# Patient Record
Sex: Female | Born: 1960
Health system: Southern US, Community
[De-identification: ages and names within clinical notes are randomized; demographics above are authoritative.]

## PROBLEM LIST (undated history)

## (undated) DIAGNOSIS — C329 Malignant neoplasm of larynx, unspecified: Secondary | ICD-10-CM

## (undated) DIAGNOSIS — C3431 Malignant neoplasm of lower lobe, right bronchus or lung: Secondary | ICD-10-CM

## (undated) DIAGNOSIS — F32A Depression, unspecified: Secondary | ICD-10-CM

## (undated) DIAGNOSIS — G43909 Migraine, unspecified, not intractable, without status migrainosus: Secondary | ICD-10-CM

## (undated) DIAGNOSIS — G8929 Other chronic pain: Secondary | ICD-10-CM

## (undated) DIAGNOSIS — R32 Unspecified urinary incontinence: Secondary | ICD-10-CM

## (undated) DIAGNOSIS — I10 Essential (primary) hypertension: Secondary | ICD-10-CM

## (undated) DIAGNOSIS — R238 Other skin changes: Secondary | ICD-10-CM

## (undated) DIAGNOSIS — M1990 Unspecified osteoarthritis, unspecified site: Secondary | ICD-10-CM

## (undated) DIAGNOSIS — T148XXA Other injury of unspecified body region, initial encounter: Secondary | ICD-10-CM

## (undated) DIAGNOSIS — K649 Unspecified hemorrhoids: Secondary | ICD-10-CM

## (undated) DIAGNOSIS — D649 Anemia, unspecified: Secondary | ICD-10-CM

## (undated) DIAGNOSIS — R0600 Dyspnea, unspecified: Secondary | ICD-10-CM

## (undated) DIAGNOSIS — G3184 Mild cognitive impairment, so stated: Secondary | ICD-10-CM

## (undated) DIAGNOSIS — C801 Malignant (primary) neoplasm, unspecified: Secondary | ICD-10-CM

## (undated) DIAGNOSIS — G479 Sleep disorder, unspecified: Secondary | ICD-10-CM

## (undated) DIAGNOSIS — J45909 Unspecified asthma, uncomplicated: Secondary | ICD-10-CM

## (undated) DIAGNOSIS — G43009 Migraine without aura, not intractable, without status migrainosus: Secondary | ICD-10-CM

## (undated) DIAGNOSIS — M79606 Pain in leg, unspecified: Secondary | ICD-10-CM

## (undated) DIAGNOSIS — K5792 Diverticulitis of intestine, part unspecified, without perforation or abscess without bleeding: Secondary | ICD-10-CM

## (undated) DIAGNOSIS — Z972 Presence of dental prosthetic device (complete) (partial): Secondary | ICD-10-CM

## (undated) DIAGNOSIS — F419 Anxiety disorder, unspecified: Secondary | ICD-10-CM

## (undated) DIAGNOSIS — E785 Hyperlipidemia, unspecified: Secondary | ICD-10-CM

## (undated) DIAGNOSIS — C3490 Malignant neoplasm of unspecified part of unspecified bronchus or lung: Secondary | ICD-10-CM

## (undated) DIAGNOSIS — J449 Chronic obstructive pulmonary disease, unspecified: Secondary | ICD-10-CM

## (undated) DIAGNOSIS — R233 Spontaneous ecchymoses: Secondary | ICD-10-CM

## (undated) DIAGNOSIS — F329 Major depressive disorder, single episode, unspecified: Secondary | ICD-10-CM

## (undated) DIAGNOSIS — Z87442 Personal history of urinary calculi: Secondary | ICD-10-CM

## (undated) DIAGNOSIS — R911 Solitary pulmonary nodule: Secondary | ICD-10-CM

## (undated) DIAGNOSIS — Z8614 Personal history of Methicillin resistant Staphylococcus aureus infection: Secondary | ICD-10-CM

## (undated) DIAGNOSIS — M47816 Spondylosis without myelopathy or radiculopathy, lumbar region: Secondary | ICD-10-CM

## (undated) DIAGNOSIS — I499 Cardiac arrhythmia, unspecified: Secondary | ICD-10-CM

## (undated) DIAGNOSIS — K219 Gastro-esophageal reflux disease without esophagitis: Secondary | ICD-10-CM

## (undated) DIAGNOSIS — R0902 Hypoxemia: Secondary | ICD-10-CM

## (undated) DIAGNOSIS — M549 Dorsalgia, unspecified: Secondary | ICD-10-CM

## (undated) HISTORY — DX: Other injury of unspecified body region, initial encounter: T14.8XXA

## (undated) HISTORY — DX: Unspecified hemorrhoids: K64.9

## (undated) HISTORY — PX: TONSILLECTOMY: SUR1361

## (undated) HISTORY — DX: Spondylosis without myelopathy or radiculopathy, lumbar region: M47.816

## (undated) HISTORY — DX: Migraine without aura, not intractable, without status migrainosus: G43.009

## (undated) HISTORY — PX: JOINT REPLACEMENT: SHX530

## (undated) HISTORY — DX: Unspecified asthma, uncomplicated: J45.909

## (undated) HISTORY — DX: Hyperlipidemia, unspecified: E78.5

## (undated) HISTORY — DX: Migraine, unspecified, not intractable, without status migrainosus: G43.909

## (undated) HISTORY — DX: Diverticulitis of intestine, part unspecified, without perforation or abscess without bleeding: K57.92

## (undated) HISTORY — DX: Solitary pulmonary nodule: R91.1

## (undated) HISTORY — PX: COLONOSCOPY: SHX5424

## (undated) HISTORY — DX: Essential (primary) hypertension: I10

## (undated) HISTORY — DX: Personal history of Methicillin resistant Staphylococcus aureus infection: Z86.14

## (undated) HISTORY — DX: Malignant neoplasm of larynx, unspecified: C32.9

## (undated) HISTORY — DX: Mild cognitive impairment, so stated: G31.84

---

## 1981-10-19 HISTORY — PX: ABDOMINAL HYSTERECTOMY: SHX81

## 1996-10-19 HISTORY — PX: CHOLECYSTECTOMY: SHX55

## 2003-09-28 ENCOUNTER — Other Ambulatory Visit: Payer: Self-pay

## 2004-08-04 ENCOUNTER — Ambulatory Visit: Payer: Self-pay | Admitting: Pain Medicine

## 2004-09-02 ENCOUNTER — Ambulatory Visit: Payer: Self-pay | Admitting: Pain Medicine

## 2004-09-24 ENCOUNTER — Ambulatory Visit: Payer: Self-pay | Admitting: Pain Medicine

## 2004-10-30 ENCOUNTER — Ambulatory Visit: Payer: Self-pay | Admitting: Pain Medicine

## 2004-11-12 ENCOUNTER — Ambulatory Visit: Payer: Self-pay | Admitting: Pain Medicine

## 2004-11-27 ENCOUNTER — Ambulatory Visit: Payer: Self-pay | Admitting: Pain Medicine

## 2004-12-01 ENCOUNTER — Ambulatory Visit: Payer: Self-pay | Admitting: Pain Medicine

## 2004-12-24 ENCOUNTER — Ambulatory Visit: Payer: Self-pay | Admitting: Pain Medicine

## 2005-01-21 ENCOUNTER — Ambulatory Visit: Payer: Self-pay | Admitting: Pain Medicine

## 2005-02-03 ENCOUNTER — Ambulatory Visit: Payer: Self-pay | Admitting: Pain Medicine

## 2005-02-17 ENCOUNTER — Ambulatory Visit: Payer: Self-pay | Admitting: Pain Medicine

## 2005-02-25 ENCOUNTER — Ambulatory Visit: Payer: Self-pay | Admitting: Pain Medicine

## 2005-03-31 ENCOUNTER — Ambulatory Visit: Payer: Self-pay | Admitting: Pain Medicine

## 2005-04-06 ENCOUNTER — Ambulatory Visit: Payer: Self-pay | Admitting: Pain Medicine

## 2005-04-09 ENCOUNTER — Emergency Department: Payer: Self-pay | Admitting: General Practice

## 2005-04-10 ENCOUNTER — Other Ambulatory Visit: Payer: Self-pay

## 2005-04-28 ENCOUNTER — Ambulatory Visit: Payer: Self-pay | Admitting: Pain Medicine

## 2005-05-06 ENCOUNTER — Ambulatory Visit: Payer: Self-pay | Admitting: Pain Medicine

## 2005-05-28 ENCOUNTER — Ambulatory Visit: Payer: Self-pay | Admitting: Pain Medicine

## 2005-06-08 ENCOUNTER — Ambulatory Visit: Payer: Self-pay | Admitting: Pain Medicine

## 2005-07-21 ENCOUNTER — Ambulatory Visit: Payer: Self-pay | Admitting: Pain Medicine

## 2005-07-27 ENCOUNTER — Ambulatory Visit: Payer: Self-pay | Admitting: Pain Medicine

## 2005-08-20 ENCOUNTER — Ambulatory Visit: Payer: Self-pay | Admitting: Pain Medicine

## 2005-08-26 ENCOUNTER — Ambulatory Visit: Payer: Self-pay | Admitting: Pain Medicine

## 2005-09-03 ENCOUNTER — Ambulatory Visit: Payer: Self-pay | Admitting: Pain Medicine

## 2005-09-28 ENCOUNTER — Ambulatory Visit: Payer: Self-pay | Admitting: Pain Medicine

## 2005-10-29 ENCOUNTER — Ambulatory Visit: Payer: Self-pay | Admitting: Pain Medicine

## 2005-11-04 ENCOUNTER — Emergency Department: Payer: Self-pay | Admitting: Unknown Physician Specialty

## 2005-11-04 ENCOUNTER — Ambulatory Visit: Payer: Self-pay | Admitting: Pain Medicine

## 2005-11-04 ENCOUNTER — Other Ambulatory Visit: Payer: Self-pay

## 2005-11-14 ENCOUNTER — Emergency Department: Payer: Self-pay | Admitting: Emergency Medicine

## 2005-11-15 ENCOUNTER — Other Ambulatory Visit: Payer: Self-pay

## 2005-11-24 ENCOUNTER — Ambulatory Visit: Payer: Self-pay | Admitting: Pain Medicine

## 2005-11-30 ENCOUNTER — Ambulatory Visit: Payer: Self-pay | Admitting: Pain Medicine

## 2006-01-13 ENCOUNTER — Inpatient Hospital Stay: Payer: Self-pay | Admitting: Psychiatry

## 2006-01-29 ENCOUNTER — Encounter: Payer: Self-pay | Admitting: Psychiatry

## 2006-02-04 ENCOUNTER — Ambulatory Visit: Payer: Self-pay | Admitting: Pain Medicine

## 2006-02-15 ENCOUNTER — Ambulatory Visit: Payer: Self-pay | Admitting: Pain Medicine

## 2006-03-10 ENCOUNTER — Ambulatory Visit: Payer: Self-pay | Admitting: Pain Medicine

## 2006-03-24 ENCOUNTER — Ambulatory Visit: Payer: Self-pay | Admitting: Pain Medicine

## 2006-04-14 ENCOUNTER — Emergency Department: Payer: Self-pay | Admitting: Emergency Medicine

## 2006-04-17 ENCOUNTER — Observation Stay: Payer: Self-pay | Admitting: Internal Medicine

## 2006-06-07 ENCOUNTER — Ambulatory Visit: Payer: Self-pay | Admitting: Pain Medicine

## 2006-06-28 ENCOUNTER — Emergency Department: Payer: Self-pay | Admitting: Emergency Medicine

## 2006-07-13 ENCOUNTER — Ambulatory Visit: Payer: Self-pay | Admitting: Pain Medicine

## 2006-07-28 ENCOUNTER — Ambulatory Visit: Payer: Self-pay | Admitting: Pain Medicine

## 2006-08-24 ENCOUNTER — Ambulatory Visit: Payer: Self-pay | Admitting: Pain Medicine

## 2006-08-30 ENCOUNTER — Ambulatory Visit: Payer: Self-pay | Admitting: Pain Medicine

## 2006-09-21 ENCOUNTER — Ambulatory Visit: Payer: Self-pay | Admitting: Pain Medicine

## 2006-09-27 ENCOUNTER — Ambulatory Visit: Payer: Self-pay | Admitting: Pain Medicine

## 2006-10-14 ENCOUNTER — Emergency Department: Payer: Self-pay | Admitting: Emergency Medicine

## 2006-10-21 ENCOUNTER — Ambulatory Visit: Payer: Self-pay | Admitting: Pain Medicine

## 2006-10-23 ENCOUNTER — Emergency Department: Payer: Self-pay | Admitting: General Practice

## 2006-10-27 ENCOUNTER — Ambulatory Visit: Payer: Self-pay | Admitting: Pain Medicine

## 2006-11-18 ENCOUNTER — Ambulatory Visit: Payer: Self-pay | Admitting: Pain Medicine

## 2006-11-29 ENCOUNTER — Ambulatory Visit: Payer: Self-pay | Admitting: Pain Medicine

## 2006-12-10 ENCOUNTER — Emergency Department: Payer: Self-pay | Admitting: General Practice

## 2006-12-16 ENCOUNTER — Emergency Department: Payer: Self-pay | Admitting: Emergency Medicine

## 2006-12-16 ENCOUNTER — Ambulatory Visit: Payer: Self-pay | Admitting: Pain Medicine

## 2006-12-29 ENCOUNTER — Ambulatory Visit: Payer: Self-pay | Admitting: Pain Medicine

## 2007-01-13 ENCOUNTER — Ambulatory Visit: Payer: Self-pay | Admitting: Pain Medicine

## 2007-01-19 ENCOUNTER — Ambulatory Visit: Payer: Self-pay | Admitting: Pain Medicine

## 2007-02-15 ENCOUNTER — Ambulatory Visit: Payer: Self-pay | Admitting: Pain Medicine

## 2007-02-21 ENCOUNTER — Ambulatory Visit: Payer: Self-pay | Admitting: Pain Medicine

## 2007-03-17 ENCOUNTER — Ambulatory Visit: Payer: Self-pay | Admitting: Pain Medicine

## 2007-03-23 ENCOUNTER — Ambulatory Visit: Payer: Self-pay | Admitting: Pain Medicine

## 2007-04-14 ENCOUNTER — Ambulatory Visit: Payer: Self-pay | Admitting: Pain Medicine

## 2007-04-18 ENCOUNTER — Ambulatory Visit: Payer: Self-pay | Admitting: Pain Medicine

## 2007-05-03 ENCOUNTER — Emergency Department: Payer: Self-pay | Admitting: Emergency Medicine

## 2007-05-09 ENCOUNTER — Emergency Department: Payer: Self-pay | Admitting: Emergency Medicine

## 2007-05-17 ENCOUNTER — Ambulatory Visit: Payer: Self-pay | Admitting: Pain Medicine

## 2007-05-18 ENCOUNTER — Ambulatory Visit: Payer: Self-pay | Admitting: Pain Medicine

## 2007-05-21 ENCOUNTER — Emergency Department: Payer: Self-pay | Admitting: Emergency Medicine

## 2007-05-26 ENCOUNTER — Emergency Department: Payer: Self-pay | Admitting: Internal Medicine

## 2007-06-16 ENCOUNTER — Ambulatory Visit: Payer: Self-pay | Admitting: Pain Medicine

## 2007-06-22 ENCOUNTER — Emergency Department: Payer: Self-pay | Admitting: Emergency Medicine

## 2007-07-13 ENCOUNTER — Ambulatory Visit: Payer: Self-pay | Admitting: Pain Medicine

## 2007-07-25 ENCOUNTER — Ambulatory Visit: Payer: Self-pay | Admitting: Pain Medicine

## 2007-08-11 ENCOUNTER — Ambulatory Visit: Payer: Self-pay | Admitting: Pain Medicine

## 2007-08-22 ENCOUNTER — Ambulatory Visit: Payer: Self-pay | Admitting: Pain Medicine

## 2007-09-13 ENCOUNTER — Ambulatory Visit: Payer: Self-pay | Admitting: Pain Medicine

## 2007-09-21 ENCOUNTER — Ambulatory Visit: Payer: Self-pay | Admitting: Pain Medicine

## 2007-10-20 DIAGNOSIS — C329 Malignant neoplasm of larynx, unspecified: Secondary | ICD-10-CM

## 2007-10-20 HISTORY — DX: Malignant neoplasm of larynx, unspecified: C32.9

## 2007-10-26 ENCOUNTER — Ambulatory Visit: Payer: Self-pay | Admitting: Pain Medicine

## 2007-11-24 ENCOUNTER — Ambulatory Visit: Payer: Self-pay | Admitting: Pain Medicine

## 2007-11-28 ENCOUNTER — Ambulatory Visit: Payer: Self-pay | Admitting: Pain Medicine

## 2007-12-22 ENCOUNTER — Ambulatory Visit: Payer: Self-pay | Admitting: Pain Medicine

## 2007-12-28 ENCOUNTER — Ambulatory Visit: Payer: Self-pay | Admitting: Pain Medicine

## 2008-01-15 ENCOUNTER — Other Ambulatory Visit: Payer: Self-pay

## 2008-01-15 ENCOUNTER — Emergency Department: Payer: Self-pay | Admitting: Emergency Medicine

## 2008-01-23 ENCOUNTER — Ambulatory Visit: Payer: Self-pay | Admitting: Pain Medicine

## 2008-02-12 ENCOUNTER — Ambulatory Visit: Payer: Self-pay | Admitting: Internal Medicine

## 2008-02-15 ENCOUNTER — Ambulatory Visit: Payer: Self-pay | Admitting: Gastroenterology

## 2008-02-23 ENCOUNTER — Ambulatory Visit: Payer: Self-pay | Admitting: Pain Medicine

## 2008-02-29 ENCOUNTER — Ambulatory Visit: Payer: Self-pay | Admitting: Pain Medicine

## 2008-03-15 ENCOUNTER — Ambulatory Visit: Payer: Self-pay | Admitting: Pain Medicine

## 2008-03-21 ENCOUNTER — Ambulatory Visit: Payer: Self-pay | Admitting: Pain Medicine

## 2008-04-01 ENCOUNTER — Other Ambulatory Visit: Payer: Self-pay

## 2008-04-01 ENCOUNTER — Emergency Department: Payer: Self-pay | Admitting: Emergency Medicine

## 2008-04-02 ENCOUNTER — Other Ambulatory Visit: Payer: Self-pay

## 2008-04-02 ENCOUNTER — Emergency Department: Payer: Self-pay | Admitting: Emergency Medicine

## 2008-04-15 ENCOUNTER — Emergency Department: Payer: Self-pay | Admitting: Emergency Medicine

## 2008-04-17 ENCOUNTER — Ambulatory Visit: Payer: Self-pay | Admitting: Pain Medicine

## 2008-04-17 ENCOUNTER — Ambulatory Visit: Payer: Self-pay | Admitting: Internal Medicine

## 2008-04-19 ENCOUNTER — Ambulatory Visit: Payer: Self-pay | Admitting: Internal Medicine

## 2008-04-25 ENCOUNTER — Ambulatory Visit: Payer: Self-pay | Admitting: Pain Medicine

## 2008-05-15 ENCOUNTER — Ambulatory Visit: Payer: Self-pay | Admitting: Pain Medicine

## 2008-05-16 ENCOUNTER — Ambulatory Visit: Payer: Self-pay | Admitting: Pain Medicine

## 2008-05-24 ENCOUNTER — Other Ambulatory Visit: Payer: Self-pay

## 2008-05-24 ENCOUNTER — Emergency Department: Payer: Self-pay | Admitting: Emergency Medicine

## 2008-06-14 ENCOUNTER — Ambulatory Visit: Payer: Self-pay | Admitting: Pain Medicine

## 2008-06-18 ENCOUNTER — Ambulatory Visit: Payer: Self-pay | Admitting: Pain Medicine

## 2008-07-17 ENCOUNTER — Ambulatory Visit: Payer: Self-pay | Admitting: Pain Medicine

## 2008-07-18 ENCOUNTER — Ambulatory Visit: Payer: Self-pay | Admitting: Pain Medicine

## 2008-08-16 ENCOUNTER — Ambulatory Visit: Payer: Self-pay | Admitting: Pain Medicine

## 2008-08-20 ENCOUNTER — Ambulatory Visit: Payer: Self-pay | Admitting: Pain Medicine

## 2008-09-10 ENCOUNTER — Ambulatory Visit: Payer: Self-pay | Admitting: Pain Medicine

## 2008-10-17 ENCOUNTER — Ambulatory Visit: Payer: Self-pay | Admitting: Pain Medicine

## 2008-11-14 ENCOUNTER — Ambulatory Visit: Payer: Self-pay | Admitting: Pain Medicine

## 2008-11-27 ENCOUNTER — Emergency Department: Payer: Self-pay | Admitting: Internal Medicine

## 2008-12-03 ENCOUNTER — Ambulatory Visit: Payer: Self-pay | Admitting: Pain Medicine

## 2008-12-12 ENCOUNTER — Ambulatory Visit: Payer: Self-pay | Admitting: Pain Medicine

## 2008-12-24 ENCOUNTER — Ambulatory Visit: Payer: Self-pay | Admitting: Pain Medicine

## 2009-01-27 ENCOUNTER — Inpatient Hospital Stay: Payer: Self-pay | Admitting: Internal Medicine

## 2009-01-29 ENCOUNTER — Inpatient Hospital Stay: Payer: Self-pay | Admitting: Unknown Physician Specialty

## 2009-02-15 ENCOUNTER — Inpatient Hospital Stay: Payer: Self-pay | Admitting: Psychiatry

## 2009-02-26 ENCOUNTER — Ambulatory Visit: Payer: Self-pay | Admitting: Unknown Physician Specialty

## 2009-03-07 ENCOUNTER — Ambulatory Visit: Payer: Self-pay | Admitting: Unknown Physician Specialty

## 2009-03-10 ENCOUNTER — Observation Stay: Payer: Self-pay | Admitting: Psychiatry

## 2009-04-29 ENCOUNTER — Inpatient Hospital Stay: Payer: Self-pay | Admitting: Internal Medicine

## 2009-04-30 ENCOUNTER — Inpatient Hospital Stay: Payer: Self-pay | Admitting: Unknown Physician Specialty

## 2009-05-12 ENCOUNTER — Emergency Department: Payer: Self-pay | Admitting: Emergency Medicine

## 2009-05-23 ENCOUNTER — Emergency Department: Payer: Self-pay | Admitting: Emergency Medicine

## 2009-05-29 ENCOUNTER — Inpatient Hospital Stay: Payer: Self-pay | Admitting: Internal Medicine

## 2009-06-03 ENCOUNTER — Emergency Department: Payer: Self-pay | Admitting: Emergency Medicine

## 2009-07-23 ENCOUNTER — Ambulatory Visit: Payer: Self-pay | Admitting: Orthopedic Surgery

## 2009-08-18 ENCOUNTER — Emergency Department: Payer: Self-pay | Admitting: Emergency Medicine

## 2009-09-16 ENCOUNTER — Ambulatory Visit: Payer: Self-pay | Admitting: Orthopedic Surgery

## 2009-09-26 ENCOUNTER — Inpatient Hospital Stay: Payer: Self-pay | Admitting: Orthopedic Surgery

## 2009-12-17 ENCOUNTER — Ambulatory Visit: Payer: Self-pay | Admitting: Internal Medicine

## 2009-12-26 ENCOUNTER — Ambulatory Visit: Payer: Self-pay | Admitting: Otolaryngology

## 2010-01-01 ENCOUNTER — Ambulatory Visit: Payer: Self-pay | Admitting: Otolaryngology

## 2010-01-10 ENCOUNTER — Ambulatory Visit: Payer: Self-pay | Admitting: Internal Medicine

## 2010-01-13 ENCOUNTER — Ambulatory Visit: Payer: Self-pay | Admitting: Vascular Surgery

## 2010-01-15 ENCOUNTER — Ambulatory Visit: Payer: Self-pay | Admitting: Internal Medicine

## 2010-01-17 ENCOUNTER — Ambulatory Visit: Payer: Self-pay | Admitting: Internal Medicine

## 2010-02-16 ENCOUNTER — Ambulatory Visit: Payer: Self-pay | Admitting: Internal Medicine

## 2010-03-16 ENCOUNTER — Emergency Department: Payer: Self-pay | Admitting: Emergency Medicine

## 2010-03-19 ENCOUNTER — Ambulatory Visit: Payer: Self-pay | Admitting: Internal Medicine

## 2010-03-29 ENCOUNTER — Emergency Department: Payer: Self-pay | Admitting: Unknown Physician Specialty

## 2010-04-18 ENCOUNTER — Ambulatory Visit: Payer: Self-pay | Admitting: Internal Medicine

## 2010-05-19 ENCOUNTER — Ambulatory Visit: Payer: Self-pay | Admitting: Oncology

## 2010-05-20 ENCOUNTER — Ambulatory Visit: Payer: Self-pay | Admitting: Internal Medicine

## 2010-05-27 ENCOUNTER — Ambulatory Visit: Payer: Self-pay | Admitting: Internal Medicine

## 2010-05-30 ENCOUNTER — Ambulatory Visit: Payer: Self-pay | Admitting: Vascular Surgery

## 2010-06-10 ENCOUNTER — Ambulatory Visit: Payer: Self-pay | Admitting: Nephrology

## 2010-06-19 ENCOUNTER — Ambulatory Visit: Payer: Self-pay | Admitting: Oncology

## 2010-06-19 ENCOUNTER — Ambulatory Visit: Payer: Self-pay | Admitting: Internal Medicine

## 2010-06-20 ENCOUNTER — Ambulatory Visit: Payer: Self-pay | Admitting: Orthopedic Surgery

## 2010-06-26 ENCOUNTER — Inpatient Hospital Stay: Payer: Self-pay | Admitting: Orthopedic Surgery

## 2010-07-28 ENCOUNTER — Ambulatory Visit: Payer: Self-pay | Admitting: Family Medicine

## 2010-09-03 ENCOUNTER — Ambulatory Visit: Payer: Self-pay | Admitting: Internal Medicine

## 2010-09-18 ENCOUNTER — Ambulatory Visit: Payer: Self-pay | Admitting: Internal Medicine

## 2010-10-16 ENCOUNTER — Inpatient Hospital Stay: Payer: Self-pay | Admitting: Internal Medicine

## 2010-10-19 ENCOUNTER — Ambulatory Visit: Payer: Self-pay | Admitting: Internal Medicine

## 2010-11-11 ENCOUNTER — Ambulatory Visit: Payer: Self-pay | Admitting: Gastroenterology

## 2010-11-11 LAB — HM COLONOSCOPY

## 2010-11-12 ENCOUNTER — Ambulatory Visit: Payer: Self-pay | Admitting: Gastroenterology

## 2010-11-19 ENCOUNTER — Ambulatory Visit: Payer: Self-pay | Admitting: Internal Medicine

## 2010-12-18 ENCOUNTER — Ambulatory Visit: Payer: Self-pay | Admitting: Internal Medicine

## 2011-01-22 ENCOUNTER — Ambulatory Visit: Payer: Self-pay | Admitting: Nephrology

## 2011-02-02 ENCOUNTER — Ambulatory Visit: Payer: Self-pay | Admitting: Internal Medicine

## 2011-02-17 ENCOUNTER — Ambulatory Visit: Payer: Self-pay | Admitting: Internal Medicine

## 2011-03-04 ENCOUNTER — Ambulatory Visit: Payer: Self-pay | Admitting: Nephrology

## 2011-03-20 ENCOUNTER — Ambulatory Visit: Payer: Self-pay | Admitting: Internal Medicine

## 2011-05-26 ENCOUNTER — Ambulatory Visit: Payer: Self-pay | Admitting: Orthopedic Surgery

## 2011-05-29 ENCOUNTER — Ambulatory Visit: Payer: Self-pay | Admitting: Orthopedic Surgery

## 2011-07-01 ENCOUNTER — Ambulatory Visit: Payer: Self-pay | Admitting: Internal Medicine

## 2011-07-20 ENCOUNTER — Ambulatory Visit: Payer: Self-pay | Admitting: Internal Medicine

## 2011-07-22 ENCOUNTER — Ambulatory Visit: Payer: Self-pay | Admitting: Gastroenterology

## 2011-07-23 ENCOUNTER — Ambulatory Visit: Payer: Self-pay | Admitting: Gastroenterology

## 2011-07-24 LAB — PATHOLOGY REPORT

## 2011-07-29 ENCOUNTER — Ambulatory Visit: Payer: Self-pay | Admitting: Otolaryngology

## 2011-08-25 ENCOUNTER — Ambulatory Visit: Payer: Self-pay | Admitting: Gastroenterology

## 2011-09-09 ENCOUNTER — Ambulatory Visit: Payer: Self-pay | Admitting: Cardiology

## 2011-11-19 ENCOUNTER — Ambulatory Visit: Payer: Self-pay | Admitting: Orthopedic Surgery

## 2011-11-19 LAB — BASIC METABOLIC PANEL
Calcium, Total: 8.3 mg/dL — ABNORMAL LOW (ref 8.5–10.1)
Chloride: 105 mmol/L (ref 98–107)
Co2: 28 mmol/L (ref 21–32)
Creatinine: 0.87 mg/dL (ref 0.60–1.30)
EGFR (African American): >60
Glucose: 101 mg/dL — ABNORMAL HIGH (ref 65–99)
Osmolality: 287 (ref 275–301)
Potassium: 3.6 mmol/L (ref 3.5–5.1)

## 2011-11-19 LAB — SEDIMENTATION RATE: Erythrocyte Sed Rate: 16 mm/hr (ref 0–30)

## 2011-11-19 LAB — APTT: Activated PTT: 29.1 secs (ref 23.6–35.9)

## 2011-11-19 LAB — PROTIME-INR
INR: 1
Prothrombin Time: 13.1 secs (ref 11.5–14.7)

## 2011-11-19 LAB — CBC
HCT: 35 % (ref 35.0–47.0)
MCH: 30.2 pg (ref 26.0–34.0)
MCHC: 33.6 g/dL (ref 32.0–36.0)
MCV: 90 fL (ref 80–100)
RDW: 13.5 % (ref 11.5–14.5)

## 2011-11-19 LAB — MRSA PCR SCREENING

## 2011-11-26 ENCOUNTER — Inpatient Hospital Stay: Payer: Self-pay | Admitting: Orthopedic Surgery

## 2011-11-27 LAB — BASIC METABOLIC PANEL
Anion Gap: 9 (ref 7–16)
BUN: 11 mg/dL (ref 7–18)
Chloride: 105 mmol/L (ref 98–107)
Co2: 27 mmol/L (ref 21–32)
Osmolality: 280 (ref 275–301)
Potassium: 4.7 mmol/L (ref 3.5–5.1)
Sodium: 141 mmol/L (ref 136–145)

## 2011-11-27 LAB — PLATELET COUNT: Platelet: 175 10*3/uL (ref 150–440)

## 2011-11-27 LAB — HEMOGLOBIN: HGB: 11.7 g/dL — ABNORMAL LOW (ref 12.0–16.0)

## 2011-11-30 LAB — WOUND CULTURE

## 2012-01-05 ENCOUNTER — Ambulatory Visit: Payer: Self-pay | Admitting: Oncology

## 2012-01-05 LAB — COMPREHENSIVE METABOLIC PANEL
Albumin: 3.4 g/dL (ref 3.4–5.0)
BUN: 11 mg/dL (ref 7–18)
Bilirubin,Total: 0.2 mg/dL (ref 0.2–1.0)
Chloride: 103 mmol/L (ref 98–107)
Creatinine: 0.86 mg/dL (ref 0.60–1.30)
EGFR (African American): >60
Glucose: 134 mg/dL — ABNORMAL HIGH (ref 65–99)
Osmolality: 281 (ref 275–301)
SGOT(AST): 26 U/L (ref 15–37)
SGPT (ALT): 43 U/L
Sodium: 140 mmol/L (ref 136–145)
Total Protein: 7.3 g/dL (ref 6.4–8.2)

## 2012-01-05 LAB — CBC CANCER CENTER
Basophil #: 0 x10 3/mm (ref 0.0–0.1)
Eosinophil %: 1.2 %
HGB: 11.9 g/dL — ABNORMAL LOW (ref 12.0–16.0)
Lymphocyte #: 1.6 x10 3/mm (ref 1.0–3.6)
MCV: 90 fL (ref 80–100)
Monocyte %: 6.7 %
Neutrophil %: 65.8 %
Platelet: 234 x10 3/mm (ref 150–440)
RBC: 3.89 10*6/uL (ref 3.80–5.20)
RDW: 14.6 % — ABNORMAL HIGH (ref 11.5–14.5)
WBC: 6.1 x10 3/mm (ref 3.6–11.0)

## 2012-01-18 ENCOUNTER — Ambulatory Visit: Payer: Self-pay | Admitting: Oncology

## 2012-03-04 ENCOUNTER — Ambulatory Visit: Payer: Self-pay | Admitting: Oncology

## 2012-04-25 ENCOUNTER — Ambulatory Visit: Payer: Self-pay | Admitting: Oncology

## 2012-05-09 ENCOUNTER — Ambulatory Visit: Payer: Self-pay | Admitting: Oncology

## 2012-05-19 ENCOUNTER — Ambulatory Visit: Payer: Self-pay | Admitting: Oncology

## 2012-07-05 ENCOUNTER — Ambulatory Visit: Payer: Self-pay | Admitting: Pain Medicine

## 2012-07-13 ENCOUNTER — Ambulatory Visit: Payer: Self-pay | Admitting: Pain Medicine

## 2012-07-20 ENCOUNTER — Ambulatory Visit: Payer: Self-pay | Admitting: Pain Medicine

## 2012-08-16 ENCOUNTER — Ambulatory Visit: Payer: Self-pay | Admitting: Pain Medicine

## 2012-08-19 ENCOUNTER — Emergency Department: Payer: Self-pay | Admitting: Emergency Medicine

## 2012-08-19 LAB — COMPREHENSIVE METABOLIC PANEL
Albumin: 3.4 g/dL (ref 3.4–5.0)
Anion Gap: 9 (ref 7–16)
BUN: 13 mg/dL (ref 7–18)
Bilirubin,Total: 0.2 mg/dL (ref 0.2–1.0)
Chloride: 109 mmol/L — ABNORMAL HIGH (ref 98–107)
Co2: 24 mmol/L (ref 21–32)
Creatinine: 1.01 mg/dL (ref 0.60–1.30)
EGFR (African American): >60
EGFR (Non-African Amer.): >60
Glucose: 99 mg/dL (ref 65–99)
Osmolality: 283 (ref 275–301)
SGPT (ALT): 43 U/L (ref 12–78)

## 2012-08-19 LAB — CBC
MCH: 29.9 pg (ref 26.0–34.0)
MCHC: 34 g/dL (ref 32.0–36.0)
MCV: 88 fL (ref 80–100)
Platelet: 181 10*3/uL (ref 150–440)
RBC: 4.05 10*6/uL (ref 3.80–5.20)
RDW: 14.2 % (ref 11.5–14.5)
WBC: 6.4 10*3/uL (ref 3.6–11.0)

## 2012-08-24 ENCOUNTER — Ambulatory Visit: Payer: Self-pay | Admitting: Pain Medicine

## 2012-09-22 ENCOUNTER — Ambulatory Visit: Payer: Self-pay | Admitting: Pain Medicine

## 2012-10-03 ENCOUNTER — Ambulatory Visit: Payer: Self-pay | Admitting: Pain Medicine

## 2012-10-19 HISTORY — PX: BLADDER SUSPENSION: SHX72

## 2012-11-01 ENCOUNTER — Ambulatory Visit: Payer: Self-pay | Admitting: Pain Medicine

## 2012-11-03 ENCOUNTER — Emergency Department: Payer: Self-pay | Admitting: Internal Medicine

## 2012-11-03 LAB — COMPREHENSIVE METABOLIC PANEL
Albumin: 3.5 g/dL (ref 3.4–5.0)
BUN: 13 mg/dL (ref 7–18)
Bilirubin,Total: 0.4 mg/dL (ref 0.2–1.0)
Chloride: 108 mmol/L — ABNORMAL HIGH (ref 98–107)
Creatinine: 0.82 mg/dL (ref 0.60–1.30)
EGFR (Non-African Amer.): >60
Glucose: 88 mg/dL (ref 65–99)
Potassium: 3.5 mmol/L (ref 3.5–5.1)
SGOT(AST): 33 U/L (ref 15–37)
Sodium: 140 mmol/L (ref 136–145)
Total Protein: 7.2 g/dL (ref 6.4–8.2)

## 2012-11-03 LAB — URINALYSIS, COMPLETE
Bacteria: NONE SEEN
Glucose,UR: NEGATIVE mg/dL (ref 0–75)
Ketone: NEGATIVE
Ph: 5 (ref 4.5–8.0)
RBC,UR: 1 /HPF (ref 0–5)
WBC UR: 1 /HPF (ref 0–5)

## 2012-11-03 LAB — CBC
MCV: 89 fL (ref 80–100)
RBC: 4.24 10*6/uL (ref 3.80–5.20)

## 2012-11-04 ENCOUNTER — Emergency Department: Payer: Self-pay | Admitting: Emergency Medicine

## 2012-11-04 LAB — URINALYSIS, COMPLETE
Bacteria: NONE SEEN
Bilirubin,UR: NEGATIVE
Glucose,UR: 50 mg/dL (ref 0–75)
Ketone: NEGATIVE
Leukocyte Esterase: NEGATIVE
Nitrite: POSITIVE
Ph: 5 (ref 4.5–8.0)
RBC,UR: 23081 /HPF (ref 0–5)
Specific Gravity: 1.028 (ref 1.003–1.030)
WBC UR: 89 /HPF (ref 0–5)

## 2012-11-05 LAB — URINE CULTURE

## 2012-11-06 ENCOUNTER — Emergency Department: Payer: Self-pay | Admitting: Emergency Medicine

## 2012-11-06 LAB — URINALYSIS, COMPLETE
Bilirubin,UR: NEGATIVE
Glucose,UR: 50 mg/dL (ref 0–75)
Leukocyte Esterase: NEGATIVE
Ph: 5 (ref 4.5–8.0)
RBC,UR: <1 /HPF (ref 0–5)
Specific Gravity: 1.026 (ref 1.003–1.030)
Squamous Epithelial: NONE SEEN
WBC UR: NONE SEEN /HPF (ref 0–5)

## 2012-11-09 ENCOUNTER — Ambulatory Visit: Payer: Self-pay | Admitting: Pain Medicine

## 2012-12-06 ENCOUNTER — Ambulatory Visit: Payer: Self-pay | Admitting: Pain Medicine

## 2012-12-12 ENCOUNTER — Ambulatory Visit: Payer: Self-pay | Admitting: Pain Medicine

## 2012-12-21 ENCOUNTER — Ambulatory Visit: Payer: Self-pay | Admitting: Pain Medicine

## 2012-12-28 ENCOUNTER — Ambulatory Visit: Payer: Self-pay | Admitting: Pain Medicine

## 2013-01-13 ENCOUNTER — Emergency Department: Payer: Self-pay | Admitting: Unknown Physician Specialty

## 2013-01-24 ENCOUNTER — Ambulatory Visit: Payer: Self-pay | Admitting: Pain Medicine

## 2013-02-01 ENCOUNTER — Ambulatory Visit: Payer: Self-pay | Admitting: Pain Medicine

## 2013-02-20 ENCOUNTER — Ambulatory Visit: Payer: Self-pay | Admitting: Pain Medicine

## 2013-02-22 ENCOUNTER — Ambulatory Visit: Payer: Self-pay | Admitting: Pain Medicine

## 2013-03-07 ENCOUNTER — Ambulatory Visit: Payer: Self-pay | Admitting: Pain Medicine

## 2013-03-20 ENCOUNTER — Ambulatory Visit: Payer: Self-pay | Admitting: Pain Medicine

## 2013-03-27 ENCOUNTER — Emergency Department: Payer: Self-pay | Admitting: Emergency Medicine

## 2013-04-11 ENCOUNTER — Ambulatory Visit: Payer: Self-pay | Admitting: Pain Medicine

## 2013-04-14 ENCOUNTER — Ambulatory Visit: Payer: Self-pay | Admitting: Pain Medicine

## 2013-04-24 ENCOUNTER — Ambulatory Visit: Payer: Self-pay | Admitting: Pain Medicine

## 2013-04-26 ENCOUNTER — Ambulatory Visit: Payer: Self-pay

## 2013-05-09 ENCOUNTER — Ambulatory Visit: Payer: Self-pay | Admitting: Oncology

## 2013-05-11 LAB — CBC CANCER CENTER
Basophil #: 0 x10 3/mm (ref 0.0–0.1)
Eosinophil %: 1.1 %
HCT: 34.4 % — ABNORMAL LOW (ref 35.0–47.0)
HGB: 11.9 g/dL — ABNORMAL LOW (ref 12.0–16.0)
Lymphocyte #: 1.4 x10 3/mm (ref 1.0–3.6)
Lymphocyte %: 18.6 %
MCHC: 34.6 g/dL (ref 32.0–36.0)
Monocyte %: 5.3 %

## 2013-05-11 LAB — COMPREHENSIVE METABOLIC PANEL
Albumin: 3.3 g/dL — ABNORMAL LOW (ref 3.4–5.0)
Alkaline Phosphatase: 101 U/L (ref 50–136)
Anion Gap: 10 (ref 7–16)
BUN: 8 mg/dL (ref 7–18)
Bilirubin,Total: 0.3 mg/dL (ref 0.2–1.0)
Calcium, Total: 8.7 mg/dL (ref 8.5–10.1)
Chloride: 105 mmol/L (ref 98–107)
Co2: 24 mmol/L (ref 21–32)
Creatinine: 0.89 mg/dL (ref 0.60–1.30)
Glucose: 95 mg/dL (ref 65–99)
Osmolality: 276 (ref 275–301)
SGOT(AST): 17 U/L (ref 15–37)
Sodium: 139 mmol/L (ref 136–145)
Total Protein: 6.8 g/dL (ref 6.4–8.2)

## 2013-05-16 ENCOUNTER — Ambulatory Visit: Payer: Self-pay | Admitting: Pain Medicine

## 2013-05-19 ENCOUNTER — Ambulatory Visit: Payer: Self-pay | Admitting: Oncology

## 2013-06-05 ENCOUNTER — Ambulatory Visit: Payer: Self-pay | Admitting: Pain Medicine

## 2013-06-13 ENCOUNTER — Ambulatory Visit: Payer: Self-pay | Admitting: Pain Medicine

## 2013-07-03 ENCOUNTER — Emergency Department: Payer: Self-pay | Admitting: Emergency Medicine

## 2013-07-03 LAB — URINALYSIS, COMPLETE
Bacteria: NONE SEEN
Bilirubin,UR: NEGATIVE
Glucose,UR: NEGATIVE mg/dL (ref 0–75)
Ketone: NEGATIVE
Nitrite: NEGATIVE
Protein: NEGATIVE
Squamous Epithelial: 1

## 2013-07-03 LAB — CBC
HCT: 40.8 % (ref 35.0–47.0)
HGB: 13.8 g/dL (ref 12.0–16.0)
MCHC: 33.8 g/dL (ref 32.0–36.0)
MCV: 88 fL (ref 80–100)
RBC: 4.62 10*6/uL (ref 3.80–5.20)
RDW: 14.1 % (ref 11.5–14.5)
WBC: 6.8 10*3/uL (ref 3.6–11.0)

## 2013-07-03 LAB — BASIC METABOLIC PANEL
BUN: 9 mg/dL (ref 7–18)
Chloride: 103 mmol/L (ref 98–107)
EGFR (African American): >60
EGFR (Non-African Amer.): >60
Glucose: 109 mg/dL — ABNORMAL HIGH (ref 65–99)
Osmolality: 268 (ref 275–301)
Potassium: 3.7 mmol/L (ref 3.5–5.1)
Sodium: 134 mmol/L — ABNORMAL LOW (ref 136–145)

## 2013-07-04 ENCOUNTER — Ambulatory Visit: Payer: Self-pay | Admitting: Pain Medicine

## 2013-07-12 ENCOUNTER — Ambulatory Visit: Payer: Self-pay | Admitting: Pain Medicine

## 2013-08-03 ENCOUNTER — Ambulatory Visit: Payer: Self-pay | Admitting: Pain Medicine

## 2013-08-14 ENCOUNTER — Ambulatory Visit: Payer: Self-pay | Admitting: Pain Medicine

## 2013-09-07 ENCOUNTER — Ambulatory Visit: Payer: Self-pay | Admitting: Pain Medicine

## 2013-09-21 ENCOUNTER — Ambulatory Visit: Payer: Self-pay | Admitting: Pain Medicine

## 2013-09-27 ENCOUNTER — Ambulatory Visit: Payer: Self-pay | Admitting: Obstetrics & Gynecology

## 2013-09-27 ENCOUNTER — Ambulatory Visit: Payer: Self-pay | Admitting: Pain Medicine

## 2013-09-27 LAB — CBC
HCT: 38.3 % (ref 35.0–47.0)
HGB: 13.1 g/dL (ref 12.0–16.0)
MCHC: 34.2 g/dL (ref 32.0–36.0)
MCV: 89 fL (ref 80–100)
Platelet: 185 10*3/uL (ref 150–440)
RBC: 4.3 10*6/uL (ref 3.80–5.20)
WBC: 7.7 10*3/uL (ref 3.6–11.0)

## 2013-09-27 LAB — APTT: Activated PTT: 34.3 secs (ref 23.6–35.9)

## 2013-09-27 LAB — PROTIME-INR
INR: 1.1
Prothrombin Time: 14.3 secs (ref 11.5–14.7)

## 2013-10-03 ENCOUNTER — Ambulatory Visit: Payer: Self-pay | Admitting: Obstetrics & Gynecology

## 2013-10-14 ENCOUNTER — Emergency Department: Payer: Self-pay | Admitting: Emergency Medicine

## 2013-10-14 LAB — URINALYSIS, COMPLETE
Blood: NEGATIVE
Protein: NEGATIVE
Specific Gravity: 1.024 (ref 1.003–1.030)
WBC UR: 10 /HPF (ref 0–5)

## 2013-10-19 ENCOUNTER — Emergency Department: Payer: Self-pay | Admitting: Emergency Medicine

## 2013-10-19 HISTORY — PX: ESOPHAGOGASTRODUODENOSCOPY: SHX1529

## 2013-10-19 LAB — URINALYSIS, COMPLETE
BACTERIA: NONE SEEN
Bilirubin,UR: NEGATIVE
GLUCOSE, UR: NEGATIVE mg/dL (ref 0–75)
Ketone: NEGATIVE
NITRITE: NEGATIVE
PROTEIN: NEGATIVE
Ph: 5 (ref 4.5–8.0)
Specific Gravity: 1.012 (ref 1.003–1.030)
Squamous Epithelial: 4
WBC UR: 8 /HPF (ref 0–5)

## 2013-11-02 ENCOUNTER — Ambulatory Visit: Payer: Self-pay | Admitting: Pain Medicine

## 2013-11-13 ENCOUNTER — Ambulatory Visit: Payer: Self-pay | Admitting: Pain Medicine

## 2013-11-20 ENCOUNTER — Ambulatory Visit: Payer: Self-pay | Admitting: Pain Medicine

## 2013-11-29 ENCOUNTER — Ambulatory Visit: Payer: Self-pay | Admitting: Pain Medicine

## 2013-12-13 ENCOUNTER — Ambulatory Visit: Payer: Self-pay | Admitting: Pain Medicine

## 2014-01-01 ENCOUNTER — Ambulatory Visit: Payer: Self-pay | Admitting: Pain Medicine

## 2014-01-08 ENCOUNTER — Ambulatory Visit: Payer: Self-pay | Admitting: Pain Medicine

## 2014-01-16 ENCOUNTER — Ambulatory Visit: Payer: Self-pay | Admitting: Pain Medicine

## 2014-01-19 ENCOUNTER — Emergency Department: Payer: Self-pay | Admitting: Emergency Medicine

## 2014-01-19 LAB — CBC
HCT: 40.2 % (ref 35.0–47.0)
HGB: 13.3 g/dL (ref 12.0–16.0)
MCH: 30.8 pg (ref 26.0–34.0)
MCHC: 33 g/dL (ref 32.0–36.0)
MCV: 94 fL (ref 80–100)
PLATELETS: 162 10*3/uL (ref 150–440)
RBC: 4.3 10*6/uL (ref 3.80–5.20)
RDW: 14.2 % (ref 11.5–14.5)
WBC: 6.2 10*3/uL (ref 3.6–11.0)

## 2014-01-19 LAB — COMPREHENSIVE METABOLIC PANEL
ALBUMIN: 3.3 g/dL — AB (ref 3.4–5.0)
ALT: 15 U/L (ref 12–78)
ANION GAP: 4 — AB (ref 7–16)
Alkaline Phosphatase: 88 U/L
BUN: 7 mg/dL (ref 7–18)
Bilirubin,Total: 0.3 mg/dL (ref 0.2–1.0)
CO2: 28 mmol/L (ref 21–32)
CREATININE: 0.84 mg/dL (ref 0.60–1.30)
Calcium, Total: 8.8 mg/dL (ref 8.5–10.1)
Chloride: 107 mmol/L (ref 98–107)
GLUCOSE: 89 mg/dL (ref 65–99)
OSMOLALITY: 275 (ref 275–301)
Potassium: 3.8 mmol/L (ref 3.5–5.1)
SGOT(AST): 16 U/L (ref 15–37)
Sodium: 139 mmol/L (ref 136–145)
Total Protein: 6.9 g/dL (ref 6.4–8.2)

## 2014-01-23 ENCOUNTER — Emergency Department: Payer: Self-pay | Admitting: Emergency Medicine

## 2014-01-23 LAB — CBC WITH DIFFERENTIAL/PLATELET
Basophil #: 0 10*3/uL (ref 0.0–0.1)
Basophil %: 0.9 %
EOS PCT: 1.6 %
Eosinophil #: 0.1 10*3/uL (ref 0.0–0.7)
HCT: 38.6 % (ref 35.0–47.0)
HGB: 12.8 g/dL (ref 12.0–16.0)
LYMPHS ABS: 1.3 10*3/uL (ref 1.0–3.6)
Lymphocyte %: 24.3 %
MCH: 30.8 pg (ref 26.0–34.0)
MCHC: 33.1 g/dL (ref 32.0–36.0)
MCV: 93 fL (ref 80–100)
Monocyte #: 0.3 x10 3/mm (ref 0.2–0.9)
Monocyte %: 6.6 %
NEUTROS ABS: 3.5 10*3/uL (ref 1.4–6.5)
Neutrophil %: 66.6 %
Platelet: 176 10*3/uL (ref 150–440)
RBC: 4.16 10*6/uL (ref 3.80–5.20)
RDW: 14.5 % (ref 11.5–14.5)
WBC: 5.3 10*3/uL (ref 3.6–11.0)

## 2014-01-23 LAB — BASIC METABOLIC PANEL
Anion Gap: 3 — ABNORMAL LOW (ref 7–16)
BUN: 6 mg/dL — ABNORMAL LOW (ref 7–18)
CO2: 26 mmol/L (ref 21–32)
Calcium, Total: 8.7 mg/dL (ref 8.5–10.1)
Chloride: 107 mmol/L (ref 98–107)
Creatinine: 0.81 mg/dL (ref 0.60–1.30)
EGFR (African American): >60
EGFR (Non-African Amer.): >60
Glucose: 90 mg/dL (ref 65–99)
OSMOLALITY: 269 (ref 275–301)
POTASSIUM: 3.9 mmol/L (ref 3.5–5.1)
Sodium: 136 mmol/L (ref 136–145)

## 2014-01-28 LAB — CULTURE, BLOOD (SINGLE)

## 2014-01-29 ENCOUNTER — Ambulatory Visit: Payer: Self-pay | Admitting: Pain Medicine

## 2014-02-05 ENCOUNTER — Ambulatory Visit: Payer: Self-pay | Admitting: Pain Medicine

## 2014-02-13 ENCOUNTER — Ambulatory Visit: Payer: Self-pay | Admitting: Pain Medicine

## 2014-02-21 ENCOUNTER — Ambulatory Visit: Payer: Self-pay | Admitting: Pain Medicine

## 2014-03-06 ENCOUNTER — Ambulatory Visit: Payer: Self-pay | Admitting: Pain Medicine

## 2014-03-21 ENCOUNTER — Ambulatory Visit: Payer: Self-pay | Admitting: Pain Medicine

## 2014-03-26 ENCOUNTER — Ambulatory Visit: Payer: Self-pay | Admitting: Pain Medicine

## 2014-04-17 ENCOUNTER — Ambulatory Visit: Payer: Self-pay | Admitting: Pain Medicine

## 2014-05-02 ENCOUNTER — Ambulatory Visit: Payer: Self-pay | Admitting: Pain Medicine

## 2014-05-03 ENCOUNTER — Ambulatory Visit: Payer: Self-pay | Admitting: Pain Medicine

## 2014-05-07 ENCOUNTER — Ambulatory Visit: Payer: Self-pay | Admitting: Pain Medicine

## 2014-05-11 ENCOUNTER — Ambulatory Visit: Payer: Self-pay | Admitting: Oncology

## 2014-05-14 ENCOUNTER — Ambulatory Visit: Payer: Self-pay | Admitting: Oncology

## 2014-05-14 LAB — COMPREHENSIVE METABOLIC PANEL
ALBUMIN: 3.3 g/dL — AB (ref 3.4–5.0)
ALK PHOS: 87 U/L
ALT: 15 U/L
Anion Gap: 6 — ABNORMAL LOW (ref 7–16)
BUN: 8 mg/dL (ref 7–18)
Bilirubin,Total: 0.2 mg/dL (ref 0.2–1.0)
CHLORIDE: 103 mmol/L (ref 98–107)
Calcium, Total: 8.9 mg/dL (ref 8.5–10.1)
Co2: 28 mmol/L (ref 21–32)
Creatinine: 0.79 mg/dL (ref 0.60–1.30)
EGFR (Non-African Amer.): >60
Glucose: 80 mg/dL (ref 65–99)
Osmolality: 271 (ref 275–301)
Potassium: 3.2 mmol/L — ABNORMAL LOW (ref 3.5–5.1)
SGOT(AST): 9 U/L — ABNORMAL LOW (ref 15–37)
SODIUM: 137 mmol/L (ref 136–145)
Total Protein: 7 g/dL (ref 6.4–8.2)

## 2014-05-14 LAB — TSH: THYROID STIMULATING HORM: 1.12 u[IU]/mL

## 2014-05-14 LAB — CBC CANCER CENTER
BASOS ABS: 0.1 x10 3/mm (ref 0.0–0.1)
BASOS PCT: 1 %
Eosinophil #: 0.1 x10 3/mm (ref 0.0–0.7)
Eosinophil %: 0.6 %
HCT: 41 % (ref 35.0–47.0)
HGB: 13.8 g/dL (ref 12.0–16.0)
Lymphocyte #: 1.8 x10 3/mm (ref 1.0–3.6)
Lymphocyte %: 19 %
MCH: 31.3 pg (ref 26.0–34.0)
MCHC: 33.5 g/dL (ref 32.0–36.0)
MCV: 93 fL (ref 80–100)
MONO ABS: 0.6 x10 3/mm (ref 0.2–0.9)
Monocyte %: 6.4 %
NEUTROS PCT: 73 %
Neutrophil #: 6.8 x10 3/mm — ABNORMAL HIGH (ref 1.4–6.5)
Platelet: 217 x10 3/mm (ref 150–440)
RBC: 4.4 10*6/uL (ref 3.80–5.20)
RDW: 13.3 % (ref 11.5–14.5)
WBC: 9.4 x10 3/mm (ref 3.6–11.0)

## 2014-05-14 LAB — T4, FREE: Free Thyroxine: 0.94 ng/dL (ref 0.76–1.46)

## 2014-05-19 ENCOUNTER — Ambulatory Visit: Payer: Self-pay | Admitting: Oncology

## 2014-05-21 ENCOUNTER — Ambulatory Visit: Payer: Self-pay | Admitting: Pain Medicine

## 2014-05-21 LAB — TSH: Thyroid Stimulating Horm: 3.16 u[IU]/mL

## 2014-05-28 ENCOUNTER — Ambulatory Visit: Payer: Self-pay | Admitting: Pain Medicine

## 2014-06-11 ENCOUNTER — Ambulatory Visit: Payer: Self-pay | Admitting: Pain Medicine

## 2014-06-19 ENCOUNTER — Ambulatory Visit: Payer: Self-pay | Admitting: Oncology

## 2014-06-23 ENCOUNTER — Emergency Department: Payer: Self-pay | Admitting: Emergency Medicine

## 2014-06-26 ENCOUNTER — Ambulatory Visit: Payer: Self-pay | Admitting: Pain Medicine

## 2014-07-04 ENCOUNTER — Ambulatory Visit: Payer: Self-pay | Admitting: Pain Medicine

## 2014-07-11 ENCOUNTER — Ambulatory Visit: Payer: Self-pay

## 2014-07-11 LAB — HM MAMMOGRAPHY

## 2014-07-19 ENCOUNTER — Ambulatory Visit: Payer: Self-pay | Admitting: Pain Medicine

## 2014-07-23 ENCOUNTER — Ambulatory Visit: Payer: Self-pay | Admitting: Pain Medicine

## 2014-07-31 ENCOUNTER — Ambulatory Visit: Payer: Self-pay | Admitting: Pain Medicine

## 2014-08-10 ENCOUNTER — Ambulatory Visit: Payer: Self-pay | Admitting: Orthopedic Surgery

## 2014-09-06 NOTE — Progress Notes (Signed)
Please put orders in Epic surgery 09-24-14 pre op 09-07-14 Thanks

## 2014-09-06 NOTE — Patient Instructions (Addendum)
Susan Houston  09/06/2014                           YOUR PROCEDURE IS SCHEDULED ON:  09/24/14                ENTER FROM [address] - GO TO PARKING DECK               LOOK FOR [employer]  / [employer]                              FOLLOW  SIGNS TO SHORT STAY CENTER                 ARRIVE AT SHORT STAY AT:  10:00 AM               CALL THIS NUMBER IF ANY PROBLEMS THE DAY OF SURGERY :               [phone]                                REMEMBER:   Do not eat food or drink liquids AFTER MIDNIGHT                  Take these medicines the morning of surgery with               A SIPS OF WATER :      WELLBUTRIN / BUSPAR / GABAPENTIN / TOPIRAMATE / MAY TAKE LORAZEPAM IF NEEDED / USE FLONASE NASAL SPRAY AND ADVAIR INHALER   Do not wear jewelry, make-up   Do not wear lotions, powders, or perfumes.   Do not shave legs or underarms 12 hrs. before surgery (men may shave face)  Do not bring valuables to the hospital.  Contacts, dentures or bridgework may not be worn into surgery.  Leave suitcase in the car. After surgery it may be brought to your room.  For patients admitted to the hospital more than one night, checkout time is            11:00 AM                                                       The day of discharge.   Patients discharged the day of surgery will not be allowed to drive home.            If going home same day of surgery, must have someone stay with you              FIRST 24 hrs at home and arrange for some one to drive you              home from hospital.   ________________________________________________________________________  [facility]  Before surgery, you can play an important role.  Because skin is not sterile, your skin needs to be as free of germs as possible.  You can reduce the number of germs on your skin by washing  with CHG (chlorahexidine gluconate) soap before surgery.  CHG is an antiseptic cleaner which kills germs and bonds with the skin to continue killing germs even after washing. Please DO NOT use if you have an allergy to CHG or antibacterial soaps.  If your skin becomes reddened/irritated stop using the CHG and inform your nurse when you arrive at Short Stay. Do not shave (including legs and underarms) for at least 48 hours prior to the first CHG shower.  You may shave your face. Please follow these instructions carefully:   1.  Shower with CHG Soap the night before surgery and the  morning of Surgery.   2.  If you choose to wash your hair, wash your hair first as usual with your  normal  Shampoo.   3.  After you shampoo, rinse your hair and body thoroughly to remove the  shampoo.                                         4.  Use CHG as you would any other liquid soap.  You can apply chg directly  to the skin and wash . Gently wash with scrungie or clean wascloth    5.  Apply the CHG Soap to your body ONLY FROM THE NECK DOWN.   Do not use on open                           Wound or open sores. Avoid contact with eyes, ears mouth and genitals (private parts).                        Genitals (private parts) with your normal soap.              6.  Wash thoroughly, paying special attention to the area where your surgery  will be performed.   7.  Thoroughly rinse your body with warm water from the neck down.   8.  DO NOT shower/wash with your normal soap after using and rinsing off  the CHG Soap .                9.  Pat yourself dry with a clean towel.             10.  Wear clean pajamas.             11.  Place clean sheets on your bed the night of your first shower and do not  sleep with pets.  Day of Surgery : Do not apply any lotions/deodorants the morning of surgery.  Please wear clean clothes to the hospital/surgery center.  FAILURE TO FOLLOW THESE INSTRUCTIONS MAY RESULT IN THE  CANCELLATION OF YOUR SURGERY    PATIENT SIGNATURE_________________________________  ______________________________________________________________________    Susan Houston  An incentive spirometer is a tool that can help keep your lungs clear and active. This tool measures how well you are filling your lungs with each breath. Taking long deep breaths may help reverse or decrease the chance of developing breathing (pulmonary) problems (especially infection) following:  A long period of time when you are unable to move or be active. BEFORE THE PROCEDURE   If the spirometer includes an indicator to show your best effort, your nurse or respiratory therapist will set it to a desired goal.  If possible, sit up straight or lean slightly forward. Try not to slouch.  Hold the incentive spirometer in an upright position. INSTRUCTIONS FOR USE   Sit on the edge of your bed if possible, or sit up as far as you can in bed or on a chair.  Hold the incentive spirometer in an upright position.  Breathe out normally.  Place the mouthpiece in your mouth and seal your lips tightly around it.  Breathe in slowly and as deeply as possible, raising the piston or the ball toward the top of the column.  Hold your breath for 3-5 seconds or for as long as possible. Allow the piston or ball to fall to the bottom of the column.  Remove the mouthpiece from your mouth and breathe out normally.  Rest for a few seconds and repeat Steps 1 through 7 at least 10 times every 1-2 hours when you are awake. Take your time and take a few normal breaths between deep breaths.  The spirometer may include an indicator to show your best effort. Use the indicator as a goal to work toward during each repetition.  After each set of 10 deep breaths, practice coughing to be sure your lungs are clear. If you have an incision (the cut made at the time of surgery), support your incision when coughing by placing a pillow  or rolled up towels firmly against it. Once you are able to get out of bed, walk around indoors and cough well. You may stop using the incentive spirometer when instructed by your caregiver.  RISKS AND COMPLICATIONS  Take your time so you do not get dizzy or light-headed.  If you are in pain, you may need to take or ask for pain medication before doing incentive spirometry. It is harder to take a deep breath if you are having pain. AFTER USE  Rest and breathe slowly and easily.  It can be helpful to keep track of a log of your progress. Your caregiver can provide you with a simple table to help with this. If you are using the spirometer at home, follow these instructions: [place] IF:   You are having difficultly using the spirometer.  You have trouble using the spirometer as often as instructed.  Your pain medication is not giving enough relief while using the spirometer.  You develop fever of 100.5 F (38.1 C) or higher. SEEK IMMEDIATE MEDICAL CARE IF:   You cough up bloody sputum that had not been present before.  You develop fever of 102 F (38.9 C) or greater.  You develop worsening pain at or near the incision site. MAKE SURE YOU:   Understand these instructions.  Will watch your condition.  Will get help right away if you are not doing well or get worse. Document Released: 02/15/2007 Document Revised: 12/28/2011 Document Reviewed: 04/18/2007 Exit[employer] Patient Information 2014 [employer], [place].   ________________________________________________________________________  WHAT IS A BLOOD TRANSFUSION? Blood Transfusion Information  A transfusion is the replacement of blood or some of its parts. Blood is made up of multiple cells which provide different functions.  Red blood cells carry oxygen and are used for blood loss replacement.  White blood cells fight against infection.  Platelets control bleeding.  Plasma  helps clot blood.  Other blood products  are available for specialized needs, such as hemophilia or other clotting disorders. BEFORE THE TRANSFUSION  Who gives blood for transfusions?   Healthy volunteers who are fully evaluated to make sure their blood is safe. This is blood bank blood. Transfusion therapy is the safest it has ever been in the practice of medicine. Before blood is taken from a donor, a complete history is taken to make sure that person has no history of diseases nor engages in risky social behavior (examples are intravenous drug use or sexual activity with multiple partners). The donor's travel history is screened to minimize risk of transmitting infections, such as malaria. The donated blood is tested for signs of infectious diseases, such as HIV and hepatitis. The blood is then tested to be sure it is compatible with you in order to minimize the chance of a transfusion reaction. If you or a relative donates blood, this is often done in anticipation of surgery and is not appropriate for emergency situations. It takes many days to process the donated blood. RISKS AND COMPLICATIONS Although transfusion therapy is very safe and saves many lives, the main dangers of transfusion include:   Getting an infectious disease.  Developing a transfusion reaction. This is an allergic reaction to something in the blood you were given. Every precaution is taken to prevent this. The decision to have a blood transfusion has been considered carefully by your caregiver before blood is given. Blood is not given unless the benefits outweigh the risks. AFTER THE TRANSFUSION  Right after receiving a blood transfusion, you will usually feel much better and more energetic. This is especially true if your red blood cells have gotten low (anemic). The transfusion raises the level of the red blood cells which carry oxygen, and this usually causes an energy increase.  The nurse administering the transfusion will monitor you carefully for  complications. HOME CARE INSTRUCTIONS  No special instructions are needed after a transfusion. You may find your energy is better. Speak with your caregiver about any limitations on activity for underlying diseases you may have. SEEK MEDICAL CARE IF:   Your condition is not improving after your transfusion.  You develop redness or irritation at the intravenous (IV) site. SEEK IMMEDIATE MEDICAL CARE IF:  Any of the following symptoms occur over the next 12 hours:  Shaking chills.  You have a temperature by mouth above 102 F (38.9 C), not controlled by medicine.  Chest, back, or muscle pain.  People around you feel you are not acting correctly or are confused.  Shortness of breath or difficulty breathing.  Dizziness and fainting.  You get a rash or develop hives.  You have a decrease in urine output.  Your urine turns a dark color or changes to pink, red, or brown. Any of the following symptoms occur over the next 10 days:  You have a temperature by mouth above 102 F (38.9 C), not controlled by medicine.  Shortness of breath.  Weakness after normal activity.  The white part of the eye turns yellow (jaundice).  You have a decrease in the amount of urine or are urinating less often.  Your urine turns a dark color or changes to pink, red, or brown. Document Released: 10/02/2000 Document Revised: 12/28/2011 Document Reviewed: 05/21/2008 [facility] Patient Information 2014 [place], [place].  _______________________________________________________________________

## 2014-09-07 ENCOUNTER — Encounter (HOSPITAL_COMMUNITY)
Admission: RE | Admit: 2014-09-07 | Discharge: 2014-09-07 | Disposition: A | Discharge status: Home / Self Care | Payer: Medicare Other | Source: Ambulatory Visit | Attending: Orthopedic Surgery | Admitting: Orthopedic Surgery

## 2014-09-07 ENCOUNTER — Encounter (HOSPITAL_COMMUNITY): Payer: Self-pay

## 2014-09-07 DIAGNOSIS — Z01812 Encounter for preprocedural laboratory examination: Secondary | ICD-10-CM | POA: Insufficient documentation

## 2014-09-07 HISTORY — DX: Other skin changes: R23.8

## 2014-09-07 HISTORY — DX: Unspecified urinary incontinence: R32

## 2014-09-07 HISTORY — DX: Dorsalgia, unspecified: M54.9

## 2014-09-07 HISTORY — DX: Anxiety disorder, unspecified: F41.9

## 2014-09-07 HISTORY — DX: Pain in leg, unspecified: M79.606

## 2014-09-07 HISTORY — DX: Other chronic pain: G89.29

## 2014-09-07 HISTORY — DX: Depression, unspecified: F32.A

## 2014-09-07 HISTORY — DX: Personal history of urinary calculi: Z87.442

## 2014-09-07 HISTORY — DX: Sleep disorder, unspecified: G47.9

## 2014-09-07 HISTORY — DX: Spontaneous ecchymoses: R23.3

## 2014-09-07 HISTORY — DX: Malignant (primary) neoplasm, unspecified: C80.1

## 2014-09-07 HISTORY — DX: Major depressive disorder, single episode, unspecified: F32.9

## 2014-09-07 HISTORY — DX: Chronic obstructive pulmonary disease, unspecified: J44.9

## 2014-09-07 HISTORY — DX: Unspecified osteoarthritis, unspecified site: M19.90

## 2014-09-07 LAB — CBC
HCT: 40.1 % (ref 36.0–46.0)
HEMOGLOBIN: 13 g/dL (ref 12.0–15.0)
MCH: 30.2 pg (ref 26.0–34.0)
MCHC: 32.4 g/dL (ref 30.0–36.0)
MCV: 93.3 fL (ref 78.0–100.0)
Platelets: 168 10*3/uL (ref 150–400)
RBC: 4.3 MIL/uL (ref 3.87–5.11)
RDW: 13.1 % (ref 11.5–15.5)
WBC: 7.1 10*3/uL (ref 4.0–10.5)

## 2014-09-07 LAB — BASIC METABOLIC PANEL
Anion gap: 11 (ref 5–15)
BUN: 7 mg/dL (ref 6–23)
CHLORIDE: 103 meq/L (ref 96–112)
CO2: 26 mEq/L (ref 19–32)
CREATININE: 0.69 mg/dL (ref 0.50–1.10)
Calcium: 9.2 mg/dL (ref 8.4–10.5)
GFR calc Af Amer: >90 mL/min (ref >90)
GFR calc non Af Amer: >90 mL/min (ref >90)
Glucose, Bld: 95 mg/dL (ref 70–99)
Potassium: 4.1 mEq/L (ref 3.7–5.3)
Sodium: 140 mEq/L (ref 137–147)

## 2014-09-07 LAB — URINALYSIS, ROUTINE W REFLEX MICROSCOPIC
Bilirubin Urine: NEGATIVE
Glucose, UA: NEGATIVE mg/dL
HGB URINE DIPSTICK: NEGATIVE
KETONES UR: NEGATIVE mg/dL
Leukocytes, UA: NEGATIVE
Nitrite: NEGATIVE
Protein, ur: NEGATIVE mg/dL
SPECIFIC GRAVITY, URINE: 1.01 (ref 1.005–1.030)
UROBILINOGEN UA: 1 mg/dL (ref 0.0–1.0)
pH: 6 (ref 5.0–8.0)

## 2014-09-07 LAB — PROTIME-INR
INR: 1.07 (ref 0.00–1.49)
PROTHROMBIN TIME: 14 s (ref 11.6–15.2)

## 2014-09-07 LAB — APTT: aPTT: 30 seconds (ref 24–37)

## 2014-09-12 NOTE — H&P (Signed)
Susan Houston is an 53 y.o. female.    Chief Complaint:    Bilateral knee pain S/P bilateral TKAs   Procedure: Bilateral saphenous neurectomies and open left peripatellar osteophytectomy  HPI: Pt is a 53 y.o. female complaining of bilateral knee pain for many years. She has had both of her knees replaced many years ago in [facility].  She has had pain since the surgeries that has gradually increased.   X-rays in the clinic show bilateral total knee arthroplasties and an anteromedial patellar osteophyte on the left.  Pt has tried various conservative treatments which have failed to alleviate their symptoms, including cortisone injections, assist devices and activity modification.   The predominant findings were right greater than left tenderness along the proximal medial tibia, for which diagnostic Marcaine injections were carried out. With the injections she noted significant improvement in her symptoms over the anterior aspect of her knee.   Various options are discussed with the patient. Risks, benefits and expectations were discussed with the patient.  Risks including but not limited to the risk of anesthesia, blood clots, nerve damage, blood vessel damage, failure of the prosthesis, infection and up to and including death.  Patient understand the risks, benefits and expectations and wishes to proceed with surgery.   PCP: Golden Pop, MD  D/C Plans:      Home with HHPT  Post-op Meds:       No Rx given  Tranexamic Acid:      To be given - IV   Decadron:      Is to be given  FYI:     Lovenox post-op (unable to tolerate ASA)  Norco post-op   PMH: Past Medical History  Diagnosis Date  . COPD (chronic obstructive pulmonary disease)   . Arthritis   . Bruises easily   . History of kidney stones   . Incontinence of urine   . Difficulty sleeping   . Depression   . Anxiety   . 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 Back pain   . Chronic leg  pain     BILATERAL    PSH: Past Surgical History  Procedure Laterality Date  . Joint replacement  2009/2010    BIL TOTAL KNEES  . Abdominal hysterectomy  1983  . Bladder suspension  2014  . Cholecystectomy    . Tonsillectomy      Social History:  reports that she has been smoking.  She does not have any smokeless tobacco history on file. She reports that she does not use illicit drugs. Her alcohol history is not on file.  Allergies:  Allergies  Allergen Reactions  . Lexapro [Escitalopram Oxalate] Other (See Comments)    Keeps awake for days.     Medications: No current facility-administered medications for this encounter.   Current Outpatient Prescriptions  Medication Sig Dispense Refill  . aspirin-acetaminophen-caffeine (EXCEDRIN MIGRAINE) 250-250-65 MG per tablet Take 1 tablet by mouth every 6 (six) hours as needed for headache.    . betamethasone dipropionate (DIPROLENE) 0.05 % cream Apply 1 application topically 2 (two) times daily.    Marland Kitchen buPROPion (WELLBUTRIN SR) 100 MG 12 hr tablet Take 100 mg by mouth every morning.    . busPIRone (BUSPAR) 10 MG tablet Take 10 mg by mouth 2 (two) times daily.    . clotrimazole-betamethasone (LOTRISONE) cream Apply 1 application topically 2 (two) times daily.    . fluticasone (FLONASE) 50 MCG/ACT  nasal spray Place 2 sprays into both nostrils every morning.    . Fluticasone-Salmeterol (ADVAIR) 500-50 MCG/DOSE AEPB Inhale 2 puffs into the lungs 2 (two) times daily.    Marland Kitchen gabapentin (NEURONTIN) 300 MG capsule Take 300 mg by mouth 3 (three) times daily.    Marland Kitchen loperamide (IMODIUM A-D) 2 MG tablet Take 2 mg by mouth 4 (four) times daily as needed for diarrhea or loose stools.    Marland Kitchen LORazepam (ATIVAN) 0.5 MG tablet Take 0.5 mg by mouth 2 (two) times daily.    . sertraline (ZOLOFT) 100 MG tablet Take 200 mg by mouth daily.    . Suvorexant (BELSOMRA) 10 MG TABS Take 1 tablet by mouth at bedtime as needed.    . topiramate (TOPAMAX) 50 MG tablet Take  50 mg by mouth 2 (two) times daily.    . traZODone (DESYREL) 100 MG tablet Take 200-300 mg by mouth at bedtime as needed for sleep.        Review of Systems  Constitutional: Negative.   HENT: Negative.   Eyes: Negative.   Respiratory: Negative.   Cardiovascular: Negative.   Gastrointestinal: Negative.   Genitourinary: Negative.   Musculoskeletal: Positive for back pain and joint pain.  Skin: Negative.   Neurological: Negative.   Endo/Heme/Allergies: Negative.   Psychiatric/Behavioral: Positive for depression. The patient is nervous/anxious and has insomnia.      Physical Exam  Constitutional: She is oriented to person, place, and time. She appears well-developed and well-nourished.  HENT:  Head: Normocephalic and atraumatic.  Eyes: Pupils are equal, round, and reactive to light.  Neck: Neck supple. No JVD present. No tracheal deviation present. No thyromegaly present.  Cardiovascular: Normal rate, regular rhythm, normal heart sounds and intact distal pulses.   Respiratory: Effort normal and breath sounds normal. No stridor. No respiratory distress. She has no wheezes.  GI: Soft. There is no tenderness. There is no guarding.  Musculoskeletal:       Right knee: She exhibits decreased range of motion, laceration (Healed previous incision) and bony tenderness. She exhibits no effusion, no ecchymosis and no deformity. Tenderness found.       Left knee: She exhibits decreased range of motion, laceration (healed previous incision) and bony tenderness. She exhibits no effusion, no ecchymosis and no deformity. Tenderness found.  Lymphadenopathy:    She has no cervical adenopathy.  Neurological: She is alert and oriented to person, place, and time.  Skin: Skin is warm and dry.  Psychiatric: She has a normal mood and affect.     Assessment/Plan Assessment: Bilateral knee pain S/P bilateral TKAs   Plan: Patient will undergo a bilateral saphenous neurectomies and open left  peripatellar osteophytectomy on 09/24/2014 per Dr. Alvan Dame at [facility]. Risks benefits and expectations were discussed with the patient. Patient understand risks, benefits and expectations and wishes to proceed.    West Pugh Sander Speckman   PA-C  09/12/2014, 3:28 PM

## 2014-09-22 NOTE — Anesthesia Preprocedure Evaluation (Addendum)
Anesthesia Evaluation  Patient identified by MRN, date of birth, ID band Patient awake    Reviewed: Allergy & Precautions, H&P , NPO status , Patient's Chart, lab work & pertinent test results  History of Anesthesia Complications Negative for: history of anesthetic complications  Airway Mallampati: II  TM Distance: >3 FB Neck ROM: Full    Dental no notable dental hx. (+) Dental Advisory Given, Edentulous Lower, Edentulous Upper   Pulmonary COPD COPD inhaler, Current Smoker,  breath sounds clear to auscultation  Pulmonary exam normal       Cardiovascular Exercise Tolerance: Good negative cardio ROS  Rhythm:Regular Rate:Normal     Neuro/Psych PSYCHIATRIC DISORDERS Anxiety Depression negative neurological ROS     GI/Hepatic negative GI ROS, Neg liver ROS,   Endo/Other  negative endocrine ROS  Renal/GU negative Renal ROS  negative genitourinary   Musculoskeletal  (+) Arthritis -, Osteoarthritis,    Abdominal   Peds negative pediatric ROS (+)  Hematology negative hematology ROS (+)   Anesthesia Other Findings   Reproductive/Obstetrics negative OB ROS                            Anesthesia Physical Anesthesia Plan  ASA: II  Anesthesia Plan: General   Post-op Pain Management:    Induction: Intravenous  Airway Management Planned: Oral ETT  Additional Equipment:   Intra-op Plan:   Post-operative Plan: Extubation in OR  Informed Consent: I have reviewed the patients History and Physical, chart, labs and discussed the procedure including the risks, benefits and alternatives for the proposed anesthesia with the patient or authorized representative who has indicated his/her understanding and acceptance.   Dental advisory given  Plan Discussed with: CRNA  Anesthesia Plan Comments:        Anesthesia Quick Evaluation

## 2014-09-24 ENCOUNTER — Ambulatory Visit (HOSPITAL_COMMUNITY): Payer: Medicare Other | Admitting: Anesthesiology

## 2014-09-24 ENCOUNTER — Observation Stay (HOSPITAL_COMMUNITY)
Admission: RE | Admit: 2014-09-24 | Discharge: 2014-09-25 | Disposition: A | Discharge status: Home / Self Care | Payer: Medicare Other | Source: Ambulatory Visit | Attending: Orthopedic Surgery | Admitting: Orthopedic Surgery

## 2014-09-24 ENCOUNTER — Encounter (HOSPITAL_COMMUNITY)
Admission: RE | Disposition: A | Discharge status: Home / Self Care | Payer: Self-pay | Source: Ambulatory Visit | Attending: Orthopedic Surgery

## 2014-09-24 ENCOUNTER — Encounter (HOSPITAL_COMMUNITY): Payer: Self-pay | Admitting: *Deleted

## 2014-09-24 DIAGNOSIS — J449 Chronic obstructive pulmonary disease, unspecified: Secondary | ICD-10-CM | POA: Insufficient documentation

## 2014-09-24 DIAGNOSIS — M25562 Pain in left knee: Secondary | ICD-10-CM | POA: Diagnosis present

## 2014-09-24 DIAGNOSIS — F419 Anxiety disorder, unspecified: Secondary | ICD-10-CM | POA: Insufficient documentation

## 2014-09-24 DIAGNOSIS — M25762 Osteophyte, left knee: Secondary | ICD-10-CM | POA: Insufficient documentation

## 2014-09-24 DIAGNOSIS — G588 Other specified mononeuropathies: Principal | ICD-10-CM | POA: Insufficient documentation

## 2014-09-24 DIAGNOSIS — Z87442 Personal history of urinary calculi: Secondary | ICD-10-CM | POA: Insufficient documentation

## 2014-09-24 DIAGNOSIS — F329 Major depressive disorder, single episode, unspecified: Secondary | ICD-10-CM | POA: Diagnosis not present

## 2014-09-24 DIAGNOSIS — M1990 Unspecified osteoarthritis, unspecified site: Secondary | ICD-10-CM | POA: Diagnosis not present

## 2014-09-24 DIAGNOSIS — G5780 Other specified mononeuropathies of unspecified lower limb: Secondary | ICD-10-CM | POA: Diagnosis present

## 2014-09-24 HISTORY — PX: EXCISION NEUROMA: SHX6350

## 2014-09-24 LAB — CBC
HCT: 40.4 % (ref 36.0–46.0)
HEMOGLOBIN: 13.1 g/dL (ref 12.0–15.0)
MCH: 29.8 pg (ref 26.0–34.0)
MCHC: 32.4 g/dL (ref 30.0–36.0)
MCV: 92 fL (ref 78.0–100.0)
Platelets: 173 10*3/uL (ref 150–400)
RBC: 4.39 MIL/uL (ref 3.87–5.11)
RDW: 12.5 % (ref 11.5–15.5)
WBC: 5.9 10*3/uL (ref 4.0–10.5)

## 2014-09-24 LAB — CREATININE, SERUM: Creatinine, Ser: 0.6 mg/dL (ref 0.50–1.10)

## 2014-09-24 LAB — TYPE AND SCREEN
ABO/RH(D): B NEG
ANTIBODY SCREEN: NEGATIVE

## 2014-09-24 LAB — ABO/RH: ABO/RH(D): B NEG

## 2014-09-24 SURGERY — EXCISION, NEUROMA
Anesthesia: General | Site: Knee | Laterality: Bilateral

## 2014-09-24 MED ORDER — HYDROMORPHONE HCL 1 MG/ML IJ SOLN
0.5000 mg | INTRAMUSCULAR | Status: DC | PRN
  Administered 2014-09-24: 0.5 mg via INTRAVENOUS
  Administered 2014-09-24 – 2014-09-25 (×5): 1 mg via INTRAVENOUS
  Filled 2014-09-24 (×6): qty 1

## 2014-09-24 MED ORDER — PROPOFOL 10 MG/ML IV BOLUS
INTRAVENOUS | Status: AC
  Filled 2014-09-24: qty 20

## 2014-09-24 MED ORDER — BUPROPION HCL ER (SR) 100 MG PO TB12
100.0000 mg | ORAL_TABLET | Freq: Every morning | ORAL | Status: DC
  Administered 2014-09-25: 100 mg via ORAL
  Filled 2014-09-24: qty 1

## 2014-09-24 MED ORDER — CEFAZOLIN SODIUM-DEXTROSE 2-3 GM-% IV SOLR
2.0000 g | INTRAVENOUS | Status: AC
  Administered 2014-09-24: 2 g via INTRAVENOUS

## 2014-09-24 MED ORDER — GABAPENTIN 300 MG PO CAPS
300.0000 mg | ORAL_CAPSULE | Freq: Three times a day (TID) | ORAL | Status: DC
  Administered 2014-09-24 – 2014-09-25 (×4): 300 mg via ORAL
  Filled 2014-09-24 (×5): qty 1

## 2014-09-24 MED ORDER — FENTANYL CITRATE 0.05 MG/ML IJ SOLN
INTRAMUSCULAR | Status: AC
  Filled 2014-09-24: qty 5

## 2014-09-24 MED ORDER — METHOCARBAMOL 500 MG PO TABS
500.0000 mg | ORAL_TABLET | Freq: Four times a day (QID) | ORAL | Status: DC | PRN
  Administered 2014-09-25 (×2): 500 mg via ORAL
  Filled 2014-09-24 (×2): qty 1

## 2014-09-24 MED ORDER — MENTHOL 3 MG MT LOZG: 1.0000 | LOZENGE | OROMUCOSAL | Status: DC | PRN

## 2014-09-24 MED ORDER — CEFAZOLIN SODIUM-DEXTROSE 2-3 GM-% IV SOLR
INTRAVENOUS | Status: AC
  Filled 2014-09-24: qty 50

## 2014-09-24 MED ORDER — BUSPIRONE HCL 10 MG PO TABS
10.0000 mg | ORAL_TABLET | Freq: Two times a day (BID) | ORAL | Status: DC
  Administered 2014-09-24 – 2014-09-25 (×2): 10 mg via ORAL
  Filled 2014-09-24 (×4): qty 1

## 2014-09-24 MED ORDER — PROPOFOL 10 MG/ML IV BOLUS
INTRAVENOUS | Status: DC | PRN
  Administered 2014-09-24: 80 mg via INTRAVENOUS

## 2014-09-24 MED ORDER — MIDAZOLAM HCL 5 MG/5ML IJ SOLN
INTRAMUSCULAR | Status: DC | PRN
  Administered 2014-09-24: 2 mg via INTRAVENOUS

## 2014-09-24 MED ORDER — LIDOCAINE HCL (CARDIAC) 20 MG/ML IV SOLN
INTRAVENOUS | Status: DC | PRN
  Administered 2014-09-24: 50 mg via INTRAVENOUS

## 2014-09-24 MED ORDER — ONDANSETRON HCL 4 MG/2ML IJ SOLN
INTRAMUSCULAR | Status: DC | PRN
  Administered 2014-09-24: 4 mg via INTRAVENOUS

## 2014-09-24 MED ORDER — FENTANYL CITRATE 0.05 MG/ML IJ SOLN
INTRAMUSCULAR | Status: AC
  Filled 2014-09-24: qty 2

## 2014-09-24 MED ORDER — KETOROLAC TROMETHAMINE 30 MG/ML IJ SOLN
INTRAMUSCULAR | Status: AC
  Filled 2014-09-24: qty 1

## 2014-09-24 MED ORDER — PHENOL 1.4 % MT LIQD: 1.0000 | OROMUCOSAL | Status: DC | PRN

## 2014-09-24 MED ORDER — LACTATED RINGERS IV SOLN
INTRAVENOUS | Status: DC
  Administered 2014-09-24: 1000 mL via INTRAVENOUS
  Administered 2014-09-24: 15:00:00 via INTRAVENOUS

## 2014-09-24 MED ORDER — ENOXAPARIN SODIUM 40 MG/0.4ML ~~LOC~~ SOLN
40.0000 mg | SUBCUTANEOUS | Status: DC
  Administered 2014-09-25: 40 mg via SUBCUTANEOUS
  Filled 2014-09-24 (×2): qty 0.4

## 2014-09-24 MED ORDER — SODIUM CHLORIDE 0.9 % IR SOLN
Status: DC | PRN
  Administered 2014-09-24: 1

## 2014-09-24 MED ORDER — METOCLOPRAMIDE HCL 10 MG PO TABS: 5.0000 mg | ORAL_TABLET | Freq: Three times a day (TID) | ORAL | Status: DC | PRN

## 2014-09-24 MED ORDER — BUPIVACAINE-EPINEPHRINE (PF) 0.25% -1:200000 IJ SOLN
INTRAMUSCULAR | Status: AC
  Filled 2014-09-24: qty 30

## 2014-09-24 MED ORDER — DEXAMETHASONE SODIUM PHOSPHATE 10 MG/ML IJ SOLN
10.0000 mg | Freq: Once | INTRAMUSCULAR | Status: AC
  Administered 2014-09-25: 10 mg via INTRAVENOUS
  Filled 2014-09-24: qty 1

## 2014-09-24 MED ORDER — POLYETHYLENE GLYCOL 3350 17 G PO PACK
17.0000 g | PACK | Freq: Two times a day (BID) | ORAL | Status: DC
  Administered 2014-09-25: 17 g via ORAL

## 2014-09-24 MED ORDER — ONDANSETRON HCL 4 MG/2ML IJ SOLN: 4.0000 mg | Freq: Four times a day (QID) | INTRAMUSCULAR | Status: DC | PRN

## 2014-09-24 MED ORDER — MOMETASONE FURO-FORMOTEROL FUM 200-5 MCG/ACT IN AERO
2.0000 | INHALATION_SPRAY | Freq: Two times a day (BID) | RESPIRATORY_TRACT | Status: DC
  Administered 2014-09-24 – 2014-09-25 (×2): 2 via RESPIRATORY_TRACT
  Filled 2014-09-24: qty 8.8

## 2014-09-24 MED ORDER — METOCLOPRAMIDE HCL 5 MG/ML IJ SOLN: 5.0000 mg | Freq: Three times a day (TID) | INTRAMUSCULAR | Status: DC | PRN

## 2014-09-24 MED ORDER — DIPHENHYDRAMINE HCL 25 MG PO CAPS: 25.0000 mg | ORAL_CAPSULE | Freq: Four times a day (QID) | ORAL | Status: DC | PRN

## 2014-09-24 MED ORDER — SERTRALINE HCL 100 MG PO TABS
200.0000 mg | ORAL_TABLET | Freq: Every day | ORAL | Status: DC
  Administered 2014-09-25: 200 mg via ORAL
  Filled 2014-09-24 (×2): qty 2

## 2014-09-24 MED ORDER — CEFAZOLIN SODIUM-DEXTROSE 2-3 GM-% IV SOLR
2.0000 g | Freq: Four times a day (QID) | INTRAVENOUS | Status: AC
  Administered 2014-09-25: 2 g via INTRAVENOUS
  Filled 2014-09-24 (×2): qty 50

## 2014-09-24 MED ORDER — DEXTROSE 5 % IV SOLN
500.0000 mg | Freq: Four times a day (QID) | INTRAVENOUS | Status: DC | PRN
  Administered 2014-09-24: 500 mg via INTRAVENOUS
  Filled 2014-09-24 (×2): qty 5

## 2014-09-24 MED ORDER — CHLORHEXIDINE GLUCONATE 4 % EX LIQD: 60.0000 mL | Freq: Once | CUTANEOUS | Status: DC

## 2014-09-24 MED ORDER — CELECOXIB 200 MG PO CAPS
200.0000 mg | ORAL_CAPSULE | Freq: Two times a day (BID) | ORAL | Status: DC
  Administered 2014-09-24 – 2014-09-25 (×2): 200 mg via ORAL
  Filled 2014-09-24 (×3): qty 1

## 2014-09-24 MED ORDER — TOPIRAMATE 25 MG PO TABS
50.0000 mg | ORAL_TABLET | Freq: Two times a day (BID) | ORAL | Status: DC
  Administered 2014-09-24 – 2014-09-25 (×2): 50 mg via ORAL
  Filled 2014-09-24 (×3): qty 2

## 2014-09-24 MED ORDER — TRAZODONE HCL 50 MG PO TABS: 200.0000 mg | ORAL_TABLET | Freq: Every evening | ORAL | Status: DC | PRN

## 2014-09-24 MED ORDER — BISACODYL 10 MG RE SUPP: 10.0000 mg | Freq: Every day | RECTAL | Status: DC | PRN

## 2014-09-24 MED ORDER — MAGNESIUM CITRATE PO SOLN: 1.0000 | Freq: Once | ORAL | Status: AC | PRN

## 2014-09-24 MED ORDER — HYDROCODONE-ACETAMINOPHEN 7.5-325 MG PO TABS
1.0000 | ORAL_TABLET | ORAL | Status: DC
  Administered 2014-09-24 (×2): 2 via ORAL
  Filled 2014-09-24 (×2): qty 2

## 2014-09-24 MED ORDER — FENTANYL CITRATE 0.05 MG/ML IJ SOLN
25.0000 ug | INTRAMUSCULAR | Status: DC | PRN
  Administered 2014-09-24: 50 ug via INTRAVENOUS

## 2014-09-24 MED ORDER — DEXAMETHASONE SODIUM PHOSPHATE 10 MG/ML IJ SOLN
INTRAMUSCULAR | Status: DC | PRN
  Administered 2014-09-24: 10 mg via INTRAVENOUS

## 2014-09-24 MED ORDER — ONDANSETRON HCL 4 MG/2ML IJ SOLN: 4.0000 mg | Freq: Once | INTRAMUSCULAR | Status: DC | PRN

## 2014-09-24 MED ORDER — CLOTRIMAZOLE 1 % EX CREA
TOPICAL_CREAM | Freq: Two times a day (BID) | CUTANEOUS | Status: DC
  Filled 2014-09-24: qty 15

## 2014-09-24 MED ORDER — SODIUM CHLORIDE 0.9 % IJ SOLN
INTRAMUSCULAR | Status: AC
  Filled 2014-09-24: qty 50

## 2014-09-24 MED ORDER — MIDAZOLAM HCL 2 MG/2ML IJ SOLN
INTRAMUSCULAR | Status: AC
  Filled 2014-09-24: qty 2

## 2014-09-24 MED ORDER — FLUTICASONE PROPIONATE 50 MCG/ACT NA SUSP
2.0000 | Freq: Every morning | NASAL | Status: DC
  Administered 2014-09-25: 2 via NASAL
  Filled 2014-09-24: qty 16

## 2014-09-24 MED ORDER — POTASSIUM CHLORIDE 2 MEQ/ML IV SOLN
INTRAVENOUS | Status: DC
  Administered 2014-09-24 – 2014-09-25 (×2): via INTRAVENOUS
  Filled 2014-09-24 (×4): qty 1000

## 2014-09-24 MED ORDER — DEXAMETHASONE SODIUM PHOSPHATE 10 MG/ML IJ SOLN: 10.0000 mg | Freq: Once | INTRAMUSCULAR | Status: DC

## 2014-09-24 MED ORDER — LORAZEPAM 0.5 MG PO TABS
0.5000 mg | ORAL_TABLET | Freq: Two times a day (BID) | ORAL | Status: DC
  Administered 2014-09-24 – 2014-09-25 (×2): 0.5 mg via ORAL
  Filled 2014-09-24 (×2): qty 1

## 2014-09-24 MED ORDER — TRANEXAMIC ACID 100 MG/ML IV SOLN
1000.0000 mg | Freq: Once | INTRAVENOUS | Status: AC
  Administered 2014-09-24: 1000 mg via INTRAVENOUS
  Filled 2014-09-24: qty 10

## 2014-09-24 MED ORDER — ALUM & MAG HYDROXIDE-SIMETH 200-200-20 MG/5ML PO SUSP: 30.0000 mL | ORAL | Status: DC | PRN

## 2014-09-24 MED ORDER — FERROUS SULFATE 325 (65 FE) MG PO TABS
325.0000 mg | ORAL_TABLET | Freq: Three times a day (TID) | ORAL | Status: DC
  Administered 2014-09-24 – 2014-09-25 (×3): 325 mg via ORAL
  Filled 2014-09-24 (×5): qty 1

## 2014-09-24 MED ORDER — ONDANSETRON HCL 4 MG PO TABS: 4.0000 mg | ORAL_TABLET | Freq: Four times a day (QID) | ORAL | Status: DC | PRN

## 2014-09-24 MED ORDER — DOCUSATE SODIUM 100 MG PO CAPS
100.0000 mg | ORAL_CAPSULE | Freq: Two times a day (BID) | ORAL | Status: DC
  Administered 2014-09-24 – 2014-09-25 (×2): 100 mg via ORAL

## 2014-09-24 MED ORDER — FENTANYL CITRATE 0.05 MG/ML IJ SOLN
INTRAMUSCULAR | Status: DC | PRN
  Administered 2014-09-24 (×2): 50 ug via INTRAVENOUS
  Administered 2014-09-24: 25 ug via INTRAVENOUS
  Administered 2014-09-24 (×2): 50 ug via INTRAVENOUS
  Administered 2014-09-24: 25 ug via INTRAVENOUS

## 2014-09-24 SURGICAL SUPPLY — 54 items
BAG ZIPLOCK 12X15 (MISCELLANEOUS) ×2 IMPLANT
BANDAGE ELASTIC 6 VELCRO ST LF (GAUZE/BANDAGES/DRESSINGS) ×4 IMPLANT
BANDAGE ESMARK 6X9 LF (GAUZE/BANDAGES/DRESSINGS) ×1 IMPLANT
BLADE SAW SGTL 13.0X1.19X90.0M (BLADE) ×4 IMPLANT
BLADE SURG SZ10 CARB STEEL (BLADE) ×2 IMPLANT
BNDG COHESIVE 4X5 TAN STRL (GAUZE/BANDAGES/DRESSINGS) ×2 IMPLANT
BNDG ESMARK 6X9 LF (GAUZE/BANDAGES/DRESSINGS) ×2
BOWL SMART MIX CTS (DISPOSABLE) ×4 IMPLANT
CUFF TOURN SGL QUICK 34 (TOURNIQUET CUFF) ×2
CUFF TRNQT CYL 34X4X40X1 (TOURNIQUET CUFF) ×2 IMPLANT
DRAPE EXTREMITY BILATERAL (DRAPE) ×2 IMPLANT
DRAPE POUCH INSTRU U-SHP 10X18 (DRAPES) ×2 IMPLANT
DRAPE U-SHAPE 47X51 STRL (DRAPES) ×6 IMPLANT
DRSG AQUACEL AG ADV 3.5X10 (GAUZE/BANDAGES/DRESSINGS) ×4 IMPLANT
DURAPREP 26ML APPLICATOR (WOUND CARE) ×4 IMPLANT
ELECT REM PT RETURN 9FT ADLT (ELECTROSURGICAL) ×2
ELECTRODE REM PT RTRN 9FT ADLT (ELECTROSURGICAL) ×1 IMPLANT
GAUZE SPONGE 2X2 8PLY STRL LF (GAUZE/BANDAGES/DRESSINGS) IMPLANT
GLOVE BIOGEL PI IND STRL 7.5 (GLOVE) ×1 IMPLANT
GLOVE BIOGEL PI IND STRL 8.5 (GLOVE) ×1 IMPLANT
GLOVE BIOGEL PI INDICATOR 7.5 (GLOVE) ×1
GLOVE BIOGEL PI INDICATOR 8.5 (GLOVE) ×1
GLOVE ECLIPSE 8.0 STRL XLNG CF (GLOVE) ×4 IMPLANT
GLOVE ORTHO TXT STRL SZ7.5 (GLOVE) ×4 IMPLANT
GOWN SPEC L3 XXLG W/TWL (GOWN DISPOSABLE) ×2 IMPLANT
GOWN STRL REUS W/TWL LRG LVL3 (GOWN DISPOSABLE) ×2 IMPLANT
HANDPIECE INTERPULSE COAX TIP (DISPOSABLE) ×1
IMMOBILIZER KNEE 20 (SOFTGOODS)
IMMOBILIZER KNEE 20 THIGH 36 (SOFTGOODS) IMPLANT
KIT BASIN OR (CUSTOM PROCEDURE TRAY) ×2 IMPLANT
LIQUID BAND (GAUZE/BANDAGES/DRESSINGS) ×4 IMPLANT
MANIFOLD NEPTUNE II (INSTRUMENTS) ×2 IMPLANT
NDL SAFETY ECLIPSE 18X1.5 (NEEDLE) ×1 IMPLANT
NEEDLE HYPO 18GX1.5 SHARP (NEEDLE) ×1
NS IRRIG 1000ML POUR BTL (IV SOLUTION) ×4 IMPLANT
PACK TOTAL JOINT (CUSTOM PROCEDURE TRAY) ×2 IMPLANT
POSITIONER SURGICAL ARM (MISCELLANEOUS) ×2 IMPLANT
SET HNDPC FAN SPRY TIP SCT (DISPOSABLE) ×1 IMPLANT
SET PAD KNEE POSITIONER (MISCELLANEOUS) ×4 IMPLANT
SPONGE GAUZE 2X2 STER 10/PKG (GAUZE/BANDAGES/DRESSINGS)
SPONGE LAP 18X18 X RAY DECT (DISPOSABLE) ×2 IMPLANT
STOCKINETTE 8 INCH (MISCELLANEOUS) ×2 IMPLANT
SUCTION FRAZIER 12FR DISP (SUCTIONS) ×2 IMPLANT
SUT MNCRL AB 4-0 PS2 18 (SUTURE) ×4 IMPLANT
SUT VIC AB 1 CT1 27 (SUTURE) ×2
SUT VIC AB 1 CT1 27XBRD ANTBC (SUTURE) ×2 IMPLANT
SUT VIC AB 2-0 CT1 27 (SUTURE) ×6
SUT VIC AB 2-0 CT1 TAPERPNT 27 (SUTURE) ×6 IMPLANT
SUT VLOC 180 0 24IN GS25 (SUTURE) ×4 IMPLANT
SYR 50ML LL SCALE MARK (SYRINGE) ×2 IMPLANT
TOWEL OR 17X26 10 PK STRL BLUE (TOWEL DISPOSABLE) ×2 IMPLANT
TRAY FOLEY CATH 14FRSI W/METER (CATHETERS) ×2 IMPLANT
WATER STERILE IRR 1500ML POUR (IV SOLUTION) ×2 IMPLANT
WRAP KNEE MAXI GEL POST OP (GAUZE/BANDAGES/DRESSINGS) ×4 IMPLANT

## 2014-09-24 NOTE — Plan of Care (Signed)
Problem: Phase I Progression Outcomes Goal: Pain controlled with appropriate interventions Outcome: Completed/Met Date Met:  09/24/14     

## 2014-09-24 NOTE — Plan of Care (Signed)
Problem: Phase I Progression Outcomes Goal: Vital signs/hemodynamically stable Outcome: Completed/Met Date Met:  09/24/14

## 2014-09-24 NOTE — Transfer of Care (Signed)
Immediate Anesthesia Transfer of Care Note  Patient: Susan Houston  Procedure(s) Performed: Procedure(s): BILATERAL OPEN SAPHENOUS NEURECTOMIES AND OPEN LEFT PERIPATELLA OSETOPHYTECTOMY (Bilateral)  Patient Location: PACU  Anesthesia Type:General  Level of Consciousness: awake, alert  and patient cooperative  Airway & Oxygen Therapy: Patient Spontanous Breathing and Patient connected to face mask oxygen  Post-op Assessment: Report given to PACU RN and Post -op Vital signs reviewed and stable  Post vital signs: Reviewed and stable  Complications: No apparent anesthesia complications

## 2014-09-24 NOTE — Plan of Care (Signed)
Problem: Phase I Progression Outcomes Goal: Incision/dressings dry and intact Outcome: Completed/Met Date Met:  09/24/14

## 2014-09-24 NOTE — Interval H&P Note (Signed)
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 After consideration of risks, benefits and other options for treatment, the patient has consented to  Procedure(s): BILATERAL OPEN SAPHENOUS NEURECTOMIES AND OPEN LEFT PERIPATELLA OSETOPHYTECTOMY (Bilateral) as a surgical intervention .  The patient's history has been reviewed, patient examined, no change in status, stable for surgery.  I have reviewed the patient's chart and labs.  Questions were answered to the patient's satisfaction.     Mauri Pole

## 2014-09-24 NOTE — Anesthesia Postprocedure Evaluation (Signed)
  Anesthesia Post-op Note  Patient: Susan Houston  Procedure(s) Performed: Procedure(s) (LRB): BILATERAL OPEN SAPHENOUS NEURECTOMIES AND OPEN LEFT PERIPATELLA OSETOPHYTECTOMY (Bilateral)  Patient Location: PACU  Anesthesia Type: General  Level of Consciousness: awake and alert   Airway and Oxygen Therapy: Patient Spontanous Breathing  Post-op Pain: mild  Post-op Assessment: Post-op Vital signs reviewed, Patient's Cardiovascular Status Stable, Respiratory Function Stable, Patent Airway and No signs of Nausea or vomiting  Last Vitals:  Filed Vitals:   09/24/14 1600  BP: 132/63  Pulse: 64  Temp: 36.6 C  Resp: 14    Post-op Vital Signs: stable   Complications: No apparent anesthesia complications

## 2014-09-24 NOTE — Plan of Care (Signed)
Problem: Phase I Progression Outcomes Goal: Tubes/drains patent Outcome: Completed/Met Date Met:  09/24/14

## 2014-09-24 NOTE — Plan of Care (Signed)
Problem: Consults Goal: General Surgical Patient Education (See Patient Education module for education specifics) Outcome: Completed/Met Date Met:  09/24/14     

## 2014-09-24 NOTE — Brief Op Note (Signed)
09/24/2014  3:05 PM  PATIENT:  Susan Houston  53 y.o. female  PRE-OPERATIVE DIAGNOSIS:  1.  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  painful left peripatella osteophyte  POST-OPERATIVE DIAGNOSIS:  1.  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  painful left peripatella osteophyte versus foreign body (retained non-absorbable suture)  PROCEDURE:  Procedure(s): BILATERAL OPEN SAPHENOUS NEURECTOMIES AND OPEN LEFT PERIPATELLA OSETOPHYTECTOMY (Bilateral)  SURGEON:  Surgeon(s) and Role:    * Mauri Pole, MD - Primary  PHYSICIAN ASSISTANT: Danae Orleans, PA-C  ANESTHESIA:   spinal  EBL:  Total I/O In: 1150 [I.V.:1150] Out: 600 [Urine:500; Blood:100]  BLOOD ADMINISTERED:none  DRAINS: none   LOCAL MEDICATIONS USED:  NONE  SPECIMEN:  No Specimen  DISPOSITION OF SPECIMEN:  N/A  COUNTS:  YES  TOURNIQUET:  * Missing tourniquet times found for documented tourniquets in log:  [id number] * Total Tourniquet Time Documented: Thigh (Left) - 17 minutes Total: Thigh (Left) - 17 minutes  Thigh (Right) - 21 minutes Total: Thigh (Right) - 21 minutes   DICTATION: .Other Dictation: Dictation Number [id number]  PLAN OF CARE: Admit to inpatient   PATIENT DISPOSITION:  PACU - hemodynamically stable.   Delay start of Pharmacological VTE agent (>24hrs) due to surgical blood loss or risk of bleeding: no

## 2014-09-25 ENCOUNTER — Encounter (HOSPITAL_COMMUNITY): Payer: Self-pay | Admitting: Orthopedic Surgery

## 2014-09-25 DIAGNOSIS — G588 Other specified mononeuropathies: Secondary | ICD-10-CM | POA: Diagnosis not present

## 2014-09-25 LAB — BASIC METABOLIC PANEL
Anion gap: 9 (ref 5–15)
BUN: 6 mg/dL (ref 6–23)
CHLORIDE: 104 meq/L (ref 96–112)
CO2: 26 mEq/L (ref 19–32)
Calcium: 9.2 mg/dL (ref 8.4–10.5)
Creatinine, Ser: 0.52 mg/dL (ref 0.50–1.10)
GFR calc Af Amer: >90 mL/min (ref >90)
GFR calc non Af Amer: >90 mL/min (ref >90)
Glucose, Bld: 122 mg/dL — ABNORMAL HIGH (ref 70–99)
Potassium: 5.1 mEq/L (ref 3.7–5.3)
SODIUM: 139 meq/L (ref 137–147)

## 2014-09-25 LAB — CBC
HEMATOCRIT: 38.3 % (ref 36.0–46.0)
HEMOGLOBIN: 12 g/dL (ref 12.0–15.0)
MCH: 29 pg (ref 26.0–34.0)
MCHC: 31.3 g/dL (ref 30.0–36.0)
MCV: 92.5 fL (ref 78.0–100.0)
Platelets: 167 10*3/uL (ref 150–400)
RBC: 4.14 MIL/uL (ref 3.87–5.11)
RDW: 12.4 % (ref 11.5–15.5)
WBC: 6.2 10*3/uL (ref 4.0–10.5)

## 2014-09-25 MED ORDER — ENOXAPARIN SODIUM 40 MG/0.4ML ~~LOC~~ SOLN: 40.0000 mg | SUBCUTANEOUS | Status: DC

## 2014-09-25 MED ORDER — OXYCODONE HCL 5 MG PO TABS
5.0000 mg | ORAL_TABLET | ORAL | Status: DC | PRN
Start: 2014-09-25 — End: 2014-09-25
  Administered 2014-09-25: 5 mg via ORAL
  Administered 2014-09-25 (×2): 15 mg via ORAL
  Administered 2014-09-25: 5 mg via ORAL
  Filled 2014-09-25 (×2): qty 1
  Filled 2014-09-25 (×2): qty 3

## 2014-09-25 MED ORDER — ALPRAZOLAM 0.5 MG PO TABS: 0.5000 mg | ORAL_TABLET | Freq: Three times a day (TID) | ORAL | Status: DC | PRN

## 2014-09-25 MED ORDER — POLYETHYLENE GLYCOL 3350 17 G PO PACK: 17.0000 g | PACK | Freq: Two times a day (BID) | ORAL | Status: DC

## 2014-09-25 MED ORDER — METHOCARBAMOL 500 MG PO TABS: 500.0000 mg | ORAL_TABLET | Freq: Four times a day (QID) | ORAL | Status: DC | PRN

## 2014-09-25 MED ORDER — FERROUS SULFATE 325 (65 FE) MG PO TABS: 325.0000 mg | ORAL_TABLET | Freq: Three times a day (TID) | ORAL | Status: DC

## 2014-09-25 MED ORDER — OXYCODONE HCL 5 MG PO TABS: 5.0000 mg | ORAL_TABLET | ORAL | Status: DC | PRN

## 2014-09-25 MED ORDER — DSS 100 MG PO CAPS: 100.0000 mg | ORAL_CAPSULE | Freq: Two times a day (BID) | ORAL | Status: DC

## 2014-09-25 NOTE — Progress Notes (Signed)
Patient ID: Susan Houston, female   DOB: 1961-06-16, 53 y.o.   MRN: [id number] Subjective: 1 Day Post-Op Procedure(s) (LRB): BILATERAL OPEN SAPHENOUS NEURECTOMIES AND OPEN LEFT PERIPATELLA OSETOPHYTECTOMY (Bilateral)    Patient reports pain as moderate.  Does not feel that the pain meds are helping with her pain  Objective:   VITALS:   Filed Vitals:   09/25/14 1000  BP: 130/66  Pulse: 61  Temp: 97.5 F (36.4 C)  Resp: 14    Neurovascular intact Incision: dressing C/D/I - bilaterally  LABS  Recent Labs  09/24/14 1745 09/25/14 0415  HGB 13.1 12.0  HCT 40.4 38.3  WBC 5.9 6.2  PLT 173 167     Recent Labs  09/24/14 1745 09/25/14 0415  NA  --  139  K  --  5.1  BUN  --  6  CREATININE 0.60 0.52  GLUCOSE  --  122*    No results for input(s): LABPT, INR in the last 72 hours.   Assessment/Plan: 1 Day Post-Op Procedure(s) (LRB): BILATERAL OPEN SAPHENOUS NEURECTOMIES AND OPEN LEFT PERIPATELLA OSETOPHYTECTOMY (Bilateral)   Advance diet Up with therapy Plan for discharge tomorrow   Will change pain meds to oxycodone to try and help with pain, will add anxiolytic medication On Trazadone at night for sleep  Will she how she progresses with activity with therapy to determine disposition

## 2014-09-25 NOTE — Evaluation (Addendum)
Occupational Therapy Evaluation Patient Details Name: AYODELE HARTSOCK MRN: [id number] DOB: [birth year] Today's Date: 09/25/2014    History of Present Illness s/p bil excision of saphenous nerve branches anteromedial aspect of knees, open excision L peripatellar osteophyte 09/24/14. Hx of R, L TKAs.    Clinical Impression   This 53 year old female was admitted for the above surgeries.  She will benefit from skilled OT to work on ambulating to bathroom and releasing a hand in standing for adls.  Pt will have 24/7.  She was limited by pain during OT evaluation.  Goals in acute are for min guard level. She was independent prior to admission    Follow Up Recommendations  Supervision/Assistance - 24 hour    Equipment Recommendations  None recommended by OT    Recommendations for Other Services       Precautions / Restrictions Precautions Precautions: Fall Restrictions RLE Weight Bearing: Weight bearing as tolerated LLE Weight Bearing: Weight bearing as tolerated      Mobility Bed Mobility   Bed Mobility: Supine to Sit     Supine to sit: Min assist;HOB elevated     General bed mobility comments: Increased time. Assist for L LE. Heavy reliance on bedrail.  Transfers   Equipment used: Conservation officer, nature (2 wheeled) Transfers: Sit to/from [employer] to Stand: Min assist Stand pivot transfers: Min assist       General transfer comment: assist to rise, steady, and control descent.  VCs for technique.  Pt shaky when standing    Balance                                            ADL Overall ADL's : Needs assistance/impaired     Grooming: Set up;Sitting   Upper Body Bathing: Set up;Sitting   Lower Body Bathing: Moderate assistance;Sit to/from stand   Upper Body Dressing : Minimal assistance;Sitting (iv)   Lower Body Dressing: Maximal assistance;Sit to/from stand   Toilet Transfer: Minimal assistance;Stand-pivot (to recliner)              General ADL Comments: Pt limited by pain.  She does not have AE at home, but she will have assistance for ADLs.  Talked about tub readiness:  pt is not ready to step over tub yet, and she is agreeable to sponge bathing.  Pt with bil UE weakness, L worse than R.  Provided yellow theraband for LUE and orange for RUE.  Written HEP left in room and reviewed with pt.     Vision                     Perception     Praxis      Pertinent Vitals/Pain Pain Score: 7  Pain Location: L knee Pain Intervention(s): Limited activity within patient's tolerance;Monitored during session;Repositioned;Premedicated before session (declined ice:  had recently used it)     Hand Dominance     Extremity/Trunk Assessment Upper Extremity Assessment Upper Extremity Assessment: Generalized weakness           Communication Communication Communication: No difficulties   Cognition Arousal/Alertness: Awake/alert Behavior During Therapy: WFL for tasks assessed/performed Overall Cognitive Status: Within Functional Limits for tasks assessed                     General Comments       Exercises  Other Exercises Other Exercises: theraband exercises for FF, shoulder extension, biceps and triceps.  Performed one set of 10 each.  Level 1 theraband for LUE and Level 2 for RUE   Shoulder Instructions      Home Living Family/patient expects to be discharged to:: Private residence Living Arrangements: Spouse/significant other Available Help at Discharge: Family Type of Home: House       Home Layout: One level     Bathroom Shower/Tub: Tub/shower unit Shower/tub characteristics: Architectural technologist: Standard     Home Equipment: Shower seat;Bedside commode          Prior Functioning/Environment Level of Independence: Independent             OT Diagnosis: Generalized weakness;Acute pain   OT Problem List: Decreased strength;Decreased activity tolerance;Decreased  safety awareness;Decreased knowledge of use of DME or AE;Pain   OT Treatment/Interventions: Self-care/ADL training;DME and/or AE instruction;Patient/family education    OT Goals(Current goals can be found in the care plan section) Acute Rehab OT Goals Patient Stated Goal: less pain OT Goal Formulation: With patient Time For Goal Achievement: 10/02/14 Potential to Achieve Goals: Good ADL Goals Pt Will Transfer to Toilet: with min guard assist;ambulating;bedside commode Pt Will Perform Toileting - Clothing Manipulation and hygiene: with min guard assist;sit to/from stand  OT Frequency: Min 2X/week   Barriers to D/C:            Co-evaluation              End of Session    Activity Tolerance: Patient limited by pain Patient left: in chair;with call bell/phone within reach   Time: 1327-1401 OT Time Calculation (min): 34 min Charges:  OT General Charges $OT Visit: 1 Procedure OT Evaluation $Initial OT Evaluation Tier I: 1 Procedure OT Treatments $Self Care/Home Management : 8-22 mins $Therapeutic Exercise: 8-22 mins G-Codes: OT G-codes **NOT FOR INPATIENT CLASS** Functional Assessment Tool Used: clinical judgment and observation Functional Limitation: Self care Self Care Current Status (H0623): At least 60 percent but less than 80 percent impaired, limited or restricted Self Care Goal Status (J6283): At least 20 percent but less than 40 percent impaired, limited or restricted  Gabi Mcfate 09/25/2014, 2:22 PM  Lesle Chris, OTR/L [phone] 09/25/2014

## 2014-09-25 NOTE — Care Management Note (Signed)
    Page 1 of 2   09/25/2014     2:04:32 PM CARE MANAGEMENT NOTE 09/25/2014  Patient:  MADISAN, BICE   Account Number:  [id number]  Date Initiated:  09/25/2014  Documentation initiated by:  [facility]  Subjective/Objective Assessment:   adm: BILATERAL OPEN SAPHENOUS NEURECTOMIES AND OPEN LEFT PERIPATELLA OSETOPHYTECTOMY (Bilateral)     Action/Plan:   discharge planning   Anticipated DC Date:  09/25/2014   Anticipated DC Plan:  [place]  CM consult      [facility] Choice  HOME HEALTH   Choice offered to / List presented to:  C-1 Patient   DME arranged  NA      DME agency  NA     [place] arranged  HH-2 PT      [place]   Status of service:  Completed, signed off Medicare Important Message given?   (If response is "NO", the following Medicare IM given date fields will be blank) Date Medicare IM given:   Medicare IM given by:   Date Additional Medicare IM given:   Additional Medicare IM given by:    Discharge Disposition:  [place]  Per UR Regulation:    If discussed at Long Length of Stay Meetings, dates discussed:    Comments:  09/25/14 14:00 CM met with pt in room to offer choice of home health agency.  Pt chooses [facility] to render HHPT.  NO DME is needed as pt has both rolling walker and 3n1. Address and contact information verified with pt.  Referral called to [facility].  No other CM needs were communicated.  Mariane Masters, BSN, [place].

## 2014-09-25 NOTE — Op Note (Signed)
Susan Houston, Susan Houston                   ACCOUNT NO.:  [id number]  MEDICAL RECORD NO.:  [id number]  LOCATION:  5361                         FACILITY:  [facility]  PHYSICIAN:  Pietro Cassis. Alvan Dame, M.D.  DATE OF BIRTH:  1961-06-06  DATE OF PROCEDURE:  09/24/2014 DATE OF DISCHARGE:                              OPERATIVE REPORT   PREOPERATIVE DIAGNOSIS: 1. Status post bilateral total knee arthroplasties with persistent     anterior medial base pain consistent with saphenous nerve branch     neuralgias. 2. Parapatellar osteophyte or bony complex along the medial border     that was pinned that remained persistently painful.  PROCEDURES: 1. Excision of painful saphenous nerve branches along the anterior,     medial, and proximal tibia in the medial aspect incisions. 2. Open excision of left parapatellar osteophyte versus excision of     old retained nonabsorbable sutures along the medial border of the     patella.  SURGEON:  Pietro Cassis. Alvan Dame, M.D.  ASSISTANT:  Danae Orleans, PA-C.  ANESTHESIA:  Spinal.  SPECIMENS:  None.  TOURNIQUET:  The right knee tourniquet was up for 21 minutes at 250 mmHg.  Left knee was up for 16 minutes at 250 mmHg.  DRAINS:  None.  COMPLICATION:  None.  BLOOD LOSS:  Minimal.  INDICATIONS FOR PROCEDURE:  Ms. Newmann is a 53 year old female who had presented to the office for second opinion evaluation of persistent knee pain.  Clinical examination revealed persistent tenderness along the anterior and medial aspect of the knee.  Workup was negative for infection including lab work as well as negative bone scan consistent with stable components.  She did on exam have this palpable medial border of her left patella, not the right.  Other exam findings were consistent with this tenderness over the proximal and medial tibia bilaterally, which was resolved using diagnostically performed 5 mL of Marcaine.  With this, we recognized enough resolution of her symptoms that it  insists that we pursue with surgery as the symptoms recurred following the wearing off of the medicine.  I reviewed with her the risks of persistent pain.  Though we have ruled out loosening or infectious process, she could have recurrence of any sensory branch pain along this area of the knee.  The risks of infection were discussed. The risks and benefits were reviewed of excising the medial border of the patella as a potential source of her pain as well.  Consent was obtained for benefit of pain relief in both knees.  PROCEDURE IN DETAIL:  The patient was brought to the operative theater. Once adequate anesthesia, preoperative antibiotics, Ancef administered, she was positioned supine.  Bilateral thigh tourniquets were placed. The left lower extremity was placed into the [facility] leg holder.  Time- out was performed identifying both extremities, both planned procedures in the patient.  Attention was first directed to the left knee.  The left lower extremity was exsanguinated.  Tourniquet elevated to 250 mmHg.  Her old incision was excised.  She was noted to have very thin skin.  Careful soft tissue dissection was carried out over the proximal and medial tibia and laterally  to expose the soft tissues.  Though she had a very little subcutaneous fat, I was able to carefully excise the nerve-containing adipose tissue in the proximal and medial tibia.  I had to clear out the area of hypersensitivity pre-identified in the holding area.  Following this on the left knee, I made a median arthrotomy.  Upon soft tissue dissection and exposure, we identified that she had 4 to 6 prominent nonabsorbable sutures along the medial border of the tibia. As I excised these, they became less prominent concerning of medial peripatellar osteophytes.  Nonetheless, I unroofed the medial border of the patella and used the rongeur to perform an exostectomy of the patella.  We removed all remaining  nonabsorbable suture pieces that are visible medially.  Following these procedures, I irrigated the knee with normal saline solution about 500 mL.  Then we reapproximated extensor mechanism using #1 Vicryl and then a running 0 V-Loc suture.  Following this, the remainder of the wound was closed with 2-0 Vicryl and running 4-0 Monocryl.  As this knee was being closed, I directed attention to the right knee. Second time-out was performed identifying the patient, planned procedure, extremity.  Leg was exsanguinated, tourniquet elevated to 250 mmHg.  The same incision type was carried out excising old incision, again noting very thin soft skin and subcutaneous tissue layer.  The subcutaneous fat containing the nerves of the saphenous nerve branch were identified and then the area pre-identified in the holding area lifted out down to the capsule layer.  On this right knee, I did identify a prominent piece of bone along the proximal tibia.  I opened this up within the capsule and excised this osteophyte on the anterior and medial tibia to prevent any potential further source of aggravation medially in this knee.  I irrigated this wound, then reapproximated those edges using #1 Vicryl.  The remainder of this right knee was closed with 2-0 Vicryl and running 4-0 Monocryl. Both knees were cleaned, dried, and dressed sterilely using surgical glue and then Aquacel dressing.  She was then brought into the recovery room in stable condition, tolerating the procedure well.  She will be seen and evaluated by Physical Therapy.  She will be weightbearing as tolerated.  Her only recovery will involve wound healing.     Pietro Cassis Alvan Dame, M.D.     MDO/MEDQ  D:  09/24/2014  T:  09/25/2014  Job:  [id number]

## 2014-09-25 NOTE — Plan of Care (Signed)
Problem: Phase I Progression Outcomes Goal: OOB as tolerated unless otherwise ordered Outcome: Completed/Met Date Met:  09/25/14 Goal: Sutures/staples intact Outcome: Not Applicable Date Met:  39/68/86 Goal: Initial discharge plan identified Outcome: Completed/Met Date Met:  09/25/14 Goal: Other Phase I Outcomes/Goals Outcome: Not Applicable Date Met:  48/47/20

## 2014-09-25 NOTE — Progress Notes (Signed)
RN reviewed discharge instructions with patient and family. All questions answered.   Paperwork and prescriptions given.   NT rolled patient down to family car.

## 2014-09-25 NOTE — Progress Notes (Addendum)
Physical Therapy Treatment Patient Details Name: Susan Houston MRN: [id number] DOB: May 02, 1961 Today's Date: 09/25/2014    History of Present Illness s/p bil excision of saphenous nerve branches anteromedial aspect of knees, open excision L peripatellar osteophyte 09/24/14. Hx of R, L TKAs.     PT Comments    Improved mobility and activity tolerance this session. Pt able to walk ~75 feet with RW and climb 2 steps with use of rail. Min assist overall needed. Feel pt is okay to d/c home with husband assisting if MD approves.  Follow Up Recommendations  Home health PT;Supervision/Assistance - 24 hour     Equipment Recommendations  None recommended by PT    Recommendations for Other Services       Precautions / Restrictions Precautions Precautions: Fall Restrictions Weight Bearing Restrictions: No RLE Weight Bearing: Weight bearing as tolerated LLE Weight Bearing: Weight bearing as tolerated    Mobility  Bed Mobility Overal bed mobility: Needs Assistance Bed Mobility: Sit to Supine     Supine to sit: Min assist;HOB elevated Sit to supine: Min assist   General bed mobility comments: Assist for L LE  Transfers Overall transfer level: Needs assistance Equipment used: Rolling walker (2 wheeled) Transfers: Sit to/from Stand Sit to Stand: Min assist Stand pivot transfers: Min guard       General transfer comment: assist to rise, steady.  VCs for technique.  Pt shaky when standing  Ambulation/Gait Ambulation/Gait assistance: Min guard Ambulation Distance (Feet): 75 Feet Assistive device: Rolling walker (2 wheeled) Gait Pattern/deviations: Step-to pattern;Step-through pattern;Antalgic     General Gait Details: close guard for safety. VCSs for safety, technique, sequence.    Stairs Stairs: Yes Stairs assistance: Min assist Stair Management: Two rails;Step to pattern;Forwards Number of Stairs: 2 General stair comments: VCS safety, technique, sequence. Assist to  stabilize. Encourged "up with good/less painful, down with bad/more painful"  Wheelchair Mobility    Modified Rankin (Stroke Patients Only)       Balance                                    Cognition Arousal/Alertness: Awake/alert Behavior During Therapy: WFL for tasks assessed/performed Overall Cognitive Status: Within Functional Limits for tasks assessed                      Exercises Other Exercises Other Exercises: theraband exercises for FF, shoulder extension, biceps and triceps.  Performed one set of 10 each.  Level 1 theraband for LUE and Level 2 for RUE    General Comments        Pertinent Vitals/Pain Pain Assessment: 0-10 Pain Score: 7  Pain Location: L knee Pain Descriptors / Indicators: Sore;Aching Pain Intervention(s): Monitored during session;Ice applied;Repositioned    Home Living Family/patient expects to be discharged to:: Private residence Living Arrangements: Spouse/significant other Available Help at Discharge: Family Type of Home: House     Home Layout: One level Home Equipment: Shower seat;Bedside commode      Prior Function Level of Independence: Independent          PT Goals (current goals can now be found in the care plan section) Acute Rehab PT Goals Patient Stated Goal: less pain Progress towards PT goals: Progressing toward goals    Frequency  Min 5X/week    PT Plan Current plan remains appropriate    Co-evaluation  End of Session Equipment Utilized During Treatment: Gait belt Activity Tolerance: Patient limited by pain Patient left: in bed;with call bell/phone within reach     Time: 1423-1440 PT Time Calculation (min) (ACUTE ONLY): 17 min  Charges:  $Gait Training: 8-22 mins                    G Codes:  Functional Assessment Tool Used: clinical judgement Functional Limitation: Mobility: Walking and moving around Mobility: Walking and Moving Around Current Status [phone]): At  least 1 percent but less than 20 percent impaired, limited or restricted Mobility: Walking and Moving Around Goal Status [phone]): At least 1 percent but less than 20 percent impaired, limited or restricted Mobility: Walking and Moving Around Discharge Status [phone]): At least 1 percent but less than 20 percent impaired, limited or restricted   Weston Anna, MPT Pager: [phone]

## 2014-09-25 NOTE — Evaluation (Signed)
Physical Therapy Evaluation Patient Details Name: Susan Houston MRN: [id number] DOB: 12/19/1960 Today's Date: 09/25/2014   History of Present Illness  /p bil excision of saphenous nerve branches anteromedial aspect of knees, open excision L peripatellar osteophyte 09/24/14. Hx of R, L TKAs.   Clinical Impression  Limited eval due to severe pain L LE. Pt able to pivot to recliner with significant difficulty. Will plan to see a 2nd time today to see if pain control is any better/if pt can progress activity. Unsure if pt will be able to safely d/c home on today.     Follow Up Recommendations Home health PT;Supervision/Assistance - 24 hour    Equipment Recommendations  None recommended by PT    Recommendations for Other Services       Precautions / Restrictions Precautions Precautions: Fall Restrictions Weight Bearing Restrictions: No RLE Weight Bearing: Weight bearing as tolerated LLE Weight Bearing: Weight bearing as tolerated      Mobility  Bed Mobility Overal bed mobility: Needs Assistance Bed Mobility: Supine to Sit     Supine to sit: Min assist;HOB elevated     General bed mobility comments: Increased time. Assist for L LE. Heavy reliance on bedrail.  Transfers Overall transfer level: Needs assistance Equipment used: Rolling walker (2 wheeled) Transfers: Sit to/from [employer] Sit to Stand: Min assist;From elevated surface Stand pivot transfers: Min assist       General transfer comment: assist to rise, stabilize, control descent. VCs safety, technqiue, hand placement. Pt had significant difficulty pivoting to recliner.   Ambulation/Gait                Stairs            Wheelchair Mobility    Modified Rankin (Stroke Patients Only)       Balance                                             Pertinent Vitals/Pain Pain Assessment: 0-10 Pain Score: 10-Worst pain ever Pain Location: L knee with activity Pain  Intervention(s): Repositioned;Monitored during session;Patient requesting pain meds-RN notified    Home Living Family/patient expects to be discharged to:: Private residence Living Arrangements: Spouse/significant other Available Help at Discharge: Family Type of Home: House Home Access: Stairs to enter Entrance Stairs-Rails: Right;Left;Can reach both Technical brewer of Steps: 3 Home Layout: One level Home Equipment: [place] - single point      Prior Function                 Hand Dominance        Extremity/Trunk Assessment   Upper Extremity Assessment: Overall WFL for tasks assessed           Lower Extremity Assessment: RLE deficits/detail;LLE deficits/detail RLE Deficits / Details: at least hip flex 3/5, hip abd/add 3/5, moves ankle well    Cervical / Trunk Assessment: Normal  Communication   Communication: No difficulties  Cognition Arousal/Alertness: Awake/alert Behavior During Therapy: WFL for tasks assessed/performed Overall Cognitive Status: Within Functional Limits for tasks assessed                      General Comments      Exercises        Assessment/Plan    PT Assessment Patient needs continued PT services  PT Diagnosis Difficulty walking;Abnormality of gait;Acute pain  PT Problem List Decreased strength;Decreased range of motion;Decreased activity tolerance;Decreased balance;Decreased mobility;Decreased knowledge of use of DME;Pain  PT Treatment Interventions DME instruction;Gait training;Functional mobility training;Therapeutic activities;Stair training;Patient/family education;Balance training;Therapeutic exercise   PT Goals (Current goals can be found in the Care Plan section) Acute Rehab PT Goals Patient Stated Goal: less pain PT Goal Formulation: With patient Time For Goal Achievement: 10/02/14 Potential to Achieve Goals: Good    Frequency Min 5X/week   Barriers to discharge        Co-evaluation                End of Session Equipment Utilized During Treatment: Gait belt Activity Tolerance: Patient limited by pain Patient left: in chair;with call bell/phone within reach      Functional Assessment Tool Used: clinical judgement Functional Limitation: Mobility: Walking and moving around Mobility: Walking and Moving Around Current Status (S9702): At least 20 percent but less than 40 percent impaired, limited or restricted Mobility: Walking and Moving Around Goal Status [phone]): At least 1 percent but less than 20 percent impaired, limited or restricted    Time: 0850-0909 PT Time Calculation (min) (ACUTE ONLY): 19 min   Charges:   PT Evaluation $Initial PT Evaluation Tier I: 1 Procedure PT Treatments $Therapeutic Activity: 8-22 mins   PT G Codes:   Functional Assessment Tool Used: clinical judgement Functional Limitation: Mobility: Walking and moving around    Weston Anna, MPT Pager: [phone]

## 2014-09-25 NOTE — Progress Notes (Signed)
UR completed 

## 2014-09-30 NOTE — Discharge Summary (Signed)
Physician Discharge Summary  Patient ID: Susan Houston MRN: [id number] DOB/AGE: 1961-02-05 53 y.o.  Admit date: 09/24/2014 Discharge date: 09/25/2014   Procedures:  Procedure(s) (LRB): BILATERAL OPEN SAPHENOUS NEURECTOMIES AND OPEN LEFT PERIPATELLA OSETOPHYTECTOMY (Bilateral)  Attending Physician:  Dr. Paralee Cancel   Admission Diagnoses:   Bilateral knee pain S/P bilateral TKAs   Discharge Diagnoses:  Principal Problem:   Saphenous neuralgia  Past Medical History  Diagnosis Date  . COPD (chronic obstructive pulmonary disease)   . Arthritis   . Bruises easily   . History of kidney stones   . Incontinence of urine   . Difficulty sleeping   . Depression   . Anxiety   . 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 Back pain   . Chronic leg pain     BILATERAL    HPI: Pt is a 53 y.o. female complaining of bilateral knee pain for many years. She has had both of her knees replaced many years ago in [facility]. She has had pain since the surgeries that has gradually increased. X-rays in the clinic show bilateral total knee arthroplasties and an anteromedial patellar osteophyte on the left. Pt has tried various conservative treatments which have failed to alleviate their symptoms, including cortisone injections, assist devices and activity modification. The predominant findings were right greater than left tenderness along the proximal medial tibia, for which diagnostic Marcaine injections were carried out. With the injections she noted significant improvement in her symptoms over the anterior aspect of her knee. Various options are discussed with the patient. Risks, benefits and expectations were discussed with the patient. Risks including but not limited to the risk of anesthesia, blood clots, nerve damage, blood vessel damage, failure of the prosthesis, infection and up to and including death. Patient understand the risks, benefits and  expectations and wishes to proceed with surgery.   PCP: Golden Pop, MD   Discharged Condition: good  Hospital Course:  Patient underwent the above stated procedure on 09/24/2014. Patient tolerated the procedure well and brought to the recovery room in good condition and subsequently to the floor.  POD #1 BP: 130/66 ; Pulse: 61 ; Temp: 97.5 F (36.4 C) ; Resp: 14 Patient reports pain as moderate. No events throughout the night. Ready to be discharged home. Dorsiflexion/plantar flexion intact, incision: dressing C/D/I, no cellulitis present and compartment soft.   LABS  Basename    HGB  12.0  HCT  38.3    Discharge Exam: General appearance: alert, cooperative and no distress Extremities: Homans sign is negative, no sign of DVT, no edema, redness or tenderness in the calves or thighs and no ulcers, gangrene or trophic changes  Disposition: Home with follow up in 2 weeks   Follow-up Information    Follow up with Mauri Pole, MD. Schedule an appointment as soon as possible for a visit in 2 weeks.   Specialty:  Orthopedic Surgery   Contact information:   [address] [place] [phone]       Follow up with [facility].   Why:  home health physical therapy   Contact information:   [address] [place] [address] [phone]       Discharge Instructions    Call MD / Call 911    Complete by:  As directed   If you experience chest pain or shortness of breath, CALL 911 and be transported to the  hospital emergency room.  If you develope a fever above 101 F, pus (white drainage) or increased drainage or redness at the wound, or calf pain, call your surgeon's office.     Change dressing    Complete by:  As directed   Maintain surgical dressing for 10-14 days, or until follow up in the clinic.     Constipation Prevention    Complete by:  As directed   Drink plenty of fluids.  Prune juice may be helpful.  You may  use a stool softener, such as Colace (over the counter) 100 mg twice a day.  Use MiraLax (over the counter) for constipation as needed.     Diet - low sodium heart healthy    Complete by:  As directed      Discharge instructions    Complete by:  As directed   Maintain surgical dressing for 10-14 days, or until follow up in the clinic. Follow up in 2 weeks at [facility]. Call with any questions or concerns.     Increase activity slowly as tolerated    Complete by:  As directed      TED hose    Complete by:  As directed   Use stockings (TED hose) for 2 weeks on both leg(s).  You may remove them at night for sleeping.     Weight bearing as tolerated    Complete by:  As directed   Laterality:  bilateral  Extremity:  Lower             Medication List    STOP taking these medications        aspirin-acetaminophen-caffeine 250-250-65 MG per tablet  Commonly known as:  EXCEDRIN MIGRAINE      TAKE these medications        BELSOMRA 10 MG Tabs  Generic drug:  Suvorexant  Take 1 tablet by mouth at bedtime as needed.     betamethasone dipropionate 0.05 % cream  Commonly known as:  DIPROLENE  Apply 1 application topically 2 (two) times daily.     busPIRone 10 MG tablet  Commonly known as:  BUSPAR  Take 10 mg by mouth 2 (two) times daily.     clotrimazole-betamethasone cream  Commonly known as:  LOTRISONE  Apply 1 application topically 2 (two) times daily.     DSS 100 MG Caps  Take 100 mg by mouth 2 (two) times daily.     enoxaparin 40 MG/0.4ML injection  Commonly known as:  LOVENOX  Inject 0.4 mLs (40 mg total) into the skin daily.     ferrous sulfate 325 (65 FE) MG tablet  Take 1 tablet (325 mg total) by mouth 3 (three) times daily after meals.     fluticasone 50 MCG/ACT nasal spray  Commonly known as:  FLONASE  Place 2 sprays into both nostrils every morning.     Fluticasone-Salmeterol 500-50 MCG/DOSE Aepb  Commonly known as:  ADVAIR  Inhale 2 puffs into  the lungs 2 (two) times daily.     gabapentin 300 MG capsule  Commonly known as:  NEURONTIN  Take 300 mg by mouth 3 (three) times daily.     loperamide 2 MG tablet  Commonly known as:  IMODIUM A-D  Take 2 mg by mouth 4 (four) times daily as needed for diarrhea or loose stools.     LORazepam 0.5 MG tablet  Commonly known as:  ATIVAN  Take 0.5 mg by mouth 2 (two) times daily.  methocarbamol 500 MG tablet  Commonly known as:  ROBAXIN  Take 1 tablet (500 mg total) by mouth every 6 (six) hours as needed for muscle spasms.     oxyCODONE 5 MG immediate release tablet  Commonly known as:  Oxy IR/ROXICODONE  Take 1-3 tablets (5-15 mg total) by mouth every 4 (four) hours as needed for severe pain.     polyethylene glycol packet  Commonly known as:  MIRALAX / GLYCOLAX  Take 17 g by mouth 2 (two) times daily.     sertraline 100 MG tablet  Commonly known as:  ZOLOFT  Take 200 mg by mouth daily.     topiramate 50 MG tablet  Commonly known as:  TOPAMAX  Take 50 mg by mouth 2 (two) times daily.     traZODone 100 MG tablet  Commonly known as:  DESYREL  Take 200-300 mg by mouth at bedtime as needed for sleep.     WELLBUTRIN SR 100 MG 12 hr tablet  Generic drug:  buPROPion  Take 100 mg by mouth every morning.         Signed: West Pugh. Keyondra Lagrand   PA-C  09/30/2014, 9:44 PM

## 2014-11-14 ENCOUNTER — Ambulatory Visit: Payer: Self-pay | Admitting: Pain Medicine

## 2014-11-14 DIAGNOSIS — Z79891 Long term (current) use of opiate analgesic: Secondary | ICD-10-CM | POA: Diagnosis not present

## 2014-11-14 DIAGNOSIS — M533 Sacrococcygeal disorders, not elsewhere classified: Secondary | ICD-10-CM | POA: Diagnosis not present

## 2014-11-14 DIAGNOSIS — J45909 Unspecified asthma, uncomplicated: Secondary | ICD-10-CM | POA: Diagnosis not present

## 2014-11-14 DIAGNOSIS — I10 Essential (primary) hypertension: Secondary | ICD-10-CM | POA: Diagnosis not present

## 2014-11-14 DIAGNOSIS — E785 Hyperlipidemia, unspecified: Secondary | ICD-10-CM | POA: Diagnosis not present

## 2014-11-14 DIAGNOSIS — Z8521 Personal history of malignant neoplasm of larynx: Secondary | ICD-10-CM | POA: Diagnosis not present

## 2014-11-14 DIAGNOSIS — F329 Major depressive disorder, single episode, unspecified: Secondary | ICD-10-CM | POA: Diagnosis not present

## 2014-11-14 DIAGNOSIS — F1120 Opioid dependence, uncomplicated: Secondary | ICD-10-CM | POA: Diagnosis not present

## 2014-11-14 DIAGNOSIS — Z79899 Other long term (current) drug therapy: Secondary | ICD-10-CM | POA: Diagnosis not present

## 2014-11-14 DIAGNOSIS — M47817 Spondylosis without myelopathy or radiculopathy, lumbosacral region: Secondary | ICD-10-CM | POA: Diagnosis not present

## 2014-11-14 DIAGNOSIS — G894 Chronic pain syndrome: Secondary | ICD-10-CM | POA: Diagnosis not present

## 2014-11-14 DIAGNOSIS — M5416 Radiculopathy, lumbar region: Secondary | ICD-10-CM | POA: Diagnosis not present

## 2014-11-14 DIAGNOSIS — J449 Chronic obstructive pulmonary disease, unspecified: Secondary | ICD-10-CM | POA: Diagnosis not present

## 2014-11-14 DIAGNOSIS — M545 Low back pain: Secondary | ICD-10-CM | POA: Diagnosis not present

## 2014-11-19 DIAGNOSIS — Z79899 Other long term (current) drug therapy: Secondary | ICD-10-CM | POA: Diagnosis not present

## 2014-11-21 ENCOUNTER — Ambulatory Visit: Payer: Self-pay | Admitting: Pain Medicine

## 2014-11-21 DIAGNOSIS — M47817 Spondylosis without myelopathy or radiculopathy, lumbosacral region: Secondary | ICD-10-CM | POA: Diagnosis not present

## 2014-11-21 DIAGNOSIS — M545 Low back pain: Secondary | ICD-10-CM | POA: Diagnosis not present

## 2014-11-21 DIAGNOSIS — Z8521 Personal history of malignant neoplasm of larynx: Secondary | ICD-10-CM | POA: Diagnosis not present

## 2014-11-21 DIAGNOSIS — E785 Hyperlipidemia, unspecified: Secondary | ICD-10-CM | POA: Diagnosis not present

## 2014-11-21 DIAGNOSIS — J449 Chronic obstructive pulmonary disease, unspecified: Secondary | ICD-10-CM | POA: Diagnosis not present

## 2014-11-21 DIAGNOSIS — F329 Major depressive disorder, single episode, unspecified: Secondary | ICD-10-CM | POA: Diagnosis not present

## 2014-11-21 DIAGNOSIS — I10 Essential (primary) hypertension: Secondary | ICD-10-CM | POA: Diagnosis not present

## 2014-11-21 DIAGNOSIS — J45909 Unspecified asthma, uncomplicated: Secondary | ICD-10-CM | POA: Diagnosis not present

## 2014-11-22 DIAGNOSIS — M4716 Other spondylosis with myelopathy, lumbar region: Secondary | ICD-10-CM | POA: Diagnosis not present

## 2014-11-22 DIAGNOSIS — M170 Bilateral primary osteoarthritis of knee: Secondary | ICD-10-CM | POA: Diagnosis not present

## 2014-12-05 ENCOUNTER — Ambulatory Visit: Payer: Self-pay | Admitting: Pain Medicine

## 2014-12-05 DIAGNOSIS — Z96653 Presence of artificial knee joint, bilateral: Secondary | ICD-10-CM | POA: Diagnosis not present

## 2014-12-05 DIAGNOSIS — F329 Major depressive disorder, single episode, unspecified: Secondary | ICD-10-CM | POA: Diagnosis not present

## 2014-12-05 DIAGNOSIS — M545 Low back pain: Secondary | ICD-10-CM | POA: Diagnosis not present

## 2014-12-05 DIAGNOSIS — Z8521 Personal history of malignant neoplasm of larynx: Secondary | ICD-10-CM | POA: Diagnosis not present

## 2014-12-05 DIAGNOSIS — F17200 Nicotine dependence, unspecified, uncomplicated: Secondary | ICD-10-CM | POA: Diagnosis not present

## 2014-12-05 DIAGNOSIS — I10 Essential (primary) hypertension: Secondary | ICD-10-CM | POA: Diagnosis not present

## 2014-12-05 DIAGNOSIS — M1990 Unspecified osteoarthritis, unspecified site: Secondary | ICD-10-CM | POA: Diagnosis not present

## 2014-12-05 DIAGNOSIS — M47817 Spondylosis without myelopathy or radiculopathy, lumbosacral region: Secondary | ICD-10-CM | POA: Diagnosis not present

## 2014-12-05 DIAGNOSIS — E785 Hyperlipidemia, unspecified: Secondary | ICD-10-CM | POA: Diagnosis not present

## 2014-12-20 ENCOUNTER — Ambulatory Visit: Payer: Self-pay | Admitting: Pain Medicine

## 2014-12-20 DIAGNOSIS — M5416 Radiculopathy, lumbar region: Secondary | ICD-10-CM | POA: Diagnosis not present

## 2014-12-20 DIAGNOSIS — M461 Sacroiliitis, not elsewhere classified: Secondary | ICD-10-CM | POA: Diagnosis not present

## 2014-12-20 DIAGNOSIS — M47817 Spondylosis without myelopathy or radiculopathy, lumbosacral region: Secondary | ICD-10-CM | POA: Diagnosis not present

## 2014-12-20 DIAGNOSIS — M5126 Other intervertebral disc displacement, lumbar region: Secondary | ICD-10-CM | POA: Diagnosis not present

## 2014-12-20 DIAGNOSIS — M791 Myalgia: Secondary | ICD-10-CM | POA: Diagnosis not present

## 2014-12-20 DIAGNOSIS — M47896 Other spondylosis, lumbar region: Secondary | ICD-10-CM | POA: Diagnosis not present

## 2015-01-09 ENCOUNTER — Ambulatory Visit: Payer: Self-pay | Admitting: Pain Medicine

## 2015-01-09 DIAGNOSIS — E785 Hyperlipidemia, unspecified: Secondary | ICD-10-CM | POA: Diagnosis not present

## 2015-01-09 DIAGNOSIS — F329 Major depressive disorder, single episode, unspecified: Secondary | ICD-10-CM | POA: Diagnosis not present

## 2015-01-09 DIAGNOSIS — J449 Chronic obstructive pulmonary disease, unspecified: Secondary | ICD-10-CM | POA: Diagnosis not present

## 2015-01-09 DIAGNOSIS — I10 Essential (primary) hypertension: Secondary | ICD-10-CM | POA: Diagnosis not present

## 2015-01-09 DIAGNOSIS — F17200 Nicotine dependence, unspecified, uncomplicated: Secondary | ICD-10-CM | POA: Diagnosis not present

## 2015-01-09 DIAGNOSIS — M47817 Spondylosis without myelopathy or radiculopathy, lumbosacral region: Secondary | ICD-10-CM | POA: Diagnosis not present

## 2015-01-09 DIAGNOSIS — M545 Low back pain: Secondary | ICD-10-CM | POA: Diagnosis not present

## 2015-01-09 DIAGNOSIS — Z8521 Personal history of malignant neoplasm of larynx: Secondary | ICD-10-CM | POA: Diagnosis not present

## 2015-01-18 DIAGNOSIS — M542 Cervicalgia: Secondary | ICD-10-CM | POA: Diagnosis not present

## 2015-01-18 DIAGNOSIS — M791 Myalgia: Secondary | ICD-10-CM | POA: Diagnosis not present

## 2015-02-08 NOTE — Op Note (Signed)
PATIENT NAME:  Susan Houston, Susan Houston MR#:  [id number] DATE OF BIRTH:  [birth year]  DATE OF PROCEDURE:  10/03/2013  PREOPERATIVE DIAGNOSIS: Mixed urinary incontinence.   POSTOPERATIVE DIAGNOSIS: Mixed urinary incontinence.   PROCEDURES:  Anterior colporrhaphy with pubovaginal sling using the TVT Exact device and cystoscopy.   SURGEON: Barnett Applebaum, M.D.   ANESTHESIA: General.   ESTIMATED BLOOD LOSS: 50 mL.   COMPLICATIONS: None.   FINDINGS: On cystoscopy there was noted a right penetration of this trocar on first pass and after second pass there was no penetration or laceration to the bladder and there was no bleeding or extension in this area from prior placement. Left trocar placement normal. Urine is clear without bleeding at the conclusion of the case.   DISPOSITION: To the recovery room in stable condition.   TECHNIQUE: The patient is prepped and draped in the usual sterile fashion after adequate anesthesia is obtained in the dorsal lithotomy position. Bladder is drained with a Robinson catheter. A speculum is placed and the anterior vaginal wall is grasped with Allis clamps where some cystocele is noted back to the vaginal apex. The patient has had prior hysterectomy. Midline is injected with 1% lidocaine with epinephrine and then a vertical incision is made with the scalpel and extended by Metzenbaum scissors with careful dissection of the endopelvic fascia away from the vaginal mucosa. The retropubic space is gently dissected in the direction that the trocars for the pubovaginal sling will be placed. Hemostasis is assured at this point in time.   The tension-free vaginal tape Exact device is utilized for this procedure. The materials are obtained and using a small trocar attached to the sling material i is placed through the left retropubic space and gently guided superiorly anteriorly around the inferior surface of the pubic ramus and then tensely pump the skin in the mons pubis upwards. A small  incision is made with the scalpel and the trocar is pulled partially through. The trocar is then placed on the right side of the sling and is placed through the right-sided retropubic space around the inferior surface of the pubic rami up to the anterior surface of the skin where an incision is made with removal partially of the trocar.   Cystoscopy is performed with saline distention of the bladder. Left side is normal, but on the right side there is an apparent perforation of the trocar through the bladder. The cystoscope is removed and fluid drained from the bladder completely. The trocar is removed and prepared to be replaced. A rigid catheter guide is placed into the catheter to deviate the bladder to the contralateral side. This was performed at the early part of the case as well as now. Repeat placement of the trocar is performed. Repeat cystoscopy reveals no perforation or penetration of the trocar through and through the bladder with appropriate placement revealed. There is no bleeding from the prior site and no extension of any lacerations or tears. Cystoscope removed with clear urine noted here and at the conclusion of the case. The cystoscope is removed and approximately 200 to 300 mL is left in the bladder at this time.   The tension-free vaginal tape is then pulled upwards until it is flushed with the anterior vaginal wall. There is an instrument placed between the tape and the anterior vaginal wall and the tape is tightened. Suprapubic pressure is performed until minimal to no leakage is noted on pressure. It is then gently loosened just a millimeter and then the  sleeves are removed from the tape itself and the tape is then left in place. It is cut off at the level of the skin. The skin is closed with Dermabond.   The imbrication sutures using 0 Vicryl pop-offs are then performed for anterior colporrhaphy purposes and to oversew the sling material. Excess vaginal mucosa is excised, and the  vaginal mucosa is closed with a 2-0 Vicryl suture in a running locking fashion. Vaginal cavity is irrigated with fluid. Foley catheter is inserted. A sponge with bacitracin is placed vaginally for overnight purposes. Urine is clear. The patient goes to the recovery room in stable condition. All sponge, instrument, and needle counts are correct.  ____________________________ R. Barnett Applebaum, MD rph:sb D: 10/03/2013 14:57:24 ET T: 10/03/2013 15:57:13 ET JOB#: [id number]  cc: Glean Salen, MD, <Dictator> Gae Dry MD ELECTRONICALLY SIGNED 10/03/2013 22:51

## 2015-02-10 NOTE — Discharge Summary (Signed)
PATIENT NAME:  Susan Houston, Susan Houston MR#:  [id number] DATE OF BIRTH:  09-07-1961  DATE OF ADMISSION:  11/26/2011 DATE OF DISCHARGE:  11/28/2011  ADMITTING DIAGNOSIS: Status post right knee polyethylene tibial component revision for unstable total knee.   DISCHARGE DIAGNOSIS: Status post right knee polyethylene tibial component revision for unstable total knee.   ATTENDING: Dr. Hessie Knows with [facility] orthopedics.   PROCEDURE: On 02/07 patient underwent right knee revision of polyethylene tibial component for instability by Dr. Rudene Christians, assistant Dorthula Matas, PA-C.   ANESTHESIA: Spinal.   ESTIMATED BLOOD LOSS: 100 mL.  SPECIMEN SENT: Fluid culture which was negative for any bacterial growth.   OPERATIVE FINDINGS: Laxity but otherwise benign appearance. Good stability was obtained after exchange to a thicker poly.   DRAINS: No drains were placed.   COMPLICATIONS: No complications occurred.   IMPLANTS: Stryker #3, 16 mm PS insert.   PATIENT HISTORY: Patient is a 54 year old who had a prior right total knee replacement. She had a synovectomy last year in August 2012 and did well for about four months. She started having increasing pain last month and went to urgent care. They did not obtain x-rays. She has been having increasing pain to the right knee and a sensation of the knee giving way.   ALLERGIES AND ADVERSE REACTIONS: Vicodin and Lexapro both cause insomnia.   PAST MEDICAL HISTORY:  1. Hypertension. 2. Asthma.  3. Chronic obstructive pulmonary disease. 4. Low back pain. 5. Arthritis. 6. Laryngeal cancer.   PHYSICAL EXAMINATION: HEART: Regular rate and rhythm. LUNGS: Occasional wheezing. RIGHT KNEE: She does have mid flexion instability and instability in flexion and extension to varus valgus stress. With 90 degrees flexion there is also some instability posterior drawer and Lachman.  LABORATORY, DIAGNOSTIC AND RADIOLOGICAL DATA: X-rays of the right knee were obtained  showing well aligned total knee with no evidence of loosening of the metallic implants.   HOSPITAL COURSE: The patient underwent aforementioned procedure by Dr. Rudene Christians on 02/58 without complication and was transferred to the postanesthesia care unit then the orthopedic floor in stable condition. The first morning when I saw her she was sleeping; she was resting comfortably. She was treated with aspirin and TED hose for deep vein thrombosis prophylaxis. She had tolerated her diet well while here. She underwent right knee dressing change on postoperative day two. There was no sign of infection about her wound site and her staples and incisions were intact. She was afebrile with stable vital signs. The patient was able to have a bowel movement before leaving. The patient worked with physical therapy several times while here and had been independent with transfers and ambulated 100 feet on the 9th.   CONDITION AT DISCHARGE: Stable.   DISPOSITION: Home with home health physical therapy.   DISCHARGE MEDICATIONS:  1. Oxycodone 5 to 10 mg every four hours as needed for pain. 2. Nucynta 75 mg 1 to 2 every six hours as needed for pain.   DISCHARGE INSTRUCTIONS AND FOLLOW UP:  1. Patient is weight-bearing as tolerated on her affected leg.  2. She will keep her knee dressing on and keep this area clean and dry.  3. She will call our office for any disturbing symptoms.  4. She will elevate her leg as much as possible.  5. Regular diet without any concentrated sweets or sugar.  6. She will take an aspirin 325 mg every day.  7. She will use Polar Care unit on her knee to decrease swelling  and pain.  8. She may increase her Celebrex 200 mg to twice a day if needed.  9. The patient is to call [facility] orthopedics for follow-up appointment.   ____________________________ Susan Houston, [place] jrp:cms D: 12/02/2011 17:23:34 ET T: 12/02/2011 17:38:58 ET JOB#: [id number]  cc: Susan Ivory. Charlett Nose, [place],  <Dictator>  [place] PA ELECTRONICALLY SIGNED 12/09/2011 8:04

## 2015-02-10 NOTE — Op Note (Signed)
PATIENT NAME:  Susan Houston, Susan Houston MR#:  [id number] DATE OF BIRTH:  1961-06-09  DATE OF PROCEDURE:  11/26/2011  PREOPERATIVE DIAGNOSIS: Unstable right total knee.   POSTOPERATIVE DIAGNOSIS:  Unstable right total knee.  PROCEDURE: Revision polyethylene component right total knee.   ANESTHESIA: Spinal.   SURGEON: Laurene Footman, MD  ASSISTANT: Dorthula Matas, PA-C   DESCRIPTION OF PROCEDURE: The patient was brought to the operating room and after adequate anesthesia was obtained, the right leg was prepped and draped in the usual sterile fashion with the Alvarado legholder being utilized. After patient identification and time-out procedures were completed, the prior skin incision was opened. No tourniquet ended up being needed. Following this, the old medial parapatellar arthrotomy was opened and the prior nonabsorbable sutures were removed. A deep culture was obtained. There did not appear to be any evidence of infection. The medial capsule was slightly elevated to get adequate exposure and some scar tissue anteriorly was excised to provide exposure of the proximal tibial component. The prior tibial insert was then removed and posterior scar tissue removed with use of a rongeur. After this had been completed, a 16-mm CR trial was placed and the knee was very stable without varus valgus instability. The final component was then opened and the 16-mm PS insert that was placed and snapped into position and extension. The knee was very stable through range of motion. The patella was checked and was also stable without any evidence of loosening. There was no periprosthetic problem along the cement/metal interface, no excessive synovitis. After thorough irrigation of the joint, the arthrotomy was repaired using a running quill suture, 2-0 quill subcutaneously, and staples. Xeroform, 4 x 4's, ABDs, Webril, and Ace wrap were applied. The patient was sent to recovery in stable condition.   ESTIMATED BLOOD LOSS:  100.   COMPLICATIONS: None.   SPECIMEN: Culture right knee.   ____________________________ Laurene Footman, MD mjm:bjt D: 11/26/2011 15:14:33 ET T: 11/26/2011 17:02:01 ET JOB#: [id number]  cc: Laurene Footman, MD, <Dictator> Laurene Footman MD ELECTRONICALLY SIGNED 11/27/2011 7:27

## 2015-02-12 ENCOUNTER — Ambulatory Visit
Admit: 2015-02-12 | Disposition: A | Discharge status: Home / Self Care | Payer: Self-pay | Attending: Pain Medicine | Admitting: Pain Medicine

## 2015-02-12 ENCOUNTER — Ambulatory Visit
Admit: 2015-02-12 | Disposition: A | Discharge status: Home / Self Care | Payer: Self-pay | Attending: Unknown Physician Specialty | Admitting: Unknown Physician Specialty

## 2015-02-12 DIAGNOSIS — M791 Myalgia: Secondary | ICD-10-CM | POA: Diagnosis not present

## 2015-02-12 DIAGNOSIS — Z5181 Encounter for therapeutic drug level monitoring: Secondary | ICD-10-CM | POA: Diagnosis not present

## 2015-02-12 DIAGNOSIS — M5416 Radiculopathy, lumbar region: Secondary | ICD-10-CM | POA: Diagnosis not present

## 2015-02-12 DIAGNOSIS — Z0389 Encounter for observation for other suspected diseases and conditions ruled out: Secondary | ICD-10-CM | POA: Diagnosis not present

## 2015-02-12 DIAGNOSIS — R05 Cough: Secondary | ICD-10-CM | POA: Diagnosis not present

## 2015-02-12 DIAGNOSIS — M47817 Spondylosis without myelopathy or radiculopathy, lumbosacral region: Secondary | ICD-10-CM | POA: Diagnosis not present

## 2015-02-12 DIAGNOSIS — F1120 Opioid dependence, uncomplicated: Secondary | ICD-10-CM | POA: Diagnosis not present

## 2015-02-12 DIAGNOSIS — Z79899 Other long term (current) drug therapy: Secondary | ICD-10-CM | POA: Diagnosis not present

## 2015-02-12 DIAGNOSIS — M542 Cervicalgia: Secondary | ICD-10-CM | POA: Diagnosis not present

## 2015-02-12 DIAGNOSIS — M461 Sacroiliitis, not elsewhere classified: Secondary | ICD-10-CM | POA: Diagnosis not present

## 2015-02-23 DIAGNOSIS — M533 Sacrococcygeal disorders, not elsewhere classified: Secondary | ICD-10-CM | POA: Insufficient documentation

## 2015-02-23 DIAGNOSIS — M51379 Other intervertebral disc degeneration, lumbosacral region without mention of lumbar back pain or lower extremity pain: Secondary | ICD-10-CM | POA: Insufficient documentation

## 2015-02-23 DIAGNOSIS — M5137 Other intervertebral disc degeneration, lumbosacral region: Secondary | ICD-10-CM | POA: Insufficient documentation

## 2015-02-23 NOTE — Patient Instructions (Addendum)
Continue present medications.  F/U Dr. Jeananne Rama / Julian Hy for eval BP and general condition.  F/U Dr Kasandra Knudsen for evaluation..  F/U surgical evaluation.  F/U neurological evaluation.  May consider radiofrequency rhizolysis, intraspinal implantation, and other procedures  Patient to call [facility] for any concerns prior to scheduled appointmentPain Management Discharge Instructions  General Discharge Instructions :  If you need to reach your doctor call: Monday-Friday 8:00 am - 4:00 pm at [phone] or toll free [phone].  After clinic hours [phone] to have operator reach doctor.  Bring all of your medication bottles to all your appointments in the pain clinic.  To cancel or reschedule your appointment with Pain Management please remember to call 24 hours in advance to avoid a fee.  Refer to the educational materials which you have been given on: General Risks, I had my Procedure. Discharge Instructions, Post Sedation.  Post Procedure Instructions:  The drugs you were given will stay in your system until tomorrow, so for the next 24 hours you should not drive, make any legal decisions or drink any alcoholic beverages.  You may eat anything you prefer, but it is better to start with liquids then soups and crackers, and gradually work up to solid foods.  Please notify your doctor immediately if you have any unusual bleeding, trouble breathing or pain that is not related to your normal pain.  Depending on the type of procedure that was done, some parts of your body may feel week and/or numb.  This usually clears up by tonight or the next day.  Walk with the use of an assistive device or accompanied by an adult for the 24 hours.  You may use ice on the affected area for the first 24 hours.  Put ice in a Ziploc bag and cover with a towel and place against area 15 minutes on 15 minutes off.  You may switch to heat after 24 hours.Pain Management Discharge  Instructions  General Discharge Instructions :  If you need to reach your doctor call: Monday-Friday 8:00 am - 4:00 pm at [phone] or toll free [phone].  After clinic hours [phone] to have operator reach doctor.  Bring all of your medication bottles to all your appointments in the pain clinic.  To cancel or reschedule your appointment with Pain Management please remember to call 24 hours in advance to avoid a fee.  Refer to the educational materials which you have been given on: General Risks, I had my Procedure. Discharge Instructions, Post Sedation.  Post Procedure Instructions:  The drugs you were given will stay in your system until tomorrow, so for the next 24 hours you should not drive, make any legal decisions or drink any alcoholic beverages.  You may eat anything you prefer, but it is better to start with liquids then soups and crackers, and gradually work up to solid foods.  Please notify your doctor immediately if you have any unusual bleeding, trouble breathing or pain that is not related to your normal pain.  Depending on the type of procedure that was done, some parts of your body may feel week and/or numb.  This usually clears up by tonight or the next day.  Walk with the use of an assistive device or accompanied by an adult for the 24 hours.  You may use ice on the affected area for the first 24 hours.  Put ice in a Ziploc bag and cover with a towel and place against area 15 minutes on 15 minutes  off.  You may switch to heat after 24 hours.

## 2015-02-25 ENCOUNTER — Encounter (INDEPENDENT_AMBULATORY_CARE_PROVIDER_SITE_OTHER): Payer: Self-pay

## 2015-02-25 ENCOUNTER — Ambulatory Visit: Payer: Medicare Other | Attending: Pain Medicine | Admitting: Pain Medicine

## 2015-02-25 ENCOUNTER — Encounter: Payer: Self-pay | Admitting: Pain Medicine

## 2015-02-25 VITALS — BP 110/56 | HR 56 | Temp 97.6°F | Resp 16 | Ht 62.0 in | Wt 150.0 lb

## 2015-02-25 DIAGNOSIS — M533 Sacrococcygeal disorders, not elsewhere classified: Secondary | ICD-10-CM | POA: Insufficient documentation

## 2015-02-25 DIAGNOSIS — M47816 Spondylosis without myelopathy or radiculopathy, lumbar region: Secondary | ICD-10-CM | POA: Insufficient documentation

## 2015-02-25 DIAGNOSIS — M5137 Other intervertebral disc degeneration, lumbosacral region: Secondary | ICD-10-CM | POA: Insufficient documentation

## 2015-02-25 DIAGNOSIS — M47817 Spondylosis without myelopathy or radiculopathy, lumbosacral region: Secondary | ICD-10-CM | POA: Diagnosis not present

## 2015-02-25 DIAGNOSIS — M545 Low back pain: Secondary | ICD-10-CM | POA: Diagnosis present

## 2015-02-25 MED ORDER — MIDAZOLAM HCL 5 MG/5ML IJ SOLN
INTRAMUSCULAR | Status: AC
  Administered 2015-02-25: 4 mg via INTRAVENOUS
  Filled 2015-02-25: qty 5

## 2015-02-25 MED ORDER — FENTANYL CITRATE (PF) 100 MCG/2ML IJ SOLN
INTRAMUSCULAR | Status: AC
  Administered 2015-02-25: 100 ug/mL via INTRAVENOUS
  Filled 2015-02-25: qty 2

## 2015-02-25 MED ORDER — ORPHENADRINE CITRATE 30 MG/ML IJ SOLN
INTRAMUSCULAR | Status: AC
  Administered 2015-02-25: 60 mg
  Filled 2015-02-25: qty 2

## 2015-02-25 MED ORDER — BUPIVACAINE HCL (PF) 0.25 % IJ SOLN
INTRAMUSCULAR | Status: AC
  Administered 2015-02-25: 11:00:00
  Filled 2015-02-25: qty 30

## 2015-02-25 MED ORDER — TRIAMCINOLONE ACETONIDE 40 MG/ML IJ SUSP
INTRAMUSCULAR | Status: AC
  Administered 2015-02-25: 10 mg
  Filled 2015-02-25: qty 1

## 2015-02-25 NOTE — Progress Notes (Signed)
LBP LE PAIN  PAIN ACROSS HIPS AND BUTTOCKS

## 2015-02-25 NOTE — Procedures (Signed)
PROCEDURE:  Block of nerves to the sacroiliac joint.   NOTE:  The patient is a 54 y.o. year-old female who returns to the [facility] for further evaluation and treatment of pain involving the lower back and lower extremity region with pain in the region of the buttocks as well. The patient is with prior studies revealing the patient to be with MRIs revealed patient to be with degenerative changes of the lumbar spine L4-5 disc desiccation, circumferential bulging and focal protrusion in the left neural foramen to extraforaminal region with facet and ligamentous hypertrophy, L5-S1 mild facet degeneration with no disc abnormalities there is concern regarding equal iliac joint dysfunction as well as intraspinal abnormalities contributing to patient's symptomatology we'll proceed with block of nerves to the sacroiliac joint thought to be medically necessary procedure in attempt to decrease severity of patient's symptoms and hopefully avoid progression of patient's condition and of her medication escalation .  The risks, benefits, expectations of the procedure have been discussed and explained to the patient who is understanding and willing to proceed with interventional treatment in attempt to decrease severity of patient's symptoms, minimize the risk of medication escalation and  hopefully retard the progression of the patient's symptoms. We will proceed with what is felt to be a medically necessary procedure, block of nerves to the sacroiliac joint.   DESCRIPTION OF PROCEDURE:  Block of nerves to the sacroiliac joint.   The patient was taken to the fluoroscopy suite. With the patient in the prone position with EKG, blood pressure, pulse and pulse oximetry monitoring, IV Versed, IV fentanyl conscious sedation, Betadine prep of proposed entry site was performed.   Block of nerves at the L5 vertebral body level.   With the patient in prone position, under fluoroscopic guidance, a 25-gauge needle was  inserted at the L5 vertebral body level on the left side. With 15 degrees oblique orientation a 25-gauge needle was inserted in the region known as Burton's eye or eye of the Scotty dog. Following documentation of needle placement in the area of Burton's eye or eye of the Scotty dog under fluoroscopic guidance, needle placement was then accomplished at the sacral ala level on the left side.   Needle placement at the sacral ala.   With the patient in prone position under fluoroscopic guidance with AP view of the lumbosacral spine, a 25-gauge needle was inserted in the region known as the sacral ala on the left side. Following documentation of needle placement on the left side under fluoroscopic guidance needle placement was then accomplished at the S1 foramen level.   Needle placement at the S1 foramen level.   With the patient in prone position under fluoroscopic guidance with AP view of the lumbosacral spine and cephalad orientation, a 25-gauge needle was inserted at the superior and lateral border of the S1 foramen on the left side. Following documentation of needle placement at the S1 foramen level on the left side, needle placement was then accomplished at the S2 foramen level on the left side.   Needle placement at the S2 foramen level.   With the patient in prone position with AP view of the lumbosacral spine with cephalad orientation, a 25-gauge needle was inserted at the superior and lateral border of the S2 foramen under fluoroscopic guidance on the left side. Following needle placement at the L5 vertebral body level, sacral ala, S1 foramen and S2 foramen on the left side, needle placement was verified on lateral view under fluoroscopic guidance.  Following needle placement documentation on lateral view, each needle was injected with 1 mL of 0.25% bupivacaine and Kenalog. A total of 10 mg of Kenalog was utilized for the procedure.    The procedure block of nerves to the sacroiliac joint was  performed on the right side as performed on the left side. Patient underwent block of nerves to the sacroiliac joint on the left as well as on the right at the L5 vertebral body level, sacral ala, S1 foramen, and the S2 foramen.  Now I would like to repeat this procedure on the right side, so you would then type this again with the right side being the side which you would transcribe. This procedure is usually done on the left side and the  right side. So, I would like for you to type the right side just as I dictated the left side.   PLAN:  1. Medications: The patient will continue presently prescribed medications.  2. The patient will be considered for modification of treatment regimen pending response to the procedure performed on today's visit.  3. The patient is to follow-up with primary care physician _Dr Crissman____ for evaluation of blood pressure and general medical condition following the procedure performed on today's visit.  4. Surgical evaluation as discussed.  5. Neurological evaluation as discussed.  6. The patient may be a candidate for radiofrequency procedures, implantation devices and other treatment pending response to treatment performed on today's visit and follow-up evaluation.  7. The patient has been advised to adhere to proper body mechanics and to avoid activities which may exacerbate the patient's symptoms.    [facility] appointment to [facility] as scheduled.

## 2015-02-25 NOTE — Progress Notes (Signed)
Discharge patient home via wheelchair at 1116hrs/ accompanied Cordella Starleen Blue. Teach back 3 done.

## 2015-02-26 ENCOUNTER — Telehealth: Payer: Self-pay | Admitting: *Deleted

## 2015-02-26 NOTE — Telephone Encounter (Signed)
Post procedure call

## 2015-03-05 ENCOUNTER — Encounter: Payer: Self-pay | Admitting: Pain Medicine

## 2015-03-05 DIAGNOSIS — M5136 Other intervertebral disc degeneration, lumbar region: Secondary | ICD-10-CM

## 2015-03-05 DIAGNOSIS — M51369 Other intervertebral disc degeneration, lumbar region without mention of lumbar back pain or lower extremity pain: Secondary | ICD-10-CM | POA: Insufficient documentation

## 2015-03-24 ENCOUNTER — Other Ambulatory Visit: Payer: Self-pay | Admitting: Pain Medicine

## 2015-03-24 DIAGNOSIS — M5136 Other intervertebral disc degeneration, lumbar region: Secondary | ICD-10-CM

## 2015-03-24 DIAGNOSIS — G5780 Other specified mononeuropathies of unspecified lower limb: Secondary | ICD-10-CM

## 2015-03-24 DIAGNOSIS — M533 Sacrococcygeal disorders, not elsewhere classified: Secondary | ICD-10-CM

## 2015-03-24 DIAGNOSIS — M5137 Other intervertebral disc degeneration, lumbosacral region: Secondary | ICD-10-CM

## 2015-03-28 ENCOUNTER — Encounter: Payer: Self-pay | Admitting: Pain Medicine

## 2015-03-28 ENCOUNTER — Encounter (INDEPENDENT_AMBULATORY_CARE_PROVIDER_SITE_OTHER): Payer: Self-pay

## 2015-03-28 ENCOUNTER — Ambulatory Visit: Payer: Medicare Other | Attending: Pain Medicine | Admitting: Pain Medicine

## 2015-03-28 VITALS — BP 104/57 | HR 59 | Temp 97.5°F | Resp 16 | Ht 62.0 in | Wt 152.0 lb

## 2015-03-28 DIAGNOSIS — M791 Myalgia: Secondary | ICD-10-CM | POA: Diagnosis not present

## 2015-03-28 DIAGNOSIS — M47816 Spondylosis without myelopathy or radiculopathy, lumbar region: Secondary | ICD-10-CM | POA: Diagnosis not present

## 2015-03-28 DIAGNOSIS — M5126 Other intervertebral disc displacement, lumbar region: Secondary | ICD-10-CM | POA: Insufficient documentation

## 2015-03-28 DIAGNOSIS — M5137 Other intervertebral disc degeneration, lumbosacral region: Secondary | ICD-10-CM

## 2015-03-28 DIAGNOSIS — M79604 Pain in right leg: Secondary | ICD-10-CM | POA: Diagnosis present

## 2015-03-28 DIAGNOSIS — M533 Sacrococcygeal disorders, not elsewhere classified: Secondary | ICD-10-CM | POA: Diagnosis not present

## 2015-03-28 DIAGNOSIS — M545 Low back pain: Secondary | ICD-10-CM | POA: Diagnosis present

## 2015-03-28 DIAGNOSIS — M79605 Pain in left leg: Secondary | ICD-10-CM | POA: Diagnosis present

## 2015-03-28 DIAGNOSIS — M47817 Spondylosis without myelopathy or radiculopathy, lumbosacral region: Secondary | ICD-10-CM | POA: Diagnosis not present

## 2015-03-28 DIAGNOSIS — M461 Sacroiliitis, not elsewhere classified: Secondary | ICD-10-CM | POA: Diagnosis not present

## 2015-03-28 DIAGNOSIS — M5136 Other intervertebral disc degeneration, lumbar region: Secondary | ICD-10-CM | POA: Insufficient documentation

## 2015-03-28 DIAGNOSIS — M5416 Radiculopathy, lumbar region: Secondary | ICD-10-CM | POA: Diagnosis not present

## 2015-03-28 HISTORY — DX: Spondylosis without myelopathy or radiculopathy, lumbar region: M47.816

## 2015-03-28 NOTE — Progress Notes (Signed)
   Subjective:    Patient ID: Susan Houston, female    DOB: [birth year], 54 y.o.   MRN: [id number]  HPI  Patient is 54 year old female returns to [place] for further evaluation and treatment of pain involving the lower back and lower extremity regions. Patient states that her pain is aggravated by standing walking twisting turning maneuvers and that she has difficulty climbing stairs. A recent trauma change in events of daily living to call significant change in symptomatology. Patient states that the pain is also aggravated by turning over in bed. We've discussed patient's condition and patient appears to be with significant component of pain due to sacroiliac joint dysfunction. We will proceed with block of nerves to the sacroiliac joint at time of return appointment in attempt to decrease severity of patient's symptoms minimize progression of patient's symptoms and hopefully avoid the need for more involved treatment. The patient was understanding and agreement with suggested treatment plan.    Review of Systems     Objective:   Physical Exam There was tenderness of the splenius capitis and occipitalis musculature regions of mild degree. Palpation of the acromioclavicular glenohumeral joint regions reproduced mild discomfort. There was unremarkable Spurling's maneuver and patient appeared to be with bilaterally equal grip strength with Tinel and Phalen's maneuver reproducing minimal discomfort. Patient over the cervical and thoracic paraspinal musculature region was a tends to palpation of moderate degree. Palpation over the lumbar paraspinal musculature region lumbar facet region associated with moderate moderately severe discomfort. Lateral bending and rotation and extension and palpation of the lumbar facets reproduce moderate to moderately severe discomfort. There was severe tenderness to palpation of the PSIS and PII S regions on the left as well as on the right. There was mild  tends to palpation of the greater trochanteric region and iliotibial band region. Straight leg raising was tolerates approximately 30 without an increase of pain with dorsiflexion noted. There was negative clonus negative Homans. Abdomen was not soft and no costovertebral angle tenderness was noted.       Assessment & Plan:   Degenerative disc disease of the lumbar spine Degenerative changes L4-L5 disc desiccation, circumferential bulging and focal protrusion at on the left in the region of the left neural foramen to extraforaminal region with facet and ligamentous hypertrophy, L5-S1 mild facet degeneration with no disc abnormalities  Sacroiliac joint dysfunction, sacroiliac joint disease  Lumbar facet syndrome    Plan  Continue present medications. We will need documentation from Dr. Kasandra Knudsen stating that you are an appropriate candidate to receive opioid medications prescribed at the pain management clinic  Block of nerves to the sacroiliac joint to be performed at time of return appointment  F/U PCP for evaliation of  BP and general medical  condition.  F/U Dr. Kasandra Knudsen as planned  F/U surgical evaluation.  F/U neurological evaluation.  May consider radiofrequency rhizolysis or intraspinal procedures pending response to present treatment and F/U evaluation.  Patient to call [facility] should patient have concerns prior to scheduled return appointment.

## 2015-03-28 NOTE — Progress Notes (Signed)
Safety precautions to be maintained throughout the outpatient stay will include: orient to surroundings, keep bed in low position, maintain call bell within reach at all times, provide assistance with transfer out of bed and ambulation.  

## 2015-03-28 NOTE — Patient Instructions (Addendum)
Smoking Cessation Quitting smoking is important to your health and has many advantages. However, it is not always easy to quit since nicotine is a very addictive drug. Oftentimes, people try 3 times or more before being able to quit. This document explains the best ways for you to prepare to quit smoking. Quitting takes hard work and a lot of effort, but you can do it. ADVANTAGES OF QUITTING SMOKING 1. You will live longer, feel better, and live better. 2. Your body will feel the impact of quitting smoking almost immediately. 1. Within 20 minutes, blood pressure decreases. Your pulse returns to its normal level. 2. After 8 hours, carbon monoxide levels in the blood return to normal. Your oxygen level increases. 3. After 24 hours, the chance of having a heart attack starts to decrease. Your breath, hair, and body stop smelling like smoke. 4. After 48 hours, damaged nerve endings begin to recover. Your sense of taste and smell improve. 5. After 72 hours, the body is virtually free of nicotine. Your bronchial tubes relax and breathing becomes easier. 6. After 2 to 12 weeks, lungs can hold more air. Exercise becomes easier and circulation improves. 3. The risk of having a heart attack, stroke, cancer, or lung disease is greatly reduced. 1. After 1 year, the risk of coronary heart disease is cut in half. 2. After 5 years, the risk of stroke falls to the same as a nonsmoker. 3. After 10 years, the risk of lung cancer is cut in half and the risk of other cancers decreases significantly. 4. After 15 years, the risk of coronary heart disease drops, usually to the level of a nonsmoker. 4. If you are pregnant, quitting smoking will improve your chances of having a healthy baby. 5. The people you live with, especially any children, will be healthier. 6. You will have extra money to spend on things other than cigarettes. QUESTIONS TO THINK ABOUT BEFORE ATTEMPTING TO QUIT You may want to talk about your  answers with your health care provider.  Why do you want to quit?  If you tried to quit in the past, what helped and what did not?  What will be the most difficult situations for you after you quit? How will you plan to handle them?  Who can help you through the tough times? Your family? Friends? A health care provider?  What pleasures do you get from smoking? What ways can you still get pleasure if you quit? Here are some questions to ask your health care provider: 1. How can you help me to be successful at quitting? 2. What medicine do you think would be best for me and how should I take it? 3. What should I do if I need more help? 4. What is smoking withdrawal like? How can I get information on withdrawal? GET READY  Set a quit date.  Change your environment by getting rid of all cigarettes, ashtrays, matches, and lighters in your home, car, or work. Do not let people smoke in your home.  Review your past attempts to quit. Think about what worked and what did not. GET SUPPORT AND ENCOURAGEMENT You have a better chance of being successful if you have help. You can get support in many ways. 1. Tell your family, friends, and coworkers that you are going to quit and need their support. Ask them not to smoke around you. 2. Get individual, group, or telephone counseling and support. Programs are available at [employer] and health centers. Call  your local health department for information about programs in your area. 3. Spiritual beliefs and practices may help some smokers quit. 4. Download a "quit meter" on your computer to keep track of quit statistics, such as how long you have gone without smoking, cigarettes not smoked, and money saved. 5. Get a self-help book about quitting smoking and staying off tobacco. [place] 1. Distract yourself from urges to smoke. Talk to someone, go for a walk, or occupy your time with a task. 2. Change your normal routine. Take  a different route to work. Drink tea instead of coffee. Eat breakfast in a different place. 3. Reduce your stress. Take a hot bath, exercise, or read a book. 4. Plan something enjoyable to do every day. Reward yourself for not smoking. 5. Explore interactive web-based programs that specialize in helping you quit. GET MEDICINE AND USE IT CORRECTLY Medicines can help you stop smoking and decrease the urge to smoke. Combining medicine with the above behavioral methods and support can greatly increase your chances of successfully quitting smoking.  Nicotine replacement therapy helps deliver nicotine to your body without the negative effects and risks of smoking. Nicotine replacement therapy includes nicotine gum, lozenges, inhalers, nasal sprays, and skin patches. Some may be available over-the-counter and others require a prescription.  Antidepressant medicine helps people abstain from smoking, but how this works is unknown. This medicine is available by prescription.  Nicotinic receptor partial agonist medicine simulates the effect of nicotine in your brain. This medicine is available by prescription. Ask your health care provider for advice about which medicines to use and how to use them based on your health history. Your health care provider will tell you what side effects to look out for if you choose to be on a medicine or therapy. Carefully read the information on the package. Do not use any other product containing nicotine while using a nicotine replacement product.  RELAPSE OR DIFFICULT SITUATIONS Most relapses occur within the first 3 months after quitting. Do not be discouraged if you start smoking again. Remember, most people try several times before finally quitting. You may have symptoms of withdrawal because your body is used to nicotine. You may crave cigarettes, be irritable, feel very hungry, cough often, get headaches, or have difficulty concentrating. The withdrawal symptoms are only  temporary. They are strongest when you first quit, but they will go away within 10-14 days. To reduce the chances of relapse, try to:  Avoid drinking alcohol. Drinking lowers your chances of successfully quitting.  Reduce the amount of caffeine you consume. Once you quit smoking, the amount of caffeine in your body increases and can give you symptoms, such as a rapid heartbeat, sweating, and anxiety.  Avoid smokers because they can make you want to smoke.  Do not let weight gain distract you. Many smokers will gain weight when they quit, usually less than 10 pounds. Eat a healthy diet and stay active. You can always lose the weight gained after you quit.  Find ways to improve your mood other than smoking. FOR MORE INFORMATION  www.smokefree.gov  Document Released: 09/29/2001 Document Revised: 02/19/2014 Document Reviewed: 01/14/2012 [facility] Patient Information 2015 [place], [place]. This information is not intended to replace advice given to you by your health care provider. Make sure you discuss any questions you have with your health care provider.   Continue present medications. As discussed we will need documentation from Dr. Kasandra Knudsen prior to prescribing medications   Block of  nerves to the sacroiliac joint 04/10/2015  F/U PCP for evaliation of  BP and general medical  condition.  F/U surgical evaluation.  F/U neurological evaluation.  May consider radiofrequency rhizolysis or intraspinal procedures pending response to present treatment and F/U evaluation.  Patient to call [facility] should patient have concerns prior to scheduled return appointment.   Facet Blocks Patient Information  Description: The facets are joints in the spine between the vertebrae.  Like any joints in the body, facets can become irritated and painful.  Arthritis can also effect the facets.  By injecting steroids and local anesthetic in and around these joints, we can temporarily block the nerve  supply to them.  Steroids act directly on irritated nerves and tissues to reduce selling and inflammation which often leads to decreased pain.  Facet blocks may be done anywhere along the spine from the neck to the low back depending upon the location of your pain.   After numbing the skin with local anesthetic (like Novocaine), a small needle is passed onto the facet joints under x-ray guidance.  You may experience a sensation of pressure while this is being done.  The entire block usually lasts about 15-25 minutes.   Conditions which may be treated by facet blocks:   Low back/buttock pain  Neck/shoulder pain  Certain types of headaches  Preparation for the injection:  5. Do not eat any solid food or dairy products within 6 hours of your appointment. 6. You may drink clear liquid up to 2 hours before appointment.  Clear liquids include water, black coffee, juice or soda.  No milk or cream please. 7. You may take your regular medication, including pain medications, with a sip of water before your appointment.  Diabetics should hold regular insulin (if taken separately) and take 1/2 normal NPH dose the morning of the procedure.  Carry some sugar containing items with you to your appointment. 8. A driver must accompany you and be prepared to drive you home after your procedure. 9. Bring all your current medications with you. 10. An IV may be inserted and sedation may be given at the discretion of the physician. 11. A blood pressure cuff, EKG and other monitors will often be applied during the procedure.  Some patients may need to have extra oxygen administered for a short period. 12. You will be asked to provide medical information, including your allergies and medications, prior to the procedure.  We must know immediately if you are taking blood thinners (like Coumadin/Warfarin) or if you are allergic to IV iodine contrast (dye).  We must know if you could possible be pregnant.  Possible  side-effects:   Bleeding from needle site  Infection (rare, may require surgery)  Nerve injury (rare)  Numbness & tingling (temporary)  Difficulty urinating (rare, temporary)  Spinal headache (a headache worse with upright posture)  Light-headedness (temporary)  Pain at injection site (serveral days)  Decreased blood pressure (rare, temporary)  Weakness in arm/leg (temporary)  Pressure sensation in back/neck (temporary)   Call if you experience:   Fever/chills associated with headache or increased back/neck pain  Headache worsened by an upright position  New onset, weakness or numbness of an extremity below the injection site  Hives or difficulty breathing (go to the emergency room)  Inflammation or drainage at the injection site(s)  Severe back/neck pain greater than usual  New symptoms which are concerning to you  Please note:  Although the local anesthetic injected can often make your  back or neck feel good for several hours after the injection, the pain will likely return. It takes 3-7 days for steroids to work.  You may not notice any pain relief for at least one week.  If effective, we will often do a series of 2-3 injections spaced 3-6 weeks apart to maximally decrease your pain.  After the initial series, you may be a candidate for a more permanent nerve block of the facets.  If you have any questions, please call #[phone]) [facility]

## 2015-04-10 ENCOUNTER — Ambulatory Visit: Payer: Medicare Other | Attending: Pain Medicine | Admitting: Pain Medicine

## 2015-04-10 VITALS — BP 100/47 | HR 52 | Temp 97.7°F | Resp 12 | Ht 62.0 in | Wt 157.0 lb

## 2015-04-10 DIAGNOSIS — M545 Low back pain: Secondary | ICD-10-CM | POA: Diagnosis present

## 2015-04-10 DIAGNOSIS — M79604 Pain in right leg: Secondary | ICD-10-CM | POA: Diagnosis present

## 2015-04-10 DIAGNOSIS — M5136 Other intervertebral disc degeneration, lumbar region: Secondary | ICD-10-CM | POA: Diagnosis not present

## 2015-04-10 DIAGNOSIS — M5126 Other intervertebral disc displacement, lumbar region: Secondary | ICD-10-CM | POA: Diagnosis not present

## 2015-04-10 DIAGNOSIS — M5137 Other intervertebral disc degeneration, lumbosacral region: Secondary | ICD-10-CM

## 2015-04-10 DIAGNOSIS — M533 Sacrococcygeal disorders, not elsewhere classified: Secondary | ICD-10-CM

## 2015-04-10 DIAGNOSIS — M79605 Pain in left leg: Secondary | ICD-10-CM | POA: Diagnosis present

## 2015-04-10 DIAGNOSIS — M47816 Spondylosis without myelopathy or radiculopathy, lumbar region: Secondary | ICD-10-CM

## 2015-04-10 DIAGNOSIS — M47817 Spondylosis without myelopathy or radiculopathy, lumbosacral region: Secondary | ICD-10-CM | POA: Diagnosis not present

## 2015-04-10 MED ORDER — ORPHENADRINE CITRATE 30 MG/ML IJ SOLN
INTRAMUSCULAR | Status: AC
  Filled 2015-04-10: qty 2

## 2015-04-10 MED ORDER — BUPIVACAINE HCL (PF) 0.25 % IJ SOLN
INTRAMUSCULAR | Status: AC
  Administered 2015-04-10: 09:00:00
  Filled 2015-04-10: qty 30

## 2015-04-10 MED ORDER — MIDAZOLAM HCL 5 MG/5ML IJ SOLN
INTRAMUSCULAR | Status: AC
  Administered 2015-04-10: 5 mg via INTRAVENOUS
  Filled 2015-04-10: qty 5

## 2015-04-10 MED ORDER — TRIAMCINOLONE ACETONIDE 40 MG/ML IJ SUSP
INTRAMUSCULAR | Status: AC
  Administered 2015-04-10: 09:00:00
  Filled 2015-04-10: qty 1

## 2015-04-10 MED ORDER — FENTANYL CITRATE (PF) 100 MCG/2ML IJ SOLN
INTRAMUSCULAR | Status: AC
  Administered 2015-04-10: 100 ug via INTRAVENOUS
  Filled 2015-04-10: qty 2

## 2015-04-10 NOTE — Progress Notes (Signed)
Safety precautions to be maintained throughout the outpatient stay will include: orient to surroundings, keep bed in low position, maintain call bell within reach at all times, provide assistance with transfer out of bed and ambulation.  Tolerating po fluids well. No c/o nausea.

## 2015-04-10 NOTE — Patient Instructions (Addendum)
Continue present medications.  F/U PCP for evaliation of  BP and general medical  condition.  F/U surgical evaluation.  F/U neurological evaluation.  F/U Dr Kasandra Knudsen as scheduled  May consider radiofrequency rhizolysis or intraspinal procedures pending response to present treatment and F/U evaluation.  Patient to call [facility] Management Center should patient have concerns prior to scheduled return appointment.   Pain Management Discharge Instructions  General Discharge Instructions :  If you need to reach your doctor call: Monday-Friday 8:00 am - 4:00 pm at [phone] or toll free [phone].  After clinic hours [phone] to have operator reach doctor.  Bring all of your medication bottles to all your appointments in the pain clinic.  To cancel or reschedule your appointment with Pain Management please remember to call 24 hours in advance to avoid a fee.  Refer to the educational materials which you have been given on: General Risks, I had my Procedure. Discharge Instructions, Post Sedation.  Post Procedure Instructions:  The drugs you were given will stay in your system until tomorrow, so for the next 24 hours you should not drive, make any legal decisions or drink any alcoholic beverages.  You may eat anything you prefer, but it is better to start with liquids then soups and crackers, and gradually work up to solid foods.  Please notify your doctor immediately if you have any unusual bleeding, trouble breathing or pain that is not related to your normal pain.  Depending on the type of procedure that was done, some parts of your body may feel week and/or numb.  This usually clears up by tonight or the next day.  Walk with the use of an assistive device or accompanied by an adult for the 24 hours.  You may use ice on the affected area for the first 24 hours.  Put ice in a Ziploc bag and cover with a towel and place against area 15 minutes on 15 minutes off.  You may switch to  heat after 24 hours.

## 2015-04-10 NOTE — Progress Notes (Signed)
Subjective:    Patient ID: Susan Houston, female    DOB: 1960/10/30, 54 y.o.   MRN: [id number]  HPI  PROCEDURE:  Block of nerves to the sacroiliac joint.   NOTE:  The patient is a 54 y.o. female who returns to the [facility] for further evaluation and treatment of pain involving the lower back and lower extremity region with pain in the region of the buttocks as well. Prior MRI studies reveal degenerative changes lumbar spine with L4-L5 disc desiccation, circumferential bulging and focal protrusion in the left neural foramen to extraforaminal region with facet and ligamentous hypertrophy, L5-S1 mild facet degeneration with no disc abnormalities. Patient is with positive Patrick's maneuver and is with significant increase of pain with palpation over the PSIS and PII S regions. There is concern regarding significant component of patient's pain being due to sacroiliac joint dysfunction. .  The risks, benefits, expectations of the procedure have been discussed and explained to the patient who is understanding and willing to proceed with interventional treatment in attempt to decrease severity of patient's symptoms, minimize the risk of medication escalation and  hopefully retard the progression of the patient's symptoms. We will proceed with what is felt to be a medically necessary procedure, block of nerves to the sacroiliac joint.   DESCRIPTION OF PROCEDURE:  Block of nerves to the sacroiliac joint.   The patient was taken to the fluoroscopy suite. With the patient in the prone position with EKG, blood pressure, pulse and pulse oximetry monitoring, IV Versed, IV fentanyl conscious sedation, Betadine prep of proposed entry site was performed.   Block of nerves at the L5 vertebral body level.   With the patient in prone position, under fluoroscopic guidance, a 22 -gauge needle was inserted at the L5 vertebral body level on the left side. With 15 degrees oblique orientation a 22 -gauge needle  was inserted in the region known as Burton's eye or eye of the Scotty dog. Following documentation of needle placement in the area of Burton's eye or eye of the Scotty dog under fluoroscopic guidance, needle placement was then accomplished at the sacral ala level on the left left side.   Needle placement at the sacral ala.   With the patient in prone position under fluoroscopic guidance with AP view of the lumbosacral spine, a 22 -gauge needle was inserted in the region known as the sacral ala on the left side. Following documentation of needle placement on the left left side under fluoroscopic guidance needle placement was then accomplished at the S1 foramen level.   Needle placement at the S1 foramen level.   With the patient in prone position under fluoroscopic guidance with AP view of the lumbosacral spine and cephalad orientation, a 22 -gauge needle was inserted at the superior and lateral border of the S1 foramen on the left side. Following documentation of needle placement at the S1 foramen level on the left side, needle placement was then accomplished at the S2 foramen level on the left side.   Needle placement at the S2 foramen level.   With the patient in prone position with AP view of the lumbosacral spine with cephalad orientation, a 22 - gauge needle was inserted at the superior and lateral border of the S2 foramen under fluoroscopic guidance on the left side. Following needle placement at the L5 vertebral body level, sacral ala, S1 foramen and S2 foramen on the left side, needle placement was verified on lateral view under fluoroscopic guidance.  Following needle placement documentation on lateral view, each needle was injected with 1 mL of 0.25% bupivacaine and Kenalog.   BLOCK OF THE NERVES TO SACROILIAC JOINT ON THE RIGHT SIDE The procedure was performed on the right side at the same levels as was performed on the left side and utilizing the same technique as on the left side and was  performed under fluoroscopic guidance as on the left side   A total of '10mg'$  of Kenalog was utilized for the procedure.   PLAN:  1. Medications: The patient will continue presently prescribed medications.  2. The patient will be considered for modification of treatment regimen pending response to the procedure performed on today's visit.  3. The patient is to follow-up with primary care physician for evaluation of blood pressure and general medical condition following the procedure performed on today's visit.  4. Surgical evaluation as discussed.  5. Neurological evaluation as discussed.  6. The patient may be a candidate for radiofrequency procedures, implantation devices and other treatment pending response to treatment performed on today's visit and follow-up evaluation.  7. The patient has been advised to adhere to proper body mechanics and to avoid activities which may exacerbate the patient's symptoms.  8.    Follow-up Dr. Kasandra Knudsen as scheduled  Return appointment to [facility] as scheduled.    Review of Systems     Objective:   Physical Exam        Assessment & Plan:

## 2015-04-11 ENCOUNTER — Telehealth: Payer: Self-pay | Admitting: *Deleted

## 2015-04-11 NOTE — Telephone Encounter (Signed)
Message left

## 2015-05-09 ENCOUNTER — Encounter: Payer: Self-pay | Admitting: Pain Medicine

## 2015-05-09 ENCOUNTER — Ambulatory Visit: Payer: Medicare Other | Attending: Pain Medicine | Admitting: Pain Medicine

## 2015-05-09 VITALS — BP 105/68 | HR 66 | Temp 98.6°F | Resp 16 | Ht 62.0 in | Wt 178.0 lb

## 2015-05-09 DIAGNOSIS — M79605 Pain in left leg: Secondary | ICD-10-CM | POA: Diagnosis present

## 2015-05-09 DIAGNOSIS — M545 Low back pain: Secondary | ICD-10-CM | POA: Diagnosis present

## 2015-05-09 DIAGNOSIS — M47816 Spondylosis without myelopathy or radiculopathy, lumbar region: Secondary | ICD-10-CM | POA: Diagnosis not present

## 2015-05-09 DIAGNOSIS — Z5181 Encounter for therapeutic drug level monitoring: Secondary | ICD-10-CM | POA: Diagnosis not present

## 2015-05-09 DIAGNOSIS — Z0389 Encounter for observation for other suspected diseases and conditions ruled out: Secondary | ICD-10-CM | POA: Diagnosis not present

## 2015-05-09 DIAGNOSIS — M5126 Other intervertebral disc displacement, lumbar region: Secondary | ICD-10-CM | POA: Diagnosis not present

## 2015-05-09 DIAGNOSIS — M5136 Other intervertebral disc degeneration, lumbar region: Secondary | ICD-10-CM | POA: Diagnosis not present

## 2015-05-09 DIAGNOSIS — M79609 Pain in unspecified limb: Secondary | ICD-10-CM | POA: Diagnosis not present

## 2015-05-09 DIAGNOSIS — M533 Sacrococcygeal disorders, not elsewhere classified: Secondary | ICD-10-CM | POA: Diagnosis not present

## 2015-05-09 DIAGNOSIS — M47817 Spondylosis without myelopathy or radiculopathy, lumbosacral region: Secondary | ICD-10-CM | POA: Diagnosis not present

## 2015-05-09 DIAGNOSIS — F1120 Opioid dependence, uncomplicated: Secondary | ICD-10-CM | POA: Diagnosis not present

## 2015-05-09 DIAGNOSIS — M791 Myalgia: Secondary | ICD-10-CM | POA: Diagnosis not present

## 2015-05-09 DIAGNOSIS — Z79899 Other long term (current) drug therapy: Secondary | ICD-10-CM | POA: Diagnosis not present

## 2015-05-09 DIAGNOSIS — M5416 Radiculopathy, lumbar region: Secondary | ICD-10-CM | POA: Diagnosis not present

## 2015-05-09 DIAGNOSIS — M5137 Other intervertebral disc degeneration, lumbosacral region: Secondary | ICD-10-CM

## 2015-05-09 DIAGNOSIS — M79604 Pain in right leg: Secondary | ICD-10-CM | POA: Diagnosis present

## 2015-05-09 MED ORDER — TAPENTADOL HCL 50 MG PO TABS: ORAL_TABLET | ORAL | Status: DC

## 2015-05-09 NOTE — Progress Notes (Signed)
Safety precautions to be maintained throughout the outpatient stay will include: orient to surroundings, keep bed in low position, maintain call bell within reach at all times, provide assistance with transfer out of bed and ambulation.  

## 2015-05-09 NOTE — Patient Instructions (Addendum)
Continue present medications. Today we will begin Nucynta you are to avoid taking oxycodone, hydrocodone, or similar medications. Nucynta is a strong opioid medication which can cause drowsiness confusion and other undesirable side effects. Stay in the presence of an adult driver when beginning to take this medication and call 911 or go  to the emergency room should you have any undesirable side effects   Lumbar facet, medial branch nerve, blocks to be performed Monday, 05/20/2015  F/U PCP Dr. Jeananne Rama for evaliation of  BP and general medical  condition.  F/U surgical evaluation  F/U neurological evaluation  F/U psych evaluation with Dr.Su   May consider radiofrequency rhizolysis or intraspinal procedures pending response to present treatment and F/U evaluation.  Patient to call [facility] should patient have concerns prior to scheduled return appointment. GENERAL RISKS AND COMPLICATIONS  What are the risk, side effects and possible complications? Generally speaking, most procedures are safe.  However, with any procedure there are risks, side effects, and the possibility of complications.  The risks and complications are dependent upon the sites that are lesioned, or the type of nerve block to be performed.  The closer the procedure is to the spine, the more serious the risks are.  Great care is taken when placing the radio frequency needles, block needles or lesioning probes, but sometimes complications can occur. 1. Infection: Any time there is an injection through the skin, there is a risk of infection.  This is why sterile conditions are used for these blocks.  There are four possible types of infection. 1. Localized skin infection. 2. Central Nervous System Infection-This can be in the form of Meningitis, which can be deadly. 3. Epidural Infections-This can be in the form of an epidural abscess, which can cause pressure inside of the spine, causing compression of the spinal  cord with subsequent paralysis. This would require an emergency surgery to decompress, and there are no guarantees that the patient would recover from the paralysis. 4. Discitis-This is an infection of the intervertebral discs.  It occurs in about 1% of discography procedures.  It is difficult to treat and it may lead to surgery.        2. Pain: the needles have to go through skin and soft tissues, will cause soreness.       3. Damage to internal structures:  The nerves to be lesioned may be near blood vessels or    other nerves which can be potentially damaged.       4. Bleeding: Bleeding is more common if the patient is taking blood thinners such as  aspirin, Coumadin, Ticiid, Plavix, etc., or if he/she have some genetic predisposition  such as hemophilia. Bleeding into the spinal canal can cause compression of the spinal  cord with subsequent paralysis.  This would require an emergency surgery to  decompress and there are no guarantees that the patient would recover from the  paralysis.       5. Pneumothorax:  Puncturing of a lung is a possibility, every time a needle is introduced in  the area of the chest or upper back.  Pneumothorax refers to free air around the  collapsed lung(s), inside of the thoracic cavity (chest cavity).  Another two possible  complications related to a similar event would include: Hemothorax and Chylothorax.   These are variations of the Pneumothorax, where instead of air around the collapsed  lung(s), you may have blood or chyle, respectively.       6.  Spinal headaches: They may occur with any procedures in the area of the spine.       7. Persistent CSF (Cerebro-Spinal Fluid) leakage: This is a rare problem, but may occur  with prolonged intrathecal or epidural catheters either due to the formation of a fistulous  track or a dural tear.       8. Nerve damage: By working so close to the spinal cord, there is always a possibility of  nerve damage, which could be as serious as a  permanent spinal cord injury with  paralysis.       9. Death:  Although rare, severe deadly allergic reactions known as "Anaphylactic  reaction" can occur to any of the medications used.      10. Worsening of the symptoms:  We can always make thing worse.  What are the chances of something like this happening? Chances of any of this occuring are extremely low.  By statistics, you have more of a chance of getting killed in a motor vehicle accident: while driving to the hospital than any of the above occurring .  Nevertheless, you should be aware that they are possibilities.  In general, it is similar to taking a shower.  Everybody knows that you can slip, hit your head and get killed.  Does that mean that you should not shower again?  Nevertheless always keep in mind that statistics do not mean anything if you happen to be on the wrong side of them.  Even if a procedure has a 1 (one) in a 1,000,000 (million) chance of going wrong, it you happen to be that one..Also, keep in mind that by statistics, you have more of a chance of having something go wrong when taking medications.  Who should not have this procedure? If you are on a blood thinning medication (e.g. Coumadin, Plavix, see list of "Blood Thinners"), or if you have an active infection going on, you should not have the procedure.  If you are taking any blood thinners, please inform your physician.  How should I prepare for this procedure?  Do not eat or drink anything at least six hours prior to the procedure.  Bring a driver with you .  It cannot be a taxi.  Come accompanied by an adult that can drive you back, and that is strong enough to help you if your legs get weak or numb from the local anesthetic.  Take all of your medicines the morning of the procedure with just enough water to swallow them.  If you have diabetes, make sure that you are scheduled to have your procedure done first thing in the morning, whenever possible.  If you  have diabetes, take only half of your insulin dose and notify our nurse that you have done so as soon as you arrive at the clinic.  If you are diabetic, but only take blood sugar pills (oral hypoglycemic), then do not take them on the morning of your procedure.  You may take them after you have had the procedure.  Do not take aspirin or any aspirin-containing medications, at least eleven (11) days prior to the procedure.  They may prolong bleeding.  Wear loose fitting clothing that may be easy to take off and that you would not mind if it got stained with Betadine or blood.  Do not wear any jewelry or perfume  Remove any nail coloring.  It will interfere with some of our monitoring equipment.  NOTE: Remember that this is not  meant to be interpreted as a complete list of all possible complications.  Unforeseen problems may occur.  BLOOD THINNERS The following drugs contain aspirin or other products, which can cause increased bleeding during surgery and should not be taken for 2 weeks prior to and 1 week after surgery.  If you should need take something for relief of minor pain, you may take acetaminophen which is found in Tylenol,m Datril, Anacin-3 and Panadol. It is not blood thinner. The products listed below are.  Do not take any of the products listed below in addition to any listed on your instruction sheet.  A.P.C or A.P.C with Codeine Codeine Phosphate Capsules #3 Ibuprofen Ridaura  ABC compound Congesprin Imuran rimadil  Advil Cope Indocin Robaxisal  Alka-Seltzer Effervescent Pain Reliever and Antacid Coricidin or Coricidin-D  Indomethacin Rufen  Alka-Seltzer plus Cold Medicine Cosprin Ketoprofen S-A-C Tablets  Anacin Analgesic Tablets or Capsules Coumadin Korlgesic Salflex  Anacin Extra Strength Analgesic tablets or capsules CP-2 Tablets Lanoril Salicylate  Anaprox Cuprimine Capsules Levenox Salocol  Anexsia-D Dalteparin Magan Salsalate  Anodynos Darvon compound Magnesium  Salicylate Sine-off  Ansaid Dasin Capsules Magsal Sodium Salicylate  Anturane Depen Capsules Marnal Soma  APF Arthritis pain formula Dewitt's Pills Measurin Stanback  Argesic Dia-Gesic Meclofenamic Sulfinpyrazone  Arthritis Bayer Timed Release Aspirin Diclofenac Meclomen Sulindac  Arthritis pain formula Anacin Dicumarol Medipren Supac  Analgesic (Safety coated) Arthralgen Diffunasal Mefanamic Suprofen  Arthritis Strength Bufferin Dihydrocodeine Mepro Compound Suprol  Arthropan liquid Dopirydamole Methcarbomol with Aspirin Synalgos  ASA tablets/Enseals Disalcid Micrainin Tagament  Ascriptin Doan's Midol Talwin  Ascriptin A/D Dolene Mobidin Tanderil  Ascriptin Extra Strength Dolobid Moblgesic Ticlid  Ascriptin with Codeine Doloprin or Doloprin with Codeine Momentum Tolectin  Asperbuf Duoprin Mono-gesic Trendar  Aspergum Duradyne Motrin or Motrin IB Triminicin  Aspirin plain, buffered or enteric coated Durasal Myochrisine Trigesic  Aspirin Suppositories Easprin Nalfon Trillsate  Aspirin with Codeine Ecotrin Regular or Extra Strength Naprosyn Uracel  Atromid-S Efficin Naproxen Ursinus  Auranofin Capsules Elmiron Neocylate Vanquish  Axotal Emagrin Norgesic Verin  Azathioprine Empirin or Empirin with Codeine Normiflo Vitamin E  Azolid Emprazil Nuprin Voltaren  Bayer Aspirin plain, buffered or children's or timed BC Tablets or powders Encaprin Orgaran Warfarin Sodium  Buff-a-Comp Enoxaparin Orudis Zorpin  Buff-a-Comp with Codeine Equegesic Os-Cal-Gesic   Buffaprin Excedrin plain, buffered or Extra Strength Oxalid   Bufferin Arthritis Strength Feldene Oxphenbutazone   Bufferin plain or Extra Strength Feldene Capsules Oxycodone with Aspirin   Bufferin with Codeine Fenoprofen Fenoprofen Pabalate or Pabalate-SF   Buffets II Flogesic Panagesic   Buffinol plain or Extra Strength Florinal or Florinal with Codeine Panwarfarin   Buf-Tabs Flurbiprofen Penicillamine   Butalbital Compound Four-way  cold tablets Penicillin   Butazolidin Fragmin Pepto-Bismol   Carbenicillin Geminisyn Percodan   Carna Arthritis Reliever Geopen Persantine   Carprofen Gold's salt Persistin   Chloramphenicol Goody's Phenylbutazone   Chloromycetin Haltrain Piroxlcam   Clmetidine heparin Plaquenil   Cllnoril Hyco-pap Ponstel   Clofibrate Hydroxy chloroquine Propoxyphen         Before stopping any of these medications, be sure to consult the physician who ordered them.  Some, such as Coumadin (Warfarin) are ordered to prevent or treat serious conditions such as "deep thrombosis", "pumonary embolisms", and other heart problems.  The amount of time that you may need off of the medication may also vary with the medication and the reason for which you were taking it.  If you are taking any of these medications, please make sure you  notify your pain physician before you undergo any procedures.         Facet Joint Block The facet joints connect the bones of the spine (vertebrae). They make it possible for you to bend, twist, and make other movements with your spine. They also prevent you from overbending, overtwisting, and making other excessive movements.  A facet joint block is a procedure where a numbing medicine (anesthetic) is injected into a facet joint. Often, a type of anti-inflammatory medicine called a steroid is also injected. A facet joint block may be done for two reasons:  2. Diagnosis. A facet joint block may be done as a test to see whether neck or back pain is caused by a worn-down or infected facet joint. If the pain gets better after a facet joint block, it means the pain is probably coming from the facet joint. If the pain does not get better, it means the pain is probably not coming from the facet joint.  3. Therapy. A facet joint block may be done to relieve neck or back pain caused by a facet joint. A facet joint block is only done as a therapy if the pain does not improve with medicine,  exercise programs, physical therapy, and other forms of pain management. LET [facility] CARE PROVIDER KNOW ABOUT:   Any allergies you have.   All medicines you are taking, including vitamins, herbs, eyedrops, and over-the-counter medicines and creams.   Previous problems you or members of your family have had with the use of anesthetics.   Any blood disorders you have had.   Other health problems you have. RISKS AND COMPLICATIONS Generally, having a facet joint block is safe. However, as with any procedure, complications can occur. Possible complications associated with having a facet joint block include:   Bleeding.   Injury to a nerve near the injection site.   Pain at the injection site.   Weakness or numbness in areas controlled by nerves near the injection site.   Infection.   Temporary fluid retention.   Allergic reaction to anesthetics or medicines used during the procedure. BEFORE THE PROCEDURE   Follow your health care provider's instructions if you are taking dietary supplements or medicines. You may need to stop taking them or reduce your dosage.   Do not take any new dietary supplements or medicines without asking your health care provider first.   Follow your health care provider's instructions about eating and drinking before the procedure. You may need to stop eating and drinking several hours before the procedure.   Arrange to have an adult drive you home after the procedure. PROCEDURE 12. You may need to remove your clothing and dress in an open-back gown so that your health care provider can access your spine.  13. The procedure will be done while you are lying on an X-ray table. Most of the time you will be asked to lie on your stomach, but you may be asked to lie in a different position if an injection will be made in your neck.  14. Special machines will be used to monitor your oxygen levels, heart rate, and blood pressure.  15. If an  injection will be made in your neck, an intravenous (IV) tube will be inserted into one of your veins. Fluids and medicine will flow directly into your body through the IV tube.  16. The area over the facet joint where the injection will be made will be cleaned with an antiseptic soap. The  surrounding skin will be covered with sterile drapes.  17. An anesthetic will be applied to your skin to make the injection area numb. You may feel a temporary stinging or burning sensation.  18. A video X-ray machine will be used to locate the joint. A contrast dye may be injected into the facet joint area to help with locating the joint.  19. When the joint is located, an anesthetic medicine will be injected into the joint through the needle.  58. Your health care provider will ask you whether you feel pain relief. If you do feel relief, a steroid may be injected to provide pain relief for a longer period of time. If you do not feel relief or feel only partial relief, additional injections of an anesthetic may be made in other facet joints.  21. The needle will be removed, the skin will be cleansed, and bandages will be applied.  AFTER THE PROCEDURE   You will be observed for 15-30 minutes before being allowed to go home. Do not drive. Have an adult drive you or take a taxi or public transportation instead.   If you feel pain relief, the pain will return in several hours or days when the anesthetic wears off.   You may feel pain relief 2-14 days after the procedure. The amount of time this relief lasts varies from person to person.   It is normal to feel some tenderness over the injected area(s) for 2 days following the procedure.   If you have diabetes, you may have a temporary increase in blood sugar. Document Released: 02/24/2007 Document Revised: 02/19/2014 Document Reviewed: 07/25/2012 [facility] Patient Information 2015 [place], [place]. This information is not intended to replace advice given  to you by your health care provider. Make sure you discuss any questions you have with your health care provider.

## 2015-05-09 NOTE — Progress Notes (Signed)
Subjective:    Patient ID: Susan Houston, female    DOB: 09/04/1961, 54 y.o.   MRN: [id number]  HPI  Patient is 54 year old female returns to [facility] for further evaluation and treatment of pain involving the lower back and lower extremity regions dominantly. Patient denies any recent trauma change in events of daily living the call significant change in symptomatology. Patient states that her pain increases with twisting turning maneuvers. Patient states the pain becomes more intense as patient spends more time on the feet. We discussed patient's condition and will consider patient for interventional treatment at time return appointment to consist of lumbar facet, medial branch nerve, blocks patient was understanding and in agreement with suggested treatment plan. We'll also discussed. Patient's medications. We have obtained documentation from Dr. Kasandra Knudsen stating that patient is an appropriate and safe patient to receive opioid medications. We discussed patient's medications and will begin Nucynta today patient will avoid oxycodone, hydrocodone, a similar medications partake in the [place] . Explained to patient the respiratory depression confusion and other undesirable side effects which can occur with this medication and other medications. The patient expressed understanding and will exercise TO avoid undesirable side effects. The patient was understanding and in agreement with suggested treatment plan.    Review of Systems     Objective:   Physical Exam There was tends to palpation splenius capitis and occipitalis musculature region palpation which reproduced mild discomfort. There was mild tenderness of the cervical facet cervical paraspinal musculature region as well as mild tends to palpation of the thoracic facet thoracic paraspinal musculature region. There was mild tenderness of the acromial clavicular glenohumeral joint region. Patient appeared to be with bilaterally equal grip  strength. Tinel and Phalen's maneuver were without increase of pain significant degree. There was no crepitus of the thoracic region noted. Extension and palpation over the lumbar facets reproduce severe pain. Lateral bending and rotation reproduce severe pain there was tends to palpation of the PSIS PII S region as well as the gluteal and piriformis musculature regions. Straight leg raising was tolerates approximately 20 without a definite increase of pain with dorsiflexion noted. There was negative clonus negative Homans. DTRs difficult to elicit patient had difficulty relaxing. EHL strength appeared to be slightly decreased. No definite sensory deficit of dermatomal distribution was detected. There was tenderness of the PSIS and PII S region of moderate degree. Severe increase of pain with palpation over the lumbar facets lumbar paraspinal musculature region. There was negative clonus negative Homans. Abdomen was nontender and no costovertebral tenderness was noted.      Assessment & Plan:  Degenerative disc disease lumbar spine Degenerative changes L4-5 disc desiccation, circumferential bulging and focal protrusion in the left neural foramen to extraforaminal region with facet and ligamentous hypertrophy, L5-S1 facet degeneration with no disc abnormalities.  Lumbar facet syndrome  Sacroiliac joint dysfunction   Plan  Continue present medications and avoid taking oxycodone or hydrocodone or similar medications. Patient was given prescription for Nucynta and instructed regarding use of the medication as well as side effects including respiratory depression confusion and other undesirable side effects. Patient expressed understanding and willingness to comply in attempt to avoid undesirable side effects.  F/U PCP Dr. Jeananne Rama for evaliation of  BP and general medical  Condition.  F/U psych evaluation with Dr. Kasandra Knudsen as planned and as discussed  F/U surgical evaluation  F/U neurological  evaluation  May consider radiofrequency rhizolysis or intraspinal procedures pending response to present treatment and  F/U evaluation.  Patient to call [facility] Management Center should patient have concerns prior to scheduled return appointment.

## 2015-05-09 NOTE — Progress Notes (Signed)
Discharged to home, ambulatory with script for Nucynta.  Pre procedure instructions given with teach back 3 done. Approval give to prescribe opiods from Dr Kasandra Knudsen.  UDS done

## 2015-05-10 ENCOUNTER — Telehealth: Payer: Self-pay | Admitting: Pain Medicine

## 2015-05-10 NOTE — Telephone Encounter (Signed)
Script she was given yesterday req prior auth she wants to know what she needs to do so she can get her meds

## 2015-05-13 NOTE — Telephone Encounter (Signed)
Authorization in progress per K. Tice- started on 05-10-15

## 2015-05-20 ENCOUNTER — Ambulatory Visit: Payer: Medicare Other | Attending: Pain Medicine | Admitting: Pain Medicine

## 2015-05-20 VITALS — BP 98/47 | HR 56 | Temp 97.8°F | Resp 14 | Ht 62.0 in | Wt 182.0 lb

## 2015-05-20 DIAGNOSIS — M5126 Other intervertebral disc displacement, lumbar region: Secondary | ICD-10-CM | POA: Diagnosis not present

## 2015-05-20 DIAGNOSIS — M79605 Pain in left leg: Secondary | ICD-10-CM | POA: Diagnosis present

## 2015-05-20 DIAGNOSIS — M47816 Spondylosis without myelopathy or radiculopathy, lumbar region: Secondary | ICD-10-CM

## 2015-05-20 DIAGNOSIS — M533 Sacrococcygeal disorders, not elsewhere classified: Secondary | ICD-10-CM

## 2015-05-20 DIAGNOSIS — M79604 Pain in right leg: Secondary | ICD-10-CM | POA: Diagnosis present

## 2015-05-20 DIAGNOSIS — M47817 Spondylosis without myelopathy or radiculopathy, lumbosacral region: Secondary | ICD-10-CM | POA: Diagnosis not present

## 2015-05-20 DIAGNOSIS — M5137 Other intervertebral disc degeneration, lumbosacral region: Secondary | ICD-10-CM

## 2015-05-20 DIAGNOSIS — M5136 Other intervertebral disc degeneration, lumbar region: Secondary | ICD-10-CM

## 2015-05-20 DIAGNOSIS — M545 Low back pain: Secondary | ICD-10-CM | POA: Diagnosis present

## 2015-05-20 MED ORDER — CEFAZOLIN SODIUM 1 G IJ SOLR
INTRAMUSCULAR | Status: AC
  Administered 2015-05-20: 1 g via INTRAVENOUS
  Filled 2015-05-20: qty 10

## 2015-05-20 MED ORDER — MIDAZOLAM HCL 5 MG/5ML IJ SOLN
INTRAMUSCULAR | Status: AC
  Filled 2015-05-20: qty 5

## 2015-05-20 MED ORDER — TRIAMCINOLONE ACETONIDE 40 MG/ML IJ SUSP
INTRAMUSCULAR | Status: AC
  Administered 2015-05-20: 40 mg
  Filled 2015-05-20: qty 1

## 2015-05-20 MED ORDER — FENTANYL CITRATE (PF) 100 MCG/2ML IJ SOLN
INTRAMUSCULAR | Status: AC
  Administered 2015-05-20: 100 ug via INTRAVENOUS
  Filled 2015-05-20: qty 2

## 2015-05-20 MED ORDER — ORPHENADRINE CITRATE 30 MG/ML IJ SOLN
INTRAMUSCULAR | Status: AC
  Filled 2015-05-20: qty 2

## 2015-05-20 MED ORDER — BUPIVACAINE HCL (PF) 0.25 % IJ SOLN
INTRAMUSCULAR | Status: AC
  Administered 2015-05-20: 20 mL
  Filled 2015-05-20: qty 30

## 2015-05-20 NOTE — Progress Notes (Signed)
Safety precautions to be maintained throughout the outpatient stay will include: orient to surroundings, keep bed in low position, maintain call bell within reach at all times, provide assistance with transfer out of bed and ambulation.  

## 2015-05-20 NOTE — Progress Notes (Signed)
Subjective:    Patient ID: Susan Houston, female    DOB: 14-Feb-1961, 54 y.o.   MRN: [id number]  HPI  PROCEDURE PERFORMED: Lumbar facet (medial branch block)   NOTE: The patient is a 54 y.o. female who returns to [place] for further evaluation and treatment of pain involving the lumbar and lower extremity region. MRI  revealed the patient to be with evidence of degenerative changes lumbar spine with L4-L5 disc desiccation, circumferential bulging and focal protrusion on the left in the region of the left neural foramen to extraforaminal region with facet and ligamentous hypertrophy. L5-S1 facet degenerative changes with no disc abnormalities noted. The risks, benefits, and expectations of the procedure have been discussed and explained to the patient who was understanding and in agreement with suggested treatment plan. We will proceed with interventional treatment as discussed and as explained to the patient who was understanding and wished to proceed with procedure as planned.   DESCRIPTION OF PROCEDURE: Lumbar facet (medial branch block) with IV Versed, IV fentanyl conscious sedation, EKG, blood pressure, pulse, and pulse oximetry monitoring. The procedure was performed with the patient in the prone position. Betadine prep of proposed entry site performed.   NEEDLE PLACEMENT AT: Left L 3 lumbar facet (medial branch block). Under fluoroscopic guidance with oblique orientation of 15 degrees, a 22-gauge needle was inserted at the L 3 vertebral body level with needle placed at the targeted area of Burton's Eye or Eye of the Scotty Dog with documentation of needle placement in the superior and lateral border of targeted area of Burton's Eye or Eye of the Scotty Dog with oblique orientation of 15 degrees. Following documentation of needle placement at the L 3 vertebral body level, needle placement was then accomplished at the L 4 vertebral body level.   NEEDLE PLACEMENT AT L4 and L5 VERTEBRAL  BODY LEVELS ON THE LEFT SIDE The procedure was performed at the L4 and L5 vertebral body levels exactly as was performed at the L 3 vertebral body level utilizing the same technique and under fluoroscopic guidance.  NEEDLE PLACEMENT AT THE SACRAL ALA with AP view of the lumbosacral spine. With the patient in the prone position, Betadine prep of proposed entry site accomplished, a 22 gauge needle was inserted in the region of the sacral ala (groove formed by the superior articulating process of S1 and the sacral wing). Following documentation of needle placement at the sacral ala,  needle placement was then accomplished at the S1 foramen level.   NEEDLE PLACEMENT AT THE S1 FORAMEN LEVEL under fluoroscopic guidance with AP view of the lumbosacral spine and cephalad orientation of the fluoroscope, a 22-gauge needle was placed at the superior and lateral border of the S1 foramen under fluoroscopic guidance. Following documentation of needle placement at the S1 foramen.   Needle placement was then verified at all levels on lateral view. Following documentation of needle placement at all levels on lateral view and following negative aspiration for heme and CSF, each level was injected with 1 mL of 0.25% bupivacaine with Kenalog.     LUMBAR FACET, MEDIAL BRANCH NERVE, BLOCKS PERFORMED ON THE RIGHT SIDE   The procedure was performed on the right side exactly as was performed on the left side at the same levels and utilizing the same technique under fluoroscopic guidance.     The patient tolerated the procedure well. A total of 40 mg of Kenalog was utilized for the procedure.   PLAN:  1. Medications:  The patient will continue presently prescribed medications. We have prescribed Nucynta for the patient. 2. May consider modification of treatment regimen at time of return appointment pending response to treatment rendered on today's visit. 3. The patient is to follow-up with primary care physician Dr.  Jeananne Rama for further evaluation of blood pressure and general medical condition status post steroid injection performed on today's visit. 4. Surgical follow-up evaluation. 5. Neurological follow-up evaluation and Psych   follow-up evaluation with Dr.Su 6. The patient may be candidate for radiofrequency procedures, implantation type procedures, and other treatment pending response to treatment and follow-up evaluation. 7. The patient has been advised to call the Pain Management Center prior to scheduled return appointment should there be significant change in condition or should patient have other concerns regarding condition prior to scheduled return appointment.  The patient is understanding and in agreement with suggested treatment plan.     Review of Systems     Objective:   Physical Exam        Assessment & Plan:

## 2015-05-20 NOTE — Patient Instructions (Addendum)
Continue present medications  F/U PCP Dr. Jeananne Rama for evaliation of  BP and general medical  condition.. See Dr. Jeananne Rama this week for evaluation of blood pressure which was noted to be low on today's visit  F/U surgical evaluation  F/U neurological evaluation  F/U psych evaluation with Dr.Su as planned  May consider radiofrequency rhizolysis or intraspinal procedures pending response to present treatment and F/U evaluation.  Patient to call Pain Management Center should patient have concerns prior to scheduled return appointment. Pain Management Discharge Instructions  General Discharge Instructions :  If you need to reach your doctor call: Monday-Friday 8:00 am - 4:00 pm at [phone] or toll free [phone].  After clinic hours [phone] to have operator reach doctor.  Bring all of your medication bottles to all your appointments in the pain clinic.  To cancel or reschedule your appointment with Pain Management please remember to call 24 hours in advance to avoid a fee.  Refer to the educational materials which you have been given on: General Risks, I had my Procedure. Discharge Instructions, Post Sedation.  Post Procedure Instructions:  The drugs you were given will stay in your system until tomorrow, so for the next 24 hours you should not drive, make any legal decisions or drink any alcoholic beverages.  You may eat anything you prefer, but it is better to start with liquids then soups and crackers, and gradually work up to solid foods.  Please notify your doctor immediately if you have any unusual bleeding, trouble breathing or pain that is not related to your normal pain.  Depending on the type of procedure that was done, some parts of your body may feel week and/or numb.  This usually clears up by tonight or the next day.  Walk with the use of an assistive device or accompanied by an adult for the 24 hours.  You may use ice on the affected area for the first 24  hours.  Put ice in a Ziploc bag and cover with a towel and place against area 15 minutes on 15 minutes off.  You may switch to heat after 24 hours.Facet Joint Block The facet joints connect the bones of the spine (vertebrae). They make it possible for you to bend, twist, and make other movements with your spine. They also prevent you from overbending, overtwisting, and making other excessive movements.  A facet joint block is a procedure where a numbing medicine (anesthetic) is injected into a facet joint. Often, a type of anti-inflammatory medicine called a steroid is also injected. A facet joint block may be done for two reasons:   Diagnosis. A facet joint block may be done as a test to see whether neck or back pain is caused by a worn-down or infected facet joint. If the pain gets better after a facet joint block, it means the pain is probably coming from the facet joint. If the pain does not get better, it means the pain is probably not coming from the facet joint.   Therapy. A facet joint block may be done to relieve neck or back pain caused by a facet joint. A facet joint block is only done as a therapy if the pain does not improve with medicine, exercise programs, physical therapy, and other forms of pain management. LET [facility] CARE PROVIDER KNOW ABOUT:   Any allergies you have.   All medicines you are taking, including vitamins, herbs, eyedrops, and over-the-counter medicines and creams.   Previous problems you or members  of your family have had with the use of anesthetics.   Any blood disorders you have had.   Other health problems you have. RISKS AND COMPLICATIONS Generally, having a facet joint block is safe. However, as with any procedure, complications can occur. Possible complications associated with having a facet joint block include:   Bleeding.   Injury to a nerve near the injection site.   Pain at the injection site.   Weakness or numbness in areas controlled  by nerves near the injection site.   Infection.   Temporary fluid retention.   Allergic reaction to anesthetics or medicines used during the procedure. BEFORE THE PROCEDURE   Follow your health care provider's instructions if you are taking dietary supplements or medicines. You may need to stop taking them or reduce your dosage.   Do not take any new dietary supplements or medicines without asking your health care provider first.   Follow your health care provider's instructions about eating and drinking before the procedure. You may need to stop eating and drinking several hours before the procedure.   Arrange to have an adult drive you home after the procedure. PROCEDURE  You may need to remove your clothing and dress in an open-back gown so that your health care provider can access your spine.   The procedure will be done while you are lying on an X-ray table. Most of the time you will be asked to lie on your stomach, but you may be asked to lie in a different position if an injection will be made in your neck.   Special machines will be used to monitor your oxygen levels, heart rate, and blood pressure.   If an injection will be made in your neck, an intravenous (IV) tube will be inserted into one of your veins. Fluids and medicine will flow directly into your body through the IV tube.   The area over the facet joint where the injection will be made will be cleaned with an antiseptic soap. The surrounding skin will be covered with sterile drapes.   An anesthetic will be applied to your skin to make the injection area numb. You may feel a temporary stinging or burning sensation.   A video X-ray machine will be used to locate the joint. A contrast dye may be injected into the facet joint area to help with locating the joint.   When the joint is located, an anesthetic medicine will be injected into the joint through the needle.   Your health care provider will ask you  whether you feel pain relief. If you do feel relief, a steroid may be injected to provide pain relief for a longer period of time. If you do not feel relief or feel only partial relief, additional injections of an anesthetic may be made in other facet joints.   The needle will be removed, the skin will be cleansed, and bandages will be applied.  AFTER THE PROCEDURE   You will be observed for 15-30 minutes before being allowed to go home. Do not drive. Have an adult drive you or take a taxi or public transportation instead.   If you feel pain relief, the pain will return in several hours or days when the anesthetic wears off.   You may feel pain relief 2-14 days after the procedure. The amount of time this relief lasts varies from person to person.   It is normal to feel some tenderness over the injected area(s) for 2 days following  the procedure.   If you have diabetes, you may have a temporary increase in blood sugar. Document Released: 02/24/2007 Document Revised: 02/19/2014 Document Reviewed: 07/25/2012 [facility] Patient Information 2015 [place], [place]. This information is not intended to replace advice given to you by your health care provider. Make sure you discuss any questions you have with your health care provider.

## 2015-05-21 ENCOUNTER — Other Ambulatory Visit: Payer: Self-pay | Admitting: Pain Medicine

## 2015-05-21 ENCOUNTER — Telehealth: Payer: Self-pay | Admitting: *Deleted

## 2015-05-21 NOTE — Telephone Encounter (Signed)
Message left

## 2015-05-22 ENCOUNTER — Other Ambulatory Visit: Payer: Self-pay | Admitting: Pain Medicine

## 2015-05-22 DIAGNOSIS — M5137 Other intervertebral disc degeneration, lumbosacral region: Secondary | ICD-10-CM

## 2015-05-22 DIAGNOSIS — M5136 Other intervertebral disc degeneration, lumbar region: Secondary | ICD-10-CM

## 2015-05-22 DIAGNOSIS — M47816 Spondylosis without myelopathy or radiculopathy, lumbar region: Secondary | ICD-10-CM

## 2015-05-22 DIAGNOSIS — M533 Sacrococcygeal disorders, not elsewhere classified: Secondary | ICD-10-CM

## 2015-05-27 ENCOUNTER — Other Ambulatory Visit: Payer: Self-pay | Admitting: *Deleted

## 2015-05-27 ENCOUNTER — Inpatient Hospital Stay: Payer: Medicare Other | Admitting: Oncology

## 2015-05-27 ENCOUNTER — Inpatient Hospital Stay: Payer: Medicare Other | Attending: Oncology

## 2015-05-27 DIAGNOSIS — C329 Malignant neoplasm of larynx, unspecified: Secondary | ICD-10-CM

## 2015-05-30 ENCOUNTER — Telehealth: Payer: Self-pay

## 2015-05-30 DIAGNOSIS — K649 Unspecified hemorrhoids: Secondary | ICD-10-CM

## 2015-05-30 NOTE — Telephone Encounter (Signed)
Patient called stating she is having problems with hemorrhoids. She stated "I need them cut off". What can we do for her?

## 2015-05-30 NOTE — Telephone Encounter (Signed)
Refer to general surgery.  I will put an order in.

## 2015-05-31 NOTE — Telephone Encounter (Signed)
Called and let the patient know

## 2015-06-04 ENCOUNTER — Inpatient Hospital Stay: Payer: Medicare Other | Admitting: Oncology

## 2015-06-04 ENCOUNTER — Inpatient Hospital Stay: Payer: Medicare Other

## 2015-06-10 ENCOUNTER — Telehealth: Payer: Self-pay | Admitting: Pain Medicine

## 2015-06-10 NOTE — Telephone Encounter (Signed)
Per Dr. Primus Bravo patient may come for eval at 0700 tomorrow 06/11/15. Patient understands and confirms appointment.

## 2015-06-10 NOTE — Telephone Encounter (Signed)
Says she is out of meds and doesn't have appt until 06-18-15 what is she supposed to do ?

## 2015-06-11 ENCOUNTER — Encounter: Payer: Self-pay | Admitting: Pain Medicine

## 2015-06-11 ENCOUNTER — Ambulatory Visit: Payer: Medicare Other | Attending: Pain Medicine | Admitting: Pain Medicine

## 2015-06-11 VITALS — BP 113/58 | HR 57 | Temp 98.1°F | Resp 16 | Ht 62.0 in | Wt 176.0 lb

## 2015-06-11 DIAGNOSIS — M79605 Pain in left leg: Secondary | ICD-10-CM | POA: Diagnosis present

## 2015-06-11 DIAGNOSIS — M5126 Other intervertebral disc displacement, lumbar region: Secondary | ICD-10-CM | POA: Insufficient documentation

## 2015-06-11 DIAGNOSIS — M47816 Spondylosis without myelopathy or radiculopathy, lumbar region: Secondary | ICD-10-CM | POA: Insufficient documentation

## 2015-06-11 DIAGNOSIS — M461 Sacroiliitis, not elsewhere classified: Secondary | ICD-10-CM | POA: Diagnosis not present

## 2015-06-11 DIAGNOSIS — M791 Myalgia: Secondary | ICD-10-CM | POA: Diagnosis not present

## 2015-06-11 DIAGNOSIS — M533 Sacrococcygeal disorders, not elsewhere classified: Secondary | ICD-10-CM | POA: Diagnosis not present

## 2015-06-11 DIAGNOSIS — M5136 Other intervertebral disc degeneration, lumbar region: Secondary | ICD-10-CM | POA: Diagnosis not present

## 2015-06-11 DIAGNOSIS — M79604 Pain in right leg: Secondary | ICD-10-CM | POA: Diagnosis present

## 2015-06-11 DIAGNOSIS — M545 Low back pain: Secondary | ICD-10-CM | POA: Diagnosis present

## 2015-06-11 DIAGNOSIS — M5416 Radiculopathy, lumbar region: Secondary | ICD-10-CM | POA: Diagnosis not present

## 2015-06-11 DIAGNOSIS — M47817 Spondylosis without myelopathy or radiculopathy, lumbosacral region: Secondary | ICD-10-CM | POA: Diagnosis not present

## 2015-06-11 DIAGNOSIS — M5137 Other intervertebral disc degeneration, lumbosacral region: Secondary | ICD-10-CM

## 2015-06-11 MED ORDER — TAPENTADOL HCL 50 MG PO TABS: ORAL_TABLET | ORAL | Status: DC

## 2015-06-11 NOTE — Patient Instructions (Addendum)
Continue present medication Nucynta as discussed  Block of nerves to the sacroiliac joint to be performed at time of return appointment  F/U PCP Dr. Jeananne Rama for evaliation of  BP and general medical  condition  F/U surgical evaluation  F/U neurological evaluation  F/U psych evaluation with Dr. Kasandra Knudsen as planned and as discussed  May consider radiofrequency rhizolysis or intraspinal procedures pending response to present treatment and F/U evaluation   Patient to call Pain Management Center should patient have concerns prior to scheduled return appointmen.

## 2015-06-11 NOTE — Progress Notes (Signed)
Safety precautions to be maintained throughout the outpatient stay will include: orient to surroundings, keep bed in low position, maintain call bell within reach at all times, provide assistance with transfer out of bed and ambulation.  

## 2015-06-11 NOTE — Progress Notes (Signed)
   Subjective:    Patient ID: Susan Houston, female    DOB: 04-12-1961, 54 y.o.   MRN: [id number]  HPI  Patient is  Patient's 54-year-old female returns to [place] for further evaluation and treatment of pain involving the lower back and lower extremity regions. Patient states that her pain occurs in the lower back region radiates to the buttocks on the left as well as on the right. The pain is aggravated by standing walking twisting turning maneuvers. Patient has had difficulty climbing stairs as well. Patient denies any recent trauma change in events of daily living the call significant change in symptomatology. Patient is tolerating Nucynta well without any undesirable side effects. We will proceed interventional treatment at time return appointment consisting of block of nerves to the sacroiliac joint in attempt to decrease severity of symptoms, minimize progression of symptoms, and avoid the need for more involved treatment. The patient was with understanding and agreement with suggested treatment plan.     Review of Systems     Objective:   Physical Exam  There was mild tenderness of the splenius capitis and occipitalis musculature region. There was mild tinnitus of the cervical facet cervical paraspinal musculature region and mild tends to palpation over the thoracic facet thoracic paraspinal musculature region. Region of the acromioclavicular and glenohumeral joint regions reproduced minimal discomfort. There appeared to be in unremarkable Spurling's maneuver. Patient appeared to be with bilaterally equal grip strength. Tinel and Phalen's maneuver without increased pain of significant degree. Palpation over the thoracic paraspinal motion thoracic facet region was with mild tends to moderate tends to palpation the lower thoracic region with moderate muscle spasms. Palpation over the lumbar paraspinal muscles region lumbar facet region was with moderate muscle spasms and tends to  palpation. Dominant portion of patient's pain was reproduced with palpation over the PSIS and PII S regions on the left as well as on the right. There was also increased pain of mild degree of the greater trochanteric region. Straight leg raising tolerates approximately 20 without a definite increased pain with dorsiflexion notedand there was negative clonus negative Homans. Predominant portion of patient's pain was reproduced with palpation over the PSIS and PII S region and the lower lumbar facet regions. Abdomen was nontender and no costovertebral angle tenderness was noted.     Assessment & Plan:    Degenerative disc disease lumbar spine Degenerative changes L4-5 disc desiccation, circumferential bulging and focal protrusion in the left neural foramen to extraforaminal region with facet and ligamentous hypertrophy, L5-S1 facet degeneration with no disc abnormalities.  Lumbar facet syndrome  Sacroiliac joint dysfunction   Plan  Continue present medication Nucynta  Block of nerves to the sacroiliac joint to be performed at time return appointment  F/U PCP Dr. Jeananne Rama for evaliation of  BP and general medical  condition  F/U surgical evaluation  F/U neurological evaluation  F/U evaluation with Dr. Kasandra Knudsen for psych evaluation as discussed  May consider radiofrequency rhizolysis or intraspinal procedures pending response to present treatment and F/U evaluation   Patient to call Pain Management Center should patient have concerns prior to scheduled return appointmen.

## 2015-06-12 ENCOUNTER — Ambulatory Visit: Payer: Self-pay | Admitting: General Surgery

## 2015-06-17 ENCOUNTER — Encounter: Payer: Self-pay | Admitting: Pain Medicine

## 2015-06-17 ENCOUNTER — Ambulatory Visit: Payer: Medicare Other | Attending: Pain Medicine | Admitting: Pain Medicine

## 2015-06-17 VITALS — BP 107/60 | HR 50 | Temp 97.7°F | Resp 17 | Ht 62.0 in | Wt 188.0 lb

## 2015-06-17 DIAGNOSIS — M5137 Other intervertebral disc degeneration, lumbosacral region: Secondary | ICD-10-CM

## 2015-06-17 DIAGNOSIS — M47816 Spondylosis without myelopathy or radiculopathy, lumbar region: Secondary | ICD-10-CM

## 2015-06-17 DIAGNOSIS — M5136 Other intervertebral disc degeneration, lumbar region: Secondary | ICD-10-CM | POA: Diagnosis not present

## 2015-06-17 DIAGNOSIS — M47817 Spondylosis without myelopathy or radiculopathy, lumbosacral region: Secondary | ICD-10-CM | POA: Diagnosis not present

## 2015-06-17 DIAGNOSIS — M533 Sacrococcygeal disorders, not elsewhere classified: Secondary | ICD-10-CM

## 2015-06-17 DIAGNOSIS — M5126 Other intervertebral disc displacement, lumbar region: Secondary | ICD-10-CM | POA: Diagnosis not present

## 2015-06-17 DIAGNOSIS — M545 Low back pain: Secondary | ICD-10-CM | POA: Diagnosis present

## 2015-06-17 MED ORDER — CEFUROXIME AXETIL 250 MG PO TABS: 250.0000 mg | ORAL_TABLET | Freq: Two times a day (BID) | ORAL | Status: DC

## 2015-06-17 MED ORDER — CEFAZOLIN SODIUM 1 G IJ SOLR
INTRAMUSCULAR | Status: AC
  Administered 2015-06-17: 1 g via INTRAVENOUS
  Filled 2015-06-17: qty 10

## 2015-06-17 MED ORDER — TRIAMCINOLONE ACETONIDE 40 MG/ML IJ SUSP
INTRAMUSCULAR | Status: AC
  Administered 2015-06-17: 10 mg
  Filled 2015-06-17: qty 1

## 2015-06-17 MED ORDER — ORPHENADRINE CITRATE 30 MG/ML IJ SOLN
INTRAMUSCULAR | Status: AC
  Filled 2015-06-17: qty 2

## 2015-06-17 MED ORDER — FENTANYL CITRATE (PF) 100 MCG/2ML IJ SOLN
INTRAMUSCULAR | Status: AC
  Administered 2015-06-17: 100 ug via INTRAVENOUS
  Filled 2015-06-17: qty 2

## 2015-06-17 MED ORDER — BUPIVACAINE HCL (PF) 0.25 % IJ SOLN
INTRAMUSCULAR | Status: AC
  Administered 2015-06-17: 10 mL
  Filled 2015-06-17: qty 30

## 2015-06-17 MED ORDER — MIDAZOLAM HCL 5 MG/5ML IJ SOLN
INTRAMUSCULAR | Status: AC
  Administered 2015-06-17: 2 mg via INTRAVENOUS
  Filled 2015-06-17: qty 5

## 2015-06-17 NOTE — Patient Instructions (Addendum)
Continue present medication Nucynta and begin taking Ceftin antibiotic today. Please obtain new antibiotic Ceftin today  F/U PCP Dr. Jeananne Rama for evaliation of  BP and general medical  condition  F/U surgical evaluation  F/U neurological evaluation  F/U psych eval Dr. Kasandra Knudsen as discussed and as planned  May consider radiofrequency rhizolysis or intraspinal procedures pending response to present treatment and F/U evaluation   Patient to call [facility] should patient have concerns prior to scheduled return appointment. Pain Management Discharge Instructions  General Discharge Instructions :  If you need to reach your doctor call: Monday-Friday 8:00 am - 4:00 pm at [phone] or toll free [phone].  After clinic hours [phone] to have operator reach doctor.  Bring all of your medication bottles to all your appointments in the pain clinic.  To cancel or reschedule your appointment with Pain Management please remember to call 24 hours in advance to avoid a fee.  Refer to the educational materials which you have been given on: General Risks, I had my Procedure. Discharge Instructions, Post Sedation.  Post Procedure Instructions:  The drugs you were given will stay in your system until tomorrow, so for the next 24 hours you should not drive, make any legal decisions or drink any alcoholic beverages.  You may eat anything you prefer, but it is better to start with liquids then soups and crackers, and gradually work up to solid foods.  Please notify your doctor immediately if you have any unusual bleeding, trouble breathing or pain that is not related to your normal pain.  Depending on the type of procedure that was done, some parts of your body may feel week and/or numb.  This usually clears up by tonight or the next day.  Walk with the use of an assistive device or accompanied by an adult for the 24 hours.  You may use ice on the affected area for the first 24 hours.   Put ice in a Ziploc bag and cover with a towel and place against area 15 minutes on 15 minutes off.  You may switch to heat after 24 hours.GENERAL RISKS AND COMPLICATIONS  What are the risk, side effects and possible complications? Generally speaking, most procedures are safe.  However, with any procedure there are risks, side effects, and the possibility of complications.  The risks and complications are dependent upon the sites that are lesioned, or the type of nerve block to be performed.  The closer the procedure is to the spine, the more serious the risks are.  Great care is taken when placing the radio frequency needles, block needles or lesioning probes, but sometimes complications can occur. 1. Infection: Any time there is an injection through the skin, there is a risk of infection.  This is why sterile conditions are used for these blocks.  There are four possible types of infection. 1. Localized skin infection. 2. Central Nervous System Infection-This can be in the form of Meningitis, which can be deadly. 3. Epidural Infections-This can be in the form of an epidural abscess, which can cause pressure inside of the spine, causing compression of the spinal cord with subsequent paralysis. This would require an emergency surgery to decompress, and there are no guarantees that the patient would recover from the paralysis. 4. Discitis-This is an infection of the intervertebral discs.  It occurs in about 1% of discography procedures.  It is difficult to treat and it may lead to surgery.        2. Pain:  the needles have to go through skin and soft tissues, will cause soreness.       3. Damage to internal structures:  The nerves to be lesioned may be near blood vessels or    other nerves which can be potentially damaged.       4. Bleeding: Bleeding is more common if the patient is taking blood thinners such as  aspirin, Coumadin, Ticiid, Plavix, etc., or if he/she have some genetic predisposition  such  as hemophilia. Bleeding into the spinal canal can cause compression of the spinal  cord with subsequent paralysis.  This would require an emergency surgery to  decompress and there are no guarantees that the patient would recover from the  paralysis.       5. Pneumothorax:  Puncturing of a lung is a possibility, every time a needle is introduced in  the area of the chest or upper back.  Pneumothorax refers to free air around the  collapsed lung(s), inside of the thoracic cavity (chest cavity).  Another two possible  complications related to a similar event would include: Hemothorax and Chylothorax.   These are variations of the Pneumothorax, where instead of air around the collapsed  lung(s), you may have blood or chyle, respectively.       6. Spinal headaches: They may occur with any procedures in the area of the spine.       7. Persistent CSF (Cerebro-Spinal Fluid) leakage: This is a rare problem, but may occur  with prolonged intrathecal or epidural catheters either due to the formation of a fistulous  track or a dural tear.       8. Nerve damage: By working so close to the spinal cord, there is always a possibility of  nerve damage, which could be as serious as a permanent spinal cord injury with  paralysis.       9. Death:  Although rare, severe deadly allergic reactions known as "Anaphylactic  reaction" can occur to any of the medications used.      10. Worsening of the symptoms:  We can always make thing worse.  What are the chances of something like this happening? Chances of any of this occuring are extremely low.  By statistics, you have more of a chance of getting killed in a motor vehicle accident: while driving to the hospital than any of the above occurring .  Nevertheless, you should be aware that they are possibilities.  In general, it is similar to taking a shower.  Everybody knows that you can slip, hit your head and get killed.  Does that mean that you should not shower again?   Nevertheless always keep in mind that statistics do not mean anything if you happen to be on the wrong side of them.  Even if a procedure has a 1 (one) in a 1,000,000 (million) chance of going wrong, it you happen to be that one..Also, keep in mind that by statistics, you have more of a chance of having something go wrong when taking medications.  Who should not have this procedure? If you are on a blood thinning medication (e.g. Coumadin, Plavix, see list of "Blood Thinners"), or if you have an active infection going on, you should not have the procedure.  If you are taking any blood thinners, please inform your physician.  How should I prepare for this procedure?  Do not eat or drink anything at least six hours prior to the procedure.  Bring a driver with you .  It cannot be a taxi.  Come accompanied by an adult that can drive you back, and that is strong enough to help you if your legs get weak or numb from the local anesthetic.  Take all of your medicines the morning of the procedure with just enough water to swallow them.  If you have diabetes, make sure that you are scheduled to have your procedure done first thing in the morning, whenever possible.  If you have diabetes, take only half of your insulin dose and notify our nurse that you have done so as soon as you arrive at the clinic.  If you are diabetic, but only take blood sugar pills (oral hypoglycemic), then do not take them on the morning of your procedure.  You may take them after you have had the procedure.  Do not take aspirin or any aspirin-containing medications, at least eleven (11) days prior to the procedure.  They may prolong bleeding.  Wear loose fitting clothing that may be easy to take off and that you would not mind if it got stained with Betadine or blood.  Do not wear any jewelry or perfume  Remove any nail coloring.  It will interfere with some of our monitoring equipment.  NOTE: Remember that this is not  meant to be interpreted as a complete list of all possible complications.  Unforeseen problems may occur.  BLOOD THINNERS The following drugs contain aspirin or other products, which can cause increased bleeding during surgery and should not be taken for 2 weeks prior to and 1 week after surgery.  If you should need take something for relief of minor pain, you may take acetaminophen which is found in Tylenol,m Datril, Anacin-3 and Panadol. It is not blood thinner. The products listed below are.  Do not take any of the products listed below in addition to any listed on your instruction sheet.  A.P.C or A.P.C with Codeine Codeine Phosphate Capsules #3 Ibuprofen Ridaura  ABC compound Congesprin Imuran rimadil  Advil Cope Indocin Robaxisal  Alka-Seltzer Effervescent Pain Reliever and Antacid Coricidin or Coricidin-D  Indomethacin Rufen  Alka-Seltzer plus Cold Medicine Cosprin Ketoprofen S-A-C Tablets  Anacin Analgesic Tablets or Capsules Coumadin Korlgesic Salflex  Anacin Extra Strength Analgesic tablets or capsules CP-2 Tablets Lanoril Salicylate  Anaprox Cuprimine Capsules Levenox Salocol  Anexsia-D Dalteparin Magan Salsalate  Anodynos Darvon compound Magnesium Salicylate Sine-off  Ansaid Dasin Capsules Magsal Sodium Salicylate  Anturane Depen Capsules Marnal Soma  APF Arthritis pain formula Dewitt's Pills Measurin Stanback  Argesic Dia-Gesic Meclofenamic Sulfinpyrazone  Arthritis Bayer Timed Release Aspirin Diclofenac Meclomen Sulindac  Arthritis pain formula Anacin Dicumarol Medipren Supac  Analgesic (Safety coated) Arthralgen Diffunasal Mefanamic Suprofen  Arthritis Strength Bufferin Dihydrocodeine Mepro Compound Suprol  Arthropan liquid Dopirydamole Methcarbomol with Aspirin Synalgos  ASA tablets/Enseals Disalcid Micrainin Tagament  Ascriptin Doan's Midol Talwin  Ascriptin A/D Dolene Mobidin Tanderil  Ascriptin Extra Strength Dolobid Moblgesic Ticlid  Ascriptin with Codeine Doloprin or  Doloprin with Codeine Momentum Tolectin  Asperbuf Duoprin Mono-gesic Trendar  Aspergum Duradyne Motrin or Motrin IB Triminicin  Aspirin plain, buffered or enteric coated Durasal Myochrisine Trigesic  Aspirin Suppositories Easprin Nalfon Trillsate  Aspirin with Codeine Ecotrin Regular or Extra Strength Naprosyn Uracel  Atromid-S Efficin Naproxen Ursinus  Auranofin Capsules Elmiron Neocylate Vanquish  Axotal Emagrin Norgesic Verin  Azathioprine Empirin or Empirin with Codeine Normiflo Vitamin E  Azolid Emprazil Nuprin Voltaren  Bayer Aspirin plain, buffered or children's or timed BC Tablets or powders Encaprin Orgaran Warfarin Sodium  Buff-a-Comp Enoxaparin Orudis Zorpin  Buff-a-Comp with Codeine Equegesic Os-Cal-Gesic   Buffaprin Excedrin plain, buffered or Extra Strength Oxalid   Bufferin Arthritis Strength Feldene Oxphenbutazone   Bufferin plain or Extra Strength Feldene Capsules Oxycodone with Aspirin   Bufferin with Codeine Fenoprofen Fenoprofen Pabalate or Pabalate-SF   Buffets II Flogesic Panagesic   Buffinol plain or Extra Strength Florinal or Florinal with Codeine Panwarfarin   Buf-Tabs Flurbiprofen Penicillamine   Butalbital Compound Four-way cold tablets Penicillin   Butazolidin Fragmin Pepto-Bismol   Carbenicillin Geminisyn Percodan   Carna Arthritis Reliever Geopen Persantine   Carprofen Gold's salt Persistin   Chloramphenicol Goody's Phenylbutazone   Chloromycetin Haltrain Piroxlcam   Clmetidine heparin Plaquenil   Cllnoril Hyco-pap Ponstel   Clofibrate Hydroxy chloroquine Propoxyphen         Before stopping any of these medications, be sure to consult the physician who ordered them.  Some, such as Coumadin (Warfarin) are ordered to prevent or treat serious conditions such as "deep thrombosis", "pumonary embolisms", and other heart problems.  The amount of time that you may need off of the medication may also vary with the medication and the reason for which you were  taking it.  If you are taking any of these medications, please make sure you notify your pain physician before you undergo any procedures.

## 2015-06-17 NOTE — Progress Notes (Signed)
Safety precautions to be maintained throughout the outpatient stay will include: orient to surroundings, keep bed in low position, maintain call bell within reach at all times, provide assistance with transfer out of bed and ambulation.  

## 2015-06-17 NOTE — Progress Notes (Signed)
Subjective:    Patient ID: Susan Houston, female    DOB: 1961/04/09, 54 y.o.   MRN: [id number]  HPI  PROCEDURE:  Block of nerves to the sacroiliac joint.   NOTE:  The patient is a 54 y.o. female who returns to the [facility] for further evaluation and treatment of pain involving the lower back and lower extremity region with pain in the region of the buttocks as well. Prior  MRI studies reveal degenerative disc disease lumbar spine Degenerative changes L4-5 disc desiccation, circumferential bulging and focal protrusion in the left neural foramen to extraforaminal region with facet and ligamentous hypertrophy, L5-S1 facet degeneration with no disc abnormalities.  The patient is with severe tenderness to palpation of the PSIS and PII S region and is with positive Patrick's maneuver  There is concern regarding a significant component of the patient's pain being due to sacroiliac joint dysfunction The risks, benefits, expectations of the procedure have been discussed and explained to the patient who is understanding and willing to proceed with interventional treatment in attempt to decrease severity of patient's symptoms, minimize the risk of medication escalation and  hopefully retard the progression of the patient's symptoms. We will proceed with what is felt to be a medically necessary procedure, block of nerves to the sacroiliac joint.   DESCRIPTION OF PROCEDURE:  Block of nerves to the sacroiliac joint.   The patient was taken to the fluoroscopy suite. With the patient in the prone position with EKG, blood pressure, pulse and pulse oximetry monitoring, IV Versed, IV fentanyl conscious sedation, Betadine prep of proposed entry site was performed.   Block of nerves at the L5 vertebral body level.   With the patient in prone position, under fluoroscopic guidance, a 22 -gauge needle was inserted at the L5 vertebral body level on the  left side. With 15 degrees oblique orientation a 22  -gauge needle was inserted in the region known as Burton's eye or eye of the Scotty dog. Following documentation of needle placement in the area of Burton's eye or eye of the Scotty dog under fluoroscopic guidance, needle placement was then accomplished at the sacral ala level on the  left side.   Needle placement at the sacral ala.   With the patient in prone position under fluoroscopic guidance with AP view of the lumbosacral spine, a 22 -gauge needle was inserted in the region known as the sacral ala on the  left side. Following documentation of needle placement on the  left side under fluoroscopic guidance needle placement was then accomplished at the S1 foramen level.   Needle placement at the S1 foramen level.   With the patient in prone position under fluoroscopic guidance with AP view of the lumbosacral spine and cephalad orientation, a 22 -gauge needle was inserted at the superior and lateral border of the S1 foramen on the  left side. Following documentation of needle placement at the S1 foramen level on the  left side, needle placement was then accomplished at the S2 foramen level on the  left side.   Needle placement at the S2 foramen level.   With the patient in prone position with AP view of the lumbosacral spine with cephalad orientation, a 22 - gauge needle was inserted at the superior and lateral border of the S2 foramen under fluoroscopic guidance on the  left side. Following needle placement at the L5 vertebral body level, sacral ala, S1 foramen and S2 foramen on the  left side,  needle placement was verified on lateral view under fluoroscopic guidance.  Following needle placement documentation on lateral view, each needle was injected with 1 mL of 0.25% bupivacaine and Kenalog.   BLOCK OF THE NERVES TO SACROILIAC JOINT ON THE RIGHT SIDE The procedure was performed on the right side at the same levels as was performed on the left side and utilizing the same technique as on the left  side and was performed under fluoroscopic guidance as on the left side   A total of '10mg'$  of Kenalog was utilized for the procedure.   PLAN:  1. Medications: The patient will continue presently prescribed medication  Nucynta.  2. The patient will be considered for modification of treatment regimen pending response to the procedure performed on today's visit.  3. The patient is to follow-up with primary care physician  Dr. Jeananne Rama for evaluation of blood pressure and general medical condition following the procedure performed on today's visit.  4. Surgical evaluation as discussed.  5. Neurological evaluation  and Psych follow-up evaluation with Dr Kasandra Knudsen as discussed.  6. The patient may be a candidate for radiofrequency procedures, implantation devices and other treatment pending response to treatment performed on today's visit and follow-up evaluation.  7. The patient has been advised to adhere to proper body mechanics and to avoid activities which may exacerbate the patient's symptoms.   Return appointment to [facility] as scheduled.      Review of Systems     Objective:   Physical Exam        Assessment & Plan:

## 2015-06-18 ENCOUNTER — Telehealth: Payer: Self-pay

## 2015-06-18 ENCOUNTER — Ambulatory Visit: Payer: Medicare Other | Admitting: Pain Medicine

## 2015-06-18 NOTE — Telephone Encounter (Signed)
Pt states she is ok

## 2015-06-19 ENCOUNTER — Inpatient Hospital Stay: Payer: Medicare Other | Admitting: Oncology

## 2015-06-19 ENCOUNTER — Inpatient Hospital Stay: Payer: Medicare Other

## 2015-07-11 ENCOUNTER — Ambulatory Visit: Payer: Medicare Other | Attending: Pain Medicine | Admitting: Pain Medicine

## 2015-07-11 ENCOUNTER — Encounter: Payer: Self-pay | Admitting: Pain Medicine

## 2015-07-11 VITALS — BP 114/59 | HR 64 | Temp 97.8°F | Resp 16 | Ht 62.0 in | Wt 188.0 lb

## 2015-07-11 DIAGNOSIS — M79605 Pain in left leg: Secondary | ICD-10-CM | POA: Diagnosis present

## 2015-07-11 DIAGNOSIS — M79604 Pain in right leg: Secondary | ICD-10-CM | POA: Diagnosis present

## 2015-07-11 DIAGNOSIS — M5136 Other intervertebral disc degeneration, lumbar region: Secondary | ICD-10-CM | POA: Insufficient documentation

## 2015-07-11 DIAGNOSIS — M47816 Spondylosis without myelopathy or radiculopathy, lumbar region: Secondary | ICD-10-CM | POA: Insufficient documentation

## 2015-07-11 DIAGNOSIS — M533 Sacrococcygeal disorders, not elsewhere classified: Secondary | ICD-10-CM | POA: Insufficient documentation

## 2015-07-11 DIAGNOSIS — M5137 Other intervertebral disc degeneration, lumbosacral region: Secondary | ICD-10-CM

## 2015-07-11 DIAGNOSIS — M461 Sacroiliitis, not elsewhere classified: Secondary | ICD-10-CM | POA: Diagnosis not present

## 2015-07-11 DIAGNOSIS — M545 Low back pain: Secondary | ICD-10-CM | POA: Diagnosis present

## 2015-07-11 DIAGNOSIS — M5126 Other intervertebral disc displacement, lumbar region: Secondary | ICD-10-CM | POA: Insufficient documentation

## 2015-07-11 DIAGNOSIS — M5416 Radiculopathy, lumbar region: Secondary | ICD-10-CM | POA: Diagnosis not present

## 2015-07-11 DIAGNOSIS — M47817 Spondylosis without myelopathy or radiculopathy, lumbosacral region: Secondary | ICD-10-CM | POA: Diagnosis not present

## 2015-07-11 MED ORDER — TAPENTADOL HCL 50 MG PO TABS: ORAL_TABLET | ORAL | Status: DC

## 2015-07-11 NOTE — Patient Instructions (Addendum)
PLAN   Continue present medication Nucynta  Block of nerves to the sacroiliac joint to be performed at time return appointment  F/U PCP Dr. Jeananne Rama for evaliation of  BP and general medical  condition  F/U surgical evaluation. May consider pending follow-up evaluations  F/U neurological evaluation. May consider pending follow-up evaluations  F/U  Dr Kasandra Knudsen as planned to discuss your medication Nucynta and the dose of Nucynta then follow-up with me as we discussed . Have Dr.Su prepare a note from me regarding your medication Nucynta   May consider radiofrequency rhizolysis or intraspinal procedures pending response to present treatment and F/U evaluation   [facility] to call [facility] should patient have concerns prior to scheduled return appointment. GENERAL RISKS AND COMPLICATIONS  What are the risk, side effects and possible complications? Generally speaking, most procedures are safe.  However, with any procedure there are risks, side effects, and the possibility of complications.  The risks and complications are dependent upon the sites that are lesioned, or the type of nerve block to be performed.  The closer the procedure is to the spine, the more serious the risks are.  Great care is taken when placing the radio frequency needles, block needles or lesioning probes, but sometimes complications can occur. 1. Infection: Any time there is an injection through the skin, there is a risk of infection.  This is why sterile conditions are used for these blocks.  There are four possible types of infection. 1. Localized skin infection. 2. Central Nervous System Infection-This can be in the form of Meningitis, which can be deadly. 3. Epidural Infections-This can be in the form of an epidural abscess, which can cause pressure inside of the spine, causing compression of the spinal cord with subsequent paralysis. This would require an emergency surgery to decompress, and there are no  guarantees that the patient would recover from the paralysis. 4. Discitis-This is an infection of the intervertebral discs.  It occurs in about 1% of discography procedures.  It is difficult to treat and it may lead to surgery.        2. Pain: the needles have to go through skin and soft tissues, will cause soreness.       3. Damage to internal structures:  The nerves to be lesioned may be near blood vessels or    other nerves which can be potentially damaged.       4. Bleeding: Bleeding is more common if the patient is taking blood thinners such as  aspirin, Coumadin, Ticiid, Plavix, etc., or if he/she have some genetic predisposition  such as hemophilia. Bleeding into the spinal canal can cause compression of the spinal  cord with subsequent paralysis.  This would require an emergency surgery to  decompress and there are no guarantees that the patient would recover from the  paralysis.       5. Pneumothorax:  Puncturing of a lung is a possibility, every time a needle is introduced in  the area of the chest or upper back.  Pneumothorax refers to free air around the  collapsed lung(s), inside of the thoracic cavity (chest cavity).  Another two possible  complications related to a similar event would include: Hemothorax and Chylothorax.   These are variations of the Pneumothorax, where instead of air around the collapsed  lung(s), you may have blood or chyle, respectively.       6. Spinal headaches: They may occur with any procedures in the area of the spine.  7. Persistent CSF (Cerebro-Spinal Fluid) leakage: This is a rare problem, but may occur  with prolonged intrathecal or epidural catheters either due to the formation of a fistulous  track or a dural tear.       8. Nerve damage: By working so close to the spinal cord, there is always a possibility of  nerve damage, which could be as serious as a permanent spinal cord injury with  paralysis.       9. Death:  Although rare, severe deadly allergic  reactions known as "Anaphylactic  reaction" can occur to any of the medications used.      10. Worsening of the symptoms:  We can always make thing worse.  What are the chances of something like this happening? Chances of any of this occuring are extremely low.  By statistics, you have more of a chance of getting killed in a motor vehicle accident: while driving to the hospital than any of the above occurring .  Nevertheless, you should be aware that they are possibilities.  In general, it is similar to taking a shower.  Everybody knows that you can slip, hit your head and get killed.  Does that mean that you should not shower again?  Nevertheless always keep in mind that statistics do not mean anything if you happen to be on the wrong side of them.  Even if a procedure has a 1 (one) in a 1,000,000 (million) chance of going wrong, it you happen to be that one..Also, keep in mind that by statistics, you have more of a chance of having something go wrong when taking medications.  Who should not have this procedure? If you are on a blood thinning medication (e.g. Coumadin, Plavix, see list of "Blood Thinners"), or if you have an active infection going on, you should not have the procedure.  If you are taking any blood thinners, please inform your physician.  How should I prepare for this procedure?  Do not eat or drink anything at least six hours prior to the procedure.  Bring a driver with you .  It cannot be a taxi.  Come accompanied by an adult that can drive you back, and that is strong enough to help you if your legs get weak or numb from the local anesthetic.  Take all of your medicines the morning of the procedure with just enough water to swallow them.  If you have diabetes, make sure that you are scheduled to have your procedure done first thing in the morning, whenever possible.  If you have diabetes, take only half of your insulin dose and notify our nurse that you have done so as soon  as you arrive at the clinic.  If you are diabetic, but only take blood sugar pills (oral hypoglycemic), then do not take them on the morning of your procedure.  You may take them after you have had the procedure.  Do not take aspirin or any aspirin-containing medications, at least eleven (11) days prior to the procedure.  They may prolong bleeding.  Wear loose fitting clothing that may be easy to take off and that you would not mind if it got stained with Betadine or blood.  Do not wear any jewelry or perfume  Remove any nail coloring.  It will interfere with some of our monitoring equipment.  NOTE: Remember that this is not meant to be interpreted as a complete list of all possible complications.  Unforeseen problems may occur.  BLOOD THINNERS  The following drugs contain aspirin or other products, which can cause increased bleeding during surgery and should not be taken for 2 weeks prior to and 1 week after surgery.  If you should need take something for relief of minor pain, you may take acetaminophen which is found in Tylenol,m Datril, Anacin-3 and Panadol. It is not blood thinner. The products listed below are.  Do not take any of the products listed below in addition to any listed on your instruction sheet.  A.P.C or A.P.C with Codeine Codeine Phosphate Capsules #3 Ibuprofen Ridaura  ABC compound Congesprin Imuran rimadil  Advil Cope Indocin Robaxisal  Alka-Seltzer Effervescent Pain Reliever and Antacid Coricidin or Coricidin-D  Indomethacin Rufen  Alka-Seltzer plus Cold Medicine Cosprin Ketoprofen S-A-C Tablets  Anacin Analgesic Tablets or Capsules Coumadin Korlgesic Salflex  Anacin Extra Strength Analgesic tablets or capsules CP-2 Tablets Lanoril Salicylate  Anaprox Cuprimine Capsules Levenox Salocol  Anexsia-D Dalteparin Magan Salsalate  Anodynos Darvon compound Magnesium Salicylate Sine-off  Ansaid Dasin Capsules Magsal Sodium Salicylate  Anturane Depen Capsules Marnal Soma   APF Arthritis pain formula Dewitt's Pills Measurin Stanback  Argesic Dia-Gesic Meclofenamic Sulfinpyrazone  Arthritis Bayer Timed Release Aspirin Diclofenac Meclomen Sulindac  Arthritis pain formula Anacin Dicumarol Medipren Supac  Analgesic (Safety coated) Arthralgen Diffunasal Mefanamic Suprofen  Arthritis Strength Bufferin Dihydrocodeine Mepro Compound Suprol  Arthropan liquid Dopirydamole Methcarbomol with Aspirin Synalgos  ASA tablets/Enseals Disalcid Micrainin Tagament  Ascriptin Doan's Midol Talwin  Ascriptin A/D Dolene Mobidin Tanderil  Ascriptin Extra Strength Dolobid Moblgesic Ticlid  Ascriptin with Codeine Doloprin or Doloprin with Codeine Momentum Tolectin  Asperbuf Duoprin Mono-gesic Trendar  Aspergum Duradyne Motrin or Motrin IB Triminicin  Aspirin plain, buffered or enteric coated Durasal Myochrisine Trigesic  Aspirin Suppositories Easprin Nalfon Trillsate  Aspirin with Codeine Ecotrin Regular or Extra Strength Naprosyn Uracel  Atromid-S Efficin Naproxen Ursinus  Auranofin Capsules Elmiron Neocylate Vanquish  Axotal Emagrin Norgesic Verin  Azathioprine Empirin or Empirin with Codeine Normiflo Vitamin E  Azolid Emprazil Nuprin Voltaren  Bayer Aspirin plain, buffered or children's or timed BC Tablets or powders Encaprin Orgaran Warfarin Sodium  Buff-a-Comp Enoxaparin Orudis Zorpin  Buff-a-Comp with Codeine Equegesic Os-Cal-Gesic   Buffaprin Excedrin plain, buffered or Extra Strength Oxalid   Bufferin Arthritis Strength Feldene Oxphenbutazone   Bufferin plain or Extra Strength Feldene Capsules Oxycodone with Aspirin   Bufferin with Codeine Fenoprofen Fenoprofen Pabalate or Pabalate-SF   Buffets II Flogesic Panagesic   Buffinol plain or Extra Strength Florinal or Florinal with Codeine Panwarfarin   Buf-Tabs Flurbiprofen Penicillamine   Butalbital Compound Four-way cold tablets Penicillin   Butazolidin Fragmin Pepto-Bismol   Carbenicillin Geminisyn Percodan   Carna  Arthritis Reliever Geopen Persantine   Carprofen Gold's salt Persistin   Chloramphenicol Goody's Phenylbutazone   Chloromycetin Haltrain Piroxlcam   Clmetidine heparin Plaquenil   Cllnoril Hyco-pap Ponstel   Clofibrate Hydroxy chloroquine Propoxyphen         Before stopping any of these medications, be sure to consult the physician who ordered them.  Some, such as Coumadin (Warfarin) are ordered to prevent or treat serious conditions such as "deep thrombosis", "pumonary embolisms", and other heart problems.  The amount of time that you may need off of the medication may also vary with the medication and the reason for which you were taking it.  If you are taking any of these medications, please make sure you notify your pain physician before you undergo any procedures.         Sacroiliac (SI) Joint  Injection Patient Information  Description: The sacroiliac joint connects the scrum (very low back and tailbone) to the ilium (a pelvic bone which also forms half of the hip joint).  Normally this joint experiences very little motion.  When this joint becomes inflamed or unstable low back and or hip and pelvis pain may result.  Injection of this joint with local anesthetics (numbing medicines) and steroids can provide diagnostic information and reduce pain.  This injection is performed with the aid of x-ray guidance into the tailbone area while you are lying on your stomach.   You may experience an electrical sensation down the leg while this is being done.  You may also experience numbness.  We also may ask if we are reproducing your normal pain during the injection.  Conditions which may be treated SI injection:   Low back, buttock, hip or leg pain  Preparation for the Injection:  1. Do not eat any solid food or dairy products within 6 hours of your appointment.  2. You may drink clear liquids up to 2 hours before appointment.  Clear liquids include water, black coffee, juice or soda.  No  milk or cream please. 3. You may take your regular medications, including pain medications with a sip of water before your appointment.  Diabetics should hold regular insulin (if take separately) and take 1/2 normal NPH dose the morning of the procedure.  Carry some sugar containing items with you to your appointment. 4. A driver must accompany you and be prepared to drive you home after your procedure. 5. Bring all of your current medications with you. 6. An IV may be inserted and sedation may be given at the discretion of the physician. 7. A blood pressure cuff, EKG and other monitors will often be applied during the procedure.  Some patients may need to have extra oxygen administered for a short period.  8. You will be asked to provide medical information, including your allergies, prior to the procedure.  We must know immediately if you are taking blood thinners (like Coumadin/Warfarin) or if you are allergic to IV iodine contrast (dye).  We must know if you could possible be pregnant.  Possible side effects:   Bleeding from needle site  Infection (rare, may require surgery)  Nerve injury (rare)  Numbness & tingling (temporary)  A brief convulsion or seizure  Light-headedness (temporary)  Pain at injection site (several days)  Decreased blood pressure (temporary)  Weakness in the leg (temporary)   Call if you experience:   New onset weakness or numbness of an extremity below the injection site that last more than 8 hours.  Hives or difficulty breathing ( go to the emergency room)  Inflammation or drainage at the injection site  Any new symptoms which are concerning to you  Please note:  Although the local anesthetic injected can often make your back/ hip/ buttock/ leg feel good for several hours after the injections, the pain will likely return.  It takes 3-7 days for steroids to work in the sacroiliac area.  You may not notice any pain relief for at least that one  week.  If effective, we will often do a series of three injections spaced 3-6 weeks apart to maximally decrease your pain.  After the initial series, we generally will wait some months before a repeat injection of the same type.  If you have any questions, please call [phone] [facility]

## 2015-07-11 NOTE — Progress Notes (Signed)
   Subjective:    Patient ID: Susan Houston, female    DOB: 1961-02-11, 54 y.o.   MRN: [id number]  HPI  Patient 54 year old female returns to [facility] for further evaluation and treatment of pain involving the lower back and lower extremity region. Patient states that she has had improvement with prior interventional treatment [facility]. Patient also states that she is tolerating the [place] fairly well. We discussed patient's condition and will be requested Dr. Kasandra Knudsen provide [place] with documentation stating that patient appears to be appropriate candidate for continuing Nucynta and that patient may be considered for an increase of the Nucynta. We'll also proceed with block of nerves to the sacroiliac joint at time return appointment since patient had greater than 50% relief of pain following the previous procedure. We remain available to consider additional modification of treatment as discussed. The patient was in agreement with suggested treatment plan.     Review of Systems     Objective:   Physical Exam  There was mild tenderness of the splenius capitis and occipitalis musculature region. There was minimal tenderness of the acromioclavicular and glenohumeral joint regions. Palpation of the region of the cervical facet cervical paraspinal musculature region reproduced mild discomfort. There was mild tends to palpation over the thoracic facet thoracic paraspinal musculature region as well. Tinel and Phalen's maneuver were without increase of pain of significant degree. Patient appeared to be with bilaterally equal grip strength. Palpation over the thoracic facet thoracic paraspinal musculature region reproduced pain of moderate degree in the lower thoracic region with no crepitus of the thoracic region noted. Palpation over the lumbar paraspinal musculature region lumbar facet region associated with moderately severe discomfort. Lateral bending rotation and  extension and palpation of the lumbar facets reproduce moderately severe discomfort. Straight leg raising was tolerates approximately 20 without increased pain with dorsiflexion noted. There appeared to be negative clonus negative Homans. DTRs were difficult to elicit patient had difficulty relaxing. There was moderately severe tenderness to palpation of the PSIS and PII S regions as well as the gluteal and piriformis musculature regions. There was mild tenderness of the greater trochanteric region and iliotibial band region. EHL strength appeared to be decreased. No definite sensory deficit of dermatomal despite detected. There was negative clonus negative Homans. Abdomen was nontender and no costovertebral angle tenderness was noted.      Assessment & Plan:    Degenerative disc disease lumbar spine Degenerative changes L4-5 disc desiccation, circumferential bulging and focal protrusion in the left neural foramen to extraforaminal region with facet and ligamentous hypertrophy, L5-S1 facet degeneration with no disc abnormalities.  Lumbar facet syndrome  Sacroiliac joint dysfunction    PLAN   Continue present medication Nucynta  Block of nerves to the sacroiliac joint to be performed at time of return appointment  F/U PCP Dr. Jeananne Rama for evaliation of  BP and general medical  condition  F/U surgical evaluation. May consider pending follow-up evaluations  F/U neurological evaluation. May consider pending follow-up evaluations  F/U evaluation with Dr.H Su As planned. We will also request of Dr. Kasandra Knudsen provide [place] with documentation stating that patient appears to be appropriate candidate to continue to receive Nucynta and may be considered for an increase of Nucynta.   May consider radiofrequency rhizolysis or intraspinal procedures pending response to present treatment and F/U evaluation   Patient to call [facility] should patient have concerns prior to scheduled return  appointment.

## 2015-07-16 ENCOUNTER — Inpatient Hospital Stay: Payer: Medicare Other | Admitting: Oncology

## 2015-07-16 ENCOUNTER — Inpatient Hospital Stay: Payer: Medicare Other | Attending: Oncology

## 2015-07-22 ENCOUNTER — Encounter: Payer: Self-pay | Admitting: Pain Medicine

## 2015-07-22 ENCOUNTER — Ambulatory Visit: Payer: Medicare Other | Attending: Pain Medicine | Admitting: Pain Medicine

## 2015-07-22 VITALS — BP 125/60 | HR 58 | Temp 97.9°F | Resp 17 | Ht 62.0 in | Wt 188.0 lb

## 2015-07-22 DIAGNOSIS — M533 Sacrococcygeal disorders, not elsewhere classified: Secondary | ICD-10-CM

## 2015-07-22 DIAGNOSIS — M47817 Spondylosis without myelopathy or radiculopathy, lumbosacral region: Secondary | ICD-10-CM | POA: Diagnosis not present

## 2015-07-22 DIAGNOSIS — M5126 Other intervertebral disc displacement, lumbar region: Secondary | ICD-10-CM | POA: Diagnosis not present

## 2015-07-22 DIAGNOSIS — M79604 Pain in right leg: Secondary | ICD-10-CM | POA: Diagnosis not present

## 2015-07-22 DIAGNOSIS — M79605 Pain in left leg: Secondary | ICD-10-CM | POA: Diagnosis not present

## 2015-07-22 DIAGNOSIS — M5137 Other intervertebral disc degeneration, lumbosacral region: Secondary | ICD-10-CM

## 2015-07-22 DIAGNOSIS — M545 Low back pain: Secondary | ICD-10-CM | POA: Diagnosis not present

## 2015-07-22 DIAGNOSIS — M5136 Other intervertebral disc degeneration, lumbar region: Secondary | ICD-10-CM | POA: Insufficient documentation

## 2015-07-22 DIAGNOSIS — M47816 Spondylosis without myelopathy or radiculopathy, lumbar region: Secondary | ICD-10-CM | POA: Insufficient documentation

## 2015-07-22 MED ORDER — CEFAZOLIN SODIUM 1 G IJ SOLR
INTRAMUSCULAR | Status: AC
  Administered 2015-07-22: 1 g via INTRAVENOUS
  Filled 2015-07-22: qty 10

## 2015-07-22 MED ORDER — ORPHENADRINE CITRATE 30 MG/ML IJ SOLN
INTRAMUSCULAR | Status: AC
  Filled 2015-07-22: qty 2

## 2015-07-22 MED ORDER — BUPIVACAINE HCL (PF) 0.25 % IJ SOLN
INTRAMUSCULAR | Status: AC
  Administered 2015-07-22: 30 mL
  Filled 2015-07-22: qty 30

## 2015-07-22 MED ORDER — TRIAMCINOLONE ACETONIDE 40 MG/ML IJ SUSP
INTRAMUSCULAR | Status: AC
  Administered 2015-07-22: 10 mg
  Filled 2015-07-22: qty 1

## 2015-07-22 MED ORDER — CEFUROXIME AXETIL 250 MG PO TABS: 250.0000 mg | ORAL_TABLET | Freq: Two times a day (BID) | ORAL | Status: DC

## 2015-07-22 MED ORDER — FENTANYL CITRATE (PF) 100 MCG/2ML IJ SOLN
INTRAMUSCULAR | Status: AC
  Administered 2015-07-22: 100 ug via INTRAVENOUS
  Filled 2015-07-22: qty 2

## 2015-07-22 MED ORDER — MIDAZOLAM HCL 5 MG/5ML IJ SOLN
INTRAMUSCULAR | Status: AC
  Filled 2015-07-22: qty 5

## 2015-07-22 NOTE — Progress Notes (Signed)
Subjective:    Patient ID: Susan Houston, female    DOB: Jul 09, 1961, 54 y.o.   MRN: [id number]  HPI  PROCEDURE:  Block of nerves to the sacroiliac joint.   NOTE:  The patient is a 54 y.o. female who returns to the [facility] for further evaluation and treatment of pain involving the lower back and lower extremity region with pain in the region of the buttocks as well. Prior MRI studies reveal degenerative disc disease lumbar spine Degenerative changes L4-5 disc desiccation, circumferential bulging and focal protrusion in the left neural foramen to extraforaminal region with facet and ligamentous hypertrophy, L5-S1 facet degeneration with no disc abnormalities..  The patient is with severe pain with palpation over the PSIS and PII S regions and has positive Patrick's maneuvers.  There is concern regarding a significant component of the patient's pain being due to sacroiliac joint dysfunction The risks, benefits, expectations of the procedure have been discussed and explained to the patient who is understanding and willing to proceed with interventional treatment in attempt to decrease severity of patient's symptoms, minimize the risk of medication escalation and  hopefully retard the progression of the patient's symptoms. We will proceed with what is felt to be a medically necessary procedure, block of nerves to the sacroiliac joint.   DESCRIPTION OF PROCEDURE:  Block of nerves to the sacroiliac joint.   The patient was taken to the fluoroscopy suite. With the patient in the prone position with EKG, blood pressure, pulse and pulse oximetry monitoring, IV Versed, IV fentanyl conscious sedation, Betadine prep of proposed entry site was performed.   Block of nerves at the L5 vertebral body level.   With the patient in prone position, under fluoroscopic guidance, a 22 -gauge needle was inserted at the L5 vertebral body level on the left side. With 15 degrees oblique orientation a 22 -gauge  needle was inserted in the region known as Burton's eye or eye of the Scotty dog. Following documentation of needle placement in the area of Burton's eye or eye of the Scotty dog under fluoroscopic guidance, needle placement was then accomplished at the sacral ala level on the left side.   Needle placement at the sacral ala.   With the patient in prone position under fluoroscopic guidance with AP view of the lumbosacral spine, a 22 -gauge needle was inserted in the region known as the sacral ala on the left side. Following documentation of needle placement on the left side under fluoroscopic guidance needle placement was then accomplished at the S1 foramen level.   Needle placement at the S1 foramen level.   With the patient in prone position under fluoroscopic guidance with AP view of the lumbosacral spine and cephalad orientation, a 22 -gauge needle was inserted at the superior and lateral border of the S1 foramen on the left side. Following documentation of needle placement at the S1 foramen level on the left side, needle placement was then accomplished at the S2 foramen level on the left side.   Needle placement at the S2 foramen level.   With the patient in prone position with AP view of the lumbosacral spine with cephalad orientation, a 22 - gauge needle was inserted at the superior and lateral border of the S2 foramen under fluoroscopic guidance on the left side. Following needle placement at the L5 vertebral body level, sacral ala, S1 foramen and S2 foramen on the left side, needle placement was verified on lateral view under fluoroscopic guidance.  Following needle placement documentation on lateral view, each needle was injected with 1 mL of 0.25% bupivacaine and Kenalog.   BLOCK OF THE NERVES TO SACROILIAC JOINT ON THE RIGHT SIDE The procedure was performed on the right side at the same levels as was performed on the left side and utilizing the same technique as on the left side and was  performed under fluoroscopic guidance as on the left side   A total of '10mg'$  of Kenalog was utilized for the procedure.   PLAN:  1. Medications: The patient will continue presently prescribed medication Nucynta.  2. The patient will be considered for modification of treatment regimen pending response to the procedure performed on today's visit.  3. The patient is to follow-up with primary care physician Dr. Jeananne Rama for evaluation of blood pressure and general medical condition following the procedure performed on today's visit.  4. Surgical evaluation as discussed.  5. Neurological evaluation as discussed. Follow-up evaluation with Dr.Su as discussed and as planned  6. The patient may be a candidate for radiofrequency procedures, implantation devices and other treatment pending response to treatment performed on today's visit and follow-up evaluation.  7. The patient has been advised to adhere to proper body mechanics and to avoid activities which may exacerbate the patient's symptoms.   Return appointment to [facility] as scheduled.    Review of Systems     Objective:   Physical Exam        Assessment & Plan:

## 2015-07-22 NOTE — Patient Instructions (Addendum)
PLAN   Continue present medication Nucynta and begin antibiotic Ceftin as prescribed. Please give you Ceftin antibiotic today   F/U PCP Dr. Jeananne Rama for evaliation of  BP and general medical  condition  F/U surgical evaluation. May consider pending follow-up evaluations  F/U Dr.Su as planned   F/U neurological evaluation. May consider pending follow-up evaluations  May consider radiofrequency rhizolysis or intraspinal procedures pending response to present treatment and F/U evaluation   Patient to call Pain Management Center should patient have concerns prior to scheduled return appointment. Pain Management Discharge Instructions  General Discharge Instructions :  If you need to reach your doctor call: Monday-Friday 8:00 am - 4:00 pm at [phone] or toll free [phone].  After clinic hours [phone] to have operator reach doctor.  Bring all of your medication bottles to all your appointments in the pain clinic.  To cancel or reschedule your appointment with Pain Management please remember to call 24 hours in advance to avoid a fee.  Refer to the educational materials which you have been given on: General Risks, I had my Procedure. Discharge Instructions, Post Sedation.  Post Procedure Instructions:  The drugs you were given will stay in your system until tomorrow, so for the next 24 hours you should not drive, make any legal decisions or drink any alcoholic beverages.  You may eat anything you prefer, but it is better to start with liquids then soups and crackers, and gradually work up to solid foods.  Please notify your doctor immediately if you have any unusual bleeding, trouble breathing or pain that is not related to your normal pain.  Depending on the type of procedure that was done, some parts of your body may feel week and/or numb.  This usually clears up by tonight or the next day.  Walk with the use of an assistive device or accompanied by an adult for the 24  hours.  You may use ice on the affected area for the first 24 hours.  Put ice in a Ziploc bag and cover with a towel and place against area 15 minutes on 15 minutes off.  You may switch to heat after 24 hours.GENERAL RISKS AND COMPLICATIONS  What are the risk, side effects and possible complications? Generally speaking, most procedures are safe.  However, with any procedure there are risks, side effects, and the possibility of complications.  The risks and complications are dependent upon the sites that are lesioned, or the type of nerve block to be performed.  The closer the procedure is to the spine, the more serious the risks are.  Great care is taken when placing the radio frequency needles, block needles or lesioning probes, but sometimes complications can occur. 1. Infection: Any time there is an injection through the skin, there is a risk of infection.  This is why sterile conditions are used for these blocks.  There are four possible types of infection. 1. Localized skin infection. 2. Central Nervous System Infection-This can be in the form of Meningitis, which can be deadly. 3. Epidural Infections-This can be in the form of an epidural abscess, which can cause pressure inside of the spine, causing compression of the spinal cord with subsequent paralysis. This would require an emergency surgery to decompress, and there are no guarantees that the patient would recover from the paralysis. 4. Discitis-This is an infection of the intervertebral discs.  It occurs in about 1% of discography procedures.  It is difficult to treat and it may lead to surgery.  2. Pain: the needles have to go through skin and soft tissues, will cause soreness.       3. Damage to internal structures:  The nerves to be lesioned may be near blood vessels or    other nerves which can be potentially damaged.       4. Bleeding: Bleeding is more common if the patient is taking blood thinners such as  aspirin, Coumadin,  Ticiid, Plavix, etc., or if he/she have some genetic predisposition  such as hemophilia. Bleeding into the spinal canal can cause compression of the spinal  cord with subsequent paralysis.  This would require an emergency surgery to  decompress and there are no guarantees that the patient would recover from the  paralysis.       5. Pneumothorax:  Puncturing of a lung is a possibility, every time a needle is introduced in  the area of the chest or upper back.  Pneumothorax refers to free air around the  collapsed lung(s), inside of the thoracic cavity (chest cavity).  Another two possible  complications related to a similar event would include: Hemothorax and Chylothorax.   These are variations of the Pneumothorax, where instead of air around the collapsed  lung(s), you may have blood or chyle, respectively.       6. Spinal headaches: They may occur with any procedures in the area of the spine.       7. Persistent CSF (Cerebro-Spinal Fluid) leakage: This is a rare problem, but may occur  with prolonged intrathecal or epidural catheters either due to the formation of a fistulous  track or a dural tear.       8. Nerve damage: By working so close to the spinal cord, there is always a possibility of  nerve damage, which could be as serious as a permanent spinal cord injury with  paralysis.       9. Death:  Although rare, severe deadly allergic reactions known as "Anaphylactic  reaction" can occur to any of the medications used.      10. Worsening of the symptoms:  We can always make thing worse.  What are the chances of something like this happening? Chances of any of this occuring are extremely low.  By statistics, you have more of a chance of getting killed in a motor vehicle accident: while driving to the hospital than any of the above occurring .  Nevertheless, you should be aware that they are possibilities.  In general, it is similar to taking a shower.  Everybody knows that you can slip, hit your head and  get killed.  Does that mean that you should not shower again?  Nevertheless always keep in mind that statistics do not mean anything if you happen to be on the wrong side of them.  Even if a procedure has a 1 (one) in a 1,000,000 (million) chance of going wrong, it you happen to be that one..Also, keep in mind that by statistics, you have more of a chance of having something go wrong when taking medications.  Who should not have this procedure? If you are on a blood thinning medication (e.g. Coumadin, Plavix, see list of "Blood Thinners"), or if you have an active infection going on, you should not have the procedure.  If you are taking any blood thinners, please inform your physician.  How should I prepare for this procedure?  Do not eat or drink anything at least six hours prior to the procedure.  Bring a driver  with you .  It cannot be a taxi.  Come accompanied by an adult that can drive you back, and that is strong enough to help you if your legs get weak or numb from the local anesthetic.  Take all of your medicines the morning of the procedure with just enough water to swallow them.  If you have diabetes, make sure that you are scheduled to have your procedure done first thing in the morning, whenever possible.  If you have diabetes, take only half of your insulin dose and notify our nurse that you have done so as soon as you arrive at the clinic.  If you are diabetic, but only take blood sugar pills (oral hypoglycemic), then do not take them on the morning of your procedure.  You may take them after you have had the procedure.  Do not take aspirin or any aspirin-containing medications, at least eleven (11) days prior to the procedure.  They may prolong bleeding.  Wear loose fitting clothing that may be easy to take off and that you would not mind if it got stained with Betadine or blood.  Do not wear any jewelry or perfume  Remove any nail coloring.  It will interfere with some of  our monitoring equipment.  NOTE: Remember that this is not meant to be interpreted as a complete list of all possible complications.  Unforeseen problems may occur.  BLOOD THINNERS The following drugs contain aspirin or other products, which can cause increased bleeding during surgery and should not be taken for 2 weeks prior to and 1 week after surgery.  If you should need take something for relief of minor pain, you may take acetaminophen which is found in Tylenol,m Datril, Anacin-3 and Panadol. It is not blood thinner. The products listed below are.  Do not take any of the products listed below in addition to any listed on your instruction sheet.  A.P.C or A.P.C with Codeine Codeine Phosphate Capsules #3 Ibuprofen Ridaura  ABC compound Congesprin Imuran rimadil  Advil Cope Indocin Robaxisal  Alka-Seltzer Effervescent Pain Reliever and Antacid Coricidin or Coricidin-D  Indomethacin Rufen  Alka-Seltzer plus Cold Medicine Cosprin Ketoprofen S-A-C Tablets  Anacin Analgesic Tablets or Capsules Coumadin Korlgesic Salflex  Anacin Extra Strength Analgesic tablets or capsules CP-2 Tablets Lanoril Salicylate  Anaprox Cuprimine Capsules Levenox Salocol  Anexsia-D Dalteparin Magan Salsalate  Anodynos Darvon compound Magnesium Salicylate Sine-off  Ansaid Dasin Capsules Magsal Sodium Salicylate  Anturane Depen Capsules Marnal Soma  APF Arthritis pain formula Dewitt's Pills Measurin Stanback  Argesic Dia-Gesic Meclofenamic Sulfinpyrazone  Arthritis Bayer Timed Release Aspirin Diclofenac Meclomen Sulindac  Arthritis pain formula Anacin Dicumarol Medipren Supac  Analgesic (Safety coated) Arthralgen Diffunasal Mefanamic Suprofen  Arthritis Strength Bufferin Dihydrocodeine Mepro Compound Suprol  Arthropan liquid Dopirydamole Methcarbomol with Aspirin Synalgos  ASA tablets/Enseals Disalcid Micrainin Tagament  Ascriptin Doan's Midol Talwin  Ascriptin A/D Dolene Mobidin Tanderil  Ascriptin Extra Strength  Dolobid Moblgesic Ticlid  Ascriptin with Codeine Doloprin or Doloprin with Codeine Momentum Tolectin  Asperbuf Duoprin Mono-gesic Trendar  Aspergum Duradyne Motrin or Motrin IB Triminicin  Aspirin plain, buffered or enteric coated Durasal Myochrisine Trigesic  Aspirin Suppositories Easprin Nalfon Trillsate  Aspirin with Codeine Ecotrin Regular or Extra Strength Naprosyn Uracel  Atromid-S Efficin Naproxen Ursinus  Auranofin Capsules Elmiron Neocylate Vanquish  Axotal Emagrin Norgesic Verin  Azathioprine Empirin or Empirin with Codeine Normiflo Vitamin E  Azolid Emprazil Nuprin Voltaren  Bayer Aspirin plain, buffered or children's or timed BC Tablets or powders  Encaprin Orgaran Warfarin Sodium  Buff-a-Comp Enoxaparin Orudis Zorpin  Buff-a-Comp with Codeine Equegesic Os-Cal-Gesic   Buffaprin Excedrin plain, buffered or Extra Strength Oxalid   Bufferin Arthritis Strength Feldene Oxphenbutazone   Bufferin plain or Extra Strength Feldene Capsules Oxycodone with Aspirin   Bufferin with Codeine Fenoprofen Fenoprofen Pabalate or Pabalate-SF   Buffets II Flogesic Panagesic   Buffinol plain or Extra Strength Florinal or Florinal with Codeine Panwarfarin   Buf-Tabs Flurbiprofen Penicillamine   Butalbital Compound Four-way cold tablets Penicillin   Butazolidin Fragmin Pepto-Bismol   Carbenicillin Geminisyn Percodan   Carna Arthritis Reliever Geopen Persantine   Carprofen Gold's salt Persistin   Chloramphenicol Goody's Phenylbutazone   Chloromycetin Haltrain Piroxlcam   Clmetidine heparin Plaquenil   Cllnoril Hyco-pap Ponstel   Clofibrate Hydroxy chloroquine Propoxyphen         Before stopping any of these medications, be sure to consult the physician who ordered them.  Some, such as Coumadin (Warfarin) are ordered to prevent or treat serious conditions such as "deep thrombosis", "pumonary embolisms", and other heart problems.  The amount of time that you may need off of the medication may also  vary with the medication and the reason for which you were taking it.  If you are taking any of these medications, please make sure you notify your pain physician before you undergo any procedures.  Ceftin to be picked up at pharmacy

## 2015-07-22 NOTE — Progress Notes (Signed)
Safety precautions to be maintained throughout the outpatient stay will include: orient to surroundings, keep bed in low position, maintain call bell within reach at all times, provide assistance with transfer out of bed and ambulation.  

## 2015-07-23 ENCOUNTER — Telehealth: Payer: Self-pay | Admitting: *Deleted

## 2015-07-23 NOTE — Telephone Encounter (Signed)
Left voice mail

## 2015-07-24 ENCOUNTER — Inpatient Hospital Stay: Payer: Medicare Other | Admitting: Oncology

## 2015-07-24 ENCOUNTER — Inpatient Hospital Stay: Payer: Medicare Other | Attending: Oncology

## 2015-07-26 ENCOUNTER — Ambulatory Visit: Payer: Self-pay | Admitting: Family Medicine

## 2015-08-07 ENCOUNTER — Ambulatory Visit: Payer: Medicare Other | Attending: Pain Medicine | Admitting: Pain Medicine

## 2015-08-07 ENCOUNTER — Encounter: Payer: Self-pay | Admitting: Pain Medicine

## 2015-08-07 VITALS — BP 121/58 | HR 63 | Temp 97.7°F | Resp 20 | Ht 62.0 in | Wt 180.0 lb

## 2015-08-07 DIAGNOSIS — M79604 Pain in right leg: Secondary | ICD-10-CM | POA: Diagnosis present

## 2015-08-07 DIAGNOSIS — M47817 Spondylosis without myelopathy or radiculopathy, lumbosacral region: Secondary | ICD-10-CM | POA: Diagnosis not present

## 2015-08-07 DIAGNOSIS — M533 Sacrococcygeal disorders, not elsewhere classified: Secondary | ICD-10-CM

## 2015-08-07 DIAGNOSIS — M79605 Pain in left leg: Secondary | ICD-10-CM | POA: Diagnosis present

## 2015-08-07 DIAGNOSIS — M545 Low back pain: Secondary | ICD-10-CM | POA: Diagnosis present

## 2015-08-07 DIAGNOSIS — M5136 Other intervertebral disc degeneration, lumbar region: Secondary | ICD-10-CM | POA: Diagnosis not present

## 2015-08-07 DIAGNOSIS — M47816 Spondylosis without myelopathy or radiculopathy, lumbar region: Secondary | ICD-10-CM | POA: Diagnosis not present

## 2015-08-07 DIAGNOSIS — M5137 Other intervertebral disc degeneration, lumbosacral region: Secondary | ICD-10-CM

## 2015-08-07 MED ORDER — CEFAZOLIN SODIUM 1-5 GM-% IV SOLN: 1.0000 g | Freq: Once | INTRAVENOUS | Status: DC

## 2015-08-07 MED ORDER — BUPIVACAINE HCL (PF) 0.25 % IJ SOLN: 30.0000 mL | Freq: Once | INTRAMUSCULAR | Status: DC

## 2015-08-07 MED ORDER — MIDAZOLAM HCL 5 MG/5ML IJ SOLN
INTRAMUSCULAR | Status: AC
  Administered 2015-08-07: 3 mg via INTRAVENOUS
  Filled 2015-08-07: qty 5

## 2015-08-07 MED ORDER — MIDAZOLAM HCL 5 MG/5ML IJ SOLN
5.0000 mg | Freq: Once | INTRAMUSCULAR | Status: AC
  Administered 2015-08-07: 3 mg via INTRAVENOUS

## 2015-08-07 MED ORDER — CEFUROXIME AXETIL 250 MG PO TABS: 250.0000 mg | ORAL_TABLET | Freq: Two times a day (BID) | ORAL | Status: DC

## 2015-08-07 MED ORDER — ORPHENADRINE CITRATE 30 MG/ML IJ SOLN: 60.0000 mg | Freq: Once | INTRAMUSCULAR | Status: DC

## 2015-08-07 MED ORDER — FENTANYL CITRATE (PF) 100 MCG/2ML IJ SOLN
INTRAMUSCULAR | Status: AC
  Administered 2015-08-07: 100 ug via INTRAVENOUS
  Filled 2015-08-07: qty 2

## 2015-08-07 MED ORDER — LIDOCAINE HCL (PF) 1 % IJ SOLN
INTRAMUSCULAR | Status: AC
  Filled 2015-08-07: qty 10

## 2015-08-07 MED ORDER — LIDOCAINE HCL (PF) 1 % IJ SOLN
10.0000 mL | Freq: Once | INTRAMUSCULAR | Status: AC
  Administered 2015-08-07: 09:00:00 via SUBCUTANEOUS

## 2015-08-07 MED ORDER — LACTATED RINGERS IV SOLN: 1000.0000 mL | INTRAVENOUS | Status: DC

## 2015-08-07 MED ORDER — TAPENTADOL HCL 50 MG PO TABS: ORAL_TABLET | ORAL | Status: DC

## 2015-08-07 MED ORDER — FENTANYL CITRATE (PF) 100 MCG/2ML IJ SOLN
100.0000 ug | Freq: Once | INTRAMUSCULAR | Status: AC
  Administered 2015-08-07: 100 ug via INTRAVENOUS

## 2015-08-07 MED ORDER — SODIUM CHLORIDE 0.9 % IJ SOLN: 20.0000 mL | Freq: Once | INTRAMUSCULAR | Status: DC

## 2015-08-07 NOTE — Patient Instructions (Addendum)
PLAN  Continue present medication Nucynta and begin taking antibiotic Ceftin as prescribed. Please obtain your antibiotic today and begin taking antibiotic today  F/U PCP Dr. Jeananne Rama for evaliation of  BP and general medical  condition. Please see Dr. Jeananne Rama this week as we discussed for evaluation of blood pressure which is low  F/U surgical evaluation. May consider pending follow-up evaluation  F/U Dr.Su as planned   F/U neurological evaluation. May consider pending follow-up evaluations  May consider radiofrequency rhizolysis or intraspinal procedures pending response to present treatment and F/U evaluation.  Patient to call Pain Management Center should patient have concerns prior to scheduled return appointment.   Pain Management Discharge Instructions  General Discharge Instructions :  If you need to reach your doctor call: Monday-Friday 8:00 am - 4:00 pm at [phone] or toll free [phone].  After clinic hours [phone] to have operator reach doctor.  Bring all of your medication bottles to all your appointments in the pain clinic.  To cancel or reschedule your appointment with Pain Management please remember to call 24 hours in advance to avoid a fee.  Refer to the educational materials which you have been given on: General Risks, I had my Procedure. Discharge Instructions, Post Sedation.  Post Procedure Instructions:  The drugs you were given will stay in your system until tomorrow, so for the next 24 hours you should not drive, make any legal decisions or drink any alcoholic beverages.  You may eat anything you prefer, but it is better to start with liquids then soups and crackers, and gradually work up to solid foods.  Please notify your doctor immediately if you have any unusual bleeding, trouble breathing or pain that is not related to your normal pain.  Depending on the type of procedure that was done, some parts of your body may feel week and/or numb.   This usually clears up by tonight or the next day.  Walk with the use of an assistive device or accompanied by an adult for the 24 hours.  You may use ice on the affected area for the first 24 hours.  Put ice in a Ziploc bag and cover with a towel and place against area 15 minutes on 15 minutes off.  You may switch to heat after 24 hours.  A prescription for CEFTIN was sent to your pharmacy and should be available for pickup today. A prescription for NUCYNTA was given to you today.

## 2015-08-07 NOTE — Progress Notes (Signed)
Subjective:    Patient ID: Susan Houston, female    DOB: 15-Mar-1961, 54 y.o.   MRN: [id number]  HPI  PROCEDURE PERFORMED: Radiofrequency rhizolysis (lunbar facet medial branch nerve radiofrequency rhizolysis).  The procedure was performed with the COOLIEF technique.  COOLIEF technique was performed using the Synergy cooled radiofrequency probe [id number], the synergy cooled radiofrequency introducer [id number], and the synergy cooled sterile tube kit [id number].   HISTORY: The patient is a 54 y.o. female who returns to the [facility] for further evaluation of severely disabling pain involving the lumbar and lower extremity region.  MRI revealed degenerative disc disease lumbar spine with degenerative changes L4-5 disc desiccation, circumferential bulging and focal protrusion in the left neural foramen to extraforaminal region with facet and ligamentous hypertrophy, L5-S1 facet degeneration with no disc abnormalities.   There is concern regarding significant component of patient's pain being due to facet arthropathy with facet syndrome. The patient has had significant relief of pain following previous lumbar facet, medial branch nerve, blocks. Radiofrequency rhizolysis of the lumbar facets, medial branch nerves, will be performed at this time an attempt to provide longer lasting relief of the patient's pain, minimize progression of the patient's symptoms, and avoid the need for more involved treatment. The risks, benefits and expectations of the procedure have been discussed and explained to the patient who was with understanding and wished to proceed with interventional treatment in an attempt to decrease severely  disabling pain of the lumbar and lower extremity region.  We will proceed with what is felt to be a medically necessary procedure, radiofrequency rhizolysis (lumbar facet medial branch nerve radiofrequency rhizolysis) using the COOLIEF technique.  DESCRIPTION OF PROCEDURE: COOLIEF  Technique Radiofrequency Rhizolysis of Lumbar Facets (Medial Branch Nerves Radiofrequency Rhizolysis) with IV Versed, IV Fentanyl conscious sedation, EKG, blood pressure, pulse, and pulse oximetry monitoring.  The procedure was performed with the patient in the prone position under fluoroscopic guidance.  Following Betadine prep of the proposed entry site, a 1.5% lidocaine plain skin wheal of the proposed needle entry site was  prepared in oblique orientation.   Right, L3 right Radiofrequency Rhizolysis right Facet  (Medial Branch Nerve): Under fluoroscopic guidance, the radiofrequency needle was inserted at the L3 vertebral body level after a local anesthetic skin wheal of 1.5% lidocaine plain and Betadine prep of the proposed needle entry site was performed.  The needle was inserted under fluoroscopic guidance in the area known as Burton's eye or eye of the Scotty dog with needle placed at the superior and lateral border of the targeted area with oblique orientation utilizing the tunnel (gun  barrel approach) technique to the targeted area.  Right Radiofrequency Rhizolysis at right L4 and right L5  Lumbar Facets (Medial Branch Nerves) The procedure was performed at L4 and L5 exactly as was performed at the L3 and under fluoroscopic guidance utilizing the identical technique as utilized at the L3 vertebral body level.   Needle placement was then verified on lateral view and following needle placement verification on lateral view, radiofrequency testing was then carried out with motor testing at 2 Hz stimulation without evidence of stimulation of the lower extremity. Following radiofrequency testing for motor testing, each radiofrequency probe was then prepared with 2 mL of preservative-free lidocaine and following 1 minute of anesthetizing each proposed radiofrequency lesioning  Level, the radiofrequency probe was then inserted in the right, L5 vertebral body level needle with radiofrequency lesioning  carried out for 2-1/2 minutes with temperature of the  tissue being maintained at 80 degrees centigrade for 2-1/2 minutes.  Radiofrequency probe was then inserted in the right, L4 vertebral body level needle and radiofrequency lesioning was performed at 80 degrees centigrade tissue temperature for 2-1/2 minutes.  Radiofrequency probe was then inserted in the right L3 vertebral body level needle and radiofrequency lesioning was performed at 80 degrees centigrade tissue temperature for 2-1/2 minutes  The patient tolerated the procedure well.   PLAN: 1. Medications: The patient will continue the present medication Nucynta. 2. The patient will follow up with Dr. Jeananne Rama regarding blood pressure and general medical condition as discussed with the patient.  3. Surgical evaluation as discussed. 4. Neurological evaluation and follow-up evaluation with Dr. Kasandra Knudsen as discussed.  5. The patient has been advised to call the Pain Management Center prior to scheduled return appointment should there be significant change in the patient's condition or should the patient have other concerns regarding condition prior to scheduled return appointment.  The patient is with understanding and is in agreement with suggested treatment plan.    Review of Systems     Objective:   Physical Exam        Assessment & Plan:

## 2015-08-07 NOTE — Progress Notes (Signed)
Safety precautions to be maintained throughout the outpatient stay will include: orient to surroundings, keep bed in low position, maintain call bell within reach at all times, provide assistance with transfer out of bed and ambulation.  

## 2015-08-19 ENCOUNTER — Inpatient Hospital Stay: Payer: Medicare Other

## 2015-08-19 ENCOUNTER — Inpatient Hospital Stay: Payer: Medicare Other | Admitting: Oncology

## 2015-08-19 ENCOUNTER — Encounter: Payer: Self-pay | Admitting: *Deleted

## 2015-08-21 DIAGNOSIS — F331 Major depressive disorder, recurrent, moderate: Secondary | ICD-10-CM | POA: Diagnosis not present

## 2015-08-21 DIAGNOSIS — Z79899 Other long term (current) drug therapy: Secondary | ICD-10-CM | POA: Diagnosis not present

## 2015-08-21 DIAGNOSIS — F419 Anxiety disorder, unspecified: Secondary | ICD-10-CM | POA: Diagnosis not present

## 2015-09-05 ENCOUNTER — Ambulatory Visit: Payer: Medicare Other | Attending: Pain Medicine | Admitting: Pain Medicine

## 2015-09-05 ENCOUNTER — Encounter: Payer: Self-pay | Admitting: Pain Medicine

## 2015-09-05 VITALS — BP 113/53 | HR 63 | Temp 97.5°F | Resp 18 | Ht 62.0 in | Wt 180.0 lb

## 2015-09-05 DIAGNOSIS — M79605 Pain in left leg: Secondary | ICD-10-CM | POA: Diagnosis present

## 2015-09-05 DIAGNOSIS — M5126 Other intervertebral disc displacement, lumbar region: Secondary | ICD-10-CM | POA: Diagnosis not present

## 2015-09-05 DIAGNOSIS — M533 Sacrococcygeal disorders, not elsewhere classified: Secondary | ICD-10-CM | POA: Diagnosis not present

## 2015-09-05 DIAGNOSIS — Z5181 Encounter for therapeutic drug level monitoring: Secondary | ICD-10-CM | POA: Diagnosis not present

## 2015-09-05 DIAGNOSIS — M545 Low back pain: Secondary | ICD-10-CM | POA: Diagnosis present

## 2015-09-05 DIAGNOSIS — Z79899 Other long term (current) drug therapy: Secondary | ICD-10-CM | POA: Diagnosis not present

## 2015-09-05 DIAGNOSIS — F1120 Opioid dependence, uncomplicated: Secondary | ICD-10-CM | POA: Diagnosis not present

## 2015-09-05 DIAGNOSIS — M5136 Other intervertebral disc degeneration, lumbar region: Secondary | ICD-10-CM | POA: Diagnosis not present

## 2015-09-05 DIAGNOSIS — M47816 Spondylosis without myelopathy or radiculopathy, lumbar region: Secondary | ICD-10-CM | POA: Diagnosis not present

## 2015-09-05 DIAGNOSIS — M5137 Other intervertebral disc degeneration, lumbosacral region: Secondary | ICD-10-CM

## 2015-09-05 DIAGNOSIS — M791 Myalgia: Secondary | ICD-10-CM | POA: Diagnosis not present

## 2015-09-05 DIAGNOSIS — M79604 Pain in right leg: Secondary | ICD-10-CM | POA: Diagnosis present

## 2015-09-05 DIAGNOSIS — M47817 Spondylosis without myelopathy or radiculopathy, lumbosacral region: Secondary | ICD-10-CM | POA: Diagnosis not present

## 2015-09-05 DIAGNOSIS — Z0389 Encounter for observation for other suspected diseases and conditions ruled out: Secondary | ICD-10-CM | POA: Diagnosis not present

## 2015-09-05 DIAGNOSIS — M5416 Radiculopathy, lumbar region: Secondary | ICD-10-CM | POA: Diagnosis not present

## 2015-09-05 MED ORDER — TAPENTADOL HCL 50 MG PO TABS: ORAL_TABLET | ORAL | Status: DC

## 2015-09-05 NOTE — Patient Instructions (Addendum)
PLAN  Continue present medication Nucynta   Block of the nerves to the sacroiliac joint to be performed at time of return appointment  F/U PCP Dr. Jeananne Rama for evaliation of  BP and general medical  condition.   F/U surgical evaluation. May consider pending follow-up evaluation  F/U Dr.Su as planned   F/U neurological evaluation. May consider pending follow-up evaluations  Radiofrequency rhizolysis lumbar facet medial branch nerves to be performed pending insurance approval  Patient to call [facility] should patient have concerns prior to scheduled return appointment. Radiofrequency Lesioning Radiofrequency lesioning is a procedure that is performed to relieve pain. The procedure is often used for back, neck, or arm pain. Radiofrequency lesioning involves the use of a machine that creates radio waves to make heat. During the procedure, the heat is applied to the nerve that carries the pain signal. The heat damages the nerve and interferes with the pain signal. Pain relief usually lasts for 6 months to 1 year. LET [facility] CARE PROVIDER KNOW ABOUT:  Any allergies you have.  All medicines you are taking, including vitamins, herbs, eye drops, creams, and over-the-counter medicines.  Previous problems you or members of your family have had with the use of anesthetics.  Any blood disorders you have.  Previous surgeries you have had.  Any medical conditions you have.  Whether you are pregnant or may be pregnant. RISKS AND COMPLICATIONS Generally, this is a safe procedure. However, problems may occur, including:  Pain or soreness at the injection site.  Infection at the injection site.  Damage to nerves or blood vessels. BEFORE THE PROCEDURE  Ask your health care provider about:  Changing or stopping your regular medicines. This is especially important if you are taking diabetes medicines or blood thinners.  Taking medicines such as aspirin and ibuprofen. These  medicines can thin your blood. Do not take these medicines before your procedure if your health care provider instructs you not to.  Follow instructions from your health care provider about eating or drinking restrictions.  Plan to have someone take you home after the procedure.  If you go home right after the procedure, plan to have someone with you for 24 hours. PROCEDURE  You will be given one or more of the following:  A medicine to help you relax (sedative).  A medicine to numb the area (local anesthetic).  You will be awake during the procedure. You will need to be able to talk with the health care provider during the procedure.  With the help of a type of X-ray (fluoroscopy), the health care provider will insert a radiofrequency needle into the area to be treated.  Next, a wire that carries the radio waves (electrode) will be put through the radiofrequency needle. An electrical pulse will be sent through the electrode to verify the correct nerve. You will feel a tingling sensation, and you may have muscle twitching.  Then, the tissue that is around the needle tip will be heated by an electric current that is passed using the radiofrequency machine. This will numb the nerves.  A bandage (dressing) will be put on the insertion area after the procedure is done. The procedure may vary among health care providers and hospitals. AFTER THE PROCEDURE  Your blood pressure, heart rate, breathing rate, and blood oxygen level will be monitored often until the medicines you were given have worn off.  Return to your normal activities as directed by your health care provider.   This information is  not intended to replace advice given to you by your health care provider. Make sure you discuss any questions you have with your health care provider.   Document Released: 06/03/2011 Document Revised: 06/26/2015 Document Reviewed: 11/12/2014 Elsevier Interactive Patient Education 2016 [employer] Sacroiliac (SI) Joint Injection Patient Information  Description: The sacroiliac joint connects the scrum (very low back and tailbone) to the ilium (a pelvic bone which also forms half of the hip joint).  Normally this joint experiences very little motion.  When this joint becomes inflamed or unstable low back and or hip and pelvis pain may result.  Injection of this joint with local anesthetics (numbing medicines) and steroids can provide diagnostic information and reduce pain.  This injection is performed with the aid of x-ray guidance into the tailbone area while you are lying on your stomach.   You may experience an electrical sensation down the leg while this is being done.  You may also experience numbness.  We also may ask if we are reproducing your normal pain during the injection.  Conditions which may be treated SI injection:   Low back, buttock, hip or leg pain  Preparation for the Injection:   Do not eat any solid food or dairy products within 6 hours of your appointment.   You may drink clear liquids up to 2 hours before appointment.  Clear liquids include water, black coffee, juice or soda.  No milk or cream please.  You may take your regular medications, including pain medications with a sip of water before your appointment.  Diabetics should hold regular insulin (if take separately) and take 1/2 normal NPH dose the morning of the procedure.  Carry some sugar containing items with you to your appointment.  A driver must accompany you and be prepared to drive you home after your procedure.  Bring all of your current medications with you.  An IV may be inserted and sedation may be given at the discretion of the physician.  A blood pressure cuff, EKG and other monitors will often be applied during the procedure.  Some patients may need to have extra oxygen administered for a short period.   You will be asked to provide medical information, including your allergies, prior  to the procedure.  We must know immediately if you are taking blood thinners (like Coumadin/Warfarin) or if you are allergic to IV iodine contrast (dye).  We must know if you could possible be pregnant.  Possible side effects:   Bleeding from needle site  Infection (rare, may require surgery)  Nerve injury (rare)  Numbness & tingling (temporary)  A brief convulsion or seizure  Light-headedness (temporary)  Pain at injection site (several days)  Decreased blood pressure (temporary)  Weakness in the leg (temporary)   Call if you experience:   New onset weakness or numbness of an extremity below the injection site that last more than 8 hours.  Hives or difficulty breathing ( go to the emergency room)  Inflammation or drainage at the injection site  Any new symptoms which are concerning to you  Please note:  Although the local anesthetic injected can often make your back/ hip/ buttock/ leg feel good for several hours after the injections, the pain will likely return.  It takes 3-7 days for steroids to work in the sacroiliac area.  You may not notice any pain relief for at least that one week.  If effective, we will often do a series of three injections spaced 3-6 weeks  apart to maximally decrease your pain.  After the initial series, we generally will wait some months before a repeat injection of the same type.  If you have any questions, please call [phone] [facility]

## 2015-09-05 NOTE — Progress Notes (Signed)
   Subjective:    Patient ID: Susan Houston, female    DOB: 1961-06-20, 54 y.o.   MRN: [id number]  HPI  The patient is a 54 year old female who returns to pain management for further evaluation and treatment of pain involving the lower back and lower extremity region. The patient denies any recent trauma change in events of daily living to cause significant change in symptomatology. Patient states that she had improvement of her pain with previous radial freaks rhizolysis lumbar facet, medial branch nerve root of region rhizolysis. At the present time patient admits to some pain of the lower back region radiating to the buttocks on the left as well as on the right aggravated by standing walking and climbing stairs. There is concern regarding component of pain due to sacroiliac joint dysfunction. We will proceed with block of the nerves to the sacroiliac joint and will request insurance approval for radial freaks rhizolysis lumbar facets to be performed at additional levels as discussed and explained to patient on today's visit who was with understanding and agreed to suggested treatment plan. Patient will continue Nucynta ER as prescribed. Patient tolerating medication well without side effects and continues to follow-up with Dr. Kasandra Knudsen follow-up evaluations and assessment of patient's condition.      Review of Systems     Objective:   Physical Exam  There was tenderness to palpation of paraspinal muscular region cervical region cervical facet region. Palpation over the thoracic facet thoracic paraspinal muscles region was with mild to moderate discomfort. Patient appeared to be unremarkable Spurling's maneuver and Tinel and Phalen's maneuver were without increased pain. Palpation over the thoracic region was without crepitus of the thoracic region. Palpation over the lumbar paraspinal musculature and lumbar facet region was attends to palpation with increased pain with lateral bending and rotation  extension and palpation of the lumbar facets. There was moderate to moderately severe tenderness to palpation of the PSIS and P IIS region as well as the gluteal and piriformis musculature regions. Patient appeared to be with positive Patrick's maneuver. Patient greater trochanteric region and iliotibial band region was with minimal discomfort. Straight leg raise was tolerates approximately 30 without increase of pain with dorsiflexion noted. DTRs appeared to be trace at the knees and there was negative clonus negative Homans. Abdomen nontender with no costovertebral tenderness noted.    Assessment & Plan:  Degenerative disc disease lumbar spine Degenerative changes L4-5 disc desiccation, circumferential bulging and focal protrusion in the left neural foramen to extraforaminal region with facet and ligamentous hypertrophy, L5-S1 facet degeneration with no disc abnormalities.  Lumbar facet syndrome  Sacroiliac joint dysfunction    PLAN  Continue present medication Nucynta   Block of the nerves to the sacroiliac joint to be performed at time of return appointment  F/U PCP Dr. Jeananne Rama for evaliation of  BP and general medical  condition.   F/U surgical evaluation. May consider pending follow-up evaluation  F/U Dr.Su as planned   F/U neurological evaluation. May consider pending follow-up evaluations  Radiofrequency rhizolysis lumbar facet medial branch nerves to be performed pending insurance approval  Patient to call [facility] should patient have concerns prior to scheduled return appointment.

## 2015-09-05 NOTE — Progress Notes (Signed)
Safety precautions to be maintained throughout the outpatient stay will include: orient to surroundings, keep bed in low position, maintain call bell within reach at all times, provide assistance with transfer out of bed and ambulation.  

## 2015-09-18 ENCOUNTER — Encounter: Payer: Self-pay | Admitting: Pain Medicine

## 2015-09-18 ENCOUNTER — Ambulatory Visit: Payer: Medicare Other | Attending: Pain Medicine | Admitting: Pain Medicine

## 2015-09-18 VITALS — BP 141/60 | HR 68 | Temp 97.7°F | Resp 18 | Ht 62.0 in | Wt 187.0 lb

## 2015-09-18 DIAGNOSIS — M5126 Other intervertebral disc displacement, lumbar region: Secondary | ICD-10-CM | POA: Diagnosis not present

## 2015-09-18 DIAGNOSIS — M79604 Pain in right leg: Secondary | ICD-10-CM | POA: Diagnosis present

## 2015-09-18 DIAGNOSIS — M47817 Spondylosis without myelopathy or radiculopathy, lumbosacral region: Secondary | ICD-10-CM | POA: Diagnosis not present

## 2015-09-18 DIAGNOSIS — M5136 Other intervertebral disc degeneration, lumbar region: Secondary | ICD-10-CM | POA: Diagnosis not present

## 2015-09-18 DIAGNOSIS — M47816 Spondylosis without myelopathy or radiculopathy, lumbar region: Secondary | ICD-10-CM

## 2015-09-18 DIAGNOSIS — M5137 Other intervertebral disc degeneration, lumbosacral region: Secondary | ICD-10-CM

## 2015-09-18 DIAGNOSIS — M533 Sacrococcygeal disorders, not elsewhere classified: Secondary | ICD-10-CM

## 2015-09-18 DIAGNOSIS — M545 Low back pain: Secondary | ICD-10-CM | POA: Insufficient documentation

## 2015-09-18 DIAGNOSIS — M79605 Pain in left leg: Secondary | ICD-10-CM | POA: Diagnosis present

## 2015-09-18 MED ORDER — MIDAZOLAM HCL 5 MG/5ML IJ SOLN
INTRAMUSCULAR | Status: AC
  Administered 2015-09-18: 09:00:00
  Filled 2015-09-18: qty 5

## 2015-09-18 MED ORDER — LACTATED RINGERS IV SOLN: 1000.0000 mL | INTRAVENOUS | Status: DC

## 2015-09-18 MED ORDER — ORPHENADRINE CITRATE 30 MG/ML IJ SOLN: 60.0000 mg | Freq: Once | INTRAMUSCULAR | Status: DC

## 2015-09-18 MED ORDER — ORPHENADRINE CITRATE 30 MG/ML IJ SOLN
INTRAMUSCULAR | Status: AC
  Administered 2015-09-18: 08:00:00
  Filled 2015-09-18: qty 2

## 2015-09-18 MED ORDER — CEFUROXIME AXETIL 250 MG PO TABS: 250.0000 mg | ORAL_TABLET | Freq: Two times a day (BID) | ORAL | Status: DC

## 2015-09-18 MED ORDER — CEFAZOLIN SODIUM 1-5 GM-% IV SOLN: 1.0000 g | Freq: Once | INTRAVENOUS | Status: DC

## 2015-09-18 MED ORDER — FENTANYL CITRATE (PF) 100 MCG/2ML IJ SOLN: 100.0000 ug | Freq: Once | INTRAMUSCULAR | Status: DC

## 2015-09-18 MED ORDER — MIDAZOLAM HCL 5 MG/5ML IJ SOLN: 5.0000 mg | Freq: Once | INTRAMUSCULAR | Status: DC

## 2015-09-18 MED ORDER — BUPIVACAINE HCL (PF) 0.25 % IJ SOLN
INTRAMUSCULAR | Status: AC
  Administered 2015-09-18: 08:00:00
  Filled 2015-09-18: qty 30

## 2015-09-18 MED ORDER — TRIAMCINOLONE ACETONIDE 40 MG/ML IJ SUSP: 40.0000 mg | Freq: Once | INTRAMUSCULAR | Status: DC

## 2015-09-18 MED ORDER — BUPIVACAINE HCL (PF) 0.25 % IJ SOLN: 30.0000 mL | Freq: Once | INTRAMUSCULAR | Status: DC

## 2015-09-18 MED ORDER — FENTANYL CITRATE (PF) 100 MCG/2ML IJ SOLN
INTRAMUSCULAR | Status: AC
  Administered 2015-09-18: 100 ug
  Filled 2015-09-18: qty 2

## 2015-09-18 MED ORDER — TRIAMCINOLONE ACETONIDE 40 MG/ML IJ SUSP
INTRAMUSCULAR | Status: AC
  Administered 2015-09-18: 08:00:00
  Filled 2015-09-18: qty 1

## 2015-09-18 MED ORDER — CEFAZOLIN SODIUM 1 G IJ SOLR
INTRAMUSCULAR | Status: AC
  Administered 2015-09-18: 08:00:00 via INTRAVENOUS
  Filled 2015-09-18: qty 10

## 2015-09-18 NOTE — Progress Notes (Signed)
Safety precautions to be maintained throughout the outpatient stay will include: orient to surroundings, keep bed in low position, maintain call bell within reach at all times, provide assistance with transfer out of bed and ambulation.  

## 2015-09-18 NOTE — Progress Notes (Signed)
Subjective:    Patient ID: Susan Houston, female    DOB: [birth year], 54 y.o.   MRN: [id number]  HPI  PROCEDURE:  Block of nerves to the sacroiliac joint.   NOTE:  The patient is a 54 y.o. female who returns to the [facility] for further evaluation and treatment of pain involving the lower back and lower extremity region with pain in the region of the buttocks as well. Prior MRI studies reveal Degenerative disc disease lumbar spine Degenerative changes L4-5 disc desiccation, circumferential bulging and focal protrusion in the left neural foramen to extraforaminal region with facet and ligamentous hypertrophy, L5-S1 facet degeneration with no disc abnormalities the patient is with reproduction of severe pain with palpation of the PSIS and PII S regions. The patient is with positive Patrick's maneuver as well..   There is concern regarding a significant component of the patient's pain being due to sacroiliac joint dysfunction The risks, benefits, expectations of the procedure have been discussed and explained to the patient who is understanding and willing to proceed with interventional treatment in attempt to decrease severity of patient's symptoms, minimize the risk of medication escalation and  hopefully retard the progression of the patient's symptoms. We will proceed with what is felt to be a medically necessary procedure, block of nerves to the sacroiliac joint.   DESCRIPTION OF PROCEDURE:  Block of nerves to the sacroiliac joint.   The patient was taken to the fluoroscopy suite. With the patient in the prone position with EKG, blood pressure, pulse and pulse oximetry monitoring, IV Versed, IV fentanyl conscious sedation, Betadine prep of proposed entry site was performed.   Block of nerves at the L5 vertebral body level.   With the patient in prone position, under fluoroscopic guidance, a 22 -gauge needle was inserted at the L5 vertebral body level on the left side. With 15 degrees  oblique orientation a 22 -gauge needle was inserted in the region known as Burton's eye or eye of the Scotty dog. Following documentation of needle placement in the area of Burton's eye or eye of the Scotty dog under fluoroscopic guidance, needle placement was then accomplished at the sacral ala level on the left side.   Needle placement at the sacral ala.   With the patient in prone position under fluoroscopic guidance with AP view of the lumbosacral spine, a 22 -gauge needle was inserted in the region known as the sacral ala on the left side. Following documentation of needle placement on the left side under fluoroscopic guidance needle placement was then accomplished at the S1 foramen level.   Needle placement at the S1 foramen level.   With the patient in prone position under fluoroscopic guidance with AP view of the lumbosacral spine and cephalad orientation, a 22 -gauge needle was inserted at the superior and lateral border of the S1 foramen on the left side. Following documentation of needle placement at the S1 foramen level on the left side, needle placement was then accomplished at the S2 foramen level on the left side.   Needle placement at the S2 foramen level.   With the patient in prone position with AP view of the lumbosacral spine with cephalad orientation, a 22 - gauge needle was inserted at the superior and lateral border of the S2 foramen under fluoroscopic guidance on the left side. Following needle placement at the L5 vertebral body level, sacral ala, S1 foramen and S2 foramen on the left left side, needle placement was verified  on lateral view under fluoroscopic guidance.  Following needle placement documentation on lateral view, each needle was injected with 1 mL of 0.25% bupivacaine and Kenalog.   BLOCK OF THE NERVES TO SACROILIAC JOINT ON THE RIGHT SIDE The procedure was performed on the right side at the same levels as was performed on the left side and utilizing the same  technique as on the left side and was performed under fluoroscopic guidance as on the left side   A total of '10mg'$  of Kenalog was utilized for the procedure.   PLAN:  1. Medications: The patient will continue presently prescribed medications Nucynta 2. The patient will be considered for modification of treatment regimen pending response to the procedure performed on today's visit.  3. The patient is to follow-up with primary care physician Dr. Jeananne Rama evaluation of blood pressure and general medical condition following the procedure performed on today's visit.  4. Surgical evaluation as discussed.  5. Neurological evaluation as discussed. F/U Dr. Kasandra Knudsen as planned 6. The patient may be a candidate for radiofrequency procedures, implantation devices and other treatment pending response to treatment performed on today's visit and follow-up evaluation.  7. The patient has been advised to adhere to proper body mechanics and to avoid activities which may exacerbate the patient's symptoms.   Return appointment to [facility] as scheduled.    Review of Systems     Objective:   Physical Exam        Assessment & Plan:

## 2015-09-18 NOTE — Patient Instructions (Addendum)
PLAN  Continue present medication Nucynta and begin taking antibiotic Ceftin as prescribed. Please obtain your antibiotic today and begin taking antibiotic today  F/U PCP Dr. Jeananne Rama for evaliation of  BP and general medical  condition.   F/U surgical evaluation. May consider pending follow-up evaluation  F/U Dr.Su as planned   F/U neurological evaluation. May consider pending follow-up evaluations  May consider radiofrequency rhizolysis or intraspinal procedures pending response to present treatment and F/U evaluation.  Patient to call [facility] Management Center should patient have concerns prior to scheduled return appointment. Pain Management Discharge Instructions  General Discharge Instructions :  If you need to reach your doctor call: Monday-Friday 8:00 am - 4:00 pm at [phone] or toll free [phone].  After clinic hours [phone] to have operator reach doctor.  Bring all of your medication bottles to all your appointments in the pain clinic.  To cancel or reschedule your appointment with Pain Management please remember to call 24 hours in advance to avoid a fee.  Refer to the educational materials which you have been given on: General Risks, I had my Procedure. Discharge Instructions, Post Sedation.  Post Procedure Instructions:  The drugs you were given will stay in your system until tomorrow, so for the next 24 hours you should not drive, make any legal decisions or drink any alcoholic beverages.  You may eat anything you prefer, but it is better to start with liquids then soups and crackers, and gradually work up to solid foods.    Please notify your doctor immediately if you have any unusual bleeding, trouble breathing or pain that is not related to your normal pain.  Depending on the type of procedure that was done, some parts of your body may feel week and/or numb.  This usually clears up by tonight or the next day.  Walk with the use of an assistive device  or accompanied by an adult for the 24 hours.  You may use ice on the affected area for the first 24 hours.  Put ice in a Ziploc bag and cover with a towel and place against area 15 minutes on 15 minutes off.  You may switch to heat after 24 hours.GENERAL RISKS AND COMPLICATIONS  What are the risk, side effects and possible complications? Generally speaking, most procedures are safe.  However, with any procedure there are risks, side effects, and the possibility of complications.  The risks and complications are dependent upon the sites that are lesioned, or the type of nerve block to be performed.  The closer the procedure is to the spine, the more serious the risks are.  Great care is taken when placing the radio frequency needles, block needles or lesioning probes, but sometimes complications can occur. 1. Infection: Any time there is an injection through the skin, there is a risk of infection.  This is why sterile conditions are used for these blocks.  There are four possible types of infection. 1. Localized skin infection. 2. Central Nervous System Infection-This can be in the form of Meningitis, which can be deadly. 3. Epidural Infections-This can be in the form of an epidural abscess, which can cause pressure inside of the spine, causing compression of the spinal cord with subsequent paralysis. This would require an emergency surgery to decompress, and there are no guarantees that the patient would recover from the paralysis. 4. Discitis-This is an infection of the intervertebral discs.  It occurs in about 1% of discography procedures.  It is difficult to treat and  it may lead to surgery.        2. Pain: the needles have to go through skin and soft tissues, will cause soreness.       3. Damage to internal structures:  The nerves to be lesioned may be near blood vessels or    other nerves which can be potentially damaged.       4. Bleeding: Bleeding is more common if the patient is taking blood  thinners such as  aspirin, Coumadin, Ticiid, Plavix, etc., or if he/she have some genetic predisposition  such as hemophilia. Bleeding into the spinal canal can cause compression of the spinal  cord with subsequent paralysis.  This would require an emergency surgery to  decompress and there are no guarantees that the patient would recover from the  paralysis.       5. Pneumothorax:  Puncturing of a lung is a possibility, every time a needle is introduced in  the area of the chest or upper back.  Pneumothorax refers to free air around the  collapsed lung(s), inside of the thoracic cavity (chest cavity).  Another two possible  complications related to a similar event would include: Hemothorax and Chylothorax.   These are variations of the Pneumothorax, where instead of air around the collapsed  lung(s), you may have blood or chyle, respectively.       6. Spinal headaches: They may occur with any procedures in the area of the spine.       7. Persistent CSF (Cerebro-Spinal Fluid) leakage: This is a rare problem, but may occur  with prolonged intrathecal or epidural catheters either due to the formation of a fistulous  track or a dural tear.       8. Nerve damage: By working so close to the spinal cord, there is always a possibility of  nerve damage, which could be as serious as a permanent spinal cord injury with  paralysis.       9. Death:  Although rare, severe deadly allergic reactions known as "Anaphylactic  reaction" can occur to any of the medications used.      10. Worsening of the symptoms:  We can always make thing worse.  What are the chances of something like this happening? Chances of any of this occuring are extremely low.  By statistics, you have more of a chance of getting killed in a motor vehicle accident: while driving to the hospital than any of the above occurring .  Nevertheless, you should be aware that they are possibilities.  In general, it is similar to taking a shower.  Everybody knows  that you can slip, hit your head and get killed.  Does that mean that you should not shower again?  Nevertheless always keep in mind that statistics do not mean anything if you happen to be on the wrong side of them.  Even if a procedure has a 1 (one) in a 1,000,000 (million) chance of going wrong, it you happen to be that one..Also, keep in mind that by statistics, you have more of a chance of having something go wrong when taking medications.  Who should not have this procedure? If you are on a blood thinning medication (e.g. Coumadin, Plavix, see list of "Blood Thinners"), or if you have an active infection going on, you should not have the procedure.  If you are taking any blood thinners, please inform your physician.  How should I prepare for this procedure?  Do not eat or drink anything  at least six hours prior to the procedure.  Bring a driver with you .  It cannot be a taxi.  Come accompanied by an adult that can drive you back, and that is strong enough to help you if your legs get weak or numb from the local anesthetic.  Take all of your medicines the morning of the procedure with just enough water to swallow them.  If you have diabetes, make sure that you are scheduled to have your procedure done first thing in the morning, whenever possible.  If you have diabetes, take only half of your insulin dose and notify our nurse that you have done so as soon as you arrive at the clinic.  If you are diabetic, but only take blood sugar pills (oral hypoglycemic), then do not take them on the morning of your procedure.  You may take them after you have had the procedure.  Do not take aspirin or any aspirin-containing medications, at least eleven (11) days prior to the procedure.  They may prolong bleeding.  Wear loose fitting clothing that may be easy to take off and that you would not mind if it got stained with Betadine or blood.  Do not wear any jewelry or perfume  Remove any nail  coloring.  It will interfere with some of our monitoring equipment.  NOTE: Remember that this is not meant to be interpreted as a complete list of all possible complications.  Unforeseen problems may occur.  BLOOD THINNERS The following drugs contain aspirin or other products, which can cause increased bleeding during surgery and should not be taken for 2 weeks prior to and 1 week after surgery.  If you should need take something for relief of minor pain, you may take acetaminophen which is found in Tylenol,m Datril, Anacin-3 and Panadol. It is not blood thinner. The products listed below are.  Do not take any of the products listed below in addition to any listed on your instruction sheet.  A.P.C or A.P.C with Codeine Codeine Phosphate Capsules #3 Ibuprofen Ridaura  ABC compound Congesprin Imuran rimadil  Advil Cope Indocin Robaxisal  Alka-Seltzer Effervescent Pain Reliever and Antacid Coricidin or Coricidin-D  Indomethacin Rufen  Alka-Seltzer plus Cold Medicine Cosprin Ketoprofen S-A-C Tablets  Anacin Analgesic Tablets or Capsules Coumadin Korlgesic Salflex  Anacin Extra Strength Analgesic tablets or capsules CP-2 Tablets Lanoril Salicylate  Anaprox Cuprimine Capsules Levenox Salocol  Anexsia-D Dalteparin Magan Salsalate  Anodynos Darvon compound Magnesium Salicylate Sine-off  Ansaid Dasin Capsules Magsal Sodium Salicylate  Anturane Depen Capsules Marnal Soma  APF Arthritis pain formula Dewitt's Pills Measurin Stanback  Argesic Dia-Gesic Meclofenamic Sulfinpyrazone  Arthritis Bayer Timed Release Aspirin Diclofenac Meclomen Sulindac  Arthritis pain formula Anacin Dicumarol Medipren Supac  Analgesic (Safety coated) Arthralgen Diffunasal Mefanamic Suprofen  Arthritis Strength Bufferin Dihydrocodeine Mepro Compound Suprol  Arthropan liquid Dopirydamole Methcarbomol with Aspirin Synalgos  ASA tablets/Enseals Disalcid Micrainin Tagament  Ascriptin Doan's Midol Talwin  Ascriptin A/D Dolene  Mobidin Tanderil  Ascriptin Extra Strength Dolobid Moblgesic Ticlid  Ascriptin with Codeine Doloprin or Doloprin with Codeine Momentum Tolectin  Asperbuf Duoprin Mono-gesic Trendar  Aspergum Duradyne Motrin or Motrin IB Triminicin  Aspirin plain, buffered or enteric coated Durasal Myochrisine Trigesic  Aspirin Suppositories Easprin Nalfon Trillsate  Aspirin with Codeine Ecotrin Regular or Extra Strength Naprosyn Uracel  Atromid-S Efficin Naproxen Ursinus  Auranofin Capsules Elmiron Neocylate Vanquish  Axotal Emagrin Norgesic Verin  Azathioprine Empirin or Empirin with Codeine Normiflo Vitamin E  Azolid Emprazil Nuprin Voltaren  Bayer Aspirin plain, buffered or children's or timed BC Tablets or powders Encaprin Orgaran Warfarin Sodium  Buff-a-Comp Enoxaparin Orudis Zorpin  Buff-a-Comp with Codeine Equegesic Os-Cal-Gesic   Buffaprin Excedrin plain, buffered or Extra Strength Oxalid   Bufferin Arthritis Strength Feldene Oxphenbutazone   Bufferin plain or Extra Strength Feldene Capsules Oxycodone with Aspirin   Bufferin with Codeine Fenoprofen Fenoprofen Pabalate or Pabalate-SF   Buffets II Flogesic Panagesic   Buffinol plain or Extra Strength Florinal or Florinal with Codeine Panwarfarin   Buf-Tabs Flurbiprofen Penicillamine   Butalbital Compound Four-way cold tablets Penicillin   Butazolidin Fragmin Pepto-Bismol   Carbenicillin Geminisyn Percodan   Carna Arthritis Reliever Geopen Persantine   Carprofen Gold's salt Persistin   Chloramphenicol Goody's Phenylbutazone   Chloromycetin Haltrain Piroxlcam   Clmetidine heparin Plaquenil   Cllnoril Hyco-pap Ponstel   Clofibrate Hydroxy chloroquine Propoxyphen         Before stopping any of these medications, be sure to consult the physician who ordered them.  Some, such as Coumadin (Warfarin) are ordered to prevent or treat serious conditions such as "deep thrombosis", "pumonary embolisms", and other heart problems.  The amount of time that  you may need off of the medication may also vary with the medication and the reason for which you were taking it.  If you are taking any of these medications, please make sure you notify your pain physician before you undergo any procedures.   Prescription to pick up at pharmacy

## 2015-09-19 ENCOUNTER — Telehealth: Payer: Self-pay | Admitting: *Deleted

## 2015-09-19 NOTE — Telephone Encounter (Signed)
Message left

## 2015-10-01 ENCOUNTER — Ambulatory Visit: Payer: Medicare Other | Attending: Pain Medicine | Admitting: Pain Medicine

## 2015-10-01 ENCOUNTER — Encounter: Payer: Self-pay | Admitting: Pain Medicine

## 2015-10-01 VITALS — BP 130/49 | HR 59 | Temp 98.0°F | Resp 16 | Ht 62.0 in | Wt 173.0 lb

## 2015-10-01 DIAGNOSIS — M5126 Other intervertebral disc displacement, lumbar region: Secondary | ICD-10-CM | POA: Insufficient documentation

## 2015-10-01 DIAGNOSIS — M533 Sacrococcygeal disorders, not elsewhere classified: Secondary | ICD-10-CM

## 2015-10-01 DIAGNOSIS — M5137 Other intervertebral disc degeneration, lumbosacral region: Secondary | ICD-10-CM

## 2015-10-01 DIAGNOSIS — M545 Low back pain: Secondary | ICD-10-CM | POA: Diagnosis present

## 2015-10-01 DIAGNOSIS — M5136 Other intervertebral disc degeneration, lumbar region: Secondary | ICD-10-CM | POA: Insufficient documentation

## 2015-10-01 DIAGNOSIS — M791 Myalgia: Secondary | ICD-10-CM | POA: Diagnosis not present

## 2015-10-01 DIAGNOSIS — M5416 Radiculopathy, lumbar region: Secondary | ICD-10-CM | POA: Diagnosis not present

## 2015-10-01 DIAGNOSIS — M47816 Spondylosis without myelopathy or radiculopathy, lumbar region: Secondary | ICD-10-CM | POA: Insufficient documentation

## 2015-10-01 DIAGNOSIS — M79604 Pain in right leg: Secondary | ICD-10-CM | POA: Diagnosis present

## 2015-10-01 DIAGNOSIS — M79605 Pain in left leg: Secondary | ICD-10-CM | POA: Diagnosis present

## 2015-10-01 DIAGNOSIS — M47817 Spondylosis without myelopathy or radiculopathy, lumbosacral region: Secondary | ICD-10-CM | POA: Diagnosis not present

## 2015-10-01 MED ORDER — TAPENTADOL HCL 50 MG PO TABS: ORAL_TABLET | ORAL | Status: DC

## 2015-10-01 NOTE — Patient Instructions (Signed)
PLAN  Continue present medication Nucynta   F/U PCP Dr. Jeananne Rama for evaliation of  BP and general medical  condition.   F/U surgical evaluation. May consider pending follow-up evaluation  F/U Dr.Su as planned   F/U neurological evaluation. May consider pending follow-up evaluations  Radiofrequency rhizolysis lumbar facet medial branch nerves to be performed pending insurance approval  Patient to call Pain Management Center should patient have concerns prior to scheduled return appointment.

## 2015-10-01 NOTE — Progress Notes (Signed)
   Subjective:    Patient ID: Susan Houston, female    DOB: [birth year], 54 y.o.   MRN: [id number]  HPI   The patient is a 54 year old female who returns to pain management for further evaluation and treatment of pain involving the lumbar and lower extremity region predominantly the patient states that she has had significant improvement of her pain with previous medial freaks rhizolysis lumbar facet, medial branch nerve block, lock of nerves to the sacroiliac joint, and lumbar facet, medial branch nerve blocks. At the present time patient states the pain is fairly well controlled. We will continue Nucynta as prescribed at this time and will consider modification of treatment regimen pending follow-up evaluation. Patient denies any trauma change in events of daily living the call significant change in symptoms pathology. The patient is quite pleased with the amount of pain relief that she has obtained at this time. We will continue present medications and consider modification of treatment regimen should there be significant change of patient's condition. The patient is understanding and agreed to suggested treatment plan. Patient will follow-up with Dr. Kasandra Knudsen as planned as well. The patient agreed to suggested treatment plan      Review of Systems     Objective:   Physical Exam  There was mild tenderness to palpation of the splenius capitis and occipitalis musculature region. There was mild just palpation of the cervical facet cervical paraspinal musculature region. There appeared to be unremarkable Spurling's maneuver. There was mild tenderness of the thoracic facet thoracic paraspinal musculature region with no crepitus of the thoracic region noted. Palpation over the lumbar paraspinal musculature region lumbar facet region was with moderate discomfort. Lateral bending rotation extension and palpation of the lumbar facets reproduce moderate discomfort. There was tends to palpation over the PSIS and PII  S region a moderate degree. There was moderate tenderness to palpation of the greater trochanteric region and iliotibial band region. Straight leg raise was tolerates approximately 30 without increased pain with dorsiflexion noted. EHL strength appeared to be decreased and no definite sensory deficit or dermatomal dystrophy detected. There was negative clonus negative Homans. DTRs were difficult to this patient had difficulty relaxing. Abdomen was nontender with no costovertebral tenderness noted.      Assessment & Plan:      Degenerative disc disease lumbar spine Degenerative changes L4-5 disc desiccation, circumferential bulging and focal protrusion in the left neural foramen to extraforaminal region with facet and ligamentous hypertrophy, L5-S1 facet degeneration with no disc abnormalities.  Lumbar facet syndrome  Sacroiliac joint dysfunction    PLAN  Continue present medication Nucynta   F/U PCP Dr. Jeananne Rama for evaliation of  BP and general medical  condition.   F/U surgical evaluation. May consider pending follow-up evaluation  F/U Dr.Su as planned   F/U neurological evaluation. May consider pending follow-up evaluations  Radiofrequency rhizolysis lumbar facet medial branch nerves to be performed pending insurance approval  Patient to call Pain Management Center should patient have concerns prior to scheduled return appointment.

## 2015-10-01 NOTE — Progress Notes (Signed)
Safety precautions to be maintained throughout the outpatient stay will include: orient to surroundings, keep bed in low position, maintain call bell within reach at all times, provide assistance with transfer out of bed and ambulation.  

## 2015-10-18 ENCOUNTER — Emergency Department: Payer: Medicare Other

## 2015-10-18 ENCOUNTER — Emergency Department
Admission: EM | Admit: 2015-10-18 | Discharge: 2015-10-18 | Disposition: A | Discharge status: Home / Self Care | Payer: Medicare Other | Attending: Student | Admitting: Student

## 2015-10-18 ENCOUNTER — Encounter: Payer: Self-pay | Admitting: Emergency Medicine

## 2015-10-18 DIAGNOSIS — I10 Essential (primary) hypertension: Secondary | ICD-10-CM | POA: Diagnosis not present

## 2015-10-18 DIAGNOSIS — R079 Chest pain, unspecified: Secondary | ICD-10-CM

## 2015-10-18 DIAGNOSIS — J441 Chronic obstructive pulmonary disease with (acute) exacerbation: Secondary | ICD-10-CM | POA: Insufficient documentation

## 2015-10-18 DIAGNOSIS — J449 Chronic obstructive pulmonary disease, unspecified: Secondary | ICD-10-CM | POA: Diagnosis not present

## 2015-10-18 DIAGNOSIS — Z79899 Other long term (current) drug therapy: Secondary | ICD-10-CM | POA: Diagnosis not present

## 2015-10-18 DIAGNOSIS — F17210 Nicotine dependence, cigarettes, uncomplicated: Secondary | ICD-10-CM | POA: Insufficient documentation

## 2015-10-18 DIAGNOSIS — Z7951 Long term (current) use of inhaled steroids: Secondary | ICD-10-CM | POA: Diagnosis not present

## 2015-10-18 DIAGNOSIS — R0602 Shortness of breath: Secondary | ICD-10-CM | POA: Diagnosis not present

## 2015-10-18 DIAGNOSIS — Z7982 Long term (current) use of aspirin: Secondary | ICD-10-CM | POA: Diagnosis not present

## 2015-10-18 LAB — CBC
HEMATOCRIT: 37.3 % (ref 35.0–47.0)
HEMOGLOBIN: 12.4 g/dL (ref 12.0–16.0)
MCH: 29.5 pg (ref 26.0–34.0)
MCHC: 33.2 g/dL (ref 32.0–36.0)
MCV: 88.8 fL (ref 80.0–100.0)
Platelets: 221 10*3/uL (ref 150–440)
RBC: 4.21 MIL/uL (ref 3.80–5.20)
RDW: 15 % — ABNORMAL HIGH (ref 11.5–14.5)
WBC: 6.6 10*3/uL (ref 3.6–11.0)

## 2015-10-18 LAB — BASIC METABOLIC PANEL
ANION GAP: 6 (ref 5–15)
BUN: 8 mg/dL (ref 6–20)
CALCIUM: 9.1 mg/dL (ref 8.9–10.3)
CO2: 25 mmol/L (ref 22–32)
Chloride: 106 mmol/L (ref 101–111)
Creatinine, Ser: 0.7 mg/dL (ref 0.44–1.00)
GLUCOSE: 92 mg/dL (ref 65–99)
POTASSIUM: 3.9 mmol/L (ref 3.5–5.1)
Sodium: 137 mmol/L (ref 135–145)

## 2015-10-18 LAB — TROPONIN I

## 2015-10-18 LAB — FIBRIN DERIVATIVES D-DIMER (ARMC ONLY): FIBRIN DERIVATIVES D-DIMER (ARMC): 553 — AB (ref 0–499)

## 2015-10-18 MED ORDER — PREDNISONE 20 MG PO TABS: 60.0000 mg | ORAL_TABLET | Freq: Every day | ORAL | Status: DC

## 2015-10-18 MED ORDER — IOHEXOL 350 MG/ML SOLN
75.0000 mL | Freq: Once | INTRAVENOUS | Status: AC | PRN
  Administered 2015-10-18: 75 mL via INTRAVENOUS

## 2015-10-18 MED ORDER — SODIUM CHLORIDE 0.9 % IV BOLUS (SEPSIS)
1000.0000 mL | Freq: Once | INTRAVENOUS | Status: AC
  Administered 2015-10-18: 1000 mL via INTRAVENOUS

## 2015-10-18 MED ORDER — ALBUTEROL SULFATE HFA 108 (90 BASE) MCG/ACT IN AERS: 2.0000 | INHALATION_SPRAY | Freq: Four times a day (QID) | RESPIRATORY_TRACT | Status: DC | PRN

## 2015-10-18 MED ORDER — ASPIRIN 81 MG PO CHEW
CHEWABLE_TABLET | ORAL | Status: AC
  Administered 2015-10-18: 324 mg via ORAL
  Filled 2015-10-18: qty 4

## 2015-10-18 MED ORDER — OXYCODONE HCL 5 MG PO TABS: 5.0000 mg | ORAL_TABLET | Freq: Four times a day (QID) | ORAL | Status: DC | PRN

## 2015-10-18 MED ORDER — OXYCODONE HCL 5 MG PO TABS
10.0000 mg | ORAL_TABLET | Freq: Once | ORAL | Status: AC
  Administered 2015-10-18: 10 mg via ORAL
  Filled 2015-10-18: qty 2

## 2015-10-18 MED ORDER — PREDNISONE 20 MG PO TABS
60.0000 mg | ORAL_TABLET | Freq: Once | ORAL | Status: AC
  Administered 2015-10-18: 60 mg via ORAL
  Filled 2015-10-18: qty 3

## 2015-10-18 MED ORDER — IPRATROPIUM-ALBUTEROL 0.5-2.5 (3) MG/3ML IN SOLN
3.0000 mL | Freq: Once | RESPIRATORY_TRACT | Status: AC
  Administered 2015-10-18: 3 mL via RESPIRATORY_TRACT
  Filled 2015-10-18: qty 3

## 2015-10-18 MED ORDER — ASPIRIN 81 MG PO CHEW
324.0000 mg | CHEWABLE_TABLET | Freq: Once | ORAL | Status: AC
  Administered 2015-10-18: 324 mg via ORAL

## 2015-10-18 NOTE — ED Notes (Signed)
Patient transported to CT 

## 2015-10-18 NOTE — ED Notes (Signed)
Lab notified to add D-dimer

## 2015-10-18 NOTE — ED Notes (Signed)
Pt reports she has been coughing for a couple of weeks. On Wednesday she started having constant pain on left side behind her breast. Pain is constant and does not change with movement or on inspiration.  She also states she is short of breath at times and states "it hurts to breathe." Pt sitting in wheelchair in NAD.

## 2015-10-18 NOTE — ED Provider Notes (Signed)
[facility] Emergency Department Provider Note  ____________________________________________  Time seen: Approximately 12:09 PM  I have reviewed the triage vital signs and the nursing notes.   HISTORY  Chief Complaint Shortness of Breath and Chest Pain    HPI NANCYJO GIVHAN is a 54 y.o. female with history of COPD and hyponatremia, chronic pain who presents for evaluation of 2 days constant left chest pain worse with movement, cough, deep inspiration, gradual onset, constant since onset, currently moderate. Patient reports that she has been coughing and wheezing for several weeks. She is also having shortness of breath. Her symptoms are not worse with exertion. She has had no fevers but she has not been using her inhalers. No vomiting, diarrhea, fevers or chills. No history of coronary artery disease, no history of PE or DVT.   Past Medical History  Diagnosis Date  . COPD (chronic obstructive pulmonary disease) ([place])   . Arthritis   . Bruises easily   . History of kidney stones   . Incontinence of urine   . Difficulty sleeping   . Depression   . Anxiety   . Back pain   . Chronic leg pain     BILATERAL  . Hyperlipidemia   . Diverticulitis   . Cancer ([place]) 2009    LARYNX CANCER - CHEMO / RADIATION (NO SURGERY)   . MRSA infection greater than 3 months ago     2008 right side of face  . Hypertension   . Nerve damage     right leg  . Asthma   . Hemorrhoids     Patient Active Problem List   Diagnosis Date Noted  . Facet syndrome, lumbar 03/28/2015  . DDD (degenerative disc disease), lumbar 03/05/2015  . DDD (degenerative disc disease), lumbosacral 02/23/2015  . Sacroiliac joint dysfunction 02/23/2015  . Saphenous neuralgia 09/24/2014    Past Surgical History  Procedure Laterality Date  . Joint replacement  2009/2010    BIL TOTAL KNEES  . Abdominal hysterectomy  1983  . Bladder suspension  2014  . Cholecystectomy    . Tonsillectomy    .  Excision neuroma Bilateral 09/24/2014    Procedure: BILATERAL OPEN SAPHENOUS NEURECTOMIES AND OPEN LEFT PERIPATELLA OSETOPHYTECTOMY;  Surgeon: Mauri Pole, MD;  Location: [facility];  Service: Orthopedics;  Laterality: Bilateral;    Current Outpatient Rx  Name  Route  Sig  Dispense  Refill  . Aspirin-Acetaminophen-Caffeine (EXCEDRIN PO)   Oral   Take by mouth. Pt not sure of dosage uses prn         . busPIRone (BUSPAR) 10 MG tablet   Oral   Take 10 mg by mouth 2 (two) times daily.         . cefUROXime (CEFTIN) 250 MG tablet   Oral   Take 1 tablet (250 mg total) by mouth 2 (two) times daily with a meal. Patient not taking: Reported on 07/11/2015   14 tablet   0   . cefUROXime (CEFTIN) 250 MG tablet   Oral   Take 1 tablet (250 mg total) by mouth 2 (two) times daily with a meal. Patient not taking: Reported on 09/05/2015   14 tablet   0   . cefUROXime (CEFTIN) 250 MG tablet   Oral   Take 1 tablet (250 mg total) by mouth 2 (two) times daily with a meal. Patient not taking: Reported on 09/05/2015   14 tablet   0   . cefUROXime (CEFTIN) 250 MG tablet  Oral   Take 1 tablet (250 mg total) by mouth 2 (two) times daily with a meal. Patient not taking: Reported on 10/01/2015   14 tablet   0   . ferrous sulfate 325 (65 FE) MG tablet   Oral   Take 1 tablet (325 mg total) by mouth 3 (three) times daily after meals.      3   . fluticasone (FLONASE) 50 MCG/ACT nasal spray   Each Nare   Place 2 sprays into both nostrils every morning.         . Fluticasone-Salmeterol (ADVAIR) 500-50 MCG/DOSE AEPB   Inhalation   Inhale 2 puffs into the lungs 2 (two) times daily.         Marland Kitchen gabapentin (NEURONTIN) 300 MG capsule   Oral   Take 300 mg by mouth 3 (three) times daily.         Marland Kitchen ibuprofen (ADVIL,MOTRIN) 600 MG tablet   Oral   Take 600 mg by mouth every 6 (six) hours as needed.         . loperamide (IMODIUM A-D) 2 MG tablet   Oral   Take 2 mg by mouth 4 (four) times  daily as needed for diarrhea or loose stools.         Marland Kitchen LORazepam (ATIVAN) 0.5 MG tablet   Oral   Take 0.5 mg by mouth 2 (two) times daily.         Marland Kitchen LORazepam (ATIVAN) 2 MG tablet   Oral   Take 2 mg by mouth 2 (two) times daily.         . risperiDONE (RISPERDAL) 1 MG tablet   Oral   Take 1 mg by mouth at bedtime.         . sertraline (ZOLOFT) 100 MG tablet   Oral   Take 200 mg by mouth daily.         . Suvorexant (BELSOMRA) 10 MG TABS   Oral   Take 1 tablet by mouth at bedtime as needed.         . tapentadol (NUCYNTA) 50 MG TABS tablet      Limit one tab by mouth per day or 2-3 times per day if tolerated .  (No oxycodone, no hydrocodone)   80 tablet   0   . temazepam (RESTORIL) 15 MG capsule   Oral   Take 15 mg by mouth at bedtime as needed for sleep.         Marland Kitchen topiramate (TOPAMAX) 50 MG tablet   Oral   Take 50 mg by mouth 2 (two) times daily.         . trazodone (DESYREL) 300 MG tablet   Oral   Take 300 mg by mouth 3 x daily with food.           Allergies Escitalopram and Lexapro  Family History  Problem Relation Age of Onset  . Asthma Mother   . Diabetes Mother   . Hyperlipidemia Mother   . Hypertension Mother   . Arthritis Father   . Cancer Father   . Hyperlipidemia Father   . Hypertension Father     Social History Social History  Substance Use Topics  . Smoking status: Current Every Day Smoker -- 0.50 packs/day    Types: Cigarettes  . Smokeless tobacco: None     Comment: uses e cigarette   . Alcohol Use: No    Review of Systems Constitutional: No fever/chills Eyes: No visual changes. ENT: No  sore throat. Cardiovascular: + chest pain. Respiratory: + shortness of breath. Gastrointestinal: No abdominal pain.  No nausea, no vomiting.  No diarrhea.  No constipation. Genitourinary: Negative for dysuria. Musculoskeletal: Negative for back pain. Skin: Negative for rash. Neurological: Negative for headaches, focal weakness or  numbness.  10-point ROS otherwise negative.  ____________________________________________   PHYSICAL EXAM:  VITAL SIGNS: ED Triage Vitals  Enc Vitals Group     BP 10/18/15 1047 125/56 mmHg     Pulse Rate 10/18/15 1047 76     Resp 10/18/15 1047 18     Temp 10/18/15 1047 98 F (36.7 C)     Temp Source 10/18/15 1047 Oral     SpO2 10/18/15 1047 95 %     Weight 10/18/15 1047 280 lb (127.007 kg)     Height 10/18/15 1047 '5\' 2"'$  (1.575 m)     Head Cir --      Peak Flow --      Pain Score 10/18/15 1052 9     Pain Loc --      Pain Edu? --      Excl. in [place]? --     Constitutional: Alert and oriented. Nontoxic appearing and in no acute distress but has frequent dry cough. Eyes: Conjunctivae are normal. PERRL. EOMI. Head: Atraumatic. Nose: No congestion/rhinnorhea. Mouth/Throat: Mucous membranes are moist.  Oropharynx non-erythematous. Neck: No stridor.  Cardiovascular: Normal rate, regular rhythm. Grossly normal heart sounds.  Good peripheral circulation. Respiratory: Normal respiratory effort.  No retractions. Diffuse respiratory wheezes with moderate air movement. Gastrointestinal: Soft and nontender. No distention. No CVA tenderness. Genitourinary: deferred Musculoskeletal: No lower extremity tenderness nor edema.  No joint effusions. Point tenderness throughout the left anterior chest wall as well as just under the breast, palpation seems to reproduce her pain. Neurologic:  Normal speech and language. No gross focal neurologic deficits are appreciated. No gait instability. Skin:  Skin is warm, dry and intact. No rash noted. Psychiatric: Mood and affect are normal. Speech and behavior are normal.  ____________________________________________   LABS (all labs ordered are listed, but only abnormal results are displayed)  Labs Reviewed  CBC - Abnormal; Notable for the following:    RDW 15.0 (*)    All other components within normal limits  FIBRIN DERIVATIVES D-DIMER ([facility] ONLY)  - Abnormal; Notable for the following:    Fibrin derivatives D-dimer ([facility]) 553 (*)    All other components within normal limits  BASIC METABOLIC PANEL  TROPONIN I   ____________________________________________  EKG  ED ECG REPORT I, Joanne Gavel, the attending physician, personally viewed and interpreted this ECG.   Date: 10/18/2015  EKG Time: 11:06  Rate: 78  Rhythm: normal EKG, normal sinus rhythm  Axis: normal  Intervals:none  ST&T Change: No acute ST elevation  ____________________________________________  RADIOLOGY  CXR IMPRESSION: No active cardiopulmonary disease.  CTA chest IMPRESSION: 1. No pulmonary emboli. 2. Marked progression of interstitial and obstructive lung disease since the prior CT of 05/18/2014. 3. Progressive bronchitic changes particularly in the lingula were there is slight linear atelectasis anterior laterally. ____________________________________________   PROCEDURES  Procedure(s) performed: None  Critical Care performed: No  ____________________________________________   INITIAL IMPRESSION / ASSESSMENT AND PLAN / ED COURSE  Pertinent labs & imaging results that were available during my care of the patient were reviewed by me and considered in my medical decision making (see chart for details).  PRISCELLA DONNA is a 54 y.o. female with history of COPD and hyponatremia, chronic  pain who presents for evaluation of 2 days constant left chest pain as well as worsening shortness of breath with wheeze. On exam, she is nontoxic appearing and in no acute distress though she does have frequent dry cough, no increased work of breathing but she does have eye surgery wheeze. She has point tenderness throughout the left chest wall. EKG is reassuring, troponin is negative and given 2 days of constant chest pain, ACS is unlikely. D-dimer was elevated at 553, awaiting CTA chest to rule out PE. We'll give DuoNeb treatment as well as steroids and Roxicodone  for pain which I suspect is musculoskeletal in nature.  ----------------------------------------- 2:33 PM on 10/18/2015 -----------------------------------------  Wheezing and pain improved. CTA chest is negative for any pulmonary emboli. There is progression of interstitial and obstructive lung disease. We'll DC with prednisone and albuterol inhalers for COPD exacerbation and Roxicodone for pain and cough. Discussed return precautions, new for close PCP follow-up and she is comfortable with the discharge plan. DC home. ____________________________________________   FINAL CLINICAL IMPRESSION(S) / ED DIAGNOSES  Final diagnoses:  SOB (shortness of breath)  Chest pain, unspecified chest pain type      Joanne Gavel, MD 10/18/15 1434

## 2015-10-29 ENCOUNTER — Encounter: Payer: Self-pay | Admitting: Pain Medicine

## 2015-10-30 ENCOUNTER — Encounter: Payer: Self-pay | Admitting: Unknown Physician Specialty

## 2015-10-30 ENCOUNTER — Ambulatory Visit (INDEPENDENT_AMBULATORY_CARE_PROVIDER_SITE_OTHER): Payer: Medicare Other | Admitting: Unknown Physician Specialty

## 2015-10-30 VITALS — BP 117/81 | HR 73 | Temp 98.1°F | Ht 63.6 in | Wt 195.6 lb

## 2015-10-30 DIAGNOSIS — R0789 Other chest pain: Secondary | ICD-10-CM

## 2015-10-30 NOTE — Progress Notes (Signed)
+------------------------------------------------+-+                                  +-+-+-+  BP 117/81 mmHg  Pulse 73  Temp(Src) 98.1 F (36.7 C)  Ht 5' 3.6" (1.615 m)  Wt 195 lb 9.6 oz (88.724 kg)  BMI 34.02 kg/m2  SpO2 91%   Subjective:    Patient ID: Susan Houston, female    DOB: October 24, 1960, 55 y.o.   MRN: [id number]  HPI: Susan Houston is a 55 y.o. female  Chief Complaint  Patient presents with  . Pain    pt states she has been having pain in left rib area, right up under her breast, for about 2 weeks now   Pt states she went to the ER last week for left sided chest wall pain.   She was diagnosed with a "pulled muscle" but pt does not think this is correct.  She is complaining of pain left side underneath her left breast.  Gets worse with any activity.  She currently sees Dr. Josefa Half for back pain who provides pain medication.    Review of the ER notes show that an EKG was normal and a chest x-ray showed a "worsening of her interstitial disease."  She was sent home on inhalers and Prednisone. CTA was negative.    Relevant past medical, surgical, family and social history reviewed and updated as indicated. Interim medical history since our last visit reviewed. Allergies and medications reviewed and updated.  Review of Systems  Per HPI unless specifically indicated above     Objective:    BP 117/81 mmHg  Pulse 73  Temp(Src) 98.1 F (36.7 C)  Ht 5' 3.6" (1.615 m)  Wt 195 lb 9.6 oz (88.724 kg)  BMI 34.02 kg/m2  SpO2 91%  Wt Readings from Last 3 Encounters:  10/30/15 195 lb 9.6 oz (88.724 kg)  10/18/15 280 lb (127.007 kg)  10/01/15 173 lb (78.472 kg)    Physical Exam  Constitutional: She is oriented to person, place, and time. She appears well-developed and well-nourished. No distress.  HENT:  Head: Normocephalic and atraumatic.  Eyes: Conjunctivae and lids are normal. Right eye exhibits no discharge. Left eye exhibits no discharge. No scleral icterus.  Neck: Normal  range of motion. Neck supple. No JVD present. Carotid bruit is not present.  Cardiovascular: Normal rate, regular rhythm and normal heart sounds.   Pulmonary/Chest: Effort normal and breath sounds normal.  Very tender to palpation left anterior chest wall just beneath her breasts  Abdominal: Normal appearance. There is no splenomegaly or hepatomegaly.  Musculoskeletal: Normal range of motion.  Neurological: She is alert and oriented to person, place, and time.  Skin: Skin is warm, dry and intact. No rash noted. No pallor.  Psychiatric: She has a normal mood and affect. Her behavior is normal. Judgment and thought content normal.    Results for orders placed or performed during the hospital encounter of 93/71/69  Basic metabolic panel  Result Value Ref Range   Sodium 137 135 - 145 mmol/L   Potassium 3.9 3.5 - 5.1 mmol/L   Chloride 106 101 - 111 mmol/L   CO2 25 22 - 32 mmol/L   Glucose, Bld 92 65 - 99 mg/dL   BUN 8 6 - 20 mg/dL   Creatinine, Ser 0.70 0.44 - 1.00 mg/dL   Calcium 9.1 8.9 - 10.3 mg/dL   GFR calc non Af  Amer >60 >60 mL/min   GFR calc Af Amer >60 >60 mL/min   Anion gap 6 5 - 15  CBC  Result Value Ref Range   WBC 6.6 3.6 - 11.0 K/uL   RBC 4.21 3.80 - 5.20 MIL/uL   Hemoglobin 12.4 12.0 - 16.0 g/dL   HCT 37.3 35.0 - 47.0 %   MCV 88.8 80.0 - 100.0 fL   MCH 29.5 26.0 - 34.0 pg   MCHC 33.2 32.0 - 36.0 g/dL   RDW 15.0 (H) 11.5 - 14.5 %   Platelets 221 150 - 440 K/uL  Troponin I  Result Value Ref Range   Troponin I <0.03 <0.031 ng/mL  Fibrin derivatives D-Dimer ([facility] only)  Result Value Ref Range   Fibrin derivatives D-dimer ([facility]) 553 (H) 0 - 499      Assessment & Plan:   Problem List Items Addressed This Visit    None    Visit Diagnoses    Chest wall pain    -  Primary    EKG, Chest CT and Chest x-ray negative at the ER. Pt reports no help with Prednisone.  Use Lidoderm patches over area.  See Dr. Josefa Half for pain management       Discussed pt with Dr. Sanda Klein    Follow up plan: Return if symptoms worsen or fail to improve, for and with Dr. Josefa Half on Monday.

## 2015-10-30 NOTE — Patient Instructions (Signed)
Use OTC Lidoderm patches.

## 2015-11-04 ENCOUNTER — Ambulatory Visit: Payer: Medicare Other | Attending: Pain Medicine | Admitting: Pain Medicine

## 2015-11-04 ENCOUNTER — Encounter: Payer: Self-pay | Admitting: Pain Medicine

## 2015-11-04 VITALS — BP 120/75 | HR 66 | Temp 98.3°F | Resp 16 | Ht 63.0 in | Wt 175.0 lb

## 2015-11-04 DIAGNOSIS — M533 Sacrococcygeal disorders, not elsewhere classified: Secondary | ICD-10-CM | POA: Diagnosis not present

## 2015-11-04 DIAGNOSIS — Z5181 Encounter for therapeutic drug level monitoring: Secondary | ICD-10-CM | POA: Diagnosis not present

## 2015-11-04 DIAGNOSIS — M5136 Other intervertebral disc degeneration, lumbar region: Secondary | ICD-10-CM

## 2015-11-04 DIAGNOSIS — M545 Low back pain: Secondary | ICD-10-CM | POA: Diagnosis present

## 2015-11-04 DIAGNOSIS — M791 Myalgia: Secondary | ICD-10-CM | POA: Diagnosis not present

## 2015-11-04 DIAGNOSIS — Z79891 Long term (current) use of opiate analgesic: Secondary | ICD-10-CM | POA: Diagnosis not present

## 2015-11-04 DIAGNOSIS — M5137 Other intervertebral disc degeneration, lumbosacral region: Secondary | ICD-10-CM

## 2015-11-04 DIAGNOSIS — M47816 Spondylosis without myelopathy or radiculopathy, lumbar region: Secondary | ICD-10-CM | POA: Diagnosis not present

## 2015-11-04 DIAGNOSIS — M5416 Radiculopathy, lumbar region: Secondary | ICD-10-CM | POA: Diagnosis not present

## 2015-11-04 DIAGNOSIS — M79606 Pain in leg, unspecified: Secondary | ICD-10-CM | POA: Diagnosis present

## 2015-11-04 DIAGNOSIS — M5126 Other intervertebral disc displacement, lumbar region: Secondary | ICD-10-CM | POA: Diagnosis not present

## 2015-11-04 DIAGNOSIS — M47817 Spondylosis without myelopathy or radiculopathy, lumbosacral region: Secondary | ICD-10-CM | POA: Diagnosis not present

## 2015-11-04 DIAGNOSIS — G8929 Other chronic pain: Secondary | ICD-10-CM | POA: Diagnosis not present

## 2015-11-04 DIAGNOSIS — Z79899 Other long term (current) drug therapy: Secondary | ICD-10-CM | POA: Diagnosis not present

## 2015-11-04 MED ORDER — TAPENTADOL HCL 50 MG PO TABS: ORAL_TABLET | ORAL | Status: DC

## 2015-11-04 NOTE — Progress Notes (Signed)
Safety precautions to be maintained throughout the outpatient stay will include: orient to surroundings, keep bed in low position, maintain call bell within reach at all times, provide assistance with transfer out of bed and ambulation.  

## 2015-11-04 NOTE — Patient Instructions (Addendum)
PLAN  Continue present medication Nucynta   Block of nerves to the sacroiliac joint to be performed at time of return appointment  F/U PCP Dr. Jeananne Rama for evaliation of  BP and general medical  condition.   F/U surgical evaluation. May consider pending follow-up evaluation  F/U Dr.Su as planned   F/U neurological evaluation. May consider pending follow-up evaluations  Radiofrequency rhizolysis lumbar facet medial branch nerves to be performed pending insurance approval  Patient to call [facility] Management Center should patient have concerns prior to scheduled return appointment.Pain Management Discharge Instructions  General Discharge Instructions :  If you need to reach your doctor call: Monday-Friday 8:00 am - 4:00 pm at [phone] or toll free [phone].  After clinic hours [phone] to have operator reach doctor.  Bring all of your medication bottles to all your appointments in the pain clinic.  To cancel or reschedule your appointment with Pain Management please remember to call 24 hours in advance to avoid a fee.  Refer to the educational materials which you have been given on: General Risks, I had my Procedure. Discharge Instructions, Post Sedation.  Post Procedure Instructions:  The drugs you were given will stay in your system until tomorrow, so for the next 24 hours you should not drive, make any legal decisions or drink any alcoholic beverages.  You may eat anything you prefer, but it is better to start with liquids then soups and crackers, and gradually work up to solid foods.  Please notify your doctor immediately if you have any unusual bleeding, trouble breathing or pain that is not related to your normal pain.  Depending on the type of procedure that was done, some parts of your body may feel week and/or numb.  This usually clears up by tonight or the next day.  Walk with the use of an assistive device or accompanied by an adult for the 24 hours.  You  may use ice on the affected area for the first 24 hours.  Put ice in a Ziploc bag and cover with a towel and place against area 15 minutes on 15 minutes off.  You may switch to heat after 24 hours.Sacroiliac (SI) Joint Injection Patient Information  Description: The sacroiliac joint connects the scrum (very low back and tailbone) to the ilium (a pelvic bone which also forms half of the hip joint).  Normally this joint experiences very little motion.  When this joint becomes inflamed or unstable low back and or hip and pelvis pain may result.  Injection of this joint with local anesthetics (numbing medicines) and steroids can provide diagnostic information and reduce pain.  This injection is performed with the aid of x-ray guidance into the tailbone area while you are lying on your stomach.   You may experience an electrical sensation down the leg while this is being done.  You may also experience numbness.  We also may ask if we are reproducing your normal pain during the injection.  Conditions which may be treated SI injection:   Low back, buttock, hip or leg pain  Preparation for the Injection:  1. Do not eat any solid food or dairy products within 6 hours of your appointment.  2. You may drink clear liquids up to 2 hours before appointment.  Clear liquids include water, black coffee, juice or soda.  No milk or cream please. 3. You may take your regular medications, including pain medications with a sip of water before your appointment.  Diabetics should hold regular insulin (  if take separately) and take 1/2 normal NPH dose the morning of the procedure.  Carry some sugar containing items with you to your appointment. 4. A driver must accompany you and be prepared to drive you home after your procedure. 5. Bring all of your current medications with you. 6. An IV may be inserted and sedation may be given at the discretion of the physician. 7. A blood pressure cuff, EKG and other monitors will  often be applied during the procedure.  Some patients may need to have extra oxygen administered for a short period.  8. You will be asked to provide medical information, including your allergies, prior to the procedure.  We must know immediately if you are taking blood thinners (like Coumadin/Warfarin) or if you are allergic to IV iodine contrast (dye).  We must know if you could possible be pregnant.  Possible side effects:   Bleeding from needle site  Infection (rare, may require surgery)  Nerve injury (rare)  Numbness & tingling (temporary)  A brief convulsion or seizure  Light-headedness (temporary)  Pain at injection site (several days)  Decreased blood pressure (temporary)  Weakness in the leg (temporary)   Call if you experience:   New onset weakness or numbness of an extremity below the injection site that last more than 8 hours.  Hives or difficulty breathing ( go to the emergency room)  Inflammation or drainage at the injection site  Any new symptoms which are concerning to you  Please note:  Although the local anesthetic injected can often make your back/ hip/ buttock/ leg feel good for several hours after the injections, the pain will likely return.  It takes 3-7 days for steroids to work in the sacroiliac area.  You may not notice any pain relief for at least that one week.  If effective, we will often do a series of three injections spaced 3-6 weeks apart to maximally decrease your pain.  After the initial series, we generally will wait some months before a repeat injection of the same type.  If you have any questions, please call [phone] [place]  What are the risk, side effects and possible complications? Generally speaking, most procedures are safe.  However, with any procedure there are risks, side effects, and the possibility of complications.  The risks and complications  are dependent upon the sites that are lesioned, or the type of nerve block to be performed.  The closer the procedure is to the spine, the more serious the risks are.  Great care is taken when placing the radio frequency needles, block needles or lesioning probes, but sometimes complications can occur. 1. Infection: Any time there is an injection through the skin, there is a risk of infection.  This is why sterile conditions are used for these blocks.  There are four possible types of infection. 1. Localized skin infection. 2. Central Nervous System Infection-This can be in the form of Meningitis, which can be deadly. 3. Epidural Infections-This can be in the form of an epidural abscess, which can cause pressure inside of the spine, causing compression of the spinal cord with subsequent paralysis. This would require an emergency surgery to decompress, and there are no guarantees that the patient would recover from the paralysis. 4. Discitis-This is an infection of the intervertebral discs.  It occurs in about 1% of discography procedures.  It is difficult to treat and it may lead to surgery.  2. Pain: the needles have to go through skin and soft tissues, will cause soreness.       3. Damage to internal structures:  The nerves to be lesioned may be near blood vessels or    other nerves which can be potentially damaged.       4. Bleeding: Bleeding is more common if the patient is taking blood thinners such as  aspirin, Coumadin, Ticiid, Plavix, etc., or if he/she have some genetic predisposition  such as hemophilia. Bleeding into the spinal canal can cause compression of the spinal  cord with subsequent paralysis.  This would require an emergency surgery to  decompress and there are no guarantees that the patient would recover from the  paralysis.       5. Pneumothorax:  Puncturing of a lung is a possibility, every time a needle is introduced in  the area of the chest or upper back.  Pneumothorax  refers to free air around the  collapsed lung(s), inside of the thoracic cavity (chest cavity).  Another two possible  complications related to a similar event would include: Hemothorax and Chylothorax.   These are variations of the Pneumothorax, where instead of air around the collapsed  lung(s), you may have blood or chyle, respectively.       6. Spinal headaches: They may occur with any procedures in the area of the spine.       7. Persistent CSF (Cerebro-Spinal Fluid) leakage: This is a rare problem, but may occur  with prolonged intrathecal or epidural catheters either due to the formation of a fistulous  track or a dural tear.       8. Nerve damage: By working so close to the spinal cord, there is always a possibility of  nerve damage, which could be as serious as a permanent spinal cord injury with  paralysis.       9. Death:  Although rare, severe deadly allergic reactions known as "Anaphylactic  reaction" can occur to any of the medications used.      10. Worsening of the symptoms:  We can always make thing worse.  What are the chances of something like this happening? Chances of any of this occuring are extremely low.  By statistics, you have more of a chance of getting killed in a motor vehicle accident: while driving to the hospital than any of the above occurring .  Nevertheless, you should be aware that they are possibilities.  In general, it is similar to taking a shower.  Everybody knows that you can slip, hit your head and get killed.  Does that mean that you should not shower again?  Nevertheless always keep in mind that statistics do not mean anything if you happen to be on the wrong side of them.  Even if a procedure has a 1 (one) in a 1,000,000 (million) chance of going wrong, it you happen to be that one..Also, keep in mind that by statistics, you have more of a chance of having something go wrong when taking medications.  Who should not have this procedure? If you are on a blood  thinning medication (e.g. Coumadin, Plavix, see list of "Blood Thinners"), or if you have an active infection going on, you should not have the procedure.  If you are taking any blood thinners, please inform your physician.  How should I prepare for this procedure?  Do not eat or drink anything at least six hours prior to the procedure.  Bring a driver  with you .  It cannot be a taxi.  Come accompanied by an adult that can drive you back, and that is strong enough to help you if your legs get weak or numb from the local anesthetic.  Take all of your medicines the morning of the procedure with just enough water to swallow them.  If you have diabetes, make sure that you are scheduled to have your procedure done first thing in the morning, whenever possible.  If you have diabetes, take only half of your insulin dose and notify our nurse that you have done so as soon as you arrive at the clinic.  If you are diabetic, but only take blood sugar pills (oral hypoglycemic), then do not take them on the morning of your procedure.  You may take them after you have had the procedure.  Do not take aspirin or any aspirin-containing medications, at least eleven (11) days prior to the procedure.  They may prolong bleeding.  Wear loose fitting clothing that may be easy to take off and that you would not mind if it got stained with Betadine or blood.  Do not wear any jewelry or perfume  Remove any nail coloring.  It will interfere with some of our monitoring equipment.  NOTE: Remember that this is not meant to be interpreted as a complete list of all possible complications.  Unforeseen problems may occur.  BLOOD THINNERS The following drugs contain aspirin or other products, which can cause increased bleeding during surgery and should not be taken for 2 weeks prior to and 1 week after surgery.  If you should need take something for relief of minor pain, you may take acetaminophen which is found in  Tylenol,m Datril, Anacin-3 and Panadol. It is not blood thinner. The products listed below are.  Do not take any of the products listed below in addition to any listed on your instruction sheet.  A.P.C or A.P.C with Codeine Codeine Phosphate Capsules #3 Ibuprofen Ridaura  ABC compound Congesprin Imuran rimadil  Advil Cope Indocin Robaxisal  Alka-Seltzer Effervescent Pain Reliever and Antacid Coricidin or Coricidin-D  Indomethacin Rufen  Alka-Seltzer plus Cold Medicine Cosprin Ketoprofen S-A-C Tablets  Anacin Analgesic Tablets or Capsules Coumadin Korlgesic Salflex  Anacin Extra Strength Analgesic tablets or capsules CP-2 Tablets Lanoril Salicylate  Anaprox Cuprimine Capsules Levenox Salocol  Anexsia-D Dalteparin Magan Salsalate  Anodynos Darvon compound Magnesium Salicylate Sine-off  Ansaid Dasin Capsules Magsal Sodium Salicylate  Anturane Depen Capsules Marnal Soma  APF Arthritis pain formula Dewitt's Pills Measurin Stanback  Argesic Dia-Gesic Meclofenamic Sulfinpyrazone  Arthritis Bayer Timed Release Aspirin Diclofenac Meclomen Sulindac  Arthritis pain formula Anacin Dicumarol Medipren Supac  Analgesic (Safety coated) Arthralgen Diffunasal Mefanamic Suprofen  Arthritis Strength Bufferin Dihydrocodeine Mepro Compound Suprol  Arthropan liquid Dopirydamole Methcarbomol with Aspirin Synalgos  ASA tablets/Enseals Disalcid Micrainin Tagament  Ascriptin Doan's Midol Talwin  Ascriptin A/D Dolene Mobidin Tanderil  Ascriptin Extra Strength Dolobid Moblgesic Ticlid  Ascriptin with Codeine Doloprin or Doloprin with Codeine Momentum Tolectin  Asperbuf Duoprin Mono-gesic Trendar  Aspergum Duradyne Motrin or Motrin IB Triminicin  Aspirin plain, buffered or enteric coated Durasal Myochrisine Trigesic  Aspirin Suppositories Easprin Nalfon Trillsate  Aspirin with Codeine Ecotrin Regular or Extra Strength Naprosyn Uracel  Atromid-S Efficin Naproxen Ursinus  Auranofin Capsules Elmiron Neocylate  Vanquish  Axotal Emagrin Norgesic Verin  Azathioprine Empirin or Empirin with Codeine Normiflo Vitamin E  Azolid Emprazil Nuprin Voltaren  Bayer Aspirin plain, buffered or children's or timed BC Tablets or powders  Encaprin Orgaran Warfarin Sodium  Buff-a-Comp Enoxaparin Orudis Zorpin  Buff-a-Comp with Codeine Equegesic Os-Cal-Gesic   Buffaprin Excedrin plain, buffered or Extra Strength Oxalid   Bufferin Arthritis Strength Feldene Oxphenbutazone   Bufferin plain or Extra Strength Feldene Capsules Oxycodone with Aspirin   Bufferin with Codeine Fenoprofen Fenoprofen Pabalate or Pabalate-SF   Buffets II Flogesic Panagesic   Buffinol plain or Extra Strength Florinal or Florinal with Codeine Panwarfarin   Buf-Tabs Flurbiprofen Penicillamine   Butalbital Compound Four-way cold tablets Penicillin   Butazolidin Fragmin Pepto-Bismol   Carbenicillin Geminisyn Percodan   Carna Arthritis Reliever Geopen Persantine   Carprofen Gold's salt Persistin   Chloramphenicol Goody's Phenylbutazone   Chloromycetin Haltrain Piroxlcam   Clmetidine heparin Plaquenil   Cllnoril Hyco-pap Ponstel   Clofibrate Hydroxy chloroquine Propoxyphen         Before stopping any of these medications, be sure to consult the physician who ordered them.  Some, such as Coumadin (Warfarin) are ordered to prevent or treat serious conditions such as "deep thrombosis", "pumonary embolisms", and other heart problems.  The amount of time that you may need off of the medication may also vary with the medication and the reason for which you were taking it.  If you are taking any of these medications, please make sure you notify your pain physician before you undergo any procedures.

## 2015-11-04 NOTE — Progress Notes (Signed)
   Subjective:    Patient ID: MARJEAN IMPERATO, female    DOB: 23-Sep-1961, 55 y.o.   MRN: [id number]  HPI The patient is a 55 year old female who returns to pain management for further evaluation and treatment of pain involving the lower back and lower extremity regions predominantly. The patient's pain is aggravated by standing walking twisting turning maneuvers with pain occurring across the lower back region radiating to the buttocks on the left as well as on the right.The patient is without recent trauma. The patient has had significant improvement of pain with prior interventional treatment performed in pain management Center. The patient's pain is aggravated by standing walking and climbing and becomes more intense as patient spends more time on the feet. We will proceed with block of nerves to the sacroiliac joint at time return appointment in attempt to decrease severity of symptoms, minimize progression of symptoms, and avoid the need for more involved treatment. The patient was with understanding and agreed to suggested treatment plan   Review of Systems     Objective:   Physical Exam  There was mild tenderness of the splenius capitis and occipitalis musculature regions. There was mild tenderness of the cervical facet cervical paraspinal musculature region. Palpation over the region of the acromial clavicular and glenohumeral joint regions reproduces minimal discomfort . Tinel and Phalen's maneuver were without increased pain of significant degree. There was unremarkable Spurling's maneuver.. Palpation over the region of the thoracic facet thoracic paraspinal musculature region was attends to palpation of mild degree. There was no crepitus of the thoracic region noted. Palpation over the lumbar paraspinal must reason lumbar facet region was with moderate tends to palpation with lateral bending rotation extension and palpation of the lumbar facets reproducing moderate to moderately severe  discomfort. There was severe tenderness to palpation over the region of the PSIS and PII S regions.. The patient appeared to be with positive Patrick's maneuver. EHL strength appeared to be decreased. There was negative clonus negative Homans. No sensory deficit or dermatomal distribution was detected.. Straight leg raising was tolerates approximately 20 without increased pain with dorsiflexion noted. There was negative clonus negative Homans. Abdomen was nontender with no costovertebral angle tenderness noted.      Assessment & Plan:    Degenerative disc disease lumbar spine Degenerative changes L4-5 disc desiccation, circumferential bulging and focal protrusion in the left neural foramen to extraforaminal region with facet and ligamentous hypertrophy, L5-S1 facet degeneration with no disc abnormalities.  Lumbar facet syndrome  Sacroiliac joint dysfunction      PLAN  Continue present medication Nucynta   Block of nerves to the sacroiliac joint to be performed at time of return appointment  F/U PCP Dr. Jeananne Rama for evaliation of  BP and general medical  condition.   F/U surgical evaluation. May consider pending follow-up evaluation  F/U Dr.Su as planned   F/U neurological evaluation. May consider pending follow-up evaluations  Radiofrequency rhizolysis lumbar facet medial branch nerves to be performed pending insurance approval  Patient to call Pain Management Center should patient have concerns prior to scheduled return appointment

## 2015-11-11 ENCOUNTER — Encounter: Payer: Self-pay | Admitting: Pain Medicine

## 2015-11-11 ENCOUNTER — Ambulatory Visit: Payer: Medicare Other | Attending: Pain Medicine | Admitting: Pain Medicine

## 2015-11-11 VITALS — BP 143/71 | HR 74 | Temp 98.1°F | Resp 16 | Ht 63.0 in | Wt 175.0 lb

## 2015-11-11 DIAGNOSIS — M5137 Other intervertebral disc degeneration, lumbosacral region: Secondary | ICD-10-CM

## 2015-11-11 DIAGNOSIS — M533 Sacrococcygeal disorders, not elsewhere classified: Secondary | ICD-10-CM

## 2015-11-11 DIAGNOSIS — M51379 Other intervertebral disc degeneration, lumbosacral region without mention of lumbar back pain or lower extremity pain: Secondary | ICD-10-CM

## 2015-11-11 DIAGNOSIS — M47817 Spondylosis without myelopathy or radiculopathy, lumbosacral region: Secondary | ICD-10-CM | POA: Diagnosis not present

## 2015-11-11 DIAGNOSIS — M51369 Other intervertebral disc degeneration, lumbar region without mention of lumbar back pain or lower extremity pain: Secondary | ICD-10-CM

## 2015-11-11 DIAGNOSIS — M5136 Other intervertebral disc degeneration, lumbar region: Secondary | ICD-10-CM | POA: Diagnosis not present

## 2015-11-11 DIAGNOSIS — M545 Low back pain: Secondary | ICD-10-CM | POA: Diagnosis not present

## 2015-11-11 DIAGNOSIS — M47816 Spondylosis without myelopathy or radiculopathy, lumbar region: Secondary | ICD-10-CM | POA: Diagnosis not present

## 2015-11-11 DIAGNOSIS — M79606 Pain in leg, unspecified: Secondary | ICD-10-CM | POA: Diagnosis present

## 2015-11-11 MED ORDER — ORPHENADRINE CITRATE 30 MG/ML IJ SOLN: 60.0000 mg | Freq: Once | INTRAMUSCULAR | Status: DC

## 2015-11-11 MED ORDER — MIDAZOLAM HCL 5 MG/5ML IJ SOLN
INTRAMUSCULAR | Status: AC
Start: 2015-11-11 — End: 2015-11-11
  Administered 2015-11-11: 5 mg via INTRAVENOUS
  Filled 2015-11-11: qty 5

## 2015-11-11 MED ORDER — BUPIVACAINE HCL (PF) 0.25 % IJ SOLN: 30.0000 mL | Freq: Once | INTRAMUSCULAR | Status: DC

## 2015-11-11 MED ORDER — FENTANYL CITRATE (PF) 100 MCG/2ML IJ SOLN
INTRAMUSCULAR | Status: AC
  Administered 2015-11-11: 100 ug via INTRAVENOUS
  Filled 2015-11-11: qty 2

## 2015-11-11 MED ORDER — TRIAMCINOLONE ACETONIDE 40 MG/ML IJ SUSP: 40.0000 mg | Freq: Once | INTRAMUSCULAR | Status: DC

## 2015-11-11 MED ORDER — MIDAZOLAM HCL 5 MG/5ML IJ SOLN: 5.0000 mg | Freq: Once | INTRAMUSCULAR | Status: DC

## 2015-11-11 MED ORDER — FENTANYL CITRATE (PF) 100 MCG/2ML IJ SOLN: 100.0000 ug | Freq: Once | INTRAMUSCULAR | Status: DC

## 2015-11-11 MED ORDER — ORPHENADRINE CITRATE 30 MG/ML IJ SOLN
INTRAMUSCULAR | Status: AC
  Administered 2015-11-11: 09:00:00
  Filled 2015-11-11: qty 2

## 2015-11-11 MED ORDER — CEFAZOLIN SODIUM 1 G IJ SOLR
INTRAMUSCULAR | Status: AC
  Administered 2015-11-11: 1 g via INTRAVENOUS
  Filled 2015-11-11: qty 10

## 2015-11-11 MED ORDER — CEFAZOLIN SODIUM 1-5 GM-% IV SOLN: 1.0000 g | Freq: Once | INTRAVENOUS | Status: DC

## 2015-11-11 MED ORDER — BUPIVACAINE HCL (PF) 0.25 % IJ SOLN
INTRAMUSCULAR | Status: AC
  Administered 2015-11-11: 09:00:00
  Filled 2015-11-11: qty 30

## 2015-11-11 MED ORDER — TRIAMCINOLONE ACETONIDE 40 MG/ML IJ SUSP
INTRAMUSCULAR | Status: AC
  Administered 2015-11-11: 09:00:00
  Filled 2015-11-11: qty 1

## 2015-11-11 MED ORDER — CEFUROXIME AXETIL 250 MG PO TABS: 250.0000 mg | ORAL_TABLET | Freq: Two times a day (BID) | ORAL | Status: DC

## 2015-11-11 NOTE — Progress Notes (Signed)
Safety precautions to be maintained throughout the outpatient stay will include: orient to surroundings, keep bed in low position, maintain call bell within reach at all times, provide assistance with transfer out of bed and ambulation.  

## 2015-11-11 NOTE — Progress Notes (Signed)
Subjective:    Patient ID: Susan Houston, female    DOB: October 09, 1961, 55 y.o.   MRN: [id number]  HPI PROCEDURE:  Block of nerves to the sacroiliac joint.   NOTE:  The patient is a 55 y.o. female who returns to the [facility] for further evaluation and treatment of pain involving the lower back and lower extremity region with pain in the region of the buttocks as well. Prior MRI studies reveal Degenerative disc disease lumbar spine Degenerative changes L4-5 disc desiccation, circumferential bulging and focal protrusion in the left neural foramen to extraforaminal region with facet and ligamentous hypertrophy, L5-S1 facet degeneration with no disc abnormalities. The patient is with reproduction of severe pain with palpation over the PSIS and PII S regions.   There is concern regarding a significant component of the patient's pain being due to sacroiliac joint dysfunction The risks, benefits, expectations of the procedure have been discussed and explained to the patient who is understanding and willing to proceed with interventional treatment in attempt to decrease severity of patient's symptoms, minimize the risk of medication escalation and  hopefully retard the progression of the patient's symptoms. We will proceed with what is felt to be a medically necessary procedure, block of nerves to the sacroiliac joint.   DESCRIPTION OF PROCEDURE:  Block of nerves to the sacroiliac joint.   The patient was taken to the fluoroscopy suite. With the patient in the prone position with EKG, blood pressure, pulse and pulse oximetry monitoring, IV Versed, IV fentanyl conscious sedation, Betadine prep of proposed entry site was performed.   Block of nerves at the L5 vertebral body level.   With the patient in prone position, under fluoroscopic guidance, a 22 -gauge needle was inserted at the L5 vertebral body level on the left side. With 15 degrees oblique orientation a 22 -gauge needle was inserted in the  region known as Burton's eye or eye of the Scotty dog. Following documentation of needle placement in the area of Burton's eye or eye of the Scotty dog under fluoroscopic guidance, needle placement was then accomplished at the sacral ala level on the left side.   Needle placement at the sacral ala.   With the patient in prone position under fluoroscopic guidance with AP view of the lumbosacral spine, a 22 -gauge needle was inserted in the region known as the sacral ala on the left side. Following documentation of needle placement on the left side under fluoroscopic guidance needle placement was then accomplished at the S1 foramen level.   Needle placement at the S1 foramen level.   With the patient in prone position under fluoroscopic guidance with AP view of the lumbosacral spine and cephalad orientation, a 22 -gauge needle was inserted at the superior and lateral border of the S1 foramen on the left side. Following documentation of needle placement at the S1 foramen level on the left side, needle placement was then accomplished at the S2 foramen level on the left side.   Needle placement at the S2 foramen level.   With the patient in prone position with AP view of the lumbosacral spine with cephalad orientation, a 22 - gauge needle was inserted at the superior and lateral border of the S2 foramen under fluoroscopic guidance on the left side. Following needle placement at the L5 vertebral body level, sacral ala, S1 foramen and S2 foramen on the left side, needle placement was verified on lateral view under fluoroscopic guidance.  Following needle placement documentation  on lateral view, each needle was injected with 1 mL of 0.25% bupivacaine and Kenalog.   BLOCK OF THE NERVES TO SACROILIAC JOINT ON THE RIGHT SIDE The procedure was performed on the right side at the same levels as was performed on the left side and utilizing the same technique as on the left side and was performed under fluoroscopic  guidance as on the left side   A total of '10mg'$  of Kenalog was utilized for the procedure.   PLAN:  1. Medications: The patient will continue presently prescribed medication Nucynta 2. The patient will be considered for modification of treatment regimen pending response to the procedure performed on today's visit.  3. The patient is to follow-up with primary care physician Dr. Jeananne Rama for evaluation of blood pressure and general medical condition following the procedure performed on today's visit.  4. Surgical evaluation as discussed. Has been addressed  5. Neurological evaluation as discussed. Has been addressed 6. F/U Dr. Kasandra Knudsen for psych evaluation as planned 7. The patient may be a candidate for radiofrequency procedures, implantation devices and other treatment pending response to treatment performed on today's visit and follow-up evaluation.  8. The patient has been advised to adhere to proper body mechanics and to avoid activities which may exacerbate the patient's symptoms.   Return appointment to Pain Management Center as scheduled.     Review of Systems     Objective:   Physical Exam        Assessment & Plan:

## 2015-11-11 NOTE — Patient Instructions (Addendum)
Continue present medication Nucynta and begin taking antibiotic Ceftin as prescribed. Please obtain your antibiotic today and begin taking antibiotic today  F/U PCP Dr. Jeananne Rama for evaliation of  BP and general medical  condition.   F/U surgical evaluation. May consider pending follow-up evaluation  F/U Dr.Su as planned   F/U neurological evaluation. May consider pending follow-up evaluations  May consider radiofrequency rhizolysis or intraspinal procedures pending response to present treatment and F/U evaluation.  Patient to call [facility] Center should patient have concerns prior to scheduled return appointment. Selective Nerve Root Block Patient Information  Description: Specific nerve roots exit the spinal canal and these nerves can be compressed and inflamed by a bulging disc and bone spurs.  By injecting steroids on the nerve root, we can potentially decrease the inflammation surrounding these nerves, which often leads to decreased pain.  Also, by injecting local anesthesia on the nerve root, this can provide [place] helpful information to give to your referring doctor if it decreases your pain.  Selective nerve root blocks can be done along the spine from the neck to the low back depending on the location of your pain.   After numbing the skin with local anesthesia, a small needle is passed to the nerve root and the position of the needle is verified using x-ray pictures.  After the needle is in correct position, we then deposit the medication.  You may experience a pressure sensation while this is being done.  The entire block usually lasts less than 15 minutes.  Conditions that may be treated with selective nerve root blocks:  Low back and leg pain  Spinal stenosis  Diagnostic block prior to potential surgery  Neck and arm pain  Post laminectomy syndrome  Preparation for the injection:  1. Do not eat any solid food or dairy products within 6 hours of your  appointment. 2. You may drink clear liquids up to 2 hours before an appointment.  Clear liquids include water, black coffee, juice or soda.  No milk or cream please. 3. You may take your regular medications, including pain medications, with a sip of water before your appointment.  Diabetics should hold regular insulin (if taken separately) and take 1/2 normal NPH dose the morning of the procedure.  Carry some sugar containing items with you to your appointment. 4. A driver must accompany you and be prepared to drive you home after your procedure. 5. Bring all your current medications with you. 6. An IV may be inserted and sedation may be given at the discretion of the physician. 7. A blood pressure cuff, EKG, and other monitors will often be applied during the procedure.  Some patients may need to have extra oxygen administered for a short period. 8. You will be asked to provide medical information, including allergies, prior to the procedure.  We must know immediately if you are taking blood  Thinners (like Coumadin) or if you are allergic to IV iodine contrast (dye).  Possible side-effects: All are usually temporary  Bleeding from needle site  Light headedness  Numbness and tingling  Decreased blood pressure  Weakness in arms/legs  Pressure sensation in back/neck  Pain at injection site (several days)  Possible complications: All are extremely rare  Infection  Nerve injury  Spinal headache (a headache wore with upright position)  Call if you experience:  Fever/chills associated with headache or increased back/neck pain  Headache worsened by an upright position  New onset weakness or numbness of an extremity below  the injection site  Hives or difficulty breathing (go to the emergency room)  Inflammation or drainage at the injection site(s)  Severe back/neck pain greater than usual  New symptoms which are concerning to you  Please note:  Although the local  anesthetic injected can often make your back or neck feel good for several hours after the injection the pain will likely return.  It takes 3-5 days for steroids to work on the nerve root. You may not notice any pain relief for at least one week.  If effective, we will often do a series of 3 injections spaced 3-6 weeks apart to maximally decrease your pain.    If you have any questions, please call [phone] [facility] Inst[facility]  General Discharge Instructions :  If you need to reach your doctor call: Monday-Friday 8:00 am - 4:00 pm at [phone] or toll free [phone].  After clinic hours [phone] to have operator reach doctor.  Bring all of your medication bottles to all your appointments in the pain clinic.  To cancel or reschedule your appointment with Pain Management please remember to call 24 hours in advance to avoid a fee.  Refer to the educational materials which you have been given on: General Risks, I had my Procedure. Discharge Instructions, Post Sedation.  Post Procedure Instructions:  The drugs you were given will stay in your system until tomorrow, so for the next 24 hours you should not drive, make any legal decisions or drink any alcoholic beverages.  You may eat anything you prefer, but it is better to start with liquids then soups and crackers, and gradually work up to solid foods.  Please notify your doctor immediately if you have any unusual bleeding, trouble breathing or pain that is not related to your normal pain.  Depending on the type of procedure that was done, some parts of your body may feel week and/or numb.  This usually clears up by tonight or the next day.  Walk with the use of an assistive device or accompanied by an adult for the 24 hours.  You may use ice on the affected area for the first 24 hours.  Put ice in a Ziploc bag and cover with a towel and place against area 15  minutes on 15 minutes off.  You may switch to heat after 24 hours.

## 2015-11-12 ENCOUNTER — Telehealth: Payer: Self-pay | Admitting: *Deleted

## 2015-11-12 NOTE — Telephone Encounter (Signed)
Message left

## 2015-11-15 ENCOUNTER — Other Ambulatory Visit: Payer: Self-pay | Admitting: Pain Medicine

## 2015-11-27 ENCOUNTER — Telehealth: Payer: Self-pay | Admitting: Pain Medicine

## 2015-11-27 NOTE — Telephone Encounter (Signed)
Patient called and states that her low back and legs are hurting. Wants a procedure. Denies any falls or trauma. No changes except increased pain. Dr. Primus Bravo please advise- Thank you                                                                                                                                                                                                                                                                                          Susan Houston

## 2015-11-27 NOTE — Telephone Encounter (Signed)
Having increased pain would like to come for procedure asap, can that be scheduled ?

## 2015-11-27 NOTE — Telephone Encounter (Signed)
Susan Houston and Nurses and Secretaries Please schedule patient for eval for Tuesday, February 14 at 7:30 AM I prefer to evaluate patient prior to deciding procedure

## 2015-11-28 NOTE — Telephone Encounter (Signed)
Per Dr. Primus Bravo, I scheduled Susan Houston for 12-03-15 @ 0730 for evaluation.

## 2015-11-28 NOTE — Telephone Encounter (Signed)
Blanch Media Please schedule patient for Tuesday, 12/03/2015

## 2015-11-28 NOTE — Telephone Encounter (Signed)
Patient already has appt. For 12-04-15. Is this ok to keep?

## 2015-12-03 ENCOUNTER — Ambulatory Visit: Payer: Medicare Other | Attending: Pain Medicine | Admitting: Pain Medicine

## 2015-12-03 ENCOUNTER — Encounter: Payer: Self-pay | Admitting: Pain Medicine

## 2015-12-03 VITALS — BP 107/68 | HR 57 | Temp 97.9°F | Resp 16 | Ht 63.0 in | Wt 186.0 lb

## 2015-12-03 DIAGNOSIS — M51379 Other intervertebral disc degeneration, lumbosacral region without mention of lumbar back pain or lower extremity pain: Secondary | ICD-10-CM

## 2015-12-03 DIAGNOSIS — M5126 Other intervertebral disc displacement, lumbar region: Secondary | ICD-10-CM | POA: Diagnosis not present

## 2015-12-03 DIAGNOSIS — M47817 Spondylosis without myelopathy or radiculopathy, lumbosacral region: Secondary | ICD-10-CM | POA: Diagnosis not present

## 2015-12-03 DIAGNOSIS — M533 Sacrococcygeal disorders, not elsewhere classified: Secondary | ICD-10-CM

## 2015-12-03 DIAGNOSIS — M51369 Other intervertebral disc degeneration, lumbar region without mention of lumbar back pain or lower extremity pain: Secondary | ICD-10-CM

## 2015-12-03 DIAGNOSIS — M5136 Other intervertebral disc degeneration, lumbar region: Secondary | ICD-10-CM | POA: Insufficient documentation

## 2015-12-03 DIAGNOSIS — M47816 Spondylosis without myelopathy or radiculopathy, lumbar region: Secondary | ICD-10-CM

## 2015-12-03 DIAGNOSIS — M545 Low back pain: Secondary | ICD-10-CM | POA: Diagnosis present

## 2015-12-03 DIAGNOSIS — M5137 Other intervertebral disc degeneration, lumbosacral region: Secondary | ICD-10-CM

## 2015-12-03 DIAGNOSIS — M791 Myalgia: Secondary | ICD-10-CM | POA: Diagnosis not present

## 2015-12-03 DIAGNOSIS — M79606 Pain in leg, unspecified: Secondary | ICD-10-CM | POA: Diagnosis present

## 2015-12-03 DIAGNOSIS — M5416 Radiculopathy, lumbar region: Secondary | ICD-10-CM | POA: Diagnosis not present

## 2015-12-03 MED ORDER — TAPENTADOL HCL 50 MG PO TABS: ORAL_TABLET | ORAL | Status: DC

## 2015-12-03 NOTE — Progress Notes (Signed)
Patient here for medication refill.  Patient states she has had one fall since she was here last.  Upon rising she became "wobbly" and fell in the living room. Denies injury, states that this has happened before, feeling lightheaded.  Safety precautions to be maintained throughout the outpatient stay will include: orient to surroundings, keep bed in low position, maintain call bell within reach at all times, provide assistance with transfer out of bed and ambulation.

## 2015-12-03 NOTE — Patient Instructions (Addendum)
PLAN  Continue present medication Nucynta   Lumbar facet, medial branch nerve, blocks to be performed at time of return appointment  F/U PCP Dr. Jeananne Rama for evaliation of  BP and general medical  condition.   F/U surgical evaluation. May consider pending follow-up evaluation  F/U Dr.Su as planned   F/U neurological evaluation. May consider PNCV/EMG studies and other studies pending follow-up evaluations  Radiofrequency rhizolysis lumbar facet medial branch nerves to be performed pending insurance approval  Patient to call Pain Management Center should patient have concerns prior to scheduled return appointment.Facet Blocks Patient Information  Description: The facets are joints in the spine between the vertebrae.  Like any joints in the body, facets can become irritated and painful.  Arthritis can also effect the facets.  By injecting steroids and local anesthetic in and around these joints, we can temporarily block the nerve supply to them.  Steroids act directly on irritated nerves and tissues to reduce selling and inflammation which often leads to decreased pain.  Facet blocks may be done anywhere along the spine from the neck to the low back depending upon the location of your pain.   After numbing the skin with local anesthetic (like Novocaine), a small needle is passed onto the facet joints under x-ray guidance.  You may experience a sensation of pressure while this is being done.  The entire block usually lasts about 15-25 minutes.   Conditions which may be treated by facet blocks:   Low back/buttock pain  Neck/shoulder pain  Certain types of headaches  Preparation for the injection:  1. Do not eat any solid food or dairy products within 6 hours of your appointment. 2. You may drink clear liquid up to 2 hours before appointment.  Clear liquids include water, black coffee, juice or soda.  No milk or cream please. 3. You may take your regular medication, including pain  medications, with a sip of water before your appointment.  Diabetics should hold regular insulin (if taken separately) and take 1/2 normal NPH dose the morning of the procedure.  Carry some sugar containing items with you to your appointment. 4. A driver must accompany you and be prepared to drive you home after your procedure. 5. Bring all your current medications with you. 6. An IV may be inserted and sedation may be given at the discretion of the physician. 7. A blood pressure cuff, EKG and other monitors will often be applied during the procedure.  Some patients may need to have extra oxygen administered for a short period. 8. You will be asked to provide medical information, including your allergies and medications, prior to the procedure.  We must know immediately if you are taking blood thinners (like Coumadin/Warfarin) or if you are allergic to IV iodine contrast (dye).  We must know if you could possible be pregnant.  Possible side-effects:   Bleeding from needle site  Infection (rare, may require surgery)  Nerve injury (rare)  Numbness & tingling (temporary)  Difficulty urinating (rare, temporary)  Spinal headache (a headache worse with upright posture)  Light-headedness (temporary)  Pain at injection site (serveral days)  Decreased blood pressure (rare, temporary)  Weakness in arm/leg (temporary)  Pressure sensation in back/neck (temporary)   Call if you experience:   Fever/chills associated with headache or increased back/neck pain  Headache worsened by an upright position  New onset, weakness or numbness of an extremity below the injection site  Hives or difficulty breathing (go to the emergency room)  Inflammation  or drainage at the injection site(s)  Severe back/neck pain greater than usual  New symptoms which are concerning to you  Please note:  Although the local anesthetic injected can often make your back or neck feel good for several hours after  the injection, the pain will likely return. It takes 3-7 days for steroids to work.  You may not notice any pain relief for at least one week.  If effective, we will often do a series of 2-3 injections spaced 3-6 weeks apart to maximally decrease your pain.  After the initial series, you may be a candidate for a more permanent nerve block of the facets.  If you have any questions, please call #[phone] [place]  What are the risk, side effects and possible complications? Generally speaking, most procedures are safe.  However, with any procedure there are risks, side effects, and the possibility of complications.  The risks and complications are dependent upon the sites that are lesioned, or the type of nerve block to be performed.  The closer the procedure is to the spine, the more serious the risks are.  Great care is taken when placing the radio frequency needles, block needles or lesioning probes, but sometimes complications can occur. 1. Infection: Any time there is an injection through the skin, there is a risk of infection.  This is why sterile conditions are used for these blocks.  There are four possible types of infection. 1. Localized skin infection. 2. Central Nervous System Infection-This can be in the form of Meningitis, which can be deadly. 3. Epidural Infections-This can be in the form of an epidural abscess, which can cause pressure inside of the spine, causing compression of the spinal cord with subsequent paralysis. This would require an emergency surgery to decompress, and there are no guarantees that the patient would recover from the paralysis. 4. Discitis-This is an infection of the intervertebral discs.  It occurs in about 1% of discography procedures.  It is difficult to treat and it may lead to surgery.        2. Pain: the needles have to go through skin and soft tissues, will cause soreness.        3. Damage to internal structures:  The nerves to be lesioned may be near blood vessels or    other nerves which can be potentially damaged.       4. Bleeding: Bleeding is more common if the patient is taking blood thinners such as  aspirin, Coumadin, Ticiid, Plavix, etc., or if he/she have some genetic predisposition  such as hemophilia. Bleeding into the spinal canal can cause compression of the spinal  cord with subsequent paralysis.  This would require an emergency surgery to  decompress and there are no guarantees that the patient would recover from the  paralysis.       5. Pneumothorax:  Puncturing of a lung is a possibility, every time a needle is introduced in  the area of the chest or upper back.  Pneumothorax refers to free air around the  collapsed lung(s), inside of the thoracic cavity (chest cavity).  Another two possible  complications related to a similar event would include: Hemothorax and Chylothorax.   These are variations of the Pneumothorax, where instead of air around the collapsed  lung(s), you may have blood or chyle, respectively.       6. Spinal headaches: They may occur with any procedures in the area of the  spine.       7. Persistent CSF (Cerebro-Spinal Fluid) leakage: This is a rare problem, but may occur  with prolonged intrathecal or epidural catheters either due to the formation of a fistulous  track or a dural tear.       8. Nerve damage: By working so close to the spinal cord, there is always a possibility of  nerve damage, which could be as serious as a permanent spinal cord injury with  paralysis.       9. Death:  Although rare, severe deadly allergic reactions known as "Anaphylactic  reaction" can occur to any of the medications used.      10. Worsening of the symptoms:  We can always make thing worse.  What are the chances of something like this happening? Chances of any of this occuring are extremely low.  By statistics, you have more of a chance of getting killed in a  motor vehicle accident: while driving to the hospital than any of the above occurring .  Nevertheless, you should be aware that they are possibilities.  In general, it is similar to taking a shower.  Everybody knows that you can slip, hit your head and get killed.  Does that mean that you should not shower again?  Nevertheless always keep in mind that statistics do not mean anything if you happen to be on the wrong side of them.  Even if a procedure has a 1 (one) in a 1,000,000 (million) chance of going wrong, it you happen to be that one..Also, keep in mind that by statistics, you have more of a chance of having something go wrong when taking medications.  Who should not have this procedure? If you are on a blood thinning medication (e.g. Coumadin, Plavix, see list of "Blood Thinners"), or if you have an active infection going on, you should not have the procedure.  If you are taking any blood thinners, please inform your physician.  How should I prepare for this procedure?  Do not eat or drink anything at least six hours prior to the procedure.  Bring a driver with you .  It cannot be a taxi.  Come accompanied by an adult that can drive you back, and that is strong enough to help you if your legs get weak or numb from the local anesthetic.  Take all of your medicines the morning of the procedure with just enough water to swallow them.  If you have diabetes, make sure that you are scheduled to have your procedure done first thing in the morning, whenever possible.  If you have diabetes, take only half of your insulin dose and notify our nurse that you have done so as soon as you arrive at the clinic.  If you are diabetic, but only take blood sugar pills (oral hypoglycemic), then do not take them on the morning of your procedure.  You may take them after you have had the procedure.  Do not take aspirin or any aspirin-containing medications, at least eleven (11) days prior to the procedure.  They  may prolong bleeding.  Wear loose fitting clothing that may be easy to take off and that you would not mind if it got stained with Betadine or blood.  Do not wear any jewelry or perfume  Remove any nail coloring.  It will interfere with some of our monitoring equipment.  NOTE: Remember that this is not meant to be interpreted as a complete list of all possible complications.  Unforeseen problems may occur.  BLOOD THINNERS The following drugs contain aspirin or other products, which can cause increased bleeding during surgery and should not be taken for 2 weeks prior to and 1 week after surgery.  If you should need take something for relief of minor pain, you may take acetaminophen which is found in Tylenol,m Datril, Anacin-3 and Panadol. It is not blood thinner. The products listed below are.  Do not take any of the products listed below in addition to any listed on your instruction sheet.  A.P.C or A.P.C with Codeine Codeine Phosphate Capsules #3 Ibuprofen Ridaura  ABC compound Congesprin Imuran rimadil  Advil Cope Indocin Robaxisal  Alka-Seltzer Effervescent Pain Reliever and Antacid Coricidin or Coricidin-D  Indomethacin Rufen  Alka-Seltzer plus Cold Medicine Cosprin Ketoprofen S-A-C Tablets  Anacin Analgesic Tablets or Capsules Coumadin Korlgesic Salflex  Anacin Extra Strength Analgesic tablets or capsules CP-2 Tablets Lanoril Salicylate  Anaprox Cuprimine Capsules Levenox Salocol  Anexsia-D Dalteparin Magan Salsalate  Anodynos Darvon compound Magnesium Salicylate Sine-off  Ansaid Dasin Capsules Magsal Sodium Salicylate  Anturane Depen Capsules Marnal Soma  APF Arthritis pain formula Dewitt's Pills Measurin Stanback  Argesic Dia-Gesic Meclofenamic Sulfinpyrazone  Arthritis Bayer Timed Release Aspirin Diclofenac Meclomen Sulindac  Arthritis pain formula Anacin Dicumarol Medipren Supac  Analgesic (Safety coated) Arthralgen Diffunasal Mefanamic Suprofen  Arthritis Strength Bufferin  Dihydrocodeine Mepro Compound Suprol  Arthropan liquid Dopirydamole Methcarbomol with Aspirin Synalgos  ASA tablets/Enseals Disalcid Micrainin Tagament  Ascriptin Doan's Midol Talwin  Ascriptin A/D Dolene Mobidin Tanderil  Ascriptin Extra Strength Dolobid Moblgesic Ticlid  Ascriptin with Codeine Doloprin or Doloprin with Codeine Momentum Tolectin  Asperbuf Duoprin Mono-gesic Trendar  Aspergum Duradyne Motrin or Motrin IB Triminicin  Aspirin plain, buffered or enteric coated Durasal Myochrisine Trigesic  Aspirin Suppositories Easprin Nalfon Trillsate  Aspirin with Codeine Ecotrin Regular or Extra Strength Naprosyn Uracel  Atromid-S Efficin Naproxen Ursinus  Auranofin Capsules Elmiron Neocylate Vanquish  Axotal Emagrin Norgesic Verin  Azathioprine Empirin or Empirin with Codeine Normiflo Vitamin E  Azolid Emprazil Nuprin Voltaren  Bayer Aspirin plain, buffered or children's or timed BC Tablets or powders Encaprin Orgaran Warfarin Sodium  Buff-a-Comp Enoxaparin Orudis Zorpin  Buff-a-Comp with Codeine Equegesic Os-Cal-Gesic   Buffaprin Excedrin plain, buffered or Extra Strength Oxalid   Bufferin Arthritis Strength Feldene Oxphenbutazone   Bufferin plain or Extra Strength Feldene Capsules Oxycodone with Aspirin   Bufferin with Codeine Fenoprofen Fenoprofen Pabalate or Pabalate-SF   Buffets II Flogesic Panagesic   Buffinol plain or Extra Strength Florinal or Florinal with Codeine Panwarfarin   Buf-Tabs Flurbiprofen Penicillamine   Butalbital Compound Four-way cold tablets Penicillin   Butazolidin Fragmin Pepto-Bismol   Carbenicillin Geminisyn Percodan   Carna Arthritis Reliever Geopen Persantine   Carprofen Gold's salt Persistin   Chloramphenicol Goody's Phenylbutazone   Chloromycetin Haltrain Piroxlcam   Clmetidine heparin Plaquenil   Cllnoril Hyco-pap Ponstel   Clofibrate Hydroxy chloroquine Propoxyphen         Before stopping any of these medications, be sure to consult the  physician who ordered them.  Some, such as Coumadin (Warfarin) are ordered to prevent or treat serious conditions such as "deep thrombosis", "pumonary embolisms", and other heart problems.  The amount of time that you may need off of the medication may also vary with the medication and the reason for which you were taking it.  If you are taking any of these medications, please make sure you notify your pain physician before you undergo any procedures.

## 2015-12-03 NOTE — Progress Notes (Signed)
   Subjective:    Patient ID: Susan Houston, female    DOB: [birth year], 55 y.o.   MRN: [id number]  HPI  The patient is a 55 year old female who returns to pain management for further evaluation treatment of pain involving the lower back and lower extremity regions predominantly. The patient states that the pain in the lower back region is aggravated by prolonged standing and walking. The patient is unable to stand erect due to the increase of pain when patient attempted to stand erect. On today's visit the patient was a severe increased pain with palpation over the lumbar facets especially with rotation and lateral bending and extension with palpation over the lumbar facets reproducing severe disabling pain. We'll proceed with lumbar facet, medial branch nerve, blocks at time return appointment in attempt to decrease severity of patient's symptoms and minimize progression of symptoms. The patient was in agreement with suggested treatment plan. The patient continues in the center without any undesirable side effects. We discuss surgical evaluation and present time patient prefers to avoid further surgical evaluation. We will proceed with lumbar facet, medial branch nerve, blocks at time return appointment as discussed and as explained to patient who was in agreement with suggested treatment plan. Patient will continue Nucynta as prescribed at this time               Review of Systems     Objective:   Physical Exam  There was tenderness to palpation of the paraspinal musculature in the cervical region cervical facet region a mild degree with mild tenderness of the splenius capitis and occipitalis musculature regions. Palpation of the acromioclavicular and glenohumeral joint regions reproduce mild discomfort and patient appeared to be with unremarkable Spurling's maneuver. Tinel and Phalen's maneuver were without increase of pain of significant degree. The patient appeared to be with bilaterally  equal grip strength. The patient stated forward flexion position. Extension lateral bending rotation and palpation of the lumbar facets reproduce severe disabling pain. There was moderate tenderness to palpation over the thoracic paraspinal misreading thoracic facet region with moderate muscle spasms noted especially in the lower thoracic paraspinal musculature region. No crepitus of the thoracic region noted. Palpation over the PSIS and PII S region reproduces moderate discomfort. There was mild tinnitus of the greater trochanteric region and iliotibial band region. Straight leg raise was tolerates approximately 20 without increased pain with dorsiflexion noted. DTRs were difficult to elicit patient had difficulty relaxing. No definite sensory deficit or dermatomal distribution detected. There was negative clonus negative Homans. Abdomen was nontender with no costovertebral tenderness noted      Assessment & Plan:     Degenerative disc disease lumbar spine Degenerative changes L4-5 disc desiccation, circumferential bulging and focal protrusion in the left neural foramen to extraforaminal region with facet and ligamentous hypertrophy, L5-S1 facet degeneration with no disc abnormalities.  Lumbar facet syndrome  Sacroiliac joint dysfunction    PLAN  Continue present medication Nucynta   Lumbar facet, medial branch nerve, blocks to be performed at time of return appointment  F/U PCP Dr. Jeananne Rama for evaliation of  BP and general medical  condition.   F/U surgical evaluation. May consider pending follow-up evaluation  F/U Dr.Su as planned   F/U neurological evaluation. May consider PNCV/EMG studies and other studies pending follow-up evaluations  Radiofrequency rhizolysis lumbar facet medial branch nerves to be performed pending insurance approval  Patient to call [facility] should patient have concerns prior to scheduled return appointment

## 2015-12-04 ENCOUNTER — Ambulatory Visit: Payer: Self-pay | Admitting: Pain Medicine

## 2015-12-11 ENCOUNTER — Encounter: Payer: Self-pay | Admitting: Pain Medicine

## 2015-12-11 ENCOUNTER — Ambulatory Visit: Payer: Medicare Other | Attending: Pain Medicine | Admitting: Pain Medicine

## 2015-12-11 VITALS — BP 113/51 | HR 85 | Temp 97.7°F | Resp 16 | Ht 63.0 in | Wt 185.0 lb

## 2015-12-11 DIAGNOSIS — M5136 Other intervertebral disc degeneration, lumbar region: Secondary | ICD-10-CM | POA: Insufficient documentation

## 2015-12-11 DIAGNOSIS — M51379 Other intervertebral disc degeneration, lumbosacral region without mention of lumbar back pain or lower extremity pain: Secondary | ICD-10-CM

## 2015-12-11 DIAGNOSIS — M545 Low back pain: Secondary | ICD-10-CM | POA: Insufficient documentation

## 2015-12-11 DIAGNOSIS — M47817 Spondylosis without myelopathy or radiculopathy, lumbosacral region: Secondary | ICD-10-CM | POA: Diagnosis not present

## 2015-12-11 DIAGNOSIS — M5137 Other intervertebral disc degeneration, lumbosacral region: Secondary | ICD-10-CM

## 2015-12-11 DIAGNOSIS — M533 Sacrococcygeal disorders, not elsewhere classified: Secondary | ICD-10-CM

## 2015-12-11 DIAGNOSIS — M79606 Pain in leg, unspecified: Secondary | ICD-10-CM | POA: Diagnosis present

## 2015-12-11 DIAGNOSIS — M47816 Spondylosis without myelopathy or radiculopathy, lumbar region: Secondary | ICD-10-CM

## 2015-12-11 DIAGNOSIS — M51369 Other intervertebral disc degeneration, lumbar region without mention of lumbar back pain or lower extremity pain: Secondary | ICD-10-CM

## 2015-12-11 MED ORDER — CEFAZOLIN SODIUM 1 G IJ SOLR
INTRAMUSCULAR | Status: AC
  Administered 2015-12-11: 1 g via INTRAVENOUS
  Filled 2015-12-11: qty 10

## 2015-12-11 MED ORDER — CEFUROXIME AXETIL 250 MG PO TABS: 250.0000 mg | ORAL_TABLET | Freq: Two times a day (BID) | ORAL | Status: DC

## 2015-12-11 MED ORDER — BUPIVACAINE HCL (PF) 0.25 % IJ SOLN
INTRAMUSCULAR | Status: AC
  Administered 2015-12-11: 09:00:00
  Filled 2015-12-11: qty 30

## 2015-12-11 MED ORDER — ORPHENADRINE CITRATE 30 MG/ML IJ SOLN: 60.0000 mg | Freq: Once | INTRAMUSCULAR | Status: DC

## 2015-12-11 MED ORDER — ORPHENADRINE CITRATE 30 MG/ML IJ SOLN
INTRAMUSCULAR | Status: AC
  Filled 2015-12-11: qty 2

## 2015-12-11 MED ORDER — MIDAZOLAM HCL 5 MG/5ML IJ SOLN
5.0000 mg | Freq: Once | INTRAMUSCULAR | Status: DC
Start: 2015-12-11 — End: 2016-11-20

## 2015-12-11 MED ORDER — LACTATED RINGERS IV SOLN: 1000.0000 mL | INTRAVENOUS | Status: DC

## 2015-12-11 MED ORDER — TRIAMCINOLONE ACETONIDE 40 MG/ML IJ SUSP
INTRAMUSCULAR | Status: AC
  Administered 2015-12-11: 09:00:00
  Filled 2015-12-11: qty 1

## 2015-12-11 MED ORDER — CEFAZOLIN SODIUM 1-5 GM-% IV SOLN: 1.0000 g | Freq: Once | INTRAVENOUS | Status: DC

## 2015-12-11 MED ORDER — FENTANYL CITRATE (PF) 100 MCG/2ML IJ SOLN
INTRAMUSCULAR | Status: AC
  Administered 2015-12-11: 100 ug via INTRAVENOUS
  Filled 2015-12-11: qty 2

## 2015-12-11 MED ORDER — MIDAZOLAM HCL 5 MG/5ML IJ SOLN
INTRAMUSCULAR | Status: AC
  Administered 2015-12-11: 3 mg via INTRAVENOUS
  Filled 2015-12-11: qty 5

## 2015-12-11 MED ORDER — BUPIVACAINE HCL (PF) 0.25 % IJ SOLN: 30.0000 mL | Freq: Once | INTRAMUSCULAR | Status: DC

## 2015-12-11 MED ORDER — FENTANYL CITRATE (PF) 100 MCG/2ML IJ SOLN: 100.0000 ug | Freq: Once | INTRAMUSCULAR | Status: DC

## 2015-12-11 MED ORDER — TRIAMCINOLONE ACETONIDE 40 MG/ML IJ SUSP: 40.0000 mg | Freq: Once | INTRAMUSCULAR | Status: DC

## 2015-12-11 NOTE — Progress Notes (Signed)
Subjective:    Patient ID: Susan Houston, female    DOB: July 27, 1961, 55 y.o.   MRN: [id number]  HPI PROCEDURE PERFORMED: Lumbar facet (medial branch block)   NOTE: The patient is a 55 y.o. female who returns to [facility] for further evaluation and treatment of pain involving the lumbar and lower extremity region. MRI  revealed the patient to be with evidence of degenerative disc disease lumbar spine Degenerative changes L4-5 disc desiccation, circumferential bulging and focal protrusion in the left neural foramen to extraforaminal region with facet and ligamentous hypertrophy, L5-S1 facet degeneration with no disc abnormalities.. There is concern regarding significant component of patient's pain being due to lumbar facet syndrome The risks, benefits, and expectations of the procedure have been discussed and explained to the patient who was understanding and in agreement with suggested treatment plan. We will proceed with interventional treatment as discussed and as explained to the patient who was understanding and wished to proceed with procedure as planned.   DESCRIPTION OF PROCEDURE: Lumbar facet (medial branch block) with IV Versed, IV fentanyl conscious sedation, EKG, blood pressure, pulse, and pulse oximetry monitoring. The procedure was performed with the patient in the prone position. Betadine prep of proposed entry site performed.   NEEDLE PLACEMENT AT: Left L 2 lumbar facet (medial branch block). Under fluoroscopic guidance with oblique orientation of 15 degrees, a 22-gauge needle was inserted at the L 2 vertebral body level with needle placed at the targeted area of Burton's Eye or Eye of the Scotty Dog with documentation of needle placement in the superior and lateral border of targeted area of Burton's Eye or Eye of the Scotty Dog with oblique orientation of 15 degrees. Following documentation of needle placement at the L 2 vertebral body level, needle placement was then  accomplished at the L 3 vertebral body level.   NEEDLE PLACEMENT AT L3, L4, and L5 VERTEBRAL BODY LEVELS ON THE LEFT SIDE The procedure was performed at the L3, L4, and L5 vertebral body levels exactly as was performed at the L 2 vertebral body level utilizing the same technique and under fluoroscopic guidance.  NEEDLE PLACEMENT AT THE SACRAL ALA with AP view of the lumbosacral spine. With the patient in the prone position, Betadine prep of proposed entry site accomplished, a 22 gauge needle was inserted in the region of the sacral ala (groove formed by the superior articulating process of S1 and the sacral wing). Following documentation of needle placement at the sacral ala,     Needle placement was then verified at all levels on lateral view. Following documentation of needle placement at all levels on lateral view and following negative aspiration for heme and CSF, each level was injected with 1 mL of 0.25% bupivacaine with Kenalog.     LUMBAR FACET, MEDIAL BRANCH NERVE, BLOCKS PERFORMED ON THE RIGHT SIDE   The procedure was performed on the right side exactly as was performed on the left side at the same levels and utilizing the same technique under fluoroscopic guidance.     The patient tolerated the procedure well. A total of 40 mg of Kenalog was utilized for the procedure.   PLAN:  1. Medications: The patient will continue presently prescribed medication Nucynta extended release 2. May consider modification of treatment regimen at time of return appointment pending response to treatment rendered on today's visit. 3. The patient is to follow-up with primary care physician Dr. Jeananne Rama for further evaluation of blood pressure and general medical  condition status post steroid injection performed on today's visit. 4. Surgical follow-up evaluation. Has been addressed 5. Neurological follow-up evaluation. Has been addressed 6. The patient may be candidate for radiofrequency procedures,  implantation type procedures, and other treatment pending response to treatment and follow-up evaluation. 7. The patient has been advised to call the Pain Management Center prior to scheduled return appointment should there be significant change in condition or should patient have other concerns regarding condition prior to scheduled return appointment.  The patient is understanding and in agreement with suggested treatment plan.    Review of Systems     Objective:   Physical Exam        Assessment & Plan:

## 2015-12-11 NOTE — Patient Instructions (Addendum)
PLAN  Continue present medication Nucynta and begin taking antibiotic Ceftin as prescribed. Please obtain your antibiotic today and begin taking antibiotic today  F/U PCP Dr. Jeananne Rama for evaliation of  BP and general medical  condition.   F/U surgical evaluation. May consider pending follow-up evaluation  F/U Dr.Su as planned   F/U neurological evaluation. May consider pending follow-up evaluations  May consider radiofrequency rhizolysis or intraspinal procedures pending response to present treatment and F/U evaluation.  Patient to call Pain Management Center should patient have concerns prior to scheduled return appointment. GENERAL RISKS AND COMPLICATIONS  What are the risk, side effects and possible complications? Generally speaking, most procedures are safe.  However, with any procedure there are risks, side effects, and the possibility of complications.  The risks and complications are dependent upon the sites that are lesioned, or the type of nerve block to be performed.  The closer the procedure is to the spine, the more serious the risks are.  Great care is taken when placing the radio frequency needles, block needles or lesioning probes, but sometimes complications can occur. 1. Infection: Any time there is an injection through the skin, there is a risk of infection.  This is why sterile conditions are used for these blocks.  There are four possible types of infection. 1. Localized skin infection. 2. Central Nervous System Infection-This can be in the form of Meningitis, which can be deadly. 3. Epidural Infections-This can be in the form of an epidural abscess, which can cause pressure inside of the spine, causing compression of the spinal cord with subsequent paralysis. This would require an emergency surgery to decompress, and there are no guarantees that the patient would recover from the paralysis. 4. Discitis-This is an infection of the intervertebral discs.  It occurs in about  1% of discography procedures.  It is difficult to treat and it may lead to surgery.        2. Pain: the needles have to go through skin and soft tissues, will cause soreness.       3. Damage to internal structures:  The nerves to be lesioned may be near blood vessels or    other nerves which can be potentially damaged.       4. Bleeding: Bleeding is more common if the patient is taking blood thinners such as  aspirin, Coumadin, Ticiid, Plavix, etc., or if he/she have some genetic predisposition  such as hemophilia. Bleeding into the spinal canal can cause compression of the spinal  cord with subsequent paralysis.  This would require an emergency surgery to  decompress and there are no guarantees that the patient would recover from the  paralysis.       5. Pneumothorax:  Puncturing of a lung is a possibility, every time a needle is introduced in  the area of the chest or upper back.  Pneumothorax refers to free air around the  collapsed lung(s), inside of the thoracic cavity (chest cavity).  Another two possible  complications related to a similar event would include: Hemothorax and Chylothorax.   These are variations of the Pneumothorax, where instead of air around the collapsed  lung(s), you may have blood or chyle, respectively.       6. Spinal headaches: They may occur with any procedures in the area of the spine.       7. Persistent CSF (Cerebro-Spinal Fluid) leakage: This is a rare problem, but may occur  with prolonged intrathecal or epidural catheters either due to the  formation of a fistulous  track or a dural tear.       8. Nerve damage: By working so close to the spinal cord, there is always a possibility of  nerve damage, which could be as serious as a permanent spinal cord injury with  paralysis.       9. Death:  Although rare, severe deadly allergic reactions known as "Anaphylactic  reaction" can occur to any of the medications used.      10. Worsening of the symptoms:  We can always make  thing worse.  What are the chances of something like this happening? Chances of any of this occuring are extremely low.  By statistics, you have more of a chance of getting killed in a motor vehicle accident: while driving to the hospital than any of the above occurring .  Nevertheless, you should be aware that they are possibilities.  In general, it is similar to taking a shower.  Everybody knows that you can slip, hit your head and get killed.  Does that mean that you should not shower again?  Nevertheless always keep in mind that statistics do not mean anything if you happen to be on the wrong side of them.  Even if a procedure has a 1 (one) in a 1,000,000 (million) chance of going wrong, it you happen to be that one..Also, keep in mind that by statistics, you have more of a chance of having something go wrong when taking medications.  Who should not have this procedure? If you are on a blood thinning medication (e.g. Coumadin, Plavix, see list of "Blood Thinners"), or if you have an active infection going on, you should not have the procedure.  If you are taking any blood thinners, please inform your physician.  How should I prepare for this procedure?  Do not eat or drink anything at least six hours prior to the procedure.  Bring a driver with you .  It cannot be a taxi.  Come accompanied by an adult that can drive you back, and that is strong enough to help you if your legs get weak or numb from the local anesthetic.  Take all of your medicines the morning of the procedure with just enough water to swallow them.  If you have diabetes, make sure that you are scheduled to have your procedure done first thing in the morning, whenever possible.  If you have diabetes, take only half of your insulin dose and notify our nurse that you have done so as soon as you arrive at the clinic.  If you are diabetic, but only take blood sugar pills (oral hypoglycemic), then do not take them on the morning  of your procedure.  You may take them after you have had the procedure.  Do not take aspirin or any aspirin-containing medications, at least eleven (11) days prior to the procedure.  They may prolong bleeding.  Wear loose fitting clothing that may be easy to take off and that you would not mind if it got stained with Betadine or blood.  Do not wear any jewelry or perfume  Remove any nail coloring.  It will interfere with some of our monitoring equipment.  NOTE: Remember that this is not meant to be interpreted as a complete list of all possible complications.  Unforeseen problems may occur.  BLOOD THINNERS The following drugs contain aspirin or other products, which can cause increased bleeding during surgery and should not be taken for 2 weeks prior to  and 1 week after surgery.  If you should need take something for relief of minor pain, you may take acetaminophen which is found in Tylenol,m Datril, Anacin-3 and Panadol. It is not blood thinner. The products listed below are.  Do not take any of the products listed below in addition to any listed on your instruction sheet.  A.P.C or A.P.C with Codeine Codeine Phosphate Capsules #3 Ibuprofen Ridaura  ABC compound Congesprin Imuran rimadil  Advil Cope Indocin Robaxisal  Alka-Seltzer Effervescent Pain Reliever and Antacid Coricidin or Coricidin-D  Indomethacin Rufen  Alka-Seltzer plus Cold Medicine Cosprin Ketoprofen S-A-C Tablets  Anacin Analgesic Tablets or Capsules Coumadin Korlgesic Salflex  Anacin Extra Strength Analgesic tablets or capsules CP-2 Tablets Lanoril Salicylate  Anaprox Cuprimine Capsules Levenox Salocol  Anexsia-D Dalteparin Magan Salsalate  Anodynos Darvon compound Magnesium Salicylate Sine-off  Ansaid Dasin Capsules Magsal Sodium Salicylate  Anturane Depen Capsules Marnal Soma  APF Arthritis pain formula Dewitt's Pills Measurin Stanback  Argesic Dia-Gesic Meclofenamic Sulfinpyrazone  Arthritis Bayer Timed Release  Aspirin Diclofenac Meclomen Sulindac  Arthritis pain formula Anacin Dicumarol Medipren Supac  Analgesic (Safety coated) Arthralgen Diffunasal Mefanamic Suprofen  Arthritis Strength Bufferin Dihydrocodeine Mepro Compound Suprol  Arthropan liquid Dopirydamole Methcarbomol with Aspirin Synalgos  ASA tablets/Enseals Disalcid Micrainin Tagament  Ascriptin Doan's Midol Talwin  Ascriptin A/D Dolene Mobidin Tanderil  Ascriptin Extra Strength Dolobid Moblgesic Ticlid  Ascriptin with Codeine Doloprin or Doloprin with Codeine Momentum Tolectin  Asperbuf Duoprin Mono-gesic Trendar  Aspergum Duradyne Motrin or Motrin IB Triminicin  Aspirin plain, buffered or enteric coated Durasal Myochrisine Trigesic  Aspirin Suppositories Easprin Nalfon Trillsate  Aspirin with Codeine Ecotrin Regular or Extra Strength Naprosyn Uracel  Atromid-S Efficin Naproxen Ursinus  Auranofin Capsules Elmiron Neocylate Vanquish  Axotal Emagrin Norgesic Verin  Azathioprine Empirin or Empirin with Codeine Normiflo Vitamin E  Azolid Emprazil Nuprin Voltaren  Bayer Aspirin plain, buffered or children's or timed BC Tablets or powders Encaprin Orgaran Warfarin Sodium  Buff-a-Comp Enoxaparin Orudis Zorpin  Buff-a-Comp with Codeine Equegesic Os-Cal-Gesic   Buffaprin Excedrin plain, buffered or Extra Strength Oxalid   Bufferin Arthritis Strength Feldene Oxphenbutazone   Bufferin plain or Extra Strength Feldene Capsules Oxycodone with Aspirin   Bufferin with Codeine Fenoprofen Fenoprofen Pabalate or Pabalate-SF   Buffets II Flogesic Panagesic   Buffinol plain or Extra Strength Florinal or Florinal with Codeine Panwarfarin   Buf-Tabs Flurbiprofen Penicillamine   Butalbital Compound Four-way cold tablets Penicillin   Butazolidin Fragmin Pepto-Bismol   Carbenicillin Geminisyn Percodan   Carna Arthritis Reliever Geopen Persantine   Carprofen Gold's salt Persistin   Chloramphenicol Goody's Phenylbutazone   Chloromycetin Haltrain  Piroxlcam   Clmetidine heparin Plaquenil   Cllnoril Hyco-pap Ponstel   Clofibrate Hydroxy chloroquine Propoxyphen         Before stopping any of these medications, be sure to consult the physician who ordered them.  Some, such as Coumadin (Warfarin) are ordered to prevent or treat serious conditions such as "deep thrombosis", "pumonary embolisms", and other heart problems.  The amount of time that you may need off of the medication may also vary with the medication and the reason for which you were taking it.  If you are taking any of these medications, please make sure you notify your pain physician before you undergo any procedures.

## 2015-12-12 ENCOUNTER — Telehealth: Payer: Self-pay | Admitting: *Deleted

## 2015-12-12 NOTE — Telephone Encounter (Signed)
Left voicemail for patient to call our office if she has any questions or concerns re;procedure on yesterday.

## 2015-12-24 ENCOUNTER — Ambulatory Visit (INDEPENDENT_AMBULATORY_CARE_PROVIDER_SITE_OTHER): Payer: Medicare Other | Admitting: Unknown Physician Specialty

## 2015-12-24 ENCOUNTER — Encounter: Payer: Self-pay | Admitting: Unknown Physician Specialty

## 2015-12-24 VITALS — BP 105/65 | HR 59 | Temp 98.1°F | Ht 62.4 in | Wt 198.0 lb

## 2015-12-24 DIAGNOSIS — F329 Major depressive disorder, single episode, unspecified: Secondary | ICD-10-CM | POA: Diagnosis not present

## 2015-12-24 DIAGNOSIS — G8929 Other chronic pain: Secondary | ICD-10-CM | POA: Diagnosis not present

## 2015-12-24 DIAGNOSIS — F419 Anxiety disorder, unspecified: Secondary | ICD-10-CM

## 2015-12-24 DIAGNOSIS — F322 Major depressive disorder, single episode, severe without psychotic features: Secondary | ICD-10-CM

## 2015-12-24 DIAGNOSIS — G252 Other specified forms of tremor: Secondary | ICD-10-CM

## 2015-12-24 NOTE — Progress Notes (Signed)
BP 105/65 mmHg  Pulse 59  Temp(Src) 98.1 F (36.7 C)  Ht 5' 2.4" (1.585 m)  Wt 198 lb (89.812 kg)  BMI 35.75 kg/m2  SpO2 98%   Subjective:    Patient ID: Susan Houston, female    DOB: [birth year], 55 y.o.   MRN: [id number]  HPI: Susan Houston is a 55 y.o. female  Chief Complaint  Patient presents with  . paperwork   Pt is here for a driving evaluation due to pain, psychiatric issues, and respiratory issues.  She has never been in an accident.  She does have some difficulty with questions about her health.  She seems to be well controlled from a psychiatric point of view but on a lot of medications.  Under the care of Dr. Kasandra Knudsen for psychiatry and Dr. Josefa Half for pain management.    Relevant past medical, surgical, family and social history reviewed and updated as indicated. Interim medical history since our last visit reviewed. Allergies and medications reviewed and updated.  Review of Systems  Per HPI unless specifically indicated above     Objective:    BP 105/65 mmHg  Pulse 59  Temp(Src) 98.1 F (36.7 C)  Ht 5' 2.4" (1.585 m)  Wt 198 lb (89.812 kg)  BMI 35.75 kg/m2  SpO2 98%  Wt Readings from Last 3 Encounters:  12/24/15 198 lb (89.812 kg)  12/11/15 185 lb (83.915 kg)  12/03/15 186 lb (84.369 kg)    Physical Exam  Constitutional: She is oriented to person, place, and time. She appears well-developed and well-nourished. No distress.  HENT:  Head: Normocephalic and atraumatic.  Eyes: Conjunctivae and lids are normal. Right eye exhibits no discharge. Left eye exhibits no discharge. No scleral icterus.  Cardiovascular: Normal rate and regular rhythm.   Pulmonary/Chest: Effort normal. No accessory muscle usage. No respiratory distress.  Abdominal: Normal appearance. There is no splenomegaly or hepatomegaly.  Musculoskeletal: Normal range of motion.  Neurological: She is alert and oriented to person, place, and time. She is not disoriented. She displays tremor. No cranial  nerve deficit or sensory deficit. Gait abnormal.  Walks with a cane  Skin: Skin is intact. No rash noted. No pallor.  Psychiatric: She has a normal mood and affect. Her behavior is normal. Judgment and thought content normal.    Results for orders placed or performed in visit on 12/23/15  HM MAMMOGRAPHY  Result Value Ref Range   HM Mammogram from [facility]   HM COLONOSCOPY  Result Value Ref Range   HM Colonoscopy from [facility]       Assessment & Plan:   Problem List Items Addressed This Visit      Unprioritized   Chronic pain - Primary   Relevant Medications   traZODone (DESYREL) 100 MG tablet   Other Relevant Orders   Ambulatory referral to Occupational Therapy   Severe depression   Relevant Medications   traZODone (DESYREL) 100 MG tablet   Chronic anxiety   Relevant Medications   traZODone (DESYREL) 100 MG tablet   Coarse tremors   Relevant Orders   Ambulatory referral to Occupational Therapy   Ambulatory referral to Neurology      Paperwork filled out to only drive in a 50 mile radius during daylight hours.  Evaluation to happen yearly.  Get occupational evaluation.  I am concerned with her current tremors for about 7 months.  Refer to neurology for further evaluations.  Reconsider driving privleges at a 6 onth follow up following evaluations  Follow up plan: Return in about 6 months (around 06/25/2016).

## 2015-12-26 DIAGNOSIS — Z79899 Other long term (current) drug therapy: Secondary | ICD-10-CM | POA: Diagnosis not present

## 2015-12-26 DIAGNOSIS — F17200 Nicotine dependence, unspecified, uncomplicated: Secondary | ICD-10-CM | POA: Diagnosis not present

## 2015-12-26 DIAGNOSIS — F419 Anxiety disorder, unspecified: Secondary | ICD-10-CM | POA: Diagnosis not present

## 2015-12-26 DIAGNOSIS — F331 Major depressive disorder, recurrent, moderate: Secondary | ICD-10-CM | POA: Diagnosis not present

## 2016-01-02 ENCOUNTER — Encounter: Payer: Self-pay | Admitting: Pain Medicine

## 2016-01-02 ENCOUNTER — Ambulatory Visit: Payer: Medicare Other | Attending: Pain Medicine | Admitting: Pain Medicine

## 2016-01-02 VITALS — BP 95/71 | HR 53 | Temp 97.8°F | Resp 16 | Ht 63.0 in | Wt 185.0 lb

## 2016-01-02 DIAGNOSIS — M791 Myalgia: Secondary | ICD-10-CM | POA: Diagnosis not present

## 2016-01-02 DIAGNOSIS — M533 Sacrococcygeal disorders, not elsewhere classified: Secondary | ICD-10-CM | POA: Insufficient documentation

## 2016-01-02 DIAGNOSIS — M5416 Radiculopathy, lumbar region: Secondary | ICD-10-CM | POA: Diagnosis not present

## 2016-01-02 DIAGNOSIS — M461 Sacroiliitis, not elsewhere classified: Secondary | ICD-10-CM | POA: Diagnosis not present

## 2016-01-02 DIAGNOSIS — M47816 Spondylosis without myelopathy or radiculopathy, lumbar region: Secondary | ICD-10-CM

## 2016-01-02 DIAGNOSIS — M47817 Spondylosis without myelopathy or radiculopathy, lumbosacral region: Secondary | ICD-10-CM | POA: Diagnosis not present

## 2016-01-02 DIAGNOSIS — M5137 Other intervertebral disc degeneration, lumbosacral region: Secondary | ICD-10-CM

## 2016-01-02 DIAGNOSIS — M5136 Other intervertebral disc degeneration, lumbar region: Secondary | ICD-10-CM

## 2016-01-02 DIAGNOSIS — M5126 Other intervertebral disc displacement, lumbar region: Secondary | ICD-10-CM | POA: Diagnosis not present

## 2016-01-02 DIAGNOSIS — M545 Low back pain: Secondary | ICD-10-CM | POA: Diagnosis present

## 2016-01-02 DIAGNOSIS — M5116 Intervertebral disc disorders with radiculopathy, lumbar region: Secondary | ICD-10-CM | POA: Diagnosis not present

## 2016-01-02 DIAGNOSIS — M47896 Other spondylosis, lumbar region: Secondary | ICD-10-CM | POA: Insufficient documentation

## 2016-01-02 DIAGNOSIS — M79606 Pain in leg, unspecified: Secondary | ICD-10-CM | POA: Diagnosis present

## 2016-01-02 DIAGNOSIS — Z96653 Presence of artificial knee joint, bilateral: Secondary | ICD-10-CM

## 2016-01-02 MED ORDER — TAPENTADOL HCL 50 MG PO TABS: ORAL_TABLET | ORAL | Status: DC

## 2016-01-02 NOTE — Progress Notes (Signed)
   Subjective:    Patient ID: Susan Houston, female    DOB: 1961/05/23, 55 y.o.   MRN: [id number]  HPI   The patient is a 55 year old female who returns to pain management for further evaluation and treatment of pain involving the lower back and lower extremity region. The patient states that she has had some improvement of her pain involving the lower back and lower extremity regions. At the present time patient continues Nucynta. The patient states the pain is aggravated by standing walking twisting turning maneuvers and that the pain becomes more intense as the day progresses. We discussed patient's condition including interventional treatment. The patient stated that the lumbosacral selective nerve root block provided her with most significant relief of pain. We will proceed with lumbosacral selective nerve root block at time of return appointment in attempt to decrease severity of patient's symptoms, minimize progression of patient's symptoms, and avoid the need for more involved treatment. We've also discussed surgical reevaluation and will consider such as well. The patient was with understanding and in agreement with suggested treatment plan.     Review of Systems     Objective:   Physical Exam  There was tenderness of the splenius capitis and occipitalis musculature region a mild degree with mild tenderness of the cervical facet cervical paraspinal musculature region. Palpation of the acromioclavicular and glenohumeral joint regions reproduces minimal discomfort. The patient appeared to be with bilaterally equal grip strength. Tinel and Phalen's maneuver were without increased pain of significant degree. There was tenderness of the thoracic facet thoracic paraspinal must reason involving the lower thoracic region with moderate muscle spasms noted in the lower thoracic paraspinal musculature region. There was no crepitus of the thoracic region noted. Palpation over the lumbar paraspinal  musculatures and lumbar facet region was attends to palpation of moderate to moderately severe degree with lateral bending rotation extension and palpation of the lumbar facets reproducing moderately severe discomfort. Palpation over the PSIS and PII S region reproduces moderate discomfort as well as mild tenderness of the greater trochanteric region and iliotibial band region. He was tolerated to possibly 20 without a definite increased pain with dorsiflexion noted. EHL strength was decreased. There was negative clonus negative Homans. There was increase of pain with pressure applied to the ileum with patient in lateral units position. Abdomen was nontender with no costovertebral tenderness noted.    Assessment & Plan:     Degenerative disc disease lumbar spine Degenerative changes L4-5 disc desiccation, circumferential bulging and focal protrusion in the left neural foramen to extraforaminal region with facet and ligamentous hypertrophy, L5-S1 facet degeneration with no disc abnormalities.  Lumbar radiculopathy  Lumbar facet syndrome  Sacroiliac joint dysfunction     PLAN  Continue present medication Nucynta  Lumbosacral selective nerve root block to be performed at time of return appointment  F/U PCP Dr. Jeananne Rama for evaliation of  BP and general medical  condition.   F/U surgical evaluation. May consider pending follow-up evaluation  F/U Dr.Su as planned   F/U neurological evaluation. May consider PNCV/EMG studies and other studies pending follow-up evaluations  Radiofrequency rhizolysis lumbar facet medial branch nerves to be performed pending insurance approval  Ask the nurses and secretaries if insurance has approved you for radiofrequency  of the lumbar facet, medial branch nerve block  Patient to call [facility] should patient have concerns prior to scheduled return appointment.

## 2016-01-02 NOTE — Patient Instructions (Addendum)
PLAN  Continue present medication Nucynta  Lumbosacral selective nerve root block to be performed at time of return appointment  F/U PCP Dr. Jeananne Rama for evaliation of  BP and general medical  condition.   F/U surgical evaluation. May consider pending follow-up evaluation  F/U Dr.Su as planned   F/U neurological evaluation. May consider PNCV/EMG studies and other studies pending follow-up evaluations  Radiofrequency rhizolysis lumbar facet medial branch nerves to be performed pending insurance approval  Ask the nurses and secretaries if insurance has approved you for radiofrequency  of the lumbar facet, medial branch nerve block  Patient to call [facility] Center should patient have concerns prior to scheduled return appointment.Selective Nerve Root Block Patient Information  Description: Specific nerve roots exit the spinal canal and these nerves can be compressed and inflamed by a bulging disc and bone spurs.  By injecting steroids on the nerve root, we can potentially decrease the inflammation surrounding these nerves, which often leads to decreased pain.  Also, by injecting local anesthesia on the nerve root, this can provide [place] helpful information to give to your referring doctor if it decreases your pain.  Selective nerve root blocks can be done along the spine from the neck to the low back depending on the location of your pain.   After numbing the skin with local anesthesia, a small needle is passed to the nerve root and the position of the needle is verified using x-ray pictures.  After the needle is in correct position, we then deposit the medication.  You may experience a pressure sensation while this is being done.  The entire block usually lasts less than 15 minutes.  Conditions that may be treated with selective nerve root blocks:  Low back and leg pain  Spinal stenosis  Diagnostic block prior to potential surgery  Neck and arm pain  Post laminectomy  syndrome  Preparation for the injection:  1. Do not eat any solid food or dairy products within 8 hours of your appointment. 2. You may drink clear liquids up to 3 hours before an appointment.  Clear liquids include water, black coffee, juice or soda.  No milk or cream please. 3. You may take your regular medications, including pain medications, with a sip of water before your appointment.  Diabetics should hold regular insulin (if taken separately) and take 1/2 normal NPH dose the morning of the procedure.  Carry some sugar containing items with you to your appointment. 4. A driver must accompany you and be prepared to drive you home after your procedure. 5. Bring all your current medications with you. 6. An IV may be inserted and sedation may be given at the discretion of the physician. 7. A blood pressure cuff, EKG, and other monitors will often be applied during the procedure.  Some patients may need to have extra oxygen administered for a short period. 8. You will be asked to provide medical information, including allergies, prior to the procedure.  We must know immediately if you are taking blood  Thinners (like Coumadin) or if you are allergic to IV iodine contrast (dye).  Possible side-effects: All are usually temporary  Bleeding from needle site  Light headedness  Numbness and tingling  Decreased blood pressure  Weakness in arms/legs  Pressure sensation in back/neck  Pain at injection site (several days)  Possible complications: All are extremely rare  Infection  Nerve injury  Spinal headache (a headache wore with upright position)  Call if you experience:  Fever/chills associated  with headache or increased back/neck pain  Headache worsened by an upright position  New onset weakness or numbness of an extremity below the injection site  Hives or difficulty breathing (go to the emergency room)  Inflammation or drainage at the injection site(s)  Severe  back/neck pain greater than usual  New symptoms which are concerning to you  Please note:  Although the local anesthetic injected can often make your back or neck feel good for several hours after the injection the pain will likely return.  It takes 3-5 days for steroids to work on the nerve root. You may not notice any pain relief for at least one week.  If effective, we will often do a series of 3 injections spaced 3-6 weeks apart to maximally decrease your pain.    If you have any questions, please call [phone] [facility] R[facility] AND COM[facility]S  What are the risk, side effects and possible complications? Generally speaking, most procedures are safe.  However, with any procedure there are risks, side effects, and the possibility of complications.  The risks and complications are dependent upon the sites that are lesioned, or the type of nerve block to be performed.  The closer the procedure is to the spine, the more serious the risks are.  Great care is taken when placing the radio frequency needles, block needles or lesioning probes, but sometimes complications can occur. 1. Infection: Any time there is an injection through the skin, there is a risk of infection.  This is why sterile conditions are used for these blocks.  There are four possible types of infection. 1. Localized skin infection. 2. Central Nervous System Infection-This can be in the form of Meningitis, which can be deadly. 3. Epidural Infections-This can be in the form of an epidural abscess, which can cause pressure inside of the spine, causing compression of the spinal cord with subsequent paralysis. This would require an emergency surgery to decompress, and there are no guarantees that the patient would recover from the paralysis. 4. Discitis-This is an infection of the intervertebral discs.  It occurs in about 1% of discography procedures.  It is difficult to treat and it may  lead to surgery.        2. Pain: the needles have to go through skin and soft tissues, will cause soreness.       3. Damage to internal structures:  The nerves to be lesioned may be near blood vessels or    other nerves which can be potentially damaged.       4. Bleeding: Bleeding is more common if the patient is taking blood thinners such as  aspirin, Coumadin, Ticiid, Plavix, etc., or if he/she have some genetic predisposition  such as hemophilia. Bleeding into the spinal canal can cause compression of the spinal  cord with subsequent paralysis.  This would require an emergency surgery to  decompress and there are no guarantees that the patient would recover from the  paralysis.       5. Pneumothorax:  Puncturing of a lung is a possibility, every time a needle is introduced in  the area of the chest or upper back.  Pneumothorax refers to free air around the  collapsed lung(s), inside of the thoracic cavity (chest cavity).  Another two possible  complications related to a similar event would include: Hemothorax and Chylothorax.   These are variations of the Pneumothorax, where instead of air around the collapsed  lung(s), you may have  blood or chyle, respectively.       6. Spinal headaches: They may occur with any procedures in the area of the spine.       7. Persistent CSF (Cerebro-Spinal Fluid) leakage: This is a rare problem, but may occur  with prolonged intrathecal or epidural catheters either due to the formation of a fistulous  track or a dural tear.       8. Nerve damage: By working so close to the spinal cord, there is always a possibility of  nerve damage, which could be as serious as a permanent spinal cord injury with  paralysis.       9. Death:  Although rare, severe deadly allergic reactions known as "Anaphylactic  reaction" can occur to any of the medications used.      10. Worsening of the symptoms:  We can always make thing worse.  What are the chances of something like this  happening? Chances of any of this occuring are extremely low.  By statistics, you have more of a chance of getting killed in a motor vehicle accident: while driving to the hospital than any of the above occurring .  Nevertheless, you should be aware that they are possibilities.  In general, it is similar to taking a shower.  Everybody knows that you can slip, hit your head and get killed.  Does that mean that you should not shower again?  Nevertheless always keep in mind that statistics do not mean anything if you happen to be on the wrong side of them.  Even if a procedure has a 1 (one) in a 1,000,000 (million) chance of going wrong, it you happen to be that one..Also, keep in mind that by statistics, you have more of a chance of having something go wrong when taking medications.  Who should not have this procedure? If you are on a blood thinning medication (e.g. Coumadin, Plavix, see list of "Blood Thinners"), or if you have an active infection going on, you should not have the procedure.  If you are taking any blood thinners, please inform your physician.  How should I prepare for this procedure?  Do not eat or drink anything at least six hours prior to the procedure.  Bring a driver with you .  It cannot be a taxi.  Come accompanied by an adult that can drive you back, and that is strong enough to help you if your legs get weak or numb from the local anesthetic.  Take all of your medicines the morning of the procedure with just enough water to swallow them.  If you have diabetes, make sure that you are scheduled to have your procedure done first thing in the morning, whenever possible.  If you have diabetes, take only half of your insulin dose and notify our nurse that you have done so as soon as you arrive at the clinic.  If you are diabetic, but only take blood sugar pills (oral hypoglycemic), then do not take them on the morning of your procedure.  You may take them after you have had the  procedure.  Do not take aspirin or any aspirin-containing medications, at least eleven (11) days prior to the procedure.  They may prolong bleeding.  Wear loose fitting clothing that may be easy to take off and that you would not mind if it got stained with Betadine or blood.  Do not wear any jewelry or perfume  Remove any nail coloring.  It will interfere with some  of our monitoring equipment.  NOTE: Remember that this is not meant to be interpreted as a complete list of all possible complications.  Unforeseen problems may occur.  BLOOD THINNERS The following drugs contain aspirin or other products, which can cause increased bleeding during surgery and should not be taken for 2 weeks prior to and 1 week after surgery.  If you should need take something for relief of minor pain, you may take acetaminophen which is found in Tylenol,m Datril, Anacin-3 and Panadol. It is not blood thinner. The products listed below are.  Do not take any of the products listed below in addition to any listed on your instruction sheet.  A.P.C or A.P.C with Codeine Codeine Phosphate Capsules #3 Ibuprofen Ridaura  ABC compound Congesprin Imuran rimadil  Advil Cope Indocin Robaxisal  Alka-Seltzer Effervescent Pain Reliever and Antacid Coricidin or Coricidin-D  Indomethacin Rufen  Alka-Seltzer plus Cold Medicine Cosprin Ketoprofen S-A-C Tablets  Anacin Analgesic Tablets or Capsules Coumadin Korlgesic Salflex  Anacin Extra Strength Analgesic tablets or capsules CP-2 Tablets Lanoril Salicylate  Anaprox Cuprimine Capsules Levenox Salocol  Anexsia-D Dalteparin Magan Salsalate  Anodynos Darvon compound Magnesium Salicylate Sine-off  Ansaid Dasin Capsules Magsal Sodium Salicylate  Anturane Depen Capsules Marnal Soma  APF Arthritis pain formula Dewitt's Pills Measurin Stanback  Argesic Dia-Gesic Meclofenamic Sulfinpyrazone  Arthritis Bayer Timed Release Aspirin Diclofenac Meclomen Sulindac  Arthritis pain formula  Anacin Dicumarol Medipren Supac  Analgesic (Safety coated) Arthralgen Diffunasal Mefanamic Suprofen  Arthritis Strength Bufferin Dihydrocodeine Mepro Compound Suprol  Arthropan liquid Dopirydamole Methcarbomol with Aspirin Synalgos  ASA tablets/Enseals Disalcid Micrainin Tagament  Ascriptin Doan's Midol Talwin  Ascriptin A/D Dolene Mobidin Tanderil  Ascriptin Extra Strength Dolobid Moblgesic Ticlid  Ascriptin with Codeine Doloprin or Doloprin with Codeine Momentum Tolectin  Asperbuf Duoprin Mono-gesic Trendar  Aspergum Duradyne Motrin or Motrin IB Triminicin  Aspirin plain, buffered or enteric coated Durasal Myochrisine Trigesic  Aspirin Suppositories Easprin Nalfon Trillsate  Aspirin with Codeine Ecotrin Regular or Extra Strength Naprosyn Uracel  Atromid-S Efficin Naproxen Ursinus  Auranofin Capsules Elmiron Neocylate Vanquish  Axotal Emagrin Norgesic Verin  Azathioprine Empirin or Empirin with Codeine Normiflo Vitamin E  Azolid Emprazil Nuprin Voltaren  Bayer Aspirin plain, buffered or children's or timed BC Tablets or powders Encaprin Orgaran Warfarin Sodium  Buff-a-Comp Enoxaparin Orudis Zorpin  Buff-a-Comp with Codeine Equegesic Os-Cal-Gesic   Buffaprin Excedrin plain, buffered or Extra Strength Oxalid   Bufferin Arthritis Strength Feldene Oxphenbutazone   Bufferin plain or Extra Strength Feldene Capsules Oxycodone with Aspirin   Bufferin with Codeine Fenoprofen Fenoprofen Pabalate or Pabalate-SF   Buffets II Flogesic Panagesic   Buffinol plain or Extra Strength Florinal or Florinal with Codeine Panwarfarin   Buf-Tabs Flurbiprofen Penicillamine   Butalbital Compound Four-way cold tablets Penicillin   Butazolidin Fragmin Pepto-Bismol   Carbenicillin Geminisyn Percodan   Carna Arthritis Reliever Geopen Persantine   Carprofen Gold's salt Persistin   Chloramphenicol Goody's Phenylbutazone   Chloromycetin Haltrain Piroxlcam   Clmetidine heparin Plaquenil   Cllnoril Hyco-pap  Ponstel   Clofibrate Hydroxy chloroquine Propoxyphen         Before stopping any of these medications, be sure to consult the physician who ordered them.  Some, such as Coumadin (Warfarin) are ordered to prevent or treat serious conditions such as "deep thrombosis", "pumonary embolisms", and other heart problems.  The amount of time that you may need off of the medication may also vary with the medication and the reason for which you were taking it.  If  you are taking any of these medications, please make sure you notify your pain physician before you undergo any procedures.

## 2016-01-13 ENCOUNTER — Ambulatory Visit: Payer: Medicare Other | Attending: Pain Medicine | Admitting: Pain Medicine

## 2016-01-13 ENCOUNTER — Encounter: Payer: Self-pay | Admitting: Pain Medicine

## 2016-01-13 VITALS — BP 111/68 | HR 63 | Temp 97.4°F | Resp 12 | Ht 63.0 in | Wt 185.0 lb

## 2016-01-13 DIAGNOSIS — M79606 Pain in leg, unspecified: Secondary | ICD-10-CM | POA: Diagnosis present

## 2016-01-13 DIAGNOSIS — Z96653 Presence of artificial knee joint, bilateral: Secondary | ICD-10-CM

## 2016-01-13 DIAGNOSIS — M533 Sacrococcygeal disorders, not elsewhere classified: Secondary | ICD-10-CM

## 2016-01-13 DIAGNOSIS — M47816 Spondylosis without myelopathy or radiculopathy, lumbar region: Secondary | ICD-10-CM | POA: Insufficient documentation

## 2016-01-13 DIAGNOSIS — M5416 Radiculopathy, lumbar region: Secondary | ICD-10-CM | POA: Diagnosis not present

## 2016-01-13 DIAGNOSIS — M545 Low back pain: Secondary | ICD-10-CM | POA: Diagnosis present

## 2016-01-13 DIAGNOSIS — M5137 Other intervertebral disc degeneration, lumbosacral region: Secondary | ICD-10-CM

## 2016-01-13 DIAGNOSIS — M47817 Spondylosis without myelopathy or radiculopathy, lumbosacral region: Secondary | ICD-10-CM | POA: Diagnosis not present

## 2016-01-13 DIAGNOSIS — M5126 Other intervertebral disc displacement, lumbar region: Secondary | ICD-10-CM | POA: Insufficient documentation

## 2016-01-13 DIAGNOSIS — M5136 Other intervertebral disc degeneration, lumbar region: Secondary | ICD-10-CM | POA: Diagnosis not present

## 2016-01-13 MED ORDER — ORPHENADRINE CITRATE 30 MG/ML IJ SOLN
INTRAMUSCULAR | Status: AC
  Administered 2016-01-13: 09:00:00
  Filled 2016-01-13: qty 2

## 2016-01-13 MED ORDER — BUPIVACAINE HCL (PF) 0.25 % IJ SOLN
INTRAMUSCULAR | Status: AC
  Administered 2016-01-13: 09:00:00
  Filled 2016-01-13: qty 30

## 2016-01-13 MED ORDER — MIDAZOLAM HCL 5 MG/5ML IJ SOLN
INTRAMUSCULAR | Status: AC
  Administered 2016-01-13: 5 mg via INTRAVENOUS
  Filled 2016-01-13: qty 5

## 2016-01-13 MED ORDER — TRIAMCINOLONE ACETONIDE 40 MG/ML IJ SUSP
INTRAMUSCULAR | Status: AC
  Administered 2016-01-13: 09:00:00
  Filled 2016-01-13: qty 1

## 2016-01-13 MED ORDER — FENTANYL CITRATE (PF) 100 MCG/2ML IJ SOLN
INTRAMUSCULAR | Status: AC
  Administered 2016-01-13: 100 ug via INTRAVENOUS
  Filled 2016-01-13: qty 2

## 2016-01-13 MED ORDER — CEFAZOLIN SODIUM 1 G IJ SOLR
INTRAMUSCULAR | Status: AC
Start: 2016-01-13 — End: 2016-01-13
  Administered 2016-01-13: 09:00:00
  Filled 2016-01-13: qty 10

## 2016-01-13 NOTE — Patient Instructions (Signed)

## 2016-01-13 NOTE — Progress Notes (Signed)
Subjective:    Patient ID: Susan Houston, female    DOB: 30-Sep-1961, 55 y.o.   MRN: [id number]  HPI  PROCEDURE PERFORMED: Lumbosacral selective nerve root block   NOTE: The patient is a 55 y.o. female who returns to [place] for further evaluation and treatment of pain involving the lumbar and lower extremity region. Studies consisting of MRI revealed patient to be with degenerative disc disease lumbar spine Degenerative changes L4-5 disc desiccation, circumferential bulging and focal protrusion in the left neural foramen to extraforaminal region with facet and ligamentous hypertrophy, L5-S1 facet degeneration with no disc abnormalities. There is concern regarding intraspinal abnormalities contributing to the patient's symptomatology with concern regarding a component of lumbar radiculopathy. The risks, benefits, and expectations of the procedure have been explained to the patient who was understanding and in agreement with suggested treatment plan. We will proceed with interventional treatment as discussed and as explained to the patient. The patient is understanding and in agreement with suggested treatment plan.   DESCRIPTION OF PROCEDURE: Lumbosacral selective nerve root block with IV Versed, IV fentanyl conscious sedation, EKG, blood pressure, pulse, and pulse oximetry monitoring. The procedure was performed with the patient in the prone position under fluoroscopic guidance. With the patient in the prone position, Betadine prep of proposed entry site was performed. Local anesthetic skin wheal of proposed needle entry site was prepared with 1.5% plain lidocaine with AP view of the lumbosacral spine.   PROCEDURE #1: Needle placement at the right L 2 vertebral body: A 22 -gauge needle was inserted at the inferior border of the transverse process of the vertebral body with needle placed medial to the midline of the transverse process on AP view of the lumbosacral spine.   NEEDLE  PLACEMENT AT  L3, L4, and L5  VERTEBRAL BODY LEVELS  Needle  placement was accomplished at L3, L4, and L5  vertebral body levels on the right side exactly as was accomplished at the L2  vertebral body level  and utilizing the same technique and under fluoroscopic guidance.  PROCEDURE #4: Needle placement at the S1 foramen. With the patient in the prone position with Betadine prep of proposed entry site accomplished, the S1 foramen was visualized under fluoroscopic guidance with AP view of the lumbosacral spine with cephalad orientation of the fluoroscope with local anesthetic skin wheal of 1.5% lidocaine of proposed needle entry site prepared. A 22-gauge needle was inserted S1 foramen under fluoroscopic guidance eliciting paresthesias radiating from the buttocks to the lower extremity after which needle was slightly withdrawn.   Needle placement was then verified on lateral view at all levels with needle tip documented to be in the posterior superior quadrant of the intervertebral foramen of  L 2, L3, L4, and L5. Following negative aspiration for heme and CSF at each level, each level was injected with 3 mL of 0.25% bupivacaine with Kenalog.  Myoneural block injections of the lumbar paraspinal musculature region Following Betadine prep of proposed entry site a 22-gauge needle was inserted in the lumbar paraspinal musculature region and following negative aspiration 2 cc of 0.25% bupivacaine with Norflex was injected for myoneural block injection of the lumbar region 4    The patient tolerated the procedure well.   A total of 10 mg of Kenalog was utilized for the procedure.   PLAN:  1. Medications: Will continue presently prescribed medication Nucynta 2. The patient is to undergo follow-up evaluation with PCP Dr. Jeananne Rama for evaluation of blood pressure and  general medical condition status post procedure performed on today's visit. 3. Surgical follow-up evaluation. Has been  addressed 4. Neurological evaluation. May consider PNCV EMG studies and other studies 5. F/U Dr. Kasandra Knudsen as planned 6. May consider radiofrequency procedures, implantation type procedures and other treatment pending response to treatment and follow-up evaluation. 7. The patient has been advise do adhere to proper body mechanics and avoid activities which may aggravate condition. 8. The patient has been advised to call the [facility] Management Center prior to scheduled return appointment should there be significant change in the patient's condition or should the patient have other concerns regarding condition prior to scheduled return appointment.   Review of Systems     Objective:   Physical Exam        Assessment & Plan:

## 2016-01-13 NOTE — Progress Notes (Signed)
Safety precautions to be maintained throughout the outpatient stay will include: orient to surroundings, keep bed in low position, maintain call bell within reach at all times, provide assistance with transfer out of bed and ambulation.  

## 2016-01-14 ENCOUNTER — Ambulatory Visit: Payer: Medicare Other | Admitting: Neurology

## 2016-01-14 ENCOUNTER — Telehealth: Payer: Self-pay | Admitting: *Deleted

## 2016-01-14 NOTE — Telephone Encounter (Signed)
Message left

## 2016-01-21 ENCOUNTER — Ambulatory Visit: Payer: Medicare Other | Admitting: Neurology

## 2016-01-21 ENCOUNTER — Telehealth: Payer: Self-pay

## 2016-01-21 NOTE — Telephone Encounter (Signed)
Patient did not show to new referral appt.

## 2016-01-24 ENCOUNTER — Encounter: Payer: Self-pay | Admitting: Neurology

## 2016-01-30 ENCOUNTER — Ambulatory Visit: Payer: Medicare Other | Attending: Pain Medicine | Admitting: Pain Medicine

## 2016-01-30 ENCOUNTER — Encounter: Payer: Self-pay | Admitting: Pain Medicine

## 2016-01-30 VITALS — BP 133/72 | HR 53 | Temp 97.5°F | Resp 18 | Ht 63.0 in | Wt 185.0 lb

## 2016-01-30 DIAGNOSIS — M5416 Radiculopathy, lumbar region: Secondary | ICD-10-CM | POA: Diagnosis not present

## 2016-01-30 DIAGNOSIS — M5116 Intervertebral disc disorders with radiculopathy, lumbar region: Secondary | ICD-10-CM | POA: Insufficient documentation

## 2016-01-30 DIAGNOSIS — M47816 Spondylosis without myelopathy or radiculopathy, lumbar region: Secondary | ICD-10-CM

## 2016-01-30 DIAGNOSIS — M5136 Other intervertebral disc degeneration, lumbar region: Secondary | ICD-10-CM

## 2016-01-30 DIAGNOSIS — M461 Sacroiliitis, not elsewhere classified: Secondary | ICD-10-CM | POA: Diagnosis not present

## 2016-01-30 DIAGNOSIS — M47896 Other spondylosis, lumbar region: Secondary | ICD-10-CM | POA: Diagnosis not present

## 2016-01-30 DIAGNOSIS — M51369 Other intervertebral disc degeneration, lumbar region without mention of lumbar back pain or lower extremity pain: Secondary | ICD-10-CM

## 2016-01-30 DIAGNOSIS — M533 Sacrococcygeal disorders, not elsewhere classified: Secondary | ICD-10-CM

## 2016-01-30 DIAGNOSIS — Z96653 Presence of artificial knee joint, bilateral: Secondary | ICD-10-CM

## 2016-01-30 DIAGNOSIS — M5137 Other intervertebral disc degeneration, lumbosacral region: Secondary | ICD-10-CM | POA: Diagnosis not present

## 2016-01-30 DIAGNOSIS — M79606 Pain in leg, unspecified: Secondary | ICD-10-CM | POA: Diagnosis present

## 2016-01-30 DIAGNOSIS — M5126 Other intervertebral disc displacement, lumbar region: Secondary | ICD-10-CM | POA: Diagnosis not present

## 2016-01-30 DIAGNOSIS — M791 Myalgia: Secondary | ICD-10-CM | POA: Diagnosis not present

## 2016-01-30 DIAGNOSIS — M51379 Other intervertebral disc degeneration, lumbosacral region without mention of lumbar back pain or lower extremity pain: Secondary | ICD-10-CM

## 2016-01-30 DIAGNOSIS — M47817 Spondylosis without myelopathy or radiculopathy, lumbosacral region: Secondary | ICD-10-CM | POA: Diagnosis not present

## 2016-01-30 DIAGNOSIS — M545 Low back pain: Secondary | ICD-10-CM | POA: Diagnosis not present

## 2016-01-30 MED ORDER — TAPENTADOL HCL 50 MG PO TABS: ORAL_TABLET | ORAL | Status: DC

## 2016-01-30 NOTE — Progress Notes (Signed)
   Subjective:    Patient ID: Susan Houston, female    DOB: 1961-01-14, 55 y.o.   MRN: [id number]  HPI  The patient is a 55 year old female who returns to pain management for further evaluation and treatment of pain involving the lower back and lower extremity regions predominantly the patient states that she has had improvement with prior treatment performed in pain management Center with some return of pain of significant degree at this time. The patient continues Nucynta and and the patient states that both interventional treatment and Nucynta have reduced pain significantly. We discussed patient's condition and will consider patient for interventional treatment at time return appointment consisting of block of nerves to the sacroiliac joint. The patient is with understanding and agreed with suggested treatment plan.     Review of Systems     Objective:   Physical Exam  There was tenderness of the splenius capitis and occipitalis musculature region of minimal degree with minimal tenderness of the cervical facet cervical paraspinal musculature region. Palpation of the acromioclavicular and glenohumeral joint regions were without increased pain of significant degree. The patient appeared to be with bilaterally equal grip strength and Tinel and Phalen's maneuver were without increased pain significant degree. Palpation over the thoracic facet thoracic paraspinal must reason was tenderness to palpation of moderate degree with no crepitus of the thoracic region noted. Palpation over the lumbar paraspinal must reason lumbar facet region was with moderate to moderately severe discomfort with palpation of the PSIS and PII S region reproducing moderately severe discomfort. There was mild tenderness to palpation of the greater trochanteric region and iliotibial band There was increased pain with pressure applied to the ileum with patient in lateral decubitus position. There was appeared to be positive  Patrick's maneuver. DTRs were difficult to elicit. Abdomen was without excessive tends to palpation and no costovertebral tenderness was noted. No definite sensory deficit or dermatomal distribution was detected.      Assessment & Plan:     Degenerative disc disease lumbar spine Degenerative changes L4-5 disc desiccation, circumferential bulging and focal protrusion in the left neural foramen to extraforaminal region with facet and ligamentous hypertrophy, L5-S1 facet degeneration with no disc abnormalities.  Lumbar radiculopathy  Lumbar facet syndrome  Sacroiliac joint dysfunction     PLAN  Continue present medication Nucynta  Block of nerves to the sacroiliac joint to be performed at time return appointment  F/U PCP Dr. Jeananne Rama for evaliation of  BP and general medical  condition.   F/U surgical evaluation. May consider pending follow-up evaluation  F/U Dr.Su as planned   F/U neurological evaluation. May consider PNCV/EMG studies and other studies pending follow-up evaluations  Radiofrequency rhizolysis lumbar facet medial branch nerves to be performed pending insurance approval  Ask the nurses and secretaries if insurance has approved you for radiofrequency  of the lumbar facet, medial branch nerve block  Patient to call Pain Management Center should patient have concerns prior to scheduled return appointment

## 2016-01-30 NOTE — Progress Notes (Signed)
Safety precautions to be maintained throughout the outpatient stay will include: orient to surroundings, keep bed in low position, maintain call bell within reach at all times, provide assistance with transfer out of bed and ambulation.  

## 2016-01-30 NOTE — Patient Instructions (Addendum)
PLAN  Continue present medication Nucynta  Block of nerves to the sacroiliac joint to be performed at time return appointment  F/U PCP Dr. Jeananne Rama for evaliation of  BP and general medical  condition.   F/U surgical evaluation. May consider pending follow-up evaluation  F/U Dr.Su as planned   F/U neurological evaluation. May consider PNCV/EMG studies and other studies pending follow-up evaluations  Radiofrequency rhizolysis lumbar facet medial branch nerves to be performed pending insurance approval  Ask the nurses and secretaries if insurance has approved you for radiofrequency  of the lumbar facet, medial branch nerve block  Patient to call Pain Management Center should patient have concerns prior to scheduled return appointmentSacroiliac (SI) Joint Injection Patient Information  Description: The sacroiliac joint connects the scrum (very low back and tailbone) to the ilium (a pelvic bone which also forms half of the hip joint).  Normally this joint experiences very little motion.  When this joint becomes inflamed or unstable low back and or hip and pelvis pain may result.  Injection of this joint with local anesthetics (numbing medicines) and steroids can provide diagnostic information and reduce pain.  This injection is performed with the aid of x-ray guidance into the tailbone area while you are lying on your stomach.   You may experience an electrical sensation down the leg while this is being done.  You may also experience numbness.  We also may ask if we are reproducing your normal pain during the injection.  Conditions which may be treated SI injection:   Low back, buttock, hip or leg pain  Preparation for the Injection:  1. Do not eat any solid food or dairy products within 8 hours of your appointment.  2. You may drink clear liquids up to 3 hours before appointment.  Clear liquids include water, black coffee, juice or soda.  No milk or cream please. 3. You may take your  regular medications, including pain medications with a sip of water before your appointment.  Diabetics should hold regular insulin (if take separately) and take 1/2 normal NPH dose the morning of the procedure.  Carry some sugar containing items with you to your appointment. 4. A driver must accompany you and be prepared to drive you home after your procedure. 5. Bring all of your current medications with you. 6. An IV may be inserted and sedation may be given at the discretion of the physician. 7. A blood pressure cuff, EKG and other monitors will often be applied during the procedure.  Some patients may need to have extra oxygen administered for a short period.  8. You will be asked to provide medical information, including your allergies, prior to the procedure.  We must know immediately if you are taking blood thinners (like Coumadin/Warfarin) or if you are allergic to IV iodine contrast (dye).  We must know if you could possible be pregnant.  Possible side effects:   Bleeding from needle site  Infection (rare, may require surgery)  Nerve injury (rare)  Numbness & tingling (temporary)  A brief convulsion or seizure  Light-headedness (temporary)  Pain at injection site (several days)  Decreased blood pressure (temporary)  Weakness in the leg (temporary)   Call if you experience:   New onset weakness or numbness of an extremity below the injection site that last more than 8 hours.  Hives or difficulty breathing ( go to the emergency room)  Inflammation or drainage at the injection site  Any new symptoms which are concerning to you  Please note:  Although the local anesthetic injected can often make your back/ hip/ buttock/ leg feel good for several hours after the injections, the pain will likely return.  It takes 3-7 days for steroids to work in the sacroiliac area.  You may not notice any pain relief for at least that one week.  If effective, we will often do a series  of three injections spaced 3-6 weeks apart to maximally decrease your pain.  After the initial series, we generally will wait some months before a repeat injection of the same type.  If you have any questions, please call [phone] [place]  What are the risk, side effects and possible complications? Generally speaking, most procedures are safe.  However, with any procedure there are risks, side effects, and the possibility of complications.  The risks and complications are dependent upon the sites that are lesioned, or the type of nerve block to be performed.  The closer the procedure is to the spine, the more serious the risks are.  Great care is taken when placing the radio frequency needles, block needles or lesioning probes, but sometimes complications can occur. 1. Infection: Any time there is an injection through the skin, there is a risk of infection.  This is why sterile conditions are used for these blocks.  There are four possible types of infection. 1. Localized skin infection. 2. Central Nervous System Infection-This can be in the form of Meningitis, which can be deadly. 3. Epidural Infections-This can be in the form of an epidural abscess, which can cause pressure inside of the spine, causing compression of the spinal cord with subsequent paralysis. This would require an emergency surgery to decompress, and there are no guarantees that the patient would recover from the paralysis. 4. Discitis-This is an infection of the intervertebral discs.  It occurs in about 1% of discography procedures.  It is difficult to treat and it may lead to surgery.        2. Pain: the needles have to go through skin and soft tissues, will cause soreness.       3. Damage to internal structures:  The nerves to be lesioned may be near blood vessels or    other nerves which can be potentially damaged.       4. Bleeding: Bleeding is more  common if the patient is taking blood thinners such as  aspirin, Coumadin, Ticiid, Plavix, etc., or if he/she have some genetic predisposition  such as hemophilia. Bleeding into the spinal canal can cause compression of the spinal  cord with subsequent paralysis.  This would require an emergency surgery to  decompress and there are no guarantees that the patient would recover from the  paralysis.       5. Pneumothorax:  Puncturing of a lung is a possibility, every time a needle is introduced in  the area of the chest or upper back.  Pneumothorax refers to free air around the  collapsed lung(s), inside of the thoracic cavity (chest cavity).  Another two possible  complications related to a similar event would include: Hemothorax and Chylothorax.   These are variations of the Pneumothorax, where instead of air around the collapsed  lung(s), you may have blood or chyle, respectively.       6. Spinal headaches: They may occur with any procedures in the area of the spine.       7. Persistent CSF (Cerebro-Spinal Fluid) leakage:  This is a rare problem, but may occur  with prolonged intrathecal or epidural catheters either due to the formation of a fistulous  track or a dural tear.       8. Nerve damage: By working so close to the spinal cord, there is always a possibility of  nerve damage, which could be as serious as a permanent spinal cord injury with  paralysis.       9. Death:  Although rare, severe deadly allergic reactions known as "Anaphylactic  reaction" can occur to any of the medications used.      10. Worsening of the symptoms:  We can always make thing worse.  What are the chances of something like this happening? Chances of any of this occuring are extremely low.  By statistics, you have more of a chance of getting killed in a motor vehicle accident: while driving to the hospital than any of the above occurring .  Nevertheless, you should be aware that they are possibilities.  In general, it is  similar to taking a shower.  Everybody knows that you can slip, hit your head and get killed.  Does that mean that you should not shower again?  Nevertheless always keep in mind that statistics do not mean anything if you happen to be on the wrong side of them.  Even if a procedure has a 1 (one) in a 1,000,000 (million) chance of going wrong, it you happen to be that one..Also, keep in mind that by statistics, you have more of a chance of having something go wrong when taking medications.  Who should not have this procedure? If you are on a blood thinning medication (e.g. Coumadin, Plavix, see list of "Blood Thinners"), or if you have an active infection going on, you should not have the procedure.  If you are taking any blood thinners, please inform your physician.  How should I prepare for this procedure?  Do not eat or drink anything at least six hours prior to the procedure.  Bring a driver with you .  It cannot be a taxi.  Come accompanied by an adult that can drive you back, and that is strong enough to help you if your legs get weak or numb from the local anesthetic.  Take all of your medicines the morning of the procedure with just enough water to swallow them.  If you have diabetes, make sure that you are scheduled to have your procedure done first thing in the morning, whenever possible.  If you have diabetes, take only half of your insulin dose and notify our nurse that you have done so as soon as you arrive at the clinic.  If you are diabetic, but only take blood sugar pills (oral hypoglycemic), then do not take them on the morning of your procedure.  You may take them after you have had the procedure.  Do not take aspirin or any aspirin-containing medications, at least eleven (11) days prior to the procedure.  They may prolong bleeding.  Wear loose fitting clothing that may be easy to take off and that you would not mind if it got stained with Betadine or blood.  Do not wear any  jewelry or perfume  Remove any nail coloring.  It will interfere with some of our monitoring equipment.  NOTE: Remember that this is not meant to be interpreted as a complete list of all possible complications.  Unforeseen problems may occur.  BLOOD THINNERS The following drugs contain aspirin or  other products, which can cause increased bleeding during surgery and should not be taken for 2 weeks prior to and 1 week after surgery.  If you should need take something for relief of minor pain, you may take acetaminophen which is found in Tylenol,m Datril, Anacin-3 and Panadol. It is not blood thinner. The products listed below are.  Do not take any of the products listed below in addition to any listed on your instruction sheet.  A.P.C or A.P.C with Codeine Codeine Phosphate Capsules #3 Ibuprofen Ridaura  ABC compound Congesprin Imuran rimadil  Advil Cope Indocin Robaxisal  Alka-Seltzer Effervescent Pain Reliever and Antacid Coricidin or Coricidin-D  Indomethacin Rufen  Alka-Seltzer plus Cold Medicine Cosprin Ketoprofen S-A-C Tablets  Anacin Analgesic Tablets or Capsules Coumadin Korlgesic Salflex  Anacin Extra Strength Analgesic tablets or capsules CP-2 Tablets Lanoril Salicylate  Anaprox Cuprimine Capsules Levenox Salocol  Anexsia-D Dalteparin Magan Salsalate  Anodynos Darvon compound Magnesium Salicylate Sine-off  Ansaid Dasin Capsules Magsal Sodium Salicylate  Anturane Depen Capsules Marnal Soma  APF Arthritis pain formula Dewitt's Pills Measurin Stanback  Argesic Dia-Gesic Meclofenamic Sulfinpyrazone  Arthritis Bayer Timed Release Aspirin Diclofenac Meclomen Sulindac  Arthritis pain formula Anacin Dicumarol Medipren Supac  Analgesic (Safety coated) Arthralgen Diffunasal Mefanamic Suprofen  Arthritis Strength Bufferin Dihydrocodeine Mepro Compound Suprol  Arthropan liquid Dopirydamole Methcarbomol with Aspirin Synalgos  ASA tablets/Enseals Disalcid Micrainin Tagament  Ascriptin Doan's  Midol Talwin  Ascriptin A/D Dolene Mobidin Tanderil  Ascriptin Extra Strength Dolobid Moblgesic Ticlid  Ascriptin with Codeine Doloprin or Doloprin with Codeine Momentum Tolectin  Asperbuf Duoprin Mono-gesic Trendar  Aspergum Duradyne Motrin or Motrin IB Triminicin  Aspirin plain, buffered or enteric coated Durasal Myochrisine Trigesic  Aspirin Suppositories Easprin Nalfon Trillsate  Aspirin with Codeine Ecotrin Regular or Extra Strength Naprosyn Uracel  Atromid-S Efficin Naproxen Ursinus  Auranofin Capsules Elmiron Neocylate Vanquish  Axotal Emagrin Norgesic Verin  Azathioprine Empirin or Empirin with Codeine Normiflo Vitamin E  Azolid Emprazil Nuprin Voltaren  Bayer Aspirin plain, buffered or children's or timed BC Tablets or powders Encaprin Orgaran Warfarin Sodium  Buff-a-Comp Enoxaparin Orudis Zorpin  Buff-a-Comp with Codeine Equegesic Os-Cal-Gesic   Buffaprin Excedrin plain, buffered or Extra Strength Oxalid   Bufferin Arthritis Strength Feldene Oxphenbutazone   Bufferin plain or Extra Strength Feldene Capsules Oxycodone with Aspirin   Bufferin with Codeine Fenoprofen Fenoprofen Pabalate or Pabalate-SF   Buffets II Flogesic Panagesic   Buffinol plain or Extra Strength Florinal or Florinal with Codeine Panwarfarin   Buf-Tabs Flurbiprofen Penicillamine   Butalbital Compound Four-way cold tablets Penicillin   Butazolidin Fragmin Pepto-Bismol   Carbenicillin Geminisyn Percodan   Carna Arthritis Reliever Geopen Persantine   Carprofen Gold's salt Persistin   Chloramphenicol Goody's Phenylbutazone   Chloromycetin Haltrain Piroxlcam   Clmetidine heparin Plaquenil   Cllnoril Hyco-pap Ponstel   Clofibrate Hydroxy chloroquine Propoxyphen         Before stopping any of these medications, be sure to consult the physician who ordered them.  Some, such as Coumadin (Warfarin) are ordered to prevent or treat serious conditions such as "deep thrombosis", "pumonary embolisms", and other heart  problems.  The amount of time that you may need off of the medication may also vary with the medication and the reason for which you were taking it.  If you are taking any of these medications, please make sure you notify your pain physician before you undergo any procedures.         tates thatboth medications and prior interventional treatment have been  significantlyIn terms of reducing patient's symptoms. We discussed patient's condition and will continue Nucynta at this time and will consider patient for block of nerves to the sacroiliac joint to be performed at time of return appointment. The patient is with understanding and agreement suggested treatment plan. The patient denies trauma change in events of daily living to cause change in symptomatology.

## 2016-02-05 LAB — PLEASE NOTE

## 2016-02-05 LAB — TOXASSURE SELECT 13 (MW), URINE: PDF: 0

## 2016-02-05 NOTE — Progress Notes (Signed)
Quick Note:  Reviewed. ______ 

## 2016-02-06 NOTE — Progress Notes (Signed)
Red ink noted under medications in meds and orders

## 2016-02-10 ENCOUNTER — Encounter: Payer: Self-pay | Admitting: Pain Medicine

## 2016-02-10 ENCOUNTER — Ambulatory Visit: Payer: Medicare Other | Attending: Pain Medicine | Admitting: Pain Medicine

## 2016-02-10 VITALS — BP 122/65 | HR 57 | Temp 97.1°F | Resp 24 | Ht 63.0 in | Wt 185.0 lb

## 2016-02-10 DIAGNOSIS — M791 Myalgia: Secondary | ICD-10-CM | POA: Diagnosis not present

## 2016-02-10 DIAGNOSIS — M545 Low back pain: Secondary | ICD-10-CM | POA: Diagnosis not present

## 2016-02-10 DIAGNOSIS — M47816 Spondylosis without myelopathy or radiculopathy, lumbar region: Secondary | ICD-10-CM | POA: Insufficient documentation

## 2016-02-10 DIAGNOSIS — M47817 Spondylosis without myelopathy or radiculopathy, lumbosacral region: Secondary | ICD-10-CM | POA: Diagnosis not present

## 2016-02-10 DIAGNOSIS — M5136 Other intervertebral disc degeneration, lumbar region: Secondary | ICD-10-CM | POA: Diagnosis not present

## 2016-02-10 DIAGNOSIS — M5126 Other intervertebral disc displacement, lumbar region: Secondary | ICD-10-CM | POA: Diagnosis not present

## 2016-02-10 DIAGNOSIS — M5137 Other intervertebral disc degeneration, lumbosacral region: Secondary | ICD-10-CM

## 2016-02-10 DIAGNOSIS — M533 Sacrococcygeal disorders, not elsewhere classified: Secondary | ICD-10-CM

## 2016-02-10 DIAGNOSIS — Z96653 Presence of artificial knee joint, bilateral: Secondary | ICD-10-CM

## 2016-02-10 MED ORDER — CEFAZOLIN SODIUM 1 G IJ SOLR
INTRAMUSCULAR | Status: AC
  Administered 2016-02-10: 09:00:00
  Filled 2016-02-10: qty 10

## 2016-02-10 MED ORDER — BUPIVACAINE HCL (PF) 0.25 % IJ SOLN
INTRAMUSCULAR | Status: AC
  Administered 2016-02-10: 09:00:00
  Filled 2016-02-10: qty 30

## 2016-02-10 MED ORDER — TRIAMCINOLONE ACETONIDE 40 MG/ML IJ SUSP: 40.0000 mg | Freq: Once | INTRAMUSCULAR | Status: DC

## 2016-02-10 MED ORDER — TRIAMCINOLONE ACETONIDE 40 MG/ML IJ SUSP
INTRAMUSCULAR | Status: AC
  Administered 2016-02-10: 09:00:00
  Filled 2016-02-10: qty 1

## 2016-02-10 MED ORDER — FENTANYL CITRATE (PF) 100 MCG/2ML IJ SOLN: 100.0000 ug | Freq: Once | INTRAMUSCULAR | Status: DC

## 2016-02-10 MED ORDER — MIDAZOLAM HCL 5 MG/5ML IJ SOLN
INTRAMUSCULAR | Status: AC
  Administered 2016-02-10: 5 mg via INTRAVENOUS
  Filled 2016-02-10: qty 5

## 2016-02-10 MED ORDER — LACTATED RINGERS IV SOLN: 1000.0000 mL | INTRAVENOUS | Status: DC

## 2016-02-10 MED ORDER — ORPHENADRINE CITRATE 30 MG/ML IJ SOLN: 60.0000 mg | Freq: Once | INTRAMUSCULAR | Status: DC

## 2016-02-10 MED ORDER — FENTANYL CITRATE (PF) 100 MCG/2ML IJ SOLN
INTRAMUSCULAR | Status: AC
  Administered 2016-02-10: 100 ug via INTRAVENOUS
  Filled 2016-02-10: qty 2

## 2016-02-10 MED ORDER — BUPIVACAINE HCL (PF) 0.25 % IJ SOLN: 30.0000 mL | Freq: Once | INTRAMUSCULAR | Status: DC

## 2016-02-10 MED ORDER — CEFAZOLIN SODIUM 1-5 GM-% IV SOLN: 1.0000 g | Freq: Once | INTRAVENOUS | Status: DC

## 2016-02-10 MED ORDER — CEFUROXIME AXETIL 250 MG PO TABS: 250.0000 mg | ORAL_TABLET | Freq: Two times a day (BID) | ORAL | Status: DC

## 2016-02-10 MED ORDER — MIDAZOLAM HCL 5 MG/5ML IJ SOLN: 5.0000 mg | Freq: Once | INTRAMUSCULAR | Status: DC

## 2016-02-10 MED ORDER — ORPHENADRINE CITRATE 30 MG/ML IJ SOLN
INTRAMUSCULAR | Status: AC
  Administered 2016-02-10: 09:00:00
  Filled 2016-02-10: qty 2

## 2016-02-10 NOTE — Patient Instructions (Signed)
PLAN  Continue present medication Nucynta and begin taking antibiotic Ceftin as prescribed. Please obtain your antibiotic today and begin taking antibiotic today  F/U PCP Dr. Jeananne Rama for evaliation of  BP and general medical  condition.   F/U surgical evaluation as discussed  F/U Dr.Su as planned   F/U neurological evaluation. May consider PNCV/EMG studies and other studies pending follow-up evaluations  May consider radiofrequency rhizolysis or intraspinal procedures pending response to present treatment and F/U evaluation.  Patient to call Pain Management Center should patient have concerns prior to scheduled return appointment.

## 2016-02-10 NOTE — Progress Notes (Signed)
Safety precautions to be maintained throughout the outpatient stay will include: orient to surroundings, keep bed in low position, maintain call bell within reach at all times, provide assistance with transfer out of bed and ambulation.  

## 2016-02-10 NOTE — Progress Notes (Signed)
Subjective:    Patient ID: Susan Houston, female    DOB: 1961/01/04, 55 y.o.   MRN: [id number]  HPI  PROCEDURE:  Block of nerves to the sacroiliac joint.   NOTE:  The patient is a 55 y.o. female who returns to the Pain Management Center for further evaluation and treatment of pain involving the lower back and lower extremity region with pain in the region of the buttocks as well. Prior MRI studies reveal degenerative disc disease lumbar spine Degenerative changes L4-5 disc desiccation, circumferential bulging and focal protrusion in the left neural foramen to extraforaminal region with facet and ligamentous hypertrophy, L5-S1 facet degeneration with no disc abnormalities..  the patient is with reproduction of severe pain with palpation of the PSIS and PII S region and has positive Patrick's maneuver  There is concern regarding a significant component of the patient's pain being due to sacroiliac joint dysfunction The risks, benefits, expectations of the procedure have been discussed and explained to the patient who is understanding and willing to proceed with interventional treatment in attempt to decrease severity of patient's symptoms, minimize the risk of medication escalation and  hopefully retard the progression of the patient's symptoms. We will proceed with what is felt to be a medically necessary procedure, block of nerves to the sacroiliac joint.   DESCRIPTION OF PROCEDURE:  Block of nerves to the sacroiliac joint.   The patient was taken to the fluoroscopy suite. With the patient in the prone position with EKG, blood pressure, pulse, capnography, and pulse oximetry monitoring, IV Versed, IV fentanyl conscious sedation, Betadine prep of proposed entry site was performed.   Block of nerves at the L5 vertebral body level.   With the patient in prone position, under fluoroscopic guidance, a 22 -gauge needle was inserted at the L5 vertebral body level on the left side. With 15 degrees oblique  orientation a 22 -gauge needle was inserted in the region known as Burton's eye or eye of the Scotty dog. Following documentation of needle placement in the area of Burton's eye or eye of the Scotty dog under fluoroscopic guidance, needle placement was then accomplished at the sacral ala level on the left left side.   Needle placement at the sacral ala.   With the patient in prone position under fluoroscopic guidance with AP view of the lumbosacral spine, a 22 -gauge needle was inserted in the region known as the sacral ala on the left side. Following documentation of needle placement on the left side under fluoroscopic guidance needle placement was then accomplished at the S1 foramen level.   Needle placement at the S1 foramen level.   With the patient in prone position under fluoroscopic guidance with AP view of the lumbosacral spine and cephalad orientation, a 22 -gauge needle was inserted at the superior and lateral border of the S1 foramen on the left side. Following documentation of needle placement at the S1 foramen level on the left side, needle placement was then accomplished at the S2 foramen level on the left side.   Needle placement at the S2 foramen level.   With the patient in prone position with AP view of the lumbosacral spine with cephalad orientation, a 22 - gauge needle was inserted at the superior and lateral border of the S2 foramen under fluoroscopic guidance on the left side. Following needle placement at the L5 vertebral body level, sacral ala, S1 foramen and S2 foramen on the left side, needle placement was verified on lateral view  under fluoroscopic guidance.  Following needle placement documentation on lateral view, each needle was injected with 1 mL of 0.25% bupivacaine and Kenalog.   BLOCK OF THE NERVES TO SACROILIAC JOINT ON THE RIGHT SIDE The procedure was performed on the right side at the same levels as was performed on the left side and utilizing the same technique  as on the left side and was performed under fluoroscopic guidance as on the left side   A total of '10mg'$  of Kenalog was utilized for the procedure.   PLAN:  1. Medications: The patient will continue presently prescribed medication Nucynta 2. The patient will be considered for modification of treatment regimen pending response to the procedure performed on today's visit.  3. The patient is to follow-up with primary care physician Dr. Jeananne Rama for evaluation of blood pressure and general medical condition following the procedure performed on today's visit.  4. Surgical evaluation as discussed.  5. Neurological evaluation as discussed.  6. F/U Dr Kasandra Knudsen as planned. 7. The patient may be a candidate for radiofrequency procedures, implantation devices and other treatment pending response to treatment performed on today's visit and follow-up evaluation.  8. The patient has been advised to adhere to proper body mechanics and to avoid activities which may exacerbate the patient's symptoms.   Return appointment to Pain Management Center as scheduled.    Review of Systems     Objective:   Physical Exam        Assessment & Plan:

## 2016-02-11 ENCOUNTER — Telehealth: Payer: Self-pay | Admitting: *Deleted

## 2016-02-11 NOTE — Telephone Encounter (Signed)
No answer with post procedure follow-up call.

## 2016-02-12 ENCOUNTER — Ambulatory Visit: Payer: Medicare Other | Admitting: Family Medicine

## 2016-02-17 ENCOUNTER — Ambulatory Visit: Payer: Medicare Other | Admitting: Unknown Physician Specialty

## 2016-02-18 ENCOUNTER — Ambulatory Visit (INDEPENDENT_AMBULATORY_CARE_PROVIDER_SITE_OTHER): Payer: Medicare Other | Admitting: Family Medicine

## 2016-02-18 ENCOUNTER — Encounter: Payer: Self-pay | Admitting: Family Medicine

## 2016-02-18 VITALS — BP 135/82 | HR 62 | Temp 98.0°F | Wt 198.0 lb

## 2016-02-18 DIAGNOSIS — J449 Chronic obstructive pulmonary disease, unspecified: Secondary | ICD-10-CM | POA: Insufficient documentation

## 2016-02-18 DIAGNOSIS — B3749 Other urogenital candidiasis: Secondary | ICD-10-CM

## 2016-02-18 DIAGNOSIS — Z113 Encounter for screening for infections with a predominantly sexual mode of transmission: Secondary | ICD-10-CM

## 2016-02-18 DIAGNOSIS — I10 Essential (primary) hypertension: Secondary | ICD-10-CM | POA: Insufficient documentation

## 2016-02-18 DIAGNOSIS — E785 Hyperlipidemia, unspecified: Secondary | ICD-10-CM | POA: Insufficient documentation

## 2016-02-18 DIAGNOSIS — J441 Chronic obstructive pulmonary disease with (acute) exacerbation: Secondary | ICD-10-CM | POA: Diagnosis not present

## 2016-02-18 MED ORDER — NYSTATIN 100000 UNIT/GM EX POWD: Freq: Four times a day (QID) | CUTANEOUS | Status: DC

## 2016-02-18 MED ORDER — PREDNISONE 10 MG PO TABS: ORAL_TABLET | ORAL | Status: DC

## 2016-02-18 MED ORDER — ALBUTEROL SULFATE HFA 108 (90 BASE) MCG/ACT IN AERS: 2.0000 | INHALATION_SPRAY | Freq: Four times a day (QID) | RESPIRATORY_TRACT | Status: DC | PRN

## 2016-02-18 MED ORDER — PREDNISONE 50 MG PO TABS: ORAL_TABLET | ORAL | Status: DC

## 2016-02-18 MED ORDER — ALBUTEROL SULFATE (2.5 MG/3ML) 0.083% IN NEBU: 2.5000 mg | INHALATION_SOLUTION | Freq: Once | RESPIRATORY_TRACT | Status: DC

## 2016-02-18 MED ORDER — FLUTICASONE-SALMETEROL 250-50 MCG/DOSE IN AEPB: 1.0000 | INHALATION_SPRAY | Freq: Two times a day (BID) | RESPIRATORY_TRACT | Status: DC

## 2016-02-18 MED ORDER — AZITHROMYCIN 250 MG PO TABS: ORAL_TABLET | ORAL | Status: DC

## 2016-02-18 NOTE — Progress Notes (Signed)
BP 135/82 mmHg  Pulse 62  Temp(Src) 98 F (36.7 C)  Wt 198 lb (89.812 kg)  SpO2 95%   Subjective:    Patient ID: [name], female    DOB: 1961-04-08, 55 y.o.   MRN: [id number]  HPI: ERI MCEVERS is a 55 y.o. female  Chief Complaint  Patient presents with  . Shortness of Breath  . Rash    Patient has a rash between her legs  . Other    Unable to reconcile patients mediactions   SHORTNESS OF BREATH- CT of chest done 10/17/16 showed interstitial and obstructive lung disease, with marked progression since 05/18/14 Duration: About a week ago, ran out of her inhalers about a week ago, was taking advair, albuterol Onset: sudden Description of breathing discomfort: gasping Severity: moderate Episode duration: 5 minutes Frequency: almost all the time Related to exertion: yes Cough: yes non-productive Chest tightness: yes Wheezing: yes Fevers: no Chest pain: no Palpitations: no  Nausea: no Diaphoresis: no Deconditioning: no Status: worse Aggravating factors: moving around Alleviating factors: nothing Treatments attempted: nothing  RASH Duration:  6-7 months  Location: under pannus and groin  Itching: no Burning: no Redness: yes Oozing: no Scaling: no Blisters: no Painful: no Fevers: no Change in detergents/soaps/personal care products: no Recent illness: no Recent travel:no History of same: yes Context: stable Alleviating factors: desitin  Treatments attempted:desitin Shortness of breath: yes  Throat/tongue swelling: no Myalgias/arthralgias: no  Relevant past medical, surgical, family and social history reviewed and updated as indicated. Interim medical history since our last visit reviewed. Allergies and medications reviewed and updated.  Review of Systems  Constitutional: Negative.   HENT: Negative.   Respiratory: Positive for cough, chest tightness, shortness of breath and wheezing. Negative for apnea, choking and stridor.   Cardiovascular: Negative.    Skin: Positive for rash. Negative for color change, pallor and wound.    Per HPI unless specifically indicated above     Objective:    BP 135/82 mmHg  Pulse 62  Temp(Src) 98 F (36.7 C)  Wt 198 lb (89.812 kg)  SpO2 95%  Wt Readings from Last 3 Encounters:  02/18/16 198 lb (89.812 kg)  02/10/16 185 lb (83.915 kg)  01/30/16 185 lb (83.915 kg)    Physical Exam  Constitutional: She is oriented to person, place, and time. She appears well-developed and well-nourished. No distress.  HENT:  Head: Normocephalic and atraumatic.  Right Ear: Hearing normal.  Left Ear: Hearing normal.  Nose: Nose normal.  Eyes: Conjunctivae and lids are normal. Right eye exhibits no discharge. Left eye exhibits no discharge. No scleral icterus.  Cardiovascular: Normal rate, regular rhythm, normal heart sounds and intact distal pulses.  Exam reveals no gallop and no friction rub.   No murmur heard. Pulmonary/Chest: Effort normal. No respiratory distress. She has wheezes in the right upper field, the right middle field, the right lower field, the left upper field, the left middle field and the left lower field. She has rhonchi in the right lower field and the left lower field. She has no rales. She exhibits no tenderness.  Musculoskeletal: Normal range of motion.  Neurological: She is alert and oriented to person, place, and time.  Skin: Skin is warm, dry and intact. Rash (erythematous shiny area in groin and under pannus) noted. No erythema. No pallor.  Psychiatric: She has a normal mood and affect. Her speech is normal and behavior is normal. Judgment and thought content normal. Cognition and memory are normal.  Nursing note and vitals reviewed.   Results for orders placed or performed in visit on 01/30/16  ToxASSURE Select 46 (MW), Urine  Result Value Ref Range   Report Summary FINAL    PDF .   Please Note  Result Value Ref Range   Please note Comment       Assessment & Plan:   Problem List  Items Addressed This Visit    None    Visit Diagnoses    COPD exacerbation ([place])    -  Primary    Better following neb. Will treat with prednisone taper, azithromycin. Will restart her advair and her albuterol. Return for follow up in 2 weeks.     Relevant Medications    albuterol (PROVENTIL) (2.5 MG/3ML) 0.083% nebulizer solution 2.5 mg    Fluticasone-Salmeterol (ADVAIR DISKUS) 250-50 MCG/DOSE AEPB    azithromycin (ZITHROMAX) 250 MG tablet    albuterol (PROVENTIL HFA;VENTOLIN HFA) 108 (90 Base) MCG/ACT inhaler    predniSONE (DELTASONE) 10 MG tablet    Candida infection of genital region        Will treat with nystatin powder. Call if not getting better or getting worse.     Relevant Medications    nystatin (NYSTATIN) powder    azithromycin (ZITHROMAX) 250 MG tablet    Routine screening for STI (sexually transmitted infection)        Will check next visit with other routine labs    Relevant Orders    Hepatitis C Antibody        Follow up plan: Return in about 2 weeks (around 03/03/2016) for Follow up with PCP for regular appointment.

## 2016-02-20 DIAGNOSIS — F331 Major depressive disorder, recurrent, moderate: Secondary | ICD-10-CM | POA: Diagnosis not present

## 2016-02-20 DIAGNOSIS — F419 Anxiety disorder, unspecified: Secondary | ICD-10-CM | POA: Diagnosis not present

## 2016-02-20 DIAGNOSIS — F17200 Nicotine dependence, unspecified, uncomplicated: Secondary | ICD-10-CM | POA: Diagnosis not present

## 2016-02-20 DIAGNOSIS — Z79899 Other long term (current) drug therapy: Secondary | ICD-10-CM | POA: Diagnosis not present

## 2016-02-21 ENCOUNTER — Other Ambulatory Visit: Payer: Self-pay | Admitting: Unknown Physician Specialty

## 2016-02-21 ENCOUNTER — Other Ambulatory Visit: Payer: Self-pay | Admitting: Pain Medicine

## 2016-02-26 ENCOUNTER — Encounter: Payer: Self-pay | Admitting: Pain Medicine

## 2016-02-26 ENCOUNTER — Ambulatory Visit: Payer: Medicare Other | Attending: Pain Medicine | Admitting: Pain Medicine

## 2016-02-26 VITALS — BP 109/63 | HR 58 | Temp 98.6°F | Resp 15 | Ht 63.0 in | Wt 179.0 lb

## 2016-02-26 DIAGNOSIS — M5126 Other intervertebral disc displacement, lumbar region: Secondary | ICD-10-CM | POA: Insufficient documentation

## 2016-02-26 DIAGNOSIS — M461 Sacroiliitis, not elsewhere classified: Secondary | ICD-10-CM | POA: Diagnosis not present

## 2016-02-26 DIAGNOSIS — M545 Low back pain: Secondary | ICD-10-CM | POA: Diagnosis present

## 2016-02-26 DIAGNOSIS — M4726 Other spondylosis with radiculopathy, lumbar region: Secondary | ICD-10-CM | POA: Insufficient documentation

## 2016-02-26 DIAGNOSIS — M5416 Radiculopathy, lumbar region: Secondary | ICD-10-CM

## 2016-02-26 DIAGNOSIS — M79605 Pain in left leg: Secondary | ICD-10-CM | POA: Diagnosis present

## 2016-02-26 DIAGNOSIS — M533 Sacrococcygeal disorders, not elsewhere classified: Secondary | ICD-10-CM | POA: Insufficient documentation

## 2016-02-26 DIAGNOSIS — M5116 Intervertebral disc disorders with radiculopathy, lumbar region: Secondary | ICD-10-CM | POA: Diagnosis not present

## 2016-02-26 DIAGNOSIS — Z96653 Presence of artificial knee joint, bilateral: Secondary | ICD-10-CM

## 2016-02-26 DIAGNOSIS — M5136 Other intervertebral disc degeneration, lumbar region: Secondary | ICD-10-CM

## 2016-02-26 DIAGNOSIS — M5137 Other intervertebral disc degeneration, lumbosacral region: Secondary | ICD-10-CM

## 2016-02-26 DIAGNOSIS — M79604 Pain in right leg: Secondary | ICD-10-CM | POA: Diagnosis present

## 2016-02-26 DIAGNOSIS — M47816 Spondylosis without myelopathy or radiculopathy, lumbar region: Secondary | ICD-10-CM

## 2016-02-26 DIAGNOSIS — M47817 Spondylosis without myelopathy or radiculopathy, lumbosacral region: Secondary | ICD-10-CM | POA: Diagnosis not present

## 2016-02-26 DIAGNOSIS — M791 Myalgia: Secondary | ICD-10-CM | POA: Diagnosis not present

## 2016-02-26 MED ORDER — TAPENTADOL HCL 50 MG PO TABS: ORAL_TABLET | ORAL | Status: DC

## 2016-02-26 NOTE — Patient Instructions (Addendum)
PLAN  Continue present medication Nucynta  Lumbosacral selective nerve root block to be performed at time of return appointment  F/U PCP C Wicker for evaluation of blood pressure and general medical condition  F/U surgical evaluation. May consider pending follow-up evaluation  F/U Dr.Su as planned   F/U neurological evaluation. May consider PNCV/EMG studies and other studies pending follow-up evaluations  Radiofrequency rhizolysis lumbar facet medial branch nerves to be performed pending insurance approval  Ask the nurses and secretaries if insurance has approved you for radiofrequency  of the lumbar facet, medial branch nerve block  Patient to call [facility] Center should patient have concerns prior to scheduled return appointmentGENERAL RISKS AND COMPLICATIONS  What are the risk, side effects and possible complications? Generally speaking, most procedures are safe.  However, with any procedure there are risks, side effects, and the possibility of complications.  The risks and complications are dependent upon the sites that are lesioned, or the type of nerve block to be performed.  The closer the procedure is to the spine, the more serious the risks are.  Great care is taken when placing the radio frequency needles, block needles or lesioning probes, but sometimes complications can occur. 1. Infection: Any time there is an injection through the skin, there is a risk of infection.  This is why sterile conditions are used for these blocks.  There are four possible types of infection. 1. Localized skin infection. 2. Central Nervous System Infection-This can be in the form of Meningitis, which can be deadly. 3. Epidural Infections-This can be in the form of an epidural abscess, which can cause pressure inside of the spine, causing compression of the spinal cord with subsequent paralysis. This would require an emergency surgery to decompress, and there are no guarantees that the patient  would recover from the paralysis. 4. Discitis-This is an infection of the intervertebral discs.  It occurs in about 1% of discography procedures.  It is difficult to treat and it may lead to surgery.        2. Pain: the needles have to go through skin and soft tissues, will cause soreness.       3. Damage to internal structures:  The nerves to be lesioned may be near blood vessels or    other nerves which can be potentially damaged.       4. Bleeding: Bleeding is more common if the patient is taking blood thinners such as  aspirin, Coumadin, Ticiid, Plavix, etc., or if he/she have some genetic predisposition  such as hemophilia. Bleeding into the spinal canal can cause compression of the spinal  cord with subsequent paralysis.  This would require an emergency surgery to  decompress and there are no guarantees that the patient would recover from the  paralysis.       5. Pneumothorax:  Puncturing of a lung is a possibility, every time a needle is introduced in  the area of the chest or upper back.  Pneumothorax refers to free air around the  collapsed lung(s), inside of the thoracic cavity (chest cavity).  Another two possible  complications related to a similar event would include: Hemothorax and Chylothorax.   These are variations of the Pneumothorax, where instead of air around the collapsed  lung(s), you may have blood or chyle, respectively.       6. Spinal headaches: They may occur with any procedures in the area of the spine.       7. Persistent CSF (Cerebro-Spinal Fluid) leakage:  This is a rare problem, but may occur  with prolonged intrathecal or epidural catheters either due to the formation of a fistulous  track or a dural tear.       8. Nerve damage: By working so close to the spinal cord, there is always a possibility of  nerve damage, which could be as serious as a permanent spinal cord injury with  paralysis.       9. Death:  Although rare, severe deadly allergic reactions known as  "Anaphylactic  reaction" can occur to any of the medications used.      10. Worsening of the symptoms:  We can always make thing worse.  What are the chances of something like this happening? Chances of any of this occuring are extremely low.  By statistics, you have more of a chance of getting killed in a motor vehicle accident: while driving to the hospital than any of the above occurring .  Nevertheless, you should be aware that they are possibilities.  In general, it is similar to taking a shower.  Everybody knows that you can slip, hit your head and get killed.  Does that mean that you should not shower again?  Nevertheless always keep in mind that statistics do not mean anything if you happen to be on the wrong side of them.  Even if a procedure has a 1 (one) in a 1,000,000 (million) chance of going wrong, it you happen to be that one..Also, keep in mind that by statistics, you have more of a chance of having something go wrong when taking medications.  Who should not have this procedure? If you are on a blood thinning medication (e.g. Coumadin, Plavix, see list of "Blood Thinners"), or if you have an active infection going on, you should not have the procedure.  If you are taking any blood thinners, please inform your physician.  How should I prepare for this procedure?  Do not eat or drink anything at least six hours prior to the procedure.  Bring a driver with you .  It cannot be a taxi.  Come accompanied by an adult that can drive you back, and that is strong enough to help you if your legs get weak or numb from the local anesthetic.  Take all of your medicines the morning of the procedure with just enough water to swallow them.  If you have diabetes, make sure that you are scheduled to have your procedure done first thing in the morning, whenever possible.  If you have diabetes, take only half of your insulin dose and notify our nurse that you have done so as soon as you arrive at the  clinic.  If you are diabetic, but only take blood sugar pills (oral hypoglycemic), then do not take them on the morning of your procedure.  You may take them after you have had the procedure.  Do not take aspirin or any aspirin-containing medications, at least eleven (11) days prior to the procedure.  They may prolong bleeding.  Wear loose fitting clothing that may be easy to take off and that you would not mind if it got stained with Betadine or blood.  Do not wear any jewelry or perfume  Remove any nail coloring.  It will interfere with some of our monitoring equipment.  NOTE: Remember that this is not meant to be interpreted as a complete list of all possible complications.  Unforeseen problems may occur.  BLOOD THINNERS The following drugs contain aspirin or  other products, which can cause increased bleeding during surgery and should not be taken for 2 weeks prior to and 1 week after surgery.  If you should need take something for relief of minor pain, you may take acetaminophen which is found in Tylenol,m Datril, Anacin-3 and Panadol. It is not blood thinner. The products listed below are.  Do not take any of the products listed below in addition to any listed on your instruction sheet.  A.P.C or A.P.C with Codeine Codeine Phosphate Capsules #3 Ibuprofen Ridaura  ABC compound Congesprin Imuran rimadil  Advil Cope Indocin Robaxisal  Alka-Seltzer Effervescent Pain Reliever and Antacid Coricidin or Coricidin-D  Indomethacin Rufen  Alka-Seltzer plus Cold Medicine Cosprin Ketoprofen S-A-C Tablets  Anacin Analgesic Tablets or Capsules Coumadin Korlgesic Salflex  Anacin Extra Strength Analgesic tablets or capsules CP-2 Tablets Lanoril Salicylate  Anaprox Cuprimine Capsules Levenox Salocol  Anexsia-D Dalteparin Magan Salsalate  Anodynos Darvon compound Magnesium Salicylate Sine-off  Ansaid Dasin Capsules Magsal Sodium Salicylate  Anturane Depen Capsules Marnal Soma  APF Arthritis pain  formula Dewitt's Pills Measurin Stanback  Argesic Dia-Gesic Meclofenamic Sulfinpyrazone  Arthritis Bayer Timed Release Aspirin Diclofenac Meclomen Sulindac  Arthritis pain formula Anacin Dicumarol Medipren Supac  Analgesic (Safety coated) Arthralgen Diffunasal Mefanamic Suprofen  Arthritis Strength Bufferin Dihydrocodeine Mepro Compound Suprol  Arthropan liquid Dopirydamole Methcarbomol with Aspirin Synalgos  ASA tablets/Enseals Disalcid Micrainin Tagament  Ascriptin Doan's Midol Talwin  Ascriptin A/D Dolene Mobidin Tanderil  Ascriptin Extra Strength Dolobid Moblgesic Ticlid  Ascriptin with Codeine Doloprin or Doloprin with Codeine Momentum Tolectin  Asperbuf Duoprin Mono-gesic Trendar  Aspergum Duradyne Motrin or Motrin IB Triminicin  Aspirin plain, buffered or enteric coated Durasal Myochrisine Trigesic  Aspirin Suppositories Easprin Nalfon Trillsate  Aspirin with Codeine Ecotrin Regular or Extra Strength Naprosyn Uracel  Atromid-S Efficin Naproxen Ursinus  Auranofin Capsules Elmiron Neocylate Vanquish  Axotal Emagrin Norgesic Verin  Azathioprine Empirin or Empirin with Codeine Normiflo Vitamin E  Azolid Emprazil Nuprin Voltaren  Bayer Aspirin plain, buffered or children's or timed BC Tablets or powders Encaprin Orgaran Warfarin Sodium  Buff-a-Comp Enoxaparin Orudis Zorpin  Buff-a-Comp with Codeine Equegesic Os-Cal-Gesic   Buffaprin Excedrin plain, buffered or Extra Strength Oxalid   Bufferin Arthritis Strength Feldene Oxphenbutazone   Bufferin plain or Extra Strength Feldene Capsules Oxycodone with Aspirin   Bufferin with Codeine Fenoprofen Fenoprofen Pabalate or Pabalate-SF   Buffets II Flogesic Panagesic   Buffinol plain or Extra Strength Florinal or Florinal with Codeine Panwarfarin   Buf-Tabs Flurbiprofen Penicillamine   Butalbital Compound Four-way cold tablets Penicillin   Butazolidin Fragmin Pepto-Bismol   Carbenicillin Geminisyn Percodan   Carna Arthritis Reliever Geopen  Persantine   Carprofen Gold's salt Persistin   Chloramphenicol Goody's Phenylbutazone   Chloromycetin Haltrain Piroxlcam   Clmetidine heparin Plaquenil   Cllnoril Hyco-pap Ponstel   Clofibrate Hydroxy chloroquine Propoxyphen         Before stopping any of these medications, be sure to consult the physician who ordered them.  Some, such as Coumadin (Warfarin) are ordered to prevent or treat serious conditions such as "deep thrombosis", "pumonary embolisms", and other heart problems.  The amount of time that you may need off of the medication may also vary with the medication and the reason for which you were taking it.  If you are taking any of these medications, please make sure you notify your pain physician before you undergo any procedures.         Selective Nerve Root Block Patient Information  Description: Specific  nerve roots exit the spinal canal and these nerves can be compressed and inflamed by a bulging disc and bone spurs.  By injecting steroids on the nerve root, we can potentially decrease the inflammation surrounding these nerves, which often leads to decreased pain.  Also, by injecting local anesthesia on the nerve root, this can provide [place] helpful information to give to your referring doctor if it decreases your pain.  Selective nerve root blocks can be done along the spine from the neck to the low back depending on the location of your pain.   After numbing the skin with local anesthesia, a small needle is passed to the nerve root and the position of the needle is verified using x-ray pictures.  After the needle is in correct position, we then deposit the medication.  You may experience a pressure sensation while this is being done.  The entire block usually lasts less than 15 minutes.  Conditions that may be treated with selective nerve root blocks:  Low back and leg pain  Spinal stenosis  Diagnostic block prior to potential surgery  Neck and arm pain  Post  laminectomy syndrome  Preparation for the injection:  1. Do not eat any solid food or dairy products within 8 hours of your appointment. 2. You may drink clear liquids up to 3 hours before an appointment.  Clear liquids include water, black coffee, juice or soda.  No milk or cream please. 3. You may take your regular medications, including pain medications, with a sip of water before your appointment.  Diabetics should hold regular insulin (if taken separately) and take 1/2 normal NPH dose the morning of the procedure.  Carry some sugar containing items with you to your appointment. 4. A driver must accompany you and be prepared to drive you home after your procedure. 5. Bring all your current medications with you. 6. An IV may be inserted and sedation may be given at the discretion of the physician. 7. A blood pressure cuff, EKG, and other monitors will often be applied during the procedure.  Some patients may need to have extra oxygen administered for a short period. 8. You will be asked to provide medical information, including allergies, prior to the procedure.  We must know immediately if you are taking blood  Thinners (like Coumadin) or if you are allergic to IV iodine contrast (dye).  Possible side-effects: All are usually temporary  Bleeding from needle site  Light headedness  Numbness and tingling  Decreased blood pressure  Weakness in arms/legs  Pressure sensation in back/neck  Pain at injection site (several days)  Possible complications: All are extremely rare  Infection  Nerve injury  Spinal headache (a headache wore with upright position)  Call if you experience:  Fever/chills associated with headache or increased back/neck pain  Headache worsened by an upright position  New onset weakness or numbness of an extremity below the injection site  Hives or difficulty breathing (go to the emergency room)  Inflammation or drainage at the injection  site(s)  Severe back/neck pain greater than usual  New symptoms which are concerning to you  Please note:  Although the local anesthetic injected can often make your back or neck feel good for several hours after the injection the pain will likely return.  It takes 3-5 days for steroids to work on the nerve root. You may not notice any pain relief for at least one week.  If effective, we will often do a series of 3  injections spaced 3-6 weeks apart to maximally decrease your pain.    If you have any questions, please call [phone] [facility]

## 2016-02-26 NOTE — Progress Notes (Signed)
Safety precautions to be maintained throughout the outpatient stay will include: orient to surroundings, keep bed in low position, maintain call bell within reach at all times, provide assistance with transfer out of bed and ambulation.  

## 2016-02-26 NOTE — Progress Notes (Signed)
   Subjective:    Patient ID: Susan Houston, female    DOB: [birth year], 55 y.o.   MRN: [id number]  HPI  The patient is a 55 year old female who returns to pain management for further evaluation and treatment of pain involving the lower back and lower extremity regions. The patient has had improvement of pain due to significant degree following previous treatment performed in pain management center. The patient denies any trauma change in events of daily living the call significant change in symptomatology. The patient is tolerating Nucynta without undesirable side effects. The patient states that her pain radiation the lumbar region and continues to the lower extremity. The patient states the pain becomes more intense near the end of the day patient has been more time on the feet the patient states that the pain is more intense wishes spent significant time on the feet. We discussed patient's condition and we will proceed with lumbosacral selective nerve root block at time return appointment in attempt to decrease severely disabling pain radiating to the lower extremity. The patient was with understanding and agreed with suggested treatment plan  Review of Systems     Objective:   Physical Exam  Palpation over the splenius capitis and occipitalis musculature region reproduced mild discomfort. There was mild tenderness of the cervical facet cervical paraspinal musculature region. Palpation of the acromioclavicular and glenohumeral joint regions reproduces mild discomfort. The patient appeared to be with unremarkable Spurling's maneuver. The patient was with bilaterally equal grip strength and Tinel and Phalen's maneuver reproducing minimal discomfort. Palpation of the thoracic region was with tenderness to palpation with no crepitus of the thoracic region noted. There was moderate muscle spasms noted in the lower thoracic paraspinal musculature region. Palpation over the lumbar paraspinal musculatures and  lumbar facet region reproduces severe pain with severe tenderness over the region of the gluteal and piriformis musculature regions as well. Straight leg raising was tolerates approximately 20 without a questionable increase of pain with dorsiflexion noted. EHL strength appeared to be decreased. No definite sensory deficit of dermatomal distribution detected. The patient had difficulty attending to stand on tiptoes and heels. Palpation of the PSIS and PII S region reproduced pain of moderate degree. There was negative clonus negative Homans. Abdomen nontender with no costovertebral tenderness noted      Assessment & Plan:      Degenerative disc disease lumbar spine Degenerative changes L4-5 disc desiccation, circumferential bulging and focal protrusion in the left neural foramen to extraforaminal region with facet and ligamentous hypertrophy, L5-S1 facet degeneration with no disc abnormalities.  Lumbar radiculopathy  Lumbar facet syndrome  Sacroiliac joint dysfunction    PLAN  Continue present medication Nucynta  Lumbosacral selective nerve root block to be performed at time of return appointment  F/U PCP C Wicker for evaluation of blood pressure and general medical condition  F/U surgical evaluation. May consider pending follow-up evaluation  F/U Dr.Su as planned   F/U neurological evaluation. May consider PNCV/EMG studies and other studies pending follow-up evaluations  Radiofrequency rhizolysis lumbar facet medial branch nerves to be performed pending insurance approval  Ask the nurses and secretaries if insurance has approved you for radiofrequency  of the lumbar facet, medial branch nerve block  Patient to call Pain Management Center should patient have concerns prior to scheduled return appointment

## 2016-03-03 ENCOUNTER — Ambulatory Visit (INDEPENDENT_AMBULATORY_CARE_PROVIDER_SITE_OTHER): Payer: Medicare Other | Admitting: Unknown Physician Specialty

## 2016-03-03 ENCOUNTER — Encounter: Payer: Self-pay | Admitting: Unknown Physician Specialty

## 2016-03-03 VITALS — BP 111/75 | HR 67 | Temp 98.2°F | Ht 62.5 in | Wt 200.0 lb

## 2016-03-03 DIAGNOSIS — Z113 Encounter for screening for infections with a predominantly sexual mode of transmission: Secondary | ICD-10-CM | POA: Diagnosis not present

## 2016-03-03 DIAGNOSIS — Z720 Tobacco use: Secondary | ICD-10-CM

## 2016-03-03 DIAGNOSIS — F17200 Nicotine dependence, unspecified, uncomplicated: Secondary | ICD-10-CM | POA: Insufficient documentation

## 2016-03-03 DIAGNOSIS — Z0000 Encounter for general adult medical examination without abnormal findings: Secondary | ICD-10-CM

## 2016-03-03 DIAGNOSIS — J449 Chronic obstructive pulmonary disease, unspecified: Secondary | ICD-10-CM | POA: Diagnosis not present

## 2016-03-03 MED ORDER — IPRATROPIUM-ALBUTEROL 0.5-2.5 (3) MG/3ML IN SOLN: 3.0000 mL | RESPIRATORY_TRACT | Status: DC | PRN

## 2016-03-03 NOTE — Assessment & Plan Note (Signed)
Watched inhaler technique and showing poor use and not really coachable due to breath effort.  Continue Advair and we will add duonebs up to 4 times/day

## 2016-03-03 NOTE — Assessment & Plan Note (Signed)
Trying to quit.  Meds through psychiatry

## 2016-03-03 NOTE — Progress Notes (Signed)
   BP 111/75 mmHg  Pulse 67  Temp(Src) 98.2 F (36.8 C)  Ht 5' 2.5" (1.588 m)  Wt 200 lb (90.719 kg)  BMI 35.97 kg/m2  SpO2 94%   Subjective:    Patient ID: Susan Houston, female    DOB: [birth year], 55 y.o.   MRN: [id number]  HPI: Susan Houston is a 55 y.o. female  Chief Complaint  Patient presents with  . Pain  . Follow-up    2 week f/u  . Labs Only    Hep C order entered   COPD Taking Advair BID.  Taking Albuterol when SOB and seems like she is taking it regularly Still feels SOB Night time symptoms: yes ER visits since last visit: no Missed work or school::disabled Increased cough: yes/non productive.   Using O2: no Smoking 1/2 ppd  Rash  Using a powder that "doesn't stay on."  States rash is not better.    Pain In lower back.  Seeing Dr. Josefa Half.    Relevant past medical, surgical, family and social history reviewed and updated as indicated. Interim medical history since our last visit reviewed. Allergies and medications reviewed and updated.  Review of Systems  Per HPI unless specifically indicated above     Objective:    BP 111/75 mmHg  Pulse 67  Temp(Src) 98.2 F (36.8 C)  Ht 5' 2.5" (1.588 m)  Wt 200 lb (90.719 kg)  BMI 35.97 kg/m2  SpO2 94%  Wt Readings from Last 3 Encounters:  03/03/16 200 lb (90.719 kg)  02/26/16 179 lb (81.194 kg)  02/18/16 198 lb (89.812 kg)    Physical Exam  Constitutional: She is oriented to person, place, and time. She appears well-developed and well-nourished. No distress.  HENT:  Head: Normocephalic and atraumatic.  Eyes: Conjunctivae and lids are normal. Right eye exhibits no discharge. Left eye exhibits no discharge. No scleral icterus.  Cardiovascular: Normal rate.   Pulmonary/Chest: Effort normal.  Abdominal: Normal appearance. There is no splenomegaly or hepatomegaly.  Musculoskeletal: Normal range of motion.  Neurological: She is alert and oriented to person, place, and time.  Skin: Skin is warm, dry and  intact. No pallor.  Mild erythema under pannus and groin areas  Psychiatric: She has a normal mood and affect. Her behavior is normal. Judgment and thought content normal.       Assessment & Plan:   Problem List Items Addressed This Visit      Unprioritized   COPD (chronic obstructive pulmonary disease) ([place])    Watched inhaler technique and showing poor use and not really coachable due to breath effort.  Continue Advair and we will add duonebs up to 4 times/day      Relevant Medications   ipratropium-albuterol (DUONEB) 0.5-2.5 (3) MG/3ML SOLN   Smoker    Trying to quit.  Meds through psychiatry       Other Visit Diagnoses    Health care maintenance    -  Primary    Routine screening for STI (sexually transmitted infection)        Will check next visit with other routine labs        Follow up plan: Return in about 4 weeks (around 03/31/2016).

## 2016-03-04 ENCOUNTER — Encounter: Payer: Self-pay | Admitting: Family Medicine

## 2016-03-04 ENCOUNTER — Other Ambulatory Visit: Payer: Self-pay | Admitting: Unknown Physician Specialty

## 2016-03-04 LAB — HEPATITIS C ANTIBODY: Hep C Virus Ab: 0.1 s/co ratio (ref 0.0–0.9)

## 2016-03-04 MED ORDER — NYSTATIN 100000 UNIT/GM EX OINT: 1.0000 "application " | TOPICAL_OINTMENT | Freq: Two times a day (BID) | CUTANEOUS | Status: DC

## 2016-03-09 ENCOUNTER — Ambulatory Visit: Payer: Medicare Other | Attending: Pain Medicine | Admitting: Pain Medicine

## 2016-03-09 ENCOUNTER — Encounter: Payer: Self-pay | Admitting: Pain Medicine

## 2016-03-09 VITALS — BP 97/59 | HR 63 | Temp 97.0°F | Resp 14 | Ht 63.0 in | Wt 175.0 lb

## 2016-03-09 DIAGNOSIS — M545 Low back pain: Secondary | ICD-10-CM | POA: Insufficient documentation

## 2016-03-09 DIAGNOSIS — M533 Sacrococcygeal disorders, not elsewhere classified: Secondary | ICD-10-CM

## 2016-03-09 DIAGNOSIS — M5416 Radiculopathy, lumbar region: Secondary | ICD-10-CM

## 2016-03-09 DIAGNOSIS — M5137 Other intervertebral disc degeneration, lumbosacral region: Secondary | ICD-10-CM

## 2016-03-09 DIAGNOSIS — M5126 Other intervertebral disc displacement, lumbar region: Secondary | ICD-10-CM | POA: Insufficient documentation

## 2016-03-09 DIAGNOSIS — M5136 Other intervertebral disc degeneration, lumbar region: Secondary | ICD-10-CM | POA: Insufficient documentation

## 2016-03-09 DIAGNOSIS — Z96653 Presence of artificial knee joint, bilateral: Secondary | ICD-10-CM

## 2016-03-09 DIAGNOSIS — M47816 Spondylosis without myelopathy or radiculopathy, lumbar region: Secondary | ICD-10-CM

## 2016-03-09 MED ORDER — LACTATED RINGERS IV SOLN: 1000.0000 mL | INTRAVENOUS | Status: DC

## 2016-03-09 MED ORDER — CEFUROXIME AXETIL 250 MG PO TABS: 250.0000 mg | ORAL_TABLET | Freq: Two times a day (BID) | ORAL | Status: DC

## 2016-03-09 MED ORDER — BUPIVACAINE HCL (PF) 0.25 % IJ SOLN
30.0000 mL | Freq: Once | INTRAMUSCULAR | Status: AC
  Administered 2016-03-09: 30 mL
  Filled 2016-03-09: qty 30

## 2016-03-09 MED ORDER — CEFAZOLIN SODIUM 1 G IJ SOLR
INTRAMUSCULAR | Status: AC
  Administered 2016-03-09: 08:00:00
  Filled 2016-03-09: qty 10

## 2016-03-09 MED ORDER — LIDOCAINE HCL (PF) 1 % IJ SOLN
10.0000 mL | Freq: Once | INTRAMUSCULAR | Status: AC
  Administered 2016-03-09: 10 mL via SUBCUTANEOUS
  Filled 2016-03-09: qty 10

## 2016-03-09 MED ORDER — ORPHENADRINE CITRATE 30 MG/ML IJ SOLN
60.0000 mg | Freq: Once | INTRAMUSCULAR | Status: AC
  Administered 2016-03-09: 60 mg via INTRAMUSCULAR
  Filled 2016-03-09: qty 2

## 2016-03-09 MED ORDER — FENTANYL CITRATE (PF) 100 MCG/2ML IJ SOLN
100.0000 ug | Freq: Once | INTRAMUSCULAR | Status: AC
  Administered 2016-03-09: 100 ug via INTRAVENOUS
  Filled 2016-03-09: qty 2

## 2016-03-09 MED ORDER — MIDAZOLAM HCL 5 MG/5ML IJ SOLN
5.0000 mg | Freq: Once | INTRAMUSCULAR | Status: AC
  Administered 2016-03-09: 2 mg via INTRAVENOUS
  Filled 2016-03-09: qty 5

## 2016-03-09 MED ORDER — TRIAMCINOLONE ACETONIDE 40 MG/ML IJ SUSP
40.0000 mg | Freq: Once | INTRAMUSCULAR | Status: AC
  Administered 2016-03-09: 40 mg
  Filled 2016-03-09: qty 1

## 2016-03-09 MED ORDER — CEFAZOLIN SODIUM 1-5 GM-% IV SOLN
1.0000 g | Freq: Once | INTRAVENOUS | Status: AC
  Administered 2016-03-09: 1 g via INTRAVENOUS

## 2016-03-09 NOTE — Patient Instructions (Addendum)
PLAN  Continue present medication Nucynta and begin taking antibiotic Ceftin as prescribed. Please obtain your Ceftin antibiotic today and begin taking antibiotic today  F/U PCP Dr. Jeananne Rama for evaliation of  BP and general medical  condition. . Please follow-up with Dr. Jeananne Rama and Katheren Puller regarding low blood pressure and general medical condition as discussed   F/U surgical evaluation as discussed  F/U Dr.Su as planned   F/U neurological evaluation. May consider PNCV/EMG studies and other studies pending follow-up evaluations  May consider radiofrequency rhizolysis or intraspinal procedures pending response to present treatment and F/U evaluation.  Patient to call Pain Management Center should patient have concerns prior to scheduled return appointment.  Pain Management Discharge Instructions  General Discharge Instructions :  If you need to reach your doctor call: Monday-Friday 8:00 am - 4:00 pm at [phone] or toll free [phone].  After clinic hours [phone] to have operator reach doctor.  Bring all of your medication bottles to all your appointments in the pain clinic.  To cancel or reschedule your appointment with Pain Management please remember to call 24 hours in advance to avoid a fee.  Refer to the educational materials which you have been given on: General Risks, I had my Procedure. Discharge Instructions, Post Sedation.  Post Procedure Instructions:  The drugs you were given will stay in your system until tomorrow, so for the next 24 hours you should not drive, make any legal decisions or drink any alcoholic beverages.  You may eat anything you prefer, but it is better to start with liquids then soups and crackers, and gradually work up to solid foods.  Please notify your doctor immediately if you have any unusual bleeding, trouble breathing or pain that is not related to your normal pain.  Depending on the type of procedure that was done, some parts of  your body may feel week and/or numb.  This usually clears up by tonight or the next day.  Walk with the use of an assistive device or accompanied by an adult for the 24 hours.  You may use ice on the affected area for the first 24 hours.  Put ice in a Ziploc bag and cover with a towel and place against area 15 minutes on 15 minutes off.  You may switch to heat after 24 hours.  A prescription for ceftin was sent to your pharmacy and should be available for pickup today.

## 2016-03-09 NOTE — Progress Notes (Signed)
Safety precautions to be maintained throughout the outpatient stay will include: orient to surroundings, keep bed in low position, maintain call bell within reach at all times, provide assistance with transfer out of bed and ambulation.  

## 2016-03-09 NOTE — Progress Notes (Signed)
Subjective:    Patient ID: Susan Houston, female    DOB: 07-24-1961, 55 y.o.   MRN: [id number]  HPI  PROCEDURE PERFORMED: Lumbosacral selective nerve root block   NOTE: The patient is a 55 y.o. female who returns to [facility] for further evaluation and treatment of pain involving the lumbar and lower extremity region. Studies consisting of MRI has revealed the patient to be with evidence of Degenerative disc disease lumbar spine Degenerative changes L4-5 disc desiccation, circumferential bulging and focal protrusion in the left neural foramen to extraforaminal region with facet and ligamentous hypertrophy, L5-S1 facet degeneration with no disc abnormalities... There is concern regarding intraspinal abnormalities contributing to the patient's symptomatology with concern regarding significant component of patient's pain being due to lumbar radiculopathy. The risks, benefits, and expectations of the procedure have been explained to the patient who was understanding and in agreement with suggested treatment plan. We will proceed with interventional treatment as discussed and as explained to the patient. The patient is understanding and in agreement with suggested treatment plan.   DESCRIPTION OF PROCEDURE: Lumbosacral selective nerve root block with IV Versed, IV fentanyl conscious sedation, EKG, blood pressure, pulse, capnography, and pulse oximetry monitoring. The procedure was performed with the patient in the prone position under fluoroscopic guidance. With the patient in the prone position, Betadine prep of proposed entry site was performed. Local anesthetic skin wheal of proposed needle entry site was prepared with 1.5% plain lidocaine with AP view of the lumbosacral spine.   PROCEDURE #1: Needle placement at the left L 2 vertebral body: A 22 -gauge needle was inserted at the inferior border of the transverse process of the vertebral body with needle placed medial to the midline of the  transverse process on AP view of the lumbosacral spine.   NEEDLE PLACEMENT AT  L3, L4, and L5  VERTEBRAL BODY LEVELS  Needle  placement was accomplished at L3, L4, and L5  vertebral body levels on the left side exactly as was accomplished at the L2  vertebral body level  and utilizing the same technique and under fluoroscopic guidance.  PROCEDURE #4: Needle placement at the S1 foramen. With the patient in the prone position with Betadine prep of proposed entry site accomplished, the S1 foramen was visualized under fluoroscopic guidance with AP view of the lumbosacral spine with cephalad orientation of the fluoroscope with local anesthetic skin wheal of 1.5% lidocaine of proposed needle entry site prepared. A 22-gauge needle was inserted S1 foramen under fluoroscopic guidance eliciting paresthesias radiating from the buttocks to the lower extremity after which needle was slightly withdrawn.   Needle placement was then verified on lateral view at all levels with needle tip documented to be in the posterior superior quadrant of the intervertebral foramen of  L 2, L3, L4, and L5. Following negative aspiration for heme and CSF at each level, each level was injected with 3 mL of 0.25% bupivacaine with Kenalog.  Myoneural block injections of the lumbar paraspinal musculature region Following Betadine prep of proposed entry site a 22-gauge needle was inserted into the lumbar paraspinal musculature region and following negative aspiration 2 cc of 0.25% bupivacaine with Norflex was injected for myoneural block injection of the lumbar paraspinal musculature region times two.   The patient tolerated the procedure well.   A total of 10 mg of Kenalog was utilized for the procedure.   PLAN:  1. Medications: The patient will continue presently prescribed medication Nucynta 2. The patient is  to undergo follow-up evaluation with PCP Dr. Jeananne Rama and  3. C Wicker for evaluation of blood pressure and general  medical condition status post procedure performed on today's visit. 4. Surgical follow-up evaluation. Has been addressed 5. Neurological evaluation. May consider PNCV EMG studies and other studies 6. F/U Dr.Su as planned  7. May consider radiofrequency procedures, implantation type procedures and other treatment pending response to treatment and follow-up evaluation. 8. The patient has been advise do adhere to proper body mechanics and avoid activities which may aggravate condition. 9. The patient has been advised to call the Pain Management Center prior to scheduled return appointment should there be significant change in the patient's condition or should the patient have other concerns regarding condition prior to scheduled return appointment.   Review of Systems     Objective:   Physical Exam        Assessment & Plan:

## 2016-03-10 ENCOUNTER — Telehealth: Payer: Self-pay | Admitting: *Deleted

## 2016-03-10 NOTE — Telephone Encounter (Signed)
Left message

## 2016-03-11 ENCOUNTER — Other Ambulatory Visit: Payer: Self-pay | Admitting: Family Medicine

## 2016-03-25 ENCOUNTER — Encounter: Payer: Self-pay | Admitting: Pain Medicine

## 2016-03-25 ENCOUNTER — Ambulatory Visit: Payer: Medicare Other | Attending: Pain Medicine | Admitting: Pain Medicine

## 2016-03-25 VITALS — BP 103/62 | HR 52 | Temp 98.6°F | Resp 16 | Ht 63.0 in | Wt 173.0 lb

## 2016-03-25 DIAGNOSIS — M79604 Pain in right leg: Secondary | ICD-10-CM | POA: Diagnosis present

## 2016-03-25 DIAGNOSIS — M5126 Other intervertebral disc displacement, lumbar region: Secondary | ICD-10-CM | POA: Diagnosis not present

## 2016-03-25 DIAGNOSIS — M5116 Intervertebral disc disorders with radiculopathy, lumbar region: Secondary | ICD-10-CM | POA: Insufficient documentation

## 2016-03-25 DIAGNOSIS — M533 Sacrococcygeal disorders, not elsewhere classified: Secondary | ICD-10-CM | POA: Diagnosis not present

## 2016-03-25 DIAGNOSIS — M47816 Spondylosis without myelopathy or radiculopathy, lumbar region: Secondary | ICD-10-CM

## 2016-03-25 DIAGNOSIS — M5416 Radiculopathy, lumbar region: Secondary | ICD-10-CM

## 2016-03-25 DIAGNOSIS — M5136 Other intervertebral disc degeneration, lumbar region: Secondary | ICD-10-CM

## 2016-03-25 DIAGNOSIS — M5137 Other intervertebral disc degeneration, lumbosacral region: Secondary | ICD-10-CM

## 2016-03-25 DIAGNOSIS — M47896 Other spondylosis, lumbar region: Secondary | ICD-10-CM | POA: Diagnosis not present

## 2016-03-25 DIAGNOSIS — Z96653 Presence of artificial knee joint, bilateral: Secondary | ICD-10-CM

## 2016-03-25 DIAGNOSIS — M791 Myalgia: Secondary | ICD-10-CM | POA: Diagnosis not present

## 2016-03-25 DIAGNOSIS — M47817 Spondylosis without myelopathy or radiculopathy, lumbosacral region: Secondary | ICD-10-CM | POA: Diagnosis not present

## 2016-03-25 DIAGNOSIS — M545 Low back pain: Secondary | ICD-10-CM | POA: Diagnosis not present

## 2016-03-25 DIAGNOSIS — M79605 Pain in left leg: Secondary | ICD-10-CM | POA: Diagnosis present

## 2016-03-25 DIAGNOSIS — M461 Sacroiliitis, not elsewhere classified: Secondary | ICD-10-CM | POA: Diagnosis not present

## 2016-03-25 MED ORDER — TAPENTADOL HCL 50 MG PO TABS: ORAL_TABLET | ORAL | Status: DC

## 2016-03-25 NOTE — Patient Instructions (Addendum)
PLAN  Continue present medication Nucynta  F/U PCP C Wicker for evaluation of blood pressure, lower extremity lesions as discussed and for general medical condition  F/U surgical evaluation. May consider pending follow-up evaluation  F/U Dr.Su as planned and as discussed  F/U neurological evaluation. May consider PNCV/EMG studies and other studies pending follow-up evaluations  Radiofrequency rhizolysis lumbar facet medial branch nerves to be performed pending insurance approval as previously discussed  Ask the nurses and secretaries if insurance has approved you for radiofrequency  of the lumbar facets, medial branch nerves  Patient to call Pain Management Center should patient have concerns prior to scheduled return appointment

## 2016-03-25 NOTE — Progress Notes (Signed)
Subjective:    Patient ID: Susan Houston, female    DOB: Aug 05, 1961, 55 y.o.   MRN: [id number]  HPI  The patient is a 55 year old female who returns to pain management for further evaluation and treatment of pain involving the lower back and lower extremity regions. The patient admits to improvement of pain involving the lower back and lower extremity region following lumbosacral selective nerve root block. The patient stated that the pain was of less severity when standing walking and that pain was without significant radiation to the lower extremity at this time. The patient continues Nucynta. We have discussed additional interventional treatment which we will avoid at this time. The patient will continue presently prescribed medications and will call pain management should they be significant change in condition prior to scheduled return appointment. The patient is without plans for surgical intervention. We have discussed radiofrequency procedures as well as implantation type procedures and May consider patient for additional radiofrequency procedures as discussed. We will continue Nucynta as prescribed at this time and we will remain available to consider additional modifications of treatment regimen pending response to treatment and follow-up evaluation. All agreed to suggested treatment plan    Review of Systems     Objective:   Physical Exam   There was tends to palpation of the splenius capitis and occipitalis region palpation which be produced mild discomfort with mild tenderness over the cervical facet cervical paraspinal musculature region. There was mild tenderness over the region of the thoracic facet thoracic paraspinal musculature region without crepitus of the thoracic region noted. Palpation over the acromioclavicular and glenohumeral joint regions reproduce mild discomfort and patient appeared to be with unremarkable Spurling's maneuver and was with slightly decreased grip  strength. Tinel and Phalen's maneuver were without increased pain of significant degree. Palpation over the region of the lumbar paraspinal musculatures and lumbar facet region was attends to palpation of moderate degree with lateral bending rotation extension and palpation over the lumbar facets reproducing moderate discomfort. There was moderate tenderness of the PSIS and PII S region as well as the gluteal and piriformis musculature region with mild tenderness on the greater trochanteric region iliotibial band region. There were healing scars of the lower extremities distal third of the left lower extremity and the right lower extremity. No increased warmth erythema of the lower extremities noted. EHL strength appeared to be decreased. No sensory deficit or dermatomal distribution detected. There was negative clonus negative Homans. With crepitus of the knees with mild tenderness and with negative anterior and posterior drawer signs without ballottement of the patella. Abdomen was nontender and no costovertebral tenderness noted.     Assessment & Plan:     Degenerative disc disease lumbar spine Degenerative changes L4-5 disc desiccation, circumferential bulging and focal protrusion in the left neural foramen to extraforaminal region with facet and ligamentous hypertrophy, L5-S1 facet degeneration with no disc abnormalities.  Lumbar radiculopathy  Lumbar facet syndrome  Sacroiliac joint dysfunction      PLAN  Continue present medication Nucynta  F/U PCP C Wicker for evaluation of blood pressure, lower extremity lesions as discussed and for general medical condition  F/U surgical evaluation. May consider pending follow-up evaluation  F/U Dr.Su as planned and as discussed  F/U neurological evaluation. May consider PNCV/EMG studies and other studies pending follow-up evaluations  Radiofrequency rhizolysis lumbar facet medial branch nerves to be performed pending insurance approval as  previously discussed  Ask the nurses and secretaries if insurance has approved you  for radiofrequency  of the lumbar facets, medial branch nerves  Patient to call Pain Management Center should patient have concerns prior to scheduled return appointment

## 2016-03-25 NOTE — Progress Notes (Signed)
Patient here for medication management Safety precautions to be maintained throughout the outpatient stay will include: orient to surroundings, keep bed in low position, maintain call bell within reach at all times, provide assistance with transfer out of bed and ambulation.

## 2016-03-30 ENCOUNTER — Ambulatory Visit: Payer: Self-pay | Admitting: Pain Medicine

## 2016-04-01 LAB — TOXASSURE SELECT 13 (MW), URINE: PDF: 0

## 2016-04-03 ENCOUNTER — Other Ambulatory Visit: Payer: Self-pay | Admitting: Family Medicine

## 2016-04-03 NOTE — Telephone Encounter (Signed)
Your patient 

## 2016-04-06 ENCOUNTER — Ambulatory Visit: Payer: Self-pay | Admitting: Pain Medicine

## 2016-04-06 ENCOUNTER — Telehealth: Payer: Self-pay | Admitting: Unknown Physician Specialty

## 2016-04-06 NOTE — Telephone Encounter (Signed)
Called and gave patient [name]'s instructions.

## 2016-04-06 NOTE — Telephone Encounter (Signed)
Called and spoke to patient. She stated that the last time she voided was about 3 am and would like to know what to do.

## 2016-04-06 NOTE — Telephone Encounter (Signed)
Pt called stated she needs to speak with [name]'s CMA ASAP. Pt has not been able to pee since last night and needs advice. Please call her ASAP. Thanks.

## 2016-04-06 NOTE — Telephone Encounter (Signed)
If she is retaining urine she will need to fo to the ER for a cath.

## 2016-04-22 ENCOUNTER — Telehealth: Payer: Self-pay | Admitting: *Deleted

## 2016-04-22 NOTE — Telephone Encounter (Signed)
Returned pt call gave an appt reminder per her request for 04/29/2016@ 7:30am...td

## 2016-04-29 ENCOUNTER — Ambulatory Visit: Payer: Medicare Other | Attending: Pain Medicine | Admitting: Pain Medicine

## 2016-04-29 ENCOUNTER — Encounter: Payer: Self-pay | Admitting: Pain Medicine

## 2016-04-29 VITALS — BP 124/70 | HR 58 | Temp 98.6°F | Resp 15 | Ht 62.0 in | Wt 175.0 lb

## 2016-04-29 DIAGNOSIS — M5126 Other intervertebral disc displacement, lumbar region: Secondary | ICD-10-CM | POA: Diagnosis not present

## 2016-04-29 DIAGNOSIS — M47896 Other spondylosis, lumbar region: Secondary | ICD-10-CM | POA: Diagnosis not present

## 2016-04-29 DIAGNOSIS — M51379 Other intervertebral disc degeneration, lumbosacral region without mention of lumbar back pain or lower extremity pain: Secondary | ICD-10-CM

## 2016-04-29 DIAGNOSIS — M5416 Radiculopathy, lumbar region: Secondary | ICD-10-CM | POA: Diagnosis not present

## 2016-04-29 DIAGNOSIS — M51369 Other intervertebral disc degeneration, lumbar region without mention of lumbar back pain or lower extremity pain: Secondary | ICD-10-CM

## 2016-04-29 DIAGNOSIS — M533 Sacrococcygeal disorders, not elsewhere classified: Secondary | ICD-10-CM | POA: Diagnosis not present

## 2016-04-29 DIAGNOSIS — M5137 Other intervertebral disc degeneration, lumbosacral region: Secondary | ICD-10-CM

## 2016-04-29 DIAGNOSIS — M461 Sacroiliitis, not elsewhere classified: Secondary | ICD-10-CM | POA: Diagnosis not present

## 2016-04-29 DIAGNOSIS — M5136 Other intervertebral disc degeneration, lumbar region: Secondary | ICD-10-CM

## 2016-04-29 DIAGNOSIS — Z96653 Presence of artificial knee joint, bilateral: Secondary | ICD-10-CM

## 2016-04-29 DIAGNOSIS — M791 Myalgia: Secondary | ICD-10-CM | POA: Diagnosis not present

## 2016-04-29 DIAGNOSIS — M47816 Spondylosis without myelopathy or radiculopathy, lumbar region: Secondary | ICD-10-CM

## 2016-04-29 DIAGNOSIS — M47817 Spondylosis without myelopathy or radiculopathy, lumbosacral region: Secondary | ICD-10-CM | POA: Diagnosis not present

## 2016-04-29 MED ORDER — TAPENTADOL HCL 50 MG PO TABS: ORAL_TABLET | ORAL | Status: DC

## 2016-04-29 NOTE — Progress Notes (Signed)
Safety precautions to be maintained throughout the outpatient stay will include: orient to surroundings, keep bed in low position, maintain call bell within reach at all times, provide assistance with transfer out of bed and ambulation.  

## 2016-04-29 NOTE — Progress Notes (Signed)
   Subjective:    Patient ID: Susan Houston, female    DOB: Sep 30, 1961, 55 y.o.   MRN: [id number]  HPI  The patient is a 55 year old female who returns to pain management for further evaluation and treatment of pain involving the lower back and lower extremity region with some return of pain of the lower back region a moderate degree. The patient denies trauma change in events of daily living to cause significant change in symptomatology. The patient continues Nucynta and is with moderate pain despite present medication regimen. We discussed patient's condition and will consider patient for interventional treatment at time of return appointment. The patient states that the procedure which has been provided the most relief of pain is a lumbosacral selective nerve root block. We will proceed with lumbosacral selective nerve root block at time return appointment and will consider additional modifications of treatment regimen pending follow-up evaluations. All agreed to suggested treatment plan  Review of Systems     Objective:   Physical Exam  Palpation of the splenius capitis and occipitalis regions reproduce mild to moderate discomfort with mild to moderate tenderness of the acromioclavicular and glenohumeral joint regions as well there was mild to moderate tenderness of the cervical and thoracic paraspinal musculature region with no crepitus of the thoracic region noted. The patient was with bilaterally equal grip strength and Tinel and Phalen's maneuver were without increase of pain of significant degree. Palpation over the lumbar region was with moderate tenderness to palpation with lateral bending and rotation reproducing moderate discomfort. There was moderate tenderness of the PSIS and PII S region with mild tenderness of the greater trochanteric region iliotibial band region. Leg raising was tolerates approximately 20 without increased pain with dorsiflexion noted. DTRs were difficult to elicit EHL  strength was decreased there was no definite sensory deficit of dermatomal distribution detected. There was negative clonus negative Homans. Abdomen was nontender with no costovertebral angle tenderness     Assessment & Plan:      Degenerative disc disease lumbar spine Degenerative changes L4-5 disc desiccation, circumferential bulging and focal protrusion in the left neural foramen to extraforaminal region with facet and ligamentous hypertrophy, L5-S1 facet degeneration with no disc abnormalities.  Lumbar radiculopathy  Lumbar facet syndrome  Sacroiliac joint dysfunction     PLAN  Continue present medication Nucynta  Lumbosacral selective nerve root block to be performed at time of return appointment  F/U PCP C Wicker for evaluation of blood pressure and general medical condition  F/U surgical evaluation. May consider pending follow-up evaluation  F/U Dr.Su as planned and as discussed  F/U neurological evaluation. May consider PNCV/EMG studies and other studies pending follow-up evaluations  Radiofrequency rhizolysis lumbar facet medial branch nerves to be performed pending insurance approval as previously discussed  Ask the nurses and secretaries if insurance has approved you for radiofrequency  of the lumbar facets, medial branch nerves  Patient to call Pain Management Center should patient have concerns prior to scheduled return appointment

## 2016-04-29 NOTE — Patient Instructions (Addendum)
PLAN  Continue present medication Nucynta  Lumbosacral selective nerve root block to be performed at time of return appointment  F/U PCP C Wicker for evaluation of blood pressure and general medical condition  F/U surgical evaluation. May consider pending follow-up evaluation  F/U Dr.Su as planned and as discussed  F/U neurological evaluation. May consider PNCV/EMG studies and other studies pending follow-up evaluations  Radiofrequency rhizolysis lumbar facet medial branch nerves to be performed pending insurance approval as previously discussed  Ask the nurses and secretaries if insurance has approved you for radiofrequency  of the lumbar facets, medial branch nerves  Patient to call [facility] should patient have concerns prior to scheduled return appointmentSelective Nerve Root Block Patient Information  Description: Specific nerve roots exit the spinal canal and these nerves can be compressed and inflamed by a bulging disc and bone spurs.  By injecting steroids on the nerve root, we can potentially decrease the inflammation surrounding these nerves, which often leads to decreased pain.  Also, by injecting local anesthesia on the nerve root, this can provide [place] helpful information to give to your referring doctor if it decreases your pain.  Selective nerve root blocks can be done along the spine from the neck to the low back depending on the location of your pain.   After numbing the skin with local anesthesia, a small needle is passed to the nerve root and the position of the needle is verified using x-ray pictures.  After the needle is in correct position, we then deposit the medication.  You may experience a pressure sensation while this is being done.  The entire block usually lasts less than 15 minutes.  Conditions that may be treated with selective nerve root blocks:  Low back and leg pain  Spinal stenosis  Diagnostic block prior to potential surgery  Neck and arm  pain  Post laminectomy syndrome  Preparation for the injection:  1. Do not eat any solid food or dairy products within 8 hours of your appointment. 2. You may drink clear liquids up to 3 hours before an appointment.  Clear liquids include water, black coffee, juice or soda.  No milk or cream please. 3. You may take your regular medications, including pain medications, with a sip of water before your appointment.  Diabetics should hold regular insulin (if taken separately) and take 1/2 normal NPH dose the morning of the procedure.  Carry some sugar containing items with you to your appointment. 4. A driver must accompany you and be prepared to drive you home after your procedure. 5. Bring all your current medications with you. 6. An IV may be inserted and sedation may be given at the discretion of the physician. 7. A blood pressure cuff, EKG, and other monitors will often be applied during the procedure.  Some patients may need to have extra oxygen administered for a short period. 8. You will be asked to provide medical information, including allergies, prior to the procedure.  We must know immediately if you are taking blood  Thinners (like Coumadin) or if you are allergic to IV iodine contrast (dye).  Possible side-effects: All are usually temporary  Bleeding from needle site  Light headedness  Numbness and tingling  Decreased blood pressure  Weakness in arms/legs  Pressure sensation in back/neck  Pain at injection site (several days)  Possible complications: All are extremely rare  Infection  Nerve injury  Spinal headache (a headache wore with upright position)  Call if you experience:  Fever/chills associated with headache or increased back/neck pain  Headache worsened by an upright position  New onset weakness or numbness of an extremity below the injection site  Hives or difficulty breathing (go to the emergency room)  Inflammation or drainage at the  injection site(s)  Severe back/neck pain greater than usual  New symptoms which are concerning to you  Please note:  Although the local anesthetic injected can often make your back or neck feel good for several hours after the injection the pain will likely return.  It takes 3-5 days for steroids to work on the nerve root. You may not notice any pain relief for at least one week.  If effective, we will often do a series of 3 injections spaced 3-6 weeks apart to maximally decrease your pain.    If you have any questions, please call [phone] [facility] Clinic[facility] RISKS AND COMPLICATIONS  What are the risk, side effects and possible complications? Generally speaking, most procedures are safe.  However, with any procedure there are risks, side effects, and the possibility of complications.  The risks and complications are dependent upon the sites that are lesioned, or the type of nerve block to be performed.  The closer the procedure is to the spine, the more serious the risks are.  Great care is taken when placing the radio frequency needles, block needles or lesioning probes, but sometimes complications can occur. 1. Infection: Any time there is an injection through the skin, there is a risk of infection.  This is why sterile conditions are used for these blocks.  There are four possible types of infection. 1. Localized skin infection. 2. Central Nervous System Infection-This can be in the form of Meningitis, which can be deadly. 3. Epidural Infections-This can be in the form of an epidural abscess, which can cause pressure inside of the spine, causing compression of the spinal cord with subsequent paralysis. This would require an emergency surgery to decompress, and there are no guarantees that the patient would recover from the paralysis. 4. Discitis-This is an infection of the intervertebral discs.  It occurs in about 1% of discography procedures.  It is  difficult to treat and it may lead to surgery.        2. Pain: the needles have to go through skin and soft tissues, will cause soreness.       3. Damage to internal structures:  The nerves to be lesioned may be near blood vessels or    other nerves which can be potentially damaged.       4. Bleeding: Bleeding is more common if the patient is taking blood thinners such as  aspirin, Coumadin, Ticiid, Plavix, etc., or if he/she have some genetic predisposition  such as hemophilia. Bleeding into the spinal canal can cause compression of the spinal  cord with subsequent paralysis.  This would require an emergency surgery to  decompress and there are no guarantees that the patient would recover from the  paralysis.       5. Pneumothorax:  Puncturing of a lung is a possibility, every time a needle is introduced in  the area of the chest or upper back.  Pneumothorax refers to free air around the  collapsed lung(s), inside of the thoracic cavity (chest cavity).  Another two possible  complications related to a similar event would include: Hemothorax and Chylothorax.   These are variations of the Pneumothorax, where instead of air around the collapsed  lung(s), you  may have blood or chyle, respectively.       6. Spinal headaches: They may occur with any procedures in the area of the spine.       7. Persistent CSF (Cerebro-Spinal Fluid) leakage: This is a rare problem, but may occur  with prolonged intrathecal or epidural catheters either due to the formation of a fistulous  track or a dural tear.       8. Nerve damage: By working so close to the spinal cord, there is always a possibility of  nerve damage, which could be as serious as a permanent spinal cord injury with  paralysis.       9. Death:  Although rare, severe deadly allergic reactions known as "Anaphylactic  reaction" can occur to any of the medications used.      10. Worsening of the symptoms:  We can always make thing worse.  What are the chances of  something like this happening? Chances of any of this occuring are extremely low.  By statistics, you have more of a chance of getting killed in a motor vehicle accident: while driving to the hospital than any of the above occurring .  Nevertheless, you should be aware that they are possibilities.  In general, it is similar to taking a shower.  Everybody knows that you can slip, hit your head and get killed.  Does that mean that you should not shower again?  Nevertheless always keep in mind that statistics do not mean anything if you happen to be on the wrong side of them.  Even if a procedure has a 1 (one) in a 1,000,000 (million) chance of going wrong, it you happen to be that one..Also, keep in mind that by statistics, you have more of a chance of having something go wrong when taking medications.  Who should not have this procedure? If you are on a blood thinning medication (e.g. Coumadin, Plavix, see list of "Blood Thinners"), or if you have an active infection going on, you should not have the procedure.  If you are taking any blood thinners, please inform your physician.  How should I prepare for this procedure?  Do not eat or drink anything at least six hours prior to the procedure.  Bring a driver with you .  It cannot be a taxi.  Come accompanied by an adult that can drive you back, and that is strong enough to help you if your legs get weak or numb from the local anesthetic.  Take all of your medicines the morning of the procedure with just enough water to swallow them.  If you have diabetes, make sure that you are scheduled to have your procedure done first thing in the morning, whenever possible.  If you have diabetes, take only half of your insulin dose and notify our nurse that you have done so as soon as you arrive at the clinic.  If you are diabetic, but only take blood sugar pills (oral hypoglycemic), then do not take them on the morning of your procedure.  You may take them  after you have had the procedure.  Do not take aspirin or any aspirin-containing medications, at least eleven (11) days prior to the procedure.  They may prolong bleeding.  Wear loose fitting clothing that may be easy to take off and that you would not mind if it got stained with Betadine or blood.  Do not wear any jewelry or perfume  Remove any nail coloring.  It will interfere  with some of our monitoring equipment.  NOTE: Remember that this is not meant to be interpreted as a complete list of all possible complications.  Unforeseen problems may occur.  BLOOD THINNERS The following drugs contain aspirin or other products, which can cause increased bleeding during surgery and should not be taken for 2 weeks prior to and 1 week after surgery.  If you should need take something for relief of minor pain, you may take acetaminophen which is found in Tylenol,m Datril, Anacin-3 and Panadol. It is not blood thinner. The products listed below are.  Do not take any of the products listed below in addition to any listed on your instruction sheet.  A.P.C or A.P.C with Codeine Codeine Phosphate Capsules #3 Ibuprofen Ridaura  ABC compound Congesprin Imuran rimadil  Advil Cope Indocin Robaxisal  Alka-Seltzer Effervescent Pain Reliever and Antacid Coricidin or Coricidin-D  Indomethacin Rufen  Alka-Seltzer plus Cold Medicine Cosprin Ketoprofen S-A-C Tablets  Anacin Analgesic Tablets or Capsules Coumadin Korlgesic Salflex  Anacin Extra Strength Analgesic tablets or capsules CP-2 Tablets Lanoril Salicylate  Anaprox Cuprimine Capsules Levenox Salocol  Anexsia-D Dalteparin Magan Salsalate  Anodynos Darvon compound Magnesium Salicylate Sine-off  Ansaid Dasin Capsules Magsal Sodium Salicylate  Anturane Depen Capsules Marnal Soma  APF Arthritis pain formula Dewitt's Pills Measurin Stanback  Argesic Dia-Gesic Meclofenamic Sulfinpyrazone  Arthritis Bayer Timed Release Aspirin Diclofenac Meclomen Sulindac   Arthritis pain formula Anacin Dicumarol Medipren Supac  Analgesic (Safety coated) Arthralgen Diffunasal Mefanamic Suprofen  Arthritis Strength Bufferin Dihydrocodeine Mepro Compound Suprol  Arthropan liquid Dopirydamole Methcarbomol with Aspirin Synalgos  ASA tablets/Enseals Disalcid Micrainin Tagament  Ascriptin Doan's Midol Talwin  Ascriptin A/D Dolene Mobidin Tanderil  Ascriptin Extra Strength Dolobid Moblgesic Ticlid  Ascriptin with Codeine Doloprin or Doloprin with Codeine Momentum Tolectin  Asperbuf Duoprin Mono-gesic Trendar  Aspergum Duradyne Motrin or Motrin IB Triminicin  Aspirin plain, buffered or enteric coated Durasal Myochrisine Trigesic  Aspirin Suppositories Easprin Nalfon Trillsate  Aspirin with Codeine Ecotrin Regular or Extra Strength Naprosyn Uracel  Atromid-S Efficin Naproxen Ursinus  Auranofin Capsules Elmiron Neocylate Vanquish  Axotal Emagrin Norgesic Verin  Azathioprine Empirin or Empirin with Codeine Normiflo Vitamin E  Azolid Emprazil Nuprin Voltaren  Bayer Aspirin plain, buffered or children's or timed BC Tablets or powders Encaprin Orgaran Warfarin Sodium  Buff-a-Comp Enoxaparin Orudis Zorpin  Buff-a-Comp with Codeine Equegesic Os-Cal-Gesic   Buffaprin Excedrin plain, buffered or Extra Strength Oxalid   Bufferin Arthritis Strength Feldene Oxphenbutazone   Bufferin plain or Extra Strength Feldene Capsules Oxycodone with Aspirin   Bufferin with Codeine Fenoprofen Fenoprofen Pabalate or Pabalate-SF   Buffets II Flogesic Panagesic   Buffinol plain or Extra Strength Florinal or Florinal with Codeine Panwarfarin   Buf-Tabs Flurbiprofen Penicillamine   Butalbital Compound Four-way cold tablets Penicillin   Butazolidin Fragmin Pepto-Bismol   Carbenicillin Geminisyn Percodan   Carna Arthritis Reliever Geopen Persantine   Carprofen Gold's salt Persistin   Chloramphenicol Goody's Phenylbutazone   Chloromycetin Haltrain Piroxlcam   Clmetidine heparin Plaquenil    Cllnoril Hyco-pap Ponstel   Clofibrate Hydroxy chloroquine Propoxyphen         Before stopping any of these medications, be sure to consult the physician who ordered them.  Some, such as Coumadin (Warfarin) are ordered to prevent or treat serious conditions such as "deep thrombosis", "pumonary embolisms", and other heart problems.  The amount of time that you may need off of the medication may also vary with the medication and the reason for which you were taking it.  If you are taking any of these medications, please make sure you notify your pain physician before you undergo any procedures.

## 2016-05-11 ENCOUNTER — Telehealth: Payer: Self-pay | Admitting: Unknown Physician Specialty

## 2016-05-11 MED ORDER — POLYMYXIN B-TRIMETHOPRIM 10000-0.1 UNIT/ML-% OP SOLN: 1.0000 [drp] | OPHTHALMIC | 0 refills | Status: DC

## 2016-05-11 NOTE — Telephone Encounter (Signed)
Heather called and would like to know if there is something that could be called in to [facility] for the pts eye. They are worried about her possibly having pink eye. She is scheduled first thing in the morning but would like something called in for discomfort in the mean time.

## 2016-05-11 NOTE — Telephone Encounter (Signed)
Routing to provider  

## 2016-05-11 NOTE — Telephone Encounter (Signed)
Done

## 2016-05-11 NOTE — Telephone Encounter (Signed)
Called and let patient know rx was sent to pharmacy.

## 2016-05-12 ENCOUNTER — Ambulatory Visit (INDEPENDENT_AMBULATORY_CARE_PROVIDER_SITE_OTHER): Payer: Medicare Other | Admitting: Family Medicine

## 2016-05-12 ENCOUNTER — Encounter: Payer: Self-pay | Admitting: Family Medicine

## 2016-05-12 VITALS — BP 118/77 | HR 63 | Temp 97.7°F | Wt 204.0 lb

## 2016-05-12 DIAGNOSIS — H04123 Dry eye syndrome of bilateral lacrimal glands: Secondary | ICD-10-CM | POA: Diagnosis not present

## 2016-05-12 DIAGNOSIS — H5789 Other specified disorders of eye and adnexa: Secondary | ICD-10-CM

## 2016-05-12 DIAGNOSIS — H578 Other specified disorders of eye and adnexa: Secondary | ICD-10-CM | POA: Diagnosis not present

## 2016-05-12 NOTE — Progress Notes (Signed)
   BP 118/77   Pulse 63   Temp 97.7 F (36.5 C)   Wt 204 lb (92.5 kg)   SpO2 96%   BMI 37.31 kg/m    Subjective:    Patient ID: Susan Houston, female    DOB: [birth year], 55 y.o.   MRN: [id number]  HPI: Susan Houston is a 55 y.o. female  Chief Complaint  Patient presents with  . Eye Pain    left eye, started Saturday. Painful. draining. Does not itch.   Patient presents with left eye pain, redness, and drainage x 3 days. Worsening since onset. States it gets matted shut in the night. Associated symptoms of photophobia, headache, nausea, blurred vision, and intermittently felt feverish. Can't wear her glasses right now as it makes pain worse. Never had anything like this happen. No sick contacts. Last visit with opthalmologist was 1 year ago. Taking excedrin migraine daily with good relief of the headache. Has also been using compresses with no relief. [name] called polytrim drops yesterday which she states may have helped some.   Relevant past medical, surgical, family and social history reviewed and updated as indicated. Interim medical history since our last visit reviewed. Allergies and medications reviewed and updated.  Review of Systems  Constitutional: Positive for fever.  Eyes: Positive for photophobia, pain, discharge, redness and visual disturbance. Negative for itching.  Respiratory: Negative.   Cardiovascular: Negative.   Neurological: Positive for headaches.  Psychiatric/Behavioral: Negative.     Per HPI unless specifically indicated above     Objective:    BP 118/77   Pulse 63   Temp 97.7 F (36.5 C)   Wt 204 lb (92.5 kg)   SpO2 96%   BMI 37.31 kg/m   Wt Readings from Last 3 Encounters:  05/12/16 204 lb (92.5 kg)  04/29/16 175 lb (79.4 kg)  03/25/16 173 lb (78.5 kg)  Visual acuity 20/200 in L eye   Physical Exam  Constitutional: She is oriented to person, place, and time. She appears well-developed and well-nourished. No distress.  HENT:  Head:  Atraumatic.  Eyes:  Left pupil with sluggish response to light TTP of left eye, feels firmer than right Left eye erythematous Right eye appears normal  Neck: Normal range of motion. Neck supple.  Cardiovascular: Normal rate.   Pulmonary/Chest: Effort normal.  Musculoskeletal: Normal range of motion.  Lymphadenopathy:    She has no cervical adenopathy.  Neurological: She is alert and oriented to person, place, and time.  Skin: Skin is warm and dry.  Psychiatric: She has a normal mood and affect. Her behavior is normal.  Nursing note and vitals reviewed.       Assessment & Plan:   Problem List Items Addressed This Visit    None    Visit Diagnoses    Red eye    -  Primary     Concern for acute angle closure glaucoma of left eye.   Discussed case with Dr. Wynetta Emery, who agrees that she should be seen by Opthalmology right away. [facility] was called and will see her as soon as she arrives. A family member will drive her right over there upon leaving the office.   Follow up plan: Return if symptoms worsen or fail to improve.

## 2016-05-12 NOTE — Patient Instructions (Signed)
Follow up right away with [facility].

## 2016-05-13 ENCOUNTER — Encounter: Payer: Self-pay | Admitting: Pain Medicine

## 2016-05-13 ENCOUNTER — Ambulatory Visit: Payer: Medicare Other | Attending: Pain Medicine | Admitting: Pain Medicine

## 2016-05-13 VITALS — BP 106/58 | HR 60 | Temp 97.3°F | Resp 16 | Ht 62.0 in | Wt 204.0 lb

## 2016-05-13 DIAGNOSIS — M5416 Radiculopathy, lumbar region: Secondary | ICD-10-CM

## 2016-05-13 DIAGNOSIS — M47816 Spondylosis without myelopathy or radiculopathy, lumbar region: Secondary | ICD-10-CM | POA: Insufficient documentation

## 2016-05-13 DIAGNOSIS — M5415 Radiculopathy, thoracolumbar region: Secondary | ICD-10-CM

## 2016-05-13 DIAGNOSIS — M5136 Other intervertebral disc degeneration, lumbar region: Secondary | ICD-10-CM

## 2016-05-13 DIAGNOSIS — Z96653 Presence of artificial knee joint, bilateral: Secondary | ICD-10-CM

## 2016-05-13 DIAGNOSIS — M545 Low back pain: Secondary | ICD-10-CM | POA: Diagnosis present

## 2016-05-13 DIAGNOSIS — M533 Sacrococcygeal disorders, not elsewhere classified: Secondary | ICD-10-CM

## 2016-05-13 DIAGNOSIS — M5137 Other intervertebral disc degeneration, lumbosacral region: Secondary | ICD-10-CM

## 2016-05-13 DIAGNOSIS — M5126 Other intervertebral disc displacement, lumbar region: Secondary | ICD-10-CM | POA: Insufficient documentation

## 2016-05-13 DIAGNOSIS — M79606 Pain in leg, unspecified: Secondary | ICD-10-CM | POA: Diagnosis present

## 2016-05-13 MED ORDER — BUPIVACAINE HCL (PF) 0.25 % IJ SOLN
30.0000 mL | Freq: Once | INTRAMUSCULAR | Status: AC
  Administered 2016-05-13: 30 mL
  Filled 2016-05-13: qty 30

## 2016-05-13 MED ORDER — CEFAZOLIN SODIUM 1 G IJ SOLR
INTRAMUSCULAR | Status: AC
  Administered 2016-05-13: 08:00:00
  Filled 2016-05-13: qty 10

## 2016-05-13 MED ORDER — TRIAMCINOLONE ACETONIDE 40 MG/ML IJ SUSP
40.0000 mg | Freq: Once | INTRAMUSCULAR | Status: AC
  Administered 2016-05-13: 40 mg
  Filled 2016-05-13: qty 1

## 2016-05-13 MED ORDER — CEFAZOLIN IN D5W 1 GM/50ML IV SOLN
1.0000 g | Freq: Once | INTRAVENOUS | Status: AC
  Administered 2016-05-13: 1 g via INTRAVENOUS

## 2016-05-13 MED ORDER — CEFUROXIME AXETIL 250 MG PO TABS: 250.0000 mg | ORAL_TABLET | Freq: Two times a day (BID) | ORAL | 0 refills | Status: DC

## 2016-05-13 MED ORDER — ORPHENADRINE CITRATE 30 MG/ML IJ SOLN
60.0000 mg | Freq: Once | INTRAMUSCULAR | Status: AC
  Administered 2016-05-13: 60 mg via INTRAMUSCULAR
  Filled 2016-05-13: qty 2

## 2016-05-13 MED ORDER — MIDAZOLAM HCL 5 MG/5ML IJ SOLN
5.0000 mg | Freq: Once | INTRAMUSCULAR | Status: AC
  Administered 2016-05-13: 3 mg via INTRAVENOUS
  Filled 2016-05-13: qty 5

## 2016-05-13 MED ORDER — LIDOCAINE HCL (PF) 1 % IJ SOLN
10.0000 mL | Freq: Once | INTRAMUSCULAR | Status: AC
  Administered 2016-05-13: 10 mL via SUBCUTANEOUS
  Filled 2016-05-13: qty 10

## 2016-05-13 MED ORDER — FENTANYL CITRATE (PF) 100 MCG/2ML IJ SOLN
100.0000 ug | Freq: Once | INTRAMUSCULAR | Status: AC
  Administered 2016-05-13: 100 ug via INTRAVENOUS
  Filled 2016-05-13: qty 2

## 2016-05-13 MED ORDER — LACTATED RINGERS IV SOLN
1000.0000 mL | INTRAVENOUS | Status: DC
  Administered 2016-05-13: 1000 mL via INTRAVENOUS

## 2016-05-13 NOTE — Patient Instructions (Addendum)
PLAN  Continue present medication Nucynta and begin taking antibiotic Ceftin as prescribed. Please obtain your Ceftin antibiotic today and begin taking antibiotic today  F/U PCP Dr. Jeananne Rama for evaliation of  BP and general medical  Condition as discussed  F/U surgical evaluation as discussed  F/U Dr.Su as planned   F/U neurological evaluation. May consider PNCV/EMG studies and other studies pending follow-up evaluations  May consider radiofrequency rhizolysis or intraspinal procedures pending response to present treatment and F/U evaluation.  Patient to call [facility] Management Center should patient have concerns prior to scheduled return appointment.Pain Management Discharge Instructions  General Discharge Instructions :  If you need to reach your doctor call: Monday-Friday 8:00 am - 4:00 pm at [phone] or toll free [phone].  After clinic hours [phone] to have operator reach doctor.  Bring all of your medication bottles to all your appointments in the pain clinic.  To cancel or reschedule your appointment with Pain Management please remember to call 24 hours in advance to avoid a fee.  Refer to the educational materials which you have been given on: General Risks, I had my Procedure. Discharge Instructions, Post Sedation.  Post Procedure Instructions:  The drugs you were given will stay in your system until tomorrow, so for the next 24 hours you should not drive, make any legal decisions or drink any alcoholic beverages.  You may eat anything you prefer, but it is better to start with liquids then soups and crackers, and gradually work up to solid foods.  Please notify your doctor immediately if you have any unusual bleeding, trouble breathing or pain that is not related to your normal pain.  Depending on the type of procedure that was done, some parts of your body may feel week and/or numb.  This usually clears up by tonight or the next day.  Walk with the use of  an assistive device or accompanied by an adult for the 24 hours.  You may use ice on the affected area for the first 24 hours.  Put ice in a Ziploc bag and cover with a towel and place against area 15 minutes on 15 minutes off.  You may switch to heat after 24 hours.GENERAL RISKS AND COMPLICATIONS  What are the risk, side effects and possible complications? Generally speaking, most procedures are safe.  However, with any procedure there are risks, side effects, and the possibility of complications.  The risks and complications are dependent upon the sites that are lesioned, or the type of nerve block to be performed.  The closer the procedure is to the spine, the more serious the risks are.  Great care is taken when placing the radio frequency needles, block needles or lesioning probes, but sometimes complications can occur. 1. Infection: Any time there is an injection through the skin, there is a risk of infection.  This is why sterile conditions are used for these blocks.  There are four possible types of infection. 1. Localized skin infection. 2. Central Nervous System Infection-This can be in the form of Meningitis, which can be deadly. 3. Epidural Infections-This can be in the form of an epidural abscess, which can cause pressure inside of the spine, causing compression of the spinal cord with subsequent paralysis. This would require an emergency surgery to decompress, and there are no guarantees that the patient would recover from the paralysis. 4. Discitis-This is an infection of the intervertebral discs.  It occurs in about 1% of discography procedures.  It is difficult to treat  and it may lead to surgery.        2. Pain: the needles have to go through skin and soft tissues, will cause soreness.       3. Damage to internal structures:  The nerves to be lesioned may be near blood vessels or    other nerves which can be potentially damaged.       4. Bleeding: Bleeding is more common if the  patient is taking blood thinners such as  aspirin, Coumadin, Ticiid, Plavix, etc., or if he/she have some genetic predisposition  such as hemophilia. Bleeding into the spinal canal can cause compression of the spinal  cord with subsequent paralysis.  This would require an emergency surgery to  decompress and there are no guarantees that the patient would recover from the  paralysis.       5. Pneumothorax:  Puncturing of a lung is a possibility, every time a needle is introduced in  the area of the chest or upper back.  Pneumothorax refers to free air around the  collapsed lung(s), inside of the thoracic cavity (chest cavity).  Another two possible  complications related to a similar event would include: Hemothorax and Chylothorax.   These are variations of the Pneumothorax, where instead of air around the collapsed  lung(s), you may have blood or chyle, respectively.       6. Spinal headaches: They may occur with any procedures in the area of the spine.       7. Persistent CSF (Cerebro-Spinal Fluid) leakage: This is a rare problem, but may occur  with prolonged intrathecal or epidural catheters either due to the formation of a fistulous  track or a dural tear.       8. Nerve damage: By working so close to the spinal cord, there is always a possibility of  nerve damage, which could be as serious as a permanent spinal cord injury with  paralysis.       9. Death:  Although rare, severe deadly allergic reactions known as "Anaphylactic  reaction" can occur to any of the medications used.      10. Worsening of the symptoms:  We can always make thing worse.  What are the chances of something like this happening? Chances of any of this occuring are extremely low.  By statistics, you have more of a chance of getting killed in a motor vehicle accident: while driving to the hospital than any of the above occurring .  Nevertheless, you should be aware that they are possibilities.  In general, it is similar to taking a  shower.  Everybody knows that you can slip, hit your head and get killed.  Does that mean that you should not shower again?  Nevertheless always keep in mind that statistics do not mean anything if you happen to be on the wrong side of them.  Even if a procedure has a 1 (one) in a 1,000,000 (million) chance of going wrong, it you happen to be that one..Also, keep in mind that by statistics, you have more of a chance of having something go wrong when taking medications.  Who should not have this procedure? If you are on a blood thinning medication (e.g. Coumadin, Plavix, see list of "Blood Thinners"), or if you have an active infection going on, you should not have the procedure.  If you are taking any blood thinners, please inform your physician.  How should I prepare for this procedure?  Do not eat or drink  anything at least six hours prior to the procedure.  Bring a driver with you .  It cannot be a taxi.  Come accompanied by an adult that can drive you back, and that is strong enough to help you if your legs get weak or numb from the local anesthetic.  Take all of your medicines the morning of the procedure with just enough water to swallow them.  If you have diabetes, make sure that you are scheduled to have your procedure done first thing in the morning, whenever possible.  If you have diabetes, take only half of your insulin dose and notify our nurse that you have done so as soon as you arrive at the clinic.  If you are diabetic, but only take blood sugar pills (oral hypoglycemic), then do not take them on the morning of your procedure.  You may take them after you have had the procedure.  Do not take aspirin or any aspirin-containing medications, at least eleven (11) days prior to the procedure.  They may prolong bleeding.  Wear loose fitting clothing that may be easy to take off and that you would not mind if it got stained with Betadine or blood.  Do not wear any jewelry or  perfume  Remove any nail coloring.  It will interfere with some of our monitoring equipment.  NOTE: Remember that this is not meant to be interpreted as a complete list of all possible complications.  Unforeseen problems may occur.  BLOOD THINNERS The following drugs contain aspirin or other products, which can cause increased bleeding during surgery and should not be taken for 2 weeks prior to and 1 week after surgery.  If you should need take something for relief of minor pain, you may take acetaminophen which is found in Tylenol,m Datril, Anacin-3 and Panadol. It is not blood thinner. The products listed below are.  Do not take any of the products listed below in addition to any listed on your instruction sheet.  A.P.C or A.P.C with Codeine Codeine Phosphate Capsules #3 Ibuprofen Ridaura  ABC compound Congesprin Imuran rimadil  Advil Cope Indocin Robaxisal  Alka-Seltzer Effervescent Pain Reliever and Antacid Coricidin or Coricidin-D  Indomethacin Rufen  Alka-Seltzer plus Cold Medicine Cosprin Ketoprofen S-A-C Tablets  Anacin Analgesic Tablets or Capsules Coumadin Korlgesic Salflex  Anacin Extra Strength Analgesic tablets or capsules CP-2 Tablets Lanoril Salicylate  Anaprox Cuprimine Capsules Levenox Salocol  Anexsia-D Dalteparin Magan Salsalate  Anodynos Darvon compound Magnesium Salicylate Sine-off  Ansaid Dasin Capsules Magsal Sodium Salicylate  Anturane Depen Capsules Marnal Soma  APF Arthritis pain formula Dewitt's Pills Measurin Stanback  Argesic Dia-Gesic Meclofenamic Sulfinpyrazone  Arthritis Bayer Timed Release Aspirin Diclofenac Meclomen Sulindac  Arthritis pain formula Anacin Dicumarol Medipren Supac  Analgesic (Safety coated) Arthralgen Diffunasal Mefanamic Suprofen  Arthritis Strength Bufferin Dihydrocodeine Mepro Compound Suprol  Arthropan liquid Dopirydamole Methcarbomol with Aspirin Synalgos  ASA tablets/Enseals Disalcid Micrainin Tagament  Ascriptin Doan's Midol  Talwin  Ascriptin A/D Dolene Mobidin Tanderil  Ascriptin Extra Strength Dolobid Moblgesic Ticlid  Ascriptin with Codeine Doloprin or Doloprin with Codeine Momentum Tolectin  Asperbuf Duoprin Mono-gesic Trendar  Aspergum Duradyne Motrin or Motrin IB Triminicin  Aspirin plain, buffered or enteric coated Durasal Myochrisine Trigesic  Aspirin Suppositories Easprin Nalfon Trillsate  Aspirin with Codeine Ecotrin Regular or Extra Strength Naprosyn Uracel  Atromid-S Efficin Naproxen Ursinus  Auranofin Capsules Elmiron Neocylate Vanquish  Axotal Emagrin Norgesic Verin  Azathioprine Empirin or Empirin with Codeine Normiflo Vitamin E  Azolid Emprazil Nuprin Voltaren  Bayer Aspirin plain, buffered or children's or timed BC Tablets or powders Encaprin Orgaran Warfarin Sodium  Buff-a-Comp Enoxaparin Orudis Zorpin  Buff-a-Comp with Codeine Equegesic Os-Cal-Gesic   Buffaprin Excedrin plain, buffered or Extra Strength Oxalid   Bufferin Arthritis Strength Feldene Oxphenbutazone   Bufferin plain or Extra Strength Feldene Capsules Oxycodone with Aspirin   Bufferin with Codeine Fenoprofen Fenoprofen Pabalate or Pabalate-SF   Buffets II Flogesic Panagesic   Buffinol plain or Extra Strength Florinal or Florinal with Codeine Panwarfarin   Buf-Tabs Flurbiprofen Penicillamine   Butalbital Compound Four-way cold tablets Penicillin   Butazolidin Fragmin Pepto-Bismol   Carbenicillin Geminisyn Percodan   Carna Arthritis Reliever Geopen Persantine   Carprofen Gold's salt Persistin   Chloramphenicol Goody's Phenylbutazone   Chloromycetin Haltrain Piroxlcam   Clmetidine heparin Plaquenil   Cllnoril Hyco-pap Ponstel   Clofibrate Hydroxy chloroquine Propoxyphen         Before stopping any of these medications, be sure to consult the physician who ordered them.  Some, such as Coumadin (Warfarin) are ordered to prevent or treat serious conditions such as "deep thrombosis", "pumonary embolisms", and other heart  problems.  The amount of time that you may need off of the medication may also vary with the medication and the reason for which you were taking it.  If you are taking any of these medications, please make sure you notify your pain physician before you undergo any procedures.

## 2016-05-13 NOTE — Progress Notes (Signed)
     PROCEDURE PERFORMED: Lumbosacral selective nerve root block   NOTE: The patient is a 55 y.o. female who returns to [facility] for further evaluation and treatment of pain involving the lumbar and lower extremity region. Studies consisting of MRI has revealed the patient to be with evidence of degenerative disc disease lumbar spine with degenerative changes L4-5 disc desiccation, circumferential bulging and focal protrusion in the left neural foramen to extraforaminal region with facet and ligamentous hypertrophy, L5-S1 facet degeneration with no disc abnormalities.. There is concern regarding intraspinal abnormalities contributing to the patient's symptomatology with concern regarding component of patient's pain began due to significant component of lumbar radiculopathy. The risks, benefits, and expectations of the procedure have been explained to the patient who was understanding and in agreement with suggested treatment plan. We will proceed with interventional treatment as discussed and as explained to the patient. The patient is understanding and in agreement with suggested treatment plan.   DESCRIPTION OF PROCEDURE: Lumbosacral selective nerve root block with IV Versed, IV fentanyl conscious sedation, EKG, blood pressure, pulse, capnography, and pulse oximetry monitoring. The procedure was performed with the patient in the prone position under fluoroscopic guidance. With the patient in the prone position, Betadine prep of proposed entry site was performed. Local anesthetic skin wheal of proposed needle entry site was prepared with 1.5% plain lidocaine with AP view of the lumbosacral spine.   PROCEDURE #1: Needle placement at the right L 2 vertebral body: A 22 -gauge needle was inserted at the inferior border of the transverse process of the vertebral body with needle placed medial to the midline of the transverse process on AP view of the lumbosacral spine.   NEEDLE PLACEMENT AT   L3, L4, AND L5  VERTEBRAL BODY LEVELS  Needle  placement was accomplished at L3, L4, and L5  vertebral body levels on the right side exactly as was accomplished at the L2  vertebral body level  and utilizing the same technique and under fluoroscopic guidance.   Needle placement was then verified on lateral view at all levels with needle tip documented to be in the posterior superior quadrant of the intervertebral foramen of  L 2, L3, L4, and L5. Following negative aspiration for heme and CSF at each level, each level was injected with 3 mL of 0.25% bupivacaine with Kenalog.   The patient tolerated the procedure well. A total of 10 mg of Kenalog was utilized for the procedure.   PLAN:  1. Medications: Will continue presently prescribed medication Nucynta 2. The patient is to undergo follow-up evaluation with PCP C Wicker for evaluation of blood pressure and general medical condition status post procedure performed on today's visit. 3. Surgical follow-up evaluation. Has been addressed 4. Neurological evaluation. PNCV EMG studies and other studies have been addressed 5. May consider radiofrequency procedures, implantation type procedures and other treatment pending response to treatment and follow-up evaluation. 6. F/U appointment with Dr. Kasandra Knudsen as planned 7. The patient has been advised do adhere to proper body mechanics and avoid activities which may aggravate condition. 8. The patient has been advised to call the Pain Management Center prior to scheduled return appointment should there be significant change in the patient's condition or should the patient have other concerns regarding condition prior to scheduled return appointment.

## 2016-05-14 ENCOUNTER — Telehealth: Payer: Self-pay | Admitting: *Deleted

## 2016-05-14 NOTE — Telephone Encounter (Signed)
Voicemail left for patient to call our office if there are questions or concerns re; procedure on yesterday.  

## 2016-05-19 ENCOUNTER — Other Ambulatory Visit: Payer: Self-pay | Admitting: Unknown Physician Specialty

## 2016-05-21 ENCOUNTER — Encounter: Payer: Self-pay | Admitting: Pain Medicine

## 2016-05-21 ENCOUNTER — Ambulatory Visit: Payer: Medicare Other | Attending: Pain Medicine | Admitting: Pain Medicine

## 2016-05-21 VITALS — BP 117/73 | HR 51 | Temp 97.8°F | Resp 16 | Ht 63.0 in | Wt 204.0 lb

## 2016-05-21 DIAGNOSIS — M5136 Other intervertebral disc degeneration, lumbar region: Secondary | ICD-10-CM

## 2016-05-21 DIAGNOSIS — M79606 Pain in leg, unspecified: Secondary | ICD-10-CM | POA: Diagnosis present

## 2016-05-21 DIAGNOSIS — M5126 Other intervertebral disc displacement, lumbar region: Secondary | ICD-10-CM | POA: Insufficient documentation

## 2016-05-21 DIAGNOSIS — M5137 Other intervertebral disc degeneration, lumbosacral region: Secondary | ICD-10-CM

## 2016-05-21 DIAGNOSIS — M545 Low back pain: Secondary | ICD-10-CM | POA: Diagnosis present

## 2016-05-21 DIAGNOSIS — M47896 Other spondylosis, lumbar region: Secondary | ICD-10-CM | POA: Diagnosis not present

## 2016-05-21 DIAGNOSIS — M791 Myalgia: Secondary | ICD-10-CM | POA: Diagnosis not present

## 2016-05-21 DIAGNOSIS — M5116 Intervertebral disc disorders with radiculopathy, lumbar region: Secondary | ICD-10-CM | POA: Diagnosis not present

## 2016-05-21 DIAGNOSIS — M533 Sacrococcygeal disorders, not elsewhere classified: Secondary | ICD-10-CM | POA: Diagnosis not present

## 2016-05-21 DIAGNOSIS — M47816 Spondylosis without myelopathy or radiculopathy, lumbar region: Secondary | ICD-10-CM

## 2016-05-21 DIAGNOSIS — M47817 Spondylosis without myelopathy or radiculopathy, lumbosacral region: Secondary | ICD-10-CM | POA: Diagnosis not present

## 2016-05-21 DIAGNOSIS — M5416 Radiculopathy, lumbar region: Secondary | ICD-10-CM | POA: Diagnosis not present

## 2016-05-21 MED ORDER — TAPENTADOL HCL 50 MG PO TABS: ORAL_TABLET | ORAL | 0 refills | Status: DC

## 2016-05-21 NOTE — Patient Instructions (Addendum)
PLAN  Continue present medication Nucynta  Lumbosacral selective nerve root block on the left side to be performed at time return appointment  F/U PCP C Wicker for evaluation of blood pressure and general medical condition  F/U surgical evaluation. May consider pending follow-up evaluation  F/U Dr.Su as planned and as discussed  F/U neurological evaluation. May consider PNCV/EMG studies and other studies pending follow-up evaluations  Radiofrequency rhizolysis lumbar facet medial branch nerves to be performed pending insurance approval as previously discussed  Ask the nurses and secretaries if insurance has approved you for radiofrequency  of the lumbar facets, medial branch nerves  Patient to call [facility] Management Center should patient have concerns prior to scheduled return appointmentSelective Nerve Root Block Patient Information  Description: Specific nerve roots exit the spinal canal and these nerves can be compressed and inflamed by a bulging disc and bone spurs.  By injecting steroids on the nerve root, we can potentially decrease the inflammation surrounding these nerves, which often leads to decreased pain.  Also, by injecting local anesthesia on the nerve root, this can provide [place] helpful information to give to your referring doctor if it decreases your pain.  Selective nerve root blocks can be done along the spine from the neck to the low back depending on the location of your pain.   After numbing the skin with local anesthesia, a small needle is passed to the nerve root and the position of the needle is verified using x-ray pictures.  After the needle is in correct position, we then deposit the medication.  You may experience a pressure sensation while this is being done.  The entire block usually lasts less than 15 minutes.  Conditions that may be treated with selective nerve root blocks:  Low back and leg pain  Spinal stenosis  Diagnostic block prior to potential  surgery  Neck and arm pain  Post laminectomy syndrome  Preparation for the injection:  1. Do not eat any solid food or dairy products within 8 hours of your appointment. 2. You may drink clear liquids up to 3 hours before an appointment.  Clear liquids include water, black coffee, juice or soda.  No milk or cream please. 3. You may take your regular medications, including pain medications, with a sip of water before your appointment.  Diabetics should hold regular insulin (if taken separately) and take 1/2 normal NPH dose the morning of the procedure.  Carry some sugar containing items with you to your appointment. 4. A driver must accompany you and be prepared to drive you home after your procedure. 5. Bring all your current medications with you. 6. An IV may be inserted and sedation may be given at the discretion of the physician. 7. A blood pressure cuff, EKG, and other monitors will often be applied during the procedure.  Some patients may need to have extra oxygen administered for a short period. 8. You will be asked to provide medical information, including allergies, prior to the procedure.  We must know immediately if you are taking blood  Thinners (like Coumadin) or if you are allergic to IV iodine contrast (dye).  Possible side-effects: All are usually temporary  Bleeding from needle site  Light headedness  Numbness and tingling  Decreased blood pressure  Weakness in arms/legs  Pressure sensation in back/neck  Pain at injection site (several days)  Possible complications: All are extremely rare  Infection  Nerve injury  Spinal headache (a headache wore with upright position)  Call if  you experience:  Fever/chills associated with headache or increased back/neck pain  Headache worsened by an upright position  New onset weakness or numbness of an extremity below the injection site  Hives or difficulty breathing (go to the emergency room)  Inflammation or  drainage at the injection site(s)  Severe back/neck pain greater than usual  New symptoms which are concerning to you  Please note:  Although the local anesthetic injected can often make your back or neck feel good for several hours after the injection the pain will likely return.  It takes 3-5 days for steroids to work on the nerve root. You may not notice any pain relief for at least one week.  If effective, we will often do a series of 3 injections spaced 3-6 weeks apart to maximally decrease your pain.    If you have any questions, please call [phone] [facility] Clinic[facility] R[facility] AND COMPLICATIONS  What are the risk, side effects and possible complications? Generally speaking, most procedures are safe.  However, with any procedure there are risks, side effects, and the possibility of complications.  The risks and complications are dependent upon the sites that are lesioned, or the type of nerve block to be performed.  The closer the procedure is to the spine, the more serious the risks are.  Great care is taken when placing the radio frequency needles, block needles or lesioning probes, but sometimes complications can occur. 1. Infection: Any time there is an injection through the skin, there is a risk of infection.  This is why sterile conditions are used for these blocks.  There are four possible types of infection. 1. Localized skin infection. 2. Central Nervous System Infection-This can be in the form of Meningitis, which can be deadly. 3. Epidural Infections-This can be in the form of an epidural abscess, which can cause pressure inside of the spine, causing compression of the spinal cord with subsequent paralysis. This would require an emergency surgery to decompress, and there are no guarantees that the patient would recover from the paralysis. 4. Discitis-This is an infection of the intervertebral discs.  It occurs in about 1% of discography  procedures.  It is difficult to treat and it may lead to surgery.        2. Pain: the needles have to go through skin and soft tissues, will cause soreness.       3. Damage to internal structures:  The nerves to be lesioned may be near blood vessels or    other nerves which can be potentially damaged.       4. Bleeding: Bleeding is more common if the patient is taking blood thinners such as  aspirin, Coumadin, Ticiid, Plavix, etc., or if he/she have some genetic predisposition  such as hemophilia. Bleeding into the spinal canal can cause compression of the spinal  cord with subsequent paralysis.  This would require an emergency surgery to  decompress and there are no guarantees that the patient would recover from the  paralysis.       5. Pneumothorax:  Puncturing of a lung is a possibility, every time a needle is introduced in  the area of the chest or upper back.  Pneumothorax refers to free air around the  collapsed lung(s), inside of the thoracic cavity (chest cavity).  Another two possible  complications related to a similar event would include: Hemothorax and Chylothorax.   These are variations of the Pneumothorax, where instead of air around the collapsed  lung(s), you may have blood or chyle, respectively.       6. Spinal headaches: They may occur with any procedures in the area of the spine.       7. Persistent CSF (Cerebro-Spinal Fluid) leakage: This is a rare problem, but may occur  with prolonged intrathecal or epidural catheters either due to the formation of a fistulous  track or a dural tear.       8. Nerve damage: By working so close to the spinal cord, there is always a possibility of  nerve damage, which could be as serious as a permanent spinal cord injury with  paralysis.       9. Death:  Although rare, severe deadly allergic reactions known as "Anaphylactic  reaction" can occur to any of the medications used.      10. Worsening of the symptoms:  We can always make thing worse.  What  are the chances of something like this happening? Chances of any of this occuring are extremely low.  By statistics, you have more of a chance of getting killed in a motor vehicle accident: while driving to the hospital than any of the above occurring .  Nevertheless, you should be aware that they are possibilities.  In general, it is similar to taking a shower.  Everybody knows that you can slip, hit your head and get killed.  Does that mean that you should not shower again?  Nevertheless always keep in mind that statistics do not mean anything if you happen to be on the wrong side of them.  Even if a procedure has a 1 (one) in a 1,000,000 (million) chance of going wrong, it you happen to be that one..Also, keep in mind that by statistics, you have more of a chance of having something go wrong when taking medications.  Who should not have this procedure? If you are on a blood thinning medication (e.g. Coumadin, Plavix, see list of "Blood Thinners"), or if you have an active infection going on, you should not have the procedure.  If you are taking any blood thinners, please inform your physician.  How should I prepare for this procedure?  Do not eat or drink anything at least six hours prior to the procedure.  Bring a driver with you .  It cannot be a taxi.  Come accompanied by an adult that can drive you back, and that is strong enough to help you if your legs get weak or numb from the local anesthetic.  Take all of your medicines the morning of the procedure with just enough water to swallow them.  If you have diabetes, make sure that you are scheduled to have your procedure done first thing in the morning, whenever possible.  If you have diabetes, take only half of your insulin dose and notify our nurse that you have done so as soon as you arrive at the clinic.  If you are diabetic, but only take blood sugar pills (oral hypoglycemic), then do not take them on the morning of your procedure.   You may take them after you have had the procedure.  Do not take aspirin or any aspirin-containing medications, at least eleven (11) days prior to the procedure.  They may prolong bleeding.  Wear loose fitting clothing that may be easy to take off and that you would not mind if it got stained with Betadine or blood.  Do not wear any jewelry or perfume  Remove any nail coloring.  It  will interfere with some of our monitoring equipment.  NOTE: Remember that this is not meant to be interpreted as a complete list of all possible complications.  Unforeseen problems may occur.  BLOOD THINNERS The following drugs contain aspirin or other products, which can cause increased bleeding during surgery and should not be taken for 2 weeks prior to and 1 week after surgery.  If you should need take something for relief of minor pain, you may take acetaminophen which is found in Tylenol,m Datril, Anacin-3 and Panadol. It is not blood thinner. The products listed below are.  Do not take any of the products listed below in addition to any listed on your instruction sheet.  A.P.C or A.P.C with Codeine Codeine Phosphate Capsules #3 Ibuprofen Ridaura  ABC compound Congesprin Imuran rimadil  Advil Cope Indocin Robaxisal  Alka-Seltzer Effervescent Pain Reliever and Antacid Coricidin or Coricidin-D  Indomethacin Rufen  Alka-Seltzer plus Cold Medicine Cosprin Ketoprofen S-A-C Tablets  Anacin Analgesic Tablets or Capsules Coumadin Korlgesic Salflex  Anacin Extra Strength Analgesic tablets or capsules CP-2 Tablets Lanoril Salicylate  Anaprox Cuprimine Capsules Levenox Salocol  Anexsia-D Dalteparin Magan Salsalate  Anodynos Darvon compound Magnesium Salicylate Sine-off  Ansaid Dasin Capsules Magsal Sodium Salicylate  Anturane Depen Capsules Marnal Soma  APF Arthritis pain formula Dewitt's Pills Measurin Stanback  Argesic Dia-Gesic Meclofenamic Sulfinpyrazone  Arthritis Bayer Timed Release Aspirin Diclofenac  Meclomen Sulindac  Arthritis pain formula Anacin Dicumarol Medipren Supac  Analgesic (Safety coated) Arthralgen Diffunasal Mefanamic Suprofen  Arthritis Strength Bufferin Dihydrocodeine Mepro Compound Suprol  Arthropan liquid Dopirydamole Methcarbomol with Aspirin Synalgos  ASA tablets/Enseals Disalcid Micrainin Tagament  Ascriptin Doan's Midol Talwin  Ascriptin A/D Dolene Mobidin Tanderil  Ascriptin Extra Strength Dolobid Moblgesic Ticlid  Ascriptin with Codeine Doloprin or Doloprin with Codeine Momentum Tolectin  Asperbuf Duoprin Mono-gesic Trendar  Aspergum Duradyne Motrin or Motrin IB Triminicin  Aspirin plain, buffered or enteric coated Durasal Myochrisine Trigesic  Aspirin Suppositories Easprin Nalfon Trillsate  Aspirin with Codeine Ecotrin Regular or Extra Strength Naprosyn Uracel  Atromid-S Efficin Naproxen Ursinus  Auranofin Capsules Elmiron Neocylate Vanquish  Axotal Emagrin Norgesic Verin  Azathioprine Empirin or Empirin with Codeine Normiflo Vitamin E  Azolid Emprazil Nuprin Voltaren  Bayer Aspirin plain, buffered or children's or timed BC Tablets or powders Encaprin Orgaran Warfarin Sodium  Buff-a-Comp Enoxaparin Orudis Zorpin  Buff-a-Comp with Codeine Equegesic Os-Cal-Gesic   Buffaprin Excedrin plain, buffered or Extra Strength Oxalid   Bufferin Arthritis Strength Feldene Oxphenbutazone   Bufferin plain or Extra Strength Feldene Capsules Oxycodone with Aspirin   Bufferin with Codeine Fenoprofen Fenoprofen Pabalate or Pabalate-SF   Buffets II Flogesic Panagesic   Buffinol plain or Extra Strength Florinal or Florinal with Codeine Panwarfarin   Buf-Tabs Flurbiprofen Penicillamine   Butalbital Compound Four-way cold tablets Penicillin   Butazolidin Fragmin Pepto-Bismol   Carbenicillin Geminisyn Percodan   Carna Arthritis Reliever Geopen Persantine   Carprofen Gold's salt Persistin   Chloramphenicol Goody's Phenylbutazone   Chloromycetin Haltrain Piroxlcam   Clmetidine  heparin Plaquenil   Cllnoril Hyco-pap Ponstel   Clofibrate Hydroxy chloroquine Propoxyphen         Before stopping any of these medications, be sure to consult the physician who ordered them.  Some, such as Coumadin (Warfarin) are ordered to prevent or treat serious conditions such as "deep thrombosis", "pumonary embolisms", and other heart problems.  The amount of time that you may need off of the medication may also vary with the medication and the reason for which you were  taking it.  If you are taking any of these medications, please make sure you notify your pain physician before you undergo any procedures.

## 2016-05-21 NOTE — Progress Notes (Signed)
     The patient is a 55 year old female who returns to pain management for further evaluation and treatment of pain involving the lumbar and lower extremity region. The patient stated that she had significant improvement of her pain following lumbosacral selective nerve root block performed on the right side and that she wishes to undergo lumbosacral selective nerve root block on the left side to decrease to severe pain of the left lower back and lower extremity region. The patient denies any trauma change in events of daily living the call significant change in symptomatology. The patient stated the pain is aggravated by standing walking and that pain becomes more intense as the day progresses. We will proceed with lumbosacral selective nerve root block on the left side at time of return appointment and we will continue present medications Nucynta as prescribed. All agreed to suggested treatment plan    Physical examination  There was tenderness of the splenius capitis and occipitalis musculature regions of mild to moderate degree. Palpation of the cervical facet cervical paraspinal musculature region and thoracic facet thoracic paraspinal musculature region reproduced moderate discomfort. Palpation over the cervical facet cervical paraspinal musculature region and thoracic facet thoracic paraspinal musculature region reproduced pain of mild-to-moderate degree. The patient appeared to be with slightly decreased grip strength without definite increase of pain with Tinel and Phalen's maneuver. There was tenderness over the paraspinal musculature region of the thoracic region with no crepitus of the thoracic region noted. Palpation over the lumbar paraspinal musculatures and lumbar facet region was with moderate discomfort left greater than right. Lateral bending rotation extension and palpation over the lumbar facets reproduce moderate discomfort. Straight leg raising was limited to approximately 20  without a definite increase of pain with dorsiflexion noted. EHL strength appeared to be decreased. No definite sensory deficit or dermatomal dystrophy detected. Palpation of the PSIS and PII S region reproduced moderate discomfort. There was mild to moderate tenderness of the greater trochanteric region and iliotibial band region. EHL strength appeared to be decreased. There was negative clonus negative Homans. Abdomen was nontender with no costovertebral tenderness noted     Assessment  Degenerative disc disease lumbar spine Degenerative changes L4-5 disc desiccation, circumferential bulging and focal protrusion in the left neural foramen to extraforaminal region with facet and ligamentous hypertrophy, L5-S1 facet degeneration with no disc abnormalities.  Lumbar radiculopathy  Lumbar facet syndrome  Sacroiliac joint dysfunction    PLAN  Continue present medication Nucynta  Lumbosacral selective nerve root block on the left side to be performed at time return appointment  F/U PCP C Wicker for evaluation of blood pressure and general medical condition  F/U surgical evaluation. May consider pending follow-up evaluation  F/U Dr.Su as planned and as discussed  F/U neurological evaluation. May consider PNCV/EMG studies and other studies pending follow-up evaluations  Radiofrequency rhizolysis lumbar facet medial branch nerves to be performed pending insurance approval as previously discussed  Ask the nurses and secretaries if insurance has approved you for radiofrequency  of the lumbar facets, medial branch nerves  Patient to call Pain Management Center should patient have concerns prior to scheduled return appointment

## 2016-05-27 ENCOUNTER — Ambulatory Visit: Payer: Self-pay | Admitting: Pain Medicine

## 2016-06-03 ENCOUNTER — Ambulatory Visit: Payer: Medicare Other | Attending: Pain Medicine | Admitting: Pain Medicine

## 2016-06-03 ENCOUNTER — Encounter: Payer: Self-pay | Admitting: Pain Medicine

## 2016-06-03 VITALS — BP 106/52 | HR 61 | Temp 97.5°F | Resp 14 | Ht 62.0 in | Wt 204.0 lb

## 2016-06-03 DIAGNOSIS — M5136 Other intervertebral disc degeneration, lumbar region: Secondary | ICD-10-CM | POA: Insufficient documentation

## 2016-06-03 DIAGNOSIS — M79606 Pain in leg, unspecified: Secondary | ICD-10-CM | POA: Diagnosis present

## 2016-06-03 DIAGNOSIS — F322 Major depressive disorder, single episode, severe without psychotic features: Secondary | ICD-10-CM

## 2016-06-03 DIAGNOSIS — M47816 Spondylosis without myelopathy or radiculopathy, lumbar region: Secondary | ICD-10-CM

## 2016-06-03 DIAGNOSIS — M5126 Other intervertebral disc displacement, lumbar region: Secondary | ICD-10-CM | POA: Diagnosis not present

## 2016-06-03 DIAGNOSIS — M533 Sacrococcygeal disorders, not elsewhere classified: Secondary | ICD-10-CM

## 2016-06-03 DIAGNOSIS — M5415 Radiculopathy, thoracolumbar region: Secondary | ICD-10-CM

## 2016-06-03 DIAGNOSIS — M5416 Radiculopathy, lumbar region: Secondary | ICD-10-CM | POA: Diagnosis not present

## 2016-06-03 DIAGNOSIS — M5137 Other intervertebral disc degeneration, lumbosacral region: Secondary | ICD-10-CM

## 2016-06-03 DIAGNOSIS — M545 Low back pain: Secondary | ICD-10-CM | POA: Diagnosis present

## 2016-06-03 MED ORDER — CEFAZOLIN IN D5W 1 GM/50ML IV SOLN: 1.0000 g | Freq: Once | INTRAVENOUS | Status: DC

## 2016-06-03 MED ORDER — FENTANYL CITRATE (PF) 100 MCG/2ML IJ SOLN
100.0000 ug | Freq: Once | INTRAMUSCULAR | Status: AC
  Administered 2016-06-03: 100 ug via INTRAVENOUS
  Filled 2016-06-03: qty 2

## 2016-06-03 MED ORDER — LACTATED RINGERS IV SOLN: 1000.0000 mL | INTRAVENOUS | Status: DC

## 2016-06-03 MED ORDER — MIDAZOLAM HCL 5 MG/5ML IJ SOLN
5.0000 mg | Freq: Once | INTRAMUSCULAR | Status: AC
  Administered 2016-06-03: 5 mg via INTRAVENOUS
  Filled 2016-06-03: qty 5

## 2016-06-03 MED ORDER — LIDOCAINE HCL (PF) 1 % IJ SOLN
10.0000 mL | Freq: Once | INTRAMUSCULAR | Status: AC
  Administered 2016-06-03: 10 mL via SUBCUTANEOUS

## 2016-06-03 MED ORDER — BUPIVACAINE HCL (PF) 0.25 % IJ SOLN
30.0000 mL | Freq: Once | INTRAMUSCULAR | Status: AC
  Administered 2016-06-03: 30 mL
  Filled 2016-06-03: qty 30

## 2016-06-03 MED ORDER — TRIAMCINOLONE ACETONIDE 40 MG/ML IJ SUSP
40.0000 mg | Freq: Once | INTRAMUSCULAR | Status: DC
  Filled 2016-06-03: qty 1

## 2016-06-03 MED ORDER — CEFUROXIME AXETIL 250 MG PO TABS: 250.0000 mg | ORAL_TABLET | Freq: Two times a day (BID) | ORAL | 0 refills | Status: DC

## 2016-06-03 MED ORDER — ORPHENADRINE CITRATE 30 MG/ML IJ SOLN
60.0000 mg | Freq: Once | INTRAMUSCULAR | Status: DC
Start: 2016-06-03 — End: 2016-11-20
  Filled 2016-06-03: qty 2

## 2016-06-03 MED ORDER — CEFAZOLIN SODIUM 1 G IJ SOLR
INTRAMUSCULAR | Status: AC
  Administered 2016-06-03: 1 g via INTRAVENOUS
  Filled 2016-06-03: qty 10

## 2016-06-03 NOTE — Patient Instructions (Addendum)
PLAN  Continue present medication  Nucynta and begin taking antibiotic Ceftin as prescribed. Please obtain your Ceftin antibiotic today and begin taking antibiotic today  F/U PCP Dr. Jeananne Rama for evaliation of  BP and general medical  condition. .  F/U surgical evaluation as discussed  F/U Dr.Su as planned   F/U neurological evaluation. May consider PNCV/EMG studies and other studies pending follow-up evaluations  May consider radiofrequency rhizolysis or intraspinal procedures pending response to present treatment and F/U evaluation.  Patient to call Pain Management Center should patient have concerns prior to scheduled return appointment.  Selective Nerve Root Block Patient Information  Description: Specific nerve roots exit the spinal canal and these nerves can be compressed and inflamed by a bulging disc and bone spurs.  By injecting steroids on the nerve root, we can potentially decrease the inflammation surrounding these nerves, which often leads to decreased pain.  Also, by injecting local anesthesia on the nerve root, this can provide [place] helpful information to give to your referring doctor if it decreases your pain.  Selective nerve root blocks can be done along the spine from the neck to the low back depending on the location of your pain.   After numbing the skin with local anesthesia, a small needle is passed to the nerve root and the position of the needle is verified using x-ray pictures.  After the needle is in correct position, we then deposit the medication.  You may experience a pressure sensation while this is being done.  The entire block usually lasts less than 15 minutes.  Conditions that may be treated with selective nerve root blocks:  Low back and leg pain  Spinal stenosis  Diagnostic block prior to potential surgery  Neck and arm pain  Post laminectomy syndrome  Preparation for the injection:  1. Do not eat any solid food or dairy products within 8 hours  of your appointment. 2. You may drink clear liquids up to 3 hours before an appointment.  Clear liquids include water, black coffee, juice or soda.  No milk or cream please. 3. You may take your regular medications, including pain medications, with a sip of water before your appointment.  Diabetics should hold regular insulin (if taken separately) and take 1/2 normal NPH dose the morning of the procedure.  Carry some sugar containing items with you to your appointment. 4. A driver must accompany you and be prepared to drive you home after your procedure. 5. Bring all your current medications with you. 6. An IV may be inserted and sedation may be given at the discretion of the physician. 7. A blood pressure cuff, EKG, and other monitors will often be applied during the procedure.  Some patients may need to have extra oxygen administered for a short period. 8. You will be asked to provide medical information, including allergies, prior to the procedure.  We must know immediately if you are taking blood  Thinners (like Coumadin) or if you are allergic to IV iodine contrast (dye).  Possible side-effects: All are usually temporary  Bleeding from needle site  Light headedness  Numbness and tingling  Decreased blood pressure  Weakness in arms/legs  Pressure sensation in back/neck  Pain at injection site (several days)  Possible complications: All are extremely rare  Infection  Nerve injury  Spinal headache (a headache wore with upright position)  Call if you experience:  Fever/chills associated with headache or increased back/neck pain  Headache worsened by an upright position  New onset  weakness or numbness of an extremity below the injection site  Hives or difficulty breathing (go to the emergency room)  Inflammation or drainage at the injection site(s)  Severe back/neck pain greater than usual  New symptoms which are concerning to you  Please note:  Although the  local anesthetic injected can often make your back or neck feel good for several hours after the injection the pain will likely return.  It takes 3-5 days for steroids to work on the nerve root. You may not notice any pain relief for at least one week.  If effective, we will often do a series of 3 injections spaced 3-6 weeks apart to maximally decrease your pain.    If you have any questions, please call [phone] [facility]  A prescription for CEFTIN was sent to your pharmacy and should be available for pickup today.  Pain Management Discharge Instructions  General Discharge Instructions :  If you need to reach your doctor call: Monday-Friday 8:00 am - 4:00 pm at [phone] or toll free [phone].  After clinic hours [phone] to have operator reach doctor.  Bring all of your medication bottles to all your appointments in the pain clinic.  To cancel or reschedule your appointment with Pain Management please remember to call 24 hours in advance to avoid a fee.  Refer to the educational materials which you have been given on: General Risks, I had my Procedure. Discharge Instructions, Post Sedation.  Post Procedure Instructions:  The drugs you were given will stay in your system until tomorrow, so for the next 24 hours you should not drive, make any legal decisions or drink any alcoholic beverages.  You may eat anything you prefer, but it is better to start with liquids then soups and crackers, and gradually work up to solid foods.  Please notify your doctor immediately if you have any unusual bleeding, trouble breathing or pain that is not related to your normal pain.  Depending on the type of procedure that was done, some parts of your body may feel week and/or numb.  This usually clears up by tonight or the next day.  Walk with the use of an assistive device or accompanied by an adult for the 24 hours.  You may use ice on the affected  area for the first 24 hours.  Put ice in a Ziploc bag and cover with a towel and place against area 15 minutes on 15 minutes off.  You may switch to heat after 24 hours.

## 2016-06-03 NOTE — Progress Notes (Signed)
     PROCEDURE PERFORMED: Lumbosacral selective nerve root block   NOTE: The patient is a 55 y.o. female who returns to [facility] for further evaluation and treatment of pain involving the lumbar and lower extremity region. Studies consisting of MRI has revealed the patient to be with evidence of Degenerative disc disease lumbar spine Degenerative changes L4-5 disc desiccation, circumferential bulging and focal protrusion in the left neural foramen to extraforaminal region with facet and ligamentous hypertrophy, L5-S1 facet degeneration with no disc abnormalities.. There is concern regarding intraspinal abnormalities contributing to the patient's symptomatology.. There is concern regarding component of patient's pain began due to lumbar radiculopathy. The risks, benefits, and expectations of the procedure have been explained to the patient who was understanding and in agreement with suggested treatment plan. We will proceed with interventional treatment as discussed and as explained to the patient. The patient is understanding and in agreement with suggested treatment plan.   DESCRIPTION OF PROCEDURE: Lumbosacral selective nerve root block with IV Versed, IV fentanyl conscious sedation, EKG, blood pressure, pulse, capnography, and pulse oximetry monitoring. The procedure was performed with the patient in the prone position under fluoroscopic guidance. With the patient in the prone position, Betadine prep of proposed entry site was performed. Local anesthetic skin wheal of proposed needle entry site was prepared with 1.5% plain lidocaine with AP view of the lumbosacral spine.   PROCEDURE #1: Needle placement at the left L 2 vertebral body: A 22 -gauge needle was inserted at the inferior border of the transverse process of the vertebral body with needle placed medial to the midline of the transverse process on AP view of the lumbosacral spine.   NEEDLE PLACEMENT AT  L3, L4, and L5  VERTEBRAL  BODY LEVELS  Needle  placement was accomplished at L3, L4, and L5  vertebral body levels on the left side exactly as was accomplished at the L2  vertebral body level  and utilizing the same technique and under fluoroscopic guidance.   Needle placement was then verified on lateral view at all levels with needle tip documented to be in the posterior superior quadrant of the intervertebral foramen of  L 2, L3, L4, and L5 Following negative aspiration for heme and CSF at each level, each level was injected with 3 mL of 0.25% bupivacaine with Kenalog.   The patient tolerated the procedure well. A total of 10 mg of Kenalog was utilized for the procedure.   PLAN:  1. Medications: Will continue presently prescribed medication Nucynta 2. The patient is to undergo follow-up evaluation with PCP C Wicker for evaluation of blood pressure and general medical condition status post procedure performed on today's visit. 3. Surgical follow-up evaluation as discussed 4. Neurological evaluation. May consider PNCV EMG studies and other studies 5. May consider radiofrequency procedures, implantation type procedures and other treatment pending response to treatment and follow-up evaluation. 6. The patient has been advised do adhere to proper body mechanics and avoid activities which may aggravate condition. 7. The patient has been advised to call the Pain Management Center prior to scheduled return appointment should there be significant change in the patient's condition or should the patient have other concerns regarding condition prior to scheduled return appointment.

## 2016-06-04 NOTE — Telephone Encounter (Signed)
Left message to call if needed for any problems or complications after procedure

## 2016-06-11 ENCOUNTER — Encounter (INDEPENDENT_AMBULATORY_CARE_PROVIDER_SITE_OTHER): Payer: Self-pay

## 2016-06-14 ENCOUNTER — Other Ambulatory Visit: Payer: Self-pay | Admitting: Family Medicine

## 2016-06-15 NOTE — Telephone Encounter (Signed)
Your patient 

## 2016-06-23 ENCOUNTER — Ambulatory Visit: Payer: Self-pay | Admitting: Pain Medicine

## 2016-06-24 ENCOUNTER — Encounter: Payer: Self-pay | Admitting: Pain Medicine

## 2016-06-24 ENCOUNTER — Other Ambulatory Visit: Payer: Self-pay | Admitting: *Deleted

## 2016-06-24 ENCOUNTER — Ambulatory Visit: Payer: Medicare Other | Attending: Pain Medicine | Admitting: Pain Medicine

## 2016-06-24 VITALS — BP 111/60 | HR 55 | Temp 97.8°F | Resp 15 | Ht 62.0 in | Wt 197.0 lb

## 2016-06-24 DIAGNOSIS — M542 Cervicalgia: Secondary | ICD-10-CM | POA: Diagnosis present

## 2016-06-24 DIAGNOSIS — M5137 Other intervertebral disc degeneration, lumbosacral region: Secondary | ICD-10-CM

## 2016-06-24 DIAGNOSIS — M47816 Spondylosis without myelopathy or radiculopathy, lumbar region: Secondary | ICD-10-CM

## 2016-06-24 DIAGNOSIS — M791 Myalgia: Secondary | ICD-10-CM | POA: Diagnosis not present

## 2016-06-24 DIAGNOSIS — M5136 Other intervertebral disc degeneration, lumbar region: Secondary | ICD-10-CM | POA: Diagnosis not present

## 2016-06-24 DIAGNOSIS — M533 Sacrococcygeal disorders, not elsewhere classified: Secondary | ICD-10-CM | POA: Insufficient documentation

## 2016-06-24 DIAGNOSIS — M5116 Intervertebral disc disorders with radiculopathy, lumbar region: Secondary | ICD-10-CM | POA: Insufficient documentation

## 2016-06-24 DIAGNOSIS — M47896 Other spondylosis, lumbar region: Secondary | ICD-10-CM | POA: Diagnosis not present

## 2016-06-24 DIAGNOSIS — M546 Pain in thoracic spine: Secondary | ICD-10-CM | POA: Diagnosis present

## 2016-06-24 DIAGNOSIS — M5126 Other intervertebral disc displacement, lumbar region: Secondary | ICD-10-CM | POA: Insufficient documentation

## 2016-06-24 DIAGNOSIS — M5416 Radiculopathy, lumbar region: Secondary | ICD-10-CM | POA: Diagnosis not present

## 2016-06-24 DIAGNOSIS — M47817 Spondylosis without myelopathy or radiculopathy, lumbosacral region: Secondary | ICD-10-CM | POA: Diagnosis not present

## 2016-06-24 DIAGNOSIS — M545 Low back pain: Secondary | ICD-10-CM | POA: Diagnosis not present

## 2016-06-24 DIAGNOSIS — G894 Chronic pain syndrome: Secondary | ICD-10-CM

## 2016-06-24 MED ORDER — TAPENTADOL HCL 50 MG PO TABS: ORAL_TABLET | ORAL | 0 refills | Status: DC

## 2016-06-24 NOTE — Progress Notes (Signed)
Safety precautions to be maintained throughout the outpatient stay will include: orient to surroundings, keep bed in low position, maintain call bell within reach at all times, provide assistance with transfer out of bed and ambulation.  

## 2016-06-24 NOTE — Progress Notes (Signed)
     The patient is a 55 year old female who returns to pain management for further evaluation and treatment of pain involving the neck entire back upper extremity region with predominant pain of the lower back lower extremity region. The patient has had significant improvement of lower back lower extremity pain and is with some return of pain of significant degree interfere with ability to perform activities of daily living as well as obtaining restful sleep. We will continue present medications as prescribed at this time and will patient will call pain management should she have severity disabling pain prior to scheduled return appointment. We will continue Nucynta at this time as prescribed and patient will undergo follow-up evaluation with Dr. Kasandra Knudsen and we will consider additional modifications of treatment regimen pending response to treatment and follow-up evaluation. All agreed to suggested treatment plan.      Physical examination   There was tends to palpation of cervical paraspinal muscular treat and cervical facet region a moderate degree there was moderate tenderness of the trapezius levator scapula rhomboid musculature region. There appeared to be unremarkable Spurling's maneuver. The patient was able to perform drop test without moderate difficulty. Tinel and Phalen's maneuver reproducing minimal discomfort and there was no grip strength discrepancy noted. Palpation over the lumbar region was of increased pain of moderate degree with lateral bending rotation extension and palpation of the lumbar facets reproducing moderate discomfort with mild to moderate tenderness of the greater trochanteric region iliotibial band region. There was tenderness over the PSIS and PII S region a moderate degree with no sensory deficit or dermatomal dystrophy detected. Straight leg raising was tolerated to 30 without increased pain with dorsiflexion noted. There was negative clonus negative Homans. Abdomen  nontender with no costovertebral tenderness noted.        Assessment      Degenerative disc disease lumbar spine Degenerative changes L4-5 disc desiccation, circumferential bulging and focal protrusion in the left neural foramen to extraforaminal region with facet and ligamentous hypertrophy, L5-S1 facet degeneration with no disc abnormalities.  Lumbar radiculopathy  Lumbar facet syndrome  Sacroiliac joint dysfunction     PLAN   Continue present medication Nucynta  F/U PCP C Wicker for evaluation of blood pressure and general medical condition  F/U surgical evaluation. May consider pending follow-up evaluation  F/U Dr.Su as planned and as discussed  F/U neurological evaluation. May consider PNCV/EMG studies and other studies pending follow-up evaluations  Radiofrequency rhizolysis lumbar facet medial branch nerves to be performed pending insurance approval as previously discussed  Ask the nurses and secretaries if insurance has approved you for radiofrequency  of the lumbar facets, medial branch nerves  Patient to call Pain Management Center should patient have concerns prior to scheduled return appointment

## 2016-06-24 NOTE — Patient Instructions (Signed)
PLAN  Continue present medication Nucynta  F/U PCP C Wicker for evaluation of blood pressure and general medical condition  F/U surgical evaluation. May consider pending follow-up evaluation  F/U Dr.Su as planned and as discussed  F/U neurological evaluation. May consider PNCV/EMG studies and other studies pending follow-up evaluations  Radiofrequency rhizolysis lumbar facet medial branch nerves to be performed pending insurance approval as previously discussed  Ask the nurses and secretaries if insurance has approved you for radiofrequency  of the lumbar facets, medial branch nerves  Patient to call Pain Management Center should patient have concerns prior to scheduled return appointment

## 2016-06-30 ENCOUNTER — Ambulatory Visit: Payer: Self-pay | Admitting: Family Medicine

## 2016-07-01 LAB — TOXASSURE SELECT 13 (MW), URINE

## 2016-07-01 NOTE — Progress Notes (Signed)
Reviewed

## 2016-07-03 ENCOUNTER — Ambulatory Visit: Payer: Self-pay | Admitting: Unknown Physician Specialty

## 2016-07-10 ENCOUNTER — Ambulatory Visit (INDEPENDENT_AMBULATORY_CARE_PROVIDER_SITE_OTHER): Payer: Medicare Other | Admitting: Unknown Physician Specialty

## 2016-07-10 ENCOUNTER — Encounter: Payer: Self-pay | Admitting: Unknown Physician Specialty

## 2016-07-10 VITALS — BP 124/79 | HR 79 | Temp 98.1°F | Ht 62.6 in | Wt 209.6 lb

## 2016-07-10 DIAGNOSIS — G252 Other specified forms of tremor: Secondary | ICD-10-CM

## 2016-07-10 DIAGNOSIS — F329 Major depressive disorder, single episode, unspecified: Secondary | ICD-10-CM | POA: Diagnosis not present

## 2016-07-10 DIAGNOSIS — Z0000 Encounter for general adult medical examination without abnormal findings: Secondary | ICD-10-CM

## 2016-07-10 DIAGNOSIS — G8929 Other chronic pain: Secondary | ICD-10-CM | POA: Diagnosis not present

## 2016-07-10 DIAGNOSIS — E785 Hyperlipidemia, unspecified: Secondary | ICD-10-CM

## 2016-07-10 DIAGNOSIS — F322 Major depressive disorder, single episode, severe without psychotic features: Secondary | ICD-10-CM

## 2016-07-10 DIAGNOSIS — J449 Chronic obstructive pulmonary disease, unspecified: Secondary | ICD-10-CM | POA: Diagnosis not present

## 2016-07-10 MED ORDER — BENZONATATE 100 MG PO CAPS: 100.0000 mg | ORAL_CAPSULE | Freq: Two times a day (BID) | ORAL | 2 refills | Status: DC | PRN

## 2016-07-10 NOTE — Progress Notes (Signed)
BP 124/79 (BP Location: Left Arm, Patient Position: Sitting, Cuff Size: Large)   Pulse 79   Temp 98.1 F (36.7 C)   Ht 5' 2.6" (1.59 m)   Wt 209 lb 9.6 oz (95.1 kg)   SpO2 95%   BMI 37.60 kg/m    Subjective:    Patient ID: Susan Houston, female    DOB: [birth year], 55 y.o.   MRN: [id number]  HPI: Susan Houston is a 55 y.o. female  Chief Complaint  Patient presents with  . Medicare Wellness   Functional Status Survey: Is the patient deaf or have difficulty hearing?: No Does the patient have difficulty seeing, even when wearing glasses/contacts?: Yes Does the patient have difficulty concentrating, remembering, or making decisions?: Yes Does the patient have difficulty walking or climbing stairs?: Yes Does the patient have difficulty dressing or bathing?: No Does the patient have difficulty doing errands alone such as visiting a doctor's office or shopping?: Yes (pt states she does not drive) Depression screen [facility] 2/9 07/10/2016 05/21/2016 03/09/2016 01/02/2016 12/11/2015  Decreased Interest 3 0 0 0 0  Down, Depressed, Hopeless 0 0 0 0 0  PHQ - 2 Score 3 0 0 0 0  Altered sleeping 1 - - - -  Tired, decreased energy 1 - - - -  Change in appetite 1 - - - -  Feeling bad or failure about yourself  0 - - - -  Trouble concentrating 0 - - - -  Moving slowly or fidgety/restless 1 - - - -  Suicidal thoughts 0 - - - -  PHQ-9 Score 7 - - - -  Difficult doing work/chores - - - - -   Fall Risk  07/10/2016 06/24/2016 06/03/2016 05/21/2016 05/13/2016  Falls in the past year? Yes No (No Data) Yes No  Number falls in past yr: 2 or more - - 1 -  Injury with Fall? No - - No -  Risk Factor Category  - - - - -  Risk for fall due to : - - - - -  Follow up - - - - -   COPD Take Advair, Rescue inhaler and nebulizer as needed Would like a cough medicine "the good kind"   Feels symptoms are well controlled:yes Using medications without problems:yes Night time symptoms: ER visits since last  visit:none Missed work or school::no Increased cough:no Increased SOB: Using O2:none  Pain management Seeing pain management but Dr. Josefa Half is moving to [facility]  Mini cog Able to pass with some effort  Relevant past medical, surgical, family and social history reviewed and updated as indicated. Interim medical history since our last visit reviewed. Allergies and medications reviewed and updated.  Review of Systems  Per HPI unless specifically indicated above     Objective:    BP 124/79 (BP Location: Left Arm, Patient Position: Sitting, Cuff Size: Large)   Pulse 79   Temp 98.1 F (36.7 C)   Ht 5' 2.6" (1.59 m)   Wt 209 lb 9.6 oz (95.1 kg)   SpO2 95%   BMI 37.60 kg/m   Wt Readings from Last 3 Encounters:  07/10/16 209 lb 9.6 oz (95.1 kg)  06/24/16 197 lb (89.4 kg)  06/03/16 204 lb (92.5 kg)    Physical Exam  Constitutional: She is oriented to person, place, and time. She appears well-developed and well-nourished. No distress.  HENT:  Head: Normocephalic and atraumatic.  Eyes: Conjunctivae and lids are normal. Right eye  exhibits no discharge. Left eye exhibits no discharge. No scleral icterus.  Neck: Normal range of motion. Neck supple. No JVD present. Carotid bruit is not present.  Cardiovascular: Normal rate, regular rhythm and normal heart sounds.   Pulmonary/Chest: Effort normal and breath sounds normal.  Abdominal: Normal appearance. There is no splenomegaly or hepatomegaly.  Musculoskeletal: Normal range of motion.  Neurological: She is alert and oriented to person, place, and time.  Skin: Skin is warm, dry and intact. No rash noted. No pallor.  Psychiatric: She has a normal mood and affect. Her behavior is normal. Judgment and thought content normal.    Results for orders placed or performed in visit on 06/24/16  ToxASSURE Select 13 (MW), Urine  Result Value Ref Range   ToxAssure Select 13 FINAL       Assessment & Plan:    Problem List Items Addressed This Visit      Unprioritized   Chronic pain - Primary   Relevant Orders   Ambulatory referral to Pain Clinic   Coarse tremors   COPD (chronic obstructive pulmonary disease) ([place])    Not smoking.  O2 was 95%.  Stable, continue present medications. I can't prescribe regular use of Tussionex and will prescribe Tessalon Perles and will refer to pulmonary       Relevant Medications   benzonatate (TESSALON) 100 MG capsule   Other Relevant Orders   Ambulatory referral to Pulmonology   Hyperlipidemia    Check lipid panel      Relevant Orders   Lipid Panel w/o Chol/HDL Ratio   Severe depression    With psychiatry      Relevant Orders   Comprehensive metabolic panel    Other Visit Diagnoses    Annual physical exam       Relevant Orders   MM DIGITAL SCREENING BILATERAL       Follow up plan: Return in about 6 months (around 01/07/2017).

## 2016-07-10 NOTE — Assessment & Plan Note (Addendum)
Not smoking.  O2 was 95%.  Stable, continue present medications. I can't prescribe regular use of Tussionex and will prescribe Tessalon Perles and will refer to pulmonary

## 2016-07-10 NOTE — Assessment & Plan Note (Signed)
With psychiatry

## 2016-07-10 NOTE — Assessment & Plan Note (Signed)
Check lipid panel  

## 2016-07-11 LAB — COMPREHENSIVE METABOLIC PANEL
A/G RATIO: 1.4 (ref 1.2–2.2)
ALK PHOS: 101 IU/L (ref 39–117)
ALT: 12 IU/L (ref 0–32)
AST: 20 IU/L (ref 0–40)
Albumin: 4.1 g/dL (ref 3.5–5.5)
BUN/Creatinine Ratio: 20 (ref 9–23)
BUN: 17 mg/dL (ref 6–24)
Bilirubin Total: <0.2 mg/dL (ref 0.0–1.2)
CO2: 22 mmol/L (ref 18–29)
Calcium: 9 mg/dL (ref 8.7–10.2)
Chloride: 102 mmol/L (ref 96–106)
Creatinine, Ser: 0.83 mg/dL (ref 0.57–1.00)
GFR calc Af Amer: 91 mL/min/{1.73_m2} (ref >59)
GFR calc non Af Amer: 79 mL/min/{1.73_m2} (ref >59)
GLOBULIN, TOTAL: 2.9 g/dL (ref 1.5–4.5)
Glucose: 124 mg/dL — ABNORMAL HIGH (ref 65–99)
POTASSIUM: 4.5 mmol/L (ref 3.5–5.2)
SODIUM: 141 mmol/L (ref 134–144)
Total Protein: 7 g/dL (ref 6.0–8.5)

## 2016-07-11 LAB — LIPID PANEL W/O CHOL/HDL RATIO
CHOLESTEROL TOTAL: 164 mg/dL (ref 100–199)
HDL: 45 mg/dL (ref >39)
LDL Calculated: 87 mg/dL (ref 0–99)
TRIGLYCERIDES: 160 mg/dL — AB (ref 0–149)
VLDL Cholesterol Cal: 32 mg/dL (ref 5–40)

## 2016-07-13 ENCOUNTER — Encounter: Payer: Self-pay | Admitting: Unknown Physician Specialty

## 2016-07-13 NOTE — Progress Notes (Signed)
   There were no vitals taken for this visit.   Subjective:    Patient ID: Susan Houston, female    DOB: 05-23-1961, 55 y.o.   MRN: [id number]  HPI: Susan Houston is a 55 y.o. female  No chief complaint on file.   Relevant past medical, surgical, family and social history reviewed and updated as indicated. Interim medical history since our last visit reviewed. Allergies and medications reviewed and updated.  Review of Systems  Per HPI unless specifically indicated above     Objective:    There were no vitals taken for this visit.  Wt Readings from Last 3 Encounters:  07/10/16 209 lb 9.6 oz (95.1 kg)  06/24/16 197 lb (89.4 kg)  06/03/16 204 lb (92.5 kg)    Physical Exam  Results for orders placed or performed in visit on 07/10/16  Comprehensive metabolic panel  Result Value Ref Range   Glucose 124 (H) 65 - 99 mg/dL   BUN 17 6 - 24 mg/dL   Creatinine, Ser 0.83 0.57 - 1.00 mg/dL   GFR calc non Af Amer 79 >59 mL/min/1.73   GFR calc Af Amer 91 >59 mL/min/1.73   BUN/Creatinine Ratio 20 9 - 23   Sodium 141 134 - 144 mmol/L   Potassium 4.5 3.5 - 5.2 mmol/L   Chloride 102 96 - 106 mmol/L   CO2 22 18 - 29 mmol/L   Calcium 9.0 8.7 - 10.2 mg/dL   Total Protein 7.0 6.0 - 8.5 g/dL   Albumin 4.1 3.5 - 5.5 g/dL   Globulin, Total 2.9 1.5 - 4.5 g/dL   Albumin/Globulin Ratio 1.4 1.2 - 2.2   Bilirubin Total <0.2 0.0 - 1.2 mg/dL   Alkaline Phosphatase 101 39 - 117 IU/L   AST 20 0 - 40 IU/L   ALT 12 0 - 32 IU/L  Lipid Panel w/o Chol/HDL Ratio  Result Value Ref Range   Cholesterol, Total 164 100 - 199 mg/dL   Triglycerides 160 (H) 0 - 149 mg/dL   HDL 45 >39 mg/dL   VLDL Cholesterol Cal 32 5 - 40 mg/dL   LDL Calculated 87 0 - 99 mg/dL      Assessment & Plan:   Problem List Items Addressed This Visit    None    Visit Diagnoses   None.      Follow up plan: No Follow-up on file.

## 2016-07-29 ENCOUNTER — Institutional Professional Consult (permissible substitution): Payer: Self-pay | Admitting: Internal Medicine

## 2016-08-11 ENCOUNTER — Other Ambulatory Visit: Payer: Self-pay | Admitting: Pain Medicine

## 2016-08-12 NOTE — Progress Notes (Deleted)
[facility] Consultation      Assessment and Plan:  COPD/emphysema.  Severe anxiety/depression.  Nicotine abuse.   Date: 08/12/2016  MRN# [id number] Susan Houston 15-Jan-1961  Referring Physician:   JAYME MEDNICK is a 55 y.o. old female seen in consultation for chief complaint of:   No chief complaint on file.   HPI:   The patient is a 55 yo female with a history of COPD and she has a history of severe depression. She had a COPD exacerbation in May 2016.   PMHX:   Past Medical History:  Diagnosis Date  . Anxiety   . Arthritis   . Asthma   . Back pain   . Bruises easily   . 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 Chronic leg pain    BILATERAL  . COPD (chronic obstructive pulmonary disease) ([place])   . Depression   . Difficulty sleeping   . Diverticulitis   . Hemorrhoids   . History of kidney stones   . Hyperlipidemia   . Hypertension   . Incontinence of urine   . MRSA infection greater than 3 months ago    2008 right side of face  . Nerve damage    right leg   Surgical Hx:  Past Surgical History:  Procedure Laterality Date  . ABDOMINAL HYSTERECTOMY  1983  . BLADDER SUSPENSION  2014  . CHOLECYSTECTOMY    . EXCISION NEUROMA Bilateral 09/24/2014   Procedure: BILATERAL OPEN SAPHENOUS NEURECTOMIES AND OPEN LEFT PERIPATELLA OSETOPHYTECTOMY;  Surgeon: Mauri Pole, MD;  Location: [facility];  Service: Orthopedics;  Laterality: Bilateral;  . JOINT REPLACEMENT  2009/2010   BIL TOTAL KNEES  . TONSILLECTOMY     Family Hx:  Family History  Problem Relation Age of Onset  . Asthma Mother   . Diabetes Mother   . Hyperlipidemia Mother   . Hypertension Mother   . Arthritis Father   . Cancer Father   . Hyperlipidemia Father   . Hypertension Father   . Kidney Stones Son   . Stroke Maternal Grandmother   . Cancer Maternal Grandfather     lung  . Pneumonia Paternal Grandmother   . Alzheimer's disease Paternal  Grandfather   . Diabetes Sister   . Gout Sister   . Hypertension Sister   . Alcohol abuse Brother   . Heart disease Brother     massive MI   Social Hx:   Social History  Substance Use Topics  . Smoking status: Former Smoker    Packs/day: 0.50    Types: Cigarettes    Quit date: 04/07/2016  . Smokeless tobacco: Never Used     Comment: patient has not smoked x 10 days.  . Alcohol use No   Medication:   Reviewed    Allergies:  Escitalopram and Lexapro [escitalopram oxalate]  Review of Systems: Gen:  Denies  fever, sweats, chills HEENT: Denies blurred vision, double vision. bleeds, sore throat Cvc:  No dizziness, chest pain. Resp:   Denies cough or sputum production, shortness of breath Gi: Denies swallowing difficulty, stomach pain. Gu:  Denies bladder incontinence, burning urine Ext:   No Joint pain, stiffness. Skin: No skin rash,  hives  Endoc:  No polyuria, polydipsia. Psych: No depression, insomnia. Other:  All other systems were reviewed with the patient and were negative other that what is mentioned in the HPI.   Physical Examination:   VS: There were  no vitals taken for this visit.  General Appearance: No distress  Neuro:without focal findings,  speech normal,  HEENT: PERRLA, EOM intact.   Pulmonary: normal breath sounds, No wheezing.  CardiovascularNormal S1,S2.  No m/r/g.   Abdomen: Benign, Soft, non-tender. Renal:  No costovertebral tenderness  GU:  No performed at this time. Endoc: No evident thyromegaly, no signs of acromegaly. Skin:   warm, no rashes, no ecchymosis  Extremities: normal, no cyanosis, clubbing.  Other findings:    LABORATORY PANEL:   CBC No results for input(s): WBC, HGB, HCT, PLT in the last 168 hours. ------------------------------------------------------------------------------------------------------------------  Chemistries  No results for input(s): NA, K, CL, CO2, GLUCOSE, BUN, CREATININE, CALCIUM, MG, AST, ALT, ALKPHOS,  BILITOT in the last 168 hours.  Invalid input(s): GFRCGP ------------------------------------------------------------------------------------------------------------------  Cardiac Enzymes No results for input(s): TROPONINI in the last 168 hours. ------------------------------------------------------------  RADIOLOGY:  No results found.     Thank  you for the consultation and for allowing [facility] to assist in the care of your patient. Our recommendations are noted above.  Please contact us if we can be of further service.   Marda Stalker, MD.  Board Certified in Internal Medicine, Pulmonary Medicine, [place], and Sleep Medicine.  [facility] Office Number: [phone]  Patricia Pesa, M.D.  Vilinda Boehringer, M.D.  Merton Border, M.D  08/12/2016

## 2016-08-14 ENCOUNTER — Other Ambulatory Visit: Payer: Self-pay | Admitting: Unknown Physician Specialty

## 2016-08-21 ENCOUNTER — Encounter: Payer: Self-pay | Admitting: *Deleted

## 2016-08-21 ENCOUNTER — Institutional Professional Consult (permissible substitution): Payer: Self-pay | Admitting: Internal Medicine

## 2016-08-31 ENCOUNTER — Ambulatory Visit (INDEPENDENT_AMBULATORY_CARE_PROVIDER_SITE_OTHER): Payer: Medicare Other | Admitting: Unknown Physician Specialty

## 2016-08-31 ENCOUNTER — Encounter: Payer: Self-pay | Admitting: Unknown Physician Specialty

## 2016-08-31 VITALS — BP 105/72 | HR 55 | Temp 97.8°F | Wt 210.8 lb

## 2016-08-31 DIAGNOSIS — L509 Urticaria, unspecified: Secondary | ICD-10-CM

## 2016-08-31 MED ORDER — BETAMETHASONE DIPROPIONATE AUG 0.05 % EX CREA: TOPICAL_CREAM | Freq: Two times a day (BID) | CUTANEOUS | 0 refills | Status: DC

## 2016-08-31 MED ORDER — HYDROXYZINE HCL 10 MG PO TABS: 10.0000 mg | ORAL_TABLET | Freq: Three times a day (TID) | ORAL | 0 refills | Status: DC | PRN

## 2016-08-31 MED ORDER — PREDNISONE 20 MG PO TABS: ORAL_TABLET | ORAL | 0 refills | Status: DC

## 2016-08-31 NOTE — Addendum Note (Signed)
Addended by: Kathrine Haddock on: 08/31/2016 04:27 PM   Modules accepted: Orders

## 2016-08-31 NOTE — Progress Notes (Signed)
BP 105/72 (BP Location: Left Arm, Patient Position: Sitting, Cuff Size: Large)   Pulse (!) 55   Temp 97.8 F (36.6 C)   Wt 210 lb 12.8 oz (95.6 kg)   SpO2 93%   BMI 37.82 kg/m    Subjective:    Patient ID: Susan Houston, female    DOB: 10-28-1960, 55 y.o.   MRN: [id number]  HPI: Susan Houston is a 55 y.o. female  Chief Complaint  Patient presents with  . Rash    pt states she has a rash on her arms and chest. States the rash appeared a few weeks ago   Pt states she started with a rash about 2 weeks ago on her right hand.  It has spread over her lower arms and neck.  No new plants, medications, soaps.   States it itches "all the time"    Relevant past medical, surgical, family and social history reviewed and updated as indicated. Interim medical history since our last visit reviewed. Allergies and medications reviewed and updated.  Review of Systems  Per HPI unless specifically indicated above     Objective:    BP 105/72 (BP Location: Left Arm, Patient Position: Sitting, Cuff Size: Large)   Pulse (!) 55   Temp 97.8 F (36.6 C)   Wt 210 lb 12.8 oz (95.6 kg)   SpO2 93%   BMI 37.82 kg/m   Wt Readings from Last 3 Encounters:  08/31/16 210 lb 12.8 oz (95.6 kg)  07/10/16 209 lb 9.6 oz (95.1 kg)  06/24/16 197 lb (89.4 kg)    Physical Exam  Constitutional: She is oriented to person, place, and time. She appears well-developed and well-nourished. No distress.  HENT:  Head: Normocephalic and atraumatic.  Eyes: Conjunctivae and lids are normal. Right eye exhibits no discharge. Left eye exhibits no discharge. No scleral icterus.  Cardiovascular: Normal rate.   Pulmonary/Chest: Effort normal.  Abdominal: Normal appearance. There is no splenomegaly or hepatomegaly.  Musculoskeletal: Normal range of motion.  Neurological: She is alert and oriented to person, place, and time.  Skin: Skin is intact. Rash noted. No pallor.  Pt with papular rash on wrists and neck.    Psychiatric:  She has a normal mood and affect. Her behavior is normal. Judgment and thought content normal.    Results for orders placed or performed in visit on 07/10/16  Comprehensive metabolic panel  Result Value Ref Range   Glucose 124 (H) 65 - 99 mg/dL   BUN 17 6 - 24 mg/dL   Creatinine, Ser 0.83 0.57 - 1.00 mg/dL   GFR calc non Af Amer 79 >59 mL/min/1.73   GFR calc Af Amer 91 >59 mL/min/1.73   BUN/Creatinine Ratio 20 9 - 23   Sodium 141 134 - 144 mmol/L   Potassium 4.5 3.5 - 5.2 mmol/L   Chloride 102 96 - 106 mmol/L   CO2 22 18 - 29 mmol/L   Calcium 9.0 8.7 - 10.2 mg/dL   Total Protein 7.0 6.0 - 8.5 g/dL   Albumin 4.1 3.5 - 5.5 g/dL   Globulin, Total 2.9 1.5 - 4.5 g/dL   Albumin/Globulin Ratio 1.4 1.2 - 2.2   Bilirubin Total <0.2 0.0 - 1.2 mg/dL   Alkaline Phosphatase 101 39 - 117 IU/L   AST 20 0 - 40 IU/L   ALT 12 0 - 32 IU/L  Lipid Panel w/o Chol/HDL Ratio  Result Value Ref Range   Cholesterol, Total 164 100 - 199 mg/dL  Triglycerides 160 (H) 0 - 149 mg/dL   HDL 45 >39 mg/dL   VLDL Cholesterol Cal 32 5 - 40 mg/dL   LDL Calculated 87 0 - 99 mg/dL      Assessment & Plan:   Problem List Items Addressed This Visit    None    Visit Diagnoses    Urticaria    -  Primary   Relevant Orders   Ambulatory referral to Dermatology      Refer to Dermatology as rash looks a bit like psorasis.   Follow up plan: No Follow-up on file.

## 2016-09-08 ENCOUNTER — Telehealth: Payer: Self-pay | Admitting: Family Medicine

## 2016-09-08 NOTE — Telephone Encounter (Signed)
Pt called would like to know if Dr. Wynetta Emery can call something in for her for migraines. Pt advised she would likely need an appt. Pharm is [facility] in [place]. Thanks.

## 2016-09-08 NOTE — Telephone Encounter (Signed)
Routing to provider  

## 2016-09-09 NOTE — Telephone Encounter (Signed)
Yes, she would need seen

## 2016-09-09 NOTE — Telephone Encounter (Signed)
Called and scheduled the patient an appointment with Dr. Wynetta Emery for Friday at 8:30.

## 2016-09-11 ENCOUNTER — Encounter: Payer: Self-pay | Admitting: Family Medicine

## 2016-09-11 ENCOUNTER — Ambulatory Visit (INDEPENDENT_AMBULATORY_CARE_PROVIDER_SITE_OTHER): Payer: Medicare Other | Admitting: Family Medicine

## 2016-09-11 VITALS — BP 104/64 | HR 54 | Temp 97.5°F | Wt 210.0 lb

## 2016-09-11 DIAGNOSIS — G43009 Migraine without aura, not intractable, without status migrainosus: Secondary | ICD-10-CM | POA: Insufficient documentation

## 2016-09-11 HISTORY — DX: Migraine without aura, not intractable, without status migrainosus: G43.009

## 2016-09-11 MED ORDER — SUMATRIPTAN SUCCINATE 100 MG PO TABS: ORAL_TABLET | ORAL | 1 refills | Status: DC

## 2016-09-11 MED ORDER — TOPIRAMATE 100 MG PO TABS: 100.0000 mg | ORAL_TABLET | Freq: Two times a day (BID) | ORAL | 3 refills | Status: DC

## 2016-09-11 MED ORDER — KETOROLAC TROMETHAMINE 60 MG/2ML IM SOLN
60.0000 mg | Freq: Once | INTRAMUSCULAR | Status: AC
  Administered 2016-09-11: 60 mg via INTRAMUSCULAR

## 2016-09-11 NOTE — Progress Notes (Signed)
BP 104/64 (BP Location: Left Arm, Patient Position: Sitting, Cuff Size: Normal)   Pulse (!) 54   Temp 97.5 F (36.4 C)   Wt 210 lb (95.3 kg)   SpO2 99%   BMI 37.68 kg/m    Subjective:    Patient ID: Susan Houston, female    DOB: [birth year], 55 y.o.   MRN: [id number]  HPI: Susan Houston is a 55 y.o. female  Chief Complaint  Patient presents with  . Migraine   MIGRAINES Duration: 2 weeks Onset: sudden Severity: severe Quality: feels like the top of her head is coming off Frequency: intermittent Location: whole head Headache duration: several hours Radiation: no Time of day headache occurs: at random Alleviating factors: Nothing Aggravating factors: light Headache status at time of visit: current headache Treatments attempted: Treatments attempted: rest, ice, heat, aleve", excedrine and topamax   Aura: no Nausea:  yes Vomiting: yes Photophobia:  yes Phonophobia:  yes Effect on social functioning:  yes Confusion:  yes Gait disturbance/ataxia:  yes Behavioral changes:  no Fevers:  no  Relevant past medical, surgical, family and social history reviewed and updated as indicated. Interim medical history since our last visit reviewed. Allergies and medications reviewed and updated.  Review of Systems  Constitutional: Negative.   Respiratory: Negative.   Cardiovascular: Negative.   Neurological: Negative.   Psychiatric/Behavioral: Negative.     Per HPI unless specifically indicated above     Objective:    BP 104/64 (BP Location: Left Arm, Patient Position: Sitting, Cuff Size: Normal)   Pulse (!) 54   Temp 97.5 F (36.4 C)   Wt 210 lb (95.3 kg)   SpO2 99%   BMI 37.68 kg/m   Wt Readings from Last 3 Encounters:  09/11/16 210 lb (95.3 kg)  08/31/16 210 lb 12.8 oz (95.6 kg)  07/10/16 209 lb 9.6 oz (95.1 kg)    Physical Exam  Constitutional: She is oriented to person, place, and time. She appears well-developed and well-nourished. No distress.  HENT:  Head:  Normocephalic and atraumatic.  Right Ear: Hearing normal.  Left Ear: Hearing normal.  Nose: Nose normal.  Eyes: Conjunctivae and lids are normal. Right eye exhibits no discharge. Left eye exhibits no discharge. No scleral icterus.  Cardiovascular: Normal rate, regular rhythm, normal heart sounds and intact distal pulses.  Exam reveals no gallop and no friction rub.   No murmur heard. Pulmonary/Chest: Effort normal and breath sounds normal. No respiratory distress. She has no wheezes. She has no rales. She exhibits no tenderness.  Musculoskeletal: Normal range of motion.  Neurological: She is alert and oriented to person, place, and time.  Skin: Skin is warm, dry and intact. No rash noted. No erythema. No pallor.  Psychiatric: She has a normal mood and affect. Her speech is normal and behavior is normal. Judgment and thought content normal. Cognition and memory are normal.  Nursing note and vitals reviewed.   Results for orders placed or performed in visit on 07/10/16  Comprehensive metabolic panel  Result Value Ref Range   Glucose 124 (H) 65 - 99 mg/dL   BUN 17 6 - 24 mg/dL   Creatinine, Ser 0.83 0.57 - 1.00 mg/dL   GFR calc non Af Amer 79 >59 mL/min/1.73   GFR calc Af Amer 91 >59 mL/min/1.73   BUN/Creatinine Ratio 20 9 - 23   Sodium 141 134 - 144 mmol/L   Potassium 4.5 3.5 - 5.2 mmol/L   Chloride 102 96 - 106 mmol/L  CO2 22 18 - 29 mmol/L   Calcium 9.0 8.7 - 10.2 mg/dL   Total Protein 7.0 6.0 - 8.5 g/dL   Albumin 4.1 3.5 - 5.5 g/dL   Globulin, Total 2.9 1.5 - 4.5 g/dL   Albumin/Globulin Ratio 1.4 1.2 - 2.2   Bilirubin Total <0.2 0.0 - 1.2 mg/dL   Alkaline Phosphatase 101 39 - 117 IU/L   AST 20 0 - 40 IU/L   ALT 12 0 - 32 IU/L  Lipid Panel w/o Chol/HDL Ratio  Result Value Ref Range   Cholesterol, Total 164 100 - 199 mg/dL   Triglycerides 160 (H) 0 - 149 mg/dL   HDL 45 >39 mg/dL   VLDL Cholesterol Cal 32 5 - 40 mg/dL   LDL Calculated 87 0 - 99 mg/dL      Assessment &  Plan:   Problem List Items Addressed This Visit      Cardiovascular and Mediastinum   Migraine without aura and without status migrainosus, not intractable - Primary    Will obtain MRI given changing migraine. Toradol shot given today. Will increase topamax to '100mg'$  BID. (Note sent to psychiatry about change). Rx for imitrex given. Follow up 2-3 weeks to see how migraines are doing.       Relevant Medications   ketorolac (TORADOL) injection 60 mg (Completed)   topiramate (TOPAMAX) 100 MG tablet   SUMAtriptan (IMITREX) 100 MG tablet   Other Relevant Orders   MR Brain Wo Contrast       Follow up plan: Return 2-3 weeks , for Follow up migraines.

## 2016-09-11 NOTE — Assessment & Plan Note (Signed)
Will obtain MRI given changing migraine. Toradol shot given today. Will increase topamax to '100mg'$  BID. (Note sent to psychiatry about change). Rx for imitrex given. Follow up 2-3 weeks to see how migraines are doing.

## 2016-09-17 ENCOUNTER — Telehealth: Payer: Self-pay | Admitting: Family Medicine

## 2016-09-17 DIAGNOSIS — G43009 Migraine without aura, not intractable, without status migrainosus: Secondary | ICD-10-CM

## 2016-09-17 NOTE — Telephone Encounter (Signed)
Pt called stated she is having terrible migraines stated that she does not want to take the medication Dr. Wynetta Emery prescribed because when she takes it she falls. Please call pt to follow up. Thanks.

## 2016-09-17 NOTE — Telephone Encounter (Signed)
Patient notified of [name]'s response.

## 2016-09-17 NOTE — Telephone Encounter (Signed)
Called and left a message for the patient to return my call.

## 2016-09-17 NOTE — Telephone Encounter (Signed)
If she is falling, go back to previous dose of topamax.  MRI is pending and I put in a neurology referral.

## 2016-09-17 NOTE — Telephone Encounter (Signed)
Working on patient's referral. Patient had a referral for [facility] in April. Patient no-showed.   Try [facility] again or send to [facility]?

## 2016-09-17 NOTE — Telephone Encounter (Signed)
[name]: Can you please call this patient, she was just seen by Dr.Johnson.

## 2016-09-25 ENCOUNTER — Encounter: Payer: Self-pay | Admitting: Family Medicine

## 2016-09-25 ENCOUNTER — Other Ambulatory Visit: Payer: Self-pay | Admitting: Family Medicine

## 2016-09-25 ENCOUNTER — Ambulatory Visit (INDEPENDENT_AMBULATORY_CARE_PROVIDER_SITE_OTHER): Payer: Medicare Other | Admitting: Family Medicine

## 2016-09-25 VITALS — BP 117/74 | HR 67 | Temp 98.1°F | Wt 212.8 lb

## 2016-09-25 DIAGNOSIS — G43009 Migraine without aura, not intractable, without status migrainosus: Secondary | ICD-10-CM

## 2016-09-25 MED ORDER — PROMETHAZINE HCL 25 MG/ML IJ SOLN
25.0000 mg | Freq: Once | INTRAMUSCULAR | Status: AC
  Administered 2016-09-25: 25 mg via INTRAMUSCULAR

## 2016-09-25 MED ORDER — KETOROLAC TROMETHAMINE 60 MG/2ML IM SOLN
60.0000 mg | Freq: Once | INTRAMUSCULAR | Status: AC
  Administered 2016-09-25: 60 mg via INTRAMUSCULAR

## 2016-09-25 MED ORDER — AMITRIPTYLINE HCL 10 MG PO TABS: 10.0000 mg | ORAL_TABLET | Freq: Every day | ORAL | 1 refills | Status: DC

## 2016-09-25 NOTE — Progress Notes (Signed)
BP 117/74 (BP Location: Right Arm, Patient Position: Sitting, Cuff Size: Large)   Pulse 67   Temp 98.1 F (36.7 C)   Wt 212 lb 12.8 oz (96.5 kg)   SpO2 96%   BMI 38.18 kg/m    Subjective:    Patient ID: Susan Houston, female    DOB: June 09, 1961, 55 y.o.   MRN: [id number]  HPI: Susan Houston is a 55 y.o. female  Chief Complaint  Patient presents with  . Migraine   MIGRAINES- did not do well on the imitrex, it made her fall. Doing well on the higher dose of the topamax. She has an appointment with Dr. Melrose Nakayama 11/03/16 and the MRI scheduled for 10/06/16 Duration: 4 weeks Onset: sudden Severity: severe Quality: Still feels like the top of her head is coming off Frequency: 3-4x a week Location: whole head Headache duration: several hours Radiation: no Time of day headache occurs: at random Alleviating factors: nothing Aggravating factors: light Headache status at time of visit: current headache Treatments attempted: Treatments attempted: rest, ice, heat, APAP, ibuprofen, aleve", excedrine, triptans and topamax   Aura: no Nausea:  yes Vomiting: yes Photophobia:  yes Phonophobia:  yes Effect on social functioning:  yes Confusion:  yes Gait disturbance/ataxia:  yes Behavioral changes:  no Fevers:  no  Relevant past medical, surgical, family and social history reviewed and updated as indicated. Interim medical history since our last visit reviewed. Allergies and medications reviewed and updated.  Review of Systems  Constitutional: Negative.   HENT: Negative.   Respiratory: Negative.   Cardiovascular: Negative.   Neurological: Positive for light-headedness and headaches. Negative for dizziness, tremors, seizures, syncope, facial asymmetry, speech difficulty, weakness and numbness.  Psychiatric/Behavioral: Negative.     Per HPI unless specifically indicated above     Objective:    BP 117/74 (BP Location: Right Arm, Patient Position: Sitting, Cuff Size: Large)   Pulse 67    Temp 98.1 F (36.7 C)   Wt 212 lb 12.8 oz (96.5 kg)   SpO2 96%   BMI 38.18 kg/m   Wt Readings from Last 3 Encounters:  09/25/16 212 lb 12.8 oz (96.5 kg)  09/11/16 210 lb (95.3 kg)  08/31/16 210 lb 12.8 oz (95.6 kg)    Physical Exam  Constitutional: She is oriented to person, place, and time. She appears well-developed and well-nourished. No distress.  HENT:  Head: Normocephalic and atraumatic.  Right Ear: Hearing normal.  Left Ear: Hearing normal.  Nose: Nose normal.  Eyes: Conjunctivae and lids are normal. Right eye exhibits no discharge. Left eye exhibits no discharge. No scleral icterus.  Cardiovascular: Normal rate, regular rhythm, normal heart sounds and intact distal pulses.  Exam reveals no gallop and no friction rub.   No murmur heard. Pulmonary/Chest: Breath sounds normal. No respiratory distress. She has no wheezes. She has no rales. She exhibits no tenderness.  Musculoskeletal: Normal range of motion.  Neurological: She is alert and oriented to person, place, and time.  Skin: Skin is warm, dry and intact. No rash noted. No erythema. No pallor.  Psychiatric: She has a normal mood and affect. Her speech is normal and behavior is normal. Judgment and thought content normal. Cognition and memory are normal.  Nursing note and vitals reviewed.   Results for orders placed or performed in visit on 07/10/16  Comprehensive metabolic panel  Result Value Ref Range   Glucose 124 (H) 65 - 99 mg/dL   BUN 17 6 - 24 mg/dL  Creatinine, Ser 0.83 0.57 - 1.00 mg/dL   GFR calc non Af Amer 79 >59 mL/min/1.73   GFR calc Af Amer 91 >59 mL/min/1.73   BUN/Creatinine Ratio 20 9 - 23   Sodium 141 134 - 144 mmol/L   Potassium 4.5 3.5 - 5.2 mmol/L   Chloride 102 96 - 106 mmol/L   CO2 22 18 - 29 mmol/L   Calcium 9.0 8.7 - 10.2 mg/dL   Total Protein 7.0 6.0 - 8.5 g/dL   Albumin 4.1 3.5 - 5.5 g/dL   Globulin, Total 2.9 1.5 - 4.5 g/dL   Albumin/Globulin Ratio 1.4 1.2 - 2.2   Bilirubin  Total <0.2 0.0 - 1.2 mg/dL   Alkaline Phosphatase 101 39 - 117 IU/L   AST 20 0 - 40 IU/L   ALT 12 0 - 32 IU/L  Lipid Panel w/o Chol/HDL Ratio  Result Value Ref Range   Cholesterol, Total 164 100 - 199 mg/dL   Triglycerides 160 (H) 0 - 149 mg/dL   HDL 45 >39 mg/dL   VLDL Cholesterol Cal 32 5 - 40 mg/dL   LDL Calculated 87 0 - 99 mg/dL      Assessment & Plan:   Problem List Items Addressed This Visit      Cardiovascular and Mediastinum   Migraine without aura and without status migrainosus, not intractable - Primary    Did fine on the higher dose of the topamax. Did not tolerate imitrex. Still with an 8/10 headache today. Toradol shot and phenergan given today. Will see neurology on 11/03/16. Will have MRI done 10/06/16. Will start her on '10mg'$  amitriptyline for migraine prophylaxis and recheck 3-4 weeks.       Relevant Medications   amitriptyline (ELAVIL) 10 MG tablet   ketorolac (TORADOL) injection 60 mg (Start on 09/25/2016 11:45 AM)   promethazine (PHENERGAN) injection 25 mg (Start on 09/25/2016 11:45 AM)       Follow up plan: Return 3-4 weeks, for follow up migraine.

## 2016-09-25 NOTE — Assessment & Plan Note (Signed)
Did fine on the higher dose of the topamax. Did not tolerate imitrex. Still with an 8/10 headache today. Toradol shot and phenergan given today. Will see neurology on 11/03/16. Will have MRI done 10/06/16. Will start her on '10mg'$  amitriptyline for migraine prophylaxis and recheck 3-4 weeks.

## 2016-09-28 ENCOUNTER — Ambulatory Visit
Admission: RE | Admit: 2016-09-28 | Discharge: 2016-09-28 | Disposition: A | Discharge status: Home / Self Care | Payer: Medicare Other | Source: Ambulatory Visit | Attending: Family Medicine | Admitting: Family Medicine

## 2016-09-28 ENCOUNTER — Telehealth: Payer: Self-pay | Admitting: Family Medicine

## 2016-09-28 DIAGNOSIS — G43709 Chronic migraine without aura, not intractable, without status migrainosus: Secondary | ICD-10-CM | POA: Diagnosis not present

## 2016-09-28 DIAGNOSIS — G43009 Migraine without aura, not intractable, without status migrainosus: Secondary | ICD-10-CM | POA: Insufficient documentation

## 2016-09-28 NOTE — Telephone Encounter (Signed)
I called to let Susan Houston know that her MRI came back normal. Will keep to current plan and have her see neurology.

## 2016-10-06 ENCOUNTER — Ambulatory Visit: Payer: Medicare Other

## 2016-10-21 DIAGNOSIS — Z79899 Other long term (current) drug therapy: Secondary | ICD-10-CM | POA: Diagnosis not present

## 2016-10-23 ENCOUNTER — Encounter: Payer: Medicare Other | Admitting: Unknown Physician Specialty

## 2016-11-06 ENCOUNTER — Ambulatory Visit (INDEPENDENT_AMBULATORY_CARE_PROVIDER_SITE_OTHER): Payer: Medicare Other | Admitting: Family Medicine

## 2016-11-06 ENCOUNTER — Encounter: Payer: Self-pay | Admitting: Family Medicine

## 2016-11-06 ENCOUNTER — Ambulatory Visit: Payer: Medicare Other

## 2016-11-06 VITALS — BP 140/82 | HR 57 | Wt 214.2 lb

## 2016-11-06 DIAGNOSIS — R1310 Dysphagia, unspecified: Secondary | ICD-10-CM | POA: Diagnosis not present

## 2016-11-06 DIAGNOSIS — G43009 Migraine without aura, not intractable, without status migrainosus: Secondary | ICD-10-CM

## 2016-11-06 NOTE — Patient Instructions (Addendum)
Dysphagia Swallowing problems (dysphagia) occur when solids and liquids seem to stick in your throat on the way down to your stomach, or the food takes longer to get to the stomach. Other symptoms include regurgitating food, noises coming from the throat, chest discomfort with swallowing, and a feeling of fullness or the feeling of something being stuck in your throat when swallowing. When blockage in your throat is complete, it may be associated with drooling. CAUSES  Problems with swallowing may occur because of problems with the muscles. The food cannot be propelled in the usual manner into your stomach. You may have ulcers, scar tissue, or inflammation in the tube down which food travels from your mouth to your stomach (esophagus), which blocks food from passing normally into the stomach. Causes of inflammation include:  Acid reflux from your stomach into your esophagus.  Infection.  Radiation treatment for cancer.  Medicines taken without enough fluids to wash them down into your stomach. You may have nerve problems that prevent signals from being sent to the muscles of your esophagus to contract and move your food down to your stomach. Globus pharyngeus is a relatively common problem in which there is a sense of an obstruction or difficulty in swallowing, without any physical abnormalities of the swallowing passages being found. This problem usually improves over time with reassurance and testing to rule out other causes. DIAGNOSIS Dysphagia can be diagnosed and its cause can be determined by tests in which you swallow a white substance that helps illuminate the inside of your throat (contrast medium) while X-rays are taken. Sometimes a flexible telescope that is inserted down your throat (endoscopy) to look at your esophagus and stomach is used. TREATMENT   If the dysphagia is caused by acid reflux or infection, medicines may be used.  If the dysphagia is caused by problems with your  swallowing muscles, swallowing therapy may be used to help you strengthen your swallowing muscles.  If the dysphagia is caused by a blockage or mass, procedures to remove the blockage may be done. HOME CARE INSTRUCTIONS  Try to eat soft food that is easier to swallow and check your weight on a daily basis to be sure that it is not decreasing.  Be sure to drink liquids when sitting upright (not lying down). SEEK MEDICAL CARE IF:  You are losing weight because you are unable to swallow.  You are coughing when you drink liquids (aspiration).  You are coughing up partially digested food. SEEK IMMEDIATE MEDICAL CARE IF:  You are unable to swallow your own saliva?Marland Kitchen  You are having shortness of breath or a fever, or both.?  You have a hoarse voice along with difficulty swallowing. MAKE SURE YOU:  Understand these instructions.  Will watch your condition.  Will get help right away if you are not doing well or get worse. This information is not intended to replace advice given to you by your health care provider. Make sure you discuss any questions you have with your health care provider. Document Released: 10/02/2000 Document Revised: 10/26/2014 Document Reviewed: 03/24/2013 Elsevier Interactive Patient Education  2017 [employer].

## 2016-11-06 NOTE — Assessment & Plan Note (Signed)
No better. Informed patient that there is not much else we can do for her migraines. Encouraged her to keep her appointment with neurology. We will call them on Monday to see if we can get her in ASAP.

## 2016-11-06 NOTE — Progress Notes (Signed)
BP 140/82 (BP Location: Left Arm, Patient Position: Sitting, Cuff Size: Large)   Pulse (!) 57   Wt 214 lb 3.2 oz (97.2 kg)   SpO2 97%   BMI 38.43 kg/m    Subjective:    Patient ID: Susan Houston, female    DOB: 1961/04/27, 56 y.o.   MRN: [id number]  HPI: Susan Houston is a 56 y.o. female  Chief Complaint  Patient presents with  . Migraine   MIGRAINES- doesn't feel any better. Didn't feel any difference with the amitriptyline. Didn't see Dr. Melrose Nakayama on 1/16 because she forgot. No better.  Duration: 8 weeks Onset: sudden Severity: severe Quality: Still feels like the top of her head is coming off Frequency: 3-4x a week Location: whole head Headache duration: several hours Radiation: no Time of day headache occurs: at random Alleviating factors: nothing Aggravating factors: light Headache status at time of visit: current headache Treatments attempted: Treatments attempted: rest, ice, heat, APAP, ibuprofen, aleve", excedrine, triptans and topamax   Aura: no Nausea:  yes Vomiting: yes Photophobia:  yes Phonophobia:  yes Effect on social functioning:  yes Confusion:  yes Gait disturbance/ataxia:  yes Behavioral changes:  no Fevers:  no  Dysphagia for several weeks- having trouble with liquids and solids. Has had a EGD in the past with dilation, last had it done 4-5 years ago.    Relevant past medical, surgical, family and social history reviewed and updated as indicated. Interim medical history since our last visit reviewed. Allergies and medications reviewed and updated.  Review of Systems  Constitutional: Negative.   Respiratory: Negative.   Cardiovascular: Negative.   Musculoskeletal: Negative.   Neurological: Positive for headaches. Negative for dizziness, tremors, seizures, syncope, facial asymmetry, speech difficulty, weakness, light-headedness and numbness.  Psychiatric/Behavioral: Negative.     Per HPI unless specifically indicated above     Objective:      BP 140/82 (BP Location: Left Arm, Patient Position: Sitting, Cuff Size: Large)   Pulse (!) 57   Wt 214 lb 3.2 oz (97.2 kg)   SpO2 97%   BMI 38.43 kg/m   Wt Readings from Last 3 Encounters:  11/06/16 214 lb 3.2 oz (97.2 kg)  09/25/16 212 lb 12.8 oz (96.5 kg)  09/11/16 210 lb (95.3 kg)    Physical Exam  Constitutional: She is oriented to person, place, and time. She appears well-developed and well-nourished. No distress.  HENT:  Head: Normocephalic and atraumatic.  Right Ear: Hearing normal.  Left Ear: Hearing normal.  Nose: Nose normal.  Eyes: Conjunctivae and lids are normal. Right eye exhibits no discharge. Left eye exhibits no discharge. No scleral icterus.  Cardiovascular: Normal rate, regular rhythm, normal heart sounds and intact distal pulses.  Exam reveals no gallop and no friction rub.   No murmur heard. Pulmonary/Chest: Effort normal and breath sounds normal. No respiratory distress. She has no wheezes. She has no rales. She exhibits no tenderness.  Musculoskeletal: Normal range of motion.  Neurological: She is alert and oriented to person, place, and time.  Skin: Skin is warm, dry and intact. No rash noted. No erythema. No pallor.  Psychiatric: She has a normal mood and affect. Her speech is normal and behavior is normal. Judgment and thought content normal. Cognition and memory are normal.  Nursing note and vitals reviewed.   Results for orders placed or performed in visit on 07/10/16  Comprehensive metabolic panel  Result Value Ref Range   Glucose 124 (H) 65 - 99 mg/dL  BUN 17 6 - 24 mg/dL   Creatinine, Ser 0.83 0.57 - 1.00 mg/dL   GFR calc non Af Amer 79 >59 mL/min/1.73   GFR calc Af Amer 91 >59 mL/min/1.73   BUN/Creatinine Ratio 20 9 - 23   Sodium 141 134 - 144 mmol/L   Potassium 4.5 3.5 - 5.2 mmol/L   Chloride 102 96 - 106 mmol/L   CO2 22 18 - 29 mmol/L   Calcium 9.0 8.7 - 10.2 mg/dL   Total Protein 7.0 6.0 - 8.5 g/dL   Albumin 4.1 3.5 - 5.5 g/dL    Globulin, Total 2.9 1.5 - 4.5 g/dL   Albumin/Globulin Ratio 1.4 1.2 - 2.2   Bilirubin Total <0.2 0.0 - 1.2 mg/dL   Alkaline Phosphatase 101 39 - 117 IU/L   AST 20 0 - 40 IU/L   ALT 12 0 - 32 IU/L  Lipid Panel w/o Chol/HDL Ratio  Result Value Ref Range   Cholesterol, Total 164 100 - 199 mg/dL   Triglycerides 160 (H) 0 - 149 mg/dL   HDL 45 >39 mg/dL   VLDL Cholesterol Cal 32 5 - 40 mg/dL   LDL Calculated 87 0 - 99 mg/dL      Assessment & Plan:   Problem List Items Addressed This Visit      Cardiovascular and Mediastinum   Migraine without aura and without status migrainosus, not intractable - Primary    No better. Informed patient that there is not much else we can do for her migraines. Encouraged her to keep her appointment with neurology. We will call them on Monday to see if we can get her in ASAP.        Other Visit Diagnoses    Dysphagia, unspecified type       Will get her into see Dr. Vicente Males at GI. Referral generated today and appointment scheduled for Wednesday at 2:15PM. Call with any concerns.    Relevant Orders   Ambulatory referral to General Surgery       Follow up plan: Return if symptoms worsen or fail to improve.

## 2016-11-09 ENCOUNTER — Other Ambulatory Visit: Payer: Self-pay | Admitting: *Deleted

## 2016-11-11 ENCOUNTER — Ambulatory Visit (INDEPENDENT_AMBULATORY_CARE_PROVIDER_SITE_OTHER): Payer: Medicare Other | Admitting: Gastroenterology

## 2016-11-11 ENCOUNTER — Encounter: Payer: Self-pay | Admitting: Gastroenterology

## 2016-11-11 ENCOUNTER — Other Ambulatory Visit: Payer: Self-pay

## 2016-11-11 VITALS — BP 134/84 | HR 67 | Ht 62.0 in | Wt 216.0 lb

## 2016-11-11 DIAGNOSIS — R1310 Dysphagia, unspecified: Secondary | ICD-10-CM

## 2016-11-11 MED ORDER — OMEPRAZOLE 40 MG PO CPDR: 40.0000 mg | DELAYED_RELEASE_CAPSULE | Freq: Every day | ORAL | 1 refills | Status: DC

## 2016-11-11 MED ORDER — OMEPRAZOLE 40 MG PO CPDR: 40.0000 mg | DELAYED_RELEASE_CAPSULE | Freq: Every day | ORAL | 0 refills | Status: DC

## 2016-11-11 NOTE — Patient Instructions (Addendum)
EGD  SCHEDULED February 2,2018  @ [place] ON December 23, 2016 @ 2:00 PM

## 2016-11-11 NOTE — Progress Notes (Signed)
Gastroenterology Consultation Primary Care Physician:  Park Liter, DO Primary Gastroenterologist:  Dr. Jonathon Bellows  Reason for Consultation:     Difficulty swallowing         HPI:   Susan Houston is a 56 y.o. y/o female here for evaluation for difficulty with swallowing.   Dysphagia: Onset and any progression: says began about a few weeks back , gradually got worse  Frequency: almost everytime she eats  Foods affected : solids and liquids Prior episodes of impaction: yes- not required a hospital visit for the same  History of asthma/allergy : only COPD History of heartburn/Reflux :no  Weight loss : no , gained weight recently 30 lbs over 6 months   Prior EGD: last EGD and dilation was 5 years bacl  PPI/H2 blocker use : TUMS- does not help.  Says she takes some exedrin when she has a migraine and over the last 3 months been more often.   Past Medical History:  Diagnosis Date  . Anxiety   . Arthritis   . Asthma   . Back pain   . Bruises easily   . Cancer ([place]) 2009   LARYNX CANCER - CHEMO / RADIATION (NO SURGERY)   . Chronic leg pain    BILATERAL  . COPD (chronic obstructive pulmonary disease) ([place])   . Depression   . Difficulty sleeping   . Diverticulitis   . Hemorrhoids   . History of kidney stones   . Hyperlipidemia   . Hypertension   . Incontinence of urine   . MRSA infection greater than 3 months ago    2008 right side of face  . Nerve damage    right leg    Past Surgical History:  Procedure Laterality Date  . ABDOMINAL HYSTERECTOMY  1983  . BLADDER SUSPENSION  2014  . CHOLECYSTECTOMY    . EXCISION NEUROMA Bilateral 09/24/2014   Procedure: BILATERAL OPEN SAPHENOUS NEURECTOMIES AND OPEN LEFT PERIPATELLA OSETOPHYTECTOMY;  Surgeon: Mauri Pole, MD;  Location: [facility];  Service: Orthopedics;  Laterality: Bilateral;  . JOINT REPLACEMENT  2009/2010   BIL TOTAL KNEES  . TONSILLECTOMY      Prior to Admission medications   Medication Sig Start Date End Date  Taking? Authorizing Provider  amitriptyline (ELAVIL) 10 MG tablet Take 1 tablet (10 mg total) by mouth at bedtime. 09/25/16  Yes Megan P Johnson, DO  aspirin 81 MG tablet Take 81 mg by mouth daily.   Yes Historical Provider, MD  Aspirin-Acetaminophen-Caffeine (EXCEDRIN PO) Take by mouth. Pt not sure of dosage uses prn   Yes Historical Provider, MD  augmented betamethasone dipropionate (DIPROLENE-AF) 0.05 % cream APP EXT AA BID 09/01/16  Yes Historical Provider, MD  busPIRone (BUSPAR) 15 MG tablet Take 15 mg by mouth daily. 07/23/16  Yes Historical Provider, MD  doxepin (SINEQUAN) 50 MG capsule TK 1 OR 2 CS PO QHS PRN 01/23/16  Yes Historical Provider, MD  Fluticasone-Salmeterol (ADVAIR DISKUS) 250-50 MCG/DOSE AEPB Inhale 1 puff into the lungs 2 (two) times daily. 06/15/16  Yes Kathrine Haddock, NP  gabapentin (NEURONTIN) 300 MG capsule Take 300 mg by mouth 3 (three) times daily.   Yes Historical Provider, MD  hydrOXYzine (ATARAX/VISTARIL) 10 MG tablet  08/31/16  Yes Historical Provider, MD  ipratropium-albuterol (DUONEB) 0.5-2.5 (3) MG/3ML SOLN Take 3 mLs by nebulization every 4 (four) hours as needed. 03/03/16  Yes Kathrine Haddock, NP  LORazepam (ATIVAN) 0.5 MG tablet Take by mouth. 2 tablets at bedtime Reported on 02/26/2016  01/28/16  Yes Historical Provider, MD  Polyethyl Glycol-Propyl Glycol (SYSTANE) 0.4-0.3 % SOLN Apply to eye.   Yes Historical Provider, MD  sertraline (ZOLOFT) 100 MG tablet TK 2 TS PO QD 08/14/16  Yes Historical Provider, MD  tapentadol (NUCYNTA) 50 MG tablet Limit 1 tab by mouth 1 - 3 times per day if tolerated 06/24/16  Yes Mohammed Kindle, MD  tiZANidine (ZANAFLEX) 4 MG tablet TAKE 1 TABLET BY MOUTH THREE TIMES DAILY AS NEEDED. 08/14/16  Yes Kathrine Haddock, NP  topiramate (TOPAMAX) 100 MG tablet Take 1 tablet (100 mg total) by mouth 2 (two) times daily. 09/11/16  Yes Megan P Johnson, DO  traZODone (DESYREL) 150 MG tablet Take 150 mg by mouth daily. 07/23/16  Yes Historical Provider, MD     Family History  Problem Relation Age of Onset  . Asthma Mother   . Diabetes Mother   . Hyperlipidemia Mother   . Hypertension Mother   . Arthritis Father   . Cancer Father   . Hyperlipidemia Father   . Hypertension Father   . Kidney Stones Son   . Stroke Maternal Grandmother   . Cancer Maternal Grandfather     lung  . Pneumonia Paternal Grandmother   . Alzheimer's disease Paternal Grandfather   . Diabetes Sister   . Gout Sister   . Hypertension Sister   . Alcohol abuse Brother   . Heart disease Brother     massive MI     Social History  Substance Use Topics  . Smoking status: Former Smoker    Packs/day: 0.50    Types: Cigarettes    Quit date: 04/07/2016  . Smokeless tobacco: Never Used     Comment: patient has not smoked x 10 days.  . Alcohol use No    Allergies as of 11/11/2016 - Review Complete 11/11/2016  Allergen Reaction Noted  . Escitalopram  01/23/2015  . Lexapro [escitalopram oxalate] Other (See Comments) 09/07/2014    Review of Systems:    All systems reviewed and negative except where noted in HPI.   Physical Exam:  Ht '5\' 2"'$  (1.575 m)   Wt 216 lb (98 kg)   BMI 39.51 kg/m  No LMP recorded. Patient has had a hysterectomy. Psych:  Alert and cooperative. Normal mood and affect.Walks with a stick General:   Alert,  Well-developed, well-nourished, pleasant and cooperative in NAD Head:  Normocephalic and atraumatic. Eyes:  Sclera clear, no icterus.   Conjunctiva pink. Ears:  Normal auditory acuity. Nose:  No deformity, discharge, or lesions. Mouth:  No deformity or lesions,oropharynx pink & moist. Neck:  Supple; no masses or thyromegaly. Lungs:  Respirations even and unlabored.  Clear throughout to auscultation.   No wheezes, crackles, or rhonchi. No acute distress. Heart:  Regular rate and rhythm; no murmurs, clicks, rubs, or gallops. Abdomen:  Normal bowel sounds.  No bruits.  Soft, non-tender and non-distended without masses, hepatosplenomegaly  or hernias noted.  No guarding or rebound tenderness.    Neurologic:  Alert and oriented x3;  grossly normal neurologically.hy. Psych:  Alert and cooperative. Normal mood and affect.  Imaging Studies: No results found.  Assessment and Plan:   Susan Houston is a 56 y.o. y/o female here for evaluation of dysphagia.   Plan : 1. EGD+/- dilation  2. Omeprazole daily .   I have discussed alternative options, risks & benefits,  which include, but are not limited to, bleeding, infection, perforation,respiratory complication & drug reaction.  The patient agrees with this  plan & written consent will be obtained.     Follow up in 6 weeks   Dr Jonathon Bellows MD

## 2016-11-17 ENCOUNTER — Other Ambulatory Visit: Payer: Self-pay

## 2016-11-18 ENCOUNTER — Other Ambulatory Visit: Payer: Self-pay

## 2016-11-20 ENCOUNTER — Encounter
Admission: RE | Disposition: A | Discharge status: Home / Self Care | Payer: Self-pay | Source: Ambulatory Visit | Attending: Gastroenterology

## 2016-11-20 ENCOUNTER — Ambulatory Visit
Admission: RE | Admit: 2016-11-20 | Discharge: 2016-11-20 | Disposition: A | Discharge status: Home / Self Care | Payer: Medicare Other | Source: Ambulatory Visit | Attending: Gastroenterology | Admitting: Gastroenterology

## 2016-11-20 ENCOUNTER — Ambulatory Visit: Payer: Medicare Other | Admitting: Anesthesiology

## 2016-11-20 DIAGNOSIS — Z811 Family history of alcohol abuse and dependence: Secondary | ICD-10-CM | POA: Insufficient documentation

## 2016-11-20 DIAGNOSIS — Z825 Family history of asthma and other chronic lower respiratory diseases: Secondary | ICD-10-CM | POA: Diagnosis not present

## 2016-11-20 DIAGNOSIS — F419 Anxiety disorder, unspecified: Secondary | ICD-10-CM | POA: Diagnosis not present

## 2016-11-20 DIAGNOSIS — X58XXXA Exposure to other specified factors, initial encounter: Secondary | ICD-10-CM | POA: Diagnosis not present

## 2016-11-20 DIAGNOSIS — Z7982 Long term (current) use of aspirin: Secondary | ICD-10-CM | POA: Insufficient documentation

## 2016-11-20 DIAGNOSIS — Z820 Family history of epilepsy and other diseases of the nervous system: Secondary | ICD-10-CM | POA: Insufficient documentation

## 2016-11-20 DIAGNOSIS — K2970 Gastritis, unspecified, without bleeding: Secondary | ICD-10-CM | POA: Diagnosis not present

## 2016-11-20 DIAGNOSIS — Z8249 Family history of ischemic heart disease and other diseases of the circulatory system: Secondary | ICD-10-CM | POA: Insufficient documentation

## 2016-11-20 DIAGNOSIS — M79604 Pain in right leg: Secondary | ICD-10-CM | POA: Diagnosis not present

## 2016-11-20 DIAGNOSIS — K2960 Other gastritis without bleeding: Secondary | ICD-10-CM | POA: Diagnosis not present

## 2016-11-20 DIAGNOSIS — J449 Chronic obstructive pulmonary disease, unspecified: Secondary | ICD-10-CM | POA: Insufficient documentation

## 2016-11-20 DIAGNOSIS — Z8614 Personal history of Methicillin resistant Staphylococcus aureus infection: Secondary | ICD-10-CM | POA: Insufficient documentation

## 2016-11-20 DIAGNOSIS — Z7951 Long term (current) use of inhaled steroids: Secondary | ICD-10-CM | POA: Insufficient documentation

## 2016-11-20 DIAGNOSIS — Z833 Family history of diabetes mellitus: Secondary | ICD-10-CM | POA: Diagnosis not present

## 2016-11-20 DIAGNOSIS — I10 Essential (primary) hypertension: Secondary | ICD-10-CM | POA: Diagnosis not present

## 2016-11-20 DIAGNOSIS — M79605 Pain in left leg: Secondary | ICD-10-CM | POA: Insufficient documentation

## 2016-11-20 DIAGNOSIS — Z888 Allergy status to other drugs, medicaments and biological substances status: Secondary | ICD-10-CM | POA: Insufficient documentation

## 2016-11-20 DIAGNOSIS — Z87442 Personal history of urinary calculi: Secondary | ICD-10-CM | POA: Diagnosis not present

## 2016-11-20 DIAGNOSIS — K259 Gastric ulcer, unspecified as acute or chronic, without hemorrhage or perforation: Secondary | ICD-10-CM

## 2016-11-20 DIAGNOSIS — R1310 Dysphagia, unspecified: Secondary | ICD-10-CM | POA: Diagnosis not present

## 2016-11-20 DIAGNOSIS — Z8261 Family history of arthritis: Secondary | ICD-10-CM | POA: Insufficient documentation

## 2016-11-20 DIAGNOSIS — G8929 Other chronic pain: Secondary | ICD-10-CM | POA: Insufficient documentation

## 2016-11-20 DIAGNOSIS — Z8521 Personal history of malignant neoplasm of larynx: Secondary | ICD-10-CM | POA: Diagnosis not present

## 2016-11-20 DIAGNOSIS — Z87891 Personal history of nicotine dependence: Secondary | ICD-10-CM | POA: Insufficient documentation

## 2016-11-20 DIAGNOSIS — E785 Hyperlipidemia, unspecified: Secondary | ICD-10-CM | POA: Insufficient documentation

## 2016-11-20 DIAGNOSIS — T18128A Food in esophagus causing other injury, initial encounter: Secondary | ICD-10-CM | POA: Insufficient documentation

## 2016-11-20 DIAGNOSIS — Z841 Family history of disorders of kidney and ureter: Secondary | ICD-10-CM | POA: Insufficient documentation

## 2016-11-20 DIAGNOSIS — Z9221 Personal history of antineoplastic chemotherapy: Secondary | ICD-10-CM | POA: Insufficient documentation

## 2016-11-20 DIAGNOSIS — Z8349 Family history of other endocrine, nutritional and metabolic diseases: Secondary | ICD-10-CM | POA: Insufficient documentation

## 2016-11-20 DIAGNOSIS — F329 Major depressive disorder, single episode, unspecified: Secondary | ICD-10-CM | POA: Diagnosis not present

## 2016-11-20 DIAGNOSIS — Z79899 Other long term (current) drug therapy: Secondary | ICD-10-CM | POA: Diagnosis not present

## 2016-11-20 DIAGNOSIS — K2900 Acute gastritis without bleeding: Secondary | ICD-10-CM | POA: Diagnosis not present

## 2016-11-20 HISTORY — PX: ESOPHAGOGASTRODUODENOSCOPY (EGD) WITH PROPOFOL: SHX5813

## 2016-11-20 SURGERY — ESOPHAGOGASTRODUODENOSCOPY (EGD) WITH PROPOFOL
Anesthesia: General

## 2016-11-20 MED ORDER — LIDOCAINE HCL (CARDIAC) 20 MG/ML IV SOLN
INTRAVENOUS | Status: DC | PRN
  Administered 2016-11-20: 30 mg via INTRAVENOUS

## 2016-11-20 MED ORDER — GLYCOPYRROLATE 0.2 MG/ML IJ SOLN
INTRAMUSCULAR | Status: AC
  Filled 2016-11-20: qty 1

## 2016-11-20 MED ORDER — FENTANYL CITRATE (PF) 100 MCG/2ML IJ SOLN
INTRAMUSCULAR | Status: DC | PRN
  Administered 2016-11-20: 50 ug via INTRAVENOUS

## 2016-11-20 MED ORDER — SODIUM CHLORIDE 0.9 % IV SOLN
INTRAVENOUS | Status: DC
  Administered 2016-11-20: 10:00:00 via INTRAVENOUS

## 2016-11-20 MED ORDER — FENTANYL CITRATE (PF) 100 MCG/2ML IJ SOLN
INTRAMUSCULAR | Status: AC
  Filled 2016-11-20: qty 2

## 2016-11-20 MED ORDER — LIDOCAINE HCL (PF) 2 % IJ SOLN
INTRAMUSCULAR | Status: AC
  Filled 2016-11-20: qty 2

## 2016-11-20 MED ORDER — PROPOFOL 500 MG/50ML IV EMUL
INTRAVENOUS | Status: DC | PRN
  Administered 2016-11-20: 120 ug/kg/min via INTRAVENOUS

## 2016-11-20 MED ORDER — MIDAZOLAM HCL 2 MG/2ML IJ SOLN
INTRAMUSCULAR | Status: AC
  Filled 2016-11-20: qty 2

## 2016-11-20 MED ORDER — PROPOFOL 500 MG/50ML IV EMUL
INTRAVENOUS | Status: AC
  Filled 2016-11-20: qty 50

## 2016-11-20 MED ORDER — GLYCOPYRROLATE 0.2 MG/ML IJ SOLN
INTRAMUSCULAR | Status: DC | PRN
  Administered 2016-11-20: 0.1 mg via INTRAVENOUS

## 2016-11-20 MED ORDER — MIDAZOLAM HCL 2 MG/2ML IJ SOLN
INTRAMUSCULAR | Status: DC | PRN
  Administered 2016-11-20: 2 mg via INTRAVENOUS

## 2016-11-20 NOTE — H&P (Signed)
Jonathon Bellows MD [address], [place], [place] [address] Phone: [phone] Fax : [phone]  Primary Care Physician:  Park Liter, DO Primary Gastroenterologist:  Dr. Jonathon Bellows   Pre-Procedure History & Physical: HPI:  Susan Houston is a 56 y.o. female is here for an endoscopy and dilation .   Past Medical History:  Diagnosis Date  . Anxiety   . Arthritis   . Asthma   . Back pain   . Bruises easily   . Cancer ([place]) 2009   LARYNX CANCER - CHEMO / RADIATION (NO SURGERY)   . Chronic leg pain    BILATERAL  . COPD (chronic obstructive pulmonary disease) ([place])   . Depression   . Difficulty sleeping   . Diverticulitis   . Hemorrhoids   . History of kidney stones   . Hyperlipidemia   . Hypertension   . Incontinence of urine   . MRSA infection greater than 3 months ago    2008 right side of face  . Nerve damage    right leg    Past Surgical History:  Procedure Laterality Date  . ABDOMINAL HYSTERECTOMY  1983  . BLADDER SUSPENSION  2014  . CHOLECYSTECTOMY    . COLONOSCOPY    . ESOPHAGOGASTRODUODENOSCOPY    . EXCISION NEUROMA Bilateral 09/24/2014   Procedure: BILATERAL OPEN SAPHENOUS NEURECTOMIES AND OPEN LEFT PERIPATELLA OSETOPHYTECTOMY;  Surgeon: Mauri Pole, MD;  Location: [facility];  Service: Orthopedics;  Laterality: Bilateral;  . JOINT REPLACEMENT  2009/2010   BIL TOTAL KNEES  . TONSILLECTOMY      Prior to Admission medications   Medication Sig Start Date End Date Taking? Authorizing Provider  amitriptyline (ELAVIL) 10 MG tablet Take 1 tablet (10 mg total) by mouth at bedtime. 09/25/16   Megan P Johnson, DO  aspirin 81 MG tablet Take 81 mg by mouth daily.    Historical Provider, MD  Aspirin-Acetaminophen-Caffeine (EXCEDRIN PO) Take by mouth. Pt not sure of dosage uses prn    Historical Provider, MD  augmented betamethasone dipropionate (DIPROLENE-AF) 0.05 % cream APP EXT AA BID 09/01/16   Historical Provider, MD  busPIRone (BUSPAR) 15 MG tablet Take 15  mg by mouth daily. 07/23/16   Historical Provider, MD  doxepin (SINEQUAN) 50 MG capsule TK 1 OR 2 CS PO QHS PRN 01/23/16   Historical Provider, MD  Fluticasone-Salmeterol (ADVAIR DISKUS) 250-50 MCG/DOSE AEPB Inhale 1 puff into the lungs 2 (two) times daily. 06/15/16   Kathrine Haddock, NP  gabapentin (NEURONTIN) 300 MG capsule Take 300 mg by mouth 3 (three) times daily.    Historical Provider, MD  hydrOXYzine (ATARAX/VISTARIL) 10 MG tablet  08/31/16   Historical Provider, MD  ipratropium-albuterol (DUONEB) 0.5-2.5 (3) MG/3ML SOLN Take 3 mLs by nebulization every 4 (four) hours as needed. 03/03/16   Kathrine Haddock, NP  LORazepam (ATIVAN) 0.5 MG tablet Take by mouth. 2 tablets at bedtime Reported on 02/26/2016 01/28/16   Historical Provider, MD  omeprazole (PRILOSEC) 40 MG capsule Take 1 capsule (40 mg total) by mouth daily. 11/11/16 02/09/17  Jonathon Bellows, MD  Polyethyl Glycol-Propyl Glycol (SYSTANE) 0.4-0.3 % SOLN Apply to eye.    Historical Provider, MD  sertraline (ZOLOFT) 100 MG tablet TK 2 TS PO QD 08/14/16   Historical Provider, MD  tapentadol (NUCYNTA) 50 MG tablet Limit 1 tab by mouth 1 - 3 times per day if tolerated 06/24/16   Mohammed Kindle, MD  tiZANidine (ZANAFLEX) 4 MG tablet TAKE 1 TABLET BY MOUTH THREE TIMES  DAILY AS NEEDED. 08/14/16   Kathrine Haddock, NP  topiramate (TOPAMAX) 100 MG tablet Take 1 tablet (100 mg total) by mouth 2 (two) times daily. 09/11/16   Megan P Johnson, DO  traZODone (DESYREL) 150 MG tablet Take 150 mg by mouth daily. 07/23/16   Historical Provider, MD    Allergies as of 11/17/2016 - Review Complete 11/11/2016  Allergen Reaction Noted  . Escitalopram  01/23/2015  . Lexapro [escitalopram oxalate] Other (See Comments) 09/07/2014    Family History  Problem Relation Age of Onset  . Asthma Mother   . Diabetes Mother   . Hyperlipidemia Mother   . Hypertension Mother   . Arthritis Father   . Cancer Father   . Hyperlipidemia Father   . Hypertension Father   . Kidney Stones  Son   . Stroke Maternal Grandmother   . Cancer Maternal Grandfather     lung  . Pneumonia Paternal Grandmother   . Alzheimer's disease Paternal Grandfather   . Diabetes Sister   . Gout Sister   . Hypertension Sister   . Alcohol abuse Brother   . Heart disease Brother     massive MI    Social History   Social History  . Marital status: Married    Spouse name: N/A  . Number of children: N/A  . Years of education: N/A   Occupational History  . Not on file.   Social History Main Topics  . Smoking status: Former Smoker    Packs/day: 0.50    Types: Cigarettes    Quit date: 04/07/2016  . Smokeless tobacco: Never Used     Comment: patient has not smoked x 10 days.  . Alcohol use No  . Drug use: No  . Sexual activity: No   Other Topics Concern  . Not on file   Social History Narrative  . No narrative on file    Review of Systems: See HPI, otherwise negative ROS  Physical Exam: BP 130/73   Pulse 68   Temp 97.5 F (36.4 C) (Tympanic)   Resp 18   Ht '5\' 2"'$  (1.575 m)   Wt 216 lb (98 kg)   SpO2 100%   BMI 39.51 kg/m  General:   Alert,  pleasant and cooperative in NAD Head:  Normocephalic and atraumatic. Neck:  Supple; no masses or thyromegaly. Lungs:  Clear throughout to auscultation.    Heart:  Regular rate and rhythm. Abdomen:  Soft, nontender and nondistended. Normal bowel sounds, without guarding, and without rebound.   Neurologic:  Alert and  oriented x4;  grossly normal neurologically.  Impression/Plan: Susan Houston is here for an endoscopy and dilation  to be performed for dysphagia  Risks, benefits, limitations, and alternatives regarding  endoscopy and dilation  have been reviewed with the patient.  Questions have been answered.  All parties agreeable.   Jonathon Bellows, MD  11/20/2016, 10:29 AM

## 2016-11-20 NOTE — Anesthesia Postprocedure Evaluation (Signed)
Anesthesia Post Note  Patient: Susan Houston  Procedure(s) Performed: Procedure(s) (LRB): ESOPHAGOGASTRODUODENOSCOPY (EGD) WITH PROPOFOL (N/A)  Patient location during evaluation: Endoscopy Anesthesia Type: General Level of consciousness: awake and alert and oriented Pain management: pain level controlled Vital Signs Assessment: post-procedure vital signs reviewed and stable Respiratory status: spontaneous breathing, nonlabored ventilation and respiratory function stable Cardiovascular status: blood pressure returned to baseline and stable Postop Assessment: no signs of nausea or vomiting Anesthetic complications: no     Last Vitals:  Vitals:   11/20/16 1124 11/20/16 1134  BP: 109/72 126/75  Pulse: 74 75  Resp: 17   Temp: 36.2 C     Last Pain:  Vitals:   11/20/16 1124  TempSrc: Tympanic                 Ediberto Sens

## 2016-11-20 NOTE — Anesthesia Procedure Notes (Signed)
Performed by: COOK-MARTIN, Luisalberto Beegle Pre-anesthesia Checklist: Patient identified, Emergency Drugs available, Suction available, Patient being monitored and Timeout performed Patient Re-evaluated:Patient Re-evaluated prior to inductionOxygen Delivery Method: Nasal cannula Preoxygenation: Pre-oxygenation with 100% oxygen Intubation Type: IV induction Airway Equipment and Method: Bite block Placement Confirmation: CO2 detector and positive ETCO2     

## 2016-11-20 NOTE — Transfer of Care (Signed)
Immediate Anesthesia Transfer of Care Note  Patient: Susan Houston  Procedure(s) Performed: Procedure(s): ESOPHAGOGASTRODUODENOSCOPY (EGD) WITH PROPOFOL (N/A)  Patient Location: PACU  Anesthesia Type:General  Level of Consciousness: awake, alert  and sedated  Airway & Oxygen Therapy: Patient Spontanous Breathing and Patient connected to nasal cannula oxygen  Post-op Assessment: Report given to RN and Post -op Vital signs reviewed and stable  Post vital signs: Reviewed and stable  Last Vitals:  Vitals:   11/20/16 1006  BP: 130/73  Pulse: 68  Resp: 18  Temp: 36.4 C    Last Pain:  Vitals:   11/20/16 1006  TempSrc: Tympanic         Complications: No apparent anesthesia complications

## 2016-11-20 NOTE — Anesthesia Post-op Follow-up Note (Cosign Needed)
Anesthesia QCDR form completed.        

## 2016-11-20 NOTE — Anesthesia Preprocedure Evaluation (Signed)
Anesthesia Evaluation  Patient identified by MRN, date of birth, ID band Patient awake    Reviewed: Allergy & Precautions, NPO status , Patient's Chart, lab work & pertinent test results  History of Anesthesia Complications Negative for: history of anesthetic complications  Airway Mallampati: II  TM Distance: >3 FB Neck ROM: Full    Dental  (+) Edentulous Upper, Edentulous Lower   Pulmonary neg sleep apnea, COPD,  COPD inhaler, former smoker,    breath sounds clear to auscultation- rhonchi (-) wheezing      Cardiovascular hypertension, Pt. on medications (-) CAD and (-) Past MI  Rhythm:Regular Rate:Normal - Systolic murmurs and - Diastolic murmurs    Neuro/Psych  Headaches, PSYCHIATRIC DISORDERS Anxiety Depression    GI/Hepatic negative GI ROS, Neg liver ROS,   Endo/Other  negative endocrine ROSneg diabetes  Renal/GU negative Renal ROS     Musculoskeletal  (+) Arthritis ,   Abdominal (+) + obese,   Peds  Hematology negative hematology ROS (+)   Anesthesia Other Findings Past Medical History: No date: Anxiety No date: Arthritis No date: Asthma No date: Back pain No date: Bruises easily 2009: Cancer ([place])     Comment: LARYNX CANCER - CHEMO / RADIATION (NO SURGERY) No date: Chronic leg pain     Comment: BILATERAL No date: COPD (chronic obstructive pulmonary disease) (* No date: Depression No date: Difficulty sleeping No date: Diverticulitis No date: Hemorrhoids No date: History of kidney stones No date: Hyperlipidemia No date: Hypertension No date: Incontinence of urine No date: MRSA infection greater than 3 months ago     Comment: 2008 right side of face No date: Nerve damage     Comment: right leg   Reproductive/Obstetrics                             Anesthesia Physical Anesthesia Plan  ASA: III  Anesthesia Plan: General   Post-op Pain Management:    Induction:  Intravenous  Airway Management Planned: Natural Airway  Additional Equipment:   Intra-op Plan:   Post-operative Plan:   Informed Consent: I have reviewed the patients History and Physical, chart, labs and discussed the procedure including the risks, benefits and alternatives for the proposed anesthesia with the patient or authorized representative who has indicated his/her understanding and acceptance.   Dental advisory given  Plan Discussed with: CRNA and Anesthesiologist  Anesthesia Plan Comments:         Anesthesia Quick Evaluation

## 2016-11-20 NOTE — Op Note (Signed)
[facility] Gastroenterology Patient Name: Susan Houston Procedure Date: 11/20/2016 10:55 AM MRN: [id number] Account #: [id number] Date of Birth: 1961-05-23 Admit Type: Outpatient Age: 56 Room: [facility] ENDO ROOM 4 Gender: Female Note Status: Finalized Procedure:            Upper GI endoscopy Indications:          Dysphagia Providers:            Jonathon Bellows MD, MD Referring MD:         Valerie Roys (Referring MD) Medicines:            Monitored Anesthesia Care Complications:        No immediate complications. Procedure:            Pre-Anesthesia Assessment:                       - Prior to the procedure, a History and Physical was                        performed, and patient medications, allergies and                        sensitivities were reviewed. The patient's tolerance of                        previous anesthesia was reviewed.                       - The risks and benefits of the procedure and the                        sedation options and risks were discussed with the                        patient. All questions were answered and informed                        consent was obtained.                       - The risks and benefits of the procedure and the                        sedation options and risks were discussed with the                        patient. All questions were answered and informed                        consent was obtained.                       - ASA Grade Assessment: III - A patient with severe                        systemic disease.                       After obtaining informed consent, the endoscope was  passed under direct vision. Throughout the procedure,                        the patient's blood pressure, pulse, and oxygen                        saturations were monitored continuously. The                        Colonoscope was introduced through the mouth, and                        advanced to the third  part of duodenum. The upper GI                        endoscopy was accomplished with ease. The patient                        tolerated the procedure well. Findings:      The examined duodenum was normal.      Diffuse moderate inflammation characterized by congestion (edema),       erosions and erythema was found in the gastric antrum. Biopsies were       taken with a cold forceps for histology.      One non-bleeding linear gastric ulcer with a clean ulcer base (Forrest       Class III) was found at the incisura. The lesion was 7 mm in largest       dimension. Biopsies were taken with a cold forceps for histology.      Two non-bleeding superficial gastric ulcers with no stigmata of bleeding       were found in the stomach. The largest lesion was 5 mm in largest       dimension. Biopsies were taken with a cold forceps for histology.      Food was found in the upper third of the esophagus. fragments seen      The examined esophagus was normal. Biopsies were taken with a cold       forceps for histology. Impression:           - Normal examined duodenum.                       - Gastritis. Biopsied.                       - Non-bleeding gastric ulcer with a clean ulcer base                        (Forrest Class III). Biopsied.                       - Non-bleeding gastric ulcers with no stigmata of                        bleeding. Biopsied.                       - Food in the upper third of the esophagus.                       - Normal esophagus. Biopsied. Recommendation:       -  Discharge patient to home (with escort).                       - Resume previous diet.                       - Stop any NSAIDs                       - Continue omerpazole                       - Return to my office in 3 weeks.                       - Since we found no strictures and she had fragments of                        food in the esophagus, she may have esophageal                        dysmotility and  would benefit from esophageal manometry                        if her symptoms persist Procedure Code(s):    --- Professional ---                       [phone], Esophagogastroduodenoscopy, flexible, transoral;                        with biopsy, single or multiple Diagnosis Code(s):    --- Professional ---                       K29.70, Gastritis, unspecified, without bleeding                       K25.9, Gastric ulcer, unspecified as acute or chronic,                        without hemorrhage or perforation                       E08.144Y, Food in esophagus causing other injury,                        initial encounter                       R13.10, Dysphagia, unspecified CPT copyright 2016 American Medical Association. All rights reserved. The codes documented in this report are preliminary and upon coder review may  be revised to meet current compliance requirements. Jonathon Bellows, MD Jonathon Bellows MD, MD 11/20/2016 11:22:10 AM This report has been signed electronically. Number of Addenda: 0 Note Initiated On: 11/20/2016 10:55 AM      [facility]

## 2016-11-23 ENCOUNTER — Encounter: Payer: Self-pay | Admitting: Gastroenterology

## 2016-11-23 LAB — SURGICAL PATHOLOGY

## 2016-11-24 ENCOUNTER — Other Ambulatory Visit: Payer: Self-pay | Admitting: Family Medicine

## 2016-11-24 ENCOUNTER — Other Ambulatory Visit: Payer: Self-pay | Admitting: Unknown Physician Specialty

## 2016-11-27 ENCOUNTER — Telehealth: Payer: Self-pay | Admitting: Family Medicine

## 2016-11-27 NOTE — Telephone Encounter (Signed)
Will need an appointment. Thanks!

## 2016-11-27 NOTE — Telephone Encounter (Signed)
Called patient.  Red rash. Itches. Not spreading. On stomach. Tiny bumps on it. Not splotchy, just one rash.   Patient had ring worm under her left breast a few days ago. Used husbands jock itch and it went away.

## 2016-11-30 ENCOUNTER — Ambulatory Visit (INDEPENDENT_AMBULATORY_CARE_PROVIDER_SITE_OTHER): Payer: Medicare Other | Admitting: Family Medicine

## 2016-11-30 ENCOUNTER — Encounter: Payer: Self-pay | Admitting: Family Medicine

## 2016-11-30 VITALS — BP 96/68 | HR 67 | Temp 98.1°F | Wt 222.0 lb

## 2016-11-30 DIAGNOSIS — L509 Urticaria, unspecified: Secondary | ICD-10-CM | POA: Diagnosis not present

## 2016-11-30 MED ORDER — HYDROXYZINE HCL 25 MG PO TABS: 25.0000 mg | ORAL_TABLET | Freq: Three times a day (TID) | ORAL | 0 refills | Status: DC | PRN

## 2016-11-30 MED ORDER — CLOBETASOL PROPIONATE 0.05 % EX CREA: 1.0000 "application " | TOPICAL_CREAM | Freq: Two times a day (BID) | CUTANEOUS | 0 refills | Status: DC

## 2016-11-30 MED ORDER — PREDNISONE 10 MG PO TABS: ORAL_TABLET | ORAL | 0 refills | Status: DC

## 2016-11-30 NOTE — Patient Instructions (Signed)
Follow up as scheduled.  

## 2016-11-30 NOTE — Progress Notes (Signed)
   BP 96/68   Pulse 67   Temp 98.1 F (36.7 C)   Wt 222 lb (100.7 kg)   SpO2 98%   BMI 40.60 kg/m    Subjective:    Patient ID: Susan Houston, female    DOB: October 25, 1960, 56 y.o.   MRN: [id number]  HPI: Susan Houston is a 56 y.o. female  Chief Complaint  Patient presents with  . Rash    x 1 week, on her abdomen, itches, red. Not spreading. No changes in detergants or soaps.   Patient presents with 1 week of diffuse intensely pruritic rash across abdomen. No stinging, burning, blistering. Denies new foods, medications, soaps/lotions. Putting cortisone cream on it faithfully with no relief.   Relevant past medical, surgical, family and social history reviewed and updated as indicated. Interim medical history since our last visit reviewed. Allergies and medications reviewed and updated.  Review of Systems  Constitutional: Negative.   HENT: Negative.   Respiratory: Negative.   Cardiovascular: Negative.   Gastrointestinal: Negative.   Genitourinary: Negative.   Musculoskeletal: Negative.   Skin: Positive for rash.  Neurological: Negative.   Psychiatric/Behavioral: Negative.     Per HPI unless specifically indicated above     Objective:    BP 96/68   Pulse 67   Temp 98.1 F (36.7 C)   Wt 222 lb (100.7 kg)   SpO2 98%   BMI 40.60 kg/m   Wt Readings from Last 3 Encounters:  11/30/16 222 lb (100.7 kg)  11/20/16 216 lb (98 kg)  11/11/16 216 lb (98 kg)    Physical Exam  Constitutional: She is oriented to person, place, and time. She appears well-developed and well-nourished. No distress.  HENT:  Head: Atraumatic.  Eyes: Conjunctivae are normal. Pupils are equal, round, and reactive to light.  Neck: Normal range of motion. Neck supple.  Pulmonary/Chest: Effort normal. No respiratory distress.  Musculoskeletal: Normal range of motion.  Neurological: She is alert and oriented to person, place, and time.  Skin: Skin is warm and dry. Rash (diffuse erythematous maculopapular  rash across entire abdomen) noted.  Psychiatric: She has a normal mood and affect. Her behavior is normal.  Nursing note and vitals reviewed.     Assessment & Plan:   Problem List Items Addressed This Visit    None    Visit Diagnoses    Urticaria    -  Primary   Will treat with prednisone, hydroxyzine, and clobetasol cream. Risks and benefits of medications reviewed. F/u if no improvement over next several days       Follow up plan: Return for as scheduled.

## 2016-12-03 ENCOUNTER — Telehealth: Payer: Self-pay

## 2016-12-03 NOTE — Telephone Encounter (Signed)
Pt notified of EGD results.  

## 2016-12-03 NOTE — Telephone Encounter (Signed)
-----   Message from Jonathon Bellows, MD sent at 12/02/2016 12:31 PM EST ----- Esophageal bx normal- suggest follow up as outpatient

## 2016-12-15 ENCOUNTER — Telehealth: Payer: Self-pay | Admitting: *Deleted

## 2016-12-15 ENCOUNTER — Telehealth: Payer: Self-pay | Admitting: Unknown Physician Specialty

## 2016-12-15 ENCOUNTER — Telehealth: Payer: Self-pay | Admitting: Family Medicine

## 2016-12-15 DIAGNOSIS — G252 Other specified forms of tremor: Secondary | ICD-10-CM

## 2016-12-15 DIAGNOSIS — G43009 Migraine without aura, not intractable, without status migrainosus: Secondary | ICD-10-CM

## 2016-12-15 NOTE — Telephone Encounter (Signed)
General Referral directed to [facility].  Patient notified that it was sent, they should be contacting her soon.

## 2016-12-15 NOTE — Telephone Encounter (Signed)
Left message on patients answering system that the referral has been put in and she should get a call in a few days to set up appointment.

## 2016-12-15 NOTE — Telephone Encounter (Signed)
Routing to provider  

## 2016-12-15 NOTE — Telephone Encounter (Signed)
Patient would like a referral to [facility].  Patient states that she has already contacted the office and simply needs a referral before they will schedule her an appointment.  Please call patient at [phone] with questions.

## 2016-12-15 NOTE — Telephone Encounter (Signed)
Patient called to check on the referral to [facility].   [phone]  Please advise  Thanks

## 2016-12-15 NOTE — Telephone Encounter (Signed)
All set!

## 2016-12-17 DIAGNOSIS — Z79899 Other long term (current) drug therapy: Secondary | ICD-10-CM | POA: Diagnosis not present

## 2016-12-21 NOTE — Progress Notes (Signed)
This encounter was created in error - please disregard.

## 2016-12-25 ENCOUNTER — Other Ambulatory Visit: Payer: Self-pay | Admitting: Family Medicine

## 2016-12-25 DIAGNOSIS — K219 Gastro-esophageal reflux disease without esophagitis: Secondary | ICD-10-CM | POA: Diagnosis not present

## 2016-12-25 DIAGNOSIS — R05 Cough: Secondary | ICD-10-CM | POA: Diagnosis not present

## 2016-12-25 DIAGNOSIS — J019 Acute sinusitis, unspecified: Secondary | ICD-10-CM | POA: Diagnosis not present

## 2016-12-25 DIAGNOSIS — R070 Pain in throat: Secondary | ICD-10-CM | POA: Diagnosis not present

## 2016-12-29 ENCOUNTER — Telehealth: Payer: Self-pay | Admitting: Family Medicine

## 2016-12-29 NOTE — Telephone Encounter (Signed)
Pt has received a [employer] but states that she is unable to serve due to health reasons.  Patient wants to know if Dr. Wynetta Emery will write a letter to excuse the patient from having to serve. Patient states that Dr. Primus Bravo used to write the excuse for her but she is no longer seeing him.  Please call patient to advise.

## 2016-12-29 NOTE — Telephone Encounter (Signed)
Patient dropped off form for Dr Wynetta Emery to fill out regarding her Susan Houston Duty notice.  She will pick up when completed.  Thanks

## 2016-12-29 NOTE — Telephone Encounter (Signed)
If she drops off the form I can fill it out.

## 2016-12-29 NOTE — Telephone Encounter (Signed)
Please advise 

## 2016-12-29 NOTE — Telephone Encounter (Signed)
Pt will drop off form.

## 2016-12-30 ENCOUNTER — Encounter: Payer: Self-pay | Admitting: Family Medicine

## 2016-12-30 NOTE — Telephone Encounter (Signed)
Form is ready for patient to pick up.  Called left message for patient to call back.

## 2016-12-30 NOTE — Telephone Encounter (Signed)
Form given to Dr. Wynetta Emery to fill out.

## 2016-12-31 NOTE — Progress Notes (Signed)
[facility]    Provider:  Dr Jaynee Eagles Referring Provider: Valerie Roys, DO Primary Care Physician:  Park Liter, DO  CC:  Chronic migraine  HPI:  Susan Houston is a 56 y.o. female here as a referral from Dr. Wynetta Emery for migaines. Migraines started in her teen years. She feels like the top of her head has pressure, like her head is being blown off, back of the head throbbing. She stopped taking excedrin. No medication overuse. She has been on medications for years for "mental conditions". She has headaches 2-3x a week. They usually last 8-9 hours. Up to 2-3 days. Out of 30 days she has 15-16 headache days a month and all are migraines for the last year. She has light and sound sensitivity, she has to go into the bedroom and close the blinds. Ongoing for years without change in quality. No medication overuse. Worsened over the last year in frequency. She does not get an aura. She can't stand sound, endorses nausea and vomiting. No other focal neurologic deficits, associated symptoms, inciting events or modifiable factors.  Medications tried: Amitriptyline '10mg'$ , gabapentin, sertraline, tizanidine, topamax, imitrex, prednisone, zofran  Reviewed notes, labs and imaging from outside physicians, which showed:  Personally reviewed images and agree with the following:  Brain: There is no evidence of acute infarct, intracranial hemorrhage, mass, midline shift, or extra-axial fluid collection. The ventricles and sulci are normal. Small foci of T2 hyperintensity in the deep cerebral white matter bilaterally are mild for age.  Vascular: Major intracranial vascular flow voids are preserved.  Skull and upper cervical spine: Unremarkable bone marrow signal.  Sinuses/Orbits: Unremarkable orbits. Paranasal sinuses and mastoid air cells are clear.  Other: None.  IMPRESSION: 1. No acute intracranial abnormality or mass. 2. Mild cerebral white matter T2 signal changes,  nonspecific but may reflect chronic small vessel ischemic disease or migraines.  Reviewed labs, CMP unremarkable with nml bun/cr (17/0.83) 06/2016 nml LFTs  Review of Systems: Patient complains of symptoms per HPI as well as the following symptoms: no CP, no SOB. Pertinent negatives per HPI. All others negative.   Social History   Social History  . Marital status: Married    Spouse name: Tharon Aquas  . Number of children: 2  . Years of education: 12   Occupational History  . Disabled    Social History Main Topics  . Smoking status: Former Smoker    Packs/day: 0.50    Types: Cigarettes    Quit date: 04/07/2016  . Smokeless tobacco: Never Used     Comment: patient has not smoked x 10 days.  . Alcohol use No  . Drug use: No  . Sexual activity: No   Other Topics Concern  . Not on file   Social History Narrative   Lives at home w/ her husband   Right-handed   Caffeine: occasionally    Family History  Problem Relation Age of Onset  . Asthma Mother   . Diabetes Mother   . Hyperlipidemia Mother   . Hypertension Mother   . Cirrhosis Mother     Non-alcoholic  . Arthritis Father   . Cancer Father     Bone  . Hyperlipidemia Father   . Hypertension Father   . Kidney Stones Son   . Stroke Maternal Grandmother   . Cancer Maternal Grandfather     lung  . Pneumonia Paternal Grandmother   . Alzheimer's disease Paternal Grandfather   . Diabetes Sister   . Gout Sister   .  Hypertension Sister   . Alcohol abuse Brother   . Heart disease Brother     massive MI    Past Medical History:  Diagnosis Date  . Anxiety   . Arthritis   . Asthma   . Back pain   . Bruises easily   . 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 Chronic leg pain    BILATERAL  . COPD (chronic obstructive pulmonary disease) ([place])   . Depression   . Difficulty sleeping   . Diverticulitis   . Hemorrhoids   . History of kidney stones   . Hyperlipidemia   . Hypertension    . Incontinence of urine   . Migraine   . MRSA infection greater than 3 months ago    2008 right side of face  . Nerve damage    right leg    Past Surgical History:  Procedure Laterality Date  . ABDOMINAL HYSTERECTOMY  1983  . BLADDER SUSPENSION  2014  . CHOLECYSTECTOMY    . COLONOSCOPY    . ESOPHAGOGASTRODUODENOSCOPY    . ESOPHAGOGASTRODUODENOSCOPY (EGD) WITH PROPOFOL N/A 11/20/2016   Procedure: ESOPHAGOGASTRODUODENOSCOPY (EGD) WITH PROPOFOL;  Surgeon: Jonathon Bellows, MD;  Location: [facility] END[facility]COPY;  Service: Endoscopy;  Laterality: N/A;  . EXCISION NEUROMA Bilateral 09/24/2014   Procedure: BILATERAL OPEN SAPHENOUS NEURECTOMIES AND OPEN LEFT PERIPATELLA OSETOPHYTECTOMY;  Surgeon: Mauri Pole, MD;  Location: [facility];  Service: Orthopedics;  Laterality: Bilateral;  . JOINT REPLACEMENT  2009/2010   BIL TOTAL KNEES  . TONSILLECTOMY      Current Outpatient Prescriptions  Medication Sig Dispense Refill  . aspirin 81 MG tablet Take 81 mg by mouth daily.    . busPIRone (BUSPAR) 15 MG tablet Take 15 mg by mouth daily.    Marland Kitchen doxepin (SINEQUAN) 50 MG capsule TK 1 OR 2 CS PO QHS PRN  4  . Fluticasone-Salmeterol (ADVAIR DISKUS) 250-50 MCG/DOSE AEPB Inhale 1 puff into the lungs 2 (two) times daily. 60 each 12  . gabapentin (NEURONTIN) 300 MG capsule Take 300 mg by mouth 3 (three) times daily.    Marland Kitchen ipratropium-albuterol (DUONEB) 0.5-2.5 (3) MG/3ML SOLN Take 3 mLs by nebulization every 4 (four) hours as needed. 780 mL 3  . LORazepam (ATIVAN) 0.5 MG tablet Take by mouth. 2 tablets at bedtime Reported on 02/26/2016    . omeprazole (PRILOSEC) 40 MG capsule Take 1 capsule (40 mg total) by mouth daily. 90 capsule 0  . sertraline (ZOLOFT) 100 MG tablet TK 2 TS PO QD  4  . tiZANidine (ZANAFLEX) 4 MG tablet TAKE 1 TABLET BY MOUTH THREE TIMES DAILY AS NEEDED. 270 tablet 0  . topiramate (TOPAMAX) 100 MG tablet Take 1 tablet (100 mg total) by mouth 2 (two) times daily. 60 tablet 3  . traZODone (DESYREL) 150 MG  tablet Take 150 mg by mouth daily.    Marland Kitchen zolpidem (AMBIEN) 10 MG tablet TK 1 T PO QHS PRN  4  . divalproex (DEPAKOTE ER) 250 MG 24 hr tablet Start with one pill at night. In 2 weeks can increase to 2 pills at night. 60 tablet 6  . fluticasone (FLONASE) 50 MCG/ACT nasal spray INSTILL 2 SPRAYS IEN QD  12   Current Facility-Administered Medications  Medication Dose Route Frequency Provider Last Rate Last Dose  . albuterol (PROVENTIL) (2.5 MG/3ML) 0.083% nebulizer solution 2.5 mg  2.5 mg Nebulization Once [employer], DO      . bupivacaine (  PF) (MARCAINE) 0.25 % injection 30 mL  30 mL Other Once Mohammed Kindle, MD        Allergies as of 01/01/2017 - Review Complete 01/01/2017  Allergen Reaction Noted  . Lexapro [escitalopram oxalate] Other (See Comments) 09/07/2014    Vitals: BP 108/72   Pulse 68   Ht '5\' 2"'$  (1.575 m)   Wt 229 lb 9.6 oz (104.1 kg)   BMI 41.99 kg/m  Last Weight:  Wt Readings from Last 1 Encounters:  01/01/17 229 lb 9.6 oz (104.1 kg)   Last Height:   Ht Readings from Last 1 Encounters:  01/01/17 '5\' 2"'$  (1.575 m)   Physical exam: Exam: Gen: NAD, conversant, well nourised, obese, well groomed                     CV: RRR, no MRG. No Carotid Bruits. No peripheral edema, warm, nontender Eyes: Conjunctivae clear without exudates or hemorrhage  Neuro: Detailed Neurologic Exam  Speech:    Speech is normal; fluent and spontaneous with normal comprehension.  Cognition:    The patient is oriented to person, place, and time;     recent and remote memory intact;     language fluent;     normal attention, concentration,     fund of knowledge Cranial Nerves:    The pupils are equal, round, and reactive to light. Attempted fundoscopic exam could not visualize due to small pupils. Visual fields are full to finger confrontation. Extraocular movements are intact. Trigeminal sensation is intact and the muscles of mastication are normal. The face is symmetric. The palate  elevates in the midline. Hearing intact. Voice is normal. Shoulder shrug is normal. The tongue has normal motion without fasciculations.   Coordination:    No dysmetria  Gait:    Antalgic with a cane  Motor Observation:    no involuntary movements noted. Tone:    Normal muscle tone.    Posture:    Posture is slightly stooped with a cane but normal in chair    Strength:   Giveway throughout due to pain but strength appears symmetric and intact.      Sensation: intact to LT     Reflex Exam:  DTR's:    Absent AJs. Declined Patellars due to pain. Otherwise deep tendon reflexes in the uppers are normal bilaterally.   Toes:    The toes are equivocal bilaterally.   Clonus:    Clonus is absent.       Assessment/Plan:   56 y.o. female here as a referral from Dr. Wynetta Emery for migaines. Migraines started in her teen years. She has 15-16 migraine days a month and has failed multiple medications. Will try Depakote. Declined Botox for migraines. Discussed side effects as per patient instructions. RTC 3 months.   Discussed: To prevent or relieve headaches, try the following: Cool Compress. Lie down and place a cool compress on your head.  Avoid headache triggers. If certain foods or odors seem to have triggered your migraines in the past, avoid them. A headache diary might help you identify triggers.  Include physical activity in your daily routine. Try a daily walk or other moderate aerobic exercise.  Manage stress. Find healthy ways to cope with the stressors, such as delegating tasks on your to-do list.  Practice relaxation techniques. Try deep breathing, yoga, massage and visualization.  Eat regularly. Eating regularly scheduled meals and maintaining a healthy diet might help prevent headaches. Also, drink plenty of fluids.  Follow  a regular sleep schedule. Sleep deprivation might contribute to headaches Consider biofeedback. With this mind-body technique, you learn to control certain  bodily functions - such as muscle tension, heart rate and blood pressure - to prevent headaches or reduce headache pain.    Proceed to emergency room if you experience new or worsening symptoms or symptoms do not resolve, if you have new neurologic symptoms or if headache is severe, or for any concerning symptom.    Sarina Ill, MD  [facility] [address] [place], [place] [address]  Phone [phone] Fax [phone]

## 2017-01-01 ENCOUNTER — Ambulatory Visit (INDEPENDENT_AMBULATORY_CARE_PROVIDER_SITE_OTHER): Payer: Medicare Other | Admitting: Neurology

## 2017-01-01 ENCOUNTER — Encounter: Payer: Self-pay | Admitting: Neurology

## 2017-01-01 ENCOUNTER — Encounter (INDEPENDENT_AMBULATORY_CARE_PROVIDER_SITE_OTHER): Payer: Self-pay

## 2017-01-01 VITALS — BP 108/72 | HR 68 | Ht 62.0 in | Wt 229.6 lb

## 2017-01-01 DIAGNOSIS — G43711 Chronic migraine without aura, intractable, with status migrainosus: Secondary | ICD-10-CM | POA: Diagnosis not present

## 2017-01-01 MED ORDER — DIVALPROEX SODIUM ER 250 MG PO TB24: ORAL_TABLET | ORAL | 6 refills | Status: DC

## 2017-01-01 NOTE — Patient Instructions (Signed)
Remember to drink plenty of fluid, eat healthy meals and do not skip any meals. Try to eat protein with a every meal and eat a healthy snack such as fruit or nuts in between meals. Try to keep a regular sleep-wake schedule and try to exercise daily, particularly in the form of walking, 20-30 minutes a day, if you can.   As far as your medications are concerned, I would like to suggest: Depakote (Valproic acid) start with one pill '250mg'$  then in 2 weeks if no side effects increase to 2 pills at bedtime ('500mg'$ )  I would like to see you back in 3 months, sooner if we need to. Please call us with any interim questions, concerns, problems, updates or refill requests.   Our phone number is [phone]. We also have an after hours call service for urgent matters and there is a physician on-call for urgent questions. For any emergencies you know to call 911 or go to the nearest emergency room  Valproic Acid, Divalproex Sodium delayed or extended-release tablets What is this medicine? DIVALPROEX SODIUM (dye VAL pro ex SO dee um) is used to prevent seizures caused by some forms of epilepsy. It is also used to treat bipolar mania and to prevent migraine headaches. This medicine may be used for other purposes; ask your health care provider or pharmacist if you have questions. COMMON BRAND NAME(S): Depakote, Depakote ER What should I tell my health care provider before I take this medicine? They need to know if you have any of these conditions: -if you often drink alcohol -kidney disease -liver disease -low platelet counts -mitochondrial disease -suicidal thoughts, plans, or attempt; a previous suicide attempt by you or a family member -urea cycle disorder (UCD) -an unusual or allergic reaction to divalproex sodium, sodium valproate, valproic acid, other medicines, foods, dyes, or preservatives -pregnant or trying to get pregnant -breast-feeding How should I use this medicine? Take this medicine by  mouth with a drink of water. Follow the directions on the prescription label. Do not cut, crush or chew this medicine. You can take it with or without food. If it upsets your stomach, take it with food. Take your medicine at regular intervals. Do not take it more often than directed. Do not stop taking except on your doctor's advice. A special MedGuide will be given to you by the pharmacist with each prescription and refill. Be sure to read this information carefully each time. Talk to your pediatrician regarding the use of this medicine in children. While this drug may be prescribed for children as young as 10 years for selected conditions, precautions do apply. Overdosage: If you think you have taken too much of this medicine contact a poison control center or emergency room at once. NOTE: This medicine is only for you. Do not share this medicine with others. What if I miss a dose? If you miss a dose, take it as soon as you can. If it is almost time for your next dose, take only that dose. Do not take double or extra doses. What may interact with this medicine? Do not take this medicine with any of the following medications: -sodium phenylbutyrate This medicine may also interact with the following medications: -aspirin -certain antibiotics like ertapenem, imipenem, meropenem -certain medicines for depression, anxiety, or psychotic disturbances -certain medicines for seizures like carbamazepine, clonazepam, diazepam, ethosuximide, felbamate, lamotrigine, phenobarbital, phenytoin, primidone, rufinamide, topiramate -certain medicines that treat or prevent blood clots like warfarin -cholestyramine -female hormones, like estrogens and birth  control pills, patches, or rings -propofol -rifampin -ritonavir -tolbutamide -zidovudine This list may not describe all possible interactions. Give your health care provider a list of all the medicines, herbs, non-prescription drugs, or dietary supplements you  use. Also tell them if you smoke, drink alcohol, or use illegal drugs. Some items may interact with your medicine. What should I watch for while using this medicine? Tell your doctor or healthcare professional if your symptoms do not get better or they start to get worse. Wear a medical ID bracelet or chain, and carry a card that describes your disease and details of your medicine and dosage times. You may get drowsy, dizzy, or have blurred vision. Do not drive, use machinery, or do anything that needs mental alertness until you know how this medicine affects you. To reduce dizzy or fainting spells, do not sit or stand up quickly, especially if you are an older patient. Alcohol can increase drowsiness and dizziness. Avoid alcoholic drinks. This medicine can make you more sensitive to the sun. Keep out of the sun. If you cannot avoid being in the sun, wear protective clothing and use sunscreen. Do not use sun lamps or tanning beds/booths. Patients and their families should watch out for new or worsening depression or thoughts of suicide. Also watch out for sudden changes in feelings such as feeling anxious, agitated, panicky, irritable, hostile, aggressive, impulsive, severely restless, overly excited and hyperactive, or not being able to sleep. If this happens, especially at the beginning of treatment or after a change in dose, call your health care professional. Women should inform their doctor if they wish to become pregnant or think they might be pregnant. There is a potential for serious side effects to an unborn child. Talk to your health care professional or pharmacist for more information. Women who become pregnant while using this medicine may enroll in the [place] Pregnancy Registry by calling [phone]. This registry collects information about the safety of antiepileptic drug use during pregnancy. What side effects may I notice from receiving this medicine? Side  effects that you should report to your doctor or health care professional as soon as possible: -allergic reactions like skin rash, itching or hives, swelling of the face, lips, or tongue -changes in vision -redness, blistering, peeling or loosening of the skin, including inside the mouth -signs and symptoms of liver injury like dark yellow or brown urine; general ill feeling or flu-like symptoms; light-colored stools; loss of appetite; nausea; right upper belly pain; unusually weak or tired; yellowing of the eyes or skin -suicidal thoughts or other mood changes -unusual bleeding or bruising Side effects that usually do not require medical attention (report to your doctor or health care professional if they continue or are bothersome): -constipation -diarrhea -dizziness -hair loss -headache -loss of appetite -weight gain This list may not describe all possible side effects. Call your doctor for medical advice about side effects. You may report side effects to FDA at 1-800-FDA-1088. Where should I keep my medicine? Keep out of reach of children. Store at room temperature between 15 and 30 degrees C (59 and 86 degrees F). Keep container tightly closed. Throw away any unused medicine after the expiration date. NOTE: This sheet is a summary. It may not cover all possible information. If you have questions about this medicine, talk to your doctor, pharmacist, or health care provider.  2018 Elsevier/Gold Standard (2016-01-09 07:11:40)

## 2017-01-05 ENCOUNTER — Other Ambulatory Visit: Payer: Self-pay | Admitting: Family Medicine

## 2017-01-08 ENCOUNTER — Ambulatory Visit: Payer: Self-pay | Admitting: Family Medicine

## 2017-01-20 DIAGNOSIS — L089 Local infection of the skin and subcutaneous tissue, unspecified: Secondary | ICD-10-CM | POA: Diagnosis not present

## 2017-01-22 DIAGNOSIS — L03031 Cellulitis of right toe: Secondary | ICD-10-CM | POA: Diagnosis not present

## 2017-01-22 DIAGNOSIS — M79674 Pain in right toe(s): Secondary | ICD-10-CM | POA: Diagnosis not present

## 2017-01-22 DIAGNOSIS — L600 Ingrowing nail: Secondary | ICD-10-CM | POA: Diagnosis not present

## 2017-02-08 ENCOUNTER — Ambulatory Visit: Payer: Medicare Other | Admitting: Family Medicine

## 2017-02-08 ENCOUNTER — Other Ambulatory Visit: Payer: Self-pay | Admitting: Family Medicine

## 2017-02-20 ENCOUNTER — Other Ambulatory Visit: Payer: Self-pay | Admitting: Unknown Physician Specialty

## 2017-03-22 ENCOUNTER — Emergency Department
Admission: EM | Admit: 2017-03-22 | Discharge: 2017-03-22 | Disposition: A | Discharge status: Home / Self Care | Payer: Medicare Other | Attending: Emergency Medicine | Admitting: Emergency Medicine

## 2017-03-22 ENCOUNTER — Emergency Department: Payer: Medicare Other

## 2017-03-22 ENCOUNTER — Telehealth: Payer: Self-pay | Admitting: Family Medicine

## 2017-03-22 ENCOUNTER — Encounter: Payer: Self-pay | Admitting: *Deleted

## 2017-03-22 DIAGNOSIS — R109 Unspecified abdominal pain: Secondary | ICD-10-CM | POA: Diagnosis not present

## 2017-03-22 DIAGNOSIS — Z87891 Personal history of nicotine dependence: Secondary | ICD-10-CM | POA: Diagnosis not present

## 2017-03-22 DIAGNOSIS — R1031 Right lower quadrant pain: Secondary | ICD-10-CM | POA: Diagnosis present

## 2017-03-22 DIAGNOSIS — Z79899 Other long term (current) drug therapy: Secondary | ICD-10-CM | POA: Insufficient documentation

## 2017-03-22 DIAGNOSIS — Z8521 Personal history of malignant neoplasm of larynx: Secondary | ICD-10-CM | POA: Insufficient documentation

## 2017-03-22 DIAGNOSIS — R110 Nausea: Secondary | ICD-10-CM | POA: Insufficient documentation

## 2017-03-22 DIAGNOSIS — I10 Essential (primary) hypertension: Secondary | ICD-10-CM | POA: Insufficient documentation

## 2017-03-22 DIAGNOSIS — Z96653 Presence of artificial knee joint, bilateral: Secondary | ICD-10-CM | POA: Diagnosis not present

## 2017-03-22 DIAGNOSIS — Z7982 Long term (current) use of aspirin: Secondary | ICD-10-CM | POA: Insufficient documentation

## 2017-03-22 DIAGNOSIS — K529 Noninfective gastroenteritis and colitis, unspecified: Secondary | ICD-10-CM | POA: Insufficient documentation

## 2017-03-22 DIAGNOSIS — R1030 Lower abdominal pain, unspecified: Secondary | ICD-10-CM | POA: Diagnosis not present

## 2017-03-22 DIAGNOSIS — K760 Fatty (change of) liver, not elsewhere classified: Secondary | ICD-10-CM | POA: Diagnosis not present

## 2017-03-22 DIAGNOSIS — J449 Chronic obstructive pulmonary disease, unspecified: Secondary | ICD-10-CM | POA: Diagnosis not present

## 2017-03-22 DIAGNOSIS — R197 Diarrhea, unspecified: Secondary | ICD-10-CM | POA: Diagnosis not present

## 2017-03-22 LAB — COMPREHENSIVE METABOLIC PANEL
ALK PHOS: 90 U/L (ref 38–126)
ALT: 40 U/L (ref 14–54)
ANION GAP: 8 (ref 5–15)
AST: 55 U/L — ABNORMAL HIGH (ref 15–41)
Albumin: 4.2 g/dL (ref 3.5–5.0)
BUN: 10 mg/dL (ref 6–20)
CALCIUM: 9.4 mg/dL (ref 8.9–10.3)
CHLORIDE: 105 mmol/L (ref 101–111)
CO2: 24 mmol/L (ref 22–32)
CREATININE: 0.94 mg/dL (ref 0.44–1.00)
GFR calc Af Amer: >60 mL/min (ref >60)
Glucose, Bld: 111 mg/dL — ABNORMAL HIGH (ref 65–99)
Potassium: 3.9 mmol/L (ref 3.5–5.1)
Sodium: 137 mmol/L (ref 135–145)
Total Bilirubin: 0.9 mg/dL (ref 0.3–1.2)
Total Protein: 8.3 g/dL — ABNORMAL HIGH (ref 6.5–8.1)

## 2017-03-22 LAB — CBC
HCT: 40 % (ref 35.0–47.0)
Hemoglobin: 13.5 g/dL (ref 12.0–16.0)
MCH: 28.6 pg (ref 26.0–34.0)
MCHC: 33.7 g/dL (ref 32.0–36.0)
MCV: 84.8 fL (ref 80.0–100.0)
PLATELETS: 239 10*3/uL (ref 150–440)
RBC: 4.72 MIL/uL (ref 3.80–5.20)
RDW: 14.9 % — ABNORMAL HIGH (ref 11.5–14.5)
WBC: 7.1 10*3/uL (ref 3.6–11.0)

## 2017-03-22 LAB — LIPASE, BLOOD: LIPASE: 24 U/L (ref 11–51)

## 2017-03-22 MED ORDER — OXYCODONE-ACETAMINOPHEN 5-325 MG PO TABS: 1.0000 | ORAL_TABLET | ORAL | 0 refills | Status: DC | PRN

## 2017-03-22 MED ORDER — MORPHINE SULFATE (PF) 4 MG/ML IV SOLN
4.0000 mg | Freq: Once | INTRAVENOUS | Status: AC
  Administered 2017-03-22: 4 mg via INTRAVENOUS

## 2017-03-22 MED ORDER — OXYCODONE-ACETAMINOPHEN 5-325 MG PO TABS
2.0000 | ORAL_TABLET | Freq: Once | ORAL | Status: AC
  Administered 2017-03-22: 2 via ORAL
  Filled 2017-03-22: qty 2

## 2017-03-22 MED ORDER — CIPROFLOXACIN HCL 500 MG PO TABS
500.0000 mg | ORAL_TABLET | ORAL | Status: AC
  Administered 2017-03-22: 500 mg via ORAL
  Filled 2017-03-22: qty 1

## 2017-03-22 MED ORDER — MORPHINE SULFATE (PF) 4 MG/ML IV SOLN
4.0000 mg | Freq: Once | INTRAVENOUS | Status: AC
  Administered 2017-03-22: 4 mg via INTRAVENOUS
  Filled 2017-03-22: qty 1

## 2017-03-22 MED ORDER — IOPAMIDOL (ISOVUE-300) INJECTION 61%
100.0000 mL | Freq: Once | INTRAVENOUS | Status: AC | PRN
  Administered 2017-03-22: 100 mL via INTRAVENOUS
  Filled 2017-03-22: qty 100

## 2017-03-22 MED ORDER — CIPROFLOXACIN HCL 500 MG PO TABS: 500.0000 mg | ORAL_TABLET | Freq: Two times a day (BID) | ORAL | 0 refills | Status: AC

## 2017-03-22 MED ORDER — METRONIDAZOLE 500 MG PO TABS: 500.0000 mg | ORAL_TABLET | Freq: Three times a day (TID) | ORAL | 0 refills | Status: AC

## 2017-03-22 MED ORDER — METRONIDAZOLE 500 MG PO TABS
500.0000 mg | ORAL_TABLET | Freq: Once | ORAL | Status: AC
  Administered 2017-03-22: 500 mg via ORAL
  Filled 2017-03-22: qty 1

## 2017-03-22 MED ORDER — ONDANSETRON HCL 4 MG/2ML IJ SOLN
4.0000 mg | INTRAMUSCULAR | Status: AC
  Administered 2017-03-22: 4 mg via INTRAVENOUS

## 2017-03-22 MED ORDER — ONDANSETRON 4 MG PO TBDP: ORAL_TABLET | ORAL | 0 refills | Status: DC

## 2017-03-22 MED ORDER — SODIUM CHLORIDE 0.9 % IV BOLUS (SEPSIS)
500.0000 mL | INTRAVENOUS | Status: AC
  Administered 2017-03-22: 500 mL via INTRAVENOUS

## 2017-03-22 MED ORDER — IOPAMIDOL (ISOVUE-300) INJECTION 61%
30.0000 mL | Freq: Once | INTRAVENOUS | Status: AC | PRN
  Administered 2017-03-22: 30 mL via ORAL
  Filled 2017-03-22: qty 30

## 2017-03-22 MED ORDER — MORPHINE SULFATE (PF) 4 MG/ML IV SOLN
INTRAVENOUS | Status: AC
  Filled 2017-03-22: qty 1

## 2017-03-22 MED ORDER — ONDANSETRON HCL 4 MG/2ML IJ SOLN
INTRAMUSCULAR | Status: AC
  Filled 2017-03-22: qty 2

## 2017-03-22 MED ORDER — DOCUSATE SODIUM 100 MG PO CAPS: ORAL_CAPSULE | ORAL | 0 refills | Status: DC

## 2017-03-22 NOTE — ED Notes (Signed)
Pt discharged to home.  Family member driving.  Discharge instructions reviewed.  Verbalized understanding.  No questions or concerns at this time.  Teach back verified.  Pt in NAD.  No items left in ED.   

## 2017-03-22 NOTE — Discharge Instructions (Signed)
As we discussed, your workup was generally reassuring.  Your CT scan shows that you have an area of infection or inflammation in your intestines called colitis.  We are treating with antibiotics and pain medication and encourage you to follow up with your primary care doctor within this week if possible and hopefully at least by next week.  **Please do not take your Zanaflex while you are on the antibiotics because you can have unintended medication side effects.**  Please use over-the-counter ibuprofen and Tylenol for pain according to the label instructions.  Take Percocet as prescribed for severe pain. Do not drink alcohol, drive or participate in any other potentially dangerous activities while taking this medication as it may make you sleepy. Do not take this medication with any other sedating medications, either prescription or over-the-counter. If you were prescribed Percocet or Vicodin, do not take these with acetaminophen (Tylenol) as it is already contained within these medications.   This medication is an opiate (or narcotic) pain medication and can be habit forming.  Use it as little as possible to achieve adequate pain control.  Do not use or use it with extreme caution if you have a history of opiate abuse or dependence.  If you are on a pain contract with your primary care doctor or a pain specialist, be sure to let them know you were prescribed this medication today from the [facility] Emergency Department.  This medication is intended for your use only - do not give any to anyone else and keep it in a secure place where nobody else, especially children, have access to it.  It will also cause or worsen constipation, so you may want to consider taking an over-the-counter stool softener while you are taking this medication.  Return to the ED if your abdominal pain worsens or fails to improve, you develop bloody vomiting, bloody diarrhea, you are unable to tolerate fluids due to vomiting,  fever greater than 101, or other symptoms that concern you.

## 2017-03-22 NOTE — ED Provider Notes (Signed)
[facility] Emergency Department Provider Note  ____________________________________________   First MD Initiated Contact with Patient 03/22/17 1857     (approximate)  I have reviewed the triage vital signs and the nursing notes.   HISTORY  Chief Complaint Abdominal Pain    HPI Susan Houston is a 56 y.o. female who presents for evaluation of gradually worsening pain in her lower abdomen on both sides over the last 5 days.  She reports a history of diverticulitis and states that this feels similar.  She has had nausea and multiple episodes of diarrhea but no vomiting.  She has not had much of an appetite for 5 days but she has continued to drink some fluids.  She has had less to drink today because of the pain in her abdomen that she describes as aching and cramping and occasionally sharp.  She states that she had some blood in her stool but that was at the beginning of the process and she has had no blood in her stool for several days.  She denies fever/chills, chest pain, shortness of breath.  Nothing in particular makes the patient's symptoms better nor worse.  The pain is currently severe.   Past Medical History:  Diagnosis Date  . Anxiety   . Arthritis   . Asthma   . Back pain   . Bruises easily   . 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 Chronic leg pain    BILATERAL  . COPD (chronic obstructive pulmonary disease) ([place])   . Depression   . Difficulty sleeping   . Diverticulitis   . Hemorrhoids   . History of kidney stones   . Hyperlipidemia   . Hypertension   . Incontinence of urine   . Migraine   . MRSA infection greater than 3 months ago    2008 right side of face  . Nerve damage    right leg    Patient Active Problem List   Diagnosis Date Noted  . Migraine without aura and without status migrainosus, not intractable 09/11/2016  . Hyperlipidemia   . COPD (chronic obstructive pulmonary disease) ([place])   .  Chronic pain 12/24/2015  . Severe depression ([place]) 12/24/2015  . Chronic anxiety 12/24/2015  . Coarse tremors 12/24/2015  . Facet syndrome, lumbar ([place]) 03/28/2015  . DDD (degenerative disc disease), lumbar 03/05/2015  . DDD (degenerative disc disease), lumbosacral 02/23/2015  . Sacroiliac joint dysfunction 02/23/2015  . Saphenous neuralgia 09/24/2014    Past Surgical History:  Procedure Laterality Date  . ABDOMINAL HYSTERECTOMY  1983  . BLADDER SUSPENSION  2014  . CHOLECYSTECTOMY    . COLONOSCOPY    . ESOPHAGOGASTRODUODENOSCOPY    . ESOPHAGOGASTRODUODENOSCOPY (EGD) WITH PROPOFOL N/A 11/20/2016   Procedure: ESOPHAGOGASTRODUODENOSCOPY (EGD) WITH PROPOFOL;  Surgeon: Jonathon Bellows, MD;  Location: [facility] ENDOSCOPY;  Service: Endoscopy;  Laterality: N/A;  . EXCISION NEUROMA Bilateral 09/24/2014   Procedure: BILATERAL OPEN SAPHENOUS NEURECTOMIES AND OPEN LEFT PERIPATELLA OSETOPHYTECTOMY;  Surgeon: Mauri Pole, MD;  Location: [facility] ORS;  Service: Orthopedics;  Laterality: Bilateral;  . JOINT REPLACEMENT  2009/2010   BIL TOTAL KNEES  . TONSILLECTOMY      Prior to Admission medications   Medication Sig Start Date End Date Taking? Authorizing Provider  aspirin 81 MG tablet Take 81 mg by mouth daily.    [provider]  busPIRone (BUSPAR) 15 MG tablet Take 15 mg by mouth daily. 07/23/16   [provider]  ciprofloxacin (CIPRO) 500 MG tablet Take 1 tablet (500 mg total) by mouth 2 (two) times daily. 03/22/17 03/29/17  Hinda Kehr, MD  divalproex (DEPAKOTE ER) 250 MG 24 hr tablet Start with one pill at night. In 2 weeks can increase to 2 pills at night. 01/01/17   Melvenia Beam, MD  docusate sodium (COLACE) 100 MG capsule Take 1 tablet once or twice daily as needed for constipation while taking narcotic pain medicine 03/22/17   Hinda Kehr, MD  doxepin (SINEQUAN) 50 MG capsule TK 1 OR 2 CS PO QHS PRN 01/23/16   [provider]  fluticasone (FLONASE) 50 MCG/ACT nasal spray  INSTILL 2 SPRAYS IEN QD 12/25/16   [provider]  Fluticasone-Salmeterol (ADVAIR DISKUS) 250-50 MCG/DOSE AEPB Inhale 1 puff into the lungs 2 (two) times daily. 06/15/16   Kathrine Haddock, NP  gabapentin (NEURONTIN) 300 MG capsule Take 300 mg by mouth 3 (three) times daily.    [provider]  ipratropium-albuterol (DUONEB) 0.5-2.5 (3) MG/3ML SOLN Take 3 mLs by nebulization every 4 (four) hours as needed. 03/03/16   Kathrine Haddock, NP  LORazepam (ATIVAN) 0.5 MG tablet Take by mouth. 2 tablets at bedtime Reported on 02/26/2016 01/28/16   [provider]  metroNIDAZOLE (FLAGYL) 500 MG tablet Take 1 tablet (500 mg total) by mouth 3 (three) times daily. 03/22/17 03/29/17  Hinda Kehr, MD  omeprazole (PRILOSEC) 40 MG capsule Take 1 capsule (40 mg total) by mouth daily. 11/11/16 02/09/17  Jonathon Bellows, MD  ondansetron (ZOFRAN ODT) 4 MG disintegrating tablet Allow 1-2 tablets to dissolve in your mouth every 8 hours as needed for nausea/vomiting 03/22/17   Hinda Kehr, MD  oxyCODONE-acetaminophen (ROXICET) 5-325 MG tablet Take 1-2 tablets by mouth every 4 (four) hours as needed for severe pain. 03/22/17   Hinda Kehr, MD  sertraline (ZOLOFT) 100 MG tablet TK 2 TS PO QD 08/14/16   [provider]  SUMAtriptan (IMITREX) 100 MG tablet TAKE 1 TABLET BY MOUTH 1 TIME AT START OF MIGRAINE. MAY REPEAT IN 2 HOURS IF HEADACHE PERSISTS OR RECURS.NO MORE THAN 2 IN 24 HOURS 02/09/17   Johnson, Megan P, DO  topiramate (TOPAMAX) 100 MG tablet TAKE 1 TABLET(100 MG) BY MOUTH TWICE DAILY 02/09/17   Johnson, Megan P, DO  traZODone (DESYREL) 150 MG tablet Take 150 mg by mouth daily. 07/23/16   [provider]  zolpidem (AMBIEN) 10 MG tablet TK 1 T PO QHS PRN 12/17/16   [provider]    Allergies Lexapro [escitalopram oxalate]  Family History  Problem Relation Age of Onset  . Asthma Mother   . Diabetes Mother   . Hyperlipidemia Mother   . Hypertension Mother   . Cirrhosis Mother         Non-alcoholic  . Arthritis Father   . Cancer Father        Bone  . Hyperlipidemia Father   . Hypertension Father   . Kidney Stones Son   . Stroke Maternal Grandmother   . Cancer Maternal Grandfather        lung  . Pneumonia Paternal Grandmother   . Alzheimer's disease Paternal Grandfather   . Diabetes Sister   . Gout Sister   . Hypertension Sister   . Alcohol abuse Brother   . Heart disease Brother        massive MI    Social History Social History  Substance Use Topics  . Smoking status: Former Smoker    Packs/day:  0.50    Types: Cigarettes    Quit date: 04/07/2016  . Smokeless tobacco: Never Used     Comment: patient has not smoked x 10 days.  . Alcohol use No    Review of Systems Constitutional: No fever/chills Cardiovascular: Denies chest pain. Respiratory: Denies shortness of breath. Gastrointestinal: Worsening lower abdominal pain x 5 days with nausea and diarrhea, no vomiting.  Lack of appetite. Genitourinary: Negative for dysuria. Musculoskeletal: Negative for neck pain.  Negative for back pain. Integumentary: Negative for rash. Neurological: Negative for headaches, focal weakness or numbness.   ____________________________________________   PHYSICAL EXAM:  VITAL SIGNS: ED Triage Vitals  Enc Vitals Group     BP 03/22/17 1447 (!) 144/92     Pulse Rate 03/22/17 1447 93     Resp 03/22/17 1447 16     Temp 03/22/17 1447 98.8 F (37.1 C)     Temp Source 03/22/17 1447 Oral     SpO2 03/22/17 1447 96 %     Weight 03/22/17 1445 103.9 kg (229 lb)     Height 03/22/17 1445 1.575 m (5\' 2" )     Head Circumference --      Peak Flow --      Pain Score 03/22/17 1445 8     Pain Loc --      Pain Edu? --      Excl. in [place]? --     Constitutional: Alert and oriented. Not in acute distress but does appear uncomfortable. Eyes: Conjunctivae are normal.  Head: Atraumatic. Nose: No congestion/rhinnorhea. Mouth/Throat: Mucous membranes are moist. Neck: No  stridor.  No meningeal signs.   Cardiovascular: Normal rate, regular rhythm. Good peripheral circulation. Grossly normal heart sounds. Respiratory: Normal respiratory effort.  No retractions. Lungs CTAB. Gastrointestinal: Soft with tenderness to palpation of the lower abdomen, more pronounced on the right than the left.  No rebound/guarding but tenderness is moderate to severe.  No upper abd tenderness. Musculoskeletal: No lower extremity tenderness nor edema. No gross deformities of extremities. Neurologic:  Normal speech and language. No gross focal neurologic deficits are appreciated.  Skin:  Skin is warm, dry and intact. No rash noted. Psychiatric: Mood and affect are normal. Speech and behavior are normal.  ____________________________________________   LABS (all labs ordered are listed, but only abnormal results are displayed)  Labs Reviewed  COMPREHENSIVE METABOLIC PANEL - Abnormal; Notable for the following:       Result Value   Glucose, Bld 111 (*)    Total Protein 8.3 (*)    AST 55 (*)    All other components within normal limits  CBC - Abnormal; Notable for the following:    RDW 14.9 (*)    All other components within normal limits  LIPASE, BLOOD  URINALYSIS, COMPLETE (UACMP) WITH MICROSCOPIC   ____________________________________________  EKG  None - EKG not ordered by ED physician ____________________________________________  RADIOLOGY   Ct Abdomen Pelvis W Contrast  Result Date: 03/22/2017 CLINICAL DATA:  Abdomen pain with nausea and diarrhea EXAM: CT ABDOMEN AND PELVIS WITH CONTRAST TECHNIQUE: Multidetector CT imaging of the abdomen and pelvis was performed using the standard protocol following bolus administration of intravenous contrast. CONTRAST:  154mL ISOVUE-300 IOPAMIDOL (ISOVUE-300) INJECTION 61% COMPARISON:  05/18/2014 FINDINGS: Lower chest: Lung bases demonstrate no acute consolidation or pleural effusion. Normal heart size. Hepatobiliary: Hepatic  steatosis. Surgical clips in the gallbladder fossa. No biliary dilatation Pancreas: Unremarkable. No pancreatic ductal dilatation or surrounding inflammatory changes. Spleen: Normal in size without focal abnormality.  Adrenals/Urinary Tract: Adrenal glands are unremarkable. Kidneys are normal, without renal calculi, focal lesion, or hydronephrosis. Bladder is unremarkable. Stomach/Bowel: Stomach is nonenlarged. No dilated small bowel. Questionable mild wall thickening of the hepatic and splenic flexures. Appendix not well identified, no right lower quadrant inflammation. Vascular/Lymphatic: Aortic atherosclerosis. No aneurysm is seen. No significantly enlarged lymph nodes. Reproductive: Status post hysterectomy. No adnexal masses. Other: No free air or free fluid. Fat containing periumbilical hernias. Musculoskeletal: No acute or significant osseous findings. IMPRESSION: 1. Questionable wall thickening/mild colitis versus under distension of the colon at the hepatic and splenic flexures. 2. Otherwise no acute intra-abdominal or pelvic pathology is seen 3. Hepatic steatosis 4. Fatty periumbilical hernias. Electronically Signed   By: Donavan Foil M.D.   On: 03/22/2017 20:59    ____________________________________________   PROCEDURES  Critical Care performed: No   Procedure(s) performed:   Procedures   ____________________________________________   INITIAL IMPRESSION / ASSESSMENT AND PLAN / ED COURSE  Pertinent labs & imaging results that were available during my care of the patient were reviewed by me and considered in my medical decision making (see chart for details).  The patient does appear to be in a fair amount of discomfort and certainly is tender to palpation.  Her lab work is generally reassuring and includes no leukocytosis or elevation of her lipase and normal electrolytes.  She did not provide a urine sample but is not having any dysuria.  I obtained a CT scan of her abdomen and  pelvis to rule out diverticulitis and appendicitis and it is notable for an area what appears to be colitis, uncertain whether infectious, but I will treat empirically given the duration of her symptoms and that she has had similar issues in the past with diverticulitis.  Though generally I try to avoid Cipro due to some of the manufacturer warnings, I feel that Cipro and Flagyl are the most appropriate medications for her.  I am also giving her pain medication and Zofran and encouraged close outpatient follow-up.  I gave my usual and customary return precautions.  Patient and her husband understand and agree with the plans.        Custom d/c instructions for patient:  As we discussed, your workup was generally reassuring.  Your CT scan shows that you have an area of infection or inflammation in your intestines called colitis.  We are treating with antibiotics and pain medication and encourage you to follow up with your primary care doctor within this week if possible and hopefully at least by next week.  **Please do not take your Zanaflex while you are on the antibiotics because you can have unintended medication side effects.**  Please use over-the-counter ibuprofen and Tylenol for pain according to the label instructions.  Take Percocet as prescribed for severe pain. Do not drink alcohol, drive or participate in any other potentially dangerous activities while taking this medication as it may make you sleepy. Do not take this medication with any other sedating medications, either prescription or over-the-counter. If you were prescribed Percocet or Vicodin, do not take these with acetaminophen (Tylenol) as it is already contained within these medications.   This medication is an opiate (or narcotic) pain medication and can be habit forming.  Use it as little as possible to achieve adequate pain control.  Do not use or use it with extreme caution if you have a history of opiate abuse or dependence.   If you are on a pain contract with your primary  care doctor or a pain specialist, be sure to let them know you were prescribed this medication today from the [facility] Emergency Department.  This medication is intended for your use only - do not give any to anyone else and keep it in a secure place where nobody else, especially children, have access to it.  It will also cause or worsen constipation, so you may want to consider taking an over-the-counter stool softener while you are taking this medication.  Return to the ED if your abdominal pain worsens or fails to improve, you develop bloody vomiting, bloody diarrhea, you are unable to tolerate fluids due to vomiting, fever greater than 101, or other symptoms that concern you.   ____________________________________________  FINAL CLINICAL IMPRESSION(S) / ED DIAGNOSES  Final diagnoses:  Colitis  Lower abdominal pain  Nausea     MEDICATIONS GIVEN DURING THIS VISIT:  Medications  morphine 4 MG/ML injection 4 mg (4 mg Intravenous Given 03/22/17 1910)  ondansetron (ZOFRAN) injection 4 mg (4 mg Intravenous Given 03/22/17 1910)  sodium chloride 0.9 % bolus 500 mL (0 mLs Intravenous Stopped 03/22/17 1946)  iopamidol (ISOVUE-300) 61 % injection 30 mL (30 mLs Oral Contrast Given 03/22/17 1913)  morphine 4 MG/ML injection 4 mg (4 mg Intravenous Given 03/22/17 2001)  iopamidol (ISOVUE-300) 61 % injection 100 mL (100 mLs Intravenous Contrast Given 03/22/17 2035)  oxyCODONE-acetaminophen (PERCOCET/ROXICET) 5-325 MG per tablet 2 tablet (2 tablets Oral Given 03/22/17 2210)  ciprofloxacin (CIPRO) tablet 500 mg (500 mg Oral Given 03/22/17 2210)  metroNIDAZOLE (FLAGYL) tablet 500 mg (500 mg Oral Given 03/22/17 2210)     NEW OUTPATIENT MEDICATIONS STARTED DURING THIS VISIT:  New Prescriptions   CIPROFLOXACIN (CIPRO) 500 MG TABLET    Take 1 tablet (500 mg total) by mouth 2 (two) times daily.   DOCUSATE SODIUM (COLACE) 100 MG CAPSULE    Take 1 tablet once or  twice daily as needed for constipation while taking narcotic pain medicine   METRONIDAZOLE (FLAGYL) 500 MG TABLET    Take 1 tablet (500 mg total) by mouth 3 (three) times daily.   ONDANSETRON (ZOFRAN ODT) 4 MG DISINTEGRATING TABLET    Allow 1-2 tablets to dissolve in your mouth every 8 hours as needed for nausea/vomiting   OXYCODONE-ACETAMINOPHEN (ROXICET) 5-325 MG TABLET    Take 1-2 tablets by mouth every 4 (four) hours as needed for severe pain.    Modified Medications   No medications on file    Discontinued Medications   TIZANIDINE (ZANAFLEX) 4 MG TABLET    TAKE 1 TABLET BY MOUTH THREE TIMES DAILY AS NEEDED.     Note:  This document was prepared using Dragon voice recognition software and may include unintentional dictation errors.    Hinda Kehr, MD 03/22/17 2228

## 2017-03-22 NOTE — Telephone Encounter (Signed)
Patient called very sick with possible diverticulitis and said she needed to be seen today. We did not have any openings and advised her to either go to the ER or to acute walk in.  She will either call for appointment tomorrow if she needs to be seen here.

## 2017-03-22 NOTE — ED Notes (Signed)
Pt states that she has had no pain relief with medication,  EDP notified and VO given for morphine 4mg  IV.

## 2017-03-22 NOTE — ED Triage Notes (Addendum)
States abd pain with nausea and diarrhea and lack of appetite since Thursday, states hx of diverticulitis, states 1 episode of bloody stool, awake and alert in no acute distress

## 2017-03-26 ENCOUNTER — Encounter: Payer: Self-pay | Admitting: Family Medicine

## 2017-03-26 ENCOUNTER — Ambulatory Visit (INDEPENDENT_AMBULATORY_CARE_PROVIDER_SITE_OTHER): Payer: Medicare Other | Admitting: Family Medicine

## 2017-03-26 VITALS — BP 143/85 | HR 91 | Temp 98.5°F | Wt 218.0 lb

## 2017-03-26 DIAGNOSIS — K529 Noninfective gastroenteritis and colitis, unspecified: Secondary | ICD-10-CM

## 2017-03-26 NOTE — Progress Notes (Signed)
BP (!) 143/85   Pulse 91   Temp 98.5 F (36.9 C)   Wt 218 lb (98.9 kg)   SpO2 95%   BMI 39.87 kg/m    Subjective:    Patient ID: Susan Houston, female    DOB: 05-13-1961, 56 y.o.   MRN: [id number]  HPI: Susan Houston is a 56 y.o. female  Chief Complaint  Patient presents with  . Hospitalization Follow-up    Was admitted for colitis. Still on antibiotics. Still not eating good and having diarrhea.    Patient presents for ER f/u for colitis/possible diverticulitis. Currently taking cipro and flagyl, as well as zofran prn. Feeling some better, tolerating PO intake but still having frequent diarrhea. Denies fever, chills, sweats. Hx of diverticulitis.   Relevant past medical, surgical, family and social history reviewed and updated as indicated. Interim medical history since our last visit reviewed. Allergies and medications reviewed and updated.  Review of Systems  Constitutional: Negative.   HENT: Negative.   Respiratory: Negative.   Cardiovascular: Negative.   Gastrointestinal: Positive for diarrhea.  Genitourinary: Negative.   Musculoskeletal: Negative.   Neurological: Negative.   Psychiatric/Behavioral: Negative.     Per HPI unless specifically indicated above     Objective:    BP (!) 143/85   Pulse 91   Temp 98.5 F (36.9 C)   Wt 218 lb (98.9 kg)   SpO2 95%   BMI 39.87 kg/m   Wt Readings from Last 3 Encounters:  03/26/17 218 lb (98.9 kg)  03/22/17 229 lb (103.9 kg)  01/01/17 229 lb 9.6 oz (104.1 kg)    Physical Exam  Constitutional: She is oriented to person, place, and time. She appears well-developed and well-nourished. No distress.  HENT:  Head: Atraumatic.  Eyes: Conjunctivae are normal. Pupils are equal, round, and reactive to light.  Neck: Normal range of motion. Neck supple.  Cardiovascular: Normal rate and normal heart sounds.   Pulmonary/Chest: Effort normal. No respiratory distress.  Abdominal: Soft. Bowel sounds are normal. She exhibits no  distension. There is no tenderness.  Musculoskeletal: Normal range of motion.  Neurological: She is alert and oriented to person, place, and time.  Skin: Skin is warm and dry.  Psychiatric: She has a normal mood and affect. Her behavior is normal.  Nursing note and vitals reviewed.   Results for orders placed or performed during the hospital encounter of 03/22/17  Lipase, blood  Result Value Ref Range   Lipase 24 11 - 51 U/L  Comprehensive metabolic panel  Result Value Ref Range   Sodium 137 135 - 145 mmol/L   Potassium 3.9 3.5 - 5.1 mmol/L   Chloride 105 101 - 111 mmol/L   CO2 24 22 - 32 mmol/L   Glucose, Bld 111 (H) 65 - 99 mg/dL   BUN 10 6 - 20 mg/dL   Creatinine, Ser 0.94 0.44 - 1.00 mg/dL   Calcium 9.4 8.9 - 10.3 mg/dL   Total Protein 8.3 (H) 6.5 - 8.1 g/dL   Albumin 4.2 3.5 - 5.0 g/dL   AST 55 (H) 15 - 41 U/L   ALT 40 14 - 54 U/L   Alkaline Phosphatase 90 38 - 126 U/L   Total Bilirubin 0.9 0.3 - 1.2 mg/dL   GFR calc non Af Amer >60 >60 mL/min   GFR calc Af Amer >60 >60 mL/min   Anion gap 8 5 - 15  CBC  Result Value Ref Range   WBC 7.1 3.6 -  11.0 K/uL   RBC 4.72 3.80 - 5.20 MIL/uL   Hemoglobin 13.5 12.0 - 16.0 g/dL   HCT 40.0 35.0 - 47.0 %   MCV 84.8 80.0 - 100.0 fL   MCH 28.6 26.0 - 34.0 pg   MCHC 33.7 32.0 - 36.0 g/dL   RDW 14.9 (H) 11.5 - 14.5 %   Platelets 239 150 - 440 K/uL      Assessment & Plan:   Problem List Items Addressed This Visit    None    Visit Diagnoses    Colitis    -  Primary   Complete abx, continue zofran prn. Add imodium to help slow down the diarrhea. Push fluids, BRAT diet. Strict return precautions reviewed, pt agreeable.        Follow up plan: Return if symptoms worsen or fail to improve.

## 2017-03-31 ENCOUNTER — Telehealth: Payer: Self-pay | Admitting: Family Medicine

## 2017-03-31 DIAGNOSIS — G894 Chronic pain syndrome: Secondary | ICD-10-CM

## 2017-03-31 DIAGNOSIS — M47816 Spondylosis without myelopathy or radiculopathy, lumbar region: Secondary | ICD-10-CM

## 2017-03-31 DIAGNOSIS — M5137 Other intervertebral disc degeneration, lumbosacral region: Secondary | ICD-10-CM

## 2017-03-31 NOTE — Telephone Encounter (Signed)
Ica called wanting Dr Wynetta Emery to put her in a referral to [facility], she thinks it is Dr Ledora Bottcher   This referral would be for her back.  Thank you  [place] [phone]

## 2017-04-01 NOTE — Telephone Encounter (Signed)
Sending to provider

## 2017-04-09 ENCOUNTER — Ambulatory Visit: Payer: Medicare Other | Admitting: Nurse Practitioner

## 2017-04-12 ENCOUNTER — Encounter: Payer: Self-pay | Admitting: Nurse Practitioner

## 2017-04-20 DIAGNOSIS — Z79899 Other long term (current) drug therapy: Secondary | ICD-10-CM | POA: Diagnosis not present

## 2017-04-27 DIAGNOSIS — M545 Low back pain: Secondary | ICD-10-CM | POA: Diagnosis not present

## 2017-04-27 DIAGNOSIS — G8929 Other chronic pain: Secondary | ICD-10-CM | POA: Diagnosis not present

## 2017-04-29 ENCOUNTER — Telehealth: Payer: Self-pay | Admitting: Family Medicine

## 2017-04-29 NOTE — Telephone Encounter (Signed)
She can definitely get it and I don't think it will hurt her. I doubt insurance will pay for it and I don't know where she can get it.

## 2017-04-29 NOTE — Telephone Encounter (Signed)
Called and left a message for patient letting her know Dr. Durenda Age response.

## 2017-04-29 NOTE — Telephone Encounter (Signed)
Routing to provider  

## 2017-04-29 NOTE — Telephone Encounter (Signed)
Patient called to see if she could get "earing" (needle0 pressure point in her ear for migraines. Patient wants to know if she can get it and where she can get it.  Please Advise.  Thank you

## 2017-04-30 NOTE — Telephone Encounter (Signed)
Pt.notified

## 2017-05-07 ENCOUNTER — Ambulatory Visit: Payer: Medicare Other

## 2017-05-21 ENCOUNTER — Ambulatory Visit: Payer: Medicare Other

## 2017-05-23 ENCOUNTER — Other Ambulatory Visit: Payer: Self-pay | Admitting: Unknown Physician Specialty

## 2017-05-26 ENCOUNTER — Ambulatory Visit (INDEPENDENT_AMBULATORY_CARE_PROVIDER_SITE_OTHER): Payer: Medicare Other | Admitting: Family Medicine

## 2017-05-26 ENCOUNTER — Encounter: Payer: Self-pay | Admitting: Family Medicine

## 2017-05-26 VITALS — BP 106/73 | HR 67 | Temp 97.7°F | Wt 221.4 lb

## 2017-05-26 DIAGNOSIS — G43009 Migraine without aura, not intractable, without status migrainosus: Secondary | ICD-10-CM

## 2017-05-26 DIAGNOSIS — F322 Major depressive disorder, single episode, severe without psychotic features: Secondary | ICD-10-CM | POA: Diagnosis not present

## 2017-05-26 DIAGNOSIS — J441 Chronic obstructive pulmonary disease with (acute) exacerbation: Secondary | ICD-10-CM | POA: Diagnosis not present

## 2017-05-26 MED ORDER — PREDNISONE 10 MG PO TABS: ORAL_TABLET | ORAL | 0 refills | Status: DC

## 2017-05-26 MED ORDER — AZITHROMYCIN 250 MG PO TABS: ORAL_TABLET | ORAL | 0 refills | Status: DC

## 2017-05-26 MED ORDER — BENZONATATE 200 MG PO CAPS: 200.0000 mg | ORAL_CAPSULE | Freq: Two times a day (BID) | ORAL | 0 refills | Status: DC | PRN

## 2017-05-26 NOTE — Progress Notes (Signed)
BP 106/73 (BP Location: Left Arm, Patient Position: Sitting, Cuff Size: Normal)   Pulse 67   Temp 97.7 F (36.5 C)   Wt 221 lb 6 oz (100.4 kg)   SpO2 95%   BMI 40.49 kg/m    Subjective:    Patient ID: ATAVIA POPPE, female    DOB: December 01, 1960, 56 y.o.   MRN: [id number]  HPI: ANAYSIA GERMER is a 56 y.o. female  Chief Complaint  Patient presents with  . Cough  . URI   UPPER RESPIRATORY TRACT INFECTION Duration: 2+ weeks Worst symptom: Hoarseness Fever: yes Cough: yes Shortness of breath: yes Wheezing: yes Chest pain: yes, with cough Chest tightness: yes Chest congestion: no Nasal congestion: yes Runny nose: yes Post nasal drip: yes Sneezing: no Sore throat: yes Swollen glands: no Sinus pressure: no Headache: yes Face pain: no Toothache: no Ear pain: yes- L  Ear pressure: no  Eyes red/itching:no Eye drainage/crusting: no  Vomiting: no Rash: no Fatigue: yes Sick contacts: no Strep contacts: no  Context: worse Recurrent sinusitis: no Relief with OTC cold/cough medications: no  Treatments attempted: cepachol, robutussin    Relevant past medical, surgical, family and social history reviewed and updated as indicated. Interim medical history since our last visit reviewed. Allergies and medications reviewed and updated.  Review of Systems  Constitutional: Negative.   Respiratory: Positive for cough, chest tightness, shortness of breath and wheezing. Negative for apnea, choking and stridor.   Cardiovascular: Negative.   Psychiatric/Behavioral: Negative.     Per HPI unless specifically indicated above     Objective:    BP 106/73 (BP Location: Left Arm, Patient Position: Sitting, Cuff Size: Normal)   Pulse 67   Temp 97.7 F (36.5 C)   Wt 221 lb 6 oz (100.4 kg)   SpO2 95%   BMI 40.49 kg/m   Wt Readings from Last 3 Encounters:  05/26/17 221 lb 6 oz (100.4 kg)  03/26/17 218 lb (98.9 kg)  03/22/17 229 lb (103.9 kg)    Physical Exam  Constitutional: She  is oriented to person, place, and time. She appears well-developed and well-nourished. No distress.  HENT:  Head: Normocephalic and atraumatic.  Right Ear: Hearing and external ear normal.  Left Ear: Hearing normal.  Nose: Nose normal.  Mouth/Throat: Oropharynx is clear and moist. No oropharyngeal exudate.  Eyes: Conjunctivae and lids are normal. Right eye exhibits no discharge. Left eye exhibits no discharge. No scleral icterus.  Neck: Normal range of motion. Neck supple. No JVD present. No tracheal deviation present. No thyromegaly present.  Cardiovascular: Normal rate, regular rhythm, normal heart sounds and intact distal pulses.  Exam reveals no gallop and no friction rub.   No murmur heard. Pulmonary/Chest: Effort normal. No stridor. No respiratory distress. She has wheezes. She has no rales. She exhibits no tenderness.  Musculoskeletal: Normal range of motion.  Lymphadenopathy:    She has no cervical adenopathy.  Neurological: She is alert and oriented to person, place, and time.  Skin: Skin is warm, dry and intact. No rash noted. She is not diaphoretic. No erythema. No pallor.  Psychiatric: She has a normal mood and affect. Her speech is normal and behavior is normal. Judgment and thought content normal. Cognition and memory are normal.  Nursing note and vitals reviewed.   Results for orders placed or performed during the hospital encounter of 03/22/17  Lipase, blood  Result Value Ref Range   Lipase 24 11 - 51 U/L  Comprehensive metabolic panel  Result Value Ref Range   Sodium 137 135 - 145 mmol/L   Potassium 3.9 3.5 - 5.1 mmol/L   Chloride 105 101 - 111 mmol/L   CO2 24 22 - 32 mmol/L   Glucose, Bld 111 (H) 65 - 99 mg/dL   BUN 10 6 - 20 mg/dL   Creatinine, Ser 0.94 0.44 - 1.00 mg/dL   Calcium 9.4 8.9 - 10.3 mg/dL   Total Protein 8.3 (H) 6.5 - 8.1 g/dL   Albumin 4.2 3.5 - 5.0 g/dL   AST 55 (H) 15 - 41 U/L   ALT 40 14 - 54 U/L   Alkaline Phosphatase 90 38 - 126 U/L    Total Bilirubin 0.9 0.3 - 1.2 mg/dL   GFR calc non Af Amer >60 >60 mL/min   GFR calc Af Amer >60 >60 mL/min   Anion gap 8 5 - 15  CBC  Result Value Ref Range   WBC 7.1 3.6 - 11.0 K/uL   RBC 4.72 3.80 - 5.20 MIL/uL   Hemoglobin 13.5 12.0 - 16.0 g/dL   HCT 40.0 35.0 - 47.0 %   MCV 84.8 80.0 - 100.0 fL   MCH 28.6 26.0 - 34.0 pg   MCHC 33.7 32.0 - 36.0 g/dL   RDW 14.9 (H) 11.5 - 14.5 %   Platelets 239 150 - 440 K/uL      Assessment & Plan:   Problem List Items Addressed This Visit      Cardiovascular and Mediastinum   Migraine without aura and without status migrainosus, not intractable    Missed her last appointment with neurology. Rescheduled today for 9/10 at 9:45 AM. Patient made aware.         Other   Severe depression ([place])    Not under good control. Advised her to follow up with her psychiatrist.        Other Visit Diagnoses    COPD with acute exacerbation ([place])    -  Primary   Will treat with 12 day course of prednisone and z-pack. Recheck 2 week. Call with any concerns.    Relevant Medications   predniSONE (DELTASONE) 10 MG tablet   azithromycin (ZITHROMAX) 250 MG tablet   benzonatate (TESSALON) 200 MG capsule       Follow up plan: Return in about 2 weeks (around 06/09/2017) for Lung recheck.

## 2017-05-26 NOTE — Assessment & Plan Note (Signed)
Missed her last appointment with neurology. Rescheduled today for 9/10 at 9:45 AM. Patient made aware.

## 2017-05-26 NOTE — Assessment & Plan Note (Signed)
Not under good control. Advised her to follow up with her psychiatrist.

## 2017-05-27 ENCOUNTER — Telehealth: Payer: Self-pay | Admitting: Neurology

## 2017-05-27 ENCOUNTER — Telehealth: Payer: Self-pay | Admitting: Family Medicine

## 2017-05-27 NOTE — Telephone Encounter (Signed)
Patient called in regards to her medication for Benzonatate. Patient cannot afford medication. Patient would like to get an affordable alternative for medication.  Please Advise.  Thank you

## 2017-05-27 NOTE — Telephone Encounter (Signed)
She can take OTC robutussin. There isn't anything else prescription similar. Thanks.

## 2017-05-27 NOTE — Telephone Encounter (Signed)
Patient notified

## 2017-05-27 NOTE — Telephone Encounter (Signed)
She needs to be seen in the office to discuss. We can offer her first available with megan since I am out of the office. If she has a severe headache she needs to go to the emergency room. thanks

## 2017-05-27 NOTE — Telephone Encounter (Signed)
Patient called office in reference to having 3-4 week migraines.  Patient has no more medication right now pain level being a 10.  Patient could barely speak.  Please call

## 2017-05-27 NOTE — Telephone Encounter (Signed)
Routing to provider  

## 2017-05-27 NOTE — Telephone Encounter (Signed)
Pt called back, said she is in severe pain and needs to speak with RN asap

## 2017-05-27 NOTE — Telephone Encounter (Signed)
Pt was seen once in March for migraines. She was started on Depakote. Has tried amitriptyline, gabapentin, sertraline, tizanidine, Topamax, Imitrex, prednisone and Zofran. She declined Botox and now-showed her 3 mo f/u appt in June. She reported at her PCP appt in June that she was not taking the Depakote.

## 2017-05-28 MED ORDER — SUMATRIPTAN SUCCINATE 100 MG PO TABS: ORAL_TABLET | ORAL | 0 refills | Status: DC

## 2017-05-28 NOTE — Telephone Encounter (Signed)
Called pt who reports ongoing migraine. Appt scheduled w/ Jinny Blossom NP for Mon. Pt requested medication to help get through the weekend. Encouraged her to continue daily topiramate. Refill of Imitrex e-scribed to her verified pharmacy.

## 2017-05-28 NOTE — Addendum Note (Signed)
Addended by: Monte Fantasia on: 05/28/2017 02:00 PM   Modules accepted: Orders

## 2017-05-31 ENCOUNTER — Ambulatory Visit: Payer: Self-pay | Admitting: Adult Health

## 2017-05-31 NOTE — Telephone Encounter (Signed)
Pt calling back stating that the medication that was called in is not what she thought would be called in for her.  She is asking for a call back

## 2017-06-01 ENCOUNTER — Encounter: Payer: Self-pay | Admitting: Adult Health

## 2017-06-01 ENCOUNTER — Other Ambulatory Visit: Payer: Self-pay | Admitting: Neurology

## 2017-06-01 MED ORDER — DICLOFENAC POTASSIUM 50 MG PO TABS: 50.0000 mg | ORAL_TABLET | Freq: Three times a day (TID) | ORAL | 3 refills | Status: DC | PRN

## 2017-06-01 NOTE — Telephone Encounter (Signed)
I called the patient, I left a message, I will call back later.

## 2017-06-01 NOTE — Telephone Encounter (Signed)
Returned pt TC. She says that she was not able to come in for yesterday's appt to discuss med management d/t severe migraines. States that she is not able to fill sumatriptan until 06/06/17 but continues Topamax as ordered. Offered to r/s her appt w/ NP on Thursday this week but she reports that she does not have transportation. Would like to keep her previously scheduled appt w/ Hoyle Sauer NP in Sept.

## 2017-06-01 NOTE — Addendum Note (Signed)
Addended by: Kathrynn Ducking on: 06/01/2017 06:09 PM   Modules accepted: Orders

## 2017-06-01 NOTE — Telephone Encounter (Signed)
I called patient. The patient is having 3 or 4 headaches a week, she is on Imitrex which helps but she only gets 9 tablets a month. She has had difficulty getting over to our office to make her appointments.  I will call in diclofenac potassium 50 mg to take if needed, recent blood work showed good renal function.

## 2017-06-04 ENCOUNTER — Telehealth: Payer: Self-pay

## 2017-06-04 ENCOUNTER — Telehealth: Payer: Self-pay | Admitting: Family Medicine

## 2017-06-04 NOTE — Telephone Encounter (Signed)
Called to reschedule missed AWV, left message for pt to call back.

## 2017-06-04 NOTE — Telephone Encounter (Signed)
[facility] would like a confirmation on if the fax  medication for lidocaine and calcipotriene was received.  For medication calcipotriene I attempted to figure out what Marinus Maw was saying but still could not.  Please Advise.  Thank you

## 2017-06-07 ENCOUNTER — Other Ambulatory Visit: Payer: Self-pay | Admitting: Unknown Physician Specialty

## 2017-06-07 NOTE — Telephone Encounter (Signed)
Spoke to pt to re-schedule AWV for 8/24

## 2017-06-07 NOTE — Telephone Encounter (Signed)
Your patient.  Thanks 

## 2017-06-07 NOTE — Telephone Encounter (Signed)
Called requesting a refill on the following med for her shaking  Tizanidine 4 mg  Susan Houston [phone]  Thank You

## 2017-06-07 NOTE — Telephone Encounter (Signed)
Spoke with patient, she states to not worry about the prescriptions.

## 2017-06-11 ENCOUNTER — Ambulatory Visit: Payer: Medicare Other

## 2017-06-11 ENCOUNTER — Ambulatory Visit: Payer: Medicare Other | Admitting: Family Medicine

## 2017-06-18 ENCOUNTER — Ambulatory Visit: Payer: Medicare Other

## 2017-06-25 ENCOUNTER — Ambulatory Visit: Payer: Self-pay

## 2017-06-25 ENCOUNTER — Telehealth: Payer: Self-pay | Admitting: Family Medicine

## 2017-06-25 NOTE — Telephone Encounter (Signed)
Sent text reminder to pt about AWV this afternoon.

## 2017-06-27 NOTE — Progress Notes (Deleted)
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 Wynetta Emery for migaines. Migraines started in her teen years. She feels like the top of her head has pressure, like her head is being blown off, back of the head throbbing. She stopped taking excedrin. No medication overuse. She has been on medications for years for "mental conditions". She has headaches 2-3x a week. They usually last 8-9 hours. Up to 2-3 days. Out of 30 days she has 15-16 headache days a month and all are migraines for the last year. She has light and sound sensitivity, she has to go into the bedroom and close the blinds. Ongoing for years without change in quality. No medication overuse. Worsened over the last year in frequency. She does not get an aura. She can't stand sound, endorses nausea and vomiting. No other focal neurologic deficits, associated symptoms, inciting events or modifiable factors.  Medications tried: Amitriptyline 10mg , gabapentin, sertraline, tizanidine, topamax, imitrex, prednisone, zofran ***  REVIEW OF SYSTEMS: Full 14 system review of systems performed and notable only for those listed, all others are neg:  Constitutional: neg  Cardiovascular: neg Ear/Nose/Throat: neg  Skin: neg Eyes: neg Respiratory: neg Gastroitestinal: neg  Hematology/Lymphatic: neg  Endocrine: neg Musculoskeletal:neg Allergy/Immunology: neg Neurological: neg Psychiatric: neg Sleep : neg   ALLERGIES: Allergies  Allergen Reactions  . Lexapro [Escitalopram Oxalate] Other (See Comments)    Keeps awake for days.   . Morphine And Related Itching    HOME MEDICATIONS: Outpatient Medications Prior to Visit  Medication Sig Dispense Refill  . aspirin 81 MG tablet Take 81 mg by mouth daily.    Marland Kitchen azithromycin (ZITHROMAX) 250 MG tablet 2 tabs today and 1  daily for 4 day 6 tablet 0  . benzonatate (TESSALON) 200 MG capsule Take 1 capsule (200 mg total) by mouth 2 (two) times daily as needed for cough. 20 capsule 0  . busPIRone (BUSPAR) 15 MG tablet Take 15 mg by mouth daily.    . diclofenac (CATAFLAM) 50 MG tablet TAKE 1 TABLET(50 MG) BY MOUTH THREE TIMES DAILY AS NEEDED 270 tablet 3  . docusate sodium (COLACE) 100 MG capsule Take 1 tablet once or twice daily as needed for constipation while taking narcotic pain medicine (Patient not taking: Reported on 03/26/2017) 30 capsule 0  . doxepin (SINEQUAN) 50 MG capsule TK 1 OR 2 CS PO QHS PRN  4  . fluticasone (FLONASE) 50 MCG/ACT nasal spray INSTILL 2 SPRAYS IEN QD  12  . Fluticasone-Salmeterol (ADVAIR DISKUS) 250-50 MCG/DOSE AEPB Inhale 1 puff into the lungs 2 (two) times daily. 60 each 12  . gabapentin (NEURONTIN) 300 MG capsule Take 300 mg by mouth 3 (three) times daily.    Marland Kitchen ipratropium-albuterol (DUONEB) 0.5-2.5 (3) MG/3ML SOLN Take 3 mLs by nebulization every 4 (four) hours as needed. 780 mL 3  . LORazepam (ATIVAN) 0.5 MG tablet Take by mouth. 2 tablets at bedtime Reported on 02/26/2016    . omeprazole (PRILOSEC) 40 MG capsule Take 1 capsule (40 mg total) by mouth daily. 90 capsule 0  . ondansetron (ZOFRAN ODT) 4 MG disintegrating tablet Allow 1-2 tablets to dissolve in your mouth every 8 hours as needed for nausea/vomiting 30 tablet 0  . oxyCODONE-acetaminophen (ROXICET) 5-325 MG tablet Take 1-2 tablets by mouth every 4 (four) hours  as needed for severe pain. (Patient not taking: Reported on 03/26/2017) 20 tablet 0  . predniSONE (DELTASONE) 10 MG tablet 6 tabs today and tomorrow, 5 tabs for 2 days, then 4, decrease by 1 every other day until gone 42 tablet 0  . sertraline (ZOLOFT) 50 MG tablet Take 50 mg by mouth daily.    . SUMAtriptan (IMITREX) 100 MG tablet TAKE 1 TAB BY MOUTH AT START OF MIGRAINE. MAY REPEAT IN 2 HOURS IF HEADACHE PERSISTS OR RECURS. MAX: 2 TABS IN 24 HRS. 10 tablet 0  . tiZANidine  (ZANAFLEX) 4 MG tablet TAKE 1 TABLET BY MOUTH THREE TIMES DAILY AS NEEDED. 270 tablet 0  . topiramate (TOPAMAX) 100 MG tablet TAKE 1 TABLET(100 MG) BY MOUTH TWICE DAILY 60 tablet 6  . traZODone (DESYREL) 150 MG tablet Take 150 mg by mouth daily.     Facility-Administered Medications Prior to Visit  Medication Dose Route Frequency Provider Last Rate Last Dose  . albuterol (PROVENTIL) (2.5 MG/3ML) 0.083% nebulizer solution 2.5 mg  2.5 mg Nebulization Once Johnson, Megan P, DO      . bupivacaine (PF) (MARCAINE) 0.25 % injection 30 mL  30 mL Other Once Mohammed Kindle, MD        PAST MEDICAL HISTORY: Past Medical History:  Diagnosis Date  . Anxiety   . Arthritis   . Asthma   . Back pain   . Bruises easily   . 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 Chronic leg pain    BILATERAL  . COPD (chronic obstructive pulmonary disease) ([place])   . Depression   . Difficulty sleeping   . Diverticulitis   . Hemorrhoids   . History of kidney stones   . Hyperlipidemia   . Hypertension   . Incontinence of urine   . Migraine   . MRSA infection greater than 3 months ago    2008 right side of face  . Nerve damage    right leg    PAST SURGICAL HISTORY: Past Surgical History:  Procedure Laterality Date  . ABDOMINAL HYSTERECTOMY  1983  . BLADDER SUSPENSION  2014  . CHOLECYSTECTOMY    . COLONOSCOPY    . ESOPHAGOGASTRODUODENOSCOPY    . ESOPHAGOGASTRODUODENOSCOPY (EGD) WITH PROPOFOL N/A 11/20/2016   Procedure: ESOPHAGOGASTRODUODENOSCOPY (EGD) WITH PROPOFOL;  Surgeon: Jonathon Bellows, MD;  Location: [facility] ENDOSCOPY;  Service: Endoscopy;  Laterality: N/A;  . EXCISION NEUROMA Bilateral 09/24/2014   Procedure: BILATERAL OPEN SAPHENOUS NEURECTOMIES AND OPEN LEFT PERIPATELLA OSETOPHYTECTOMY;  Surgeon: Mauri Pole, MD;  Location: [facility];  Service: Orthopedics;  Laterality: Bilateral;  . JOINT REPLACEMENT  2009/2010   BIL TOTAL KNEES  . TONSILLECTOMY      FAMILY  HISTORY: Family History  Problem Relation Age of Onset  . Asthma Mother   . Diabetes Mother   . Hyperlipidemia Mother   . Hypertension Mother   . Cirrhosis Mother        Non-alcoholic  . Arthritis Father   . Cancer Father        Bone  . Hyperlipidemia Father   . Hypertension Father   . Kidney Stones Son   . Stroke Maternal Grandmother   . Cancer Maternal Grandfather        lung  . Pneumonia Paternal Grandmother   . Alzheimer's disease Paternal Grandfather   . Diabetes Sister   . Gout Sister   . Hypertension Sister   . Alcohol abuse Brother   .  Heart disease Brother        massive MI    SOCIAL HISTORY: Social History   Social History  . Marital status: Married    Spouse name: Tharon Aquas  . Number of children: 2  . Years of education: 12   Occupational History  . Disabled    Social History Main Topics  . Smoking status: Former Smoker    Packs/day: 0.50    Types: Cigarettes    Quit date: 04/07/2016  . Smokeless tobacco: Never Used     Comment: patient has not smoked x 10 days.  . Alcohol use No  . Drug use: No  . Sexual activity: No   Other Topics Concern  . Not on file   Social History Narrative   Lives at home w/ her husband   Right-handed   Caffeine: occasionally     PHYSICAL EXAM  There were no vitals filed for this visit. There is no height or weight on file to calculate BMI.  Generalized: Well developed, in no acute distress  Head: normocephalic and atraumatic,. Oropharynx benign  Neck: Supple, no carotid bruits  Cardiac: Regular rate rhythm, no murmur  Musculoskeletal: No deformity   Neurological examination   Mentation: Alert oriented to time, place, history taking. Attention span and concentration appropriate. Recent and remote memory intact.  Follows all commands speech and language fluent.   Cranial nerve II-XII: Fundoscopic exam reveals sharp disc margins.Pupils were equal round reactive to light extraocular movements were full, visual  field were full on confrontational test. Facial sensation and strength were normal. hearing was intact to finger rubbing bilaterally. Uvula tongue midline. head turning and shoulder shrug were normal and symmetric.Tongue protrusion into cheek strength was normal. Motor: normal bulk and tone, full strength in the BUE, BLE, fine finger movements normal, no pronator drift. No focal weakness Sensory: normal and symmetric to light touch, pinprick, and  Vibration, proprioception  Coordination: finger-nose-finger, heel-to-shin bilaterally, no dysmetria Reflexes: Brachioradialis 2/2, biceps 2/2, triceps 2/2, patellar 2/2, Achilles 2/2, plantar responses were flexor bilaterally. Gait and Station: Rising up from seated position without assistance, normal stance,  moderate stride, good arm swing, smooth turning, able to perform tiptoe, and heel walking without difficulty. Tandem gait is steady  DIAGNOSTIC DATA (LABS, IMAGING, TESTING) - I reviewed patient records, labs, notes, testing and imaging myself where available.  Lab Results  Component Value Date   WBC 7.1 03/22/2017   HGB 13.5 03/22/2017   HCT 40.0 03/22/2017   MCV 84.8 03/22/2017   PLT 239 03/22/2017      Component Value Date/Time   NA 137 03/22/2017 1443   NA 141 07/10/2016 1521   NA 137 05/14/2014 1457   K 3.9 03/22/2017 1443   K 3.2 (L) 05/14/2014 1457   CL 105 03/22/2017 1443   CL 103 05/14/2014 1457   CO2 24 03/22/2017 1443   CO2 28 05/14/2014 1457   GLUCOSE 111 (H) 03/22/2017 1443   GLUCOSE 80 05/14/2014 1457   BUN 10 03/22/2017 1443   BUN 17 07/10/2016 1521   BUN 8 05/14/2014 1457   CREATININE 0.94 03/22/2017 1443   CREATININE 0.79 05/14/2014 1457   CALCIUM 9.4 03/22/2017 1443   CALCIUM 8.9 05/14/2014 1457   PROT 8.3 (H) 03/22/2017 1443   PROT 7.0 07/10/2016 1521   PROT 7.0 05/14/2014 1457   ALBUMIN 4.2 03/22/2017 1443   ALBUMIN 4.1 07/10/2016 1521   ALBUMIN 3.3 (L) 05/14/2014 1457   AST 55 (H) 03/22/2017 1443  AST  9 (L) 05/14/2014 1457   ALT 40 03/22/2017 1443   ALT 15 05/14/2014 1457   ALKPHOS 90 03/22/2017 1443   ALKPHOS 87 05/14/2014 1457   BILITOT 0.9 03/22/2017 1443   BILITOT <0.2 07/10/2016 1521   BILITOT 0.2 05/14/2014 1457   GFRNONAA >60 03/22/2017 1443   GFRNONAA >60 05/14/2014 1457   GFRAA >60 03/22/2017 1443   GFRAA >60 05/14/2014 1457   Lab Results  Component Value Date   CHOL 164 07/10/2016   HDL 45 07/10/2016   LDLCALC 87 07/10/2016   TRIG 160 (H) 07/10/2016   No results found for: HGBA1C No results found for: VITAMINB12 Lab Results  Component Value Date   TSH 3.16 05/21/2014    ***  ASSESSMENT AND PLAN  56 y.o. year old female  has a past medical history of Anxiety; Arthritis; Asthma; Back pain; Bruises easily; Cancer [facility]) (2009); Chronic leg pain; COPD (chronic obstructive pulmonary disease) ([place]); Depression; Difficulty sleeping; Diverticulitis; Hemorrhoids; History of kidney stones; Hyperlipidemia; Hypertension; Incontinence of urine; Migraine; MRSA infection greater than 3 months ago; and Nerve damage. here with ***  56 y.o. female here as a referral from Dr. Wynetta Emery for migaines. Migraines started in her teen years. She has 15-16 migraine days a month and has failed multiple medications. Will try Depakote. Declined Botox for migraines. Discussed side effects as per patient instructions. RTC 3 months.   Discussed: To prevent or relieve headaches, try the following:  Cool Compress. Lie down and place a cool compress on your head.   Avoid headache triggers. If certain foods or odors seem to have triggered your migraines in the past, avoid them. A headache diary might help you identify triggers.   Include physical activity in your daily routine. Try a daily walk or other moderate aerobic exercise.   Manage stress. Find healthy ways to cope with the stressors, such as delegating tasks on your to-do list.   Practice relaxation techniques. Try deep breathing, yoga,  massage and visualization.   Eat regularly. Eating regularly scheduled meals and maintaining a healthy diet might help prevent headaches. Also, drink plenty of fluids.   Follow a regular sleep schedule. Sleep deprivation might contribute to headaches  Consider biofeedback. With this mind-body technique, you learn to control certain bodily functions - such as muscle tension, heart rate and blood pressure - to prevent headaches or reduce headache pain.   Dennie Bible, [facility], [facility], APRN  [facility] [address] [place], [place] [address] [phone]

## 2017-06-28 ENCOUNTER — Ambulatory Visit: Payer: Medicare Other | Admitting: Nurse Practitioner

## 2017-06-28 ENCOUNTER — Encounter: Payer: Self-pay | Admitting: Nurse Practitioner

## 2017-06-28 ENCOUNTER — Ambulatory Visit (INDEPENDENT_AMBULATORY_CARE_PROVIDER_SITE_OTHER): Payer: Medicare Other | Admitting: Nurse Practitioner

## 2017-06-28 VITALS — BP 120/81 | HR 68 | Wt 224.0 lb

## 2017-06-28 DIAGNOSIS — G43009 Migraine without aura, not intractable, without status migrainosus: Secondary | ICD-10-CM | POA: Diagnosis not present

## 2017-06-28 MED ORDER — DIVALPROEX SODIUM ER 250 MG PO TB24: ORAL_TABLET | ORAL | 4 refills | Status: DC

## 2017-06-28 NOTE — Progress Notes (Addendum)
[facility]  PATIENT: Susan Houston DOB: [birth year]   REASON FOR VISIT: Follow-up for migraines HISTORY FROM: Patient    HISTORY OF PRESENT ILLNESS: Susan Houston is a 56 y.o. female here as a referral from Dr. Wynetta Emery for migaines. Migraines started in her teen years. She feels like the top of her head has pressure, like her head is being blown off, back of the head throbbing. She stopped taking excedrin. No medication overuse. She has been on medications for years for "mental conditions". She has headaches 2-3x a week. They usually last 8-9 hours. Up to 2-3 days. Out of 30 days she has 15-16 headache days a month and all are migraines for the last year. She has light and sound sensitivity, she has to go into the bedroom and close the blinds. Ongoing for years without change in quality. No medication overuse. Worsened over the last year in frequency. She does not get an aura. She can't stand sound, endorses nausea and vomiting. No other focal neurologic deficits, associated symptoms, inciting events or modifiable factors.  Medications tried: Amitriptyline 10mg , gabapentin, sertraline, tizanidine, topamax, imitrex, prednisone, zofran  UPDATE 09/10/2018CM Susan Houston, 56 year old female returns for follow-up with a history of migraines. When last seen by Dr. Jaynee Eagles in March she was placed on Depakote 250 mg to titrate however the patient had several no-show appointments and she ran out of the medication. She also has some diclofenac called in by Dr. Jannifer Franklin however she says it is not beneficial. She has Imitrex to take acutely. She is also on Topamax  Thru  psychiatry Dr. Halford Chessman . She is also on tizanidine and Neurontin. She claims she is now having 16-17 headaches per month. She does not drive and has trouble getting to her appointments. She returns for reevaluation    REVIEW OF SYSTEMS: Full 14 system review of systems performed and notable only for those listed, all others are neg:    Constitutional: Fatigue  Cardiovascular: neg Ear/Nose/Throat: neg  Skin: neg Eyes: neg Respiratory: neg Gastroitestinal: neg  Hematology/Lymphatic: Easy bruising Endocrine: Excessive thirst Musculoskeletal:neg Allergy/Immunology: neg Neurological: Headache Psychiatric: Depression seen by psychiatry Sleep : neg   ALLERGIES: Allergies  Allergen Reactions  . Lexapro [Escitalopram Oxalate] Other (See Comments)    Keeps awake for days.   . Morphine And Related Itching    HOME MEDICATIONS: Outpatient Medications Prior to Visit  Medication Sig Dispense Refill  . aspirin 81 MG tablet Take 81 mg by mouth daily.    . busPIRone (BUSPAR) 15 MG tablet Take 15 mg by mouth daily.    Marland Kitchen doxepin (SINEQUAN) 50 MG capsule TK 1 OR 2 CS PO QHS PRN  4  . fluticasone (FLONASE) 50 MCG/ACT nasal spray INSTILL 2 SPRAYS IEN QD  12  . Fluticasone-Salmeterol (ADVAIR DISKUS) 250-50 MCG/DOSE AEPB Inhale 1 puff into the lungs 2 (two) times daily. 60 each 12  . gabapentin (NEURONTIN) 300 MG capsule Take 300 mg by mouth 3 (three) times daily.    Marland Kitchen ipratropium-albuterol (DUONEB) 0.5-2.5 (3) MG/3ML SOLN Take 3 mLs by nebulization every 4 (four) hours as needed. 780 mL 3  . LORazepam (ATIVAN) 0.5 MG tablet Take by mouth. 2 tablets at bedtime Reported on 02/26/2016    . sertraline (ZOLOFT) 50 MG tablet Take 50 mg by mouth daily.    . SUMAtriptan (IMITREX) 100 MG tablet TAKE 1 TAB BY MOUTH AT START OF MIGRAINE. MAY REPEAT IN 2 HOURS IF HEADACHE PERSISTS OR RECURS. MAX: 2  TABS IN 24 HRS. 10 tablet 0  . tiZANidine (ZANAFLEX) 4 MG tablet TAKE 1 TABLET BY MOUTH THREE TIMES DAILY AS NEEDED. 270 tablet 0  . topiramate (TOPAMAX) 100 MG tablet TAKE 1 TABLET(100 MG) BY MOUTH TWICE DAILY 60 tablet 6  . traZODone (DESYREL) 150 MG tablet Take 150 mg by mouth daily.    . diclofenac (CATAFLAM) 50 MG tablet TAKE 1 TABLET(50 MG) BY MOUTH THREE TIMES DAILY AS NEEDED (Patient not taking: Reported on 06/28/2017) 270 tablet 3  .  docusate sodium (COLACE) 100 MG capsule Take 1 tablet once or twice daily as needed for constipation while taking narcotic pain medicine (Patient not taking: Reported on 03/26/2017) 30 capsule 0  . omeprazole (PRILOSEC) 40 MG capsule Take 1 capsule (40 mg total) by mouth daily. 90 capsule 0  . ondansetron (ZOFRAN ODT) 4 MG disintegrating tablet Allow 1-2 tablets to dissolve in your mouth every 8 hours as needed for nausea/vomiting (Patient not taking: Reported on 06/28/2017) 30 tablet 0  . oxyCODONE-acetaminophen (ROXICET) 5-325 MG tablet Take 1-2 tablets by mouth every 4 (four) hours as needed for severe pain. (Patient not taking: Reported on 03/26/2017) 20 tablet 0  . azithromycin (ZITHROMAX) 250 MG tablet 2 tabs today and 1 daily for 4 day 6 tablet 0  . benzonatate (TESSALON) 200 MG capsule Take 1 capsule (200 mg total) by mouth 2 (two) times daily as needed for cough. 20 capsule 0  . predniSONE (DELTASONE) 10 MG tablet 6 tabs today and tomorrow, 5 tabs for 2 days, then 4, decrease by 1 every other day until gone 42 tablet 0   Facility-Administered Medications Prior to Visit  Medication Dose Route Frequency Provider Last Rate Last Dose  . albuterol (PROVENTIL) (2.5 MG/3ML) 0.083% nebulizer solution 2.5 mg  2.5 mg Nebulization Once Johnson, Megan P, DO      . bupivacaine (PF) (MARCAINE) 0.25 % injection 30 mL  30 mL Other Once Mohammed Kindle, MD        PAST MEDICAL HISTORY: Past Medical History:  Diagnosis Date  . Anxiety   . Arthritis   . Asthma   . Back pain   . Bruises easily   . Cancer ([place]) 2009   LARYNX CANCER - CHEMO / RADIATION (NO SURGERY)   . Chronic leg pain    BILATERAL  . COPD (chronic obstructive pulmonary disease) ([place])   . Depression   . Difficulty sleeping   . Diverticulitis   . Hemorrhoids   . History of kidney stones   . Hyperlipidemia   . Hypertension   . Incontinence of urine   . Migraine   . MRSA infection greater than 3 months ago    2008 right side of face    . Nerve damage    right leg    PAST SURGICAL HISTORY: Past Surgical History:  Procedure Laterality Date  . ABDOMINAL HYSTERECTOMY  1983  . BLADDER SUSPENSION  2014  . CHOLECYSTECTOMY    . COLONOSCOPY    . ESOPHAGOGASTRODUODENOSCOPY    . ESOPHAGOGASTRODUODENOSCOPY (EGD) WITH PROPOFOL N/A 11/20/2016   Procedure: ESOPHAGOGASTRODUODENOSCOPY (EGD) WITH PROPOFOL;  Surgeon: Jonathon Bellows, MD;  Location: [facility] ENDOSCOPY;  Service: Endoscopy;  Laterality: N/A;  . EXCISION NEUROMA Bilateral 09/24/2014   Procedure: BILATERAL OPEN SAPHENOUS NEURECTOMIES AND OPEN LEFT PERIPATELLA OSETOPHYTECTOMY;  Surgeon: Mauri Pole, MD;  Location: [facility];  Service: Orthopedics;  Laterality: Bilateral;  . JOINT REPLACEMENT  2009/2010   BIL TOTAL KNEES  . TONSILLECTOMY  FAMILY HISTORY: Family History  Problem Relation Age of Onset  . Asthma Mother   . Diabetes Mother   . Hyperlipidemia Mother   . Hypertension Mother   . Cirrhosis Mother        Non-alcoholic  . Arthritis Father   . Cancer Father        Bone  . Hyperlipidemia Father   . Hypertension Father   . Kidney Stones Son   . Stroke Maternal Grandmother   . Cancer Maternal Grandfather        lung  . Pneumonia Paternal Grandmother   . Alzheimer's disease Paternal Grandfather   . Diabetes Sister   . Gout Sister   . Hypertension Sister   . Alcohol abuse Brother   . Heart disease Brother        massive MI    SOCIAL HISTORY: Social History   Social History  . Marital status: Married    Spouse name: Tharon Aquas  . Number of children: 2  . Years of education: 12   Occupational History  . Disabled    Social History Main Topics  . Smoking status: Former Smoker    Packs/day: 0.50    Types: Cigarettes    Quit date: 04/07/2016  . Smokeless tobacco: Never Used     Comment: patient has not smoked x 10 days.06/28/17 chantix  . Alcohol use No  . Drug use: No  . Sexual activity: No   Other Topics Concern  . Not on file   Social History  Narrative   Lives at home w/ her husband   Right-handed   Caffeine: occasionally     PHYSICAL EXAM  Vitals:   06/28/17 1407  BP: 120/81  Pulse: 68  Weight: 224 lb (101.6 kg)   Body mass index is 40.97 kg/m.  Generalized: Well developed, Obese female in no acute distress  Head: normocephalic and atraumatic,. Oropharynx benign  Neck: Supple,  Musculoskeletal: No deformity   Neurological examination   Mentation: Alert oriented to time, place, history taking. Attention span and concentration appropriate. Recent and remote memory intact.  Follows all commands speech and language fluent.   Cranial nerve II-XII: Pupils were equal round reactive to light extraocular movements were full, visual field were full on confrontational test. Facial sensation and strength were normal. hearing was intact to finger rubbing bilaterally. Uvula tongue midline. head turning and shoulder shrug were normal and symmetric.Tongue protrusion into cheek strength was normal. Motor: normal bulk and tone, full strength in the BUE, BLE, fine finger movements normal,  Sensory: normal and symmetric to light touch, in the upper and lower extremities  Coordination: finger-nose-finger, heel-to-shin bilaterally, no dysmetria Reflexes: Symmetric upper and lower, except absent ankle jerks plantar responses were flexor bilaterally. Gait and Station: Rising up from seated position without assistance,antalgic gait with single point cane  DIAGNOSTIC DATA (LABS, IMAGING, TESTING) - I reviewed patient records, labs, notes, testing and imaging myself where available.  Lab Results  Component Value Date   WBC 7.1 03/22/2017   HGB 13.5 03/22/2017   HCT 40.0 03/22/2017   MCV 84.8 03/22/2017   PLT 239 03/22/2017      Component Value Date/Time   NA 137 03/22/2017 1443   NA 141 07/10/2016 1521   NA 137 05/14/2014 1457   K 3.9 03/22/2017 1443   K 3.2 (L) 05/14/2014 1457   CL 105 03/22/2017 1443   CL 103 05/14/2014 1457     CO2 24 03/22/2017 1443   CO2 28 05/14/2014 1457  GLUCOSE 111 (H) 03/22/2017 1443   GLUCOSE 80 05/14/2014 1457   BUN 10 03/22/2017 1443   BUN 17 07/10/2016 1521   BUN 8 05/14/2014 1457   CREATININE 0.94 03/22/2017 1443   CREATININE 0.79 05/14/2014 1457   CALCIUM 9.4 03/22/2017 1443   CALCIUM 8.9 05/14/2014 1457   PROT 8.3 (H) 03/22/2017 1443   PROT 7.0 07/10/2016 1521   PROT 7.0 05/14/2014 1457   ALBUMIN 4.2 03/22/2017 1443   ALBUMIN 4.1 07/10/2016 1521   ALBUMIN 3.3 (L) 05/14/2014 1457   AST 55 (H) 03/22/2017 1443   AST 9 (L) 05/14/2014 1457   ALT 40 03/22/2017 1443   ALT 15 05/14/2014 1457   ALKPHOS 90 03/22/2017 1443   ALKPHOS 87 05/14/2014 1457   BILITOT 0.9 03/22/2017 1443   BILITOT <0.2 07/10/2016 1521   BILITOT 0.2 05/14/2014 1457   GFRNONAA >60 03/22/2017 1443   GFRNONAA >60 05/14/2014 1457   GFRAA >60 03/22/2017 1443   GFRAA >60 05/14/2014 1457   Lab Results  Component Value Date   CHOL 164 07/10/2016   HDL 45 07/10/2016   LDLCALC 87 07/10/2016   TRIG 160 (H) 07/10/2016    Lab Results  Component Value Date   TSH 3.16 05/21/2014      ASSESSMENT AND PLAN 56 y.o. female here to follow-up for  migaines. Migraines started in her teen years. She has 15-16 migraine days a month and has failed multiple medications. Will retry try Depakote.Patient ran out of RX due to NS appts.   Marland Kitchen    PLAN: Restart Depakote 250 mg for 2 weeks then increase to 500 mg Continue Topamax 100 mg twice daily Continue Imitrex acutely Continue to keep a record of your headaches and return to see Dr. Jaynee Eagles in 3 months to discuss [place], [facility], [facility], [facility] [address] [place], [place] [address] [phone]  Personally  participated in, made any corrections needed, and agree with history, physical, neuro exam,assessment and plan as stated above.     Sarina Ill, MD [facility]

## 2017-06-28 NOTE — Patient Instructions (Signed)
Restart Depakote 250 mg for 2 weeks then increase to 500 mg Continue Topamax 100 mg twice daily Continue Imitrex acutely Continue to keep a record of your headaches and return to see Dr. Jaynee Eagles in 3 months to discuss Botox

## 2017-07-05 ENCOUNTER — Other Ambulatory Visit: Payer: Self-pay | Admitting: Neurology

## 2017-07-19 DIAGNOSIS — M21541 Acquired clubfoot, right foot: Secondary | ICD-10-CM | POA: Diagnosis not present

## 2017-08-28 ENCOUNTER — Encounter: Payer: Self-pay | Admitting: Emergency Medicine

## 2017-08-28 ENCOUNTER — Other Ambulatory Visit: Payer: Self-pay

## 2017-08-28 ENCOUNTER — Emergency Department
Admission: EM | Admit: 2017-08-28 | Discharge: 2017-08-28 | Disposition: A | Discharge status: Home / Self Care | Payer: Medicare Other | Attending: Emergency Medicine | Admitting: Emergency Medicine

## 2017-08-28 ENCOUNTER — Emergency Department: Payer: Medicare Other

## 2017-08-28 DIAGNOSIS — I10 Essential (primary) hypertension: Secondary | ICD-10-CM | POA: Diagnosis not present

## 2017-08-28 DIAGNOSIS — J449 Chronic obstructive pulmonary disease, unspecified: Secondary | ICD-10-CM | POA: Diagnosis not present

## 2017-08-28 DIAGNOSIS — S51011A Laceration without foreign body of right elbow, initial encounter: Secondary | ICD-10-CM | POA: Insufficient documentation

## 2017-08-28 DIAGNOSIS — W010XXA Fall on same level from slipping, tripping and stumbling without subsequent striking against object, initial encounter: Secondary | ICD-10-CM | POA: Insufficient documentation

## 2017-08-28 DIAGNOSIS — Y92481 Parking lot as the place of occurrence of the external cause: Secondary | ICD-10-CM | POA: Insufficient documentation

## 2017-08-28 DIAGNOSIS — Z87891 Personal history of nicotine dependence: Secondary | ICD-10-CM | POA: Insufficient documentation

## 2017-08-28 DIAGNOSIS — S51811A Laceration without foreign body of right forearm, initial encounter: Secondary | ICD-10-CM | POA: Diagnosis not present

## 2017-08-28 DIAGNOSIS — Y999 Unspecified external cause status: Secondary | ICD-10-CM | POA: Diagnosis not present

## 2017-08-28 DIAGNOSIS — Y939 Activity, unspecified: Secondary | ICD-10-CM | POA: Insufficient documentation

## 2017-08-28 DIAGNOSIS — Z23 Encounter for immunization: Secondary | ICD-10-CM | POA: Diagnosis not present

## 2017-08-28 DIAGNOSIS — Z8521 Personal history of malignant neoplasm of larynx: Secondary | ICD-10-CM | POA: Insufficient documentation

## 2017-08-28 DIAGNOSIS — J45909 Unspecified asthma, uncomplicated: Secondary | ICD-10-CM | POA: Insufficient documentation

## 2017-08-28 DIAGNOSIS — Z79899 Other long term (current) drug therapy: Secondary | ICD-10-CM | POA: Diagnosis not present

## 2017-08-28 DIAGNOSIS — S59901A Unspecified injury of right elbow, initial encounter: Secondary | ICD-10-CM | POA: Diagnosis present

## 2017-08-28 MED ORDER — OXYCODONE HCL 5 MG PO TABS
5.0000 mg | ORAL_TABLET | Freq: Once | ORAL | Status: AC
  Administered 2017-08-28: 5 mg via ORAL
  Filled 2017-08-28: qty 1

## 2017-08-28 MED ORDER — BACITRACIN ZINC 500 UNIT/GM EX OINT
1.0000 "application " | TOPICAL_OINTMENT | Freq: Once | CUTANEOUS | Status: AC
  Administered 2017-08-28: 1 via TOPICAL
  Filled 2017-08-28: qty 0.9

## 2017-08-28 MED ORDER — LIDOCAINE HCL (PF) 1 % IJ SOLN
10.0000 mL | Freq: Once | INTRAMUSCULAR | Status: AC
  Administered 2017-08-28: 10 mL via INTRADERMAL
  Filled 2017-08-28: qty 10

## 2017-08-28 MED ORDER — LIDOCAINE HCL (PF) 1 % IJ SOLN
INTRAMUSCULAR | Status: AC
  Administered 2017-08-28: 10 mL
  Filled 2017-08-28: qty 5

## 2017-08-28 MED ORDER — TETANUS-DIPHTH-ACELL PERTUSSIS 5-2.5-18.5 LF-MCG/0.5 IM SUSP
0.5000 mL | Freq: Once | INTRAMUSCULAR | Status: AC
  Administered 2017-08-28: 0.5 mL via INTRAMUSCULAR
  Filled 2017-08-28: qty 0.5

## 2017-08-28 MED ORDER — LIDOCAINE HCL (PF) 1 % IJ SOLN
INTRAMUSCULAR | Status: AC
  Administered 2017-08-28: 5 mL
  Filled 2017-08-28: qty 5

## 2017-08-28 NOTE — ED Provider Notes (Signed)
[facility] Emergency Department Provider Note ____________________________________________  Time seen: Approximately 11:20 AM  I have reviewed the triage vital signs and the nursing notes.   HISTORY  Chief Complaint Laceration    HPI Susan Houston is a 56 y.o. female who presents to the emergency department after sustaining a mechanical, non-syncopal fall in the parking lot of a local restaurant. She fell onto the cement parking lot and hit her right elbow creating a large laceration. She is unsure if her tetanus shot is up-to-date.  Past Medical History:  Diagnosis Date  . Anxiety   . Arthritis   . Asthma   . Back pain   . Bruises easily   . Cancer ([place]) 2009   LARYNX CANCER - CHEMO / RADIATION (NO SURGERY)   . Chronic leg pain    BILATERAL  . COPD (chronic obstructive pulmonary disease) ([place])   . Depression   . Difficulty sleeping   . Diverticulitis   . Hemorrhoids   . History of kidney stones   . Hyperlipidemia   . Hypertension   . Incontinence of urine   . Migraine   . MRSA infection greater than 3 months ago    2008 right side of face  . Nerve damage    right leg    Patient Active Problem List   Diagnosis Date Noted  . Migraine without aura and without status migrainosus, not intractable 09/11/2016  . Hyperlipidemia   . COPD (chronic obstructive pulmonary disease) ([place])   . Chronic pain 12/24/2015  . Severe depression ([place]) 12/24/2015  . Chronic anxiety 12/24/2015  . Coarse tremors 12/24/2015  . Facet syndrome, lumbar 03/28/2015  . DDD (degenerative disc disease), lumbar 03/05/2015  . DDD (degenerative disc disease), lumbosacral 02/23/2015  . Sacroiliac joint dysfunction 02/23/2015  . Saphenous neuralgia 09/24/2014    Past Surgical History:  Procedure Laterality Date  . ABDOMINAL HYSTERECTOMY  1983  . BLADDER SUSPENSION  2014  . CHOLECYSTECTOMY    . COLONOSCOPY    . ESOPHAGOGASTRODUODENOSCOPY    . JOINT REPLACEMENT   2009/2010   BIL TOTAL KNEES  . TONSILLECTOMY      Prior to Admission medications   Medication Sig Start Date End Date Taking? Authorizing Provider  aspirin 81 MG tablet Take 81 mg by mouth daily.    [provider]  busPIRone (BUSPAR) 15 MG tablet Take 15 mg by mouth daily. 07/23/16   [provider]  CHANTIX 1 MG tablet  06/09/17   [provider]  diclofenac (CATAFLAM) 50 MG tablet TAKE 1 TABLET(50 MG) BY MOUTH THREE TIMES DAILY AS NEEDED Patient not taking: Reported on 06/28/2017 06/02/17   Kathrynn Ducking, MD  divalproex (DEPAKOTE ER) 250 MG 24 hr tablet 1 po hs for 2 weeks then increase to 2 tabs daily. Stay on medication until f/u appt 06/28/17   Dennie Bible, NP  docusate sodium (COLACE) 100 MG capsule Take 1 tablet once or twice daily as needed for constipation while taking narcotic pain medicine Patient not taking: Reported on 03/26/2017 03/22/17   Hinda Kehr, MD  doxepin (SINEQUAN) 50 MG capsule TK 1 OR 2 CS PO QHS PRN 01/23/16   [provider]  fluticasone (FLONASE) 50 MCG/ACT nasal spray INSTILL 2 SPRAYS IEN QD 12/25/16   [provider]  Fluticasone-Salmeterol (ADVAIR DISKUS) 250-50 MCG/DOSE AEPB Inhale 1 puff into the lungs 2 (two) times daily. 06/15/16   Kathrine Haddock, NP  gabapentin (NEURONTIN) 300 MG capsule Take 300  mg by mouth 3 (three) times daily.    [provider]  ipratropium-albuterol (DUONEB) 0.5-2.5 (3) MG/3ML SOLN Take 3 mLs by nebulization every 4 (four) hours as needed. 03/03/16   Kathrine Haddock, NP  LORazepam (ATIVAN) 0.5 MG tablet Take by mouth. 2 tablets at bedtime Reported on 02/26/2016 01/28/16   [provider]  omeprazole (PRILOSEC) 40 MG capsule Take 1 capsule (40 mg total) by mouth daily. 11/11/16 02/09/17  Jonathon Bellows, MD  ondansetron (ZOFRAN ODT) 4 MG disintegrating tablet Allow 1-2 tablets to dissolve in your mouth every 8 hours as needed for nausea/vomiting Patient not taking: Reported on  06/28/2017 03/22/17   Hinda Kehr, MD  oxyCODONE-acetaminophen (ROXICET) 5-325 MG tablet Take 1-2 tablets by mouth every 4 (four) hours as needed for severe pain. Patient not taking: Reported on 03/26/2017 03/22/17   Hinda Kehr, MD  sertraline (ZOLOFT) 50 MG tablet Take 50 mg by mouth daily.    [provider]  SUMAtriptan (IMITREX) 100 MG tablet TAKE 1 TAB BY MOUTH AT START OF MIGRAINE. MAY REPEAT IN 2 HOURS IF HEADACHE PERSISTS OR RECURS. MAX: 2 TABS IN 24 HRS. 05/28/17   Melvenia Beam, MD  tiZANidine (ZANAFLEX) 4 MG tablet TAKE 1 TABLET BY MOUTH THREE TIMES DAILY AS NEEDED. 06/07/17   Wynetta Emery, Megan P, DO  topiramate (TOPAMAX) 100 MG tablet TAKE 1 TABLET(100 MG) BY MOUTH TWICE DAILY 02/09/17   Johnson, Megan P, DO  traZODone (DESYREL) 150 MG tablet Take 150 mg by mouth daily. 07/23/16   [provider]    Allergies Lexapro [escitalopram oxalate] and Morphine and related  Family History  Problem Relation Age of Onset  . Asthma Mother   . Diabetes Mother   . Hyperlipidemia Mother   . Hypertension Mother   . Cirrhosis Mother        Non-alcoholic  . Arthritis Father   . Cancer Father        Bone  . Hyperlipidemia Father   . Hypertension Father   . Kidney Stones Son   . Stroke Maternal Grandmother   . Cancer Maternal Grandfather        lung  . Pneumonia Paternal Grandmother   . Alzheimer's disease Paternal Grandfather   . Diabetes Sister   . Gout Sister   . Hypertension Sister   . Alcohol abuse Brother   . Heart disease Brother        massive MI    Social History Social History   Tobacco Use  . Smoking status: Former Smoker    Packs/day: 0.50    Types: Cigarettes    Last attempt to quit: 04/07/2016    Years since quitting: 1.3  . Smokeless tobacco: Never Used  . Tobacco comment: patient has not smoked x 10 days.06/28/17 chantix  Substance Use Topics  . Alcohol use: No    Alcohol/week: 0.0 oz  . Drug use: No    Review of Systems Constitutional:  Negative for recent illness Cardiovascular: Negative for chest pain Respiratory: Negative for shortness of breath Musculoskeletal: Positive for right elbow pain Skin: Positive for laceration to the right elbow  Neurological: Negative for syncope or loss of consciousness  ____________________________________________   PHYSICAL EXAM:  VITAL SIGNS: ED Triage Vitals  Enc Vitals Group     BP 08/28/17 1018 118/78     Pulse Rate 08/28/17 1018 70     Resp 08/28/17 1018 20     Temp 08/28/17 1018 98 F (36.7 C)  Temp Source 08/28/17 1018 Oral     SpO2 08/28/17 1018 96 %     Weight 08/28/17 1019 200 lb (90.7 kg)     Height 08/28/17 1019 5\' 2"  (1.575 m)     Head Circumference --      Peak Flow --      Pain Score 08/28/17 1018 8     Pain Loc --      Pain Edu? --      Excl. in [place]? --     Constitutional: Alert and oriented. Well appearing and in no acute distress. Eyes: Conjunctiva are clear without discharge or drainage.  Head: Atraumatic Neck: Nexus criteria is negative. Respiratory: Respirations are even and unlabored Musculoskeletal: Limited extension of the right elbow secondary to pain. No obvious deformity. No tenderness to palpation over the humerus or shoulder. No step-off deformity noted. No pain on palpation over the right forearm, wrist, or right hand or fingers. Neurologic: Sensation to sharp and dull and intact.   Skin: 12cm gaping laceration with adipose tissue exposure to the right elbow.  Psychiatric: Behavior and affect are appropriate.  ____________________________________________   LABS (all labs ordered are listed, but only abnormal results are displayed)  Labs Reviewed - No data to display ____________________________________________  RADIOLOGY  Right elbow image negative for acute bony abnormality. I, Sherrie George, personally viewed and evaluated these images (plain radiographs) as part of my medical decision making, as well as reviewing the written  report by the radiologist.  ___________________________________________   PROCEDURES  Procedure(s) performed:  LACERATION REPAIR Performed by: Sherrie George  Consent: Verbal consent obtained.  Consent given by: patient  Prepped and Draped in normal sterile fashion  Wound explored: Small black foreign bodies removed.  Laceration Location: Right elbow  Laceration Length: 12 cm  Anesthesia: Local   Local anesthetic: lidocaine 1 % without epinephrine  Anesthetic total: 10 ml  Irrigation method: syringe  Amount of cleaning: 100 ML's   Skin closure: 4-0 Vicryl/4-0 Ethilon   Number of sutures: Running suture in children dermal, 12 horizontal mattress sutures external   Technique: Running/ Horizontal mattress   Patient tolerance: Patient tolerated the procedure well with no immediate complications.     ____________________________________________   INITIAL IMPRESSION / ASSESSMENT AND PLAN / ED COURSE  TULA SCHRYVER is a 56 y.o. female who presents to the emergency department for treatment and evaluation after sustaining a mechanical, non-syncopal fall just before arrival. Wound edges were easily reapproximated. She was advised on wound care. She will follow up with her primary care provider in 10-12 days for suture removal. She was advised to return to the emergency department for any symptoms of concern if she is unable schedule an appointment with primary care.  Pertinent labs & imaging results that were available during my care of the patient were reviewed by me and considered in my medical decision making (see chart for details).  _________________________________________   FINAL CLINICAL IMPRESSION(S) / ED DIAGNOSES  Final diagnoses:  Elbow laceration, right, initial encounter    If controlled substance prescribed during this visit, 12 month history viewed on the [place] prior to issuing an initial prescription for Schedule II or III opiod.    Victorino Dike, FNP 08/28/17 1339    Nena Polio, MD 08/28/17 [phone]

## 2017-08-28 NOTE — ED Notes (Signed)
Pt states she fell this morning about 940 on concrete and has a laceration on the right arm. Husband is at bedside, PT is NAD. Awaiting EDP.

## 2017-08-28 NOTE — Discharge Instructions (Signed)
Do not get the sutured area wet for 24 hours. After 24 hours, shower/bathe as usual and pat the area dry. Change the bandage 2 times per day and apply antibiotic ointment. Leave open to air when at no risk of getting the area dirty, but cover at night before bed. See her primary care provider in 10-12 days for suture removal or sooner for signs or concern of infection.

## 2017-08-28 NOTE — ED Triage Notes (Signed)
Tripped and fell on cement and hit R elbow. Large laceration noted with no bleeding at present.

## 2017-09-03 ENCOUNTER — Other Ambulatory Visit: Payer: Self-pay | Admitting: Family Medicine

## 2017-09-03 NOTE — Telephone Encounter (Signed)
rx

## 2017-09-08 ENCOUNTER — Encounter: Payer: Self-pay | Admitting: Family Medicine

## 2017-09-08 ENCOUNTER — Ambulatory Visit: Payer: Medicare Other | Admitting: Family Medicine

## 2017-09-08 ENCOUNTER — Ambulatory Visit: Payer: Medicare Other

## 2017-09-08 VITALS — BP 147/84 | HR 88 | Temp 97.7°F | Wt 214.3 lb

## 2017-09-08 DIAGNOSIS — S51011D Laceration without foreign body of right elbow, subsequent encounter: Secondary | ICD-10-CM

## 2017-09-08 NOTE — Progress Notes (Signed)
BP (!) 147/84 (BP Location: Left Arm, Patient Position: Sitting, Cuff Size: Large)   Pulse 88   Temp 97.7 F (36.5 C) (Oral)   Wt 214 lb 4.8 oz (97.2 kg)   SpO2 99%   BMI 39.20 kg/m    Subjective:    Patient ID: Susan Houston, female    DOB: Dec 27, 1960, 56 y.o.   MRN: [id number]  HPI: Susan Houston is a 56 y.o. female  Chief Complaint  Patient presents with  . Suture / Staple Removal   Patient presents for suture removal for laceration of right elbow incurred during a fall onto concrete. Sutures were placed in the ER about 10 days ago and due to come out today. Healing well without issues. Has been putting neosporin and keeping area covered. Some redness in the area, but no heat, drainage, fevers, chills.   Relevant past medical, surgical, family and social history reviewed and updated as indicated. Interim medical history since our last visit reviewed. Allergies and medications reviewed and updated.  Review of Systems  Constitutional: Negative.   HENT: Negative.   Respiratory: Negative.   Cardiovascular: Negative.   Gastrointestinal: Negative.   Musculoskeletal: Negative.   Skin:       3-4 inch laceration of left elbow  Neurological: Negative.   Psychiatric/Behavioral: Negative.     Per HPI unless specifically indicated above     Objective:    BP (!) 147/84 (BP Location: Left Arm, Patient Position: Sitting, Cuff Size: Large)   Pulse 88   Temp 97.7 F (36.5 C) (Oral)   Wt 214 lb 4.8 oz (97.2 kg)   SpO2 99%   BMI 39.20 kg/m   Wt Readings from Last 3 Encounters:  09/08/17 214 lb 4.8 oz (97.2 kg)  08/28/17 200 lb (90.7 kg)  06/28/17 224 lb (101.6 kg)    Physical Exam  Constitutional: She is oriented to person, place, and time. She appears well-developed and well-nourished.  HENT:  Head: Atraumatic.  Eyes: Conjunctivae are normal. Pupils are equal, round, and reactive to light.  Neck: Normal range of motion. Neck supple.  Cardiovascular: Normal rate.    Pulmonary/Chest: Effort normal and breath sounds normal. No respiratory distress.  Musculoskeletal: Normal range of motion.  Neurological: She is alert and oriented to person, place, and time.  Skin: Skin is warm and dry. There is erythema (mild erythema at wound edges, no streaking, drainage, heat).  Psychiatric: She has a normal mood and affect. Her behavior is normal.  Nursing note and vitals reviewed.  Procedure Note: Suture removal, right elbow laceration Area was prepped with alcohol, suture removal kit used to remove several sutures. Horizontal mattress stitches as well as a few of the running stitches were incredibly tight with a fair amount of healing overgrowth. Dr. Jeananne Rama consulted to help release the last few sutures, which required use of a #11 scalpel. Pt did have some minor bleeding associated with releasing a few of the tight sutures, bleeding well controlled with gauze and pressure. Area was dressed with antibacterial ointment and a bandage. Wound care reviewed. Pt tolerated procedure well without immediate complications.      Assessment & Plan:   Problem List Items Addressed This Visit    None    Visit Diagnoses    Laceration of right elbow, subsequent encounter    -  Primary   IM toradol given for pain as sutures were difficult to remove and caused a lot of pain. Continue wound care as discussed. Return  precautions given.    Relevant Medications   ketorolac (TORADOL) injection 60 mg (Completed)       Follow up plan: Return if symptoms worsen or fail to improve.

## 2017-09-10 ENCOUNTER — Ambulatory Visit: Payer: Medicare Other | Admitting: Family Medicine

## 2017-09-10 MED ORDER — KETOROLAC TROMETHAMINE 60 MG/2ML IM SOLN
60.0000 mg | Freq: Once | INTRAMUSCULAR | Status: AC
  Administered 2017-09-08: 60 mg via INTRAMUSCULAR

## 2017-09-11 NOTE — Patient Instructions (Signed)
Follow up as needed

## 2017-09-24 ENCOUNTER — Ambulatory Visit: Payer: Self-pay | Admitting: *Deleted

## 2017-09-24 NOTE — Telephone Encounter (Signed)
  Answer Assessment - Initial Assessment Questions 1. SYMPTOM: "What is the main symptom you are concerned about?" (e.g., weakness, numbness)     Patient reports that she falls- she has fallen 3 times since last Friday. She did have to go get stitches with one of those falls 2. ONSET: "When did this start?" (minutes, hours, days; while sleeping)     Chronic condition- getting worse- using cane know 3. LAST NORMAL: "When was the last time you were normal (no symptoms)?"     Patient states her legs give out without warning 4. PATTERN "Does this come and go, or has it been constant since it started?"  "Is it present now?"     Comes and goes- chronic problem 5. CARDIAC SYMPTOMS: "Have you had any of the following symptoms: chest pain, difficulty breathing, palpitations?"     no 6. NEUROLOGIC SYMPTOMS: "Have you had any of the following symptoms: headache, dizziness, vision loss, double vision, changes in speech, unsteady on your feet?"     Patient has migraines- not related- no dizziness related to fall 7. OTHER SYMPTOMS: "Do you have any other symptoms?"     no 8. PREGNANCY: "Is there any chance you are pregnant?" "When was your last menstrual period?"     n/a    Talking to patient and she asked if she can call me back on Monday because her husband is due home and she has to get dressed. Told patient to use her cane when moving about. She agrees. Call disconnected.  Protocols used: NEUROLOGIC DEFICIT-A-AH

## 2017-09-26 ENCOUNTER — Telehealth: Payer: Self-pay | Admitting: *Deleted

## 2017-09-26 NOTE — Telephone Encounter (Signed)
Called and LVM (ok per DPR) informing patient that the office is closed tomorrow, Monday 12/10 due to the snow and we will call back reschedule her appt ASAP. Attempted to call husband on DPR but no answer.

## 2017-09-26 NOTE — Telephone Encounter (Signed)
Called and spoke with patient. She had not received the voicemail left earlier. I informed the patient that she has an appt tomorrow 12/10, but the office will be closed due to the snow. I told her that we would call ASAP to reschedule the patient. She verbalized understanding and appreciation for the call.

## 2017-09-27 ENCOUNTER — Ambulatory Visit: Payer: Medicare Other | Admitting: Neurology

## 2017-09-29 NOTE — Telephone Encounter (Signed)
Called to r/s pt. LVM asking for call back.

## 2017-10-05 ENCOUNTER — Other Ambulatory Visit: Payer: Self-pay | Admitting: Family Medicine

## 2017-10-05 NOTE — Telephone Encounter (Signed)
Called patient and LVM asking for call back so we can get her r/s from where the office was closed due to the snow.

## 2017-10-08 ENCOUNTER — Telehealth: Payer: Self-pay | Admitting: Family Medicine

## 2017-10-08 MED ORDER — NYSTATIN 100000 UNIT/GM EX OINT: 1.0000 "application " | TOPICAL_OINTMENT | Freq: Two times a day (BID) | CUTANEOUS | 2 refills | Status: DC

## 2017-10-08 NOTE — Telephone Encounter (Signed)
Copied from [place]. Topic: Quick Communication - Rx Refill/Question >> Oct 08, 2017  1:46 PM Patrice Paradise wrote: Has the patient contacted their pharmacy? Yes.  nystatin ointment (MYCOSTATIN)   (Agent: If no, request that the patient contact the pharmacy for the refill.)  Preferred Pharmacy (with phone number or street name):  [facility] [address], [address] [place] [address] Phone: [phone] Fax: [phone]   Agent: Please be advised that RX refills may take up to 3 business days. We ask that you follow-up with your pharmacy.

## 2017-10-08 NOTE — Telephone Encounter (Signed)
Left message for pt. To call back about Nystatin ointment refill. Not on medication list - and is pt. Having rash issues.

## 2017-10-08 NOTE — Telephone Encounter (Signed)
Pt is requesting a refill on Mycostatin. I do not see an order for this

## 2017-10-08 NOTE — Addendum Note (Signed)
Addended by: Valerie Roys on: 10/08/2017 04:00 PM   Modules accepted: Orders

## 2017-10-08 NOTE — Telephone Encounter (Signed)
Rx sent to her pharmacy 

## 2017-10-08 NOTE — Telephone Encounter (Signed)
Routing to provider  

## 2017-11-06 ENCOUNTER — Other Ambulatory Visit: Payer: Self-pay | Admitting: Family Medicine

## 2017-11-23 DIAGNOSIS — R296 Repeated falls: Secondary | ICD-10-CM | POA: Diagnosis not present

## 2017-11-23 DIAGNOSIS — E538 Deficiency of other specified B group vitamins: Secondary | ICD-10-CM | POA: Diagnosis not present

## 2017-11-23 DIAGNOSIS — G3184 Mild cognitive impairment, so stated: Secondary | ICD-10-CM | POA: Diagnosis not present

## 2017-11-23 DIAGNOSIS — F03A0 Unspecified dementia, mild, without behavioral disturbance, psychotic disturbance, mood disturbance, and anxiety: Secondary | ICD-10-CM | POA: Insufficient documentation

## 2017-11-23 DIAGNOSIS — R251 Tremor, unspecified: Secondary | ICD-10-CM | POA: Diagnosis not present

## 2017-11-23 DIAGNOSIS — F0390 Unspecified dementia without behavioral disturbance: Secondary | ICD-10-CM | POA: Insufficient documentation

## 2017-11-23 HISTORY — DX: Mild cognitive impairment of uncertain or unknown etiology: G31.84

## 2017-11-25 DIAGNOSIS — R296 Repeated falls: Secondary | ICD-10-CM | POA: Diagnosis not present

## 2017-11-25 DIAGNOSIS — G3184 Mild cognitive impairment, so stated: Secondary | ICD-10-CM | POA: Diagnosis not present

## 2017-11-25 DIAGNOSIS — R251 Tremor, unspecified: Secondary | ICD-10-CM | POA: Diagnosis not present

## 2017-12-03 ENCOUNTER — Ambulatory Visit (INDEPENDENT_AMBULATORY_CARE_PROVIDER_SITE_OTHER): Payer: Medicare Other | Admitting: Family Medicine

## 2017-12-03 ENCOUNTER — Encounter: Payer: Self-pay | Admitting: Family Medicine

## 2017-12-03 VITALS — BP 133/79 | HR 73 | Temp 97.4°F | Wt 195.1 lb

## 2017-12-03 DIAGNOSIS — N3000 Acute cystitis without hematuria: Secondary | ICD-10-CM

## 2017-12-03 DIAGNOSIS — E782 Mixed hyperlipidemia: Secondary | ICD-10-CM | POA: Diagnosis not present

## 2017-12-03 DIAGNOSIS — F322 Major depressive disorder, single episode, severe without psychotic features: Secondary | ICD-10-CM

## 2017-12-03 DIAGNOSIS — R300 Dysuria: Secondary | ICD-10-CM

## 2017-12-03 DIAGNOSIS — R112 Nausea with vomiting, unspecified: Secondary | ICD-10-CM | POA: Diagnosis not present

## 2017-12-03 DIAGNOSIS — J449 Chronic obstructive pulmonary disease, unspecified: Secondary | ICD-10-CM

## 2017-12-03 MED ORDER — CIPROFLOXACIN HCL 500 MG PO TABS: 500.0000 mg | ORAL_TABLET | Freq: Two times a day (BID) | ORAL | 0 refills | Status: DC

## 2017-12-03 MED ORDER — IPRATROPIUM-ALBUTEROL 0.5-2.5 (3) MG/3ML IN SOLN: 3.0000 mL | RESPIRATORY_TRACT | 3 refills | Status: DC | PRN

## 2017-12-03 NOTE — Progress Notes (Signed)
BP 133/79 (BP Location: Left Arm, Patient Position: Sitting, Cuff Size: Normal)   Pulse 73   Temp (!) 97.4 F (36.3 C)   Wt 195 lb 2 oz (88.5 kg)   SpO2 99%   BMI 35.69 kg/m    Subjective:    Patient ID: TAKYLA KUCHERA, female    DOB: Aug 14, 1961, 57 y.o.   MRN: [id number]  HPI: AVENLY ROBERGE is a 57 y.o. female  Chief Complaint  Patient presents with  . Urinary Tract Infection   URINARY SYMPTOMS- had a UA done on 11/25/17. Unclear who ordered this or why per fax. On review of Care Everywhere, was ordered by her neurologist on 11/25/17 as work up for memory loss and results came back 11/29/17. We received fax on 12/02/17. Came back positive for Citrobacter freundii and Klebsiella pneumoniae. Susceptible to bactrim, ceftriaxone, cipro and tetracycline. It does not appear that patient was treated for this.  Duration: 2+ weeks Dysuria: no Urinary frequency: no Urgency: no Small volume voids: no Symptom severity: mild Urinary incontinence: no Foul odor: yes Hematuria: no Abdominal pain: no Back pain: yes- no changes, just her regular pain Suprapubic pain/pressure: no Flank pain: no Fever:  no Vomiting: yes Relief with cranberry juice: no Relief with pyridium: no Status: stable Previous urinary tract infection: yes Recurrent urinary tract infection: no History of sexually transmitted disease: no Vaginal discharge: no Treatments attempted: increasing fluids   HYPERLIPIDEMIA Hyperlipidemia status: Not on anything- has been losing weight Satisfied with current treatment?  yes Side effects:  Not on anything Aspirin:  no The 10-year ASCVD risk score Mikey Bussing DC Jr., et al., 2013) is: 2.8%   Values used to calculate the score:     Age: 52 years     Sex: Female     Is Non-Hispanic African American: No     Diabetic: No     Tobacco smoker: No     Systolic Blood Pressure: 093 mmHg     Is BP treated: No     HDL Cholesterol: 45 mg/dL     Total Cholesterol: 164 mg/dL Chest pain:   no Coronary artery disease:  no Family history CAD:  no Family history early CAD:  no  COPD COPD status: stable Satisfied with current treatment?: yes Oxygen use: no Dyspnea frequency: occasionally Cough frequency: occasionally Rescue inhaler frequency:  rarely Limitation of activity: no Pneumovax: Up to Date Influenza: Up to Date  Has not been doing well for the past several months. Has been falling. Seeing Dr. Melrose Nakayama. Has been very nauseous and losing weight. Not feeling well at all. Seeing a new psychiatrist in about 3 weeks.   Relevant past medical, surgical, family and social history reviewed and updated as indicated. Interim medical history since our last visit reviewed. Allergies and medications reviewed and updated.  Review of Systems  Respiratory: Negative.   Cardiovascular: Negative.   Gastrointestinal: Negative.   Genitourinary: Negative.   Neurological: Positive for dizziness and weakness. Negative for tremors, seizures, syncope, facial asymmetry, speech difficulty, light-headedness, numbness and headaches.  Psychiatric/Behavioral: Positive for agitation, behavioral problems, confusion, decreased concentration, dysphoric mood, hallucinations and sleep disturbance. Negative for self-injury and suicidal ideas. The patient is nervous/anxious. The patient is not hyperactive.     Per HPI unless specifically indicated above     Objective:    BP 133/79 (BP Location: Left Arm, Patient Position: Sitting, Cuff Size: Normal)   Pulse 73   Temp (!) 97.4 F (36.3 C)   Wt 195  lb 2 oz (88.5 kg)   SpO2 99%   BMI 35.69 kg/m   Wt Readings from Last 3 Encounters:  12/03/17 195 lb 2 oz (88.5 kg)  09/08/17 214 lb 4.8 oz (97.2 kg)  08/28/17 200 lb (90.7 kg)    Physical Exam  Constitutional: She is oriented to person, place, and time. She appears well-developed and well-nourished. No distress.  HENT:  Head: Normocephalic and atraumatic.  Right Ear: Hearing normal.  Left Ear:  Hearing normal.  Nose: Nose normal.  Eyes: Conjunctivae and lids are normal. Right eye exhibits no discharge. Left eye exhibits no discharge. No scleral icterus.  Cardiovascular: Normal rate, regular rhythm, normal heart sounds and intact distal pulses. Exam reveals no gallop and no friction rub.  No murmur heard. Pulmonary/Chest: Effort normal and breath sounds normal. No respiratory distress. She has no wheezes. She has no rales. She exhibits no tenderness.  Musculoskeletal: Normal range of motion.  Neurological: She is alert and oriented to person, place, and time. Coordination abnormal.  Skin: Skin is warm, dry and intact. No rash noted. She is not diaphoretic. No erythema. No pallor.  Psychiatric: She has a normal mood and affect. Her speech is normal and behavior is normal. Judgment and thought content normal. Cognition and memory are normal.  Nursing note and vitals reviewed.   Results for orders placed or performed during the hospital encounter of 03/22/17  Lipase, blood  Result Value Ref Range   Lipase 24 11 - 51 U/L  Comprehensive metabolic panel  Result Value Ref Range   Sodium 137 135 - 145 mmol/L   Potassium 3.9 3.5 - 5.1 mmol/L   Chloride 105 101 - 111 mmol/L   CO2 24 22 - 32 mmol/L   Glucose, Bld 111 (H) 65 - 99 mg/dL   BUN 10 6 - 20 mg/dL   Creatinine, Ser 0.94 0.44 - 1.00 mg/dL   Calcium 9.4 8.9 - 10.3 mg/dL   Total Protein 8.3 (H) 6.5 - 8.1 g/dL   Albumin 4.2 3.5 - 5.0 g/dL   AST 55 (H) 15 - 41 U/L   ALT 40 14 - 54 U/L   Alkaline Phosphatase 90 38 - 126 U/L   Total Bilirubin 0.9 0.3 - 1.2 mg/dL   GFR calc non Af Amer >60 >60 mL/min   GFR calc Af Amer >60 >60 mL/min   Anion gap 8 5 - 15  CBC  Result Value Ref Range   WBC 7.1 3.6 - 11.0 K/uL   RBC 4.72 3.80 - 5.20 MIL/uL   Hemoglobin 13.5 12.0 - 16.0 g/dL   HCT 40.0 35.0 - 47.0 %   MCV 84.8 80.0 - 100.0 fL   MCH 28.6 26.0 - 34.0 pg   MCHC 33.7 32.0 - 36.0 g/dL   RDW 14.9 (H) 11.5 - 14.5 %   Platelets 239  150 - 440 K/uL      Assessment & Plan:   Problem List Items Addressed This Visit      Respiratory   COPD (chronic obstructive pulmonary disease) (HCC)    Stable. Continue nebulizer. Call with any concerns.       Relevant Medications   ipratropium-albuterol (DUONEB) 0.5-2.5 (3) MG/3ML SOLN   Other Relevant Orders   CBC with Differential/Platelet     Other   Severe depression ([place])    Following up with psychiatry on 12/21/17. Await their input. Call with any concerns.       Hyperlipidemia    Rechecking levels today. Await  results. Call with any concerns.       Relevant Orders   Lipid Panel w/o Chol/HDL Ratio   Comprehensive metabolic panel    Other Visit Diagnoses    Acute cystitis without hematuria    -  Primary   Will treat w/ cipro. Rx sent to her pharmacy. Call if not getting better or getting worse. Recheck urine 10 days. Concern that this has been going on for months   Relevant Orders   UA/M w/rflx Culture, Routine   Dysuria       +UA previously, continues to be + today.   Nausea and vomiting, intractability of vomiting not specified, unspecified vomiting type       Will check labs. Significant concern that this is from UTI which sounds like it may have been going on for months.    Relevant Orders   CBC with Differential/Platelet   Comprehensive metabolic panel   H. pylori antibody, IgG([facility])   Amylase   Lipase       Follow up plan: Return 10 days-2 weeks, for follow up urine and nausea.

## 2017-12-03 NOTE — Assessment & Plan Note (Signed)
Following up with psychiatry on 12/21/17. Await their input. Call with any concerns.

## 2017-12-03 NOTE — Assessment & Plan Note (Signed)
Rechecking levels today. Await results. Call with any concerns.  

## 2017-12-03 NOTE — Assessment & Plan Note (Signed)
Stable. Continue nebulizer. Call with any concerns.

## 2017-12-05 LAB — UA/M W/RFLX CULTURE, ROUTINE
BILIRUBIN UA: NEGATIVE
Glucose, UA: NEGATIVE
NITRITE UA: POSITIVE — AB
RBC, UA: NEGATIVE
Specific Gravity, UA: 1.025 (ref 1.005–1.030)
Urobilinogen, Ur: 2 mg/dL — ABNORMAL HIGH (ref 0.2–1.0)
pH, UA: 5.5 (ref 5.0–7.5)

## 2017-12-05 LAB — MICROSCOPIC EXAMINATION

## 2017-12-05 LAB — URINE CULTURE, REFLEX

## 2017-12-06 ENCOUNTER — Ambulatory Visit: Payer: Self-pay | Admitting: *Deleted

## 2017-12-06 ENCOUNTER — Telehealth: Payer: Self-pay | Admitting: Family Medicine

## 2017-12-06 ENCOUNTER — Encounter: Payer: Self-pay | Admitting: Intensive Care

## 2017-12-06 ENCOUNTER — Emergency Department
Admission: EM | Admit: 2017-12-06 | Discharge: 2017-12-06 | Disposition: A | Discharge status: Home / Self Care | Payer: Medicare Other | Attending: Emergency Medicine | Admitting: Emergency Medicine

## 2017-12-06 DIAGNOSIS — Z7982 Long term (current) use of aspirin: Secondary | ICD-10-CM | POA: Diagnosis not present

## 2017-12-06 DIAGNOSIS — Z87891 Personal history of nicotine dependence: Secondary | ICD-10-CM | POA: Insufficient documentation

## 2017-12-06 DIAGNOSIS — R112 Nausea with vomiting, unspecified: Secondary | ICD-10-CM | POA: Insufficient documentation

## 2017-12-06 DIAGNOSIS — Z79899 Other long term (current) drug therapy: Secondary | ICD-10-CM | POA: Diagnosis not present

## 2017-12-06 DIAGNOSIS — J449 Chronic obstructive pulmonary disease, unspecified: Secondary | ICD-10-CM | POA: Insufficient documentation

## 2017-12-06 DIAGNOSIS — Z96653 Presence of artificial knee joint, bilateral: Secondary | ICD-10-CM | POA: Diagnosis not present

## 2017-12-06 DIAGNOSIS — E860 Dehydration: Secondary | ICD-10-CM | POA: Insufficient documentation

## 2017-12-06 DIAGNOSIS — Z8521 Personal history of malignant neoplasm of larynx: Secondary | ICD-10-CM | POA: Insufficient documentation

## 2017-12-06 DIAGNOSIS — I10 Essential (primary) hypertension: Secondary | ICD-10-CM | POA: Insufficient documentation

## 2017-12-06 DIAGNOSIS — J45909 Unspecified asthma, uncomplicated: Secondary | ICD-10-CM | POA: Insufficient documentation

## 2017-12-06 LAB — CBC WITH DIFFERENTIAL/PLATELET
BASOS: 2 %
Basophils Absolute: 0.1 10*3/uL (ref 0.0–0.2)
EOS (ABSOLUTE): 0.1 10*3/uL (ref 0.0–0.4)
EOS: 3 %
HEMATOCRIT: 38.2 % (ref 34.0–46.6)
Hemoglobin: 12.4 g/dL (ref 11.1–15.9)
Immature Grans (Abs): 0 10*3/uL (ref 0.0–0.1)
Immature Granulocytes: 0 %
Lymphocytes Absolute: 1.2 10*3/uL (ref 0.7–3.1)
Lymphs: 30 %
MCH: 29.5 pg (ref 26.6–33.0)
MCHC: 32.5 g/dL (ref 31.5–35.7)
MCV: 91 fL (ref 79–97)
MONOS ABS: 0.4 10*3/uL (ref 0.1–0.9)
Monocytes: 9 %
NEUTROS ABS: 2.3 10*3/uL (ref 1.4–7.0)
NEUTROS PCT: 56 %
Platelets: 221 10*3/uL (ref 150–379)
RBC: 4.2 x10E6/uL (ref 3.77–5.28)
RDW: 14.2 % (ref 12.3–15.4)
WBC: 4.1 10*3/uL (ref 3.4–10.8)

## 2017-12-06 LAB — COMPREHENSIVE METABOLIC PANEL
A/G RATIO: 1.3 (ref 1.2–2.2)
ALBUMIN: 3.7 g/dL (ref 3.5–5.0)
ALK PHOS: 80 U/L (ref 38–126)
ALT: 7 IU/L (ref 0–32)
ALT: 6 U/L — AB (ref 14–54)
AST: 14 IU/L (ref 0–40)
AST: 30 U/L (ref 15–41)
Albumin: 3.8 g/dL (ref 3.5–5.5)
Alkaline Phosphatase: 83 IU/L (ref 39–117)
Anion gap: 9 (ref 5–15)
BILIRUBIN TOTAL: 0.6 mg/dL (ref 0.0–1.2)
BILIRUBIN TOTAL: 0.9 mg/dL (ref 0.3–1.2)
BUN/Creatinine Ratio: 9 (ref 9–23)
BUN: 8 mg/dL (ref 6–24)
BUN: 5 mg/dL — AB (ref 6–20)
CALCIUM: 9 mg/dL (ref 8.9–10.3)
CHLORIDE: 99 mmol/L (ref 96–106)
CO2: 19 mmol/L — ABNORMAL LOW (ref 20–29)
CO2: 22 mmol/L (ref 22–32)
Calcium: 9.6 mg/dL (ref 8.7–10.2)
Chloride: 105 mmol/L (ref 101–111)
Creatinine, Ser: 0.88 mg/dL (ref 0.57–1.00)
Creatinine, Ser: 0.81 mg/dL (ref 0.44–1.00)
GFR calc Af Amer: >60 mL/min (ref >60)
GFR calc non Af Amer: >60 mL/min (ref >60)
GFR, EST AFRICAN AMERICAN: 84 mL/min/{1.73_m2} (ref >59)
GFR, EST NON AFRICAN AMERICAN: 73 mL/min/{1.73_m2} (ref >59)
GLOBULIN, TOTAL: 3 g/dL (ref 1.5–4.5)
GLUCOSE: 122 mg/dL — AB (ref 65–99)
Glucose: 95 mg/dL (ref 65–99)
POTASSIUM: 4.9 mmol/L (ref 3.5–5.2)
Potassium: 3.5 mmol/L (ref 3.5–5.1)
SODIUM: 137 mmol/L (ref 134–144)
SODIUM: 136 mmol/L (ref 135–145)
TOTAL PROTEIN: 6.8 g/dL (ref 6.0–8.5)
Total Protein: 7.5 g/dL (ref 6.5–8.1)

## 2017-12-06 LAB — URINALYSIS, COMPLETE (UACMP) WITH MICROSCOPIC
Bilirubin Urine: NEGATIVE
Glucose, UA: NEGATIVE mg/dL
Hgb urine dipstick: NEGATIVE
KETONES UR: NEGATIVE mg/dL
Leukocytes, UA: NEGATIVE
Nitrite: NEGATIVE
PROTEIN: NEGATIVE mg/dL
Specific Gravity, Urine: 1.012 (ref 1.005–1.030)
pH: 5 (ref 5.0–8.0)

## 2017-12-06 LAB — LIPID PANEL W/O CHOL/HDL RATIO
CHOLESTEROL TOTAL: 169 mg/dL (ref 100–199)
HDL: 45 mg/dL (ref >39)
LDL Calculated: 99 mg/dL (ref 0–99)
TRIGLYCERIDES: 126 mg/dL (ref 0–149)
VLDL CHOLESTEROL CAL: 25 mg/dL (ref 5–40)

## 2017-12-06 LAB — CBC
HEMATOCRIT: 39.8 % (ref 35.0–47.0)
HEMOGLOBIN: 13.5 g/dL (ref 12.0–16.0)
MCH: 30.4 pg (ref 26.0–34.0)
MCHC: 33.9 g/dL (ref 32.0–36.0)
MCV: 89.8 fL (ref 80.0–100.0)
Platelets: 231 10*3/uL (ref 150–440)
RBC: 4.43 MIL/uL (ref 3.80–5.20)
RDW: 13.8 % (ref 11.5–14.5)
WBC: 4.1 10*3/uL (ref 3.6–11.0)

## 2017-12-06 LAB — LIPASE, BLOOD: Lipase: 27 U/L (ref 11–51)

## 2017-12-06 LAB — AMYLASE: Amylase: 24 U/L — ABNORMAL LOW (ref 31–124)

## 2017-12-06 LAB — H. PYLORI ANTIBODY, IGG: H. pylori, IgG AbS: <0.8 Index Value (ref 0.00–0.79)

## 2017-12-06 LAB — LIPASE: Lipase: 20 U/L (ref 14–72)

## 2017-12-06 MED ORDER — METOCLOPRAMIDE HCL 5 MG/ML IJ SOLN
10.0000 mg | Freq: Once | INTRAMUSCULAR | Status: AC
  Administered 2017-12-06: 10 mg via INTRAVENOUS
  Filled 2017-12-06: qty 2

## 2017-12-06 MED ORDER — SODIUM CHLORIDE 0.9 % IV BOLUS (SEPSIS)
1000.0000 mL | Freq: Once | INTRAVENOUS | Status: AC
  Administered 2017-12-06: 1000 mL via INTRAVENOUS

## 2017-12-06 MED ORDER — METOCLOPRAMIDE HCL 10 MG PO TABS: 10.0000 mg | ORAL_TABLET | Freq: Three times a day (TID) | ORAL | 0 refills | Status: DC | PRN

## 2017-12-06 NOTE — ED Provider Notes (Signed)
[facility] Emergency Department Provider Note  ____________________________________________   I have reviewed the triage vital signs and the nursing notes.   HISTORY  Chief Complaint Emesis  History limited by: Not Limited   HPI Susan Houston is a 57 y.o. female who presents to the emergency department today with chief complaint of emesis.  The patient states that she has been having issues with nausea and vomiting since the beginning of this month.  She states it all started after he was put on carbidopa by her neurologist.  Her neurologist then added Zofran as well but she continues to have nausea and vomiting.  She states she is having a hard time keeping anything down.  In addition the patient was recently diagnosed with a urinary tract infection is concerned that she has not been able to keep her medications down a long enough to cure that.  She denies any significant abdominal pain.  No fevers.   Per medical record review patient has a history of recent UTI with treatment.   Past Medical History:  Diagnosis Date  . Anxiety   . Arthritis   . Asthma   . Back pain   . Bruises easily   . 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 Chronic leg pain    BILATERAL  . COPD (chronic obstructive pulmonary disease) ([place])   . Depression   . Difficulty sleeping   . Diverticulitis   . Hemorrhoids   . History of kidney stones   . Hyperlipidemia   . Hypertension   . Incontinence of urine   . Migraine   . MRSA infection greater than 3 months ago    2008 right side of face  . Nerve damage    right leg    Patient Active Problem List   Diagnosis Date Noted  . Migraine without aura and without status migrainosus, not intractable 09/11/2016  . Hyperlipidemia   . COPD (chronic obstructive pulmonary disease) ([place])   . Chronic pain 12/24/2015  . Severe depression ([place]) 12/24/2015  . Chronic anxiety 12/24/2015  . Coarse tremors  12/24/2015  . Facet syndrome, lumbar 03/28/2015  . DDD (degenerative disc disease), lumbar 03/05/2015  . DDD (degenerative disc disease), lumbosacral 02/23/2015  . Sacroiliac joint dysfunction 02/23/2015  . Saphenous neuralgia 09/24/2014    Past Surgical History:  Procedure Laterality Date  . ABDOMINAL HYSTERECTOMY  1983  . BLADDER SUSPENSION  2014  . CHOLECYSTECTOMY    . COLONOSCOPY    . ESOPHAGOGASTRODUODENOSCOPY    . ESOPHAGOGASTRODUODENOSCOPY (EGD) WITH PROPOFOL N/A 11/20/2016   Procedure: ESOPHAGOGASTRODUODENOSCOPY (EGD) WITH PROPOFOL;  Surgeon: Jonathon Bellows, MD;  Location: [facility] ENDOSCOPY;  Service: Endoscopy;  Laterality: N/A;  . EXCISION NEUROMA Bilateral 09/24/2014   Procedure: BILATERAL OPEN SAPHENOUS NEURECTOMIES AND OPEN LEFT PERIPATELLA OSETOPHYTECTOMY;  Surgeon: Mauri Pole, MD;  Location: [facility] ORS;  Service: Orthopedics;  Laterality: Bilateral;  . JOINT REPLACEMENT  2009/2010   BIL TOTAL KNEES  . TONSILLECTOMY      Prior to Admission medications   Medication Sig Start Date End Date Taking? Authorizing Provider  aspirin 81 MG tablet Take 81 mg by mouth daily.    [provider]  busPIRone (BUSPAR) 15 MG tablet Take 15 mg by mouth daily. 07/23/16   [provider]  carbidopa-levodopa (SINEMET) 25-100 MG tablet Take by mouth. 11/23/17 12/23/17  [provider]  CHANTIX 1 MG tablet  06/09/17   [provider]  ciprofloxacin (CIPRO) 500 MG tablet Take 1 tablet (500 mg total) by mouth 2 (two) times daily. 12/03/17   Johnson, Megan P, DO  diclofenac (CATAFLAM) 50 MG tablet TAKE 1 TABLET(50 MG) BY MOUTH THREE TIMES DAILY AS NEEDED 06/02/17   Kathrynn Ducking, MD  divalproex (DEPAKOTE ER) 250 MG 24 hr tablet 1 po hs for 2 weeks then increase to 2 tabs daily. Stay on medication until f/u appt 06/28/17   Dennie Bible, NP  docusate sodium (COLACE) 100 MG capsule Take 1 tablet once or twice daily as needed for constipation while taking narcotic pain  medicine 03/22/17   Hinda Kehr, MD  doxepin (SINEQUAN) 50 MG capsule TK 1 OR 2 CS PO QHS PRN 01/23/16   [provider]  fluticasone (FLONASE) 50 MCG/ACT nasal spray INSTILL 2 SPRAYS IEN QD 12/25/16   [provider]  Fluticasone-Salmeterol (ADVAIR DISKUS) 250-50 MCG/DOSE AEPB Inhale 1 puff into the lungs 2 (two) times daily. 06/15/16   Kathrine Haddock, NP  gabapentin (NEURONTIN) 300 MG capsule Take 300 mg by mouth 3 (three) times daily.    [provider]  ipratropium-albuterol (DUONEB) 0.5-2.5 (3) MG/3ML SOLN Take 3 mLs by nebulization every 4 (four) hours as needed. 12/03/17   Johnson, Megan P, DO  LORazepam (ATIVAN) 0.5 MG tablet Take by mouth. 2 tablets at bedtime Reported on 02/26/2016 01/28/16   [provider]  nystatin ointment (MYCOSTATIN) Apply 1 application topically 2 (two) times daily. 10/08/17   Johnson, Megan P, DO  omeprazole (PRILOSEC) 40 MG capsule Take 1 capsule (40 mg total) by mouth daily. 11/11/16 02/09/17  Jonathon Bellows, MD  SUMAtriptan (IMITREX) 100 MG tablet TAKE 1 TAB BY MOUTH AT START OF MIGRAINE. MAY REPEAT IN 2 HOURS IF HEADACHE PERSISTS OR RECURS. MAX: 2 TABS IN 24 HRS. 05/28/17   Melvenia Beam, MD  tiZANidine (ZANAFLEX) 4 MG tablet TAKE 1 TABLET BY MOUTH THREE TIMES DAILY AS NEEDED 09/03/17   Wynetta Emery, Megan P, DO  topiramate (TOPAMAX) 100 MG tablet TAKE 1 TABLET(100 MG) BY MOUTH TWICE DAILY 11/08/17   Johnson, Megan P, DO  traZODone (DESYREL) 150 MG tablet Take 150 mg by mouth daily. 07/23/16   [provider]    Allergies Lexapro [escitalopram oxalate] and Morphine and related  Family History  Problem Relation Age of Onset  . Asthma Mother   . Diabetes Mother   . Hyperlipidemia Mother   . Hypertension Mother   . Cirrhosis Mother        Non-alcoholic  . Arthritis Father   . Cancer Father        Bone  . Hyperlipidemia Father   . Hypertension Father   . Kidney Stones Son   . Stroke Maternal Grandmother   . Cancer  Maternal Grandfather        lung  . Pneumonia Paternal Grandmother   . Alzheimer's disease Paternal Grandfather   . Diabetes Sister   . Gout Sister   . Hypertension Sister   . Alcohol abuse Brother   . Heart disease Brother        massive MI    Social History Social History   Tobacco Use  . Smoking status: Former Smoker    Packs/day: 0.50    Types: Cigarettes    Last attempt to quit: 04/07/2016    Years since quitting: 1.6  . Smokeless tobacco: Never Used  . Tobacco comment: patient has not smoked x 10 days.06/28/17 chantix  Substance Use Topics  .  Alcohol use: No    Alcohol/week: 0.0 oz  . Drug use: No    Review of Systems Constitutional: No fever/chills Eyes: No visual changes. ENT: No sore throat. Cardiovascular: Denies chest pain. Respiratory: Denies shortness of breath. Gastrointestinal: No abdominal pain.  Positive for nausea and vomiting. Genitourinary: Negative for dysuria. Musculoskeletal: Negative for back pain. Skin: Negative for rash. Neurological: Negative for headaches, focal weakness or numbness.  ____________________________________________   PHYSICAL EXAM:  VITAL SIGNS: ED Triage Vitals  Enc Vitals Group     BP 12/06/17 1709 126/62     Pulse Rate 12/06/17 1709 91     Resp 12/06/17 1709 16     Temp 12/06/17 1710 (!) 97.1 F (36.2 C)     Temp Source 12/06/17 1710 Axillary     SpO2 12/06/17 1709 97 %     Weight 12/06/17 1705 195 lb (88.5 kg)     Height 12/06/17 1705 5\' 2"  (1.575 m)     Head Circumference --      Peak Flow --      Pain Score 12/06/17 1705 5    Constitutional: Alert and oriented. Well appearing and in no distress. Eyes: Conjunctivae are normal.  ENT   Head: Normocephalic and atraumatic.   Nose: No congestion/rhinnorhea.   Mouth/Throat: Mucous membranes are moist.   Neck: No stridor. Hematological/Lymphatic/Immunilogical: No cervical lymphadenopathy. Cardiovascular: Normal rate, regular rhythm.  No murmurs,  rubs, or gallops.  Respiratory: Normal respiratory effort without tachypnea nor retractions. Breath sounds are clear and equal bilaterally. No wheezes/rales/rhonchi. Gastrointestinal: Soft and non tender. No rebound. No guarding.  Genitourinary: Deferred Musculoskeletal: Normal range of motion in all extremities. No lower extremity edema. Neurologic:  Normal speech and language. No gross focal neurologic deficits are appreciated.  Skin:  Skin is warm, dry and intact. No rash noted. Psychiatric: Mood and affect are normal. Speech and behavior are normal. Patient exhibits appropriate insight and judgment.  ____________________________________________    LABS (pertinent positives/negatives)  Lipase 27 CBC wbc 4.1, hgb 13.5, plt 231 CMP na 136, glu 122, cr 0.81 ____________________________________________   EKG  None  ____________________________________________    RADIOLOGY  None  ____________________________________________   PROCEDURES  Procedures  ____________________________________________   INITIAL IMPRESSION / ASSESSMENT AND PLAN / ED COURSE  Pertinent labs & imaging results that were available during my care of the patient were reviewed by me and considered in my medical decision making (see chart for details).  Patient presented to the ed because of concern for nausea and vomiting. Blood work without any concerning electrolyte abnormalities. UA without any continuing signs of infection. Patient felt better after IV fluids and medication. Will plan on discharging with antiemetic prescription. Discussed importance of follow up.   ____________________________________________   FINAL CLINICAL IMPRESSION(S) / ED DIAGNOSES  Final diagnoses:  Non-intractable vomiting with nausea, unspecified vomiting type  Dehydration     Note: This dictation was prepared with Dragon dictation. Any transcriptional errors that result from this process are unintentional      Nance Pear, MD 12/06/17 2300

## 2017-12-06 NOTE — ED Triage Notes (Signed)
Patient sees PCP Wynetta Emery who recently prescribed antibiotic Friday and patient reports speaking with Wynetta Emery again who told patient to come to ER since she cant keep any foods or liquids down since February. Patient c/o abdominal pain

## 2017-12-06 NOTE — Discharge Instructions (Signed)
Please seek medical attention for any high fevers, chest pain, shortness of breath, change in behavior, persistent vomiting, bloody stool or any other new or concerning symptoms.  

## 2017-12-06 NOTE — Telephone Encounter (Signed)
Pt state that she is having nausea and vomiting since she started medication for UTI(cipro); nurse triage initiated and recommendations give per protocol to include pt going to ED; also spoke with Darrick Meigs at [facility] who conferred with Dr Park Liter and also directed the pt to go to ED; pt verbalizes .  Reason for Disposition . [1] SEVERE vomiting (e.g., 6 or more times/day) AND [2] present > 8 hours  Answer Assessment - Initial Assessment Questions 1. VOMITING SEVERITY: "How many times have you vomited in the past 24 hours?"     - MILD:  1 - 2 times/day    - MODERATE: 3 - 5 times/day, decreased oral intake without significant weight loss or symptoms of dehydration    - SEVERE: 6 or more times/day, vomits everything or nearly everything, with significant weight loss, symptoms of dehydration      severe 2. ONSET: "When did the vomiting begin?"      12/03/17 3. FLUIDS: "What fluids or food have you vomited up today?" "Have you been able to keep any fluids down?"     Chicken broth 4. ABDOMINAL PAIN: "Are your having any abdominal pain?" If yes : "How bad is it and what does it feel like?" (e.g., crampy, dull, intermittent, constant)      Constant Pain  5. DIARRHEA: "Is there any diarrhea?" If so, ask: "How many times today?"      No; pt has not had a BM in 3-4 weeks (states this is normal for her) 6. CONTACTS: "Is there anyone else in the family with the same symptoms?"      no 7. CAUSE: "What do you think is causing your vomiting?"     antibiotic 8. HYDRATION STATUS: "Any signs of dehydration?" (e.g., dry mouth [not only dry lips], too weak to stand) "When did you last urinate?"     Yesterday morning 9. OTHER SYMPTOMS: "Do you have any other symptoms?" (e.g., fever, headache, vertigo, vomiting blood or coffee grounds, recent head injury)     weakness 10. PREGNANCY: "Is there any chance you are pregnant?" "When was your last menstrual period?"       no  Protocols used:  [facility]

## 2017-12-06 NOTE — ED Notes (Signed)
Pt went to her PCP Friday and was given atb (Cipro) - she was told if she could not keep the atb down to come to the ED - pt c/o N/V that has continued (10+ times in 24 hours) - pt c/o lower bad pain

## 2017-12-06 NOTE — Telephone Encounter (Signed)
Patient notified

## 2017-12-06 NOTE — Telephone Encounter (Signed)
Please let her know that her labs came back normal except her urine. Her belly labs were good. If she's not feeling any better when I recheck with her, we'll figure out next steps

## 2017-12-08 LAB — URINE CULTURE

## 2017-12-10 ENCOUNTER — Telehealth: Payer: Self-pay

## 2017-12-10 NOTE — Telephone Encounter (Signed)
Copied from [place] [id number]. Topic: Medicare AWV >> Dec 10, 2017  1:21 PM [place], [place] A, LPN wrote: Reason for CRM: called to reschedule medicare wellness visit on 12/13/2017 at 2:45pm

## 2017-12-12 ENCOUNTER — Other Ambulatory Visit: Payer: Self-pay | Admitting: Family Medicine

## 2017-12-13 ENCOUNTER — Ambulatory Visit: Payer: Medicare Other | Admitting: Family Medicine

## 2017-12-13 ENCOUNTER — Ambulatory Visit
Admission: RE | Admit: 2017-12-13 | Discharge: 2017-12-13 | Disposition: A | Discharge status: Home / Self Care | Payer: Medicare Other | Source: Ambulatory Visit | Attending: Family Medicine | Admitting: Family Medicine

## 2017-12-13 ENCOUNTER — Ambulatory Visit (INDEPENDENT_AMBULATORY_CARE_PROVIDER_SITE_OTHER): Payer: Medicare Other

## 2017-12-13 ENCOUNTER — Encounter: Payer: Self-pay | Admitting: Family Medicine

## 2017-12-13 VITALS — BP 125/83 | HR 60 | Temp 98.0°F | Resp 17 | Ht 63.0 in | Wt 196.9 lb

## 2017-12-13 VITALS — BP 125/83 | HR 60 | Temp 98.0°F | Ht 63.0 in | Wt 196.6 lb

## 2017-12-13 DIAGNOSIS — Z1231 Encounter for screening mammogram for malignant neoplasm of breast: Secondary | ICD-10-CM | POA: Diagnosis not present

## 2017-12-13 DIAGNOSIS — Z0000 Encounter for general adult medical examination without abnormal findings: Secondary | ICD-10-CM

## 2017-12-13 DIAGNOSIS — K5900 Constipation, unspecified: Secondary | ICD-10-CM

## 2017-12-13 DIAGNOSIS — R339 Retention of urine, unspecified: Secondary | ICD-10-CM | POA: Diagnosis not present

## 2017-12-13 DIAGNOSIS — N3946 Mixed incontinence: Secondary | ICD-10-CM

## 2017-12-13 DIAGNOSIS — Z1239 Encounter for other screening for malignant neoplasm of breast: Secondary | ICD-10-CM

## 2017-12-13 DIAGNOSIS — N3000 Acute cystitis without hematuria: Secondary | ICD-10-CM | POA: Diagnosis not present

## 2017-12-13 LAB — UA/M W/RFLX CULTURE, ROUTINE
BILIRUBIN UA: NEGATIVE
GLUCOSE, UA: NEGATIVE
Leukocytes, UA: NEGATIVE
Nitrite, UA: NEGATIVE
PROTEIN UA: NEGATIVE
RBC, UA: NEGATIVE
Specific Gravity, UA: 1.025 (ref 1.005–1.030)
Urobilinogen, Ur: 0.2 mg/dL (ref 0.2–1.0)
pH, UA: 5 (ref 5.0–7.5)

## 2017-12-13 MED ORDER — MAGNESIUM HYDROXIDE 400 MG/5ML PO SUSP: 30.0000 mL | Freq: Every day | ORAL | 6 refills | Status: DC | PRN

## 2017-12-13 MED ORDER — MAGNESIUM CITRATE PO SOLN: 148.0000 mL | Freq: Every day | ORAL | 6 refills | Status: DC

## 2017-12-13 NOTE — Patient Instructions (Signed)
Susan Houston , Thank you for taking time to come for your Medicare Wellness Visit. I appreciate your ongoing commitment to your health goals. Please review the following plan we discussed and let me know if I can assist you in the future.   Screening recommendations/referrals: Colonoscopy: completed 11/11/2010 Mammogram: Please call [phone] to schedule your mammogram.  Bone Density: due at age 57 Recommended yearly ophthalmology/optometry visit for glaucoma screening and checkup Recommended yearly dental visit for hygiene and checkup  Vaccinations: Influenza vaccine: up to date Pneumococcal vaccine: due at age 65 Tdap vaccine:  Up to date  Shingles vaccine: due, check with your insurance company for coverage  Advanced directives: Advance directive discussed with you today. I have provided a copy for you to complete at home and have notarized. Once this is complete please bring a copy in to our office so we can scan it into your chart.  Conditions/risks identified: Recommend to continue drinking at least 6-8 glasses of water a day.  Silver Sneakers: [phone]   Next appointment: Follow up in one year for your annual wellness exam  Preventive Care 40-64 Years, Female Preventive care refers to lifestyle choices and visits with your health care provider that can promote health and wellness. What does preventive care include?  A yearly physical exam. This is also called an annual well check.  Dental exams once or twice a year.  Routine eye exams. Ask your health care provider how often you should have your eyes checked.  Personal lifestyle choices, including:  Daily care of your teeth and gums.  Regular physical activity.  Eating a healthy diet.  Avoiding tobacco and drug use.  Limiting alcohol use.  Practicing safe sex.  Taking low-dose aspirin daily starting at age 16.  Taking vitamin and mineral supplements as recommended by your health care provider. What happens  during an annual well check? The services and screenings done by your health care provider during your annual well check will depend on your age, overall health, lifestyle risk factors, and family history of disease. Counseling  Your health care provider may ask you questions about your:  Alcohol use.  Tobacco use.  Drug use.  Emotional well-being.  Home and relationship well-being.  Sexual activity.  Eating habits.  Work and work Statistician.  Method of birth control.  Menstrual cycle.  Pregnancy history. Screening  You may have the following tests or measurements:  Height, weight, and BMI.  Blood pressure.  Lipid and cholesterol levels. These may be checked every 5 years, or more frequently if you are over 32 years old.  Skin check.  Lung cancer screening. You may have this screening every year starting at age 84 if you have a 30-pack-year history of smoking and currently smoke or have quit within the past 15 years.  Fecal occult blood test (FOBT) of the stool. You may have this test every year starting at age 24.  Flexible sigmoidoscopy or colonoscopy. You may have a sigmoidoscopy every 5 years or a colonoscopy every 10 years starting at age 41.  Hepatitis C blood test.  Hepatitis B blood test.  Sexually transmitted disease (STD) testing.  Diabetes screening. This is done by checking your blood sugar (glucose) after you have not eaten for a while (fasting). You may have this done every 1-3 years.  Mammogram. This may be done every 1-2 years. Talk to your health care provider about when you should start having regular mammograms. This may depend on whether you have a family  history of breast cancer.  BRCA-related cancer screening. This may be done if you have a family history of breast, ovarian, tubal, or peritoneal cancers.  Pelvic exam and Pap test. This may be done every 3 years starting at age 40. Starting at age 30, this may be done every 5 years if you  have a Pap test in combination with an HPV test.  Bone density scan. This is done to screen for osteoporosis. You may have this scan if you are at high risk for osteoporosis. Discuss your test results, treatment options, and if necessary, the need for more tests with your health care provider. Vaccines  Your health care provider may recommend certain vaccines, such as:  Influenza vaccine. This is recommended every year.  Tetanus, diphtheria, and acellular pertussis (Tdap, Td) vaccine. You may need a Td booster every 10 years.  Zoster vaccine. You may need this after age 80.  Pneumococcal 13-valent conjugate (PCV13) vaccine. You may need this if you have certain conditions and were not previously vaccinated.  Pneumococcal polysaccharide (PPSV23) vaccine. You may need one or two doses if you smoke cigarettes or if you have certain conditions. Talk to your health care provider about which screenings and vaccines you need and how often you need them. This information is not intended to replace advice given to you by your health care provider. Make sure you discuss any questions you have with your health care provider. Document Released: 11/01/2015 Document Revised: 06/24/2016 Document Reviewed: 08/06/2015 Elsevier Interactive Patient Education  2017 [place] Prevention in the Home Falls can cause injuries. They can happen to people of all ages. There are many things you can do to make your home safe and to help prevent falls. What can I do on the outside of my home?  Regularly fix the edges of walkways and driveways and fix any cracks.  Remove anything that might make you trip as you walk through a door, such as a raised step or threshold.  Trim any bushes or trees on the path to your home.  Use bright outdoor lighting.  Clear any walking paths of anything that might make someone trip, such as rocks or tools.  Regularly check to see if handrails are loose or broken.  Make sure that both sides of any steps have handrails.  Any raised decks and porches should have guardrails on the edges.  Have any leaves, snow, or ice cleared regularly.  Use sand or salt on walking paths during winter.  Clean up any spills in your garage right away. This includes oil or grease spills. What can I do in the bathroom?  Use night lights.  Install grab bars by the toilet and in the tub and shower. Do not use towel bars as grab bars.  Use non-skid mats or decals in the tub or shower.  If you need to sit down in the shower, use a plastic, non-slip stool.  Keep the floor dry. Clean up any water that spills on the floor as soon as it happens.  Remove soap buildup in the tub or shower regularly.  Attach bath mats securely with double-sided non-slip rug tape.  Do not have throw rugs and other things on the floor that can make you trip. What can I do in the bedroom?  Use night lights.  Make sure that you have a light by your bed that is easy to reach.  Do not use any sheets or blankets that are too  big for your bed. They should not hang down onto the floor.  Have a firm chair that has side arms. You can use this for support while you get dressed.  Do not have throw rugs and other things on the floor that can make you trip. What can I do in the kitchen?  Clean up any spills right away.  Avoid walking on wet floors.  Keep items that you use a lot in easy-to-reach places.  If you need to reach something above you, use a strong step stool that has a grab bar.  Keep electrical cords out of the way.  Do not use floor polish or wax that makes floors slippery. If you must use wax, use non-skid floor wax.  Do not have throw rugs and other things on the floor that can make you trip. What can I do with my stairs?  Do not leave any items on the stairs.  Make sure that there are handrails on both sides of the stairs and use them. Fix handrails that are broken or  loose. Make sure that handrails are as long as the stairways.  Check any carpeting to make sure that it is firmly attached to the stairs. Fix any carpet that is loose or worn.  Avoid having throw rugs at the top or bottom of the stairs. If you do have throw rugs, attach them to the floor with carpet tape.  Make sure that you have a light switch at the top of the stairs and the bottom of the stairs. If you do not have them, ask someone to add them for you. What else can I do to help prevent falls?  Wear shoes that:  Do not have high heels.  Have rubber bottoms.  Are comfortable and fit you well.  Are closed at the toe. Do not wear sandals.  If you use a stepladder:  Make sure that it is fully opened. Do not climb a closed stepladder.  Make sure that both sides of the stepladder are locked into place.  Ask someone to hold it for you, if possible.  Clearly mark and make sure that you can see:  Any grab bars or handrails.  First and last steps.  Where the edge of each step is.  Use tools that help you move around (mobility aids) if they are needed. These include:  Canes.  Walkers.  Scooters.  Crutches.  Turn on the lights when you go into a dark area. Replace any light bulbs as soon as they burn out.  Set up your furniture so you have a clear path. Avoid moving your furniture around.  If any of your floors are uneven, fix them.  If there are any pets around you, be aware of where they are.  Review your medicines with your doctor. Some medicines can make you feel dizzy. This can increase your chance of falling. Ask your doctor what other things that you can do to help prevent falls. This information is not intended to replace advice given to you by your health care provider. Make sure you discuss any questions you have with your health care provider. Document Released: 08/01/2009 Document Revised: 03/12/2016 Document Reviewed: 11/09/2014 Elsevier Interactive  Patient Education  2017 [employer].

## 2017-12-13 NOTE — Progress Notes (Signed)
Subjective:   Susan Houston is a 57 y.o. female who presents for Medicare Annual (Subsequent) preventive examination.  Review of Systems:  Cardiac Risk Factors include: hypertension;advanced age (>82men, >36 women);dyslipidemia;obesity (BMI >30kg/m2)     Objective:     Vitals: BP 125/83 (BP Location: Left Arm, Patient Position: Sitting)   Pulse 60   Temp 98 F (36.7 C) (Temporal)   Resp 17   Ht 5\' 3"  (1.6 m)   Wt 196 lb 14.4 oz (89.3 kg)   SpO2 100%   BMI 34.88 kg/m   Body mass index is 34.88 kg/m.  Advanced Directives 12/13/2017 12/06/2017 08/28/2017 11/20/2016 07/10/2016 06/24/2016 06/03/2016  Does Patient Have a Medical Advance Directive? No No No No Yes No No  Type of Advance Directive - - - - [place]  Would patient like information on creating a medical advance directive? Yes (MAU/Ambulatory/Procedural Areas - Information given) No - Patient declined No - Patient declined No - Patient declined - No - patient declined information No - patient declined information    Tobacco Social History   Tobacco Use  Smoking Status Former Smoker  . Packs/day: 0.50  . Types: Cigarettes  . Last attempt to quit: 04/07/2016  . Years since quitting: 1.6  Smokeless Tobacco Never Used  Tobacco Comment   patient has not smoked x 10 days.06/28/17 chantix     Counseling given: Not Answered Comment: patient has not smoked x 10 days.06/28/17 chantix   Clinical Intake:  Pre-visit preparation completed: Yes  Pain : No/denies pain     Nutritional Status: BMI > 30  Obese Nutritional Risks: None Diabetes: No  How often do you need to have someone help you when you read instructions, pamphlets, or other written materials from your doctor or pharmacy?: 1 - Never What is the last grade level you completed in school?: graduated high school  Interpreter Needed?: No  Information entered by :: Areej Tayler,LPN   Past Medical History:  Diagnosis Date  . Anxiety   .  Arthritis   . Asthma   . Back pain   . Bruises easily   . 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 Chronic leg pain    BILATERAL  . COPD (chronic obstructive pulmonary disease) ([place])   . Depression   . Difficulty sleeping   . Diverticulitis   . Hemorrhoids   . History of kidney stones   . Hyperlipidemia   . Hypertension   . Incontinence of urine   . Migraine   . MRSA infection greater than 3 months ago    2008 right side of face  . Nerve damage    right leg   Past Surgical History:  Procedure Laterality Date  . ABDOMINAL HYSTERECTOMY  1983  . BLADDER SUSPENSION  2014  . CHOLECYSTECTOMY    . COLONOSCOPY    . ESOPHAGOGASTRODUODENOSCOPY    . ESOPHAGOGASTRODUODENOSCOPY (EGD) WITH PROPOFOL N/A 11/20/2016   Procedure: ESOPHAGOGASTRODUODENOSCOPY (EGD) WITH PROPOFOL;  Surgeon: Jonathon Bellows, MD;  Location: [facility] ENDOSCOPY;  Service: Endoscopy;  Laterality: N/A;  . EXCISION NEUROMA Bilateral 09/24/2014   Procedure: BILATERAL OPEN SAPHENOUS NEURECTOMIES AND OPEN LEFT PERIPATELLA OSETOPHYTECTOMY;  Surgeon: Mauri Pole, MD;  Location: [facility];  Service: Orthopedics;  Laterality: Bilateral;  . JOINT REPLACEMENT  2009/2010   BIL TOTAL KNEES  . TONSILLECTOMY     Family History  Problem Relation Age of Onset  . Asthma Mother   .  Diabetes Mother   . Hyperlipidemia Mother   . Hypertension Mother   . Cirrhosis Mother        Non-alcoholic  . Arthritis Father   . Cancer Father        Bone  . Hyperlipidemia Father   . Hypertension Father   . Kidney Stones Son   . Stroke Maternal Grandmother   . Cancer Maternal Grandfather        lung  . Pneumonia Paternal Grandmother   . Alzheimer's disease Paternal Grandfather   . Diabetes Sister   . Gout Sister   . Hypertension Sister   . Alcohol abuse Brother   . Heart disease Brother        massive MI   Social History   Socioeconomic History  . Marital status: Married    Spouse name: Tharon Aquas  . Number of  children: 2  . Years of education: 48  . Highest education level: None  Social Needs  . Financial resource strain: Not hard at all  . Food insecurity - worry: Never true  . Food insecurity - inability: Never true  . Transportation needs - medical: No  . Transportation needs - non-medical: No  Occupational History  . Occupation: Disabled  Tobacco Use  . Smoking status: Former Smoker    Packs/day: 0.50    Types: Cigarettes    Last attempt to quit: 04/07/2016    Years since quitting: 1.6  . Smokeless tobacco: Never Used  . Tobacco comment: patient has not smoked x 10 days.06/28/17 chantix  Substance and Sexual Activity  . Alcohol use: No    Alcohol/week: 0.0 oz  . Drug use: No  . Sexual activity: No  Other Topics Concern  . None  Social History Narrative   Lives at home w/ her husband   Right-handed   Caffeine: occasionally    Outpatient Encounter Medications as of 12/13/2017  Medication Sig  . aspirin 81 MG tablet Take 81 mg by mouth daily.  . busPIRone (BUSPAR) 15 MG tablet Take 15 mg by mouth daily.  . carbidopa-levodopa (SINEMET) 25-100 MG tablet Take by mouth.  . CHANTIX 1 MG tablet   . diclofenac (CATAFLAM) 50 MG tablet TAKE 1 TABLET(50 MG) BY MOUTH THREE TIMES DAILY AS NEEDED  . docusate sodium (COLACE) 100 MG capsule Take 1 tablet once or twice daily as needed for constipation while taking narcotic pain medicine  . doxepin (SINEQUAN) 50 MG capsule TK 1 OR 2 CS PO QHS PRN  . fluticasone (FLONASE) 50 MCG/ACT nasal spray INSTILL 2 SPRAYS IEN QD  . Fluticasone-Salmeterol (ADVAIR DISKUS) 250-50 MCG/DOSE AEPB Inhale 1 puff into the lungs 2 (two) times daily.  Marland Kitchen gabapentin (NEURONTIN) 300 MG capsule Take 300 mg by mouth 3 (three) times daily.  Marland Kitchen ipratropium-albuterol (DUONEB) 0.5-2.5 (3) MG/3ML SOLN Take 3 mLs by nebulization every 4 (four) hours as needed.  Marland Kitchen LORazepam (ATIVAN) 0.5 MG tablet Take by mouth. 2 tablets at bedtime Reported on 02/26/2016  . metoCLOPramide  (REGLAN) 10 MG tablet Take 1 tablet (10 mg total) by mouth every 8 (eight) hours as needed for nausea or vomiting.  . nystatin ointment (MYCOSTATIN) Apply 1 application topically 2 (two) times daily.  . SUMAtriptan (IMITREX) 100 MG tablet TAKE 1 TAB BY MOUTH AT START OF MIGRAINE. MAY REPEAT IN 2 HOURS IF HEADACHE PERSISTS OR RECURS. MAX: 2 TABS IN 24 HRS.  Marland Kitchen tiZANidine (ZANAFLEX) 4 MG tablet TAKE 1 TABLET BY MOUTH THREE TIMES DAILY AS NEEDED  .  topiramate (TOPAMAX) 100 MG tablet TAKE 1 TABLET(100 MG) BY MOUTH TWICE DAILY  . traZODone (DESYREL) 150 MG tablet Take 150 mg by mouth daily.  . divalproex (DEPAKOTE ER) 250 MG 24 hr tablet 1 po hs for 2 weeks then increase to 2 tabs daily. Stay on medication until f/u appt (Patient not taking: Reported on 12/13/2017)  . omeprazole (PRILOSEC) 40 MG capsule Take 1 capsule (40 mg total) by mouth daily.  . [DISCONTINUED] ciprofloxacin (CIPRO) 500 MG tablet Take 1 tablet (500 mg total) by mouth 2 (two) times daily. (Patient not taking: Reported on 12/13/2017)   No facility-administered encounter medications on file as of 12/13/2017.     Activities of Daily Living In your present state of health, do you have any difficulty performing the following activities: 12/13/2017  Hearing? N  Vision? Y  Difficulty concentrating or making decisions? Y  Walking or climbing stairs? Y  Dressing or bathing? Y  Comment husbands assists  Doing errands, shopping? Y  Comment husband does  Conservation officer, nature and eating ? N  Using the Toilet? N  In the past six months, have you accidently leaked urine? Y  Comment wears protection  Do you have problems with loss of bowel control? N  Managing your Medications? Y  Comment husband helps  Managing your Finances? Y  Comment husband helps   Housekeeping or managing your Housekeeping? Y  Comment husband helps  Some recent data might be hidden    Patient Care Team: Valerie Roys, DO as PCP - General (Family Medicine) Anabel Bene, MD as Referring Physician (Neurology)    Assessment:   This is a routine wellness examination for [place].  Exercise Activities and Dietary recommendations Current Exercise Habits: Home exercise routine, Type of exercise: walking;stretching(walking occasional), Time (Minutes): 60, Frequency (Times/Week): 7, Weekly Exercise (Minutes/Week): 420, Intensity: Mild, Exercise limited by: None identified  Goals    . DIET - INCREASE WATER INTAKE      Recommend to continue drinking at least 6-8 glasses of water a day.       Fall Risk Fall Risk  12/13/2017 06/28/2017 07/10/2016 06/24/2016 06/03/2016  Falls in the past year? Yes No Yes No (No Data)  Comment - - - - no falls since last visit   Number falls in past yr: 1 - 2 or more - -  Comment - - - - -  Injury with Fall? Yes - No - -  Comment - - - - -  Risk Factor Category  High Fall Risk - - - -  Risk for fall due to : Impaired balance/gait - - - -  Follow up Falls prevention discussed - - - -  Comment - - - - -   Is the patient's home free of loose throw rugs in walkways, pet beds, electrical cords, etc?   yes      Grab bars in the bathroom? yes      Handrails on the stairs?   yes      Adequate lighting?   yes  Timed Get Up and Go performed: Completed in 10 seconds with  use of assistive devices- cane , unsteady gait. Will inform MD, patient requesting walker with seat.    Depression Screen PHQ 2/9 Scores 12/13/2017 05/26/2017 07/10/2016 06/24/2016  PHQ - 2 Score 0 3 3 -  PHQ- 9 Score 7 14 7  -  Exception Documentation - - - Patient refusal     Cognitive Function     6CIT  Screen 12/13/2017  What Year? 0 points  What month? 0 points  What time? 0 points  Count back from 20 2 points  Months in reverse 0 points  Repeat phrase 10 points  Total Score 12    Immunization History  Administered Date(s) Administered  . Influenza-Unspecified 07/25/2015, 08/22/2016, 07/27/2017  . Pneumococcal Polysaccharide-23 08/26/2011  . Td  10/20/2007  . Tdap 08/28/2017    Qualifies for Shingles Vaccine?discussed shingrix vaccine   Screening Tests Health Maintenance  Topic Date Due  . MAMMOGRAM  07/11/2016  . HIV Screening  05/26/2018 (Originally 11/17/1975)  . PNEUMOCOCCAL POLYSACCHARIDE VACCINE (2) 08/25/2025 (Originally 08/25/2016)  . COLONOSCOPY  11/11/2020  . TETANUS/TDAP  08/29/2027  . INFLUENZA VACCINE  Completed  . Hepatitis C Screening  Completed    Cancer Screenings: Lung: Low Dose CT Chest recommended if Age 41-80 years, 30 pack-year currently smoking OR have quit w/in 15years. Patient does not qualify. Has scan done in June 2018 Breast:  Up to date on Mammogram? No  Order placed Up to date of Bone Density/Dexa? No due at age 62  Colorectal: completed 11/11/2010, due in 2022  Additional Screenings:  Hepatitis B/HIV/Syphillis: not indicated Hepatitis C Screening:  Completed 03/03/2016     Plan:    I have personally reviewed and addressed the Medicare Annual Wellness questionnaire and have noted the following in the patient's chart:  A. Medical and social history B. Use of alcohol, tobacco or illicit drugs  C. Current medications and supplements D. Functional ability and status E.  Nutritional status F.  Physical activity G. Advance directives H. List of other physicians I.  Hospitalizations, surgeries, and ER visits in previous 12 months J.  [place] such as hearing and vision if needed, cognitive and depression L. Referrals and appointments   In addition, I have reviewed and discussed with patient certain preventive protocols, quality metrics, and best practice recommendations. A written personalized care plan for preventive services as well as general preventive health recommendations were provided to patient.   Signed,  Tyler Aas, LPN Nurse Health Advisor   Nurse Notes: Fall risk discussed, patient requesting walker with seat. Currently uses cane.

## 2017-12-13 NOTE — Progress Notes (Signed)
BP 125/83 (BP Location: Left Arm, Patient Position: Sitting, Cuff Size: Normal)   Pulse 60   Temp 98 F (36.7 C)   Ht 5\' 3"  (1.6 m)   Wt 196 lb 9 oz (89.2 kg)   SpO2 100%   BMI 34.82 kg/m    Subjective:    Patient ID: Susan Houston, female    DOB: 09/11/1961, 57 y.o.   MRN: [id number]  HPI: [name] is a 57 y.o. female  Chief Complaint  Patient presents with  . Nausea   Nausea is doing much better. Feeling better every   URINARY SYMPTOMS Duration: today Dysuria: no Urinary frequency: yes- at night, getting up 6-7x a night Urgency: yes Small volume voids: no Symptom severity: moderate Urinary incontinence: no Foul odor: yes Hematuria: no Abdominal pain: yes Back pain: yes Suprapubic pain/pressure: yes Flank pain: no Fever:  no Vomiting: no Relief with cranberry juice: no Relief with pyridium: no Status: worse Previous urinary tract infection: yes Recurrent urinary tract infection: no Sexual activity: monogomous History of sexually transmitted disease: no Vaginal discharge: no Treatments attempted: antibiotics and increasing fluids   ABDOMINAL ISSUES Duration: 5 weeks Nature: aching and cramping Location: diffuse  Severity: mild  Radiation: no Frequency: constant Treatments attempted: none Constipation: yes Diarrhea: no  Mucous in the stool: no Heartburn: no Bloating:yes Flatulence: yes Nausea: yes Vomiting: no Melena or hematochezia: no Rash: no Jaundice: no Fever: no Weight loss: no  Relevant past medical, surgical, family and social history reviewed and updated as indicated. Interim medical history since our last visit reviewed. Allergies and medications reviewed and updated.  Review of Systems  Constitutional: Negative.   Respiratory: Negative.   Cardiovascular: Negative.   Gastrointestinal: Positive for abdominal distention and constipation. Negative for abdominal pain, anal bleeding, blood in stool, diarrhea, nausea, rectal pain and  vomiting.  Genitourinary: Positive for decreased urine volume, difficulty urinating, frequency and urgency. Negative for dyspareunia, dysuria, enuresis, flank pain, genital sores, hematuria, menstrual problem, pelvic pain, vaginal bleeding, vaginal discharge and vaginal pain.  Musculoskeletal: Negative.   Skin: Negative.   Psychiatric/Behavioral: Negative.     Per HPI unless specifically indicated above     Objective:    BP 125/83 (BP Location: Left Arm, Patient Position: Sitting, Cuff Size: Normal)   Pulse 60   Temp 98 F (36.7 C)   Ht 5\' 3"  (1.6 m)   Wt 196 lb 9 oz (89.2 kg)   SpO2 100%   BMI 34.82 kg/m   Wt Readings from Last 3 Encounters:  12/13/17 196 lb 9 oz (89.2 kg)  12/13/17 196 lb 14.4 oz (89.3 kg)  12/06/17 195 lb (88.5 kg)    Physical Exam  Constitutional: She is oriented to person, place, and time. She appears well-developed and well-nourished. No distress.  HENT:  Head: Normocephalic and atraumatic.  Right Ear: Hearing normal.  Left Ear: Hearing normal.  Nose: Nose normal.  Eyes: Conjunctivae and lids are normal. Right eye exhibits no discharge. Left eye exhibits no discharge. No scleral icterus.  Cardiovascular: Normal rate, regular rhythm, normal heart sounds and intact distal pulses. Exam reveals no gallop and no friction rub.  No murmur heard. Pulmonary/Chest: Effort normal and breath sounds normal. No respiratory distress. She has no wheezes. She has no rales. She exhibits no tenderness.  Abdominal: Soft. Bowel sounds are normal. She exhibits no distension and no mass. There is no tenderness. There is no rebound and no guarding.  Musculoskeletal: Normal range of motion.  Neurological: She is alert and oriented to person, place, and time.  Skin: Skin is warm, dry and intact. No rash noted. She is not diaphoretic. No erythema. No pallor.  Psychiatric: She has a normal mood and affect. Her speech is normal and behavior is normal. Judgment and thought content  normal. Cognition and memory are normal.  Nursing note and vitals reviewed.   Results for orders placed or performed during the hospital encounter of 12/06/17  Urine Culture  Result Value Ref Range   Specimen Description      URINE, RANDOM Performed at [facility], [address] [place], [place] [address]    Special Requests      NONE Performed at [facility], [address] [place], [place] [address]    Culture (A)     <10,000 COLONIES/mL INSIGNIFICANT GROWTH Performed at [facility] [address], [place], [place] [address]    Report Status 12/08/2017 FINAL   Lipase, blood  Result Value Ref Range   Lipase 27 11 - 51 U/L  Comprehensive metabolic panel  Result Value Ref Range   Sodium 136 135 - 145 mmol/L   Potassium 3.5 3.5 - 5.1 mmol/L   Chloride 105 101 - 111 mmol/L   CO2 22 22 - 32 mmol/L   Glucose, Bld 122 (H) 65 - 99 mg/dL   BUN 5 (L) 6 - 20 mg/dL   Creatinine, Ser 0.81 0.44 - 1.00 mg/dL   Calcium 9.0 8.9 - 10.3 mg/dL   Total Protein 7.5 6.5 - 8.1 g/dL   Albumin 3.7 3.5 - 5.0 g/dL   AST 30 15 - 41 U/L   ALT 6 (L) 14 - 54 U/L   Alkaline Phosphatase 80 38 - 126 U/L   Total Bilirubin 0.9 0.3 - 1.2 mg/dL   GFR calc non Af Amer >60 >60 mL/min   GFR calc Af Amer >60 >60 mL/min   Anion gap 9 5 - 15  CBC  Result Value Ref Range   WBC 4.1 3.6 - 11.0 K/uL   RBC 4.43 3.80 - 5.20 MIL/uL   Hemoglobin 13.5 12.0 - 16.0 g/dL   HCT 39.8 35.0 - 47.0 %   MCV 89.8 80.0 - 100.0 fL   MCH 30.4 26.0 - 34.0 pg   MCHC 33.9 32.0 - 36.0 g/dL   RDW 13.8 11.5 - 14.5 %   Platelets 231 150 - 440 K/uL  Urinalysis, Complete w Microscopic  Result Value Ref Range   Color, Urine YELLOW (A) YELLOW   APPearance CLEAR (A) CLEAR   Specific Gravity, Urine 1.012 1.005 - 1.030   pH 5.0 5.0 - 8.0   Glucose, UA NEGATIVE NEGATIVE mg/dL   Hgb urine dipstick NEGATIVE NEGATIVE   Bilirubin Urine NEGATIVE NEGATIVE   Ketones, ur NEGATIVE NEGATIVE mg/dL   Protein, ur  NEGATIVE NEGATIVE mg/dL   Nitrite NEGATIVE NEGATIVE   Leukocytes, UA NEGATIVE NEGATIVE   RBC / HPF 0-5 0 - 5 RBC/hpf   WBC, UA 0-5 0 - 5 WBC/hpf   Bacteria, UA RARE (A) NONE SEEN   Squamous Epithelial / LPF 0-5 (A) NONE SEEN   Mucus PRESENT    Hyaline Casts, UA PRESENT       Assessment & Plan:   Problem List Items Addressed This Visit    None    Visit Diagnoses    Acute cystitis without hematuria    -  Primary   UA negative at this time. Continue fluids. Call with  any concerns.    Relevant Orders   UA/M w/rflx Culture, Routine   Ambulatory referral to Urology   Incomplete bladder emptying       No better. Will get her into urology for evaluation. ? due to her constipation right now.    Relevant Orders   Ambulatory referral to Urology   Mixed incontinence       Timed voidings discussed. Will get into urology. Call with any concerns.    Relevant Orders   Ambulatory referral to Urology   Constipation, unspecified constipation type       Probably causing bladder irritation. Will get x-ray and get her moving. Call if no BM and will get her enema.   Relevant Orders   DG Abd 2 Views       Follow up plan: Return in about 4 weeks (around 01/10/2018) for follow up.

## 2017-12-14 ENCOUNTER — Telehealth: Payer: Self-pay | Admitting: Family Medicine

## 2017-12-14 NOTE — Telephone Encounter (Signed)
Patient states that she took her first dose this morning, but nothing has happened yet.

## 2017-12-14 NOTE — Telephone Encounter (Signed)
Please let her know that she is definitely constipated and see if she's done any better with the medicine we sent through. Thanks!

## 2017-12-14 NOTE — Telephone Encounter (Signed)
Noted  

## 2017-12-15 DIAGNOSIS — E538 Deficiency of other specified B group vitamins: Secondary | ICD-10-CM | POA: Insufficient documentation

## 2017-12-15 DIAGNOSIS — R296 Repeated falls: Secondary | ICD-10-CM | POA: Diagnosis not present

## 2017-12-15 DIAGNOSIS — R251 Tremor, unspecified: Secondary | ICD-10-CM | POA: Diagnosis not present

## 2017-12-15 DIAGNOSIS — G3184 Mild cognitive impairment, so stated: Secondary | ICD-10-CM | POA: Diagnosis not present

## 2017-12-15 NOTE — Telephone Encounter (Signed)
Wonderful! Have her continue to use the medicine until she is having bowel movements daily, then we can talk about starting her on something to help with her constipation.

## 2017-12-15 NOTE — Telephone Encounter (Signed)
Pt called in to make provider aware that she was able to use the stool. It wasn't much but she was able to .

## 2017-12-16 NOTE — Telephone Encounter (Signed)
Patient notified

## 2017-12-21 ENCOUNTER — Other Ambulatory Visit: Payer: Self-pay | Admitting: Family Medicine

## 2017-12-22 ENCOUNTER — Other Ambulatory Visit: Payer: Self-pay

## 2017-12-22 ENCOUNTER — Encounter: Payer: Self-pay | Admitting: Psychiatry

## 2017-12-22 ENCOUNTER — Ambulatory Visit (INDEPENDENT_AMBULATORY_CARE_PROVIDER_SITE_OTHER): Payer: Medicare Other | Admitting: Psychiatry

## 2017-12-22 VITALS — BP 155/84 | HR 97

## 2017-12-22 DIAGNOSIS — G3184 Mild cognitive impairment, so stated: Secondary | ICD-10-CM | POA: Diagnosis not present

## 2017-12-22 DIAGNOSIS — F05 Delirium due to known physiological condition: Secondary | ICD-10-CM

## 2017-12-22 DIAGNOSIS — F330 Major depressive disorder, recurrent, mild: Secondary | ICD-10-CM

## 2017-12-22 MED ORDER — DOXEPIN HCL 25 MG PO CAPS: 25.0000 mg | ORAL_CAPSULE | Freq: Every day | ORAL | 1 refills | Status: DC

## 2017-12-22 MED ORDER — TRAZODONE HCL 100 MG PO TABS
100.0000 mg | ORAL_TABLET | Freq: Every day | ORAL | 1 refills | Status: DC
Start: 2017-12-22 — End: 2018-01-06

## 2017-12-22 NOTE — Progress Notes (Signed)
Psychiatric Initial Adult Assessment   Patient Identification: Susan Houston MRN:  [id number] Date of Evaluation:  12/22/2017 Referral Source: Anabel Bene MD Chief Complaint:  ' I need to change my medications.' Chief Complaint    Establish Care; Anxiety; Depression     Visit Diagnosis:    ICD-10-CM   1. MDD (major depressive disorder), recurrent episode, mild (HCC) F33.0 traZODone (DESYREL) 100 MG tablet    doxepin (SINEQUAN) 25 MG capsule  2. Delirium due to multiple etiologies F05    resolved  3. MCI (mild cognitive impairment) G31.84     History of Present Illness:  Susan Houston is a 57 year old Caucasian female, who is married, unemployed, lives in [place], has a history of depression, vitamin B12 deficiency, mild cognitive impairment, migraine, COPD, chronic pain, degenerative disc disease, tremors, hyperlipidemia, presented to the clinic today to establish care.  Susan Houston today presented along with her husband Susan Houston.  She was evaluated individually first half of the session and her husband joined in later on.  Susan Houston today reported that she has been dealing with depression since the past 13 years or more.  Patient reports she used to see Dr. Kasandra Knudsen in the past.  Patient was with Dr. Kasandra Knudsen for a long time.  Patient reports that she has been on medications like doxepin, trazodone, lorazepam, BuSpar since the past several years.  Patient reports she was doing fair until January of this year.  Patient reports that in January and February she went through a period of complete confusion, memory loss, withdrawn, hypersomnia and hallucinations.  Patient reports that she would stay in her bed all day and all night and would sleep a lot during that time.  Patient reports she would barely eat or drink anything and also was constipated for at least 5 weeks or so.  Patient started having hallucinations and would talk to her dead parents as well as had visual hallucinations of people in her room.  Patient was  eventually taken to her primary medical doctor who diagnosed her with severe UTI.  Patient reports she was treated for the same.  Patient also was diagnosed with vitamin B12 deficiency and is currently on replacement.  Patient reports that her symptoms resolved since then and now she is more clear in her thinking, does not have confusion or memory problems like she did at that time.  Patient reports she developed some tremors which she felt was getting worse.  Patient also had a fall due to being unstable and hence her neurologist made some medication changes.  Patient was taking Topamax and Depakote for her migraine headaches.  Patient was asked to take a reduced dose of Topamax after her recent episode of memory problems confusion and falls.  Patient continues to be on the Depakote as well as Imitrex at this time.  Patient was also started on a medication called Sinemet for possible parkinsonian symptoms.  However per neurology notes review patient's tremors are likely drug-induced and not true parkinsonism.  Patient currently reports her mood symptoms as stable.  Patient denies any sleep problems.  Patient denies any hallucinations.  Patient denies any suicidality.  Patient denies any history of trauma.  Patient denies any substance abuse problems at this time.  Associated Signs/Symptoms: Depression Symptoms:  fatigue, (Hypo) Manic Symptoms:  denies Anxiety Symptoms:  denies Psychotic Symptoms:  denies PTSD Symptoms: Negative  Past Psychiatric History: Has a history of depression dating back to 13 years ago.  Patient reports she had some marriage stressors  at that time and she had multiple inpatient admissions at [facility], [facility] in [place].  Patient denies any suicidality.  Previous Psychotropic Medications: Yes , lexapro  Substance Abuse History in the last 12 months:  No.  Consequences of Substance Abuse: Negative  Past Medical History:  Past Medical History:  Diagnosis Date  .  Anxiety   . Arthritis   . Asthma   . Back pain   . Bruises easily   . 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 Chronic leg pain    BILATERAL  . COPD (chronic obstructive pulmonary disease) ([place])   . Depression   . Difficulty sleeping   . Diverticulitis   . Hemorrhoids   . History of kidney stones   . Hyperlipidemia   . Hypertension   . Incontinence of urine   . Migraine   . MRSA infection greater than 3 months ago    2008 right side of face  . Nerve damage    right leg    Past Surgical History:  Procedure Laterality Date  . ABDOMINAL HYSTERECTOMY  1983  . BLADDER SUSPENSION  2014  . CHOLECYSTECTOMY    . COLONOSCOPY    . ESOPHAGOGASTRODUODENOSCOPY    . ESOPHAGOGASTRODUODENOSCOPY (EGD) WITH PROPOFOL N/A 11/20/2016   Procedure: ESOPHAGOGASTRODUODENOSCOPY (EGD) WITH PROPOFOL;  Surgeon: Jonathon Bellows, MD;  Location: [facility] ENDOSCOPY;  Service: Endoscopy;  Laterality: N/A;  . EXCISION NEUROMA Bilateral 09/24/2014   Procedure: BILATERAL OPEN SAPHENOUS NEURECTOMIES AND OPEN LEFT PERIPATELLA OSETOPHYTECTOMY;  Surgeon: Mauri Pole, MD;  Location: [facility] ORS;  Service: Orthopedics;  Laterality: Bilateral;  . JOINT REPLACEMENT  2009/2010   BIL TOTAL KNEES  . TONSILLECTOMY      Family Psychiatric History: Sister-mental health problems, 3 brothers- alcoholism( deceased)  Family History:  Family History  Problem Relation Age of Onset  . Asthma Mother   . Diabetes Mother   . Hyperlipidemia Mother   . Hypertension Mother   . Cirrhosis Mother        Non-alcoholic  . Arthritis Father   . Cancer Father        Bone  . Hyperlipidemia Father   . Hypertension Father   . Anxiety disorder Sister   . Depression Sister   . Kidney Stones Son   . Stroke Maternal Grandmother   . Cancer Maternal Grandfather        lung  . Pneumonia Paternal Grandmother   . Alzheimer's disease Paternal Grandfather   . Diabetes Sister   . Gout Sister   . Hypertension Sister   .  Alcohol abuse Brother   . Heart disease Brother        massive MI    Social History:   Social History   Socioeconomic History  . Marital status: Married    Spouse name: Susan Houston  . Number of children: 2  . Years of education: 90  . Highest education level: 12th grade  Social Needs  . Financial resource strain: Not hard at all  . Food insecurity - worry: Never true  . Food insecurity - inability: Never true  . Transportation needs - medical: No  . Transportation needs - non-medical: No  Occupational History  . Occupation: Disabled  Tobacco Use  . Smoking status: Former Smoker    Packs/day: 0.50    Types: Cigarettes    Last attempt to quit: 04/07/2016    Years since quitting: 1.7  . Smokeless tobacco: Never Used  .  Tobacco comment: patient has not smoked x 10 days.06/28/17 chantix  Substance and Sexual Activity  . Alcohol use: No    Alcohol/week: 0.0 oz  . Drug use: No  . Sexual activity: No  Other Topics Concern  . None  Social History Narrative   Lives at home w/ her husband   Right-handed   Caffeine: occasionally    Additional Social History: Patient reports she was mainly raised by her mother and stepdad.  Her biological father left when she was 10 years old.  She was later on adopted by her stepdad.  She went up to 12th grade.  She went to [employer] and served for 4 years.  She had an honorable discharge.  She has been married to her husband since the past 34 years.  She has 3 children all together, one from her current husband and 2 from her ex-husband.  All her children are adults.  She has 1 grandchild-[name] who is 15 years old.  She helps take care of her granddaughter every day and helps her to get ready for school and also takes care of her when she comes back most days.  Allergies:   Allergies  Allergen Reactions  . Lexapro [Escitalopram Oxalate] Other (See Comments)    Keeps awake for days.   . Morphine And Related Itching    Metabolic Disorder Labs: No  results found for: HGBA1C, MPG No results found for: PROLACTIN Lab Results  Component Value Date   CHOL 169 12/03/2017   TRIG 126 12/03/2017   HDL 45 12/03/2017   LDLCALC 99 12/03/2017   LDLCALC 87 07/10/2016     Current Medications: Current Outpatient Medications  Medication Sig Dispense Refill  . aspirin 81 MG tablet Take 81 mg by mouth daily.    . busPIRone (BUSPAR) 15 MG tablet Take 15 mg by mouth daily.    Marland Kitchen gabapentin (NEURONTIN) 300 MG capsule Take 300 mg by mouth 3 (three) times daily.    Marland Kitchen LORazepam (ATIVAN) 0.5 MG tablet Take by mouth. 2 tablets at bedtime Reported on 02/26/2016    . magnesium citrate solution Take 148 mLs by mouth daily. If not better on the milk of magnesia, try this 300 mL 6  . magnesium hydroxide (MILK OF MAGNESIA) 400 MG/5ML suspension Take 30 mLs by mouth daily as needed for mild constipation. 360 mL 6  . nystatin ointment (MYCOSTATIN) Apply 1 application topically 2 (two) times daily. 30 g 2  . ondansetron (ZOFRAN-ODT) 4 MG disintegrating tablet DISSOLVE 1 TABLET(4 MG) ON THE TONGUE EVERY 12 HOURS AS NEEDED FOR NAUSEA    . SUMAtriptan (IMITREX) 100 MG tablet TAKE 1 TAB BY MOUTH AT START OF MIGRAINE. MAY REPEAT IN 2 HOURS IF HEADACHE PERSISTS OR RECURS. MAX: 2 TABS IN 24 HRS. 10 tablet 0  . tiZANidine (ZANAFLEX) 4 MG tablet TAKE 1 TABLET BY MOUTH THREE TIMES DAILY AS NEEDED 270 tablet 0  . topiramate (TOPAMAX) 100 MG tablet TAKE 1 TABLET(100 MG) BY MOUTH TWICE DAILY 60 tablet 0  . vitamin B-12 (CYANOCOBALAMIN) 1000 MCG tablet Take 1,000 mcg by mouth daily.    Marland Kitchen doxepin (SINEQUAN) 25 MG capsule Take 1 capsule (25 mg total) by mouth at bedtime. 30 capsule 1  . omeprazole (PRILOSEC) 40 MG capsule Take 1 capsule (40 mg total) by mouth daily. 90 capsule 0  . traZODone (DESYREL) 100 MG tablet Take 1 tablet (100 mg total) by mouth at bedtime. 30 tablet 1   No current facility-administered  medications for this visit.     Neurologic: Headache: Yes Seizure:  No Paresthesias:No  Musculoskeletal: Strength & Muscle Tone: within normal limits Gait & Station: normal Patient leans: N/A  Psychiatric Specialty Exam: Review of Systems  Psychiatric/Behavioral: Positive for depression (currently stable).  All other systems reviewed and are negative.   Blood pressure (!) 155/84, pulse 97.There is no height or weight on file to calculate BMI.  General Appearance: Casual  Eye Contact:  Fair  Speech:  Clear and Coherent  Volume:  Normal  Mood:  Euthymic  Affect:  Congruent  Thought Process:  Goal Directed and Descriptions of Associations: Intact  Orientation:  Full (Time, Place, and Person)  Thought Content:  Logical  Suicidal Thoughts:  No  Homicidal Thoughts:  No  Memory:  Immediate;   Fair Recent;   Fair Remote;   Fair  Judgement:  Fair  Insight:  Fair  Psychomotor Activity:  Normal  Concentration:  Concentration: Fair and Attention Span: Fair  Recall:  [employer] of Knowledge:Fair  Language: Fair  Akathisia:  No  Handed:  Right  AIMS (if indicated):  Mild tremors  Assets:  Communication Skills Desire for Improvement Financial Resources/Insurance Housing Intimacy Social Support Talents/Skills  ADL's:  Intact  Cognition: WNL  Sleep:  fair    Treatment Plan Summary: Netasha is a 57 year old Caucasian female who has a history of depression currently in remission, recent likely history of delirium due to multiple etiologies, as well as multiple medical problems including tremors, recent falls, migraine headaches, vitamin B12 deficiency, MCI, gait instability, bilateral knee pain, tender to the clinic today to establish care.  Patient does have an extensive history of mental health problems in the past and had several inpatient admissions in the past but has been stable since the past 10 yrs or so.  Patient was referred to the clinic by her neurology to get a second opinion on her psychiatric medications due to her recent confusion, memory  loss and falls.  Discussed plan with patient as well as husband.  Plan as noted below. Medication management and Plan as noted below  Plan  MDD, likely in remission, Reduce doxepin to 25 mg p.o. nightly Reduce trazodone to 100 mg p.o. nightly Wean off lorazepam 0.5 mg, she reports she take it twice a day now.  Gave her written instructions to take it once a day for the next 2 weeks.  She will come back and see me in 2 weeks.  Discussed the risk of being on benzodiazepine including cognitive problems, falls, confusion, withdrawal symptoms.   For history of delirium, resolved Likely due to UTI, dehydration as well as polypharmacy. We will continue to monitor.  For history of tremors Patient reports she has had tremors since a very long time.  Her medications like Depakote can also cause tremors.  However she takes Depakote for migraine headaches.  Discussed with her to talk to neurology about changing her medication to something else if she continues to have problem with tremors.   For MCI Her most recent MMSE- 26/30 -per neurology. She seem to be alert, oriented and was able to give personal information today.  She reports she does not have any memory about what happened in January and February . She is currently on vitamin B12 replacement since it was low which can also contribute to her memory problems. We will taper off some of her medications including Ativan, doxepin which can also cause cognitive issues. Patient will follow-up with Probation officer  in 2 weeks.  Discussed medication changes with patient as well as husband and provided written instructions.  Will obtain medical records from Dr. Kasandra Knudsen.  More than 50 % of the time was spent for psychoeducation and supportive psychotherapy and care coordination.  This note was generated in part or whole with voice recognition software. Voice recognition is usually quite accurate but there are transcription errors that can and very often do occur.  I apologize for any typographical errors that were not detected and corrected.     Ursula Alert, MD 3/7/201912:35 PM

## 2017-12-22 NOTE — Patient Instructions (Addendum)
Please start taking Lorazepam ( 0.5 mg) - one tablet instead of two starting today for the next two week.  Please start taking Doxepin 25 mg at bedtime - new script sent to pharmacy.  Please start taking Trazodone 100 mg po at bedtime - new script sent to pharmacy.  All other medications remain the same.  Start drinking lots of fluids, stay well hydrated.

## 2017-12-23 ENCOUNTER — Encounter: Payer: Self-pay | Admitting: Psychiatry

## 2017-12-27 ENCOUNTER — Ambulatory Visit: Payer: Medicare Other | Admitting: Urology

## 2017-12-27 ENCOUNTER — Encounter: Payer: Self-pay | Admitting: Urology

## 2017-12-27 VITALS — BP 90/65 | HR 64 | Resp 16 | Ht 62.0 in | Wt 193.0 lb

## 2017-12-27 DIAGNOSIS — N3946 Mixed incontinence: Secondary | ICD-10-CM

## 2017-12-27 DIAGNOSIS — R350 Frequency of micturition: Secondary | ICD-10-CM

## 2017-12-27 LAB — BLADDER SCAN AMB NON-IMAGING

## 2017-12-27 NOTE — Progress Notes (Signed)
12/27/2017 10:38 AM   Susan Houston 09/15/1961 [id number]  Referring provider: Valerie Roys, DO [place], [place] [address]  No chief complaint on file.   HPI: Patient is a 57 -year-old Caucasian female who is referred to [place] by, Dr. Park Liter, for urinary incontinence with her husband, Tharon Aquas.    Patient states that she has had urinary incontinence for over 6 years.  Patient has incontinence with stress and urge.   She is experiencing several incontinent episodes during the day and several incontinent episodes during the night.  She states the urine is just dripping out.  She is going through 2 to 3 pads during the day and 2 to 3 pads during the night.    She is having associated urinary frequency x 8-10, strong urgency, nocturia x 3-6 and intermittency.   Her PVR was 217 mL.  She states she did have an UTI several months ago which resulted in severe mental status changes.  Reviewing her records, she had one positive urine culture for Klebsiella.     Patient denies any gross hematuria, dysuria or suprapubic/flank pain.  Patient denies any fevers, chills, nausea or vomiting.   She underwent an anterior colporrhaphy with pubovaginal sling using the TVT Exact device and cystoscopy with Dr. Kenton Kingfisher on 10/03/2013.  She states that she went into urinary retention after the procedure.  She had an indwelling Foley for over one month and then failed a TOV.  She then had an indwelling Foley for another two weeks.  She and her husband refused to learn CIC prior to the surgery.  She states that after she was able to void on her own, but she continued to have incontinence.    She passed a stone spontaneously when she was a young adult.    She is post menopausal.     She admits to constipation and/or diarrhea.   Contrast CT performed in 03/22/2017 noted the adrenal glands are unremarkable. Kidneys are normal, without renal calculi, focal lesion, or hydronephrosis.  Bladder is  unremarkable.    She is drinking 5 to 6 bottles of water daily.   No juices.  She is drinking caffeinated beverages occasionally.  She is not drinking  alcoholic beverages daily.    Patient states that since the surgery on her bladder, she has had no life.  She will not go anywhere for fear of having an incontinence episode away from the house.  She does not go to church, outings, vacations or visit family members.    PMH: Past Medical History:  Diagnosis Date  . Anxiety   . Arthritis   . Asthma   . Back pain   . Bruises easily   . Cancer ([place]) 2009   LARYNX CANCER - CHEMO / RADIATION (NO SURGERY)   . Chronic leg pain    BILATERAL  . COPD (chronic obstructive pulmonary disease) ([place])   . Depression   . Difficulty sleeping   . Diverticulitis   . Hemorrhoids   . History of kidney stones   . Hyperlipidemia   . Hypertension   . Incontinence of urine   . Migraine   . MRSA infection greater than 3 months ago    2008 right side of face  . Nerve damage    right leg    Surgical History: Past Surgical History:  Procedure Laterality Date  . ABDOMINAL HYSTERECTOMY  1983  . BLADDER SUSPENSION  2014  . CHOLECYSTECTOMY    .  COLONOSCOPY    . ESOPHAGOGASTRODUODENOSCOPY    . ESOPHAGOGASTRODUODENOSCOPY (EGD) WITH PROPOFOL N/A 11/20/2016   Procedure: ESOPHAGOGASTRODUODENOSCOPY (EGD) WITH PROPOFOL;  Surgeon: Jonathon Bellows, MD;  Location: [facility] END[facility];  Service: Endoscopy;  Laterality: N/A;  . EXCISION NEUROMA Bilateral 09/24/2014   Procedure: BILATERAL OPEN SAPHENOUS NEURECTOMIES AND OPEN LEFT PERIPATELLA OSETOPHYTECTOMY;  Surgeon: Mauri Pole, MD;  Location: [facility];  Service: Orthopedics;  Laterality: Bilateral;  . JOINT REPLACEMENT  2009/2010   BIL TOTAL KNEES  . TONSILLECTOMY      Home Medications:  Allergies as of 12/27/2017      Reactions   Lexapro [escitalopram Oxalate] Other (See Comments)   Keeps awake for days.    Morphine And Related Itching      Medication List          Accurate as of 12/27/17 11:59 PM. Always use your most recent med list.          aspirin 81 MG tablet Take 81 mg by mouth daily.   busPIRone 15 MG tablet Commonly known as:  BUSPAR Take 15 mg by mouth daily.   doxepin 25 MG capsule Commonly known as:  SINEQUAN Take 1 capsule (25 mg total) by mouth at bedtime.   gabapentin 300 MG capsule Commonly known as:  NEURONTIN Take 300 mg by mouth 3 (three) times daily.   LORazepam 0.5 MG tablet Commonly known as:  ATIVAN Take by mouth. 2 tablets at bedtime Reported on 02/26/2016   magnesium citrate solution Take 148 mLs by mouth daily. If not better on the milk of magnesia, try this   magnesium hydroxide 400 MG/5ML suspension Commonly known as:  MILK OF MAGNESIA Take 30 mLs by mouth daily as needed for mild constipation.   nystatin ointment Commonly known as:  MYCOSTATIN Apply 1 application topically 2 (two) times daily.   omeprazole 40 MG capsule Commonly known as:  PRILOSEC Take 1 capsule (40 mg total) by mouth daily.   ondansetron 4 MG disintegrating tablet Commonly known as:  ZOFRAN-ODT DISSOLVE 1 TABLET(4 MG) ON THE TONGUE EVERY 12 HOURS AS NEEDED FOR NAUSEA   SUMAtriptan 100 MG tablet Commonly known as:  IMITREX TAKE 1 TAB BY MOUTH AT START OF MIGRAINE. MAY REPEAT IN 2 HOURS IF HEADACHE PERSISTS OR RECURS. MAX: 2 TABS IN 24 HRS.   tiZANidine 4 MG tablet Commonly known as:  ZANAFLEX TAKE 1 TABLET BY MOUTH THREE TIMES DAILY AS NEEDED   topiramate 100 MG tablet Commonly known as:  TOPAMAX TAKE 1 TABLET(100 MG) BY MOUTH TWICE DAILY   traZODone 100 MG tablet Commonly known as:  DESYREL Take 1 tablet (100 mg total) by mouth at bedtime.   vitamin B-12 1000 MCG tablet Commonly known as:  CYANOCOBALAMIN Take 1,000 mcg by mouth daily.       Allergies:  Allergies  Allergen Reactions  . Lexapro [Escitalopram Oxalate] Other (See Comments)    Keeps awake for days.   . Morphine And Related Itching    Family  History: Family History  Problem Relation Age of Onset  . Asthma Mother   . Diabetes Mother   . Hyperlipidemia Mother   . Hypertension Mother   . Cirrhosis Mother        Non-alcoholic  . Arthritis Father   . Cancer Father        Bone  . Hyperlipidemia Father   . Hypertension Father   . Anxiety disorder Sister   . Depression Sister   . Kidney Stones Son   .  Stroke Maternal Grandmother   . Cancer Maternal Grandfather        lung  . Pneumonia Paternal Grandmother   . Alzheimer's disease Paternal Grandfather   . Diabetes Sister   . Gout Sister   . Hypertension Sister   . Alcohol abuse Brother   . Heart disease Brother        massive MI    Social History:  reports that she quit smoking about 20 months ago. Her smoking use included cigarettes. She smoked 0.50 packs per day. she has never used smokeless tobacco. She reports that she does not drink alcohol or use drugs.  ROS: UROLOGY Frequent Urination?: Yes Hard to postpone urination?: Yes Burning/pain with urination?: No Get up at night to urinate?: Yes Leakage of urine?: Yes Urine stream starts and stops?: Yes Trouble starting stream?: No Do you have to strain to urinate?: No Blood in urine?: No Urinary tract infection?: Yes Sexually transmitted disease?: No Injury to kidneys or bladder?: No Painful intercourse?: No Weak stream?: No Currently pregnant?: No Vaginal bleeding?: No Last menstrual period?: n  Gastrointestinal Nausea?: Yes Vomiting?: Yes Indigestion/heartburn?: Yes Diarrhea?: Yes Constipation?: Yes  Constitutional Fever: No Night sweats?: No Weight loss?: Yes Fatigue?: No  Skin Skin rash/lesions?: Yes Itching?: No  Eyes Blurred vision?: Yes Double vision?: No  Ears/Nose/Throat Sore throat?: No Sinus problems?: No  Hematologic/Lymphatic Swollen glands?: No Easy bruising?: Yes  Cardiovascular Leg swelling?: Yes Chest pain?: Yes  Respiratory Cough?: Yes Shortness of breath?:  Yes  Endocrine Excessive thirst?: Yes  Musculoskeletal Back pain?: Yes Joint pain?: Yes  Neurological Headaches?: Yes Dizziness?: Yes  Psychologic Depression?: No Anxiety?: Yes  Physical Exam: BP 90/65   Pulse 64   Resp 16   Ht 5\' 2"  (1.575 m)   Wt 193 lb (87.5 kg)   SpO2 98%   BMI 35.30 kg/m   Constitutional: Well nourished. Alert and oriented, No acute distress. HEENT: [place] AT, moist mucus membranes. Trachea midline, no masses. Cardiovascular: No clubbing, cyanosis, or edema. Respiratory: Normal respiratory effort, no increased work of breathing. GI: Abdomen is soft, non tender, non distended, no abdominal masses. Liver and spleen not palpable.  No hernias appreciated.  Stool sample for occult testing is not indicated.   GU: No CVA tenderness.  No bladder fullness or masses.  Normal external genitalia, normal pubic hair distribution, no lesions.  Normal urethral meatus, no lesions, no prolapse, no discharge.   No urethral masses, tenderness and/or tenderness. No bladder fullness, tenderness or masses. Normal vagina mucosa, good estrogen effect, no discharge, no lesions, good pelvic support, no cystocele or rectocele noted.  Cervix and uterus are surgically absent.  No adnexal/parametria masses or tenderness noted.  Anus and perineum are without rashes or lesions.    Skin: No rashes, bruises or suspicious lesions. Lymph: No cervical or inguinal adenopathy. Neurologic: Grossly intact, no focal deficits, moving all 4 extremities. Psychiatric: Normal mood and affect.  Laboratory Data: Lab Results  Component Value Date   WBC 4.1 12/06/2017   HGB 13.5 12/06/2017   HCT 39.8 12/06/2017   MCV 89.8 12/06/2017   PLT 231 12/06/2017    Lab Results  Component Value Date   CREATININE 0.81 12/06/2017    No results found for: PSA  No results found for: TESTOSTERONE  No results found for: HGBA1C  Lab Results  Component Value Date   TSH 3.16 05/21/2014       Component  Value Date/Time   CHOL 169 12/03/2017 1447  HDL 45 12/03/2017 1447   LDLCALC 99 12/03/2017 1447    Lab Results  Component Value Date   AST 30 12/06/2017   Lab Results  Component Value Date   ALT 6 (L) 12/06/2017   No components found for: ALKALINEPHOPHATASE No components found for: BILIRUBINTOTAL  No results found for: ESTRADIOL  Urinalysis    Component Value Date/Time   COLORURINE YELLOW (A) 12/06/2017 1704   APPEARANCEUR Hazy (A) 12/13/2017 1547   LABSPEC 1.012 12/06/2017 1704   LABSPEC 1.012 10/19/2013 1810   PHURINE 5.0 12/06/2017 1704   GLUCOSEU Negative 12/13/2017 1547   GLUCOSEU Negative 10/19/2013 1810   HGBUR NEGATIVE 12/06/2017 1704   BILIRUBINUR Negative 12/13/2017 1547   BILIRUBINUR Negative 10/19/2013 1810   KETONESUR NEGATIVE 12/06/2017 1704   PROTEINUR Negative 12/13/2017 1547   PROTEINUR NEGATIVE 12/06/2017 1704   UROBILINOGEN 1.0 09/07/2014 1157   NITRITE Negative 12/13/2017 1547   NITRITE NEGATIVE 12/06/2017 1704   LEUKOCYTESUR Negative 12/13/2017 1547   LEUKOCYTESUR 1+ 10/19/2013 1810    I have reviewed the labs.   Pertinent Imaging: CLINICAL DATA:  Abdomen pain with nausea and diarrhea  EXAM: CT ABDOMEN AND PELVIS WITH CONTRAST  TECHNIQUE: Multidetector CT imaging of the abdomen and pelvis was performed using the standard protocol following bolus administration of intravenous contrast.  CONTRAST:  177mL ISOVUE-300 IOPAMIDOL (ISOVUE-300) INJECTION 61%  COMPARISON:  05/18/2014  FINDINGS: Lower chest: Lung bases demonstrate no acute consolidation or pleural effusion. Normal heart size.  Hepatobiliary: Hepatic steatosis. Surgical clips in the gallbladder fossa. No biliary dilatation  Pancreas: Unremarkable. No pancreatic ductal dilatation or surrounding inflammatory changes.  Spleen: Normal in size without focal abnormality.  Adrenals/Urinary Tract: Adrenal glands are unremarkable. Kidneys are normal, without renal  calculi, focal lesion, or hydronephrosis. Bladder is unremarkable.  Stomach/Bowel: Stomach is nonenlarged. No dilated small bowel. Questionable mild wall thickening of the hepatic and splenic flexures. Appendix not well identified, no right lower quadrant inflammation.  Vascular/Lymphatic: Aortic atherosclerosis. No aneurysm is seen. No significantly enlarged lymph nodes.  Reproductive: Status post hysterectomy. No adnexal masses.  Other: No free air or free fluid. Fat containing periumbilical hernias.  Musculoskeletal: No acute or significant osseous findings.  IMPRESSION: 1. Questionable wall thickening/mild colitis versus under distension of the colon at the hepatic and splenic flexures. 2. Otherwise no acute intra-abdominal or pelvic pathology is seen 3. Hepatic steatosis 4. Fatty periumbilical hernias.   Electronically Signed   By: Donavan Foil M.D.   On: 03/22/2017 20:59  Results for SHANIYA, TASHIRO (MRN [id number]) as of 12/31/2017 10:32  Ref. Range 12/27/2017 16:04  Scan Result Unknown 226ml   I have independently reviewed the films.    Assessment & Plan:    1. Mixed Incontinence  - offered behavioral therapies, bladder training, bladder control strategies and pelvic floor muscle training - worried about the cost of PT and transportation  - fluid management - good fluid intake   - will schedule an appointment with Dr. Matilde Sprang as she has had previous bladder surgery and may need an extensive workup to determine her incontinent issues  2. Frequent urination - BLADDER SCAN AMB NON-IMAGING   Return for appointment with Dr. Matilde Sprang  .  These notes generated with voice recognition software. I apologize for typographical errors.  Zara Council, [facility] [address] [place], [place] [address] [phone]

## 2017-12-28 ENCOUNTER — Telehealth: Payer: Self-pay

## 2017-12-28 DIAGNOSIS — H2513 Age-related nuclear cataract, bilateral: Secondary | ICD-10-CM | POA: Diagnosis not present

## 2017-12-28 DIAGNOSIS — D3122 Benign neoplasm of left retina: Secondary | ICD-10-CM | POA: Diagnosis not present

## 2017-12-28 NOTE — Telephone Encounter (Signed)
Copied from [place] [id number]. Topic: Referral - Request >> Dec 28, 2017 10:27 AM Synthia Innocent wrote: Reason for CRM: Requesting order for Diagnostic Mammogram, having pain when pushed on right breast. Requesting order be sent to [place] to provider for order.

## 2017-12-28 NOTE — Telephone Encounter (Signed)
Called and left patient a VM asking for her to please return my call. OK for PEC to speak to patient if she calls back. Please find out exactly where the patient is experiencing pain on her breast.

## 2017-12-28 NOTE — Telephone Encounter (Signed)
Can we find out what O'Clock the pain is?

## 2017-12-29 ENCOUNTER — Ambulatory Visit: Payer: Self-pay | Admitting: *Deleted

## 2017-12-29 NOTE — Telephone Encounter (Signed)
Patient called about breast pain, she says she already spoke to nurse Churdan about it. See triage encounter.

## 2017-12-29 NOTE — Telephone Encounter (Signed)
Left message to call back regarding breat pain

## 2017-12-29 NOTE — Telephone Encounter (Signed)
Called in c/o right breast pain only when she presses on it hard.   She has a history of breast cancer in her family so she does a self breast exam every night in the shower.   It has been tender for about 6 months only when she presses hard on the outer under arm part of the breast.   See triage notes.  I made an appt with Megan Johson for 01/03/18 at 10:30am.    She is calling her mother in law to see if she can bring Ms. Moder in.   If not then she is going to call us back and reschedule.   Reason for Disposition . [1] Breast pain AND [2] cause is not known  Answer Assessment - Initial Assessment Questions 1. SYMPTOM: "What's the main symptom you're concerned about?"  (e.g., lump, pain, rash, nipple discharge)     My right breast is tender when I press on it for about 6 months.   I do my own breast exam every night in the shower.  I don't feel any knots. 2. LOCATION: "Where is the _______ located?"     Located under the armpit into the breast on the right side.   I have to press hard to make it hurt. 3. ONSET: "When did ________  start?"     6 month ago 4. PRIOR HISTORY: "Do you have any history of prior problems with your breasts?" (e.g., lumps, cancer, fibrocystic breast disease)     Breast cancer runs in my family.   No cancer myself 5. CAUSE: "What do you think is causing this symptom?"     I have a history of falls.  I'm very unstable without using a cane or walker.  I fell 7-8 months ago fell backwards off the steps outside.   I fell on concrete and hit my head.   My arm, back of neck and right front part of my chest upper has been sore since the fall.   I've not told any doctors about this.   There was no bleeding when I fell.   I was just shook up.   I don't know if this is from that fall or not. 6. OTHER SYMPTOMS: "Do you have any other symptoms?" (e.g., fever, breast pain, redness or rash, nipple discharge)     No fever, rash , redness or discharge 7. PREGNANCY-BREASTFEEDING: "Is  there any chance you are pregnant?" "When was your last menstrual period?" "Are you breastfeeding?"     No!  Protocols used: BREAST [facility]

## 2018-01-03 ENCOUNTER — Ambulatory Visit: Payer: Medicare Other | Admitting: Urology

## 2018-01-03 ENCOUNTER — Encounter: Payer: Self-pay | Admitting: Urology

## 2018-01-03 ENCOUNTER — Ambulatory Visit: Payer: Medicare Other | Admitting: Family Medicine

## 2018-01-03 ENCOUNTER — Encounter: Payer: Self-pay | Admitting: Family Medicine

## 2018-01-03 VITALS — BP 157/86 | HR 84 | Ht 62.0 in | Wt 193.0 lb

## 2018-01-03 VITALS — BP 118/78 | HR 77 | Wt 193.0 lb

## 2018-01-03 DIAGNOSIS — N644 Mastodynia: Secondary | ICD-10-CM | POA: Diagnosis not present

## 2018-01-03 DIAGNOSIS — M79621 Pain in right upper arm: Secondary | ICD-10-CM | POA: Diagnosis not present

## 2018-01-03 DIAGNOSIS — R32 Unspecified urinary incontinence: Secondary | ICD-10-CM

## 2018-01-03 DIAGNOSIS — Z1231 Encounter for screening mammogram for malignant neoplasm of breast: Secondary | ICD-10-CM | POA: Diagnosis not present

## 2018-01-03 DIAGNOSIS — Z1239 Encounter for other screening for malignant neoplasm of breast: Secondary | ICD-10-CM

## 2018-01-03 LAB — URINALYSIS, COMPLETE
BILIRUBIN UA: NEGATIVE
GLUCOSE, UA: NEGATIVE
Ketones, UA: NEGATIVE
Nitrite, UA: NEGATIVE
PROTEIN UA: NEGATIVE
Specific Gravity, UA: <1.005 — ABNORMAL LOW (ref 1.005–1.030)
UUROB: 0.2 mg/dL (ref 0.2–1.0)
pH, UA: 5.5 (ref 5.0–7.5)

## 2018-01-03 LAB — MICROSCOPIC EXAMINATION

## 2018-01-03 LAB — BLADDER SCAN AMB NON-IMAGING

## 2018-01-03 NOTE — Progress Notes (Signed)
BP 118/78   Pulse 77   Wt 193 lb (87.5 kg)   SpO2 97%   BMI 35.30 kg/m    Subjective:    Patient ID: Susan Houston, female    DOB: 1961/09/25, 57 y.o.   MRN: [id number]  HPI: Susan Houston is a 57 y.o. female  Chief Complaint  Patient presents with  . Breast Pain    On R. Side. Under arm. Pain only present if "Pressed on really hard"   BREAST PAIN Duration:  8 months Location: right axilla Onset: sudden- after she fell backwards down the staids Severity: mild- only with mashing really hard Quality: aching Frequency: only with pushing on it Redness: no Swelling: no Trauma: no trauma Breastfeeding: no Associated with menstral cycle: no Nipple discharge: no Breast lump: no Status: stable Treatments attempted: none Previous mammogram: yes, 2015  Relevant past medical, surgical, family and social history reviewed and updated as indicated. Interim medical history since our last visit reviewed. Allergies and medications reviewed and updated.  Review of Systems  Constitutional: Negative.   Respiratory: Negative.   Cardiovascular: Negative.   Musculoskeletal: Positive for arthralgias and myalgias. Negative for back pain, gait problem, joint swelling, neck pain and neck stiffness.  Skin: Negative.   Psychiatric/Behavioral: Negative.     Per HPI unless specifically indicated above     Objective:    BP 118/78   Pulse 77   Wt 193 lb (87.5 kg)   SpO2 97%   BMI 35.30 kg/m   Wt Readings from Last 3 Encounters:  01/03/18 193 lb (87.5 kg)  12/27/17 193 lb (87.5 kg)  12/13/17 196 lb 9 oz (89.2 kg)    Physical Exam  Constitutional: She is oriented to person, place, and time. She appears well-developed and well-nourished. No distress.  HENT:  Head: Normocephalic and atraumatic.  Right Ear: Hearing normal.  Left Ear: Hearing normal.  Nose: Nose normal.  Eyes: Conjunctivae and lids are normal. Right eye exhibits no discharge. Left eye exhibits no discharge. No scleral  icterus.  Cardiovascular: Normal rate, regular rhythm, normal heart sounds and intact distal pulses. Exam reveals no gallop and no friction rub.  No murmur heard. Pulmonary/Chest: Effort normal and breath sounds normal. No respiratory distress. She has no wheezes. She has no rales. She exhibits no tenderness. Right breast exhibits tenderness (7-8 o'clock). Right breast exhibits no inverted nipple, no mass, no nipple discharge and no skin change. Left breast exhibits no inverted nipple, no mass, no nipple discharge, no skin change and no tenderness. Breasts are symmetrical.  Musculoskeletal: Normal range of motion. She exhibits tenderness (Ribs 7-8 in R axilla).  Neurological: She is alert and oriented to person, place, and time.  Skin: Skin is warm, dry and intact. No rash noted. She is not diaphoretic. No erythema. No pallor.  Psychiatric: She has a normal mood and affect. Her speech is normal and behavior is normal. Judgment and thought content normal. Cognition and memory are normal.  Nursing note and vitals reviewed.   Results for orders placed or performed in visit on 12/27/17  BLADDER SCAN AMB NON-IMAGING  Result Value Ref Range   Scan Result 22ml       Assessment & Plan:   Problem List Items Addressed This Visit    None    Visit Diagnoses    Pain in right axilla    -  Primary   Will obtain diagnostic mammogram with [place] if needed, given pain along rib, will also check  rib films. Await results.    Screening for breast cancer       Mammogram ordered today.       Follow up plan: Return As scheduled.

## 2018-01-03 NOTE — Progress Notes (Signed)
01/03/2018 3:12 PM   Susan Houston 02-04-1961 [id number]  Referring provider: Valerie Roys, DO [place], [place] [address]  Chief Complaint  Patient presents with  . Urinary Incontinence    HPI: Susan Houston: Patient was assessed for mixed incontinence.  She was getting up 3 or 6 times a night and a residual was 217 mL.  She had one positive urinary tract infection.  She has had an anterior repair and pubovaginal sling using a TVT.  She describes retention afterwards needing a catheter for a month.  She did clean intermittent catheterization.  Today The patient leaks with coughing and sneezing and laughing as well as with urgency and both are significant.  She does have bedwetting but is not every night and she reports moderate severity.  She does sometimes have foot on the floor syndrome.  She says she can wear 15 pads a day moderately wet.  She voids every hour or sooner and cannot hold urination for 2 hours.  She gets up 6 or 7 times a night and sometimes has ankle edema.  Sometimes her flow is reasonable and other times it stops and starts and she does not feel empty.  She no longer catheterizes  She has no neurologic issues.  She utilizes a cane for her knee.  She has had a hysterectomy.  Bowel movements normal.  She is tried medications in the past  Modifying factors: There are no other modifying factors  Associated signs and symptoms: There are no other associated signs and symptoms Aggravating and relieving factors: There are no other aggravating or relieving factors Severity: Moderate Duration: Persistent   PMH: Past Medical History:  Diagnosis Date  . Anxiety   . Arthritis   . Asthma   . Back pain   . Bruises easily   . Cancer ([place]) 2009   LARYNX CANCER - CHEMO / RADIATION (NO SURGERY)   . Chronic leg pain    BILATERAL  . COPD (chronic obstructive pulmonary disease) ([place])   . Depression   . Difficulty sleeping   . Diverticulitis   . Hemorrhoids   .  History of kidney stones   . Hyperlipidemia   . Hypertension   . Incontinence of urine   . Migraine   . MRSA infection greater than 3 months ago    2008 right side of face  . Nerve damage    right leg    Surgical History: Past Surgical History:  Procedure Laterality Date  . ABDOMINAL HYSTERECTOMY  1983  . BLADDER SUSPENSION  2014  . CHOLECYSTECTOMY    . COLONOSCOPY    . ESOPHAGOGASTRODUODENOSCOPY    . ESOPHAGOGASTRODUODENOSCOPY (EGD) WITH PROPOFOL N/A 11/20/2016   Procedure: ESOPHAGOGASTRODUODENOSCOPY (EGD) WITH PROPOFOL;  Surgeon: Jonathon Bellows, MD;  Location: [facility] ENDOSCOPY;  Service: Endoscopy;  Laterality: N/A;  . EXCISION NEUROMA Bilateral 09/24/2014   Procedure: BILATERAL OPEN SAPHENOUS NEURECTOMIES AND OPEN LEFT PERIPATELLA OSETOPHYTECTOMY;  Surgeon: Mauri Pole, MD;  Location: [facility];  Service: Orthopedics;  Laterality: Bilateral;  . JOINT REPLACEMENT  2009/2010   BIL TOTAL KNEES  . TONSILLECTOMY      Home Medications:  Allergies as of 01/03/2018      Reactions   Lexapro [escitalopram Oxalate] Other (See Comments)   Keeps awake for days.    Morphine And Related Itching      Medication List        Accurate as of 01/03/18  3:12 PM. Always use your most recent med list.  aspirin 81 MG tablet Take 81 mg by mouth daily.   busPIRone 15 MG tablet Commonly known as:  BUSPAR Take 15 mg by mouth daily.   doxepin 25 MG capsule Commonly known as:  SINEQUAN Take 1 capsule (25 mg total) by mouth at bedtime.   gabapentin 300 MG capsule Commonly known as:  NEURONTIN Take 300 mg by mouth 3 (three) times daily.   LORazepam 0.5 MG tablet Commonly known as:  ATIVAN Take by mouth. 2 tablets at bedtime Reported on 02/26/2016   magnesium citrate solution Take 148 mLs by mouth daily. If not better on the milk of magnesia, try this   magnesium hydroxide 400 MG/5ML suspension Commonly known as:  MILK OF MAGNESIA Take 30 mLs by mouth daily as needed for mild  constipation.   nystatin ointment Commonly known as:  MYCOSTATIN Apply 1 application topically 2 (two) times daily.   omeprazole 40 MG capsule Commonly known as:  PRILOSEC Take 1 capsule (40 mg total) by mouth daily.   ondansetron 4 MG disintegrating tablet Commonly known as:  ZOFRAN-ODT DISSOLVE 1 TABLET(4 MG) ON THE TONGUE EVERY 12 HOURS AS NEEDED FOR NAUSEA   SUMAtriptan 100 MG tablet Commonly known as:  IMITREX TAKE 1 TAB BY MOUTH AT START OF MIGRAINE. MAY REPEAT IN 2 HOURS IF HEADACHE PERSISTS OR RECURS. MAX: 2 TABS IN 24 HRS.   tiZANidine 4 MG tablet Commonly known as:  ZANAFLEX TAKE 1 TABLET BY MOUTH THREE TIMES DAILY AS NEEDED   topiramate 100 MG tablet Commonly known as:  TOPAMAX TAKE 1 TABLET(100 MG) BY MOUTH TWICE DAILY   traZODone 100 MG tablet Commonly known as:  DESYREL Take 1 tablet (100 mg total) by mouth at bedtime.   vitamin B-12 1000 MCG tablet Commonly known as:  CYANOCOBALAMIN Take 1,000 mcg by mouth daily.       Allergies:  Allergies  Allergen Reactions  . Lexapro [Escitalopram Oxalate] Other (See Comments)    Keeps awake for days.   . Morphine And Related Itching    Family History: Family History  Problem Relation Age of Onset  . Asthma Mother   . Diabetes Mother   . Hyperlipidemia Mother   . Hypertension Mother   . Cirrhosis Mother        Non-alcoholic  . Arthritis Father   . Cancer Father        Bone  . Hyperlipidemia Father   . Hypertension Father   . Anxiety disorder Sister   . Depression Sister   . Kidney Stones Son   . Stroke Maternal Grandmother   . Cancer Maternal Grandfather        lung  . Pneumonia Paternal Grandmother   . Alzheimer's disease Paternal Grandfather   . Diabetes Sister   . Gout Sister   . Hypertension Sister   . Alcohol abuse Brother   . Heart disease Brother        massive MI    Social History:  reports that she quit smoking about 20 months ago. Her smoking use included cigarettes. She smoked  0.50 packs per day. she has never used smokeless tobacco. She reports that she does not drink alcohol or use drugs.  ROS: UROLOGY Frequent Urination?: Yes Hard to postpone urination?: Yes Burning/pain with urination?: No Get up at night to urinate?: Yes Leakage of urine?: Yes Urine stream starts and stops?: Yes Trouble starting stream?: No Do you have to strain to urinate?: No Urinary tract infection?: No Sexually transmitted disease?: No  Injury to kidneys or bladder?: No Painful intercourse?: No Weak stream?: No Currently pregnant?: No Vaginal bleeding?: No Last menstrual period?: n  Gastrointestinal Nausea?: No Vomiting?: No Indigestion/heartburn?: No Diarrhea?: No Constipation?: No  Constitutional Fever: No Night sweats?: No Weight loss?: No Fatigue?: No  Skin Skin rash/lesions?: No Itching?: No  Eyes Blurred vision?: Yes Double vision?: No  Ears/Nose/Throat Sore throat?: No Sinus problems?: No  Hematologic/Lymphatic Swollen glands?: No Easy bruising?: No  Cardiovascular Leg swelling?: No Chest pain?: No  Respiratory Cough?: No Shortness of breath?: No  Endocrine Excessive thirst?: Yes  Musculoskeletal Back pain?: No Joint pain?: No  Neurological Headaches?: No Dizziness?: No  Psychologic Depression?: No Anxiety?: No  Physical Exam: BP (!) 157/86   Pulse 84   Ht 5\' 2"  (1.575 m)   Wt 193 lb (87.5 kg)   BMI 35.30 kg/m   Constitutional:  Alert and oriented, No acute distress. HEENT: [place] AT, moist mucus membranes.  Trachea midline, no masses. Cardiovascular: No clubbing, cyanosis, or edema. Respiratory: Normal respiratory effort, no increased work of breathing. GI: Abdomen is soft, nontender, nondistended, no abdominal masses GU: Pelvic examination the patient had grade 1 hypermobility of the bladder neck and a negative cough test.  The sling is likely at the proximal third of the urethra.  She had a grade 1 cystocele but really no  hinge effect.  I could feel at the right urethrovesical angle the sling but in my opinion there was no extrusion. Skin: No rashes, bruises or suspicious lesions. Lymph: No cervical or inguinal adenopathy. Neurologic: Grossly intact, no focal deficits, moving all 4 extremities. Psychiatric: Normal mood and affect.  Laboratory Data: Lab Results  Component Value Date   WBC 4.1 12/06/2017   HGB 13.5 12/06/2017   HCT 39.8 12/06/2017   MCV 89.8 12/06/2017   PLT 231 12/06/2017    Lab Results  Component Value Date   CREATININE 0.81 12/06/2017    No results found for: PSA  No results found for: TESTOSTERONE  No results found for: HGBA1C  Urinalysis    Component Value Date/Time   COLORURINE YELLOW (A) 12/06/2017 1704   APPEARANCEUR Hazy (A) 12/13/2017 1547   LABSPEC 1.012 12/06/2017 1704   LABSPEC 1.012 10/19/2013 1810   PHURINE 5.0 12/06/2017 1704   GLUCOSEU Negative 12/13/2017 1547   GLUCOSEU Negative 10/19/2013 1810   HGBUR NEGATIVE 12/06/2017 1704   BILIRUBINUR Negative 12/13/2017 1547   BILIRUBINUR Negative 10/19/2013 [place] 12/06/2017 1704   PROTEINUR Negative 12/13/2017 1547   PROTEINUR NEGATIVE 12/06/2017 1704   UROBILINOGEN 1.0 09/07/2014 1157   NITRITE Negative 12/13/2017 1547   NITRITE NEGATIVE 12/06/2017 1704   LEUKOCYTESUR Negative 12/13/2017 1547   LEUKOCYTESUR 1+ 10/19/2013 1810    Pertinent Imaging: none  Assessment & Plan: The patient has mixed incontinence and bedwetting.  She had retention issues post previous sling.  She has very significant frequency and nocturia.  She was scanned again for 307 mL.  The patient has elevated residual urine volumes by bladder scan and history.  I recommended urodynamics and future cystoscopy and we will proceed accordingly.  It will be interesting to see the severity of her stress incontinence during urodynamic  1. Urinary incontinence, unspecified type 2.  Mixed urinary incontinence   -  Urinalysis, Complete   No Follow-up on file.  Reece Packer, MD  [facility] [address] [place], [place] [address] [phone]

## 2018-01-05 ENCOUNTER — Telehealth: Payer: Self-pay | Admitting: Family Medicine

## 2018-01-05 MED ORDER — VARENICLINE TARTRATE 1 MG PO TABS: 1.0000 mg | ORAL_TABLET | Freq: Two times a day (BID) | ORAL | 2 refills | Status: DC

## 2018-01-05 NOTE — Telephone Encounter (Signed)
Patient notified that she can call and schedule her mammogram, the order has been placed.   Patient would like to know if you can send in the continuing month of Chantix to [facility] in [place], Dr.Sue was previously giving this to her.

## 2018-01-05 NOTE — Telephone Encounter (Signed)
Copied from [place] [id number]. Topic: Inquiry >> Jan 05, 2018 11:26 AM Corie Chiquito, NT wrote: Reason for [id number] is calling to check the status of her mammogram referral. If someone could give her a call back about this at [phone]

## 2018-01-06 ENCOUNTER — Other Ambulatory Visit: Payer: Self-pay | Admitting: Psychiatry

## 2018-01-06 ENCOUNTER — Ambulatory Visit: Payer: Medicare Other | Admitting: Psychiatry

## 2018-01-06 ENCOUNTER — Encounter: Payer: Self-pay | Admitting: Psychiatry

## 2018-01-06 VITALS — BP 156/95 | HR 106 | Temp 97.8°F | Wt 196.2 lb

## 2018-01-06 DIAGNOSIS — F330 Major depressive disorder, recurrent, mild: Secondary | ICD-10-CM

## 2018-01-06 DIAGNOSIS — F05 Delirium due to known physiological condition: Secondary | ICD-10-CM | POA: Diagnosis not present

## 2018-01-06 DIAGNOSIS — F17200 Nicotine dependence, unspecified, uncomplicated: Secondary | ICD-10-CM

## 2018-01-06 DIAGNOSIS — G3184 Mild cognitive impairment, so stated: Secondary | ICD-10-CM

## 2018-01-06 DIAGNOSIS — F1320 Sedative, hypnotic or anxiolytic dependence, uncomplicated: Secondary | ICD-10-CM

## 2018-01-06 LAB — CULTURE, URINE COMPREHENSIVE

## 2018-01-06 MED ORDER — TRAZODONE HCL 100 MG PO TABS: 150.0000 mg | ORAL_TABLET | Freq: Every day | ORAL | 1 refills | Status: DC

## 2018-01-06 MED ORDER — LORAZEPAM 0.5 MG PO TABS: 0.2500 mg | ORAL_TABLET | Freq: Every day | ORAL | 0 refills | Status: DC

## 2018-01-06 NOTE — Progress Notes (Signed)
[facility] MD OP Progress Note  01/06/2018 3:31 PM KAELEEN ODOM  MRN:  [id number]  Chief Complaint: ' I am not doing well." Chief Complaint    Follow-up; Medication Refill     HPI: Susan Houston is a 57 y old Caucasian female, who is married, unemployed, lives in [place], has a history of depression, vitamin B12 deficiency, mild cognitive impairment, migraine, COPD, chronic pain, degenerative disc disease, tremors, hyperlipidemia, presented to the clinic today for a follow-up visit.    Ta today presented alone for the appointment.  She reports she followed the directions and started taking a lower dose of doxepin as well as Ativan.  She reports she has not been doing so well.  She reports she has been feeling hot and cold  all the time.  She also reports she has not been able to sleep since the past few days.  She reports her sleep is broken.  She reports she wakes up every 2 hours or so.  Discussed with her that she may be withdrawing from the benzodiazepines.  Discussed with her that if she is not tolerating the reduced dose well then her dose can be readjusted today.  She otherwise appeared alert and oriented.  She denies any perceptual disturbances.  She denies any suicidality.  She continues to have good social support from her husband. Visit Diagnosis:    ICD-10-CM   1. MDD (major depressive disorder), recurrent episode, mild (HCC) F33.0 LORazepam (ATIVAN) 0.5 MG tablet    DISCONTINUED: traZODone (DESYREL) 100 MG tablet  2. Delirium due to multiple etiologies F05   3. MCI (mild cognitive impairment) G31.84   4. Benzodiazepine dependence ([place]) F13.20   5. Tobacco use disorder F17.200     Past Psychiatric History: Has a history of depression dating back to 13 years ago.  Patient reports she had some marriage stressors at that time and she had multiple inpatient admissions at [facility], [place] , [place]. Patient denies any suicidality.  Past trials of Lexapro.  Past Medical History:  Past  Medical History:  Diagnosis Date  . Anxiety   . Arthritis   . Asthma   . Back pain   . Bruises easily   . Cancer ([place]) 2009   LARYNX CANCER - CHEMO / RADIATION (NO SURGERY)   . Chronic leg pain    BILATERAL  . COPD (chronic obstructive pulmonary disease) ([place])   . Depression   . Difficulty sleeping   . Diverticulitis   . Hemorrhoids   . History of kidney stones   . Hyperlipidemia   . Hypertension   . Incontinence of urine   . Migraine   . MRSA infection greater than 3 months ago    2008 right side of face  . Nerve damage    right leg    Past Surgical History:  Procedure Laterality Date  . ABDOMINAL HYSTERECTOMY  1983  . BLADDER SUSPENSION  2014  . CHOLECYSTECTOMY    . COLONOSCOPY    . ESOPHAGOGASTRODUODENOSCOPY    . ESOPHAGOGASTRODUODENOSCOPY (EGD) WITH PROPOFOL N/A 11/20/2016   Procedure: ESOPHAGOGASTRODUODENOSCOPY (EGD) WITH PROPOFOL;  Surgeon: Jonathon Bellows, MD;  Location: [facility] ENDOSCOPY;  Service: Endoscopy;  Laterality: N/A;  . EXCISION NEUROMA Bilateral 09/24/2014   Procedure: BILATERAL OPEN SAPHENOUS NEURECTOMIES AND OPEN LEFT PERIPATELLA OSETOPHYTECTOMY;  Surgeon: Mauri Pole, MD;  [facility]: [facility];  Service: Orthopedics;  Laterality: Bilateral;  . JOINT REPLACEMENT  2009/2010   BIL TOTAL KNEES  . TONSILLECTOMY      Family  Psychiatric History:Sister -Mental health problems, 3 brothers-alcoholism (deceased)  Family History:  Family History  Problem Relation Age of Onset  . Asthma Mother   . Diabetes Mother   . Hyperlipidemia Mother   . Hypertension Mother   . Cirrhosis Mother        Non-alcoholic  . Arthritis Father   . Cancer Father        Bone  . Hyperlipidemia Father   . Hypertension Father   . Anxiety disorder Sister   . Depression Sister   . Kidney Stones Son   . Stroke Maternal Grandmother   . Cancer Maternal Grandfather        lung  . Pneumonia Paternal Grandmother   . Alzheimer's disease Paternal Grandfather   . Diabetes Sister   . Gout  Sister   . Hypertension Sister   . Alcohol abuse Brother   . Heart disease Brother        massive MI   Substance abuse history: Denies She takes benzodiazepine-prescribed.  She has been on it since the past 13 years.  Social History: Patient reports she was mainly raised by her mother and stepdad.  Her biological father left when she was 78 years old.  She was later on adopted by her stepdad.  She went up to 12th grade.  She went to [employer] and served for 4 years.  She had an honorable discharge.  She has been married to her husband since the past 34 years.  She has 3 children all together, one from her current husband and 2 from her ex-husband.  All her children are adults.  She has 1 grandchild-[name] who is 72 years old.  She helps take care of her granddaughter every day and helps her to get ready for school and also takes care of her when she comes back most days. Social History   Socioeconomic History  . Marital status: Married    Spouse name: Tharon Aquas  . Number of children: 2  . Years of education: 25  . Highest education level: 12th grade  Occupational History  . Occupation: Disabled  Social Needs  . Financial resource strain: Not hard at all  . Food insecurity:    Worry: Never true    Inability: Never true  . Transportation needs:    Medical: No    Non-medical: No  Tobacco Use  . Smoking status: Former Smoker    Packs/day: 0.50    Types: Cigarettes    Last attempt to quit: 04/07/2016    Years since quitting: 1.7  . Smokeless tobacco: Never Used  . Tobacco comment: patient has not smoked x 10 days.06/28/17 chantix  Substance and Sexual Activity  . Alcohol use: No    Alcohol/week: 0.0 oz  . Drug use: No  . Sexual activity: Never  Lifestyle  . Physical activity:    Days per week: 0 days    Minutes per session: 0 min  . Stress: Not at all  Relationships  . Social connections:    Talks on phone: Never    Gets together: Twice a week    Attends religious service:  More than 4 times per year    Active member of club or organization: No    Attends meetings of clubs or organizations: Never    Relationship status: Married  Other Topics Concern  . Not on file  Social History Narrative   Lives at home w/ her husband   Right-handed   Caffeine: occasionally  Allergies:  Allergies  Allergen Reactions  . Lexapro [Escitalopram Oxalate] Other (See Comments)    Keeps awake for days.   . Morphine And Related Itching    Metabolic Disorder Labs: No results found for: HGBA1C, MPG No results found for: PROLACTIN Lab Results  Component Value Date   CHOL 169 12/03/2017   TRIG 126 12/03/2017   HDL 45 12/03/2017   LDLCALC 99 12/03/2017   LDLCALC 87 07/10/2016   Lab Results  Component Value Date   TSH 3.16 05/21/2014   TSH 1.12 05/14/2014    Therapeutic Level Labs: No results found for: LITHIUM No results found for: VALPROATE No components found for:  CBMZ  Current Medications: Current Outpatient Medications  Medication Sig Dispense Refill  . aspirin 81 MG tablet Take 81 mg by mouth daily.    . busPIRone (BUSPAR) 15 MG tablet Take 15 mg by mouth daily.    Marland Kitchen doxepin (SINEQUAN) 25 MG capsule Take 1 capsule (25 mg total) by mouth at bedtime. 30 capsule 1  . gabapentin (NEURONTIN) 300 MG capsule Take 300 mg by mouth 3 (three) times daily.    . magnesium citrate solution Take 148 mLs by mouth daily. If not better on the milk of magnesia, try this 300 mL 6  . magnesium hydroxide (MILK OF MAGNESIA) 400 MG/5ML suspension Take 30 mLs by mouth daily as needed for mild constipation. 360 mL 6  . nystatin ointment (MYCOSTATIN) Apply 1 application topically 2 (two) times daily. 30 g 2  . ondansetron (ZOFRAN-ODT) 4 MG disintegrating tablet DISSOLVE 1 TABLET(4 MG) ON THE TONGUE EVERY 12 HOURS AS NEEDED FOR NAUSEA    . SUMAtriptan (IMITREX) 100 MG tablet TAKE 1 TAB BY MOUTH AT START OF MIGRAINE. MAY REPEAT IN 2 HOURS IF HEADACHE PERSISTS OR RECURS. MAX: 2 TABS  IN 24 HRS. 10 tablet 0  . tiZANidine (ZANAFLEX) 4 MG tablet TAKE 1 TABLET BY MOUTH THREE TIMES DAILY AS NEEDED 270 tablet 0  . topiramate (TOPAMAX) 100 MG tablet TAKE 1 TABLET(100 MG) BY MOUTH TWICE DAILY 60 tablet 0  . varenicline (CHANTIX CONTINUING MONTH PAK) 1 MG tablet Take 1 tablet (1 mg total) by mouth 2 (two) times daily. 60 tablet 2  . vitamin B-12 (CYANOCOBALAMIN) 1000 MCG tablet Take 1,000 mcg by mouth daily.    Marland Kitchen LORazepam (ATIVAN) 0.5 MG tablet Take 0.5-1 tablets (0.25-0.5 mg total) by mouth daily. Take 0.25 mg in the AM and 0.5 mg po qhs 45 tablet 0  . omeprazole (PRILOSEC) 40 MG capsule Take 1 capsule (40 mg total) by mouth daily. 90 capsule 0  . traZODone (DESYREL) 100 MG tablet TAKE 1 AND 1/2 TABLETS(150 MG) BY MOUTH AT BEDTIME 135 tablet 1   No current facility-administered medications for this visit.      Musculoskeletal: Strength & Muscle Tone: within normal limits Gait & Station: normal Patient leans: N/A  Psychiatric Specialty Exam: Review of Systems  Constitutional:       Hot and cold feeling  Psychiatric/Behavioral: The patient is nervous/anxious and has insomnia.   All other systems reviewed and are negative.   Blood pressure (!) 156/95, pulse (!) 106, temperature 97.8 F (36.6 C), temperature source Oral, weight 196 lb 3.2 oz (89 kg).Body mass index is 35.89 kg/m.  General Appearance: Casual  Eye Contact:  Fair  Speech:  Normal Rate  Volume:  Normal  Mood:  Anxious and Dysphoric  Affect:  Congruent  Thought Process:  Goal Directed and Descriptions of Associations: Intact  Orientation:  Full (Time, Place, and Person)  Thought Content: Logical   Suicidal Thoughts:  No  Homicidal Thoughts:  No  Memory:  Immediate;   Fair Recent;   Fair Remote;   Fair  Judgement:  Fair  Insight:  Fair  Psychomotor Activity:  Normal  Concentration:  Concentration: Fair and Attention Span: Fair  Recall:  [employer] of Knowledge: Fair  Language: Fair  Akathisia:  No   Handed:  Right  AIMS (if indicated): NA  Assets:  Communication Skills Desire for Improvement Housing Social Support Transportation  ADL's:  Intact  Cognition: WNL  Sleep:  Poor   Screenings: PHQ2-9     Clinical Support from 12/13/2017 in [facility] Visit from 05/26/2017 in [place] Visit from 07/10/2016 in [facility] Visit from 05/21/2016 in [facility] Procedure visit from 03/09/2016 in [facility]  PHQ-2 Total Score  0  3  3  0  0  PHQ-9 Total Score  7  14  7   -  -       Assessment and Plan: Reianna is a 57 year old Caucasian female who has a history of depression, currently in remission, recent history of delirium due to multiple etiologies, as well as multiple medical problems including tremors, recent fall, migraine headaches, vitamin B12 deficiency, MCI, gait instability, bilateral knee pain, presented to the clinic today for a follow-up visit.  Patient does have an extensive history of mental health problems in the past and has had several inpatient admissions in the past.  Pt was referred to the clinic here by neurology to get a second opinion on her psychiatric medication due to her recent confusion, memory loss and falls.  Patient was initially seen on 12/22/2017.  Patient today seems to be having some withdrawal symptoms to the dose reduction of lorazepam which was done 2 weeks ago.  She complains of hot and cold feeling.  Discussed with her that the dose can be readjusted today to see if that will help with the withdrawal symptoms.  Provided education about benzodiazepine withdrawal/dependence.  Patient agrees with plan.  Plan  MDD, likely in remission. Continue doxepin 25 mg p.o. nightly Increase trazodone to 150 mg p.o. nightly Restart lorazepam 0.25 mg p.o. daily and 0.5 mg p.o. nightly.  She did not tolerate the dose reduction done  last visit.  Discussed with her to return to clinic in 1 week.  Discussed the risk of being on benzodiazepines including cognitive problems, falls, confusion, withdrawal symptoms.  Discussed with her that if her withdrawal symptoms get worse to go to the nearest emergency department.  For history of delirium, resolved. Likely due to UTI, dehydration as well as polypharmacy. She continues to be alert and oriented today.  For history of tremors. Patient reports she has had tremors since a very long time.  She reports tremors is improving now.  She is also on medications like Depakote which can contribute to tremors.  Her Depakote has been prescribed by her neurologist for migraine headaches.  Discussed with her to discuss the same with her doctor.  For anxiety symptoms She is on lorazepam which is currently being tapered down.  Her dose today readjusted back to 0.25 mg in the morning and 0.5 mg p.o. nightly Continue BuSpar as prescribed.  For MCI Most recent MMSE is 26/30-per neurology. She is also on vitamin B12 replacement. Continue to monitor.  Unable to  obtain medical records from Dr. Kasandra Knudsen.  Will attempt again.  Follow-up in clinic in 1 week or sooner if needed.  More than 50 % of the time was spent for psychoeducation and supportive psychotherapy and care coordination.  This note was generated in part or whole with voice recognition software. Voice recognition is usually quite accurate but there are transcription errors that can and very often do occur. I apologize for any typographical errors that were not detected and corrected.          Ursula Alert, MD 01/07/2018, 12:57 PM

## 2018-01-06 NOTE — Patient Instructions (Addendum)
Please start taking Lorazepam 0.25 mg daily in the morning and 0.5 mg at bedtime.  Please come back and see me in 1 week.  Please start taking Trazodone 150 mg at bedtime for sleep.  Benzodiazepine Withdrawal Benzodiazepines are prescription medicines that decrease the activity of (depress) the central nervous system and cause changes in certain brain chemicals (neurotransmitters). Withdrawal is a group of physical and mental symptoms that can happen when you suddenly stop taking a medicine. There are many types of benzodiazepines. Some benzodiazepines take effect quickly and stay in your system for a short amount of time (short-acting). Other benzodiazepines require more time to take effect and stay in your system for longer amounts of time (long-acting). The five most commonly prescribed benzodiazepines are:  Alprazolam.  Lorazepam.  Clonazepam.  Diazepam.  Temazepam.  What are the causes? When you take benzodiazepines, your brain needs more and more of the medicine over time in order to get the same effects from it. This increased need is called tolerance. As you develop a tolerance, your brain adapts to the effects of the benzodiazepine and relies on these effects. This is called dependency. Withdrawal happens when you suddenly stop taking your medicine. This does not give your brain enough time to adapt to not having the medicine. What increases the risk? This condition is more likely to develop in:  People who have taken benzodiazepines for more than 1-2 weeks.  People who have developed a tolerance for benzodiazepines.  People who have developed a dependence on benzodiazepines.  People who take high dosages of benzodiazepines.  People who take doses of benzodiazepines that are higher than prescribed.  People who take benzodiazepines without a prescription.  People who use benzodiazepines with other substances that depress the central nervous system, such as  alcohol.  People who have a history of drug or alcohol abuse.  What are the signs or symptoms? Symptoms of this condition may include:  Difficulty sleeping.  Anxiety.  Restlessness.  Irritability.  Muscle aches.  Involuntary shaking or trembling of a body part (tremor).  Confusion and poor concentration.  Vomiting.  Sweating.  Headaches.  Feeling or seeing things that are not there (hallucinations).  Seizures.  Symptoms of withdrawal from short-acting benzodiazepines may develop 1-2 days after you stop taking your medicine, and they may last for 2-4 weeks or longer. Symptoms of withdrawal from long-acting benzodiazepines may develop 2-7 days after you stop taking your medicine, and they may last for 2-8 weeks or longer. How is this diagnosed? This condition may be diagnosed based on:  Your symptoms.  A physical exam. Your health care provider may check for: ? Rapid heartbeat. ? Rapid breathing. ? Tremors. ? High blood pressure.  Blood tests.  Urine tests.  Your alcohol and drug habits.  Your medical history.  How is this treated? Treatment for this condition depends on:  Your symptoms.  The type of benzodiazepine you have been taking.  How long you have been taking benzodiazepines.  Treatment usually involves starting you on a safe and stable dose of a benzodiazepine and then slowly lowering your dosage over time (tapered withdrawal). This may be done at a hospital or a treatment center. Long-term treatment for this condition may involve medicine, counseling, and support groups. Follow these instructions at home:  Take over-the-counter and prescription medicines only as told by your health care provider.  Check with your health care provider before starting new medicines.  Keep all follow-up visits as told by your health  care provider. This is important. How is this prevented?  Do not take any benzodiazepines without a prescription.  Do not  take more than your prescribed dosage.  Do not mix benzodiazepines with alcohol or other medicines.  Do not stop taking benzodiazepines without speaking with your health care provider. Contact a health care provider if:  You are not able to take your medicines as told by your health care provider.  You have symptoms that get worse.  You develop withdrawal symptoms during your tapered withdrawal.  You develop a craving for drugs or alcohol.  You experience withdrawal again (relapse). Get help right away if:  You have a seizure.  You become very confused.  You lose consciousness.  You have difficulty breathing.  You have serious thoughts about hurting yourself or someone else. This information is not intended to replace advice given to you by your health care provider. Make sure you discuss any questions you have with your health care provider. Document Released: 09/24/2011 Document Revised: 02/13/2016 Document Reviewed: 03/27/2015 Elsevier Interactive Patient Education  [employer].

## 2018-01-07 ENCOUNTER — Encounter: Payer: Self-pay | Admitting: Psychiatry

## 2018-01-11 ENCOUNTER — Other Ambulatory Visit: Payer: Self-pay | Admitting: Family Medicine

## 2018-01-12 NOTE — Telephone Encounter (Signed)
CHECKED   WITH [facility]  FILLED  ALREADY

## 2018-01-13 ENCOUNTER — Ambulatory Visit
Admission: RE | Admit: 2018-01-13 | Discharge: 2018-01-13 | Disposition: A | Discharge status: Home / Self Care | Payer: Medicare Other | Source: Ambulatory Visit | Attending: Family Medicine | Admitting: Family Medicine

## 2018-01-13 ENCOUNTER — Encounter: Payer: Self-pay | Admitting: Family Medicine

## 2018-01-13 DIAGNOSIS — M79621 Pain in right upper arm: Secondary | ICD-10-CM

## 2018-01-13 DIAGNOSIS — Z1231 Encounter for screening mammogram for malignant neoplasm of breast: Secondary | ICD-10-CM | POA: Insufficient documentation

## 2018-01-13 DIAGNOSIS — N644 Mastodynia: Secondary | ICD-10-CM | POA: Diagnosis not present

## 2018-01-13 DIAGNOSIS — Z1239 Encounter for other screening for malignant neoplasm of breast: Secondary | ICD-10-CM

## 2018-01-13 DIAGNOSIS — R928 Other abnormal and inconclusive findings on diagnostic imaging of breast: Secondary | ICD-10-CM | POA: Diagnosis not present

## 2018-01-14 ENCOUNTER — Ambulatory Visit: Payer: Medicare Other | Admitting: Psychiatry

## 2018-01-14 ENCOUNTER — Ambulatory Visit: Payer: Medicare Other | Admitting: Family Medicine

## 2018-01-14 ENCOUNTER — Encounter: Payer: Self-pay | Admitting: Psychiatry

## 2018-01-14 ENCOUNTER — Other Ambulatory Visit: Payer: Self-pay

## 2018-01-14 VITALS — BP 103/70 | HR 73 | Temp 97.5°F | Wt 195.8 lb

## 2018-01-14 DIAGNOSIS — F330 Major depressive disorder, recurrent, mild: Secondary | ICD-10-CM | POA: Diagnosis not present

## 2018-01-14 DIAGNOSIS — G3184 Mild cognitive impairment, so stated: Secondary | ICD-10-CM

## 2018-01-14 DIAGNOSIS — F1320 Sedative, hypnotic or anxiolytic dependence, uncomplicated: Secondary | ICD-10-CM | POA: Diagnosis not present

## 2018-01-14 DIAGNOSIS — F05 Delirium due to known physiological condition: Secondary | ICD-10-CM | POA: Diagnosis not present

## 2018-01-14 NOTE — Progress Notes (Signed)
[facility] MD OP Progress Note  01/14/2018 12:54 PM Susan Houston  MRN:  [id number]  Chief Complaint: ' I am ok." Chief Complaint    Follow-up; Medication Refill     HPI: Carmellia is a 57 year old Caucasian female who is married, unemployed, lives in [place], has a history of depression, vitamin B12 deficiency, mild cognitive impairment, migraine, COPD, chronic pain, degenerative disc disease, tremors, hyperlipidemia, presented to the clinic today for a follow-up visit.  Takila today reports that she is currently doing well on the Ativan.  Shaquella had some severe withdrawal symptoms last week and hence was started back on a low-dose of Ativan in the morning.  Patient did not tolerate the Ativan taper off well.  Patient is currently on 0.25 mg in the morning and 0.5 at bedtime.  Discussed with her that we can continue the taper off but more gradually.  Patient agrees with plan.  Patient has been on Ativan since the past 13 years or so.  Jennilyn today presents as pleasant.  She is alert and oriented.  She denies any depressive symptoms.  She denies any suicidality or perceptual disturbances.  Misao reports she continues to have good social support from her husband.  He continues to assist her with giving her her medications.  Nikeisha reports physically she is doing well.  She was scheduled for physical therapy by her providers however she reports she does not want to continue the same.  She reports she feels motivated and has been active herself and does not feel the need for PT.  Patient reports she had a mammogram done recently because there was a lump on her breast.  Patient however reports everything came back within normal limits and she is relieved.  Patient denies any substance abuse problems at this time.  Patient continues to enjoy her granddaughter. Visit Diagnosis:    ICD-10-CM   1. MDD (major depressive disorder), recurrent episode, mild ([place]) F33.0   2. MCI (mild cognitive impairment) G31.84   3.  Delirium due to multiple etiologies F05    resolved  4. Benzodiazepine dependence (H[facility]) F13.20    prescribed , currently being weaned off    Past Psychiatric History: Has a history of depression dating back to 13 years ago.  Patient reports she had some marital stressors at that time and she had multiple inpatient admissions at [facility], [facility], [facility].  Patient denies any suicidality.  Past trials of Lexapro.  Past Medical History:  Past Medical History:  Diagnosis Date  . Anxiety   . Arthritis   . Asthma   . Back pain   . Bruises easily   . Cancer ([place]) 2009   [facility] CANCER - CHEMO / RADIATION (NO SURGERY)   . Chronic leg pain    BILATERAL  . COPD (chronic obstructive pulmonary disease) ([place])   . Depression   . Difficulty sleeping   . Diverticulitis   . Hemorrhoids   . History of kidney stones   . Hyperlipidemia   . Hypertension   . Incontinence of urine   . Migraine   . MRSA infection greater than 3 months ago    2008 right side of face  . Nerve damage    right leg    Past Surgical History:  Procedure Laterality Date  . ABDOMINAL HYSTERECTOMY  1983  . BLADDER SUSPENSION  2014  . CHOLECYSTECTOMY    . COLONOSCOPY    . ESOPHAGOGASTRODUODENOSCOPY    . ESOPHAGOGASTRODUODENOSCOPY (EGD) WITH PROPOFOL N/A 11/20/2016  Procedure: ESOPHAGOGASTRODUODENOSCOPY (EGD) WITH PROPOFOL;  Surgeon: Jonathon Bellows, MD;  Location: [facility] ENDOSCOPY;  Service: Endoscopy;  Laterality: N/A;  . EXCISION NEUROMA Bilateral 09/24/2014   Procedure: BILATERAL OPEN SAPHENOUS NEURECTOMIES AND OPEN LEFT PERIPATELLA OSETOPHYTECTOMY;  Surgeon: Mauri Pole, MD;  Location: [facility];  Service: Orthopedics;  Laterality: Bilateral;  . JOINT REPLACEMENT  2009/2010   BIL TOTAL KNEES  . TONSILLECTOMY      Family Psychiatric History: Sister-mental health problems, 3 brothers-alcoholism (deceased)  Family History:  Family History  Problem Relation Age of Onset  . Asthma Mother   . Diabetes Mother   .  Hyperlipidemia Mother   . Hypertension Mother   . Cirrhosis Mother        Non-alcoholic  . Arthritis Father   . Cancer Father        Bone  . Hyperlipidemia Father   . Hypertension Father   . Anxiety disorder Sister   . Depression Sister   . Kidney Stones Son   . Stroke Maternal Grandmother   . Breast cancer Maternal Grandmother 25  . Cancer Maternal Grandfather        lung  . Pneumonia Paternal Grandmother   . Alzheimer's disease Paternal Grandfather   . Diabetes Sister   . Gout Sister   . Hypertension Sister   . Alcohol abuse Brother   . Heart disease Brother        massive MI   Substance abuse history: Denies Takes benzodiazepines  prescribed.  She has been on it since the past 13 years.  Social History: Pt reports she was mainly raised by her mother and stepdad.  Her biological father left when she was 35 years old.  She was later on adopted by her stepdad.  She went up to 12th grade.  She went to  [employer] and served for 4 years.  She had an honorable discharge.  She has been married to her husband since the past 34 years.  She has 3 children altogether, one from  current husband and 2 from her ex-husband.  All her children are adults.  She has 1 grandchild Harvest Dark, who is 78 years old.  She helps take care of her granddaughter every day and helps her to get ready for school and also takes care of her when she comes back most days. Social History   Socioeconomic History  . Marital status: Married    Spouse name: Tharon Aquas  . Number of children: 2  . Years of education: 48  . Highest education level: 12th grade  Occupational History  . Occupation: Disabled  Social Needs  . Financial resource strain: Not hard at all  . Food insecurity:    Worry: Never true    Inability: Never true  . Transportation needs:    Medical: No    Non-medical: No  Tobacco Use  . Smoking status: Former Smoker    Packs/day: 0.50    Types: Cigarettes    Last attempt to quit: 04/07/2016    Years  since quitting: 1.7  . Smokeless tobacco: Never Used  . Tobacco comment: patient has not smoked x 10 days.06/28/17 chantix  Substance and Sexual Activity  . Alcohol use: No    Alcohol/week: 0.0 oz  . Drug use: No  . Sexual activity: Never  Lifestyle  . Physical activity:    Days per week: 0 days    Minutes per session: 0 min  . Stress: Not at all  Relationships  . Social connections:  Talks on phone: Never    Gets together: Twice a week    Attends religious service: More than 4 times per year    Active member of club or organization: No    Attends meetings of clubs or organizations: Never    Relationship status: Married  Other Topics Concern  . Not on file  Social History Narrative   Lives at home w/ her husband   Right-handed   Caffeine: occasionally    Allergies:  Allergies  Allergen Reactions  . Lexapro [Escitalopram Oxalate] Other (See Comments)    Keeps awake for days.   . Morphine And Related Itching    Metabolic Disorder Labs: No results found for: HGBA1C, MPG No results found for: PROLACTIN Lab Results  Component Value Date   CHOL 169 12/03/2017   TRIG 126 12/03/2017   HDL 45 12/03/2017   LDLCALC 99 12/03/2017   LDLCALC 87 07/10/2016   Lab Results  Component Value Date   TSH 3.16 05/21/2014   TSH 1.12 05/14/2014    Therapeutic Level Labs: No results found for: LITHIUM No results found for: VALPROATE No components found for:  CBMZ  Current Medications: Current Outpatient Medications  Medication Sig Dispense Refill  . aspirin 81 MG tablet Take 81 mg by mouth daily.    . busPIRone (BUSPAR) 15 MG tablet Take 15 mg by mouth daily.    Marland Kitchen doxepin (SINEQUAN) 25 MG capsule Take 1 capsule (25 mg total) by mouth at bedtime. 30 capsule 1  . gabapentin (NEURONTIN) 300 MG capsule Take 300 mg by mouth 3 (three) times daily.    Marland Kitchen LORazepam (ATIVAN) 0.5 MG tablet Take 0.5-1 tablets (0.25-0.5 mg total) by mouth daily. Take 0.25 mg in the AM and 0.5 mg po qhs 45  tablet 0  . magnesium citrate solution Take 148 mLs by mouth daily. If not better on the milk of magnesia, try this 300 mL 6  . magnesium hydroxide (MILK OF MAGNESIA) 400 MG/5ML suspension Take 30 mLs by mouth daily as needed for mild constipation. 360 mL 6  . nystatin ointment (MYCOSTATIN) Apply 1 application topically 2 (two) times daily. 30 g 2  . ondansetron (ZOFRAN-ODT) 4 MG disintegrating tablet DISSOLVE 1 TABLET(4 MG) ON THE TONGUE EVERY 12 HOURS AS NEEDED FOR NAUSEA    . SUMAtriptan (IMITREX) 100 MG tablet TAKE 1 TAB BY MOUTH AT START OF MIGRAINE. MAY REPEAT IN 2 HOURS IF HEADACHE PERSISTS OR RECURS. MAX: 2 TABS IN 24 HRS. 10 tablet 0  . tiZANidine (ZANAFLEX) 4 MG tablet TAKE 1 TABLET BY MOUTH THREE TIMES DAILY AS NEEDED 270 tablet 0  . topiramate (TOPAMAX) 100 MG tablet TAKE 1 TABLET(100 MG) BY MOUTH TWICE DAILY 60 tablet 0  . traZODone (DESYREL) 100 MG tablet TAKE 1 AND 1/2 TABLETS(150 MG) BY MOUTH AT BEDTIME 135 tablet 1  . varenicline (CHANTIX CONTINUING MONTH PAK) 1 MG tablet Take 1 tablet (1 mg total) by mouth 2 (two) times daily. 60 tablet 2  . vitamin B-12 (CYANOCOBALAMIN) 1000 MCG tablet Take 1,000 mcg by mouth daily.    Marland Kitchen omeprazole (PRILOSEC) 40 MG capsule Take 1 capsule (40 mg total) by mouth daily. 90 capsule 0   No current facility-administered medications for this visit.      Musculoskeletal: Strength & Muscle Tone: within normal limits Gait & Station: normal Patient leans: N/A  Psychiatric Specialty Exam: Review of Systems  Psychiatric/Behavioral: The patient is nervous/anxious (improved).   All other systems reviewed and are negative.  Blood pressure 103/70, pulse 73, temperature (!) 97.5 F (36.4 C), temperature source Oral, weight 195 lb 12.8 oz (88.8 kg).Body mass index is 35.81 kg/m.  General Appearance: Casual  Eye Contact:  Fair  Speech:  Clear and Coherent  Volume:  Normal  Mood:  Anxious  Affect:  Appropriate  Thought Process:  Goal Directed and  Descriptions of Associations: Intact  Orientation:  Full (Time, Place, and Person)  Thought Content: Logical   Suicidal Thoughts:  No  Homicidal Thoughts:  No  Memory:  Immediate;   Fair Recent;   Fair Remote;   Fair  Judgement:  Fair  Insight:  Fair  Psychomotor Activity:  Normal  Concentration:  Concentration: Fair and Attention Span: Fair  Recall:  [employer] of Knowledge: Fair  Language: Fair  Akathisia:  No  Handed:  Right  AIMS (if indicated): 0  Assets:  Communication Skills Desire for Improvement Social Support  ADL's:  Intact  Cognition: WNL  Sleep:  Fair   Screenings: PHQ2-9     Clinical Support from 12/13/2017 in [facility] Visit from 05/26/2017 in [facility] Visit from 07/10/2016 in [facility] Visit from 05/21/2016 in [facility] Procedure visit from 03/09/2016 in [facility]  PHQ-2 Total Score  0  3  3  0  0  PHQ-9 Total Score  7  14  7   -  -       Assessment and Plan: Susan Houston is a 57 year old Caucasian female who has a history of depression, currently in remission, recent history of delirium due to multiple etiologies as well as multiple medical problems including tremors, recent fall, migraine headaches, vitamin B12 deficiency, MCI, gait instability, bilateral knee pain, presented to the clinic today for a follow-up visit.  Patient does have a history of extensive mental health problems in the past and has had several inpatient mental health admissions in the past.  Patient was referred to the clinic here by neurology to get a second opinion on her psychiatric medications initially due to her most recent confusion, memory loss and falls.  Patient did not tolerate the initial tapering off of lorazepam to a lower dose.  Patient hence was placed back on 0.25 mg in the morning and 0.5 mg at bedtime.  Patient is currently doing well  on this dose.  We will hence continue to taper off her lorazepam however more gradually.  Discussed this with patient.  She agrees with plan.  Plan MDD, likely in remission Continue doxepin 25 mg p.o. nightly Trazodone was increased back to 150 mg p.o. nightly a week ago.  She is tolerating it well. Continue lorazepam taper off, currently at 0.25 mg p.o. daily and she will reduced the dose to 0.25 mg p.o. nightly.  She will stay on that dose until she comes back to see me in 4 weeks.  Chest benzodiazepine withdrawal symptoms and discussed with her that if she has severe withdrawal symptoms to go to the nearest emergency department.  History of delirium, resolved. Likely due to UTI, dehydration as well as polypharmacy. She continues to be alert and oriented today.  For history of tremors She will continue to follow-up with her neurology.  She is on Depakote prescribed for migraine which can also contribute to the same.  For anxiety symptoms We will taper off lorazepam gradually as discussed above. Continue BuSpar as prescribed.  For MCI Most recent  MMSE is 26 out of 30 per neurology. She is also on vitamin B12 replacement.  Follow up in clinic in 4 weeks or sooner if needed.  We will continue to attempt to obtain medical records from Dr. Kasandra Knudsen.  More than 50 % of the time was spent for psychoeducation and supportive psychotherapy and care coordination.  This note was generated in part or whole with voice recognition software. Voice recognition is usually quite accurate but there are transcription errors that can and very often do occur. I apologize for any typographical errors that were not detected and corrected.       Ursula Alert, MD 01/14/2018, 12:54 PM

## 2018-01-14 NOTE — Patient Instructions (Signed)
Please continue taking Ativan 0.25 mg in the morning.  This is your current dose. Please start taking Ativan 0.25 mg at bedtime.  Now you are on 0.5 mg,  your dose has been reduced. Follow-up in clinic in 4 weeks.

## 2018-01-17 ENCOUNTER — Ambulatory Visit: Payer: Medicare Other

## 2018-01-19 ENCOUNTER — Other Ambulatory Visit: Payer: Self-pay | Admitting: Psychiatry

## 2018-01-19 DIAGNOSIS — F330 Major depressive disorder, recurrent, mild: Secondary | ICD-10-CM

## 2018-01-20 ENCOUNTER — Ambulatory Visit: Payer: Medicare Other

## 2018-01-24 ENCOUNTER — Ambulatory Visit: Payer: Medicare Other | Admitting: Physical Therapy

## 2018-01-31 ENCOUNTER — Ambulatory Visit: Payer: Medicare Other | Admitting: Physical Therapy

## 2018-02-07 ENCOUNTER — Ambulatory Visit: Payer: Medicare Other | Admitting: Physical Therapy

## 2018-02-11 ENCOUNTER — Ambulatory Visit: Payer: Medicare Other | Admitting: Psychiatry

## 2018-02-17 DIAGNOSIS — H2512 Age-related nuclear cataract, left eye: Secondary | ICD-10-CM | POA: Diagnosis not present

## 2018-02-23 DIAGNOSIS — R3915 Urgency of urination: Secondary | ICD-10-CM | POA: Diagnosis not present

## 2018-02-23 DIAGNOSIS — R350 Frequency of micturition: Secondary | ICD-10-CM | POA: Diagnosis not present

## 2018-02-25 ENCOUNTER — Other Ambulatory Visit: Payer: Self-pay | Admitting: Urology

## 2018-02-25 NOTE — Discharge Instructions (Signed)

## 2018-02-28 ENCOUNTER — Ambulatory Visit: Payer: Medicare Other | Admitting: Urology

## 2018-02-28 ENCOUNTER — Encounter: Payer: Self-pay | Admitting: Urology

## 2018-02-28 MED ORDER — MIRABEGRON ER 50 MG PO TB24: 50.0000 mg | ORAL_TABLET | Freq: Every day | ORAL | 11 refills | Status: DC

## 2018-02-28 NOTE — Progress Notes (Unsigned)
02/28/2018 3:43 PM   Susan Houston [birth year] [id number]  Referring provider: Valerie Roys, DO [place], [place] [address]  No chief complaint on file.   HPI: Susan Houston: Patient was assessed for mixed incontinence.  She was getting up 3 or 6 times a night and a residual was 217 mL.  She had one positive urinary tract infection.  She has had an anterior repair and pubovaginal sling using a TVT.  She describes retention afterwards needing a catheter for a month.  She did clean intermittent catheterization.  Today The patient leaks with coughing and sneezing and laughing as well as with urgency and both are significant.  She does have bedwetting but is not every night and she reports moderate severity.  She does sometimes have foot on the floor syndrome.  She says she can wear 15 pads a day moderately wet.  She voids every hour or sooner and cannot hold urination for 2 hours.  She gets up 6 or 7 times a night and sometimes has ankle edema.  Sometimes her flow is reasonable and other times it stops and starts and she does not feel empty.  She no longer catheterizes    GU: Pelvic examination the patient had grade 1 hypermobility of the bladder neck and a negative cough test.  The sling is likely at the proximal third of the urethra.  She had a grade 1 cystocele but really no hinge effect.  I could feel at the right urethrovesical angle the sling but in my opinion there was no extrusion.  The patient has mixed incontinence and bedwetting.  She had retention issues post previous sling.  She has very significant frequency and nocturia.  She was scanned again for 307 mL. The patient has elevated residual urine volumes by bladder scan and history.  I recommended urodynamics and future cystoscopy and we will proceed accordingly.  It will be interesting to see the severity of her stress incontinence during urodynamic   Frequency and incontinence are stable.  She had not voided an hour  previously and was catheterized for 75 mL.  Her maximum bladder capacity was 513 mL.  She had a little bit of sensory urgency.  She had low pressure overactivity reaching a pressure of 4 cm of water Feltus urgency but she did not leak.  She did not leak with a Valsalva pressure 138 cm of water.  It took a long time for her to void voluntarily and she was coached not to strain.  She voided 480 mL with a maximum flow 21 mils per second.  Maximum voiding pressure is 33 cm water.  Residual was 30 mL.  EMG activity increased during the voiding phase.  The there was no fluoroscopy.  the details of the urodynamics are signed and dictated    PMH: Past Medical History:  Diagnosis Date  . Anxiety   . Arthritis   . Asthma   . Back pain   . Bruises easily   . Cancer ([place]) 2009   LARYNX CANCER - CHEMO / RADIATION (NO SURGERY)   . Chronic leg pain    BILATERAL  . COPD (chronic obstructive pulmonary disease) ([place])   . Depression   . Difficulty sleeping   . Diverticulitis   . Hemorrhoids   . History of kidney stones   . Hyperlipidemia   . Hypertension   . Incontinence of urine   . Migraine   . MRSA infection greater than 3 months ago  2008 right side of face  . Nerve damage    right leg    Surgical History: Past Surgical History:  Procedure Laterality Date  . ABDOMINAL HYSTERECTOMY  1983  . BLADDER SUSPENSION  2014  . CHOLECYSTECTOMY    . COLONOSCOPY    . ESOPHAGOGASTRODUODENOSCOPY    . ESOPHAGOGASTRODUODENOSCOPY (EGD) WITH PROPOFOL N/A 11/20/2016   Procedure: ESOPHAGOGASTRODUODENOSCOPY (EGD) WITH PROPOFOL;  Surgeon: Jonathon Bellows, MD;  Location: [facility] ENDOSCOPY;  Service: Endoscopy;  Laterality: N/A;  . EXCISION NEUROMA Bilateral 09/24/2014   Procedure: BILATERAL OPEN SAPHENOUS NEURECTOMIES AND OPEN LEFT PERIPATELLA OSETOPHYTECTOMY;  Surgeon: Mauri Pole, MD;  Location: [facility];  Service: Orthopedics;  Laterality: Bilateral;  . JOINT REPLACEMENT  2009/2010   BIL TOTAL KNEES  .  TONSILLECTOMY      Home Medications:  Allergies as of 02/28/2018      Reactions   Lexapro [escitalopram Oxalate] Other (See Comments)   Keeps awake for days.    Morphine And Related Itching      Medication List        Accurate as of 02/28/18  3:43 PM. Always use your most recent med list.          aspirin 81 MG tablet Take 81 mg by mouth daily.   busPIRone 15 MG tablet Commonly known as:  BUSPAR Take 15 mg by mouth daily.   doxepin 25 MG capsule Commonly known as:  SINEQUAN TAKE ONE CAPSULE BY MOUTH AT BEDTIME   gabapentin 300 MG capsule Commonly known as:  NEURONTIN Take 300 mg by mouth 3 (three) times daily.   LORazepam 0.5 MG tablet Commonly known as:  ATIVAN Take 0.5-1 tablets (0.25-0.5 mg total) by mouth daily. Take 0.25 mg in the AM and 0.5 mg po qhs   omeprazole 40 MG capsule Commonly known as:  PRILOSEC Take 1 capsule (40 mg total) by mouth daily.   ondansetron 4 MG disintegrating tablet Commonly known as:  ZOFRAN-ODT DISSOLVE 1 TABLET(4 MG) ON THE TONGUE EVERY 12 HOURS AS NEEDED FOR NAUSEA   SUMAtriptan 100 MG tablet Commonly known as:  IMITREX TAKE 1 TAB BY MOUTH AT START OF MIGRAINE. MAY REPEAT IN 2 HOURS IF HEADACHE PERSISTS OR RECURS. MAX: 2 TABS IN 24 HRS.   traZODone 100 MG tablet Commonly known as:  DESYREL TAKE 1 AND 1/2 TABLETS(150 MG) BY MOUTH AT BEDTIME   varenicline 1 MG tablet Commonly known as:  CHANTIX CONTINUING MONTH PAK Take 1 tablet (1 mg total) by mouth 2 (two) times daily.   vitamin B-12 1000 MCG tablet Commonly known as:  CYANOCOBALAMIN Take 1,000 mcg by mouth daily.       Allergies:  Allergies  Allergen Reactions  . Lexapro [Escitalopram Oxalate] Other (See Comments)    Keeps awake for days.   . Morphine And Related Itching    Family History: Family History  Problem Relation Age of Onset  . Asthma Mother   . Diabetes Mother   . Hyperlipidemia Mother   . Hypertension Mother   . Cirrhosis Mother         Non-alcoholic  . Arthritis Father   . Cancer Father        Bone  . Hyperlipidemia Father   . Hypertension Father   . Anxiety disorder Sister   . Depression Sister   . Kidney Stones Son   . Stroke Maternal Grandmother   . Breast cancer Maternal Grandmother 24  . Cancer Maternal Grandfather  lung  . Pneumonia Paternal Grandmother   . Alzheimer's disease Paternal Grandfather   . Diabetes Sister   . Gout Sister   . Hypertension Sister   . Alcohol abuse Brother   . Heart disease Brother        massive MI    Social History:  reports that she quit smoking about 22 months ago. Her smoking use included cigarettes. She smoked 0.50 packs per day. She has never used smokeless tobacco. She reports that she does not drink alcohol or use drugs.  ROS: UROLOGY Frequent Urination?: Yes Hard to postpone urination?: Yes Burning/pain with urination?: No Get up at night to urinate?: Yes Leakage of urine?: Yes Urine stream starts and stops?: No Trouble starting stream?: No Do you have to strain to urinate?: No Blood in urine?: No Urinary tract infection?: No Sexually transmitted disease?: No Injury to kidneys or bladder?: No Painful intercourse?: No Weak stream?: No Currently pregnant?: No Vaginal bleeding?: No Last menstrual period?: n  Gastrointestinal Nausea?: No Vomiting?: No Indigestion/heartburn?: No Diarrhea?: No Constipation?: No  Constitutional Fever: No Night sweats?: Yes Weight loss?: Yes Fatigue?: No  Skin Skin rash/lesions?: No Itching?: No  Eyes Blurred vision?: No Double vision?: No  Ears/Nose/Throat Sore throat?: No Sinus problems?: No  Hematologic/Lymphatic Swollen glands?: No Easy bruising?: Yes  Cardiovascular Leg swelling?: No Chest pain?: No  Respiratory Cough?: No Shortness of breath?: No  Endocrine Excessive thirst?: No  Musculoskeletal Back pain?: No Joint pain?: No  Neurological Headaches?: No Dizziness?:  No  Psychologic Depression?: No Anxiety?: No  Physical Exam: BP (!) 147/106 (BP Location: Right Arm, Patient Position: Sitting, Cuff Size: Large)   Pulse (!) 101   Ht 5\' 2"  (1.575 m)   Wt 190 lb 14.4 oz (86.6 kg)   BMI 34.92 kg/m     Laboratory Data: Lab Results  Component Value Date   WBC 4.1 12/06/2017   HGB 13.5 12/06/2017   HCT 39.8 12/06/2017   MCV 89.8 12/06/2017   PLT 231 12/06/2017    Lab Results  Component Value Date   CREATININE 0.81 12/06/2017    No results found for: PSA  No results found for: TESTOSTERONE  No results found for: HGBA1C  Urinalysis    Component Value Date/Time   COLORURINE YELLOW (A) 12/06/2017 1704   APPEARANCEUR Cloudy (A) 01/03/2018 1522   LABSPEC 1.012 12/06/2017 1704   LABSPEC 1.012 10/19/2013 1810   PHURINE 5.0 12/06/2017 1704   GLUCOSEU Negative 01/03/2018 1522   GLUCOSEU Negative 10/19/2013 1810   HGBUR NEGATIVE 12/06/2017 1704   BILIRUBINUR Negative 01/03/2018 1522   BILIRUBINUR Negative 10/19/2013 1810   KETONESUR NEGATIVE 12/06/2017 1704   PROTEINUR Negative 01/03/2018 1522   PROTEINUR NEGATIVE 12/06/2017 1704   UROBILINOGEN 1.0 09/07/2014 1157   NITRITE Negative 01/03/2018 1522   NITRITE NEGATIVE 12/06/2017 1704   LEUKOCYTESUR 1+ (A) 01/03/2018 1522   LEUKOCYTESUR 1+ 10/19/2013 1810    Pertinent Imaging:   Assessment & Plan: The patient primarily has an overactive bladder.  She now empties efficiently.  She did not have stress incontinence on pelvic examination and urodynamics.  One would need to re-quantitate the stress component if she failed medical and behavioral therapy before offering a sling versus a refractory overactive bladder therapy.  She has had a past history of retention with the sling and perhaps urethral injectables would be more prudent based upon stress incontinence severity in her past history.  Hopefully we can help her with overactive bladder therapies.  Reassess  in 5 weeks on Myrbetriq  samples and prescription and I utilized 50 mg  There are no diagnoses linked to this encounter.  No follow-ups on file.  Reece Packer, MD  [facility] [address] [place], [place] [address] [phone]

## 2018-03-02 ENCOUNTER — Ambulatory Visit
Admission: RE | Admit: 2018-03-02 | Discharge: 2018-03-02 | Disposition: A | Discharge status: Home / Self Care | Payer: Medicare Other | Source: Ambulatory Visit | Attending: Ophthalmology | Admitting: Ophthalmology

## 2018-03-02 ENCOUNTER — Encounter
Admission: RE | Disposition: A | Discharge status: Home / Self Care | Payer: Self-pay | Source: Ambulatory Visit | Attending: Ophthalmology

## 2018-03-02 ENCOUNTER — Emergency Department
Admission: EM | Admit: 2018-03-02 | Discharge: 2018-03-02 | Disposition: A | Discharge status: Home / Self Care | Payer: Medicare Other | Source: Home / Self Care | Attending: Emergency Medicine | Admitting: Emergency Medicine

## 2018-03-02 ENCOUNTER — Other Ambulatory Visit: Payer: Self-pay

## 2018-03-02 ENCOUNTER — Encounter: Payer: Self-pay | Admitting: Emergency Medicine

## 2018-03-02 ENCOUNTER — Ambulatory Visit: Payer: Self-pay

## 2018-03-02 DIAGNOSIS — H269 Unspecified cataract: Secondary | ICD-10-CM | POA: Diagnosis not present

## 2018-03-02 DIAGNOSIS — Z5309 Procedure and treatment not carried out because of other contraindication: Secondary | ICD-10-CM | POA: Diagnosis not present

## 2018-03-02 DIAGNOSIS — Z9221 Personal history of antineoplastic chemotherapy: Secondary | ICD-10-CM | POA: Insufficient documentation

## 2018-03-02 DIAGNOSIS — I10 Essential (primary) hypertension: Secondary | ICD-10-CM | POA: Insufficient documentation

## 2018-03-02 DIAGNOSIS — R55 Syncope and collapse: Secondary | ICD-10-CM

## 2018-03-02 DIAGNOSIS — J449 Chronic obstructive pulmonary disease, unspecified: Secondary | ICD-10-CM | POA: Insufficient documentation

## 2018-03-02 DIAGNOSIS — Z7982 Long term (current) use of aspirin: Secondary | ICD-10-CM | POA: Insufficient documentation

## 2018-03-02 DIAGNOSIS — Z87891 Personal history of nicotine dependence: Secondary | ICD-10-CM | POA: Insufficient documentation

## 2018-03-02 DIAGNOSIS — Z8521 Personal history of malignant neoplasm of larynx: Secondary | ICD-10-CM | POA: Insufficient documentation

## 2018-03-02 DIAGNOSIS — Z79899 Other long term (current) drug therapy: Secondary | ICD-10-CM | POA: Insufficient documentation

## 2018-03-02 DIAGNOSIS — Z8589 Personal history of malignant neoplasm of other organs and systems: Secondary | ICD-10-CM | POA: Diagnosis not present

## 2018-03-02 DIAGNOSIS — R42 Dizziness and giddiness: Secondary | ICD-10-CM | POA: Diagnosis not present

## 2018-03-02 DIAGNOSIS — R001 Bradycardia, unspecified: Secondary | ICD-10-CM | POA: Diagnosis not present

## 2018-03-02 LAB — TROPONIN I

## 2018-03-02 LAB — BASIC METABOLIC PANEL
ANION GAP: 8 (ref 5–15)
ANION GAP: 8 (ref 5–15)
BUN: 16 mg/dL (ref 6–20)
BUN: 13 mg/dL (ref 6–20)
CALCIUM: 9.4 mg/dL (ref 8.9–10.3)
CHLORIDE: 103 mmol/L (ref 101–111)
CO2: 26 mmol/L (ref 22–32)
CO2: 26 mmol/L (ref 22–32)
Calcium: 9 mg/dL (ref 8.9–10.3)
Chloride: 105 mmol/L (ref 101–111)
Creatinine, Ser: 0.84 mg/dL (ref 0.44–1.00)
Creatinine, Ser: 0.8 mg/dL (ref 0.44–1.00)
GFR calc non Af Amer: >60 mL/min (ref >60)
Glucose, Bld: 98 mg/dL (ref 65–99)
Glucose, Bld: 129 mg/dL — ABNORMAL HIGH (ref 65–99)
POTASSIUM: 4.1 mmol/L (ref 3.5–5.1)
POTASSIUM: 3.8 mmol/L (ref 3.5–5.1)
SODIUM: 137 mmol/L (ref 135–145)
Sodium: 139 mmol/L (ref 135–145)

## 2018-03-02 LAB — CBC
HEMATOCRIT: 37.8 % (ref 35.0–47.0)
HEMATOCRIT: 37.8 % (ref 35.0–47.0)
HEMOGLOBIN: 12.6 g/dL (ref 12.0–16.0)
HEMOGLOBIN: 12.8 g/dL (ref 12.0–16.0)
MCH: 29.6 pg (ref 26.0–34.0)
MCH: 30.3 pg (ref 26.0–34.0)
MCHC: 33.3 g/dL (ref 32.0–36.0)
MCHC: 33.9 g/dL (ref 32.0–36.0)
MCV: 88.7 fL (ref 80.0–100.0)
MCV: 89.2 fL (ref 80.0–100.0)
Platelets: 202 10*3/uL (ref 150–440)
Platelets: 232 10*3/uL (ref 150–440)
RBC: 4.27 MIL/uL (ref 3.80–5.20)
RBC: 4.23 MIL/uL (ref 3.80–5.20)
RDW: 13.5 % (ref 11.5–14.5)
RDW: 13.6 % (ref 11.5–14.5)
WBC: 4.1 10*3/uL (ref 3.6–11.0)
WBC: 5.4 10*3/uL (ref 3.6–11.0)

## 2018-03-02 LAB — MAGNESIUM: MAGNESIUM: 2.1 mg/dL (ref 1.7–2.4)

## 2018-03-02 LAB — GLUCOSE, CAPILLARY: Glucose-Capillary: 89 mg/dL (ref 65–99)

## 2018-03-02 SURGERY — PHACOEMULSIFICATION, CATARACT, WITH IOL INSERTION
Anesthesia: Topical | Laterality: Left

## 2018-03-02 MED ORDER — MOXIFLOXACIN HCL 0.5 % OP SOLN
1.0000 [drp] | OPHTHALMIC | Status: DC | PRN
  Administered 2018-03-02 (×2): 1 [drp] via OPHTHALMIC

## 2018-03-02 MED ORDER — ARMC OPHTHALMIC DILATING DROPS
1.0000 "application " | OPHTHALMIC | Status: DC | PRN
  Administered 2018-03-02: 1 via OPHTHALMIC

## 2018-03-02 MED ORDER — GLYCOPYRROLATE 0.2 MG/ML IJ SOLN
INTRAMUSCULAR | Status: DC | PRN
  Administered 2018-03-02: 0.2 mg via INTRAVENOUS

## 2018-03-02 MED ORDER — LACTATED RINGERS IV SOLN
INTRAVENOUS | Status: DC
  Administered 2018-03-02: 08:00:00 via INTRAVENOUS

## 2018-03-02 MED ORDER — EPHEDRINE SULFATE 50 MG/ML IJ SOLN
INTRAMUSCULAR | Status: DC | PRN
  Administered 2018-03-02: 20 mg via INTRAVENOUS

## 2018-03-02 SURGICAL SUPPLY — 24 items
CANNULA ANT/CHMB 27GA (MISCELLANEOUS) ×3 IMPLANT
CARTRIDGE ABBOTT (MISCELLANEOUS) IMPLANT
GLOVE SURG LX 7.5 STRW (GLOVE) ×2
GLOVE SURG LX STRL 7.5 STRW (GLOVE) ×1 IMPLANT
GLOVE SURG TRIUMPH 8.0 PF LTX (GLOVE) ×3 IMPLANT
GOWN STRL REUS W/ TWL LRG LVL3 (GOWN DISPOSABLE) ×2 IMPLANT
GOWN STRL REUS W/TWL LRG LVL3 (GOWN DISPOSABLE) ×4
MARKER SKIN DUAL TIP RULER LAB (MISCELLANEOUS) ×3 IMPLANT
NDL RETROBULBAR .5 NSTRL (NEEDLE) IMPLANT
NEEDLE FILTER BLUNT 18X 1/2SAF (NEEDLE) ×2
NEEDLE FILTER BLUNT 18X1 1/2 (NEEDLE) ×1 IMPLANT
PACK CATARACT BRASINGTON (MISCELLANEOUS) ×3 IMPLANT
PACK EYE AFTER SURG (MISCELLANEOUS) ×3 IMPLANT
PACK OPTHALMIC (MISCELLANEOUS) ×3 IMPLANT
RING MALYGIN 7.0 (MISCELLANEOUS) IMPLANT
SUT ETHILON 10-0 CS-B-6CS-B-6 (SUTURE)
SUT VICRYL  9 0 (SUTURE)
SUT VICRYL 9 0 (SUTURE) IMPLANT
SUTURE EHLN 10-0 CS-B-6CS-B-6 (SUTURE) IMPLANT
SYR 3ML LL SCALE MARK (SYRINGE) ×3 IMPLANT
SYR 5ML LL (SYRINGE) ×3 IMPLANT
SYR TB 1ML LUER SLIP (SYRINGE) ×3 IMPLANT
WATER STERILE IRR 500ML POUR (IV SOLUTION) ×3 IMPLANT
WIPE NON LINTING 3.25X3.25 (MISCELLANEOUS) ×3 IMPLANT

## 2018-03-02 NOTE — ED Triage Notes (Signed)
Pt states she was feeling really bad at surgery center this am, hr dropped in the 30's. Pt started feeling better after iv fluids. NAD at this time.

## 2018-03-02 NOTE — Telephone Encounter (Signed)
  Pt. Reports she was going to have cataract surgery this morning and it was cancelled due to low BP. Note in chart states pt. Had vasovagal reaction with heart rate dropping to 30. Pt. States BP when she left was 100/75. Reports she "feels fine.I was nervous about the surgery."  Answer Assessment - Initial Assessment Questions 1. BLOOD PRESSURE: "What is the blood pressure?" "Did you take at least two measurements 5 minutes apart?"     100/75 2. ONSET: "When did you take your blood pressure?"     Taken when she left from surgery center  3. HOW: "How did you obtain the blood pressure?" (e.g., visiting nurse, automatic home BP monitor)     Nurse 4. HISTORY: "Do you have a history of low blood pressure?" "What is your blood pressure normally?"     No 5. MEDICATIONS: "Are you taking any medications for blood pressure?" If yes: "Have they been changed recently?"     No 6. PULSE RATE: "Do you know what your pulse rate is?"      No 7. OTHER SYMPTOMS: "Have you been sick recently?" "Have you had a recent injury?"     No 8. PREGNANCY: "Is there any chance you are pregnant?" "When was your last menstrual period?"     No  Protocols used: LOW BLOOD PRESSURE-A-AH

## 2018-03-02 NOTE — Telephone Encounter (Signed)
Called patient, no answer, left a message letting patient know that she needs to go to the Emergency Room since she did pass out.

## 2018-03-02 NOTE — ED Provider Notes (Signed)
[facility] Emergency Department Provider Note ____________________________________________   First MD Initiated Contact with Patient 03/02/18 1416     (approximate)  I have reviewed the triage vital signs and the nursing notes.   HISTORY  Chief Complaint Hypotension    HPI Susan Houston is a 57 y.o. female with PMH as noted below who presents with lightheadedness and near syncope, acute onset approximately 6 hours ago when the patient was in preop for cataract surgery, associated with low heart rate to the 30s as well as some shortness of breath, and now resolved after she got IV fluids.  Patient states that she went home, and then called her doctor who instructed her to come to the emergency department for evaluation.  She denies associated chest pain, and denies any recent illness.  Past Medical History:  Diagnosis Date  . Anxiety   . Arthritis   . Asthma   . Back pain   . Bruises easily   . Cancer ([place]) 2009   LARYNX CANCER - CHEMO / RADIATION (NO SURGERY)   . Chronic leg pain    BILATERAL  . COPD (chronic obstructive pulmonary disease) ([place])   . Depression   . Difficulty sleeping   . Diverticulitis   . Hemorrhoids   . History of kidney stones   . Hyperlipidemia   . Hypertension   . Incontinence of urine   . Migraine   . MRSA infection greater than 3 months ago    2008 right side of face  . Nerve damage    right leg    Patient Active Problem List   Diagnosis Date Noted  . Vitamin B12 deficiency 12/15/2017  . Frequent falls 11/23/2017  . Mild cognitive impairment 11/23/2017  . Migraine without aura and without status migrainosus, not intractable 09/11/2016  . Hyperlipidemia   . COPD (chronic obstructive pulmonary disease) ([place])   . Chronic pain 12/24/2015  . Severe depression ([place]) 12/24/2015  . Chronic anxiety 12/24/2015  . Coarse tremors 12/24/2015  . Facet syndrome, lumbar 03/28/2015  . DDD (degenerative disc disease), lumbar  03/05/2015  . DDD (degenerative disc disease), lumbosacral 02/23/2015  . Sacroiliac joint dysfunction 02/23/2015  . Saphenous neuralgia 09/24/2014    Past Surgical History:  Procedure Laterality Date  . ABDOMINAL HYSTERECTOMY  1983  . BLADDER SUSPENSION  2014  . CHOLECYSTECTOMY    . COLONOSCOPY    . ESOPHAGOGASTRODUODENOSCOPY    . ESOPHAGOGASTRODUODENOSCOPY (EGD) WITH PROPOFOL N/A 11/20/2016   Procedure: ESOPHAGOGASTRODUODENOSCOPY (EGD) WITH PROPOFOL;  Surgeon: Jonathon Bellows, MD;  Location: [facility] ENDOSCOPY;  Service: Endoscopy;  Laterality: N/A;  . EXCISION NEUROMA Bilateral 09/24/2014   Procedure: BILATERAL OPEN SAPHENOUS NEURECTOMIES AND OPEN LEFT PERIPATELLA OSETOPHYTECTOMY;  Surgeon: Mauri Pole, MD;  Location: [facility];  Service: Orthopedics;  Laterality: Bilateral;  . JOINT REPLACEMENT  2009/2010   BIL TOTAL KNEES  . TONSILLECTOMY      Prior to Admission medications   Medication Sig Start Date End Date Taking? Authorizing Provider  aspirin 81 MG tablet Take 81 mg by mouth daily.    [provider]  busPIRone (BUSPAR) 15 MG tablet Take 15 mg by mouth daily. 07/23/16   [provider]  doxepin (SINEQUAN) 25 MG capsule TAKE ONE CAPSULE BY MOUTH AT BEDTIME 01/20/18   Ursula Alert, MD  gabapentin (NEURONTIN) 300 MG capsule Take 300 mg by mouth 3 (three) times daily.    [provider]  LORazepam (ATIVAN) 0.5 MG tablet Take 0.5-1 tablets (0.25-0.5  mg total) by mouth daily. Take 0.25 mg in the AM and 0.5 mg po qhs 01/06/18   Ursula Alert, MD  mirabegron ER (MYRBETRIQ) 50 MG TB24 tablet Take 1 tablet (50 mg total) by mouth daily. 02/28/18   Bjorn Loser, MD  omeprazole (PRILOSEC) 40 MG capsule Take 1 capsule (40 mg total) by mouth daily. 11/11/16 03/02/18  Jonathon Bellows, MD  ondansetron (ZOFRAN-ODT) 4 MG disintegrating tablet DISSOLVE 1 TABLET(4 MG) ON THE TONGUE EVERY 12 HOURS AS NEEDED FOR NAUSEA 12/21/17   [provider]  SUMAtriptan (IMITREX) 100 MG  tablet TAKE 1 TAB BY MOUTH AT START OF MIGRAINE. MAY REPEAT IN 2 HOURS IF HEADACHE PERSISTS OR RECURS. MAX: 2 TABS IN 24 HRS. 05/28/17   Melvenia Beam, MD  traZODone (DESYREL) 100 MG tablet TAKE 1 AND 1/2 TABLETS(150 MG) BY MOUTH AT BEDTIME 01/06/18   Ursula Alert, MD  varenicline (CHANTIX CONTINUING MONTH PAK) 1 MG tablet Take 1 tablet (1 mg total) by mouth 2 (two) times daily. 01/05/18   Johnson, Megan P, DO  vitamin B-12 (CYANOCOBALAMIN) 1000 MCG tablet Take 1,000 mcg by mouth daily.    [provider]    Allergies Lexapro [escitalopram oxalate] and Morphine and related  Family History  Problem Relation Age of Onset  . Asthma Mother   . Diabetes Mother   . Hyperlipidemia Mother   . Hypertension Mother   . Cirrhosis Mother        Non-alcoholic  . Arthritis Father   . Cancer Father        Bone  . Hyperlipidemia Father   . Hypertension Father   . Anxiety disorder Sister   . Depression Sister   . Kidney Stones Son   . Stroke Maternal Grandmother   . Breast cancer Maternal Grandmother 71  . Cancer Maternal Grandfather        lung  . Pneumonia Paternal Grandmother   . Alzheimer's disease Paternal Grandfather   . Diabetes Sister   . Gout Sister   . Hypertension Sister   . Alcohol abuse Brother   . Heart disease Brother        massive MI    Social History Social History   Tobacco Use  . Smoking status: Former Smoker    Packs/day: 0.50    Types: Cigarettes    Last attempt to quit: 04/07/2016    Years since quitting: 1.9  . Smokeless tobacco: Never Used  . Tobacco comment: patient has not smoked x 10 days.06/28/17 chantix  Substance Use Topics  . Alcohol use: No    Alcohol/week: 0.0 oz  . Drug use: No    Review of Systems  Constitutional: No fever. Eyes: No visual changes. ENT: No neck pain. Cardiovascular: Denies chest pain. Respiratory: Positive for resolved shortness of breath. Gastrointestinal: No vomiting.  Genitourinary: Negative for dysuria.    Musculoskeletal: Negative for back pain. Skin: Negative for rash. Neurological: Negative for headache.   ____________________________________________   PHYSICAL EXAM:  VITAL SIGNS: ED Triage Vitals [03/02/18 1207]  Enc Vitals Group     BP (!) 147/70     Pulse Rate 89     Resp 18     Temp 98.2 F (36.8 C)     Temp Source Oral     SpO2 99 %     Weight 163 lb (73.9 kg)     Height 5\' 2"  (1.575 m)     Head Circumference      Peak Flow  Pain Score 0     Pain Loc      Pain Edu?      Excl. in [place]?     Constitutional: Alert and oriented. Well appearing and in no acute distress. Eyes: Conjunctivae are normal.  Head: Atraumatic. Nose: No congestion/rhinnorhea. Mouth/Throat: Mucous membranes are slightly dry.   Neck: Normal range of motion.  Cardiovascular: Normal rate, regular rhythm. Grossly normal heart sounds.  Good peripheral circulation. Respiratory: Normal respiratory effort.  No retractions. Lungs CTAB. Gastrointestinal:  No distention.  Musculoskeletal:  Extremities warm and well perfused.  Neurologic:  Normal speech and language. No gross focal neurologic deficits are appreciated.  Skin:  Skin is warm and dry. No rash noted. Psychiatric: Mood and affect are normal. Speech and behavior are normal.  ____________________________________________   LABS (all labs ordered are listed, but only abnormal results are displayed)  Labs Reviewed  BASIC METABOLIC PANEL - Abnormal; Notable for the following components:      Result Value   Glucose, Bld 129 (*)    All other components within normal limits  CBC  TROPONIN I   ____________________________________________  EKG  ED ECG REPORT I, Arta Silence, the attending physician, personally viewed and interpreted this ECG.  Date: 03/02/2018 EKG Time: 1224 Rate: 93 Rhythm: normal sinus rhythm QRS Axis: normal Intervals: normal ST/T Wave abnormalities: normal Narrative Interpretation: no evidence of acute  ischemia  ____________________________________________  RADIOLOGY    ____________________________________________   PROCEDURES  Procedure(s) performed: No  Procedures  Critical Care performed: No ____________________________________________   INITIAL IMPRESSION / ASSESSMENT AND PLAN / ED COURSE  Pertinent labs & imaging results that were available during my care of the patient were reviewed by me and considered in my medical decision making (see chart for details).  57 year old female with PMH as noted above presents with lightheadedness, near syncope, bradycardia and apparent hypotension while in preop for cataract surgery.  Patient states that she was n.p.o. after midnight last night but did drink a little bit of water this morning.  She states that since then, her symptoms have completely resolved.  On exam, the patient is relatively well-appearing, vital signs are normal, and the remainder of the exam is unremarkable.  Overall presentation is consistent with vasovagal near syncope, likely due to decreased p.o. intake and possible anxiety before her procedure.  Basic labs and EKG ordered from triage are normal.  I will add on a troponin with no indication for repeat given that it has been more than 6 hours since the episode.     ----------------------------------------- 6:22 PM on 03/02/2018 -----------------------------------------  Negative troponin.  Patient remains asymptomatic.  She feels well to go home.  She agrees to follow-up with her primary care doctor.  Return precautions given, and she expresses understanding.  ____________________________________________   FINAL CLINICAL IMPRESSION(S) / ED DIAGNOSES  Final diagnoses:  Near syncope      NEW MEDICATIONS STARTED DURING THIS VISIT:  Discharge Medication List as of 03/02/2018  4:19 PM       Note:  This document was prepared using Dragon voice recognition software and may include unintentional  dictation errors.    Arta Silence, MD 03/02/18 [place]

## 2018-03-02 NOTE — Anesthesia Preprocedure Evaluation (Addendum)
Anesthesia Evaluation  Patient identified by MRN, date of birth, ID band Patient awake    Reviewed: Allergy & Precautions, NPO status , Patient's Chart, lab work & pertinent test results  History of Anesthesia Complications Negative for: history of anesthetic complications  Airway Mallampati: II  TM Distance: >3 FB Neck ROM: full    Dental  (+) Edentulous Upper   Pulmonary asthma , COPD, former smoker,  New diagnosis of lung ca in right lower lobe.  Pulmonary symptoms stable.    Pulmonary exam normal breath sounds clear to auscultation       Cardiovascular hypertension, Normal cardiovascular exam Rhythm:Regular Rate:Normal     Neuro/Psych  Headaches, PSYCHIATRIC DISORDERS Anxiety Depression    GI/Hepatic negative GI ROS,   Endo/Other  negative endocrine ROS  Renal/GU Renal disease (hx nephrolithiasis)     Musculoskeletal   Abdominal   Peds  Hematology negative hematology ROS (+) Laryngeal CA s/p chemo and radiation   Anesthesia Other Findings No issues with HR or BP today.    Reproductive/Obstetrics                          Anesthesia Physical Anesthesia Plan  ASA: III  Anesthesia Plan: MAC   Post-op Pain Management:    Induction:   PONV Risk Score and Plan:   Airway Management Planned:   Additional Equipment:   Intra-op Plan:   Post-operative Plan:   Informed Consent: I have reviewed the patients History and Physical, chart, labs and discussed the procedure including the risks, benefits and alternatives for the proposed anesthesia with the patient or authorized representative who has indicated his/her understanding and acceptance.     Plan Discussed with:   Anesthesia Plan Comments: (Pt hypotensive to SBP 50s and bradycardic to HR 30s in pre-op.  Made pt supine, placed supplemental O2 via facemask, bolused IV fluids, and gave ephedrine 54m and glycopyrrolate 0.232mwith  resolution over a matter of minutes.   Fingerstick glucose 89.  12-lead ECG normal.  Was over 10 minutes after IV placement, but suspect vagal reaction to stress, worsened by dehydration.  Decision made to cancel case for further workup; discussed with Dr. BrWallace Goingpatient, and husband.  Drew labs and recommended pt go to PCP for cardiac workup and clearance.  For future cases, pt would likely benefit from oral hydration prior to arrival.  )       Anesthesia Quick Evaluation

## 2018-03-02 NOTE — Telephone Encounter (Signed)
Patient notified, she states that she she will get her husband to come and take her to the ER

## 2018-03-02 NOTE — ED Triage Notes (Signed)
Pt states she was to have surgery this am, sent her here for low hr, low bp. Upon arrival to ED, hr 89, blood pressure 147/70.  Denies pain.

## 2018-03-02 NOTE — Discharge Instructions (Addendum)
Return to the ER for new, worsening, persistent weakness, lightheadedness, difficulty breathing, or any other new or worsening symptoms that concern you.  Follow-up with your regular doctor.

## 2018-03-02 NOTE — Progress Notes (Signed)
Case cancelled per anesthesia.

## 2018-03-02 NOTE — ED Notes (Signed)
Topaz not functioning.

## 2018-03-02 NOTE — Telephone Encounter (Signed)
FYI

## 2018-03-02 NOTE — Progress Notes (Addendum)
At approx 0828 patient verbalized she felt like she was going to pass out. Patient hooked up to EKG monitor, noted SB, HR in the 30s on the monitor. BP 55/19. Dr. Erenest Rasher, anesthesia, called to the room. Verbal order received for LR and oxygen; pt placed on 6L via face mask and LR was started. Blood sugar 89. Glyco & ephedrine given by Dr. Erenest Rasher. Pt remained conscious during this event. BP currently 135/61, HR 69 (NSR). 12-lead EKG performed and read by anesthesia. Patient reports she is feeling "fine" now.

## 2018-03-02 NOTE — Telephone Encounter (Signed)
NEEDS APPOINTMENT ASAP

## 2018-03-02 NOTE — H&P (Signed)
The History and Physical notes are on paper, have been signed, and are to be scanned. The patient remains stable and unchanged from the H&P.   Previous H&P reviewed, patient examined, and there are no changes. The patient experienced a vaso-vagal reaction in the pre op area.  HR decreased to 30. Anesthesia attended, and after several minutes of treatment, the patient responded normally.  VS stable and patient alert.    Johnattan Strassman 03/02/2018 8:57 AM

## 2018-03-03 ENCOUNTER — Ambulatory Visit: Payer: Medicare Other | Admitting: Unknown Physician Specialty

## 2018-03-03 ENCOUNTER — Encounter: Payer: Self-pay | Admitting: Unknown Physician Specialty

## 2018-03-03 VITALS — BP 140/86 | HR 73 | Temp 98.4°F | Ht 62.0 in | Wt 193.0 lb

## 2018-03-03 DIAGNOSIS — R001 Bradycardia, unspecified: Secondary | ICD-10-CM

## 2018-03-03 DIAGNOSIS — R55 Syncope and collapse: Secondary | ICD-10-CM | POA: Diagnosis not present

## 2018-03-03 NOTE — Progress Notes (Signed)
BP 140/86   Pulse 73   Temp 98.4 F (36.9 C) (Oral)   Ht 5\' 2"  (1.575 m)   Wt 193 lb (87.5 kg)   SpO2 98%   BMI 35.30 kg/m    Subjective:    Patient ID: Susan Houston, female    DOB: [birth year], 57 y.o.   MRN: [id number]  HPI: Susan Houston is a 57 y.o. female  Chief Complaint  Patient presents with  . ER Follow Up   Pt was in preop yesterday for cataract surgery and experienced dizziness, SOB, and bradycardia (30's).  This was resolved after IV fluids.  She was followed in the ER and was asymptomatic.  Her EKG was normal.  ER diagnosed her with vaso-vagal reaction due to dehydration or anxiety.    Pt states she feels she was nervous at the time and happened following pupil dilation.    Social History   Socioeconomic History  . Marital status: Married    Spouse name: Tharon Aquas  . Number of children: 2  . Years of education: 54  . Highest education level: 12th grade  Occupational History  . Occupation: Disabled  Social Needs  . Financial resource strain: Not hard at all  . Food insecurity:    Worry: Never true    Inability: Never true  . Transportation needs:    Medical: No    Non-medical: No  Tobacco Use  . Smoking status: Former Smoker    Packs/day: 0.50    Types: Cigarettes    Last attempt to quit: 04/07/2016    Years since quitting: 1.9  . Smokeless tobacco: Never Used  . Tobacco comment: patient has not smoked x 10 days.06/28/17 chantix  Substance and Sexual Activity  . Alcohol use: No    Alcohol/week: 0.0 oz  . Drug use: No  . Sexual activity: Never  Lifestyle  . Physical activity:    Days per week: 0 days    Minutes per session: 0 min  . Stress: Not at all  Relationships  . Social connections:    Talks on phone: Never    Gets together: Twice a week    Attends religious service: More than 4 times per year    Active member of club or organization: No    Attends meetings of clubs or organizations: Never    Relationship status: Married  . Intimate  partner violence:    Fear of current or ex partner: No    Emotionally abused: No    Physically abused: No    Forced sexual activity: No  Other Topics Concern  . Not on file  Social History Narrative   Lives at home w/ her husband   Right-handed   Caffeine: occasionally     Relevant past medical, surgical, family and social history reviewed and updated as indicated. Interim medical history since our last visit reviewed. Allergies and medications reviewed and updated.  Review of Systems  Constitutional: Negative.   Respiratory: Negative.   Cardiovascular: Negative.   Psychiatric/Behavioral: Negative.     Per HPI unless specifically indicated above     Objective:    BP 140/86   Pulse 73   Temp 98.4 F (36.9 C) (Oral)   Ht 5\' 2"  (1.575 m)   Wt 193 lb (87.5 kg)   SpO2 98%   BMI 35.30 kg/m   Wt Readings from Last 3 Encounters:  03/03/18 193 lb (87.5 kg)  03/02/18 163 lb (73.9 kg)  03/02/18 192 lb (87.1 kg)  Physical Exam  Constitutional: She is oriented to person, place, and time. She appears well-developed and well-nourished. No distress.  HENT:  Head: Normocephalic and atraumatic.  Eyes: Conjunctivae and lids are normal. Right eye exhibits no discharge. Left eye exhibits no discharge. No scleral icterus.  Neck: Normal range of motion. Neck supple. No JVD present. Carotid bruit is not present.  Cardiovascular: Normal rate, regular rhythm and normal heart sounds.  Pulmonary/Chest: Effort normal and breath sounds normal.  Abdominal: Normal appearance. There is no splenomegaly or hepatomegaly.  Musculoskeletal: Normal range of motion.  Neurological: She is alert and oriented to person, place, and time.  Skin: Skin is warm, dry and intact. No rash noted. No pallor.  Psychiatric: She has a normal mood and affect. Her behavior is normal. Judgment and thought content normal.    Results for orders placed or performed during the hospital encounter of 81/77/11  Basic  metabolic panel  Result Value Ref Range   Sodium 139 135 - 145 mmol/L   Potassium 3.8 3.5 - 5.1 mmol/L   Chloride 105 101 - 111 mmol/L   CO2 26 22 - 32 mmol/L   Glucose, Bld 129 (H) 65 - 99 mg/dL   BUN 13 6 - 20 mg/dL   Creatinine, Ser 0.80 0.44 - 1.00 mg/dL   Calcium 9.4 8.9 - 10.3 mg/dL   GFR calc non Af Amer >60 >60 mL/min   GFR calc Af Amer >60 >60 mL/min   Anion gap 8 5 - 15  CBC  Result Value Ref Range   WBC 5.4 3.6 - 11.0 K/uL   RBC 4.23 3.80 - 5.20 MIL/uL   Hemoglobin 12.8 12.0 - 16.0 g/dL   HCT 37.8 35.0 - 47.0 %   MCV 89.2 80.0 - 100.0 fL   MCH 30.3 26.0 - 34.0 pg   MCHC 33.9 32.0 - 36.0 g/dL   RDW 13.6 11.5 - 14.5 %   Platelets 232 150 - 440 K/uL  Troponin I  Result Value Ref Range   Troponin I <0.03 <0.03 ng/mL      Assessment & Plan:   Problem List Items Addressed This Visit    None    Visit Diagnoses    Bradycardia    -  Primary   Before eye surgery.  Chart reviewed.  Labs and EKG were normal.  Will refer to cardiology for further evaluation before again scheduling eye surgery     Relevant Orders   Ambulatory referral to Cardiology   Pre-syncope       F/U with cardiology.     Relevant Orders   Ambulatory referral to Cardiology       Follow up plan: With cardiology

## 2018-03-16 DIAGNOSIS — Z01818 Encounter for other preprocedural examination: Secondary | ICD-10-CM | POA: Diagnosis not present

## 2018-03-16 DIAGNOSIS — R0602 Shortness of breath: Secondary | ICD-10-CM | POA: Diagnosis not present

## 2018-03-16 DIAGNOSIS — I208 Other forms of angina pectoris: Secondary | ICD-10-CM | POA: Diagnosis not present

## 2018-03-16 DIAGNOSIS — R001 Bradycardia, unspecified: Secondary | ICD-10-CM | POA: Diagnosis not present

## 2018-03-17 ENCOUNTER — Encounter: Payer: Self-pay | Admitting: Unknown Physician Specialty

## 2018-03-17 ENCOUNTER — Ambulatory Visit (INDEPENDENT_AMBULATORY_CARE_PROVIDER_SITE_OTHER): Payer: Medicare Other | Admitting: Unknown Physician Specialty

## 2018-03-17 VITALS — BP 139/92 | HR 76 | Temp 98.1°F | Wt 193.4 lb

## 2018-03-17 DIAGNOSIS — Z8614 Personal history of Methicillin resistant Staphylococcus aureus infection: Secondary | ICD-10-CM | POA: Diagnosis not present

## 2018-03-17 DIAGNOSIS — B029 Zoster without complications: Secondary | ICD-10-CM

## 2018-03-17 MED ORDER — SULFAMETHOXAZOLE-TRIMETHOPRIM 800-160 MG PO TABS: 1.0000 | ORAL_TABLET | Freq: Two times a day (BID) | ORAL | 0 refills | Status: DC

## 2018-03-17 MED ORDER — VALACYCLOVIR HCL 1 G PO TABS: 1000.0000 mg | ORAL_TABLET | Freq: Three times a day (TID) | ORAL | 0 refills | Status: DC

## 2018-03-17 MED ORDER — HYDROCODONE-ACETAMINOPHEN 5-325 MG PO TABS: 1.0000 | ORAL_TABLET | Freq: Four times a day (QID) | ORAL | 0 refills | Status: DC | PRN

## 2018-03-17 NOTE — Patient Instructions (Signed)
Shingles Shingles, which is also known as herpes zoster, is an infection that causes a painful skin rash and fluid-filled blisters. Shingles is not related to genital herpes, which is a sexually transmitted infection. Shingles only develops in people who:  Have had chickenpox.  Have received the chickenpox vaccine. (This is rare.)  What are the causes? Shingles is caused by varicella-zoster virus (VZV). This is the same virus that causes chickenpox. After exposure to VZV, the virus stays in the body in an inactive (dormant) state. Shingles develops if the virus reactivates. This can happen many years after the initial exposure to VZV. It is not known what causes this virus to reactivate. What increases the risk? People who have had chickenpox or received the chickenpox vaccine are at risk for shingles. Infection is more common in people who:  Are older than age 50.  Have a weakened defense (immune) system, such as those with HIV, AIDS, or cancer.  Are taking medicines that weaken the immune system, such as transplant medicines.  Are under great stress.  What are the signs or symptoms? Early symptoms of this condition include itching, tingling, and pain in an area on your skin. Pain may be described as burning, stabbing, or throbbing. A few days or weeks after symptoms start, a painful red rash appears, usually on one side of the body in a bandlike or beltlike pattern. The rash eventually turns into fluid-filled blisters that break open, scab over, and dry up in about 2-3 weeks. At any time during the infection, you may also develop:  A fever.  Chills.  A headache.  An upset stomach.  How is this diagnosed? This condition is diagnosed with a skin exam. Sometimes, skin or fluid samples are taken from the blisters before a diagnosis is made. These samples are examined under a microscope or sent to a lab for testing. How is this treated? There is no specific cure for this condition.  Your health care provider will probably prescribe medicines to help you manage pain, recover more quickly, and avoid long-term problems. Medicines may include:  Antiviral drugs.  Anti-inflammatory drugs.  Pain medicines.  If the area involved is on your face, you may be referred to a specialist, such as an eye doctor (ophthalmologist) or an ear, nose, and throat (ENT) doctor to help you avoid eye problems, chronic pain, or disability. Follow these instructions at home: Medicines  Take medicines only as directed by your health care provider.  Apply an anti-itch or numbing cream to the affected area as directed by your health care provider. Blister and Rash Care  Take a cool bath or apply cool compresses to the area of the rash or blisters as directed by your health care provider. This may help with pain and itching.  Keep your rash covered with a loose bandage (dressing). Wear loose-fitting clothing to help ease the pain of material rubbing against the rash.  Keep your rash and blisters clean with mild soap and cool water or as directed by your health care provider.  Check your rash every day for signs of infection. These include redness, swelling, and pain that lasts or increases.  Do not pick your blisters.  Do not scratch your rash. General instructions  Rest as directed by your health care provider.  Keep all follow-up visits as directed by your health care provider. This is important.  Until your blisters scab over, your infection can cause chickenpox in people who have never had it or been vaccinated   against it. To prevent this from happening, avoid contact with other people, especially: ? Babies. ? Pregnant women. ? Children who have eczema. ? Elderly people who have transplants. ? People who have chronic illnesses, such as leukemia or AIDS. Contact a health care provider if:  Your pain is not relieved with prescribed medicines.  Your pain does not get better after  the rash heals.  Your rash looks infected. Signs of infection include redness, swelling, and pain that lasts or increases. Get help right away if:  The rash is on your face or nose.  You have facial pain, pain around your eye area, or loss of feeling on one side of your face.  You have ear pain or you have ringing in your ear.  You have loss of taste.  Your condition gets worse. This information is not intended to replace advice given to you by your health care provider. Make sure you discuss any questions you have with your health care provider. Document Released: 10/05/2005 Document Revised: 05/31/2016 Document Reviewed: 08/16/2014 Elsevier Interactive Patient Education  2018 [employer]  

## 2018-03-17 NOTE — Progress Notes (Signed)
BP (!) 139/92 (BP Location: Left Arm, Patient Position: Sitting, Cuff Size: Normal)   Pulse 76   Temp 98.1 F (36.7 C) (Oral)   Wt 193 lb 6.4 oz (87.7 kg)   SpO2 97%   BMI 35.37 kg/m    Subjective:    Patient ID: Susan Houston, female    DOB: Aug 08, 1961, 57 y.o.   MRN: [id number]  HPI: Susan Houston is a 57 y.o. female  Chief Complaint  Patient presents with  . Facial Swelling    Since Sunday. Patient states she got bit by a brown recluse many years ago, and it became MRSA.  . Facial Pain  . Facial Redness   Pt is here for complaints of right sided chin pain and a lesion.  This is roughly the location where she got bit by a brown recluse and admitted for a MRSA infection.  This lesion started Sunday with erythema and itching.  Now it hurts up to ear and underneath right eye.    Relevant past medical, surgical, family and social history reviewed and updated as indicated. Interim medical history since our last visit reviewed. Allergies and medications reviewed and updated.  Review of Systems  Constitutional: Negative.   Respiratory: Negative.   Cardiovascular: Negative.   Psychiatric/Behavioral: Negative.     Per HPI unless specifically indicated above     Objective:    BP (!) 139/92 (BP Location: Left Arm, Patient Position: Sitting, Cuff Size: Normal)   Pulse 76   Temp 98.1 F (36.7 C) (Oral)   Wt 193 lb 6.4 oz (87.7 kg)   SpO2 97%   BMI 35.37 kg/m   Wt Readings from Last 3 Encounters:  03/17/18 193 lb 6.4 oz (87.7 kg)  03/03/18 193 lb (87.5 kg)  03/02/18 163 lb (73.9 kg)    Physical Exam  Constitutional: She is oriented to person, place, and time. She appears well-developed and well-nourished. No distress.  HENT:  Head: Normocephalic and atraumatic.  Eyes: Conjunctivae and lids are normal. Right eye exhibits no discharge. Left eye exhibits no discharge. No scleral icterus.  Neck: Normal range of motion. Neck supple. No JVD present. Carotid bruit is not present.   Cardiovascular: Normal rate, regular rhythm and normal heart sounds.  Pulmonary/Chest: Effort normal and breath sounds normal.  Abdominal: Normal appearance. There is no splenomegaly or hepatomegaly.  Musculoskeletal: Normal range of motion.  Neurological: She is alert and oriented to person, place, and time.  Skin: Skin is warm, dry and intact. No rash noted. No pallor.  Right chin with grouped blistering lesions with surrounding erythema  Psychiatric: She has a normal mood and affect. Her behavior is normal. Judgment and thought content normal.    Results for orders placed or performed during the hospital encounter of 09/62/83  Basic metabolic panel  Result Value Ref Range   Sodium 139 135 - 145 mmol/L   Potassium 3.8 3.5 - 5.1 mmol/L   Chloride 105 101 - 111 mmol/L   CO2 26 22 - 32 mmol/L   Glucose, Bld 129 (H) 65 - 99 mg/dL   BUN 13 6 - 20 mg/dL   Creatinine, Ser 0.80 0.44 - 1.00 mg/dL   Calcium 9.4 8.9 - 10.3 mg/dL   GFR calc non Af Amer >60 >60 mL/min   GFR calc Af Amer >60 >60 mL/min   Anion gap 8 5 - 15  CBC  Result Value Ref Range   WBC 5.4 3.6 - 11.0 K/uL   RBC  4.23 3.80 - 5.20 MIL/uL   Hemoglobin 12.8 12.0 - 16.0 g/dL   HCT 37.8 35.0 - 47.0 %   MCV 89.2 80.0 - 100.0 fL   MCH 30.3 26.0 - 34.0 pg   MCHC 33.9 32.0 - 36.0 g/dL   RDW 13.6 11.5 - 14.5 %   Platelets 232 150 - 440 K/uL  Troponin I  Result Value Ref Range   Troponin I <0.03 <0.03 ng/mL      Assessment & Plan:   Problem List Items Addressed This Visit    None    Visit Diagnoses    Herpes zoster without complication    -  Primary   Had Dr Wynetta Emery also evaluate.  Valtrex 1 Gm TID for 7 days.  Pt ed on trajectory.  Limited rx for Hydrocodone to help with severe pain   Relevant Medications   valACYclovir (VALTREX) 1000 MG tablet   sulfamethoxazole-trimethoprim (BACTRIM DS,SEPTRA DS) 800-160 MG tablet   History of MRSA infection       due to history, rx for Septra BID for 7 days   Relevant  Medications   valACYclovir (VALTREX) 1000 MG tablet   sulfamethoxazole-trimethoprim (BACTRIM DS,SEPTRA DS) 800-160 MG tablet       Follow up plan: Return if symptoms worsen or fail to improve.

## 2018-03-28 ENCOUNTER — Encounter: Payer: Self-pay | Admitting: Urology

## 2018-03-28 ENCOUNTER — Ambulatory Visit: Payer: Medicare Other | Admitting: Urology

## 2018-04-12 ENCOUNTER — Other Ambulatory Visit: Payer: Self-pay

## 2018-04-12 ENCOUNTER — Emergency Department
Admission: EM | Admit: 2018-04-12 | Discharge: 2018-04-12 | Disposition: A | Discharge status: Home / Self Care | Payer: Medicare Other | Attending: Emergency Medicine | Admitting: Emergency Medicine

## 2018-04-12 ENCOUNTER — Encounter: Payer: Self-pay | Admitting: Emergency Medicine

## 2018-04-12 ENCOUNTER — Emergency Department: Payer: Medicare Other

## 2018-04-12 DIAGNOSIS — Z7982 Long term (current) use of aspirin: Secondary | ICD-10-CM | POA: Diagnosis not present

## 2018-04-12 DIAGNOSIS — K429 Umbilical hernia without obstruction or gangrene: Secondary | ICD-10-CM | POA: Diagnosis not present

## 2018-04-12 DIAGNOSIS — R918 Other nonspecific abnormal finding of lung field: Secondary | ICD-10-CM

## 2018-04-12 DIAGNOSIS — I10 Essential (primary) hypertension: Secondary | ICD-10-CM | POA: Diagnosis not present

## 2018-04-12 DIAGNOSIS — Z87891 Personal history of nicotine dependence: Secondary | ICD-10-CM | POA: Diagnosis not present

## 2018-04-12 DIAGNOSIS — J449 Chronic obstructive pulmonary disease, unspecified: Secondary | ICD-10-CM | POA: Diagnosis not present

## 2018-04-12 DIAGNOSIS — R319 Hematuria, unspecified: Secondary | ICD-10-CM

## 2018-04-12 DIAGNOSIS — K5730 Diverticulosis of large intestine without perforation or abscess without bleeding: Secondary | ICD-10-CM | POA: Diagnosis not present

## 2018-04-12 DIAGNOSIS — R109 Unspecified abdominal pain: Secondary | ICD-10-CM

## 2018-04-12 DIAGNOSIS — Z8521 Personal history of malignant neoplasm of larynx: Secondary | ICD-10-CM | POA: Insufficient documentation

## 2018-04-12 DIAGNOSIS — R1030 Lower abdominal pain, unspecified: Secondary | ICD-10-CM | POA: Diagnosis not present

## 2018-04-12 LAB — URINALYSIS, COMPLETE (UACMP) WITH MICROSCOPIC
BILIRUBIN URINE: NEGATIVE
Bacteria, UA: NONE SEEN
Glucose, UA: NEGATIVE mg/dL
Ketones, ur: NEGATIVE mg/dL
NITRITE: NEGATIVE
PH: 6 (ref 5.0–8.0)
Protein, ur: NEGATIVE mg/dL
SPECIFIC GRAVITY, URINE: 1.001 — AB (ref 1.005–1.030)
SQUAMOUS EPITHELIAL / LPF: NONE SEEN (ref 0–5)

## 2018-04-12 LAB — CBC
HCT: 37.7 % (ref 35.0–47.0)
HEMOGLOBIN: 12.8 g/dL (ref 12.0–16.0)
MCH: 30.2 pg (ref 26.0–34.0)
MCHC: 33.9 g/dL (ref 32.0–36.0)
MCV: 89.2 fL (ref 80.0–100.0)
Platelets: 251 10*3/uL (ref 150–440)
RBC: 4.23 MIL/uL (ref 3.80–5.20)
RDW: 14 % (ref 11.5–14.5)
WBC: 8 10*3/uL (ref 3.6–11.0)

## 2018-04-12 LAB — BASIC METABOLIC PANEL
ANION GAP: 10 (ref 5–15)
BUN: 13 mg/dL (ref 6–20)
CALCIUM: 9.2 mg/dL (ref 8.9–10.3)
CO2: 23 mmol/L (ref 22–32)
CREATININE: 0.84 mg/dL (ref 0.44–1.00)
Chloride: 104 mmol/L (ref 98–111)
GFR calc Af Amer: >60 mL/min (ref >60)
GFR calc non Af Amer: >60 mL/min (ref >60)
GLUCOSE: 97 mg/dL (ref 70–99)
Potassium: 3.3 mmol/L — ABNORMAL LOW (ref 3.5–5.1)
Sodium: 137 mmol/L (ref 135–145)

## 2018-04-12 MED ORDER — ONDANSETRON HCL 4 MG/2ML IJ SOLN
4.0000 mg | Freq: Once | INTRAMUSCULAR | Status: AC
  Administered 2018-04-12: 4 mg via INTRAVENOUS
  Filled 2018-04-12: qty 2

## 2018-04-12 MED ORDER — FENTANYL CITRATE (PF) 100 MCG/2ML IJ SOLN
50.0000 ug | Freq: Once | INTRAMUSCULAR | Status: AC
  Administered 2018-04-12: 50 ug via INTRAVENOUS
  Filled 2018-04-12: qty 2

## 2018-04-12 MED ORDER — KETOROLAC TROMETHAMINE 30 MG/ML IJ SOLN
INTRAMUSCULAR | Status: AC
  Administered 2018-04-12: 15 mg via INTRAVENOUS
  Filled 2018-04-12: qty 1

## 2018-04-12 MED ORDER — HYDROCODONE-ACETAMINOPHEN 5-325 MG PO TABS: 1.0000 | ORAL_TABLET | ORAL | 0 refills | Status: DC | PRN

## 2018-04-12 MED ORDER — KETOROLAC TROMETHAMINE 30 MG/ML IJ SOLN
15.0000 mg | Freq: Once | INTRAMUSCULAR | Status: AC
  Administered 2018-04-12: 15 mg via INTRAVENOUS

## 2018-04-12 NOTE — ED Provider Notes (Addendum)
[facility] Emergency Department Provider Note  ____________________________________________   I have reviewed the triage vital signs and the nursing notes. Where available I have reviewed prior notes and, if possible and indicated, outside hospital notes.    HISTORY  Chief Complaint Hematuria    HPI Susan Houston is a 57 y.o. female  With a history of laryngeal cancer chronic leg pain COPD depression diverticulitis kidney stones and other medical problems presents today complaining of suprapubic abdominal discomfort started about an hour ago with pink-tinged urine.  No fever no chills no vomiting, no diarrhea no melena no bright red blood per rectum, has not had this before at least in several years since she had kidney stones.  Pain does not radiate is sharp and cramping, nothing makes better nothing makes it worse no other associated symptoms not associated with dysuria, no antecedent treatment.  Has been going on for about an hour.  Patient does have a history of taking aspirin but no other blood thinners has not had hematuria and recent memory.     Past Medical History:  Diagnosis Date  . Anxiety   . Arthritis   . Asthma   . Back pain   . Bruises easily   . Cancer ([place]) 2009   LARYNX CANCER - CHEMO / RADIATION (NO SURGERY)   . Chronic leg pain    BILATERAL  . COPD (chronic obstructive pulmonary disease) ([place])   . Depression   . Difficulty sleeping   . Diverticulitis   . Hemorrhoids   . History of kidney stones   . Hyperlipidemia   . Hypertension   . Incontinence of urine   . Migraine   . MRSA infection greater than 3 months ago    2008 right side of face  . Nerve damage    right leg    Patient Active Problem List   Diagnosis Date Noted  . Vitamin B12 deficiency 12/15/2017  . Frequent falls 11/23/2017  . Mild cognitive impairment 11/23/2017  . Migraine without aura and without status migrainosus, not intractable 09/11/2016  .  Hyperlipidemia   . COPD (chronic obstructive pulmonary disease) ([place])   . Chronic pain 12/24/2015  . Severe depression ([place]) 12/24/2015  . Chronic anxiety 12/24/2015  . Coarse tremors 12/24/2015  . Facet syndrome, lumbar 03/28/2015  . DDD (degenerative disc disease), lumbar 03/05/2015  . DDD (degenerative disc disease), lumbosacral 02/23/2015  . Sacroiliac joint dysfunction 02/23/2015  . Saphenous neuralgia 09/24/2014    Past Surgical History:  Procedure Laterality Date  . ABDOMINAL HYSTERECTOMY  1983  . BLADDER SUSPENSION  2014  . CHOLECYSTECTOMY    . COLONOSCOPY    . ESOPHAGOGASTRODUODENOSCOPY    . ESOPHAGOGASTRODUODENOSCOPY (EGD) WITH PROPOFOL N/A 11/20/2016   Procedure: ESOPHAGOGASTRODUODENOSCOPY (EGD) WITH PROPOFOL;  Surgeon: Jonathon Bellows, MD;  Location: [facility] ENDOSCOPY;  Service: Endoscopy;  Laterality: N/A;  . EXCISION NEUROMA Bilateral 09/24/2014   Procedure: BILATERAL OPEN SAPHENOUS NEURECTOMIES AND OPEN LEFT PERIPATELLA OSETOPHYTECTOMY;  Surgeon: Mauri Pole, MD;  Location: [facility] ORS;  Service: Orthopedics;  Laterality: Bilateral;  . JOINT REPLACEMENT  2009/2010   BIL TOTAL KNEES  . TONSILLECTOMY      Prior to Admission medications   Medication Sig Start Date End Date Taking? Authorizing Provider  aspirin 81 MG tablet Take 81 mg by mouth daily.    [provider]  busPIRone (BUSPAR) 15 MG tablet Take 15 mg by mouth daily. 07/23/16   [provider]  doxepin (SINEQUAN) 25 MG capsule  TAKE ONE CAPSULE BY MOUTH AT BEDTIME 01/20/18   Ursula Alert, MD  HYDROcodone-acetaminophen (NORCO/VICODIN) 5-325 MG tablet Take 1 tablet by mouth every 6 (six) hours as needed for moderate pain. 03/17/18   Kathrine Haddock, NP  LORazepam (ATIVAN) 0.5 MG tablet Take 0.5-1 tablets (0.25-0.5 mg total) by mouth daily. Take 0.25 mg in the AM and 0.5 mg po qhs 01/06/18   Ursula Alert, MD  mirabegron ER (MYRBETRIQ) 50 MG TB24 tablet Take 1 tablet (50 mg total) by mouth daily. 02/28/18    Bjorn Loser, MD  sulfamethoxazole-trimethoprim (BACTRIM DS,SEPTRA DS) 800-160 MG tablet Take 1 tablet by mouth 2 (two) times daily. 03/17/18   Kathrine Haddock, NP  SUMAtriptan (IMITREX) 100 MG tablet TAKE 1 TAB BY MOUTH AT START OF MIGRAINE. MAY REPEAT IN 2 HOURS IF HEADACHE PERSISTS OR RECURS. MAX: 2 TABS IN 24 HRS. 05/28/17   Melvenia Beam, MD  tiZANidine (ZANAFLEX) 4 MG tablet Take 4 mg by mouth at bedtime.    [provider]  traZODone (DESYREL) 100 MG tablet TAKE 1 AND 1/2 TABLETS(150 MG) BY MOUTH AT BEDTIME Patient taking differently: TAKE 1/2 TABLETS(150 MG) BY MOUTH AT BEDTIME 01/06/18   Ursula Alert, MD  valACYclovir (VALTREX) 1000 MG tablet Take 1 tablet (1,000 mg total) by mouth 3 (three) times daily. 03/17/18   Kathrine Haddock, NP    Allergies Lexapro [escitalopram oxalate] and Morphine and related  Family History  Problem Relation Age of Onset  . Asthma Mother   . Diabetes Mother   . Hyperlipidemia Mother   . Hypertension Mother   . Cirrhosis Mother        Non-alcoholic  . Arthritis Father   . Cancer Father        Bone  . Hyperlipidemia Father   . Hypertension Father   . Anxiety disorder Sister   . Depression Sister   . Kidney Stones Son   . Stroke Maternal Grandmother   . Breast cancer Maternal Grandmother 60  . Cancer Maternal Grandfather        lung  . Pneumonia Paternal Grandmother   . Alzheimer's disease Paternal Grandfather   . Diabetes Sister   . Gout Sister   . Hypertension Sister   . Alcohol abuse Brother   . Heart disease Brother        massive MI    Social History Social History   Tobacco Use  . Smoking status: Former Smoker    Packs/day: 0.50    Types: Cigarettes    Last attempt to quit: 04/07/2016    Years since quitting: 2.0  . Smokeless tobacco: Never Used  . Tobacco comment: patient has not smoked x 10 days.06/28/17 chantix  Substance Use Topics  . Alcohol use: No    Alcohol/week: 0.0 oz  . Drug use: No    Review of  Systems Constitutional: No fever/chills Eyes: No visual changes. ENT: No sore throat. No stiff neck no neck pain Cardiovascular: Denies chest pain. Respiratory: Denies shortness of breath. Gastrointestinal:   no vomiting.  No diarrhea.  No constipation. Genitourinary: Negative for dysuria.  Positive hematuria Musculoskeletal: Negative lower extremity swelling Skin: Negative for rash. Neurological: Negative for severe headaches, focal weakness or numbness.   ____________________________________________   PHYSICAL EXAM:  VITAL SIGNS: ED Triage Vitals [04/12/18 1942]  Enc Vitals Group     BP (!) 192/106     Pulse Rate (!) 102     Resp 18     Temp 98.3 F (36.8 C)  Temp Source Oral     SpO2 100 %     Weight 190 lb (86.2 kg)     Height      Head Circumference      Peak Flow      Pain Score      Pain Loc      Pain Edu?      Excl. in [place]?     Constitutional: Alert and oriented. Well appearing and in no acute distress.  She does Eyes: Conjunctivae are normal Head: Atraumatic HEENT: No congestion/rhinnorhea. Mucous membranes are moist.  Oropharynx non-erythematous Neck:   Nontender with no meningismus, no masses, no stridor Cardiovascular: Normal rate, regular rhythm. Grossly normal heart sounds.  Good peripheral circulation. Respiratory: Normal respiratory effort.  No retractions. Lungs CTAB. Abdominal: Soft and mild suprapubic tenderness. No distention. No guarding no rebound Back:  There is no focal tenderness or step off.  there is no midline tenderness there are no lesions noted. there is no CVA tenderness  Musculoskeletal: No lower extremity tenderness, no upper extremity tenderness. No joint effusions, no DVT signs strong distal pulses no edema Neurologic:  Normal speech and language. No gross focal neurologic deficits are appreciated.  Skin:  Skin is warm, dry and intact. No rash noted. Psychiatric: Mood and affect are anxious. Speech and behavior are  normal.  ____________________________________________   LABS (all labs ordered are listed, but only abnormal results are displayed)  Labs Reviewed  CBC  URINALYSIS, COMPLETE (UACMP) WITH MICROSCOPIC  BASIC METABOLIC PANEL  PROTIME-INR    Pertinent labs  results that were available during my care of the patient were reviewed by me and considered in my medical decision making (see chart for details). ____________________________________________  EKG  I personally interpreted any EKGs ordered by me or triage  ____________________________________________  RADIOLOGY  Pertinent labs & imaging results that were available during my care of the patient were reviewed by me and considered in my medical decision making (see chart for details). If possible, patient and/or family made aware of any abnormal findings.  No results found. ____________________________________________    PROCEDURES  Procedure(s) performed: None  Procedures  Critical Care performed: None  ____________________________________________   INITIAL IMPRESSION / ASSESSMENT AND PLAN / ED COURSE  Pertinent labs & imaging results that were available during my care of the patient were reviewed by me and considered in my medical decision making (see chart for details).  She did no acute distress however very anxious and upset about the possibility this is a significant problem.  She is requesting pain medication abdomen is nonsurgical at this time, we will give her fentanyl as she has had itching with morphine in the past.  She has no anaphylactic history.  Hematuria and suprapubic discomfort differential includes UTI kidney stone bladder cancer and other pathologies, AAA is possible but not thought to be is likely given her exam.  Will obtain renal colic CT and we will check coags.  Platelet count is reassuring.  Patient in no acute distress very anxious and we will recheck blood pressure after she is more reassured.   Have done my best to reassure her.  ----------------------------------------- 9:24 PM on 04/12/2018 -----------------------------------------  Pt pain free after toradol.   1. Slight hematuria and suprapubic pain. S/p hyst. Ct neg. ?passed stone? W/u negative.  Is nonsurgical, patient will require close follow-up with urology for possible cystoscopy for this, no evidence or history consistent with  urinary tract infection, we will send patient's urine for  culture.  2.  Patient has a spiculated small mass.  I had an extensive discussion with her and family about this.  It is in her lungs.  She is asymptomatic.  This is an incidental finding.  She does have a history of laryngeal cancer.  We will defer further work-up of this incidental finding to her oncologist but I did strongly suggest that she follow-up with them in the next few days and she will do so.  Patient given absolute instructions to not let this go.  Patient and family understand and agree.  Return precautions follow-up given understood    ____________________________________________   FINAL CLINICAL IMPRESSION(S) / ED DIAGNOSES  Final diagnoses:  None      This chart was dictated using voice recognition software.  Despite best efforts to proofread,  errors can occur which can change meaning.      Schuyler Amor, MD 04/12/18 2026    Schuyler Amor, MD 04/12/18 2129

## 2018-04-12 NOTE — Discharge Instructions (Signed)
Do not fail to follow up with oncology for your lung and urology for your bladder pain in the next few days. Return to the emergency department for any new or worrisome symptoms including, increased pain, fever, vomiting, significant bleeding, shortness of breath, chest pain or any other concerns.  Do not drink or drive while taking the pain medication.

## 2018-04-12 NOTE — ED Triage Notes (Signed)
Pt c/o hematuria and dysuria since this evening. Pt is tearful in triage and sts, "Im a cancer survivor and this has me so scared." Pt describes blood as "light pink."

## 2018-04-12 NOTE — ED Notes (Addendum)
Duplicate note deleted per this RN.

## 2018-04-12 NOTE — ED Notes (Signed)
Patient transported to CT 

## 2018-04-12 NOTE — ED Notes (Signed)
Lab notified to add on Urine Culture. 

## 2018-04-13 DIAGNOSIS — I201 Angina pectoris with documented spasm: Secondary | ICD-10-CM | POA: Diagnosis not present

## 2018-04-13 DIAGNOSIS — Z01818 Encounter for other preprocedural examination: Secondary | ICD-10-CM | POA: Diagnosis not present

## 2018-04-13 DIAGNOSIS — R0602 Shortness of breath: Secondary | ICD-10-CM | POA: Diagnosis not present

## 2018-04-14 ENCOUNTER — Telehealth: Payer: Self-pay | Admitting: Family Medicine

## 2018-04-14 LAB — URINE CULTURE

## 2018-04-14 NOTE — Telephone Encounter (Signed)
Lase get patient scheduled for appointment

## 2018-04-14 NOTE — Telephone Encounter (Signed)
Copied from [place] [id number]. Topic: Referral - Request >> Apr 13, 2018  2:30 PM Hewitt Shorts wrote: Pt went to the emergency room last night due to blood in urine and she states that they did not find anything really did a cat scan and found a spot on he right r lung a lower lobe mass  and suggested that she calls the cancer center to make an appt  but when she did they states that she needed to get in touch with her PCP and they will need to do a referral   Best number [phone]

## 2018-04-15 ENCOUNTER — Ambulatory Visit: Payer: Medicare Other | Admitting: Physician Assistant

## 2018-04-15 ENCOUNTER — Encounter: Payer: Self-pay | Admitting: Physician Assistant

## 2018-04-15 VITALS — BP 123/74 | HR 60 | Temp 98.5°F | Ht 62.0 in | Wt 193.6 lb

## 2018-04-15 DIAGNOSIS — R911 Solitary pulmonary nodule: Secondary | ICD-10-CM

## 2018-04-15 NOTE — Patient Instructions (Signed)
Pulmonary Nodule A pulmonary nodule is a small, round spot in your lung. It is usually found when pictures of your lungs are taken for other reasons. Most pulmonary nodules are not cancerous and do not cause symptoms. Tests will be done to make sure the nodule is not cancerous. Pulmonary nodules that are not cancerous usually do not require treatment. Follow these instructions at home:  Only take medicine as told by your doctor.  Follow up with your doctor as told. Contact a doctor if:  You have trouble breathing when doing activities.  You feel sick or more tired than normal.  You do not feel like eating.  You lose weight without trying to.  You have chills.  You have night sweats. Get help right away if:  You cannot catch your breath.  You start making whistling sounds when breathing (wheezing).  You have a cough that does not go away.  You cough up blood.  You are dizzy or feel like you are going to pass out.  You have sudden chest pain.  You have a fever or lasting symptoms for more than 2-3 days.  You have a fever and your symptoms suddenly get worse. This information is not intended to replace advice given to you by your health care provider. Make sure you discuss any questions you have with your health care provider. Document Released: 11/07/2010 Document Revised: 03/12/2016 Document Reviewed: 03/27/2013 Elsevier Interactive Patient Education  2017 [employer].

## 2018-04-15 NOTE — Progress Notes (Signed)
Subjective:    Patient ID: Susan Houston, female    DOB: [birth year], 57 y.o.   MRN: [id number]  Susan Houston is a 57 y.o. female presenting on 04/15/2018 for Hospitalization Follow-up; Lung Lesion; and Referral   HPI   PMH: 45 pack year smoking history, quit two years ago. History of laryngeal cancer 10 years ago, treated with chemotherapy and radiation at [facility].   Was seen in Emergency Room on 04/12/2018 for abdominal pain. CT renal study did not show abdominal pathology, however there was an incidental finding of a "10x9 mm peripheral subpleural RLL spiculated nodule, suspicious for early lung cancer. This is new compared to 03/22/2017."   Here to discuss referral for oncology.   Aug 2018: 221 lbs Feb 2019: 195 lbs  Denies fevers, chills, trouble breathing, SOB, coughing.   Social History   Tobacco Use  . Smoking status: Former Smoker    Packs/day: 0.50    Types: Cigarettes    Last attempt to quit: 04/07/2016    Years since quitting: 2.0  . Smokeless tobacco: Never Used  . Tobacco comment: patient has not smoked x 10 days.06/28/17 chantix  Substance Use Topics  . Alcohol use: No    Alcohol/week: 0.0 oz  . Drug use: No    Review of Systems Per HPI unless specifically indicated above     Objective:    BP 123/74 (BP Location: Left Arm, Patient Position: Sitting, Cuff Size: Normal)   Pulse 60   Temp 98.5 F (36.9 C) (Oral)   Ht 5\' 2"  (1.575 m)   Wt 193 lb 9.6 oz (87.8 kg)   SpO2 97%   BMI 35.41 kg/m   Wt Readings from Last 3 Encounters:  04/15/18 193 lb 9.6 oz (87.8 kg)  04/12/18 190 lb (86.2 kg)  03/17/18 193 lb 6.4 oz (87.7 kg)    Physical Exam  Constitutional: She is oriented to person, place, and time. She appears well-developed and well-nourished.  Cardiovascular: Normal rate and regular rhythm.  Pulmonary/Chest: Effort normal and breath sounds normal.  Neurological: She is alert and oriented to person, place, and time.  Skin: Skin is warm and  dry.  Psychiatric: She has a normal mood and affect. Her behavior is normal.   Results for orders placed or performed during the hospital encounter of 04/12/18  Urine culture  Result Value Ref Range   Specimen Description      URINE, RANDOM Performed at [facility], [address] [place], [place] [address]    Special Requests      NONE Performed at [facility], [address] [place], [place] [address]    Culture (A)     <10,000 COLONIES/mL INSIGNIFICANT GROWTH Performed at [facility] [address] [place], [place] [address]    Report Status 04/14/2018 FINAL   Urinalysis, Complete w Microscopic  Result Value Ref Range   Color, Urine COLORLESS (A) YELLOW   APPearance CLEAR (A) CLEAR   Specific Gravity, Urine 1.001 (L) 1.005 - 1.030   pH 6.0 5.0 - 8.0   Glucose, UA NEGATIVE NEGATIVE mg/dL   Hgb urine dipstick MODERATE (A) NEGATIVE   Bilirubin Urine NEGATIVE NEGATIVE   Ketones, ur NEGATIVE NEGATIVE mg/dL   Protein, ur NEGATIVE NEGATIVE mg/dL   Nitrite NEGATIVE NEGATIVE   Leukocytes, UA SMALL (A) NEGATIVE   RBC / HPF 0-5 0 - 5 RBC/hpf   WBC, UA 0-5 0 - 5 WBC/hpf   Bacteria, UA  NONE SEEN NONE SEEN   Squamous Epithelial / LPF NONE SEEN 0 - 5  Basic metabolic panel  Result Value Ref Range   Sodium 137 135 - 145 mmol/L   Potassium 3.3 (L) 3.5 - 5.1 mmol/L   Chloride 104 98 - 111 mmol/L   CO2 23 22 - 32 mmol/L   Glucose, Bld 97 70 - 99 mg/dL   BUN 13 6 - 20 mg/dL   Creatinine, Ser 0.84 0.44 - 1.00 mg/dL   Calcium 9.2 8.9 - 10.3 mg/dL   GFR calc non Af Amer >60 >60 mL/min   GFR calc Af Amer >60 >60 mL/min   Anion gap 10 5 - 15  CBC  Result Value Ref Range   WBC 8.0 3.6 - 11.0 K/uL   RBC 4.23 3.80 - 5.20 MIL/uL   Hemoglobin 12.8 12.0 - 16.0 g/dL   HCT 37.7 35.0 - 47.0 %   MCV 89.2 80.0 - 100.0 fL   MCH 30.2 26.0 - 34.0 pg   MCHC 33.9 32.0 - 36.0 g/dL   RDW 14.0 11.5 - 14.5 %   Platelets 251 150 - 440 K/uL      Assessment & Plan:     1. Nodule of lower lobe of right lung  Have reviewed imaging from ER. Nodule suspicious for cancer and patient high risk with smoking history.Will refer to oncology. She should be contacted within a week and if she is not, she has been instructed to call this clinic. Stressed importance of making this appointment.   - Ambulatory referral to Oncology    Follow up plan: Return if symptoms worsen or fail to improve.  Carles Collet, PA-C [facility] 04/15/2018, 12:02 PM

## 2018-04-18 DIAGNOSIS — I499 Cardiac arrhythmia, unspecified: Secondary | ICD-10-CM

## 2018-04-18 DIAGNOSIS — C3431 Malignant neoplasm of lower lobe, right bronchus or lung: Secondary | ICD-10-CM

## 2018-04-18 DIAGNOSIS — Z87442 Personal history of urinary calculi: Secondary | ICD-10-CM

## 2018-04-18 HISTORY — DX: Malignant neoplasm of lower lobe, right bronchus or lung: C34.31

## 2018-04-18 HISTORY — DX: Personal history of urinary calculi: Z87.442

## 2018-04-18 HISTORY — DX: Cardiac arrhythmia, unspecified: I49.9

## 2018-04-22 ENCOUNTER — Inpatient Hospital Stay: Payer: Medicare Other | Attending: Internal Medicine | Admitting: Internal Medicine

## 2018-04-22 ENCOUNTER — Other Ambulatory Visit: Payer: Self-pay

## 2018-04-22 VITALS — BP 137/83 | HR 87 | Temp 97.2°F | Resp 18

## 2018-04-22 DIAGNOSIS — Z79899 Other long term (current) drug therapy: Secondary | ICD-10-CM | POA: Diagnosis not present

## 2018-04-22 DIAGNOSIS — Z87891 Personal history of nicotine dependence: Secondary | ICD-10-CM | POA: Diagnosis not present

## 2018-04-22 DIAGNOSIS — R911 Solitary pulmonary nodule: Secondary | ICD-10-CM

## 2018-04-22 DIAGNOSIS — Z8521 Personal history of malignant neoplasm of larynx: Secondary | ICD-10-CM

## 2018-04-22 DIAGNOSIS — R918 Other nonspecific abnormal finding of lung field: Secondary | ICD-10-CM | POA: Insufficient documentation

## 2018-04-22 DIAGNOSIS — R319 Hematuria, unspecified: Secondary | ICD-10-CM | POA: Insufficient documentation

## 2018-04-22 DIAGNOSIS — Z923 Personal history of irradiation: Secondary | ICD-10-CM | POA: Diagnosis not present

## 2018-04-22 DIAGNOSIS — Z7982 Long term (current) use of aspirin: Secondary | ICD-10-CM | POA: Insufficient documentation

## 2018-04-22 DIAGNOSIS — Z9221 Personal history of antineoplastic chemotherapy: Secondary | ICD-10-CM | POA: Diagnosis not present

## 2018-04-22 DIAGNOSIS — R0602 Shortness of breath: Secondary | ICD-10-CM | POA: Diagnosis not present

## 2018-04-22 DIAGNOSIS — C329 Malignant neoplasm of larynx, unspecified: Secondary | ICD-10-CM | POA: Insufficient documentation

## 2018-04-22 HISTORY — DX: Solitary pulmonary nodule: R91.1

## 2018-04-22 NOTE — Progress Notes (Signed)
[name]  Patient Care Team: Valerie Roys, DO as PCP - General (Family Medicine) Anabel Bene, MD as Referring Physician (Neurology) Telford Nab, RN as Registered Nurse  CHIEF COMPLAINTS/PURPOSE OF CONSULTATION: lung nodule  # 2009- Laryngeal ca  #  Oncology History   # 2011-laryngeal cancer/SCC; Stage II; April-June 2011 s/p  CRT [Dr.Choski/Bennett]  # 2019- 10 mm RLL nodule [incidental]     Laryngeal cancer ([place])     HISTORY OF PRESENTING ILLNESS:  Susan Houston 57 y.o.  female long-standing history of smoking-quit approximately a year ago; prior history of stage II laryngeal cancer; is here for a follow-up after new diagnosis of lung nodule.  Patient states that she had episode of blood in urine few weeks ago.  That led to further evaluation with a CT scan renal protocol noncontrast that showed-no obvious evidence of bladder or kidney abnormalities.  However incidentally picked up a little nodule in the right lower lobe.  Patient currently denies passing blood.  Denies any abdominal pain. chronic mild shortness of breath.  Otherwise, no worsening cough or hemoptysis.  Review of Systems  Constitutional: Negative for chills, diaphoresis and fever.  HENT: Negative for nosebleeds and sore throat.   Eyes: Negative for double vision.  Respiratory: Positive for shortness of breath. Negative for hemoptysis, sputum production and wheezing.   Cardiovascular: Negative for chest pain, palpitations, orthopnea and leg swelling.  Gastrointestinal: Negative for abdominal pain, blood in stool, constipation, diarrhea, heartburn, melena, nausea and vomiting.  Genitourinary: Positive for hematuria. Negative for dysuria, frequency and urgency.  Musculoskeletal: Negative for back pain and joint pain.  Skin: Negative.  Negative for itching and rash.  Neurological: Negative for dizziness, tingling, focal weakness, weakness and headaches.   Endo/Heme/Allergies: Does not bruise/bleed easily.  Psychiatric/Behavioral: Negative for depression. The patient is not nervous/anxious and does not have insomnia.      MEDICAL HISTORY:  Past Medical History:  Diagnosis Date  . Anxiety   . Arthritis   . Asthma   . Back pain   . Bruises easily   . Cancer ([place]) 2009   LARYNX CANCER - CHEMO / RADIATION (NO SURGERY)   . Chronic leg pain    BILATERAL  . COPD (chronic obstructive pulmonary disease) ([place])   . Depression   . Difficulty sleeping   . Diverticulitis   . Hemorrhoids   . History of kidney stones   . Hyperlipidemia   . Hypertension   . Incontinence of urine   . Migraine   . MRSA infection greater than 3 months ago    2008 right side of face  . Nerve damage    right leg    SURGICAL HISTORY: Past Surgical History:  Procedure Laterality Date  . ABDOMINAL HYSTERECTOMY  1983  . BLADDER SUSPENSION  2014  . CHOLECYSTECTOMY    . COLONOSCOPY    . ESOPHAGOGASTRODUODENOSCOPY    . ESOPHAGOGASTRODUODENOSCOPY (EGD) WITH PROPOFOL N/A 11/20/2016   Procedure: ESOPHAGOGASTRODUODENOSCOPY (EGD) WITH PROPOFOL;  Surgeon: Jonathon Bellows, MD;  Location: [facility] ENDOSCOPY;  Service: Endoscopy;  Laterality: N/A;  . EXCISION NEUROMA Bilateral 09/24/2014   Procedure: BILATERAL OPEN SAPHENOUS NEURECTOMIES AND OPEN LEFT PERIPATELLA OSETOPHYTECTOMY;  Surgeon: Mauri Pole, MD;  Location: [facility];  Service: Orthopedics;  Laterality: Bilateral;  . JOINT REPLACEMENT  2009/2010   BIL TOTAL KNEES  . TONSILLECTOMY      SOCIAL HISTORY: 40ppd of  smoking; quit smoking 2018;  no alcohol; used to work in  hosiery; [place]; lives with husband. Social History   Socioeconomic History  . Marital status: Married    Spouse name: Tharon Aquas  . Number of children: 2  . Years of education: 68  . Highest education level: 12th grade  Occupational History  . Occupation: Disabled  Social Needs  . Financial resource strain: Not hard at all  . Food insecurity:     Worry: Never true    Inability: Never true  . Transportation needs:    Medical: No    Non-medical: No  Tobacco Use  . Smoking status: Former Smoker    Packs/day: 0.50    Types: Cigarettes    Last attempt to quit: 04/07/2016    Years since quitting: 2.0  . Smokeless tobacco: Never Used  . Tobacco comment: patient has not smoked x 10 days.06/28/17 chantix  Substance and Sexual Activity  . Alcohol use: No    Alcohol/week: 0.0 oz  . Drug use: No  . Sexual activity: Never  Lifestyle  . Physical activity:    Days per week: 0 days    Minutes per session: 0 min  . Stress: Not at all  Relationships  . Social connections:    Talks on phone: Never    Gets together: Twice a week    Attends religious service: More than 4 times per year    Active member of club or organization: No    Attends meetings of clubs or organizations: Never    Relationship status: Married  . Intimate partner violence:    Fear of current or ex partner: No    Emotionally abused: No    Physically abused: No    Forced sexual activity: No  Other Topics Concern  . Not on file  Social History Narrative   Lives at home w/ her husband   Right-handed   Caffeine: occasionally    FAMILY HISTORY: Family History  Problem Relation Age of Onset  . Asthma Mother   . Diabetes Mother   . Hyperlipidemia Mother   . Hypertension Mother   . Cirrhosis Mother        Non-alcoholic  . Arthritis Father   . Cancer Father        Bone  . Hyperlipidemia Father   . Hypertension Father   . Anxiety disorder Sister   . Depression Sister   . Kidney Stones Son   . Stroke Maternal Grandmother   . Breast cancer Maternal Grandmother 55  . Cancer Maternal Grandfather        lung  . Pneumonia Paternal Grandmother   . Alzheimer's disease Paternal Grandfather   . Diabetes Sister   . Gout Sister   . Hypertension Sister   . Alcohol abuse Brother   . Heart disease Brother        massive MI    ALLERGIES:  is allergic to lexapro  [escitalopram oxalate] and morphine and related.  MEDICATIONS:  Current Outpatient Medications  Medication Sig Dispense Refill  . aspirin 81 MG tablet Take 81 mg by mouth daily.    . busPIRone (BUSPAR) 15 MG tablet Take 15 mg by mouth daily.    Marland Kitchen doxepin (SINEQUAN) 25 MG capsule Take 25 mg by mouth at bedtime.    Marland Kitchen LORazepam (ATIVAN) 0.5 MG tablet Take 0.5-1 tablets (0.25-0.5 mg total) by mouth daily. Take 0.25 mg in the AM and 0.5 mg po qhs 45 tablet 0  . magnesium citrate SOLN Take 148 mLs by mouth as needed for moderate constipation or severe  constipation (if not improved with milk of mag).    . magnesium hydroxide (MILK OF MAGNESIA) 400 MG/5ML suspension Take 30 mLs by mouth daily as needed for mild constipation or moderate constipation.    Marland Kitchen nystatin ointment (MYCOSTATIN) Apply 1 application topically 2 (two) times daily.    . ondansetron (ZOFRAN-ODT) 4 MG disintegrating tablet Take 4 mg by mouth every 8 (eight) hours as needed for nausea or vomiting.    . SUMAtriptan (IMITREX) 100 MG tablet TAKE 1 TAB BY MOUTH AT START OF MIGRAINE. MAY REPEAT IN 2 HOURS IF HEADACHE PERSISTS OR RECURS. MAX: 2 TABS IN 24 HRS. 10 tablet 0  . tiZANidine (ZANAFLEX) 4 MG capsule Take 4 mg by mouth 3 (three) times daily.    . traZODone (DESYREL) 100 MG tablet Take 100 mg by mouth at bedtime.    . vitamin B-12 (CYANOCOBALAMIN) 1000 MCG tablet Take 1,000 mcg by mouth daily.     No current facility-administered medications for this visit.    Facility-Administered Medications Ordered in Other Visits  Medication Dose Route Frequency Provider Last Rate Last Dose  . ePHEDrine injection    Anesthesia Intra-op Darrin Nipper, MD   20 mg at 03/02/18 0830  . glycopyrrolate (ROBINUL) injection    Anesthesia Intra-op Darrin Nipper, MD   0.2 mg at 03/02/18 0830      .  PHYSICAL EXAMINATION: ECOG PERFORMANCE STATUS: 1 - Symptomatic but completely ambulatory  Vitals:   04/22/18 0915  BP: 137/83  Pulse: 87   Resp: 18  Temp: (!) 97.2 F (36.2 C)   There were no vitals filed for this visit.  GENERAL: Well-nourished well-developed; Alert, no distress and comfortable.  Accompanied by family.  EYES: no pallor or icterus OROPHARYNX: no thrush or ulceration; NECK: supple; no lymph nodes felt. LYMPH:  no palpable lymphadenopathy in the axillary or inguinal regions LUNGS: Decreased breath sounds auscultation bilaterally. No wheeze or crackles HEART/CVS: regular rate & rhythm and no murmurs; No lower extremity edema ABDOMEN:abdomen soft, non-tender and normal bowel sounds. No hepatomegaly or splenomegaly.  Musculoskeletal:no cyanosis of digits and no clubbing  PSYCH: alert & oriented x 3 with fluent speech NEURO: no focal motor/sensory deficits SKIN:  no rashes or significant lesions  LABORATORY DATA:  I have reviewed the data as listed Lab Results  Component Value Date   WBC 8.0 04/12/2018   HGB 12.8 04/12/2018   HCT 37.7 04/12/2018   MCV 89.2 04/12/2018   PLT 251 04/12/2018   Recent Labs    12/03/17 1447  12/06/17 1704 03/02/18 0930 03/02/18 1213 04/12/18 2001  NA 137   < > 136 137 139 137  K 4.9  --  3.5 4.1 3.8 3.3*  CL 99  --  105 103 105 104  CO2 19*  --  22 26 26 23   GLUCOSE 95   < > 122* 98 129* 97  BUN 8   < > 5* 16 13 13   CREATININE 0.88  --  0.81 0.84 0.80 0.84  CALCIUM 9.6  --  9.0 9.0 9.4 9.2  GFRNONAA 73  --  >60 >60 >60 >60  GFRAA 84  --  >60 >60 >60 >60  PROT 6.8  --  7.5  --   --   --   ALBUMIN 3.8  --  3.7  --   --   --   AST 14  --  30  --   --   --   ALT 7  --  6*  --   --   --   ALKPHOS 83  --  80  --   --   --   BILITOT 0.6  --  0.9  --   --   --    < > = values in this interval not displayed.    RADIOGRAPHIC STUDIES: I have personally reviewed the radiological images as listed and agreed with the findings in the report. Ct Renal Stone Study  Result Date: 04/12/2018 CLINICAL DATA:  Hematuria, left lower quadrant abdominal and flank pain, history of  nephrolithiasis EXAM: CT ABDOMEN AND PELVIS WITHOUT CONTRAST TECHNIQUE: Multidetector CT imaging of the abdomen and pelvis was performed following the standard protocol without IV contrast. COMPARISON:  03/22/2017 FINDINGS: Lower chest: 10 x 9 mm spiculated nodule in the right lower lobe laterally, images 5 and 6. Lung bases otherwise clear. No acute airspace process, collapse or consolidation. No pleural effusion. Normal heart size. No pericardial effusion. Hepatobiliary: No focal liver abnormality is seen. Status post cholecystectomy. No biliary dilatation. Pancreas: Unremarkable. No pancreatic ductal dilatation or surrounding inflammatory changes. Spleen: Normal in size without focal abnormality. Adrenals/Urinary Tract: Adrenal glands are unremarkable. Kidneys are normal, without renal calculi, focal lesion, or hydronephrosis. Bladder is unremarkable. Stomach/Bowel: Negative for bowel obstruction, significant dilatation, ileus, free air. Scattered colonic diverticulosis. No acute inflammatory process. Appendix not visualized. Distal colon collapsed. No fluid collection or abscess. Vascular/Lymphatic: Aortic atherosclerosis evident. Negative for aneurysm. No retroperitoneal hemorrhage or hematoma. No adenopathy. Reproductive: Remote hysterectomy. No pelvic or adnexal mass. No free fluid or fluid collection. Other: No inguinal hernia. Small fat containing umbilical hernia. No ascites. Musculoskeletal: Degenerative changes of the spine. No acute osseous finding. IMPRESSION: No acute obstructing urinary tract or ureteral calculus. No hydronephrosis or obstructive uropathy. Remote cholecystectomy and hysterectomy Appendix not visualized but no acute inflammatory process. No acute intra-abdominal or pelvic finding by CT Colonic diverticulosis without acute inflammatory process Small fat containing umbilical hernia 10 x 9 mm peripheral subpleural right lower lobe spiculated nodule, suspicious for early lung cancer. This  is new compared to 03/22/2017. Consider one of the following in 3 months for both low-risk and high-risk individuals: (a) repeat chest CT, (b) follow-up PET-CT. This recommendation follows the consensus statement: Guidelines for Management of Incidental Pulmonary Nodules Detected on CT Images: From the Fleischner Society 2017; Radiology 2017; 284:228-243. These results were called by telephone at the time of interpretation on 04/12/2018 at 8:40 pm to Dr. Charlotte Crumb , who verbally acknowledged these results. Electronically Signed   By: Jerilynn Mages.  Shick M.D.   On: 04/12/2018 20:43    ASSESSMENT & PLAN:   Nodule of lower lobe of right lung # Spiculated right lower lobe lung nodule approximately 10 mm-concerning for malignancy [incidentally noted on renal CT scan]-as patient context after long-standing history of smoking/and prior history of laryngeal cancer.  This is unlikely to be metastatic disease; suspect new primary.  #At this time I would recommend further evaluation of the rest of the chest with a CT scan of the chest.  Discussed that given the small size of the nodule-a PET scan at this time might not be helpful.  However at this time await CT scan first of the chest-to decide of the next time.  #Also discussed with patient and family that if there is a solitary nodule does turn out to be malignancy the option would be surgery/SBRT-the goal being cured.  #Laryngeal cancer stage II status post chemoradiation 2011-reviewed the records in detail/summarized  as above clinically no evidence of recurrence.  # # I reviewed the blood work- with the patient in detail; also reviewed the imaging independently [as summarized above]; and with the patient in detail.  #We will review the imaging at the tumor conference.  Plan of care/follow-up to be decided based upon the results of the CT scan.   # 60 minutes face-to-face with the patient discussing the above plan of care; more than 50% of time spent on  prognosis/ natural history; counseling and coordination.     All questions were answered. The patient knows to call the clinic with any problems, questions or concerns.       Cammie Sickle, MD 04/22/2018 10:54 PM

## 2018-04-22 NOTE — Assessment & Plan Note (Addendum)
#   Spiculated right lower lobe lung nodule approximately 10 mm-concerning for malignancy [incidentally noted on renal CT scan]-as patient context after long-standing history of smoking/and prior history of laryngeal cancer.  This is unlikely to be metastatic disease; suspect new primary.  #At this time I would recommend further evaluation of the rest of the chest with a CT scan of the chest.  Discussed that given the small size of the nodule-a PET scan at this time might not be helpful.  However at this time await CT scan first of the chest-to decide of the next time.  #Also discussed with patient and family that if there is a solitary nodule does turn out to be malignancy the option would be surgery/SBRT-the goal being cured.  #Laryngeal cancer stage II status post chemoradiation 2011-reviewed the records in detail/summarized as above clinically no evidence of recurrence.  # # I reviewed the blood work- with the patient in detail; also reviewed the imaging independently [as summarized above]; and with the patient in detail.  #We will review the imaging at the tumor conference.  Plan of care/follow-up to be decided based upon the results of the CT scan.   # 60 minutes face-to-face with the patient discussing the above plan of care; more than 50% of time spent on prognosis/ natural history; counseling and coordination.

## 2018-04-26 ENCOUNTER — Ambulatory Visit
Admission: RE | Admit: 2018-04-26 | Discharge: 2018-04-26 | Disposition: A | Discharge status: Home / Self Care | Payer: Medicare Other | Source: Ambulatory Visit | Attending: Internal Medicine | Admitting: Internal Medicine

## 2018-04-26 DIAGNOSIS — I2510 Atherosclerotic heart disease of native coronary artery without angina pectoris: Secondary | ICD-10-CM | POA: Insufficient documentation

## 2018-04-26 DIAGNOSIS — R911 Solitary pulmonary nodule: Secondary | ICD-10-CM | POA: Diagnosis present

## 2018-04-26 DIAGNOSIS — J439 Emphysema, unspecified: Secondary | ICD-10-CM | POA: Diagnosis not present

## 2018-04-26 DIAGNOSIS — I700 Atherosclerosis of aorta: Secondary | ICD-10-CM | POA: Diagnosis not present

## 2018-04-26 DIAGNOSIS — R918 Other nonspecific abnormal finding of lung field: Secondary | ICD-10-CM | POA: Insufficient documentation

## 2018-04-26 DIAGNOSIS — J438 Other emphysema: Secondary | ICD-10-CM | POA: Insufficient documentation

## 2018-04-26 DIAGNOSIS — J432 Centrilobular emphysema: Secondary | ICD-10-CM | POA: Diagnosis not present

## 2018-04-26 MED ORDER — IOPAMIDOL (ISOVUE-300) INJECTION 61%
50.0000 mL | Freq: Once | INTRAVENOUS | Status: AC | PRN
  Administered 2018-04-26: 75 mL via INTRAVENOUS

## 2018-04-27 ENCOUNTER — Telehealth: Payer: Self-pay | Admitting: *Deleted

## 2018-04-27 DIAGNOSIS — R911 Solitary pulmonary nodule: Secondary | ICD-10-CM

## 2018-04-27 NOTE — Telephone Encounter (Signed)
Patient asking for CT results   IMPRESSION: 1. Elongated macrolobulated partially cavitary nodular area in the periphery of the right lower lobe, new compared to the prior examination from 10/18/2015. This also appears larger than recent CT the abdomen and pelvis 04/12/2018. The possibility of a primary bronchogenic neoplasm or metastatic lesion should be considered, and further evaluation with PET-CT is recommended in the near future. 2. Aortic atherosclerosis, in addition to left main and 2 vessel coronary artery disease. Please note that although the presence of coronary artery calcium documents the presence of coronary artery disease, the severity of this disease and any potential stenosis cannot be assessed on this non-gated CT examination. Assessment for potential risk factor modification, dietary therapy or pharmacologic therapy may be warranted, if clinically indicated. 3. Diffuse bronchial wall thickening with moderate centrilobular and mild paraseptal emphysema; imaging findings suggestive of underlying COPD.  Aortic Atherosclerosis (ICD10-I70.0) and Emphysema (ICD10-J43.9).   Electronically Signed   By: Vinnie Langton M.D.   On: 04/26/2018 12:43

## 2018-04-27 NOTE — Telephone Encounter (Signed)
Spoke with patient and gave CT results. Will go ahead and order PET scan per Dr. B. Pt aware.

## 2018-04-29 ENCOUNTER — Ambulatory Visit
Admission: RE | Admit: 2018-04-29 | Discharge: 2018-04-29 | Disposition: A | Discharge status: Home / Self Care | Payer: Medicare Other | Source: Ambulatory Visit | Attending: Internal Medicine | Admitting: Internal Medicine

## 2018-04-29 ENCOUNTER — Encounter

## 2018-04-29 ENCOUNTER — Telehealth: Payer: Self-pay | Admitting: Internal Medicine

## 2018-04-29 ENCOUNTER — Ambulatory Visit: Payer: Self-pay | Admitting: Cardiovascular Disease

## 2018-04-29 DIAGNOSIS — J432 Centrilobular emphysema: Secondary | ICD-10-CM | POA: Insufficient documentation

## 2018-04-29 DIAGNOSIS — I700 Atherosclerosis of aorta: Secondary | ICD-10-CM | POA: Diagnosis not present

## 2018-04-29 DIAGNOSIS — R911 Solitary pulmonary nodule: Secondary | ICD-10-CM | POA: Insufficient documentation

## 2018-04-29 DIAGNOSIS — Q263 Partial anomalous pulmonary venous connection: Secondary | ICD-10-CM | POA: Insufficient documentation

## 2018-04-29 DIAGNOSIS — I2510 Atherosclerotic heart disease of native coronary artery without angina pectoris: Secondary | ICD-10-CM | POA: Insufficient documentation

## 2018-04-29 DIAGNOSIS — Z7982 Long term (current) use of aspirin: Secondary | ICD-10-CM | POA: Insufficient documentation

## 2018-04-29 DIAGNOSIS — Z79899 Other long term (current) drug therapy: Secondary | ICD-10-CM | POA: Diagnosis not present

## 2018-04-29 LAB — GLUCOSE, CAPILLARY: Glucose-Capillary: 90 mg/dL (ref 70–99)

## 2018-04-29 MED ORDER — FLUDEOXYGLUCOSE F - 18 (FDG) INJECTION
10.2000 | Freq: Once | INTRAVENOUS | Status: AC | PRN
  Administered 2018-04-29: 10.2 via INTRAVENOUS

## 2018-04-29 NOTE — Telephone Encounter (Signed)
Hildred Alamin- I agree with you; please make a referral to Dr. Faith Rogue.  Also order full PFTs.  Patient can follow-up with me after evaluation with Dr. Faith Rogue; or sooner if the patient is interested.  Thx GB

## 2018-04-29 NOTE — Telephone Encounter (Signed)
Haley-please inform the patient of the findings of the PET scan; and I will plan to review at the tumor conference.  I could see her if she is interested to review the results otherwise she could see Dr. Faith Rogue. thx

## 2018-05-02 NOTE — Telephone Encounter (Signed)
Pt is aware of results. Referral to see Dr. Genevive Bi entered and their office will contact pt with appt.

## 2018-05-02 NOTE — Telephone Encounter (Signed)
Pt is aware of results. Referral to see Dr. Genevive Bi entered and their office will contact pt with appt. PFT's ordered and pt will be contacted with appt.

## 2018-05-02 NOTE — Addendum Note (Signed)
Addended by: Telford Nab on: 05/02/2018 10:06 AM   Modules accepted: Orders

## 2018-05-03 ENCOUNTER — Ambulatory Visit: Payer: Medicare Other | Admitting: Cardiothoracic Surgery

## 2018-05-04 ENCOUNTER — Encounter: Payer: Self-pay | Admitting: *Deleted

## 2018-05-04 ENCOUNTER — Other Ambulatory Visit: Payer: Self-pay

## 2018-05-04 NOTE — Discharge Instructions (Signed)

## 2018-05-05 ENCOUNTER — Ambulatory Visit: Payer: Medicare Other | Attending: Internal Medicine

## 2018-05-05 DIAGNOSIS — Z1239 Encounter for other screening for malignant neoplasm of breast: Secondary | ICD-10-CM

## 2018-05-05 DIAGNOSIS — R911 Solitary pulmonary nodule: Secondary | ICD-10-CM | POA: Insufficient documentation

## 2018-05-05 MED ORDER — ALBUTEROL SULFATE (2.5 MG/3ML) 0.083% IN NEBU
2.5000 mg | INHALATION_SOLUTION | Freq: Once | RESPIRATORY_TRACT | Status: AC
  Administered 2018-05-05: 2.5 mg via RESPIRATORY_TRACT
  Filled 2018-05-05: qty 3

## 2018-05-06 ENCOUNTER — Ambulatory Visit (INDEPENDENT_AMBULATORY_CARE_PROVIDER_SITE_OTHER): Payer: Medicare Other | Admitting: Cardiothoracic Surgery

## 2018-05-06 ENCOUNTER — Other Ambulatory Visit: Payer: Self-pay | Admitting: Cardiothoracic Surgery

## 2018-05-06 ENCOUNTER — Encounter: Payer: Self-pay | Admitting: Cardiothoracic Surgery

## 2018-05-06 VITALS — BP 160/86 | HR 68 | Temp 98.3°F | Resp 18 | Ht 62.0 in | Wt 195.6 lb

## 2018-05-06 DIAGNOSIS — R918 Other nonspecific abnormal finding of lung field: Secondary | ICD-10-CM | POA: Diagnosis not present

## 2018-05-06 NOTE — H&P (View-Only) (Signed)
Patient ID: Susan Houston, female   DOB: [birth year], 57 y.o.   MRN: [id number]  Chief Complaint  Patient presents with  . New Patient (Initial Visit)    RLL pulmonary nodule    Referred By Dr. Lynett Fish Reason for Referral right lower lobe mass  HPI Location, Quality, Duration, Severity, Timing, Context, Modifying Factors, Associated Signs and Symptoms.  Susan Houston is a 57 y.o. female.  Her problems began about 10 years ago when she was diagnosed with laryngeal carcinoma which was treated with radiation therapy and chemotherapy.  She continued to smoke up until 2 years ago when she ultimately quit.  More recently she developed some gross hematuria and some abdominal pain and it was felt that she had a kidney stone.  In the emergency room a CT scan of the abdomen was done and this revealed a right lower lobe lung nodule.  A dedicated chest CT was then performed.  This revealed a 1.4 cm right lower lobe pulmonary nodule.  A PET scan was also performed revealing uptake in the nodule consistent with a malignancy.  She has not had a biopsy of this.  She did have pulmonary function studies.  Her DLCO was approximately 58% of predicted and her FEV1 is in the 70s.  She does not get short of breath.  She states that she is able to walk up a steps without any problems.  She denies any fevers, chills, hemoptysis.  She has not had any weight loss.   Past Medical History:  Diagnosis Date  . Anxiety   . Arthritis   . Asthma   . Back pain   . Bruises easily   . Cancer ([place]) 2009   LARYNX CANCER - CHEMO / RADIATION (NO SURGERY)   . Chronic leg pain    BILATERAL  . COPD (chronic obstructive pulmonary disease) ([place])   . Depression   . Difficulty sleeping   . Diverticulitis   . Facet syndrome, lumbar 03/28/2015  . Hemorrhoids   . History of kidney stones   . Hyperlipidemia   . Hypertension   . Incontinence of urine   . Laryngeal cancer ([place]) 04/22/2018  . Migraine   . Migraine without aura and without  status migrainosus, not intractable 09/11/2016  . Mild cognitive impairment 11/23/2017  . MRSA infection greater than 3 months ago    2008 right side of face  . Nerve damage    right leg  . Nodule of lower lobe of right lung 04/22/2018  . Wears dentures    full upper and lower    Past Surgical History:  Procedure Laterality Date  . ABDOMINAL HYSTERECTOMY  1983  . BLADDER SUSPENSION  2014  . CHOLECYSTECTOMY    . COLONOSCOPY    . ESOPHAGOGASTRODUODENOSCOPY    . ESOPHAGOGASTRODUODENOSCOPY (EGD) WITH PROPOFOL N/A 11/20/2016   Procedure: ESOPHAGOGASTRODUODENOSCOPY (EGD) WITH PROPOFOL;  Surgeon: Jonathon Bellows, MD;  Location: [facility] ENDOSCOPY;  Service: Endoscopy;  Laterality: N/A;  . EXCISION NEUROMA Bilateral 09/24/2014   Procedure: BILATERAL OPEN SAPHENOUS NEURECTOMIES AND OPEN LEFT PERIPATELLA OSETOPHYTECTOMY;  Surgeon: Mauri Pole, MD;  Location: [facility];  Service: Orthopedics;  Laterality: Bilateral;  . JOINT REPLACEMENT  2009/2010   BIL TOTAL KNEES  . TONSILLECTOMY      Family History  Problem Relation Age of Onset  . Asthma Mother   . Diabetes Mother   . Hyperlipidemia Mother   . Hypertension Mother   . Cirrhosis Mother  Non-alcoholic  . Arthritis Father   . Cancer Father        Bone  . Hyperlipidemia Father   . Hypertension Father   . Anxiety disorder Sister   . Depression Sister   . Kidney Stones Son   . Stroke Maternal Grandmother   . Breast cancer Maternal Grandmother 43  . Cancer Maternal Grandfather        lung  . Pneumonia Paternal Grandmother   . Alzheimer's disease Paternal Grandfather   . Diabetes Sister   . Gout Sister   . Hypertension Sister   . Alcohol abuse Brother   . Heart disease Brother        massive MI    Social History Social History   Tobacco Use  . Smoking status: Former Smoker    Packs/day: 0.50    Types: Cigarettes    Last attempt to quit: 04/07/2016    Years since quitting: 2.0  . Smokeless tobacco: Never Used  . Tobacco  comment: patient has not smoked x 10 days.06/28/17 chantix  Substance Use Topics  . Alcohol use: No    Alcohol/week: 0.0 oz  . Drug use: No    Allergies  Allergen Reactions  . Lexapro [Escitalopram Oxalate] Other (See Comments)    Keeps awake for days.   . Morphine And Related Itching    Current Outpatient Medications  Medication Sig Dispense Refill  . aspirin 81 MG tablet Take 81 mg by mouth daily.    Marland Kitchen tiZANidine (ZANAFLEX) 4 MG capsule Take 4 mg by mouth 3 (three) times daily.    . traZODone (DESYREL) 100 MG tablet Take 100 mg by mouth at bedtime.     No current facility-administered medications for this visit.    Facility-Administered Medications Ordered in Other Visits  Medication Dose Route Frequency Provider Last Rate Last Dose  . ePHEDrine injection    Anesthesia Intra-op Darrin Nipper, MD   20 mg at 03/02/18 0830  . glycopyrrolate (ROBINUL) injection    Anesthesia Intra-op Darrin Nipper, MD   0.2 mg at 03/02/18 0830      Review of Systems A complete review of systems was asked and was negative except for the following positive findings she does have difficulty with her vision and.  She is scheduled to have cataract surgery next week.  Blood pressure (!) 160/86, pulse 68, temperature 98.3 F (36.8 C), temperature source Oral, resp. rate 18, height 5\' 2"  (1.575 m), weight 195 lb 9.6 oz (88.7 kg), SpO2 96 %.  Physical Exam CONSTITUTIONAL:  Pleasant, well-developed, well-nourished, and in no acute distress. EYES: Pupils equal and reactive to light, Sclera non-icteric EARS, NOSE, MOUTH AND THROAT:  The oropharynx was clear.  Dentition is absent.  Oral mucosa pink and moist. LYMPH NODES:  Lymph nodes in the neck and axillae were normal RESPIRATORY:  Lungs were clear.  Normal respiratory effort without pathologic use of accessory muscles of respiration CARDIOVASCULAR: Heart was regular without murmurs.  There were no carotid bruits. GI: The abdomen was soft, nontender,  and nondistended. There were no palpable masses. There was no hepatosplenomegaly. There were normal bowel sounds in all quadrants. GU:  Rectal deferred.   MUSCULOSKELETAL:  Normal muscle strength and tone.  No clubbing or cyanosis.   SKIN:  There were no pathologic skin lesions.  There were no nodules on palpation. NEUROLOGIC:  Sensation is normal.  Cranial nerves are grossly intact. PSYCH:  Oriented to person, place and time.  Mood and affect are normal.  Data Reviewed CT scans  I have personally reviewed the patient's imaging, laboratory findings and medical records.    Assessment    I have independently reviewed the patient's CT scans.  There is an enlarging right lower lobe nodule most consistent with a primary lung cancer.    Plan    I had a long discussion with her regarding the indications and risks of the various treatment options.  We reviewed the option of thoracotomy with wedge resection or lobectomy.  We also discussed the role of radiation therapy.  The patient is concerned about her underlying pulmonary status and her DLCO is only 58% of predicted which I believe would be appropriate for a lobectomy.  However she would like [place] to pursue a wedge resection if possible.  She understands that this may be an inferior operation overall but that there is no evidence of metastatic disease to any of the hilar or mediastinal nodes.  I reviewed with her in detail the indications and risks of the various options including that of radiation therapy.  She is concerned about this and would like to have surgery to remove the lesion if possible and that undergo a lobectomy if necessary.  I reviewed with her the risks of bleeding, infection, air leak and death.  Overall 5% operative mortality was quoted for lobectomy and a lesser mortality quoted for wedge resection.  She understands and would like to proceed.  We attempted to contact [facility] to see if her cataract surgery can be safely  performed next week followed by her lung surgery.  We are unable to reach them at this point.  We were able to obtain the echocardiogram and stress study from 2 weeks ago and those are normal.  Those were performed by Dr. Towanda Malkin.       Nestor Lewandowsky, MD 05/06/2018, 1:05 PM

## 2018-05-06 NOTE — Progress Notes (Signed)
Patient ID: Susan Houston, female   DOB: July 15, 1961, 57 y.o.   MRN: [id number]  Chief Complaint  Patient presents with  . New Patient (Initial Visit)    RLL pulmonary nodule    Referred By Dr. Lynett Fish Reason for Referral right lower lobe mass  HPI Location, Quality, Duration, Severity, Timing, Context, Modifying Factors, Associated Signs and Symptoms.  Susan Houston is a 57 y.o. female.  Her problems began about 10 years ago when she was diagnosed with laryngeal carcinoma which was treated with radiation therapy and chemotherapy.  She continued to smoke up until 2 years ago when she ultimately quit.  More recently she developed some gross hematuria and some abdominal pain and it was felt that she had a kidney stone.  In the emergency room a CT scan of the abdomen was done and this revealed a right lower lobe lung nodule.  A dedicated chest CT was then performed.  This revealed a 1.4 cm right lower lobe pulmonary nodule.  A PET scan was also performed revealing uptake in the nodule consistent with a malignancy.  She has not had a biopsy of this.  She did have pulmonary function studies.  Her DLCO was approximately 58% of predicted and her FEV1 is in the 70s.  She does not get short of breath.  She states that she is able to walk up a steps without any problems.  She denies any fevers, chills, hemoptysis.  She has not had any weight loss.   Past Medical History:  Diagnosis Date  . Anxiety   . Arthritis   . Asthma   . Back pain   . Bruises easily   . Cancer ([place]) 2009   LARYNX CANCER - CHEMO / RADIATION (NO SURGERY)   . Chronic leg pain    BILATERAL  . COPD (chronic obstructive pulmonary disease) ([place])   . Depression   . Difficulty sleeping   . Diverticulitis   . Facet syndrome, lumbar 03/28/2015  . Hemorrhoids   . History of kidney stones   . Hyperlipidemia   . Hypertension   . Incontinence of urine   . Laryngeal cancer ([place]) 04/22/2018  . Migraine   . Migraine without aura and without  status migrainosus, not intractable 09/11/2016  . Mild cognitive impairment 11/23/2017  . MRSA infection greater than 3 months ago    2008 right side of face  . Nerve damage    right leg  . Nodule of lower lobe of right lung 04/22/2018  . Wears dentures    full upper and lower    Past Surgical History:  Procedure Laterality Date  . ABDOMINAL HYSTERECTOMY  1983  . BLADDER SUSPENSION  2014  . CHOLECYSTECTOMY    . COLONOSCOPY    . ESOPHAGOGASTRODUODENOSCOPY    . ESOPHAGOGASTRODUODENOSCOPY (EGD) WITH PROPOFOL N/A 11/20/2016   Procedure: ESOPHAGOGASTRODUODENOSCOPY (EGD) WITH PROPOFOL;  Surgeon: Jonathon Bellows, MD;  Location: [facility] END[facility];  Service: Endoscopy;  Laterality: N/A;  . EXCISION NEUROMA Bilateral 09/24/2014   Procedure: BILATERAL OPEN SAPHENOUS NEURECTOMIES AND OPEN LEFT PERIPATELLA OSETOPHYTECTOMY;  Surgeon: Mauri Pole, MD;  Location: [facility];  Service: Orthopedics;  Laterality: Bilateral;  . JOINT REPLACEMENT  2009/2010   BIL TOTAL KNEES  . TONSILLECTOMY      Family History  Problem Relation Age of Onset  . Asthma Mother   . Diabetes Mother   . Hyperlipidemia Mother   . Hypertension Mother   . Cirrhosis Mother  Non-alcoholic  . Arthritis Father   . Cancer Father        Bone  . Hyperlipidemia Father   . Hypertension Father   . Anxiety disorder Sister   . Depression Sister   . Kidney Stones Son   . Stroke Maternal Grandmother   . Breast cancer Maternal Grandmother 55  . Cancer Maternal Grandfather        lung  . Pneumonia Paternal Grandmother   . Alzheimer's disease Paternal Grandfather   . Diabetes Sister   . Gout Sister   . Hypertension Sister   . Alcohol abuse Brother   . Heart disease Brother        massive MI    Social History Social History   Tobacco Use  . Smoking status: Former Smoker    Packs/day: 0.50    Types: Cigarettes    Last attempt to quit: 04/07/2016    Years since quitting: 2.0  . Smokeless tobacco: Never Used  . Tobacco  comment: patient has not smoked x 10 days.06/28/17 chantix  Substance Use Topics  . Alcohol use: No    Alcohol/week: 0.0 oz  . Drug use: No    Allergies  Allergen Reactions  . Lexapro [Escitalopram Oxalate] Other (See Comments)    Keeps awake for days.   . Morphine And Related Itching    Current Outpatient Medications  Medication Sig Dispense Refill  . aspirin 81 MG tablet Take 81 mg by mouth daily.    Marland Kitchen tiZANidine (ZANAFLEX) 4 MG capsule Take 4 mg by mouth 3 (three) times daily.    . traZODone (DESYREL) 100 MG tablet Take 100 mg by mouth at bedtime.     No current facility-administered medications for this visit.    Facility-Administered Medications Ordered in Other Visits  Medication Dose Route Frequency Provider Last Rate Last Dose  . ePHEDrine injection    Anesthesia Intra-op Darrin Nipper, MD   20 mg at 03/02/18 0830  . glycopyrrolate (ROBINUL) injection    Anesthesia Intra-op Darrin Nipper, MD   0.2 mg at 03/02/18 0830      Review of Systems A complete review of systems was asked and was negative except for the following positive findings she does have difficulty with her vision and.  She is scheduled to have cataract surgery next week.  Blood pressure (!) 160/86, pulse 68, temperature 98.3 F (36.8 C), temperature source Oral, resp. rate 18, height 5\' 2"  (1.575 m), weight 195 lb 9.6 oz (88.7 kg), SpO2 96 %.  Physical Exam CONSTITUTIONAL:  Pleasant, well-developed, well-nourished, and in no acute distress. EYES: Pupils equal and reactive to light, Sclera non-icteric EARS, NOSE, MOUTH AND THROAT:  The oropharynx was clear.  Dentition is absent.  Oral mucosa pink and moist. LYMPH NODES:  Lymph nodes in the neck and axillae were normal RESPIRATORY:  Lungs were clear.  Normal respiratory effort without pathologic use of accessory muscles of respiration CARDIOVASCULAR: Heart was regular without murmurs.  There were no carotid bruits. GI: The abdomen was soft, nontender,  and nondistended. There were no palpable masses. There was no hepatosplenomegaly. There were normal bowel sounds in all quadrants. GU:  Rectal deferred.   MUSCULOSKELETAL:  Normal muscle strength and tone.  No clubbing or cyanosis.   SKIN:  There were no pathologic skin lesions.  There were no nodules on palpation. NEUROLOGIC:  Sensation is normal.  Cranial nerves are grossly intact. PSYCH:  Oriented to person, place and time.  Mood and affect are normal.  Data Reviewed CT scans  I have personally reviewed the patient's imaging, laboratory findings and medical records.    Assessment    I have independently reviewed the patient's CT scans.  There is an enlarging right lower lobe nodule most consistent with a primary lung cancer.    Plan    I had a long discussion with her regarding the indications and risks of the various treatment options.  We reviewed the option of thoracotomy with wedge resection or lobectomy.  We also discussed the role of radiation therapy.  The patient is concerned about her underlying pulmonary status and her DLCO is only 58% of predicted which I believe would be appropriate for a lobectomy.  However she would like [place] to pursue a wedge resection if possible.  She understands that this may be an inferior operation overall but that there is no evidence of metastatic disease to any of the hilar or mediastinal nodes.  I reviewed with her in detail the indications and risks of the various options including that of radiation therapy.  She is concerned about this and would like to have surgery to remove the lesion if possible and that undergo a lobectomy if necessary.  I reviewed with her the risks of bleeding, infection, air leak and death.  Overall 5% operative mortality was quoted for lobectomy and a lesser mortality quoted for wedge resection.  She understands and would like to proceed.  We attempted to contact [facility] to see if her cataract surgery can be safely  performed next week followed by her lung surgery.  We are unable to reach them at this point.  We were able to obtain the echocardiogram and stress study from 2 weeks ago and those are normal.  Those were performed by Dr. Towanda Malkin.       Nestor Lewandowsky, MD 05/06/2018, 1:05 PM

## 2018-05-06 NOTE — Patient Instructions (Signed)
We  will call you with the surgery date once we have spoke with Dr.Chadwick Barrington @ [facility].  Please see your blue pre-care sheet for surgery information.

## 2018-05-09 ENCOUNTER — Telehealth: Payer: Self-pay | Admitting: Cardiothoracic Surgery

## 2018-05-09 ENCOUNTER — Telehealth: Payer: Self-pay

## 2018-05-09 NOTE — Telephone Encounter (Signed)
Patient is calling and has some questions regarding surgery please call patient and advise.

## 2018-05-09 NOTE — Telephone Encounter (Signed)
We have received patients cardiac clearance from Dr. Clayborn Bigness patient has been granted for surgery. clearance has been placed in patients chart.

## 2018-05-09 NOTE — Telephone Encounter (Signed)
Cardiac Clearance faxed to [facility] at this time.

## 2018-05-09 NOTE — Telephone Encounter (Signed)
Spoke with patient at this time. She is going to call her doctor at [facility] to ask if she will be able to have Lung surgery within 2 weeks of having eye surgery.

## 2018-05-10 ENCOUNTER — Telehealth: Payer: Self-pay | Admitting: Cardiothoracic Surgery

## 2018-05-10 NOTE — Telephone Encounter (Signed)
Pt advised of pre op date/time and sx date. Sx: 05/16/18 with Dr Oaks/[place]-thoracotomy with wedge resection.  Pre op: 05/12/18 @ 7:30am-office interview.   Patient made aware to call [phone], between 1-3:00pm the day before surgery, to find out what time to arrive.

## 2018-05-10 NOTE — Telephone Encounter (Signed)
Patient is calling asking when her surgery date is going to be please call patient and advise.

## 2018-05-11 ENCOUNTER — Ambulatory Visit: Payer: Medicare Other | Admitting: Anesthesiology

## 2018-05-11 ENCOUNTER — Encounter
Admission: RE | Disposition: A | Discharge status: Home / Self Care | Payer: Self-pay | Source: Ambulatory Visit | Attending: Ophthalmology

## 2018-05-11 ENCOUNTER — Ambulatory Visit
Admission: RE | Admit: 2018-05-11 | Discharge: 2018-05-11 | Disposition: A | Discharge status: Home / Self Care | Payer: Medicare Other | Source: Ambulatory Visit | Attending: Ophthalmology | Admitting: Ophthalmology

## 2018-05-11 DIAGNOSIS — Z7982 Long term (current) use of aspirin: Secondary | ICD-10-CM | POA: Diagnosis not present

## 2018-05-11 DIAGNOSIS — Z8614 Personal history of Methicillin resistant Staphylococcus aureus infection: Secondary | ICD-10-CM | POA: Insufficient documentation

## 2018-05-11 DIAGNOSIS — J449 Chronic obstructive pulmonary disease, unspecified: Secondary | ICD-10-CM | POA: Diagnosis not present

## 2018-05-11 DIAGNOSIS — Z8521 Personal history of malignant neoplasm of larynx: Secondary | ICD-10-CM | POA: Diagnosis not present

## 2018-05-11 DIAGNOSIS — H2512 Age-related nuclear cataract, left eye: Secondary | ICD-10-CM | POA: Insufficient documentation

## 2018-05-11 DIAGNOSIS — F329 Major depressive disorder, single episode, unspecified: Secondary | ICD-10-CM | POA: Insufficient documentation

## 2018-05-11 DIAGNOSIS — Z96653 Presence of artificial knee joint, bilateral: Secondary | ICD-10-CM | POA: Insufficient documentation

## 2018-05-11 DIAGNOSIS — Z87891 Personal history of nicotine dependence: Secondary | ICD-10-CM | POA: Diagnosis not present

## 2018-05-11 DIAGNOSIS — H25812 Combined forms of age-related cataract, left eye: Secondary | ICD-10-CM | POA: Diagnosis not present

## 2018-05-11 HISTORY — DX: Presence of dental prosthetic device (complete) (partial): Z97.2

## 2018-05-11 HISTORY — PX: EYE SURGERY: SHX253

## 2018-05-11 HISTORY — PX: CATARACT EXTRACTION W/PHACO: SHX586

## 2018-05-11 SURGERY — PHACOEMULSIFICATION, CATARACT, WITH IOL INSERTION
Anesthesia: Monitor Anesthesia Care | Site: Eye | Laterality: Left | Wound class: "Clean "

## 2018-05-11 MED ORDER — LACTATED RINGERS IV SOLN: INTRAVENOUS | Status: DC

## 2018-05-11 MED ORDER — BRIMONIDINE TARTRATE-TIMOLOL 0.2-0.5 % OP SOLN
OPHTHALMIC | Status: DC | PRN
  Administered 2018-05-11: 1 [drp] via OPHTHALMIC

## 2018-05-11 MED ORDER — CEFUROXIME OPHTHALMIC INJECTION 1 MG/0.1 ML
INJECTION | OPHTHALMIC | Status: DC | PRN
  Administered 2018-05-11: .3 mL via OPHTHALMIC

## 2018-05-11 MED ORDER — CYCLOPENTOLATE HCL 2 % OP SOLN
1.0000 [drp] | OPHTHALMIC | Status: DC | PRN
  Administered 2018-05-11 (×3): 1 [drp] via OPHTHALMIC

## 2018-05-11 MED ORDER — FENTANYL CITRATE (PF) 100 MCG/2ML IJ SOLN
INTRAMUSCULAR | Status: DC | PRN
  Administered 2018-05-11 (×2): 50 ug via INTRAVENOUS

## 2018-05-11 MED ORDER — PHENYLEPHRINE HCL 10 % OP SOLN
1.0000 [drp] | OPHTHALMIC | Status: DC | PRN
  Administered 2018-05-11 (×3): 1 [drp] via OPHTHALMIC

## 2018-05-11 MED ORDER — TETRACAINE HCL 0.5 % OP SOLN
1.0000 [drp] | OPHTHALMIC | Status: DC | PRN
  Administered 2018-05-11 (×2): 1 [drp] via OPHTHALMIC

## 2018-05-11 MED ORDER — NA HYALUR & NA CHOND-NA HYALUR 0.4-0.35 ML IO KIT
PACK | INTRAOCULAR | Status: DC | PRN
  Administered 2018-05-11: 1 mL via INTRAOCULAR

## 2018-05-11 MED ORDER — EPINEPHRINE PF 1 MG/ML IJ SOLN
INTRAOCULAR | Status: DC | PRN
  Administered 2018-05-11: 36 mL via OPHTHALMIC

## 2018-05-11 MED ORDER — MIDAZOLAM HCL 2 MG/2ML IJ SOLN
INTRAMUSCULAR | Status: DC | PRN
  Administered 2018-05-11 (×2): 1 mg via INTRAVENOUS

## 2018-05-11 MED ORDER — BALANCED SALT IO SOLN
INTRAOCULAR | Status: DC | PRN
  Administered 2018-05-11: 1 mL via INTRAMUSCULAR

## 2018-05-11 MED ORDER — MOXIFLOXACIN HCL 0.5 % OP SOLN
1.0000 [drp] | OPHTHALMIC | Status: DC | PRN
  Administered 2018-05-11 (×3): 1 [drp] via OPHTHALMIC

## 2018-05-11 SURGICAL SUPPLY — 27 items
CANNULA ANT/CHMB 27G (MISCELLANEOUS) ×1 IMPLANT
CANNULA ANT/CHMB 27GA (MISCELLANEOUS) ×3 IMPLANT
CARTRIDGE ABBOTT (MISCELLANEOUS) IMPLANT
GLOVE SURG LX 7.5 STRW (GLOVE) ×2
GLOVE SURG LX STRL 7.5 STRW (GLOVE) ×1 IMPLANT
GLOVE SURG TRIUMPH 8.0 PF LTX (GLOVE) ×3 IMPLANT
GOWN STRL REUS W/ TWL LRG LVL3 (GOWN DISPOSABLE) ×2 IMPLANT
GOWN STRL REUS W/TWL LRG LVL3 (GOWN DISPOSABLE) ×4
LENS IOL TECNIS ITEC 26.0 (Intraocular Lens) ×2 IMPLANT
MARKER SKIN DUAL TIP RULER LAB (MISCELLANEOUS) ×3 IMPLANT
NDL FILTER BLUNT 18X1 1/2 (NEEDLE) ×1 IMPLANT
NDL RETROBULBAR .5 NSTRL (NEEDLE) IMPLANT
NEEDLE FILTER BLUNT 18X 1/2SAF (NEEDLE) ×2
NEEDLE FILTER BLUNT 18X1 1/2 (NEEDLE) ×1 IMPLANT
PACK CATARACT BRASINGTON (MISCELLANEOUS) ×3 IMPLANT
PACK EYE AFTER SURG (MISCELLANEOUS) ×3 IMPLANT
PACK OPTHALMIC (MISCELLANEOUS) ×3 IMPLANT
RING MALYGIN 7.0 (MISCELLANEOUS) IMPLANT
SUT ETHILON 10-0 CS-B-6CS-B-6 (SUTURE)
SUT VICRYL  9 0 (SUTURE)
SUT VICRYL 9 0 (SUTURE) IMPLANT
SUTURE EHLN 10-0 CS-B-6CS-B-6 (SUTURE) IMPLANT
SYR 3ML LL SCALE MARK (SYRINGE) ×3 IMPLANT
SYR 5ML LL (SYRINGE) ×3 IMPLANT
SYR TB 1ML LUER SLIP (SYRINGE) ×3 IMPLANT
WATER STERILE IRR 500ML POUR (IV SOLUTION) ×3 IMPLANT
WIPE NON LINTING 3.25X3.25 (MISCELLANEOUS) ×3 IMPLANT

## 2018-05-11 NOTE — Op Note (Signed)
OPERATIVE NOTE  Susan Houston [id number] 05/11/2018   PREOPERATIVE DIAGNOSIS:  Nuclear sclerotic cataract left eye. H25.12   POSTOPERATIVE DIAGNOSIS:    Nuclear sclerotic cataract left eye.     PROCEDURE:  Phacoemusification with posterior chamber intraocular lens placement of the left eye   LENS:   Implant Name Type Inv. Item Serial No. Manufacturer Lot No. LRB No. Used  LENS IOL DIOP 26.0 - [id number] Intraocular Lens LENS IOL DIOP 26.0 [id number]  Left 1        ULTRASOUND TIME: 19  % of 0 minutes 27 seconds, CDE 5.1  SURGEON:  Wyonia Hough, MD   ANESTHESIA:  Topical with tetracaine drops and 2% Xylocaine jelly, augmented with 1% preservative-free intracameral lidocaine.    COMPLICATIONS:  None.   DESCRIPTION OF PROCEDURE:  The patient was identified in the holding room and transported to the operating room and placed in the supine position under the operating microscope.  The left eye was identified as the operative eye and it was prepped and draped in the usual sterile ophthalmic fashion.   A 1 millimeter clear-corneal paracentesis was made at the 1:30 position.  0.5 ml of preservative-free 1% lidocaine was injected into the anterior chamber.  The anterior chamber was filled with Viscoat viscoelastic.  A 2.4 millimeter keratome was used to make a near-clear corneal incision at the 10:30 position.  .  A curvilinear capsulorrhexis was made with a cystotome and capsulorrhexis forceps.  Balanced salt solution was used to hydrodissect and hydrodelineate the nucleus.   Phacoemulsification was then used in stop and chop fashion to remove the lens nucleus and epinucleus.  The remaining cortex was then removed using the irrigation and aspiration handpiece. Provisc was then placed into the capsular bag to distend it for lens placement.  A lens was then injected into the capsular bag.  The remaining viscoelastic was aspirated.   Wounds were hydrated with balanced salt solution.   The anterior chamber was inflated to a physiologic pressure with balanced salt solution.  No wound leaks were noted. Cefuroxime 0.1 ml of a 10mg /ml solution was injected into the anterior chamber for a dose of 1 mg of intracameral antibiotic at the completion of the case.   Timolol and Brimonidine drops were applied to the eye.  The patient was taken to the recovery room in stable condition without complications of anesthesia or surgery.  [name] 05/11/2018, 9:01 AM

## 2018-05-11 NOTE — Anesthesia Procedure Notes (Signed)
Procedure Name: [place] Performed by: Cameron Ali, CRNA Pre-anesthesia Checklist: Patient identified, Emergency Drugs available, Suction available, Timeout performed and Patient being monitored Patient Re-evaluated:Patient Re-evaluated prior to induction Oxygen Delivery Method: Nasal cannula Placement Confirmation: positive ETCO2

## 2018-05-11 NOTE — Anesthesia Postprocedure Evaluation (Signed)
Anesthesia Post Note  Patient: Susan Houston  Procedure(s) Performed: CATARACT EXTRACTION PHACO AND INTRAOCULAR LENS PLACEMENT (IOC) LEFT (Left Eye)  Patient location during evaluation: PACU Anesthesia Type: MAC Level of consciousness: awake and alert Pain management: pain level controlled Vital Signs Assessment: post-procedure vital signs reviewed and stable Respiratory status: spontaneous breathing Cardiovascular status: blood pressure returned to baseline Postop Assessment: no headache Anesthetic complications: no    Jaci Standard, III,  Lennart Gladish D

## 2018-05-11 NOTE — H&P (Signed)
The History and Physical notes are on paper, have been signed, and are to be scanned. The patient remains stable and unchanged from the H&P.   Previous H&P reviewed, patient examined, and changes noted: Pt has lung nodule and is scheduled for thoracotomy next week.   Mickle Campton 05/11/2018 7:42 AM

## 2018-05-11 NOTE — Transfer of Care (Signed)
Immediate Anesthesia Transfer of Care Note  Patient: Susan Houston  Procedure(s) Performed: CATARACT EXTRACTION PHACO AND INTRAOCULAR LENS PLACEMENT (IOC) LEFT (Left Eye)  Patient Location: PACU  Anesthesia Type: MAC  Level of Consciousness: awake, alert  and patient cooperative  Airway and Oxygen Therapy: Patient Spontanous Breathing and Patient connected to supplemental oxygen  Post-op Assessment: Post-op Vital signs reviewed, Patient's Cardiovascular Status Stable, Respiratory Function Stable, Patent Airway and No signs of Nausea or vomiting  Post-op Vital Signs: Reviewed and stable  Complications: No apparent anesthesia complications

## 2018-05-11 NOTE — Anesthesia Postprocedure Evaluation (Signed)
Anesthesia Post Note  Patient: Susan Houston  Procedure(s) Performed: CATARACT EXTRACTION PHACO AND INTRAOCULAR LENS PLACEMENT (IOC) LEFT (Left Eye)  Patient location during evaluation: PACU Anesthesia Type: MAC Level of consciousness: awake and alert Pain management: pain level controlled Vital Signs Assessment: post-procedure vital signs reviewed and stable Respiratory status: spontaneous breathing Cardiovascular status: blood pressure returned to baseline Postop Assessment: no headache Anesthetic complications: no    Draya Felker, III,  Luciana Cammarata D      

## 2018-05-12 ENCOUNTER — Encounter: Payer: Self-pay | Admitting: Family Medicine

## 2018-05-12 ENCOUNTER — Telehealth: Payer: Self-pay

## 2018-05-12 ENCOUNTER — Encounter
Admission: RE | Admit: 2018-05-12 | Discharge: 2018-05-12 | Disposition: A | Discharge status: Home / Self Care | Payer: Medicare Other | Source: Ambulatory Visit | Attending: Cardiothoracic Surgery | Admitting: Cardiothoracic Surgery

## 2018-05-12 ENCOUNTER — Ambulatory Visit
Admission: RE | Admit: 2018-05-12 | Discharge: 2018-05-12 | Disposition: A | Discharge status: Home / Self Care | Payer: Medicare Other | Source: Ambulatory Visit | Attending: Family Medicine | Admitting: Family Medicine

## 2018-05-12 ENCOUNTER — Other Ambulatory Visit: Payer: Self-pay

## 2018-05-12 ENCOUNTER — Ambulatory Visit
Admission: RE | Admit: 2018-05-12 | Discharge: 2018-05-12 | Disposition: A | Discharge status: Home / Self Care | Payer: Medicare Other | Source: Ambulatory Visit | Attending: Cardiothoracic Surgery | Admitting: Cardiothoracic Surgery

## 2018-05-12 DIAGNOSIS — M79621 Pain in right upper arm: Secondary | ICD-10-CM | POA: Insufficient documentation

## 2018-05-12 DIAGNOSIS — Z01818 Encounter for other preprocedural examination: Secondary | ICD-10-CM | POA: Insufficient documentation

## 2018-05-12 DIAGNOSIS — R0781 Pleurodynia: Secondary | ICD-10-CM | POA: Diagnosis not present

## 2018-05-12 DIAGNOSIS — Z01811 Encounter for preprocedural respiratory examination: Secondary | ICD-10-CM

## 2018-05-12 DIAGNOSIS — R918 Other nonspecific abnormal finding of lung field: Secondary | ICD-10-CM | POA: Diagnosis not present

## 2018-05-12 DIAGNOSIS — Z01810 Encounter for preprocedural cardiovascular examination: Secondary | ICD-10-CM | POA: Diagnosis not present

## 2018-05-12 HISTORY — DX: Cardiac arrhythmia, unspecified: I49.9

## 2018-05-12 HISTORY — DX: Anemia, unspecified: D64.9

## 2018-05-12 HISTORY — DX: Malignant neoplasm of unspecified part of unspecified bronchus or lung: C34.90

## 2018-05-12 LAB — CBC
HCT: 35.3 % (ref 35.0–47.0)
HEMOGLOBIN: 12 g/dL (ref 12.0–16.0)
MCH: 29.9 pg (ref 26.0–34.0)
MCHC: 34 g/dL (ref 32.0–36.0)
MCV: 88 fL (ref 80.0–100.0)
Platelets: 236 10*3/uL (ref 150–440)
RBC: 4.01 MIL/uL (ref 3.80–5.20)
RDW: 14 % (ref 11.5–14.5)
WBC: 5.9 10*3/uL (ref 3.6–11.0)

## 2018-05-12 LAB — URINALYSIS, ROUTINE W REFLEX MICROSCOPIC
BILIRUBIN URINE: NEGATIVE
GLUCOSE, UA: NEGATIVE mg/dL
HGB URINE DIPSTICK: NEGATIVE
Ketones, ur: NEGATIVE mg/dL
Leukocytes, UA: NEGATIVE
Nitrite: NEGATIVE
PROTEIN: NEGATIVE mg/dL
Specific Gravity, Urine: 1.009 (ref 1.005–1.030)
pH: 5 (ref 5.0–8.0)

## 2018-05-12 LAB — COMPREHENSIVE METABOLIC PANEL
ALBUMIN: 3.5 g/dL (ref 3.5–5.0)
ALT: 15 U/L (ref 0–44)
ANION GAP: 6 (ref 5–15)
AST: 19 U/L (ref 15–41)
Alkaline Phosphatase: 89 U/L (ref 38–126)
BUN: 14 mg/dL (ref 6–20)
CHLORIDE: 107 mmol/L (ref 98–111)
CO2: 29 mmol/L (ref 22–32)
Calcium: 8.8 mg/dL — ABNORMAL LOW (ref 8.9–10.3)
Creatinine, Ser: 0.88 mg/dL (ref 0.44–1.00)
GFR calc Af Amer: >60 mL/min (ref >60)
GFR calc non Af Amer: >60 mL/min (ref >60)
GLUCOSE: 72 mg/dL (ref 70–99)
Potassium: 3.9 mmol/L (ref 3.5–5.1)
SODIUM: 142 mmol/L (ref 135–145)
Total Bilirubin: 0.5 mg/dL (ref 0.3–1.2)
Total Protein: 7.1 g/dL (ref 6.5–8.1)

## 2018-05-12 LAB — PROTIME-INR
INR: 1.13
Prothrombin Time: 14.4 seconds (ref 11.4–15.2)

## 2018-05-12 LAB — SURGICAL PCR SCREEN
MRSA, PCR: NEGATIVE
Staphylococcus aureus: NEGATIVE

## 2018-05-12 LAB — APTT: APTT: 32 s (ref 24–36)

## 2018-05-12 NOTE — Patient Instructions (Signed)
Your procedure is scheduled on: Monday, May 16, 2018  Report to [facility].      DO NOT STOP ON THE FIRST FLOOR TO REGISTER  To find out your arrival time please call [phone] between 1PM - 3PM on Friday, May 13, 2018  Remember: Instructions that are not followed completely may result in serious medical risk,  up to and including death, or upon the discretion of your surgeon and anesthesiologist your  surgery may need to be rescheduled.     _X__ 1. Do not eat food after midnight the night before your procedure.                 No gum chewing or hard candies.NOTHING SOLID IN YOUR MOUTH AFTER MIDNIGHT                  You may drink clear liquids up to 2 hours before you are scheduled to arrive for your surgery-                  DO not drink clear liquids within 2 hours of the start of your surgery.                  Clear Liquids include:  water, apple juice without pulp, clear carbohydrate                 drink such as Clearfast of Gatorade, Black Coffee or Tea (Do not add                 anything to coffee or tea).NO DAIRY PRODUCTS, NO SOUP OR BROTH  __X__2.  On the morning of surgery brush your teeth with toothpaste and water,                    You may rinse your mouth with mouthwash if you wish.                          Do not swallow any toothpaste of mouthwash.     _X__ 3.  No Alcohol for 24 hours before or after surgery.   _X__ 4.  Do Not Smoke or use e-cigarettes For 24 Hours Prior to Your Surgery.                 Do not use any chewable tobacco products for at least 6 hours prior to                 surgery.  ____  5.  Bring all medications with you on the day of surgery if instructed.   ____  6.  Notify your doctor if there is any change in your medical condition      (cold, fever, infections).     Do not wear jewelry, make-up, hairpins, clips or nail polish. Do not wear lotions, powders, or perfumes. DO NOT wear  deodorant. Do not shave 48 hours prior to surgery. Men may shave face and neck. Do not bring valuables to the hospital.    [facility] is not responsible for any belongings or valuables.  Contacts, dentures or bridgework may not be worn into surgery. Leave your suitcase in the car. After surgery it may be brought to your room. For patients admitted to the hospital, discharge time is determined by your treatment team.   Patients discharged the day of surgery will not be allowed to drive home.  Please read over the following fact sheets that you were given:   PREPARING FOR SURGERY  ____ Take these medicines the morning of surgery with A SIP OF WATER:    1.NONE  2.   3.   4.  5.  6.  ____ Fleet Enema (as directed)   __X__ Use CHG Soap as directed  __X__ Stop ALL ASPIRIN PRODUCTS AS OF TODAY            THIS INCLUDES EXCEDRIN / BC POWDERS / GOODIES POWDERS  _X___ Stop Anti-inflammatories AS OF TODAY             THIS INCLUDES IBUPROFEN / MOTRIN / ALEVE / ADVIL                     YOU MAY TAKE TYLENOL AT ANY TIME   ____ Stop supplements until after surgery.    ____ Bring C-Pap to the hospital.   BRING THE INCENTIVE SPIROMETER BACK TO THE HOSPITAL WITH YOU  BRING YOUR MEDICAL DIRECTIVES, POA PAPERS SO WE MAY MAKE A COPY FOR YOUR CHART  WEAR COMFORTABLE CLOTHES AND HAVE STURDY SHOES / SLIPPERS TO [place]

## 2018-05-12 NOTE — Telephone Encounter (Signed)
Pre-Admit nurse called asking if patient could have her Chest X-Ray done today instead of tomorrow since the patient had a ride today and not tomorrow. I told them that she could have it done today.

## 2018-05-12 NOTE — Pre-Procedure Instructions (Addendum)
Spoke with Freda Munro at Dr. Genevive Bi' office requesting patient to have CXR completed today instead of having to return tomorrow. Patient does not drive and this would be much easier for her.  Also, John from cardiopulmonary attempted to get ABG but was unsuccessful and patient refused a second try. Explained the use of inspirometer to patient. She was able to accurately perform a return demonstration of its use and will bring back to hospital on day of surgery

## 2018-05-12 NOTE — Progress Notes (Signed)
Attempted ABG. refused after initial stick,needle withdrawn, nursing notified

## 2018-05-15 MED ORDER — CEFAZOLIN SODIUM-DEXTROSE 2-4 GM/100ML-% IV SOLN: 2.0000 g | INTRAVENOUS | Status: DC

## 2018-05-16 ENCOUNTER — Encounter
Admission: RE | Disposition: A | Discharge status: Home Health | Payer: Self-pay | Source: Home / Self Care | Attending: Cardiothoracic Surgery

## 2018-05-16 ENCOUNTER — Other Ambulatory Visit: Payer: Self-pay

## 2018-05-16 ENCOUNTER — Inpatient Hospital Stay: Payer: Medicare Other | Admitting: Anesthesiology

## 2018-05-16 ENCOUNTER — Inpatient Hospital Stay
Admission: RE | Admit: 2018-05-16 | Discharge: 2018-05-20 | DRG: 165 | Disposition: A | Discharge status: Home Health | Payer: Medicare Other | Attending: Cardiothoracic Surgery | Admitting: Cardiothoracic Surgery

## 2018-05-16 ENCOUNTER — Inpatient Hospital Stay: Payer: Medicare Other

## 2018-05-16 ENCOUNTER — Encounter: Payer: Self-pay | Admitting: *Deleted

## 2018-05-16 DIAGNOSIS — Z23 Encounter for immunization: Secondary | ICD-10-CM | POA: Diagnosis present

## 2018-05-16 DIAGNOSIS — Z90710 Acquired absence of both cervix and uterus: Secondary | ICD-10-CM | POA: Diagnosis not present

## 2018-05-16 DIAGNOSIS — C3431 Malignant neoplasm of lower lobe, right bronchus or lung: Secondary | ICD-10-CM | POA: Diagnosis not present

## 2018-05-16 DIAGNOSIS — Z96653 Presence of artificial knee joint, bilateral: Secondary | ICD-10-CM | POA: Diagnosis present

## 2018-05-16 DIAGNOSIS — Z6837 Body mass index (BMI) 37.0-37.9, adult: Secondary | ICD-10-CM

## 2018-05-16 DIAGNOSIS — Z87891 Personal history of nicotine dependence: Secondary | ICD-10-CM

## 2018-05-16 DIAGNOSIS — Z7982 Long term (current) use of aspirin: Secondary | ICD-10-CM

## 2018-05-16 DIAGNOSIS — R0789 Other chest pain: Secondary | ICD-10-CM | POA: Diagnosis not present

## 2018-05-16 DIAGNOSIS — R310 Gross hematuria: Secondary | ICD-10-CM | POA: Diagnosis present

## 2018-05-16 DIAGNOSIS — Z79899 Other long term (current) drug therapy: Secondary | ICD-10-CM

## 2018-05-16 DIAGNOSIS — G3184 Mild cognitive impairment, so stated: Secondary | ICD-10-CM | POA: Diagnosis present

## 2018-05-16 DIAGNOSIS — C3490 Malignant neoplasm of unspecified part of unspecified bronchus or lung: Secondary | ICD-10-CM | POA: Diagnosis not present

## 2018-05-16 DIAGNOSIS — Z888 Allergy status to other drugs, medicaments and biological substances status: Secondary | ICD-10-CM

## 2018-05-16 DIAGNOSIS — D649 Anemia, unspecified: Secondary | ICD-10-CM | POA: Diagnosis present

## 2018-05-16 DIAGNOSIS — Z8521 Personal history of malignant neoplasm of larynx: Secondary | ICD-10-CM | POA: Diagnosis not present

## 2018-05-16 DIAGNOSIS — Z885 Allergy status to narcotic agent status: Secondary | ICD-10-CM

## 2018-05-16 DIAGNOSIS — J9811 Atelectasis: Secondary | ICD-10-CM | POA: Diagnosis not present

## 2018-05-16 DIAGNOSIS — Z923 Personal history of irradiation: Secondary | ICD-10-CM

## 2018-05-16 DIAGNOSIS — E785 Hyperlipidemia, unspecified: Secondary | ICD-10-CM | POA: Diagnosis present

## 2018-05-16 DIAGNOSIS — J449 Chronic obstructive pulmonary disease, unspecified: Secondary | ICD-10-CM | POA: Diagnosis present

## 2018-05-16 DIAGNOSIS — T402X5A Adverse effect of other opioids, initial encounter: Secondary | ICD-10-CM | POA: Diagnosis not present

## 2018-05-16 DIAGNOSIS — Z9221 Personal history of antineoplastic chemotherapy: Secondary | ICD-10-CM

## 2018-05-16 DIAGNOSIS — R918 Other nonspecific abnormal finding of lung field: Secondary | ICD-10-CM

## 2018-05-16 DIAGNOSIS — Z09 Encounter for follow-up examination after completed treatment for conditions other than malignant neoplasm: Secondary | ICD-10-CM

## 2018-05-16 DIAGNOSIS — Z8249 Family history of ischemic heart disease and other diseases of the circulatory system: Secondary | ICD-10-CM

## 2018-05-16 DIAGNOSIS — L299 Pruritus, unspecified: Secondary | ICD-10-CM | POA: Diagnosis not present

## 2018-05-16 DIAGNOSIS — I10 Essential (primary) hypertension: Secondary | ICD-10-CM | POA: Diagnosis present

## 2018-05-16 DIAGNOSIS — E669 Obesity, unspecified: Secondary | ICD-10-CM | POA: Diagnosis present

## 2018-05-16 DIAGNOSIS — Z4682 Encounter for fitting and adjustment of non-vascular catheter: Secondary | ICD-10-CM | POA: Diagnosis not present

## 2018-05-16 DIAGNOSIS — R0902 Hypoxemia: Secondary | ICD-10-CM | POA: Diagnosis present

## 2018-05-16 HISTORY — PX: VIDEO BRONCHOSCOPY: SHX5072

## 2018-05-16 HISTORY — PX: THORACOTOMY/LOBECTOMY: SHX6116

## 2018-05-16 LAB — ABO/RH: ABO/RH(D): B NEG

## 2018-05-16 LAB — GLUCOSE, CAPILLARY: Glucose-Capillary: 101 mg/dL — ABNORMAL HIGH (ref 70–99)

## 2018-05-16 SURGERY — LOBECTOMY, LUNG, OPEN
Anesthesia: General | Laterality: Right | Wound class: "Clean Contaminated "

## 2018-05-16 MED ORDER — DEXAMETHASONE SODIUM PHOSPHATE 10 MG/ML IJ SOLN
INTRAMUSCULAR | Status: AC
Start: 2018-05-16 — End: ?
  Filled 2018-05-16: qty 1

## 2018-05-16 MED ORDER — PROPOFOL 10 MG/ML IV BOLUS
INTRAVENOUS | Status: AC
  Filled 2018-05-16: qty 20

## 2018-05-16 MED ORDER — SODIUM CHLORIDE 0.9 % IJ SOLN
INTRAMUSCULAR | Status: AC
  Filled 2018-05-16: qty 50

## 2018-05-16 MED ORDER — SODIUM CHLORIDE 0.9 % IV SOLN
INTRAVENOUS | Status: DC | PRN
  Administered 2018-05-16: 70 mL

## 2018-05-16 MED ORDER — KETOROLAC TROMETHAMINE 30 MG/ML IJ SOLN: 15.0000 mg | Freq: Three times a day (TID) | INTRAMUSCULAR | Status: DC | PRN

## 2018-05-16 MED ORDER — FAMOTIDINE 20 MG PO TABS
ORAL_TABLET | ORAL | Status: AC
  Filled 2018-05-16: qty 1

## 2018-05-16 MED ORDER — ONDANSETRON HCL 4 MG/2ML IJ SOLN
INTRAMUSCULAR | Status: DC | PRN
  Administered 2018-05-16: 4 mg via INTRAVENOUS

## 2018-05-16 MED ORDER — DIPHENHYDRAMINE HCL 50 MG/ML IJ SOLN
25.0000 mg | Freq: Once | INTRAMUSCULAR | Status: AC
  Administered 2018-05-16: 25 mg via INTRAVENOUS

## 2018-05-16 MED ORDER — PREDNISOLON-GATIFLOX-BROMFENAC 1-0.5-0.075 % OP SUSP
1.0000 [drp] | Freq: Four times a day (QID) | OPHTHALMIC | Status: AC
  Administered 2018-05-16 – 2018-05-17 (×6): 1 [drp] via OPHTHALMIC

## 2018-05-16 MED ORDER — PROMETHAZINE HCL 25 MG/ML IJ SOLN: 6.2500 mg | INTRAMUSCULAR | Status: DC | PRN

## 2018-05-16 MED ORDER — LIDOCAINE HCL (PF) 2 % IJ SOLN
INTRAMUSCULAR | Status: AC
  Filled 2018-05-16: qty 10

## 2018-05-16 MED ORDER — OXYCODONE HCL 5 MG/5ML PO SOLN: 5.0000 mg | Freq: Once | ORAL | Status: DC | PRN

## 2018-05-16 MED ORDER — BUPIVACAINE HCL (PF) 0.25 % IJ SOLN
INTRAMUSCULAR | Status: AC
  Filled 2018-05-16: qty 30

## 2018-05-16 MED ORDER — ONDANSETRON HCL 4 MG/2ML IJ SOLN
INTRAMUSCULAR | Status: AC
  Filled 2018-05-16: qty 2

## 2018-05-16 MED ORDER — DEXAMETHASONE SODIUM PHOSPHATE 10 MG/ML IJ SOLN
INTRAMUSCULAR | Status: DC | PRN
  Administered 2018-05-16: 10 mg via INTRAVENOUS

## 2018-05-16 MED ORDER — GLYCOPYRROLATE 0.2 MG/ML IJ SOLN
INTRAMUSCULAR | Status: AC
  Filled 2018-05-16: qty 1

## 2018-05-16 MED ORDER — CEFAZOLIN SODIUM-DEXTROSE 1-4 GM/50ML-% IV SOLN
1.0000 g | Freq: Three times a day (TID) | INTRAVENOUS | Status: AC
  Administered 2018-05-16 – 2018-05-17 (×3): 1 g via INTRAVENOUS
  Filled 2018-05-16 (×4): qty 50

## 2018-05-16 MED ORDER — ALBUTEROL SULFATE (2.5 MG/3ML) 0.083% IN NEBU
2.5000 mg | INHALATION_SOLUTION | RESPIRATORY_TRACT | Status: DC
  Administered 2018-05-16 – 2018-05-18 (×9): 2.5 mg via RESPIRATORY_TRACT
  Filled 2018-05-16 (×9): qty 3

## 2018-05-16 MED ORDER — BUPIVACAINE HCL 0.25 % IJ SOLN
INTRAMUSCULAR | Status: DC | PRN
  Administered 2018-05-16: 30 mL

## 2018-05-16 MED ORDER — OXYCODONE-ACETAMINOPHEN 5-325 MG PO TABS
1.0000 | ORAL_TABLET | ORAL | Status: DC | PRN
  Administered 2018-05-16 – 2018-05-20 (×16): 2 via ORAL
  Filled 2018-05-16 (×17): qty 2

## 2018-05-16 MED ORDER — FENTANYL CITRATE (PF) 100 MCG/2ML IJ SOLN
INTRAMUSCULAR | Status: AC
  Filled 2018-05-16: qty 2

## 2018-05-16 MED ORDER — PHENYLEPHRINE HCL 10 MG/ML IJ SOLN
INTRAMUSCULAR | Status: AC
  Filled 2018-05-16: qty 1

## 2018-05-16 MED ORDER — FENTANYL CITRATE (PF) 100 MCG/2ML IJ SOLN
25.0000 ug | INTRAMUSCULAR | Status: DC | PRN
  Administered 2018-05-16: 50 ug via INTRAVENOUS
  Administered 2018-05-16: 25 ug via INTRAVENOUS
  Administered 2018-05-16: 50 ug via INTRAVENOUS
  Administered 2018-05-16: 25 ug via INTRAVENOUS

## 2018-05-16 MED ORDER — CEFAZOLIN SODIUM-DEXTROSE 2-3 GM-%(50ML) IV SOLR
INTRAVENOUS | Status: DC | PRN
  Administered 2018-05-16: 2 g via INTRAVENOUS

## 2018-05-16 MED ORDER — LIDOCAINE HCL (CARDIAC) PF 100 MG/5ML IV SOSY
PREFILLED_SYRINGE | INTRAVENOUS | Status: DC | PRN
  Administered 2018-05-16: 80 mg via INTRAVENOUS

## 2018-05-16 MED ORDER — SUCCINYLCHOLINE CHLORIDE 20 MG/ML IJ SOLN
INTRAMUSCULAR | Status: AC
  Filled 2018-05-16: qty 1

## 2018-05-16 MED ORDER — PREDNISOLON-GATIFLOX-BROMFENAC 1-0.5-0.075 % OP SUSP: 1.0000 [drp] | Freq: Every day | OPHTHALMIC | Status: DC

## 2018-05-16 MED ORDER — MEPERIDINE HCL 50 MG/ML IJ SOLN: 6.2500 mg | INTRAMUSCULAR | Status: DC | PRN

## 2018-05-16 MED ORDER — BISACODYL 5 MG PO TBEC
10.0000 mg | DELAYED_RELEASE_TABLET | Freq: Every day | ORAL | Status: DC
  Administered 2018-05-16 – 2018-05-20 (×5): 10 mg via ORAL
  Filled 2018-05-16 (×5): qty 2

## 2018-05-16 MED ORDER — TRAMADOL HCL 50 MG PO TABS
50.0000 mg | ORAL_TABLET | Freq: Four times a day (QID) | ORAL | Status: DC
  Administered 2018-05-16 – 2018-05-17 (×4): 100 mg via ORAL
  Filled 2018-05-16: qty 2
  Filled 2018-05-16: qty 1
  Filled 2018-05-16 (×2): qty 2
  Filled 2018-05-16: qty 1

## 2018-05-16 MED ORDER — FAMOTIDINE 20 MG PO TABS
20.0000 mg | ORAL_TABLET | Freq: Once | ORAL | Status: AC
  Administered 2018-05-16: 20 mg via ORAL

## 2018-05-16 MED ORDER — FENTANYL CITRATE (PF) 100 MCG/2ML IJ SOLN
INTRAMUSCULAR | Status: DC | PRN
  Administered 2018-05-16 (×2): 50 ug via INTRAVENOUS

## 2018-05-16 MED ORDER — MIDAZOLAM HCL 2 MG/2ML IJ SOLN
INTRAMUSCULAR | Status: AC
  Filled 2018-05-16: qty 2

## 2018-05-16 MED ORDER — SUGAMMADEX SODIUM 200 MG/2ML IV SOLN
INTRAVENOUS | Status: DC | PRN
  Administered 2018-05-16: 200 mg via INTRAVENOUS

## 2018-05-16 MED ORDER — KETOROLAC TROMETHAMINE 15 MG/ML IJ SOLN
INTRAMUSCULAR | Status: AC
  Administered 2018-05-16: 15 mg via INTRAVENOUS
  Filled 2018-05-16: qty 1

## 2018-05-16 MED ORDER — FENTANYL CITRATE (PF) 100 MCG/2ML IJ SOLN
INTRAMUSCULAR | Status: AC
  Administered 2018-05-16: 50 ug via INTRAVENOUS
  Filled 2018-05-16: qty 2

## 2018-05-16 MED ORDER — DIPHENHYDRAMINE HCL 50 MG/ML IJ SOLN
25.0000 mg | Freq: Four times a day (QID) | INTRAMUSCULAR | Status: DC | PRN
  Administered 2018-05-16 – 2018-05-20 (×8): 25 mg via INTRAVENOUS
  Filled 2018-05-16 (×9): qty 1

## 2018-05-16 MED ORDER — ROCURONIUM BROMIDE 100 MG/10ML IV SOLN
INTRAVENOUS | Status: AC
  Filled 2018-05-16: qty 1

## 2018-05-16 MED ORDER — ACETAMINOPHEN 10 MG/ML IV SOLN
INTRAVENOUS | Status: DC | PRN
  Administered 2018-05-16: 1000 mg via INTRAVENOUS

## 2018-05-16 MED ORDER — FENTANYL CITRATE (PF) 100 MCG/2ML IJ SOLN
50.0000 ug | INTRAMUSCULAR | Status: DC | PRN
  Administered 2018-05-16 – 2018-05-17 (×8): 50 ug via INTRAVENOUS
  Filled 2018-05-16 (×8): qty 2

## 2018-05-16 MED ORDER — CEFAZOLIN SODIUM-DEXTROSE 2-4 GM/100ML-% IV SOLN
INTRAVENOUS | Status: AC
  Filled 2018-05-16: qty 100

## 2018-05-16 MED ORDER — BUPIVACAINE LIPOSOME 1.3 % IJ SUSP
INTRAMUSCULAR | Status: AC
  Filled 2018-05-16: qty 20

## 2018-05-16 MED ORDER — MIDAZOLAM HCL 2 MG/2ML IJ SOLN
INTRAMUSCULAR | Status: DC | PRN
  Administered 2018-05-16: 2 mg via INTRAVENOUS

## 2018-05-16 MED ORDER — SUGAMMADEX SODIUM 200 MG/2ML IV SOLN
INTRAVENOUS | Status: AC
  Filled 2018-05-16: qty 2

## 2018-05-16 MED ORDER — LACTATED RINGERS IV SOLN
INTRAVENOUS | Status: DC
  Administered 2018-05-16: 07:00:00 via INTRAVENOUS

## 2018-05-16 MED ORDER — KETOROLAC TROMETHAMINE 15 MG/ML IJ SOLN
15.0000 mg | Freq: Once | INTRAMUSCULAR | Status: AC
  Administered 2018-05-16: 15 mg via INTRAVENOUS

## 2018-05-16 MED ORDER — MORPHINE SULFATE (PF) 2 MG/ML IV SOLN
1.0000 mg | INTRAVENOUS | Status: DC | PRN
  Administered 2018-05-16: 2 mg via INTRAVENOUS
  Filled 2018-05-16: qty 1

## 2018-05-16 MED ORDER — DIPHENHYDRAMINE HCL 50 MG/ML IJ SOLN
25.0000 mg | Freq: Once | INTRAMUSCULAR | Status: AC
  Administered 2018-05-16: 25 mg via INTRAVENOUS
  Filled 2018-05-16: qty 1

## 2018-05-16 MED ORDER — ROCURONIUM BROMIDE 100 MG/10ML IV SOLN
INTRAVENOUS | Status: DC | PRN
  Administered 2018-05-16: 45 mg via INTRAVENOUS
  Administered 2018-05-16: 5 mg via INTRAVENOUS
  Administered 2018-05-16: 10 mg via INTRAVENOUS

## 2018-05-16 MED ORDER — PNEUMOCOCCAL VAC POLYVALENT 25 MCG/0.5ML IJ INJ
0.5000 mL | INJECTION | INTRAMUSCULAR | Status: AC
  Administered 2018-05-17: 0.5 mL via INTRAMUSCULAR
  Filled 2018-05-16: qty 0.5

## 2018-05-16 MED ORDER — HYDROMORPHONE HCL 1 MG/ML IJ SOLN
0.5000 mg | INTRAMUSCULAR | Status: DC | PRN
  Administered 2018-05-16: 0.5 mg via INTRAVENOUS

## 2018-05-16 MED ORDER — DIPHENHYDRAMINE HCL 50 MG/ML IJ SOLN
INTRAMUSCULAR | Status: AC
  Filled 2018-05-16: qty 1

## 2018-05-16 MED ORDER — PREDNISOLON-GATIFLOX-BROMFENAC 1-0.5-0.075 % OP SUSP
1.0000 [drp] | Freq: Two times a day (BID) | OPHTHALMIC | Status: DC
  Administered 2018-05-18 – 2018-05-20 (×5): 1 [drp] via OPHTHALMIC

## 2018-05-16 MED ORDER — KCL IN DEXTROSE-NACL 20-5-0.45 MEQ/L-%-% IV SOLN
INTRAVENOUS | Status: DC
  Administered 2018-05-16: 12:00:00 via INTRAVENOUS
  Filled 2018-05-16 (×3): qty 1000

## 2018-05-16 MED ORDER — HYDROMORPHONE HCL 1 MG/ML IJ SOLN
INTRAMUSCULAR | Status: AC
  Administered 2018-05-16: 0.5 mg via INTRAVENOUS
  Filled 2018-05-16: qty 1

## 2018-05-16 MED ORDER — ACETAMINOPHEN 10 MG/ML IV SOLN
INTRAVENOUS | Status: AC
  Filled 2018-05-16: qty 100

## 2018-05-16 MED ORDER — PROPOFOL 10 MG/ML IV BOLUS
INTRAVENOUS | Status: DC | PRN
  Administered 2018-05-16: 120 mg via INTRAVENOUS

## 2018-05-16 MED ORDER — ONDANSETRON HCL 4 MG/2ML IJ SOLN
4.0000 mg | Freq: Four times a day (QID) | INTRAMUSCULAR | Status: DC | PRN
  Administered 2018-05-19: 4 mg via INTRAVENOUS
  Filled 2018-05-16: qty 2

## 2018-05-16 MED ORDER — TRAZODONE HCL 50 MG PO TABS
25.0000 mg | ORAL_TABLET | Freq: Every day | ORAL | Status: DC
  Administered 2018-05-16 – 2018-05-19 (×4): 25 mg via ORAL
  Filled 2018-05-16 (×5): qty 1

## 2018-05-16 MED ORDER — FENTANYL CITRATE (PF) 100 MCG/2ML IJ SOLN
INTRAMUSCULAR | Status: AC
  Administered 2018-05-16: 25 ug via INTRAVENOUS
  Filled 2018-05-16: qty 2

## 2018-05-16 MED ORDER — OXYCODONE HCL 5 MG PO TABS: 5.0000 mg | ORAL_TABLET | Freq: Once | ORAL | Status: DC | PRN

## 2018-05-16 SURGICAL SUPPLY — 67 items
BENZOIN TINCTURE PRP APPL 2/3 (GAUZE/BANDAGES/DRESSINGS) ×3 IMPLANT
BNDG COHESIVE 4X5 TAN STRL (GAUZE/BANDAGES/DRESSINGS) IMPLANT
BRONCHOSCOPE PED SLIM DISP (MISCELLANEOUS) ×3 IMPLANT
CANISTER SUCT 1200ML W/VALVE (MISCELLANEOUS) ×3 IMPLANT
CATH THOR STR 28F  SOFT WA (CATHETERS) ×1
CATH THOR STR 28F SOFT WA (CATHETERS) IMPLANT
CATH URET ROBINSON 16FR STRL (CATHETERS) ×3 IMPLANT
CHLORAPREP W/TINT 26ML (MISCELLANEOUS) ×6 IMPLANT
CNTNR SPEC 2.5X3XGRAD LEK (MISCELLANEOUS) ×8
CONT SPEC 4OZ STER OR WHT (MISCELLANEOUS) ×4
CONTAINER SPEC 2.5X3XGRAD LEK (MISCELLANEOUS) ×8 IMPLANT
CUTTER ECHEON FLEX ENDO 45 340 (ENDOMECHANICALS) ×1 IMPLANT
DRAIN CHEST DRY SUCT SGL (MISCELLANEOUS) ×3 IMPLANT
DRAPE C-SECTION (MISCELLANEOUS) ×3 IMPLANT
DRAPE MAG INST 16X20 L/F (DRAPES) ×3 IMPLANT
DRSG OPSITE POSTOP 4X10 (GAUZE/BANDAGES/DRESSINGS) ×1 IMPLANT
DRSG OPSITE POSTOP 4X6 (GAUZE/BANDAGES/DRESSINGS) ×3 IMPLANT
DRSG OPSITE POSTOP 4X8 (GAUZE/BANDAGES/DRESSINGS) ×3 IMPLANT
DRSG TELFA 3X8 NADH (GAUZE/BANDAGES/DRESSINGS) ×3 IMPLANT
ELECT BLADE 6.5 EXT (BLADE) ×3 IMPLANT
ELECT CAUTERY BLADE TIP 2.5 (TIP) ×3
ELECT REM PT RETURN 9FT ADLT (ELECTROSURGICAL) ×3
ELECTRODE CAUTERY BLDE TIP 2.5 (TIP) ×2 IMPLANT
ELECTRODE REM PT RTRN 9FT ADLT (ELECTROSURGICAL) ×2 IMPLANT
GAUZE SPONGE 4X4 12PLY STRL (GAUZE/BANDAGES/DRESSINGS) ×4 IMPLANT
GLOVE SURG SYN 7.5  E (GLOVE) ×2
GLOVE SURG SYN 7.5 E (GLOVE) ×4 IMPLANT
GLOVE SURG SYN 7.5 PF PI (GLOVE) ×4 IMPLANT
GOWN STRL REUS W/ TWL LRG LVL3 (GOWN DISPOSABLE) ×6 IMPLANT
GOWN STRL REUS W/TWL LRG LVL3 (GOWN DISPOSABLE) ×3
KIT TURNOVER KIT A (KITS) ×3 IMPLANT
LABEL OR SOLS (LABEL) ×3 IMPLANT
LOOP RED MAXI  1X406MM (MISCELLANEOUS) ×1
LOOP VESSEL MAXI 1X406 RED (MISCELLANEOUS) ×2 IMPLANT
MARKER SKIN DUAL TIP RULER LAB (MISCELLANEOUS) ×3 IMPLANT
NDL SPNL 20GX3.5 QUINCKE YW (NEEDLE) IMPLANT
NEEDLE SPNL 20GX3.5 QUINCKE YW (NEEDLE) ×3 IMPLANT
PACK BASIN MAJOR ARMC (MISCELLANEOUS) ×3 IMPLANT
PAD DRESSING TELFA 3X8 NADH (GAUZE/BANDAGES/DRESSINGS) ×2 IMPLANT
RELOAD STAPLE 45 GOLD REG/THCK (STAPLE) IMPLANT
RELOAD STAPLER LINE PROX 30 GR (STAPLE) ×2 IMPLANT
SPONGE KITTNER 5P (MISCELLANEOUS) ×3 IMPLANT
STAPLE RELOAD 45MM GOLD (STAPLE) ×9 IMPLANT
STAPLER RELOAD LINE PROX 30 GR (STAPLE) ×3
STAPLER RELOADABLE 30 GRN THCK (STAPLE) ×2 IMPLANT
STAPLER SKIN PROX 35W (STAPLE) ×3 IMPLANT
STAPLER VASCULAR ECHELON 35 (CUTTER) IMPLANT
STRIP CLOSURE SKIN 1/2X4 (GAUZE/BANDAGES/DRESSINGS) ×3 IMPLANT
SUT MNCRL AB 3-0 PS2 27 (SUTURE) IMPLANT
SUT PROLENE 5 0 RB 1 DA (SUTURE) IMPLANT
SUT SILK 0 (SUTURE) ×1
SUT SILK 0 30XBRD TIE 6 (SUTURE) ×2 IMPLANT
SUT SILK 1 SH (SUTURE) ×21 IMPLANT
SUT VIC AB 0 CT1 36 (SUTURE) ×6 IMPLANT
SUT VIC AB 2-0 CT1 27 (SUTURE) ×2
SUT VIC AB 2-0 CT1 TAPERPNT 27 (SUTURE) ×4 IMPLANT
SUT VICRYL 2 TP 1 (SUTURE) ×9 IMPLANT
SYR 10ML SLIP (SYRINGE) ×3 IMPLANT
SYR BULB IRRIG 60ML STRL (SYRINGE) ×3 IMPLANT
TAPE ADH 3 LX (MISCELLANEOUS) ×3 IMPLANT
TAPE TRANSPORE STRL 2 31045 (GAUZE/BANDAGES/DRESSINGS) IMPLANT
TRAY FOLEY MTR SLVR 16FR STAT (SET/KITS/TRAYS/PACK) ×3 IMPLANT
TROCAR FLEXIPATH 20X80 (ENDOMECHANICALS) IMPLANT
TROCAR FLEXIPATH THORACIC 15MM (ENDOMECHANICALS) IMPLANT
TUBING CONNECTING 10 (TUBING) ×3 IMPLANT
WATER STERILE IRR 1000ML POUR (IV SOLUTION) ×3 IMPLANT
YANKAUER SUCT BULB TIP FLEX NO (MISCELLANEOUS) ×3 IMPLANT

## 2018-05-16 NOTE — Anesthesia Procedure Notes (Addendum)
Procedure Name: Intubation Date/Time: 05/16/2018 7:32 AM Performed by: Christain Sacramento, RN Pre-anesthesia Checklist: Patient identified, Patient being monitored, Timeout performed, Emergency Drugs available and Suction available Patient Re-evaluated:Patient Re-evaluated prior to induction Oxygen Delivery Method: Circle system utilized Preoxygenation: Pre-oxygenation with 100% oxygen Induction Type: IV induction Ventilation: Mask ventilation without difficulty and Mask ventilation throughout procedure Laryngoscope Size: 3 and McGraph Grade View: Grade I Tube type: Oral Endobronchial tube: Left and 37 Fr Number of attempts: 1 Airway Equipment and Method: Stylet and Fiberoptic brochoscope Placement Confirmation: positive ETCO2,  ETT inserted through vocal cords under direct vision and breath sounds checked- equal and bilateral Secured at: 27 cm Tube secured with: Tape Dental Injury: Teeth and Oropharynx as per pre-operative assessment

## 2018-05-16 NOTE — Anesthesia Preprocedure Evaluation (Signed)
Anesthesia Evaluation  Patient identified by MRN, date of birth, ID band Patient awake    Reviewed: Allergy & Precautions, NPO status , Patient's Chart, lab work & pertinent test results  History of Anesthesia Complications Negative for: history of anesthetic complications  Airway Mallampati: II  TM Distance: >3 FB Neck ROM: Full    Dental  (+) Edentulous Upper, Edentulous Lower   Pulmonary asthma , COPD, former smoker,    breath sounds clear to auscultation- rhonchi (-) wheezing      Cardiovascular hypertension, (-) CAD, (-) Past MI, (-) Cardiac Stents and (-) CABG  Rhythm:Regular Rate:Normal - Systolic murmurs and - Diastolic murmurs    Neuro/Psych  Headaches, PSYCHIATRIC DISORDERS Anxiety Depression    GI/Hepatic negative GI ROS, Neg liver ROS,   Endo/Other  negative endocrine ROSneg diabetes  Renal/GU negative Renal ROS     Musculoskeletal  (+) Arthritis ,   Abdominal (+) + obese,   Peds  Hematology  (+) anemia ,   Anesthesia Other Findings Past Medical History: No date: Anemia     Comment:  vitamin b12 deficiency. no longer taking supplements No date: Anxiety No date: Arthritis No date: Asthma No date: Back pain No date: Bruises easily 2009: Cancer ([place])     Comment:  LARYNX CANCER - CHEMO / RADIATION (NO SURGERY)  No date: Chronic leg pain     Comment:  BILATERAL No date: COPD (chronic obstructive pulmonary disease) (HCC)     Comment:  no inhalers for over 1 year No date: Depression No date: Difficulty sleeping No date: Diverticulitis 04/2018: Dysrhythmia     Comment:  brady episodes. cleared by dr. Clayborn Bigness 03/28/2015: Facet syndrome, lumbar No date: Hemorrhoids 04/2018: History of kidney stones     Comment:  recently passed stone No date: Hyperlipidemia 2019: Hypertension     Comment:  not on any treatment currently No date: Incontinence of urine 2009: Laryngeal cancer ([place]) 04/2018: Lung  cancer ([place])     Comment:  diagnosed via ct scan No date: Migraine 09/11/2016: Migraine without aura and without status migrainosus, not  intractable 11/23/2017: Mild cognitive impairment No date: MRSA infection greater than 3 months ago     Comment:  2008 right side of face No date: Nerve damage     Comment:  right leg. thinks this is from her TKR 04/22/2018: Nodule of lower lobe of right lung No date: Wears dentures     Comment:  full upper and lower   Reproductive/Obstetrics                             Anesthesia Physical Anesthesia Plan  ASA: III  Anesthesia Plan: General   Post-op Pain Management:    Induction: Intravenous  PONV Risk Score and Plan: 2 and Ondansetron, Dexamethasone and Midazolam  Airway Management Planned: Oral ETT and Double Lumen EBT  Additional Equipment:   Intra-op Plan:   Post-operative Plan: Extubation in OR and Possible Post-op intubation/ventilation  Informed Consent: I have reviewed the patients History and Physical, chart, labs and discussed the procedure including the risks, benefits and alternatives for the proposed anesthesia with the patient or authorized representative who has indicated his/her understanding and acceptance.   Dental advisory given  Plan Discussed with: CRNA and Anesthesiologist  Anesthesia Plan Comments: (Discussed possible arterial line placement if lobectomy is required and possible post op ventilation )        Anesthesia Quick Evaluation

## 2018-05-16 NOTE — Interval H&P Note (Signed)
History and Physical Interval Note:  05/16/2018 7:16 AM  Susan Houston  has presented today for surgery, with the diagnosis of MASS RIGHT LOWER LOBE  The various methods of treatment have been discussed with the patient and family. After consideration of risks, benefits and other options for treatment, the patient has consented to  Procedure(s): THORACOTOMY/POSSIBLE LOBECTOMY (Right) PREOP  BRONCHOSCOPY (N/A) as a surgical intervention .  The patient's history has been reviewed, patient examined, no change in status, stable for surgery.  I have reviewed the patient's chart and labs.  Questions were answered to the patient's satisfaction.     Nestor Lewandowsky

## 2018-05-16 NOTE — Anesthesia Post-op Follow-up Note (Signed)
Anesthesia QCDR form completed.        

## 2018-05-16 NOTE — Progress Notes (Signed)
Dr. Randa Lynn notified that pt still having pain 6/10 after 150 mcg of Fentanyl. Acknowledged. Orders received. [name] E 10:51 AM 05/16/2018

## 2018-05-16 NOTE — Consult Note (Signed)
[facility] Consultation      Name: Susan Houston MRN: [id number] DOB: 06/04/1961    ADMISSION DATE:  05/16/2018 CONSULTATION DATE:  05/16/2018  REFERRING MD :  Genevive Bi   CHIEF COMPLAINT:    RLL lung mass   HISTORY OF PRESENT ILLNESS   57 year old female with history of laryngeal carcinoma, hypertension, hyperlipidemia, diverticulitis, and COPD who is s/p right thoracotomy with wedge resection for RLL malignancy. Procedure without complication. Patient is followed by Dr. Rogue Bussing and Dr. Genevive Bi. Patient alert upon transfer to ICU, but states she is still drowsy. Patient is complaining of right sided chest pain along with itching "all over". Otherwise patient resting comfortably in hospital bed.          PAST MEDICAL HISTORY    :  Past Medical History:  Diagnosis Date  . Anemia    vitamin b12 deficiency. no longer taking supplements  . Anxiety   . Arthritis   . Asthma   . Back pain   . Bruises easily   . Cancer ([place]) 2009   LARYNX CANCER - CHEMO / RADIATION (NO SURGERY)   . Chronic leg pain    BILATERAL  . COPD (chronic obstructive pulmonary disease) (HCC)    no inhalers for over 1 year  . Depression   . Difficulty sleeping   . Diverticulitis   . Dysrhythmia 04/2018   brady episodes. cleared by dr. Clayborn Bigness  . Facet syndrome, lumbar 03/28/2015  . Hemorrhoids   . History of kidney stones 04/2018   recently passed stone  . Hyperlipidemia   . Hypertension 2019   not on any treatment currently  . Incontinence of urine   . Laryngeal cancer ([place]) 2009  . Lung cancer ([place]) 04/2018   diagnosed via ct scan  . Migraine   . Migraine without aura and without status migrainosus, not intractable 09/11/2016  . Mild cognitive impairment 11/23/2017  . MRSA infection greater than 3 months ago    2008 right side of face  . Nerve damage    right leg. thinks this is from her TKR  . Nodule of lower lobe of right lung 04/22/2018  . Wears dentures    full upper  and lower   Past Surgical History:  Procedure Laterality Date  . ABDOMINAL HYSTERECTOMY  1983  . BLADDER SUSPENSION  2014  . CATARACT EXTRACTION W/PHACO Left 05/11/2018   Procedure: CATARACT EXTRACTION PHACO AND INTRAOCULAR LENS PLACEMENT ([place]) LEFT;  Surgeon: Leandrew Koyanagi, MD;  Location: [facility];  Service: Ophthalmology;  Laterality: Left;  PREFERS EARLY  . CHOLECYSTECTOMY  1998  . COLONOSCOPY    . ESOPHAGOGASTRODUODENOSCOPY  2015  . ESOPHAGOGASTRODUODENOSCOPY (EGD) WITH PROPOFOL N/A 11/20/2016   Procedure: ESOPHAGOGASTRODUODENOSCOPY (EGD) WITH PROPOFOL;  Surgeon: Jonathon Bellows, MD;  Location: [facility];  Service: Endoscopy;  Laterality: N/A;  . EXCISION NEUROMA Bilateral 09/24/2014   Procedure: BILATERAL OPEN SAPHENOUS NEURECTOMIES AND OPEN LEFT PERIPATELLA OSETOPHYTECTOMY;  Surgeon: Mauri Pole, MD;  Location: [facility];  Service: Orthopedics;  Laterality: Bilateral;  . EYE SURGERY Left 05/11/2018   cataract extraction  . JOINT REPLACEMENT  2009/2010   BIL TOTAL KNEES  . TONSILLECTOMY     Prior to Admission medications   Medication Sig Start Date End Date Taking? Authorizing Provider  aspirin 81 MG tablet Take 81 mg by mouth daily.   Yes [provider]  tiZANidine (ZANAFLEX) 4 MG capsule Take 2 mg by mouth at bedtime.    Yes [provider]  traZODone (DESYREL) 50 MG tablet Take 25 mg by mouth at bedtime.    Yes [provider]   Allergies  Allergen Reactions  . Morphine And Related Itching  . Adhesive [Tape] Other (See Comments)    Plastic tape rips skin and bruises her. PLEASE USE PAPER TAPE  . Lexapro [Escitalopram Oxalate] Other (See Comments)    Keeps awake for days.      FAMILY HISTORY   Family History  Problem Relation Age of Onset  . Asthma Mother   . Diabetes Mother   . Hyperlipidemia Mother   . Hypertension Mother   . Cirrhosis Mother        Non-alcoholic  . Arthritis Father   . Cancer Father        Bone  .  Hyperlipidemia Father   . Hypertension Father   . Anxiety disorder Sister   . Depression Sister   . Kidney Stones Son   . Stroke Maternal Grandmother   . Breast cancer Maternal Grandmother 41  . Cancer Maternal Grandfather        lung  . Pneumonia Paternal Grandmother   . Alzheimer's disease Paternal Grandfather   . Diabetes Sister   . Gout Sister   . Hypertension Sister   . Alcohol abuse Brother   . Heart disease Brother        massive MI      SOCIAL HISTORY    reports that she quit smoking about 2 years ago. Her smoking use included cigarettes. She smoked 0.50 packs per day. She has never used smokeless tobacco. She reports that she does not drink alcohol or use drugs.  Review of Systems  Constitutional: Negative for chills and fever.  HENT: Negative for congestion and sore throat.   Respiratory: Negative for cough, hemoptysis, sputum production and shortness of breath.        Positive for pleuritic pain  Cardiovascular: Negative for chest pain, palpitations and leg swelling.  Gastrointestinal: Negative for abdominal pain, constipation, diarrhea, nausea and vomiting.  Genitourinary: Negative for dysuria, frequency and urgency.  Musculoskeletal:       Positive for right rib pain  Skin: Positive for itching. Negative for rash.  Neurological: Negative for weakness and headaches.       Negative for paresthesias      VITAL SIGNS    Temp:  [97.2 F (36.2 C)-98.2 F (36.8 C)] 98.2 F (36.8 C) (07/29 1117) Pulse Rate:  [48-66] 61 (07/29 1117) Resp:  [10-19] 15 (07/29 1117) BP: (91-152)/(50-93) 148/64 (07/29 1117) SpO2:  [78 %-100 %] 92 % (07/29 1117) Weight:  [88 kg (194 lb 1.6 oz)-93 kg (205 lb 0.4 oz)] 93 kg (205 lb 0.4 oz) (07/29 1117) INTAKE / OUTPUT:  Intake/Output Summary (Last 24 hours) at 05/16/2018 1121 Last data filed at 05/16/2018 1109 Gross per 24 hour  Intake 960 ml  Output 235 ml  Net 725 ml       PHYSICAL EXAM   Physical Exam    Constitutional: She is oriented to person, place, and time.  Elderly female, appears older than stated age. Resting in hospital bed  HENT:  Head: Normocephalic and atraumatic.  Eyes: Pupils are equal, round, and reactive to light. EOM are normal.  Cardiovascular:  Normal rate and rhythm. S1 and S2. No murmurs, rubs, or gallops. 2+ dorsalis pedis pulses bilaterally  Pulmonary/Chest:  Clear to auscultation bilaterally. No wheezes, rales, rhonchi  Abdominal:  Soft, non-distented, non tender. Positive bowel sounds. No hepatosplenomegaly  Musculoskeletal: She exhibits no edema.  Lymphadenopathy:    She has no cervical adenopathy.  Neurological: She is alert and oriented to person, place, and time. No cranial nerve deficit.  Skin: Skin is warm and dry. Capillary refill takes less than 2 seconds. There is erythema (facial).       LABS   LABS:  CBC Recent Labs  Lab 05/12/18 0851  WBC 5.9  HGB 12.0  HCT 35.3  PLT 236   Coag's Recent Labs  Lab 05/12/18 0851  APTT 32  INR 1.13   BMET Recent Labs  Lab 05/12/18 0851  NA 142  K 3.9  CL 107  CO2 29  BUN 14  CREATININE 0.88  GLUCOSE 72   Electrolytes Recent Labs  Lab 05/12/18 0851  CALCIUM 8.8*   Sepsis Markers No results for input(s): LATICACIDVEN, PROCALCITON, O2SATVEN in the last 168 hours. ABG No results for input(s): PHART, PCO2ART, PO2ART in the last 168 hours. Liver Enzymes Recent Labs  Lab 05/12/18 0851  AST 19  ALT 15  ALKPHOS 89  BILITOT 0.5  ALBUMIN 3.5   Cardiac Enzymes No results for input(s): TROPONINI, PROBNP in the last 168 hours. Glucose No results for input(s): GLUCAP in the last 168 hours.   Recent Results (from the past 240 hour(s))  Surgical pcr screen     Status: None   Collection Time: 05/12/18  8:51 AM  Result Value Ref Range Status   MRSA, PCR NEGATIVE NEGATIVE Final   Staphylococcus aureus NEGATIVE NEGATIVE Final    Comment: (NOTE) The Xpert SA Assay (FDA approved for  NASAL specimens in patients 75 years of age and older), is one component of a comprehensive surveillance program. It is not intended to diagnose infection nor to guide or monitor treatment. Performed at [facility], [address], [place], [place] [address]      Current Facility-Administered Medications:  .  ceFAZolin (ANCEF) 2-4 GM/100ML-% IVPB, , , ,  .  famotidine (PEPCID) 20 MG tablet, , , ,   IMAGING    Dg Chest Port 1 View  Result Date: 05/16/2018 CLINICAL DATA:  Status post right thoracotomy EXAM: PORTABLE CHEST 1 VIEW COMPARISON:  05/12/2018 FINDINGS: Cardiac shadow is stable. The left lung is clear. Right chest tube is noted as well as postoperative changes in the right lung base. No sizable pneumothorax is seen. No effusion is seen. No bony abnormality is noted. IMPRESSION: No pneumothorax following right thoracotomy. Electronically Signed   By: Inez Catalina M.D.   On: 05/16/2018 10:20    MICRO DATA: MRSA PCR: negative  ANTIMICROBIALS: cefazolin, pre-op    ASSESSMENT/PLAN   57 year old female who is s/p right thoracotomy with wedge resection.  Procedure without complication and pathology sent. Patient became increasingly uncomfortable after I examined her. 2mg  of morphine was given without relief. Dr. Genevive Bi was paged and ordered 15mg  of Toradol. Patient is complaining of itching with morphine. Will add PRN benadryl to be given with morphine.   PULMONARY A: s/p right thoracotomy with wedge resection for primary malignancy of RLL P:  Right chest tube without air leak and on suction, continue -CXR post op shows right chest tube in place with no pneumothorax and minimal subcutaneous air. No crepitus on exam. Repeat CXR tomorrow -further management per Dr. Genevive Bi   CARDIOVASCULAR A: Hypertension P: controlled, continue ICU monitoring -patient takes aspirin 81mg  outpatient, continue   RENAL -no current concerns -monitor electrolytes and  CBG  GASTROINTESTINAL -no current concerns,  will monitor for post op ileus -PRN Zofran for nausea  HEMATOLOGY -SCDs for DVT prophylaxis   INFECTIOUS A: cefazolin given pre-operatively -no indication for antibiotics   ENDOCRINE A:  Monitor CBG   NEUROLOGIC A:  Alert and oriented, but drowsy P:  Continue to monitor as sedation wears off.     I have personally obtained a history, examined the patient, evaluated laboratory and independently reviewed  imaging results, formulated the assessment and plan and placed orders.  The Patient requires high complexity decision making for assessment and support, frequent evaluation and titration of therapies, application of advanced monitoring technologies and extensive interpretation of multiple databases. Critical Care Time devoted to patient care services described in this note is 42 minutes.

## 2018-05-16 NOTE — Anesthesia Postprocedure Evaluation (Signed)
Anesthesia Post Note  Patient: Susan Houston  Procedure(s) Performed: THORACOTOMY/POSSIBLE LOBECTOMY (Right ) PREOP  BRONCHOSCOPY (N/A )  Patient location during evaluation: PACU Anesthesia Type: General Level of consciousness: awake and alert and oriented Pain management: pain level controlled Vital Signs Assessment: post-procedure vital signs reviewed and stable Respiratory status: spontaneous breathing, nonlabored ventilation and respiratory function stable Cardiovascular status: blood pressure returned to baseline and stable Postop Assessment: no signs of nausea or vomiting Anesthetic complications: no     Last Vitals:  Vitals:   05/16/18 1252 05/16/18 1300  BP:  (!) 162/78  Pulse: 63 64  Resp: 11 19  Temp:    SpO2: 99% 100%    Last Pain:  Vitals:   05/16/18 1338  TempSrc:   PainSc: 3                  Jadan Hinojos

## 2018-05-16 NOTE — Transfer of Care (Signed)
Immediate Anesthesia Transfer of Care Note  Patient: Susan Houston  Procedure(s) Performed: THORACOTOMY/POSSIBLE LOBECTOMY (Right ) PREOP  BRONCHOSCOPY (N/A )  Patient Location: PACU  Anesthesia Type:General  Level of Consciousness: drowsy  Airway & Oxygen Therapy: Patient Spontanous Breathing and Patient connected to face mask oxygen  Post-op Assessment: Report given to RN and Post -op Vital signs reviewed and stable  Post vital signs: Reviewed and stable  Last Vitals:  Vitals Value Taken Time  BP 152/93 05/16/2018  9:51 AM  Temp 36.2 C 05/16/2018  9:51 AM  Pulse 66 05/16/2018  9:52 AM  Resp 19 05/16/2018  9:52 AM  SpO2 100 % 05/16/2018  9:52 AM  Vitals shown include unvalidated device data.  Last Pain:  Vitals:   05/16/18 0611  TempSrc: Tympanic         Complications: Patient re-intubated

## 2018-05-16 NOTE — Op Note (Signed)
  05/16/2018  9:48 AM  PATIENT:  Susan Houston  57 y.o. female  PRE-OPERATIVE DIAGNOSIS: Right lower lobe lung mass  POST-OPERATIVE DIAGNOSIS: Frozen section consistent with non-small cell carcinoma of the lung (perhaps squamous cell carcinoma) with negative margins   PROCEDURE: Right thoracotomy with wedge resection right lower lobe mass  SURGEON:  Surgeon(s) and Role:    * Nestor Lewandowsky, MD - Primary    * Pabon, Marjory Lies, MD - Assisting  ASSISTANTS: Chaney Malling, PAS  ANESTHESIA: General  INDICATIONS FOR PROCEDURE this patient is a 57 year old female with a prior history of laryngeal carcinoma treated with radiation therapy and chemotherapy.  Recently she was seen and evaluated and had a 1.2 cm lesion in the right lower lobe that was consistent with carcinoma of the lung on PET scanning.  This was the only lesion identified and she was offered the above-named procedure for definitive diagnosis and therapy.  The indications and risks were explained the patient who gave her informed consent.  DICTATION: Patient was brought to the operating suite and placed in the supine position.  General endotracheal anesthesia was given.  Our anesthesia colleagues position the endotracheal tube using bronchoscopic guidance.  Patient was then turned for right thoracotomy.  All pressure points were carefully padded.  The patient was prepped and draped in usual sterile fashion.    A small skin incision was made and deepened down through the subcutaneous tissues.  The anterior portion of the latissimus was divided.  The serratus was retracted anteriorly without the vision.  The chest was entered and a small to file retractor was placed.  A second small retractor was placed to hold the soft tissues of the chest wall apart.  The tumor is located in the medial aspect of the right lower lobe and measured about 1 cm in size.  Using multiple fires of an endoscopic stapler we are able to completely excise the tumor  with at least a 1.5 cm margin grossly.  This was sent for frozen section.  Frozen section returned a non-small cell carcinoma perhaps a squamous cell carcinoma.  Grossly the tumor was well removed from the staple line measuring at least 1 cm.  We therefore elected to leave the remaining portion of the lower lobe in place as this was the patient's desire to only undergo wedge resection of possible.  A single 28 French chest tube was inserted to the apex of the chest.  This was brought out through a separate stab wound inferiorly.  Exparel was used for an intercostal nerve block.  We then closed the chest using multiple #2 Vicryl pericostal sutures.  The serratus was allowed to return to its normal anatomic position.  The latissimus was approximated with #2 Vicryl.  The subcutaneous tissues with 2-0 Vicryl and the skin with skin clips.  Sterile dressings were applied.  All sponge needle and instrument counts were correct as reported to me at the end of the case.  The patient was explained and taken to the recovery room in stable condition.   Nestor Lewandowsky, MD

## 2018-05-17 LAB — GLUCOSE, CAPILLARY: Glucose-Capillary: 107 mg/dL — ABNORMAL HIGH (ref 70–99)

## 2018-05-17 MED ORDER — MEPERIDINE HCL 25 MG/ML IJ SOLN
25.0000 mg | Freq: Once | INTRAMUSCULAR | Status: DC
Start: 2018-05-17 — End: 2018-05-17
  Filled 2018-05-17: qty 1

## 2018-05-17 MED ORDER — MEPERIDINE HCL 25 MG/ML IJ SOLN
25.0000 mg | Freq: Once | INTRAMUSCULAR | Status: AC
  Administered 2018-05-17: 25 mg via INTRAVENOUS
  Filled 2018-05-17: qty 1

## 2018-05-17 MED ORDER — TRAMADOL HCL 50 MG PO TABS
100.0000 mg | ORAL_TABLET | Freq: Four times a day (QID) | ORAL | Status: DC
  Administered 2018-05-17 – 2018-05-20 (×13): 100 mg via ORAL
  Filled 2018-05-17 (×14): qty 2

## 2018-05-17 MED ORDER — METHOCARBAMOL 500 MG PO TABS
500.0000 mg | ORAL_TABLET | Freq: Four times a day (QID) | ORAL | Status: DC
  Administered 2018-05-17 – 2018-05-20 (×12): 500 mg via ORAL
  Filled 2018-05-17 (×16): qty 1

## 2018-05-17 MED ORDER — HYDRALAZINE HCL 20 MG/ML IJ SOLN: 10.0000 mg | INTRAMUSCULAR | Status: DC | PRN

## 2018-05-17 NOTE — Progress Notes (Signed)
Patient ID: Susan Houston, female   DOB: 03-07-1961, 57 y.o.   MRN: [id number]  She states that her breathing is good this morning.  She does not complain of any shortness of breath.  She does have some discomfort however.  Demerol seems to help some as did the fentanyl.  Her lungs are diminished bilaterally.  Her heart is regular.  Her wounds are clean dry and intact.  The dressings are dry.  There is no air leak.  I do think she can be transferred to the floor today.  We will go ahead and write those orders.  I will adjust her pain medications.

## 2018-05-17 NOTE — Progress Notes (Signed)
Pt rested comfortably during the night for the most part.  She would wake intermittently complaining of pain at the surgical site.  Pain medication was given as ordered.  Each time the pt was able to go back to sleep.  Pt's chest tube remains in place.  There are no leaks noted.  Chest tube dressing remains clean and intact.  Honeycomb dressing below right axillary has scant amount of drainage and is marked. Pt remains alert and oriented and is sinus rhythm on the monitor.  Lungs are clear and diminished and pt wore 2L N/C while sleeping. Foley remains in place with greater than a liter output.

## 2018-05-17 NOTE — Progress Notes (Signed)
Follow up - Critical Care Medicine Note  Patient Details:    Susan Houston is an 57 y.o. female. with history of laryngeal carcinoma, hypertension, hyperlipidemia, diverticulitis, and COPD who is s/p right thoracotomy with wedge resection for RLL malignancy. Procedure without complication. Patient is followed by Dr. Rogue Bussing and Dr. Genevive Bi.   Lines, Airways, Drains: Chest Tube 1 Right Pleural 28 Fr. (Active)  Suction -20 cm H2O 05/17/2018  3:30 AM  Chest Tube Air Leak None 05/17/2018  3:30 AM  Drainage Description Bright red 05/17/2018  3:30 AM  Dressing Status Clean;Dry;Intact 05/17/2018  3:30 AM  Site Assessment Dry 05/17/2018  3:30 AM  Surrounding Skin Unable to view 05/17/2018  3:30 AM  Output (mL) 5 mL 05/17/2018  3:30 AM     Urethral Catheter Leamon Double-lumen 16 Fr. (Active)  Indication for Insertion or Continuance of Catheter Peri-operative use for selective surgical procedure 05/17/2018  7:20 AM  Site Assessment Clean;Intact 05/17/2018  3:30 AM  Catheter Maintenance Bag below level of bladder;Catheter secured;Drainage bag/tubing not touching floor;Insertion date on drainage bag;No dependent loops;Seal intact 05/17/2018  7:20 AM  Collection Container Standard drainage bag 05/17/2018  3:30 AM  Securement Method Leg strap 05/17/2018  3:30 AM  Urinary Catheter Interventions Unclamped 05/17/2018  3:30 AM  Output (mL) 850 mL 05/17/2018  3:30 AM    Anti-infectives:  Anti-infectives (From admission, onward)   Start     Dose/Rate Route Frequency Ordered Stop   05/16/18 1600  ceFAZolin (ANCEF) IVPB 1 g/50 mL premix     1 g 100 mL/hr over 30 Minutes Intravenous Every 8 hours 05/16/18 1125 05/17/18 1559   05/16/18 0608  ceFAZolin (ANCEF) 2-4 GM/100ML-% IVPB    Note to Pharmacy:  Ronnell Freshwater   : cabinet override      05/16/18 0608 05/16/18 1829   05/15/18 2141  ceFAZolin (ANCEF) IVPB 2g/100 mL premix  Status:  Discontinued     2 g 200 mL/hr over 30 Minutes Intravenous 30 min pre-op 05/15/18  2141 05/16/18 1113      Microbiology: Results for orders placed or performed during the hospital encounter of 05/12/18  Surgical pcr screen     Status: None   Collection Time: 05/12/18  8:51 AM  Result Value Ref Range Status   MRSA, PCR NEGATIVE NEGATIVE Final   Staphylococcus aureus NEGATIVE NEGATIVE Final    Comment: (NOTE) The Xpert SA Assay (FDA approved for NASAL specimens in patients 73 years of age and older), is one component of a comprehensive surveillance program. It is not intended to diagnose infection nor to guide or monitor treatment. Performed at [facility], [address] [place], [place] [address]     Studies: Dg Chest 2 View  Result Date: 05/12/2018 CLINICAL DATA:  Preoperative evaluation for upcoming right lower lobe lung mass removal EXAM: CHEST - 2 VIEW COMPARISON:  04/26/2018 FINDINGS: Cardiac shadows within normal limits. Patient's known right basilar mass lesion is seen laterally. The lungs are otherwise clear. No acute bony abnormality is noted. IMPRESSION: Stable right lower lobe mass.  No acute abnormality noted. Electronically Signed   By: Inez Catalina M.D.   On: 05/12/2018 09:56   Dg Ribs Unilateral Right  Result Date: 05/12/2018 CLINICAL DATA:  Right-sided axillary pain for 2 weeks, known right lower lobe mass. EXAM: RIGHT RIBS - 2 VIEW COMPARISON:  04/26/18 FINDINGS: Right lung is well aerated. The patient's known right lower lobe mass lesion is partially visualized. No definitive rib fracture is identified.  Given the location of the lesion and its proximity to the pleura this may contribute to the patient's underlying discomfort. IMPRESSION: No acute rib abnormality noted. Patient's known right lower lobe mass lesion may contribute to the underlying discomfort given its proximity to the pleural margin. Electronically Signed   By: Inez Catalina M.D.   On: 05/12/2018 09:55   Ct Chest W Contrast  Result Date: 04/26/2018 CLINICAL DATA:  57 year old  female with history of laryngeal cancer diagnosed in 2009 status post chemotherapy and radiation therapy. Cough and shortness of breath for the past 3 days. EXAM: CT CHEST WITH CONTRAST TECHNIQUE: Multidetector CT imaging of the chest was performed during intravenous contrast administration. CONTRAST:  38mL ISOVUE-300 IOPAMIDOL (ISOVUE-300) INJECTION 61% COMPARISON:  Chest CT 10/18/2015. FINDINGS: Cardiovascular: Heart size is normal. There is no significant pericardial fluid, thickening or pericardial calcification. There is aortic atherosclerosis, as well as atherosclerosis of the great vessels of the mediastinum and the coronary arteries, including calcified atherosclerotic plaque in the left main, left anterior descending and right coronary arteries. Mediastinum/Nodes: No pathologically enlarged mediastinal or hilar lymph nodes. Esophagus is unremarkable in appearance. No axillary lymphadenopathy. Lungs/Pleura: In the periphery of the right lower lobe there is an elongated nodular area measuring up to 14 x 8 x 21 mm (axial image 99 of series 3 and coronal image 69 of series 5) which appears partially cavitary and has macrolobulated margins. A few other smaller pulmonary nodules are noted elsewhere in the lungs bilaterally, the largest of which is in the right middle lobe associated with the minor fissure measuring 6 x 4 mm (mean diameter 5 mm) on axial image 69 of series 3. No acute consolidative airspace disease. No pleural effusions. Diffuse bronchial wall thickening with moderate centrilobular and mild paraseptal emphysema. Upper Abdomen: Aortic atherosclerosis.  Status post cholecystectomy. Musculoskeletal: There are no aggressive appearing lytic or blastic lesions noted in the visualized portions of the skeleton. IMPRESSION: 1. Elongated macrolobulated partially cavitary nodular area in the periphery of the right lower lobe, new compared to the prior examination from 10/18/2015. This also appears larger  than recent CT the abdomen and pelvis 04/12/2018. The possibility of a primary bronchogenic neoplasm or metastatic lesion should be considered, and further evaluation with PET-CT is recommended in the near future. 2. Aortic atherosclerosis, in addition to left main and 2 vessel coronary artery disease. Please note that although the presence of coronary artery calcium documents the presence of coronary artery disease, the severity of this disease and any potential stenosis cannot be assessed on this non-gated CT examination. Assessment for potential risk factor modification, dietary therapy or pharmacologic therapy may be warranted, if clinically indicated. 3. Diffuse bronchial wall thickening with moderate centrilobular and mild paraseptal emphysema; imaging findings suggestive of underlying COPD. Aortic Atherosclerosis (ICD10-I70.0) and Emphysema (ICD10-J43.9). Electronically Signed   By: Vinnie Langton M.D.   On: 04/26/2018 12:43   Nm Pet Image Initial (pi) Skull Base To Thigh  Result Date: 04/29/2018 CLINICAL DATA:  Initial treatment strategy for lung nodule. History of laryngeal cancer in 2009. EXAM: NUCLEAR MEDICINE PET SKULL BASE TO THIGH TECHNIQUE: 10.2 mCi F-18 FDG was injected intravenously. Full-ring PET imaging was performed from the skull base to thigh after the radiotracer. CT data was obtained and used for attenuation correction and anatomic localization. Fasting blood glucose: 90 mg/dl COMPARISON:  Chest CT from 04/26/2018 and abdomen CT from 04/12/2018. Prior PET-CT from 05/09/2012. FINDINGS: Mediastinal blood pool activity: SUV max 2.9 NECK: Physiologic activity in  the left pterygoid musculature and in the anterior tongue, without CT correlate. Incidental CT findings: Left common carotid atherosclerotic calcification. CHEST: Stable 1.4 cm in long axis right lower lobe nodule on image 111/3 with maximum SUV 4.9. No hypermetabolic adenopathy observed. Incidental CT findings: Supracardiac partial  anomalous pulmonary venous return noted with a pulmonary venous structure from the left upper lobe extending along the left side of the aortic arch drain into the brachiocephalic vein. Atherosclerotic calcification of the aortic arch, right coronary artery, and left anterior descending coronary artery. Centrilobular emphysema. ABDOMEN/PELVIS: No significant abnormal hypermetabolic activity in this region. Incidental CT findings: Cholecystectomy. Aortoiliac atherosclerotic vascular disease. SKELETON: No significant abnormal hypermetabolic activity in this region. Incidental CT findings: none IMPRESSION: 1. The 1.4 cm right lower lobe pulmonary nodule is hypermetabolic with maximum SUV 4.9, suspicious for malignancy. No findings of metastatic spread at this time. The nodule abuts the visceral pleural surface, without pleural effusion or findings of chest wall invasion. 2. Supracardiac partial anomalous pulmonary venous return. 3. Aortic Atherosclerosis (ICD10-I70.0) and Emphysema (ICD10-J43.9). Coronary atherosclerosis. Electronically Signed   By: Van Clines M.D.   On: 04/29/2018 13:05   Dg Chest Port 1 View  Result Date: 05/16/2018 CLINICAL DATA:  Status post right thoracotomy EXAM: PORTABLE CHEST 1 VIEW COMPARISON:  05/12/2018 FINDINGS: Cardiac shadow is stable. The left lung is clear. Right chest tube is noted as well as postoperative changes in the right lung base. No sizable pneumothorax is seen. No effusion is seen. No bony abnormality is noted. IMPRESSION: No pneumothorax following right thoracotomy. Electronically Signed   By: Inez Catalina M.D.   On: 05/16/2018 10:20    Consults:    Subjective:    Overnight Issues: overnight she did quite well, still complaining of pain and discomfort secondary to chest tube.  Objective:  Vital signs for last 24 hours: Temp:  [97.2 F (36.2 C)-98.4 F (36.9 C)] 98.1 F (36.7 C) (07/30 0300) Pulse Rate:  [48-84] 69 (07/30 0600) Resp:  [10-25] 12  (07/30 0600) BP: (91-162)/(50-93) 129/59 (07/30 0600) SpO2:  [78 %-100 %] 99 % (07/30 0600) Weight:  [205 lb 0.4 oz (93 kg)] 205 lb 0.4 oz (93 kg) (07/29 1117)  Hemodynamic parameters for last 24 hours:    Intake/Output from previous day: 07/29 0701 - 07/30 0700 In: 1937.9 [P.O.:180; I.V.:1656.3; IV Piggyback:101.7] Out: 3306 [Urine:3190; Blood:25; Chest Tube:91]  Intake/Output this shift: No intake/output data recorded.  Vent settings for last 24 hours:    Physical Exam:  Constitutional: She is oriented to person, place, and time.  Resting comfortably complaining of mild pain but no acute distress HENT: rachea midline, no accessory muscle utilization Cardiovascular:  Normal rate and rhythm. S1 and S2. No murmurs, rubs, or gallops. 2+ dorsalis pedis pulses bilaterally  Pulmonary/Chest: clear to auscultation, bilateral aeration, chest tube noted, pleural pressure reflection noted without air leak minimal drainage Abdominal:  Soft, non-distented, non tender. Positive bowel sounds. No hepatosplenomegaly  Musculoskeletal: She exhibits no edema.     Assessment/Plan:   Patient is status post right thoracotomy, wedge resection of right lower lobe malignancy. Doing well at this time, pending morning chest x-ray, no air leak noted with positive pleural reflection appreciated, patient is still having pain. Is allergic to ine, states she takes Demerol without difficulty so we'll give her a dose. She has Toradol ordered. Pending Dr. Genevive Bi but most likely will be able to be transferred to the floor today  Olly Shiner 05/17/2018  *Care during the described time  interval was provided by me and/or other providers on the critical care team.  I have reviewed this patient's available data, including medical history, events of note, physical examination and test results as part of my evaluation.

## 2018-05-17 NOTE — Progress Notes (Signed)
PT Hold Note  Patient Details Name: Susan Houston MRN: [id number] DOB: 05/22/1961   Cancelled Treatment:    Reason Eval/Treat Not Completed: Medical issues which prohibited therapy. Order received, chart reviewed, and RN consulted. Per RN patient's pain is currently not under control and she is tearful. She would not respond well to mobility requests. Will hold patient today and attempt PT treatment on later time/date as patient's pain is better controlled.  Lyndel Safe Huprich PT, DPT, GCS  Huprich,Jason 05/17/2018, 1:34 PM

## 2018-05-17 NOTE — Progress Notes (Signed)
Received pt on floor. Agree with previous assessment. Focused assessment done. C/o pain. See MAR. Will continue to monitor.

## 2018-05-18 ENCOUNTER — Inpatient Hospital Stay: Payer: Medicare Other

## 2018-05-18 LAB — CBC
HCT: 33.7 % — ABNORMAL LOW (ref 35.0–47.0)
HEMOGLOBIN: 11.3 g/dL — AB (ref 12.0–16.0)
MCH: 30.1 pg (ref 26.0–34.0)
MCHC: 33.6 g/dL (ref 32.0–36.0)
MCV: 89.6 fL (ref 80.0–100.0)
Platelets: 229 10*3/uL (ref 150–440)
RBC: 3.76 MIL/uL — AB (ref 3.80–5.20)
RDW: 14.4 % (ref 11.5–14.5)
WBC: 8.6 10*3/uL (ref 3.6–11.0)

## 2018-05-18 LAB — BASIC METABOLIC PANEL
Anion gap: 7 (ref 5–15)
BUN: 9 mg/dL (ref 6–20)
CHLORIDE: 101 mmol/L (ref 98–111)
CO2: 29 mmol/L (ref 22–32)
CREATININE: 0.6 mg/dL (ref 0.44–1.00)
Calcium: 8.7 mg/dL — ABNORMAL LOW (ref 8.9–10.3)
GFR calc non Af Amer: >60 mL/min (ref >60)
Glucose, Bld: 103 mg/dL — ABNORMAL HIGH (ref 70–99)
Potassium: 4.7 mmol/L (ref 3.5–5.1)
SODIUM: 137 mmol/L (ref 135–145)

## 2018-05-18 MED ORDER — ALBUTEROL SULFATE (2.5 MG/3ML) 0.083% IN NEBU
2.5000 mg | INHALATION_SOLUTION | Freq: Three times a day (TID) | RESPIRATORY_TRACT | Status: DC
  Administered 2018-05-18 – 2018-05-20 (×5): 2.5 mg via RESPIRATORY_TRACT
  Filled 2018-05-18 (×6): qty 3

## 2018-05-18 NOTE — Progress Notes (Signed)
Patient ID: Susan Houston, female   DOB: 1961-09-02, 57 y.o.   MRN: [id number] Pain under better control but still itching with Percocet.  Instructed her to ask for Benadryl if needed.  Ate well.  No BM yet  Wounds redressed.  Clean and dry. No air leak. Minimal CT drainage Lungs with mechanical sounds on right.  Clear Heart regular Abd soft  Will place chest tube to water seal and repeat CXRay this afternoon Check on labs and path Possible discharge tomorrow

## 2018-05-18 NOTE — Progress Notes (Signed)
Physical Therapy Evaluation Patient Details Name: Susan Houston MRN: [id number] DOB: [birth year] Today's Date: 05/18/2018   History of Present Illness  Susan Houston is a 57 y.o. female who underwent R thoracotomy with wedge resection for RLL malignancy. She currently has a chest tube and has been up to walk with MD and RN earlier in AM. PMH includes laryngeal carcinoma, HTN, diverticulitis, COPD, and HTN. Pt agreeable to PT evaluation at this time.   Clinical Impression  Pt admitted with above diagnosis. Pt currently with functional limitations due to the deficits listed below (see PT Problem List). Increase in time and bed rails required for assist to perform bed mobility Once upright pt is steady in sitting. Pt transfers slowly but safely from sitting to standing. Steady once upright without support on walker. Pt is able to complete two laps around RN station with therapist. Her SaO2 drops to 86% intermittently requiring standing rest breaks with 90 seconds of pursed lip breathing for SaO2 to recover to 91%. Pt reports mild DOE and is fatigued at end of ambulation distance. Unable to ambulate farther at this time. RN notified of SaO2 readings during ambulation. Pt would benefit from [facility] PT after discharge. Encouraged pt to utilize her walker as needed. Pt will benefit from PT services to address deficits in strength, balance, and mobility in order to return to full function at home.      Follow Up Recommendations Home health PT    Equipment Recommendations  None recommended by PT;Other (comment)(Advised to use her walker)    Recommendations for Other Services       Precautions / Restrictions Precautions Precautions: Fall Restrictions Weight Bearing Restrictions: No      Mobility  Bed Mobility Overal bed mobility: Modified Independent             General bed mobility comments: Increase in time and bed rails required for assist. Once upright pt is steady in  sitting  Transfers Overall transfer level: Needs assistance Equipment used: None Transfers: Sit to/from Stand Sit to Stand: Min guard         General transfer comment: Pt transfers slowly but safely from sitting to standing. Steady once upright without support on walker  Ambulation/Gait Ambulation/Gait assistance: Min guard Gait Distance (Feet): 350 Feet Assistive device: Rolling walker (2 wheeled)       General Gait Details: Pt is able to complete two laps around RN station with therapist. Her SaO2 drops to 86% intermittently requiring standing rest break with 90s of pursed lip breathing for SaO2 to recover to 91%. Pt reports mild DOE and is fatigued at end of ambulation distance. Unable to ambulate farther at this time. RN notified of SaO2 readings during ambulation  Stairs            Wheelchair Mobility    Modified Rankin (Stroke Patients Only)       Balance Overall balance assessment: Needs assistance Sitting-balance support: No upper extremity supported Sitting balance-Leahy Scale: Good     Standing balance support: No upper extremity supported Standing balance-Leahy Scale: Fair                               Pertinent Vitals/Pain Pain Assessment: 0-10 Pain Score: 7  Pain Location: R flank near incision Pain Intervention(s): Monitored during session    Home Living Family/patient expects to be discharged to:: Private residence Living Arrangements: Spouse/significant other Available Help at Discharge: Family Type  of Home: House Home Access: Stairs to enter Entrance Stairs-Rails: Can reach both Entrance Stairs-Number of Steps: 3 Home Layout: One level Home Equipment: Walker - 2 wheels;Cane - single point;Bedside commode;Shower seat(No home O2)      Prior Function Level of Independence: Needs assistance   Gait / Transfers Assistance Needed: Independent with limited community ambulation without an assistive device. Reports 4-5 fall in the  last 12 months  ADL's / Homemaking Assistance Needed: Independent with ADLs but requries assist for IADLs        Hand Dominance   Dominant Hand: Right    Extremity/Trunk Assessment   Upper Extremity Assessment Upper Extremity Assessment: Overall WFL for tasks assessed    Lower Extremity Assessment Lower Extremity Assessment: Generalized weakness       Communication   Communication: No difficulties  Cognition Arousal/Alertness: Awake/alert Behavior During Therapy: WFL for tasks assessed/performed Overall Cognitive Status: Within Functional Limits for tasks assessed                                        General Comments      Exercises     Assessment/Plan    PT Assessment Patient needs continued PT services  PT Problem List Decreased strength;Decreased activity tolerance;Decreased balance       PT Treatment Interventions DME instruction;Gait training;Functional mobility training;Therapeutic activities;Therapeutic exercise;Balance training    PT Goals (Current goals can be found in the Care Plan section)  Acute Rehab PT Goals Patient Stated Goal: Return to prior level of function PT Goal Formulation: With patient Time For Goal Achievement: 06/01/18 Potential to Achieve Goals: Good    Frequency Min 2X/week   Barriers to discharge        Co-evaluation               AM-PAC PT "6 Clicks" Daily Activity  Outcome Measure Difficulty turning over in bed (including adjusting bedclothes, sheets and blankets)?: A Little Difficulty moving from lying on back to sitting on the side of the bed? : A Little Difficulty sitting down on and standing up from a chair with arms (e.g., wheelchair, bedside commode, etc,.)?: A Little Help needed moving to and from a bed to chair (including a wheelchair)?: None Help needed walking in hospital room?: A Little Help needed climbing 3-5 steps with a railing? : A Little 6 Click Score: 19    End of Session    Activity Tolerance: Patient tolerated treatment well Patient left: in bed;with call bell/phone within reach;with bed alarm set Nurse Communication: Mobility status PT Visit Diagnosis: Unsteadiness on feet (R26.81);Muscle weakness (generalized) (M62.81);History of falling (Z91.81)    Time: 7262-0355 PT Time Calculation (min) (ACUTE ONLY): 22 min   Charges:   PT Evaluation $PT Eval Low Complexity: 1 Low PT Treatments $Therapeutic Activity: 8-22 mins        Susan Houston PT, DPT, GCS   Susan Houston 05/18/2018, 4:06 PM

## 2018-05-18 NOTE — Progress Notes (Signed)
CNA informed me that the patients sats were low.  They were 82% when I checked them.  On 2 liters they were 94 %.  Dr Genevive Bi came in to evaluate the patient for me.  The patient, Dr Genevive Bi, and myself went for a walk around the nurses station.  Patients oxygen once dropped to the 80's but once the patient stopped and rested the oxygen went back into the 90's.  Dr Genevive Bi also informed of drainage from the surgical incision and that I put a dressing over the incision

## 2018-05-19 LAB — SURGICAL PATHOLOGY

## 2018-05-19 MED ORDER — MORPHINE SULFATE (PF) 2 MG/ML IV SOLN
2.0000 mg | Freq: Once | INTRAVENOUS | Status: AC
  Administered 2018-05-19: 2 mg via INTRAVENOUS
  Filled 2018-05-19: qty 1

## 2018-05-19 NOTE — Progress Notes (Signed)
Physical Therapy Treatment Patient Details Name: Susan Houston MRN: [id number] DOB: Sep 23, 1961 Today's Date: 05/19/2018    History of Present Illness Susan Houston is a 57 y.o. female who underwent R thoracotomy with wedge resection for RLL malignancy. She currently has a chest tube and has been up to walk with MD and RN earlier in AM. PMH includes laryngeal carcinoma, HTN, diverticulitis, COPD, and HTN. Pt agreeable to PT evaluation at this time.     PT Comments    Pt requested pain medication prior to session.  Returned after medication given.  To edge of bed with increased time and use of rail but no assist needed.  Pt on 1 lpm - sats 93%.  Pt was able to stand with min guard/supervision and ambulate around nursing unit x 1 with walker.  Slow but steady gait.  Upon return to room, sats noted to be 78% on 1 lpm.  Pt increased to 2 lpm briefly to aid in O2 recovery.  Education for deep breathing and pt was able to return to low 90's.  Decreased back to 1 lpm and discussed with primary RN.  Pt more fatigued with gait this session vs yesterday but overall continues to do well with mobility.   Follow Up Recommendations  Home health PT     Equipment Recommendations  None recommended by PT;Other (comment)    Recommendations for Other Services       Precautions / Restrictions Precautions Precautions: Fall Precaution Comments: watch O2 sats    Mobility  Bed Mobility Overal bed mobility: Modified Independent                Transfers Overall transfer level: Needs assistance Equipment used: Rolling walker (2 wheeled) Transfers: Sit to/from Stand Sit to Stand: Supervision            Ambulation/Gait Ambulation/Gait assistance: Min guard Gait Distance (Feet): 180 Feet Assistive device: Rolling walker (2 wheeled) Gait Pattern/deviations: Step-through pattern Gait velocity: decreased   General Gait Details: Only able to tolerate 1 lap around unit this session.  Sats dec to 78%  on 1lpm with gait.   Stairs             Wheelchair Mobility    Modified Rankin (Stroke Patients Only)       Balance Overall balance assessment: Needs assistance Sitting-balance support: No upper extremity supported Sitting balance-Leahy Scale: Good     Standing balance support: No upper extremity supported Standing balance-Leahy Scale: Fair                              Cognition Arousal/Alertness: Awake/alert Behavior During Therapy: WFL for tasks assessed/performed Overall Cognitive Status: Within Functional Limits for tasks assessed                                        Exercises      General Comments        Pertinent Vitals/Pain Pain Assessment: 0-10 Pain Score: 5  Pain Location: R flank near incision - chest tube removed this am. Pain Descriptors / Indicators: Sore Pain Intervention(s): Limited activity within patient's tolerance;Premedicated before session    Home Living                      Prior Function  PT Goals (current goals can now be found in the care plan section) Progress towards PT goals: Progressing toward goals    Frequency    Min 2X/week      PT Plan Current plan remains appropriate    Co-evaluation              AM-PAC PT "6 Clicks" Daily Activity  Outcome Measure  Difficulty turning over in bed (including adjusting bedclothes, sheets and blankets)?: None Difficulty moving from lying on back to sitting on the side of the bed? : None Difficulty sitting down on and standing up from a chair with arms (e.g., wheelchair, bedside commode, etc,.)?: None Help needed moving to and from a bed to chair (including a wheelchair)?: None Help needed walking in hospital room?: A Little Help needed climbing 3-5 steps with a railing? : A Little 6 Click Score: 22    End of Session Equipment Utilized During Treatment: Gait belt;Oxygen Activity Tolerance: Patient tolerated treatment  well;Patient limited by fatigue Patient left: in bed;with bed alarm set;with call bell/phone within reach;with family/visitor present Nurse Communication: Other (comment)       Time: 9539-6728 PT Time Calculation (min) (ACUTE ONLY): 17 min  Charges:  $Gait Training: 8-22 mins                     Susan Houston, PTA 05/19/18, 2:47 PM

## 2018-05-19 NOTE — Care Management Note (Signed)
Case Management Note  Patient Details  Name: CRYSTOL WALPOLE MRN: [id number] Date of Birth: 10-17-1961  Subjective/Objective:   Patient to be discharged with home health. [facility] and prefers to [facility] them again. Patient also qualifies for chronic oxygen. Appropriate sats documented. Referral placed with [name] from [facility] who agrees to accept the case. Patient lives at home with husband. Husband drives and provides transport. Patient has a walker, bedside commode and shower chair all in the home. PCP is [facility]. Patient utilizes [facility] in [place] and obtains medications without issue.                  Action/Plan:   Expected Discharge Date:  05/23/18               Expected Discharge Plan:     In-House Referral:     Discharge planning Services  CM Consult  Post Acute Care Choice:  Home Health Choice offered to:     DME Arranged:  Oxygen DME Agency:  [place]:  PT [facility] Agency:  [place]  Status of Service:  In process, will continue to follow  If discussed at Long Length of Stay Meetings, dates discussed:    Additional Comments:  Latanya Maudlin, RN 05/19/2018, 6:53 PM

## 2018-05-19 NOTE — Progress Notes (Signed)
Patient ID: Susan Houston, female   DOB: Jul 28, 1961, 57 y.o.   MRN: [id number]  Pain is improving.  Wounds clean and dry.  All dressings changed.  No air leak CT with minimal drainage.  CT removed.  Will try to wean oxygen today.  Path shows squamous cell carcinoma - primary or met???

## 2018-05-19 NOTE — Care Management Important Message (Signed)
Copy of signed IM left with patient in room.  

## 2018-05-19 NOTE — Progress Notes (Addendum)
SATURATION QUALIFICATIONS: (This note is used to comply with regulatory documentation for home oxygen)  Patient Saturations on Room Air at Rest = 93%  Patient Saturations on Room Air while Ambulating = 78%  Patient Saturations on 2 Liters of oxygen while Ambulating = 91%  Please briefly explain why patient needs home oxygen:

## 2018-05-20 DIAGNOSIS — Z23 Encounter for immunization: Secondary | ICD-10-CM | POA: Diagnosis not present

## 2018-05-20 LAB — BPAM RBC
BLOOD PRODUCT EXPIRATION DATE: 201908242359
Blood Product Expiration Date: 201908212359
UNIT TYPE AND RH: 9500
UNIT TYPE AND RH: 9500

## 2018-05-20 LAB — TYPE AND SCREEN
ABO/RH(D): B NEG
ANTIBODY SCREEN: NEGATIVE
UNIT DIVISION: 0
UNIT DIVISION: 0

## 2018-05-20 LAB — PREPARE RBC (CROSSMATCH)

## 2018-05-20 MED ORDER — PREDNISOLON-GATIFLOX-BROMFENAC 1-0.5-0.075 % OP SUSP: 1.0000 [drp] | Freq: Two times a day (BID) | OPHTHALMIC | Status: DC

## 2018-05-20 MED ORDER — DIPHENHYDRAMINE HCL 25 MG PO CAPS
25.0000 mg | ORAL_CAPSULE | Freq: Once | ORAL | Status: AC
  Administered 2018-05-20: 25 mg via ORAL
  Filled 2018-05-20: qty 1

## 2018-05-20 NOTE — Progress Notes (Signed)
Per MD okay for RN to order one time dose of PO benadryl 25 mg.

## 2018-05-20 NOTE — Discharge Summary (Signed)
Physician Discharge Summary  Patient ID: Susan Houston MRN: [id number] DOB/AGE: 58/58/1962 58 y.o.  Admit date: 05/16/2018 Discharge date: 05/20/2018   Discharge Diagnoses:  Active Problems:   Mass of lower lobe of right lung   Lung cancer [facility])   Procedures: She was admitted to the hospital on July 29 where she underwent a right thoracotomy and wedge resection of a right lower lobe mass.  The final pathology revealed a squamous cell carcinoma.  She had previously had a head neck cancer treated with radiation therapy and chemotherapy several years ago.  She will follow-up with Dr. Lynett Fish to ascertain the significance of the histology from the lung.  Hospital Course: She was admitted to the intensive care unit postoperatively where she was nursed overnight.  Her chest tube was removed on postoperative day #3.  She had some problems with hypoxia and she was treated with oxygen therapy and was discharged home on 2 L of oxygen therapy.  She was seen by her pulmonologist who recommended Spiriva and Breo for her postoperative management of her COPD.  At the time of discharge she was placed on Percocet 02/19/2024 1 to 2 tablets as needed for pain.  She was also given prescriptions for tramadol 100 mg every 6 hours and Robaxin 500 mg every 6 hours.  I also gave her a prescription for Breo and Spiriva.  Her other medications were unchanged.  She will follow-up with Dr. Genevive Bi in 7 to 10 days as well as Dr. Lynett Fish.  Once I see her back in the office we will then make recommendations regarding her referral to our pulmonologist for continued management of her COPD.  Disposition: Discharge disposition: 06-Home-Health Care Svc       Discharge Instructions    Diet - low sodium heart healthy   Complete by:  As directed    Discharge wound care:   Complete by:  As directed    Leave dressing intact until Saturday evening when it can be removed.  At that time he should get in the shower and wash over the wounds  with soap and water.  You do not need to cover them with any dressings.   Increase activity slowly   Complete by:  As directed      Allergies as of 05/20/2018      Reactions   Morphine And Related Itching   Adhesive [tape] Other (See Comments)   Plastic tape rips skin and bruises her. PLEASE USE PAPER TAPE   Lexapro [escitalopram Oxalate] Other (See Comments)   Keeps awake for days.       Medication List    TAKE these medications   aspirin 81 MG tablet Take 81 mg by mouth daily.   Prednisolon-Gatiflox-Bromfenac 1-0.5-0.075 % Susp Apply 1 drop to eye 2 (two) times daily.   tiZANidine 4 MG capsule Commonly known as:  ZANAFLEX Take 2 mg by mouth at bedtime.   traZODone 50 MG tablet Commonly known as:  DESYREL Take 25 mg by mouth at bedtime.            Durable Medical Equipment  (From admission, onward)        Start     Ordered   05/20/18 1011  DME Oxygen  Once    Question Answer Comment  Mode or (Route) Nasal cannula   Liters per Minute 2   Frequency Continuous (stationary and portable oxygen unit needed)   Oxygen conserving device No   Oxygen delivery system Gas  05/20/18 1012       Discharge Care Instructions  (From admission, onward)        Start     Ordered   05/20/18 0000  Discharge wound care:    Comments:  Leave dressing intact until Saturday evening when it can be removed.  At that time he should get in the shower and wash over the wounds with soap and water.  You do not need to cover them with any dressings.   05/20/18 0750     Follow-up Information    Nestor Lewandowsky, MD. Go on 05/31/2018.   Specialties:  Cardiothoracic Surgery, General Surgery Why:  Turesday August 13th at [facility] for a follow-up appoinment  Contact information: [place] [address] [phone]        Cammie Sickle, MD. Go on 05/27/2018.   Specialties:  Internal Medicine, Oncology Why:  Friday August 9th at 11:30pm for a follow-up   Contact information: [place] [address] [phone]           Nestor Lewandowsky, MD

## 2018-05-20 NOTE — Progress Notes (Signed)
Patient ID: TYLEIGH MAHN, female   DOB: [birth year], 57 y.o.   MRN: [id number]  Pulmonary/critical care attending  Over last 24 hours Mrs. Solan has done well. She still has a 2 L oxygen requirement. She denies any fever, chills, cough, shortness of breath, leg swelling, chest pain or hemoptysis. He states she has been using her incentive spirometer. She also agrees she has had adequate pain control. She does have a diagnosis of emphysema per patient and at present time does not take home inhalers.  Vital signs: Please see the above listed vital signs Cardiovascular: Regular rate and rhythm Pulmonary: Clear to auscultation some scant crackles appreciated in the right lung base, no bronchospasm appreciated Abdominal: Positive bowel sounds, soft nontender Extremities: No clubbing cyanosis or edema noted Neurologic: No focal deficits appreciated  No significant laboratory abnormalities appreciated Last chest x-ray performed on 7/31 showed chest tube on the right with some right lower lobe platelike atelectasis  Assessment and plan:  Status post resection of squamous cell carcinoma Patient still with FiO2 requirement.Does have a history of emphysema. Would recommend Spiriva,Breo, rescue short-acting beta agonist, oxygen at 2 L, incentive spirometry and ambulation with follow-up in the outpatient clinic. At that time can reassess if oxygen can be DC'd  Hermelinda Dellen, D.O.

## 2018-05-20 NOTE — Progress Notes (Signed)
Patient ID: Susan Houston, female   DOB: 25-Feb-1961, 57 y.o.   MRN: [id number]  She had a rather uneventful evening.  She slept better.  She still has some discomfort.  She has not moved her bowels but she says this is normal for her.  She is afebrile.  Her chest tube was removed yesterday without incident.  Her lungs are clear bilaterally.  Her heart is regular without murmurs.  Her oxygen saturations have remained a concern.  She is in the 90s with 1 L of oxygen but does desaturate with walking.  I believe that she would be better served with oxygen for a short course of time.  We will arrange for that.  I did give her all of her discharge instructions today.  I wrote her prescriptions for Percocet, tramadol and Robaxin.  I will see her back in the office in 1 week.  We will make an appointment with her to see Dr. Lynett Fish in 1 week as well.  Final pathology did reveal squamous cell carcinoma.

## 2018-05-20 NOTE — Care Management Note (Signed)
Case Management Note  Patient Details  Name: Susan Houston MRN: [id number] Date of Birth: [birth year]   Patient to discharge home today.  [name] with [facility] notified of discharge.  Portable O2 to be delivered to room prior to discharge.   Subjective/Objective:                    Action/Plan:   Expected Discharge Date:  05/20/18               Expected Discharge Plan:  [place]  In-House Referral:     Discharge planning Services  CM Consult  Post Acute Care Choice:  Home Health, Durable Medical Equipment Choice offered to:  Patient  DME Arranged:  Oxygen DME Agency:  [place]:  PT, RN [facility] Agency:  [place]  Status of Service:  Completed, signed off  If discussed at [place] of Stay Meetings, dates discussed:    Additional Comments:  Beverly Sessions, RN 05/20/2018, 11:02 AM

## 2018-05-20 NOTE — Progress Notes (Signed)
Discharge instructions reviewed with the patient by [facility].  IV removed.  Patient sent out via wheelchair with home oxygen in use to her waiting ride

## 2018-05-23 ENCOUNTER — Other Ambulatory Visit: Payer: Self-pay

## 2018-05-23 ENCOUNTER — Telehealth: Payer: Self-pay | Admitting: Cardiothoracic Surgery

## 2018-05-23 ENCOUNTER — Ambulatory Visit
Admission: RE | Admit: 2018-05-23 | Discharge: 2018-05-23 | Disposition: A | Discharge status: Home / Self Care | Payer: Medicare Other | Source: Ambulatory Visit | Attending: Cardiothoracic Surgery | Admitting: Cardiothoracic Surgery

## 2018-05-23 ENCOUNTER — Ambulatory Visit (INDEPENDENT_AMBULATORY_CARE_PROVIDER_SITE_OTHER): Payer: Medicare Other | Admitting: Cardiothoracic Surgery

## 2018-05-23 ENCOUNTER — Encounter: Payer: Self-pay | Admitting: Cardiothoracic Surgery

## 2018-05-23 VITALS — BP 152/84 | HR 73 | Temp 97.9°F | Ht 62.0 in | Wt 194.0 lb

## 2018-05-23 DIAGNOSIS — R918 Other nonspecific abnormal finding of lung field: Secondary | ICD-10-CM

## 2018-05-23 DIAGNOSIS — C3490 Malignant neoplasm of unspecified part of unspecified bronchus or lung: Secondary | ICD-10-CM | POA: Diagnosis not present

## 2018-05-23 DIAGNOSIS — J90 Pleural effusion, not elsewhere classified: Secondary | ICD-10-CM | POA: Diagnosis not present

## 2018-05-23 MED ORDER — GABAPENTIN 100 MG PO CAPS
100.0000 mg | ORAL_CAPSULE | Freq: Three times a day (TID) | ORAL | 0 refills | Status: DC
Start: 2018-05-23 — End: 2018-06-28

## 2018-05-23 MED ORDER — OXYCODONE-ACETAMINOPHEN 7.5-325 MG PO TABS: 1.0000 | ORAL_TABLET | ORAL | 0 refills | Status: DC | PRN

## 2018-05-23 NOTE — Patient Instructions (Addendum)
Please see your follow up appointment listed below. Someone from [facility] office will call to schedule an appointment. If you do not hear from anyone within 5 days please call and let us know.  Please drink magnesium citrate to help with your constipation.

## 2018-05-23 NOTE — Telephone Encounter (Signed)
Patient has called and complains of yellow drainage coming from the incision site from surgery DOS: 05/20/18-right lower lobe wedge resection. This began yesterday. Patient states that she is taking all medication as prescribed with no relief of pain. Patient complains of cold chills starting at 9:00am today, no appetite. No fever/nausea/vomitting. She describes the pain as a throbbing sensation. She has had no BM since surgery date. She is passing gas. She is on home oxygen- 2 liters per minute.   Patient does have home healthcare with [facility] that was scheduled for today for the first visit. She will get this rescheduled.   I have advised Dr Genevive Bi of this. She will have a chest x ray and see Dr Genevive Bi today in the office.

## 2018-05-23 NOTE — Patient Outreach (Signed)
[facility] [address]) Care Management  05/23/2018  [name] 03-13-1961 [id number]  EMMI: general discharge red alert Referral date: 05/23/18 Referral reason: Knows who to call about changes in condition: NO,  Unfilled prescriptions: YES Insurance: [employer] Day #  1  Telephone call to patient regarding EMMI general discharge red alert. HIPAA verified by patient. Explained reason for call.  Patient states she is still having quite a bit of pain due to her recent surgery.  Patient reports her pain to be a 8 on a scale of 1 to 10 with 10 being the worst. Patient states she took her pain medication within the past hour.  Patient reports the pain medication helps some but not much. RNCM advised patient to take her pain medication as prescribed.  Patient reports her dressing to the incision area was removed in the hospital.. Patient states she is still having quite a bit of drainage from the incision site.  Patient states she is unsure if she is running a fever.   Patient reports she is scheduled to see the surgeon, Dr. Genevive Bi on 05/31/18.  RNCM advised patient to call Dr. Genevive Bi office today to report her increased pain level and drainage.  Patient reports she has all of her prescriptions and is taking them as advised.  Patient states she was sent home with oxygen. She states she is having some nose bleeding since having the oxygen. RNCM advised patient to inform her doctor of the nose bleeds.  Patient states the equipment company will be coming today to bring her portable oxygen as well.  RNCM discussed signs/ symptoms on infection with patient. RNCm reinforced with patient to call the surgeon's office today to report reported symptoms. Patient verbalized understanding. Patient confirms she has contact phone number for surgeon's office.  Patient states she has a follow up appointment with her oncologist on 05/27/18.   RNCM advised patient to notify MD of any changes in condition prior to  scheduled appointment. RNCM provided contact name and number: [phone] or main office number [phone] and 24 hour nurse advise line [phone].  Patient request information be mailed to her.  RNCM verified patient aware of 911 services for urgent/ emergent needs.  PLAN:  RNCM will close patient due to patient being assessed and having no further needs.  RNCM will send closure notification to primary MD.   Quinn Plowman RN,BSN,CCM [facility] Telephonic  [phone]

## 2018-05-23 NOTE — Progress Notes (Signed)
She comes in today with continued complaints of incisional pain.  She states this feels kind of like a throbbing pain.  She has had some subjective chills but no fever.  She has had a reasonably good appetite but has not had a bowel movement yet.  She remains on her oxygen therapy and states that she has been pretty active around the house overall.  She does not have a cough  Her thoracotomy wound is well-healed.  She is now 1 week out from her surgery and the incision is clean dry and intact.  There is no drainage.  There is no erythema.  Her chest tube site looks good as well.  We did remove that suture.  Her lungs show decreased breath sounds at both bases.  Her heart is regular.  There is no abnormalities palpable with the wound.  Independent review of her chest x-ray today shows some blunting at the right costophrenic angle.  There is no pneumothorax.  We reviewed her medications.  We we will increase her Percocet to 7.5 325.  We will also begin some Neurontin at 100 mg 3 times a day.  I instructed her to continue using her incentive spirometer.  We will also get her to see our pulmonologist for continued follow-up of her COPD.  She will see Dr. Lynett Fish later this week and I will see her back again in 2 weeks.  I instructed her to take some over-the-counter stool softeners.  She has some milk of magnesia and magnesium citrate at home that she has used in the past with good results.  She will try those.  She will contact us if she has any difficulty in exchanging her prior dose of Percocet at her local pharmacist.

## 2018-05-24 ENCOUNTER — Other Ambulatory Visit: Payer: Self-pay | Admitting: Family Medicine

## 2018-05-24 DIAGNOSIS — G8929 Other chronic pain: Secondary | ICD-10-CM | POA: Diagnosis not present

## 2018-05-24 DIAGNOSIS — C3431 Malignant neoplasm of lower lobe, right bronchus or lung: Secondary | ICD-10-CM | POA: Diagnosis not present

## 2018-05-24 DIAGNOSIS — Z8521 Personal history of malignant neoplasm of larynx: Secondary | ICD-10-CM | POA: Diagnosis not present

## 2018-05-24 DIAGNOSIS — I10 Essential (primary) hypertension: Secondary | ICD-10-CM | POA: Diagnosis not present

## 2018-05-24 DIAGNOSIS — Z9981 Dependence on supplemental oxygen: Secondary | ICD-10-CM | POA: Diagnosis not present

## 2018-05-24 DIAGNOSIS — Z902 Acquired absence of lung [part of]: Secondary | ICD-10-CM | POA: Diagnosis not present

## 2018-05-24 DIAGNOSIS — J45909 Unspecified asthma, uncomplicated: Secondary | ICD-10-CM | POA: Diagnosis not present

## 2018-05-24 DIAGNOSIS — Z483 Aftercare following surgery for neoplasm: Secondary | ICD-10-CM | POA: Diagnosis not present

## 2018-05-24 DIAGNOSIS — Z791 Long term (current) use of non-steroidal anti-inflammatories (NSAID): Secondary | ICD-10-CM | POA: Diagnosis not present

## 2018-05-24 DIAGNOSIS — Z7982 Long term (current) use of aspirin: Secondary | ICD-10-CM | POA: Diagnosis not present

## 2018-05-24 DIAGNOSIS — Z7951 Long term (current) use of inhaled steroids: Secondary | ICD-10-CM | POA: Diagnosis not present

## 2018-05-24 DIAGNOSIS — J449 Chronic obstructive pulmonary disease, unspecified: Secondary | ICD-10-CM | POA: Diagnosis not present

## 2018-05-24 DIAGNOSIS — M79605 Pain in left leg: Secondary | ICD-10-CM | POA: Diagnosis not present

## 2018-05-24 DIAGNOSIS — Z96653 Presence of artificial knee joint, bilateral: Secondary | ICD-10-CM | POA: Diagnosis not present

## 2018-05-24 DIAGNOSIS — M79604 Pain in right leg: Secondary | ICD-10-CM | POA: Diagnosis not present

## 2018-05-24 DIAGNOSIS — Z87891 Personal history of nicotine dependence: Secondary | ICD-10-CM | POA: Diagnosis not present

## 2018-05-24 NOTE — Telephone Encounter (Signed)
Copied from [place] [id number]. Topic: General - Other >> May 24, 2018 12:33 PM Judyann Munson wrote: Reason for CRM:Amy- [facility] is calling in verbal order for nursing care: 1x for 3wks, 1x  every other week for 4 wks. Best Contact number is [phone]. Please advise

## 2018-05-25 NOTE — Telephone Encounter (Signed)
OK to give verbal.

## 2018-05-25 NOTE — Telephone Encounter (Signed)
Called and left Amy a VM letting her know that Apolonio Schneiders gave the ok for verbal orders.

## 2018-05-26 ENCOUNTER — Telehealth: Payer: Self-pay | Admitting: Family Medicine

## 2018-05-26 DIAGNOSIS — Z87891 Personal history of nicotine dependence: Secondary | ICD-10-CM | POA: Diagnosis not present

## 2018-05-26 DIAGNOSIS — Z902 Acquired absence of lung [part of]: Secondary | ICD-10-CM | POA: Diagnosis not present

## 2018-05-26 DIAGNOSIS — J449 Chronic obstructive pulmonary disease, unspecified: Secondary | ICD-10-CM | POA: Diagnosis not present

## 2018-05-26 DIAGNOSIS — C3431 Malignant neoplasm of lower lobe, right bronchus or lung: Secondary | ICD-10-CM | POA: Diagnosis not present

## 2018-05-26 DIAGNOSIS — I10 Essential (primary) hypertension: Secondary | ICD-10-CM | POA: Diagnosis not present

## 2018-05-26 DIAGNOSIS — J45909 Unspecified asthma, uncomplicated: Secondary | ICD-10-CM | POA: Diagnosis not present

## 2018-05-26 DIAGNOSIS — Z791 Long term (current) use of non-steroidal anti-inflammatories (NSAID): Secondary | ICD-10-CM | POA: Diagnosis not present

## 2018-05-26 DIAGNOSIS — Z7951 Long term (current) use of inhaled steroids: Secondary | ICD-10-CM | POA: Diagnosis not present

## 2018-05-26 DIAGNOSIS — Z8521 Personal history of malignant neoplasm of larynx: Secondary | ICD-10-CM | POA: Diagnosis not present

## 2018-05-26 DIAGNOSIS — M79604 Pain in right leg: Secondary | ICD-10-CM | POA: Diagnosis not present

## 2018-05-26 DIAGNOSIS — Z9981 Dependence on supplemental oxygen: Secondary | ICD-10-CM | POA: Diagnosis not present

## 2018-05-26 DIAGNOSIS — Z7982 Long term (current) use of aspirin: Secondary | ICD-10-CM | POA: Diagnosis not present

## 2018-05-26 DIAGNOSIS — Z483 Aftercare following surgery for neoplasm: Secondary | ICD-10-CM | POA: Diagnosis not present

## 2018-05-26 DIAGNOSIS — G8929 Other chronic pain: Secondary | ICD-10-CM | POA: Diagnosis not present

## 2018-05-26 DIAGNOSIS — Z96653 Presence of artificial knee joint, bilateral: Secondary | ICD-10-CM | POA: Diagnosis not present

## 2018-05-26 DIAGNOSIS — M79605 Pain in left leg: Secondary | ICD-10-CM | POA: Diagnosis not present

## 2018-05-26 NOTE — Telephone Encounter (Signed)
Copied from [place] [id number]. Topic: General - Other >> May 26, 2018  3:45 PM Carolyn Stare wrote:  Raquel Sarna with [place] call to req verbal orders PT 1X1  2X1  1X2   [phone]

## 2018-05-27 ENCOUNTER — Encounter: Payer: Self-pay | Admitting: *Deleted

## 2018-05-27 ENCOUNTER — Inpatient Hospital Stay: Payer: Medicare Other | Attending: Internal Medicine | Admitting: Internal Medicine

## 2018-05-27 ENCOUNTER — Encounter: Payer: Self-pay | Admitting: Anesthesiology

## 2018-05-27 DIAGNOSIS — Z87891 Personal history of nicotine dependence: Secondary | ICD-10-CM

## 2018-05-27 DIAGNOSIS — R0789 Other chest pain: Secondary | ICD-10-CM | POA: Insufficient documentation

## 2018-05-27 DIAGNOSIS — C3431 Malignant neoplasm of lower lobe, right bronchus or lung: Secondary | ICD-10-CM | POA: Diagnosis not present

## 2018-05-27 DIAGNOSIS — Z7982 Long term (current) use of aspirin: Secondary | ICD-10-CM | POA: Insufficient documentation

## 2018-05-27 DIAGNOSIS — Z79899 Other long term (current) drug therapy: Secondary | ICD-10-CM | POA: Diagnosis not present

## 2018-05-27 DIAGNOSIS — R0602 Shortness of breath: Secondary | ICD-10-CM | POA: Diagnosis not present

## 2018-05-27 DIAGNOSIS — R530 Neoplastic (malignant) related fatigue: Secondary | ICD-10-CM | POA: Diagnosis not present

## 2018-05-27 DIAGNOSIS — Z8521 Personal history of malignant neoplasm of larynx: Secondary | ICD-10-CM | POA: Insufficient documentation

## 2018-05-27 DIAGNOSIS — H2511 Age-related nuclear cataract, right eye: Secondary | ICD-10-CM | POA: Diagnosis not present

## 2018-05-27 NOTE — Telephone Encounter (Signed)
Verbal orders approved for physical therapy.

## 2018-05-27 NOTE — Discharge Instructions (Signed)

## 2018-05-27 NOTE — Telephone Encounter (Signed)
Verbal orders given  

## 2018-05-27 NOTE — Assessment & Plan Note (Addendum)
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  Margins negative.  Clinically this is more likely primary squamous cell carcinoma of the lung rather than metastatic disease from previous laryngeal cancer.  Patient's previous laryngeal cancer was approximately 10 years ago and also stage II.  #Long discussion with the patient and husband regarding my above clinical impression.  Given likely stage I lung cancer-no role for any adjuvant chemotherapy or radiation therapy.  However do recommend surveillance imaging every 6 months for the first 2 to 3 years followed by imaging on annual basis for 5 years.  #Laryngeal cancer stage II 2008-status post chemoradiation; is very unlikely she has recurrence of her cancer in the lung at this time.  See discussion above.  # follow up to TBD after Tumor conference.  Addendum: Discussed with Dr. Valetta Fuller feels patient had a good surgical outcome with negative margins with wedge resection.  Information was relayed to patient/husband by [facility].

## 2018-05-27 NOTE — Progress Notes (Signed)
Susan Houston OFFICE PROGRESS NOTE  Patient Care Team: Valerie Roys, DO as PCP - General (Family Medicine) Anabel Bene, MD as Referring Physician (Neurology) Telford Nab, RN as Registered Nurse  Cancer Staging No matching staging information was found for the patient.   Oncology History   # 2011-laryngeal cancer/SCC; Stage II; April-June 2011 s/p  CRT [Dr.Choski/Bennett]  # 2019- likely STAGE I 10 mm RLL nodule [incidental]; s/p wedge resection-July 2019-stage I squamous cell lung cancer.  Adjuvant therapy.      Laryngeal cancer [facility])    Primary cancer of right lower lobe of lung ([place])   05/27/2018 Initial Diagnosis    Primary cancer of right lower lobe of lung ([place])       INTERVAL HISTORY:  Susan Houston 57 y.o.  female pleasant patient above history of stage II laryngeal cancer and also right lower lobe lung nodule incidentally diagnosed is here for follow-up post wedge resection.  Patient's postoperative recovery has been uneventful-however still needing oxygen.  Complains of pain at the site of surgery.  Her activities are limited because of difficulty breathing.  She continues to be on oxygen.  Review of Systems  Constitutional: Positive for malaise/fatigue. Negative for chills, diaphoresis, fever and weight loss.  HENT: Negative for nosebleeds and sore throat.   Eyes: Negative for double vision.  Respiratory: Positive for shortness of breath. Negative for cough, hemoptysis, sputum production and wheezing.   Cardiovascular: Positive for chest pain (At the site of surgery.). Negative for palpitations, orthopnea and leg swelling.  Gastrointestinal: Negative for abdominal pain, blood in stool, constipation, diarrhea, heartburn, melena, nausea and vomiting.  Genitourinary: Negative for dysuria, frequency and urgency.  Musculoskeletal: Negative for back pain and joint pain.  Skin: Negative.  Negative for itching and rash.  Neurological: Negative for  dizziness, tingling, focal weakness, weakness and headaches.  Endo/Heme/Allergies: Does not bruise/bleed easily.  Psychiatric/Behavioral: Negative for depression. The patient is not nervous/anxious and does not have insomnia.       PAST MEDICAL HISTORY :  Past Medical History:  Diagnosis Date  . Anemia    vitamin b12 deficiency. no longer taking supplements  . Anxiety   . Arthritis   . Asthma   . Back pain   . Bruises easily   . Cancer ([place]) 2009   LARYNX CANCER - CHEMO / RADIATION (NO SURGERY)   . Chronic leg pain    BILATERAL  . COPD (chronic obstructive pulmonary disease) (HCC)    no inhalers for over 1 year  . Depression   . Difficulty sleeping   . Diverticulitis   . Dysrhythmia 04/2018   brady episodes. cleared by dr. Clayborn Bigness  . Facet syndrome, lumbar 03/28/2015  . Hemorrhoids   . History of kidney stones 04/2018   recently passed stone  . Hyperlipidemia   . Hypertension 2019   not on any treatment currently  . Incontinence of urine   . Laryngeal cancer ([place]) 2009  . Lung cancer ([place]) 04/2018   diagnosed via ct scan  . Migraine   . Migraine without aura and without status migrainosus, not intractable 09/11/2016  . Mild cognitive impairment 11/23/2017  . MRSA infection greater than 3 months ago    2008 right side of face  . Nerve damage    right leg. thinks this is from her TKR  . Nodule of lower lobe of right lung 04/22/2018  . Wears dentures    full upper and lower    PAST  SURGICAL HISTORY :   Past Surgical History:  Procedure Laterality Date  . ABDOMINAL HYSTERECTOMY  1983  . BLADDER SUSPENSION  2014  . CATARACT EXTRACTION W/PHACO Left 05/11/2018   Procedure: CATARACT EXTRACTION PHACO AND INTRAOCULAR LENS PLACEMENT ([place]) LEFT;  Surgeon: Leandrew Koyanagi, MD;  Location: [facility];  Service: Ophthalmology;  Laterality: Left;  PREFERS EARLY  . CHOLECYSTECTOMY  1998  . COLONOSCOPY    . ESOPHAGOGASTRODUODENOSCOPY  2015  .  ESOPHAGOGASTRODUODENOSCOPY (EGD) WITH PROPOFOL N/A 11/20/2016   Procedure: ESOPHAGOGASTRODUODENOSCOPY (EGD) WITH PROPOFOL;  Surgeon: Jonathon Bellows, MD;  Location: [facility];  Service: Endoscopy;  Laterality: N/A;  . EXCISION NEUROMA Bilateral 09/24/2014   Procedure: BILATERAL OPEN SAPHENOUS NEURECTOMIES AND OPEN LEFT PERIPATELLA OSETOPHYTECTOMY;  Surgeon: Mauri Pole, MD;  Location: [facility];  Service: Orthopedics;  Laterality: Bilateral;  . EYE SURGERY Left 05/11/2018   cataract extraction  . JOINT REPLACEMENT  2009/2010   BIL TOTAL KNEES  . THORACOTOMY/LOBECTOMY Right 05/16/2018   Procedure: THORACOTOMY/POSSIBLE LOBECTOMY;  Surgeon: Nestor Lewandowsky, MD;  Location: [facility];  Service: Thoracic;  Laterality: Right;  . TONSILLECTOMY    . VIDEO BRONCHOSCOPY N/A 05/16/2018   Procedure: PREOP  BRONCHOSCOPY;  Surgeon: Nestor Lewandowsky, MD;  Location: [facility];  Service: Thoracic;  Laterality: N/A;    FAMILY HISTORY :   Family History  Problem Relation Age of Onset  . Asthma Mother   . Diabetes Mother   . Hyperlipidemia Mother   . Hypertension Mother   . Cirrhosis Mother        Non-alcoholic  . Arthritis Father   . Cancer Father        Bone  . Hyperlipidemia Father   . Hypertension Father   . Anxiety disorder Sister   . Depression Sister   . Kidney Stones Son   . Stroke Maternal Grandmother   . Breast cancer Maternal Grandmother 40  . Cancer Maternal Grandfather        lung  . Pneumonia Paternal Grandmother   . Alzheimer's disease Paternal Grandfather   . Diabetes Sister   . Gout Sister   . Hypertension Sister   . Alcohol abuse Brother   . Heart disease Brother        massive MI    SOCIAL HISTORY:   Social History   Tobacco Use  . Smoking status: Former Smoker    Packs/day: 0.50    Types: Cigarettes    Last attempt to quit: 04/07/2016    Years since quitting: 2.1  . Smokeless tobacco: Never Used  . Tobacco comment: patient has not smoked x 10 days.06/28/17 chantix  Substance  Use Topics  . Alcohol use: No    Alcohol/week: 0.0 standard drinks  . Drug use: No    ALLERGIES:  is allergic to morphine and related; adhesive [tape]; and lexapro [escitalopram oxalate].  MEDICATIONS:  Current Outpatient Medications  Medication Sig Dispense Refill  . aspirin 81 MG tablet Take 81 mg by mouth daily.    Marland Kitchen BREO ELLIPTA 100-25 MCG/INH AEPB INHALE 1 PUFF PO QD  0  . gabapentin (NEURONTIN) 100 MG capsule Take 1 capsule (100 mg total) by mouth 3 (three) times daily. 30 capsule 0  . magnesium hydroxide (MILK OF MAGNESIA) 400 MG/5ML suspension Take 5 mLs by mouth daily as needed for mild constipation or moderate constipation.    . methocarbamol (ROBAXIN) 500 MG tablet Take 500 mg by mouth every 6 (six) hours as needed.   0  . oxyCODONE-acetaminophen (PERCOCET) 7.5-325  MG tablet Take 1 tablet by mouth every 4 (four) hours as needed for severe pain. 30 tablet 0  . Prednisolon-Gatiflox-Bromfenac 1-0.5-0.075 % SUSP Apply 1 drop to eye 2 (two) times daily. 1 Bottle 00  . SPIRIVA HANDIHALER 18 MCG inhalation capsule INL CONTENTS OF 1 C ONCE DAILY USING HANDIHALER  0  . tiZANidine (ZANAFLEX) 4 MG capsule Take 2 mg by mouth at bedtime.     . traMADol (ULTRAM) 50 MG tablet TK 1-2 TS PO Q 6 H  0  . traZODone (DESYREL) 50 MG tablet Take 25 mg by mouth at bedtime.      No current facility-administered medications for this visit.     PHYSICAL EXAMINATION: ECOG PERFORMANCE STATUS: 1 - Symptomatic but completely ambulatory  BP 137/82 (BP Location: Left Arm, Patient Position: Sitting)   Pulse 74   Temp 98.7 F (37.1 C) (Tympanic)   Resp 20   Ht 5\' 2"  (1.575 m)   Wt 192 lb (87.1 kg)   BMI 35.12 kg/m   Filed Weights   05/27/18 1148  Weight: 192 lb (87.1 kg)    GENERAL: Well-nourished well-developed; Alert, no distress and comfortable.  Accompanied by husband. EYES: no pallor or icterus OROPHARYNX: no thrush or ulceration; NECK: supple; no lymph nodes felt. LYMPH:  no palpable  lymphadenopathy in the axillary or inguinal regions LUNGS: Decreased breath sounds auscultation bilaterally. No wheeze or crackles HEART/CVS: regular rate & rhythm and no murmurs; No lower extremity edema ABDOMEN:abdomen soft, non-tender and normal bowel sounds. No hepatomegaly or splenomegaly.  Musculoskeletal:no cyanosis of digits and no clubbing  PSYCH: alert & oriented x 3 with fluent speech NEURO: no focal motor/sensory deficits SKIN:  no rashes or significant lesions; surgical incision noted no signs of infection.  Staples in place.    LABORATORY DATA:  I have reviewed the data as listed    Component Value Date/Time   NA 137 05/18/2018 1030   NA 137 12/03/2017 1447   NA 137 05/14/2014 1457   K 4.7 05/18/2018 1030   K 3.2 (L) 05/14/2014 1457   CL 101 05/18/2018 1030   CL 103 05/14/2014 1457   CO2 29 05/18/2018 1030   CO2 28 05/14/2014 1457   GLUCOSE 103 (H) 05/18/2018 1030   GLUCOSE 80 05/14/2014 1457   BUN 9 05/18/2018 1030   BUN 8 12/03/2017 1447   BUN 8 05/14/2014 1457   CREATININE 0.60 05/18/2018 1030   CREATININE 0.79 05/14/2014 1457   CALCIUM 8.7 (L) 05/18/2018 1030   CALCIUM 8.9 05/14/2014 1457   PROT 7.1 05/12/2018 0851   PROT 6.8 12/03/2017 1447   PROT 7.0 05/14/2014 1457   ALBUMIN 3.5 05/12/2018 0851   ALBUMIN 3.8 12/03/2017 1447   ALBUMIN 3.3 (L) 05/14/2014 1457   AST 19 05/12/2018 0851   AST 9 (L) 05/14/2014 1457   ALT 15 05/12/2018 0851   ALT 15 05/14/2014 1457   ALKPHOS 89 05/12/2018 0851   ALKPHOS 87 05/14/2014 1457   BILITOT 0.5 05/12/2018 0851   BILITOT 0.6 12/03/2017 1447   BILITOT 0.2 05/14/2014 1457   GFRNONAA >60 05/18/2018 1030   GFRNONAA >60 05/14/2014 1457   GFRAA >60 05/18/2018 1030   GFRAA >60 05/14/2014 1457    No results found for: SPEP, UPEP  Lab Results  Component Value Date   WBC 8.6 05/18/2018   NEUTROABS 2.3 12/03/2017   HGB 11.3 (L) 05/18/2018   HCT 33.7 (L) 05/18/2018   MCV 89.6 05/18/2018   PLT 229 05/18/2018  Chemistry      Component Value Date/Time   NA 137 05/18/2018 1030   NA 137 12/03/2017 1447   NA 137 05/14/2014 1457   K 4.7 05/18/2018 1030   K 3.2 (L) 05/14/2014 1457   CL 101 05/18/2018 1030   CL 103 05/14/2014 1457   CO2 29 05/18/2018 1030   CO2 28 05/14/2014 1457   BUN 9 05/18/2018 1030   BUN 8 12/03/2017 1447   BUN 8 05/14/2014 1457   CREATININE 0.60 05/18/2018 1030   CREATININE 0.79 05/14/2014 1457      Component Value Date/Time   CALCIUM 8.7 (L) 05/18/2018 1030   CALCIUM 8.9 05/14/2014 1457   ALKPHOS 89 05/12/2018 0851   ALKPHOS 87 05/14/2014 1457   AST 19 05/12/2018 0851   AST 9 (L) 05/14/2014 1457   ALT 15 05/12/2018 0851   ALT 15 05/14/2014 1457   BILITOT 0.5 05/12/2018 0851   BILITOT 0.6 12/03/2017 1447   BILITOT 0.2 05/14/2014 1457       RADIOGRAPHIC STUDIES: I have personally reviewed the radiological images as listed and agreed with the findings in the report. No results found.   ASSESSMENT & PLAN:  Primary cancer of right lower lobe of lung (HCC) #Squamous cell carcinoma of the right lower lobe-status post wedge resection.  Margins negative.  Clinically this is more likely primary squamous cell carcinoma of the lung rather than metastatic disease from previous laryngeal cancer.  Patient's previous laryngeal cancer was approximately 10 years ago and also stage II.  #Long discussion with the patient and husband regarding my above clinical impression.  Given likely stage I lung cancer-no role for any adjuvant chemotherapy or radiation therapy.  However do recommend surveillance imaging every 6 months for the first 2 to 3 years followed by imaging on annual basis for 5 years.  #Laryngeal cancer stage II 2008-status post chemoradiation; is very unlikely she has recurrence of her cancer in the lung at this time.  See discussion above.  # follow up to TBD after Tumor conference.  Addendum: Discussed with Dr. Valetta Fuller feels patient had a good surgical  outcome with negative margins with wedge resection.  Information was relayed to patient/husband by [facility].   No orders of the defined types were placed in this encounter.  All questions were answered. The patient knows to call the clinic with any problems, questions or concerns.      Cammie Sickle, MD 05/29/2018 3:47 PM

## 2018-05-28 DIAGNOSIS — C3490 Malignant neoplasm of unspecified part of unspecified bronchus or lung: Secondary | ICD-10-CM | POA: Diagnosis not present

## 2018-05-30 ENCOUNTER — Telehealth: Payer: Self-pay | Admitting: Family Medicine

## 2018-05-30 NOTE — Telephone Encounter (Signed)
Copied from [place] [phone]. Topic: General - Other >> May 30, 2018  3:40 PM Judyann Munson wrote: Raquel Sarna with [facility] call to  advise the the patient has declined physical therapy   Cb#: [phone]

## 2018-05-30 NOTE — Telephone Encounter (Signed)
OK. Patient will need to let us know if she changes her mind.

## 2018-05-30 NOTE — Progress Notes (Signed)
  Oncology Nurse Navigator Documentation  Navigator Location: [facility] (05/27/18 1200)   )Navigator Encounter Type: Follow-up Appt (05/27/18 1200)     Confirmed Diagnosis Date: 05/19/18 (05/27/18 1200) Surgery Date: 05/16/18 (05/27/18 1200)           Treatment Initiated Date: 05/16/18 (05/27/18 1200) Patient Visit Type: MedOnc (05/27/18 1200) Treatment Phase: Post-Tx Follow-up (05/27/18 1200) Barriers/Navigation Needs: Coordination of Care (05/27/18 1200)   Interventions: Coordination of Care (05/27/18 1200)   Coordination of Care: Appts (05/27/18 1200)         met with patient and spouse during follow up visit with Dr. Rogue Bussing. All results from recent surgery were discussed with patient. All questions answered at the time of visit. Informed pt and spouse that will be notified with next steps after case is reviewed at case conference on Thursday 8/15. Informed that will call Thursday afternoon and Friday to review scheduled appts for follow up. Pt and spouse verbalized understanding. Nothing further needed at this time.         Time Spent with Patient: 60 (05/27/18 1200)

## 2018-05-31 ENCOUNTER — Ambulatory Visit (INDEPENDENT_AMBULATORY_CARE_PROVIDER_SITE_OTHER): Payer: Medicare Other | Admitting: Cardiothoracic Surgery

## 2018-05-31 ENCOUNTER — Ambulatory Visit
Admission: RE | Admit: 2018-05-31 | Discharge: 2018-05-31 | Disposition: A | Discharge status: Home / Self Care | Payer: Medicare Other | Source: Ambulatory Visit | Attending: Cardiothoracic Surgery | Admitting: Cardiothoracic Surgery

## 2018-05-31 ENCOUNTER — Encounter: Payer: Self-pay | Admitting: Cardiothoracic Surgery

## 2018-05-31 VITALS — BP 125/84 | HR 77 | Temp 97.7°F | Wt 192.0 lb

## 2018-05-31 DIAGNOSIS — J90 Pleural effusion, not elsewhere classified: Secondary | ICD-10-CM

## 2018-05-31 DIAGNOSIS — Z9889 Other specified postprocedural states: Secondary | ICD-10-CM | POA: Diagnosis not present

## 2018-05-31 MED ORDER — OXYCODONE-ACETAMINOPHEN 7.5-325 MG PO TABS: 1.0000 | ORAL_TABLET | ORAL | 0 refills | Status: DC | PRN

## 2018-05-31 MED ORDER — TRAMADOL HCL 50 MG PO TABS: 50.0000 mg | ORAL_TABLET | Freq: Four times a day (QID) | ORAL | 0 refills | Status: DC

## 2018-05-31 MED ORDER — GABAPENTIN 100 MG PO CAPS: 100.0000 mg | ORAL_CAPSULE | Freq: Three times a day (TID) | ORAL | 0 refills | Status: DC

## 2018-05-31 NOTE — Progress Notes (Signed)
She came back today in follow-up.  She did see Dr. Lynett Fish last week.  He did not recommend any additional therapy.  The discussion that he and I had confirmed our concerns that this was a primary lung cancer or a metastatic lesion but it is most likely to be a primary lung.  He did not recommend any additional chemotherapy or radiation therapy but continued surveillance.  The patient came in today with complaints of shortness of breath and chest pain.  She thought she had some swelling along her incision.  However when I examined her wound I thought that they looked normal.  She did have diminished breath sounds on the right.  Her heart was regular.  Her incisions are all healed as expected.  We removed her staples and placed Steri-Strips.  I will get a chest x-ray today.  She will continue her follow-up with Dr. Lynett Fish.  We did refill her Percocet, Robaxin, tramadol, Neurontin.

## 2018-06-01 ENCOUNTER — Encounter: Admission: RE | Payer: Self-pay | Source: Ambulatory Visit

## 2018-06-01 ENCOUNTER — Ambulatory Visit: Admission: RE | Admit: 2018-06-01 | Payer: Medicare Other | Source: Ambulatory Visit | Admitting: Ophthalmology

## 2018-06-01 SURGERY — PHACOEMULSIFICATION, CATARACT, WITH IOL INSERTION
Anesthesia: Topical | Laterality: Right

## 2018-06-02 ENCOUNTER — Encounter: Payer: Self-pay | Admitting: Internal Medicine

## 2018-06-02 ENCOUNTER — Ambulatory Visit: Payer: Medicare Other | Admitting: Internal Medicine

## 2018-06-02 ENCOUNTER — Telehealth: Payer: Self-pay | Admitting: *Deleted

## 2018-06-02 VITALS — BP 110/70 | HR 77 | Ht 62.0 in | Wt 193.0 lb

## 2018-06-02 DIAGNOSIS — Z87891 Personal history of nicotine dependence: Secondary | ICD-10-CM | POA: Diagnosis not present

## 2018-06-02 DIAGNOSIS — M79604 Pain in right leg: Secondary | ICD-10-CM | POA: Diagnosis not present

## 2018-06-02 DIAGNOSIS — Z791 Long term (current) use of non-steroidal anti-inflammatories (NSAID): Secondary | ICD-10-CM | POA: Diagnosis not present

## 2018-06-02 DIAGNOSIS — I10 Essential (primary) hypertension: Secondary | ICD-10-CM | POA: Diagnosis not present

## 2018-06-02 DIAGNOSIS — J9611 Chronic respiratory failure with hypoxia: Secondary | ICD-10-CM

## 2018-06-02 DIAGNOSIS — J449 Chronic obstructive pulmonary disease, unspecified: Secondary | ICD-10-CM | POA: Diagnosis not present

## 2018-06-02 DIAGNOSIS — Z96653 Presence of artificial knee joint, bilateral: Secondary | ICD-10-CM | POA: Diagnosis not present

## 2018-06-02 DIAGNOSIS — Z483 Aftercare following surgery for neoplasm: Secondary | ICD-10-CM | POA: Diagnosis not present

## 2018-06-02 DIAGNOSIS — Z9981 Dependence on supplemental oxygen: Secondary | ICD-10-CM | POA: Diagnosis not present

## 2018-06-02 DIAGNOSIS — J45909 Unspecified asthma, uncomplicated: Secondary | ICD-10-CM | POA: Diagnosis not present

## 2018-06-02 DIAGNOSIS — Z7982 Long term (current) use of aspirin: Secondary | ICD-10-CM | POA: Diagnosis not present

## 2018-06-02 DIAGNOSIS — G8929 Other chronic pain: Secondary | ICD-10-CM | POA: Diagnosis not present

## 2018-06-02 DIAGNOSIS — Z902 Acquired absence of lung [part of]: Secondary | ICD-10-CM | POA: Diagnosis not present

## 2018-06-02 DIAGNOSIS — Z7951 Long term (current) use of inhaled steroids: Secondary | ICD-10-CM | POA: Diagnosis not present

## 2018-06-02 DIAGNOSIS — C3431 Malignant neoplasm of lower lobe, right bronchus or lung: Secondary | ICD-10-CM | POA: Diagnosis not present

## 2018-06-02 DIAGNOSIS — M79605 Pain in left leg: Secondary | ICD-10-CM | POA: Diagnosis not present

## 2018-06-02 DIAGNOSIS — Z8521 Personal history of malignant neoplasm of larynx: Secondary | ICD-10-CM | POA: Diagnosis not present

## 2018-06-02 MED ORDER — AZITHROMYCIN 250 MG PO TABS: ORAL_TABLET | ORAL | 0 refills | Status: DC

## 2018-06-02 MED ORDER — PREDNISONE 20 MG PO TABS: 20.0000 mg | ORAL_TABLET | Freq: Every day | ORAL | 0 refills | Status: DC

## 2018-06-02 NOTE — Progress Notes (Signed)
Name: Susan Houston MRN: [id number] DOB: Dec 19, 1960     CONSULTATION DATE:  REFERRING MD :   CHIEF COMPLAINT:   STUDIES:     CXR independently reviewed by Me    7.9.19 CT chest Independently reviewed by Me RLL lung nodule seen  PFT 04/2018 Ratio 63% FEV1 76% FEF 25/75 41% Mild to moderate obstructive lung disease   HISTORY OF PRESENT ILLNESS:  57 yo Patient has initially had hematuria and found to have incidental finding of RLL nodule then follow up CT scan confirmed nodule   with recent admission to the hospital on July 29 where she underwent a right thoracotomy and wedge resection of a right lower lobe mass.  The final pathology revealed a squamous cell carcinoma.  She had previously had a head neck cancer treated with radiation therapy and chemotherapy several years ago.    Hospital Course: She was admitted to the intensive care unit postoperatively where she was nursed overnight.  Her chest tube was removed on postoperative day #3.  She had some problems with hypoxia and she was treated with oxygen therapy and was discharged home on 2 L of oxygen therapy.    She was  recommended Spiriva and Breo for her postoperative management of her COPD.    At the time of discharge she was placed on Percocet 02/19/2024 1 to 2 tablets as needed for pain.   She was also given prescriptions for tramadol 100 mg every 6 hours and Robaxin 500 mg every 6 hours.   Prescription for Breo and Spiriva.   Patient with hypoxia post discharged and needs oxygen at this time Stays SOB, has some mild productive cough and wheezing Mild acute COPD exacerbation now  Overall, much improved since admission  PAST MEDICAL HISTORY :   has a past medical history of Anemia, Anxiety, Arthritis, Asthma, Back pain, Bruises easily, Cancer ([place]) (2009), Chronic leg pain, COPD (chronic obstructive pulmonary disease) ([place]), Depression, Difficulty sleeping, Diverticulitis, Dysrhythmia (04/2018), Facet syndrome, lumbar  (03/28/2015), Hemorrhoids, History of kidney stones (04/2018), Hyperlipidemia, Hypertension (2019), Incontinence of urine, Laryngeal cancer ([place]) (2009), Lung cancer ([place]) (04/2018), Migraine, Migraine without aura and without status migrainosus, not intractable (09/11/2016), Mild cognitive impairment (11/23/2017), MRSA infection greater than 3 months ago, Nerve damage, Nodule of lower lobe of right lung (04/22/2018), and Wears dentures.  has a past surgical history that includes Bladder suspension (2014); Tonsillectomy; Excision neuroma (Bilateral, 09/24/2014); Esophagogastroduodenoscopy (2015); Colonoscopy; Esophagogastroduodenoscopy (egd) with propofol (N/A, 11/20/2016); Joint replacement (2009/2010); Abdominal hysterectomy (1983); Eye surgery (Left, 05/11/2018); Cholecystectomy (1998); Cataract extraction w/PHACO (Left, 05/11/2018); Thoracotomy/lobectomy (Right, 05/16/2018); and Video bronchoscopy (N/A, 05/16/2018). Prior to Admission medications   Medication Sig Start Date End Date Taking? Authorizing Provider  aspirin 81 MG tablet Take 81 mg by mouth daily.    [provider]  BREO ELLIPTA 100-25 MCG/INH AEPB INHALE 1 PUFF PO QD 05/20/18   [provider]  gabapentin (NEURONTIN) 100 MG capsule Take 1 capsule (100 mg total) by mouth 3 (three) times daily. 05/23/18   Nestor Lewandowsky, MD  gabapentin (NEURONTIN) 100 MG capsule Take 1 capsule (100 mg total) by mouth 3 (three) times daily. 05/31/18   Nestor Lewandowsky, MD  magnesium hydroxide (MILK OF MAGNESIA) 400 MG/5ML suspension Take 5 mLs by mouth daily as needed for mild constipation or moderate constipation.    [provider]  methocarbamol (ROBAXIN) 500 MG tablet Take 500 mg by mouth every 6 (six) hours as needed.  05/20/18   [provider]  oxyCODONE-acetaminophen (PERCOCET) 7.5-325 MG tablet Take 1 tablet by mouth every 4 (four) hours as needed for severe pain. 05/31/18   Nestor Lewandowsky, MD  Prednisolon-Gatiflox-Bromfenac 1-0.5-0.075  % SUSP Apply 1 drop to eye 2 (two) times daily. 05/20/18   Nestor Lewandowsky, MD  SPIRIVA HANDIHALER 18 MCG inhalation capsule INL CONTENTS OF 1 C ONCE DAILY USING HANDIHALER 05/20/18   [provider]  tiZANidine (ZANAFLEX) 4 MG capsule Take 2 mg by mouth at bedtime.     [provider]  traMADol (ULTRAM) 50 MG tablet TK 1-2 TS PO Q 6 H 05/20/18   [provider]  traMADol (ULTRAM) 50 MG tablet Take 1 tablet (50 mg total) by mouth 4 (four) times daily. 05/31/18 05/31/19  Nestor Lewandowsky, MD  traZODone (DESYREL) 50 MG tablet Take 25 mg by mouth at bedtime.     [provider]   Allergies  Allergen Reactions  . Morphine And Related Itching  . Adhesive [Tape] Other (See Comments)    Plastic tape rips skin and bruises her. PLEASE USE PAPER TAPE  . Lexapro [Escitalopram Oxalate] Other (See Comments)    Keeps awake for days.     FAMILY HISTORY:  family history includes Alcohol abuse in her brother; Alzheimer's disease in her paternal grandfather; Anxiety disorder in her sister; Arthritis in her father; Asthma in her mother; Breast cancer (age of onset: 25) in her maternal grandmother; Cancer in her father and maternal grandfather; Cirrhosis in her mother; Depression in her sister; Diabetes in her mother and sister; Gout in her sister; Heart disease in her brother; Hyperlipidemia in her father and mother; Hypertension in her father, mother, and sister; Kidney Stones in her son; Pneumonia in her paternal grandmother; Stroke in her maternal grandmother. SOCIAL HISTORY:  reports that she quit smoking about 2 years ago. Her smoking use included cigarettes. She smoked 0.50 packs per day. She has never used smokeless tobacco. She reports that she does not drink alcohol or use drugs.  REVIEW OF SYSTEMS:   Constitutional: Negative for fever, chills, weight loss, malaise/fatigue and diaphoresis.  HENT: Negative for hearing loss, ear pain, nosebleeds, congestion, sore throat, neck pain,  tinnitus and ear discharge.   Eyes: Negative for blurred vision, double vision, photophobia, pain, discharge and redness.  Respiratory: +cough, -hemoptysis, +sputum production,+ shortness of breath, +wheezing and stridor.   Cardiovascular: Negative for chest pain, palpitations, orthopnea, claudication, leg swelling and PND.  Gastrointestinal: Negative for heartburn, nausea, vomiting, abdominal pain, diarrhea, constipation, blood in stool and melena.  Genitourinary: Negative for dysuria, urgency, frequency, hematuria and flank pain.  Musculoskeletal: Negative for myalgias, back pain, joint pain and falls.  Skin: Negative for itching and rash.  Neurological: Negative for dizziness, tingling, tremors, sensory change, speech change, focal weakness, seizures, loss of consciousness, weakness and headaches.  Endo/Heme/Allergies: Negative for environmental allergies and polydipsia. Does not bruise/bleed easily.  ALL OTHER ROS ARE NEGATIVE  Ht 5\' 2"  (1.575 m)   Wt 193 lb (87.5 kg)   BMI 35.30 kg/m   Physical Examination:   GENERAL:NAD, no fevers, chills, no weakness no fatigue HEAD: Normocephalic, atraumatic.  EYES: Pupils equal, round, reactive to light. Extraocular muscles intact. No scleral icterus.  MOUTH: Moist mucosal membrane.   EAR, NOSE, THROAT: Clear without exudates. No external lesions.  NECK: Supple. No thyromegaly. No nodules. No JVD.  PULMONARY:CTA B/L no wheezes, no crackles, no rhonchi CARDIOVASCULAR: S1 and S2. Regular rate and rhythm. No murmurs, rubs, or gallops. No edema.  GASTROINTESTINAL: Soft,  nontender, nondistended. No masses. Positive bowel sounds.  MUSCULOSKELETAL: No swelling, clubbing, or edema. Range of motion full in all extremities.  NEUROLOGIC: Cranial nerves II through XII are intact. No gross focal neurological deficits.  SKIN: No ulceration, lesions, rashes, or cyanosis. Skin warm and dry. Turgor intact.  PSYCHIATRIC: Mood, affect within normal limits. The  patient is awake, alert and oriented x 3. Insight, judgment intact.      ASSESSMENT / PLAN: 57 year old pleasant white female seen today for postop resection of right lower lobe lung nodule consistent with squamous cell carcinoma in the setting of mild to moderate COPD with postop hypoxic respiratory failure  #1 shortness of breath Likely related to morbid obesity COPD and deconditioned state  #2 mild to moderate COPD Gold stage B Continue inhalers as prescribed with Breo and Spiriva Albuterol as needed  #3 chronic hypoxic respiratory failure Patient will need to be reassessed for hypoxia with 6-minute walk test and Ono on room air at next visit Continue oxygen therapy as prescribed  #4 obesity -recommend significant weight loss -recommend changing diet  #5 deconditioned state -Recommend increased daily activity and exercise       Patient/Family are satisfied with Plan of action and management. All questions answered Follow-up in 4 weeks for reassessment  Corrin Parker, M.D.  [facility]  Medical Director [place] Director [facility] Department

## 2018-06-02 NOTE — Telephone Encounter (Signed)
Case discussed at cancer case conference today. Recommendations were that patient has stage 1 lung cancer that is unrelated to history of laryngeal cancer. At this time, pt does not require any further treatment after lung resection by Dr. Genevive Bi. Pt will need to follow up with Dr. Rogue Bussing in 3 months to plan future surveillance scans. Message left with pt's spouse with recommendations. Informed will be notified with appt to follow up in 3 months. Instructed to call with any further questions.

## 2018-06-02 NOTE — Patient Instructions (Signed)
Continue oxygen therapy as prescribed Continue inhalers Start Prednisone 20 mg daily for 7 days Z pak ABX

## 2018-06-06 DIAGNOSIS — Z483 Aftercare following surgery for neoplasm: Secondary | ICD-10-CM | POA: Diagnosis not present

## 2018-06-06 DIAGNOSIS — G8929 Other chronic pain: Secondary | ICD-10-CM | POA: Diagnosis not present

## 2018-06-06 DIAGNOSIS — M79605 Pain in left leg: Secondary | ICD-10-CM | POA: Diagnosis not present

## 2018-06-06 DIAGNOSIS — Z7951 Long term (current) use of inhaled steroids: Secondary | ICD-10-CM | POA: Diagnosis not present

## 2018-06-06 DIAGNOSIS — I10 Essential (primary) hypertension: Secondary | ICD-10-CM | POA: Diagnosis not present

## 2018-06-06 DIAGNOSIS — C3431 Malignant neoplasm of lower lobe, right bronchus or lung: Secondary | ICD-10-CM | POA: Diagnosis not present

## 2018-06-06 DIAGNOSIS — Z902 Acquired absence of lung [part of]: Secondary | ICD-10-CM | POA: Diagnosis not present

## 2018-06-06 DIAGNOSIS — Z9981 Dependence on supplemental oxygen: Secondary | ICD-10-CM | POA: Diagnosis not present

## 2018-06-06 DIAGNOSIS — Z791 Long term (current) use of non-steroidal anti-inflammatories (NSAID): Secondary | ICD-10-CM | POA: Diagnosis not present

## 2018-06-06 DIAGNOSIS — Z87891 Personal history of nicotine dependence: Secondary | ICD-10-CM | POA: Diagnosis not present

## 2018-06-06 DIAGNOSIS — Z8521 Personal history of malignant neoplasm of larynx: Secondary | ICD-10-CM | POA: Diagnosis not present

## 2018-06-06 DIAGNOSIS — J45909 Unspecified asthma, uncomplicated: Secondary | ICD-10-CM | POA: Diagnosis not present

## 2018-06-06 DIAGNOSIS — M79604 Pain in right leg: Secondary | ICD-10-CM | POA: Diagnosis not present

## 2018-06-06 DIAGNOSIS — J449 Chronic obstructive pulmonary disease, unspecified: Secondary | ICD-10-CM | POA: Diagnosis not present

## 2018-06-06 DIAGNOSIS — Z96653 Presence of artificial knee joint, bilateral: Secondary | ICD-10-CM | POA: Diagnosis not present

## 2018-06-06 DIAGNOSIS — Z7982 Long term (current) use of aspirin: Secondary | ICD-10-CM | POA: Diagnosis not present

## 2018-06-10 ENCOUNTER — Ambulatory Visit: Payer: Self-pay | Admitting: Cardiothoracic Surgery

## 2018-06-14 ENCOUNTER — Telehealth: Payer: Self-pay | Admitting: *Deleted

## 2018-06-14 MED ORDER — OXYCODONE-ACETAMINOPHEN 5-325 MG PO TABS: 1.0000 | ORAL_TABLET | ORAL | 0 refills | Status: DC | PRN

## 2018-06-14 NOTE — Telephone Encounter (Signed)
Patient called and stated that she has one pill left of oxycodone and would like to get some pain medication. She stated that the oxycodone does not do well and she would like to get something different. Patient stated that she has had another type of pain medication in the past but can not remember the name and she seemed to like that the best. All the patient could state about it was that it was an older pain medication that is not prescribed that much anymore.

## 2018-06-14 NOTE — Telephone Encounter (Signed)
Called Dr. Genevive Bi and asked what could we do for the patient. I told Dr. Genevive Bi that patient was requesting more pain medication (Oxycodone) but would rather have an old pain medication that had been given to her. I told Dr. Genevive Bi that the other pain medication that I had seen on her chart was Fentanyl. However, Dr. Genevive Bi stated that he would not prescribe her that and that he would only give her a refill on her Oxycodone-Acetaminophen but it will have to be 5-325 MG #30 tabs. He also stated that he would want for the patient to see her PCP for pain management in the meantime and to refer her out to be seen at the Pain Clinic. I told him that I would take care of it. I then called the patient to inform her what Dr. Genevive Bi had stated and she was fine with the decision. Referral will be sent out and her prescription will be left at the front desk. Patient had no further questions.

## 2018-06-16 ENCOUNTER — Encounter: Payer: Self-pay | Admitting: Internal Medicine

## 2018-06-16 DIAGNOSIS — J449 Chronic obstructive pulmonary disease, unspecified: Secondary | ICD-10-CM | POA: Diagnosis not present

## 2018-06-16 DIAGNOSIS — R0902 Hypoxemia: Secondary | ICD-10-CM | POA: Diagnosis not present

## 2018-06-21 DIAGNOSIS — Z483 Aftercare following surgery for neoplasm: Secondary | ICD-10-CM | POA: Diagnosis not present

## 2018-06-21 DIAGNOSIS — Z96653 Presence of artificial knee joint, bilateral: Secondary | ICD-10-CM | POA: Diagnosis not present

## 2018-06-21 DIAGNOSIS — M79605 Pain in left leg: Secondary | ICD-10-CM | POA: Diagnosis not present

## 2018-06-21 DIAGNOSIS — Z9981 Dependence on supplemental oxygen: Secondary | ICD-10-CM | POA: Diagnosis not present

## 2018-06-21 DIAGNOSIS — C3431 Malignant neoplasm of lower lobe, right bronchus or lung: Secondary | ICD-10-CM | POA: Diagnosis not present

## 2018-06-21 DIAGNOSIS — I10 Essential (primary) hypertension: Secondary | ICD-10-CM | POA: Diagnosis not present

## 2018-06-21 DIAGNOSIS — J449 Chronic obstructive pulmonary disease, unspecified: Secondary | ICD-10-CM | POA: Diagnosis not present

## 2018-06-21 DIAGNOSIS — G8929 Other chronic pain: Secondary | ICD-10-CM | POA: Diagnosis not present

## 2018-06-21 DIAGNOSIS — J45909 Unspecified asthma, uncomplicated: Secondary | ICD-10-CM | POA: Diagnosis not present

## 2018-06-21 DIAGNOSIS — Z902 Acquired absence of lung [part of]: Secondary | ICD-10-CM | POA: Diagnosis not present

## 2018-06-21 DIAGNOSIS — Z87891 Personal history of nicotine dependence: Secondary | ICD-10-CM | POA: Diagnosis not present

## 2018-06-21 DIAGNOSIS — M79604 Pain in right leg: Secondary | ICD-10-CM | POA: Diagnosis not present

## 2018-06-21 DIAGNOSIS — Z791 Long term (current) use of non-steroidal anti-inflammatories (NSAID): Secondary | ICD-10-CM | POA: Diagnosis not present

## 2018-06-21 DIAGNOSIS — Z8521 Personal history of malignant neoplasm of larynx: Secondary | ICD-10-CM | POA: Diagnosis not present

## 2018-06-21 DIAGNOSIS — Z7982 Long term (current) use of aspirin: Secondary | ICD-10-CM | POA: Diagnosis not present

## 2018-06-21 DIAGNOSIS — Z7951 Long term (current) use of inhaled steroids: Secondary | ICD-10-CM | POA: Diagnosis not present

## 2018-06-22 ENCOUNTER — Emergency Department
Admission: EM | Admit: 2018-06-22 | Discharge: 2018-06-22 | Disposition: A | Discharge status: Home / Self Care | Payer: Medicare Other | Attending: Emergency Medicine | Admitting: Emergency Medicine

## 2018-06-22 ENCOUNTER — Telehealth: Payer: Self-pay | Admitting: *Deleted

## 2018-06-22 ENCOUNTER — Other Ambulatory Visit: Payer: Self-pay

## 2018-06-22 DIAGNOSIS — Z79899 Other long term (current) drug therapy: Secondary | ICD-10-CM | POA: Insufficient documentation

## 2018-06-22 DIAGNOSIS — C3431 Malignant neoplasm of lower lobe, right bronchus or lung: Secondary | ICD-10-CM | POA: Diagnosis not present

## 2018-06-22 DIAGNOSIS — J449 Chronic obstructive pulmonary disease, unspecified: Secondary | ICD-10-CM | POA: Diagnosis not present

## 2018-06-22 DIAGNOSIS — G8928 Other chronic postprocedural pain: Secondary | ICD-10-CM | POA: Insufficient documentation

## 2018-06-22 DIAGNOSIS — G8918 Other acute postprocedural pain: Secondary | ICD-10-CM | POA: Diagnosis not present

## 2018-06-22 DIAGNOSIS — Z96653 Presence of artificial knee joint, bilateral: Secondary | ICD-10-CM | POA: Insufficient documentation

## 2018-06-22 DIAGNOSIS — Z7982 Long term (current) use of aspirin: Secondary | ICD-10-CM | POA: Diagnosis not present

## 2018-06-22 DIAGNOSIS — R0789 Other chest pain: Secondary | ICD-10-CM | POA: Diagnosis not present

## 2018-06-22 DIAGNOSIS — Z87891 Personal history of nicotine dependence: Secondary | ICD-10-CM | POA: Insufficient documentation

## 2018-06-22 DIAGNOSIS — C801 Malignant (primary) neoplasm, unspecified: Secondary | ICD-10-CM

## 2018-06-22 MED ORDER — LIDOCAINE 5 % EX PTCH
1.0000 | MEDICATED_PATCH | CUTANEOUS | Status: DC
  Administered 2018-06-22: 1 via TRANSDERMAL
  Filled 2018-06-22: qty 1

## 2018-06-22 MED ORDER — ORPHENADRINE CITRATE 30 MG/ML IJ SOLN
60.0000 mg | INTRAMUSCULAR | Status: AC
  Administered 2018-06-22: 60 mg via INTRAMUSCULAR
  Filled 2018-06-22: qty 2

## 2018-06-22 MED ORDER — TIZANIDINE HCL 4 MG PO TABS: 2.0000 mg | ORAL_TABLET | Freq: Three times a day (TID) | ORAL | 0 refills | Status: AC

## 2018-06-22 MED ORDER — OXYCODONE-ACETAMINOPHEN 5-325 MG PO TABS: 1.0000 | ORAL_TABLET | Freq: Three times a day (TID) | ORAL | 0 refills | Status: AC | PRN

## 2018-06-22 MED ORDER — KETOROLAC TROMETHAMINE 30 MG/ML IJ SOLN
60.0000 mg | Freq: Once | INTRAMUSCULAR | Status: AC
  Administered 2018-06-22: 60 mg via INTRAMUSCULAR
  Filled 2018-06-22: qty 2

## 2018-06-22 MED ORDER — KETOROLAC TROMETHAMINE 10 MG PO TABS: 10.0000 mg | ORAL_TABLET | Freq: Three times a day (TID) | ORAL | 0 refills | Status: DC

## 2018-06-22 MED ORDER — LIDOCAINE 5 % EX PTCH: 1.0000 | MEDICATED_PATCH | CUTANEOUS | 0 refills | Status: DC

## 2018-06-22 MED ORDER — GABAPENTIN 100 MG PO CAPS: ORAL_CAPSULE | ORAL | 0 refills | Status: DC

## 2018-06-22 MED ORDER — GABAPENTIN 100 MG PO CAPS
200.0000 mg | ORAL_CAPSULE | Freq: Once | ORAL | Status: DC
  Filled 2018-06-22: qty 2

## 2018-06-22 MED ORDER — OXYCODONE-ACETAMINOPHEN 7.5-325 MG PO TABS
1.0000 | ORAL_TABLET | Freq: Once | ORAL | Status: AC
  Administered 2018-06-22: 1 via ORAL
  Filled 2018-06-22: qty 1

## 2018-06-22 NOTE — ED Provider Notes (Signed)
[facility] Emergency Department Provider Note ____________________________________________  Time seen: 1816  I have reviewed the triage vital signs and the nursing notes.  HISTORY  Chief Complaint  Pain Management  HPI Susan Houston is a 57 y.o. female who presents herself to the ED along with her husband, for request for pain management.  Patient who had a right lung lobectomy on July 29 due to cancer, presents for request of pain.  She is under the care of Dr. Genevive Bi, and has been suggested that she be referred to pain management.  The patient has multiple medications that she currently takes for her ongoing pain including gabapentin, tizanidine, tramadol, and trazodone. She has been in touch with her PCP, Dr. Genevive Bi' office today. He wrote her last prescription for Oxycodone 5-325 mg (#30) on 8/27, and would not write any further prescriptions. She is to see his NP on Friday, for further evaluation and referral to pain management. She is only dosing 100 mg of Gabapentin, and only taking tizanidine 2 mg QHS.   Past Medical History:  Diagnosis Date  . Anemia    vitamin b12 deficiency. no longer taking supplements  . Anxiety   . Arthritis   . Asthma   . Back pain   . Bruises easily   . Cancer ([place]) 2009   LARYNX CANCER - CHEMO / RADIATION (NO SURGERY)   . Chronic leg pain    BILATERAL  . COPD (chronic obstructive pulmonary disease) (HCC)    no inhalers for over 1 year  . Depression   . Difficulty sleeping   . Diverticulitis   . Dysrhythmia 04/2018   brady episodes. cleared by dr. Clayborn Bigness  . Facet syndrome, lumbar 03/28/2015  . Hemorrhoids   . History of kidney stones 04/2018   recently passed stone  . Hyperlipidemia   . Hypertension 2019   not on any treatment currently  . Incontinence of urine   . Laryngeal cancer ([place]) 2009  . Lung cancer ([place]) 04/2018   diagnosed via ct scan  . Migraine   . Migraine without aura and without status migrainosus, not  intractable 09/11/2016  . Mild cognitive impairment 11/23/2017  . MRSA infection greater than 3 months ago    2008 right side of face  . Nerve damage    right leg. thinks this is from her TKR  . Nodule of lower lobe of right lung 04/22/2018  . Wears dentures    full upper and lower    Patient Active Problem List   Diagnosis Date Noted  . Primary cancer of right lower lobe of lung ([place]) 05/27/2018  . Lung cancer ([place]) 05/16/2018  . Laryngeal cancer ([place]) 04/22/2018  . Mass of lower lobe of right lung 04/22/2018  . Vitamin B12 deficiency 12/15/2017  . Frequent falls 11/23/2017  . Mild cognitive impairment 11/23/2017  . Migraine without aura and without status migrainosus, not intractable 09/11/2016  . Hyperlipidemia   . COPD (chronic obstructive pulmonary disease) ([place])   . Chronic pain 12/24/2015  . Severe depression ([place]) 12/24/2015  . Chronic anxiety 12/24/2015  . Coarse tremors 12/24/2015  . Facet syndrome, lumbar 03/28/2015  . DDD (degenerative disc disease), lumbar 03/05/2015  . DDD (degenerative disc disease), lumbosacral 02/23/2015  . Sacroiliac joint dysfunction 02/23/2015  . Saphenous neuralgia 09/24/2014    Past Surgical History:  Procedure Laterality Date  . ABDOMINAL HYSTERECTOMY  1983  . BLADDER SUSPENSION  2014  . CATARACT EXTRACTION W/PHACO Left 05/11/2018   Procedure:  CATARACT EXTRACTION PHACO AND INTRAOCULAR LENS PLACEMENT ([place]) LEFT;  Surgeon: Leandrew Koyanagi, MD;  Location: [facility];  Service: Ophthalmology;  Laterality: Left;  PREFERS EARLY  . CHOLECYSTECTOMY  1998  . COLONOSCOPY    . ESOPHAGOGASTRODUODENOSCOPY  2015  . ESOPHAGOGASTRODUODENOSCOPY (EGD) WITH PROPOFOL N/A 11/20/2016   Procedure: ESOPHAGOGASTRODUODENOSCOPY (EGD) WITH PROPOFOL;  Surgeon: Jonathon Bellows, MD;  Location: [facility];  Service: Endoscopy;  Laterality: N/A;  . EXCISION NEUROMA Bilateral 09/24/2014   Procedure: BILATERAL OPEN SAPHENOUS NEURECTOMIES AND OPEN LEFT  PERIPATELLA OSETOPHYTECTOMY;  Surgeon: Mauri Pole, MD;  Location: [facility];  Service: Orthopedics;  Laterality: Bilateral;  . EYE SURGERY Left 05/11/2018   cataract extraction  . JOINT REPLACEMENT  2009/2010   BIL TOTAL KNEES  . THORACOTOMY/LOBECTOMY Right 05/16/2018   Procedure: THORACOTOMY/POSSIBLE LOBECTOMY;  Surgeon: Nestor Lewandowsky, MD;  Location: [facility];  Service: Thoracic;  Laterality: Right;  . TONSILLECTOMY    . VIDEO BRONCHOSCOPY N/A 05/16/2018   Procedure: PREOP  BRONCHOSCOPY;  Surgeon: Nestor Lewandowsky, MD;  Location: [facility];  Service: Thoracic;  Laterality: N/A;    Prior to Admission medications   Medication Sig Start Date End Date Taking? Authorizing Provider  aspirin 81 MG tablet Take 81 mg by mouth daily.    [provider]  BREO ELLIPTA 100-25 MCG/INH AEPB INHALE 1 PUFF PO QD 05/20/18   [provider]  gabapentin (NEURONTIN) 100 MG capsule Take 1 capsule (100 mg total) by mouth 3 (three) times daily. 05/23/18   Nestor Lewandowsky, MD  gabapentin (NEURONTIN) 100 MG capsule Take 100 mg qam; 100 mg midday; 200 mg qhs x 2 days Take 200 mg qam; 100 mg midday; 200 mg qhs x 2 days Take 200 mg qam; 200 mg midday; 200 mg qhs x 1 wk Increase incrementally to a total of 300 mg TID 06/22/18   Colan Laymon, Dannielle Karvonen, PA-C  ketorolac (TORADOL) 10 MG tablet Take 1 tablet (10 mg total) by mouth every 8 (eight) hours. 06/22/18   Manoah Deckard, Dannielle Karvonen, PA-C  lidocaine (LIDODERM) 5 % Place 1 patch onto the skin daily for 7 days. Remove & Discard patch within 12 hours or as directed by MD 06/22/18 06/29/18  Jorrell Kuster, Dannielle Karvonen, PA-C  oxyCODONE-acetaminophen (PERCOCET) 5-325 MG tablet Take 1 tablet by mouth every 8 (eight) hours as needed for up to 3 days for severe pain. 06/22/18 06/25/18  Mackinley Kiehn, Dannielle Karvonen, PA-C  SPIRIVA HANDIHALER 18 MCG inhalation capsule INL CONTENTS OF 1 C ONCE DAILY USING HANDIHALER 05/20/18   [provider]  tiZANidine (ZANAFLEX) 4 MG capsule Take 2 mg  by mouth at bedtime.     [provider]  tiZANidine (ZANAFLEX) 4 MG tablet Take 0.5 tablets (2 mg total) by mouth 3 (three) times daily. 06/22/18 07/22/18  Dion Sibal, Dannielle Karvonen, PA-C  traMADol (ULTRAM) 50 MG tablet TK 1-2 TS PO Q 6 H 05/20/18   [provider]  traZODone (DESYREL) 50 MG tablet Take 25 mg by mouth at bedtime.     [provider]    Allergies Morphine and related; Adhesive [tape]; and Lexapro [escitalopram oxalate]  Family History  Problem Relation Age of Onset  . Asthma Mother   . Diabetes Mother   . Hyperlipidemia Mother   . Hypertension Mother   . Cirrhosis Mother        Non-alcoholic  . Arthritis Father   . Cancer Father        Bone  . Hyperlipidemia  Father   . Hypertension Father   . Anxiety disorder Sister   . Depression Sister   . Kidney Stones Son   . Stroke Maternal Grandmother   . Breast cancer Maternal Grandmother 1  . Cancer Maternal Grandfather        lung  . Pneumonia Paternal Grandmother   . Alzheimer's disease Paternal Grandfather   . Diabetes Sister   . Gout Sister   . Hypertension Sister   . Alcohol abuse Brother   . Heart disease Brother        massive MI    Social History Social History   Tobacco Use  . Smoking status: Former Smoker    Packs/day: 0.50    Types: Cigarettes    Last attempt to quit: 04/07/2016    Years since quitting: 2.2  . Smokeless tobacco: Never Used  . Tobacco comment: patient has not smoked x 10 days.06/28/17 chantix  Substance Use Topics  . Alcohol use: No    Alcohol/week: 0.0 standard drinks  . Drug use: No    Review of Systems  Constitutional: Negative for fever. Cardiovascular: Negative for chest pain. Respiratory: Negative for shortness of breath. Gastrointestinal: Negative for abdominal pain, vomiting and diarrhea. Genitourinary: Negative for dysuria. Musculoskeletal: Negative for back pain. Reports chronic chest wall pain due to surgery. Skin: Negative for  rash. Neurological: Negative for headaches, focal weakness or numbness. ____________________________________________  PHYSICAL EXAM:  VITAL SIGNS: ED Triage Vitals  Enc Vitals Group     BP 06/22/18 1728 (!) 159/80     Pulse Rate 06/22/18 1728 71     Resp 06/22/18 1728 18     Temp 06/22/18 1728 97.9 F (36.6 C)     Temp Source 06/22/18 1728 Oral     SpO2 06/22/18 1728 99 %     Weight 06/22/18 1729 192 lb 15.9 oz (87.5 kg)     Height 06/22/18 1729 5\' 2"  (1.575 m)     Head Circumference --      Peak Flow --      Pain Score 06/22/18 1729 9     Pain Loc --      Pain Edu? --      Excl. in [place]? --     Constitutional: Alert and oriented. Well appearing and in no distress. Head: Normocephalic and atraumatic. Eyes: Conjunctivae are normal. Normal extraocular movements Cardiovascular: Normal rate, regular rhythm. Normal distal pulses. Respiratory: Normal respiratory effort. No wheezes/rales/rhonchi. Well-healed, lateral right chest wall surgical scar, consistent with lobectomy.  Gastrointestinal: Soft and nontender. No distention. Musculoskeletal: Nontender with normal range of motion in all extremities.  Neurologic:  Normal gait without ataxia. Normal speech and language. No gross focal neurologic deficits are appreciated. Skin:  Skin is warm, dry and intact. No rash noted. Psychiatric: Mood and affect are normal. Patient exhibits appropriate insight and judgment. ____________________________________________  PROCEDURES  Procedures Toradol 60 mg IM Norflex 60 mg IM Lidoderm 5% patch topically Oxycodone 7.5-325 mg PO ____________________________________________  INITIAL IMPRESSION / ASSESSMENT AND PLAN / ED COURSE  Patient with ED evaluation and management of chronic ongoing pain secondary to recent lumbar lobectomy secondary to carcinoma.  Patient is under the care of her primary provider and is in the process of being transferred to pain management.  She presents today with a  request for pain medicine.  She is an appointment in 2 days with her PCP.  We had a long discussion about the need for her to manage her pain with nonmedicinal options  as well as using her nonnarcotic pain medicines to help alleviate her overall pain.  Patient will be encouraged up dose her gabapentin that she can reach 300 mg 3 times daily.  She is also encouraged to take her tizanidine up to 3 times a day for muscle spasm and pain.  She will be discharged with a prescription for ketorolac as well as Lidoderm patches to apply topically.  A small prescription for #9, oxycodone 5-3 25 is provided.  Patient is encouraged to dose the medication sparingly only as needed for severe pain.  She is also advised that further prescriptions for narcotics may not be supplied via the ED for this complaint.  Patient and her husband verbalized understanding and have been pleasant during this encounter.  I reviewed the patient's prescription history over the last 12 months in the multi-state controlled substances database(s) that includes [place], [place], [place], [place], [place], [place], [place], [place], [place], [place], [place], [place], [place], and [place].  Results were notable for multiple prescriptions from 8/3 -8/27, no prescriptions since 06/14/18.  ____________________________________________  FINAL CLINICAL IMPRESSION(S) / ED DIAGNOSES  Final diagnoses:  Chronic pain after surgical procedure for malignant neoplasm [facility])      Melvenia Needles, PA-C 06/22/18 1949    Orbie Pyo, MD 06/22/18 [phone]

## 2018-06-22 NOTE — Telephone Encounter (Signed)
Per Rogue Bussing, patient needs to see pulmonology regarding inhaler concerns.

## 2018-06-22 NOTE — Discharge Instructions (Signed)
You will be provided with several prescriptions, some will be increased doses of previous meds. You will also be provided with a pain patch to apply for 24-hours. You will be given a small number of narcotic pain pills, to dose sparingly. You will need to be further managed by your primary provider, and eventually by a pain management specialists. Consider alternative, holistic, and natural remedies and modalities; like acupuncture, massage, meditation, and hypnosis to better manage your pain and recovery. See your provider as planned, and return to the Emergency Department as needed.

## 2018-06-22 NOTE — ED Notes (Addendum)
Pt had surgery on July 29th for cancer of the right lower lobe lung - pt started with pain today and is here for management - she wants to know why she is hurting again and "to make sure nothing is wrong" Pain is located in right mid back - pt c/o difficulty breathing and is on O2 at 2 1/2 liters via n/c

## 2018-06-22 NOTE — Telephone Encounter (Signed)
Patient has been out of [place] for 2 days and states to [place] that she was only to take it for 1 month. Nurse Amy asking if this should be refilled or not. Please advise. Also Amy is requesting a rescue inhaler for patient to use

## 2018-06-22 NOTE — Telephone Encounter (Signed)
Called Dr. Genevive Bi in reference to patient's request. He stated that he was not comfortable prescribing Demerol. In addition, he stated that patient was told that he was not to give her any more pain medication and that she was to call her PCP to be treated for her pain. Dr. Genevive Bi also stated that he would be calling patient on Friday when he was in the office seeing patients.  I then called patient but had to leave her a detailed message letting her know what Dr. Genevive Bi had stated. I also stated that she could call me back in case she had further questions.

## 2018-06-22 NOTE — ED Notes (Signed)
FIRST NURSE NOTE:  Pt c/o pain all over, has hx of cancer, pt wears 2.5L [place] continuous, pt placed on hospital oxygen on arrival at 2L.

## 2018-06-22 NOTE — Telephone Encounter (Signed)
Patient called and was wanting to get more pain medication, She stated that she didn't want the oxycodone anymore that she seems to do better on demerol. I did advise her that the last time we talked to her she needed to follow up with her PCP from pain management but she insisted on talking with Dr.Oaks to see if we prescribe her the Demerol.

## 2018-06-22 NOTE — ED Triage Notes (Signed)
Pt had lung surgery at end of July and was taking percocet. Ran out this week and was referred to pain clinic, has appt Friday. Pt tearful, states pain is severe.

## 2018-06-23 ENCOUNTER — Ambulatory Visit: Payer: Medicare Other | Admitting: Family Medicine

## 2018-06-23 ENCOUNTER — Telehealth: Payer: Self-pay | Admitting: Family Medicine

## 2018-06-23 DIAGNOSIS — C3490 Malignant neoplasm of unspecified part of unspecified bronchus or lung: Secondary | ICD-10-CM | POA: Diagnosis not present

## 2018-06-23 NOTE — Telephone Encounter (Signed)
Copied from [place] [id number]. Topic: Quick Communication - See Telephone Encounter >> Jun 23, 2018  1:50 PM Gardiner Ramus wrote: CRM for notification. See Telephone encounter for: 06/23/18. Patient was seen in the ED 06/22/17 and needs a prior authorization for lidocaine (LIDODERM) 5 % [[id number]] please advise

## 2018-06-23 NOTE — Telephone Encounter (Signed)
Please tell her to pick up the OTC lidoderm patches. They work just as well and will be less expensive.

## 2018-06-23 NOTE — Telephone Encounter (Signed)
I called patient and left message on voice mail regarding need to call her pulmanologist and I also spoke with Amy [facility] Nurse

## 2018-06-24 NOTE — Telephone Encounter (Addendum)
Spoke with patient. She states that the OTC do not help, but the one they placed on her in the hospital relieved pain for 24 hours. Because provider is recommending OTC medication vs P.A. (Also uncomfortable generating P.A. For a medication not written by [place]) patient will need to have Hospital F/U.

## 2018-06-24 NOTE — Telephone Encounter (Signed)
Hospital f/u scheduled for Tuesday

## 2018-06-28 ENCOUNTER — Ambulatory Visit: Payer: Medicare Other | Admitting: Family Medicine

## 2018-06-28 ENCOUNTER — Encounter: Payer: Self-pay | Admitting: Family Medicine

## 2018-06-28 ENCOUNTER — Telehealth: Payer: Self-pay | Admitting: Family Medicine

## 2018-06-28 ENCOUNTER — Other Ambulatory Visit: Payer: Self-pay

## 2018-06-28 VITALS — BP 130/82 | HR 76 | Temp 98.7°F | Ht 62.0 in | Wt 197.0 lb

## 2018-06-28 DIAGNOSIS — C3490 Malignant neoplasm of unspecified part of unspecified bronchus or lung: Secondary | ICD-10-CM | POA: Diagnosis not present

## 2018-06-28 DIAGNOSIS — M5137 Other intervertebral disc degeneration, lumbosacral region: Secondary | ICD-10-CM

## 2018-06-28 DIAGNOSIS — C3431 Malignant neoplasm of lower lobe, right bronchus or lung: Secondary | ICD-10-CM

## 2018-06-28 DIAGNOSIS — G894 Chronic pain syndrome: Secondary | ICD-10-CM

## 2018-06-28 DIAGNOSIS — M5136 Other intervertebral disc degeneration, lumbar region: Secondary | ICD-10-CM

## 2018-06-28 DIAGNOSIS — M47816 Spondylosis without myelopathy or radiculopathy, lumbar region: Secondary | ICD-10-CM

## 2018-06-28 DIAGNOSIS — Z79899 Other long term (current) drug therapy: Secondary | ICD-10-CM

## 2018-06-28 MED ORDER — LIDOCAINE 5 % EX PTCH: 2.0000 | MEDICATED_PATCH | CUTANEOUS | 1 refills | Status: DC

## 2018-06-28 MED ORDER — OXYCODONE ER 9 MG PO C12A: 9.0000 mg | EXTENDED_RELEASE_CAPSULE | Freq: Two times a day (BID) | ORAL | 0 refills | Status: DC

## 2018-06-28 MED ORDER — GABAPENTIN 300 MG PO CAPS: 300.0000 mg | ORAL_CAPSULE | Freq: Three times a day (TID) | ORAL | 3 refills | Status: DC

## 2018-06-28 NOTE — Progress Notes (Signed)
BP 130/82   Pulse 76   Temp 98.7 F (37.1 C) (Oral)   Ht 5\' 2"  (1.575 m)   Wt 197 lb (89.4 kg)   SpO2 98%   BMI 36.03 kg/m    Subjective:    Patient ID: Susan Houston, female    DOB: Dec 06, 1960, 57 y.o.   MRN: [id number]  HPI: Susan Houston is a 57 y.o. female  Chief Complaint  Patient presents with  . Follow-up    f/u   Has been having issues with chronic pain. Had a wedge resection for lung cancer on 05/16/18. Had been having pain and following with Dr. Faith Rogue and Dr. Dorthula Perfect. She had good margins and had been doing well. No additional chemotherapy or radiation was recommended. She continues to have pain at the site of her incision. She has been having issues with her pain management. On 06/14/18- she called the surgeon stating that her oxycodone was not working and wanting to go back on demerol. He discussed with her at that time that he wanted her to see [place] for pain management and have a referral to see the pain clinic. She was referred at that time and given 30 pills of percocet. She called Surgery again on 06/22/18 for more pain medicine and wanting to be switched to demerol. Dr. Faith Rogue refused to give her more pain medicine. She went to the ER at that time and was given lidocaine and small supply of percocet.   ER FOLLOW UP Time since discharge: 6 days Hospital/facility: [facility] Diagnosis: Chronic pain Procedures/tests: CXR Consultants: None New medications: lidocaine, tizanidine Discharge instructions:  Follow up here Status: stable  CHRONIC PAIN  Present dose: None Pain control status: uncontrolled Duration: 1 month Location: middle of her back into her breast on the R side at surgical site Quality: aching and sore Current Pain Level: 7/10 Previous Pain Level: 4/10 Benefit from narcotic medications: yes Interested in weaning off narcotics:no   Stool softners/OTC fiber: yes  Previous pain specialty evaluation: yes- Dr. Primus Bravo in 2017 Non-narcotic analgesic meds: yes- taking  100mg  3x a day Narcotic contract: no  Relevant past medical, surgical, family and social history reviewed and updated as indicated. Interim medical history since our last visit reviewed. Allergies and medications reviewed and updated.  Review of Systems  Constitutional: Negative.   Respiratory: Negative.   Cardiovascular: Negative.   Musculoskeletal: Positive for myalgias. Negative for back pain, gait problem, joint swelling, neck pain and neck stiffness.  Skin: Negative.   Neurological: Negative.   Psychiatric/Behavioral: Negative.     Per HPI unless specifically indicated above     Objective:    BP 130/82   Pulse 76   Temp 98.7 F (37.1 C) (Oral)   Ht 5\' 2"  (1.575 m)   Wt 197 lb (89.4 kg)   SpO2 98%   BMI 36.03 kg/m   Wt Readings from Last 3 Encounters:  06/28/18 197 lb (89.4 kg)  06/22/18 192 lb 15.9 oz (87.5 kg)  06/02/18 193 lb (87.5 kg)    Physical Exam  Constitutional: She is oriented to person, place, and time. She appears well-developed and well-nourished. No distress.  HENT:  Head: Normocephalic and atraumatic.  Right Ear: Hearing normal.  Left Ear: Hearing normal.  Nose: Nose normal.  Eyes: Conjunctivae and lids are normal. Right eye exhibits no discharge. Left eye exhibits no discharge. No scleral icterus.  Cardiovascular: Normal rate, regular rhythm, normal heart sounds and intact distal pulses. Exam reveals no gallop  and no friction rub.  No murmur heard. Pulmonary/Chest: Effort normal and breath sounds normal. No stridor. No respiratory distress. She has no wheezes. She has no rales. She exhibits no tenderness.  Musculoskeletal: Normal range of motion.  Neurological: She is alert and oriented to person, place, and time.  Skin: Skin is warm, dry and intact. Capillary refill takes less than 2 seconds. No rash noted. She is not diaphoretic. No erythema. No pallor.  Psychiatric: She has a normal mood and affect. Her speech is normal and behavior is normal.  Judgment and thought content normal. Cognition and memory are normal.  Nursing note and vitals reviewed.   Results for orders placed or performed during the hospital encounter of 05/16/18  Glucose, capillary  Result Value Ref Range   Glucose-Capillary 101 (H) 70 - 99 mg/dL  Glucose, capillary  Result Value Ref Range   Glucose-Capillary 107 (H) 70 - 99 mg/dL  CBC  Result Value Ref Range   WBC 8.6 3.6 - 11.0 K/uL   RBC 3.76 (L) 3.80 - 5.20 MIL/uL   Hemoglobin 11.3 (L) 12.0 - 16.0 g/dL   HCT 33.7 (L) 35.0 - 47.0 %   MCV 89.6 80.0 - 100.0 fL   MCH 30.1 26.0 - 34.0 pg   MCHC 33.6 32.0 - 36.0 g/dL   RDW 14.4 11.5 - 14.5 %   Platelets 229 150 - 440 K/uL  Basic metabolic panel  Result Value Ref Range   Sodium 137 135 - 145 mmol/L   Potassium 4.7 3.5 - 5.1 mmol/L   Chloride 101 98 - 111 mmol/L   CO2 29 22 - 32 mmol/L   Glucose, Bld 103 (H) 70 - 99 mg/dL   BUN 9 6 - 20 mg/dL   Creatinine, Ser 0.60 0.44 - 1.00 mg/dL   Calcium 8.7 (L) 8.9 - 10.3 mg/dL   GFR calc non Af Amer >60 >60 mL/min   GFR calc Af Amer >60 >60 mL/min   Anion gap 7 5 - 15  ABO/Rh  Result Value Ref Range   ABO/RH(D)      B NEG Performed at [facility], [address] [place], [place] [address]   Surgical pathology  Result Value Ref Range   SURGICAL PATHOLOGY      Surgical Pathology CASE: [id number] PATIENT: Amiley Bogen Surgical Pathology Report     SPECIMEN SUBMITTED: A. Lobe, right lower  CLINICAL HISTORY: None provided  PRE-OPERATIVE DIAGNOSIS: Mass right lower lobe  POST-OPERATIVE DIAGNOSIS: Same as pre-op     DIAGNOSIS: A.  RIGHT LUNG, LOWER LOBE; WEDGE RESECTION: - INVASIVE SQUAMOUS CELL CARCINOMA. - SEE SUMMARY BELOW.  CANCER CASE SUMMARY: LUNG Procedure: Wedge resection Specimen Laterality: Right Tumor Site: Lower lobe Tumor Size: 1.0 cm Invasive Tumor Size: 1.0 cm Tumor Focality: Single tumor Histologic Type: Invasive squamous cell carcinoma Visceral Pleura  Invasion: Not identified Lymphovascular Invasion: Present Direct Invasion of Adjacent Structures: No adjacent structures present Margins: All margins uninvolved by tumor, closest margin, parenchymal (7 mm) Treatment Effect: No known presurgical therapy Regional Lymph nodes: No lymph nodes submitted or found Pathologic Stage Classification ( pTNM, AJCC 8th Edition): pT1 pNX pM not applicable TNM Descriptors: Not applicable  Note: Elastin stain is intact and uninvolved by tumor.  Super pancytokeratin and p40 are immunoreactive while TTF-1 is negative. IHC slides were prepared by [facility] [place] and interpreted by Dr. Luana Shu. All controls stained appropriately.  This test was developed and its performance characteristics determined by [facility]. It has not been  cleared or approved by the [place] Food and Drug Administration. The FDA does not require this test to go through premarket FDA review. This test is used for clinical purposes. It should not be regarded as investigational or for research. This laboratory is certified under the Clinical Laboratory Improvement Amendments (CLIA) as qualified to perform high complexity clinical laboratory testing.    GROSS DESCRIPTION: A. intraoperative Consultation:     Labeled: Right lower lobe     Received: Fresh and placed in formalin     Specimen: Right lower lo be     Pathologic evaluation performed: Frozen section     Diagnosis: Lung, right lower lobe; wedge resection: Non-small cell carcinoma, favor squamous cell carcinoma, 1 cm from closest staple     Communicated to: Dr. Faith Rogue at 8:54 AM on 05/16/2018, Delorse Lek, MD     Tissue submitted: A1 A. Labeled: Right lower lobe Received: Fresh and placed in formalin Type of procedure: Wedge Laterality: Right Weight of specimen: 7 grams Size of specimen: 6.2 x 4.1 x 0.8 cm Other attached structures: 6.2 cm long staple line Specimen integrity: Grossly intact Orientation: Pleural-blue and  parenchymal margin-orange Number of masses: 1 Size(s) of mass(es): 1.8 x 1.5 x 0.7 cm Location of mass(es): Subpleural Description of mass(es): Ill-defined firm tan and abutting pleura Relationship of mass(es) to bronchus: Cannot be determined Margins: Abutting pleura Parenchymal margin: 1.0 cm from staple line Other margins: Abutting pleura Relationship / distance of mass(es) to pleura: Abutt ing description of remainder of lung: crepitance red-tan  Block summary: 1 - frozen section remnant 2-4 - additional representative sections with parenchymal and pleural margins     Final Diagnosis performed by Delorse Lek, MD.   Electronically signed 05/19/2018 2:04:46PM The electronic signature indicates that the named Attending Pathologist has evaluated the specimen  Technical component performed at [facility], [address] [place], [place] [address] Lab: [phone] Dir: Rush Farmer, MD, MMM  Professional component performed at [facility], [facility], [place], [place], [place] [address] Lab: [phone] Dir: Dellia Nims. Reuel Derby, MD       Assessment & Plan:   Problem List Items Addressed This Visit      Respiratory   Primary cancer of right lower lobe of lung ([place]) - Primary    S/P wedge resection. No need for chemo or radiation- they are just monitoring. Continues with pain 6 weeks following surgery. Surgery is not going to refill pain medicine at this time. Pain remains uncontrolled. Will get her into see pain management- referral generated today. She states that her pecocet 7.5mg  wears off too quickly and she is not feeling good with it. We will change from percocet to xtampa 9mg  BID and recheck 1 month. Call with any concerns. Will increase gabapentin to 300mg  TID and will write for lidocaine patches. Call with any concerns.       Relevant Orders   Ambulatory referral to Pain Clinic     Musculoskeletal and Integument   DDD (degenerative disc disease),  lumbosacral    Referral to pain management made today. Await appointment. See discussion under lung cancer.      Relevant Medications   oxyCODONE ER (XTAMPZA ER) 9 MG C12A   Other Relevant Orders   Ambulatory referral to Pain Clinic   DDD (degenerative disc disease), lumbar    Referral to pain management made today. Await appointment. See discussion under lung cancer.      Relevant Medications   oxyCODONE ER (XTAMPZA ER) 9 MG  C12A   Other Relevant Orders   Ambulatory referral to Pain Clinic   Facet syndrome, lumbar    Referral to pain management made today. Await appointment. See discussion under lung cancer.      Relevant Medications   oxyCODONE ER (XTAMPZA ER) 9 MG C12A   Other Relevant Orders   Ambulatory referral to Pain Clinic     Other   Chronic pain    Referral to pain management made today. Await appointment. See discussion under lung cancer.      Relevant Medications   gabapentin (NEURONTIN) 300 MG capsule   oxyCODONE ER (XTAMPZA ER) 9 MG C12A   Other Relevant Orders   Ambulatory referral to Pain Clinic   Controlled substance agreement signed    For pain medicine- currently starting xtampa 9mg  BID- referral to pain management pending. See scanned document          Follow up plan: Return in about 4 weeks (around 07/26/2018) for Follow up pain.

## 2018-06-28 NOTE — Telephone Encounter (Signed)
Copied from [place]. Topic: Quick Communication - See Telephone Encounter >> Jun 28, 2018  4:09 PM Vernona Rieger wrote: CRM for notification. See Telephone encounter for: 06/28/18.  Pharmacy called and stated that they do no have the oxyCODONE ER (XTAMPZA ER) 9 MG C12A in stock but the [facility] on [address] in [place] does. Please advise

## 2018-06-28 NOTE — Assessment & Plan Note (Signed)
Referral to pain management made today. Await appointment. See discussion under lung cancer.

## 2018-06-28 NOTE — Telephone Encounter (Signed)
Rx sent to [facility]. [facility].

## 2018-06-28 NOTE — Telephone Encounter (Signed)
Please let her know below.

## 2018-06-28 NOTE — Assessment & Plan Note (Addendum)
For pain medicine- currently starting xtampa 9mg  BID- referral to pain management pending. See scanned document

## 2018-06-28 NOTE — Assessment & Plan Note (Signed)
S/P wedge resection. No need for chemo or radiation- they are just monitoring. Continues with pain 6 weeks following surgery. Surgery is not going to refill pain medicine at this time. Pain remains uncontrolled. Will get her into see pain management- referral generated today. She states that her pecocet 7.5mg  wears off too quickly and she is not feeling good with it. We will change from percocet to xtampa 9mg  BID and recheck 1 month. Call with any concerns. Will increase gabapentin to 300mg  TID and will write for lidocaine patches. Call with any concerns.

## 2018-06-29 ENCOUNTER — Telehealth: Payer: Self-pay | Admitting: Family Medicine

## 2018-06-29 NOTE — Telephone Encounter (Signed)
Copied from [place] [id number]. Topic: Quick Communication - See Telephone Encounter >> Jun 29, 2018  8:03 AM Synthia Innocent wrote: CRM for notification. See Telephone encounter for: 06/29/18. Checking PA on lidocaine (LIDODERM) 5 %, requesting status update. CB # [phone]

## 2018-06-29 NOTE — Telephone Encounter (Signed)
PA submitted via cover my meds, waiting approval or denial.

## 2018-06-30 ENCOUNTER — Telehealth: Payer: Self-pay | Admitting: *Deleted

## 2018-06-30 NOTE — Telephone Encounter (Signed)
ONO pt needs 1 liter 02 qhs. Left message to call back.

## 2018-07-01 ENCOUNTER — Encounter: Payer: Self-pay | Admitting: Internal Medicine

## 2018-07-01 ENCOUNTER — Ambulatory Visit: Payer: Medicare Other | Admitting: Internal Medicine

## 2018-07-01 ENCOUNTER — Other Ambulatory Visit: Payer: Self-pay

## 2018-07-01 ENCOUNTER — Telehealth: Payer: Self-pay | Admitting: Family Medicine

## 2018-07-01 VITALS — BP 122/82 | HR 63 | Ht 62.0 in | Wt 198.0 lb

## 2018-07-01 DIAGNOSIS — J449 Chronic obstructive pulmonary disease, unspecified: Secondary | ICD-10-CM

## 2018-07-01 DIAGNOSIS — J90 Pleural effusion, not elsewhere classified: Secondary | ICD-10-CM

## 2018-07-01 MED ORDER — ALBUTEROL SULFATE HFA 108 (90 BASE) MCG/ACT IN AERS: 2.0000 | INHALATION_SPRAY | RESPIRATORY_TRACT | 2 refills | Status: AC | PRN

## 2018-07-01 NOTE — Telephone Encounter (Signed)
There is not. She can try the OTC patches and take the medicine that has already been prescribed, but there is nothing else we can do right now.

## 2018-07-01 NOTE — Telephone Encounter (Signed)
Copied from [place] [id number]. Topic: Quick Communication - Rx Refill/Question >> Jul 01, 2018  1:06 PM Susan Houston wrote: Medication: lidocaine patches  Pt called to speak w/ the nurse/pcp about the patches above; pt is having trouble w/ her insurance to get them; pt is wondering if there is another way for her to get them at no cost and/or getting an alternat patch; contact to advise

## 2018-07-01 NOTE — Telephone Encounter (Signed)
Dr.Johnson, is there anything else that can be given to the patient for the pain at the incision site?

## 2018-07-01 NOTE — Telephone Encounter (Signed)
Patient notified

## 2018-07-01 NOTE — Telephone Encounter (Signed)
Patient given results at today office visit with DK

## 2018-07-01 NOTE — Telephone Encounter (Signed)
Patient notified that the patches were denied.

## 2018-07-01 NOTE — Progress Notes (Signed)
Name: Susan Houston MRN: [id number] DOB: [birth year]    STUDIES:     CXR independently reviewed by Me    7.9.19 CT chest Independently reviewed by Me RLL lung nodule seen  PFT 04/2018 Ratio 63% FEV1 76% FEF 25/75 41% Mild to moderate obstructive lung disease  Synopsis  57 yo Patient has initially had hematuria and found to have incidental finding of RLL nodule then follow up CT scan confirmed nodule on July 29 where she underwent a right thoracotomy and wedge resection of a right lower lobe mass.  The final pathology revealed a squamous cell carcinoma.  She had previously had a head neck cancer treated with radiation therapy and chemotherapy several years ago.    hospital Course: She was admitted to the intensive care unit postoperatively where she was nursed overnight.  Her chest tube was removed on postoperative day #3.  She had some problems with hypoxia and she was treated with oxygen therapy and was discharged home on 2 L of oxygen therapy.   Patient followed up with pulmonary to assess for hypoxia and manage her COPD   CC follow-up COPD and hypoxia  HISTORY OF PRESENT ILLNESS: Patient here for follow-up COPD and hypoxia At this time her symptoms seem to be stable at this time  Patient did have ONO that was positive for nocturnal hypoxia and has been on oxygen therapy which is helping her and benefiting her  A 6-minute walk test is needed to assess exertional hypoxia  Patient states that her shortness of breath is stable She used to be on inhalers but is not on any inhalers right now I recommend that she uses albuterol inhaler as needed  We will hold off on prescribing any type of other inhalers at this time  No signs of infection at this time No signs of COPD exacerbation at this time  PAST MEDICAL HISTORY :   has a past medical history of Anemia, Anxiety, Arthritis, Asthma, Back pain, Bruises easily, Cancer ([place]) (2009), Chronic leg pain, COPD (chronic obstructive  pulmonary disease) ([place]), Depression, Difficulty sleeping, Diverticulitis, Dysrhythmia (04/2018), Facet syndrome, lumbar (03/28/2015), Hemorrhoids, History of kidney stones (04/2018), Hyperlipidemia, Hypertension (2019), Incontinence of urine, Laryngeal cancer ([place]) (2009), Lung cancer ([place]) (04/2018), Migraine, Migraine without aura and without status migrainosus, not intractable (09/11/2016), Mild cognitive impairment (11/23/2017), MRSA infection greater than 3 months ago, Nerve damage, Nodule of lower lobe of right lung (04/22/2018), and Wears dentures.  has a past surgical history that includes Bladder suspension (2014); Tonsillectomy; Excision neuroma (Bilateral, 09/24/2014); Esophagogastroduodenoscopy (2015); Colonoscopy; Esophagogastroduodenoscopy (egd) with propofol (N/A, 11/20/2016); Joint replacement (2009/2010); Abdominal hysterectomy (1983); Eye surgery (Left, 05/11/2018); Cholecystectomy (1998); Cataract extraction w/PHACO (Left, 05/11/2018); Thoracotomy/lobectomy (Right, 05/16/2018); and Video bronchoscopy (N/A, 05/16/2018). Prior to Admission medications   Medication Sig Start Date End Date Taking? Authorizing Provider  aspirin 81 MG tablet Take 81 mg by mouth daily.    [provider]  BREO ELLIPTA 100-25 MCG/INH AEPB INHALE 1 PUFF PO QD 05/20/18   [provider]  gabapentin (NEURONTIN) 100 MG capsule Take 1 capsule (100 mg total) by mouth 3 (three) times daily. 05/23/18   Nestor Lewandowsky, MD  gabapentin (NEURONTIN) 100 MG capsule Take 1 capsule (100 mg total) by mouth 3 (three) times daily. 05/31/18   Nestor Lewandowsky, MD  magnesium hydroxide (MILK OF MAGNESIA) 400 MG/5ML suspension Take 5 mLs by mouth daily as needed for mild constipation or moderate constipation.    [provider]  methocarbamol (ROBAXIN) 500  MG tablet Take 500 mg by mouth every 6 (six) hours as needed.  05/20/18   [provider]  oxyCODONE-acetaminophen (PERCOCET) 7.5-325 MG tablet Take 1 tablet by mouth  every 4 (four) hours as needed for severe pain. 05/31/18   Nestor Lewandowsky, MD  Prednisolon-Gatiflox-Bromfenac 1-0.5-0.075 % SUSP Apply 1 drop to eye 2 (two) times daily. 05/20/18   Nestor Lewandowsky, MD  SPIRIVA HANDIHALER 18 MCG inhalation capsule INL CONTENTS OF 1 C ONCE DAILY USING HANDIHALER 05/20/18   [provider]  tiZANidine (ZANAFLEX) 4 MG capsule Take 2 mg by mouth at bedtime.     [provider]  traMADol (ULTRAM) 50 MG tablet TK 1-2 TS PO Q 6 H 05/20/18   [provider]  traMADol (ULTRAM) 50 MG tablet Take 1 tablet (50 mg total) by mouth 4 (four) times daily. 05/31/18 05/31/19  Nestor Lewandowsky, MD  traZODone (DESYREL) 50 MG tablet Take 25 mg by mouth at bedtime.     [provider]   Allergies  Allergen Reactions  . Morphine And Related Itching  . Adhesive [Tape] Other (See Comments)    Plastic tape rips skin and bruises her. PLEASE USE PAPER TAPE  . Lexapro [Escitalopram Oxalate] Other (See Comments)    Keeps awake for days.     Review of Systems: Gen:  Denies  fever, sweats, chills HEENT: Denies blurred vision, double vision. bleeds, sore throat Cvc:  No dizziness, chest pain. Resp:   Denies cough or sputum production, shortness of breath Gi: Denies swallowing difficulty, stomach pain. Gu:  Denies bladder incontinence, burning urine Ext:   No Joint pain, stiffness. Skin: No skin rash,  hives  Endoc:  No polyuria, polydipsia. Psych: No depression, insomnia. Other:  All other systems were reviewed with the patient and were negative other that what is mentioned in the HPI.        General Appearance: No distress  Neuro:without focal findings,  speech normal,  HEENT: PERRLA, EOM intact.   Pulmonary: normal breath sounds, No wheezing. CTA B/L CardiovascularNormal S1,S2.  No m/r/g.   Abdomen: Benign, Soft, non-tender. Renal:  No costovertebral tenderness  GU:  No performed at this time. Endoc: No evident thyromegaly, no signs of  acromegaly. Skin:   warm, no rashes, no ecchymosis  Extremities: normal, no cyanosis, clubbing. PSYCH-no depression, no anxiety     BP 122/82 (BP Location: Right Arm, Cuff Size: Normal)   Pulse 63   Ht 5\' 2"  (1.575 m)   Wt 198 lb (89.8 kg)   SpO2 100%   BMI 36.21 kg/m      ASSESSMENT / PLAN: 57 year old pleasant white female seen today for postop resection of right lower lobe lung nodule consistent with squamous cell carcinoma in the setting of mild to moderate COPD with postop hypoxic respiratory failure Patient remains on oxygen therapy at this time  Her overnight pulse oximetry was positive for hypoxia and she will need oxygen therapy at nighttime She will need a 6-minute walk test to assess her exertional hypoxia At this time her COPD seems to be in the control and may need only albuterol as needed  #1 shortness of breath-multifactorial etiology Likely related to morbid obesity deconditioned state and underlying COPD  #2 mild to moderate COPD Gold stage B At this time she will hold off on her inhaler therapy and will only use albuterol as needed I have advised to call us if her symptoms get worse so we may prescribe long-term inhaler therapy  #3  chronic hypoxic respiratory failure We will need to assess for 6-minute walk test to assess for exertional hypoxia Patient using benefits oxygen and nighttime for nocturnal hypoxia  #4 obesity -recommend significant weight loss -recommend changing diet  #5 deconditioned state -Recommend increased daily activity and exercise    Patient/Family are satisfied with Plan of action and management. All questions answered  Corrin Parker, M.D.  [facility]  Medical Director [place] Director [facility] Cardio-Pulmonary Department

## 2018-07-01 NOTE — Patient Instructions (Addendum)
Check 6WMT  Recommend ALBUTEROL 2-4 puffs every 4 hrs as needed and prior to exercise  CONTINUE OXYGEN THERAPY AT NIGHT

## 2018-07-04 ENCOUNTER — Encounter: Payer: Self-pay | Admitting: Cardiothoracic Surgery

## 2018-07-04 ENCOUNTER — Other Ambulatory Visit: Payer: Self-pay

## 2018-07-04 ENCOUNTER — Ambulatory Visit (INDEPENDENT_AMBULATORY_CARE_PROVIDER_SITE_OTHER): Payer: Medicare Other | Admitting: Cardiothoracic Surgery

## 2018-07-04 VITALS — BP 120/76 | HR 65 | Temp 97.9°F | Resp 12 | Ht 62.0 in | Wt 201.2 lb

## 2018-07-04 DIAGNOSIS — Z483 Aftercare following surgery for neoplasm: Secondary | ICD-10-CM | POA: Diagnosis not present

## 2018-07-04 DIAGNOSIS — J449 Chronic obstructive pulmonary disease, unspecified: Secondary | ICD-10-CM | POA: Diagnosis not present

## 2018-07-04 DIAGNOSIS — J45909 Unspecified asthma, uncomplicated: Secondary | ICD-10-CM | POA: Diagnosis not present

## 2018-07-04 DIAGNOSIS — Z96653 Presence of artificial knee joint, bilateral: Secondary | ICD-10-CM | POA: Diagnosis not present

## 2018-07-04 DIAGNOSIS — I10 Essential (primary) hypertension: Secondary | ICD-10-CM | POA: Diagnosis not present

## 2018-07-04 DIAGNOSIS — Z87891 Personal history of nicotine dependence: Secondary | ICD-10-CM | POA: Diagnosis not present

## 2018-07-04 DIAGNOSIS — Z9981 Dependence on supplemental oxygen: Secondary | ICD-10-CM | POA: Diagnosis not present

## 2018-07-04 DIAGNOSIS — Z7982 Long term (current) use of aspirin: Secondary | ICD-10-CM | POA: Diagnosis not present

## 2018-07-04 DIAGNOSIS — C3431 Malignant neoplasm of lower lobe, right bronchus or lung: Secondary | ICD-10-CM | POA: Diagnosis not present

## 2018-07-04 DIAGNOSIS — Z8521 Personal history of malignant neoplasm of larynx: Secondary | ICD-10-CM | POA: Diagnosis not present

## 2018-07-04 DIAGNOSIS — Z902 Acquired absence of lung [part of]: Secondary | ICD-10-CM | POA: Diagnosis not present

## 2018-07-04 DIAGNOSIS — Z791 Long term (current) use of non-steroidal anti-inflammatories (NSAID): Secondary | ICD-10-CM | POA: Diagnosis not present

## 2018-07-04 DIAGNOSIS — M79605 Pain in left leg: Secondary | ICD-10-CM | POA: Diagnosis not present

## 2018-07-04 DIAGNOSIS — Z7951 Long term (current) use of inhaled steroids: Secondary | ICD-10-CM | POA: Diagnosis not present

## 2018-07-04 DIAGNOSIS — M79604 Pain in right leg: Secondary | ICD-10-CM | POA: Diagnosis not present

## 2018-07-04 DIAGNOSIS — R918 Other nonspecific abnormal finding of lung field: Secondary | ICD-10-CM

## 2018-07-04 DIAGNOSIS — G8929 Other chronic pain: Secondary | ICD-10-CM | POA: Diagnosis not present

## 2018-07-04 NOTE — Progress Notes (Signed)
  She returns today in follow-up.  She is now about 6 or 7 weeks from her right thoracotomy and wedge resection.  She states that she has been getting along very well.  She has only minimal pain.  Is not clear exactly why her pain has been so markedly improved as she is taking less pain medication now than she was before but we are pleased that she has had such a good result.  She is still waiting for approval for the lidocaine patches.  She has used her oxygen and a 24-hour test has confirmed the need for her oxygen at night.  She is scheduled for 6-minute walk test later this week.  She did not have an x-ray today but is scheduled to have a CT scan by Dr. Lynett Fish in November.  Today her lungs are clear bilaterally.  Her heart is regular.  Her thoracotomy wound is well-healed.  There is no adenopathy in her neck or supraclavicular space.  I am pleased that she seems to be making such good progress.  She has follow-up with pulmonary medicine and oncology and therefore we did not make a return visit for today.

## 2018-07-06 ENCOUNTER — Ambulatory Visit (INDEPENDENT_AMBULATORY_CARE_PROVIDER_SITE_OTHER): Payer: Medicare Other | Admitting: *Deleted

## 2018-07-06 DIAGNOSIS — J449 Chronic obstructive pulmonary disease, unspecified: Secondary | ICD-10-CM

## 2018-07-06 NOTE — Progress Notes (Signed)
SIX MIN WALK 07/06/2018  Medications none  Supplimental Oxygen during Test? (L/min) No  Laps 5  Partial Lap (in Meters) 39  Baseline BP (sitting) 128/78  Baseline Heartrate 61  Baseline Dyspnea (Borg Scale) 2  Baseline Fatigue (Borg Scale) 1  Baseline SPO2 97  BP (sitting) 142/90  Heartrate 88  Dyspnea (Borg Scale) 5  Fatigue (Borg Scale) 5  SPO2 96  BP (sitting) 122/82  Heartrate 65  SPO2 100  Stopped or Paused before Six Minutes No  Distance Completed 279  Tech Comments: pt got headache at end of walk

## 2018-07-13 ENCOUNTER — Encounter: Payer: Self-pay | Admitting: Family Medicine

## 2018-07-13 ENCOUNTER — Ambulatory Visit: Payer: Medicare Other | Admitting: Family Medicine

## 2018-07-13 ENCOUNTER — Other Ambulatory Visit: Payer: Self-pay

## 2018-07-13 ENCOUNTER — Encounter

## 2018-07-13 VITALS — BP 142/81 | HR 71 | Temp 98.7°F | Ht 62.0 in

## 2018-07-13 DIAGNOSIS — C3431 Malignant neoplasm of lower lobe, right bronchus or lung: Secondary | ICD-10-CM | POA: Diagnosis not present

## 2018-07-13 MED ORDER — GABAPENTIN 300 MG PO CAPS: 300.0000 mg | ORAL_CAPSULE | Freq: Three times a day (TID) | ORAL | 1 refills | Status: DC

## 2018-07-13 MED ORDER — OXYCODONE ER 9 MG PO C12A: 9.0000 mg | EXTENDED_RELEASE_CAPSULE | Freq: Two times a day (BID) | ORAL | 0 refills | Status: DC

## 2018-07-13 NOTE — Progress Notes (Signed)
BP (!) 142/81   Pulse 71   Temp 98.7 F (37.1 C) (Oral)   Ht 5\' 2"  (1.575 m)   SpO2 96%   BMI 36.80 kg/m    Subjective:    Patient ID: Susan Houston, female    DOB: [birth year], 57 y.o.   MRN: [id number]  HPI: Susan Houston is a 57 y.o. female  Chief Complaint  Patient presents with  . Pain    pt states pain medication f/u   CHRONIC PAIN  Present dose: 27 Morphine equivalents Pain control status: better Duration: 2 months Location:  middle of her back into her breast on the R side at surgical site Quality: aching and sore Current Pain Level: 4/10 Previous Pain Level: 7/10 Breakthrough pain: yes Benefit from narcotic medications: yes What Activities task can be accomplished with current medication? Able to function and do her ADLs Interested in weaning off narcotics:no   Stool softners/OTC fiber: yes  Previous pain specialty evaluation: yes- Dr. Primus Bravo 2017 Non-narcotic analgesic meds: yes- gabapentin 300mg  TID Narcotic contract: yes  Relevant past medical, surgical, family and social history reviewed and updated as indicated. Interim medical history since our last visit reviewed. Allergies and medications reviewed and updated.  Review of Systems  Constitutional: Negative.   Respiratory: Negative.   Cardiovascular: Negative.   Musculoskeletal: Positive for back pain and myalgias. Negative for arthralgias, gait problem, joint swelling, neck pain and neck stiffness.  Psychiatric/Behavioral: Negative.     Per HPI unless specifically indicated above     Objective:    BP (!) 142/81   Pulse 71   Temp 98.7 F (37.1 C) (Oral)   Ht 5\' 2"  (1.575 m)   SpO2 96%   BMI 36.80 kg/m   Wt Readings from Last 3 Encounters:  07/04/18 201 lb 3.2 oz (91.3 kg)  07/01/18 198 lb (89.8 kg)  06/28/18 197 lb (89.4 kg)    Physical Exam  Constitutional: She is oriented to person, place, and time. She appears well-developed and well-nourished. No distress.  HENT:  Head: Normocephalic  and atraumatic.  Right Ear: Hearing normal.  Left Ear: Hearing normal.  Nose: Nose normal.  Eyes: Conjunctivae and lids are normal. Right eye exhibits no discharge. Left eye exhibits no discharge. No scleral icterus.  Cardiovascular: Normal rate, regular rhythm, normal heart sounds and intact distal pulses. Exam reveals no gallop and no friction rub.  No murmur heard. Pulmonary/Chest: Effort normal and breath sounds normal. No stridor. No respiratory distress. She has no wheezes. She has no rales. She exhibits no tenderness.  Musculoskeletal: Normal range of motion.  Neurological: She is alert and oriented to person, place, and time.  Skin: Skin is warm, dry and intact. Capillary refill takes less than 2 seconds. No rash noted. She is not diaphoretic. No erythema. No pallor.  Psychiatric: She has a normal mood and affect. Her speech is normal and behavior is normal. Judgment and thought content normal. Cognition and memory are normal.  Nursing note and vitals reviewed.   Results for orders placed or performed during the hospital encounter of 05/16/18  Glucose, capillary  Result Value Ref Range   Glucose-Capillary 101 (H) 70 - 99 mg/dL  Glucose, capillary  Result Value Ref Range   Glucose-Capillary 107 (H) 70 - 99 mg/dL  CBC  Result Value Ref Range   WBC 8.6 3.6 - 11.0 K/uL   RBC 3.76 (L) 3.80 - 5.20 MIL/uL   Hemoglobin 11.3 (L) 12.0 - 16.0 g/dL  HCT 33.7 (L) 35.0 - 47.0 %   MCV 89.6 80.0 - 100.0 fL   MCH 30.1 26.0 - 34.0 pg   MCHC 33.6 32.0 - 36.0 g/dL   RDW 14.4 11.5 - 14.5 %   Platelets 229 150 - 440 K/uL  Basic metabolic panel  Result Value Ref Range   Sodium 137 135 - 145 mmol/L   Potassium 4.7 3.5 - 5.1 mmol/L   Chloride 101 98 - 111 mmol/L   CO2 29 22 - 32 mmol/L   Glucose, Bld 103 (H) 70 - 99 mg/dL   BUN 9 6 - 20 mg/dL   Creatinine, Ser 0.60 0.44 - 1.00 mg/dL   Calcium 8.7 (L) 8.9 - 10.3 mg/dL   GFR calc non Af Amer >60 >60 mL/min   GFR calc Af Amer >60 >60 mL/min     Anion gap 7 5 - 15  ABO/Rh  Result Value Ref Range   ABO/RH(D)      B NEG Performed at [facility], [address] [place], [place] [address]   Surgical pathology  Result Value Ref Range   SURGICAL PATHOLOGY      Surgical Pathology CASE: [id number] PATIENT: Susan Houston Surgical Pathology Report     SPECIMEN SUBMITTED: A. Lobe, right lower  CLINICAL HISTORY: None provided  PRE-OPERATIVE DIAGNOSIS: Mass right lower lobe  POST-OPERATIVE DIAGNOSIS: Same as pre-op     DIAGNOSIS: A.  RIGHT LUNG, LOWER LOBE; WEDGE RESECTION: - INVASIVE SQUAMOUS CELL CARCINOMA. - SEE SUMMARY BELOW.  CANCER CASE SUMMARY: LUNG Procedure: Wedge resection Specimen Laterality: Right Tumor Site: Lower lobe Tumor Size: 1.0 cm Invasive Tumor Size: 1.0 cm Tumor Focality: Single tumor Histologic Type: Invasive squamous cell carcinoma Visceral Pleura Invasion: Not identified Lymphovascular Invasion: Present Direct Invasion of Adjacent Structures: No adjacent structures present Margins: All margins uninvolved by tumor, closest margin, parenchymal (7 mm) Treatment Effect: No known presurgical therapy Regional Lymph nodes: No lymph nodes submitted or found Pathologic Stage Classification ( pTNM, AJCC 8th Edition): pT1 pNX pM not applicable TNM Descriptors: Not applicable  Note: Elastin stain is intact and uninvolved by tumor.  Super pancytokeratin and p40 are immunoreactive while TTF-1 is negative. IHC slides were prepared by [facility], [place] and interpreted by Dr. Luana Shu. All controls stained appropriately.  This test was developed and its performance characteristics determined by [facility]. It has not been cleared or approved by the [place] Food and Drug Administration. The FDA does not require this test to go through premarket FDA review. This test is used for clinical purposes. It should not be regarded as investigational or for research. This laboratory is certified  under the Clinical Laboratory Improvement Amendments (CLIA) as qualified to perform high complexity clinical laboratory testing.    GROSS DESCRIPTION: A. intraoperative Consultation:     Labeled: Right lower lobe     Received: Fresh and placed in formalin     Specimen: Right lower lo be     Pathologic evaluation performed: Frozen section     Diagnosis: Lung, right lower lobe; wedge resection: Non-small cell carcinoma, favor squamous cell carcinoma, 1 cm from closest staple     Communicated to: Dr. Faith Rogue at 8:54 AM on 05/16/2018, Delorse Lek, MD     Tissue submitted: A1 A. Labeled: Right lower lobe Received: Fresh and placed in formalin Type of procedure: Wedge Laterality: Right Weight of specimen: 7 grams Size of specimen: 6.2 x 4.1 x 0.8 cm Other attached structures: 6.2 cm long staple line Specimen integrity:  Grossly intact Orientation: Pleural-blue and parenchymal margin-orange Number of masses: 1 Size(s) of mass(es): 1.8 x 1.5 x 0.7 cm Location of mass(es): Subpleural Description of mass(es): Ill-defined firm tan and abutting pleura Relationship of mass(es) to bronchus: Cannot be determined Margins: Abutting pleura Parenchymal margin: 1.0 cm from staple line Other margins: Abutting pleura Relationship / distance of mass(es) to pleura: Abutt ing description of remainder of lung: crepitance red-tan  Block summary: 1 - frozen section remnant 2-4 - additional representative sections with parenchymal and pleural margins     Final Diagnosis performed by Delorse Lek, MD.   Electronically signed 05/19/2018 2:04:46PM The electronic signature indicates that the named Attending Pathologist has evaluated the specimen  Technical component performed at [facility], [address] [place], [place] [address] Lab: [phone] Dir: Rush Farmer, MD, MMM  Professional component performed at [facility], [facility], [place], [place], [place] [address] Lab:  [phone] Dir: Dellia Nims. Reuel Derby, MD       Assessment & Plan:   Problem List Items Addressed This Visit      Respiratory   Primary cancer of right lower lobe of lung ([place]) - Primary    Pain greatly improved. Feeling much better. Will keep her on current regimen and recheck in 6 weeks. Call with any concerns.           Follow up plan: Return 5-6 weeks, for follow up pain.

## 2018-07-13 NOTE — Assessment & Plan Note (Signed)
Pain greatly improved. Feeling much better. Will keep her on current regimen and recheck in 6 weeks. Call with any concerns.

## 2018-07-20 ENCOUNTER — Encounter: Payer: Self-pay | Admitting: Oncology

## 2018-07-20 ENCOUNTER — Ambulatory Visit
Admission: RE | Admit: 2018-07-20 | Discharge: 2018-07-20 | Disposition: A | Discharge status: Home / Self Care | Payer: Medicare HMO | Source: Ambulatory Visit | Attending: Oncology | Admitting: Oncology

## 2018-07-20 ENCOUNTER — Other Ambulatory Visit: Payer: Self-pay | Admitting: Oncology

## 2018-07-20 ENCOUNTER — Inpatient Hospital Stay: Payer: Medicare HMO

## 2018-07-20 ENCOUNTER — Telehealth: Payer: Self-pay | Admitting: *Deleted

## 2018-07-20 ENCOUNTER — Inpatient Hospital Stay (HOSPITAL_BASED_OUTPATIENT_CLINIC_OR_DEPARTMENT_OTHER): Payer: Medicare HMO | Admitting: Oncology

## 2018-07-20 ENCOUNTER — Inpatient Hospital Stay: Payer: Medicare HMO | Attending: Internal Medicine

## 2018-07-20 ENCOUNTER — Other Ambulatory Visit: Payer: Self-pay

## 2018-07-20 VITALS — BP 119/77 | HR 71 | Temp 98.2°F | Resp 18 | Wt 198.0 lb

## 2018-07-20 DIAGNOSIS — Z902 Acquired absence of lung [part of]: Secondary | ICD-10-CM | POA: Diagnosis not present

## 2018-07-20 DIAGNOSIS — Z85118 Personal history of other malignant neoplasm of bronchus and lung: Secondary | ICD-10-CM | POA: Diagnosis not present

## 2018-07-20 DIAGNOSIS — J069 Acute upper respiratory infection, unspecified: Secondary | ICD-10-CM

## 2018-07-20 DIAGNOSIS — R0602 Shortness of breath: Secondary | ICD-10-CM

## 2018-07-20 DIAGNOSIS — Z8521 Personal history of malignant neoplasm of larynx: Secondary | ICD-10-CM | POA: Diagnosis not present

## 2018-07-20 DIAGNOSIS — Z923 Personal history of irradiation: Secondary | ICD-10-CM | POA: Diagnosis not present

## 2018-07-20 DIAGNOSIS — R059 Cough, unspecified: Secondary | ICD-10-CM

## 2018-07-20 DIAGNOSIS — R062 Wheezing: Secondary | ICD-10-CM

## 2018-07-20 DIAGNOSIS — Z87891 Personal history of nicotine dependence: Secondary | ICD-10-CM | POA: Diagnosis not present

## 2018-07-20 DIAGNOSIS — R05 Cough: Secondary | ICD-10-CM

## 2018-07-20 DIAGNOSIS — R509 Fever, unspecified: Secondary | ICD-10-CM

## 2018-07-20 DIAGNOSIS — J441 Chronic obstructive pulmonary disease with (acute) exacerbation: Secondary | ICD-10-CM

## 2018-07-20 DIAGNOSIS — R531 Weakness: Secondary | ICD-10-CM

## 2018-07-20 DIAGNOSIS — C3431 Malignant neoplasm of lower lobe, right bronchus or lung: Secondary | ICD-10-CM

## 2018-07-20 LAB — COMPREHENSIVE METABOLIC PANEL
ALT: 11 U/L (ref 0–44)
AST: 16 U/L (ref 15–41)
Albumin: 3.6 g/dL (ref 3.5–5.0)
Alkaline Phosphatase: 87 U/L (ref 38–126)
Anion gap: 8 (ref 5–15)
BUN: 16 mg/dL (ref 6–20)
CALCIUM: 8.9 mg/dL (ref 8.9–10.3)
CO2: 24 mmol/L (ref 22–32)
CREATININE: 0.81 mg/dL (ref 0.44–1.00)
Chloride: 105 mmol/L (ref 98–111)
Glucose, Bld: 101 mg/dL — ABNORMAL HIGH (ref 70–99)
Potassium: 4.5 mmol/L (ref 3.5–5.1)
Sodium: 137 mmol/L (ref 135–145)
Total Bilirubin: 0.5 mg/dL (ref 0.3–1.2)
Total Protein: 6.9 g/dL (ref 6.5–8.1)

## 2018-07-20 LAB — CBC WITH DIFFERENTIAL/PLATELET
BASOS ABS: 0.1 10*3/uL (ref 0–0.1)
BASOS PCT: 1 %
Eosinophils Absolute: 0.2 10*3/uL (ref 0–0.7)
Eosinophils Relative: 3 %
HCT: 31.8 % — ABNORMAL LOW (ref 35.0–47.0)
Hemoglobin: 10.7 g/dL — ABNORMAL LOW (ref 12.0–16.0)
LYMPHS PCT: 26 %
Lymphs Abs: 1.6 10*3/uL (ref 1.0–3.6)
MCH: 28.6 pg (ref 26.0–34.0)
MCHC: 33.7 g/dL (ref 32.0–36.0)
MCV: 84.8 fL (ref 80.0–100.0)
Monocytes Absolute: 0.4 10*3/uL (ref 0.2–0.9)
Monocytes Relative: 6 %
NEUTROS ABS: 4.1 10*3/uL (ref 1.4–6.5)
NEUTROS PCT: 64 %
PLATELETS: 249 10*3/uL (ref 150–440)
RBC: 3.75 MIL/uL — AB (ref 3.80–5.20)
RDW: 13.5 % (ref 11.5–14.5)
WBC: 6.4 10*3/uL (ref 3.6–11.0)

## 2018-07-20 MED ORDER — IPRATROPIUM-ALBUTEROL 0.5-2.5 (3) MG/3ML IN SOLN
3.0000 mL | Freq: Four times a day (QID) | RESPIRATORY_TRACT | Status: DC
  Administered 2018-07-20: 3 mL via RESPIRATORY_TRACT
  Filled 2018-07-20: qty 3

## 2018-07-20 MED ORDER — IPRATROPIUM-ALBUTEROL 0.5-2.5 (3) MG/3ML IN SOLN: 3.0000 mL | Freq: Four times a day (QID) | RESPIRATORY_TRACT | Status: DC

## 2018-07-20 NOTE — Telephone Encounter (Signed)
Patient called asking to be seen in [place]. She reports that she has not felt well in several days, has a fever 100.5 last check, shortness of breath, weakness and generally does not feel well. Appointment accepted for this afternoon.

## 2018-07-21 MED ORDER — PREDNISONE 10 MG (21) PO TBPK: ORAL_TABLET | ORAL | 0 refills | Status: DC

## 2018-07-21 NOTE — Progress Notes (Signed)
Symptom Management Consult note [facility]  [phone] [phone]  Patient Care Team: Valerie Roys, DO as PCP - General (Family Medicine) Anabel Bene, MD as Referring Physician (Neurology) Telford Nab, RN as Registered Nurse   Name of the patient: Susan Houston  [id number]  23-Mar-1961   Date of visit: 07/20/2018  Diagnosis: 1. Wheezing - DG Chest 2 View; Future   Chief Complaint: cough, wheezing  Current Treatment: Not on active treatment. S/p wedge resection.  Oncology History: Patient was last seen by primary oncologist Dr. Rogue Bussing on 05/27/2018 after having recent wedge resection by Dr. Genevive Bi.  At that visit, she was still requiring oxygen PRN, complained of pain at site of surgery and  continued to have limited activity d/t difficulty breathing.  Given her lung cancer was a stage I, it was decided there was no need for adjuvant chemotherapy or radiation.  Dr. Rogue Bussing recommended surveillance imaging every 6 months for the first 2 years followed by imaging on an annual basis for 5.  Case was discussed at tumor conference who stated that given she had negative margins with wedge resection no further treatment was necessary.  Oncology History   # 2011-laryngeal cancer/SCC; Stage II; April-June 2011 s/p  CRT [Dr.Choski/[facility]]  # 2019- likely STAGE I 10 mm RLL nodule [incidental]; s/p wedge resection-July 2019-stage I squamous cell lung cancer.  Adjuvant therapy.      Laryngeal cancer [facility])    Primary cancer of right lower lobe of lung ([place])   05/27/2018 Initial Diagnosis    Primary cancer of right lower lobe of lung ([place])     Subjective Data:  Subjective:     Susan Houston is a 57 y.o. female for evaluation of wheezing and cough. The patient is currently having symptoms / an exacerbation. Current symptoms include acute dyspnea, cough productive of white and green sputum in moderate amounts and wheezing. Symptoms have  been present since 4 days ago and have been gradually worsening. She denies fever. Associated symptoms include chills, fatigue, shortness of breath, weakness and wheezing.  This episode appears to have been triggered by no identifiable factor. Treatments tried for the current exacerbation: albuterol inhaler and oxygen. The patient denies similar episodes. She uses 2 pillows at night or sleeps in a recliner. Patient currently is on oxygen at 2 L/min per nasal cannula. at night only. The patient is having constitutional symptoms, including chills, fatigue and malaise. The patient has not been hospitalized for this condition before. She quit smoking approximately 2 years ago. The patient is experiencing exercise intolerance (difficulty climbing 2 flights of stairs). S/p wedge resection approximately 05/16/18. Tolerated well.   Previous Report Reviewed: historical medical records, imaging reports: normal chest x-ray, lab reports, office notes, operative reports and x-ray reports   The following portions of the patient's history were reviewed and updated as appropriate: allergies, current medications, past family history, past medical history, past social history, past surgical history and problem list.  Review of Systems A comprehensive review of systems was negative except for: Constitutional: positive for chills, fatigue and malaise Ears, nose, mouth, throat, and face: positive for nasal congestion Respiratory: positive for cough, dyspnea on exertion, sputum and wheezing Musculoskeletal: positive for muscle weakness    Objective:    BP 119/77 (BP Location: Left Arm)   Pulse 71   Temp 98.2 F (36.8 C) (Oral)   Resp 18   Wt 198 lb (89.8 kg)   SpO2 98%   BMI  36.21 kg/m  General appearance: alert, fatigued, no distress, moderately obese and pale Lungs: wheezes bilaterally Heart: regular rate and rhythm, S1, S2 normal, no murmur, click, rub or gallop Abdomen: soft, non-tender; bowel sounds normal;  no masses,  no organomegaly  Diagnostic Review Chest x-ray: Negative. No acute cardiopulmonary disease. Pulse oximetry, exercise 92% Pulse oximetry, resting: 96% Uses oxygen at night 2L.    Assessment:    URI with stable lung symptoms wheezing. No active processidentifed via chest x-ray.      Plan:    Discussed diagnosis, its evaluation, treatment and usual course. All questions answered. Neurosurgeon distributed. Labs per orders. Chest x-ray per orders. Steroid burst per orders.    Stage I lung cancer: s/p wedge resection with Dr. Genevive Bi (05/16/18).  Appears to have tolerated well.  Requires 2 L oxygen at night.  Currently under surveillance with Dr. Rogue Bussing with imaging every 6 months.  Has follow-up with Dr. Rogue Bussing in November 2019.  COPD exacerbation/wheezing: No fevers.  Has been seen by Dr. Mortimer Fries for COPD on March 12, 202019.  She was referred due to hypoxia experienced after wedge resection.  A 6-minute walk test was conducted to assess exertional hypoxia.  Symptoms appeared to be mild to moderate.  She was prescribed an albuterol inhaler PRN.  Will get stat chest x-ray.  Chest x-ray: No active cardiopulmonary disease.  Given patient is significantly wheezy bilaterally, will try steroid burst with prednisone tablet pack.  Patient instructed on how to use.  She was also encouraged to use her albuterol inhaler every 4 hours as needed for wheezing.  Patient and husband in agreement with plan.   Greater than 50% was spent in counseling and coordination of care with this patient including but not limited to discussion of the relevant topics above (See A&P) including, but not limited to diagnosis and management of acute and chronic medical conditions.   Faythe Casa, NP 07/21/2018 9:46 AM

## 2018-07-22 DIAGNOSIS — H2512 Age-related nuclear cataract, left eye: Secondary | ICD-10-CM | POA: Diagnosis not present

## 2018-07-22 DIAGNOSIS — H2511 Age-related nuclear cataract, right eye: Secondary | ICD-10-CM | POA: Diagnosis not present

## 2018-07-23 DIAGNOSIS — C3490 Malignant neoplasm of unspecified part of unspecified bronchus or lung: Secondary | ICD-10-CM | POA: Diagnosis not present

## 2018-07-23 DIAGNOSIS — Z96659 Presence of unspecified artificial knee joint: Secondary | ICD-10-CM | POA: Diagnosis not present

## 2018-07-25 ENCOUNTER — Ambulatory Visit: Payer: Medicare Other | Admitting: Family Medicine

## 2018-07-28 ENCOUNTER — Other Ambulatory Visit: Payer: Self-pay | Admitting: Family Medicine

## 2018-07-28 ENCOUNTER — Ambulatory Visit: Payer: Medicare Other | Admitting: Nurse Practitioner

## 2018-07-28 ENCOUNTER — Telehealth: Payer: Self-pay

## 2018-07-28 DIAGNOSIS — C3490 Malignant neoplasm of unspecified part of unspecified bronchus or lung: Secondary | ICD-10-CM | POA: Diagnosis not present

## 2018-07-28 DIAGNOSIS — Z96659 Presence of unspecified artificial knee joint: Secondary | ICD-10-CM | POA: Diagnosis not present

## 2018-07-28 MED ORDER — OXYCODONE ER 9 MG PO C12A: 9.0000 mg | EXTENDED_RELEASE_CAPSULE | Freq: Two times a day (BID) | ORAL | 0 refills | Status: AC

## 2018-07-28 NOTE — Telephone Encounter (Signed)
Pharmacy confirmed that they received the medication.

## 2018-07-28 NOTE — Telephone Encounter (Signed)
Patient called and stated oxyCODONE is not at pharmacy. Called pharmacy. They confirmed they had never received it. (Do have electronic confirmation) They stated they've only ever received this medication from a  [facility], please advise. Okay to call in?

## 2018-07-28 NOTE — Telephone Encounter (Signed)
Rx sent to her pharmacy again. We have sent this medicine in at least 2 other times-- so I don't know what the pharmacy is talking about.

## 2018-08-03 ENCOUNTER — Encounter: Payer: Self-pay | Admitting: *Deleted

## 2018-08-08 ENCOUNTER — Telehealth: Payer: Self-pay | Admitting: Family Medicine

## 2018-08-08 NOTE — Telephone Encounter (Signed)
Copied from [place] [phone]. Topic: Quick Communication - Rx Refill/Question >> Aug 08, 2018  3:20 PM Bea Graff, NT wrote: Medication: oxyCODONE ER [facility] ER) 9 MG C12A  Pt is out of medication.   Has the patient contacted their pharmacy? Yes.   (Agent: If no, request that the patient contact the pharmacy for the refill.) (Agent: If yes, when and what did the pharmacy advise?)  Preferred Pharmacy (with phone number or street name): [id number] [address], [place] - [address] [phone] (Phone) [phone] (Fax)    Agent: Please be advised that RX refills may take up to 3 business days. We ask that you follow-up with your pharmacy.

## 2018-08-08 NOTE — Telephone Encounter (Signed)
Spoke with patient, pharmacy only gave her 98 when it was filled, I informed patient to contact the pharmacy and discuss a refill with them since the original prescription was for 60 pills.

## 2018-08-08 NOTE — Telephone Encounter (Signed)
Patient was given refill on 07/28/18- she is not due.

## 2018-08-09 ENCOUNTER — Ambulatory Visit: Payer: Medicare HMO | Admitting: Nurse Practitioner

## 2018-08-09 ENCOUNTER — Telehealth: Payer: Self-pay | Admitting: Family Medicine

## 2018-08-09 NOTE — Telephone Encounter (Signed)
Put on Susan Houston's schedule for 10/23 @ 11:15

## 2018-08-09 NOTE — Telephone Encounter (Signed)
Needs to be seen

## 2018-08-09 NOTE — Telephone Encounter (Signed)
Copied from [place] [id number]. Topic: General - Other >> Aug 09, 2018  1:51 PM Carolyn Stare wrote:  Pt said she has a bad cough and is asking if Dr Wynetta Emery will call her in something    [place] >> Aug 09, 2018  1:57 PM Don Perking M wrote: Please advise.

## 2018-08-10 ENCOUNTER — Ambulatory Visit: Payer: Self-pay | Admitting: Family Medicine

## 2018-08-11 ENCOUNTER — Other Ambulatory Visit: Payer: Self-pay

## 2018-08-11 ENCOUNTER — Encounter: Payer: Self-pay | Admitting: Anesthesiology

## 2018-08-11 ENCOUNTER — Ambulatory Visit: Payer: Medicare HMO | Admitting: Anesthesiology

## 2018-08-11 ENCOUNTER — Ambulatory Visit
Admission: RE | Admit: 2018-08-11 | Discharge: 2018-08-11 | Disposition: A | Discharge status: Home / Self Care | Payer: Medicare HMO | Source: Ambulatory Visit | Attending: Ophthalmology | Admitting: Ophthalmology

## 2018-08-11 ENCOUNTER — Encounter
Admission: RE | Disposition: A | Discharge status: Home / Self Care | Payer: Self-pay | Source: Ambulatory Visit | Attending: Ophthalmology

## 2018-08-11 DIAGNOSIS — H2511 Age-related nuclear cataract, right eye: Secondary | ICD-10-CM | POA: Diagnosis not present

## 2018-08-11 DIAGNOSIS — E669 Obesity, unspecified: Secondary | ICD-10-CM | POA: Diagnosis not present

## 2018-08-11 DIAGNOSIS — Z8614 Personal history of Methicillin resistant Staphylococcus aureus infection: Secondary | ICD-10-CM | POA: Diagnosis not present

## 2018-08-11 DIAGNOSIS — Z87891 Personal history of nicotine dependence: Secondary | ICD-10-CM | POA: Insufficient documentation

## 2018-08-11 DIAGNOSIS — J449 Chronic obstructive pulmonary disease, unspecified: Secondary | ICD-10-CM | POA: Insufficient documentation

## 2018-08-11 DIAGNOSIS — Z8521 Personal history of malignant neoplasm of larynx: Secondary | ICD-10-CM | POA: Insufficient documentation

## 2018-08-11 DIAGNOSIS — Z6836 Body mass index (BMI) 36.0-36.9, adult: Secondary | ICD-10-CM | POA: Diagnosis not present

## 2018-08-11 DIAGNOSIS — F329 Major depressive disorder, single episode, unspecified: Secondary | ICD-10-CM | POA: Insufficient documentation

## 2018-08-11 DIAGNOSIS — Z885 Allergy status to narcotic agent status: Secondary | ICD-10-CM | POA: Insufficient documentation

## 2018-08-11 DIAGNOSIS — Z7982 Long term (current) use of aspirin: Secondary | ICD-10-CM | POA: Insufficient documentation

## 2018-08-11 DIAGNOSIS — Z79899 Other long term (current) drug therapy: Secondary | ICD-10-CM | POA: Insufficient documentation

## 2018-08-11 DIAGNOSIS — Z923 Personal history of irradiation: Secondary | ICD-10-CM | POA: Diagnosis not present

## 2018-08-11 DIAGNOSIS — M1990 Unspecified osteoarthritis, unspecified site: Secondary | ICD-10-CM | POA: Insufficient documentation

## 2018-08-11 DIAGNOSIS — F419 Anxiety disorder, unspecified: Secondary | ICD-10-CM | POA: Insufficient documentation

## 2018-08-11 DIAGNOSIS — F418 Other specified anxiety disorders: Secondary | ICD-10-CM | POA: Diagnosis not present

## 2018-08-11 DIAGNOSIS — I10 Essential (primary) hypertension: Secondary | ICD-10-CM | POA: Insufficient documentation

## 2018-08-11 DIAGNOSIS — E785 Hyperlipidemia, unspecified: Secondary | ICD-10-CM | POA: Diagnosis not present

## 2018-08-11 DIAGNOSIS — Z9221 Personal history of antineoplastic chemotherapy: Secondary | ICD-10-CM | POA: Diagnosis not present

## 2018-08-11 DIAGNOSIS — Z85118 Personal history of other malignant neoplasm of bronchus and lung: Secondary | ICD-10-CM | POA: Diagnosis not present

## 2018-08-11 DIAGNOSIS — Z888 Allergy status to other drugs, medicaments and biological substances status: Secondary | ICD-10-CM | POA: Insufficient documentation

## 2018-08-11 DIAGNOSIS — Z9981 Dependence on supplemental oxygen: Secondary | ICD-10-CM | POA: Insufficient documentation

## 2018-08-11 HISTORY — PX: CATARACT EXTRACTION W/PHACO: SHX586

## 2018-08-11 HISTORY — DX: Hypoxemia: R09.02

## 2018-08-11 SURGERY — PHACOEMULSIFICATION, CATARACT, WITH IOL INSERTION
Anesthesia: Monitor Anesthesia Care | Site: Eye | Laterality: Right

## 2018-08-11 MED ORDER — LIDOCAINE HCL (PF) 4 % IJ SOLN
INTRAMUSCULAR | Status: AC
  Filled 2018-08-11: qty 5

## 2018-08-11 MED ORDER — LIDOCAINE HCL (PF) 4 % IJ SOLN
INTRAOCULAR | Status: DC | PRN
  Administered 2018-08-11: 4 mL via OPHTHALMIC

## 2018-08-11 MED ORDER — NA HYALUR & NA CHOND-NA HYALUR 0.4-0.35 ML IO KIT
PACK | INTRAOCULAR | Status: DC | PRN
  Administered 2018-08-11: .35 mL via INTRAOCULAR

## 2018-08-11 MED ORDER — FENTANYL CITRATE (PF) 100 MCG/2ML IJ SOLN
INTRAMUSCULAR | Status: AC
  Filled 2018-08-11: qty 2

## 2018-08-11 MED ORDER — EPINEPHRINE PF 1 MG/ML IJ SOLN
INTRAOCULAR | Status: DC | PRN
  Administered 2018-08-11: 200 mL via OPHTHALMIC

## 2018-08-11 MED ORDER — SODIUM CHLORIDE 0.9 % IV SOLN
INTRAVENOUS | Status: DC
  Administered 2018-08-11: 08:00:00 via INTRAVENOUS

## 2018-08-11 MED ORDER — TETRACAINE HCL 0.5 % OP SOLN
OPHTHALMIC | Status: AC
  Administered 2018-08-11: 1 [drp] via OPHTHALMIC
  Filled 2018-08-11: qty 4

## 2018-08-11 MED ORDER — ARMC OPHTHALMIC DILATING DROPS
OPHTHALMIC | Status: AC
  Administered 2018-08-11: 1 via OPHTHALMIC
  Filled 2018-08-11: qty 0.5

## 2018-08-11 MED ORDER — NEOMYCIN-POLYMYXIN-DEXAMETH 0.1 % OP OINT
TOPICAL_OINTMENT | OPHTHALMIC | Status: DC | PRN
  Administered 2018-08-11: 1 via OPHTHALMIC

## 2018-08-11 MED ORDER — MIDAZOLAM HCL 2 MG/2ML IJ SOLN
INTRAMUSCULAR | Status: DC | PRN
  Administered 2018-08-11 (×3): 1 mg via INTRAVENOUS

## 2018-08-11 MED ORDER — FENTANYL CITRATE (PF) 100 MCG/2ML IJ SOLN
INTRAMUSCULAR | Status: DC | PRN
  Administered 2018-08-11 (×2): 25 ug via INTRAVENOUS
  Administered 2018-08-11: 50 ug via INTRAVENOUS

## 2018-08-11 MED ORDER — POVIDONE-IODINE 5 % OP SOLN
OPHTHALMIC | Status: AC
  Filled 2018-08-11: qty 30

## 2018-08-11 MED ORDER — MIDAZOLAM HCL 2 MG/2ML IJ SOLN
INTRAMUSCULAR | Status: AC
  Filled 2018-08-11: qty 2

## 2018-08-11 MED ORDER — NA HYALUR & NA CHOND-NA HYALUR 0.55-0.5 ML IO KIT
PACK | INTRAOCULAR | Status: AC
  Filled 2018-08-11: qty 1.05

## 2018-08-11 MED ORDER — TETRACAINE HCL 0.5 % OP SOLN
1.0000 [drp] | OPHTHALMIC | Status: AC | PRN
  Administered 2018-08-11 (×3): 1 [drp] via OPHTHALMIC

## 2018-08-11 MED ORDER — MOXIFLOXACIN HCL 0.5 % OP SOLN
1.0000 [drp] | OPHTHALMIC | Status: AC
  Administered 2018-08-11 (×3): 1 [drp] via OPHTHALMIC

## 2018-08-11 MED ORDER — EPINEPHRINE PF 1 MG/ML IJ SOLN
INTRAMUSCULAR | Status: AC
  Filled 2018-08-11: qty 2

## 2018-08-11 MED ORDER — MOXIFLOXACIN HCL 0.5 % OP SOLN
OPHTHALMIC | Status: AC
  Administered 2018-08-11: 1 [drp] via OPHTHALMIC
  Filled 2018-08-11: qty 6

## 2018-08-11 MED ORDER — POVIDONE-IODINE 5 % OP SOLN
OPHTHALMIC | Status: DC | PRN
  Administered 2018-08-11: 2 via OPHTHALMIC

## 2018-08-11 MED ORDER — ARMC OPHTHALMIC DILATING DROPS
1.0000 "application " | OPHTHALMIC | Status: AC
  Administered 2018-08-11 (×3): 1 via OPHTHALMIC

## 2018-08-11 SURGICAL SUPPLY — 18 items
GLOVE BIO SURGEON STRL SZ8 (GLOVE) ×3 IMPLANT
GLOVE BIOGEL M 6.5 STRL (GLOVE) ×3 IMPLANT
GLOVE SURG LX 7.5 STRW (GLOVE) ×2
GLOVE SURG LX STRL 7.5 STRW (GLOVE) ×1 IMPLANT
GOWN STRL REUS W/ TWL LRG LVL3 (GOWN DISPOSABLE) ×2 IMPLANT
GOWN STRL REUS W/TWL LRG LVL3 (GOWN DISPOSABLE) ×4
LABEL CATARACT MEDS ST (LABEL) ×3 IMPLANT
LENS IOL TECNIS ITEC 25.0 (Intraocular Lens) ×2 IMPLANT
NDL HPO THNWL 1X22GA REG BVL (NEEDLE) ×1 IMPLANT
NEEDLE SAFETY 22GX1 (NEEDLE) ×2
PACK CATARACT (MISCELLANEOUS) ×3 IMPLANT
PACK CATARACT BRASINGTON LX (MISCELLANEOUS) ×3 IMPLANT
PACK EYE AFTER SURG (MISCELLANEOUS) ×3 IMPLANT
SOL BSS BAG (MISCELLANEOUS) ×3
SOLUTION BSS BAG (MISCELLANEOUS) ×1 IMPLANT
SYR 5ML LL (SYRINGE) ×3 IMPLANT
WATER STERILE IRR 250ML POUR (IV SOLUTION) ×3 IMPLANT
WIPE NON LINTING 3.25X3.25 (MISCELLANEOUS) ×3 IMPLANT

## 2018-08-11 NOTE — Anesthesia Procedure Notes (Signed)
Performed by: Vaughan Sine Pre-anesthesia Checklist: Emergency Drugs available, Patient identified, Suction available, Patient being monitored and Timeout performed Patient Re-evaluated:Patient Re-evaluated prior to induction Oxygen Delivery Method: Nasal cannula Preoxygenation: Pre-oxygenation with 100% oxygen Induction Type: IV induction Placement Confirmation: positive ETCO2 and CO2 detector

## 2018-08-11 NOTE — Anesthesia Postprocedure Evaluation (Signed)
Anesthesia Post Note  Patient: WANNETTA LANGLAND  Procedure(s) Performed: CATARACT EXTRACTION PHACO AND INTRAOCULAR LENS PLACEMENT ([place]) (Right Eye)  Patient location during evaluation: PACU Anesthesia Type: MAC Level of consciousness: awake and alert and oriented Pain management: pain level controlled Vital Signs Assessment: post-procedure vital signs reviewed and stable Respiratory status: spontaneous breathing, nonlabored ventilation and respiratory function stable Cardiovascular status: blood pressure returned to baseline and stable Postop Assessment: no signs of nausea or vomiting Anesthetic complications: no     Last Vitals:  Vitals:   08/11/18 0930 08/11/18 0936  BP: 102/61 (!) 102/51  Pulse:  (!) 57  Resp: 16   Temp: 36.4 C   SpO2: 97% 99%    Last Pain:  Vitals:   08/11/18 0930  TempSrc:   PainSc: 0-No pain                 Sundeep Destin

## 2018-08-11 NOTE — Anesthesia Preprocedure Evaluation (Signed)
Anesthesia Evaluation  Patient identified by MRN, date of birth, ID band Patient awake    Reviewed: Allergy & Precautions, NPO status , Patient's Chart, lab work & pertinent test results  History of Anesthesia Complications Negative for: history of anesthetic complications  Airway Mallampati: II  TM Distance: >3 FB Neck ROM: Full    Dental  (+) Edentulous Upper, Edentulous Lower   Pulmonary asthma , COPD (uses 2L at night),  oxygen dependent, former smoker,    breath sounds clear to auscultation- rhonchi (-) wheezing      Cardiovascular hypertension, Pt. on medications (-) CAD, (-) Past MI, (-) Cardiac Stents and (-) CABG  Rhythm:Regular Rate:Normal - Systolic murmurs and - Diastolic murmurs    Neuro/Psych  Headaches, PSYCHIATRIC DISORDERS Anxiety Depression    GI/Hepatic negative GI ROS, Neg liver ROS,   Endo/Other  negative endocrine ROSneg diabetes  Renal/GU negative Renal ROS     Musculoskeletal  (+) Arthritis ,   Abdominal (+) + obese,   Peds  Hematology  (+) anemia ,   Anesthesia Other Findings Past Medical History: No date: Anemia     Comment:  vitamin b12 deficiency. no longer taking supplements No date: Anxiety No date: Arthritis No date: Asthma No date: Back pain No date: Bruises easily 2009: Cancer ([place])     Comment:  LARYNX CANCER - CHEMO / RADIATION (NO SURGERY)  No date: Chronic leg pain     Comment:  BILATERAL No date: COPD (chronic obstructive pulmonary disease) (HCC)     Comment:  no inhalers for over 1 year No date: Depression No date: Difficulty sleeping No date: Diverticulitis 04/2018: Dysrhythmia     Comment:  brady episodes. cleared by dr. Clayborn Bigness 03/28/2015: Facet syndrome, lumbar No date: Hemorrhoids 04/2018: History of kidney stones     Comment:  recently passed stone No date: Hyperlipidemia 2019: Hypertension     Comment:  not on any treatment currently No date: Incontinence  of urine 2009: Laryngeal cancer ([place]) 04/2018: Lung cancer ([place])     Comment:  diagnosed via ct scan No date: Migraine 09/11/2016: Migraine without aura and without status migrainosus, not  intractable 11/23/2017: Mild cognitive impairment No date: MRSA infection greater than 3 months ago     Comment:  2008 right side of face No date: Nerve damage     Comment:  right leg. thinks this is from her TKR 04/22/2018: Nodule of lower lobe of right lung No date: Oxygen deficiency     Comment:  2L/HS No date: Wears dentures     Comment:  full upper and lower   Reproductive/Obstetrics                             Anesthesia Physical Anesthesia Plan  ASA: III  Anesthesia Plan: MAC   Post-op Pain Management:    Induction: Intravenous  PONV Risk Score and Plan: 2 and Midazolam  Airway Management Planned: Natural Airway  Additional Equipment:   Intra-op Plan:   Post-operative Plan:   Informed Consent: I have reviewed the patients History and Physical, chart, labs and discussed the procedure including the risks, benefits and alternatives for the proposed anesthesia with the patient or authorized representative who has indicated his/her understanding and acceptance.     Plan Discussed with: CRNA and Anesthesiologist  Anesthesia Plan Comments:         Anesthesia Quick Evaluation

## 2018-08-11 NOTE — Anesthesia Postprocedure Evaluation (Deleted)
Anesthesia Post Note  Patient: Susan Houston  Procedure(s) Performed: CATARACT EXTRACTION PHACO AND INTRAOCULAR LENS PLACEMENT ([facility]) (Right Eye)  Patient location during evaluation: PACU Anesthesia Type: MAC Level of consciousness: awake and alert Pain management: pain level controlled Vital Signs Assessment: post-procedure vital signs reviewed and stable Respiratory status: spontaneous breathing, nonlabored ventilation, respiratory function stable and patient connected to nasal cannula oxygen Cardiovascular status: blood pressure returned to baseline and stable Postop Assessment: no apparent nausea or vomiting Anesthetic complications: no     Last Vitals:  Vitals:   08/11/18 0710  BP: 113/60  Pulse: 61  Resp: 16  Temp: (!) 36.2 C  SpO2: 97%    Last Pain:  Vitals:   08/11/18 0710  TempSrc: Temporal  PainSc: 0-No pain                 COOK-MARTIN,[name]

## 2018-08-11 NOTE — H&P (Signed)
The History and Physical notes are on paper, have been signed, and are to be scanned. The patient remains stable and unchanged from the H&P.   Previous H&P reviewed, patient examined, and there are no changes.  Thien Berka 08/11/2018 7:38 AM

## 2018-08-11 NOTE — Anesthesia Post-op Follow-up Note (Signed)
Anesthesia QCDR form completed.        

## 2018-08-11 NOTE — Transfer of Care (Signed)
Immediate Anesthesia Transfer of Care Note  Patient: Susan Houston  Procedure(s) Performed: CATARACT EXTRACTION PHACO AND INTRAOCULAR LENS PLACEMENT ([facility]) (Right Eye)  Patient Location: PACU  Anesthesia Type:MAC  Level of Consciousness: awake, alert  and oriented  Airway & Oxygen Therapy: Patient Spontanous Breathing and Patient connected to nasal cannula oxygen  Post-op Assessment: Report given to RN and Post -op Vital signs reviewed and stable  Post vital signs: Reviewed and stable  Last Vitals:  Vitals Value Taken Time  BP    Temp    Pulse    Resp    SpO2      Last Pain:  Vitals:   08/11/18 0710  TempSrc: Temporal  PainSc: 0-No pain         Complications: No apparent anesthesia complications

## 2018-08-11 NOTE — Discharge Instructions (Signed)
Eye Surgery Discharge Instructions    Expect mild scratchy sensation or mild soreness. DO NOT RUB YOUR EYE!  The day of surgery:  Minimal physical activity, but bed rest is not required  No reading, computer work, or close hand work  No bending, lifting, or straining.  May watch TV  For 24 hours:  No driving, legal decisions, or alcoholic beverages  Safety precautions  Eat anything you prefer: It is better to start with liquids, then soup then solid foods.  _____ Eye patch should be worn until postoperative exam tomorrow.  ____ Solar shield eyeglasses should be worn for comfort in the sunlight/patch while sleeping  Resume all regular medications including aspirin or Coumadin if these were discontinued prior to surgery. You may shower, bathe, shave, or wash your hair. Tylenol may be taken for mild discomfort.  Call your doctor if you experience significant pain, nausea, or vomiting, fever > 101 or other signs of infection. [phone] or [phone] Specific instructions:  Follow-up Information    Leandrew Koyanagi, MD Follow up.   Specialty:  Ophthalmology Why:  October 25 at 11:00am Contact information: [address]   [place] [address] [phone]

## 2018-08-11 NOTE — OR Nursing (Signed)
Discharge instructions discussed with pt and son. Both voice understanding. 

## 2018-08-11 NOTE — Op Note (Signed)
OPERATIVE NOTE  Susan Houston [id number] 08/11/2018   PREOPERATIVE DIAGNOSIS:  Nuclear Sclerotic Cataract Right Eye H25.11   POSTOPERATIVE DIAGNOSIS: Nuclear Sclerotic Cataract Right Eye H25.11          PROCEDURE:  Phacoemusification with posterior chamber intraocular lens placement of the right eye   LENS:   Implant Name Type Inv. Item Serial No. Manufacturer Lot No. LRB No. Used  LENS IOL DIOP 25.0 - [id number] Intraocular Lens LENS IOL DIOP 25.0 [id number] AMO  Right 1       ULTRASOUND TIME: 21 %  of 0 minutes 24 seconds, CDE 5.0  SURGEON:  Wyonia Hough, MD   ANESTHESIA:  Topical with tetracaine drops and 2% Xylocaine jelly, augmented with 1% preservative-free intracameral lidocaine.    COMPLICATIONS:  None.   DESCRIPTION OF PROCEDURE:  The patient was identified in the holding room and transported to the operating room and placed in the supine position under the operating microscope. Theright eye was identified as the operative eye and it was prepped and draped in the usual sterile ophthalmic fashion.   A 1 millimeter clear-corneal paracentesis was made at the 12:00 position.  0.5 ml of preservative-free 1% lidocaine was injected into the anterior chamber. The anterior chamber was filled with Viscoat viscoelastic.  A 2.4 millimeter keratome was used to make a near-clear corneal incision at the 9:00 position. A curvilinear capsulorrhexis was made with a cystotome and capsulorrhexis forceps.  Balanced salt solution was used to hydrodissect and hydrodelineate the nucleus.   Phacoemulsification was then used in stop and chop fashion to remove the lens nucleus and epinucleus.  The remaining cortex was then removed using the irrigation and aspiration handpiece. Provisc was then placed into the capsular bag to distend it for lens placement.  A lens was then injected into the capsular bag.  The remaining viscoelastic was aspirated.  Wounds were hydrated with balanced salt  solution.  The anterior chamber was inflated to a physiologic pressure with balanced salt solution. Vigamox 0.2 ml of a 1mg  per ml solution was injected into the anterior chamber for a dose of 0.2 mg of intracameral antibiotic at the completion of the case. Miostat was placed into the anterior chamber to constrict the pupil.  No wound leaks were noted.  Topical Vigamox drops and Maxitrol ointment were applied to the eye.  The patient was taken to the recovery room in stable condition without complications of anesthesia or surgery.  Randeep Biondolillo 08/11/2018, 9:29 AM

## 2018-08-17 ENCOUNTER — Ambulatory Visit: Payer: Medicare Other | Admitting: Family Medicine

## 2018-08-18 ENCOUNTER — Encounter: Payer: Self-pay | Admitting: Nurse Practitioner

## 2018-08-18 ENCOUNTER — Other Ambulatory Visit: Payer: Self-pay

## 2018-08-18 ENCOUNTER — Telehealth: Payer: Self-pay | Admitting: Family Medicine

## 2018-08-18 ENCOUNTER — Ambulatory Visit: Payer: Medicare HMO | Attending: Nurse Practitioner | Admitting: Nurse Practitioner

## 2018-08-18 DIAGNOSIS — Z79891 Long term (current) use of opiate analgesic: Secondary | ICD-10-CM | POA: Diagnosis not present

## 2018-08-18 DIAGNOSIS — M899 Disorder of bone, unspecified: Secondary | ICD-10-CM

## 2018-08-18 DIAGNOSIS — L7682 Other postprocedural complications of skin and subcutaneous tissue: Secondary | ICD-10-CM | POA: Diagnosis not present

## 2018-08-18 DIAGNOSIS — G894 Chronic pain syndrome: Secondary | ICD-10-CM | POA: Insufficient documentation

## 2018-08-18 DIAGNOSIS — Z789 Other specified health status: Secondary | ICD-10-CM | POA: Diagnosis not present

## 2018-08-18 DIAGNOSIS — Z23 Encounter for immunization: Secondary | ICD-10-CM

## 2018-08-18 DIAGNOSIS — R0781 Pleurodynia: Secondary | ICD-10-CM | POA: Insufficient documentation

## 2018-08-18 DIAGNOSIS — Z79899 Other long term (current) drug therapy: Secondary | ICD-10-CM

## 2018-08-18 NOTE — Telephone Encounter (Signed)
Copied from [place] [id number]. Topic: General - Other >> Aug 18, 2018  4:15 PM Keene Breath wrote: Reason for CRM: Patient called to inform the doctor that her insurance company will not pay for her to have the high dose flu shot because she is not 64 yrs. Old.  Patient would like to know what should she do at this point.  Please advise and call patient back to see if she should get the low dose shot.  CB# [phone]

## 2018-08-18 NOTE — Progress Notes (Signed)
Safety precautions to be maintained throughout the outpatient stay will include: orient to surroundings, keep bed in low position, maintain call bell within reach at all times, provide assistance with transfer out of bed and ambulation.  

## 2018-08-18 NOTE — Progress Notes (Signed)
Patient's Name: Susan Houston  MRN: [id number]  Referring Provider: Valerie Roys, DO  DOB: 03-Jan-1961  PCP: Valerie Roys, DO  DOS: 08/18/2018  Note by: Dionisio David NP  Service setting: Ambulatory outpatient  Specialty: Interventional Pain Management  Location: [facility] (AMB) Pain Management Facility    Patient type: New Patient    Primary Reason(s) for Visit: Initial Patient Evaluation CC: Back Pain (right and mid SP right lower lobectomy 04/2018)  HPI  Susan Houston is a 57 y.o. year old, female patient, who comes today for an initial evaluation. She has Saphenous neuralgia; DDD (degenerative disc disease), lumbosacral; Sacroiliac joint dysfunction; DDD (degenerative disc disease), lumbar; Facet syndrome, lumbar; Chronic pain; Severe depression ([place]); Chronic anxiety; Coarse tremors; Hyperlipidemia; COPD (chronic obstructive pulmonary disease) ([place]); Migraine without aura and without status migrainosus, not intractable; Frequent falls; Mild cognitive impairment; Vitamin B12 deficiency; Laryngeal cancer ([place]); Mass of lower lobe of right lung; Lung cancer ([place]); Primary cancer of right lower lobe of lung ([place]); Controlled substance agreement signed; Rib pain on right side (Primary Area of Pain); Incisional pain (Secondary Area of Pain); Chronic pain syndrome; Long term current use of opiate analgesic; Pharmacologic therapy; Disorder of skeletal system; and Problems influencing health status on their problem list.. Her primarily concern today is the Back Pain (right and mid SP right lower lobectomy 04/2018)  Pain Assessment: Location: Right, Mid Back Radiating: right breast, right rib cage Onset: More than a month ago Duration: Chronic pain Quality: Aching, Shooting, Sharp, Throbbing, Sore, Tender Severity: 7 /10 (subjective, self-reported pain score)  Note: Reported level is compatible with observation. Clinically the patient looks like a 2/10 A 2/10 is viewed as "Mild to Moderate" and described as  noticeable and distracting. Impossible to hide from other people. More frequent flare-ups. Still possible to adapt and function close to normal. It can be very annoying and may have occasional stronger flare-ups. With discipline, patients may get used to it and adapt. Information on the proper use of the pain scale provided to the patient today. When using our objective Pain Scale, levels between 6 and 10/10 are said to belong in an emergency room, as it progressively worsens from a 6/10, described as severely limiting, requiring emergency care not usually available at an outpatient pain management facility. At a 6/10 level, communication becomes difficult and requires great effort. Assistance to reach the emergency department may be required. Facial flushing and profuse sweating along with potentially dangerous increases in heart rate and blood pressure will be evident. Effect on ADL:   Timing: Constant Modifying factors: medications, BP: (!) 135/100  HR: 70  Onset and Duration: Sudden and Date of onset: 05/16/2018 Cause of pain: Surgery Severity: No change since onset, NAS-11 at its worse: 8/10, NAS-11 at its best: 5/10, NAS-11 now: 8/10 and NAS-11 on the average: 8/10 Timing: Not influenced by the time of the day Aggravating Factors: Lifiting, Motion, Prolonged sitting, Prolonged standing and Surgery made it worse Alleviating Factors: Medications Associated Problems: Fatigue, Numbness, Spasms, Swelling, Pain that wakes patient up and Pain that does not allow patient to sleep Quality of Pain: Aching, Constant, Horrible, Sharp, Stabbing, Throbbing and Uncomfortable Previous Examinations or Tests: CT scan and X-rays Previous Treatments: Narcotic medications  The patient comes into the clinics today for the first time for a chronic pain management evaluation.  She admits that she is having right side pain above and below her breast.  She was recently diagnosed with lung cancer July 2019 and  underwent surgery.  She had a right partial lung lobectomy she admits that she was not having any pain prior to the surgery.  She now she is having sharp and gnawing throbbing type pain that is uncomfortable.  She admits that when she raises her arm there is a pulling type pain.  She denies any interventional therapy or physical therapy.  She has had a CT scan is also had rib x-rays.  She will have a repeat CT scan on November 18.  Is a previous patient of Dr. Mohammed Kindle.  She admits that she does not have any other areas of pain.  Today I took the time to provide the patient with information regarding this pain practice. The patient was informed that the practice is divided into two sections: an interventional pain management section, as well as a completely separate and distinct medication management section. I explained that there are procedure days for interventional therapies, and evaluation days for follow-ups and medication management. Because of the amount of documentation required during both, they are kept separated. This means that there is the possibility that she may be scheduled for a procedure on one day, and medication management the next. I have also informed her that because of staffing and facility limitations, this practice will no longer take patients for medication management only. To illustrate the reasons for this, I gave the patient the example of surgeons, and how inappropriate it would be to refer a patient to his/her care, just to write for the post-surgical antibiotics on a surgery done by a different surgeon.   Because interventional pain management is part of the board-certified specialty for the doctors, the patient was informed that joining this practice means that they are open to any and all interventional therapies. I made it clear that this does not mean that they will be forced to have any procedures done. What this means is that I believe interventional therapies to be  essential part of the diagnosis and proper management of chronic pain conditions. Therefore, patients not interested in these interventional alternatives will be better served under the care of a different practitioner.  The patient was also made aware of my Comprehensive Pain Management Safety Guidelines where by joining this practice, they limit all of their nerve blocks and joint injections to those done by our practice, for as long as we are retained to manage their care. Historic Controlled Substance Pharmacotherapy Review  PMP and historical list of controlled substances: Xtampza ER 9 mg, oxycodone/acetaminophen 5/325 mg, tramadol 50 mg, oxycodone/acetaminophen 7.5/325 mg, lorazepam 0.5 mg, zolpidem 10 mg, Nucynta 50 mg, oxycodone 5 mg, temazepam 30 mg, temazepam 15 mg, hydromorphone 2 mg, Highest opioid analgesic regimen found: Oxycodone 5 mg 2 tablets every 4 hours (last fill date 09/25/2014) oxycodone 10 mg/day Most recent opioid analgesic: Xtampza ER 9 mg twice daily this fill date 08/08/2018) Xtampza ER 18 mg/day Current opioid analgesics:  Xtampza ER 9 mg twice daily this fill date 08/08/2018) Xtampza ER 18 mg/day Highest recorded MME/day: 67m/day MME/day: 27 mg/day Medications: The patient did not bring the medication(s) to the appointment, as requested in our "New Patient Package" Pharmacodynamics: Desired effects: Analgesia: The patient reports >50% benefit. Reported improvement in function: The patient reports medication allows her to accomplish basic ADLs. Clinically meaningful improvement in function (CMIF): Sustained CMIF goals met Perceived effectiveness: Described as relatively effective, allowing for increase in activities of daily living (ADL) Undesirable effects: Side-effects or Adverse reactions: None reported Historical Monitoring: The patient  reports that she does not use drugs. List of all UDS Test(s): No results found for: MDMA, COCAINSCRNUR, PCPSCRNUR, PCPQUANT,  CANNABQUANT, THCU, [place] List of all Serum Drug Screening Test(s):  No results found for: AMPHSCRSER, BARBSCRSER, BENZOSCRSER, COCAINSCRSER, PCPSCRSER, PCPQUANT, THCSCRSER, CANNABQUANT, OPIATESCRSER, OXYSCRSER, PROPOXSCRSER Historical Background Evaluation: [place] PDMP: Six (6) year initial data search conducted.             [place] Department of public safety, offender search: Editor, commissioning Information) Non-contributory Risk Assessment Profile: Aberrant behavior: None observed or detected today Risk factors for fatal opioid overdose: None identified today Fatal overdose hazard ratio (HR): Calculation deferred Non-fatal overdose hazard ratio (HR): Calculation deferred Risk of opioid abuse or dependence: 0.7-3.0% with doses ? 36 MME/day and 6.1-26% with doses ? 120 MME/day. Substance use disorder (SUD) risk level: Pending results of Medical Psychology Evaluation for SUD Opioid risk tool (ORT) (Total Score): 1  ORT Scoring interpretation table:  Score <3 = Low Risk for SUD  Score between 4-7 = Moderate Risk for SUD  Score >8 = High Risk for Opioid Abuse   PHQ-2 Depression Scale:  Total score: 0  PHQ-2 Scoring interpretation table: (Score and probability of major depressive disorder)  Score 0 = No depression  Score 1 = 15.4% Probability  Score 2 = 21.1% Probability  Score 3 = 38.4% Probability  Score 4 = 45.5% Probability  Score 5 = 56.4% Probability  Score 6 = 78.6% Probability   PHQ-9 Depression Scale:  Total score: 0  PHQ-9 Scoring interpretation table:  Score 0-4 = No depression  Score 5-9 = Mild depression  Score 10-14 = Moderate depression  Score 15-19 = Moderately severe depression  Score 20-27 = Severe depression (2.4 times higher risk of SUD and 2.89 times higher risk of overuse)   Pharmacologic Plan: Pending ordered tests and/or consults  Meds  The patient has a current medication list which includes the following prescription(s): albuterol, aspirin, gabapentin, oxycodone er,  tizanidine, and trazodone, and the following Facility-Administered Medications: ipratropium-albuterol.  Current Outpatient Medications on File Prior to Visit  Medication Sig  . albuterol (PROVENTIL HFA;VENTOLIN HFA) 108 (90 Base) MCG/ACT inhaler Inhale 2 puffs into the lungs every 4 (four) hours as needed for wheezing or shortness of breath.  Marland Kitchen aspirin 81 MG tablet Take 81 mg by mouth daily.  Marland Kitchen gabapentin (NEURONTIN) 300 MG capsule Take 1 capsule (300 mg total) by mouth 3 (three) times daily.  Marland Kitchen oxyCODONE ER (XTAMPZA ER) 9 MG C12A Take 9 mg by mouth 2 (two) times daily.  Marland Kitchen tiZANidine (ZANAFLEX) 4 MG tablet Take 2 mg by mouth 3 (three) times daily.  . traZODone (DESYREL) 100 MG tablet Take 100 mg by mouth at bedtime.    Current Facility-Administered Medications on File Prior to Visit  Medication  . ipratropium-albuterol (DUONEB) 0.5-2.5 (3) MG/3ML nebulizer solution 3 mL   Imaging Review    ROS  Cardiovascular History: Daily Aspirin intake Pulmonary or Respiratory History: Lung problems, Difficulty blowing air out (Emphysema), Shortness of breath and Snoring  Neurological History: No reported neurological signs or symptoms such as seizures, abnormal skin sensations, urinary and/or fecal incontinence, being born with an abnormal open spine and/or a tethered spinal cord Review of Past Neurological Studies:  Results for orders placed or performed during the hospital encounter of 09/28/16  MR Brain Wo Contrast   Narrative   CLINICAL DATA:  Chronic migraines, with sudden increase in intensity 2 months ago. Fall 3 weeks ago.  EXAM: MRI HEAD WITHOUT  CONTRAST  TECHNIQUE: Multiplanar, multiecho pulse sequences of the brain and surrounding structures were obtained without intravenous contrast.  COMPARISON:  Head CT 01/19/2014  FINDINGS: The study is mildly motion degraded.  Brain: There is no evidence of acute infarct, intracranial hemorrhage, mass, midline shift, or extra-axial fluid  collection. The ventricles and sulci are normal. Small foci of T2 hyperintensity in the deep cerebral white matter bilaterally are mild for age.  Vascular: Major intracranial vascular flow voids are preserved.  Skull and upper cervical spine: Unremarkable bone marrow signal.  Sinuses/Orbits: Unremarkable orbits. Paranasal sinuses and mastoid air cells are clear.  Other: None.  IMPRESSION: 1. No acute intracranial abnormality or mass. 2. Mild cerebral white matter T2 signal changes, nonspecific but may reflect chronic small vessel ischemic disease or migraines.   Electronically Signed   By: Logan Bores M.D.   On: 09/28/2016 09:31    Psychological-Psychiatric History: No reported psychological or psychiatric signs or symptoms such as difficulty sleeping, anxiety, depression, delusions or hallucinations (schizophrenial), mood swings (bipolar disorders) or suicidal ideations or attempts Gastrointestinal History: No reported gastrointestinal signs or symptoms such as vomiting or evacuating blood, reflux, heartburn, alternating episodes of diarrhea and constipation, inflamed or scarred liver, or pancreas or irrregular and/or infrequent bowel movements Genitourinary History: Passing kidney stones Hematological History: Weakness due to low blood hemoglobin or red blood cell count (Anemia), Brusing easily and Bleeding easily Endocrine History: No reported endocrine signs or symptoms such as high or low blood sugar, rapid heart rate due to high thyroid levels, obesity or weight gain due to slow thyroid or thyroid disease Rheumatologic History: No reported rheumatological signs and symptoms such as fatigue, joint pain, tenderness, swelling, redness, heat, stiffness, decreased range of motion, with or without associated rash Musculoskeletal History: Negative for myasthenia gravis, muscular dystrophy, multiple sclerosis or malignant hyperthermia Work History: Disabled  Allergies  Susan Houston is  allergic to morphine and related; adhesive [tape]; and lexapro [escitalopram oxalate].  Laboratory Chemistry  Inflammation Markers Lab Results  Component Value Date   ESRSEDRATE 16 11/19/2011   (CRP: Acute Phase) (ESR: Chronic Phase) Renal Function Markers Lab Results  Component Value Date   BUN 16 07/20/2018   CREATININE 0.81 07/20/2018   GFRAA >60 07/20/2018   GFRNONAA >60 07/20/2018   Hepatic Function Markers Lab Results  Component Value Date   AST 16 07/20/2018   ALT 11 07/20/2018   ALBUMIN 3.6 07/20/2018   ALKPHOS 87 07/20/2018   Electrolytes Lab Results  Component Value Date   NA 137 07/20/2018   K 4.5 07/20/2018   CL 105 07/20/2018   CALCIUM 8.9 07/20/2018   MG 2.1 03/02/2018   Neuropathy Markers No results found for: [id number] Bone Pathology Markers Lab Results  Component Value Date   ALKPHOS 87 07/20/2018   CALCIUM 8.9 07/20/2018   Coagulation Parameters Lab Results  Component Value Date   INR 1.13 05/12/2018   LABPROT 14.4 05/12/2018   APTT 32 05/12/2018   PLT 249 07/20/2018   Cardiovascular Markers Lab Results  Component Value Date   HGB 10.7 (L) 07/20/2018   HCT 31.8 (L) 07/20/2018   Note: Lab results reviewed.  [place]  Drug: Susan Houston  reports that she does not use drugs. Alcohol:  reports that she does not drink alcohol. Tobacco:  reports that she quit smoking about 2 years ago. Her smoking use included cigarettes. She smoked 0.50 packs per day. She has never used smokeless tobacco. Medical:  has a past medical history of  Anemia, Anxiety, Arthritis, Asthma, Back pain, Bruises easily, Cancer ([place]) (2009), Chronic leg pain, COPD (chronic obstructive pulmonary disease) ([place]), Depression, Difficulty sleeping, Diverticulitis, Dysrhythmia (04/2018), Facet syndrome, lumbar (03/28/2015), Hemorrhoids, History of kidney stones (04/2018), Hyperlipidemia, Hypertension (2019), Incontinence of urine, Laryngeal cancer ([place]) (2009), Lung cancer ([place]) (04/2018),  Migraine, Migraine without aura and without status migrainosus, not intractable (09/11/2016), Mild cognitive impairment (11/23/2017), MRSA infection greater than 3 months ago, Nerve damage, Nodule of lower lobe of right lung (04/22/2018), Oxygen deficiency, and Wears dentures. Family: family history includes Alcohol abuse in her brother; Alzheimer's disease in her paternal grandfather; Anxiety disorder in her sister; Arthritis in her father; Asthma in her mother; Breast cancer (age of onset: 59) in her maternal grandmother; Cancer in her father and maternal grandfather; Cirrhosis in her mother; Depression in her sister; Diabetes in her mother and sister; Gout in her sister; Heart disease in her brother; Hyperlipidemia in her father and mother; Hypertension in her father, mother, and sister; Kidney Stones in her son; Pneumonia in her paternal grandmother; Stroke in her maternal grandmother.  Past Surgical History:  Procedure Laterality Date  . ABDOMINAL HYSTERECTOMY  1983  . BLADDER SUSPENSION  2014  . CATARACT EXTRACTION W/PHACO Left 05/11/2018   Procedure: CATARACT EXTRACTION PHACO AND INTRAOCULAR LENS PLACEMENT ([employer]) LEFT;  Surgeon: Leandrew Koyanagi, MD;  Location: [place];  Service: Ophthalmology;  Laterality: Left;  PREFERS EARLY  . CATARACT EXTRACTION W/PHACO Right 08/11/2018   Procedure: CATARACT EXTRACTION PHACO AND INTRAOCULAR LENS PLACEMENT ([facility]);  Surgeon: Leandrew Koyanagi, MD;  Location: [facility];  Service: Ophthalmology;  Laterality: Right;  CDE 5:02 Total Time 01:35 Fluid lot # [id number]  . CHOLECYSTECTOMY  1998  . COLONOSCOPY    . ESOPHAGOGASTRODUODENOSCOPY  2015  . ESOPHAGOGASTRODUODENOSCOPY (EGD) WITH PROPOFOL N/A 11/20/2016   Procedure: ESOPHAGOGASTRODUODENOSCOPY (EGD) WITH PROPOFOL;  Surgeon: Jonathon Bellows, MD;  Location: [facility];  Service: Endoscopy;  Laterality: N/A;  . EXCISION NEUROMA Bilateral 09/24/2014   Procedure: BILATERAL OPEN SAPHENOUS NEURECTOMIES AND  OPEN LEFT PERIPATELLA OSETOPHYTECTOMY;  Surgeon: Mauri Pole, MD;  Location: [facility];  Service: Orthopedics;  Laterality: Bilateral;  . EYE SURGERY Left 05/11/2018   cataract extraction  . JOINT REPLACEMENT  2009/2010   BIL TOTAL KNEES  . THORACOTOMY/LOBECTOMY Right 05/16/2018   Procedure: THORACOTOMY/POSSIBLE LOBECTOMY;  Surgeon: Nestor Lewandowsky, MD;  Location: [facility];  Service: Thoracic;  Laterality: Right;  . TONSILLECTOMY    . VIDEO BRONCHOSCOPY N/A 05/16/2018   Procedure: PREOP  BRONCHOSCOPY;  Surgeon: Nestor Lewandowsky, MD;  Location: [facility];  Service: Thoracic;  Laterality: N/A;   Active Ambulatory Problems    Diagnosis Date Noted  . Saphenous neuralgia 09/24/2014  . DDD (degenerative disc disease), lumbosacral 02/23/2015  . Sacroiliac joint dysfunction 02/23/2015  . DDD (degenerative disc disease), lumbar 03/05/2015  . Facet syndrome, lumbar 03/28/2015  . Chronic pain 12/24/2015  . Severe depression ([place]) 12/24/2015  . Chronic anxiety 12/24/2015  . Coarse tremors 12/24/2015  . Hyperlipidemia   . COPD (chronic obstructive pulmonary disease) ([place])   . Migraine without aura and without status migrainosus, not intractable 09/11/2016  . Frequent falls 11/23/2017  . Mild cognitive impairment 11/23/2017  . Vitamin B12 deficiency 12/15/2017  . Laryngeal cancer ([place]) 04/22/2018  . Mass of lower lobe of right lung 04/22/2018  . Lung cancer ([place]) 05/16/2018  . Primary cancer of right lower lobe of lung ([place]) 05/27/2018  . Controlled substance agreement signed 06/28/2018  . Rib pain on right side (Primary Area  of Pain) 08/18/2018  . Incisional pain (Secondary Area of Pain) 08/18/2018  . Chronic pain syndrome 08/18/2018  . Long term current use of opiate analgesic 08/18/2018  . Pharmacologic therapy 08/18/2018  . Disorder of skeletal system 08/18/2018  . Problems influencing health status 08/18/2018   Resolved Ambulatory Problems    Diagnosis Date Noted  . S/P excision saphenous  neuroma 09/24/2014  . Hypertension   . Smoker 03/03/2016  . Lumbar radiculopathy 05/13/2016  . Mild dementia ([place]) 11/23/2017   Past Medical History:  Diagnosis Date  . Anemia   . Anxiety   . Arthritis   . Asthma   . Back pain   . Bruises easily   . Cancer ([place]) 2009  . Chronic leg pain   . Depression   . Difficulty sleeping   . Diverticulitis   . Dysrhythmia 04/2018  . Hemorrhoids   . History of kidney stones 04/2018  . Incontinence of urine   . Migraine   . MRSA infection greater than 3 months ago   . Nerve damage   . Nodule of lower lobe of right lung 04/22/2018  . Oxygen deficiency   . Wears dentures    Constitutional Exam  General appearance: Well nourished, well developed, and well hydrated. In no apparent acute distress Vitals:   08/18/18 1414  BP: (!) 135/100  Pulse: 70  Temp: 98.5 F (36.9 C)  TempSrc: Oral  Weight: 197 lb (89.4 kg)  Height: 5' 2"  (1.575 m)   BMI Assessment: Estimated body mass index is 36.03 kg/m as calculated from the following:   Height as of this encounter: 5' 2"  (1.575 m).   Weight as of this encounter: 197 lb (89.4 kg).  BMI interpretation table: BMI level Category Range association with higher incidence of chronic pain  <18 kg/m2 Underweight   18.5-24.9 kg/m2 Ideal body weight   25-29.9 kg/m2 Overweight Increased incidence by 20%  30-34.9 kg/m2 Obese (Class I) Increased incidence by 68%  35-39.9 kg/m2 Severe obesity (Class II) Increased incidence by 136%  >40 kg/m2 Extreme obesity (Class III) Increased incidence by 254%   BMI Readings from Last 4 Encounters:  08/18/18 36.03 kg/m  08/03/18 36.03 kg/m  07/20/18 36.21 kg/m  07/13/18 36.80 kg/m   Wt Readings from Last 4 Encounters:  08/18/18 197 lb (89.4 kg)  08/03/18 197 lb (89.4 kg)  07/20/18 198 lb (89.8 kg)  07/04/18 201 lb 3.2 oz (91.3 kg)  Psych/Mental status: Alert, oriented x 3 (person, place, & time)       Eyes: PERLA Respiratory: No evidence of acute  respiratory distress  Cervical Spine Exam  Inspection: No masses, redness, or swelling Alignment: Symmetrical Functional ROM: Unrestricted ROM      Stability: No instability detected Muscle strength & Tone: Functionally intact Sensory: Unimpaired Palpation: No palpable anomalies              Upper Extremity (UE) Exam    Side: Right upper extremity  Side: Left upper extremity  Inspection: No masses, redness, swelling, or asymmetry. No contractures  Inspection: No masses, redness, swelling, or asymmetry. No contractures  Functional ROM: Unrestricted ROM          Functional ROM: Unrestricted ROM          Muscle strength & Tone: Functionally intact  Muscle strength & Tone: Functionally intact  Sensory: Unimpaired  Sensory: Unimpaired  Palpation: No palpable anomalies              Palpation: No palpable anomalies  Specialized Test(s): Deferred         Specialized Test(s): Deferred          Thoracic Spine Exam  Inspection: No masses, redness, or swelling Alignment: Symmetrical Functional ROM: Unrestricted ROM Stability: No instability detected Sensory: Unimpaired Muscle strength & Tone: No palpable anomalies  Lumbar Spine Exam  Inspection: No masses, redness, or swelling Alignment: Symmetrical Functional ROM: Unrestricted ROM      Stability: No instability detected Muscle strength & Tone: Functionally intact Sensory: Unimpaired Palpation: No palpable anomalies       Provocative Tests: Lumbar Hyperextension and rotation test: evaluation deferred today       Patrick's Maneuver: evaluation deferred today                    Gait & Posture Assessment  Ambulation: Unassisted Gait: Relatively normal for age and body habitus Posture: WNL   Lower Extremity Exam    Side: Right lower extremity  Side: Left lower extremity  Inspection: No masses, redness, swelling, or asymmetry. No contractures  Inspection: No masses, redness, swelling, or asymmetry. No contractures   Functional ROM: Unrestricted ROM          Functional ROM: Unrestricted ROM          Muscle strength & Tone: Functionally intact  Muscle strength & Tone: Functionally intact  Sensory: Unimpaired  Sensory: Unimpaired  Palpation: No palpable anomalies  Palpation: No palpable anomalies   Assessment  Primary Diagnosis & Pertinent Problem List: Diagnoses of Rib pain on right side, Incisional pain (Secondary Area of Pain), Chronic pain syndrome, Long term current use of opiate analgesic, Pharmacologic therapy, Disorder of skeletal system, and Problems influencing health status were pertinent to this visit.  Visit Diagnosis: 1. Rib pain on right side   2. Incisional pain (Secondary Area of Pain)   3. Chronic pain syndrome   4. Long term current use of opiate analgesic   5. Pharmacologic therapy   6. Disorder of skeletal system   7. Problems influencing health status    Plan of Care  Initial treatment plan:  Please be advised that as per protocol, today's visit has been an evaluation only. We have not taken over the patient's controlled substance management.  Problem-specific plan: No problem-specific Assessment & Plan notes found for this encounter.  Ordered Lab-work, Procedure(s), Referral(s), & Consult(s): Orders Placed This Encounter  Procedures  . Compliance Drug Analysis, Ur  . Magnesium  . Vitamin B12  . Sedimentation rate  . 25-Hydroxyvitamin D Lcms D2+D3  . C-reactive protein   Pharmacotherapy: Medications ordered:  No orders of the defined types were placed in this encounter.  Medications administered during this visit: Nalanie C. Runnion had no medications administered during this visit.   Pharmacotherapy under consideration:  Opioid Analgesics: The patient was informed that there is no guarantee that she would be a candidate for opioid analgesics. The decision will be made following CDC guidelines. This decision will be based on the results of diagnostic studies, as well as  Susan Houston's risk profile.  Membrane stabilizer: To be determined at a later time Muscle relaxant: To be determined at a later time NSAID: To be determined at a later time Other analgesic(s): To be determined at a later time   Interventional therapies under consideration: Susan Houston was informed that there is no guarantee that she would be a candidate for interventional therapies. The decision will be based on the results of diagnostic studies, as well as Susan Houston's risk  profile.  Possible procedure(s): Diagnostic right intercostal nerve block Possible right intercostal radiofrequency ablation   Provider-requested follow-up: Return for 2nd Visit, w/ Dr. Dossie Arbour .  Future Appointments  Date Time Provider [place]  08/29/2018 11:30 AM Milinda Pointer, MD [facility] None  09/05/2018  2:00 PM Cammie Sickle, MD [facility] None  09/06/2018 11:00 AM Valerie Roys, DO [facility]    Primary Care Physician: Valerie Roys, DO Location: [facility] Note by:  Date: 08/18/2018; Time: 4:15 PM  Pain Score Disclaimer: We use the NRS-11 scale. This is a self-reported, subjective measurement of pain severity with only modest accuracy. It is used primarily to identify changes within a particular patient. It must be understood that outpatient pain scales are significantly less accurate that those used for research, where they can be applied under ideal controlled circumstances with minimal exposure to variables. In reality, the score is likely to be a combination of pain intensity and pain affect, where pain affect describes the degree of emotional arousal or changes in action readiness caused by the sensory experience of pain. Factors such as social and work situation, setting, emotional state, anxiety levels, expectation, and prior pain experience may influence pain perception and show large inter-individual differences that may also be affected by time  variables.  Patient instructions provided during this appointment: Patient Instructions   ____________________________________________________________________________________________  Appointment Policy Summary  It is our goal and responsibility to provide the medical community with assistance in the evaluation and management of patients with chronic pain. Unfortunately our resources are limited. Because we do not have an unlimited amount of time, or available appointments, we are required to closely monitor and manage their use. The following rules exist to maximize their use:  Patient's responsibilities: 1. Punctuality:  At what time should I arrive? You should be physically present in our office 30 minutes before your scheduled appointment. Your scheduled appointment is with your assigned healthcare provider. However, it takes 5-10 minutes to be "checked-in", and another 15 minutes for the nurses to do the admission. If you arrive to our office at the time you were given for your appointment, you will end up being at least 20-25 minutes late to your appointment with the provider. 2. Tardiness:  What happens if I arrive only a few minutes after my scheduled appointment time? You will need to reschedule your appointment. The cutoff is your appointment time. This is why it is so important that you arrive at least 30 minutes before that appointment. If you have an appointment scheduled for 10:00 AM and you arrive at 10:01, you will be required to reschedule your appointment.  3. Plan ahead:  Always assume that you will encounter traffic on your way in. Plan for it. If you are dependent on a driver, make sure they understand these rules and the need to arrive early. 4. Other appointments and responsibilities:  Avoid scheduling any other appointments before or after your pain clinic appointments.  5. Be prepared:  Write down everything that you need to discuss with your healthcare provider and give  this information to the admitting nurse. Write down the medications that you will need refilled. Bring your pills and bottles (even the empty ones), to all of your appointments, except for those where a procedure is scheduled. 6. No children or pets:  Find someone to take care of them. It is not appropriate to bring them in. 7. Scheduling changes:  We request "advanced notification" of any changes  or cancellations. 8. Advanced notification:  Defined as a time period of more than 24 hours prior to the originally scheduled appointment. This allows for the appointment to be offered to other patients. 9. Rescheduling:  When a visit is rescheduled, it will require the cancellation of the original appointment. For this reason they both fall within the category of "Cancellations".  10. Cancellations:  They require advanced notification. Any cancellation less than 24 hours before the  appointment will be recorded as a "No Show". 11. No Show:  Defined as an unkept appointment where the patient failed to notify or declare to the practice their intention or inability to keep the appointment.  Corrective process for repeat offenders:  1. Tardiness: Three (3) episodes of rescheduling due to late arrivals will be recorded as one (1) "No Show". 2. Cancellation or reschedule: Three (3) cancellations or rescheduling will be recorded as one (1) "No Show". 3. "No Shows": Three (3) "No Shows" within a 12 month period will result in discharge from the practice. ____________________________________________________________________________________________  ____________________________________________________________________________________________  Pain Scale  Introduction: The pain score used by this practice is the Verbal Numerical Rating Scale (VNRS-11). This is an 11-point scale. It is for adults and children 10 years or older. There are significant differences in how the pain score is reported, used, and  applied. Forget everything you learned in the past and learn this scoring system.  General Information: The scale should reflect your current level of pain. Unless you are specifically asked for the level of your worst pain, or your average pain. If you are asked for one of these two, then it should be understood that it is over the past 24 hours.  Basic Activities of Daily Living (ADL): Personal hygiene, dressing, eating, transferring, and using restroom.  Instructions: Most patients tend to report their level of pain as a combination of two factors, their physical pain and their psychosocial pain. This last one is also known as "suffering" and it is reflection of how physical pain affects you socially and psychologically. From now on, report them separately. From this point on, when asked to report your pain level, report only your physical pain. Use the following table for reference.  Pain Clinic Pain Levels (0-5/10)  Pain Level Score  Description  No Pain 0   Mild pain 1 Nagging, annoying, but does not interfere with basic activities of daily living (ADL). Patients are able to eat, bathe, get dressed, toileting (being able to get on and off the toilet and perform personal hygiene functions), transfer (move in and out of bed or a chair without assistance), and maintain continence (able to control bladder and bowel functions). Blood pressure and heart rate are unaffected. A normal heart rate for a healthy adult ranges from 60 to 100 bpm (beats per minute).   Mild to moderate pain 2 Noticeable and distracting. Impossible to hide from other people. More frequent flare-ups. Still possible to adapt and function close to normal. It can be very annoying and may have occasional stronger flare-ups. With discipline, patients may get used to it and adapt.   Moderate pain 3 Interferes significantly with activities of daily living (ADL). It becomes difficult to feed, bathe, get dressed, get on and off the  toilet or to perform personal hygiene functions. Difficult to get in and out of bed or a chair without assistance. Very distracting. With effort, it can be ignored when deeply involved in activities.   Moderately severe pain 4 Impossible to ignore for  more than a few minutes. With effort, patients may still be able to manage work or participate in some social activities. Very difficult to concentrate. Signs of autonomic nervous system discharge are evident: dilated pupils (mydriasis); mild sweating (diaphoresis); sleep interference. Heart rate becomes elevated (>115 bpm). Diastolic blood pressure (lower number) rises above 100 mmHg. Patients find relief in laying down and not moving.   Severe pain 5 Intense and extremely unpleasant. Associated with frowning face and frequent crying. Pain overwhelms the senses.  Ability to do any activity or maintain social relationships becomes significantly limited. Conversation becomes difficult. Pacing back and forth is common, as getting into a comfortable position is nearly impossible. Pain wakes you up from deep sleep. Physical signs will be obvious: pupillary dilation; increased sweating; goosebumps; brisk reflexes; cold, clammy hands and feet; nausea, vomiting or dry heaves; loss of appetite; significant sleep disturbance with inability to fall asleep or to remain asleep. When persistent, significant weight loss is observed due to the complete loss of appetite and sleep deprivation.  Blood pressure and heart rate becomes significantly elevated. Caution: If elevated blood pressure triggers a pounding headache associated with blurred vision, then the patient should immediately seek attention at an urgent or emergency care unit, as these may be signs of an impending stroke.    Emergency Department Pain Levels (6-10/10)  Emergency Room Pain 6 Severely limiting. Requires emergency care and should not be seen or managed at an outpatient pain management facility.  Communication becomes difficult and requires great effort. Assistance to reach the emergency department may be required. Facial flushing and profuse sweating along with potentially dangerous increases in heart rate and blood pressure will be evident.   Distressing pain 7 Self-care is very difficult. Assistance is required to transport, or use restroom. Assistance to reach the emergency department will be required. Tasks requiring coordination, such as bathing and getting dressed become very difficult.   Disabling pain 8 Self-care is no longer possible. At this level, pain is disabling. The individual is unable to do even the most "basic" activities such as walking, eating, bathing, dressing, transferring to a bed, or toileting. Fine motor skills are lost. It is difficult to think clearly.   Incapacitating pain 9 Pain becomes incapacitating. Thought processing is no longer possible. Difficult to remember your own name. Control of movement and coordination are lost.   The worst pain imaginable 10 At this level, most patients pass out from pain. When this level is reached, collapse of the autonomic nervous system occurs, leading to a sudden drop in blood pressure and heart rate. This in turn results in a temporary and dramatic drop in blood flow to the brain, leading to a loss of consciousness. Fainting is one of the body's self defense mechanisms. Passing out puts the brain in a calmed state and causes it to shut down for a while, in order to begin the healing process.    Summary: 1. Refer to this scale when providing [place] with your pain level. 2. Be accurate and careful when reporting your pain level. This will help with your care. 3. Over-reporting your pain level will lead to loss of credibility. 4. Even a level of 1/10 means that there is pain and will be treated at our facility. 5. High, inaccurate reporting will be documented as "Symptom Exaggeration", leading to loss of credibility and suspicions  of possible secondary gains such as obtaining more narcotics, or wanting to appear disabled, for fraudulent reasons. 6. Only pain levels of  5 or below will be seen at our facility. 7. Pain levels of 6 and above will be sent to the Emergency Department and the appointment cancelled. ____________________________________________________________________________________________   BMI Assessment: Estimated body mass index is 36.03 kg/m as calculated from the following:   Height as of this encounter: 5' 2"  (1.575 m).   Weight as of this encounter: 197 lb (89.4 kg).  BMI interpretation table: BMI level Category Range association with higher incidence of chronic pain  <18 kg/m2 Underweight   18.5-24.9 kg/m2 Ideal body weight   25-29.9 kg/m2 Overweight Increased incidence by 20%  30-34.9 kg/m2 Obese (Class I) Increased incidence by 68%  35-39.9 kg/m2 Severe obesity (Class II) Increased incidence by 136%  >40 kg/m2 Extreme obesity (Class III) Increased incidence by 254%   BMI Readings from Last 4 Encounters:  08/18/18 36.03 kg/m  08/03/18 36.03 kg/m  07/20/18 36.21 kg/m  07/13/18 36.80 kg/m   Wt Readings from Last 4 Encounters:  08/18/18 197 lb (89.4 kg)  08/03/18 197 lb (89.4 kg)  07/20/18 198 lb (89.8 kg)  07/04/18 201 lb 3.2 oz (91.3 kg)   BMI Assessment: Estimated body mass index is 36.03 kg/m as calculated from the following:   Height as of 08/03/18: 5' 2"  (1.575 m).   Weight as of 08/03/18: 197 lb (89.4 kg).  BMI interpretation table: BMI level Category Range association with higher incidence of chronic pain  <18 kg/m2 Underweight   18.5-24.9 kg/m2 Ideal body weight   25-29.9 kg/m2 Overweight Increased incidence by 20%  30-34.9 kg/m2 Obese (Class I) Increased incidence by 68%  35-39.9 kg/m2 Severe obesity (Class II) Increased incidence by 136%  >40 kg/m2 Extreme obesity (Class III) Increased incidence by 254%      BMI Readings from Last 4 Encounters:  08/03/18 36.03  kg/m  07/20/18 36.21 kg/m  07/13/18 36.80 kg/m  07/04/18 36.80 kg/m      Wt Readings from Last 4 Encounters:  08/03/18 197 lb (89.4 kg)  07/20/18 198 lb (89.8 kg)  07/04/18 201 lb 3.2 oz (91.3 kg)  07/01/18 198 lb (89.8 kg)

## 2018-08-18 NOTE — Patient Instructions (Addendum)
____________________________________________________________________________________________  Appointment Policy Summary  It is our goal and responsibility to provide the medical community with assistance in the evaluation and management of patients with chronic pain. Unfortunately our resources are limited. Because we do not have an unlimited amount of time, or available appointments, we are required to closely monitor and manage their use. The following rules exist to maximize their use:  Patient's responsibilities: 1. Punctuality:  At what time should I arrive? You should be physically present in our office 30 minutes before your scheduled appointment. Your scheduled appointment is with your assigned healthcare provider. However, it takes 5-10 minutes to be "checked-in", and another 15 minutes for the nurses to do the admission. If you arrive to our office at the time you were given for your appointment, you will end up being at least 20-25 minutes late to your appointment with the provider. 2. Tardiness:  What happens if I arrive only a few minutes after my scheduled appointment time? You will need to reschedule your appointment. The cutoff is your appointment time. This is why it is so important that you arrive at least 30 minutes before that appointment. If you have an appointment scheduled for 10:00 AM and you arrive at 10:01, you will be required to reschedule your appointment.  3. Plan ahead:  Always assume that you will encounter traffic on your way in. Plan for it. If you are dependent on a driver, make sure they understand these rules and the need to arrive early. 4. Other appointments and responsibilities:  Avoid scheduling any other appointments before or after your pain clinic appointments.  5. Be prepared:  Write down everything that you need to discuss with your healthcare provider and give this information to the admitting nurse. Write down the medications that you will need  refilled. Bring your pills and bottles (even the empty ones), to all of your appointments, except for those where a procedure is scheduled. 6. No children or pets:  Find someone to take care of them. It is not appropriate to bring them in. 7. Scheduling changes:  We request "advanced notification" of any changes or cancellations. 8. Advanced notification:  Defined as a time period of more than 24 hours prior to the originally scheduled appointment. This allows for the appointment to be offered to other patients. 9. Rescheduling:  When a visit is rescheduled, it will require the cancellation of the original appointment. For this reason they both fall within the category of "Cancellations".  10. Cancellations:  They require advanced notification. Any cancellation less than 24 hours before the  appointment will be recorded as a "No Show". 11. No Show:  Defined as an unkept appointment where the patient failed to notify or declare to the practice their intention or inability to keep the appointment.  Corrective process for repeat offenders:  1. Tardiness: Three (3) episodes of rescheduling due to late arrivals will be recorded as one (1) "No Show". 2. Cancellation or reschedule: Three (3) cancellations or rescheduling will be recorded as one (1) "No Show". 3. "No Shows": Three (3) "No Shows" within a 12 month period will result in discharge from the practice. ____________________________________________________________________________________________  ____________________________________________________________________________________________  Pain Scale  Introduction: The pain score used by this practice is the Verbal Numerical Rating Scale (VNRS-11). This is an 11-point scale. It is for adults and children 10 years or older. There are significant differences in how the pain score is reported, used, and applied. Forget everything you learned in the past and learn  this scoring system.  General  Information: The scale should reflect your current level of pain. Unless you are specifically asked for the level of your worst pain, or your average pain. If you are asked for one of these two, then it should be understood that it is over the past 24 hours.  Basic Activities of Daily Living (ADL): Personal hygiene, dressing, eating, transferring, and using restroom.  Instructions: Most patients tend to report their level of pain as a combination of two factors, their physical pain and their psychosocial pain. This last one is also known as "suffering" and it is reflection of how physical pain affects you socially and psychologically. From now on, report them separately. From this point on, when asked to report your pain level, report only your physical pain. Use the following table for reference.  Pain Clinic Pain Levels (0-5/10)  Pain Level Score  Description  No Pain 0   Mild pain 1 Nagging, annoying, but does not interfere with basic activities of daily living (ADL). Patients are able to eat, bathe, get dressed, toileting (being able to get on and off the toilet and perform personal hygiene functions), transfer (move in and out of bed or a chair without assistance), and maintain continence (able to control bladder and bowel functions). Blood pressure and heart rate are unaffected. A normal heart rate for a healthy adult ranges from 60 to 100 bpm (beats per minute).   Mild to moderate pain 2 Noticeable and distracting. Impossible to hide from other people. More frequent flare-ups. Still possible to adapt and function close to normal. It can be very annoying and may have occasional stronger flare-ups. With discipline, patients may get used to it and adapt.   Moderate pain 3 Interferes significantly with activities of daily living (ADL). It becomes difficult to feed, bathe, get dressed, get on and off the toilet or to perform personal hygiene functions. Difficult to get in and out of bed or a chair  without assistance. Very distracting. With effort, it can be ignored when deeply involved in activities.   Moderately severe pain 4 Impossible to ignore for more than a few minutes. With effort, patients may still be able to manage work or participate in some social activities. Very difficult to concentrate. Signs of autonomic nervous system discharge are evident: dilated pupils (mydriasis); mild sweating (diaphoresis); sleep interference. Heart rate becomes elevated (>115 bpm). Diastolic blood pressure (lower number) rises above 100 mmHg. Patients find relief in laying down and not moving.   Severe pain 5 Intense and extremely unpleasant. Associated with frowning face and frequent crying. Pain overwhelms the senses.  Ability to do any activity or maintain social relationships becomes significantly limited. Conversation becomes difficult. Pacing back and forth is common, as getting into a comfortable position is nearly impossible. Pain wakes you up from deep sleep. Physical signs will be obvious: pupillary dilation; increased sweating; goosebumps; brisk reflexes; cold, clammy hands and feet; nausea, vomiting or dry heaves; loss of appetite; significant sleep disturbance with inability to fall asleep or to remain asleep. When persistent, significant weight loss is observed due to the complete loss of appetite and sleep deprivation.  Blood pressure and heart rate becomes significantly elevated. Caution: If elevated blood pressure triggers a pounding headache associated with blurred vision, then the patient should immediately seek attention at an urgent or emergency care unit, as these may be signs of an impending stroke.    Emergency Department Pain Levels (6-10/10)  Emergency Room Pain 6 Severely   limiting. Requires emergency care and should not be seen or managed at an outpatient pain management facility. Communication becomes difficult and requires great effort. Assistance to reach the emergency department  may be required. Facial flushing and profuse sweating along with potentially dangerous increases in heart rate and blood pressure will be evident.   Distressing pain 7 Self-care is very difficult. Assistance is required to transport, or use restroom. Assistance to reach the emergency department will be required. Tasks requiring coordination, such as bathing and getting dressed become very difficult.   Disabling pain 8 Self-care is no longer possible. At this level, pain is disabling. The individual is unable to do even the most "basic" activities such as walking, eating, bathing, dressing, transferring to a bed, or toileting. Fine motor skills are lost. It is difficult to think clearly.   Incapacitating pain 9 Pain becomes incapacitating. Thought processing is no longer possible. Difficult to remember your own name. Control of movement and coordination are lost.   The worst pain imaginable 10 At this level, most patients pass out from pain. When this level is reached, collapse of the autonomic nervous system occurs, leading to a sudden drop in blood pressure and heart rate. This in turn results in a temporary and dramatic drop in blood flow to the brain, leading to a loss of consciousness. Fainting is one of the body's self defense mechanisms. Passing out puts the brain in a calmed state and causes it to shut down for a while, in order to begin the healing process.    Summary: 1. Refer to this scale when providing [place] with your pain level. 2. Be accurate and careful when reporting your pain level. This will help with your care. 3. Over-reporting your pain level will lead to loss of credibility. 4. Even a level of 1/10 means that there is pain and will be treated at our facility. 5. High, inaccurate reporting will be documented as "Symptom Exaggeration", leading to loss of credibility and suspicions of possible secondary gains such as obtaining more narcotics, or wanting to appear disabled, for  fraudulent reasons. 6. Only pain levels of 5 or below will be seen at our facility. 7. Pain levels of 6 and above will be sent to the Emergency Department and the appointment cancelled. ____________________________________________________________________________________________   BMI Assessment: Estimated body mass index is 36.03 kg/m as calculated from the following:   Height as of this encounter: 5\' 2"  (1.575 m).   Weight as of this encounter: 197 lb (89.4 kg).  BMI interpretation table: BMI level Category Range association with higher incidence of chronic pain  <18 kg/m2 Underweight   18.5-24.9 kg/m2 Ideal body weight   25-29.9 kg/m2 Overweight Increased incidence by 20%  30-34.9 kg/m2 Obese (Class I) Increased incidence by 68%  35-39.9 kg/m2 Severe obesity (Class II) Increased incidence by 136%  >40 kg/m2 Extreme obesity (Class III) Increased incidence by 254%   BMI Readings from Last 4 Encounters:  08/18/18 36.03 kg/m  08/03/18 36.03 kg/m  07/20/18 36.21 kg/m  07/13/18 36.80 kg/m   Wt Readings from Last 4 Encounters:  08/18/18 197 lb (89.4 kg)  08/03/18 197 lb (89.4 kg)  07/20/18 198 lb (89.8 kg)  07/04/18 201 lb 3.2 oz (91.3 kg)   BMI Assessment: Estimated body mass index is 36.03 kg/m as calculated from the following:   Height as of 08/03/18: 5\' 2"  (1.575 m).   Weight as of 08/03/18: 197 lb (89.4 kg).  BMI interpretation table: BMI level Category Range association  with higher incidence of chronic pain  <18 kg/m2 Underweight   18.5-24.9 kg/m2 Ideal body weight   25-29.9 kg/m2 Overweight Increased incidence by 20%  30-34.9 kg/m2 Obese (Class I) Increased incidence by 68%  35-39.9 kg/m2 Severe obesity (Class II) Increased incidence by 136%  >40 kg/m2 Extreme obesity (Class III) Increased incidence by 254%      BMI Readings from Last 4 Encounters:  08/03/18 36.03 kg/m  07/20/18 36.21 kg/m  07/13/18 36.80 kg/m  07/04/18 36.80 kg/m      Wt Readings  from Last 4 Encounters:  08/03/18 197 lb (89.4 kg)  07/20/18 198 lb (89.8 kg)  07/04/18 201 lb 3.2 oz (91.3 kg)  07/01/18 198 lb (89.8 kg)

## 2018-08-18 NOTE — Telephone Encounter (Signed)
OK to have her come in Tuesday and get it here. Order in.

## 2018-08-22 LAB — MAGNESIUM: MAGNESIUM: 2.1 mg/dL (ref 1.6–2.3)

## 2018-08-22 LAB — 25-HYDROXY VITAMIN D LCMS D2+D3
25-Hydroxy, Vitamin D-2: 2.1 ng/mL
25-Hydroxy, Vitamin D-3: 23 ng/mL
25-Hydroxy, Vitamin D: 25 ng/mL — ABNORMAL LOW

## 2018-08-22 LAB — SEDIMENTATION RATE: Sed Rate: 45 mm/hr — ABNORMAL HIGH (ref 0–40)

## 2018-08-22 LAB — VITAMIN B12: Vitamin B-12: 405 pg/mL (ref 232–1245)

## 2018-08-22 LAB — C-REACTIVE PROTEIN: CRP: 6 mg/L (ref 0–10)

## 2018-08-23 DIAGNOSIS — C3490 Malignant neoplasm of unspecified part of unspecified bronchus or lung: Secondary | ICD-10-CM | POA: Diagnosis not present

## 2018-08-23 DIAGNOSIS — Z96659 Presence of unspecified artificial knee joint: Secondary | ICD-10-CM | POA: Diagnosis not present

## 2018-08-23 LAB — COMPLIANCE DRUG ANALYSIS, UR

## 2018-08-25 ENCOUNTER — Ambulatory Visit (INDEPENDENT_AMBULATORY_CARE_PROVIDER_SITE_OTHER): Payer: Medicare HMO

## 2018-08-25 ENCOUNTER — Other Ambulatory Visit: Payer: Self-pay | Admitting: Family Medicine

## 2018-08-25 DIAGNOSIS — C3431 Malignant neoplasm of lower lobe, right bronchus or lung: Secondary | ICD-10-CM

## 2018-08-25 DIAGNOSIS — Z23 Encounter for immunization: Secondary | ICD-10-CM | POA: Diagnosis not present

## 2018-08-25 NOTE — Progress Notes (Signed)
[id number]

## 2018-08-28 DIAGNOSIS — C3490 Malignant neoplasm of unspecified part of unspecified bronchus or lung: Secondary | ICD-10-CM | POA: Diagnosis not present

## 2018-08-28 DIAGNOSIS — Z96659 Presence of unspecified artificial knee joint: Secondary | ICD-10-CM | POA: Diagnosis not present

## 2018-08-28 NOTE — Progress Notes (Addendum)
Patient's Name: Susan Houston  MRN: [id number]  Referring Provider: Valerie Roys, DO  DOB: 10/12/1961  PCP: Valerie Roys, DO  DOS: 08/29/2018  Note by: Gaspar Cola, MD  Service setting: Ambulatory outpatient  Specialty: Interventional Pain Management  Location: [facility] (AMB) Pain Management Facility    Patient type: Established   Primary Reason(s) for Visit: Encounter for evaluation before starting new chronic pain management plan of care (Level of risk: moderate) CC: Pain (rib that radiates from back to front)  HPI  Susan Houston is a 57 y.o. year old, female patient, who comes today for a follow-up evaluation to review the test results and decide on a treatment plan. She has Saphenous neuralgia; DDD (degenerative disc disease), lumbosacral; Sacroiliac joint dysfunction; DDD (degenerative disc disease), lumbar; Lumbar facet syndrome; Severe depression ([place]); Chronic anxiety; Coarse tremors; Hyperlipidemia; COPD (chronic obstructive pulmonary disease) ([place]); Migraine without aura and without status migrainosus, not intractable; Frequent falls; Mild cognitive impairment; Vitamin B12 deficiency; Laryngeal cancer ([place]); Mass of lower lobe of right lung; Lung cancer ([place]); Primary cancer of right lower lobe of lung ([place]); Controlled substance agreement signed; Rib pain (Primary Area of Pain) (Right); Incisional pain (Secondary Area of Pain); Chronic pain syndrome; Long term current use of opiate analgesic; Pharmacologic therapy; Disorder of skeletal system; Problems influencing health status; Post-thoracotomy pain syndrome; Chronic post-thoracotomy pain; Neurogenic pain; and Vitamin D insufficiency on their problem list. Her primarily concern today is the Pain (rib that radiates from back to front)  Pain Assessment: Location: Mid Back Radiating: right breast and rib cage Onset: More than a month ago Duration: Chronic pain Quality: Aching, Shooting, Sharp, Throbbing, Sore, Tender Severity: 6 /10  (subjective, self-reported pain score)  Note: Reported level is compatible with observation.                         When using our objective Pain Scale, levels between 6 and 10/10 are said to belong in an emergency room, as it progressively worsens from a 6/10, described as severely limiting, requiring emergency care not usually available at an outpatient pain management facility. At a 6/10 level, communication becomes difficult and requires great effort. Assistance to reach the emergency department may be required. Facial flushing and profuse sweating along with potentially dangerous increases in heart rate and blood pressure will be evident. Timing: Constant Modifying factors: medications BP: (!) 145/84  HR: (!) 57  Susan Houston comes in today for a follow-up visit after her initial evaluation on 08/18/2018. Today we went over the results of her tests. These were explained in "Layman's terms". During today's appointment we went over my diagnostic impression, as well as the proposed treatment plan.  She admits that she is having right side pain above and below her breast.  She was recently diagnosed with lung cancer July 29th, 2019 and underwent surgery. She had a right partial lung lobectomy she admits that she was not having any pain prior to the surgery. She now she is having sharp and gnawing throbbing type pain that is uncomfortable. She admits that when she raises her arm there is a pulling type pain. She denies any interventional therapy or physical therapy. She has had a CT scan is also had rib x-rays. She will have a repeat CT scan on November 18.  Is a previous patient of Dr. Mohammed Kindle. She admits that she does not have any other areas of pain.  In considering the treatment plan  options, Susan Houston was reminded that I no longer take patients for medication management only. I asked her to let me know if she had no intention of taking advantage of the interventional therapies, so that we could  make arrangements to provide this space to someone interested. I also made it clear that undergoing interventional therapies for the purpose of getting pain medications is very inappropriate on the part of a patient, and it will not be tolerated in this practice. This type of behavior would suggest true addiction and therefore it requires referral to an addiction specialist.   Further details on both, my assessment(s), as well as the proposed treatment plan, please see below.  Controlled Substance Pharmacotherapy Assessment REMS (Risk Evaluation and Mitigation Strategy)  Analgesic: Xtampza Houston 9 mg twice daily this fill date 08/08/2018) Susan Houston 18 mg/day) (Xtampza = Abuse deterrent Oxycodone) (last prescribed 10 07/13/2018 and filled on 08/08/2018 #46) Highest recorded MME/day: 83m/day MME/day: 27 mg/day Pill Count: None expected due to no prior prescriptions written by our practice. SHart Rochester RN  08/29/2018 11:42 AM  Sign at close encounter Safety precautions to be maintained throughout the outpatient stay will include: orient to surroundings, keep bed in low position, maintain call bell within reach at all times, provide assistance with transfer out of bed and ambulation.    Pharmacokinetics: Liberation and absorption (onset of action): WNL Distribution (time to peak effect): WNL Metabolism and excretion (duration of action): WNL         Pharmacodynamics: Desired effects: Analgesia: Ms. WYandellreports >50% benefit. Functional ability: Patient reports that medication allows her to accomplish basic ADLs Clinically meaningful improvement in function (CMIF): Sustained CMIF goals met Perceived effectiveness: Described as relatively effective, allowing for increase in activities of daily living (ADL) Undesirable effects: Side-effects or Adverse reactions: None reported Monitoring: [place] PMP: Online review of the past 11-montheriod previously conducted. Not applicable at this point  since we have not taken over the patient's medication management yet. List of other Serum/Urine Drug Screening Test(s):  No results found. List of all UDS test(s) done:  Lab Results  Component Value Date   TOXASSSELUR FINAL 06/24/2016   TOXASSSELUR FINAL 03/25/2016   [place]INAL 01/30/2016   SUMMARY FINAL 08/18/2018   Last UDS on record: Tox[employer] Select 13  Date Value Ref Range Status  06/24/2016 FINAL  Final    Comment:    ==================================================================== TOXASSURE SELECT 13 (MW) ==================================================================== Test                             Result       Flag       Units Drug Present and Declared for Prescription Verification   Lorazepam                      1242         EXPECTED   ng/mg creat    Source of lorazepam is a scheduled prescription medication.   Tapentadol                     >[id number]     EXPECTED   ng/mg creat    Source of tapentadol is a scheduled prescription medication. ==================================================================== Test                      Result    Flag   Units  Ref Range   Creatinine              73               mg/dL      >=20 ==================================================================== Declared Medications:  The flagging and interpretation on this report are based on the  following declared medications.  Unexpected results may arise from  inaccuracies in the declared medications.  **Note: The testing scope of this panel includes these medications:  Lorazepam (Ativan)  Tapentadol (Nucynta)?  **Note: The testing scope of this panel does not include following  reported medications:  Acetaminophen  Albuterol (Duoneb)  Aspirin (Aspirin 81)  Bupropion (Wellbutrin)  Buspirone (BuSpar)  Caffeine  Cefuroxime axetil (Ceftin)  Doxepin (Sinequan)  Fluticasone (Advair)  Fluticasone (Flonase)  Gabapentin (Neurontin)  Ipratropium (Duoneb)  Iron  (Ferrous Sulfate)  Polymyxin (PolyTrim)  Risperidone (Risperdal)  Salmeterol (Advair)  Sertraline (Zoloft)  Tizanidine (Zanaflex)  Topiramate (Topamax)  Trazodone (Desyrel)  Trimethoprim (PolyTrim)  Varenicline (Chantix) ==================================================================== For clinical consultation, please call [phone]. ====================================================================    Summary  Date Value Ref Range Status  08/18/2018 FINAL  Final    Comment:    ==================================================================== TOXASSURE COMP DRUG ANALYSIS,UR ==================================================================== Test                             Result       Flag       Units Drug Present and Declared for Prescription Verification   Oxycodone                      2229         EXPECTED   ng/mg creat   Oxymorphone                    557          EXPECTED   ng/mg creat   Noroxycodone                   3030         EXPECTED   ng/mg creat   Noroxymorphone                 286          EXPECTED   ng/mg creat    Sources of oxycodone are scheduled prescription medications.    Oxymorphone, noroxycodone, and noroxymorphone are expected    metabolites of oxycodone. Oxymorphone is also available as a    scheduled prescription medication.   Gabapentin                     PRESENT      EXPECTED   Tizanidine                     PRESENT      EXPECTED   Trazodone                      PRESENT      EXPECTED   1,3 chlorophenyl piperazine    PRESENT      EXPECTED    1,3-chlorophenyl piperazine is an expected metabolite of    trazodone. Drug Present not Declared for Prescription Verification   Acetaminophen                  PRESENT      UNEXPECTED   Ibuprofen  PRESENT      UNEXPECTED   Diphenhydramine                PRESENT      UNEXPECTED Drug Absent but Declared for Prescription Verification   Salicylate                     Not  Detected UNEXPECTED    Aspirin, as indicated in the declared medication list, is not    always detected even when used as directed. ==================================================================== Test                      Result    Flag   Units      Ref Range   Creatinine              56               mg/dL      >=20 ==================================================================== Declared Medications:  The flagging and interpretation on this report are based on the  following declared medications.  Unexpected results may arise from  inaccuracies in the declared medications.  **Note: The testing scope of this panel includes these medications:  Gabapentin  Oxycodone  Trazodone (Desyrel)  **Note: The testing scope of this panel does not include small to  moderate amounts of these reported medications:  Aspirin (Aspirin 81)  Tizanidine (Zanaflex)  **Note: The testing scope of this panel does not include following  reported medications:  Albuterol ==================================================================== For clinical consultation, please call [phone]. ====================================================================    UDS interpretation: No unexpected findings.          Medication Assessment Form: Patient introduced to form today Treatment compliance: Treatment may start today if patient agrees with proposed plan. Evaluation of compliance is not applicable at this point Risk Assessment Profile: Aberrant behavior: See initial evaluations. None observed or detected today Comorbid factors increasing risk of overdose: See initial evaluation. No additional risks detected today Opioid risk tool (ORT) (Total Score): 1 Personal History of Substance Abuse (SUD-Substance use disorder):  Alcohol: Negative  Illegal Drugs: Negative  Rx Drugs: Negative  ORT Risk Level calculation: Low Risk Risk of substance use disorder (SUD): Low Opioid Risk Tool - 08/29/18 1143       Family History of Substance Abuse   Alcohol  Positive Female    Illegal Drugs  Negative    Rx Drugs  Negative      Personal History of Substance Abuse   Alcohol  Negative    Illegal Drugs  Negative    Rx Drugs  Negative      Age   Age between 45-45 years   No      History of Preadolescent Sexual Abuse   History of Preadolescent Sexual Abuse  Negative or Female      Psychological Disease   Psychological Disease  Negative      Total Score   Opioid Risk Tool Scoring  1    Opioid Risk Interpretation  Low Risk      ORT Scoring interpretation table:  Score <3 = Low Risk for SUD  Score between 4-7 = Moderate Risk for SUD  Score >8 = High Risk for Opioid Abuse   Risk Mitigation Strategies:  Patient opioid safety counseling: Completed today. Counseling provided to patient as per "Patient Counseling Document". Document signed by patient, attesting to counseling and understanding Patient-Prescriber Agreement (PPA): Obtained today.  Controlled substance notification to other providers: Written and sent today.  Pharmacologic Plan: Today we may be taking over the patient's pharmacological regimen. See below.             Laboratory Chemistry  Inflammation Markers (CRP: Acute Phase) (ESR: Chronic Phase) Lab Results  Component Value Date   CRP 6 08/18/2018   ESRSEDRATE 45 (H) 08/18/2018                         Rheumatology Markers No results found.  Renal Function Markers Lab Results  Component Value Date   BUN 16 07/20/2018   CREATININE 0.81 07/20/2018   BCR 9 12/03/2017   GFRAA >60 07/20/2018   GFRNONAA >60 07/20/2018                             Hepatic Function Markers Lab Results  Component Value Date   AST 16 07/20/2018   ALT 11 07/20/2018   ALBUMIN 3.6 07/20/2018   ALKPHOS 87 07/20/2018   AMYLASE 24 (L) 12/03/2017   LIPASE 27 12/06/2017                        Electrolytes Lab Results  Component Value Date   NA 137 07/20/2018   K 4.5 07/20/2018   CL  105 07/20/2018   CALCIUM 8.9 07/20/2018   MG 2.1 08/18/2018                        Neuropathy Markers Lab Results  Component Value Date   VITAMINB12 405 08/18/2018                        CNS Tests No results found.  Bone Pathology Markers Lab Results  Component Value Date   25OHVITD1 25 (L) 08/18/2018   25OHVITD2 2.1 08/18/2018   25OHVITD3 23 08/18/2018                         Coagulation Parameters Lab Results  Component Value Date   INR 1.13 05/12/2018   LABPROT 14.4 05/12/2018   APTT 32 05/12/2018   PLT 249 07/20/2018                        Cardiovascular Markers Lab Results  Component Value Date   TROPONINI <0.03 03/02/2018   HGB 10.7 (L) 07/20/2018   HCT 31.8 (L) 07/20/2018                         CA Markers No results found.  Note: Lab results reviewed.  Recent Diagnostic Imaging Review  Elbow Imaging: Elbow-R DG Complete:  Results for orders placed during the hospital encounter of 08/28/17  DG Elbow Complete Right   Narrative CLINICAL DATA:  Injury.  Laceration.  EXAM: RIGHT ELBOW - COMPLETE 3+ VIEW  COMPARISON:  None.  FINDINGS: No acute fracture. No dislocation. Soft tissue injury posterior to the proximal forearm is noted.  IMPRESSION: No acute bony pathology.   Electronically Signed   By: Marybelle Killings M.D.   On: 08/28/2017 12:32    Complexity Note: Imaging results reviewed. Results shared with Ms. Alveta Heimlich, using Layman's terms.                         Meds   Current Outpatient Medications:  .  albuterol (PROVENTIL HFA;VENTOLIN HFA) 108 (90 Base) MCG/ACT inhaler, Inhale 2 puffs into the lungs every 4 (four) hours as needed for wheezing or shortness of breath., Disp: 1 Inhaler, Rfl: 2 .  aspirin 81 MG tablet, Take 81 mg by mouth daily., Disp: , Rfl:  .  gabapentin (NEURONTIN) 300 MG capsule, Take 1-3 capsules (300-900 mg total) by mouth 4 (four) times daily. Follow the written titration instructions., Disp: 360 capsule, Rfl: 2 .   tiZANidine (ZANAFLEX) 4 MG tablet, Take 2 mg by mouth 3 (three) times daily., Disp: , Rfl:  .  traZODone (DESYREL) 100 MG tablet, Take 100 mg by mouth at bedtime. , Disp: , Rfl:  .  Calcium Carbonate-Vit D-Min (GNP CALCIUM 1200) 1200-1000 MG-UNIT CHEW, Chew 1,200 mg by mouth daily with breakfast. Take in combination with vitamin D and magnesium., Disp: 30 tablet, Rfl: 5 .  Cholecalciferol (VITAMIN D3) 125 MCG (5000 UT) CAPS, Take 1 capsule (5,000 Units total) by mouth daily with breakfast. Take along with calcium and magnesium., Disp: 30 capsule, Rfl: 5 .  ergocalciferol (VITAMIN D2) 1.25 MG (50000 UT) capsule, Take 1 capsule (50,000 Units total) by mouth 2 (two) times a week. X 6 weeks., Disp: 12 capsule, Rfl: 0 .  Magnesium 500 MG CAPS, Take 1 capsule (500 mg total) by mouth 2 (two) times daily at 8 am and 10 pm., Disp: 60 capsule, Rfl: 5 No current facility-administered medications for this visit.   Facility-Administered Medications Ordered in Other Visits:  .  ipratropium-albuterol (DUONEB) 0.5-2.5 (3) MG/3ML nebulizer solution 3 mL, 3 mL, Nebulization, Q6H, Burns, Jennifer E, NP  ROS  Constitutional: Denies any fever or chills Gastrointestinal: No reported hemesis, hematochezia, vomiting, or acute GI distress Musculoskeletal: Denies any acute onset joint swelling, redness, loss of ROM, or weakness Neurological: No reported episodes of acute onset apraxia, aphasia, dysarthria, agnosia, amnesia, paralysis, loss of coordination, or loss of consciousness  Allergies  Ms. Pant is allergic to morphine and related; adhesive [tape]; and lexapro [escitalopram oxalate].  [place]  Drug: Ms. Whiteman  reports that she does not use drugs. Alcohol:  reports that she does not drink alcohol. Tobacco:  reports that she quit smoking about 2 years ago. Her smoking use included cigarettes. She smoked 0.50 packs per day. She has never used smokeless tobacco. Medical:  has a past medical history of Anemia, Anxiety,  Arthritis, Asthma, Back pain, Bruises easily, Cancer ([place]) (2009), Chronic leg pain, COPD (chronic obstructive pulmonary disease) ([place]), Depression, Difficulty sleeping, Diverticulitis, Dysrhythmia (04/2018), Facet syndrome, lumbar (03/28/2015), Hemorrhoids, History of kidney stones (04/2018), Hyperlipidemia, Hypertension (2019), Incontinence of urine, Laryngeal cancer ([place]) (2009), Lung cancer ([place]) (04/2018), Migraine, Migraine without aura and without status migrainosus, not intractable (09/11/2016), Mild cognitive impairment (11/23/2017), MRSA infection greater than 3 months ago, Nerve damage, Nodule of lower lobe of right lung (04/22/2018), Oxygen deficiency, and Wears dentures. Surgical: Ms. Frisinger  has a past surgical history that includes Bladder suspension (2014); Tonsillectomy; Excision neuroma (Bilateral, 09/24/2014); Esophagogastroduodenoscopy (2015); Colonoscopy; Esophagogastroduodenoscopy (egd) with propofol (N/A, 11/20/2016); Joint replacement (2009/2010); Abdominal hysterectomy (1983); Eye surgery (Left, 05/11/2018); Cholecystectomy (1998); Cataract extraction w/PHACO (Left, 05/11/2018); Thoracotomy/lobectomy (Right, 05/16/2018); Video bronchoscopy (N/A, 05/16/2018); and Cataract extraction w/PHACO (Right, 08/11/2018). Family: family history includes Alcohol abuse in her brother; Alzheimer's disease in her paternal grandfather; Anxiety disorder in her sister; Arthritis in her father; Asthma in her mother; Breast cancer (age of onset: 30) in her maternal grandmother; Cancer in her father and maternal grandfather; Cirrhosis in her mother; Depression  in her sister; Diabetes in her mother and sister; Gout in her sister; Heart disease in her brother; Hyperlipidemia in her father and mother; Hypertension in her father, mother, and sister; Kidney Stones in her son; Pneumonia in her paternal grandmother; Stroke in her maternal grandmother.  Constitutional Exam  General appearance: Well nourished, well developed, and  well hydrated. In no apparent acute distress Vitals:   08/29/18 1139  BP: (!) 145/84  Pulse: (!) 57  Resp: 18  Temp: 98.2 F (36.8 C)  TempSrc: Oral  SpO2: 99%  Weight: 197 lb (89.4 kg)  Height: 5' 2"  (1.575 m)   BMI Assessment: Estimated body mass index is 36.03 kg/m as calculated from the following:   Height as of this encounter: 5' 2"  (1.575 m).   Weight as of this encounter: 197 lb (89.4 kg).  BMI interpretation table: BMI level Category Range association with higher incidence of chronic pain  <18 kg/m2 Underweight   18.5-24.9 kg/m2 Ideal body weight   25-29.9 kg/m2 Overweight Increased incidence by 20%  30-34.9 kg/m2 Obese (Class I) Increased incidence by 68%  35-39.9 kg/m2 Severe obesity (Class II) Increased incidence by 136%  >40 kg/m2 Extreme obesity (Class III) Increased incidence by 254%   Patient's current BMI Ideal Body weight  Body mass index is 36.03 kg/m. Ideal body weight: 50.1 kg (110 lb 7.2 oz) Adjusted ideal body weight: 65.8 kg (145 lb 1.1 oz)   BMI Readings from Last 4 Encounters:  08/29/18 36.03 kg/m  08/18/18 36.03 kg/m  08/03/18 36.03 kg/m  07/20/18 36.21 kg/m   Wt Readings from Last 4 Encounters:  08/29/18 197 lb (89.4 kg)  08/18/18 197 lb (89.4 kg)  08/03/18 197 lb (89.4 kg)  07/20/18 198 lb (89.8 kg)  Psych/Mental status: Alert, oriented x 3 (person, place, & time)       Eyes: PERLA Respiratory: No evidence of acute respiratory distress  Cervical Spine Area Exam  Skin & Axial Inspection: No masses, redness, edema, swelling, or associated skin lesions Alignment: Symmetrical Functional ROM: Unrestricted ROM      Stability: No instability detected Muscle Tone/Strength: Functionally intact. No obvious neuro-muscular anomalies detected. Sensory (Neurological): Unimpaired Palpation: No palpable anomalies              Upper Extremity (UE) Exam    Side: Right upper extremity  Side: Left upper extremity  Skin & Extremity Inspection: Skin  color, temperature, and hair growth are WNL. No peripheral edema or cyanosis. No masses, redness, swelling, asymmetry, or associated skin lesions. No contractures.  Skin & Extremity Inspection: Skin color, temperature, and hair growth are WNL. No peripheral edema or cyanosis. No masses, redness, swelling, asymmetry, or associated skin lesions. No contractures.  Functional ROM: Unrestricted ROM          Functional ROM: Unrestricted ROM          Muscle Tone/Strength: Functionally intact. No obvious neuro-muscular anomalies detected.  Muscle Tone/Strength: Functionally intact. No obvious neuro-muscular anomalies detected.  Sensory (Neurological): Unimpaired          Sensory (Neurological): Unimpaired          Palpation: No palpable anomalies              Palpation: No palpable anomalies              Provocative Test(s):  Phalen's test: deferred Tinel's test: deferred Apley's scratch test (touch opposite shoulder):  Action 1 (Across chest): deferred Action 2 (Overhead): deferred Action 3 (LB reach): deferred  Provocative Test(s):  Phalen's test: deferred Tinel's test: deferred Apley's scratch test (touch opposite shoulder):  Action 1 (Across chest): deferred Action 2 (Overhead): deferred Action 3 (LB reach): deferred    Thoracic Spine Area Exam  Skin & Axial Inspection: No masses, redness, or swelling Alignment: Symmetrical Functional ROM: Unrestricted ROM Stability: No instability detected Muscle Tone/Strength: Functionally intact. No obvious neuro-muscular anomalies detected. Sensory (Neurological): Unimpaired Muscle strength & Tone: No palpable anomalies  Lumbar Spine Area Exam  Skin & Axial Inspection: No masses, redness, or swelling Alignment: Symmetrical Functional ROM: Unrestricted ROM       Stability: No instability detected Muscle Tone/Strength: Functionally intact. No obvious neuro-muscular anomalies detected. Sensory (Neurological): Unimpaired Palpation: No palpable  anomalies       Provocative Tests: Hyperextension/rotation test: deferred today       Lumbar quadrant test (Kemp's test): deferred today       Lateral bending test: deferred today       Patrick's Maneuver: deferred today                   FABER test: deferred today                   S-I anterior distraction/compression test: deferred today         S-I lateral compression test: deferred today         S-I Thigh-thrust test: deferred today         S-I Gaenslen's test: deferred today          Gait & Posture Assessment  Ambulation: Unassisted Gait: Relatively normal for age and body habitus Posture: WNL   Lower Extremity Exam    Side: Right lower extremity  Side: Left lower extremity  Stability: No instability observed          Stability: No instability observed          Skin & Extremity Inspection: Skin color, temperature, and hair growth are WNL. No peripheral edema or cyanosis. No masses, redness, swelling, asymmetry, or associated skin lesions. No contractures.  Skin & Extremity Inspection: Skin color, temperature, and hair growth are WNL. No peripheral edema or cyanosis. No masses, redness, swelling, asymmetry, or associated skin lesions. No contractures.  Functional ROM: Unrestricted ROM                  Functional ROM: Unrestricted ROM                  Muscle Tone/Strength: Functionally intact. No obvious neuro-muscular anomalies detected.  Muscle Tone/Strength: Functionally intact. No obvious neuro-muscular anomalies detected.  Sensory (Neurological): Unimpaired  Sensory (Neurological): Unimpaired  Palpation: No palpable anomalies  Palpation: No palpable anomalies   Assessment & Plan  Primary Diagnosis & Pertinent Problem List: The primary encounter diagnosis was Chronic pain syndrome. Diagnoses of Rib pain (Primary Area of Pain) (Right), Incisional pain (Secondary Area of Pain), Chronic post-thoracotomy pain, Post-thoracotomy pain syndrome, Primary cancer of right lower lobe of lung  ([place]), Laryngeal cancer ([place]), Disorder of skeletal system, Pharmacologic therapy, Long term current use of opiate analgesic, Problems influencing health status, Vitamin B12 deficiency, Neurogenic pain, and Vitamin D insufficiency were also pertinent to this visit.  Visit Diagnosis: 1. Chronic pain syndrome   2. Rib pain (Primary Area of Pain) (Right)   3. Incisional pain (Secondary Area of Pain)   4. Chronic post-thoracotomy pain   5. Post-thoracotomy pain syndrome   6. Primary cancer of right lower lobe of lung ([place])  7. Laryngeal cancer ([place])   8. Disorder of skeletal system   9. Pharmacologic therapy   10. Long term current use of opiate analgesic   11. Problems influencing health status   12. Vitamin B12 deficiency   13. Neurogenic pain   14. Vitamin D insufficiency    Problems updated and reviewed during this visit: Problem  Post-Thoracotomy Pain Syndrome  Chronic post-thoracotomy pain  Neurogenic Pain  Rib pain (Primary Area of Pain) (Right)  Lumbar facet syndrome  Ddd (Degenerative Disc Disease), Lumbar  Ddd (Degenerative Disc Disease), Lumbosacral  Sacroiliac Joint Dysfunction  Vitamin D Insufficiency    Plan of Care  Pharmacotherapy (Medications Ordered): Meds ordered this encounter  Medications  . gabapentin (NEURONTIN) 300 MG capsule    Sig: Take 1-3 capsules (300-900 mg total) by mouth 4 (four) times daily. Follow the written titration instructions.    Dispense:  360 capsule    Refill:  2    Do not place medication on "Automatic Refill". Fill one day early if pharmacy is closed on scheduled refill date.  . ergocalciferol (VITAMIN D2) 1.25 MG (50000 UT) capsule    Sig: Take 1 capsule (50,000 Units total) by mouth 2 (two) times a week. X 6 weeks.    Dispense:  12 capsule    Refill:  0    Do not add this medication to the electronic "Automatic Refill" notification system. Patient may have prescription filled one day early if pharmacy is closed on scheduled  refill date.  . Cholecalciferol (VITAMIN D3) 125 MCG (5000 UT) CAPS    Sig: Take 1 capsule (5,000 Units total) by mouth daily with breakfast. Take along with calcium and magnesium.    Dispense:  30 capsule    Refill:  5    Do not place medication on "Automatic Refill".  May substitute with similar over-the-counter product.  . Calcium Carbonate-Vit D-Min (GNP CALCIUM 1200) 1200-1000 MG-UNIT CHEW    Sig: Chew 1,200 mg by mouth daily with breakfast. Take in combination with vitamin D and magnesium.    Dispense:  30 tablet    Refill:  5    Do not place medication on "Automatic Refill".  May substitute with similar over-the-counter product.  . Magnesium 500 MG CAPS    Sig: Take 1 capsule (500 mg total) by mouth 2 (two) times daily at 8 am and 10 pm.    Dispense:  60 capsule    Refill:  5    Do not place medication on "Automatic Refill".  The patient may use similar over-the-counter product.    Procedure Orders     INTERCOSTAL NERVE BLOCK Lab Orders  No laboratory test(s) ordered today   Imaging Orders  No imaging studies ordered today   Referral Orders  No referral(s) requested today   Addendum note: (08/30/2018 4:06 PM) today I was asked whether or not we will be taking over the patient's medications.  Perhaps I did not clarify this sufficiently in my note but during my interview with the patient she indicated that her opioids were not controlling her pain and that they in fact were not doing anything towards relieving her intercostal pain.  At the time, she showed me her bottle of oxycodone.  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  Because this is a nerve type of pain usually responds much better to  the use of membrane stabilizers such as gabapentin or pregabalin.  I provided the patient with instructions on how to  increase that medication and I did not prescribe any opioid analgesics as she had indicated to me that they did not work.  Although I intend to take over her medication management, in terms of the opioid management, essentially what I will do is discontinue this medication.  Pharmacological management options:  Opioid Analgesics: We'll take over management today. See above orders Membrane stabilizer: We have discussed the possibility of optimizing this mode of therapy, if tolerated Muscle relaxant: We have discussed the possibility of a trial NSAID: We have discussed the possibility of a trial Other analgesic(s): To be determined at a later time   Interventional management options: Planned, scheduled, and/or pending:    Diagnostic right intercostal nerve block #1 under fluoroscopic guidance and IV sedation    Considering:   Diagnostic right intercostal nerve block  Possible right intercostal radiofrequency ablation   PRN Procedures:   None at this time   Provider-requested follow-up: Return for Procedure (w/ sedation): (R) Intercostal NB #1.  Future Appointments  Date Time Provider [place]  09/05/2018  2:00 PM Cammie Sickle, MD [facility] None  09/06/2018 11:00 AM Valerie Roys, DO [facility]    Primary Care Physician: Valerie Roys, DO Location: [facility] Note by: Gaspar Cola, MD Date: 08/29/2018; Time: 5:07 PM

## 2018-08-29 ENCOUNTER — Telehealth: Payer: Self-pay | Admitting: Family Medicine

## 2018-08-29 ENCOUNTER — Other Ambulatory Visit: Payer: Self-pay

## 2018-08-29 ENCOUNTER — Ambulatory Visit: Payer: Medicare HMO | Attending: Pain Medicine | Admitting: Pain Medicine

## 2018-08-29 ENCOUNTER — Encounter: Payer: Self-pay | Admitting: Pain Medicine

## 2018-08-29 VITALS — BP 145/84 | HR 57 | Temp 98.2°F | Resp 18 | Ht 62.0 in | Wt 197.0 lb

## 2018-08-29 DIAGNOSIS — R0789 Other chest pain: Secondary | ICD-10-CM

## 2018-08-29 DIAGNOSIS — C3431 Malignant neoplasm of lower lobe, right bronchus or lung: Secondary | ICD-10-CM | POA: Diagnosis not present

## 2018-08-29 DIAGNOSIS — F322 Major depressive disorder, single episode, severe without psychotic features: Secondary | ICD-10-CM | POA: Diagnosis not present

## 2018-08-29 DIAGNOSIS — R0781 Pleurodynia: Secondary | ICD-10-CM | POA: Diagnosis not present

## 2018-08-29 DIAGNOSIS — G894 Chronic pain syndrome: Secondary | ICD-10-CM

## 2018-08-29 DIAGNOSIS — J449 Chronic obstructive pulmonary disease, unspecified: Secondary | ICD-10-CM | POA: Diagnosis not present

## 2018-08-29 DIAGNOSIS — Z79899 Other long term (current) drug therapy: Secondary | ICD-10-CM

## 2018-08-29 DIAGNOSIS — G8922 Chronic post-thoracotomy pain: Secondary | ICD-10-CM | POA: Diagnosis not present

## 2018-08-29 DIAGNOSIS — Z79891 Long term (current) use of opiate analgesic: Secondary | ICD-10-CM

## 2018-08-29 DIAGNOSIS — Z888 Allergy status to other drugs, medicaments and biological substances status: Secondary | ICD-10-CM | POA: Diagnosis not present

## 2018-08-29 DIAGNOSIS — M792 Neuralgia and neuritis, unspecified: Secondary | ICD-10-CM

## 2018-08-29 DIAGNOSIS — Z87891 Personal history of nicotine dependence: Secondary | ICD-10-CM | POA: Diagnosis not present

## 2018-08-29 DIAGNOSIS — C329 Malignant neoplasm of larynx, unspecified: Secondary | ICD-10-CM | POA: Diagnosis not present

## 2018-08-29 DIAGNOSIS — L7682 Other postprocedural complications of skin and subcutaneous tissue: Secondary | ICD-10-CM | POA: Diagnosis not present

## 2018-08-29 DIAGNOSIS — Z85118 Personal history of other malignant neoplasm of bronchus and lung: Secondary | ICD-10-CM | POA: Diagnosis not present

## 2018-08-29 DIAGNOSIS — Z8521 Personal history of malignant neoplasm of larynx: Secondary | ICD-10-CM | POA: Insufficient documentation

## 2018-08-29 DIAGNOSIS — M5137 Other intervertebral disc degeneration, lumbosacral region: Secondary | ICD-10-CM | POA: Diagnosis not present

## 2018-08-29 DIAGNOSIS — G8912 Acute post-thoracotomy pain: Secondary | ICD-10-CM | POA: Diagnosis not present

## 2018-08-29 DIAGNOSIS — M899 Disorder of bone, unspecified: Secondary | ICD-10-CM

## 2018-08-29 DIAGNOSIS — Z7982 Long term (current) use of aspirin: Secondary | ICD-10-CM | POA: Diagnosis not present

## 2018-08-29 DIAGNOSIS — E538 Deficiency of other specified B group vitamins: Secondary | ICD-10-CM | POA: Diagnosis not present

## 2018-08-29 DIAGNOSIS — F418 Other specified anxiety disorders: Secondary | ICD-10-CM | POA: Insufficient documentation

## 2018-08-29 DIAGNOSIS — E559 Vitamin D deficiency, unspecified: Secondary | ICD-10-CM | POA: Diagnosis not present

## 2018-08-29 DIAGNOSIS — M5136 Other intervertebral disc degeneration, lumbar region: Secondary | ICD-10-CM | POA: Insufficient documentation

## 2018-08-29 DIAGNOSIS — Z789 Other specified health status: Secondary | ICD-10-CM

## 2018-08-29 MED ORDER — ERGOCALCIFEROL 1.25 MG (50000 UT) PO CAPS: 50000.0000 [IU] | ORAL_CAPSULE | ORAL | 0 refills | Status: DC

## 2018-08-29 MED ORDER — VITAMIN D3 125 MCG (5000 UT) PO CAPS: 1.0000 | ORAL_CAPSULE | Freq: Every day | ORAL | 5 refills | Status: DC

## 2018-08-29 MED ORDER — MAGNESIUM 500 MG PO CAPS: 500.0000 mg | ORAL_CAPSULE | Freq: Two times a day (BID) | ORAL | 5 refills | Status: DC

## 2018-08-29 MED ORDER — GNP CALCIUM 1200 1200-1000 MG-UNIT PO CHEW: 1200.0000 mg | CHEWABLE_TABLET | Freq: Every day | ORAL | 5 refills | Status: DC

## 2018-08-29 MED ORDER — GABAPENTIN 300 MG PO CAPS: 300.0000 mg | ORAL_CAPSULE | Freq: Four times a day (QID) | ORAL | 2 refills | Status: DC

## 2018-08-29 NOTE — Telephone Encounter (Signed)
Copied from [place] [id number]. Topic: Quick Communication - Rx Refill/Question >> Aug 29, 2018  3:58 PM Sheran Luz wrote: Medication: oxyCODONE ER [facility] ER) 9 MG C12A  Patient is requesting a refill of this medication. Patient states she is willing to make an appointment if needed- Offered patient appointment for tomorrow but pt declined stating she has limited transportation.   Preferred Pharmacy (with phone number or street name):[id number] - [place], [place] [phone] (Phone) [phone] (Fax)

## 2018-08-29 NOTE — Progress Notes (Signed)
Safety precautions to be maintained throughout the outpatient stay will include: orient to surroundings, keep bed in low position, maintain call bell within reach at all times, provide assistance with transfer out of bed and ambulation.  

## 2018-08-29 NOTE — Patient Instructions (Addendum)
__ Electronic Rx for Gabapentin and vitamins.  __________________________________________________________________________________________  Preparing for Procedure with Sedation  Instructions: . Oral Intake: Do not eat or drink anything for at least 8 hours prior to your procedure. . Transportation: Public transportation is not allowed. Bring an adult driver. The driver must be physically present in our waiting room before any procedure can be started. [place] Physical Assistance: Bring an adult physically capable of assisting you, in the event you need help. This adult should keep you company at home for at least 6 hours after the procedure. . Blood Pressure Medicine: Take your blood pressure medicine with a sip of water the morning of the procedure. . Blood thinners: Notify our staff if you are taking any blood thinners. Depending on which one you take, there will be specific instructions on how and when to stop it. . Diabetics on insulin: Notify the staff so that you can be scheduled 1st case in the morning. If your diabetes requires high dose insulin, take only  of your normal insulin dose the morning of the procedure and notify the staff that you have done so. . Preventing infections: Shower with an antibacterial soap the morning of your procedure. . Build-up your immune system: Take 1000 mg of Vitamin C with every meal (3 times a day) the day prior to your procedure. [place] Kitchen Antibiotics: Inform the staff if you have a condition or reason that requires you to take antibiotics before dental procedures. . Pregnancy: If you are pregnant, call and cancel the procedure. . Sickness: If you have a cold, fever, or any active infections, call and cancel the procedure. . Arrival: You must be in the facility at least 30 minutes prior to your scheduled procedure. . Children: Do not bring children with you. . Dress appropriately: Bring dark clothing that you would not mind if they get stained. . Valuables: Do not  bring any jewelry or valuables.  Procedure appointments are reserved for interventional treatments only. [place] Kitchen No Prescription Refills. . No medication changes will be discussed during procedure appointments. . No disability issues will be discussed.  Reasons to call and reschedule or cancel your procedure: (Following these recommendations will minimize the risk of a serious complication.) . Surgeries: Avoid having procedures within 2 weeks of any surgery. (Avoid for 2 weeks before or after any surgery). . Flu Shots: Avoid having procedures within 2 weeks of a flu shots or . (Avoid for 2 weeks before or after immunizations). . Barium: Avoid having a procedure within 7-10 days after having had a radiological study involving the use of radiological contrast. (Myelograms, Barium swallow or enema study). . Heart attacks: Avoid any elective procedures or surgeries for the initial 6 months after a "Myocardial Infarction" (Heart Attack). . Blood thinners: It is imperative that you stop these medications before procedures. Let us know if you if you take any blood thinner.  . Infection: Avoid procedures during or within two weeks of an infection (including chest colds or gastrointestinal problems). Symptoms associated with infections include: Localized redness, fever, chills, night sweats or profuse sweating, burning sensation when voiding, cough, congestion, stuffiness, runny nose, sore throat, diarrhea, nausea, vomiting, cold or Flu symptoms, recent or current infections. It is specially important if the infection is over the area that we intend to treat. Marland Kitchen Heart and lung problems: Symptoms that may suggest an active cardiopulmonary problem include: cough, chest pain, breathing difficulties or shortness of breath, dizziness, ankle swelling, uncontrolled high or unusually low blood pressure, and/or palpitations. If  you are experiencing any of these symptoms, cancel your procedure and contact your primary care  physician for an evaluation.  Remember:  Regular Business hours are:  Monday to Thursday 8:00 AM to 4:00 PM  Provider's Schedule: Milinda Pointer, MD:  Procedure days: Tuesday and Thursday 7:30 AM to 4:00 PM  Gillis Santa, MD:  Procedure days: Monday and Wednesday 7:30 AM to 4:00 PM ____________________________________________________________________________________________   ____________________________________________________________________________________________  Gabapentin Titration  Medication used: Gabapentin (Generic Name) or Neurontin (Brand Name) 300 mg tablets/capsules  Reasons to stop increasing the dose:  Reason 1: You get good relief of symptoms, in which case there is no need to increase the daily dose any further.    Reason 2: You develop some side effects, such as sleeping all of the time, difficulty concentrating, or becoming disoriented, in which case you need to go down on the dose, to the prior level, where you were not experiencing any side effects. Stay on that dose longer, to allow more time for your body to get use it, before attempting to increase it again.   Reasons to stop increasing the dose: Reason 1: You get good relief of symptoms, in which case there is no need to increase the daily dose any further.  Reason 2: You develop some side effects, such as sleeping all of the time, difficulty concentrating, or becoming disoriented, in which case you need to go down on the dose, to the prior level, where you were not experiencing any side effects. Stay on that dose longer, to allow more time for your body to get use it, before attempting to increase it again.  Steps to increase medication: Step 1: Start by taking 1 (one) tablet at bedtime x 7 (seven) days.  Step 2: After 7 (seven) days of taking 1 (one) tablet at bedtime, increase it to 2 (two) tablets at bedtime. Stay on this dose x 7 (seven) days.  Step 3: After 7 (seven) days of taking 2 (two)  tablets at bedtime, increase it to 3 (three) tablets at bedtime. Stay on this dose x another 7 (seven) days.  Step 4: After 7 (seven) days of taking 3 (three) tablet at bedtime, begin taking 1 (one) tablet at noon with lunch. Stay on this dose x another 7 (seven) days.  Step 5: After 7 (seven) days of taking 3 (three) tablet at bedtime, and 1 (one) tablet at noon, then begin taking 1 (one) tablet in the afternoon with dinner. Stay on this dose x another 7 (seven) days.  Step 6: After 7 (seven) days of taking 3 (three) tablet at bedtime, 1 (one) tablet at noon, and 1 (one) tablet in the afternoon, then begin taking 1 (one) tablet in the morning with breakfast. Stay on this dose x another 7 (seven) days. At this point you should be taking the medicine 4 (four) times a day, or about every 6 (six) hours. This daily regimen of taking the medicine 4 (four) times a day, will be maintained from now on. You should not take any doses any sooner than every 6 (six) hours.  Step 7: After 7 (seven) days of taking 3 (three) tablet at bedtime, 1 (one) tablet at noon, 1 (one) tablet in the afternoon, and 1 (one) tablet in the morning, begin taking 2 (two) tablets at noon with lunch. Stay on this dose x another 7 (seven) days.   Step 8: After 7 (seven) days of taking 3 (three) tablet at bedtime, 2 (two) tablets at noon,  1 (one) tablet in the afternoon, and 1 (one) tablet in the morning, begin taking 2 (two) tablets in the afternoon with dinner. Stay on this dose x another 7 (seven) days.   Step 9: After 7 (seven) days of taking 3 (three) tablet at bedtime, 2 (two) tablets at noon, 2 (two) tablets in the afternoon, and 1 (one) tablet in the morning, begin taking 2 (two) tablets in the morning with breakfast. Stay on this dose x another 7 (seven) days. At this point you should be taking the medicine 4 (four) times a day, or about every 6 (six) hours. This daily regimen of taking the medicine 4 (four) times a day, will be  maintained from now on. You should not take any doses any sooner than every 6 (six) hours.  Step 10: After 7 (seven) days of taking 3 (three) tablet at bedtime, 2 (two) tablets at noon, 2 (two) tablets in the afternoon, and 2 (two) tablets in the morning, begin taking 3 (three) tablets at noon with lunch. Stay on this dose x another 7 (seven) days.   Step 11: After 7 (seven) days of taking 3 (three) tablet at bedtime, 3 (three) tablets at noon, 2 (two) tablets in the afternoon, and 2 (two) tablets in the morning, begin taking 3 (three) tablets in the afternoon with dinner. Stay on this dose x another 7 (seven) days.   Step 12: After 7 (seven) days of taking 3 (three) tablet at bedtime, 3 (three) tablets at noon, 3 (three) tablets in the afternoon, and 2 (two) tablet in the morning, begin taking 3 (three) tablets in the morning with breakfast. Stay on this dose x another 7 (seven) days. At this point you should be taking the medicine 4 (four) times a day, or about every 6 (six) hours. This daily regimen of taking the medicine 4 (four) times a day, will be maintained from now on.   Endpoint: Once you have reached the maximum dose you can tolerate without side-effects, contact your physician so as to evaluate the results of the regimen.   Questions: Feel free to contact us for any questions or problems at [phone] ____________________________________________________________________________________________

## 2018-08-29 NOTE — Telephone Encounter (Signed)
Medication last ordered on 06/28/18. Not on pt's current medication list.    LOV: 07/13/18 PCP: Dr. Wynetta Emery

## 2018-08-30 ENCOUNTER — Telehealth: Payer: Self-pay | Admitting: Pain Medicine

## 2018-08-30 NOTE — Telephone Encounter (Signed)
Per note from pain management yesterday they were going to take over opiate Rxs, but it doesn't look like they gave her a refill. I will be happy to see her to get her a refill, but I don't want to cause a problem with pain management- can we check with them?

## 2018-08-30 NOTE — Telephone Encounter (Signed)
Patient taking her last pill tonight.

## 2018-08-30 NOTE — Telephone Encounter (Signed)
Rodena Piety from [facility] practice calling to see if Dr. Dossie Arbour is ok with Dr. Wynetta Emery writing for Oxycodone ER 9mg  for the patient or is he going to take over. The patient needs refill. Dr. Wynetta Emery will be happy to bring patient in for eval and meds as long as it is ok with Dr. Dossie Arbour

## 2018-08-30 NOTE — Telephone Encounter (Signed)
Called [facility] pain management and they said they will give [place] a call back to let us know if they are going to take care of this Rx or Dr. Wynetta Emery can see the patient and refill the Rx for patient.

## 2018-08-30 NOTE — Telephone Encounter (Signed)
Dr. Adalberto Cole note on 08-29-18 states he is taking med management on that day. However, I do not see that any Oxycodone was prescribed. Will ask him about this and call Dr. Mattie Marlin office.

## 2018-08-31 NOTE — Telephone Encounter (Signed)
Appointment scheduled for Friday 09/02/2018 at 9:45.

## 2018-08-31 NOTE — Telephone Encounter (Signed)
Spoke with Susan Houston to try to get her scheduled- cannot come in until Friday. She told the pain clinic that her pain medicine was not working so they were going to stop it. She states that it was working more than she thought and she is in a lot of pain. Will get her in on Friday to discuss and consider refill- she will need to discuss further with pain management after 1 month.

## 2018-08-31 NOTE — Telephone Encounter (Signed)
Spoke with Dr. Dossie Arbour, he does not plan to prescribe opioid therapy. Rodena Piety notified.

## 2018-08-31 NOTE — Telephone Encounter (Signed)
Can we reach back out to the pain clinic?

## 2018-08-31 NOTE — Telephone Encounter (Signed)
Called pain clinic, no answer. Left VM for them to call us back.

## 2018-08-31 NOTE — Telephone Encounter (Signed)
Dena from pain clinic called back and stated that Dr. Dossie Arbour said that they won't do the Oxycodone refill/prescription for patient. I stated if Dr, Wynetta Emery can see the patient and take care of it and Dena stated "yes"

## 2018-09-02 ENCOUNTER — Ambulatory Visit: Payer: Self-pay | Admitting: Family Medicine

## 2018-09-05 ENCOUNTER — Inpatient Hospital Stay: Payer: Medicare HMO | Attending: Internal Medicine | Admitting: Internal Medicine

## 2018-09-05 ENCOUNTER — Encounter: Payer: Self-pay | Admitting: Internal Medicine

## 2018-09-05 VITALS — BP 130/82 | HR 66 | Temp 99.0°F | Resp 16 | Wt 191.4 lb

## 2018-09-05 DIAGNOSIS — I10 Essential (primary) hypertension: Secondary | ICD-10-CM | POA: Insufficient documentation

## 2018-09-05 DIAGNOSIS — Z87891 Personal history of nicotine dependence: Secondary | ICD-10-CM | POA: Insufficient documentation

## 2018-09-05 DIAGNOSIS — Z7982 Long term (current) use of aspirin: Secondary | ICD-10-CM

## 2018-09-05 DIAGNOSIS — C3431 Malignant neoplasm of lower lobe, right bronchus or lung: Secondary | ICD-10-CM | POA: Diagnosis not present

## 2018-09-05 DIAGNOSIS — J449 Chronic obstructive pulmonary disease, unspecified: Secondary | ICD-10-CM | POA: Diagnosis not present

## 2018-09-05 DIAGNOSIS — Z8521 Personal history of malignant neoplasm of larynx: Secondary | ICD-10-CM | POA: Insufficient documentation

## 2018-09-05 DIAGNOSIS — Z79899 Other long term (current) drug therapy: Secondary | ICD-10-CM | POA: Insufficient documentation

## 2018-09-05 MED ORDER — DULOXETINE HCL 30 MG PO CPEP: 30.0000 mg | ORAL_CAPSULE | Freq: Every day | ORAL | 3 refills | Status: DC

## 2018-09-05 NOTE — Patient Instructions (Signed)
#  Stop trazodone #Start Cymbalta as directed.

## 2018-09-05 NOTE — Assessment & Plan Note (Addendum)
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  Stage I.  Clinically no evidence of recurrence.  Stable.  Recommend repeat imaging in mid January 2020.  This has been ordered.  #Stage II laryngeal cancer stable.  Clinically no evidence of disease.  # Post-thoracotomy syndrome-worse.  On gabapentin.  Discontinue trazodone at [facility].  New prescription given.  Also await nerve ablation with pain physician.  #COPD.  Stable.  # DISPOSITION:  #  follow up in 2 months-labs- cbc/cmp; CT chest prior-Dr.B.

## 2018-09-05 NOTE — Progress Notes (Signed)
[facility] OFFICE PROGRESS NOTE  Patient Care Team: Valerie Roys, DO as PCP - General (Family Medicine) Anabel Bene, MD as Referring Physician (Neurology) Telford Nab, RN as Registered Nurse  Cancer Staging No matching staging information was found for the patient.   Oncology History   # 2011-laryngeal cancer/SCC; Stage II; April-June 2011 s/p  CRT [Dr.Choski/Bennett]  # 2019- likely STAGE I 10 mm RLL nodule [incidental]; s/p wedge resection-July 2019-stage I squamous cell lung cancer.  Adjuvant therapy.  # COPD  # post thoracotomy syndrome  DIAGNOSIS: RLL lung ca  STAGE:  I       ;GOALS: cure  CURRENT/MOST RECENT THERAPY: surveillaince       Laryngeal cancer ([place])    Primary cancer of right lower lobe of lung ([place])   05/27/2018 Initial Diagnosis    Primary cancer of right lower lobe of lung ([place])     INTERVAL HISTORY:  Susan Houston 57 y.o.  female pleasant patient with a history of stage I squamous cell lung cancer status post wedge resection right lower lobe August 2019 is here for follow-up.  Patient currently continues to be on oxygen 2 L at night.  Complains of worsening pain and swelling in the right chest wall.  Patient is on gabapentin.  She has been followed closely by Dr. Dossie Arbour pain specialist.  She is awaiting ablation of the intercostal nerves.  Denies any worsening shortness of breath or cough.  Review of Systems  Constitutional: Positive for malaise/fatigue. Negative for chills, diaphoresis, fever and weight loss.  HENT: Negative for nosebleeds and sore throat.   Eyes: Negative for double vision.  Respiratory: Positive for cough and shortness of breath. Negative for hemoptysis, sputum production and wheezing.   Cardiovascular: Positive for chest pain (At the site of surgery.). Negative for palpitations, orthopnea and leg swelling.  Gastrointestinal: Negative for abdominal pain, blood in stool, constipation, diarrhea,  heartburn, melena, nausea and vomiting.  Genitourinary: Negative for dysuria, frequency and urgency.  Musculoskeletal: Negative for back pain and joint pain.  Skin: Negative.  Negative for itching and rash.  Neurological: Negative for dizziness, tingling, focal weakness, weakness and headaches.  Endo/Heme/Allergies: Does not bruise/bleed easily.  Psychiatric/Behavioral: Negative for depression. The patient is not nervous/anxious and does not have insomnia.       PAST MEDICAL HISTORY :  Past Medical History:  Diagnosis Date  . Anemia    vitamin b12 deficiency. no longer taking supplements  . Anxiety   . Arthritis   . Asthma   . Back pain   . Bruises easily   . Cancer ([place]) 2009   [facility] CANCER - CHEMO / RADIATION (NO SURGERY)   . Chronic leg pain    BILATERAL  . COPD (chronic obstructive pulmonary disease) (HCC)    no inhalers for over 1 year  . Depression   . Difficulty sleeping   . Diverticulitis   . Dysrhythmia 04/2018   brady episodes. cleared by dr. Clayborn Bigness  . Facet syndrome, lumbar 03/28/2015  . Hemorrhoids   . History of kidney stones 04/2018   recently passed stone  . Hyperlipidemia   . Hypertension 2019   not on any treatment currently  . Incontinence of urine   . Laryngeal cancer ([place]) 2009  . Lung cancer ([place]) 04/2018   diagnosed via ct scan  . Migraine   . Migraine without aura and without status migrainosus, not intractable 09/11/2016  . Mild cognitive impairment 11/23/2017  . MRSA infection  greater than 3 months ago    2008 right side of face  . Nerve damage    right leg. thinks this is from her TKR  . Nodule of lower lobe of right lung 04/22/2018  . Oxygen deficiency    2L/HS  . Wears dentures    full upper and lower    PAST SURGICAL HISTORY :   Past Surgical History:  Procedure Laterality Date  . ABDOMINAL HYSTERECTOMY  1983  . BLADDER SUSPENSION  2014  . CATARACT EXTRACTION W/PHACO Left 05/11/2018   Procedure: CATARACT EXTRACTION PHACO AND  INTRAOCULAR LENS PLACEMENT ([place]) LEFT;  Surgeon: Leandrew Koyanagi, MD;  Location: [facility];  Service: Ophthalmology;  Laterality: Left;  PREFERS EARLY  . CATARACT EXTRACTION W/PHACO Right 08/11/2018   Procedure: CATARACT EXTRACTION PHACO AND INTRAOCULAR LENS PLACEMENT ([facility]);  Surgeon: Leandrew Koyanagi, MD;  Location: [facility];  Service: Ophthalmology;  Laterality: Right;  CDE 5:02 Total Time 01:35 Fluid lot # [id number]  . CHOLECYSTECTOMY  1998  . COLONOSCOPY    . ESOPHAGOGASTRODUODENOSCOPY  2015  . ESOPHAGOGASTRODUODENOSCOPY (EGD) WITH PROPOFOL N/A 11/20/2016   Procedure: ESOPHAGOGASTRODUODENOSCOPY (EGD) WITH PROPOFOL;  Surgeon: Jonathon Bellows, MD;  Location: [facility];  Service: Endoscopy;  Laterality: N/A;  . EXCISION NEUROMA Bilateral 09/24/2014   Procedure: BILATERAL OPEN SAPHENOUS NEURECTOMIES AND OPEN LEFT PERIPATELLA OSETOPHYTECTOMY;  Surgeon: Mauri Pole, MD;  Location: [facility];  Service: Orthopedics;  Laterality: Bilateral;  . EYE SURGERY Left 05/11/2018   cataract extraction  . JOINT REPLACEMENT  2009/2010   BIL TOTAL KNEES  . THORACOTOMY/LOBECTOMY Right 05/16/2018   Procedure: THORACOTOMY/POSSIBLE LOBECTOMY;  Surgeon: Nestor Lewandowsky, MD;  Location: [facility];  Service: Thoracic;  Laterality: Right;  . TONSILLECTOMY    . VIDEO BRONCHOSCOPY N/A 05/16/2018   Procedure: PREOP  BRONCHOSCOPY;  Surgeon: Nestor Lewandowsky, MD;  Location: [facility];  Service: Thoracic;  Laterality: N/A;    FAMILY HISTORY :   Family History  Problem Relation Age of Onset  . Asthma Mother   . Diabetes Mother   . Hyperlipidemia Mother   . Hypertension Mother   . Cirrhosis Mother        Non-alcoholic  . Arthritis Father   . Cancer Father        Bone  . Hyperlipidemia Father   . Hypertension Father   . Anxiety disorder Sister   . Depression Sister   . Kidney Stones Son   . Stroke Maternal Grandmother   . Breast cancer Maternal Grandmother 26  . Cancer Maternal Grandfather        lung   . Pneumonia Paternal Grandmother   . Alzheimer's disease Paternal Grandfather   . Diabetes Sister   . Gout Sister   . Hypertension Sister   . Alcohol abuse Brother   . Heart disease Brother        massive MI    SOCIAL HISTORY:   Social History   Tobacco Use  . Smoking status: Former Smoker    Packs/day: 0.50    Types: Cigarettes    Last attempt to quit: 04/07/2016    Years since quitting: 2.4  . Smokeless tobacco: Never Used  . Tobacco comment: patient has not smoked x 10 days.06/28/17 chantix  Substance Use Topics  . Alcohol use: No    Alcohol/week: 0.0 standard drinks  . Drug use: No    ALLERGIES:  is allergic to morphine and related; adhesive [tape]; and lexapro [escitalopram oxalate].  MEDICATIONS:  Current Outpatient Medications  Medication Sig  Dispense Refill  . albuterol (PROVENTIL HFA;VENTOLIN HFA) 108 (90 Base) MCG/ACT inhaler Inhale 2 puffs into the lungs every 4 (four) hours as needed for wheezing or shortness of breath. 1 Inhaler 2  . aspirin 81 MG tablet Take 81 mg by mouth daily.    . Calcium Carbonate-Vit D-Min (GNP CALCIUM 1200) 1200-1000 MG-UNIT CHEW Chew 1,200 mg by mouth daily with breakfast. Take in combination with vitamin D and magnesium. 30 tablet 5  . Cholecalciferol (VITAMIN D3) 125 MCG (5000 UT) CAPS Take 1 capsule (5,000 Units total) by mouth daily with breakfast. Take along with calcium and magnesium. 30 capsule 5  . ergocalciferol (VITAMIN D2) 1.25 MG (50000 UT) capsule Take 1 capsule (50,000 Units total) by mouth 2 (two) times a week. X 6 weeks. 12 capsule 0  . gabapentin (NEURONTIN) 300 MG capsule Take 1-3 capsules (300-900 mg total) by mouth 4 (four) times daily. Follow the written titration instructions. 360 capsule 2  . Magnesium 500 MG CAPS Take 1 capsule (500 mg total) by mouth 2 (two) times daily at 8 am and 10 pm. 60 capsule 5  . tiZANidine (ZANAFLEX) 4 MG tablet Take 2 mg by mouth 3 (three) times daily.    . traZODone (DESYREL) 100 MG  tablet Take 100 mg by mouth at bedtime.     . DULoxetine (CYMBALTA) 30 MG capsule Take 1 capsule (30 mg total) by mouth daily. 30 capsule 3   No current facility-administered medications for this visit.    Facility-Administered Medications Ordered in Other Visits  Medication Dose Route Frequency Provider Last Rate Last Dose  . ipratropium-albuterol (DUONEB) 0.5-2.5 (3) MG/3ML nebulizer solution 3 mL  3 mL Nebulization Q6H Burns, Jennifer E, NP        PHYSICAL EXAMINATION: ECOG PERFORMANCE STATUS: 1 - Symptomatic but completely ambulatory  BP 130/82 (BP Location: Left Arm, Patient Position: Sitting)   Pulse 66   Temp 99 F (37.2 C) (Tympanic)   Resp 16   Wt 191 lb 6.4 oz (86.8 kg)   BMI 35.01 kg/m   Filed Weights   09/05/18 1413  Weight: 191 lb 6.4 oz (86.8 kg)    Physical Exam  Constitutional: She is oriented to person, place, and time and well-developed, well-nourished, and in no distress.  Accompanied by husband.  She is walking herself.  Not on oxygen.  HENT:  Head: Normocephalic and atraumatic.  Mouth/Throat: Oropharynx is clear and moist. No oropharyngeal exudate.  Eyes: Pupils are equal, round, and reactive to light.  Neck: Normal range of motion. Neck supple.  Cardiovascular: Normal rate and regular rhythm.  Pulmonary/Chest: No respiratory distress. She has no wheezes.  Right chest wall incision wound healing.  Swelling noted.  No redness.  Tenderness to touch.  Abdominal: Soft. Bowel sounds are normal. She exhibits no distension and no mass. There is no tenderness. There is no rebound and no guarding.  Musculoskeletal: Normal range of motion. She exhibits no edema or tenderness.  Neurological: She is alert and oriented to person, place, and time.  Skin: Skin is warm.  Psychiatric: Affect normal.       LABORATORY DATA:  I have reviewed the data as listed    Component Value Date/Time   NA 137 07/20/2018 1419   NA 137 12/03/2017 1447   NA 137 05/14/2014 1457    K 4.5 07/20/2018 1419   K 3.2 (L) 05/14/2014 1457   CL 105 07/20/2018 1419   CL 103 05/14/2014 1457   CO2 24 07/20/2018  1419   CO2 28 05/14/2014 1457   GLUCOSE 101 (H) 07/20/2018 1419   GLUCOSE 80 05/14/2014 1457   BUN 16 07/20/2018 1419   BUN 8 12/03/2017 1447   BUN 8 05/14/2014 1457   CREATININE 0.81 07/20/2018 1419   CREATININE 0.79 05/14/2014 1457   CALCIUM 8.9 07/20/2018 1419   CALCIUM 8.9 05/14/2014 1457   PROT 6.9 07/20/2018 1419   PROT 6.8 12/03/2017 1447   PROT 7.0 05/14/2014 1457   ALBUMIN 3.6 07/20/2018 1419   ALBUMIN 3.8 12/03/2017 1447   ALBUMIN 3.3 (L) 05/14/2014 1457   AST 16 07/20/2018 1419   AST 9 (L) 05/14/2014 1457   ALT 11 07/20/2018 1419   ALT 15 05/14/2014 1457   ALKPHOS 87 07/20/2018 1419   ALKPHOS 87 05/14/2014 1457   BILITOT 0.5 07/20/2018 1419   BILITOT 0.6 12/03/2017 1447   BILITOT 0.2 05/14/2014 1457   GFRNONAA >60 07/20/2018 1419   GFRNONAA >60 05/14/2014 1457   GFRAA >60 07/20/2018 1419   GFRAA >60 05/14/2014 1457    No results found for: SPEP, UPEP  Lab Results  Component Value Date   WBC 6.4 07/20/2018   NEUTROABS 4.1 07/20/2018   HGB 10.7 (L) 07/20/2018   HCT 31.8 (L) 07/20/2018   MCV 84.8 07/20/2018   PLT 249 07/20/2018      Chemistry      Component Value Date/Time   NA 137 07/20/2018 1419   NA 137 12/03/2017 1447   NA 137 05/14/2014 1457   K 4.5 07/20/2018 1419   K 3.2 (L) 05/14/2014 1457   CL 105 07/20/2018 1419   CL 103 05/14/2014 1457   CO2 24 07/20/2018 1419   CO2 28 05/14/2014 1457   BUN 16 07/20/2018 1419   BUN 8 12/03/2017 1447   BUN 8 05/14/2014 1457   CREATININE 0.81 07/20/2018 1419   CREATININE 0.79 05/14/2014 1457      Component Value Date/Time   CALCIUM 8.9 07/20/2018 1419   CALCIUM 8.9 05/14/2014 1457   ALKPHOS 87 07/20/2018 1419   ALKPHOS 87 05/14/2014 1457   AST 16 07/20/2018 1419   AST 9 (L) 05/14/2014 1457   ALT 11 07/20/2018 1419   ALT 15 05/14/2014 1457   BILITOT 0.5 07/20/2018 1419    BILITOT 0.6 12/03/2017 1447   BILITOT 0.2 05/14/2014 1457       RADIOGRAPHIC STUDIES: I have personally reviewed the radiological images as listed and agreed with the findings in the report. No results found.   ASSESSMENT & PLAN:  Primary cancer of right lower lobe of lung (H[facility]) #Squamous cell carcinoma of the right lower lobe-status post wedge resection.  Stage I.  Clinically no evidence of recurrence.  Stable.  Recommend repeat imaging in mid January 2020.  This has been ordered.  #Stage II laryngeal cancer stable.  Clinically no evidence of disease.  # Post-thoracotomy syndrome-worse.  On gabapentin.  Discontinue trazodone at [facility].  New prescription given.  Also await nerve ablation with pain physician.  #COPD.  Stable.  # DISPOSITION:  #  follow up in 2 months-labs- cbc/cmp; CT chest prior-Dr.B.   Orders Placed This Encounter  Procedures  . CT CHEST W CONTRAST    Standing Status:   Future    Standing Expiration Date:   09/06/2019    Order Specific Question:   If indicated for the ordered procedure, I authorize the administration of contrast media per Radiology protocol    Answer:   Yes    Order  Specific Question:   Preferred imaging location?    Answer:   [facility]    Order Specific Question:   Radiology Contrast Protocol - do NOT remove file path    Answer:   [email or web address]    Order Specific Question:   ** REASON FOR EXAM (FREE TEXT)    Answer:   lung cancer    Order Specific Question:   Is patient pregnant?    Answer:   No   All questions were answered. The patient knows to call the clinic with any problems, questions or concerns.      Cammie Sickle, MD 09/05/2018 3:13 PM

## 2018-09-06 ENCOUNTER — Ambulatory Visit (INDEPENDENT_AMBULATORY_CARE_PROVIDER_SITE_OTHER): Payer: Medicare HMO | Admitting: Family Medicine

## 2018-09-06 ENCOUNTER — Encounter: Payer: Self-pay | Admitting: Family Medicine

## 2018-09-06 ENCOUNTER — Other Ambulatory Visit: Payer: Self-pay

## 2018-09-06 VITALS — BP 128/81 | HR 59 | Temp 98.5°F | Ht 62.0 in | Wt 191.0 lb

## 2018-09-06 DIAGNOSIS — C3431 Malignant neoplasm of lower lobe, right bronchus or lung: Secondary | ICD-10-CM | POA: Diagnosis not present

## 2018-09-06 MED ORDER — OXYCODONE ER 9 MG PO C12A: 9.0000 mg | EXTENDED_RELEASE_CAPSULE | Freq: Two times a day (BID) | ORAL | 0 refills | Status: DC

## 2018-09-06 NOTE — Assessment & Plan Note (Signed)
Pain significantly worse off xtampa. Will restart- goal of stopping after nerve block. 1 month supply given- she will try not to take it following shot to see how she does. Rx given. Recheck 1 month.

## 2018-09-06 NOTE — Progress Notes (Signed)
BP 128/81   Pulse (!) 59   Temp 98.5 F (36.9 C) (Oral)   Ht 5\' 2"  (1.575 m)   Wt 191 lb (86.6 kg)   SpO2 95%   BMI 34.93 kg/m    Subjective:    Patient ID: Susan Houston, female    DOB: Aug 06, 1961, 57 y.o.   MRN: [id number]  HPI: Susan Houston is a 57 y.o. female  Chief Complaint  Patient presents with  . Pain    f/u   CHRONIC PAIN- went to see pain management about a week ago, they are not going to continue her opiods, but are getting her set up with a nerve block and continuing her gabapentin Present dose: 27 Morphine equivalents Pain control status: worse- she thought that she didn't need the xtampa and that it wasn't helping, but then she stopped it and realized that she did need it Duration: 4 months Location: middle of her back into her breast on the R side at surgical site Quality: aching and sore Current Pain Level: 7/10 Previous Pain Level: 4/10 Breakthrough pain: yes Benefit from narcotic medications: yes What Activities task can be accomplished with current medication? Able to function and do her ADLs Interested in weaning off narcotics:no   Stool softners/OTC fiber: yes  Previous pain specialty evaluation: yes Non-narcotic analgesic meds: yes Narcotic contract: yes   Relevant past medical, surgical, family and social history reviewed and updated as indicated. Interim medical history since our last visit reviewed. Allergies and medications reviewed and updated.  Review of Systems  Constitutional: Negative.   Respiratory: Negative.   Cardiovascular: Negative.   Musculoskeletal: Positive for back pain and myalgias. Negative for arthralgias, gait problem, joint swelling, neck pain and neck stiffness.  Skin: Negative.   Neurological: Negative.   Psychiatric/Behavioral: Negative.     Per HPI unless specifically indicated above     Objective:    BP 128/81   Pulse (!) 59   Temp 98.5 F (36.9 C) (Oral)   Ht 5\' 2"  (1.575 m)   Wt 191 lb (86.6 kg)   SpO2  95%   BMI 34.93 kg/m   Wt Readings from Last 3 Encounters:  09/06/18 191 lb (86.6 kg)  09/05/18 191 lb 6.4 oz (86.8 kg)  08/29/18 197 lb (89.4 kg)    Physical Exam  Constitutional: She is oriented to person, place, and time. She appears well-developed and well-nourished. No distress.  HENT:  Head: Normocephalic and atraumatic.  Right Ear: Hearing normal.  Left Ear: Hearing normal.  Nose: Nose normal.  Eyes: Conjunctivae and lids are normal. Right eye exhibits no discharge. Left eye exhibits no discharge. No scleral icterus.  Cardiovascular: Normal rate, regular rhythm, normal heart sounds and intact distal pulses. Exam reveals no gallop and no friction rub.  No murmur heard. Pulmonary/Chest: Effort normal and breath sounds normal. No stridor. No respiratory distress. She has no wheezes. She has no rales. She exhibits no tenderness.  Musculoskeletal: Normal range of motion.  Neurological: She is alert and oriented to person, place, and time.  Skin: Skin is warm, dry and intact. Capillary refill takes less than 2 seconds. No rash noted. She is not diaphoretic. No erythema. No pallor.  Psychiatric: She has a normal mood and affect. Her speech is normal and behavior is normal. Judgment and thought content normal. Cognition and memory are normal.  Nursing note and vitals reviewed.   Results for orders placed or performed in visit on 08/18/18  Compliance Drug Analysis,  Ur  Result Value Ref Range   Summary FINAL   Magnesium  Result Value Ref Range   Magnesium 2.1 1.6 - 2.3 mg/dL  Vitamin B12  Result Value Ref Range   Vitamin B-12 405 232 - 1,245 pg/mL  Sedimentation rate  Result Value Ref Range   Sed Rate 45 (H) 0 - 40 mm/hr  25-Hydroxyvitamin D Lcms D2+D3  Result Value Ref Range   25-Hydroxy, Vitamin D 25 (L) ng/mL   25-Hydroxy, Vitamin D-2 2.1 ng/mL   25-Hydroxy, Vitamin D-3 23 ng/mL  C-reactive protein  Result Value Ref Range   CRP 6 0 - 10 mg/L      Assessment & Plan:    Problem List Items Addressed This Visit      Respiratory   Primary cancer of right lower lobe of lung (HCC) - Primary    Pain significantly worse off xtampa. Will restart- goal of stopping after nerve block. 1 month supply given- she will try not to take it following shot to see how she does. Rx given. Recheck 1 month.           Follow up plan: Return in about 4 weeks (around 10/04/2018) for Follow up pain.

## 2018-09-08 ENCOUNTER — Ambulatory Visit
Admission: RE | Admit: 2018-09-08 | Discharge: 2018-09-08 | Disposition: A | Discharge status: Home / Self Care | Payer: Medicare HMO | Source: Ambulatory Visit | Attending: Pain Medicine | Admitting: Pain Medicine

## 2018-09-08 ENCOUNTER — Ambulatory Visit: Payer: Medicare HMO | Admitting: Pain Medicine

## 2018-09-08 ENCOUNTER — Encounter: Payer: Self-pay | Admitting: Pain Medicine

## 2018-09-08 ENCOUNTER — Other Ambulatory Visit: Payer: Self-pay

## 2018-09-08 VITALS — BP 181/82 | HR 65 | Temp 98.1°F | Resp 16 | Ht 62.0 in | Wt 191.0 lb

## 2018-09-08 DIAGNOSIS — G588 Other specified mononeuropathies: Secondary | ICD-10-CM | POA: Diagnosis not present

## 2018-09-08 DIAGNOSIS — Z888 Allergy status to other drugs, medicaments and biological substances status: Secondary | ICD-10-CM | POA: Insufficient documentation

## 2018-09-08 DIAGNOSIS — R0781 Pleurodynia: Secondary | ICD-10-CM

## 2018-09-08 DIAGNOSIS — Z79899 Other long term (current) drug therapy: Secondary | ICD-10-CM | POA: Diagnosis not present

## 2018-09-08 DIAGNOSIS — R0789 Other chest pain: Secondary | ICD-10-CM | POA: Insufficient documentation

## 2018-09-08 DIAGNOSIS — Z7982 Long term (current) use of aspirin: Secondary | ICD-10-CM | POA: Insufficient documentation

## 2018-09-08 DIAGNOSIS — G8918 Other acute postprocedural pain: Secondary | ICD-10-CM | POA: Diagnosis not present

## 2018-09-08 DIAGNOSIS — Z9049 Acquired absence of other specified parts of digestive tract: Secondary | ICD-10-CM | POA: Insufficient documentation

## 2018-09-08 DIAGNOSIS — L7682 Other postprocedural complications of skin and subcutaneous tissue: Secondary | ICD-10-CM | POA: Diagnosis not present

## 2018-09-08 DIAGNOSIS — R0782 Intercostal pain: Secondary | ICD-10-CM | POA: Insufficient documentation

## 2018-09-08 DIAGNOSIS — Z91048 Other nonmedicinal substance allergy status: Secondary | ICD-10-CM | POA: Insufficient documentation

## 2018-09-08 DIAGNOSIS — G8922 Chronic post-thoracotomy pain: Secondary | ICD-10-CM | POA: Insufficient documentation

## 2018-09-08 DIAGNOSIS — G8912 Acute post-thoracotomy pain: Secondary | ICD-10-CM | POA: Diagnosis not present

## 2018-09-08 MED ORDER — LIDOCAINE HCL 2 % IJ SOLN
20.0000 mL | Freq: Once | INTRAMUSCULAR | Status: AC
  Administered 2018-09-08: 400 mg
  Filled 2018-09-08: qty 40

## 2018-09-08 MED ORDER — MIDAZOLAM HCL 5 MG/5ML IJ SOLN
1.0000 mg | INTRAMUSCULAR | Status: DC | PRN
  Administered 2018-09-08: 2 mg via INTRAVENOUS
  Filled 2018-09-08: qty 5

## 2018-09-08 MED ORDER — FENTANYL CITRATE (PF) 100 MCG/2ML IJ SOLN
25.0000 ug | INTRAMUSCULAR | Status: DC | PRN
  Administered 2018-09-08: 50 ug via INTRAVENOUS
  Filled 2018-09-08: qty 2

## 2018-09-08 MED ORDER — LACTATED RINGERS IV SOLN
1000.0000 mL | Freq: Once | INTRAVENOUS | Status: AC
  Administered 2018-09-08: 1000 mL via INTRAVENOUS

## 2018-09-08 MED ORDER — DEXAMETHASONE SODIUM PHOSPHATE 10 MG/ML IJ SOLN
10.0000 mg | Freq: Once | INTRAMUSCULAR | Status: AC
  Administered 2018-09-08: 10 mg
  Filled 2018-09-08: qty 1

## 2018-09-08 MED ORDER — ROPIVACAINE HCL 2 MG/ML IJ SOLN
9.0000 mL | Freq: Once | INTRAMUSCULAR | Status: AC
  Administered 2018-09-08: 9 mL via PERINEURAL
  Filled 2018-09-08: qty 10

## 2018-09-08 NOTE — Progress Notes (Signed)
Patient's Name: Susan Houston  MRN: [id number]  Referring Provider: Milinda Pointer, MD  DOB: 09-Feb-1961  PCP: Valerie Roys, DO  DOS: 09/08/2018  Note by: Gaspar Cola, MD  Service setting: Ambulatory outpatient  Specialty: Interventional Pain Management  Patient type: Established  Location: [facility] (AMB) Pain Management Facility  Visit type: Interventional Procedure   Primary Reason for Visit: Interventional Pain Management Treatment. CC: Chest Pain (right, lateral)  Procedure:          Anesthesia, Analgesia, Anxiolysis:  Type: Diagnostic Posterolateral Intercostal  Nerve Block  #1  Region: Posterolateral Thoracic Area Level: T5, T6, & T7 Ribs Laterality: Right-Sided  Type: Moderate (Conscious) Sedation combined with Local Anesthesia Indication(s): Analgesia and Anxiety Route: Intravenous (IV) IV Access: Secured Sedation: Meaningful verbal contact was maintained at all times during the procedure  Local Anesthetic: Lidocaine 1-2%  Position: Prone   Indications: 1. Rib pain (Primary Area of Pain) (Right)   2. Post-thoracotomy pain syndrome   3. Incisional pain (Secondary Area of Pain)   4. Chronic post-thoracotomy pain   5. Chronic chest wall pain following surgery (Right)   6. Intercostal pain (Right)   7. Intercostal neuralgia    Pain Score: Pre-procedure: 7 /10 Post-procedure: 0-No pain/10  Pre-op Assessment:  Susan Houston is a 57 y.o. (year old), female patient, seen today for interventional treatment. She  has a past surgical history that includes Bladder suspension (2014); Tonsillectomy; Excision neuroma (Bilateral, 09/24/2014); Esophagogastroduodenoscopy (2015); Colonoscopy; Esophagogastroduodenoscopy (egd) with propofol (N/A, 11/20/2016); Joint replacement (2009/2010); Abdominal hysterectomy (1983); Eye surgery (Left, 05/11/2018); Cholecystectomy (1998); Cataract extraction w/PHACO (Left, 05/11/2018); Thoracotomy/lobectomy (Right, 05/16/2018); Video bronchoscopy (N/A,  05/16/2018); and Cataract extraction w/PHACO (Right, 08/11/2018). Susan Houston has a current medication list which includes the following prescription(s): albuterol, aspirin, gnp calcium 1200, vitamin d3, duloxetine, ergocalciferol, gabapentin, magnesium, oxycodone er, and tizanidine, and the following Facility-Administered Medications: fentanyl, ipratropium-albuterol, lactated ringers, and midazolam. Her primarily concern today is the Chest Pain (right, lateral)  Initial Vital Signs:  Pulse/HCG Rate: 64ECG Heart Rate: 63 Temp: 98.2 F (36.8 C) Resp: 16 BP: (!) 144/75 SpO2: 96 %  BMI: Estimated body mass index is 34.93 kg/m as calculated from the following:   Height as of this encounter: 5\' 2"  (1.575 m).   Weight as of this encounter: 191 lb (86.6 kg).  Risk Assessment: Allergies: Reviewed. She is allergic to morphine and related; adhesive [tape]; and lexapro [escitalopram oxalate].  Allergy Precautions: None required Coagulopathies: Reviewed. None identified.  Blood-thinner therapy: None at this time Active Infection(s): Reviewed. None identified. Susan Houston is afebrile  Site Confirmation: Susan Houston was asked to confirm the procedure and laterality before marking the site Procedure checklist: Completed Consent: Before the procedure and under the influence of no sedative(s), amnesic(s), or anxiolytics, the patient was informed of the treatment options, risks and possible complications. To fulfill our ethical and legal obligations, as recommended by the American Medical Association's Code of Ethics, I have informed the patient of my clinical impression; the nature and purpose of the treatment or procedure; the risks, benefits, and possible complications of the intervention; the alternatives, including doing nothing; the risk(s) and benefit(s) of the alternative treatment(s) or procedure(s); and the risk(s) and benefit(s) of doing nothing. The patient was provided information about the general risks  and possible complications associated with the procedure. These may include, but are not limited to: failure to achieve desired goals, infection, bleeding, organ or nerve damage, allergic reactions, paralysis, and death. In addition, the patient was  informed of those risks and complications associated to the procedure, such as failure to decrease pain; infection; bleeding; organ or nerve damage with subsequent damage to sensory, motor, and/or autonomic systems, resulting in permanent pain, numbness, and/or weakness of one or several areas of the body; allergic reactions; (i.e.: anaphylactic reaction); and/or death. Furthermore, the patient was informed of those risks and complications associated with the medications. These include, but are not limited to: allergic reactions (i.e.: anaphylactic or anaphylactoid reaction(s)); adrenal axis suppression; blood sugar elevation that in diabetics may result in ketoacidosis or comma; water retention that in patients with history of congestive heart failure may result in shortness of breath, pulmonary edema, and decompensation with resultant heart failure; weight gain; swelling or edema; medication-induced neural toxicity; particulate matter embolism and blood vessel occlusion with resultant organ, and/or nervous system infarction; and/or aseptic necrosis of one or more joints. Finally, the patient was informed that Medicine is not an exact science; therefore, there is also the possibility of unforeseen or unpredictable risks and/or possible complications that may result in a catastrophic outcome. The patient indicated having understood very clearly. We have given the patient no guarantees and we have made no promises. Enough time was given to the patient to ask questions, all of which were answered to the patient's satisfaction. Susan Houston has indicated that she wanted to continue with the procedure. Attestation: I, the ordering provider, attest that I have discussed with  the patient the benefits, risks, side-effects, alternatives, likelihood of achieving goals, and potential problems during recovery for the procedure that I have provided informed consent. Date  Time: 09/08/2018  9:11 AM  Pre-Procedure Preparation:  Monitoring: As per clinic protocol. Respiration, ETCO2, SpO2, BP, heart rate and rhythm monitor placed and checked for adequate function Safety Precautions: Patient was assessed for positional comfort and pressure points before starting the procedure. Time-out: I initiated and conducted the "Time-out" before starting the procedure, as per protocol. The patient was asked to participate by confirming the accuracy of the "Time Out" information. Verification of the correct person, site, and procedure were performed and confirmed by me, the nursing staff, and the patient. "Time-out" conducted as per Joint Commission's Universal Protocol (UP.01.01.01). Time: 1039  Description of Procedure:          Target Area: The sub-costal neurovascular bundle area Approach: Sub-costal approach Area Prepped: Entire Postero-lateral Thoracic Region Prepping solution: ChloraPrep (2% chlorhexidine gluconate and 70% isopropyl alcohol) Safety Precautions: Aspiration looking for blood return was conducted prior to all injections. At no point did we inject any substances, as a needle was being advanced. No attempts were made at seeking any paresthesias. Safe injection practices and needle disposal techniques used. Medications properly checked for expiration dates. SDV (single dose vial) medications used. Description of the Procedure: Protocol guidelines were followed. The patient was placed in position over the procedure table. The target area was identified and the area prepped in the usual manner. Skin & deeper tissues infiltrated with local anesthetic. After cleaning the skin with an antiseptic solution, 1-2 mL of dilute local anesthetic was infiltrated subcutaneously at the  planned injection site. The fingers of the palpating hand were used to straddle the insertion site at the inferior border of the rib and fix the skin to avoid unwanted skin movement. Appropriate amount of time allowed to pass for local anesthetics to take effect. The procedure needles were then advanced to the target area at an angle of approximately 20 cephalad to the skin. Contact with the rib  was made. While maintaining the same angle of insertion, the needle was walked off the inferior border of the rib as the skin was allowed to return to its initial position. Then the needle was advanced no more than 3 mm below the inferior margin of the rib. Proper needle placement was secured. Negative aspiration confirmed. Following negative aspiration for blood or air, 3-5 mL of local anesthetic was injected. The solution injected in intermittent fashion, asking for systemic symptoms every 0.5cc of injectate. The needle was then removed and the area cleansed, making sure to leave some of the prepping solution back to take advantage of its long term bactericidal properties. Vitals:   09/08/18 1107 09/08/18 1115 09/08/18 1121 09/08/18 1125  BP:  (!) 175/79  (!) 181/82  Pulse:      Resp:  14  16  Temp:    98.1 F (36.7 C)  TempSrc:    Temporal  SpO2: 94% 100% 99% 100%  Weight:      Height:        Start Time: 1039 hrs. End Time: 1055 hrs.   Materials:  Needle(s) Type: Spinal Needle Gauge: 22G Length: 1.5-in Medication(s): Please see orders for medications and dosing details.  Imaging Guidance (Non-Spinal):          Type of Imaging Technique: Fluoroscopy Guidance (Non-Spinal) Indication(s): Assistance in needle guidance and placement for procedures requiring needle placement in or near specific anatomical locations not easily accessible without such assistance. Exposure Time: Please see nurses notes. Contrast: Before injecting any contrast, we confirmed that the patient did not have an allergy to  iodine, shellfish, or radiological contrast. Once satisfactory needle placement was completed at the desired level, radiological contrast was injected. Contrast injected under live fluoroscopy. No contrast complications. See chart for type and volume of contrast used. Fluoroscopic Guidance: I was personally present during the use of fluoroscopy. "Tunnel Vision Technique" used to obtain the best possible view of the target area. Parallax error corrected before commencing the procedure. "Direction-depth-direction" technique used to introduce the needle under continuous pulsed fluoroscopy. Once target was reached, antero-posterior, oblique, and lateral fluoroscopic projection used confirm needle placement in all planes. Images permanently stored in EMR. Interpretation: I personally interpreted the imaging intraoperatively. Adequate needle placement confirmed in multiple planes. Appropriate spread of contrast into desired area was observed. No evidence of afferent or efferent intravascular uptake. Permanent images saved into the patient's record.  Antibiotic Prophylaxis:   Anti-infectives (From admission, onward)   None     Indication(s): None identified  Post-operative Assessment:  Post-procedure Vital Signs:  Pulse/HCG Rate: 6571 Temp: 98.1 F (36.7 C) Resp: 16 BP: (!) 181/82 SpO2: 100 %  EBL: None  Complications: No immediate post-treatment complications observed by team, or reported by patient.  Note: The patient tolerated the entire procedure well. A repeat set of vitals were taken after the procedure and the patient was kept under observation following institutional policy, for this type of procedure. Post-procedural neurological assessment was performed, showing return to baseline, prior to discharge. The patient was provided with post-procedure discharge instructions, including a section on how to identify potential problems. Should any problems arise concerning this procedure, the  patient was given instructions to immediately contact us, at any time, without hesitation. In any case, we plan to contact the patient by telephone for a follow-up status report regarding this interventional procedure.  Comments:  No additional relevant information.  Plan of Care   Imaging Orders     DG C-Arm 1-60  Min-No Report  Procedure Orders     INTERCOSTAL NERVE BLOCK  Medications ordered for procedure: Meds ordered this encounter  Medications  . lidocaine (XYLOCAINE) 2 % (with pres) injection 400 mg  . midazolam (VERSED) 5 MG/5ML injection 1-2 mg    Make sure Flumazenil is available in the pyxis when using this medication. If oversedation occurs, administer 0.2 mg IV over 15 sec. If after 45 sec no response, administer 0.2 mg again over 1 min; may repeat at 1 min intervals; not to exceed 4 doses (1 mg)  . fentaNYL (SUBLIMAZE) injection 25-50 mcg    Make sure Narcan is available in the pyxis when using this medication. In the event of respiratory depression (RR< 8/min): Titrate NARCAN (naloxone) in increments of 0.1 to 0.2 mg IV at 2-3 minute intervals, until desired degree of reversal.  . lactated ringers infusion 1,000 mL  . ropivacaine (PF) 2 mg/mL (0.2%) (NAROPIN) injection 9 mL  . dexamethasone (DECADRON) injection 10 mg   Medications administered: We administered lidocaine, midazolam, fentaNYL, lactated ringers, ropivacaine (PF) 2 mg/mL (0.2%), and dexamethasone.  See the medical record for exact dosing, route, and time of administration.  Disposition: Discharge home  Discharge Date & Time: 09/08/2018; 1126 hrs.   Physician-requested Follow-up: Return for post-procedure eval (2 wks), w/ Dr. Dossie Arbour.  Future Appointments  Date Time Provider [place]  10/04/2018  3:45 PM Valerie Roys, DO [facility]  11/04/2018 10:00 AM [facility]  11/07/2018  1:30 PM [facility] None  11/07/2018  2:00 PM Cammie Sickle, MD [facility]  None   Primary Care Physician: Valerie Roys, DO Location: [facility] Note by: Gaspar Cola, MD Date: 09/08/2018; Time: 11:28 AM  Disclaimer:  Medicine is not an exact science. The only guarantee in medicine is that nothing is guaranteed. It is important to note that the decision to proceed with this intervention was based on the information collected from the patient. The Data and conclusions were drawn from the patient's questionnaire, the interview, and the physical examination. Because the information was provided in large part by the patient, it cannot be guaranteed that it has not been purposely or unconsciously manipulated. Every effort has been made to obtain as much relevant data as possible for this evaluation. It is important to note that the conclusions that lead to this procedure are derived in large part from the available data. Always take into account that the treatment will also be dependent on availability of resources and existing treatment guidelines, considered by other Pain Management Practitioners as being common knowledge and practice, at the time of the intervention. For Medico-Legal purposes, it is also important to point out that variation in procedural techniques and pharmacological choices are the acceptable norm. The indications, contraindications, technique, and results of the above procedure should only be interpreted and judged by a Board-Certified Interventional Pain Specialist with extensive familiarity and expertise in the same exact procedure and technique.

## 2018-09-08 NOTE — Patient Instructions (Addendum)
____________________________________________________________________________________________  Post-Procedure Discharge Instructions  Instructions:  Apply ice: Fill a plastic sandwich bag with crushed ice. Cover it with a small towel and apply to injection site. Apply for 15 minutes then remove x 15 minutes. Repeat sequence on day of procedure, until you go to bed. The purpose is to minimize swelling and discomfort after procedure.  Apply heat: Apply heat to procedure site starting the day following the procedure. The purpose is to treat any soreness and discomfort from the procedure.  Food intake: Start with clear liquids (like water) and advance to regular food, as tolerated.   Physical activities: Keep activities to a minimum for the first 8 hours after the procedure.   Driving: If you have received any sedation, you are not allowed to drive for 24 hours after your procedure.  Blood thinner: Restart your blood thinner 6 hours after your procedure. (Only for those taking blood thinners)  Insulin: As soon as you can eat, you may resume your normal dosing schedule. (Only for those taking insulin)  Infection prevention: Keep procedure site clean and dry.  Post-procedure Pain Diary: Extremely important that this be done correctly and accurately. Recorded information will be used to determine the next step in treatment.  Pain evaluated is that of treated area only. Do not include pain from an untreated area.  Complete every hour, on the hour, for the initial 8 hours. Set an alarm to help you do this part accurately.  Do not go to sleep and have it completed later. It will not be accurate.  Follow-up appointment: Keep your follow-up appointment after the procedure. Usually 2 weeks for most procedures. (6 weeks in the case of radiofrequency.) Bring you pain diary.   Expect:  From numbing medicine (AKA: Local Anesthetics): Numbness or decrease in pain.  Onset: Full effect within 15  minutes of injected.  Duration: It will depend on the type of local anesthetic used. On the average, 1 to 8 hours.   From steroids: Decrease in swelling or inflammation. Once inflammation is improved, relief of the pain will follow.  Onset of benefits: Depends on the amount of swelling present. The more swelling, the longer it will take for the benefits to be seen. In some cases, up to 10 days.  Duration: Steroids will stay in the system x 2 weeks. Duration of benefits will depend on multiple posibilities including persistent irritating factors.  Occasional side-effects: Facial flushing (red, warm cheeks) , cramps (if present, drink Gatorade and take over-the-counter Magnesium 450-500 mg once to twice a day).  From procedure: Some discomfort is to be expected once the numbing medicine wears off. This should be minimal if ice and heat are applied as instructed.  Call if:  You experience numbness and weakness that gets worse with time, as opposed to wearing off.  New onset bowel or bladder incontinence. (This applies to Spinal procedures only)  Emergency Numbers:  Durning business hours (Monday - Thursday, 8:00 AM - 4:00 PM) (Friday, 9:00 AM - 12:00 Noon): [phone]  After hours: [phone] ____________________________________________________________________________________________    ______________________________________________________________________________________________  Specialty Pain Scale  Introduction:  There are significant differences in how pain is reported. The word pain usually refers to physical pain, but it is also a common synonym of suffering. The medical community uses a scale from 0 (zero) to 10 (ten) to report pain level. Zero (0) is described as "no pain", while ten (10) is described as "the worse pain you can imagine". The problem with this  scale is that physical pain is reported along with suffering. Suffering refers to mental pain, or more often  yet it refers to any unpleasant feeling, emotion or aversion associated with the perception of harm or threat of harm. It is the psychological component of pain.  Pain Specialists prefer to separate the two components. The pain scale used by this practice is the Verbal Numerical Rating Scale (VNRS-11). This scale is for the physical pain only. DO NOT INCLUDE how your pain psychologically affects you. This scale is for adults 58 years of age and older. It has 11 (eleven) levels. The 1st level is 0/10. This means: "right now, I have no pain". In the context of pain management, it also means: "right now, my physical pain is under control with the current therapy".  General Information:  The scale should reflect your current level of pain. Unless you are specifically asked for the level of your worst pain, or your average pain. If you are asked for one of these two, then it should be understood that it is over the past 24 hours.  Levels 1 (one) through 5 (five) are described below, and can be treated as an outpatient. Ambulatory pain management facilities such as ours are more than adequate to treat these levels. Levels 6 (six) through 10 (ten) are also described below, however, these must be treated as a hospitalized patient. While levels 6 (six) and 7 (seven) may be evaluated at an urgent care facility, levels 8 (eight) through 10 (ten) constitute medical emergencies and as such, they belong in a hospital's emergency department. When having these levels (as described below), do not come to our office. Our facility is not equipped to manage these levels. Go directly to an urgent care facility or an emergency department to be evaluated.  Definitions:  Activities of Daily Living (ADL): Activities of daily living (ADL or ADLs) is a term used in healthcare to refer to people's daily self-care activities. Health professionals often use a person's ability or inability to perform ADLs as a measurement of their  functional status, particularly in regard to people post injury, with disabilities and the elderly. There are two ADL levels: Basic and Instrumental. Basic Activities of Daily Living (BADL  or BADLs) consist of self-care tasks that include: Bathing and showering; personal hygiene and grooming (including brushing/combing/styling hair); dressing; Toilet hygiene (getting to the toilet, cleaning oneself, and getting back up); eating and self-feeding (not including cooking or chewing and swallowing); functional mobility, often referred to as "transferring", as measured by the ability to walk, get in and out of bed, and get into and out of a chair; the broader definition (moving from one place to another while performing activities) is useful for people with different physical abilities who are still able to get around independently. Basic ADLs include the things many people do when they get up in the morning and get ready to go out of the house: get out of bed, go to the toilet, bathe, dress, groom, and eat. On the average, loss of function typically follows a particular order. Hygiene is the first to go, followed by loss of toilet use and locomotion. The last to go is the ability to eat. When there is only one remaining area in which the person is independent, there is a 62.9% chance that it is eating and only a 3.5% chance that it is hygiene. Instrumental Activities of Daily Living (IADL or IADLs) are not necessary for fundamental functioning, but they let an  individual live independently in a community. IADL consist of tasks that include: cleaning and maintaining the house; home establishment and maintenance; care of others (including selecting and supervising caregivers); care of pets; child rearing; managing money; managing financials (investments, etc.); meal preparation and cleanup; shopping for groceries and necessities; moving within the community; safety procedures and emergency responses; health management  and maintenance (taking prescribed medications); and using the telephone or other form of communication.  Instructions:  Most patients tend to report their pain as a combination of two factors, their physical pain and their psychosocial pain. This last one is also known as "suffering" and it is reflection of how physical pain affects you socially and psychologically. From now on, report them separately.  From this point on, when asked to report your pain level, report only your physical pain. Use the following table for reference.  Pain Clinic Pain Levels (0-5/10)  Pain Level Score  Description  No Pain 0   Mild pain 1 Nagging, annoying, but does not interfere with basic activities of daily living (ADL). Patients are able to eat, bathe, get dressed, toileting (being able to get on and off the toilet and perform personal hygiene functions), transfer (move in and out of bed or a chair without assistance), and maintain continence (able to control bladder and bowel functions). Blood pressure and heart rate are unaffected. A normal heart rate for a healthy adult ranges from 60 to 100 bpm (beats per minute).   Mild to moderate pain 2 Noticeable and distracting. Impossible to hide from other people. More frequent flare-ups. Still possible to adapt and function close to normal. It can be very annoying and may have occasional stronger flare-ups. With discipline, patients may get used to it and adapt.   Moderate pain 3 Interferes significantly with activities of daily living (ADL). It becomes difficult to feed, bathe, get dressed, get on and off the toilet or to perform personal hygiene functions. Difficult to get in and out of bed or a chair without assistance. Very distracting. With effort, it can be ignored when deeply involved in activities.   Moderately severe pain 4 Impossible to ignore for more than a few minutes. With effort, patients may still be able to manage work or participate in some social  activities. Very difficult to concentrate. Signs of autonomic nervous system discharge are evident: dilated pupils (mydriasis); mild sweating (diaphoresis); sleep interference. Heart rate becomes elevated (>115 bpm). Diastolic blood pressure (lower number) rises above 100 mmHg. Patients find relief in laying down and not moving.   Severe pain 5 Intense and extremely unpleasant. Associated with frowning face and frequent crying. Pain overwhelms the senses.  Ability to do any activity or maintain social relationships becomes significantly limited. Conversation becomes difficult. Pacing back and forth is common, as getting into a comfortable position is nearly impossible. Pain wakes you up from deep sleep. Physical signs will be obvious: pupillary dilation; increased sweating; goosebumps; brisk reflexes; cold, clammy hands and feet; nausea, vomiting or dry heaves; loss of appetite; significant sleep disturbance with inability to fall asleep or to remain asleep. When persistent, significant weight loss is observed due to the complete loss of appetite and sleep deprivation.  Blood pressure and heart rate becomes significantly elevated. Caution: If elevated blood pressure triggers a pounding headache associated with blurred vision, then the patient should immediately seek attention at an urgent or emergency care unit, as these may be signs of an impending stroke.    Emergency Department Pain Levels (6-10/10)  Emergency Room Pain 6 Severely limiting. Requires emergency care and should not be seen or managed at an outpatient pain management facility. Communication becomes difficult and requires great effort. Assistance to reach the emergency department may be required. Facial flushing and profuse sweating along with potentially dangerous increases in heart rate and blood pressure will be evident.   Distressing pain 7 Self-care is very difficult. Assistance is required to transport, or use restroom. Assistance to  reach the emergency department will be required. Tasks requiring coordination, such as bathing and getting dressed become very difficult.   Disabling pain 8 Self-care is no longer possible. At this level, pain is disabling. The individual is unable to do even the most "basic" activities such as walking, eating, bathing, dressing, transferring to a bed, or toileting. Fine motor skills are lost. It is difficult to think clearly.   Incapacitating pain 9 Pain becomes incapacitating. Thought processing is no longer possible. Difficult to remember your own name. Control of movement and coordination are lost.   The worst pain imaginable 10 At this level, most patients pass out from pain. When this level is reached, collapse of the autonomic nervous system occurs, leading to a sudden drop in blood pressure and heart rate. This in turn results in a temporary and dramatic drop in blood flow to the brain, leading to a loss of consciousness. Fainting is one of the body's self defense mechanisms. Passing out puts the brain in a calmed state and causes it to shut down for a while, in order to begin the healing process.    Summary: 1. Refer to this scale when providing [place] with your pain level. 2. Be accurate and careful when reporting your pain level. This will help with your care. 3. Over-reporting your pain level will lead to loss of credibility. 4. Even a level of 1/10 means that there is pain and will be treated at our facility. 5. High, inaccurate reporting will be documented as "Symptom Exaggeration", leading to loss of credibility and suspicions of possible secondary gains such as obtaining more narcotics, or wanting to appear disabled, for fraudulent reasons. 6. Only pain levels of 5 or below will be seen at our facility. 7. Pain levels of 6 and above will be sent to the Emergency Department and the appointment  cancelled. ______________________________________________________________________________________________

## 2018-09-09 ENCOUNTER — Telehealth: Payer: Self-pay

## 2018-09-09 NOTE — Telephone Encounter (Signed)
Post procedure phone call. Patient states she is doing good.  

## 2018-09-11 ENCOUNTER — Encounter: Payer: Self-pay | Admitting: Emergency Medicine

## 2018-09-11 ENCOUNTER — Other Ambulatory Visit: Payer: Self-pay

## 2018-09-11 ENCOUNTER — Emergency Department
Admission: EM | Admit: 2018-09-11 | Discharge: 2018-09-11 | Disposition: A | Discharge status: Home / Self Care | Payer: Medicare HMO | Attending: Emergency Medicine | Admitting: Emergency Medicine

## 2018-09-11 DIAGNOSIS — G4700 Insomnia, unspecified: Secondary | ICD-10-CM | POA: Diagnosis not present

## 2018-09-11 DIAGNOSIS — T426X5A Adverse effect of other antiepileptic and sedative-hypnotic drugs, initial encounter: Secondary | ICD-10-CM | POA: Diagnosis not present

## 2018-09-11 DIAGNOSIS — Y69 Unspecified misadventure during surgical and medical care: Secondary | ICD-10-CM | POA: Insufficient documentation

## 2018-09-11 DIAGNOSIS — Z87891 Personal history of nicotine dependence: Secondary | ICD-10-CM | POA: Diagnosis not present

## 2018-09-11 DIAGNOSIS — T43221A Poisoning by selective serotonin reuptake inhibitors, accidental (unintentional), initial encounter: Secondary | ICD-10-CM | POA: Diagnosis not present

## 2018-09-11 DIAGNOSIS — T887XXA Unspecified adverse effect of drug or medicament, initial encounter: Secondary | ICD-10-CM

## 2018-09-11 DIAGNOSIS — Z96651 Presence of right artificial knee joint: Secondary | ICD-10-CM | POA: Insufficient documentation

## 2018-09-11 DIAGNOSIS — Z923 Personal history of irradiation: Secondary | ICD-10-CM | POA: Insufficient documentation

## 2018-09-11 DIAGNOSIS — T50991A Poisoning by other drugs, medicaments and biological substances, accidental (unintentional), initial encounter: Secondary | ICD-10-CM | POA: Insufficient documentation

## 2018-09-11 DIAGNOSIS — Z8521 Personal history of malignant neoplasm of larynx: Secondary | ICD-10-CM | POA: Diagnosis not present

## 2018-09-11 DIAGNOSIS — Z96652 Presence of left artificial knee joint: Secondary | ICD-10-CM | POA: Diagnosis not present

## 2018-09-11 DIAGNOSIS — J449 Chronic obstructive pulmonary disease, unspecified: Secondary | ICD-10-CM | POA: Insufficient documentation

## 2018-09-11 DIAGNOSIS — Z85118 Personal history of other malignant neoplasm of bronchus and lung: Secondary | ICD-10-CM | POA: Diagnosis not present

## 2018-09-11 DIAGNOSIS — Z7982 Long term (current) use of aspirin: Secondary | ICD-10-CM | POA: Diagnosis not present

## 2018-09-11 DIAGNOSIS — Z9221 Personal history of antineoplastic chemotherapy: Secondary | ICD-10-CM | POA: Diagnosis not present

## 2018-09-11 DIAGNOSIS — Z79899 Other long term (current) drug therapy: Secondary | ICD-10-CM | POA: Diagnosis not present

## 2018-09-11 LAB — COMPREHENSIVE METABOLIC PANEL
ALT: 14 U/L (ref 0–44)
AST: 23 U/L (ref 15–41)
Albumin: 4 g/dL (ref 3.5–5.0)
Alkaline Phosphatase: 83 U/L (ref 38–126)
Anion gap: 14 (ref 5–15)
BILIRUBIN TOTAL: 0.6 mg/dL (ref 0.3–1.2)
BUN: 11 mg/dL (ref 6–20)
CO2: 25 mmol/L (ref 22–32)
Calcium: 9.5 mg/dL (ref 8.9–10.3)
Chloride: 102 mmol/L (ref 98–111)
Creatinine, Ser: 0.71 mg/dL (ref 0.44–1.00)
GFR calc Af Amer: >60 mL/min (ref >60)
Glucose, Bld: 129 mg/dL — ABNORMAL HIGH (ref 70–99)
POTASSIUM: 3.5 mmol/L (ref 3.5–5.1)
Sodium: 141 mmol/L (ref 135–145)
TOTAL PROTEIN: 7.3 g/dL (ref 6.5–8.1)

## 2018-09-11 LAB — CBC WITH DIFFERENTIAL/PLATELET
ABS IMMATURE GRANULOCYTES: 0.03 10*3/uL (ref 0.00–0.07)
BASOS PCT: 1 %
Basophils Absolute: 0.1 10*3/uL (ref 0.0–0.1)
EOS ABS: 0.1 10*3/uL (ref 0.0–0.5)
EOS PCT: 1 %
HCT: 39.3 % (ref 36.0–46.0)
Hemoglobin: 12.9 g/dL (ref 12.0–15.0)
Immature Granulocytes: 0 %
Lymphocytes Relative: 32 %
Lymphs Abs: 2.7 10*3/uL (ref 0.7–4.0)
MCH: 27.9 pg (ref 26.0–34.0)
MCHC: 32.8 g/dL (ref 30.0–36.0)
MCV: 85.1 fL (ref 80.0–100.0)
MONO ABS: 0.7 10*3/uL (ref 0.1–1.0)
Monocytes Relative: 8 %
NEUTROS ABS: 4.9 10*3/uL (ref 1.7–7.7)
Neutrophils Relative %: 58 %
PLATELETS: 303 10*3/uL (ref 150–400)
RBC: 4.62 MIL/uL (ref 3.87–5.11)
RDW: 13.6 % (ref 11.5–15.5)
WBC: 8.4 10*3/uL (ref 4.0–10.5)
nRBC: 0 % (ref 0.0–0.2)

## 2018-09-11 LAB — LACTIC ACID, PLASMA: LACTIC ACID, VENOUS: 1.8 mmol/L (ref 0.5–1.9)

## 2018-09-11 LAB — SALICYLATE LEVEL: Salicylate Lvl: <7 mg/dL (ref 2.8–30.0)

## 2018-09-11 LAB — URINALYSIS, COMPLETE (UACMP) WITH MICROSCOPIC
Bilirubin Urine: NEGATIVE
GLUCOSE, UA: NEGATIVE mg/dL
Ketones, ur: NEGATIVE mg/dL
Leukocytes, UA: NEGATIVE
NITRITE: NEGATIVE
PH: 6 (ref 5.0–8.0)
PROTEIN: NEGATIVE mg/dL
Specific Gravity, Urine: 1.003 — ABNORMAL LOW (ref 1.005–1.030)

## 2018-09-11 LAB — T4, FREE: Free T4: 1.12 ng/dL (ref 0.82–1.77)

## 2018-09-11 LAB — ETHANOL

## 2018-09-11 LAB — URINE DRUG SCREEN, QUALITATIVE (ARMC ONLY)
AMPHETAMINES, UR SCREEN: NOT DETECTED
Barbiturates, Ur Screen: NOT DETECTED
Benzodiazepine, Ur Scrn: NOT DETECTED
CANNABINOID 50 NG, UR ~~LOC~~: NOT DETECTED
Cocaine Metabolite,Ur ~~LOC~~: NOT DETECTED
MDMA (ECSTASY) UR SCREEN: NOT DETECTED
Methadone Scn, Ur: NOT DETECTED
OPIATE, UR SCREEN: NOT DETECTED
PHENCYCLIDINE (PCP) UR S: NOT DETECTED
Tricyclic, Ur Screen: NOT DETECTED

## 2018-09-11 LAB — TSH: TSH: 3.676 u[IU]/mL (ref 0.350–4.500)

## 2018-09-11 LAB — CK: Total CK: 48 U/L (ref 38–234)

## 2018-09-11 LAB — ACETAMINOPHEN LEVEL: Acetaminophen (Tylenol), Serum: <10 ug/mL — ABNORMAL LOW (ref 10–30)

## 2018-09-11 MED ORDER — LORAZEPAM 2 MG/ML IJ SOLN
4.0000 mg | Freq: Once | INTRAMUSCULAR | Status: AC
  Administered 2018-09-11: 4 mg via INTRAVENOUS
  Filled 2018-09-11: qty 2

## 2018-09-11 MED ORDER — LORAZEPAM 1 MG PO TABS: 2.0000 mg | ORAL_TABLET | Freq: Every evening | ORAL | 0 refills | Status: DC | PRN

## 2018-09-11 MED ORDER — SODIUM CHLORIDE 0.9 % IV BOLUS
1000.0000 mL | Freq: Once | INTRAVENOUS | Status: AC
  Administered 2018-09-11: 1000 mL via INTRAVENOUS

## 2018-09-11 MED ORDER — CYPROHEPTADINE HCL 4 MG PO TABS
4.0000 mg | ORAL_TABLET | Freq: Once | ORAL | Status: AC
  Administered 2018-09-11: 4 mg via ORAL
  Filled 2018-09-11: qty 1

## 2018-09-11 MED ORDER — LORAZEPAM 2 MG/ML IJ SOLN
2.0000 mg | Freq: Once | INTRAMUSCULAR | Status: AC
  Administered 2018-09-11: 2 mg via INTRAVENOUS
  Filled 2018-09-11: qty 1

## 2018-09-11 NOTE — Discharge Instructions (Signed)
Fortunately today all of your blood work was very reassuring.  I think your symptoms were most likely related to the Cymbalta that you recently began so please stop taking the Cymbalta and use the sleeping pills that I am prescribing only as needed for severe symptoms.  Please follow-up with Dr. Wynetta Emery your primary care this week for recheck and return to the emergency department sooner for any concerns whatsoever.  If you have recurrence of your insomnia and agitation is extremely important that you come back to get a recheck.  It was a pleasure to take care of you today, and thank you for coming to our emergency department.  If you have any questions or concerns before leaving please ask the nurse to grab me and I'm more than happy to go through your aftercare instructions again.  If you were prescribed any opioid pain medication today such as Norco, Vicodin, Percocet, morphine, hydrocodone, or oxycodone please make sure you do not drive when you are taking this medication as it can alter your ability to drive safely.  If you have any concerns once you are home that you are not improving or are in fact getting worse before you can make it to your follow-up appointment, please do not hesitate to call 911 and come back for further evaluation.  Darel Hong, MD  Results for orders placed or performed during the hospital encounter of 09/11/18  Urinalysis, Complete w Microscopic  Result Value Ref Range   Color, Urine STRAW (A) YELLOW   APPearance CLEAR (A) CLEAR   Specific Gravity, Urine 1.003 (L) 1.005 - 1.030   pH 6.0 5.0 - 8.0   Glucose, UA NEGATIVE NEGATIVE mg/dL   Hgb urine dipstick MODERATE (A) NEGATIVE   Bilirubin Urine NEGATIVE NEGATIVE   Ketones, ur NEGATIVE NEGATIVE mg/dL   Protein, ur NEGATIVE NEGATIVE mg/dL   Nitrite NEGATIVE NEGATIVE   Leukocytes, UA NEGATIVE NEGATIVE   RBC / HPF 0-5 0 - 5 RBC/hpf   WBC, UA 0-5 0 - 5 WBC/hpf   Bacteria, UA RARE (A) NONE SEEN   Squamous  Epithelial / LPF 0-5 0 - 5   Mucus PRESENT   Urine Drug Screen, Qualitative  Result Value Ref Range   Tricyclic, Ur Screen NONE DETECTED NONE DETECTED   Amphetamines, Ur Screen NONE DETECTED NONE DETECTED   MDMA (Ecstasy)Ur Screen NONE DETECTED NONE DETECTED   Cocaine Metabolite,Ur [place] NONE DETECTED NONE DETECTED   Opiate, Ur Screen NONE DETECTED NONE DETECTED   Phencyclidine (PCP) Ur S NONE DETECTED NONE DETECTED   Cannabinoid 50 Ng, Ur [place] NONE DETECTED NONE DETECTED   Barbiturates, Ur Screen NONE DETECTED NONE DETECTED   Benzodiazepine, Ur Scrn NONE DETECTED NONE DETECTED   Methadone Scn, Ur NONE DETECTED NONE DETECTED  Comprehensive metabolic panel  Result Value Ref Range   Sodium 141 135 - 145 mmol/L   Potassium 3.5 3.5 - 5.1 mmol/L   Chloride 102 98 - 111 mmol/L   CO2 25 22 - 32 mmol/L   Glucose, Bld 129 (H) 70 - 99 mg/dL   BUN 11 6 - 20 mg/dL   Creatinine, Ser 0.71 0.44 - 1.00 mg/dL   Calcium 9.5 8.9 - 10.3 mg/dL   Total Protein 7.3 6.5 - 8.1 g/dL   Albumin 4.0 3.5 - 5.0 g/dL   AST 23 15 - 41 U/L   ALT 14 0 - 44 U/L   Alkaline Phosphatase 83 38 - 126 U/L   Total Bilirubin 0.6 0.3 - 1.2  mg/dL   GFR calc non Af Amer >60 >60 mL/min   GFR calc Af Amer >60 >60 mL/min   Anion gap 14 5 - 15  CBC with Differential  Result Value Ref Range   WBC 8.4 4.0 - 10.5 K/uL   RBC 4.62 3.87 - 5.11 MIL/uL   Hemoglobin 12.9 12.0 - 15.0 g/dL   HCT 39.3 36.0 - 46.0 %   MCV 85.1 80.0 - 100.0 fL   MCH 27.9 26.0 - 34.0 pg   MCHC 32.8 30.0 - 36.0 g/dL   RDW 13.6 11.5 - 15.5 %   Platelets 303 150 - 400 K/uL   nRBC 0.0 0.0 - 0.2 %   Neutrophils Relative % 58 %   Neutro Abs 4.9 1.7 - 7.7 K/uL   Lymphocytes Relative 32 %   Lymphs Abs 2.7 0.7 - 4.0 K/uL   Monocytes Relative 8 %   Monocytes Absolute 0.7 0.1 - 1.0 K/uL   Eosinophils Relative 1 %   Eosinophils Absolute 0.1 0.0 - 0.5 K/uL   Basophils Relative 1 %   Basophils Absolute 0.1 0.0 - 0.1 K/uL   Immature Granulocytes 0 %   Abs  Immature Granulocytes 0.03 0.00 - 0.07 K/uL  Lactic acid, plasma  Result Value Ref Range   Lactic Acid, Venous 1.8 0.5 - 1.9 mmol/L  CK  Result Value Ref Range   Total CK 48 38 - 234 U/L  TSH  Result Value Ref Range   TSH 3.676 0.350 - 4.500 uIU/mL  T4, free  Result Value Ref Range   Free T4 1.12 0.82 - 1.77 ng/dL  Acetaminophen level  Result Value Ref Range   Acetaminophen (Tylenol), Serum <10 (L) 10 - 30 ug/mL  Salicylate level  Result Value Ref Range   Salicylate Lvl <7.1 2.8 - 30.0 mg/dL  Ethanol  Result Value Ref Range   Alcohol, Ethyl (B) <10 <10 mg/dL

## 2018-09-11 NOTE — ED Provider Notes (Signed)
[facility] Emergency Department Provider Note  ____________________________________________   First MD Initiated Contact with Patient 09/11/18 [phone]     (approximate)  I have reviewed the triage vital signs and the nursing notes.   HISTORY  Chief Complaint Insomnia   HPI Susan Houston is a 57 y.o. female who self presents to the emergency department with 3 days of insomnia, anxiety, tremulousness, and akinesthesia.  She has a complex past medical history including stage II lung cancer in remission, depression, and chronic pain.  She has taken Neurontin 300 mg by mouth 3 times a day for many years and about a week and a half ago her primary care physician dropped her from 300 mg 3 times a day to 300 mg at night.  About 3 days ago her dose was increased to 600 mg at night.  She was also begun on Cymbalta at that point and her trazodone was discontinued.  She said ever since the change in her medications she has been unable to sleep.  She feels "spastic" and has spontaneous jerking movements that she cannot control.  She is had no fevers or chills.  No chest pain.  Mild shortness of breath.  No diarrhea.  No dysuria.  Her symptoms seem to come on suddenly are severe constant and nothing seems to make them better or worse.  She has no history of thyroid dysfunction.    Past Medical History:  Diagnosis Date  . Anemia    vitamin b12 deficiency. no longer taking supplements  . Anxiety   . Arthritis   . Asthma   . Back pain   . Bruises easily   . Cancer ([place]) 2009   LARYNX CANCER - CHEMO / RADIATION (NO SURGERY)   . Chronic leg pain    BILATERAL  . COPD (chronic obstructive pulmonary disease) (HCC)    no inhalers for over 1 year  . Depression   . Difficulty sleeping   . Diverticulitis   . Dysrhythmia 04/2018   brady episodes. cleared by dr. Clayborn Bigness  . Facet syndrome, lumbar 03/28/2015  . Hemorrhoids   . History of kidney stones 04/2018   recently passed stone   . Hyperlipidemia   . Hypertension 2019   not on any treatment currently  . Incontinence of urine   . Laryngeal cancer ([place]) 2009  . Lung cancer ([place]) 04/2018   diagnosed via ct scan  . Migraine   . Migraine without aura and without status migrainosus, not intractable 09/11/2016  . Mild cognitive impairment 11/23/2017  . MRSA infection greater than 3 months ago    2008 right side of face  . Nerve damage    right leg. thinks this is from her TKR  . Nodule of lower lobe of right lung 04/22/2018  . Oxygen deficiency    2L/HS  . Wears dentures    full upper and lower    Patient Active Problem List   Diagnosis Date Noted  . Chronic chest wall pain following surgery (Right) 09/08/2018  . Intercostal pain (Right) 09/08/2018  . Intercostal neuralgia 09/08/2018  . 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 Chronic post-thoracotomy pain 08/29/2018  . Neurogenic pain 08/29/2018  . Vitamin D insufficiency 08/29/2018  . Rib pain (Primary Area of Pain) (Right) 08/18/2018  . Incisional pain (Secondary Area of Pain) 08/18/2018  . Chronic pain syndrome 08/18/2018  . Long term current use of opiate analgesic 08/18/2018  . Pharmacologic therapy 08/18/2018  . Disorder of skeletal system  08/18/2018  . Problems influencing health status 08/18/2018  . Controlled substance agreement signed 06/28/2018  . Primary cancer of right lower lobe of lung ([place]) 05/27/2018  . Lung cancer ([place]) 05/16/2018  . Laryngeal cancer ([place]) 04/22/2018  . Mass of lower lobe of right lung 04/22/2018  . Vitamin B12 deficiency 12/15/2017  . Frequent falls 11/23/2017  . Mild cognitive impairment 11/23/2017  . Migraine without aura and without status migrainosus, not intractable 09/11/2016  . Hyperlipidemia   . COPD (chronic obstructive pulmonary disease) ([place])   . Severe depression ([place]) 12/24/2015  . Chronic anxiety 12/24/2015  . Coarse tremors 12/24/2015  . Lumbar facet syndrome 03/28/2015  . DDD (degenerative  disc disease), lumbar 03/05/2015  . DDD (degenerative disc disease), lumbosacral 02/23/2015  . Sacroiliac joint dysfunction 02/23/2015  . Saphenous neuralgia 09/24/2014    Past Surgical History:  Procedure Laterality Date  . ABDOMINAL HYSTERECTOMY  1983  . BLADDER SUSPENSION  2014  . CATARACT EXTRACTION W/PHACO Left 05/11/2018   Procedure: CATARACT EXTRACTION PHACO AND INTRAOCULAR LENS PLACEMENT ([place]) LEFT;  Surgeon: Leandrew Koyanagi, MD;  Location: [place];  Service: Ophthalmology;  Laterality: Left;  PREFERS EARLY  . CATARACT EXTRACTION W/PHACO Right 08/11/2018   Procedure: CATARACT EXTRACTION PHACO AND INTRAOCULAR LENS PLACEMENT (IOC);  Surgeon: Leandrew Koyanagi, MD;  Location: [facility];  Service: Ophthalmology;  Laterality: Right;  CDE 5:02 Total Time 01:35 Fluid lot # [id number]  . CHOLECYSTECTOMY  1998  . COLONOSCOPY    . ESOPHAGOGASTRODUODENOSCOPY  2015  . ESOPHAGOGASTRODUODENOSCOPY (EGD) WITH PROPOFOL N/A 11/20/2016   Procedure: ESOPHAGOGASTRODUODENOSCOPY (EGD) WITH PROPOFOL;  Surgeon: Jonathon Bellows, MD;  Location: [facility];  Service: Endoscopy;  Laterality: N/A;  . EXCISION NEUROMA Bilateral 09/24/2014   Procedure: BILATERAL OPEN SAPHENOUS NEURECTOMIES AND OPEN LEFT PERIPATELLA OSETOPHYTECTOMY;  Surgeon: Mauri Pole, MD;  Location: [facility];  Service: Orthopedics;  Laterality: Bilateral;  . EYE SURGERY Left 05/11/2018   cataract extraction  . JOINT REPLACEMENT  2009/2010   BIL TOTAL KNEES  . THORACOTOMY/LOBECTOMY Right 05/16/2018   Procedure: THORACOTOMY/POSSIBLE LOBECTOMY;  Surgeon: Nestor Lewandowsky, MD;  Location: [facility];  Service: Thoracic;  Laterality: Right;  . TONSILLECTOMY    . VIDEO BRONCHOSCOPY N/A 05/16/2018   Procedure: PREOP  BRONCHOSCOPY;  Surgeon: Nestor Lewandowsky, MD;  Location: [facility];  Service: Thoracic;  Laterality: N/A;    Prior to Admission medications   Medication Sig Start Date End Date Taking? Authorizing Provider  albuterol (PROVENTIL  HFA;VENTOLIN HFA) 108 (90 Base) MCG/ACT inhaler Inhale 2 puffs into the lungs every 4 (four) hours as needed for wheezing or shortness of breath. 07/01/18  Yes Flora Lipps, MD  aspirin 81 MG tablet Take 81 mg by mouth daily.   Yes [provider]  DULoxetine (CYMBALTA) 30 MG capsule Take 1 capsule (30 mg total) by mouth daily. 09/05/18  Yes Cammie Sickle, MD  gabapentin (NEURONTIN) 300 MG capsule Take 1-3 capsules (300-900 mg total) by mouth 4 (four) times daily. Follow the written titration instructions. Patient taking differently: Take 600 mg by mouth at bedtime. Follow the written titration instructions. 08/29/18 11/27/18 Yes Milinda Pointer, MD  oxyCODONE ER [facility] ER) 9 MG C12A Take 9 mg by mouth 2 (two) times daily. 09/06/18 10/06/18 Yes Johnson, Megan P, DO  tiZANidine (ZANAFLEX) 4 MG tablet Take 2 mg by mouth 3 (three) times daily.   Yes [provider]  LORazepam (ATIVAN) 1 MG tablet Take 2 tablets (2 mg total) by mouth at bedtime as needed  for up to 7 days for anxiety or sleep. 09/11/18 09/18/18  Darel Hong, MD    Allergies Morphine and related; Adhesive [tape]; and Lexapro [escitalopram oxalate]  Family History  Problem Relation Age of Onset  . Asthma Mother   . Diabetes Mother   . Hyperlipidemia Mother   . Hypertension Mother   . Cirrhosis Mother        Non-alcoholic  . Arthritis Father   . Cancer Father        Bone  . Hyperlipidemia Father   . Hypertension Father   . Anxiety disorder Sister   . Depression Sister   . Kidney Stones Son   . Stroke Maternal Grandmother   . Breast cancer Maternal Grandmother 21  . Cancer Maternal Grandfather        lung  . Pneumonia Paternal Grandmother   . Alzheimer's disease Paternal Grandfather   . Diabetes Sister   . Gout Sister   . Hypertension Sister   . Alcohol abuse Brother   . Heart disease Brother        massive MI    Social History Social History   Tobacco Use  . Smoking status: Former  Smoker    Packs/day: 0.50    Types: Cigarettes    Last attempt to quit: 04/07/2016    Years since quitting: 2.4  . Smokeless tobacco: Never Used  . Tobacco comment: patient has not smoked x 10 days.06/28/17 chantix  Substance Use Topics  . Alcohol use: No    Alcohol/week: 0.0 standard drinks  . Drug use: No    Review of Systems Constitutional: No fever/chills Eyes: No visual changes. ENT: No sore throat. Cardiovascular: Denies chest pain. Respiratory: Positive for shortness of breath. Gastrointestinal: No abdominal pain.  No nausea, no vomiting.  No diarrhea.  No constipation. Genitourinary: Negative for dysuria. Musculoskeletal: Negative for back pain. Skin: Negative for rash. Neurological: Positive for spontaneous clonus   ____________________________________________   PHYSICAL EXAM:  VITAL SIGNS: ED Triage Vitals  Enc Vitals Group     BP 09/11/18 0405 (!) 172/94     Pulse Rate 09/11/18 0405 96     Resp 09/11/18 0405 17     Temp 09/11/18 0405 98.4 F (36.9 C)     Temp Source 09/11/18 0405 Oral     SpO2 09/11/18 0405 97 %     Weight 09/11/18 0406 191 lb (86.6 kg)     Height 09/11/18 0406 5\' 2"  (1.575 m)     Head Circumference --      Peak Flow --      Pain Score 09/11/18 0406 0     Pain Loc --      Pain Edu? --      Excl. in [place]? --     Constitutional: Alert and oriented x4 appears quite anxious Eyes: PERRL EOMI. pupils are 9 mm and brisk Head: Atraumatic. Nose: No congestion/rhinnorhea. Mouth/Throat: No trismus Neck: No stridor.  Thyroid not palpable.  No meningismus Cardiovascular: Normal rate, regular rhythm. Grossly normal heart sounds.  Good peripheral circulation. Respiratory: Normal respiratory effort.  No retractions. Lungs CTAB and moving good air Gastrointestinal: Soft nontender Musculoskeletal: No lower extremity edema   Neurologic: 2+ DTRs bilateral lower extremities.  2-3 beats of ankle clonus bilateral lower extremities.  She is hyperreflexic  in her upper extremities.  5 out of 5 strength in all 4.  She does have spontaneous myoclonus in her uppers and lowers along with hand tremors and tongue fasciculations. Skin:  Skin is warm, dry and intact. No rash noted. Psychiatric: Very anxious   ____________________________________________   DIFFERENTIAL includes but not limited to  Alcohol withdrawal, benzodiazepine withdrawal, serotonin toxicity, thyrotoxicosis ____________________________________________   LABS (all labs ordered are listed, but only abnormal results are displayed)  Labs Reviewed  URINALYSIS, COMPLETE (UACMP) WITH MICROSCOPIC - Abnormal; Notable for the following components:      Result Value   Color, Urine STRAW (*)    APPearance CLEAR (*)    Specific Gravity, Urine 1.003 (*)    Hgb urine dipstick MODERATE (*)    Bacteria, UA RARE (*)    All other components within normal limits  COMPREHENSIVE METABOLIC PANEL - Abnormal; Notable for the following components:   Glucose, Bld 129 (*)    All other components within normal limits  ACETAMINOPHEN LEVEL - Abnormal; Notable for the following components:   Acetaminophen (Tylenol), Serum <10 (*)    All other components within normal limits  URINE DRUG SCREEN, QUALITATIVE ([facility] ONLY)  CBC WITH DIFFERENTIAL/PLATELET  LACTIC ACID, PLASMA  CK  TSH  T4, FREE  SALICYLATE LEVEL  ETHANOL    Lab work reviewed by me with no acute disease noted __________________________________________  EKG  ED ECG REPORT I, Darel Hong, the attending physician, personally viewed and interpreted this ECG.  Date: 09/11/2018 EKG Time:  Rate: 81 Rhythm: normal sinus rhythm QRS Axis: normal Intervals: normal ST/T Wave abnormalities: normal Narrative Interpretation: no evidence of acute ischemia  ____________________________________________  RADIOLOGY   ____________________________________________   PROCEDURES  Procedure(s) performed: no  .Critical Care Performed  by: Darel Hong, MD Authorized by: Darel Hong, MD   Critical care provider statement:    Critical care time (minutes):  30   Critical care time was exclusive of:  Separately billable procedures and treating other patients   Critical care was necessary to treat or prevent imminent or life-threatening deterioration of the following conditions:  [place]   Critical care was time spent personally by me on the following activities:  Development of treatment plan with patient or surrogate, discussions with consultants, evaluation of patient's response to treatment, examination of patient, obtaining history from patient or surrogate, ordering and performing treatments and interventions, ordering and review of laboratory studies, ordering and review of radiographic studies, pulse oximetry, re-evaluation of patient's condition and review of old charts    Critical Care performed: Yes  ____________________________________________   INITIAL IMPRESSION / ASSESSMENT AND PLAN / ED COURSE  Pertinent labs & imaging results that were available during my care of the patient were reviewed by me and considered in my medical decision making (see chart for details).   As part of my medical decision making, I reviewed the following data within the [place] History obtained from family if available, nursing notes, old chart and ekg, as well as notes from prior ED visits.  The patient presented to the emergency department with 3 days of insomnia.  She has spontaneous myoclonus along with hyper reflexivity.  She was not tachycardic however was very anxious appearing and tremulous.  My differential most notably would be serotonin toxicity although not true serotonin syndrome, withdrawal syndrome, or thyrotoxicosis.  I discussed the case with [facility] control who agreed with broad labs including ethanol salicylate and acetaminophen and to begin treatment with benzodiazepines.  I gave the  patient 1 L of normal saline along with 2 mg of IV lorazepam and an oral dose of cyproheptadine.  After an hour she had no change  in her symptoms and still felt quite anxious and had [place].  I then gave her 4 mg of IV lorazepam and she was able to get about 2 hours of sleep.  Her lab work came back unremarkable.  I then was able to wake the patient up and she said she felt significantly improved.  I offered the patient inpatient admission for continued supportive care however she declined stating she would prefer to go home with her husband driving which I think is actually quite reasonable.  I think at this point she most likely had serotonin toxicity secondary to Cymbalta so I have advised her to stop taking the Cymbalta and to continue her gabapentin as prescribed.  I will give her 9 tablets of Ativan to help with sleep for this coming week but she understands to follow-up with her primary care physician within 1 week for recheck.  Strict return precautions have been given and the patient understands that if she remains symptomatic later on this afternoon and evening to come back to the hospital and we will admit her.  At this point she is medically stable for outpatient management.      ____________________________________________   FINAL CLINICAL IMPRESSION(S) / ED DIAGNOSES  Final diagnoses:  Insomnia, unspecified type  Non-dose-related adverse effect of medication, initial encounter  Serotonin neurotoxicity, accidental or unintentional, initial encounter      NEW MEDICATIONS STARTED DURING THIS VISIT:  New Prescriptions   LORAZEPAM (ATIVAN) 1 MG TABLET    Take 2 tablets (2 mg total) by mouth at bedtime as needed for up to 7 days for anxiety or sleep.     Note:  This document was prepared using Dragon voice recognition software and may include unintentional dictation errors.     Darel Hong, MD 09/11/18 [phone]

## 2018-09-11 NOTE — ED Triage Notes (Addendum)
Pt to stat desk with steady gait stating she feels like she's on "acid or speed"; says she hasn't slept in 3 days, decreased appetite and has lost 7 pounds; feels jittery; restless in triage; pt says she wonders if the pharmacy messed up her medications; pt denies illegal drug use; pupils dilated; denies pain; pt anxious in triage; pt says she was seen at the pain clinic on Thursday; her doctor changed her gabapentin from 300mg  TID to 600mg  QHS; she says also on Thursday she started taking Cymbalta that was added by her oncologist at her visit there on Monday;

## 2018-09-12 ENCOUNTER — Ambulatory Visit: Payer: Self-pay | Admitting: *Deleted

## 2018-09-12 ENCOUNTER — Telehealth: Payer: Self-pay | Admitting: *Deleted

## 2018-09-12 ENCOUNTER — Telehealth: Payer: Self-pay | Admitting: Internal Medicine

## 2018-09-12 MED ORDER — TRAZODONE HCL 50 MG PO TABS: 25.0000 mg | ORAL_TABLET | Freq: Every evening | ORAL | 3 refills | Status: DC | PRN

## 2018-09-12 MED ORDER — QUETIAPINE FUMARATE 25 MG PO TABS: 25.0000 mg | ORAL_TABLET | Freq: Every day | ORAL | 1 refills | Status: DC

## 2018-09-12 NOTE — Telephone Encounter (Signed)
Call to patient to inform her that her PCP Dr Wynetta Emery has agreed to handle this problem for her

## 2018-09-12 NOTE — Telephone Encounter (Signed)
Hayley- Spoke to patient's PCP Dr. Wynetta Emery who kindly agrees to manage patient's anxiety/insomnia issues. Please inform pt.  Thanks GB

## 2018-09-12 NOTE — Telephone Encounter (Signed)
Patient called in tears stating she has not been able to sleep at night since starting Cymbalta. When I look at her chart, I see she went to ER and told them her PCP has changed several of her medications including stopping her Trazodone and reducing her Gabapentin dose. The ER doc gave her Lorazepam nightly for 7 days  Chief Complaint Insomnia   INITIAL IMPRESSION / ASSESSMENT AND PLAN / ED COURSE  Pertinent labs & imaging results that were available during my care of the patient were reviewed by me and considered in my medical decision making (see chart for details).  As part of my medical decision making, I reviewed the following data within the [place] obtained from family if available, nursing notes,old chart and ekg, as well as notes from prior ED visits.  The patient presented to the emergency department with 3 days of insomnia.  She has spontaneous myoclonus along with hyper reflexivity.  She was not tachycardic however was very anxious appearing and tremulous.  My differential most notably would be serotonin toxicity although not true serotonin syndrome, withdrawal syndrome, or thyrotoxicosis.  I discussed the case with [facility] control who agreed with broad labs including ethanol salicylate and acetaminophen and to begin treatment with benzodiazepines.  I gave the patient 1 L of normal saline along with 2 mg of IV lorazepam and an oral dose of cyproheptadine.  After an hour she had no change in her symptoms and still felt quite anxious and had [place].  I then gave her 4 mg of IV lorazepam and she was able to get about 2 hours of sleep.  Her lab work came back unremarkable.  I then was able to wake the patient up and she said she felt significantly improved.  I offered the patient inpatient admission for continued supportive care however she declined stating she would prefer to go home with her husband driving which I think is actually quite  reasonable.  I think at this point she most likely had serotonin toxicity secondary to Cymbalta so I have advised her to stop taking the Cymbalta and to continue her gabapentin as prescribed.  I will give her 9 tablets of Ativan to help with sleep for this coming week but she understands to follow-up with her primary care physician within 1 week for recheck.  Strict return precautions have been given and the patient understands that if she remains symptomatic later on this afternoon and evening to come back to the hospital and we will admit her.  At this point she is medically stable for outpatient management.    ____________________________________________   FINAL CLINICAL IMPRESSION(S) / ED DIAGNOSES  Final diagnoses:  Insomnia, unspecified type  Non-dose-related adverse effect of medication, initial encounter  Serotonin neurotoxicity, accidental or unintentional

## 2018-09-12 NOTE — Telephone Encounter (Signed)
Patient notified, appt scheduled.

## 2018-09-12 NOTE — Telephone Encounter (Signed)
Patient feels like she is going to jump out of her skin- she has been feeling that way since Thursday. Patient started on Cymbalta on Thursday- prescribed by oncology. Patient feels jittery and that she can't sleep. Patient was seen at the hospital yesterday. She was given Lorazapam and the medication did help her some. Patient did not sleep at all last night.  Reason for Disposition . Caller has URGENT medication question about med that PCP prescribed and triager unable to answer question  Answer Assessment - Initial Assessment Questions 1. SYMPTOMS: "Do you have any symptoms?"     Patient feels that the symptoms are about the same as when she went to the hospital. 2. SEVERITY: If symptoms are present, ask "Are they mild, moderate or severe?"     severe  Protocols used: MEDICATION QUESTION CALL-A-AH  Spoke to patient- explained she needs to let her oncologist know that she had this reaction. She also may get some good advisement from pharmacist on how long the medication will take to work it's way out of her system. Advised if she has to go back to hospital- she needs to let provider's know that they need to notify PCP regarding controled substances before they give them. She understands. Reassured that this will get better- patient was calmer after talking.

## 2018-09-12 NOTE — Telephone Encounter (Signed)
See other Telephone encounter

## 2018-09-12 NOTE — Telephone Encounter (Signed)
Talked with Dr. B who asked [place] to take care of her anxiety and insomnia as we'll be monitoring her more closely.  STOP cymbalta and lorazepam. Go back to her QID dosing on her gabapentin. Restart trazodone and will start seroquel at bedtime. We will see her on Monday.

## 2018-09-14 ENCOUNTER — Ambulatory Visit: Payer: Self-pay

## 2018-09-14 NOTE — Telephone Encounter (Signed)
She can take 100mg  on her seroquel and I'll see her monday

## 2018-09-14 NOTE — Telephone Encounter (Signed)
  Pt states that the QUEtiapine (SEROQUEL) 25 MG tablet is not helping her and she would like to see if something else can be sent in. She is unable to rest at night. Pt stated that she has been having trouble sleeping "at any time" since last Wednesday. Pt stated that she has had trouble sleeping before "but not like this." Pt denies stress being a factor. Denies pain, denies caffeine use, illegal drugs or alcohol. Pt stated she is very tired. Pt did not want to come in for an office visit stating, "I have an appointment on Monday."  Routing to office for review.  Reason for Disposition . Requesting medication for sleep ("sleeping pill")  Answer Assessment - Initial Assessment Questions 1. DESCRIPTION: "Tell me about your sleeping problem."      Unable to sleep at night or any time. Pt taking seroquel but is still unable to sleep 2. ONSET: "How long have you been having trouble sleeping?" (e.g., days, weeks, months)     Last Wednesday 3. RECURRENT: "Have you had sleeping problems before?"  If yes: "What happened that time?" "What helped your sleeping problem go away in the past?"      Nothing like this 4. STRESS: "Is there anything in your life that is making you feel stressed or tense?"     no 5. PAIN: "Do you have any pain that is keeping you awake?" (e.g., back pain, headache, abdominal pain)     no 6. CAFFEINE ABUSE: "Do you drink caffeinated beverages, and how much each day?" (e.g., coffee, tea, colas)     no 7. SUBSTANCE ABUSE: "Do you use any illegal drugs or alcohol?"     no 8. OTHER SYMPTOMS: "Do you have any other symptoms?"  (e.g., difficulty breathing)     No tired fatigue  Protocols used: INSOMNIA-A-AH

## 2018-09-14 NOTE — Telephone Encounter (Signed)
Patient notified

## 2018-09-16 ENCOUNTER — Other Ambulatory Visit: Payer: Self-pay

## 2018-09-16 ENCOUNTER — Encounter: Payer: Self-pay | Admitting: Family Medicine

## 2018-09-16 ENCOUNTER — Ambulatory Visit (INDEPENDENT_AMBULATORY_CARE_PROVIDER_SITE_OTHER): Payer: Medicare HMO | Admitting: Family Medicine

## 2018-09-16 VITALS — BP 138/87 | HR 98 | Temp 98.3°F | Ht 62.0 in | Wt 190.0 lb

## 2018-09-16 DIAGNOSIS — F5101 Primary insomnia: Secondary | ICD-10-CM

## 2018-09-16 MED ORDER — QUETIAPINE FUMARATE 100 MG PO TABS: 100.0000 mg | ORAL_TABLET | Freq: Every day | ORAL | 1 refills | Status: DC

## 2018-09-16 MED ORDER — CLONAZEPAM 1 MG PO TABS: 1.0000 mg | ORAL_TABLET | Freq: Every day | ORAL | 0 refills | Status: DC

## 2018-09-16 NOTE — Progress Notes (Signed)
BP 138/87   Pulse 98   Temp 98.3 F (36.8 C) (Oral)   Ht 5\' 2"  (1.575 m)   Wt 190 lb (86.2 kg)   SpO2 95%   BMI 34.75 kg/m    Subjective:    Patient ID: Susan Houston, female    DOB: Apr 24, 1961, 57 y.o.   MRN: [id number]  HPI: PA TENNANT is a 57 y.o. female  Chief Complaint  Patient presents with  . Insomnia   INSOMNIA- started on cymbalta by her oncologist to try to help with pain on 09/05/18- they discontinued her trazodone at that time. Started the cymbalta, but was in the ER on 09/11/18 with 3 days of insomnia. They were concerned in the ER about serotonin syndrome and treated her with ativan. She was able to sleep 2 hours in the ER at that time, and when she woke up she felt significantly better. Cymbalta was discontinued at that time. Has not taken any cymbalta since 09/11/18- was given 2mg  ativan to help with sleep over the week, but it didn't help at all. Was not able to sleep on it. She has been returned to her regular dose of gabapentin, her trazodone and started on seroquel to help with sleep. Avoiding ativan due to her use of opiates. As of 2 days ago, patient stated that she had still been having trouble sleeping. Her seroquel was increased to 100mg  qHS and she was told to follow up as scheduled on Monday (12/2). She is here today on Friday (11/29). She states that she has not been sleeping at all. Even took 2 extra of her seroquel with no benefit. Has been feeling very tired. No hallucinations. Has not been feeling stressed. Has not needed pain medicine since having her injection done at the pain clinic.  Duration: 8 days Satisfied with sleep quality: no Difficulty falling asleep: yes  Depression screen [facility] 2/9 09/16/2018 09/08/2018 08/29/2018 08/18/2018 07/13/2018  Decreased Interest 2 0 0 0 0  Down, Depressed, Hopeless 0 0 0 0 0  PHQ - 2 Score 2 0 0 0 0  Altered sleeping 3 - - - 1  Tired, decreased energy 3 - - - 1  Change in appetite 2 - - - 1  Feeling bad or failure  about yourself  0 - - - 0  Trouble concentrating 3 - - - 1  Moving slowly or fidgety/restless 0 - - - 0  Suicidal thoughts 0 - - - 0  PHQ-9 Score 13 - - - 4  Difficult doing work/chores Somewhat difficult - - - Not difficult at all  Some recent data might be hidden   GAD 7 : Generalized Anxiety Score 09/16/2018 07/13/2018 06/28/2018 04/15/2018  Nervous, Anxious, on Edge 3 3 1 1   Control/stop worrying 0 2 1 1   Worry too much - different things 0 2 1 1   Trouble relaxing 2 3 0 1  Restless 2 3 0 1  Easily annoyed or irritable 2 3 0 2  Afraid - awful might happen 0 3 3 1   Total GAD 7 Score 9 19 6 8   Anxiety Difficulty Very difficult Somewhat difficult Somewhat difficult Somewhat difficult    Relevant past medical, surgical, family and social history reviewed and updated as indicated. Interim medical history since our last visit reviewed. Allergies and medications reviewed and updated.  Review of Systems  Constitutional: Positive for fatigue. Negative for activity change, appetite change, chills, diaphoresis, fever and unexpected weight change.  Respiratory: Negative.  Cardiovascular: Negative.   Musculoskeletal: Negative.   Skin: Negative.   Neurological: Negative.   Psychiatric/Behavioral: Positive for sleep disturbance. Negative for agitation, behavioral problems, confusion, decreased concentration, dysphoric mood, hallucinations, self-injury and suicidal ideas. The patient is not nervous/anxious and is not hyperactive.     Per HPI unless specifically indicated above     Objective:    BP 138/87   Pulse 98   Temp 98.3 F (36.8 C) (Oral)   Ht 5\' 2"  (1.575 m)   Wt 190 lb (86.2 kg)   SpO2 95%   BMI 34.75 kg/m   Wt Readings from Last 3 Encounters:  09/16/18 190 lb (86.2 kg)  09/11/18 191 lb (86.6 kg)  09/08/18 191 lb (86.6 kg)    Physical Exam  Constitutional: She is oriented to person, place, and time. She appears well-developed and well-nourished. No distress.  HENT:    Head: Normocephalic and atraumatic.  Right Ear: Hearing normal.  Left Ear: Hearing normal.  Nose: Nose normal.  Eyes: Conjunctivae and lids are normal. Right eye exhibits no discharge. Left eye exhibits no discharge. No scleral icterus.  Cardiovascular: Normal rate, regular rhythm, normal heart sounds and intact distal pulses. Exam reveals no gallop and no friction rub.  No murmur heard. Pulmonary/Chest: Effort normal and breath sounds normal. No stridor. No respiratory distress. She has no wheezes. She has no rales. She exhibits no tenderness.  Musculoskeletal: Normal range of motion.  Neurological: She is alert and oriented to person, place, and time.  Skin: Skin is warm, dry and intact. Capillary refill takes less than 2 seconds. No rash noted. She is not diaphoretic. No erythema. No pallor.  Psychiatric: She has a normal mood and affect. Her speech is normal and behavior is normal. Judgment and thought content normal. Cognition and memory are normal.  Nursing note and vitals reviewed.   Results for orders placed or performed during the hospital encounter of 09/11/18  Urinalysis, Complete w Microscopic  Result Value Ref Range   Color, Urine STRAW (A) YELLOW   APPearance CLEAR (A) CLEAR   Specific Gravity, Urine 1.003 (L) 1.005 - 1.030   pH 6.0 5.0 - 8.0   Glucose, UA NEGATIVE NEGATIVE mg/dL   Hgb urine dipstick MODERATE (A) NEGATIVE   Bilirubin Urine NEGATIVE NEGATIVE   Ketones, ur NEGATIVE NEGATIVE mg/dL   Protein, ur NEGATIVE NEGATIVE mg/dL   Nitrite NEGATIVE NEGATIVE   Leukocytes, UA NEGATIVE NEGATIVE   RBC / HPF 0-5 0 - 5 RBC/hpf   WBC, UA 0-5 0 - 5 WBC/hpf   Bacteria, UA RARE (A) NONE SEEN   Squamous Epithelial / LPF 0-5 0 - 5   Mucus PRESENT   Urine Drug Screen, Qualitative  Result Value Ref Range   Tricyclic, Ur Screen NONE DETECTED NONE DETECTED   Amphetamines, Ur Screen NONE DETECTED NONE DETECTED   MDMA (Ecstasy)Ur Screen NONE DETECTED NONE DETECTED   Cocaine  Metabolite,Ur [place] NONE DETECTED NONE DETECTED   Opiate, Ur Screen NONE DETECTED NONE DETECTED   Phencyclidine (PCP) Ur S NONE DETECTED NONE DETECTED   Cannabinoid 50 Ng, Ur [place] NONE DETECTED NONE DETECTED   Barbiturates, Ur Screen NONE DETECTED NONE DETECTED   Benzodiazepine, Ur Scrn NONE DETECTED NONE DETECTED   Methadone Scn, Ur NONE DETECTED NONE DETECTED  Comprehensive metabolic panel  Result Value Ref Range   Sodium 141 135 - 145 mmol/L   Potassium 3.5 3.5 - 5.1 mmol/L   Chloride 102 98 - 111 mmol/L   CO2 25  22 - 32 mmol/L   Glucose, Bld 129 (H) 70 - 99 mg/dL   BUN 11 6 - 20 mg/dL   Creatinine, Ser 0.71 0.44 - 1.00 mg/dL   Calcium 9.5 8.9 - 10.3 mg/dL   Total Protein 7.3 6.5 - 8.1 g/dL   Albumin 4.0 3.5 - 5.0 g/dL   AST 23 15 - 41 U/L   ALT 14 0 - 44 U/L   Alkaline Phosphatase 83 38 - 126 U/L   Total Bilirubin 0.6 0.3 - 1.2 mg/dL   GFR calc non Af Amer >60 >60 mL/min   GFR calc Af Amer >60 >60 mL/min   Anion gap 14 5 - 15  CBC with Differential  Result Value Ref Range   WBC 8.4 4.0 - 10.5 K/uL   RBC 4.62 3.87 - 5.11 MIL/uL   Hemoglobin 12.9 12.0 - 15.0 g/dL   HCT 39.3 36.0 - 46.0 %   MCV 85.1 80.0 - 100.0 fL   MCH 27.9 26.0 - 34.0 pg   MCHC 32.8 30.0 - 36.0 g/dL   RDW 13.6 11.5 - 15.5 %   Platelets 303 150 - 400 K/uL   nRBC 0.0 0.0 - 0.2 %   Neutrophils Relative % 58 %   Neutro Abs 4.9 1.7 - 7.7 K/uL   Lymphocytes Relative 32 %   Lymphs Abs 2.7 0.7 - 4.0 K/uL   Monocytes Relative 8 %   Monocytes Absolute 0.7 0.1 - 1.0 K/uL   Eosinophils Relative 1 %   Eosinophils Absolute 0.1 0.0 - 0.5 K/uL   Basophils Relative 1 %   Basophils Absolute 0.1 0.0 - 0.1 K/uL   Immature Granulocytes 0 %   Abs Immature Granulocytes 0.03 0.00 - 0.07 K/uL  Lactic acid, plasma  Result Value Ref Range   Lactic Acid, Venous 1.8 0.5 - 1.9 mmol/L  CK  Result Value Ref Range   Total CK 48 38 - 234 U/L  TSH  Result Value Ref Range   TSH 3.676 0.350 - 4.500 uIU/mL  T4, free  Result  Value Ref Range   Free T4 1.12 0.82 - 1.77 ng/dL  Acetaminophen level  Result Value Ref Range   Acetaminophen (Tylenol), Serum <10 (L) 10 - 30 ug/mL  Salicylate level  Result Value Ref Range   Salicylate Lvl <6.7 2.8 - 30.0 mg/dL  Ethanol  Result Value Ref Range   Alcohol, Ethyl (B) <10 <10 mg/dL      Assessment & Plan:   Problem List Items Addressed This Visit      Other   Primary insomnia - Primary    Significant concern for hypomania- patient is very tired, but has not been able to sleep more than 2 hours at the ER in 8 days. Has taken 50mg  trazodone, 600mg  gabapentin and 150mg  of seroquel without benefit. Has not been hallucinating. Not taking her pain medicine any more due to her injection helping her pain and not needing it any more. Did not benefit from 2mg  lorazepam at home, but will continue the 100mg  seroquel and gabapentin and add in 1mg  klonopin. Recheck on Monday. Will try to get her into see psychiatry for evaluation. Call with any concerns.       Relevant Orders   Ambulatory referral to Psychiatry       Follow up plan: Return Monday.

## 2018-09-16 NOTE — Assessment & Plan Note (Signed)
Significant concern for hypomania- patient is very tired, but has not been able to sleep more than 2 hours at the ER in 8 days. Has taken 50mg  trazodone, 600mg  gabapentin and 150mg  of seroquel without benefit. Has not been hallucinating. Not taking her pain medicine any more due to her injection helping her pain and not needing it any more. Did not benefit from 2mg  lorazepam at home, but will continue the 100mg  seroquel and gabapentin and add in 1mg  klonopin. Recheck on Monday. Will try to get her into see psychiatry for evaluation. Call with any concerns.

## 2018-09-19 ENCOUNTER — Ambulatory Visit: Payer: Medicare HMO | Admitting: Family Medicine

## 2018-09-22 DIAGNOSIS — C3490 Malignant neoplasm of unspecified part of unspecified bronchus or lung: Secondary | ICD-10-CM | POA: Diagnosis not present

## 2018-09-22 DIAGNOSIS — Z96659 Presence of unspecified artificial knee joint: Secondary | ICD-10-CM | POA: Diagnosis not present

## 2018-09-23 DIAGNOSIS — Z961 Presence of intraocular lens: Secondary | ICD-10-CM | POA: Diagnosis not present

## 2018-09-25 NOTE — Progress Notes (Signed)
Patient's Name: Susan Houston  MRN: [id number]  Referring Provider: Valerie Roys, DO  DOB: 1961-05-10  PCP: Valerie Roys, DO  DOS: 09/28/2018  Note by: Gaspar Cola, MD  Service setting: Ambulatory outpatient  Specialty: Interventional Pain Management  Location: [facility] (AMB) Pain Management Facility    Patient type: Established   Primary Reason(s) for Visit: Encounter for post-procedure evaluation of chronic illness with mild to moderate exacerbation CC: Other (chest)  HPI  Susan Houston is a 57 y.o. year old, female patient, who comes today for a post-procedure evaluation. She has Saphenous neuralgia; DDD (degenerative disc disease), lumbosacral; Sacroiliac joint dysfunction; DDD (degenerative disc disease), lumbar; Lumbar facet syndrome; Severe depression ([place]); Chronic anxiety; Coarse tremors; Hyperlipidemia; COPD (chronic obstructive pulmonary disease) ([place]); Migraine without aura and without status migrainosus, not intractable; Frequent falls; Mild cognitive impairment; Vitamin B12 deficiency; Laryngeal cancer ([place]); Mass of lower lobe of right lung; Lung cancer ([place]); Primary cancer of right lower lobe of lung ([place]); Controlled substance agreement signed; Rib pain (Primary Area of Pain) (Right); Incisional pain (Secondary Area of Pain); Chronic pain syndrome; Long term current use of opiate analgesic; Pharmacologic therapy; Disorder of skeletal system; Problems influencing health status; Post-thoracotomy pain syndrome; Chronic post-thoracotomy pain; Neurogenic pain; Vitamin D insufficiency; Chronic chest wall pain following surgery (Right); Intercostal pain (Right); Intercostal neuralgia; and Primary insomnia on their problem list. Her primarily concern today is the Other (chest)  Pain Assessment: Location: Right Chest Radiating: Denies Onset: More than a month ago Duration: Chronic pain Quality: Burning, Throbbing Severity: 7 /10 (subjective, self-reported pain score)  Note: Reported  level is inconsistent with clinical observations. Clinically the patient looks like a 2/10 A 2/10 is viewed as "Mild to Moderate" and described as noticeable and distracting. Impossible to hide from other people. More frequent flare-ups. Still possible to adapt and function close to normal. It can be very annoying and may have occasional stronger flare-ups. With discipline, patients may get used to it and adapt. Susan Houston does not seem to understand the use of our objective pain scale When using our objective Pain Scale, levels between 6 and 10/10 are said to belong in an emergency room, as it progressively worsens from a 6/10, described as severely limiting, requiring emergency care not usually available at an outpatient pain management facility. At a 6/10 level, communication becomes difficult and requires great effort. Assistance to reach the emergency department may be required. Facial flushing and profuse sweating along with potentially dangerous increases in heart rate and blood pressure will be evident. Effect on ADL: limits my daily activities Timing: Constant Modifying factors: nothing BP: (!) 154/94  HR: 88  Susan Houston comes in today for post-procedure evaluation.  Further details on both, my assessment(s), as well as the proposed treatment plan, please see below.  Post-Procedure Assessment  09/08/2018 Procedure: Diagnostic right T5, T6, T7 intercostal nerve block #1 under fluoroscopic guidance and IV sedation  Pre-procedure pain score:  7/10 Post-procedure pain score: 0/10 (100% relief) Influential Factors: BMI: 31.09 kg/m Intra-procedural challenges: None observed.         Assessment challenges: None detected.              Reported side-effects: None.        Post-procedural adverse reactions or complications: None reported         Sedation: Sedation provided. When no sedatives are used, the analgesic levels obtained are directly associated to the effectiveness of the local  anesthetics. However, when sedation  is provided, the level of analgesia obtained during the initial 1 hour following the intervention, is believed to be the result of a combination of factors. These factors may include, but are not limited to: 1. The effectiveness of the local anesthetics used. 2. The effects of the analgesic(s) and/or anxiolytic(s) used. 3. The degree of discomfort experienced by the patient at the time of the procedure. 4. The patients ability and reliability in recalling and recording the events. 5. The presence and influence of possible secondary gains and/or psychosocial factors. Reported result: Relief experienced during the 1st hour after the procedure: 100 % (Ultra-Short Term Relief)            Interpretative annotation: Clinically appropriate result. Analgesia during this period is likely to be Local Anesthetic and/or IV Sedative (Analgesic/Anxiolytic) related.          Effects of local anesthetic: The analgesic effects attained during this period are directly associated to the localized infiltration of local anesthetics and therefore cary significant diagnostic value as to the etiological location, or anatomical origin, of the pain. Expected duration of relief is directly dependent on the pharmacodynamics of the local anesthetic used. Long-acting (4-6 hours) anesthetics used.  Reported result: Relief during the next 4 to 6 hour after the procedure: 100 % (Short-Term Relief)            Interpretative annotation: Clinically appropriate result. Analgesia during this period is likely to be Local Anesthetic-related.          Long-term benefit: Defined as the period of time past the expected duration of local anesthetics (1 hour for short-acting and 4-6 hours for long-acting). With the possible exception of prolonged sympathetic blockade from the local anesthetics, benefits during this period are typically attributed to, or associated with, other factors such as analgesic sensory  neuropraxia, antiinflammatory effects, or beneficial biochemical changes provided by agents other than the local anesthetics.  Reported result: Extended relief following procedure: 100 %(lasted for 17 days) (Long-Term Relief)            Interpretative annotation: Clinically possible results. Good relief. No permanent benefit expected. Inflammation plays a part in the etiology to the pain.          Current benefits: Defined as reported results that persistent at this point in time.   Analgesia: >50 % Susan Houston reports improvement of axial symptoms. Function: Somewhat improved ROM: Somewhat improved Interpretative annotation: Ongoing benefit. Therapeutic benefit observed. Results would suggest further treatment needed.          Interpretation: Results would suggest a successful diagnostic intervention.                  Plan:  Proceed with diagnostic procedure No.: 2          Laboratory Chemistry  Inflammation Markers (CRP: Acute Phase) (ESR: Chronic Phase) Lab Results  Component Value Date   CRP 6 08/18/2018   ESRSEDRATE 45 (H) 08/18/2018   LATICACIDVEN 1.8 09/11/2018                         Rheumatology Markers No results found.  Renal Markers Lab Results  Component Value Date   BUN 11 09/11/2018   CREATININE 0.71 09/11/2018   BCR 9 12/03/2017   GFRAA >60 09/11/2018   GFRNONAA >60 09/11/2018  Hepatic Markers Lab Results  Component Value Date   AST 23 09/11/2018   ALT 14 09/11/2018   ALBUMIN 4.0 09/11/2018                        Neuropathy Markers Lab Results  Component Value Date   VITAMINB12 405 08/18/2018                        Hematology Parameters Lab Results  Component Value Date   INR 1.13 05/12/2018   LABPROT 14.4 05/12/2018   APTT 32 05/12/2018   PLT 303 09/11/2018   HGB 12.9 09/11/2018   HCT 39.3 09/11/2018                        CV Markers Lab Results  Component Value Date   CKTOTAL 48 09/11/2018   TROPONINI <0.03  03/02/2018                         Note: Lab results reviewed.  Recent Imaging Results   Results for orders placed in visit on 09/08/18  DG C-Arm 1-60 Min-No Report   Narrative Fluoroscopy was utilized by the requesting physician.  No radiographic  interpretation.    Interpretation Report: Fluoroscopy was used during the procedure to assist with needle guidance. The images were interpreted intraoperatively by the requesting physician.  Meds   Current Outpatient Medications:  .  albuterol (PROVENTIL HFA;VENTOLIN HFA) 108 (90 Base) MCG/ACT inhaler, Inhale 2 puffs into the lungs every 4 (four) hours as needed for wheezing or shortness of breath., Disp: 1 Inhaler, Rfl: 2 .  aspirin 81 MG tablet, Take 81 mg by mouth daily., Disp: , Rfl:  .  clonazePAM (KLONOPIN) 1 MG tablet, Take 1 tablet (1 mg total) by mouth at bedtime., Disp: 20 tablet, Rfl: 0 .  gabapentin (NEURONTIN) 300 MG capsule, Take 1-3 capsules (300-900 mg total) by mouth 4 (four) times daily. Follow the written titration instructions. (Patient taking differently: Take 600 mg by mouth at bedtime. Follow the written titration instructions.), Disp: 360 capsule, Rfl: 2 .  QUEtiapine (SEROQUEL) 100 MG tablet, Take 1 tablet (100 mg total) by mouth at bedtime., Disp: 30 tablet, Rfl: 1 .  tiZANidine (ZANAFLEX) 4 MG tablet, Take 2 mg by mouth 3 (three) times daily., Disp: , Rfl:  .  traZODone (DESYREL) 50 MG tablet, Take 0.5-1 tablets (25-50 mg total) by mouth at bedtime as needed for sleep., Disp: 30 tablet, Rfl: 3 No current facility-administered medications for this visit.   Facility-Administered Medications Ordered in Other Visits:  .  ipratropium-albuterol (DUONEB) 0.5-2.5 (3) MG/3ML nebulizer solution 3 mL, 3 mL, Nebulization, Q6H, Burns, Jennifer E, NP  ROS  Constitutional: Denies any fever or chills Gastrointestinal: No reported hemesis, hematochezia, vomiting, or acute GI distress Musculoskeletal: Denies any acute onset joint  swelling, redness, loss of ROM, or weakness Neurological: No reported episodes of acute onset apraxia, aphasia, dysarthria, agnosia, amnesia, paralysis, loss of coordination, or loss of consciousness  Allergies  Susan Houston is allergic to morphine and related; cymbalta [duloxetine hcl]; adhesive [tape]; and lexapro [escitalopram oxalate].  [place]  Drug: Susan Houston  reports that she does not use drugs. Alcohol:  reports that she does not drink alcohol. Tobacco:  reports that she quit smoking about 2 years ago. Her smoking use included cigarettes. She smoked 0.50 packs per day. She has never used smokeless  tobacco. Medical:  has a past medical history of Anemia, Anxiety, Arthritis, Asthma, Back pain, Bruises easily, Cancer ([place]) (2009), Chronic leg pain, COPD (chronic obstructive pulmonary disease) ([place]), Depression, Difficulty sleeping, Diverticulitis, Dysrhythmia (04/2018), Facet syndrome, lumbar (03/28/2015), Hemorrhoids, History of kidney stones (04/2018), Hyperlipidemia, Hypertension (2019), Incontinence of urine, Laryngeal cancer ([place]) (2009), Lung cancer ([place]) (04/2018), Migraine, Migraine without aura and without status migrainosus, not intractable (09/11/2016), Mild cognitive impairment (11/23/2017), MRSA infection greater than 3 months ago, Nerve damage, Nodule of lower lobe of right lung (04/22/2018), Oxygen deficiency, and Wears dentures. Surgical: Susan Houston  has a past surgical history that includes Bladder suspension (2014); Tonsillectomy; Excision neuroma (Bilateral, 09/24/2014); Esophagogastroduodenoscopy (2015); Colonoscopy; Esophagogastroduodenoscopy (egd) with propofol (N/A, 11/20/2016); Joint replacement (2009/2010); Abdominal hysterectomy (1983); Eye surgery (Left, 05/11/2018); Cholecystectomy (1998); Cataract extraction w/PHACO (Left, 05/11/2018); Thoracotomy/lobectomy (Right, 05/16/2018); Video bronchoscopy (N/A, 05/16/2018); and Cataract extraction w/PHACO (Right, 08/11/2018). Family: family history  includes Alcohol abuse in her brother; Alzheimer's disease in her paternal grandfather; Anxiety disorder in her sister; Arthritis in her father; Asthma in her mother; Breast cancer (age of onset: 64) in her maternal grandmother; Cancer in her father and maternal grandfather; Cirrhosis in her mother; Depression in her sister; Diabetes in her mother and sister; Gout in her sister; Heart disease in her brother; Hyperlipidemia in her father and mother; Hypertension in her father, mother, and sister; Kidney Stones in her son; Pneumonia in her paternal grandmother; Stroke in her maternal grandmother.  Constitutional Exam  General appearance: Well nourished, well developed, and well hydrated. In no apparent acute distress Vitals:   09/28/18 1343  BP: (!) 154/94  Pulse: 88  Temp: 98.1 F (36.7 C)  SpO2: 100%  Weight: 170 lb (77.1 kg)  Height: _0  (1.575 m)   BMI Assessment: Estimated body mass index is 31.09 kg/m as calculated from the following:   Height as of this encounter: _1  (1.575 m).   Weight as of this encounter: 170 lb (77.1 kg).  BMI interpretation table: BMI level Category Range association with higher incidence of chronic pain  <18 kg/m2 Underweight   18.5-24.9 kg/m2 Ideal body weight   25-29.9 kg/m2 Overweight Increased incidence by 20%  30-34.9 kg/m2 Obese (Class I) Increased incidence by 68%  35-39.9 kg/m2 Severe obesity (Class II) Increased incidence by 136%  >40 kg/m2 Extreme obesity (Class III) Increased incidence by 254%   Patient's current BMI Ideal Body weight  Body mass index is 31.09 kg/m. Ideal body weight: 50.1 kg (110 lb 7.2 oz) Adjusted ideal body weight: 60.9 kg (134 lb 4.3 oz)   BMI Readings from Last 4 Encounters:  09/28/18 31.09 kg/m  09/16/18 34.75 kg/m  09/11/18 34.93 kg/m  09/08/18 34.93 kg/m   Wt Readings from Last 4 Encounters:  09/28/18 170 lb (77.1 kg)  09/16/18 190 lb (86.2 kg)  09/11/18 191 lb (86.6 kg)  09/08/18 191 lb (86.6 kg)   Psych/Mental status: Alert, oriented x 3 (person, place, & time)       Eyes: PERLA Respiratory: No evidence of acute respiratory distress  Cervical Spine Area Exam  Skin & Axial Inspection: No masses, redness, edema, swelling, or associated skin lesions Alignment: Symmetrical Functional ROM: Unrestricted ROM      Stability: No instability detected Muscle Tone/Strength: Functionally intact. No obvious neuro-muscular anomalies detected. Sensory (Neurological): Unimpaired Palpation: No palpable anomalies              Upper Extremity (UE) Exam    Side: Right upper extremity  Side:  Left upper extremity  Skin & Extremity Inspection: Skin color, temperature, and hair growth are WNL. No peripheral edema or cyanosis. No masses, redness, swelling, asymmetry, or associated skin lesions. No contractures.  Skin & Extremity Inspection: Skin color, temperature, and hair growth are WNL. No peripheral edema or cyanosis. No masses, redness, swelling, asymmetry, or associated skin lesions. No contractures.  Functional ROM: Unrestricted ROM          Functional ROM: Unrestricted ROM          Muscle Tone/Strength: Functionally intact. No obvious neuro-muscular anomalies detected.  Muscle Tone/Strength: Functionally intact. No obvious neuro-muscular anomalies detected.  Sensory (Neurological): Unimpaired          Sensory (Neurological): Unimpaired          Palpation: No palpable anomalies              Palpation: No palpable anomalies              Provocative Test(s):  Phalen's test: deferred Tinel's test: deferred Apley's scratch test (touch opposite shoulder):  Action 1 (Across chest): deferred Action 2 (Overhead): deferred Action 3 (LB reach): deferred   Provocative Test(s):  Phalen's test: deferred Tinel's test: deferred Apley's scratch test (touch opposite shoulder):  Action 1 (Across chest): deferred Action 2 (Overhead): deferred Action 3 (LB reach): deferred    Thoracic Spine Area Exam  Skin &  Axial Inspection: No masses, redness, or swelling Alignment: Symmetrical Functional ROM: Unrestricted ROM Stability: No instability detected Muscle Tone/Strength: Functionally intact. No obvious neuro-muscular anomalies detected. Sensory (Neurological): Unimpaired Muscle strength & Tone: No palpable anomalies  Lumbar Spine Area Exam  Skin & Axial Inspection: No masses, redness, or swelling Alignment: Symmetrical Functional ROM: Unrestricted ROM       Stability: No instability detected Muscle Tone/Strength: Functionally intact. No obvious neuro-muscular anomalies detected. Sensory (Neurological): Unimpaired Palpation: No palpable anomalies       Provocative Tests: Hyperextension/rotation test: deferred today       Lumbar quadrant test (Kemp's test): deferred today       Lateral bending test: deferred today       Patrick's Maneuver: deferred today                   FABER* test: deferred today                   S-I anterior distraction/compression test: deferred today         S-I lateral compression test: deferred today         S-I Thigh-thrust test: deferred today         S-I Gaenslen's test: deferred today         *(Flexion, ABduction and External Rotation)  Gait & Posture Assessment  Ambulation: Unassisted Gait: Relatively normal for age and body habitus Posture: WNL   Lower Extremity Exam    Side: Right lower extremity  Side: Left lower extremity  Stability: No instability observed          Stability: No instability observed          Skin & Extremity Inspection: Skin color, temperature, and hair growth are WNL. No peripheral edema or cyanosis. No masses, redness, swelling, asymmetry, or associated skin lesions. No contractures.  Skin & Extremity Inspection: Skin color, temperature, and hair growth are WNL. No peripheral edema or cyanosis. No masses, redness, swelling, asymmetry, or associated skin lesions. No contractures.  Functional ROM: Unrestricted ROM  Functional ROM: Unrestricted ROM                  Muscle Tone/Strength: Functionally intact. No obvious neuro-muscular anomalies detected.  Muscle Tone/Strength: Functionally intact. No obvious neuro-muscular anomalies detected.  Sensory (Neurological): Unimpaired        Sensory (Neurological): Unimpaired        DTR: Patellar: deferred today Achilles: deferred today Plantar: deferred today  DTR: Patellar: deferred today Achilles: deferred today Plantar: deferred today  Palpation: No palpable anomalies  Palpation: No palpable anomalies   Assessment  Primary Diagnosis & Pertinent Problem List: The primary encounter diagnosis was Intercostal neuralgia. Diagnoses of Rib pain (Primary Area of Pain) (Right), Intercostal pain (Right), Chronic post-thoracotomy pain, Chronic chest wall pain following surgery (Right), and Incisional pain (Secondary Area of Pain) were also pertinent to this visit.  Status Diagnosis  Controlled Controlled Controlled 1. Intercostal neuralgia   2. Rib pain (Primary Area of Pain) (Right)   3. Intercostal pain (Right)   4. Chronic post-thoracotomy pain   5. Chronic chest wall pain following surgery (Right)   6. Incisional pain (Secondary Area of Pain)     Problems updated and reviewed during this visit: No problems updated. Plan of Care  Pharmacotherapy (Medications Ordered): No orders of the defined types were placed in this encounter.  Medications administered today: Susan Houston. Kindall had no medications administered during this visit.   Procedure Orders     INTERCOSTAL NERVE BLOCK Lab Orders  No laboratory test(s) ordered today   Imaging Orders  No imaging studies ordered today   Referral Orders  No referral(s) requested today   Interventional management options: Planned, scheduled, and/or pending:   Diagnostic right T5, T6, T7 intercostal nerve block #2 under fluoroscopic guidance and IV sedation    Considering:   Diagnostic right T5, T6, T7  intercostal nerve block  Possible right T5, T6, T7 intercostal nerve RFA    Palliative PRN treatment(s):   None at this time   Provider-requested follow-up: Return for Procedure (w/ sedation): (R) T5, T6, T7 Intercostal NB #2.  Future Appointments  Date Time Provider [place]  09/29/2018  1:00 PM Milinda Pointer, MD [facility] None  10/04/2018  3:45 PM Valerie Roys, DO [facility]  10/18/2018  3:30 PM Ursula Alert, MD [facility] None  11/04/2018 10:00 AM [facility]  11/07/2018  1:30 PM [facility] None  11/07/2018  2:00 PM Cammie Sickle, MD [facility] None   Primary Care Physician: Valerie Roys, DO Location: [facility] Note by: Gaspar Cola, MD Date: 09/28/2018; Time: 3:08 PM

## 2018-09-27 ENCOUNTER — Telehealth: Payer: Self-pay | Admitting: Pain Medicine

## 2018-09-27 DIAGNOSIS — C3490 Malignant neoplasm of unspecified part of unspecified bronchus or lung: Secondary | ICD-10-CM | POA: Diagnosis not present

## 2018-09-27 DIAGNOSIS — Z96659 Presence of unspecified artificial knee joint: Secondary | ICD-10-CM | POA: Diagnosis not present

## 2018-09-27 NOTE — Telephone Encounter (Signed)
Spoke with Susan Houston requires eval appt for documentation of pain relief in order to have additional procedure. Patient notified.

## 2018-09-27 NOTE — Telephone Encounter (Signed)
Patient called stating she does not have copay for follow up appt until 12-20. She would like to come in for procedure appt before christmas. Is this possible? She has [employer]. She has follow up scheduled 09-28-18. Please let patient know what she needs to do and if can have a procedure send to [place] for prior auth.

## 2018-09-27 NOTE — Telephone Encounter (Signed)
Pain in right side of the chest, requesting another ICNB. Had this on 09-08-18.

## 2018-09-28 ENCOUNTER — Telehealth: Payer: Self-pay | Admitting: Pain Medicine

## 2018-09-28 ENCOUNTER — Encounter: Payer: Self-pay | Admitting: Pain Medicine

## 2018-09-28 ENCOUNTER — Ambulatory Visit: Payer: Medicare HMO | Attending: Pain Medicine | Admitting: Pain Medicine

## 2018-09-28 ENCOUNTER — Other Ambulatory Visit: Payer: Self-pay

## 2018-09-28 VITALS — BP 154/94 | HR 88 | Temp 98.1°F | Ht 62.0 in | Wt 170.0 lb

## 2018-09-28 DIAGNOSIS — E559 Vitamin D deficiency, unspecified: Secondary | ICD-10-CM | POA: Diagnosis not present

## 2018-09-28 DIAGNOSIS — R0789 Other chest pain: Secondary | ICD-10-CM

## 2018-09-28 DIAGNOSIS — R0782 Intercostal pain: Secondary | ICD-10-CM | POA: Insufficient documentation

## 2018-09-28 DIAGNOSIS — K5792 Diverticulitis of intestine, part unspecified, without perforation or abscess without bleeding: Secondary | ICD-10-CM | POA: Insufficient documentation

## 2018-09-28 DIAGNOSIS — G894 Chronic pain syndrome: Secondary | ICD-10-CM | POA: Insufficient documentation

## 2018-09-28 DIAGNOSIS — Z888 Allergy status to other drugs, medicaments and biological substances status: Secondary | ICD-10-CM | POA: Insufficient documentation

## 2018-09-28 DIAGNOSIS — G8922 Chronic post-thoracotomy pain: Secondary | ICD-10-CM

## 2018-09-28 DIAGNOSIS — G8918 Other acute postprocedural pain: Secondary | ICD-10-CM | POA: Diagnosis not present

## 2018-09-28 DIAGNOSIS — G43009 Migraine without aura, not intractable, without status migrainosus: Secondary | ICD-10-CM | POA: Diagnosis not present

## 2018-09-28 DIAGNOSIS — G479 Sleep disorder, unspecified: Secondary | ICD-10-CM | POA: Insufficient documentation

## 2018-09-28 DIAGNOSIS — G588 Other specified mononeuropathies: Secondary | ICD-10-CM | POA: Diagnosis not present

## 2018-09-28 DIAGNOSIS — M792 Neuralgia and neuritis, unspecified: Secondary | ICD-10-CM | POA: Diagnosis present

## 2018-09-28 DIAGNOSIS — F419 Anxiety disorder, unspecified: Secondary | ICD-10-CM | POA: Insufficient documentation

## 2018-09-28 DIAGNOSIS — Z79899 Other long term (current) drug therapy: Secondary | ICD-10-CM | POA: Insufficient documentation

## 2018-09-28 DIAGNOSIS — Z90710 Acquired absence of both cervix and uterus: Secondary | ICD-10-CM | POA: Insufficient documentation

## 2018-09-28 DIAGNOSIS — Z9049 Acquired absence of other specified parts of digestive tract: Secondary | ICD-10-CM | POA: Insufficient documentation

## 2018-09-28 DIAGNOSIS — Z9889 Other specified postprocedural states: Secondary | ICD-10-CM | POA: Insufficient documentation

## 2018-09-28 DIAGNOSIS — M533 Sacrococcygeal disorders, not elsewhere classified: Secondary | ICD-10-CM | POA: Insufficient documentation

## 2018-09-28 DIAGNOSIS — L7682 Other postprocedural complications of skin and subcutaneous tissue: Secondary | ICD-10-CM | POA: Diagnosis not present

## 2018-09-28 DIAGNOSIS — C329 Malignant neoplasm of larynx, unspecified: Secondary | ICD-10-CM | POA: Insufficient documentation

## 2018-09-28 DIAGNOSIS — Z885 Allergy status to narcotic agent status: Secondary | ICD-10-CM | POA: Insufficient documentation

## 2018-09-28 DIAGNOSIS — Z87891 Personal history of nicotine dependence: Secondary | ICD-10-CM | POA: Insufficient documentation

## 2018-09-28 DIAGNOSIS — Z7982 Long term (current) use of aspirin: Secondary | ICD-10-CM | POA: Diagnosis not present

## 2018-09-28 DIAGNOSIS — Z79891 Long term (current) use of opiate analgesic: Secondary | ICD-10-CM | POA: Diagnosis not present

## 2018-09-28 DIAGNOSIS — D649 Anemia, unspecified: Secondary | ICD-10-CM | POA: Insufficient documentation

## 2018-09-28 DIAGNOSIS — R296 Repeated falls: Secondary | ICD-10-CM | POA: Insufficient documentation

## 2018-09-28 DIAGNOSIS — I10 Essential (primary) hypertension: Secondary | ICD-10-CM | POA: Diagnosis not present

## 2018-09-28 DIAGNOSIS — J449 Chronic obstructive pulmonary disease, unspecified: Secondary | ICD-10-CM | POA: Diagnosis not present

## 2018-09-28 DIAGNOSIS — M5136 Other intervertebral disc degeneration, lumbar region: Secondary | ICD-10-CM | POA: Diagnosis not present

## 2018-09-28 DIAGNOSIS — Z8521 Personal history of malignant neoplasm of larynx: Secondary | ICD-10-CM | POA: Insufficient documentation

## 2018-09-28 DIAGNOSIS — R0781 Pleurodynia: Secondary | ICD-10-CM

## 2018-09-28 DIAGNOSIS — F329 Major depressive disorder, single episode, unspecified: Secondary | ICD-10-CM | POA: Insufficient documentation

## 2018-09-28 DIAGNOSIS — E785 Hyperlipidemia, unspecified: Secondary | ICD-10-CM | POA: Insufficient documentation

## 2018-09-28 DIAGNOSIS — E538 Deficiency of other specified B group vitamins: Secondary | ICD-10-CM | POA: Diagnosis not present

## 2018-09-28 NOTE — Telephone Encounter (Signed)
Error

## 2018-09-28 NOTE — Patient Instructions (Signed)
____________________________________________________________________________________________  Preparing for Procedure with Sedation  Instructions: . Oral Intake: Do not eat or drink anything for at least 8 hours prior to your procedure. . Transportation: Public transportation is not allowed. Bring an adult driver. The driver must be physically present in our waiting room before any procedure can be started. Marland Kitchen Physical Assistance: Bring an adult physically capable of assisting you, in the event you need help. This adult should keep you company at home for at least 6 hours after the procedure. . Blood Pressure Medicine: Take your blood pressure medicine with a sip of water the morning of the procedure. . Blood thinners: Notify our staff if you are taking any blood thinners. Depending on which one you take, there will be specific instructions on how and when to stop it. . Diabetics on insulin: Notify the staff so that you can be scheduled 1st case in the morning. If your diabetes requires high dose insulin, take only  of your normal insulin dose the morning of the procedure and notify the staff that you have done so. . Preventing infections: Shower with an antibacterial soap the morning of your procedure. . Build-up your immune system: Take 1000 mg of Vitamin C with every meal (3 times a day) the day prior to your procedure. Marland Kitchen Antibiotics: Inform the staff if you have a condition or reason that requires you to take antibiotics before dental procedures. . Pregnancy: If you are pregnant, call and cancel the procedure. . Sickness: If you have a cold, fever, or any active infections, call and cancel the procedure. . Arrival: You must be in the facility at least 30 minutes prior to your scheduled procedure. . Children: Do not bring children with you. . Dress appropriately: Bring dark clothing that you would not mind if they get stained. . Valuables: Do not bring any jewelry or valuables.  Procedure  appointments are reserved for interventional treatments only. Marland Kitchen No Prescription Refills. . No medication changes will be discussed during procedure appointments. . No disability issues will be discussed.  Reasons to call and reschedule or cancel your procedure: (Following these recommendations will minimize the risk of a serious complication.) . Surgeries: Avoid having procedures within 2 weeks of any surgery. (Avoid for 2 weeks before or after any surgery). . Flu Shots: Avoid having procedures within 2 weeks of a flu shots or . (Avoid for 2 weeks before or after immunizations). . Barium: Avoid having a procedure within 7-10 days after having had a radiological study involving the use of radiological contrast. (Myelograms, Barium swallow or enema study). . Heart attacks: Avoid any elective procedures or surgeries for the initial 6 months after a "Myocardial Infarction" (Heart Attack). . Blood thinners: It is imperative that you stop these medications before procedures. Let us know if you if you take any blood thinner.  . Infection: Avoid procedures during or within two weeks of an infection (including chest colds or gastrointestinal problems). Symptoms associated with infections include: Localized redness, fever, chills, night sweats or profuse sweating, burning sensation when voiding, cough, congestion, stuffiness, runny nose, sore throat, diarrhea, nausea, vomiting, cold or Flu symptoms, recent or current infections. It is specially important if the infection is over the area that we intend to treat. Marland Kitchen Heart and lung problems: Symptoms that may suggest an active cardiopulmonary problem include: cough, chest pain, breathing difficulties or shortness of breath, dizziness, ankle swelling, uncontrolled high or unusually low blood pressure, and/or palpitations. If you are experiencing any of these symptoms, cancel  your procedure and contact your primary care physician for an evaluation.  Remember:   Regular Business hours are:  Monday to Thursday 8:00 AM to 4:00 PM  Provider's Schedule: Milinda Pointer, MD:  Procedure days: Tuesday and Thursday 7:30 AM to 4:00 PM  Gillis Santa, MD:  Procedure days: Monday and Wednesday 7:30 AM to 4:00 PM ____________________________________________________________________________________________

## 2018-09-29 ENCOUNTER — Ambulatory Visit
Admission: RE | Admit: 2018-09-29 | Discharge: 2018-09-29 | Disposition: A | Discharge status: Home / Self Care | Payer: Medicare HMO | Source: Ambulatory Visit | Attending: Pain Medicine | Admitting: Pain Medicine

## 2018-09-29 ENCOUNTER — Other Ambulatory Visit: Payer: Self-pay

## 2018-09-29 ENCOUNTER — Encounter: Payer: Self-pay | Admitting: Pain Medicine

## 2018-09-29 ENCOUNTER — Ambulatory Visit (HOSPITAL_BASED_OUTPATIENT_CLINIC_OR_DEPARTMENT_OTHER): Payer: Medicare HMO | Admitting: Pain Medicine

## 2018-09-29 VITALS — BP 132/70 | HR 102 | Temp 97.8°F | Resp 11 | Ht 62.0 in | Wt 187.8 lb

## 2018-09-29 DIAGNOSIS — R0789 Other chest pain: Secondary | ICD-10-CM | POA: Insufficient documentation

## 2018-09-29 DIAGNOSIS — G8918 Other acute postprocedural pain: Secondary | ICD-10-CM | POA: Insufficient documentation

## 2018-09-29 DIAGNOSIS — R0781 Pleurodynia: Secondary | ICD-10-CM

## 2018-09-29 DIAGNOSIS — G8922 Chronic post-thoracotomy pain: Secondary | ICD-10-CM | POA: Diagnosis not present

## 2018-09-29 DIAGNOSIS — G8912 Acute post-thoracotomy pain: Secondary | ICD-10-CM

## 2018-09-29 DIAGNOSIS — R0782 Intercostal pain: Secondary | ICD-10-CM | POA: Insufficient documentation

## 2018-09-29 DIAGNOSIS — G588 Other specified mononeuropathies: Secondary | ICD-10-CM | POA: Diagnosis not present

## 2018-09-29 DIAGNOSIS — L7682 Other postprocedural complications of skin and subcutaneous tissue: Secondary | ICD-10-CM | POA: Diagnosis not present

## 2018-09-29 MED ORDER — FENTANYL CITRATE (PF) 100 MCG/2ML IJ SOLN
25.0000 ug | INTRAMUSCULAR | Status: DC | PRN
  Administered 2018-09-29: 100 ug via INTRAVENOUS
  Filled 2018-09-29: qty 2

## 2018-09-29 MED ORDER — LACTATED RINGERS IV SOLN
1000.0000 mL | Freq: Once | INTRAVENOUS | Status: AC
  Administered 2018-09-29: 1000 mL via INTRAVENOUS

## 2018-09-29 MED ORDER — LIDOCAINE HCL 2 % IJ SOLN
20.0000 mL | Freq: Once | INTRAMUSCULAR | Status: AC
  Administered 2018-09-29: 400 mg
  Filled 2018-09-29: qty 40

## 2018-09-29 MED ORDER — MIDAZOLAM HCL 5 MG/5ML IJ SOLN
1.0000 mg | INTRAMUSCULAR | Status: DC | PRN
  Administered 2018-09-29: 5 mg via INTRAVENOUS
  Filled 2018-09-29: qty 5

## 2018-09-29 MED ORDER — DEXAMETHASONE SODIUM PHOSPHATE 10 MG/ML IJ SOLN
10.0000 mg | Freq: Once | INTRAMUSCULAR | Status: AC
  Administered 2018-09-29: 10 mg
  Filled 2018-09-29: qty 1

## 2018-09-29 MED ORDER — ROPIVACAINE HCL 2 MG/ML IJ SOLN
9.0000 mL | Freq: Once | INTRAMUSCULAR | Status: AC
  Administered 2018-09-29: 9 mL via PERINEURAL
  Filled 2018-09-29: qty 10

## 2018-09-29 NOTE — Progress Notes (Signed)
81/61 Patient's Name: Susan Houston  MRN: [id number]  Referring Provider: Valerie Roys, DO  DOB: [birth year]  PCP: Valerie Roys, DO  DOS: 09/29/2018  Note by: Gaspar Cola, MD  Service setting: Ambulatory outpatient  Specialty: Interventional Pain Management  Patient type: Established  Location: [facility] (AMB) Pain Management Facility  Visit type: Interventional Procedure   Primary Reason for Visit: Interventional Pain Management Treatment. CC: Flank Pain (right)  Procedure:          Anesthesia, Analgesia, Anxiolysis:  Type: Diagnostic Posterior Intercostal  Nerve Block  #2  Region: Posterolateral Thoracic Area Level: T5, T6, T7 Ribs Laterality: Right-Sided  Type: Moderate (Conscious) Sedation combined with Local Anesthesia Indication(s): Analgesia and Anxiety Route: Intravenous (IV) IV Access: Secured Sedation: Meaningful verbal contact was maintained at all times during the procedure  Local Anesthetic: Lidocaine 1-2%  Position: Prone   Indications: 1. Chronic post-thoracotomy pain   2. Post-thoracotomy pain syndrome   3. Intercostal neuralgia   4. Intercostal pain (Right)   5. Rib pain (Primary Area of Pain) (Right)   6. Incisional pain (Secondary Area of Pain)   7. Chronic chest wall pain following surgery (Right)    Pain Score: Pre-procedure: 7 /10 Post-procedure: 0-No pain/10  Pre-op Assessment:  Susan Houston is a 57 y.o. (year old), female patient, seen today for interventional treatment. She  has a past surgical history that includes Bladder suspension (2014); Tonsillectomy; Excision neuroma (Bilateral, 09/24/2014); Esophagogastroduodenoscopy (2015); Colonoscopy; Esophagogastroduodenoscopy (egd) with propofol (N/A, 11/20/2016); Joint replacement (2009/2010); Abdominal hysterectomy (1983); Eye surgery (Left, 05/11/2018); Cholecystectomy (1998); Cataract extraction w/PHACO (Left, 05/11/2018); Thoracotomy/lobectomy (Right, 05/16/2018); Video bronchoscopy (N/A, 05/16/2018); and  Cataract extraction w/PHACO (Right, 08/11/2018). Susan Houston has a current medication list which includes the following prescription(s): albuterol, aspirin, clonazepam, gabapentin, quetiapine, tizanidine, and trazodone, and the following Facility-Administered Medications: fentanyl, ipratropium-albuterol, and midazolam. Her primarily concern today is the Flank Pain (right)  Initial Vital Signs:  Pulse/HCG Rate: (!) 102ECG Heart Rate: 100 Temp: 98 F (36.7 C) Resp: 18 BP: (!) 150/85 SpO2: 100 %  BMI: Estimated body mass index is 34.35 kg/m as calculated from the following:   Height as of this encounter: 5\' 2"  (1.575 m).   Weight as of this encounter: 187 lb 12.8 oz (85.2 kg).  Risk Assessment: Allergies: Reviewed. She is allergic to morphine and related; cymbalta [duloxetine hcl]; adhesive [tape]; and lexapro [escitalopram oxalate].  Allergy Precautions: None required Coagulopathies: Reviewed. None identified.  Blood-thinner therapy: None at this time Active Infection(s): Reviewed. None identified. Susan Houston is afebrile  Site Confirmation: Susan Houston was asked to confirm the procedure and laterality before marking the site Procedure checklist: Completed Consent: Before the procedure and under the influence of no sedative(s), amnesic(s), or anxiolytics, the patient was informed of the treatment options, risks and possible complications. To fulfill our ethical and legal obligations, as recommended by the American Medical Association's Code of Ethics, I have informed the patient of my clinical impression; the nature and purpose of the treatment or procedure; the risks, benefits, and possible complications of the intervention; the alternatives, including doing nothing; the risk(s) and benefit(s) of the alternative treatment(s) or procedure(s); and the risk(s) and benefit(s) of doing nothing. The patient was provided information about the general risks and possible complications associated with the  procedure. These may include, but are not limited to: failure to achieve desired goals, infection, bleeding, organ or nerve damage, allergic reactions, paralysis, and death. In addition, the patient was informed of those risks  and complications associated to the procedure, such as failure to decrease pain; infection; bleeding; organ or nerve damage with subsequent damage to sensory, motor, and/or autonomic systems, resulting in permanent pain, numbness, and/or weakness of one or several areas of the body; allergic reactions; (i.e.: anaphylactic reaction); and/or death. Furthermore, the patient was informed of those risks and complications associated with the medications. These include, but are not limited to: allergic reactions (i.e.: anaphylactic or anaphylactoid reaction(s)); adrenal axis suppression; blood sugar elevation that in diabetics may result in ketoacidosis or comma; water retention that in patients with history of congestive heart failure may result in shortness of breath, pulmonary edema, and decompensation with resultant heart failure; weight gain; swelling or edema; medication-induced neural toxicity; particulate matter embolism and blood vessel occlusion with resultant organ, and/or nervous system infarction; and/or aseptic necrosis of one or more joints. Finally, the patient was informed that Medicine is not an exact science; therefore, there is also the possibility of unforeseen or unpredictable risks and/or possible complications that may result in a catastrophic outcome. The patient indicated having understood very clearly. We have given the patient no guarantees and we have made no promises. Enough time was given to the patient to ask questions, all of which were answered to the patient's satisfaction. Susan Houston has indicated that she wanted to continue with the procedure. Attestation: I, the ordering provider, attest that I have discussed with the patient the benefits, risks, side-effects,  alternatives, likelihood of achieving goals, and potential problems during recovery for the procedure that I have provided informed consent. Date  Time: 09/29/2018  1:19 PM  Pre-Procedure Preparation:  Monitoring: As per clinic protocol. Respiration, ETCO2, SpO2, BP, heart rate and rhythm monitor placed and checked for adequate function Safety Precautions: Patient was assessed for positional comfort and pressure points before starting the procedure. Time-out: I initiated and conducted the "Time-out" before starting the procedure, as per protocol. The patient was asked to participate by confirming the accuracy of the "Time Out" information. Verification of the correct person, site, and procedure were performed and confirmed by me, the nursing staff, and the patient. "Time-out" conducted as per Joint Commission's Universal Protocol (UP.01.01.01). Time: 1500  Description of Procedure:          Target Area: The sub-costal neurovascular bundle area Approach: Sub-costal approach Area Prepped: Entire Postero-lateral Thoracic Region Prepping solution: ChloraPrep (2% chlorhexidine gluconate and 70% isopropyl alcohol) Safety Precautions: Aspiration looking for blood return was conducted prior to all injections. At no point did we inject any substances, as a needle was being advanced. No attempts were made at seeking any paresthesias. Safe injection practices and needle disposal techniques used. Medications properly checked for expiration dates. SDV (single dose vial) medications used. Description of the Procedure: Protocol guidelines were followed. The patient was placed in position over the procedure table. The target area was identified and the area prepped in the usual manner. Skin & deeper tissues infiltrated with local anesthetic. After cleaning the skin with an antiseptic solution, 1-2 mL of dilute local anesthetic was infiltrated subcutaneously at the planned injection site. The fingers of the palpating  hand were used to straddle the insertion site at the inferior border of the rib and fix the skin to avoid unwanted skin movement. Appropriate amount of time allowed to pass for local anesthetics to take effect. The procedure needles were then advanced to the target area at an angle of approximately 20 cephalad to the skin. Contact with the rib was made. While maintaining  the same angle of insertion, the needle was walked off the inferior border of the rib as the skin was allowed to return to its initial position. Then the needle was advanced no more than 3 mm below the inferior margin of the rib. Proper needle placement was secured. Negative aspiration confirmed. Following negative aspiration for blood or air, 3-5 mL of local anesthetic was injected. The solution injected in intermittent fashion, asking for systemic symptoms every 0.5cc of injectate. The needle was then removed and the area cleansed, making sure to leave some of the prepping solution back to take advantage of its long term bactericidal properties. Vitals:   09/29/18 1510 09/29/18 1520 09/29/18 1530 09/29/18 1541  BP: 122/72 124/83 125/85 132/70  Pulse:      Resp: (!) 9 14 13 11   Temp:  97.8 F (36.6 C)    TempSrc:      SpO2: 100% 100% 100% 100%  Weight:      Height:        Start Time: 1500 hrs. End Time: 1509 hrs.   Materials:  Needle(s) Type: Spinal Needle Gauge: 22G Length: 3.5-in Medication(s): Please see orders for medications and dosing details.  Imaging Guidance (Non-Spinal):          Type of Imaging Technique: Fluoroscopy Guidance (Non-Spinal) Indication(s): Assistance in needle guidance and placement for procedures requiring needle placement in or near specific anatomical locations not easily accessible without such assistance. Exposure Time: Please see nurses notes. Contrast: Before injecting any contrast, we confirmed that the patient did not have an allergy to iodine, shellfish, or radiological contrast. Once  satisfactory needle placement was completed at the desired level, radiological contrast was injected. Contrast injected under live fluoroscopy. No contrast complications. See chart for type and volume of contrast used. Fluoroscopic Guidance: I was personally present during the use of fluoroscopy. "Tunnel Vision Technique" used to obtain the best possible view of the target area. Parallax error corrected before commencing the procedure. "Direction-depth-direction" technique used to introduce the needle under continuous pulsed fluoroscopy. Once target was reached, antero-posterior, oblique, and lateral fluoroscopic projection used confirm needle placement in all planes. Images permanently stored in EMR. Interpretation: I personally interpreted the imaging intraoperatively. Adequate needle placement confirmed in multiple planes. Appropriate spread of contrast into desired area was observed. No evidence of afferent or efferent intravascular uptake. Permanent images saved into the patient's record.  Antibiotic Prophylaxis:   Anti-infectives (From admission, onward)   None     Indication(s): None identified  Post-operative Assessment:  Post-procedure Vital Signs:  Pulse/HCG Rate: (!) 10282 Temp: 97.8 F (36.6 C) Resp: 11 BP: 132/70 SpO2: 100 %  EBL: None  Complications: No immediate post-treatment complications observed by team, or reported by patient.  Note: The patient tolerated the entire procedure well. A repeat set of vitals were taken after the procedure and the patient was kept under observation following institutional policy, for this type of procedure. Post-procedural neurological assessment was performed, showing return to baseline, prior to discharge. The patient was provided with post-procedure discharge instructions, including a section on how to identify potential problems. Should any problems arise concerning this procedure, the patient was given instructions to immediately contact  us, at any time, without hesitation. In any case, we plan to contact the patient by telephone for a follow-up status report regarding this interventional procedure.  Comments:  No additional relevant information.  Plan of Care    Imaging Orders     DG C-Arm 1-60 Min-No Report  Procedure  Orders     INTERCOSTAL NERVE BLOCK  Medications ordered for procedure: Meds ordered this encounter  Medications  . lidocaine (XYLOCAINE) 2 % (with pres) injection 400 mg  . midazolam (VERSED) 5 MG/5ML injection 1-2 mg    Make sure Flumazenil is available in the pyxis when using this medication. If oversedation occurs, administer 0.2 mg IV over 15 sec. If after 45 sec no response, administer 0.2 mg again over 1 min; may repeat at 1 min intervals; not to exceed 4 doses (1 mg)  . fentaNYL (SUBLIMAZE) injection 25-50 mcg    Make sure Narcan is available in the pyxis when using this medication. In the event of respiratory depression (RR< 8/min): Titrate NARCAN (naloxone) in increments of 0.1 to 0.2 mg IV at 2-3 minute intervals, until desired degree of reversal.  . lactated ringers infusion 1,000 mL  . ropivacaine (PF) 2 mg/mL (0.2%) (NAROPIN) injection 9 mL  . dexamethasone (DECADRON) injection 10 mg   Medications administered: We administered lidocaine, midazolam, fentaNYL, lactated ringers, ropivacaine (PF) 2 mg/mL (0.2%), and dexamethasone.  See the medical record for exact dosing, route, and time of administration.  Disposition: Discharge home  Discharge Date & Time: 09/29/2018;  3:26 PM.   Physician-requested Follow-up: Return for post-procedure eval (2 wks), w/ Dr. Dossie Arbour.  Future Appointments  Date Time Provider [place]  10/04/2018  3:45 PM Valerie Roys, DO [facility]  10/18/2018  3:30 PM Ursula Alert, MD [facility] None  10/26/2018  1:30 PM Milinda Pointer, MD [facility] None  11/04/2018 10:00 AM [facility]  11/07/2018  1:30 PM [facility] None   11/07/2018  2:00 PM Cammie Sickle, MD [facility] None   Primary Care Physician: Valerie Roys, DO Location: [facility] Note by: Gaspar Cola, MD Date: 09/29/2018; Time: 5:02 PM  Disclaimer:  Medicine is not an Chief Strategy Officer. The only guarantee in medicine is that nothing is guaranteed. It is important to note that the decision to proceed with this intervention was based on the information collected from the patient. The Data and conclusions were drawn from the patient's questionnaire, the interview, and the physical examination. Because the information was provided in large part by the patient, it cannot be guaranteed that it has not been purposely or unconsciously manipulated. Every effort has been made to obtain as much relevant data as possible for this evaluation. It is important to note that the conclusions that lead to this procedure are derived in large part from the available data. Always take into account that the treatment will also be dependent on availability of resources and existing treatment guidelines, considered by other Pain Management Practitioners as being common knowledge and practice, at the time of the intervention. For Medico-Legal purposes, it is also important to point out that variation in procedural techniques and pharmacological choices are the acceptable norm. The indications, contraindications, technique, and results of the above procedure should only be interpreted and judged by a Board-Certified Interventional Pain Specialist with extensive familiarity and expertise in the same exact procedure and technique.

## 2018-09-29 NOTE — Patient Instructions (Signed)

## 2018-09-29 NOTE — Progress Notes (Signed)
Safety precautions to be maintained throughout the outpatient stay will include: orient to surroundings, keep bed in low position, maintain call bell within reach at all times, provide assistance with transfer out of bed and ambulation.  

## 2018-09-30 ENCOUNTER — Telehealth: Payer: Self-pay | Admitting: *Deleted

## 2018-09-30 ENCOUNTER — Telehealth: Payer: Self-pay | Admitting: Family Medicine

## 2018-09-30 NOTE — Telephone Encounter (Signed)
She is asking too soon. Was given that Rx 2 weeks ago- with 20 pills. Not due until next week.

## 2018-09-30 NOTE — Telephone Encounter (Signed)
We will discuss it at her appointment. Will not get a Rx now.

## 2018-09-30 NOTE — Telephone Encounter (Signed)
Copied from [place] [id number]. Topic: Quick Communication - Rx Refill/Question >> Sep 30, 2018 10:00 AM Margot Ables wrote: Medication: clonazePAM (KLONOPIN) 1 MG tablet - pt has 1 pill left - pt notes she took 2 a couple of nights because she was still awake at 4:00am - pt has appt with therapist on 10/18/18 and asking for refill to get to her appt date  Has the patient contacted their pharmacy? no Preferred Pharmacy (with phone number or street name): [id number] [address], [place] [phone] (Phone) [phone] (Fax)

## 2018-09-30 NOTE — Telephone Encounter (Signed)
Patient wants to know if RX will be refilled at upcoming appointment with provider on Tuesday. Patient stated she's had to take more than 1 tablet on occasion. Encouraged her to take medication as prescribed and if she was still having issues to contact provider instead. Patient states she will need to have dosage or something increased because this is not working for her. I advised that would be at the providers discretion. Please advise.

## 2018-09-30 NOTE — Telephone Encounter (Signed)
Left voicemail with patient to call our office if there are questions or concerns re; procedure on yesterday.  Also, when in the procedure room on yesterday patient had a question about taking valium or Klonopin prior to procedure because she was feeling as if she was going to have a panic attack. Patient reports that she had to wait 1.5 hours on yesterday and had lots of anxiety.  I told her that Dr Dossie Arbour would prefer she not take anything prior to procedure like that.  Patient asks what is she supposed to do about her anxiety?  I told her that to remind [place] of this event when she comes next time and we will talk to Dr Dossie Arbour about it and see how he would like to treat her.  Patient verbalizes u/o information. (Patient called me while in the middle of typing this note)

## 2018-10-04 ENCOUNTER — Ambulatory Visit (INDEPENDENT_AMBULATORY_CARE_PROVIDER_SITE_OTHER): Payer: Medicare HMO | Admitting: Family Medicine

## 2018-10-04 ENCOUNTER — Encounter: Payer: Self-pay | Admitting: Family Medicine

## 2018-10-04 VITALS — BP 130/85 | HR 100 | Temp 98.0°F | Wt 190.0 lb

## 2018-10-04 DIAGNOSIS — F5101 Primary insomnia: Secondary | ICD-10-CM | POA: Diagnosis not present

## 2018-10-04 MED ORDER — CLONAZEPAM 1 MG PO TABS: 1.0000 mg | ORAL_TABLET | Freq: Every day | ORAL | 0 refills | Status: DC

## 2018-10-04 MED ORDER — QUETIAPINE FUMARATE 100 MG PO TABS: 200.0000 mg | ORAL_TABLET | Freq: Every day | ORAL | 1 refills | Status: DC

## 2018-10-04 NOTE — Progress Notes (Signed)
BP 130/85   Pulse 100   Temp 98 F (36.7 C) (Oral)   Wt 190 lb (86.2 kg)   SpO2 96%   BMI 34.75 kg/m    Subjective:    Patient ID: Susan Houston, female    DOB: Dec 01, 1960, 57 y.o.   MRN: [id number]  HPI: Susan Houston is a 57 y.o. female  Chief Complaint  Patient presents with  . Insomnia    Same. Gets about  2 hours of sleep w/ klonopin   INSOMNIA- Susan Houston is now sleeping about 2 hours on 1mg  of klonopin. She has not stopped her seroquel, still taking her gabapentin. Has an appointment to see psychiatry on 10/18/18. She takes that klonopin by 9PM, falls asleep about 10, down until about 12 and then up until 10PM the next day Duration: 3 weeks Satisfied with sleep quality: no Difficulty falling asleep: yes Difficulty staying asleep: yes Waking a few hours after sleep onset: yes Early morning awakenings: yes Daytime hypersomnolence: no Wakes feeling refreshed: no Good sleep hygiene: no Apnea: no Snoring: no Depressed/anxious mood: no Recent stress: no Restless legs/nocturnal leg cramps: no Chronic pain/arthritis: no History of sleep study: no Treatments attempted: seroquel, trazodone, gabapentin, 1mg  klonopin   Relevant past medical, surgical, family and social history reviewed and updated as indicated. Interim medical history since our last visit reviewed. Allergies and medications reviewed and updated.  Review of Systems  Constitutional: Positive for fatigue. Negative for activity change, appetite change, chills, diaphoresis, fever and unexpected weight change.  Respiratory: Negative.   Cardiovascular: Negative.   Musculoskeletal: Negative.   Skin: Negative.   Psychiatric/Behavioral: Positive for sleep disturbance. Negative for agitation, behavioral problems, confusion, decreased concentration, dysphoric mood, hallucinations, self-injury and suicidal ideas. The patient is not nervous/anxious and is not hyperactive.     Per HPI unless specifically indicated above    Objective:    BP 130/85   Pulse 100   Temp 98 F (36.7 C) (Oral)   Wt 190 lb (86.2 kg)   SpO2 96%   BMI 34.75 kg/m   Wt Readings from Last 3 Encounters:  10/04/18 190 lb (86.2 kg)  09/29/18 187 lb 12.8 oz (85.2 kg)  09/28/18 170 lb (77.1 kg)    Physical Exam Vitals signs and nursing note reviewed.  Constitutional:      General: She is not in acute distress.    Appearance: Normal appearance. She is not ill-appearing, toxic-appearing or diaphoretic.  HENT:     Head: Normocephalic and atraumatic.     Right Ear: External ear normal.     Left Ear: External ear normal.     Nose: Nose normal.     Mouth/Throat:     Mouth: Mucous membranes are moist.     Pharynx: Oropharynx is clear.  Eyes:     General: No scleral icterus.       Right eye: No discharge.        Left eye: No discharge.     Extraocular Movements: Extraocular movements intact.     Conjunctiva/sclera: Conjunctivae normal.     Pupils: Pupils are equal, round, and reactive to light.  Neck:     Musculoskeletal: Normal range of motion and neck supple.  Cardiovascular:     Rate and Rhythm: Normal rate and regular rhythm.     Pulses: Normal pulses.     Heart sounds: Normal heart sounds. No murmur. No friction rub. No gallop.   Pulmonary:     Effort: Pulmonary effort is  normal. No respiratory distress.     Breath sounds: No stridor. Wheezing present. No rhonchi or rales.  Chest:     Chest wall: No tenderness.  Musculoskeletal: Normal range of motion.  Skin:    General: Skin is warm and dry.     Capillary Refill: Capillary refill takes less than 2 seconds.     Coloration: Skin is not jaundiced or pale.     Findings: No bruising, erythema, lesion or rash.  Neurological:     General: No focal deficit present.     Mental Status: She is alert and oriented to person, place, and time. Mental status is at baseline.  Psychiatric:        Mood and Affect: Mood normal.        Behavior: Behavior normal.        Thought  Content: Thought content normal.        Judgment: Judgment normal.     Results for orders placed or performed during the hospital encounter of 09/11/18  Urinalysis, Complete w Microscopic  Result Value Ref Range   Color, Urine STRAW (A) YELLOW   APPearance CLEAR (A) CLEAR   Specific Gravity, Urine 1.003 (L) 1.005 - 1.030   pH 6.0 5.0 - 8.0   Glucose, UA NEGATIVE NEGATIVE mg/dL   Hgb urine dipstick MODERATE (A) NEGATIVE   Bilirubin Urine NEGATIVE NEGATIVE   Ketones, ur NEGATIVE NEGATIVE mg/dL   Protein, ur NEGATIVE NEGATIVE mg/dL   Nitrite NEGATIVE NEGATIVE   Leukocytes, UA NEGATIVE NEGATIVE   RBC / HPF 0-5 0 - 5 RBC/hpf   WBC, UA 0-5 0 - 5 WBC/hpf   Bacteria, UA RARE (A) NONE SEEN   Squamous Epithelial / LPF 0-5 0 - 5   Mucus PRESENT   Urine Drug Screen, Qualitative  Result Value Ref Range   Tricyclic, Ur Screen NONE DETECTED NONE DETECTED   Amphetamines, Ur Screen NONE DETECTED NONE DETECTED   MDMA (Ecstasy)Ur Screen NONE DETECTED NONE DETECTED   Cocaine Metabolite,Ur [place] NONE DETECTED NONE DETECTED   Opiate, Ur Screen NONE DETECTED NONE DETECTED   Phencyclidine (PCP) Ur S NONE DETECTED NONE DETECTED   Cannabinoid 50 Ng, Ur  NONE DETECTED NONE DETECTED   Barbiturates, Ur Screen NONE DETECTED NONE DETECTED   Benzodiazepine, Ur Scrn NONE DETECTED NONE DETECTED   Methadone Scn, Ur NONE DETECTED NONE DETECTED  Comprehensive metabolic panel  Result Value Ref Range   Sodium 141 135 - 145 mmol/L   Potassium 3.5 3.5 - 5.1 mmol/L   Chloride 102 98 - 111 mmol/L   CO2 25 22 - 32 mmol/L   Glucose, Bld 129 (H) 70 - 99 mg/dL   BUN 11 6 - 20 mg/dL   Creatinine, Ser 0.71 0.44 - 1.00 mg/dL   Calcium 9.5 8.9 - 10.3 mg/dL   Total Protein 7.3 6.5 - 8.1 g/dL   Albumin 4.0 3.5 - 5.0 g/dL   AST 23 15 - 41 U/L   ALT 14 0 - 44 U/L   Alkaline Phosphatase 83 38 - 126 U/L   Total Bilirubin 0.6 0.3 - 1.2 mg/dL   GFR calc non Af Amer >60 >60 mL/min   GFR calc Af Amer >60 >60 mL/min   Anion  gap 14 5 - 15  CBC with Differential  Result Value Ref Range   WBC 8.4 4.0 - 10.5 K/uL   RBC 4.62 3.87 - 5.11 MIL/uL   Hemoglobin 12.9 12.0 - 15.0 g/dL   HCT 39.3 36.0 -  46.0 %   MCV 85.1 80.0 - 100.0 fL   MCH 27.9 26.0 - 34.0 pg   MCHC 32.8 30.0 - 36.0 g/dL   RDW 13.6 11.5 - 15.5 %   Platelets 303 150 - 400 K/uL   nRBC 0.0 0.0 - 0.2 %   Neutrophils Relative % 58 %   Neutro Abs 4.9 1.7 - 7.7 K/uL   Lymphocytes Relative 32 %   Lymphs Abs 2.7 0.7 - 4.0 K/uL   Monocytes Relative 8 %   Monocytes Absolute 0.7 0.1 - 1.0 K/uL   Eosinophils Relative 1 %   Eosinophils Absolute 0.1 0.0 - 0.5 K/uL   Basophils Relative 1 %   Basophils Absolute 0.1 0.0 - 0.1 K/uL   Immature Granulocytes 0 %   Abs Immature Granulocytes 0.03 0.00 - 0.07 K/uL  Lactic acid, plasma  Result Value Ref Range   Lactic Acid, Venous 1.8 0.5 - 1.9 mmol/L  CK  Result Value Ref Range   Total CK 48 38 - 234 U/L  TSH  Result Value Ref Range   TSH 3.676 0.350 - 4.500 uIU/mL  T4, free  Result Value Ref Range   Free T4 1.12 0.82 - 1.77 ng/dL  Acetaminophen level  Result Value Ref Range   Acetaminophen (Tylenol), Serum <10 (L) 10 - 30 ug/mL  Salicylate level  Result Value Ref Range   Salicylate Lvl <0.7 2.8 - 30.0 mg/dL  Ethanol  Result Value Ref Range   Alcohol, Ethyl (B) <10 <10 mg/dL      Assessment & Plan:   Problem List Items Addressed This Visit      Other   Primary insomnia - Primary    Still only sleeping 2 hours on her klonopin, trazodone, gabapentin and 1mg  of klonopin. I'm not comfortable increasing her klonopin dose. Will increase her seroquel to 200mg  qHS- seeing psychiatry on 10/18/18. 20 days of klonopin given today. Recheck following psychiatry appointment. Call with any concerns.           Follow up plan: Return in about 2 weeks (around 10/18/2018) for follow up sleep.

## 2018-10-04 NOTE — Assessment & Plan Note (Signed)
Still only sleeping 2 hours on her klonopin, trazodone, gabapentin and 1mg  of klonopin. I'm not comfortable increasing her klonopin dose. Will increase her seroquel to 200mg  qHS- seeing psychiatry on 10/18/18. 20 days of klonopin given today. Recheck following psychiatry appointment. Call with any concerns.

## 2018-10-07 ENCOUNTER — Ambulatory Visit (INDEPENDENT_AMBULATORY_CARE_PROVIDER_SITE_OTHER): Payer: Medicare HMO | Admitting: Family Medicine

## 2018-10-07 ENCOUNTER — Encounter: Payer: Self-pay | Admitting: Family Medicine

## 2018-10-07 ENCOUNTER — Ambulatory Visit: Payer: Self-pay

## 2018-10-07 VITALS — BP 130/81 | HR 104 | Temp 98.4°F | Ht 62.0 in | Wt 190.3 lb

## 2018-10-07 DIAGNOSIS — L0292 Furuncle, unspecified: Secondary | ICD-10-CM

## 2018-10-07 MED ORDER — KETOROLAC TROMETHAMINE 60 MG/2ML IM SOLN
60.0000 mg | Freq: Once | INTRAMUSCULAR | Status: AC
  Administered 2018-10-07: 60 mg via INTRAMUSCULAR

## 2018-10-07 MED ORDER — SULFAMETHOXAZOLE-TRIMETHOPRIM 800-160 MG PO TABS: 1.0000 | ORAL_TABLET | Freq: Two times a day (BID) | ORAL | 0 refills | Status: DC

## 2018-10-07 NOTE — Progress Notes (Signed)
BP 130/81   Pulse (!) 104   Temp 98.4 F (36.9 C) (Oral)   Ht 5\' 2"  (1.575 m)   Wt 190 lb 4.8 oz (86.3 kg)   SpO2 96%   BMI 34.81 kg/m    Subjective:    Patient ID: Susan Houston, female    DOB: 11/14/1960, 57 y.o.   MRN: [id number]  HPI: Susan Houston is a 57 y.o. female  Chief Complaint  Patient presents with  . Cyst    under left breast, states it is very painful. Noticed 2 days ago.    Here today with a painful cyst under left breast that she first noticed 2 days ago. Red, warm, swollen. No active drainage, no fevers, chills, body aches. Not using anything OTC for sxs. No known cuts/scrapes prior to onset.   Relevant past medical, surgical, family and social history reviewed and updated as indicated. Interim medical history since our last visit reviewed. Allergies and medications reviewed and updated.  Review of Systems  Per HPI unless specifically indicated above     Objective:    BP 130/81   Pulse (!) 104   Temp 98.4 F (36.9 C) (Oral)   Ht 5\' 2"  (1.575 m)   Wt 190 lb 4.8 oz (86.3 kg)   SpO2 96%   BMI 34.81 kg/m   Wt Readings from Last 3 Encounters:  10/07/18 190 lb 4.8 oz (86.3 kg)  10/04/18 190 lb (86.2 kg)  09/29/18 187 lb 12.8 oz (85.2 kg)    Physical Exam Vitals signs and nursing note reviewed.  Constitutional:      Appearance: Normal appearance.  HENT:     Head: Atraumatic.  Eyes:     Extraocular Movements: Extraocular movements intact.     Conjunctiva/sclera: Conjunctivae normal.  Neck:     Musculoskeletal: Normal range of motion and neck supple.  Cardiovascular:     Rate and Rhythm: Normal rate and regular rhythm.  Pulmonary:     Effort: Pulmonary effort is normal.     Breath sounds: Normal breath sounds.  Musculoskeletal: Normal range of motion.  Skin:    General: Skin is warm and dry.     Findings: Erythema (erythematous and edematous boil under left breast, fluctuant and significantly ttp) present.  Neurological:     Mental Status:  She is alert and oriented to person, place, and time.  Psychiatric:        Mood and Affect: Mood normal.        Thought Content: Thought content normal.        Judgment: Judgment normal.    Procedure: I and D of abscess under left breast Procedure explained in detail and questions answered adequately.  Area was cleaned with alcohol pad and infiltrated with 2 cc 1% lidocaine with epi. Sterile area made with betadine. Scalpel used to make small incision at center of abscess. Gauze pad and pressure used to express purulent drainage. Area was then dressed with neosporin ointment and gauze pads. Wound care reviewed. Procedure tolerated well with no immediate complications.    Results for orders placed or performed during the hospital encounter of 09/11/18  Urinalysis, Complete w Microscopic  Result Value Ref Range   Color, Urine STRAW (A) YELLOW   APPearance CLEAR (A) CLEAR   Specific Gravity, Urine 1.003 (L) 1.005 - 1.030   pH 6.0 5.0 - 8.0   Glucose, UA NEGATIVE NEGATIVE mg/dL   Hgb urine dipstick MODERATE (A) NEGATIVE   Bilirubin Urine NEGATIVE  NEGATIVE   Ketones, ur NEGATIVE NEGATIVE mg/dL   Protein, ur NEGATIVE NEGATIVE mg/dL   Nitrite NEGATIVE NEGATIVE   Leukocytes, UA NEGATIVE NEGATIVE   RBC / HPF 0-5 0 - 5 RBC/hpf   WBC, UA 0-5 0 - 5 WBC/hpf   Bacteria, UA RARE (A) NONE SEEN   Squamous Epithelial / LPF 0-5 0 - 5   Mucus PRESENT   Urine Drug Screen, Qualitative  Result Value Ref Range   Tricyclic, Ur Screen NONE DETECTED NONE DETECTED   Amphetamines, Ur Screen NONE DETECTED NONE DETECTED   MDMA (Ecstasy)Ur Screen NONE DETECTED NONE DETECTED   Cocaine Metabolite,Ur [place] NONE DETECTED NONE DETECTED   Opiate, Ur Screen NONE DETECTED NONE DETECTED   Phencyclidine (PCP) Ur S NONE DETECTED NONE DETECTED   Cannabinoid 50 Ng, Ur [place] NONE DETECTED NONE DETECTED   Barbiturates, Ur Screen NONE DETECTED NONE DETECTED   Benzodiazepine, Ur Scrn NONE DETECTED NONE DETECTED   Methadone Scn,  Ur NONE DETECTED NONE DETECTED  Comprehensive metabolic panel  Result Value Ref Range   Sodium 141 135 - 145 mmol/L   Potassium 3.5 3.5 - 5.1 mmol/L   Chloride 102 98 - 111 mmol/L   CO2 25 22 - 32 mmol/L   Glucose, Bld 129 (H) 70 - 99 mg/dL   BUN 11 6 - 20 mg/dL   Creatinine, Ser 0.71 0.44 - 1.00 mg/dL   Calcium 9.5 8.9 - 10.3 mg/dL   Total Protein 7.3 6.5 - 8.1 g/dL   Albumin 4.0 3.5 - 5.0 g/dL   AST 23 15 - 41 U/L   ALT 14 0 - 44 U/L   Alkaline Phosphatase 83 38 - 126 U/L   Total Bilirubin 0.6 0.3 - 1.2 mg/dL   GFR calc non Af Amer >60 >60 mL/min   GFR calc Af Amer >60 >60 mL/min   Anion gap 14 5 - 15  CBC with Differential  Result Value Ref Range   WBC 8.4 4.0 - 10.5 K/uL   RBC 4.62 3.87 - 5.11 MIL/uL   Hemoglobin 12.9 12.0 - 15.0 g/dL   HCT 39.3 36.0 - 46.0 %   MCV 85.1 80.0 - 100.0 fL   MCH 27.9 26.0 - 34.0 pg   MCHC 32.8 30.0 - 36.0 g/dL   RDW 13.6 11.5 - 15.5 %   Platelets 303 150 - 400 K/uL   nRBC 0.0 0.0 - 0.2 %   Neutrophils Relative % 58 %   Neutro Abs 4.9 1.7 - 7.7 K/uL   Lymphocytes Relative 32 %   Lymphs Abs 2.7 0.7 - 4.0 K/uL   Monocytes Relative 8 %   Monocytes Absolute 0.7 0.1 - 1.0 K/uL   Eosinophils Relative 1 %   Eosinophils Absolute 0.1 0.0 - 0.5 K/uL   Basophils Relative 1 %   Basophils Absolute 0.1 0.0 - 0.1 K/uL   Immature Granulocytes 0 %   Abs Immature Granulocytes 0.03 0.00 - 0.07 K/uL  Lactic acid, plasma  Result Value Ref Range   Lactic Acid, Venous 1.8 0.5 - 1.9 mmol/L  CK  Result Value Ref Range   Total CK 48 38 - 234 U/L  TSH  Result Value Ref Range   TSH 3.676 0.350 - 4.500 uIU/mL  T4, free  Result Value Ref Range   Free T4 1.12 0.82 - 1.77 ng/dL  Acetaminophen level  Result Value Ref Range   Acetaminophen (Tylenol), Serum <10 (L) 10 - 30 ug/mL  Salicylate level  Result Value Ref  Range   Salicylate Lvl <1.9 2.8 - 30.0 mg/dL  Ethanol  Result Value Ref Range   Alcohol, Ethyl (B) <10 <10 mg/dL      Assessment & Plan:    Problem List Items Addressed This Visit    None    Visit Diagnoses    Boil    -  Primary   I and D performed without complication and bactrim sent. Discussed wound care with BID washes and neosporin, warm compresses   Relevant Medications   ketorolac (TORADOL) injection 60 mg (Completed)   sulfamethoxazole-trimethoprim (BACTRIM DS,SEPTRA DS) 800-160 MG tablet       Follow up plan: Return in about 1 week (around 10/14/2018) for wound check.

## 2018-10-07 NOTE — Telephone Encounter (Signed)
Pt. Reports 2 days ago she noticed a hard lump under her left breast - dime-sized. This morning it is the size "of a fifty cent piece." It is hard and "very red and very painful." Appointment made for today. No availability with Dr. Wynetta Emery.  Reason for Disposition . [1Breast looks infected (spreading redness, feels hot or painful to touch) AND [2] no fever  Answer Assessment - Initial Assessment Questions 1. SYMPTOM: "What's the main symptom you're concerned about?"  (e.g., lump, pain, rash, nipple discharge)     Hard lump 2. LOCATION: "Where is the Lump located?"     Under left breast 3. ONSET: "When did Lump  start?"     2 days ago 4. PRIOR HISTORY: "Do you have any history of prior problems with your breasts?" (e.g., lumps, cancer, fibrocystic breast disease)     No 5. CAUSE: "What do you think is causing this symptom?"     Unsure 6. OTHER SYMPTOMS: "Do you have any other symptoms?" (e.g., fever, breast pain, redness or rash, nipple discharge)     Painful and very red 7. PREGNANCY-BREASTFEEDING: "Is there any chance you are pregnant?" "When was your last menstrual period?" "Are you breastfeeding?"     No  Protocols used: BREAST [facility]

## 2018-10-10 ENCOUNTER — Encounter: Payer: Self-pay | Admitting: Family Medicine

## 2018-10-10 ENCOUNTER — Ambulatory Visit: Payer: Self-pay | Admitting: *Deleted

## 2018-10-10 ENCOUNTER — Ambulatory Visit (INDEPENDENT_AMBULATORY_CARE_PROVIDER_SITE_OTHER): Payer: Medicare HMO | Admitting: Family Medicine

## 2018-10-10 VITALS — BP 127/81 | HR 107 | Temp 98.3°F | Ht 62.0 in | Wt 199.2 lb

## 2018-10-10 DIAGNOSIS — N611 Abscess of the breast and nipple: Secondary | ICD-10-CM | POA: Diagnosis not present

## 2018-10-10 MED ORDER — DOXYCYCLINE HYCLATE 100 MG PO TABS: 100.0000 mg | ORAL_TABLET | Freq: Two times a day (BID) | ORAL | 0 refills | Status: DC

## 2018-10-10 MED ORDER — KETOROLAC TROMETHAMINE 60 MG/2ML IM SOLN
60.0000 mg | Freq: Once | INTRAMUSCULAR | Status: AC
  Administered 2018-10-10: 60 mg via INTRAMUSCULAR

## 2018-10-10 NOTE — Telephone Encounter (Signed)
Pt states she had 'boil' lanced 10/07/18 by [place]. States increased "Mild" swelling, redness and warmth to area, onset yesterday. States redness and warmth is "Size of .50 piece." 8/10 tenderness, constant. States "It's clogged." States "I was told it would drain but has not drained at all." Unsure if febrile but "I feel hot." HAs been taking RXed antibiotic and doing warm compresses. Appt made for tomorrow AM as earliest available.  Notified Christan who will alert R. Orene Desanctis, possibility of appt today or UC.  Per Christan, appt secured for today 1615 with R[address]. Pt made aware.    Reason for Disposition . Nursing judgment or information in reference  Answer Assessment - Initial Assessment Questions 1. REASON FOR CALL: "What is your main concern right now?"     HAd 'boil' lanced 10/07/18, Increased swelling, red, warm to touch 2. ONSET: "When did the *No Answer* start?"     Yesterday 3. SEVERITY: "How bad is the *No Answer*?"     Tenderness 8/10 4. FEVER: "Do you have a fever?"     "Feel hot, no thermometer."  5. OTHER SYMPTOMS: "Do you have any other new symptoms?"     no 6. INTERVENTIONS AND RESPONSE: "What have you done so far to try to make this better? What medications have you used?"  Protocols used: NO GUIDELINE AVAILABLE-A-AH

## 2018-10-10 NOTE — Progress Notes (Signed)
BP 127/81 (BP Location: Left Arm, Patient Position: Sitting, Cuff Size: Large)   Pulse (!) 107   Temp 98.3 F (36.8 C)   Ht 5\' 2"  (1.575 m)   Wt 199 lb 3 oz (90.4 kg)   SpO2 96%   BMI 36.43 kg/m    Subjective:    Patient ID: Susan Houston, female    DOB: [birth year], 56 y.o.   MRN: [id number]  HPI: Susan Houston is a 57 y.o. female  Chief Complaint  Patient presents with  . Abscess    Patient states that it is not draining anymore and that she thinks the incision is closed   Here today following up on left breast boil. Has been on bactrim for 2-3 days now with minimal relief. Had some purulent drainage a day or so ago but that's stopped and she feels like it's filling back up. No fevers, chills, sweats but does feel run down. Has been keeping covered and using neosporin.   Relevant past medical, surgical, family and social history reviewed and updated as indicated. Interim medical history since our last visit reviewed. Allergies and medications reviewed and updated.  Review of Systems  Per HPI unless specifically indicated above     Objective:    BP 127/81 (BP Location: Left Arm, Patient Position: Sitting, Cuff Size: Large)   Pulse (!) 107   Temp 98.3 F (36.8 C)   Ht 5\' 2"  (1.575 m)   Wt 199 lb 3 oz (90.4 kg)   SpO2 96%   BMI 36.43 kg/m   Wt Readings from Last 3 Encounters:  10/10/18 199 lb 3 oz (90.4 kg)  10/07/18 190 lb 4.8 oz (86.3 kg)  10/04/18 190 lb (86.2 kg)    Physical Exam Vitals signs and nursing note reviewed.  Constitutional:      Appearance: Normal appearance. She is not ill-appearing.  HENT:     Head: Atraumatic.  Eyes:     Extraocular Movements: Extraocular movements intact.     Conjunctiva/sclera: Conjunctivae normal.  Neck:     Musculoskeletal: Normal range of motion and neck supple.  Cardiovascular:     Rate and Rhythm: Normal rate and regular rhythm.     Heart sounds: Normal heart sounds.  Pulmonary:     Effort: Pulmonary effort is  normal.     Breath sounds: Normal breath sounds.  Musculoskeletal: Normal range of motion.  Skin:    General: Skin is warm and dry.     Findings: Erythema present.     Comments: Erythematous and edematous boil inferior to left breast with multiple small pustules. Significantly ttp and warm to the touch  Neurological:     Mental Status: She is alert and oriented to person, place, and time.  Psychiatric:        Mood and Affect: Mood normal.        Thought Content: Thought content normal.        Judgment: Judgment normal.     Results for orders placed or performed during the hospital encounter of 09/11/18  Urinalysis, Complete w Microscopic  Result Value Ref Range   Color, Urine STRAW (A) YELLOW   APPearance CLEAR (A) CLEAR   Specific Gravity, Urine 1.003 (L) 1.005 - 1.030   pH 6.0 5.0 - 8.0   Glucose, UA NEGATIVE NEGATIVE mg/dL   Hgb urine dipstick MODERATE (A) NEGATIVE   Bilirubin Urine NEGATIVE NEGATIVE   Ketones, ur NEGATIVE NEGATIVE mg/dL   Protein, ur NEGATIVE NEGATIVE mg/dL  Nitrite NEGATIVE NEGATIVE   Leukocytes, UA NEGATIVE NEGATIVE   RBC / HPF 0-5 0 - 5 RBC/hpf   WBC, UA 0-5 0 - 5 WBC/hpf   Bacteria, UA RARE (A) NONE SEEN   Squamous Epithelial / LPF 0-5 0 - 5   Mucus PRESENT   Urine Drug Screen, Qualitative  Result Value Ref Range   Tricyclic, Ur Screen NONE DETECTED NONE DETECTED   Amphetamines, Ur Screen NONE DETECTED NONE DETECTED   MDMA (Ecstasy)Ur Screen NONE DETECTED NONE DETECTED   Cocaine Metabolite,Ur [place] NONE DETECTED NONE DETECTED   Opiate, Ur Screen NONE DETECTED NONE DETECTED   Phencyclidine (PCP) Ur S NONE DETECTED NONE DETECTED   Cannabinoid 50 Ng, Ur [place] NONE DETECTED NONE DETECTED   Barbiturates, Ur Screen NONE DETECTED NONE DETECTED   Benzodiazepine, Ur Scrn NONE DETECTED NONE DETECTED   Methadone Scn, Ur NONE DETECTED NONE DETECTED  Comprehensive metabolic panel  Result Value Ref Range   Sodium 141 135 - 145 mmol/L   Potassium 3.5 3.5 - 5.1  mmol/L   Chloride 102 98 - 111 mmol/L   CO2 25 22 - 32 mmol/L   Glucose, Bld 129 (H) 70 - 99 mg/dL   BUN 11 6 - 20 mg/dL   Creatinine, Ser 0.71 0.44 - 1.00 mg/dL   Calcium 9.5 8.9 - 10.3 mg/dL   Total Protein 7.3 6.5 - 8.1 g/dL   Albumin 4.0 3.5 - 5.0 g/dL   AST 23 15 - 41 U/L   ALT 14 0 - 44 U/L   Alkaline Phosphatase 83 38 - 126 U/L   Total Bilirubin 0.6 0.3 - 1.2 mg/dL   GFR calc non Af Amer >60 >60 mL/min   GFR calc Af Amer >60 >60 mL/min   Anion gap 14 5 - 15  CBC with Differential  Result Value Ref Range   WBC 8.4 4.0 - 10.5 K/uL   RBC 4.62 3.87 - 5.11 MIL/uL   Hemoglobin 12.9 12.0 - 15.0 g/dL   HCT 39.3 36.0 - 46.0 %   MCV 85.1 80.0 - 100.0 fL   MCH 27.9 26.0 - 34.0 pg   MCHC 32.8 30.0 - 36.0 g/dL   RDW 13.6 11.5 - 15.5 %   Platelets 303 150 - 400 K/uL   nRBC 0.0 0.0 - 0.2 %   Neutrophils Relative % 58 %   Neutro Abs 4.9 1.7 - 7.7 K/uL   Lymphocytes Relative 32 %   Lymphs Abs 2.7 0.7 - 4.0 K/uL   Monocytes Relative 8 %   Monocytes Absolute 0.7 0.1 - 1.0 K/uL   Eosinophils Relative 1 %   Eosinophils Absolute 0.1 0.0 - 0.5 K/uL   Basophils Relative 1 %   Basophils Absolute 0.1 0.0 - 0.1 K/uL   Immature Granulocytes 0 %   Abs Immature Granulocytes 0.03 0.00 - 0.07 K/uL  Lactic acid, plasma  Result Value Ref Range   Lactic Acid, Venous 1.8 0.5 - 1.9 mmol/L  CK  Result Value Ref Range   Total CK 48 38 - 234 U/L  TSH  Result Value Ref Range   TSH 3.676 0.350 - 4.500 uIU/mL  T4, free  Result Value Ref Range   Free T4 1.12 0.82 - 1.77 ng/dL  Acetaminophen level  Result Value Ref Range   Acetaminophen (Tylenol), Serum <10 (L) 10 - 30 ug/mL  Salicylate level  Result Value Ref Range   Salicylate Lvl <2.8 2.8 - 30.0 mg/dL  Ethanol  Result Value Ref Range  Alcohol, Ethyl (B) <10 <10 mg/dL      Assessment & Plan:   Problem List Items Addressed This Visit    None    Visit Diagnoses    Left breast abscess    -  Primary   Wound culture performed, switch  from bactrim to doxy. Continue good wound care. Strict return precautions reviewed. Recheck in 1 wk   Relevant Medications   ketorolac (TORADOL) injection 60 mg (Completed)   Other Relevant Orders   Wound culture       Follow up plan: Return in about 1 week (around 10/17/2018) for Wound check.

## 2018-10-11 ENCOUNTER — Ambulatory Visit: Payer: Self-pay

## 2018-10-11 ENCOUNTER — Ambulatory Visit: Payer: Self-pay | Admitting: Nurse Practitioner

## 2018-10-11 NOTE — Telephone Encounter (Signed)
Susan Houston called back and reports pt. Is seen by the pain clinic and needs to call them to prescribe for her.[name] states if they want her to prescribe a medicine short term, they can call her. This was relayed to pt. States she did not "sign a contract with them, but I will call them."

## 2018-10-11 NOTE — Telephone Encounter (Signed)
Pt. Called to report she is having pain to her left breast where she has a boil and is being treated for this. Calling to request "something for pain " be sent to her pharmacy. Reports she can not take Tylenol or Motrin. Message left with on-call provider to call back. Pt. Uses [facility] in [place].C.

## 2018-10-13 ENCOUNTER — Telehealth: Payer: Self-pay

## 2018-10-13 ENCOUNTER — Telehealth: Payer: Self-pay | Admitting: Family Medicine

## 2018-10-13 MED ORDER — TRAMADOL HCL 50 MG PO TABS: 50.0000 mg | ORAL_TABLET | Freq: Three times a day (TID) | ORAL | 0 refills | Status: DC | PRN

## 2018-10-13 NOTE — Telephone Encounter (Signed)
Rx sent  Copied from [place] [id number]. Topic: General - Other >> Oct 13, 2018 11:00 AM Windy Kalata wrote: Reason for CRM: Lori from Dr. Dossie Arbour office called and states that the Doctor is okay with the patient being prescribed a pain medication that they are not giving her anything as far as pain medications.  Best call back is [phone]

## 2018-10-13 NOTE — Telephone Encounter (Signed)
Pt would like it called in to [facility]. Reiterated not to take tramadol with her klonopin.

## 2018-10-13 NOTE — Telephone Encounter (Signed)
°  Lori from Dr. Michelle Nasuti office called and states that the dr is okay with her being prescribed a pain medication that they are not giving her any pain medications.        Cecille Rubin states that Ortha would like a call if they are going to prescribe her anything as she is out of town and will need to let them know where to send the prescription.

## 2018-10-13 NOTE — Telephone Encounter (Signed)
Please call and see where she needs this sent - I will give her some tramadol for her acute pain but she needs to be careful and not combine it with her klonopin

## 2018-10-13 NOTE — Telephone Encounter (Signed)
Would like to talk to Dr Cleda Daub nurse.

## 2018-10-13 NOTE — Telephone Encounter (Signed)
[facility].  Patient has cyst under left breast.  That provider is not giving pain medication d/t the fact that Susan Houston is a patient of Susan Houston.  Patient is seeing Susan Houston for this issue.  She would like for [place] to call and let them know that it is okay to prescribe pain medication.   Call to [facility] spoke with Susan Houston to let her know that it is okay for them to manage pain that is coming from the cyst under L breast. I also let her know that Susan Houston wanted me to let them know that she is out of town so if Susan Houston was going to call something in to please call her first so she give them a location to call in Rx.

## 2018-10-13 NOTE — Telephone Encounter (Signed)
Rx sent 

## 2018-10-17 ENCOUNTER — Telehealth: Payer: Self-pay | Admitting: Family Medicine

## 2018-10-17 NOTE — Telephone Encounter (Signed)
This does not appear to be back yet, but it was sent over a week ago- can we check on the status of this?

## 2018-10-17 NOTE — Telephone Encounter (Signed)
Copied from [place] [id number]. Topic: Quick Communication - Other Results (Clinic Use ONLY) >> Oct 17, 2018  3:17 PM Lennox Solders wrote: Pt is calling and would like wound result of left breast abscess

## 2018-10-18 ENCOUNTER — Ambulatory Visit: Payer: Self-pay | Admitting: Psychiatry

## 2018-10-18 NOTE — Telephone Encounter (Signed)
Spoke with patient. Message relayed. Patient stated that it is not much better. OV scheduled w/ Mrs. Rudene Anda for Thursday afternoon.

## 2018-10-18 NOTE — Telephone Encounter (Signed)
Please let patient know we were unable to process the specimen - is she doing any better? Let's see her back in the next day or two if she's not

## 2018-10-18 NOTE — Telephone Encounter (Signed)
Reviewed order on [facility]. States that it could not be processed because "the specimen submitted does not meet the laboratory's criteria for acceptability. Refer to [facility]'s Directory of Services for specimen acceptably criteria."   "Receive a dry swab, require a saw w/ amies gel 10/13/18 - [place]"

## 2018-10-18 NOTE — Telephone Encounter (Signed)
Please find out if she's better- see below.

## 2018-10-18 NOTE — Telephone Encounter (Signed)
Pt called back. °

## 2018-10-18 NOTE — Telephone Encounter (Signed)
Left message on machine for pt to return call to the office. CRM created.  

## 2018-10-19 HISTORY — PX: JOINT REPLACEMENT: SHX530

## 2018-10-20 ENCOUNTER — Ambulatory Visit (INDEPENDENT_AMBULATORY_CARE_PROVIDER_SITE_OTHER): Payer: Medicare HMO | Admitting: Family Medicine

## 2018-10-20 ENCOUNTER — Encounter: Payer: Self-pay | Admitting: Family Medicine

## 2018-10-20 VITALS — BP 142/84 | HR 72 | Temp 97.9°F | Ht 62.0 in | Wt 199.0 lb

## 2018-10-20 DIAGNOSIS — N611 Abscess of the breast and nipple: Secondary | ICD-10-CM | POA: Diagnosis not present

## 2018-10-20 MED ORDER — DOXYCYCLINE HYCLATE 100 MG PO TABS: 100.0000 mg | ORAL_TABLET | Freq: Two times a day (BID) | ORAL | 0 refills | Status: DC

## 2018-10-20 MED ORDER — TRAMADOL HCL 50 MG PO TABS: 50.0000 mg | ORAL_TABLET | Freq: Every evening | ORAL | 0 refills | Status: DC | PRN

## 2018-10-20 NOTE — Progress Notes (Signed)
BP (!) 142/84   Pulse 72   Temp 97.9 F (36.6 C) (Oral)   Ht 5\' 2"  (1.575 m)   Wt 199 lb (90.3 kg)   SpO2 98%   BMI 36.40 kg/m    Subjective:    Patient ID: Susan Houston, female    DOB: [birth year], 59 y.o.   MRN: [id number]  HPI: Susan Houston is a 58 y.o. female  Chief Complaint  Patient presents with  . Follow-up    Left breast abscess. Still very painful. Looks better today Would like Tramadol refill   Here today for 1 week recheck left breast abscess. Almost finished with doxycycline, noticing major improvement but still having a lot of pain. Taking tramadol prn for pain relief. No fevers, chills, sweats. Dressing twice daily with neosporin.   Relevant past medical, surgical, family and social history reviewed and updated as indicated. Interim medical history since our last visit reviewed. Allergies and medications reviewed and updated.  Review of Systems  Per HPI unless specifically indicated above     Objective:    BP (!) 142/84   Pulse 72   Temp 97.9 F (36.6 C) (Oral)   Ht 5\' 2"  (1.575 m)   Wt 199 lb (90.3 kg)   SpO2 98%   BMI 36.40 kg/m   Wt Readings from Last 3 Encounters:  10/20/18 199 lb (90.3 kg)  10/10/18 199 lb 3 oz (90.4 kg)  10/07/18 190 lb 4.8 oz (86.3 kg)    Physical Exam Vitals signs and nursing note reviewed.  Constitutional:      Appearance: Normal appearance. She is not ill-appearing.  HENT:     Head: Atraumatic.  Eyes:     Extraocular Movements: Extraocular movements intact.     Conjunctiva/sclera: Conjunctivae normal.  Neck:     Musculoskeletal: Normal range of motion and neck supple.  Cardiovascular:     Rate and Rhythm: Normal rate and regular rhythm.     Heart sounds: Normal heart sounds.  Pulmonary:     Effort: Pulmonary effort is normal.     Breath sounds: Normal breath sounds.  Musculoskeletal: Normal range of motion.  Skin:    General: Skin is warm and dry.     Comments: Abscess inferior to left breast significantly  improved. Minimally fluctuant, with small remaining area of erythema. Still significantly ttp  Neurological:     Mental Status: She is alert and oriented to person, place, and time.  Psychiatric:        Mood and Affect: Mood normal.        Thought Content: Thought content normal.        Judgment: Judgment normal.     Results for orders placed or performed during the hospital encounter of 09/11/18  Urinalysis, Complete w Microscopic  Result Value Ref Range   Color, Urine STRAW (A) YELLOW   APPearance CLEAR (A) CLEAR   Specific Gravity, Urine 1.003 (L) 1.005 - 1.030   pH 6.0 5.0 - 8.0   Glucose, UA NEGATIVE NEGATIVE mg/dL   Hgb urine dipstick MODERATE (A) NEGATIVE   Bilirubin Urine NEGATIVE NEGATIVE   Ketones, ur NEGATIVE NEGATIVE mg/dL   Protein, ur NEGATIVE NEGATIVE mg/dL   Nitrite NEGATIVE NEGATIVE   Leukocytes, UA NEGATIVE NEGATIVE   RBC / HPF 0-5 0 - 5 RBC/hpf   WBC, UA 0-5 0 - 5 WBC/hpf   Bacteria, UA RARE (A) NONE SEEN   Squamous Epithelial / LPF 0-5 0 - 5   Mucus  PRESENT   Urine Drug Screen, Qualitative  Result Value Ref Range   Tricyclic, Ur Screen NONE DETECTED NONE DETECTED   Amphetamines, Ur Screen NONE DETECTED NONE DETECTED   MDMA (Ecstasy)Ur Screen NONE DETECTED NONE DETECTED   Cocaine Metabolite,Ur [place] NONE DETECTED NONE DETECTED   Opiate, Ur Screen NONE DETECTED NONE DETECTED   Phencyclidine (PCP) Ur S NONE DETECTED NONE DETECTED   Cannabinoid 50 Ng, Ur [place] NONE DETECTED NONE DETECTED   Barbiturates, Ur Screen NONE DETECTED NONE DETECTED   Benzodiazepine, Ur Scrn NONE DETECTED NONE DETECTED   Methadone Scn, Ur NONE DETECTED NONE DETECTED  Comprehensive metabolic panel  Result Value Ref Range   Sodium 141 135 - 145 mmol/L   Potassium 3.5 3.5 - 5.1 mmol/L   Chloride 102 98 - 111 mmol/L   CO2 25 22 - 32 mmol/L   Glucose, Bld 129 (H) 70 - 99 mg/dL   BUN 11 6 - 20 mg/dL   Creatinine, Ser 0.71 0.44 - 1.00 mg/dL   Calcium 9.5 8.9 - 10.3 mg/dL   Total Protein  7.3 6.5 - 8.1 g/dL   Albumin 4.0 3.5 - 5.0 g/dL   AST 23 15 - 41 U/L   ALT 14 0 - 44 U/L   Alkaline Phosphatase 83 38 - 126 U/L   Total Bilirubin 0.6 0.3 - 1.2 mg/dL   GFR calc non Af Amer >60 >60 mL/min   GFR calc Af Amer >60 >60 mL/min   Anion gap 14 5 - 15  CBC with Differential  Result Value Ref Range   WBC 8.4 4.0 - 10.5 K/uL   RBC 4.62 3.87 - 5.11 MIL/uL   Hemoglobin 12.9 12.0 - 15.0 g/dL   HCT 39.3 36.0 - 46.0 %   MCV 85.1 80.0 - 100.0 fL   MCH 27.9 26.0 - 34.0 pg   MCHC 32.8 30.0 - 36.0 g/dL   RDW 13.6 11.5 - 15.5 %   Platelets 303 150 - 400 K/uL   nRBC 0.0 0.0 - 0.2 %   Neutrophils Relative % 58 %   Neutro Abs 4.9 1.7 - 7.7 K/uL   Lymphocytes Relative 32 %   Lymphs Abs 2.7 0.7 - 4.0 K/uL   Monocytes Relative 8 %   Monocytes Absolute 0.7 0.1 - 1.0 K/uL   Eosinophils Relative 1 %   Eosinophils Absolute 0.1 0.0 - 0.5 K/uL   Basophils Relative 1 %   Basophils Absolute 0.1 0.0 - 0.1 K/uL   Immature Granulocytes 0 %   Abs Immature Granulocytes 0.03 0.00 - 0.07 K/uL  Lactic acid, plasma  Result Value Ref Range   Lactic Acid, Venous 1.8 0.5 - 1.9 mmol/L  CK  Result Value Ref Range   Total CK 48 38 - 234 U/L  TSH  Result Value Ref Range   TSH 3.676 0.350 - 4.500 uIU/mL  T4, free  Result Value Ref Range   Free T4 1.12 0.82 - 1.77 ng/dL  Acetaminophen level  Result Value Ref Range   Acetaminophen (Tylenol), Serum <10 (L) 10 - 30 ug/mL  Salicylate level  Result Value Ref Range   Salicylate Lvl <4.8 2.8 - 30.0 mg/dL  Ethanol  Result Value Ref Range   Alcohol, Ethyl (B) <10 <10 mg/dL      Assessment & Plan:   Problem List Items Addressed This Visit    None    Visit Diagnoses    Left breast abscess    -  Primary   Improved, but  still lingering some. Refill doxycycline to take after competing 10 day course for 5 more days. Return precautions given    Very small supply of additional tramadol given that she knows need to last until this issue resolves. Knows  not to take alongside klonopin. Sedation and addiction potential discussed.   Follow up plan: Return for as scheduled in 1 week.

## 2018-10-23 DIAGNOSIS — Z96659 Presence of unspecified artificial knee joint: Secondary | ICD-10-CM | POA: Diagnosis not present

## 2018-10-23 DIAGNOSIS — C3490 Malignant neoplasm of unspecified part of unspecified bronchus or lung: Secondary | ICD-10-CM | POA: Diagnosis not present

## 2018-10-23 NOTE — Progress Notes (Signed)
Patient's Name: Susan Houston  MRN: [id number]  Referring Provider: Valerie Roys, DO  DOB: [birth year]  PCP: Valerie Roys, DO  DOS: 10/26/2018  Note by: Gaspar Cola, MD  Service setting: Ambulatory outpatient  Specialty: Interventional Pain Management  Location: [facility] (AMB) Pain Management Facility    Patient type: Established   Primary Reason(s) for Visit: Encounter for post-procedure evaluation of chronic illness with mild to moderate exacerbation CC: Flank Pain  HPI  Susan Houston is a 58 y.o. year old, female patient, who comes today for a post-procedure evaluation. She has Saphenous neuralgia; DDD (degenerative disc disease), lumbosacral; Sacroiliac joint dysfunction; DDD (degenerative disc disease), lumbar; Lumbar facet syndrome; Severe depression ([place]); Chronic anxiety; Coarse tremors; Hyperlipidemia; COPD (chronic obstructive pulmonary disease) ([place]); Migraine without aura and without status migrainosus, not intractable; Frequent falls; Mild cognitive impairment; Vitamin B12 deficiency; Laryngeal cancer ([place]); Mass of lower lobe of right lung; Lung cancer ([place]); Primary cancer of right lower lobe of lung ([place]); Controlled substance agreement signed; Rib pain (Primary Area of Pain) (Right); Incisional pain (Secondary Area of Pain); Chronic pain syndrome; Long term current use of opiate analgesic; Pharmacologic therapy; Disorder of skeletal system; Problems influencing health status; Post-thoracotomy pain syndrome; Chronic post-thoracotomy pain; Neurogenic pain; Vitamin D insufficiency; Chronic chest wall pain following surgery (Right); Intercostal pain (Right); Intercostal neuralgia; and Primary insomnia on their problem list. Her primarily concern today is the Flank Pain  Pain Assessment: Location: Right Flank Radiating: denies but have pain in center of chest Onset: More than a month ago Duration: Chronic pain Quality: Constant, Burning, Throbbing Severity: 8 /10 (subjective,  self-reported pain score)  Note: Reported level is inconsistent with clinical observations. Clinically the patient looks like a 2/10 A 2/10 is viewed as "Mild to Moderate" and described as noticeable and distracting. Impossible to hide from other people. More frequent flare-ups. Still possible to adapt and function close to normal. It can be very annoying and may have occasional stronger flare-ups. With discipline, patients may get used to it and adapt. Susan Houston does not seem to understand the use of our objective pain scale When using our objective Pain Scale, levels between 6 and 10/10 are said to belong in an emergency room, as it progressively worsens from a 6/10, described as severely limiting, requiring emergency care not usually available at an outpatient pain management facility. At a 6/10 level, communication becomes difficult and requires great effort. Assistance to reach the emergency department may be required. Facial flushing and profuse sweating along with potentially dangerous increases in heart rate and blood pressure will be evident. Effect on ADL: limits my daily activities Timing: Constant Modifying factors: procedures BP: 122/84  HR: 89  Susan Houston comes in today for post-procedure evaluation.  Further details on both, my assessment(s), as well as the proposed treatment plan, please see below.  Post-Procedure Assessment  09/29/2018 Procedure: Diagnostic right T5, T6, T7 intercostal nerve block#2under fluoroscopic guidance and IV sedation Pre-procedure pain score:  7/10 Post-procedure pain score: 0/10 (100% relief) Influential Factors: BMI: 35.30 kg/m Intra-procedural challenges: None observed.         Assessment challenges: None detected.              Reported side-effects: None.        Post-procedural adverse reactions or complications: None reported         Sedation: Sedation provided. When no sedatives are used, the analgesic levels obtained are directly associated to  the effectiveness of the local  anesthetics. However, when sedation is provided, the level of analgesia obtained during the initial 1 hour following the intervention, is believed to be the result of a combination of factors. These factors may include, but are not limited to: 1. The effectiveness of the local anesthetics used. 2. The effects of the analgesic(s) and/or anxiolytic(s) used. 3. The degree of discomfort experienced by the patient at the time of the procedure. 4. The patients ability and reliability in recalling and recording the events. 5. The presence and influence of possible secondary gains and/or psychosocial factors. Reported result: Relief experienced during the 1st hour after the procedure: 100 % (Ultra-Short Term Relief)            Interpretative annotation: Clinically appropriate result. Analgesia during this period is likely to be Local Anesthetic and/or IV Sedative (Analgesic/Anxiolytic) related.          Effects of local anesthetic: The analgesic effects attained during this period are directly associated to the localized infiltration of local anesthetics and therefore cary significant diagnostic value as to the etiological location, or anatomical origin, of the pain. Expected duration of relief is directly dependent on the pharmacodynamics of the local anesthetic used. Long-acting (4-6 hours) anesthetics used.  Reported result: Relief during the next 4 to 6 hour after the procedure: 100 % (Short-Term Relief)            Interpretative annotation: Clinically appropriate result. Analgesia during this period is likely to be Local Anesthetic-related.          Long-term benefit: Defined as the period of time past the expected duration of local anesthetics (1 hour for short-acting and 4-6 hours for long-acting). With the possible exception of prolonged sympathetic blockade from the local anesthetics, benefits during this period are typically attributed to, or associated with, other  factors such as analgesic sensory neuropraxia, antiinflammatory effects, or beneficial biochemical changes provided by agents other than the local anesthetics.  Reported result: Extended relief following procedure: 100 %(only last for 2 days) (Long-Term Relief)            Interpretative annotation: Clinically possible results. Good relief. No permanent benefit expected. Inflammation plays a part in the etiology to the pain.          Current benefits: Defined as reported results that persistent at this point in time.   Analgesia: >50 % Susan Houston reports improvement of dermatomal symptoms. Function: Somewhat improved ROM: Somewhat improved Interpretative annotation: Recurrence of symptoms. Limited therapeutic benefit. Results would suggest further treatment needed.          Interpretation: Results would suggest a successful diagnostic intervention.                  Plan:  Proceed with diagnostic procedure No.: 3          Laboratory Chemistry  Inflammation Markers (CRP: Acute Phase) (ESR: Chronic Phase) Lab Results  Component Value Date   CRP 6 08/18/2018   ESRSEDRATE 45 (H) 08/18/2018   LATICACIDVEN 1.8 09/11/2018                         Rheumatology Markers No results found.  Renal Markers Lab Results  Component Value Date   BUN 11 09/11/2018   CREATININE 0.71 09/11/2018   BCR 9 12/03/2017   GFRAA >60 09/11/2018   GFRNONAA >60 09/11/2018  Hepatic Markers Lab Results  Component Value Date   AST 23 09/11/2018   ALT 14 09/11/2018   ALBUMIN 4.0 09/11/2018                        Neuropathy Markers Lab Results  Component Value Date   VITAMINB12 405 08/18/2018                        Hematology Parameters Lab Results  Component Value Date   INR 1.13 05/12/2018   LABPROT 14.4 05/12/2018   APTT 32 05/12/2018   PLT 303 09/11/2018   HGB 12.9 09/11/2018   HCT 39.3 09/11/2018                        CV Markers Lab Results  Component Value Date    CKTOTAL 48 09/11/2018   TROPONINI <0.03 03/02/2018                         Note: Lab results reviewed.  Recent Imaging Results   Results for orders placed in visit on 09/29/18  DG C-Arm 1-60 Min-No Report   Narrative Fluoroscopy was utilized by the requesting physician.  No radiographic  interpretation.    Interpretation Report: Fluoroscopy was used during the procedure to assist with needle guidance. The images were interpreted intraoperatively by the requesting physician.  Meds   Current Outpatient Medications:  .  albuterol (PROVENTIL HFA;VENTOLIN HFA) 108 (90 Base) MCG/ACT inhaler, Inhale 2 puffs into the lungs every 4 (four) hours as needed for wheezing or shortness of breath., Disp: 1 Inhaler, Rfl: 2 .  aspirin 81 MG tablet, Take 81 mg by mouth daily., Disp: , Rfl:  .  clonazePAM (KLONOPIN) 1 MG tablet, Take 1 tablet (1 mg total) by mouth at bedtime., Disp: 30 tablet, Rfl: 0 .  gabapentin (NEURONTIN) 300 MG capsule, Take 1-3 capsules (300-900 mg total) by mouth 4 (four) times daily. Follow the written titration instructions. (Patient taking differently: Take 600 mg by mouth at bedtime. Follow the written titration instructions.), Disp: 360 capsule, Rfl: 2 .  QUEtiapine (SEROQUEL) 100 MG tablet, Take 2 tablets (200 mg total) by mouth at bedtime., Disp: 60 tablet, Rfl: 1 .  sulfamethoxazole-trimethoprim (BACTRIM) 400-80 MG tablet, Take 1 tablet by mouth 2 (two) times daily., Disp: 20 tablet, Rfl: 0 .  tiZANidine (ZANAFLEX) 4 MG tablet, Take 2 mg by mouth 3 (three) times daily., Disp: , Rfl:  .  traMADol (ULTRAM) 50 MG tablet, Take 1 tablet (50 mg total) by mouth at bedtime as needed., Disp: 5 tablet, Rfl: 0 .  traZODone (DESYREL) 50 MG tablet, Take 0.5-1 tablets (25-50 mg total) by mouth at bedtime as needed for sleep., Disp: 30 tablet, Rfl: 3 No current facility-administered medications for this visit.   Facility-Administered Medications Ordered in Other Visits:  .   ipratropium-albuterol (DUONEB) 0.5-2.5 (3) MG/3ML nebulizer solution 3 mL, 3 mL, Nebulization, Q6H, Burns, Jennifer E, NP  ROS  Constitutional: Denies any fever or chills Gastrointestinal: No reported hemesis, hematochezia, vomiting, or acute GI distress Musculoskeletal: Denies any acute onset joint swelling, redness, loss of ROM, or weakness Neurological: No reported episodes of acute onset apraxia, aphasia, dysarthria, agnosia, amnesia, paralysis, loss of coordination, or loss of consciousness  Allergies  Susan Houston is allergic to morphine and related; cymbalta [duloxetine hcl]; adhesive [tape]; and lexapro [escitalopram oxalate].  PFSH  Drug: Ms.  Houston  reports no history of drug use. Alcohol:  reports no history of alcohol use. Tobacco:  reports that she quit smoking about 2 years ago. Her smoking use included cigarettes. She smoked 0.50 packs per day. She has never used smokeless tobacco. Medical:  has a past medical history of Anemia, Anxiety, Arthritis, Asthma, Back pain, Bruises easily, Cancer ([place]) (2009), Chronic leg pain, COPD (chronic obstructive pulmonary disease) ([place]), Depression, Difficulty sleeping, Diverticulitis, Dysrhythmia (04/2018), Facet syndrome, lumbar (03/28/2015), Hemorrhoids, History of kidney stones (04/2018), Hyperlipidemia, Hypertension (2019), Incontinence of urine, Laryngeal cancer ([place]) (2009), Lung cancer ([place]) (04/2018), Migraine, Migraine without aura and without status migrainosus, not intractable (09/11/2016), Mild cognitive impairment (11/23/2017), MRSA infection greater than 3 months ago, Nerve damage, Nodule of lower lobe of right lung (04/22/2018), Oxygen deficiency, and Wears dentures. Surgical: Susan Houston  has a past surgical history that includes Bladder suspension (2014); Tonsillectomy; Excision neuroma (Bilateral, 09/24/2014); Esophagogastroduodenoscopy (2015); Colonoscopy; Esophagogastroduodenoscopy (egd) with propofol (N/A, 11/20/2016); Joint replacement  (2009/2010); Abdominal hysterectomy (1983); Eye surgery (Left, 05/11/2018); Cholecystectomy (1998); Cataract extraction w/PHACO (Left, 05/11/2018); Thoracotomy/lobectomy (Right, 05/16/2018); Video bronchoscopy (N/A, 05/16/2018); and Cataract extraction w/PHACO (Right, 08/11/2018). Family: family history includes Alcohol abuse in her brother; Alzheimer's disease in her paternal grandfather; Anxiety disorder in her sister; Arthritis in her father; Asthma in her mother; Breast cancer (age of onset: 70) in her maternal grandmother; Cancer in her father and maternal grandfather; Cirrhosis in her mother; Depression in her sister; Diabetes in her mother and sister; Gout in her sister; Heart disease in her brother; Hyperlipidemia in her father and mother; Hypertension in her father, mother, and sister; Kidney Stones in her son; Pneumonia in her paternal grandmother; Stroke in her maternal grandmother.  Constitutional Exam  General appearance: Well nourished, well developed, and well hydrated. In no apparent acute distress Vitals:   10/26/18 1403  BP: 122/84  Pulse: 89  Temp: 98.2 F (36.8 C)  SpO2: 97%  Weight: 193 lb (87.5 kg)  Height: _0  (1.575 m)   BMI Assessment: Estimated body mass index is 35.3 kg/m as calculated from the following:   Height as of this encounter: _1  (1.575 m).   Weight as of this encounter: 193 lb (87.5 kg).  BMI interpretation table: BMI level Category Range association with higher incidence of chronic pain  <18 kg/m2 Underweight   18.5-24.9 kg/m2 Ideal body weight   25-29.9 kg/m2 Overweight Increased incidence by 20%  30-34.9 kg/m2 Obese (Class I) Increased incidence by 68%  35-39.9 kg/m2 Severe obesity (Class II) Increased incidence by 136%  >40 kg/m2 Extreme obesity (Class III) Increased incidence by 254%   Patient's current BMI Ideal Body weight  Body mass index is 35.3 kg/m. Ideal body weight: 50.1 kg (110 lb 7.2 oz) Adjusted ideal body weight: 65.1 kg (143 lb  7.5 oz)   BMI Readings from Last 4 Encounters:  10/26/18 35.30 kg/m  10/25/18 36.21 kg/m  10/20/18 36.40 kg/m  10/10/18 36.43 kg/m   Wt Readings from Last 4 Encounters:  10/26/18 193 lb (87.5 kg)  10/25/18 198 lb (89.8 kg)  10/20/18 199 lb (90.3 kg)  10/10/18 199 lb 3 oz (90.4 kg)  Psych/Mental status: Alert, oriented x 3 (person, place, & time)       Eyes: PERLA Respiratory: No evidence of acute respiratory distress  Cervical Spine Area Exam  Skin & Axial Inspection: No masses, redness, edema, swelling, or associated skin lesions Alignment: Symmetrical Functional ROM: Unrestricted ROM      Stability: No  instability detected Muscle Tone/Strength: Functionally intact. No obvious neuro-muscular anomalies detected. Sensory (Neurological): Unimpaired Palpation: No palpable anomalies              Upper Extremity (UE) Exam    Side: Right upper extremity  Side: Left upper extremity  Skin & Extremity Inspection: Skin color, temperature, and hair growth are WNL. No peripheral edema or cyanosis. No masses, redness, swelling, asymmetry, or associated skin lesions. No contractures.  Skin & Extremity Inspection: Skin color, temperature, and hair growth are WNL. No peripheral edema or cyanosis. No masses, redness, swelling, asymmetry, or associated skin lesions. No contractures.  Functional ROM: Unrestricted ROM          Functional ROM: Unrestricted ROM          Muscle Tone/Strength: Functionally intact. No obvious neuro-muscular anomalies detected.  Muscle Tone/Strength: Functionally intact. No obvious neuro-muscular anomalies detected.  Sensory (Neurological): Unimpaired          Sensory (Neurological): Unimpaired          Palpation: No palpable anomalies              Palpation: No palpable anomalies              Provocative Test(s):  Phalen's test: deferred Tinel's test: deferred Apley's scratch test (touch opposite shoulder):  Action 1 (Across chest): deferred Action 2 (Overhead):  deferred Action 3 (LB reach): deferred   Provocative Test(s):  Phalen's test: deferred Tinel's test: deferred Apley's scratch test (touch opposite shoulder):  Action 1 (Across chest): deferred Action 2 (Overhead): deferred Action 3 (LB reach): deferred    Thoracic Spine Area Exam  Skin & Axial Inspection: No masses, redness, or swelling Alignment: Symmetrical Functional ROM: Unrestricted ROM Stability: No instability detected Muscle Tone/Strength: Functionally intact. No obvious neuro-muscular anomalies detected. Sensory (Neurological): Unimpaired Muscle strength & Tone: No palpable anomalies  Lumbar Spine Area Exam  Skin & Axial Inspection: No masses, redness, or swelling Alignment: Symmetrical Functional ROM: Unrestricted ROM       Stability: No instability detected Muscle Tone/Strength: Functionally intact. No obvious neuro-muscular anomalies detected. Sensory (Neurological): Unimpaired Palpation: No palpable anomalies       Provocative Tests: Hyperextension/rotation test: deferred today       Lumbar quadrant test (Kemp's test): deferred today       Lateral bending test: deferred today       Patrick's Maneuver: deferred today                   FABER* test: deferred today                   S-I anterior distraction/compression test: deferred today         S-I lateral compression test: deferred today         S-I Thigh-thrust test: deferred today         S-I Gaenslen's test: deferred today         *(Flexion, ABduction and External Rotation)  Gait & Posture Assessment  Ambulation: Unassisted Gait: Relatively normal for age and body habitus Posture: WNL   Lower Extremity Exam    Side: Right lower extremity  Side: Left lower extremity  Stability: No instability observed          Stability: No instability observed          Skin & Extremity Inspection: Skin color, temperature, and hair growth are WNL. No peripheral edema or cyanosis. No masses, redness, swelling, asymmetry,  or associated skin lesions.  No contractures.  Skin & Extremity Inspection: Skin color, temperature, and hair growth are WNL. No peripheral edema or cyanosis. No masses, redness, swelling, asymmetry, or associated skin lesions. No contractures.  Functional ROM: Unrestricted ROM                  Functional ROM: Unrestricted ROM                  Muscle Tone/Strength: Functionally intact. No obvious neuro-muscular anomalies detected.  Muscle Tone/Strength: Functionally intact. No obvious neuro-muscular anomalies detected.  Sensory (Neurological): Unimpaired        Sensory (Neurological): Unimpaired        DTR: Patellar: deferred today Achilles: deferred today Plantar: deferred today  DTR: Patellar: deferred today Achilles: deferred today Plantar: deferred today  Palpation: No palpable anomalies  Palpation: No palpable anomalies   Assessment  Primary Diagnosis & Pertinent Problem List: The primary encounter diagnosis was Rib pain (Primary Area of Pain) (Right). Diagnoses of Incisional pain (Secondary Area of Pain), Intercostal neuralgia, Chronic post-thoracotomy pain, and Intercostal pain (Right) were also pertinent to this visit.  Status Diagnosis  Controlled Controlled Controlled 1. Rib pain (Primary Area of Pain) (Right)   2. Incisional pain (Secondary Area of Pain)   3. Intercostal neuralgia   4. Chronic post-thoracotomy pain   5. Intercostal pain (Right)     Problems updated and reviewed during this visit: No problems updated. Plan of Care  Pharmacotherapy (Medications Ordered): No orders of the defined types were placed in this encounter.  Medications administered today: Susan Houston had no medications administered during this visit.   Procedure Orders     INTERCOSTAL NERVE BLOCK Lab Orders  No laboratory test(s) ordered today   Imaging Orders  No imaging studies ordered today   Referral Orders  No referral(s) requested today   Interventional management  options: Planned, scheduled, and/or pending:   Therapeutic right T5, T6, T7 intercostal nerve BLK #3+ diagnostic costochondral injection #1, under fluoroscopic guidance and IV sedation   Considering:   Diagnostic right T5, T6, T7 intercostal nerve block #3 Possible right T5, T6, T7 intercostal nerve RFA    Palliative PRN treatment(s):   None at this time   Provider-requested follow-up: Return for Procedure (w/ sedation): (R) T5, T6, T7 intercostal nerve BLK #3 + Chostocondral injections #1.  Future Appointments  Date Time Provider [place]  11/04/2018 10:00 AM [facility]  11/04/2018  3:30 PM Valerie Roys, DO [facility]  11/07/2018  1:30 PM [facility] LAB [facility] None  11/07/2018  2:00 PM Cammie Sickle, MD [facility] None   Primary Care Physician: Valerie Roys, DO Location: [facility] Outpatient Pain Management Facility Note by: Gaspar Cola, MD Date: 10/26/2018; Time: 2:55 PM

## 2018-10-25 ENCOUNTER — Ambulatory Visit (INDEPENDENT_AMBULATORY_CARE_PROVIDER_SITE_OTHER): Payer: Medicare HMO | Admitting: Family Medicine

## 2018-10-25 ENCOUNTER — Encounter: Payer: Self-pay | Admitting: Family Medicine

## 2018-10-25 ENCOUNTER — Other Ambulatory Visit: Payer: Self-pay | Admitting: Family Medicine

## 2018-10-25 VITALS — BP 122/83 | HR 80 | Temp 97.9°F | Ht 62.0 in | Wt 198.0 lb

## 2018-10-25 DIAGNOSIS — N611 Abscess of the breast and nipple: Secondary | ICD-10-CM | POA: Diagnosis not present

## 2018-10-25 DIAGNOSIS — F5101 Primary insomnia: Secondary | ICD-10-CM | POA: Diagnosis not present

## 2018-10-25 MED ORDER — SULFAMETHOXAZOLE-TRIMETHOPRIM 400-80 MG PO TABS: 1.0000 | ORAL_TABLET | Freq: Two times a day (BID) | ORAL | 0 refills | Status: DC

## 2018-10-25 MED ORDER — CLONAZEPAM 1 MG PO TABS: 1.0000 mg | ORAL_TABLET | Freq: Every day | ORAL | 0 refills | Status: DC

## 2018-10-25 NOTE — Assessment & Plan Note (Signed)
No-showed her psychiatry appointment. Still only sleeping less than 2 hours on her klonopin, trazodone, gabapentin and 1mg  of klonopin. I'm not comfortable increasing her klonopin dose. Will maintain her seroquel dose at 200mg  qHS. Will refill her klonopin for 1 month. Will need to see psychiatry ASAP.

## 2018-10-25 NOTE — Progress Notes (Addendum)
BP 122/83   Pulse 80   Temp 97.9 F (36.6 C) (Oral)   Ht 5\' 2"  (1.575 m)   Wt 198 lb (89.8 kg)   SpO2 97%   BMI 36.21 kg/m    Subjective:    Patient ID: Susan Houston, female    DOB: [birth year], 59 y.o.   MRN: [id number]  HPI: Susan Houston is a 58 y.o. female  Chief Complaint  Patient presents with  . Abscess    Left breast. Improving.   . Medication Management    Out of klonopin and pain medication.    INSOMNIA- only sleeping an hour and 20 minutes since her last appointment. No-showed her psychiatry appointment because she was out of town doing [place] with the 200mg  of the seroquel. Duration: chronic Satisfied with sleep quality: no Difficulty falling asleep: yes Difficulty staying asleep: yes Waking a few hours after sleep onset: yes Early morning awakenings: yes Daytime hypersomnolence: yes Wakes feeling refreshed: no Good sleep hygiene: no Apnea: no Snoring: no Depressed/anxious mood: yes Recent stress: yes Restless legs/nocturnal leg cramps: no Chronic pain/arthritis: yes History of sleep study: no Treatments attempted: melatonin, uinsom, benadryl and ambien   Depression screen [facility] 2/9 10/25/2018 09/29/2018 09/16/2018 09/08/2018 08/29/2018  Decreased Interest 1 0 2 0 0  Down, Depressed, Hopeless 0 0 0 0 0  PHQ - 2 Score 1 0 2 0 0  Altered sleeping 3 3 3  - -  Tired, decreased energy 2 1 3  - -  Change in appetite 1 1 2  - -  Feeling bad or failure about yourself  0 0 0 - -  Trouble concentrating 0 0 3 - -  Moving slowly or fidgety/restless 0 0 0 - -  Suicidal thoughts 0 0 0 - -  PHQ-9 Score 7 5 13  - -  Difficult doing work/chores Somewhat difficult Not difficult at all Somewhat difficult - -  Some recent data might be hidden   GAD 7 : Generalized Anxiety Score 10/25/2018 09/16/2018 07/13/2018 06/28/2018  Nervous, Anxious, on Edge 2 3 3 1   Control/stop worrying 1 0 2 1  Worry too much - different things 1 0 2 1  Trouble relaxing 2 2 3  0  Restless 2 2 3  0    Easily annoyed or irritable 1 2 3  0  Afraid - awful might happen 0 0 3 3  Total GAD 7 Score 9 9 19 6   Anxiety Difficulty Somewhat difficult Very difficult Somewhat difficult Somewhat difficult   Abscess is getting better. Hurting a little bit- finished her antibiotics.   Relevant past medical, surgical, family and social history reviewed and updated as indicated. Interim medical history since our last visit reviewed. Allergies and medications reviewed and updated.  Review of Systems  Constitutional: Negative.   Respiratory: Negative.   Cardiovascular: Negative.   Musculoskeletal: Negative.   Skin: Positive for wound. Negative for color change, pallor and rash.  Psychiatric/Behavioral: Positive for dysphoric mood and sleep disturbance. Negative for agitation, behavioral problems, confusion, decreased concentration, hallucinations, self-injury and suicidal ideas. The patient is nervous/anxious. The patient is not hyperactive.     Per HPI unless specifically indicated above     Objective:    BP 122/83   Pulse 80   Temp 97.9 F (36.6 C) (Oral)   Ht 5\' 2"  (1.575 m)   Wt 198 lb (89.8 kg)   SpO2 97%   BMI 36.21 kg/m   Wt Readings from Last 3 Encounters:  10/25/18 198 lb (  89.8 kg)  10/20/18 199 lb (90.3 kg)  10/10/18 199 lb 3 oz (90.4 kg)    Physical Exam Vitals signs and nursing note reviewed. Exam conducted with a chaperone present.  Constitutional:      General: She is not in acute distress.    Appearance: Normal appearance. She is not ill-appearing, toxic-appearing or diaphoretic.  HENT:     Head: Normocephalic and atraumatic.     Right Ear: External ear normal.     Left Ear: External ear normal.     Nose: Nose normal.     Mouth/Throat:     Mouth: Mucous membranes are moist.     Pharynx: Oropharynx is clear.  Eyes:     General: No scleral icterus.       Right eye: No discharge.        Left eye: No discharge.     Extraocular Movements: Extraocular movements intact.      Conjunctiva/sclera: Conjunctivae normal.     Pupils: Pupils are equal, round, and reactive to light.  Neck:     Musculoskeletal: Normal range of motion and neck supple.  Cardiovascular:     Rate and Rhythm: Normal rate and regular rhythm.     Pulses: Normal pulses.     Heart sounds: Normal heart sounds. No murmur. No friction rub. No gallop.   Pulmonary:     Effort: Pulmonary effort is normal. No respiratory distress.     Breath sounds: Normal breath sounds. No stridor. No wheezing, rhonchi or rales.  Chest:     Chest wall: No tenderness.    Musculoskeletal: Normal range of motion.  Skin:    General: Skin is warm and dry.     Capillary Refill: Capillary refill takes less than 2 seconds.     Coloration: Skin is not jaundiced or pale.     Findings: No bruising, erythema, lesion or rash.  Neurological:     General: No focal deficit present.     Mental Status: She is alert and oriented to person, place, and time. Mental status is at baseline.  Psychiatric:        Mood and Affect: Mood normal.        Behavior: Behavior normal.        Thought Content: Thought content normal.        Judgment: Judgment normal.     Results for orders placed or performed during the hospital encounter of 09/11/18  Urinalysis, Complete w Microscopic  Result Value Ref Range   Color, Urine STRAW (A) YELLOW   APPearance CLEAR (A) CLEAR   Specific Gravity, Urine 1.003 (L) 1.005 - 1.030   pH 6.0 5.0 - 8.0   Glucose, UA NEGATIVE NEGATIVE mg/dL   Hgb urine dipstick MODERATE (A) NEGATIVE   Bilirubin Urine NEGATIVE NEGATIVE   Ketones, ur NEGATIVE NEGATIVE mg/dL   Protein, ur NEGATIVE NEGATIVE mg/dL   Nitrite NEGATIVE NEGATIVE   Leukocytes, UA NEGATIVE NEGATIVE   RBC / HPF 0-5 0 - 5 RBC/hpf   WBC, UA 0-5 0 - 5 WBC/hpf   Bacteria, UA RARE (A) NONE SEEN   Squamous Epithelial / LPF 0-5 0 - 5   Mucus PRESENT   Urine Drug Screen, Qualitative  Result Value Ref Range   Tricyclic, Ur Screen NONE DETECTED  NONE DETECTED   Amphetamines, Ur Screen NONE DETECTED NONE DETECTED   MDMA (Ecstasy)Ur Screen NONE DETECTED NONE DETECTED   Cocaine Metabolite,Ur [place] NONE DETECTED NONE DETECTED   Opiate, Ur Screen NONE DETECTED  NONE DETECTED   Phencyclidine (PCP) Ur S NONE DETECTED NONE DETECTED   Cannabinoid 50 Ng, Ur [place] NONE DETECTED NONE DETECTED   Barbiturates, Ur Screen NONE DETECTED NONE DETECTED   Benzodiazepine, Ur Scrn NONE DETECTED NONE DETECTED   Methadone Scn, Ur NONE DETECTED NONE DETECTED  Comprehensive metabolic panel  Result Value Ref Range   Sodium 141 135 - 145 mmol/L   Potassium 3.5 3.5 - 5.1 mmol/L   Chloride 102 98 - 111 mmol/L   CO2 25 22 - 32 mmol/L   Glucose, Bld 129 (H) 70 - 99 mg/dL   BUN 11 6 - 20 mg/dL   Creatinine, Ser 0.71 0.44 - 1.00 mg/dL   Calcium 9.5 8.9 - 10.3 mg/dL   Total Protein 7.3 6.5 - 8.1 g/dL   Albumin 4.0 3.5 - 5.0 g/dL   AST 23 15 - 41 U/L   ALT 14 0 - 44 U/L   Alkaline Phosphatase 83 38 - 126 U/L   Total Bilirubin 0.6 0.3 - 1.2 mg/dL   GFR calc non Af Amer >60 >60 mL/min   GFR calc Af Amer >60 >60 mL/min   Anion gap 14 5 - 15  CBC with Differential  Result Value Ref Range   WBC 8.4 4.0 - 10.5 K/uL   RBC 4.62 3.87 - 5.11 MIL/uL   Hemoglobin 12.9 12.0 - 15.0 g/dL   HCT 39.3 36.0 - 46.0 %   MCV 85.1 80.0 - 100.0 fL   MCH 27.9 26.0 - 34.0 pg   MCHC 32.8 30.0 - 36.0 g/dL   RDW 13.6 11.5 - 15.5 %   Platelets 303 150 - 400 K/uL   nRBC 0.0 0.0 - 0.2 %   Neutrophils Relative % 58 %   Neutro Abs 4.9 1.7 - 7.7 K/uL   Lymphocytes Relative 32 %   Lymphs Abs 2.7 0.7 - 4.0 K/uL   Monocytes Relative 8 %   Monocytes Absolute 0.7 0.1 - 1.0 K/uL   Eosinophils Relative 1 %   Eosinophils Absolute 0.1 0.0 - 0.5 K/uL   Basophils Relative 1 %   Basophils Absolute 0.1 0.0 - 0.1 K/uL   Immature Granulocytes 0 %   Abs Immature Granulocytes 0.03 0.00 - 0.07 K/uL  Lactic acid, plasma  Result Value Ref Range   Lactic Acid, Venous 1.8 0.5 - 1.9 mmol/L  CK    Result Value Ref Range   Total CK 48 38 - 234 U/L  TSH  Result Value Ref Range   TSH 3.676 0.350 - 4.500 uIU/mL  T4, free  Result Value Ref Range   Free T4 1.12 0.82 - 1.77 ng/dL  Acetaminophen level  Result Value Ref Range   Acetaminophen (Tylenol), Serum <10 (L) 10 - 30 ug/mL  Salicylate level  Result Value Ref Range   Salicylate Lvl <1.2 2.8 - 30.0 mg/dL  Ethanol  Result Value Ref Range   Alcohol, Ethyl (B) <10 <10 mg/dL      Assessment & Plan:   Problem List Items Addressed This Visit      Other   Primary insomnia    No-showed her psychiatry appointment. Still only sleeping less than 2 hours on her klonopin, trazodone, gabapentin and 1mg  of klonopin. I'm not comfortable increasing her klonopin dose. Will maintain her seroquel dose at 200mg  qHS. Will refill her klonopin for 1 month. Will need to see psychiatry ASAP.        Other Visit Diagnoses    Left breast abscess    -  Primary   Smaller, non-fluctuant, still small amount of pus draining. Will treat with bactrim. No more tramadol. Call with any concerns.        Follow up plan: Return in about 1 week (around 11/01/2018) for recheck abscess.

## 2018-10-25 NOTE — Telephone Encounter (Signed)
Copied from [place] [id number]. Topic: Quick Communication - Rx Refill/Question >> Oct 25, 2018  1:44 PM [place], [place] D wrote: Medication: clonazePAM (KLONOPIN) 1 MG tablet / Pt stated she had an OV today. Refill for klonopin was not sent in. Please advise.  Has the patient contacted their pharmacy? Yes.   (Agent: If no, request that the patient contact the pharmacy for the refill.) (Agent: If yes, when and what did the pharmacy advise?)  Preferred Pharmacy (with phone number or street name): [id number] [address], [place] - [address] [phone] (Phone) [phone] (Fax)    Agent: Please be advised that RX refills may take up to 3 business days. We ask that you follow-up with your pharmacy.

## 2018-10-25 NOTE — Telephone Encounter (Signed)
Requested medication (s) are due for refill today: yes  Requested medication (s) are on the active medication list: yes  Last refill:  10/04/18  Future visit scheduled: yes  Notes to clinic: not delegated to NT to fill   Requested Prescriptions  Pending Prescriptions Disp Refills   clonazePAM (KLONOPIN) 1 MG tablet 20 tablet 0    Sig: Take 1 tablet (1 mg total) by mouth at bedtime.     Not Delegated - Psychiatry:  Anxiolytics/Hypnotics Failed - 10/25/2018  1:54 PM      Failed - This refill cannot be delegated      Passed - Urine Drug Screen completed in last 360 days.      Passed - Valid encounter within last 6 months    Recent Outpatient Visits          Today Left breast abscess   [facility] [place], [place], DO   5 days ago Left breast abscess   [facility] Merrie Roof [place], [place]   2 weeks ago Left breast abscess   [facility] [place], [place]   2 weeks ago [facility], [place], [place]   3 weeks ago Primary insomnia   [place], [place], DO      Future Appointments            In 1 week Wynetta Emery, Barb Merino, DO [employer], [facility]

## 2018-10-26 ENCOUNTER — Ambulatory Visit: Payer: Medicare HMO | Attending: Pain Medicine | Admitting: Pain Medicine

## 2018-10-26 ENCOUNTER — Encounter: Payer: Self-pay | Admitting: Pain Medicine

## 2018-10-26 ENCOUNTER — Other Ambulatory Visit: Payer: Self-pay

## 2018-10-26 VITALS — BP 122/84 | HR 89 | Temp 98.2°F | Ht 62.0 in | Wt 193.0 lb

## 2018-10-26 DIAGNOSIS — R0782 Intercostal pain: Secondary | ICD-10-CM

## 2018-10-26 DIAGNOSIS — G588 Other specified mononeuropathies: Secondary | ICD-10-CM | POA: Diagnosis not present

## 2018-10-26 DIAGNOSIS — G8922 Chronic post-thoracotomy pain: Secondary | ICD-10-CM | POA: Diagnosis not present

## 2018-10-26 DIAGNOSIS — L7682 Other postprocedural complications of skin and subcutaneous tissue: Secondary | ICD-10-CM | POA: Diagnosis not present

## 2018-10-26 DIAGNOSIS — R0781 Pleurodynia: Secondary | ICD-10-CM | POA: Diagnosis not present

## 2018-10-26 NOTE — Patient Instructions (Addendum)
______________________________________________________________________________________________  Specialty Pain Scale  Introduction:  There are significant differences in how pain is reported. The word pain usually refers to physical pain, but it is also a common synonym of suffering. The medical community uses a scale from 0 (zero) to 10 (ten) to report pain level. Zero (0) is described as "no pain", while ten (10) is described as "the worse pain you can imagine". The problem with this scale is that physical pain is reported along with suffering. Suffering refers to mental pain, or more often yet it refers to any unpleasant feeling, emotion or aversion associated with the perception of harm or threat of harm. It is the psychological component of pain.  Pain Specialists prefer to separate the two components. The pain scale used by this practice is the Verbal Numerical Rating Scale (VNRS-11). This scale is for the physical pain only. DO NOT INCLUDE how your pain psychologically affects you. This scale is for adults 21 years of age and older. It has 11 (eleven) levels. The 1st level is 0/10. This means: "right now, I have no pain". In the context of pain management, it also means: "right now, my physical pain is under control with the current therapy".  General Information:  The scale should reflect your current level of pain. Unless you are specifically asked for the level of your worst pain, or your average pain. If you are asked for one of these two, then it should be understood that it is over the past 24 hours.  Levels 1 (one) through 5 (five) are described below, and can be treated as an outpatient. Ambulatory pain management facilities such as ours are more than adequate to treat these levels. Levels 6 (six) through 10 (ten) are also described below, however, these must be treated as a hospitalized patient. While levels 6 (six) and 7 (seven) may be evaluated at an urgent care facility, levels 8  (eight) through 10 (ten) constitute medical emergencies and as such, they belong in a hospital's emergency department. When having these levels (as described below), do not come to our office. Our facility is not equipped to manage these levels. Go directly to an urgent care facility or an emergency department to be evaluated.  Definitions:  Activities of Daily Living (ADL): Activities of daily living (ADL or ADLs) is a term used in healthcare to refer to people's daily self-care activities. Health professionals often use a person's ability or inability to perform ADLs as a measurement of their functional status, particularly in regard to people post injury, with disabilities and the elderly. There are two ADL levels: Basic and Instrumental. Basic Activities of Daily Living (BADL  or BADLs) consist of self-care tasks that include: Bathing and showering; personal hygiene and grooming (including brushing/combing/styling hair); dressing; Toilet hygiene (getting to the toilet, cleaning oneself, and getting back up); eating and self-feeding (not including cooking or chewing and swallowing); functional mobility, often referred to as "transferring", as measured by the ability to walk, get in and out of bed, and get into and out of a chair; the broader definition (moving from one place to another while performing activities) is useful for people with different physical abilities who are still able to get around independently. Basic ADLs include the things many people do when they get up in the morning and get ready to go out of the house: get out of bed, go to the toilet, bathe, dress, groom, and eat. On the average, loss of function typically follows a particular order.   Hygiene is the first to go, followed by loss of toilet use and locomotion. The last to go is the ability to eat. When there is only one remaining area in which the person is independent, there is a 62.9% chance that it is eating and only a 3.5% chance  that it is hygiene. Instrumental Activities of Daily Living (IADL or IADLs) are not necessary for fundamental functioning, but they let an individual live independently in a community. IADL consist of tasks that include: cleaning and maintaining the house; home establishment and maintenance; care of others (including selecting and supervising caregivers); care of pets; child rearing; managing money; managing financials (investments, etc.); meal preparation and cleanup; shopping for groceries and necessities; moving within the community; safety procedures and emergency responses; health management and maintenance (taking prescribed medications); and using the telephone or other form of communication.  Instructions:  Most patients tend to report their pain as a combination of two factors, their physical pain and their psychosocial pain. This last one is also known as "suffering" and it is reflection of how physical pain affects you socially and psychologically. From now on, report them separately.  From this point on, when asked to report your pain level, report only your physical pain. Use the following table for reference.  Pain Clinic Pain Levels (0-5/10)  Pain Level Score  Description  No Pain 0   Mild pain 1 Nagging, annoying, but does not interfere with basic activities of daily living (ADL). Patients are able to eat, bathe, get dressed, toileting (being able to get on and off the toilet and perform personal hygiene functions), transfer (move in and out of bed or a chair without assistance), and maintain continence (able to control bladder and bowel functions). Blood pressure and heart rate are unaffected. A normal heart rate for a healthy adult ranges from 60 to 100 bpm (beats per minute).   Mild to moderate pain 2 Noticeable and distracting. Impossible to hide from other people. More frequent flare-ups. Still possible to adapt and function close to normal. It can be very annoying and may have  occasional stronger flare-ups. With discipline, patients may get used to it and adapt.   Moderate pain 3 Interferes significantly with activities of daily living (ADL). It becomes difficult to feed, bathe, get dressed, get on and off the toilet or to perform personal hygiene functions. Difficult to get in and out of bed or a chair without assistance. Very distracting. With effort, it can be ignored when deeply involved in activities.   Moderately severe pain 4 Impossible to ignore for more than a few minutes. With effort, patients may still be able to manage work or participate in some social activities. Very difficult to concentrate. Signs of autonomic nervous system discharge are evident: dilated pupils (mydriasis); mild sweating (diaphoresis); sleep interference. Heart rate becomes elevated (>115 bpm). Diastolic blood pressure (lower number) rises above 100 mmHg. Patients find relief in laying down and not moving.   Severe pain 5 Intense and extremely unpleasant. Associated with frowning face and frequent crying. Pain overwhelms the senses.  Ability to do any activity or maintain social relationships becomes significantly limited. Conversation becomes difficult. Pacing back and forth is common, as getting into a comfortable position is nearly impossible. Pain wakes you up from deep sleep. Physical signs will be obvious: pupillary dilation; increased sweating; goosebumps; brisk reflexes; cold, clammy hands and feet; nausea, vomiting or dry heaves; loss of appetite; significant sleep disturbance with inability to fall asleep or to   remain asleep. When persistent, significant weight loss is observed due to the complete loss of appetite and sleep deprivation.  Blood pressure and heart rate becomes significantly elevated. Caution: If elevated blood pressure triggers a pounding headache associated with blurred vision, then the patient should immediately seek attention at an urgent or emergency care unit, as  these may be signs of an impending stroke.    Emergency Department Pain Levels (6-10/10)  Emergency Room Pain 6 Severely limiting. Requires emergency care and should not be seen or managed at an outpatient pain management facility. Communication becomes difficult and requires great effort. Assistance to reach the emergency department may be required. Facial flushing and profuse sweating along with potentially dangerous increases in heart rate and blood pressure will be evident.   Distressing pain 7 Self-care is very difficult. Assistance is required to transport, or use restroom. Assistance to reach the emergency department will be required. Tasks requiring coordination, such as bathing and getting dressed become very difficult.   Disabling pain 8 Self-care is no longer possible. At this level, pain is disabling. The individual is unable to do even the most "basic" activities such as walking, eating, bathing, dressing, transferring to a bed, or toileting. Fine motor skills are lost. It is difficult to think clearly.   Incapacitating pain 9 Pain becomes incapacitating. Thought processing is no longer possible. Difficult to remember your own name. Control of movement and coordination are lost.   The worst pain imaginable 10 At this level, most patients pass out from pain. When this level is reached, collapse of the autonomic nervous system occurs, leading to a sudden drop in blood pressure and heart rate. This in turn results in a temporary and dramatic drop in blood flow to the brain, leading to a loss of consciousness. Fainting is one of the body's self defense mechanisms. Passing out puts the brain in a calmed state and causes it to shut down for a while, in order to begin the healing process.    Summary: 1. Refer to this scale when providing [place] with your pain level. 2. Be accurate and careful when reporting your pain level. This will help with your care. 3. Over-reporting your pain level will  lead to loss of credibility. 4. Even a level of 1/10 means that there is pain and will be treated at our facility. 5. High, inaccurate reporting will be documented as "Symptom Exaggeration", leading to loss of credibility and suspicions of possible secondary gains such as obtaining more narcotics, or wanting to appear disabled, for fraudulent reasons. 6. Only pain levels of 5 or below will be seen at our facility. 7. Pain levels of 6 and above will be sent to the Emergency Department and the appointment cancelled. ______________________________________________________________________________________________   ____________________________________________________________________________________________  Preparing for Procedure with Sedation  Instructions: . Oral Intake: Do not eat or drink anything for at least 8 hours prior to your procedure. . Transportation: Public transportation is not allowed. Bring an adult driver. The driver must be physically present in our waiting room before any procedure can be started. Marland Kitchen Physical Assistance: Bring an adult physically capable of assisting you, in the event you need help. This adult should keep you company at home for at least 6 hours after the procedure. . Blood Pressure Medicine: Take your blood pressure medicine with a sip of water the morning of the procedure. . Blood thinners: Notify our staff if you are taking any blood thinners. Depending on which one you take, there will be  specific instructions on how and when to stop it. . Diabetics on insulin: Notify the staff so that you can be scheduled 1st case in the morning. If your diabetes requires high dose insulin, take only  of your normal insulin dose the morning of the procedure and notify the staff that you have done so. . Preventing infections: Shower with an antibacterial soap the morning of your procedure. . Build-up your immune system: Take 1000 mg of Vitamin C with every meal (3 times a day)  the day prior to your procedure. Marland Kitchen Antibiotics: Inform the staff if you have a condition or reason that requires you to take antibiotics before dental procedures. . Pregnancy: If you are pregnant, call and cancel the procedure. . Sickness: If you have a cold, fever, or any active infections, call and cancel the procedure. . Arrival: You must be in the facility at least 30 minutes prior to your scheduled procedure. . Children: Do not bring children with you. . Dress appropriately: Bring dark clothing that you would not mind if they get stained. . Valuables: Do not bring any jewelry or valuables.  Procedure appointments are reserved for interventional treatments only. Marland Kitchen No Prescription Refills. . No medication changes will be discussed during procedure appointments. . No disability issues will be discussed.  Reasons to call and reschedule or cancel your procedure: (Following these recommendations will minimize the risk of a serious complication.) . Surgeries: Avoid having procedures within 2 weeks of any surgery. (Avoid for 2 weeks before or after any surgery). . Flu Shots: Avoid having procedures within 2 weeks of a flu shots or . (Avoid for 2 weeks before or after immunizations). . Barium: Avoid having a procedure within 7-10 days after having had a radiological study involving the use of radiological contrast. (Myelograms, Barium swallow or enema study). . Heart attacks: Avoid any elective procedures or surgeries for the initial 6 months after a "Myocardial Infarction" (Heart Attack). . Blood thinners: It is imperative that you stop these medications before procedures. Let us know if you if you take any blood thinner.  . Infection: Avoid procedures during or within two weeks of an infection (including chest colds or gastrointestinal problems). Symptoms associated with infections include: Localized redness, fever, chills, night sweats or profuse sweating, burning sensation when voiding, cough,  congestion, stuffiness, runny nose, sore throat, diarrhea, nausea, vomiting, cold or Flu symptoms, recent or current infections. It is specially important if the infection is over the area that we intend to treat. Marland Kitchen Heart and lung problems: Symptoms that may suggest an active cardiopulmonary problem include: cough, chest pain, breathing difficulties or shortness of breath, dizziness, ankle swelling, uncontrolled high or unusually low blood pressure, and/or palpitations. If you are experiencing any of these symptoms, cancel your procedure and contact your primary care physician for an evaluation.  Remember:  Regular Business hours are:  Monday to Thursday 8:00 AM to 4:00 PM  Provider's Schedule: Milinda Pointer, MD:  Procedure days: Tuesday and Thursday 7:30 AM to 4:00 PM  Gillis Santa, MD:  Procedure days: Monday and Wednesday 7:30 AM to 4:00 PM ____________________________________________________________________________________________   Preparing for Procedure with Sedation Instructions: . Oral Intake: Do not eat or drink anything for at least 8 hours prior to your procedure. . Transportation: Public transportation is not allowed. Bring an adult driver. The driver must be physically present in our waiting room before any procedure can be started. Marland Kitchen Physical Assistance: Bring an adult capable of physically assisting you, in the event  you need help. . Blood Pressure Medicine: Take your blood pressure medicine with a sip of water the morning of the procedure. . Insulin: Take only  of your normal insulin dose. . Preventing infections: Shower with an antibacterial soap the morning of your procedure. . Build-up your immune system: Take 1000 mg of Vitamin C with every meal (3 times a day) the day prior to your procedure. . Pregnancy: If you are pregnant, call and cancel the procedure. . Sickness: If you have a cold, fever, or any active infections, call and cancel the procedure. . Arrival:  You must be in the facility at least 30 minutes prior to your scheduled procedure. . Children: Do not bring children with you. . Dress appropriately: Bring dark clothing that you would not mind if they get stained. . Valuables: Do not bring any jewelry or valuables. Procedure appointments are reserved for interventional treatments only. Marland Kitchen No Prescription Refills. . No medication changes will be discussed during procedure appointments. No disability issues will be discussed.Intercostal Nerve Block Patient Information  Description: The twelve intercostal nerves arise from the first thru twelfth thoracic nerve roots.  The nerve begins at the spine and wraps around the body, lying in a groove underneath each rib.  Each intercostal nerve innervates a specific strip of skin and body walk of the abdomen and chest.  Therefore, injuries of the chest wall or abdominal wall result in pain that is transmitted back to the brian via the intercostal nerves.  Examples of such injuries include rib fractures and incisions for lung and gall bladder surgery.  Occasionally, pain may persist long after an injury or surgical incision secondary to inflammation and irritation of the intercostal nerve.  The longstanding pain is known as intercostal neuralgia.  An intercostal nerve block is preformed to eliminate pain either temporarily or permanently.  A small needle is placed below the rib and local anesthetic (like Novocaine) and possibly steroid is injected.  Usually 2-4 intercostal nerves are blocked at a time depending on the problem.  The patient will experience a slight "pin-prick" sensation for each injection.  Shortly thereafter, the strip of skin that is innervated by the blocked intercostal nerve will feel numb.  Persistent pain that is only temporarily relieved with local anesthetic may require a more permanent block. This procedure is called Cryoneurolysis and entails placing a small probe beneath the rib to freeze  the nerve.  Conditions that may be treated by intercostal nerve blocks:  Rib fractures Longstanding pain from surgery of the chest or abdomen (intercostal neuralgia) Pain from chest tubes Pain from trauma to the chest  Preparation for the injections:  Do not eat any solid food or dairy products within 8 hours of your appointment. You may drink clear liquids up to 3 hours before appointment.  Clear liquids include water, black coffee, juice or soda.  No milk or cream please. You may take your regular medication, including pain medications, with a sip of water before your appointment.  Diabetics should hold regular insulin (if take separately) and take 1/2 normal NPH dose the morning of the procedure.   Carry some sugar containing items with you to your appointment. A driver must accompany you and be prepared to drive you home after your procedure. Bring all your current medications with you. An IV may be inserted and sedation may be given at the discretion of the physician. A blood pressure cuff, EKG and other monitors will often be applied during the procedure.  Some  patients may need to have extra oxygen administered for a short period. You will be asked to provide medical information, including your allergies, prior to the procedure.  We must know immediately if you are taking blood thinners (like Coumadin/Warfarin) or if you are allergic to IV iodine contrast (dye). We must know if you could possible be pregnant.  Possible side-effects:  Bleeding from needle site Infection (rare) Nerve injury (rare) Numbness & tingling of skin Collapsed lung requiring chest tube (rare) Local anesthetic toxicity (rare) Light-headedness (temporary) Pain at injection site (several days) Decreased blood pressure (temporary) Shortness of breath Jittery/shaking sensation (temporary)  Call if you experience:  Difficulty breathing or hives (go directly to the emergency room) Redness, inflammation or  drainage at the injection site Severe pain at the site of the injection Any new symptoms which are concerning   Please note:  Your pain may subside immediately but may return several hours after the injection.  Often, more than one injection is required to reduce the pain. Also, if several temporary blocks with local anesthetic are ineffective, a more permanent block with cryolysis may be necessary.  This will be discussed with you should this be the case.  If you have any questions, please call [phone] [facility]    .

## 2018-10-27 ENCOUNTER — Other Ambulatory Visit: Payer: Self-pay

## 2018-10-27 ENCOUNTER — Ambulatory Visit (HOSPITAL_BASED_OUTPATIENT_CLINIC_OR_DEPARTMENT_OTHER): Payer: Medicare HMO | Admitting: Pain Medicine

## 2018-10-27 ENCOUNTER — Ambulatory Visit
Admission: RE | Admit: 2018-10-27 | Discharge: 2018-10-27 | Disposition: A | Discharge status: Home / Self Care | Payer: Medicare HMO | Source: Ambulatory Visit | Attending: Pain Medicine | Admitting: Pain Medicine

## 2018-10-27 ENCOUNTER — Encounter: Payer: Self-pay | Admitting: Pain Medicine

## 2018-10-27 VITALS — BP 123/70 | HR 91 | Temp 97.4°F | Resp 18 | Ht 62.0 in | Wt 193.0 lb

## 2018-10-27 DIAGNOSIS — G588 Other specified mononeuropathies: Secondary | ICD-10-CM | POA: Insufficient documentation

## 2018-10-27 DIAGNOSIS — G8912 Acute post-thoracotomy pain: Secondary | ICD-10-CM

## 2018-10-27 DIAGNOSIS — R0782 Intercostal pain: Secondary | ICD-10-CM

## 2018-10-27 DIAGNOSIS — R0789 Other chest pain: Secondary | ICD-10-CM | POA: Insufficient documentation

## 2018-10-27 DIAGNOSIS — M940 Chondrocostal junction syndrome [Tietze]: Secondary | ICD-10-CM | POA: Insufficient documentation

## 2018-10-27 DIAGNOSIS — R0781 Pleurodynia: Secondary | ICD-10-CM

## 2018-10-27 DIAGNOSIS — L7682 Other postprocedural complications of skin and subcutaneous tissue: Secondary | ICD-10-CM | POA: Insufficient documentation

## 2018-10-27 DIAGNOSIS — G8918 Other acute postprocedural pain: Secondary | ICD-10-CM

## 2018-10-27 DIAGNOSIS — G8922 Chronic post-thoracotomy pain: Secondary | ICD-10-CM | POA: Diagnosis not present

## 2018-10-27 MED ORDER — LACTATED RINGERS IV SOLN
1000.0000 mL | Freq: Once | INTRAVENOUS | Status: AC
  Administered 2018-10-27: 1000 mL via INTRAVENOUS

## 2018-10-27 MED ORDER — FENTANYL CITRATE (PF) 100 MCG/2ML IJ SOLN
25.0000 ug | INTRAMUSCULAR | Status: DC | PRN
  Administered 2018-10-27: 100 ug via INTRAVENOUS
  Filled 2018-10-27: qty 2

## 2018-10-27 MED ORDER — DEXAMETHASONE SODIUM PHOSPHATE 10 MG/ML IJ SOLN
10.0000 mg | Freq: Once | INTRAMUSCULAR | Status: AC
  Administered 2018-10-27: 10 mg
  Filled 2018-10-27: qty 1

## 2018-10-27 MED ORDER — DIPHENHYDRAMINE HCL 50 MG/ML IJ SOLN
12.5000 mg | INTRAMUSCULAR | Status: DC | PRN
  Administered 2018-10-27: 37.5 mg via INTRAVENOUS
  Filled 2018-10-27: qty 1

## 2018-10-27 MED ORDER — TRIAMCINOLONE ACETONIDE 40 MG/ML IJ SUSP
40.0000 mg | Freq: Once | INTRAMUSCULAR | Status: AC
  Administered 2018-10-27: 40 mg
  Filled 2018-10-27: qty 1

## 2018-10-27 MED ORDER — METHYLPREDNISOLONE ACETATE 80 MG/ML IJ SUSP
80.0000 mg | Freq: Once | INTRAMUSCULAR | Status: AC
  Administered 2018-10-27: 80 mg via INTRA_ARTICULAR
  Filled 2018-10-27: qty 1

## 2018-10-27 MED ORDER — MIDAZOLAM HCL 5 MG/5ML IJ SOLN
1.0000 mg | INTRAMUSCULAR | Status: DC | PRN
  Administered 2018-10-27: 5 mg via INTRAVENOUS
  Filled 2018-10-27: qty 5

## 2018-10-27 MED ORDER — ROPIVACAINE HCL 2 MG/ML IJ SOLN
9.0000 mL | Freq: Once | INTRAMUSCULAR | Status: AC
  Administered 2018-10-27: 9 mL via INTRA_ARTICULAR
  Filled 2018-10-27: qty 10

## 2018-10-27 MED ORDER — ROPIVACAINE HCL 2 MG/ML IJ SOLN
9.0000 mL | Freq: Once | INTRAMUSCULAR | Status: AC
  Administered 2018-10-27: 9 mL via PERINEURAL
  Filled 2018-10-27: qty 10

## 2018-10-27 MED ORDER — LIDOCAINE HCL 2 % IJ SOLN
20.0000 mL | Freq: Once | INTRAMUSCULAR | Status: AC
  Administered 2018-10-27: 400 mg
  Filled 2018-10-27: qty 40

## 2018-10-27 NOTE — Progress Notes (Signed)
Safety precautions to be maintained throughout the outpatient stay will include: orient to surroundings, keep bed in low position, maintain call bell within reach at all times, provide assistance with transfer out of bed and ambulation.  

## 2018-10-27 NOTE — Patient Instructions (Signed)

## 2018-10-27 NOTE — Progress Notes (Signed)
Patient's Name: Susan Houston  MRN: [id number]  Referring Provider: Milinda Pointer, MD  DOB: 1961-08-09  PCP: Valerie Roys, DO  DOS: 10/27/2018  Note by: Gaspar Cola, MD  Service setting: Ambulatory outpatient  Specialty: Interventional Pain Management  Patient type: Established  Location: [facility] (AMB) Pain Management Facility  Visit type: Interventional Procedure   Primary Reason for Visit: Interventional Pain Management Treatment. CC: Procedure (right sided intercostal nerve block )  Procedure #1:  Anesthesia, Analgesia, Anxiolysis:  Type: Diagnostic    Intercostal  Nerve Block  #3  Region: Posterolateral Thoracic Area Level: T5, T6, T7 Ribs Laterality: Right-Sided  Type: Moderate (Conscious) Sedation combined with Local Anesthesia Indication(s): Analgesia and Anxiety Route: Intravenous (IV) IV Access: Secured Sedation: Meaningful verbal contact was maintained at all times during the procedure  Local Anesthetic: Lidocaine 1-2%  Position: Lateral decubitus, tilted anteriorly (approx. 45 debrees), w/ the painful side up.   Indications: 1. Rib pain (Primary Area of Pain) (Right)   2. Post-thoracotomy pain syndrome   3. Chronic chest wall pain following surgery (Right)   4. Incisional pain (Secondary Area of Pain)   5. Intercostal pain (Right)   6. Intercostal neuralgia    Procedure #2:  Anesthesia, Analgesia, Anxiolysis:  Type: Costochondral injection.  #1  Purpose: Diagnostic Target Area: Costochondral junction Region: Anterior sternal region Approach: Percutanous Level: Lower third of the sternum Laterality: Right-side  Type: Moderate (Conscious) Sedation combined with Local Anesthesia Indication(s): Analgesia and Anxiety Local Anesthetic: Lidocaine 1-2% Route: Intravenous (IV) IV Access: Secured Sedation: Meaningful verbal contact was maintained at all times during the procedure   Position: Supine   Indications: 1. Costochondritis    Pain  Score: Pre-procedure: 6 /10 Post-procedure: 0-No pain/10  Pre-op Assessment:  Susan Houston is a 58 y.o. (year old), female patient, seen today for interventional treatment. She  has a past surgical history that includes Bladder suspension (2014); Tonsillectomy; Excision neuroma (Bilateral, 09/24/2014); Esophagogastroduodenoscopy (2015); Colonoscopy; Esophagogastroduodenoscopy (egd) with propofol (N/A, 11/20/2016); Joint replacement (2009/2010); Abdominal hysterectomy (1983); Eye surgery (Left, 05/11/2018); Cholecystectomy (1998); Cataract extraction w/PHACO (Left, 05/11/2018); Thoracotomy/lobectomy (Right, 05/16/2018); Video bronchoscopy (N/A, 05/16/2018); and Cataract extraction w/PHACO (Right, 08/11/2018). Susan Houston has a current medication list which includes the following prescription(s): albuterol, aspirin, clonazepam, gabapentin, quetiapine, sulfamethoxazole-trimethoprim, tizanidine, tramadol, and trazodone, and the following Facility-Administered Medications: diphenhydramine, fentanyl, ipratropium-albuterol, and midazolam. Her primarily concern today is the Procedure (right sided intercostal nerve block )  Initial Vital Signs:  Pulse/HCG Rate: 91ECG Heart Rate: 83 Temp: 98.1 F (36.7 C) Resp: 18 BP: (!) 144/71 SpO2: 97 %  BMI: Estimated body mass index is 35.3 kg/m as calculated from the following:   Height as of this encounter: 5\' 2"  (1.575 m).   Weight as of this encounter: 193 lb (87.5 kg).  Risk Assessment: Allergies: Reviewed. She is allergic to morphine and related; cymbalta [duloxetine hcl]; adhesive [tape]; and lexapro [escitalopram oxalate].  Allergy Precautions: None required Coagulopathies: Reviewed. None identified.  Blood-thinner therapy: None at this time Active Infection(s): Reviewed. None identified. Susan Houston is afebrile  Site Confirmation: Susan Houston was asked to confirm the procedure and laterality before marking the site Procedure checklist: Completed Consent: Before the  procedure and under the influence of no sedative(s), amnesic(s), or anxiolytics, the patient was informed of the treatment options, risks and possible complications. To fulfill our ethical and legal obligations, as recommended by the American Medical Association's Code of Ethics, I have informed the patient of my clinical impression; the nature and purpose  of the treatment or procedure; the risks, benefits, and possible complications of the intervention; the alternatives, including doing nothing; the risk(s) and benefit(s) of the alternative treatment(s) or procedure(s); and the risk(s) and benefit(s) of doing nothing. The patient was provided information about the general risks and possible complications associated with the procedure. These may include, but are not limited to: failure to achieve desired goals, infection, bleeding, organ or nerve damage, allergic reactions, paralysis, and death. In addition, the patient was informed of those risks and complications associated to the procedure, such as failure to decrease pain; infection; bleeding; organ or nerve damage with subsequent damage to sensory, motor, and/or autonomic systems, resulting in permanent pain, numbness, and/or weakness of one or several areas of the body; allergic reactions; (i.e.: anaphylactic reaction); and/or death. Furthermore, the patient was informed of those risks and complications associated with the medications. These include, but are not limited to: allergic reactions (i.e.: anaphylactic or anaphylactoid reaction(s)); adrenal axis suppression; blood sugar elevation that in diabetics may result in ketoacidosis or comma; water retention that in patients with history of congestive heart failure may result in shortness of breath, pulmonary edema, and decompensation with resultant heart failure; weight gain; swelling or edema; medication-induced neural toxicity; particulate matter embolism and blood vessel occlusion with resultant organ,  and/or nervous system infarction; and/or aseptic necrosis of one or more joints. Finally, the patient was informed that Medicine is not an exact science; therefore, there is also the possibility of unforeseen or unpredictable risks and/or possible complications that may result in a catastrophic outcome. The patient indicated having understood very clearly. We have given the patient no guarantees and we have made no promises. Enough time was given to the patient to ask questions, all of which were answered to the patient's satisfaction. Ms. Reaves has indicated that she wanted to continue with the procedure. Attestation: I, the ordering provider, attest that I have discussed with the patient the benefits, risks, side-effects, alternatives, likelihood of achieving goals, and potential problems during recovery for the procedure that I have provided informed consent. Date  Time: 10/27/2018  1:21 PM  Pre-Procedure Preparation:  Monitoring: As per clinic protocol. Respiration, ETCO2, SpO2, BP, heart rate and rhythm monitor placed and checked for adequate function Safety Precautions: Patient was assessed for positional comfort and pressure points before starting the procedure. Time-out: I initiated and conducted the "Time-out" before starting the procedure, as per protocol. The patient was asked to participate by confirming the accuracy of the "Time Out" information. Verification of the correct person, site, and procedure were performed and confirmed by me, the nursing staff, and the patient. "Time-out" conducted as per Joint Commission's Universal Protocol (UP.01.01.01). Time: 1416  Description of Procedure #1:  Target Area: The sub-costal neurovascular bundle area Approach: Sub-costal approach Area Prepped: Entire Postero-lateral Thoracic Region Prepping solution: ChloraPrep (2% chlorhexidine gluconate and 70% isopropyl alcohol) Safety Precautions: Aspiration looking for blood return was conducted prior to  all injections. At no point did we inject any substances, as a needle was being advanced. No attempts were made at seeking any paresthesias. Safe injection practices and needle disposal techniques used. Medications properly checked for expiration dates. SDV (single dose vial) medications used. Description of the Procedure: Protocol guidelines were followed. The patient was placed in position over the procedure table. The target area was identified and the area prepped in the usual manner. Skin & deeper tissues infiltrated with local anesthetic. After cleaning the skin with an antiseptic solution, 1-2 mL of dilute local  anesthetic was infiltrated subcutaneously at the planned injection site. The fingers of the palpating hand were used to straddle the insertion site at the inferior border of the rib and fix the skin to avoid unwanted skin movement. Appropriate amount of time allowed to pass for local anesthetics to take effect. The procedure needles were then advanced to the target area at an angle of approximately 20 cephalad to the skin. Contact with the rib was made. While maintaining the same angle of insertion, the needle was walked off the inferior border of the rib as the skin was allowed to return to its initial position. Then the needle was advanced no more than 3 mm below the inferior margin of the rib. Proper needle placement was secured. Negative aspiration confirmed. Following negative aspiration for blood or air, 3-5 mL of local anesthetic was injected. The solution injected in intermittent fashion, asking for systemic symptoms every 0.5cc of injectate. The needle was then removed and the area cleansed, making sure to leave some of the prepping solution back to take advantage of its long term bactericidal properties. Vitals:   10/27/18 1443 10/27/18 1449 10/27/18 1453 10/27/18 1503  BP: 111/62  119/67 123/70  Pulse:      Resp: 16  20 18   Temp:  (!) 97.4 F (36.3 C)    SpO2: 100%  94% 94%   Weight:      Height:        Start Time: 1416 hrs. End Time: 1442 hrs.   Materials:  Needle(s) Type: Spinal Needle Gauge: 25G Length: 1.5-in Medication(s): Please see orders for medications and dosing details.  Imaging Guidance (Non-Spinal) for procedure #1:  Type of Imaging Technique: Fluoroscopy Guidance (Non-Spinal) Indication(s): Assistance in needle guidance and placement for procedures requiring needle placement in or near specific anatomical locations not easily accessible without such assistance. Exposure Time: Please see nurses notes. Contrast: Before injecting any contrast, we confirmed that the patient did not have an allergy to iodine, shellfish, or radiological contrast. Once satisfactory needle placement was completed at the desired level, radiological contrast was injected. Contrast injected under live fluoroscopy. No contrast complications. See chart for type and volume of contrast used. Fluoroscopic Guidance: I was personally present during the use of fluoroscopy. "Tunnel Vision Technique" used to obtain the best possible view of the target area. Parallax error corrected before commencing the procedure. "Direction-depth-direction" technique used to introduce the needle under continuous pulsed fluoroscopy. Once target was reached, antero-posterior, oblique, and lateral fluoroscopic projection used confirm needle placement in all planes. Images permanently stored in EMR. Interpretation: I personally interpreted the imaging intraoperatively. Adequate needle placement confirmed in multiple planes. Appropriate spread of contrast into desired area was observed. No evidence of afferent or efferent intravascular uptake. Permanent images saved into the patient's record.  Description of Procedure #2:  Area Prepped: Entire chest area and upper abdominal area Prepping solution: ChloraPrep (2% chlorhexidine gluconate and 70% isopropyl alcohol) Safety Precautions: Aspiration looking for  blood return was conducted prior to all injections. At no point did we inject any substances, as a needle was being advanced. No attempts were made at seeking any paresthesias. Safe injection practices and needle disposal techniques used. Medications properly checked for expiration dates. SDV (single dose vial) medications used. Description of the Procedure: Protocol guidelines were followed. The patient was placed in position. The target area was identified and prepped in the usual manner. Skin & deeper tissues infiltrated with local anesthetic. Appropriate time provided for local anesthetics to take effect.  The procedure needle was slowly advanced to target area. Proper needle placement secured. Negative aspiration confirmed. Solution injected in intermittent fashion, asking for systemic symptoms every 0.5cc. Needle(s) removed and area cleaned, making sure to leave some prepping solution back to take advantage of its long term bactericidal properties.  Vitals:   10/27/18 1443 10/27/18 1449 10/27/18 1453 10/27/18 1503  BP: 111/62  119/67 123/70  Pulse:      Resp: 16  20 18   Temp:  (!) 97.4 F (36.3 C)    SpO2: 100%  94% 94%  Weight:      Height:        Start Time: 1416 hrs. End Time: 1442 hrs. Materials:  Needle(s) Type: Epidural needle Gauge: 25G Length: 1.5-in Medication(s): Please see orders for medications and dosing details.  Imaging Guidance for procedure #2:  Type of Imaging Technique: None used Indication(s): N/A Exposure Time: No patient exposure Contrast: None used. Fluoroscopic Guidance: N/A Ultrasound Guidance: N/A Interpretation: N/A  Antibiotic Prophylaxis:   Anti-infectives (From admission, onward)   None     Indication(s): None identified  Post-operative Assessment:  Post-procedure Vital Signs:  Pulse/HCG Rate: 9183 Temp: (!) 97.4 F (36.3 C) Resp: 18 BP: 123/70 SpO2: 94 %  EBL: None  Complications: No immediate post-treatment complications observed  by team, or reported by patient.  Note: The patient tolerated the entire procedure well. A repeat set of vitals were taken after the procedure and the patient was kept under observation following institutional policy, for this type of procedure. Post-procedural neurological assessment was performed, showing return to baseline, prior to discharge. The patient was provided with post-procedure discharge instructions, including a section on how to identify potential problems. Should any problems arise concerning this procedure, the patient was given instructions to immediately contact us, at any time, without hesitation. In any case, we plan to contact the patient by telephone for a follow-up status report regarding this interventional procedure.  Comments:  No additional relevant information.  Plan of Care   Imaging Orders     DG C-Arm 1-60 Min-No Report  Procedure Orders     INTERCOSTAL NERVE BLOCK     Injection tendon or ligament  Medications ordered for procedure: Meds ordered this encounter  Medications  . lidocaine (XYLOCAINE) 2 % (with pres) injection 400 mg  . midazolam (VERSED) 5 MG/5ML injection 1-2 mg    Make sure Flumazenil is available in the pyxis when using this medication. If oversedation occurs, administer 0.2 mg IV over 15 sec. If after 45 sec no response, administer 0.2 mg again over 1 min; may repeat at 1 min intervals; not to exceed 4 doses (1 mg)  . fentaNYL (SUBLIMAZE) injection 25-50 mcg    Make sure Narcan is available in the pyxis when using this medication. In the event of respiratory depression (RR< 8/min): Titrate NARCAN (naloxone) in increments of 0.1 to 0.2 mg IV at 2-3 minute intervals, until desired degree of reversal.  . lactated ringers infusion 1,000 mL  . diphenhydrAMINE (BENADRYL) injection 12.5 mg  . ropivacaine (PF) 2 mg/mL (0.2%) (NAROPIN) injection 9 mL  . dexamethasone (DECADRON) injection 10 mg  . triamcinolone acetonide (KENALOG-40) injection 40 mg   . methylPREDNISolone acetate (DEPO-MEDROL) injection 80 mg  . ropivacaine (PF) 2 mg/mL (0.2%) (NAROPIN) injection 9 mL   Medications administered: We administered lidocaine, midazolam, fentaNYL, lactated ringers, diphenhydrAMINE, ropivacaine (PF) 2 mg/mL (0.2%), dexamethasone, triamcinolone acetonide, methylPREDNISolone acetate, and ropivacaine (PF) 2 mg/mL (0.2%).  See the medical record for  exact dosing, route, and time of administration.  Disposition: Discharge home  Discharge Date & Time: 10/27/2018;   hrs.   Physician-requested Follow-up: No follow-ups on file.  Future Appointments  Date Time Provider [place]  11/03/2018  8:45 AM Ursula Alert, MD [facility] None  11/04/2018 10:00 AM [facility]  11/04/2018  3:30 PM Valerie Roys, DO [facility]  11/07/2018  1:30 PM [facility] None  11/07/2018  2:00 PM Cammie Sickle, MD [facility] None  11/14/2018  9:15 AM Milinda Pointer, MD [facility] None   Primary Care Physician: Valerie Roys, DO Location: [facility] Note by: Gaspar Cola, MD Date: 10/27/2018; Time: 3:32 PM  Disclaimer:  Medicine is not an Chief Strategy Officer. The only guarantee in medicine is that nothing is guaranteed. It is important to note that the decision to proceed with this intervention was based on the information collected from the patient. The Data and conclusions were drawn from the patient's questionnaire, the interview, and the physical examination. Because the information was provided in large part by the patient, it cannot be guaranteed that it has not been purposely or unconsciously manipulated. Every effort has been made to obtain as much relevant data as possible for this evaluation. It is important to note that the conclusions that lead to this procedure are derived in large part from the available data. Always take into account that the treatment will also be dependent on  availability of resources and existing treatment guidelines, considered by other Pain Management Practitioners as being common knowledge and practice, at the time of the intervention. For Medico-Legal purposes, it is also important to point out that variation in procedural techniques and pharmacological choices are the acceptable norm. The indications, contraindications, technique, and results of the above procedure should only be interpreted and judged by a Board-Certified Interventional Pain Specialist with extensive familiarity and expertise in the same exact procedure and technique.

## 2018-10-28 ENCOUNTER — Emergency Department: Payer: Medicare HMO

## 2018-10-28 ENCOUNTER — Other Ambulatory Visit: Payer: Self-pay

## 2018-10-28 ENCOUNTER — Emergency Department
Admission: EM | Admit: 2018-10-28 | Discharge: 2018-10-28 | Disposition: A | Discharge status: Home / Self Care | Payer: Medicare HMO | Attending: Emergency Medicine | Admitting: Emergency Medicine

## 2018-10-28 ENCOUNTER — Telehealth: Payer: Self-pay

## 2018-10-28 ENCOUNTER — Encounter: Payer: Self-pay | Admitting: Emergency Medicine

## 2018-10-28 DIAGNOSIS — N939 Abnormal uterine and vaginal bleeding, unspecified: Secondary | ICD-10-CM | POA: Diagnosis not present

## 2018-10-28 DIAGNOSIS — C3490 Malignant neoplasm of unspecified part of unspecified bronchus or lung: Secondary | ICD-10-CM | POA: Diagnosis not present

## 2018-10-28 DIAGNOSIS — J449 Chronic obstructive pulmonary disease, unspecified: Secondary | ICD-10-CM | POA: Diagnosis not present

## 2018-10-28 DIAGNOSIS — Z87891 Personal history of nicotine dependence: Secondary | ICD-10-CM | POA: Diagnosis not present

## 2018-10-28 DIAGNOSIS — F419 Anxiety disorder, unspecified: Secondary | ICD-10-CM | POA: Diagnosis not present

## 2018-10-28 DIAGNOSIS — Z7982 Long term (current) use of aspirin: Secondary | ICD-10-CM | POA: Diagnosis not present

## 2018-10-28 DIAGNOSIS — F329 Major depressive disorder, single episode, unspecified: Secondary | ICD-10-CM | POA: Diagnosis not present

## 2018-10-28 DIAGNOSIS — I10 Essential (primary) hypertension: Secondary | ICD-10-CM | POA: Diagnosis not present

## 2018-10-28 DIAGNOSIS — Z85118 Personal history of other malignant neoplasm of bronchus and lung: Secondary | ICD-10-CM | POA: Diagnosis not present

## 2018-10-28 DIAGNOSIS — R0989 Other specified symptoms and signs involving the circulatory and respiratory systems: Secondary | ICD-10-CM | POA: Insufficient documentation

## 2018-10-28 DIAGNOSIS — Z79899 Other long term (current) drug therapy: Secondary | ICD-10-CM | POA: Insufficient documentation

## 2018-10-28 DIAGNOSIS — Z9049 Acquired absence of other specified parts of digestive tract: Secondary | ICD-10-CM | POA: Insufficient documentation

## 2018-10-28 DIAGNOSIS — R1030 Lower abdominal pain, unspecified: Secondary | ICD-10-CM | POA: Diagnosis not present

## 2018-10-28 DIAGNOSIS — Z96659 Presence of unspecified artificial knee joint: Secondary | ICD-10-CM | POA: Diagnosis not present

## 2018-10-28 DIAGNOSIS — Z96653 Presence of artificial knee joint, bilateral: Secondary | ICD-10-CM | POA: Diagnosis not present

## 2018-10-28 DIAGNOSIS — J45909 Unspecified asthma, uncomplicated: Secondary | ICD-10-CM | POA: Insufficient documentation

## 2018-10-28 DIAGNOSIS — J432 Centrilobular emphysema: Secondary | ICD-10-CM | POA: Diagnosis not present

## 2018-10-28 DIAGNOSIS — Z8521 Personal history of malignant neoplasm of larynx: Secondary | ICD-10-CM | POA: Diagnosis not present

## 2018-10-28 LAB — COMPREHENSIVE METABOLIC PANEL
ALT: 12 U/L (ref 0–44)
AST: 18 U/L (ref 15–41)
Albumin: 3.9 g/dL (ref 3.5–5.0)
Alkaline Phosphatase: 74 U/L (ref 38–126)
Anion gap: 8 (ref 5–15)
BUN: 14 mg/dL (ref 6–20)
CHLORIDE: 102 mmol/L (ref 98–111)
CO2: 24 mmol/L (ref 22–32)
Calcium: 8.8 mg/dL — ABNORMAL LOW (ref 8.9–10.3)
Creatinine, Ser: 0.76 mg/dL (ref 0.44–1.00)
GFR calc Af Amer: >60 mL/min (ref >60)
GFR calc non Af Amer: >60 mL/min (ref >60)
Glucose, Bld: 135 mg/dL — ABNORMAL HIGH (ref 70–99)
Potassium: 4.7 mmol/L (ref 3.5–5.1)
Sodium: 134 mmol/L — ABNORMAL LOW (ref 135–145)
Total Bilirubin: 0.5 mg/dL (ref 0.3–1.2)
Total Protein: 7.3 g/dL (ref 6.5–8.1)

## 2018-10-28 LAB — LIPASE, BLOOD: LIPASE: 24 U/L (ref 11–51)

## 2018-10-28 LAB — URINALYSIS, COMPLETE (UACMP) WITH MICROSCOPIC
Bilirubin Urine: NEGATIVE
Glucose, UA: NEGATIVE mg/dL
KETONES UR: NEGATIVE mg/dL
Leukocytes, UA: NEGATIVE
NITRITE: NEGATIVE
Protein, ur: NEGATIVE mg/dL
Specific Gravity, Urine: 1.015 (ref 1.005–1.030)
pH: 8 (ref 5.0–8.0)

## 2018-10-28 LAB — CBC
HCT: 33.9 % — ABNORMAL LOW (ref 36.0–46.0)
HEMOGLOBIN: 10.7 g/dL — AB (ref 12.0–15.0)
MCH: 27.1 pg (ref 26.0–34.0)
MCHC: 31.6 g/dL (ref 30.0–36.0)
MCV: 85.8 fL (ref 80.0–100.0)
Platelets: 300 10*3/uL (ref 150–400)
RBC: 3.95 MIL/uL (ref 3.87–5.11)
RDW: 13.2 % (ref 11.5–15.5)
WBC: 12.6 10*3/uL — AB (ref 4.0–10.5)
nRBC: 0 % (ref 0.0–0.2)

## 2018-10-28 MED ORDER — ONDANSETRON HCL 4 MG/2ML IJ SOLN
4.0000 mg | Freq: Once | INTRAMUSCULAR | Status: AC
  Administered 2018-10-28: 4 mg via INTRAVENOUS
  Filled 2018-10-28: qty 2

## 2018-10-28 MED ORDER — SODIUM CHLORIDE 0.9 % IV BOLUS
1000.0000 mL | Freq: Once | INTRAVENOUS | Status: AC
  Administered 2018-10-28: 1000 mL via INTRAVENOUS

## 2018-10-28 MED ORDER — ONDANSETRON 4 MG PO TBDP
4.0000 mg | ORAL_TABLET | Freq: Once | ORAL | Status: AC | PRN
  Administered 2018-10-28: 4 mg via ORAL
  Filled 2018-10-28: qty 1

## 2018-10-28 MED ORDER — IOPAMIDOL (ISOVUE-300) INJECTION 61%
100.0000 mL | Freq: Once | INTRAVENOUS | Status: AC | PRN
  Administered 2018-10-28: 100 mL via INTRAVENOUS

## 2018-10-28 MED ORDER — OXYCODONE-ACETAMINOPHEN 5-325 MG PO TABS: 1.0000 | ORAL_TABLET | ORAL | 0 refills | Status: DC | PRN

## 2018-10-28 MED ORDER — ACETAMINOPHEN 325 MG PO TABS
650.0000 mg | ORAL_TABLET | Freq: Once | ORAL | Status: AC
  Administered 2018-10-28: 650 mg via ORAL
  Filled 2018-10-28: qty 2

## 2018-10-28 MED ORDER — HYDROMORPHONE HCL 1 MG/ML IJ SOLN
1.0000 mg | Freq: Once | INTRAMUSCULAR | Status: AC
  Administered 2018-10-28: 1 mg via INTRAVENOUS
  Filled 2018-10-28: qty 1

## 2018-10-28 MED ORDER — FENTANYL CITRATE (PF) 100 MCG/2ML IJ SOLN
50.0000 ug | Freq: Once | INTRAMUSCULAR | Status: AC
  Administered 2018-10-28: 50 ug via INTRAVENOUS
  Filled 2018-10-28: qty 2

## 2018-10-28 MED ORDER — IOPAMIDOL (ISOVUE-300) INJECTION 61%
30.0000 mL | Freq: Once | INTRAVENOUS | Status: AC
  Administered 2018-10-28: 30 mL via ORAL

## 2018-10-28 NOTE — ED Triage Notes (Signed)
Pt reports has been having vaginal bleeding for the past 4 days reports every time she uses tissue blood in on it, denies any blood in urine that she can see, reports lower abdominal pain, reports 7 months ago had similar symptoms and had a CT and found out she had rt lower lobe CA, pt concerned CA is back Pt reports N/V today reports 5 episodes since this morning

## 2018-10-28 NOTE — Telephone Encounter (Signed)
Post procedure phone call. Patient states she is doing good.  

## 2018-10-28 NOTE — ED Notes (Signed)
ED Provider at bedside. 

## 2018-10-28 NOTE — ED Provider Notes (Signed)
CT is negative.  Stable for discharge.   Darel Hong, MD 10/28/18 [phone]

## 2018-10-28 NOTE — ED Provider Notes (Signed)
[facility] Emergency Department Provider Note  Time seen: 10:21 PM  I have reviewed the triage vital signs and the nursing notes.   HISTORY  Chief Complaint Vaginal Bleeding; Nausea; and Emesis    HPI Susan Houston is a 58 y.o. female with a past medical history of anemia, anxiety, COPD, depression, hypertension, hyperlipidemia, lung cancer status post right lower lobectomy, presents to the emergency department for lower abdominal pain.  According to the patient for the past several days she has been experiencing a mild dull aching pain across her lower abdomen however today became moderate to severe 8/10 dull aching pain currently.  Also states today she has noticed blood on the toilet paper when wiping after urinating.  Denies any blood in her urine itself.  Denies any black or bloody stool.  Patient is status post hysterectomy per patient.   Past Medical History:  Diagnosis Date  . Anemia    vitamin b12 deficiency. no longer taking supplements  . Anxiety   . Arthritis   . Asthma   . Back pain   . Bruises easily   . Cancer ([place]) 2009   LARYNX CANCER - CHEMO / RADIATION (NO SURGERY)   . Chronic leg pain    BILATERAL  . COPD (chronic obstructive pulmonary disease) (HCC)    no inhalers for over 1 year  . Depression   . Difficulty sleeping   . Diverticulitis   . Dysrhythmia 04/2018   brady episodes. cleared by dr. Clayborn Bigness  . Facet syndrome, lumbar 03/28/2015  . Hemorrhoids   . History of kidney stones 04/2018   recently passed stone  . Hyperlipidemia   . Hypertension 2019   not on any treatment currently  . Incontinence of urine   . Laryngeal cancer ([place]) 2009  . Lung cancer ([place]) 04/2018   diagnosed via ct scan  . Migraine   . Migraine without aura and without status migrainosus, not intractable 09/11/2016  . Mild cognitive impairment 11/23/2017  . MRSA infection greater than 3 months ago    2008 right side of face  . Nerve damage    right leg.  thinks this is from her TKR  . Nodule of lower lobe of right lung 04/22/2018  . Oxygen deficiency    2L/HS  . Wears dentures    full upper and lower    Patient Active Problem List   Diagnosis Date Noted  . Costochondritis 10/27/2018  . Primary insomnia 09/16/2018  . Chronic chest wall pain following surgery (Right) 09/08/2018  . Intercostal pain (Right) 09/08/2018  . Intercostal neuralgia 09/08/2018  . 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 Chronic post-thoracotomy pain 08/29/2018  . Neurogenic pain 08/29/2018  . Vitamin D insufficiency 08/29/2018  . Rib pain (Primary Area of Pain) (Right) 08/18/2018  . Incisional pain (Secondary Area of Pain) 08/18/2018  . Chronic pain syndrome 08/18/2018  . Long term current use of opiate analgesic 08/18/2018  . Pharmacologic therapy 08/18/2018  . Disorder of skeletal system 08/18/2018  . Problems influencing health status 08/18/2018  . Controlled substance agreement signed 06/28/2018  . Primary cancer of right lower lobe of lung ([place]) 05/27/2018  . Lung cancer ([place]) 05/16/2018  . Laryngeal cancer ([place]) 04/22/2018  . Mass of lower lobe of right lung 04/22/2018  . Vitamin B12 deficiency 12/15/2017  . Frequent falls 11/23/2017  . Mild cognitive impairment 11/23/2017  . Migraine without aura and without status migrainosus, not intractable 09/11/2016  . Hyperlipidemia   . COPD (  chronic obstructive pulmonary disease) ([place])   . Severe depression ([place]) 12/24/2015  . Chronic anxiety 12/24/2015  . Coarse tremors 12/24/2015  . Lumbar facet syndrome 03/28/2015  . DDD (degenerative disc disease), lumbar 03/05/2015  . DDD (degenerative disc disease), lumbosacral 02/23/2015  . Sacroiliac joint dysfunction 02/23/2015  . Saphenous neuralgia 09/24/2014    Past Surgical History:  Procedure Laterality Date  . ABDOMINAL HYSTERECTOMY  1983  . BLADDER SUSPENSION  2014  . CATARACT EXTRACTION W/PHACO Left 05/11/2018   Procedure: CATARACT EXTRACTION  PHACO AND INTRAOCULAR LENS PLACEMENT ([place]) LEFT;  Surgeon: Leandrew Koyanagi, MD;  Location: [place];  Service: Ophthalmology;  Laterality: Left;  PREFERS EARLY  . CATARACT EXTRACTION W/PHACO Right 08/11/2018   Procedure: CATARACT EXTRACTION PHACO AND INTRAOCULAR LENS PLACEMENT ([facility]);  Surgeon: Leandrew Koyanagi, MD;  Location: [facility];  Service: Ophthalmology;  Laterality: Right;  CDE 5:02 Total Time 01:35 Fluid lot # [id number]  . CHOLECYSTECTOMY  1998  . COLONOSCOPY    . ESOPHAGOGASTRODUODENOSCOPY  2015  . ESOPHAGOGASTRODUODENOSCOPY (EGD) WITH PROPOFOL N/A 11/20/2016   Procedure: ESOPHAGOGASTRODUODENOSCOPY (EGD) WITH PROPOFOL;  Surgeon: Jonathon Bellows, MD;  Location: [facility];  Service: Endoscopy;  Laterality: N/A;  . EXCISION NEUROMA Bilateral 09/24/2014   Procedure: BILATERAL OPEN SAPHENOUS NEURECTOMIES AND OPEN LEFT PERIPATELLA OSETOPHYTECTOMY;  Surgeon: Mauri Pole, MD;  Location: [facility];  Service: Orthopedics;  Laterality: Bilateral;  . EYE SURGERY Left 05/11/2018   cataract extraction  . JOINT REPLACEMENT  2009/2010   BIL TOTAL KNEES  . THORACOTOMY/LOBECTOMY Right 05/16/2018   Procedure: THORACOTOMY/POSSIBLE LOBECTOMY;  Surgeon: Nestor Lewandowsky, MD;  Location: [facility];  Service: Thoracic;  Laterality: Right;  . TONSILLECTOMY    . VIDEO BRONCHOSCOPY N/A 05/16/2018   Procedure: PREOP  BRONCHOSCOPY;  Surgeon: Nestor Lewandowsky, MD;  Location: [facility];  Service: Thoracic;  Laterality: N/A;    Prior to Admission medications   Medication Sig Start Date End Date Taking? Authorizing Provider  albuterol (PROVENTIL HFA;VENTOLIN HFA) 108 (90 Base) MCG/ACT inhaler Inhale 2 puffs into the lungs every 4 (four) hours as needed for wheezing or shortness of breath. 07/01/18   Flora Lipps, MD  aspirin 81 MG tablet Take 81 mg by mouth daily.    [provider]  clonazePAM (KLONOPIN) 1 MG tablet Take 1 tablet (1 mg total) by mouth at bedtime. 10/25/18   Johnson, Megan P, DO   gabapentin (NEURONTIN) 300 MG capsule Take 1-3 capsules (300-900 mg total) by mouth 4 (four) times daily. Follow the written titration instructions. Patient taking differently: Take 600 mg by mouth at bedtime. Follow the written titration instructions. 08/29/18 11/27/18  Milinda Pointer, MD  QUEtiapine (SEROQUEL) 100 MG tablet Take 2 tablets (200 mg total) by mouth at bedtime. 10/04/18   Johnson, Megan P, DO  sulfamethoxazole-trimethoprim (BACTRIM) 400-80 MG tablet Take 1 tablet by mouth 2 (two) times daily. 10/25/18   Johnson, Megan P, DO  tiZANidine (ZANAFLEX) 4 MG tablet Take 2 mg by mouth 3 (three) times daily.    [provider]  traMADol (ULTRAM) 50 MG tablet Take 1 tablet (50 mg total) by mouth at bedtime as needed. 10/20/18   Volney American, PA-C  traZODone (DESYREL) 50 MG tablet Take 0.5-1 tablets (25-50 mg total) by mouth at bedtime as needed for sleep. 09/12/18   Park Liter P, DO    Allergies  Allergen Reactions  . Morphine And Related Itching  . Cymbalta [Duloxetine Hcl] Other (See Comments)    Pt states it keeps her  awake  . Adhesive [Tape] Other (See Comments)    Plastic tape rips skin and bruises her. PLEASE USE PAPER TAPE  . Lexapro [Escitalopram Oxalate] Other (See Comments)    Keeps awake for days.     Family History  Problem Relation Age of Onset  . Asthma Mother   . Diabetes Mother   . Hyperlipidemia Mother   . Hypertension Mother   . Cirrhosis Mother        Non-alcoholic  . Arthritis Father   . Cancer Father        Bone  . Hyperlipidemia Father   . Hypertension Father   . Anxiety disorder Sister   . Depression Sister   . Kidney Stones Son   . Stroke Maternal Grandmother   . Breast cancer Maternal Grandmother 40  . Cancer Maternal Grandfather        lung  . Pneumonia Paternal Grandmother   . Alzheimer's disease Paternal Grandfather   . Diabetes Sister   . Gout Sister   . Hypertension Sister   . Alcohol abuse Brother   . Heart  disease Brother        massive MI    Social History Social History   Tobacco Use  . Smoking status: Former Smoker    Packs/day: 0.50    Types: Cigarettes    Last attempt to quit: 04/07/2016    Years since quitting: 2.5  . Smokeless tobacco: Never Used  . Tobacco comment: patient has not smoked x 10 days.06/28/17 chantix  Substance Use Topics  . Alcohol use: No    Alcohol/week: 0.0 standard drinks  . Drug use: No    Review of Systems Constitutional: Negative for fever. Cardiovascular: Negative for chest pain. Respiratory: Negative for shortness of breath. Gastrointestinal: Moderate lower abdominal pain.  Positive for vaginal bleeding.  Negative for vomiting or diarrhea Genitourinary: Negative for dysuria or hematuria Musculoskeletal: Negative for musculoskeletal complaints Skin: Negative for skin complaints  Neurological: Negative for headache All other ROS negative  ____________________________________________   PHYSICAL EXAM:  VITAL SIGNS: ED Triage Vitals  Enc Vitals Group     BP 10/28/18 1931 126/66     Pulse Rate 10/28/18 1931 84     Resp 10/28/18 1931 20     Temp 10/28/18 1931 98.3 F (36.8 C)     Temp Source 10/28/18 1931 Oral     SpO2 10/28/18 1931 94 %     Weight 10/28/18 1932 193 lb (87.5 kg)     Height 10/28/18 1932 5\' 2"  (1.575 m)     Head Circumference --      Peak Flow --      Pain Score 10/28/18 1931 8     Pain Loc --      Pain Edu? --      Excl. in [place]? --    Constitutional: Alert and oriented. Well appearing and in no distress. Eyes: Normal exam ENT   Head: Normocephalic and atraumatic.   Mouth/Throat: Mucous membranes are moist. Cardiovascular: Normal rate, regular rhythm.  Respiratory: Normal respiratory effort without tachypnea nor retractions.  Left-sided rhonchi Gastrointestinal: Soft, moderate lower abdominal tenderness palpation without rebound or guarding.  No distention.  No upper abdominal tenderness. Musculoskeletal:  Nontender with normal range of motion in all extremities.  Neurologic:  Normal speech and language. No gross focal neurologic deficits  Skin:  Skin is warm, dry and intact.  Psychiatric: Mood and affect are normal. Speech and behavior are normal.   ____________________________________________   RADIOLOGY  CT pending  ____________________________________________   INITIAL IMPRESSION / ASSESSMENT AND PLAN / ED COURSE  Pertinent labs & imaging results that were available during my care of the patient were reviewed by me and considered in my medical decision making (see chart for details).  Patient presents to the emergency department for lower abdominal pain ongoing for several days worse today now with possible spotting/vaginal bleeding.  Will perform a pelvic exam to evaluate for vaginal bleeding.  Patient's urinalysis is normal, no blood seen.  Patient's blood work shows a slight leukocytosis otherwise largely within normal limits.  Given the patient's lower abdominal tenderness to palpation describes an 8/10 pain we will obtain CT imaging of the abdomen/pelvis to further evaluate.  Given the left-sided rhonchi and history of lung cancer we will obtain CT imaging to the chest as well patient was planned for a CT scan of the chest later this month anyways.  Pelvic examination shows a normal examination, bleeding, no vaginal wall tears, no discharge.  Nontender.  CT scan is pending, blood work is largely within normal limits, urinalysis is largely within normal limits.  Patient care signed out to oncoming physician.  ____________________________________________   FINAL CLINICAL IMPRESSION(S) / ED DIAGNOSES  Lower abdominal pain   Harvest Dark, MD 10/28/18 2251

## 2018-10-28 NOTE — Discharge Instructions (Addendum)
Results for orders placed or performed during the hospital encounter of 10/28/18  Lipase, blood  Result Value Ref Range   Lipase 24 11 - 51 U/L  Comprehensive metabolic panel  Result Value Ref Range   Sodium 134 (L) 135 - 145 mmol/L   Potassium 4.7 3.5 - 5.1 mmol/L   Chloride 102 98 - 111 mmol/L   CO2 24 22 - 32 mmol/L   Glucose, Bld 135 (H) 70 - 99 mg/dL   BUN 14 6 - 20 mg/dL   Creatinine, Ser 0.76 0.44 - 1.00 mg/dL   Calcium 8.8 (L) 8.9 - 10.3 mg/dL   Total Protein 7.3 6.5 - 8.1 g/dL   Albumin 3.9 3.5 - 5.0 g/dL   AST 18 15 - 41 U/L   ALT 12 0 - 44 U/L   Alkaline Phosphatase 74 38 - 126 U/L   Total Bilirubin 0.5 0.3 - 1.2 mg/dL   GFR calc non Af Amer >60 >60 mL/min   GFR calc Af Amer >60 >60 mL/min   Anion gap 8 5 - 15  CBC  Result Value Ref Range   WBC 12.6 (H) 4.0 - 10.5 K/uL   RBC 3.95 3.87 - 5.11 MIL/uL   Hemoglobin 10.7 (L) 12.0 - 15.0 g/dL   HCT 33.9 (L) 36.0 - 46.0 %   MCV 85.8 80.0 - 100.0 fL   MCH 27.1 26.0 - 34.0 pg   MCHC 31.6 30.0 - 36.0 g/dL   RDW 13.2 11.5 - 15.5 %   Platelets 300 150 - 400 K/uL   nRBC 0.0 0.0 - 0.2 %  Urinalysis, Complete w Microscopic  Result Value Ref Range   Color, Urine YELLOW (A) YELLOW   APPearance CLOUDY (A) CLEAR   Specific Gravity, Urine 1.015 1.005 - 1.030   pH 8.0 5.0 - 8.0   Glucose, UA NEGATIVE NEGATIVE mg/dL   Hgb urine dipstick SMALL (A) NEGATIVE   Bilirubin Urine NEGATIVE NEGATIVE   Ketones, ur NEGATIVE NEGATIVE mg/dL   Protein, ur NEGATIVE NEGATIVE mg/dL   Nitrite NEGATIVE NEGATIVE   Leukocytes, UA NEGATIVE NEGATIVE   RBC / HPF 0-5 0 - 5 RBC/hpf   WBC, UA 0-5 0 - 5 WBC/hpf   Bacteria, UA RARE (A) NONE SEEN   Squamous Epithelial / LPF 0-5 0 - 5   Mucus PRESENT    Ct Chest W Contrast  Result Date: 10/28/2018 CLINICAL DATA:  Vaginal bleeding for the past 4 days. EXAM: CT CHEST, ABDOMEN, AND PELVIS WITH CONTRAST TECHNIQUE: Multidetector CT imaging of the chest, abdomen and pelvis was performed following the  standard protocol during bolus administration of intravenous contrast. CONTRAST:  16mL ISOVUE-300 IOPAMIDOL (ISOVUE-300) INJECTION 61% COMPARISON:  CT abdomen and pelvis 04/12/2018 FINDINGS: CT CHEST FINDINGS Cardiovascular: Conventional branch pattern of the great vessels without aneurysm or dissection. Normal caliber thoracic aorta without aneurysm or dissection. No large central pulmonary embolus. Normal heart size without pericardial effusion or thickening. Mediastinum/Nodes: No enlarged mediastinal, hilar, or axillary lymph nodes. Thyroid gland, trachea, and esophagus demonstrate no significant findings. Lungs/Pleura: Scattered centrilobular emphysema is noted throughout both lungs. The patient is status post resection of subpleural right lower lobe nodule with postop scarring noted at the right base. Chain sutures are present. No apparent recurrence of mass nor additional new nodules are identified. No effusion is seen. Musculoskeletal: No acute nor aggressive osseous lesions CT ABDOMEN PELVIS FINDINGS Hepatobiliary: No enhancing hepatic mass. Cholecystectomy clips are present. No biliary dilatation is seen. Pancreas: Atrophic pancreas without inflammation,  ductal dilatation or mass. Spleen: Normal size spleen without abnormality. Adrenals/Urinary Tract: Adrenal glands are unremarkable. Kidneys are normal, without renal calculi, focal lesion, or hydronephrosis. Bladder is unremarkable. Stomach/Bowel: The stomach is distended with recently ingested oral contrast. The duodenal sweep and ligament of Treitz are normal. Normal small bowel rotation is visualized. Distal and terminal ileum are unremarkable. The appendix is not visualized but no pericecal inflammation is identified. Large amount of retained stool is seen throughout the colon without inflammatory change. Vascular/Lymphatic: Aortic atherosclerosis. No enlarged abdominal or pelvic lymph nodes. Reproductive: Hysterectomy. No adnexal mass. Other: No free  air nor free fluid. Musculoskeletal: No acute or significant osseous findings. IMPRESSION: 1. Status post resection of right lower lobe subpleural nodule with postop scarring at the right lung base. No recurrence or metastatic disease is identified. 2. Centrilobular emphysema throughout both lungs. 3. Status post cholecystectomy and hysterectomy. 4. No acute bowel inflammation or obstruction. Large amount of retained stool throughout the colon. Emphysema (ICD10-J43.9). Aortic Atherosclerosis (ICD10-I70.0). Electronically Signed   By: Ashley Royalty M.D.   On: 10/28/2018 23:36   Ct Abdomen Pelvis W Contrast  Result Date: 10/28/2018 CLINICAL DATA:  Vaginal bleeding for the past 4 days. EXAM: CT CHEST, ABDOMEN, AND PELVIS WITH CONTRAST TECHNIQUE: Multidetector CT imaging of the chest, abdomen and pelvis was performed following the standard protocol during bolus administration of intravenous contrast. CONTRAST:  143mL ISOVUE-300 IOPAMIDOL (ISOVUE-300) INJECTION 61% COMPARISON:  CT abdomen and pelvis 04/12/2018 FINDINGS: CT CHEST FINDINGS Cardiovascular: Conventional branch pattern of the great vessels without aneurysm or dissection. Normal caliber thoracic aorta without aneurysm or dissection. No large central pulmonary embolus. Normal heart size without pericardial effusion or thickening. Mediastinum/Nodes: No enlarged mediastinal, hilar, or axillary lymph nodes. Thyroid gland, trachea, and esophagus demonstrate no significant findings. Lungs/Pleura: Scattered centrilobular emphysema is noted throughout both lungs. The patient is status post resection of subpleural right lower lobe nodule with postop scarring noted at the right base. Chain sutures are present. No apparent recurrence of mass nor additional new nodules are identified. No effusion is seen. Musculoskeletal: No acute nor aggressive osseous lesions CT ABDOMEN PELVIS FINDINGS Hepatobiliary: No enhancing hepatic mass. Cholecystectomy clips are present. No  biliary dilatation is seen. Pancreas: Atrophic pancreas without inflammation, ductal dilatation or mass. Spleen: Normal size spleen without abnormality. Adrenals/Urinary Tract: Adrenal glands are unremarkable. Kidneys are normal, without renal calculi, focal lesion, or hydronephrosis. Bladder is unremarkable. Stomach/Bowel: The stomach is distended with recently ingested oral contrast. The duodenal sweep and ligament of Treitz are normal. Normal small bowel rotation is visualized. Distal and terminal ileum are unremarkable. The appendix is not visualized but no pericecal inflammation is identified. Large amount of retained stool is seen throughout the colon without inflammatory change. Vascular/Lymphatic: Aortic atherosclerosis. No enlarged abdominal or pelvic lymph nodes. Reproductive: Hysterectomy. No adnexal mass. Other: No free air nor free fluid. Musculoskeletal: No acute or significant osseous findings. IMPRESSION: 1. Status post resection of right lower lobe subpleural nodule with postop scarring at the right lung base. No recurrence or metastatic disease is identified. 2. Centrilobular emphysema throughout both lungs. 3. Status post cholecystectomy and hysterectomy. 4. No acute bowel inflammation or obstruction. Large amount of retained stool throughout the colon. Emphysema (ICD10-J43.9). Aortic Atherosclerosis (ICD10-I70.0). Electronically Signed   By: Ashley Royalty M.D.   On: 10/28/2018 23:36   Dg C-arm 1-60 Min-no Report  Result Date: 10/27/2018 Fluoroscopy was utilized by the requesting physician.  No radiographic interpretation.   Dg C-arm 1-60 Min-no Report  Result Date: 09/29/2018 Fluoroscopy was utilized by the requesting physician.  No radiographic interpretation.

## 2018-10-28 NOTE — ED Notes (Signed)
Pt also reports to RN  headache 8/10

## 2018-10-28 NOTE — ED Notes (Signed)
Patient transported to CT 

## 2018-11-01 DIAGNOSIS — M25561 Pain in right knee: Secondary | ICD-10-CM | POA: Diagnosis not present

## 2018-11-01 DIAGNOSIS — M25362 Other instability, left knee: Secondary | ICD-10-CM | POA: Diagnosis not present

## 2018-11-01 DIAGNOSIS — Z96653 Presence of artificial knee joint, bilateral: Secondary | ICD-10-CM | POA: Diagnosis not present

## 2018-11-01 DIAGNOSIS — G8929 Other chronic pain: Secondary | ICD-10-CM | POA: Diagnosis not present

## 2018-11-01 DIAGNOSIS — M25562 Pain in left knee: Secondary | ICD-10-CM | POA: Diagnosis not present

## 2018-11-03 ENCOUNTER — Encounter: Payer: Self-pay | Admitting: Psychiatry

## 2018-11-03 ENCOUNTER — Ambulatory Visit (INDEPENDENT_AMBULATORY_CARE_PROVIDER_SITE_OTHER): Payer: Medicare HMO | Admitting: Psychiatry

## 2018-11-03 VITALS — BP 128/89 | HR 71 | Ht 63.0 in | Wt 191.0 lb

## 2018-11-03 DIAGNOSIS — F411 Generalized anxiety disorder: Secondary | ICD-10-CM | POA: Diagnosis not present

## 2018-11-03 DIAGNOSIS — F17200 Nicotine dependence, unspecified, uncomplicated: Secondary | ICD-10-CM | POA: Diagnosis not present

## 2018-11-03 DIAGNOSIS — G3184 Mild cognitive impairment, so stated: Secondary | ICD-10-CM

## 2018-11-03 DIAGNOSIS — F5101 Primary insomnia: Secondary | ICD-10-CM

## 2018-11-03 DIAGNOSIS — F330 Major depressive disorder, recurrent, mild: Secondary | ICD-10-CM

## 2018-11-03 MED ORDER — MIRTAZAPINE 7.5 MG PO TABS: 7.5000 mg | ORAL_TABLET | Freq: Every day | ORAL | 0 refills | Status: DC

## 2018-11-03 MED ORDER — HYDROXYZINE PAMOATE 25 MG PO CAPS: 25.0000 mg | ORAL_CAPSULE | Freq: Two times a day (BID) | ORAL | 1 refills | Status: DC | PRN

## 2018-11-03 NOTE — Patient Instructions (Signed)
Hydroxyzine capsules or tablets What is this medicine? HYDROXYZINE (hye DROX i zeen) is an antihistamine. This medicine is used to treat allergy symptoms. It is also used to treat anxiety and tension. This medicine can be used with other medicines to induce sleep before surgery. This medicine may be used for other purposes; ask your health care provider or pharmacist if you have questions. COMMON BRAND NAME(S): ANX, Atarax, Rezine, Vistaril What should I tell my health care provider before I take this medicine? They need to know if you have any of these conditions: -glaucoma -heart disease -history of irregular heartbeat -kidney disease -liver disease -lung or breathing disease, like asthma -stomach or intestine problems -thyroid disease -trouble passing urine -an unusual or allergic reaction to hydroxyzine, cetirizine, other medicines, foods, dyes or preservatives -pregnant or trying to get pregnant -breast-feeding How should I use this medicine? Take this medicine by mouth with a full glass of water. Follow the directions on the prescription label. You may take this medicine with food or on an empty stomach. Take your medicine at regular intervals. Do not take your medicine more often than directed. Talk to your pediatrician regarding the use of this medicine in children. Special care may be needed. While this drug may be prescribed for children as young as 6 years of age for selected conditions, precautions do apply. Patients over 65 years old may have a stronger reaction and need a smaller dose. Overdosage: If you think you have taken too much of this medicine contact a poison control center or emergency room at once. NOTE: This medicine is only for you. Do not share this medicine with others. What if I miss a dose? If you miss a dose, take it as soon as you can. If it is almost time for your next dose, take only that dose. Do not take double or extra doses. What may interact with this  medicine? Do not take this medicine with any of the following medications: -cisapride -dofetilide -dronedarone -pimozide -thioridazine This medicine may also interact with the following medications: -alcohol -antihistamines for allergy, cough, and cold -atropine -barbiturate medicines for sleep or seizures, like phenobarbital -certain antibiotics like erythromycin or clarithromycin -certain medicines for anxiety or sleep -certain medicines for bladder problems like oxybutynin, tolterodine -certain medicines for depression or psychotic disturbances -certain medicines for irregular heart beat -certain medicines for Parkinson's disease like benztropine, trihexyphenidyl -certain medicines for seizures like phenobarbital, primidone -certain medicines for stomach problems like dicyclomine, hyoscyamine -certain medicines for travel sickness like scopolamine -ipratropium -narcotic medicines for pain -other medicines that prolong the QT interval (an abnormal heart rhythm) This list may not describe all possible interactions. Give your health care provider a list of all the medicines, herbs, non-prescription drugs, or dietary supplements you use. Also tell them if you smoke, drink alcohol, or use illegal drugs. Some items may interact with your medicine. What should I watch for while using this medicine? Tell your doctor or health care professional if your symptoms do not improve. You may get drowsy or dizzy. Do not drive, use machinery, or do anything that needs mental alertness until you know how this medicine affects you. Do not stand or sit up quickly, especially if you are an older patient. This reduces the risk of dizzy or fainting spells. Alcohol may interfere with the effect of this medicine. Avoid alcoholic drinks. Your mouth may get dry. Chewing sugarless gum or sucking hard candy, and drinking plenty of water may help. Contact your doctor if   the problem does not go away or is  severe. This medicine may cause dry eyes and blurred vision. If you wear contact lenses you may feel some discomfort. Lubricating drops may help. See your eye doctor if the problem does not go away or is severe. If you are receiving skin tests for allergies, tell your doctor you are using this medicine. What side effects may I notice from receiving this medicine? Side effects that you should report to your doctor or health care professional as soon as possible: -allergic reactions like skin rash, itching or hives, swelling of the face, lips, or tongue -changes in vision -confusion -fast, irregular heartbeat -seizures -tremor -trouble passing urine or change in the amount of urine Side effects that usually do not require medical attention (report to your doctor or health care professional if they continue or are bothersome): -constipation -drowsiness -dry mouth -headache -tiredness This list may not describe all possible side effects. Call your doctor for medical advice about side effects. You may report side effects to FDA at 1-800-FDA-1088. Where should I keep my medicine? Keep out of the reach of children. Store at room temperature between 15 and 30 degrees C (59 and 86 degrees F). Keep container tightly closed. Throw away any unused medicine after the expiration date. NOTE: This sheet is a summary. It may not cover all possible information. If you have questions about this medicine, talk to your doctor, pharmacist, or health care provider.  2019 [employer] (2018-04-18 13:25:13) Mirtazapine tablets What is this medicine? MIRTAZAPINE (mir TAZ a peen) is used to treat depression. This medicine may be used for other purposes; ask your health care provider or pharmacist if you have questions. COMMON BRAND NAME(S): Remeron What should I tell my health care provider before I take this medicine? They need to know if you have any of these conditions: -bipolar  disorder -glaucoma -kidney disease -liver disease -suicidal thoughts -an unusual or allergic reaction to mirtazapine, other medicines, foods, dyes, or preservatives -pregnant or trying to get pregnant -breast-feeding How should I use this medicine? Take this medicine by mouth with a glass of water. Follow the directions on the prescription label. Take your medicine at regular intervals. Do not take your medicine more often than directed. Do not stop taking this medicine suddenly except upon the advice of your doctor. Stopping this medicine too quickly may cause serious side effects or your condition may worsen. A special MedGuide will be given to you by the pharmacist with each prescription and refill. Be sure to read this information carefully each time. Talk to your pediatrician regarding the use of this medicine in children. Special care may be needed. Overdosage: If you think you have taken too much of this medicine contact a poison control center or emergency room at once. NOTE: This medicine is only for you. Do not share this medicine with others. What if I miss a dose? If you miss a dose, take it as soon as you can. If it is almost time for your next dose, take only that dose. Do not take double or extra doses. What may interact with this medicine? Do not take this medicine with any of the following medications: -linezolid -MAOIs like Carbex, Eldepryl, Marplan, Nardil, and Parnate -methylene blue (injected into a vein) This medicine may also interact with the following medications: -alcohol -antiviral medicines for HIV or AIDS -certain medicines that treat or prevent blood clots like warfarin -certain medicines for depression, anxiety, or psychotic disturbances -certain medicines  for fungal infections like ketoconazole and itraconazole -certain medicines for migraine headache like almotriptan, eletriptan, frovatriptan, naratriptan, rizatriptan, sumatriptan, zolmitriptan -certain  medicines for seizures like carbamazepine or phenytoin -certain medicines for sleep -cimetidine -erythromycin -fentanyl -lithium -medicines for blood pressure -nefazodone -rasagiline -rifampin -supplements like St. John's wort, kava kava, valerian -tramadol -tryptophan This list may not describe all possible interactions. Give your health care provider a list of all the medicines, herbs, non-prescription drugs, or dietary supplements you use. Also tell them if you smoke, drink alcohol, or use illegal drugs. Some items may interact with your medicine. What should I watch for while using this medicine? Tell your doctor if your symptoms do not get better or if they get worse. Visit your doctor or health care professional for regular checks on your progress. Because it may take several weeks to see the full effects of this medicine, it is important to continue your treatment as prescribed by your doctor. Patients and their families should watch out for new or worsening thoughts of suicide or depression. Also watch out for sudden changes in feelings such as feeling anxious, agitated, panicky, irritable, hostile, aggressive, impulsive, severely restless, overly excited and hyperactive, or not being able to sleep. If this happens, especially at the beginning of treatment or after a change in dose, call your health care professional. Dennis Bast may get drowsy or dizzy. Do not drive, use machinery, or do anything that needs mental alertness until you know how this medicine affects you. Do not stand or sit up quickly, especially if you are an older patient. This reduces the risk of dizzy or fainting spells. Alcohol may interfere with the effect of this medicine. Avoid alcoholic drinks. This medicine may cause dry eyes and blurred vision. If you wear contact lenses you may feel some discomfort. Lubricating drops may help. See your eye doctor if the problem does not go away or is severe. Your mouth may get dry.  Chewing sugarless gum or sucking hard candy, and drinking plenty of water may help. Contact your doctor if the problem does not go away or is severe. What side effects may I notice from receiving this medicine? Side effects that you should report to your doctor or health care professional as soon as possible: -allergic reactions like skin rash, itching or hives, swelling of the face, lips, or tongue -anxious -changes in vision -chest pain -confusion -elevated mood, decreased need for sleep, racing thoughts, impulsive behavior -eye pain -fast, irregular heartbeat -feeling faint or lightheaded, falls -feeling agitated, angry, or irritable -fever or chills, sore throat -hallucination, loss of contact with reality -loss of balance or coordination -mouth sores -redness, blistering, peeling or loosening of the skin, including inside the mouth -restlessness, pacing, inability to keep still -seizures -stiff muscles -suicidal thoughts or other mood changes -trouble passing urine or change in the amount of urine -trouble sleeping -unusual bleeding or bruising -unusually weak or tired -vomiting Side effects that usually do not require medical attention (report to your doctor or health care professional if they continue or are bothersome): -change in appetite -constipation -dizziness -dry mouth -muscle aches or pains -nausea -tired -weight gain This list may not describe all possible side effects. Call your doctor for medical advice about side effects. You may report side effects to FDA at 1-800-FDA-1088. Where should I keep my medicine? Keep out of the reach of children. Store at room temperature between 15 and 30 degrees C (59 and 86 degrees F) Protect from light and moisture. Throw away  any unused medicine after the expiration date. NOTE: This sheet is a summary. It may not cover all possible information. If you have questions about this medicine, talk to your doctor, pharmacist, or  health care provider.  2019 [employer] (2016-03-05 17:30:45)

## 2018-11-03 NOTE — Progress Notes (Signed)
[facility] OP Progress Note  11/03/2018 10:16 AM Susan Houston  MRN:  [id number]  Chief Complaint: ' I am here for follow up.' Chief Complaint    Follow-up     HPI: Susan Houston is a 58 year old Caucasian female, married, unemployed, lives in [place], has a history of depression, vitamin B12 deficiency, mild cognitive impairment, migraine, COPD, chronic pain, degenerative disc disease, tremors, hyperlipidemia, presented to the clinic today for a follow-up visit.  Patient today returns for a follow-up visit.  Her last visit here was on 01/14/2018.  Patient reports a lot happened after her visit here.  She reports she was diagnosed with primary lung cancer and had surgery.  She had surgery in July 2019.  She reports she is recovering okay except for having chronic pain due to the surgery.  She continues to work with her oncologist and other providers on the same.  Patient reports she has a CT scan scheduled for tomorrow to reevaluate.  Patient reports she continues to struggle with mood symptoms.  She describes her symptoms as sadness, irritability, mood swings and so on.  She also struggles with sleep.  Patient reports she is not taking any of the medications that she was on previously like the Depakote which was for migraine headaches, Seroquel for mood symptoms and sleep, BuSpar and Zoloft.  She reports she was started on Klonopin recently by her primary medical doctor and she takes 1 mg at bedtime.  Discussed medication options.  She has tried medications like Zoloft, Lexapro previously and did not have a good response or had side effects.  Discussed Remeron.  She agrees with plan.  Also discussed with her that she could combine the mirtazapine with trazodone if she continues to struggle with sleep.  Provided her education about side effects including dizziness, falls and increased appetite.  Also discussed referral for psychotherapy sessions with therapist here in clinic given her most recent psychosocial  stressors.  Patient with history of cognitive problems.  Provided her education about being on medications like benzodiazepines.  I have reviewed medical records in [facility] per Dr.Brahmanday -dated 05/27/2018' per progress note-patient with stage II laryngeal cancer history and also right lower lobe lung nodule-status post wedge resection-recovering well.Pt advised about surveillance imaging every 6 months'          Visit Diagnosis:    ICD-10-CM   1. MDD (major depressive disorder), recurrent episode, mild (HCC) F33.0 mirtazapine (REMERON) 7.5 MG tablet    hydrOXYzine (VISTARIL) 25 MG capsule  2. Primary insomnia F51.01   3. GAD (generalized anxiety disorder) F41.1   4. MCI (mild cognitive impairment) G31.84   5. Tobacco use disorder F17.200    in remission     Past Psychiatric History: Reviewed past psychiatric history from my progress note on 01/14/2018.  Trials of Lexapro.  Past Medical History:  Past Medical History:  Diagnosis Date  . Anemia    vitamin b12 deficiency. no longer taking supplements  . Anxiety   . Arthritis   . Asthma   . Back pain   . Bruises easily   . Cancer ([place]) 2009   LARYNX CANCER - CHEMO / RADIATION (NO SURGERY)   . Chronic leg pain    BILATERAL  . COPD (chronic obstructive pulmonary disease) (HCC)    no inhalers for over 1 year  . Depression   . Difficulty sleeping   . Diverticulitis   . Dysrhythmia 04/2018   brady episodes. cleared by dr. Clayborn Bigness  . Facet  syndrome, lumbar 03/28/2015  . Hemorrhoids   . History of kidney stones 04/2018   recently passed stone  . Hyperlipidemia   . Hypertension 2019   not on any treatment currently  . Incontinence of urine   . Laryngeal cancer ([place]) 2009  . Lung cancer ([place]) 04/2018   diagnosed via ct scan  . Lung cancer ([place]) 04/18/2018  . Migraine   . Migraine without aura and without status migrainosus, not intractable 09/11/2016  . Mild cognitive impairment 11/23/2017  . MRSA infection greater than  3 months ago    2008 right side of face  . Nerve damage    right leg. thinks this is from her TKR  . Nodule of lower lobe of right lung 04/22/2018  . Oxygen deficiency    2L/HS  . Wears dentures    full upper and lower    Past Surgical History:  Procedure Laterality Date  . ABDOMINAL HYSTERECTOMY  1983  . BLADDER SUSPENSION  2014  . CATARACT EXTRACTION W/PHACO Left 05/11/2018   Procedure: CATARACT EXTRACTION PHACO AND INTRAOCULAR LENS PLACEMENT ([place]) LEFT;  Surgeon: Leandrew Koyanagi, MD;  Location: [facility];  Service: Ophthalmology;  Laterality: Left;  PREFERS EARLY  . CATARACT EXTRACTION W/PHACO Right 08/11/2018   Procedure: CATARACT EXTRACTION PHACO AND INTRAOCULAR LENS PLACEMENT ([facility]);  Surgeon: Leandrew Koyanagi, MD;  Location: [facility];  Service: Ophthalmology;  Laterality: Right;  CDE 5:02 Total Time 01:35 Fluid lot # [id number]  . CHOLECYSTECTOMY  1998  . COLONOSCOPY    . ESOPHAGOGASTRODUODENOSCOPY  2015  . ESOPHAGOGASTRODUODENOSCOPY ([facility]) WITH PROPOFOL N/A 11/20/2016   Procedure: ESOPHAGOGASTRODUODENOSCOPY ([facility]) WITH PROPOFOL;  Surgeon: Jonathon Bellows, MD;  Location: [facility];  Service: Endoscopy;  Laterality: N/A;  . EXCISION NEUROMA Bilateral 09/24/2014   Procedure: BILATERAL OPEN SAPHENOUS NEURECTOMIES AND OPEN LEFT PERIPATELLA OSETOPHYTECTOMY;  Surgeon: Mauri Pole, MD;  Location: [facility];  Service: Orthopedics;  Laterality: Bilateral;  . EYE SURGERY Left 05/11/2018   cataract extraction  . JOINT REPLACEMENT  2009/2010   BIL TOTAL KNEES  . THORACOTOMY/LOBECTOMY Right 05/16/2018   Procedure: THORACOTOMY/POSSIBLE LOBECTOMY;  Surgeon: Nestor Lewandowsky, MD;  Location: [facility];  Service: Thoracic;  Laterality: Right;  . TONSILLECTOMY    . VIDEO BRONCHOSCOPY N/A 05/16/2018   Procedure: PREOP  BRONCHOSCOPY;  Surgeon: Nestor Lewandowsky, MD;  Location: [facility];  Service: Thoracic;  Laterality: N/A;    Family Psychiatric History: Reviewed family psychiatric history from  my progress note on 01/14/2018. Family History:  Family History  Problem Relation Age of Onset  . Asthma Mother   . Diabetes Mother   . Hyperlipidemia Mother   . Hypertension Mother   . Cirrhosis Mother        Non-alcoholic  . Arthritis Father   . Cancer Father        Bone  . Hyperlipidemia Father   . Hypertension Father   . Anxiety disorder Sister   . Depression Sister   . Kidney Stones Son   . Stroke Maternal Grandmother   . Breast cancer Maternal Grandmother 3  . Cancer Maternal Grandfather        lung  . Pneumonia Paternal Grandmother   . Alzheimer's disease Paternal Grandfather   . Diabetes Sister   . Gout Sister   . Hypertension Sister   . Alcohol abuse Brother   . Heart disease Brother        massive MI    Social History: Reviewed social history from my progress note from 01/14/2018.  Social History   Socioeconomic History  . Marital status: Married    Spouse name: Tharon Aquas  . Number of children: 2  . Years of education: 91  . Highest education level: 12th grade  Occupational History  . Occupation: Disabled  Social Needs  . Financial resource strain: Not hard at all  . Food insecurity:    Worry: Never true    Inability: Never true  . Transportation needs:    Medical: No    Non-medical: No  Tobacco Use  . Smoking status: Former Smoker    Packs/day: 0.50    Types: Cigarettes    Last attempt to quit: 04/07/2016    Years since quitting: 2.5  . Smokeless tobacco: Never Used  . Tobacco comment: patient has not smoked x 10 days.06/28/17 chantix  Substance and Sexual Activity  . Alcohol use: No    Alcohol/week: 0.0 standard drinks  . Drug use: No  . Sexual activity: Yes    Partners: Male    Birth control/protection: None  Lifestyle  . Physical activity:    Days per week: 0 days    Minutes per session: 0 min  . Stress: Not at all  Relationships  . Social connections:    Talks on phone: Never    Gets together: Twice a week    Attends religious  service: More than 4 times per year    Active member of club or organization: No    Attends meetings of clubs or organizations: Never    Relationship status: Married  Other Topics Concern  . Not on file  Social History Narrative   Lives at home w/ her husband   Right-handed   Caffeine: occasionally    Allergies:  Allergies  Allergen Reactions  . Morphine And Related Itching  . Cymbalta [Duloxetine Hcl] Other (See Comments)    Pt states it keeps her awake  . Adhesive [Tape] Other (See Comments)    Plastic tape rips skin and bruises her. PLEASE USE PAPER TAPE  . Lexapro [Escitalopram Oxalate] Other (See Comments)    Keeps awake for days.     Metabolic Disorder Labs: No results found for: HGBA1C, MPG No results found for: PROLACTIN Lab Results  Component Value Date   CHOL 169 12/03/2017   TRIG 126 12/03/2017   HDL 45 12/03/2017   LDLCALC 99 12/03/2017   LDLCALC 87 07/10/2016   Lab Results  Component Value Date   TSH 3.676 09/11/2018   TSH 3.16 05/21/2014    Therapeutic Level Labs: No results found for: LITHIUM No results found for: VALPROATE No components found for:  CBMZ  Current Medications: Current Outpatient Medications  Medication Sig Dispense Refill  . albuterol (PROAIR HFA) 108 (90 Base) MCG/ACT inhaler INL 2 PFS ITL Q 4 H PRF WHZ OR SOB    . albuterol (PROVENTIL HFA;VENTOLIN HFA) 108 (90 Base) MCG/ACT inhaler Inhale 2 puffs into the lungs every 4 (four) hours as needed for wheezing or shortness of breath. 1 Inhaler 2  . aspirin 81 MG tablet Take 81 mg by mouth daily.    . clonazePAM (KLONOPIN) 1 MG tablet Take 1 tablet (1 mg total) by mouth at bedtime. 30 tablet 0  . gabapentin (NEURONTIN) 300 MG capsule Take 1-3 capsules (300-900 mg total) by mouth 4 (four) times daily. Follow the written titration instructions. (Patient taking differently: Take 600 mg by mouth at bedtime. Follow the written titration instructions.) 360 capsule 2  . traZODone (DESYREL) 50  MG tablet Take 0.5-1  tablets (25-50 mg total) by mouth at bedtime as needed for sleep. 30 tablet 3  . hydrOXYzine (VISTARIL) 25 MG capsule Take 1 capsule (25 mg total) by mouth 2 (two) times daily as needed. For severe anxiety symptoms 60 capsule 1  . mirtazapine (REMERON) 7.5 MG tablet Take 1 tablet (7.5 mg total) by mouth at bedtime. 30 tablet 0   No current facility-administered medications for this visit.    Facility-Administered Medications Ordered in Other Visits  Medication Dose Route Frequency Provider Last Rate Last Dose  . ipratropium-albuterol (DUONEB) 0.5-2.5 (3) MG/3ML nebulizer solution 3 mL  3 mL Nebulization Q6H Burns, Wandra Feinstein, NP         Musculoskeletal: Strength & Muscle Tone: within normal limits Gait & Station: normal Patient leans: N/A  Psychiatric Specialty Exam: Review of Systems  Psychiatric/Behavioral: Positive for depression. The patient is nervous/anxious and has insomnia.   All other systems reviewed and are negative.   Blood pressure 128/89, pulse 71, height 5\' 3"  (1.6 m), weight 191 lb (86.6 kg).Body mass index is 33.83 kg/m.  General Appearance: Casual  Eye Contact:  Fair  Speech:  Clear and Coherent  Volume:  Normal  Mood:  Depressed and Dysphoric  Affect:  Congruent  Thought Process:  Goal Directed and Descriptions of Associations: Intact  Orientation:  Full (Time, Place, and Person)  Thought Content: Logical   Suicidal Thoughts:  No  Homicidal Thoughts:  No  Memory:  Immediate;   Fair Recent;   Fair Remote;   Fair  Judgement:  Fair  Insight:  Fair  Psychomotor Activity:  Normal  Concentration:  Concentration: Fair and Attention Span: Fair  Recall:  [employer] of Knowledge: Fair  Language: Fair  Akathisia:  No  Handed:  Right  AIMS (if indicated): denies rigidity,stiffness  Assets:  Communication Skills Desire for Improvement Social Support  ADL's:  Intact  Cognition: WNL  Sleep:  Poor   Screenings: GAD-7     Office Visit  from 10/25/2018 in [facility] Visit from 09/16/2018 in [facility] Office Visit from 07/13/2018 in [facility] Office Visit from 06/28/2018 in [facility] Office Visit from 04/15/2018 in [facility]  Total GAD-7 Score  9  9  19  6  8     PHQ2-9     Procedure visit from 10/27/2018 in [facility] Office Visit from 10/25/2018 in [facility] Procedure visit from 09/29/2018 in [facility] Office Visit from 09/16/2018 in [facility] Procedure visit from 09/08/2018 in [facility]  PHQ-2 Total Score  1  1  0  2  0  PHQ-9 Total Score  -  7  5  13   -       Assessment and Plan: [place] is a 57 year old Caucasian female, history of depression , history of delirium, tremors, migraine headaches, falls, vitamin B12 deficiency, MCI, gait instability, bilateral knee pain, presented to the clinic today for a follow-up visit.  Patient was initially referred to the clinic by neurology to get a second opinion on her psychiatric medications due to her most recent confusion memory loss and falls.  Patient today returns for a follow-up visit.  Patient currently recovering from primary lung cancer stage I status post resection.  Patient continues to struggle with mood lability and sleep problems.  Discussed plan as noted below.  Plan MDD-unstable Start mirtazapine 7.5 mg  p.o. nightly Discussed with her she could continue trazodone 50 mg as needed.  For generalized anxiety-unstable Hydroxyzine 25 mg p.o. twice daily as needed She does have Klonopin 1 mg at bedtime prescribed per PMD. Mirtazapine 7.5 mg p.o. nightly Discussed with patient about limiting use of medications like Klonopin and hydroxyzine. We will refer her for CBT with therapist here in clinic.  Discussed frequent therapy visits.  For  MCI Patient continue to follow-up with neurology. Patient was previously on vitamin B12 replacement.  For insomnia - unstable Remeron 7.5 mg po qhs. She also has Trazodone 50 mg at bedtime as needed.   Discussed with patient to sign a release to obtain medical records from her previous psychiatrist again today-Dr.Su.  I have reviewed medical records in [facility] per her provider Dr. Rogue Bussing- dated 05/27/2018 -summarized above.  I have reviewed TSH in [facility] dated 09/11/2018-within normal limits.  Follow-up in clinic in 10 days or sooner if needed.  I have spent atleast 25 minutes  face to face with patient today. More than 50 % of the time was spent for psychoeducation and supportive psychotherapy and care coordination.  This note was generated in part or whole with voice recognition software. Voice recognition is usually quite accurate but there are transcription errors that can and very often do occur. I apologize for any typographical errors that were not detected and corrected.          Ursula Alert, MD 11/03/2018, 10:16 AM

## 2018-11-04 ENCOUNTER — Ambulatory Visit
Admission: RE | Admit: 2018-11-04 | Discharge: 2018-11-04 | Disposition: A | Discharge status: Home / Self Care | Payer: Medicare HMO | Source: Ambulatory Visit | Attending: Internal Medicine | Admitting: Internal Medicine

## 2018-11-04 ENCOUNTER — Telehealth: Payer: Self-pay

## 2018-11-04 ENCOUNTER — Other Ambulatory Visit: Payer: Self-pay | Admitting: *Deleted

## 2018-11-04 ENCOUNTER — Ambulatory Visit (INDEPENDENT_AMBULATORY_CARE_PROVIDER_SITE_OTHER): Payer: Medicare HMO | Admitting: Family Medicine

## 2018-11-04 ENCOUNTER — Other Ambulatory Visit: Payer: Self-pay

## 2018-11-04 ENCOUNTER — Encounter: Payer: Self-pay | Admitting: Family Medicine

## 2018-11-04 VITALS — BP 135/83 | HR 74 | Temp 98.6°F | Ht 62.0 in | Wt 193.0 lb

## 2018-11-04 DIAGNOSIS — N611 Abscess of the breast and nipple: Secondary | ICD-10-CM

## 2018-11-04 DIAGNOSIS — R062 Wheezing: Secondary | ICD-10-CM | POA: Diagnosis not present

## 2018-11-04 DIAGNOSIS — C3431 Malignant neoplasm of lower lobe, right bronchus or lung: Secondary | ICD-10-CM

## 2018-11-04 DIAGNOSIS — F5101 Primary insomnia: Secondary | ICD-10-CM | POA: Diagnosis not present

## 2018-11-04 DIAGNOSIS — J441 Chronic obstructive pulmonary disease with (acute) exacerbation: Secondary | ICD-10-CM

## 2018-11-04 DIAGNOSIS — F331 Major depressive disorder, recurrent, moderate: Secondary | ICD-10-CM

## 2018-11-04 MED ORDER — ALBUTEROL SULFATE (2.5 MG/3ML) 0.083% IN NEBU
2.5000 mg | INHALATION_SOLUTION | Freq: Once | RESPIRATORY_TRACT | Status: AC
  Administered 2018-11-04: 2.5 mg via RESPIRATORY_TRACT

## 2018-11-04 MED ORDER — PREDNISONE 50 MG PO TABS: 50.0000 mg | ORAL_TABLET | Freq: Every day | ORAL | 0 refills | Status: DC

## 2018-11-04 MED ORDER — FLUOXETINE HCL 20 MG PO CAPS: 20.0000 mg | ORAL_CAPSULE | Freq: Every day | ORAL | 0 refills | Status: DC

## 2018-11-04 MED ORDER — AZITHROMYCIN 250 MG PO TABS: ORAL_TABLET | ORAL | 0 refills | Status: DC

## 2018-11-04 MED ORDER — TRAZODONE HCL 150 MG PO TABS: 150.0000 mg | ORAL_TABLET | Freq: Every day | ORAL | 1 refills | Status: DC

## 2018-11-04 NOTE — Progress Notes (Signed)
BP 135/83   Pulse 74   Temp 98.6 F (37 C) (Oral)   Ht 5\' 2"  (1.575 m)   Wt 193 lb (87.5 kg)   SpO2 97%   BMI 35.30 kg/m    Subjective:    Patient ID: Susan Houston, female    DOB: 07/31/1961, 58 y.o.   MRN: [id number]  HPI: Susan Houston is a 57 y.o. female  Chief Complaint  Patient presents with  . Abscess    f/u  . Medication Refill    klonopin   INSOMNIA- saw psychiatry, started on 2 medicine, working on getting her more sleep. Currently still not sleeping well.  Duration: chronic Satisfied with sleep quality: no Difficulty falling asleep: yes Difficulty staying asleep: yes Waking a few hours after sleep onset: yes Early morning awakenings: no Daytime hypersomnolence: no Wakes feeling refreshed: no Good sleep hygiene: no Apnea: no Snoring: no Depressed/anxious mood: yes Recent stress: yes Restless legs/nocturnal leg cramps: no Chronic pain/arthritis: no History of sleep study: no Treatments attempted: melatonin, uinsom, benadryl and ambien   Spot on her breast is better- pretty much gone.  Relevant past medical, surgical, family and social history reviewed and updated as indicated. Interim medical history since our last visit reviewed. Allergies and medications reviewed and updated.  Review of Systems  Constitutional: Positive for chills, diaphoresis and fatigue. Negative for activity change, appetite change, fever and unexpected weight change.  HENT: Positive for congestion, postnasal drip and rhinorrhea. Negative for dental problem, drooling, ear discharge, ear pain, facial swelling, hearing loss, mouth sores, nosebleeds, sinus pressure, sinus pain, sneezing, sore throat, tinnitus, trouble swallowing and voice change.   Respiratory: Positive for cough, chest tightness, shortness of breath and wheezing. Negative for apnea, choking and stridor.   Cardiovascular: Negative.   Gastrointestinal: Negative.   Psychiatric/Behavioral: Negative.     Per HPI unless  specifically indicated above     Objective:    BP 135/83   Pulse 74   Temp 98.6 F (37 C) (Oral)   Ht 5\' 2"  (1.575 m)   Wt 193 lb (87.5 kg)   SpO2 97%   BMI 35.30 kg/m   Wt Readings from Last 3 Encounters:  11/04/18 193 lb (87.5 kg)  11/03/18 191 lb (86.6 kg)  10/28/18 193 lb (87.5 kg)    Physical Exam Vitals signs and nursing note reviewed.  Constitutional:      General: She is not in acute distress.    Appearance: Normal appearance. She is not ill-appearing, toxic-appearing or diaphoretic.  HENT:     Head: Normocephalic and atraumatic.     Right Ear: External ear normal.     Left Ear: External ear normal.     Nose: Nose normal.     Mouth/Throat:     Mouth: Mucous membranes are moist.     Pharynx: Oropharynx is clear.  Eyes:     General: No scleral icterus.       Right eye: No discharge.        Left eye: No discharge.     Extraocular Movements: Extraocular movements intact.     Conjunctiva/sclera: Conjunctivae normal.     Pupils: Pupils are equal, round, and reactive to light.  Neck:     Musculoskeletal: Normal range of motion and neck supple.  Cardiovascular:     Rate and Rhythm: Normal rate and regular rhythm.     Pulses: Normal pulses.     Heart sounds: Normal heart sounds. No murmur. No friction rub.  No gallop.   Pulmonary:     Effort: Pulmonary effort is normal. No respiratory distress.     Breath sounds: No stridor. Wheezing (throughout) present. No rhonchi or rales.  Chest:     Chest wall: No tenderness.  Musculoskeletal: Normal range of motion.  Skin:    General: Skin is warm and dry.     Capillary Refill: Capillary refill takes less than 2 seconds.     Coloration: Skin is not jaundiced or pale.     Findings: No bruising, erythema, lesion or rash.  Neurological:     General: No focal deficit present.     Mental Status: She is alert and oriented to person, place, and time. Mental status is at baseline.  Psychiatric:        Mood and Affect: Mood  normal.        Behavior: Behavior normal.        Thought Content: Thought content normal.        Judgment: Judgment normal.     Results for orders placed or performed during the hospital encounter of 10/28/18  Lipase, blood  Result Value Ref Range   Lipase 24 11 - 51 U/L  Comprehensive metabolic panel  Result Value Ref Range   Sodium 134 (L) 135 - 145 mmol/L   Potassium 4.7 3.5 - 5.1 mmol/L   Chloride 102 98 - 111 mmol/L   CO2 24 22 - 32 mmol/L   Glucose, Bld 135 (H) 70 - 99 mg/dL   BUN 14 6 - 20 mg/dL   Creatinine, Ser 0.76 0.44 - 1.00 mg/dL   Calcium 8.8 (L) 8.9 - 10.3 mg/dL   Total Protein 7.3 6.5 - 8.1 g/dL   Albumin 3.9 3.5 - 5.0 g/dL   AST 18 15 - 41 U/L   ALT 12 0 - 44 U/L   Alkaline Phosphatase 74 38 - 126 U/L   Total Bilirubin 0.5 0.3 - 1.2 mg/dL   GFR calc non Af Amer >60 >60 mL/min   GFR calc Af Amer >60 >60 mL/min   Anion gap 8 5 - 15  CBC  Result Value Ref Range   WBC 12.6 (H) 4.0 - 10.5 K/uL   RBC 3.95 3.87 - 5.11 MIL/uL   Hemoglobin 10.7 (L) 12.0 - 15.0 g/dL   HCT 33.9 (L) 36.0 - 46.0 %   MCV 85.8 80.0 - 100.0 fL   MCH 27.1 26.0 - 34.0 pg   MCHC 31.6 30.0 - 36.0 g/dL   RDW 13.2 11.5 - 15.5 %   Platelets 300 150 - 400 K/uL   nRBC 0.0 0.0 - 0.2 %  Urinalysis, Complete w Microscopic  Result Value Ref Range   Color, Urine YELLOW (A) YELLOW   APPearance CLOUDY (A) CLEAR   Specific Gravity, Urine 1.015 1.005 - 1.030   pH 8.0 5.0 - 8.0   Glucose, UA NEGATIVE NEGATIVE mg/dL   Hgb urine dipstick SMALL (A) NEGATIVE   Bilirubin Urine NEGATIVE NEGATIVE   Ketones, ur NEGATIVE NEGATIVE mg/dL   Protein, ur NEGATIVE NEGATIVE mg/dL   Nitrite NEGATIVE NEGATIVE   Leukocytes, UA NEGATIVE NEGATIVE   RBC / HPF 0-5 0 - 5 RBC/hpf   WBC, UA 0-5 0 - 5 WBC/hpf   Bacteria, UA RARE (A) NONE SEEN   Squamous Epithelial / LPF 0-5 0 - 5   Mucus PRESENT       Assessment & Plan:   Problem List Items Addressed This Visit      Other  Primary insomnia    Has gotten back  in with psychiatry and they are working with her. Not due for refill on her klonopin- 30 pills called in on 10/25/18. Continue to follow with psychiatry. Call with any concerns.        Other Visit Diagnoses    COPD exacerbation ([place])    -  Primary   No better after neb. Will treat with prednisone and azithromycin. Call if not getting better or getting worse.    Relevant Medications   albuterol (PROVENTIL) (2.5 MG/3ML) 0.083% nebulizer solution 2.5 mg (Completed)   azithromycin (ZITHROMAX) 250 MG tablet   predniSONE (DELTASONE) 50 MG tablet   Wheezing       No better after neb. Will treat with prednisone and azithromycin.    Relevant Medications   albuterol (PROVENTIL) (2.5 MG/3ML) 0.083% nebulizer solution 2.5 mg (Completed)   Left breast abscess       Resolved.        Follow up plan: Return in about 2 weeks (around 11/18/2018) for lung recheck.

## 2018-11-04 NOTE — Telephone Encounter (Signed)
pt called left message that the 2 medication you gave her she can not sleep and when she does sleep she has nightmares.

## 2018-11-04 NOTE — Telephone Encounter (Signed)
Called patient to discuss her concerns with medications. She reports nightmares with remeron and trazodone together. She has not had it just with trazodone which she has taken before. She also did not sleep more than an hour with the two together. Have been on zoloft,cymbalta,effexor xr, lexapro, valium, klonopin, lexparo. Discussed to stop Remeron (Mirtazapine ) due to nightmares. Please increase Trazodone 150 mg at bedtime. Please start Prozac 20 mg .

## 2018-11-04 NOTE — Assessment & Plan Note (Signed)
Has gotten back in with psychiatry and they are working with her. Not due for refill on her klonopin- 30 pills called in on 10/25/18. Continue to follow with psychiatry. Call with any concerns.

## 2018-11-07 ENCOUNTER — Inpatient Hospital Stay (HOSPITAL_BASED_OUTPATIENT_CLINIC_OR_DEPARTMENT_OTHER): Payer: Medicare HMO | Admitting: Internal Medicine

## 2018-11-07 ENCOUNTER — Encounter: Payer: Self-pay | Admitting: Internal Medicine

## 2018-11-07 ENCOUNTER — Inpatient Hospital Stay: Payer: Medicare HMO | Attending: Internal Medicine

## 2018-11-07 VITALS — BP 140/82 | HR 76 | Temp 97.8°F | Resp 16 | Wt 195.6 lb

## 2018-11-07 DIAGNOSIS — Z8521 Personal history of malignant neoplasm of larynx: Secondary | ICD-10-CM | POA: Insufficient documentation

## 2018-11-07 DIAGNOSIS — Z7982 Long term (current) use of aspirin: Secondary | ICD-10-CM

## 2018-11-07 DIAGNOSIS — Z87891 Personal history of nicotine dependence: Secondary | ICD-10-CM

## 2018-11-07 DIAGNOSIS — F419 Anxiety disorder, unspecified: Secondary | ICD-10-CM

## 2018-11-07 DIAGNOSIS — G479 Sleep disorder, unspecified: Secondary | ICD-10-CM | POA: Diagnosis not present

## 2018-11-07 DIAGNOSIS — J40 Bronchitis, not specified as acute or chronic: Secondary | ICD-10-CM | POA: Diagnosis not present

## 2018-11-07 DIAGNOSIS — Z79899 Other long term (current) drug therapy: Secondary | ICD-10-CM | POA: Insufficient documentation

## 2018-11-07 DIAGNOSIS — K5900 Constipation, unspecified: Secondary | ICD-10-CM | POA: Diagnosis not present

## 2018-11-07 DIAGNOSIS — C3431 Malignant neoplasm of lower lobe, right bronchus or lung: Secondary | ICD-10-CM | POA: Insufficient documentation

## 2018-11-07 DIAGNOSIS — Z86010 Personal history of colonic polyps: Secondary | ICD-10-CM

## 2018-11-07 DIAGNOSIS — J449 Chronic obstructive pulmonary disease, unspecified: Secondary | ICD-10-CM | POA: Insufficient documentation

## 2018-11-07 DIAGNOSIS — Z8249 Family history of ischemic heart disease and other diseases of the circulatory system: Secondary | ICD-10-CM | POA: Insufficient documentation

## 2018-11-07 LAB — CBC WITH DIFFERENTIAL/PLATELET
Abs Immature Granulocytes: 0.1 10*3/uL — ABNORMAL HIGH (ref 0.00–0.07)
BASOS ABS: 0 10*3/uL (ref 0.0–0.1)
Basophils Relative: 0 %
Eosinophils Absolute: 0 10*3/uL (ref 0.0–0.5)
Eosinophils Relative: 0 %
HCT: 37.5 % (ref 36.0–46.0)
Hemoglobin: 11.6 g/dL — ABNORMAL LOW (ref 12.0–15.0)
IMMATURE GRANULOCYTES: 1 %
Lymphocytes Relative: 14 %
Lymphs Abs: 1.1 10*3/uL (ref 0.7–4.0)
MCH: 26.9 pg (ref 26.0–34.0)
MCHC: 30.9 g/dL (ref 30.0–36.0)
MCV: 87 fL (ref 80.0–100.0)
Monocytes Absolute: 0.2 10*3/uL (ref 0.1–1.0)
Monocytes Relative: 3 %
Neutro Abs: 6.6 10*3/uL (ref 1.7–7.7)
Neutrophils Relative %: 82 %
Platelets: 253 10*3/uL (ref 150–400)
RBC: 4.31 MIL/uL (ref 3.87–5.11)
RDW: 13.5 % (ref 11.5–15.5)
WBC: 8.1 10*3/uL (ref 4.0–10.5)
nRBC: 0 % (ref 0.0–0.2)

## 2018-11-07 LAB — COMPREHENSIVE METABOLIC PANEL
ALT: 13 U/L (ref 0–44)
AST: 15 U/L (ref 15–41)
Albumin: 3.6 g/dL (ref 3.5–5.0)
Alkaline Phosphatase: 91 U/L (ref 38–126)
Anion gap: 8 (ref 5–15)
BILIRUBIN TOTAL: 0.3 mg/dL (ref 0.3–1.2)
BUN: 20 mg/dL (ref 6–20)
CO2: 25 mmol/L (ref 22–32)
Calcium: 8.7 mg/dL — ABNORMAL LOW (ref 8.9–10.3)
Chloride: 105 mmol/L (ref 98–111)
Creatinine, Ser: 0.8 mg/dL (ref 0.44–1.00)
GFR calc Af Amer: >60 mL/min (ref >60)
GFR calc non Af Amer: >60 mL/min (ref >60)
Glucose, Bld: 118 mg/dL — ABNORMAL HIGH (ref 70–99)
POTASSIUM: 4 mmol/L (ref 3.5–5.1)
Sodium: 138 mmol/L (ref 135–145)
Total Protein: 7.1 g/dL (ref 6.5–8.1)

## 2018-11-07 NOTE — Progress Notes (Signed)
+[name] OFFICE PROGRESS NOTE  Patient Care Team: Valerie Roys, DO as PCP - General (Family Medicine) Anabel Bene, MD as Referring Physician (Neurology) Telford Nab, RN as Registered Nurse  Cancer Staging No matching staging information was found for the patient.   Oncology History   # 2011-laryngeal cancer/SCC; Stage II; April-June 2011 s/p  CRT [Dr.Choski/Bennett]  # 2019- likely STAGE I 10 mm RLL nodule [incidental]; s/p wedge resection-July 2019-stage I squamous cell lung cancer.  Adjuvant therapy.  # COPD  # post thoracotomy syndrome  DIAGNOSIS: RLL lung ca  STAGE:  I       ;GOALS: cure  CURRENT/MOST RECENT THERAPY: surveillaince       Laryngeal cancer (HCC)    Primary cancer of right lower lobe of lung ([place])   05/27/2018 Initial Diagnosis    Primary cancer of right lower lobe of lung ([place])     INTERVAL HISTORY:  Susan Houston 58 y.o.  female pleasant patient with a history of stage I squamous cell lung cancer; and history of laryngeal cancer is here for follow-up.  Patient interim was evaluated the emergency room for worsening shortness of breath cough.  CT chest and pelvis negative for any obvious malignancy.  Noted to have constipation.  Patient complains of pain in the left posterior chest wall pleuritic in nature.;  Diagnosed with bronchitis started on Z-Pak and prednisone by PCP.  Patient continues to complain of significant difficulty sleeping/insomnia.  She is on Seroquel/Klonopin trazodone.  + Patient also complains of constipation alternating with diarrhea.  Last colonoscopy in 2012.  Review of Systems  Constitutional: Positive for malaise/fatigue. Negative for chills, diaphoresis, fever and weight loss.  HENT: Negative for nosebleeds and sore throat.   Eyes: Negative for double vision.  Respiratory: Positive for cough and shortness of breath. Negative for hemoptysis, sputum production and wheezing.   Cardiovascular:  Positive for chest pain. Negative for palpitations, orthopnea and leg swelling.  Gastrointestinal: Positive for constipation and diarrhea. Negative for abdominal pain, blood in stool, heartburn, melena, nausea and vomiting.  Genitourinary: Negative for dysuria, frequency and urgency.  Musculoskeletal: Negative for back pain and joint pain.  Skin: Negative.  Negative for itching and rash.  Neurological: Negative for dizziness, tingling, focal weakness, weakness and headaches.  Endo/Heme/Allergies: Does not bruise/bleed easily.  Psychiatric/Behavioral: Negative for depression. The patient is nervous/anxious and has insomnia.       PAST MEDICAL HISTORY :  Past Medical History:  Diagnosis Date  . Anemia    vitamin b12 deficiency. no longer taking supplements  . Anxiety   . Arthritis   . Asthma   . Back pain   . Bruises easily   . Cancer ([place]) 2009   LARYNX CANCER - CHEMO / RADIATION (NO SURGERY)   . Chronic leg pain    BILATERAL  . COPD (chronic obstructive pulmonary disease) (HCC)    no inhalers for over 1 year  . Depression   . Difficulty sleeping   . Diverticulitis   . Dysrhythmia 04/2018   brady episodes. cleared by dr. Clayborn Bigness  . Facet syndrome, lumbar 03/28/2015  . Hemorrhoids   . History of kidney stones 04/2018   recently passed stone  . Hyperlipidemia   . Hypertension 2019   not on any treatment currently  . Incontinence of urine   . Laryngeal cancer ([place]) 2009  . Lung cancer ([place]) 04/2018   diagnosed via ct scan  . Lung cancer ([place]) 04/18/2018  . Migraine   .  Migraine without aura and without status migrainosus, not intractable 09/11/2016  . Mild cognitive impairment 11/23/2017  . MRSA infection greater than 3 months ago    2008 right side of face  . Nerve damage    right leg. thinks this is from her TKR  . Nodule of lower lobe of right lung 04/22/2018  . Oxygen deficiency    2L/HS  . Wears dentures    full upper and lower    PAST SURGICAL HISTORY :   Past  Surgical History:  Procedure Laterality Date  . ABDOMINAL HYSTERECTOMY  1983  . BLADDER SUSPENSION  2014  . CATARACT EXTRACTION W/PHACO Left 05/11/2018   Procedure: CATARACT EXTRACTION PHACO AND INTRAOCULAR LENS PLACEMENT ([place]) LEFT;  Surgeon: Leandrew Koyanagi, MD;  Location: [facility];  Service: Ophthalmology;  Laterality: Left;  PREFERS EARLY  . CATARACT EXTRACTION W/PHACO Right 08/11/2018   Procedure: CATARACT EXTRACTION PHACO AND INTRAOCULAR LENS PLACEMENT ([facility]);  Surgeon: Leandrew Koyanagi, MD;  Location: [facility];  Service: Ophthalmology;  Laterality: Right;  CDE 5:02 Total Time 01:35 Fluid lot # [id number]  . CHOLECYSTECTOMY  1998  . COLONOSCOPY    . ESOPHAGOGASTRODUODENOSCOPY  2015  . ESOPHAGOGASTRODUODENOSCOPY (EGD) WITH PROPOFOL N/A 11/20/2016   Procedure: ESOPHAGOGASTRODUODENOSCOPY (EGD) WITH PROPOFOL;  Surgeon: Jonathon Bellows, MD;  Location: [facility];  Service: Endoscopy;  Laterality: N/A;  . EXCISION NEUROMA Bilateral 09/24/2014   Procedure: BILATERAL OPEN SAPHENOUS NEURECTOMIES AND OPEN LEFT PERIPATELLA OSETOPHYTECTOMY;  Surgeon: Mauri Pole, MD;  Location: [facility];  Service: Orthopedics;  Laterality: Bilateral;  . EYE SURGERY Left 05/11/2018   cataract extraction  . JOINT REPLACEMENT  2009/2010   BIL TOTAL KNEES  . THORACOTOMY/LOBECTOMY Right 05/16/2018   Procedure: THORACOTOMY/POSSIBLE LOBECTOMY;  Surgeon: Nestor Lewandowsky, MD;  Location: [facility];  Service: Thoracic;  Laterality: Right;  . TONSILLECTOMY    . VIDEO BRONCHOSCOPY N/A 05/16/2018   Procedure: PREOP  BRONCHOSCOPY;  Surgeon: Nestor Lewandowsky, MD;  Location: [facility];  Service: Thoracic;  Laterality: N/A;    FAMILY HISTORY :   Family History  Problem Relation Age of Onset  . Asthma Mother   . Diabetes Mother   . Hyperlipidemia Mother   . Hypertension Mother   . Cirrhosis Mother        Non-alcoholic  . Arthritis Father   . Cancer Father        Bone  . Hyperlipidemia Father   . Hypertension  Father   . Anxiety disorder Sister   . Depression Sister   . Kidney Stones Son   . Stroke Maternal Grandmother   . Breast cancer Maternal Grandmother 55  . Cancer Maternal Grandfather        lung  . Pneumonia Paternal Grandmother   . Alzheimer's disease Paternal Grandfather   . Diabetes Sister   . Gout Sister   . Hypertension Sister   . Alcohol abuse Brother   . Heart disease Brother        massive MI    SOCIAL HISTORY:   Social History   Tobacco Use  . Smoking status: Former Smoker    Packs/day: 0.50    Types: Cigarettes    Last attempt to quit: 04/07/2016    Years since quitting: 2.5  . Smokeless tobacco: Never Used  . Tobacco comment: patient has not smoked x 10 days.06/28/17 chantix  Substance Use Topics  . Alcohol use: No    Alcohol/week: 0.0 standard drinks  . Drug use: No    ALLERGIES:  is  allergic to morphine and related; cymbalta [duloxetine hcl]; adhesive [tape]; and lexapro [escitalopram oxalate].  MEDICATIONS:  Current Outpatient Medications  Medication Sig Dispense Refill  . albuterol (PROAIR HFA) 108 (90 Base) MCG/ACT inhaler INL 2 PFS ITL Q 4 H PRF WHZ OR SOB    . albuterol (PROVENTIL HFA;VENTOLIN HFA) 108 (90 Base) MCG/ACT inhaler Inhale 2 puffs into the lungs every 4 (four) hours as needed for wheezing or shortness of breath. 1 Inhaler 2  . aspirin 81 MG tablet Take 81 mg by mouth daily.    Marland Kitchen azithromycin (ZITHROMAX) 250 MG tablet 2 tabs today then 1 tab daily for 4 days 6 tablet 0  . clonazePAM (KLONOPIN) 1 MG tablet Take 1 tablet (1 mg total) by mouth at bedtime. 30 tablet 0  . FLUoxetine (PROZAC) 20 MG capsule Take 1 capsule (20 mg total) by mouth daily with breakfast. For mood 30 capsule 0  . gabapentin (NEURONTIN) 300 MG capsule Take 1-3 capsules (300-900 mg total) by mouth 4 (four) times daily. Follow the written titration instructions. (Patient taking differently: Take 600 mg by mouth at bedtime. Follow the written titration instructions.) 360  capsule 2  . hydrOXYzine (VISTARIL) 25 MG capsule Take 1 capsule (25 mg total) by mouth 2 (two) times daily as needed. For severe anxiety symptoms 60 capsule 1  . predniSONE (DELTASONE) 50 MG tablet Take 1 tablet (50 mg total) by mouth daily with breakfast. 5 tablet 0  . traZODone (DESYREL) 150 MG tablet Take 1 tablet (150 mg total) by mouth at bedtime. For sleep 30 tablet 1   No current facility-administered medications for this visit.    Facility-Administered Medications Ordered in Other Visits  Medication Dose Route Frequency Provider Last Rate Last Dose  . ipratropium-albuterol (DUONEB) 0.5-2.5 (3) MG/3ML nebulizer solution 3 mL  3 mL Nebulization Q6H Burns, Jennifer E, NP        PHYSICAL EXAMINATION: ECOG PERFORMANCE STATUS: 1 - Symptomatic but completely ambulatory  BP 140/82 (BP Location: Left Arm, Patient Position: Sitting, Cuff Size: Normal)   Pulse 76   Temp 97.8 F (36.6 C) (Tympanic)   Resp 16   Wt 195 lb 9.6 oz (88.7 kg)   BMI 35.78 kg/m   Filed Weights   11/07/18 1357  Weight: 195 lb 9.6 oz (88.7 kg)    Physical Exam  Constitutional: She is oriented to person, place, and time and well-developed, well-nourished, and in no distress.  Accompanied by husband.  She is walking herself.  Not on oxygen.  HENT:  Head: Normocephalic and atraumatic.  Mouth/Throat: Oropharynx is clear and moist. No oropharyngeal exudate.  Eyes: Pupils are equal, round, and reactive to light.  Neck: Normal range of motion. Neck supple.  Cardiovascular: Normal rate and regular rhythm.  Pulmonary/Chest: No respiratory distress. She has no wheezes.  Right chest wall incision wound healing.    Abdominal: Soft. Bowel sounds are normal. She exhibits no distension and no mass. There is no abdominal tenderness. There is no rebound and no guarding.  Musculoskeletal: Normal range of motion.        General: No tenderness or edema.  Neurological: She is alert and oriented to person, place, and time.   Skin: Skin is warm.  Psychiatric: Affect normal.       LABORATORY DATA:  I have reviewed the data as listed    Component Value Date/Time   NA 138 11/07/2018 1328   NA 137 12/03/2017 1447   NA 137 05/14/2014 1457   K  4.0 11/07/2018 1328   K 3.2 (L) 05/14/2014 1457   CL 105 11/07/2018 1328   CL 103 05/14/2014 1457   CO2 25 11/07/2018 1328   CO2 28 05/14/2014 1457   GLUCOSE 118 (H) 11/07/2018 1328   GLUCOSE 80 05/14/2014 1457   BUN 20 11/07/2018 1328   BUN 8 12/03/2017 1447   BUN 8 05/14/2014 1457   CREATININE 0.80 11/07/2018 1328   CREATININE 0.79 05/14/2014 1457   CALCIUM 8.7 (L) 11/07/2018 1328   CALCIUM 8.9 05/14/2014 1457   PROT 7.1 11/07/2018 1328   PROT 6.8 12/03/2017 1447   PROT 7.0 05/14/2014 1457   ALBUMIN 3.6 11/07/2018 1328   ALBUMIN 3.8 12/03/2017 1447   ALBUMIN 3.3 (L) 05/14/2014 1457   AST 15 11/07/2018 1328   AST 9 (L) 05/14/2014 1457   ALT 13 11/07/2018 1328   ALT 15 05/14/2014 1457   ALKPHOS 91 11/07/2018 1328   ALKPHOS 87 05/14/2014 1457   BILITOT 0.3 11/07/2018 1328   BILITOT 0.6 12/03/2017 1447   BILITOT 0.2 05/14/2014 1457   GFRNONAA >60 11/07/2018 1328   GFRNONAA >60 05/14/2014 1457   GFRAA >60 11/07/2018 1328   GFRAA >60 05/14/2014 1457    No results found for: SPEP, UPEP  Lab Results  Component Value Date   WBC 8.1 11/07/2018   NEUTROABS 6.6 11/07/2018   HGB 11.6 (L) 11/07/2018   HCT 37.5 11/07/2018   MCV 87.0 11/07/2018   PLT 253 11/07/2018      Chemistry      Component Value Date/Time   NA 138 11/07/2018 1328   NA 137 12/03/2017 1447   NA 137 05/14/2014 1457   K 4.0 11/07/2018 1328   K 3.2 (L) 05/14/2014 1457   CL 105 11/07/2018 1328   CL 103 05/14/2014 1457   CO2 25 11/07/2018 1328   CO2 28 05/14/2014 1457   BUN 20 11/07/2018 1328   BUN 8 12/03/2017 1447   BUN 8 05/14/2014 1457   CREATININE 0.80 11/07/2018 1328   CREATININE 0.79 05/14/2014 1457      Component Value Date/Time   CALCIUM 8.7 (L) 11/07/2018 1328    CALCIUM 8.9 05/14/2014 1457   ALKPHOS 91 11/07/2018 1328   ALKPHOS 87 05/14/2014 1457   AST 15 11/07/2018 1328   AST 9 (L) 05/14/2014 1457   ALT 13 11/07/2018 1328   ALT 15 05/14/2014 1457   BILITOT 0.3 11/07/2018 1328   BILITOT 0.6 12/03/2017 1447   BILITOT 0.2 05/14/2014 1457       RADIOGRAPHIC STUDIES: I have personally reviewed the radiological images as listed and agreed with the findings in the report. No results found.   ASSESSMENT & PLAN:  Primary cancer of right lower lobe of lung (HCC) #Squamous cell carcinoma of the right lower lobe-status post wedge resection.  Stage I.  Stable  #CT scan January 2020 -negative for recurrence.  We will continue surveillance CT scan in 6 months.  #Stage II laryngeal cancer stable.  Clinically no evidence of disease.  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  S/p nerve ablation with pain physician.  Poor tolerance to Cymbalta.  #Left chest wall pleuritic pain-question pleurisy clinically not suggestive of PE.  Agree with prednisone/antibiotics.  # Constipation: last in 2012-[?polyps]; refer back to GI.   # insominia/anxiety- on trazadone/ seroqul; klonipin/ defer to PCP.   # DISPOSITION:  # referral to Dr.Anna/constipation-colonoscopy #  follow up in 6 months-labs- cbc/cmp; CT chest prior-Dr.B.   Orders Placed This Encounter  Procedures  . CT CHEST W CONTRAST    Standing Status:   Future    Standing Expiration Date:   11/08/2019    Order Specific Question:   If indicated for the ordered procedure, I authorize the administration of contrast media per Radiology protocol    Answer:   Yes    Order Specific Question:   Preferred imaging location?    Answer:   [facility]    Order Specific Question:   Radiology Contrast Protocol - do NOT remove file path    Answer:   [email or web address]    Order Specific Question:   ** REASON FOR EXAM (FREE TEXT)    Answer:   lung cancre    Order  Specific Question:   Is patient pregnant?    Answer:   No   All questions were answered. The patient knows to call the clinic with any problems, questions or concerns.      Cammie Sickle, MD 11/07/2018 2:33 PM

## 2018-11-07 NOTE — Assessment & Plan Note (Addendum)
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  Stage I.  Stable  #CT scan January 2020 -negative for recurrence.  We will continue surveillance CT scan in 6 months.  #Stage II laryngeal cancer stable.  Clinically no evidence of disease.  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  S/p nerve ablation with pain physician.  Poor tolerance to Cymbalta.  #Left chest wall pleuritic pain-question pleurisy clinically not suggestive of PE.  Agree with prednisone/antibiotics.  # Constipation: last in 2012-[?polyps]; refer back to GI.   # insominia/anxiety- on trazadone/ seroqul; klonipin/ defer to PCP.   # DISPOSITION:  # referral to Dr.Anna/constipation-colonoscopy #  follow up in 6 months-labs- cbc/cmp; CT chest prior-Dr.B.

## 2018-11-08 ENCOUNTER — Telehealth: Payer: Self-pay

## 2018-11-08 MED ORDER — SUVOREXANT 5 MG PO TABS: 5.0000 mg | ORAL_TABLET | Freq: Every day | ORAL | 0 refills | Status: DC

## 2018-11-08 NOTE — Telephone Encounter (Signed)
Called patient, discussed to stop trazodone, start Belsomra

## 2018-11-08 NOTE — Telephone Encounter (Signed)
pt called left message that she has not been able to sleep since sunday.

## 2018-11-09 ENCOUNTER — Ambulatory Visit: Payer: Medicare HMO | Attending: Pain Medicine | Admitting: Pain Medicine

## 2018-11-09 ENCOUNTER — Encounter: Payer: Self-pay | Admitting: Pain Medicine

## 2018-11-09 ENCOUNTER — Other Ambulatory Visit: Payer: Self-pay

## 2018-11-09 VITALS — BP 126/71 | HR 77 | Temp 98.2°F | Resp 18 | Ht 62.0 in | Wt 195.0 lb

## 2018-11-09 DIAGNOSIS — G8922 Chronic post-thoracotomy pain: Secondary | ICD-10-CM | POA: Insufficient documentation

## 2018-11-09 DIAGNOSIS — R0782 Intercostal pain: Secondary | ICD-10-CM

## 2018-11-09 DIAGNOSIS — M940 Chondrocostal junction syndrome [Tietze]: Secondary | ICD-10-CM | POA: Insufficient documentation

## 2018-11-09 DIAGNOSIS — R0781 Pleurodynia: Secondary | ICD-10-CM | POA: Insufficient documentation

## 2018-11-09 DIAGNOSIS — M792 Neuralgia and neuritis, unspecified: Secondary | ICD-10-CM

## 2018-11-09 MED ORDER — GABAPENTIN 300 MG PO CAPS: 300.0000 mg | ORAL_CAPSULE | Freq: Four times a day (QID) | ORAL | 2 refills | Status: DC

## 2018-11-09 NOTE — Progress Notes (Signed)
Patient's Name: Susan [place]  MRN: [id number]  Referring Provider: Valerie Roys, DO  DOB: 1961/02/18  PCP: Susan Roys, DO  DOS: 11/09/2018  Note by: Susan [place]  Service setting: Ambulatory outpatient  Specialty: Interventional Pain Management  Location: [facility] (AMB) Pain Management Facility    Patient type: Established   Primary Reason(s) for Visit: Encounter for post-procedure evaluation of chronic illness with mild to moderate exacerbation CC: Pain (Ribcage Right side, Sternum)  HPI  Susan [place] is a 58 y.o. year old, female patient, who comes today for a post-procedure evaluation. She has Saphenous neuralgia; DDD (degenerative disc disease), lumbosacral; Sacroiliac joint dysfunction; DDD (degenerative disc disease), lumbar; Lumbar facet syndrome; Severe depression ([place]); Chronic anxiety; Coarse tremors; Hyperlipidemia; COPD (chronic obstructive pulmonary disease) ([place]); Migraine without aura and without status migrainosus, not intractable; Frequent falls; Mild cognitive impairment; Vitamin B12 deficiency; Laryngeal cancer ([place]); Mass of lower lobe of right lung; Lung cancer ([place]); Primary cancer of right lower lobe of lung ([place]); Controlled substance agreement signed; Rib pain (Primary Area of Pain) (Right); Incisional pain (Secondary Area of Pain); Chronic pain syndrome; Long term current use of opiate analgesic; Pharmacologic therapy; Disorder of skeletal system; Problems influencing health status; Post-thoracotomy pain syndrome; Chronic post-thoracotomy pain; Neurogenic pain; Vitamin D insufficiency; Chronic chest wall pain following surgery (Right); Intercostal pain (Right); Intercostal neuralgia; Primary insomnia; and Costochondritis on their problem list. Her primarily concern today is the Pain (Ribcage Right side, Sternum)  Pain Assessment: Location: Right Rib cage Radiating: Ribcage, Sternum Onset: In the past 7 days Duration: Acute pain Quality: Sharp, Throbbing, Constant,  Nagging Severity: 8 /10 (subjective, self-reported pain score)  Note: Reported level is inconsistent with clinical observations. Clinically the patient looks like a 3/10 A 3/10 is viewed as "Moderate" and described as significantly interfering with activities of daily living (ADL). It becomes difficult to feed, bathe, get dressed, get on and off the toilet or to perform personal hygiene functions. Difficult to get in and out of bed or a chair without assistance. Very distracting. With effort, it can be ignored when deeply involved in activities. Information on the proper use of the pain scale provided to the patient today. When using our objective Pain Scale, levels between 6 and 10/10 are said to belong in an emergency room, as it progressively worsens from a 6/10, described as severely limiting, requiring emergency care not usually available at an outpatient pain management facility. At a 6/10 level, communication becomes difficult and requires great effort. Assistance to reach the emergency department may be required. Facial flushing and profuse sweating along with potentially dangerous increases in heart rate and blood pressure will be evident. Effect on ADL: very limited Timing: Constant Modifying factors: nothing but procedures BP: 126/71  HR: 77  Susan [place] comes in today for post-procedure evaluation.  Today I spoke to the patient again about the alternative of radiofrequency ablation.  Unfortunately, she apparently had a bad experience with Dr. Primus Bravo and does not really trust RFA.  Today I spoke to the patient about the risk of pneumothorax, every time we do intercostal nerve blocks, but she still wants to go ahead and have it repeated.  Further details on both, my assessment(s), as well as the proposed treatment plan, please see below.  Post-Procedure Assessment  10/27/2018 Procedure: Therapeutic right T5, T6, T7 intercostal nerve BLK #3+ diagnostic costochondral injection #1, under fluoroscopic  guidance and IV sedation Pre-procedure pain score:  6/10 Post-procedure pain score: 0/10 (100% relief)  Influential Factors: BMI: 35.67 kg/m Intra-procedural challenges: None observed.         Assessment challenges: None detected.              Reported side-effects: None.        Post-procedural adverse reactions or complications: None reported         Sedation: Sedation provided. When no sedatives are used, the analgesic levels obtained are directly associated to the effectiveness of the local anesthetics. However, when sedation is provided, the level of analgesia obtained during the initial 1 hour following the intervention, is believed to be the result of a combination of factors. These factors may include, but are not limited to: 1. The effectiveness of the local anesthetics used. 2. The effects of the analgesic(s) and/or anxiolytic(s) used. 3. The degree of discomfort experienced by the patient at the time of the procedure. 4. The patients ability and reliability in recalling and recording the events. 5. The presence and influence of possible secondary gains and/or psychosocial factors. Reported result: Relief experienced during the 1st hour after the procedure: 100 % (Ultra-Short Term Relief)            Interpretative annotation: Clinically appropriate result. Analgesia during this period is likely to be Local Anesthetic and/or IV Sedative (Analgesic/Anxiolytic) related.          Effects of local anesthetic: The analgesic effects attained during this period are directly associated to the localized infiltration of local anesthetics and therefore cary significant diagnostic value as to the etiological location, or anatomical origin, of the pain. Expected duration of relief is directly dependent on the pharmacodynamics of the local anesthetic used. Long-acting (4-6 hours) anesthetics used.  Reported result: Relief during the next 4 to 6 hour after the procedure: 100 % (Short-Term Relief)             Interpretative annotation: Clinically appropriate result. Analgesia during this period is likely to be Local Anesthetic-related.          Long-term benefit: Defined as the period of time past the expected duration of local anesthetics (1 hour for short-acting and 4-6 hours for long-acting). With the possible exception of prolonged sympathetic blockade from the local anesthetics, benefits during this period are typically attributed to, or associated with, other factors such as analgesic sensory neuropraxia, antiinflammatory effects, or beneficial biochemical changes provided by agents other than the local anesthetics.  Reported result: Extended relief following procedure: 50 % (Long-Term Relief)            Interpretative annotation: Clinically possible results. Good relief. No permanent benefit expected. Inflammation plays a part in the etiology to the pain.          Current benefits: Defined as reported results that persistent at this point in time.   Analgesia: 50 %            Function: Somewhat improved ROM: Somewhat improved Interpretative annotation: Ongoing benefit. Therapeutic benefit observed. Results would suggest persistent aggravating factors.          Interpretation: Results would suggest a successful diagnostic intervention.         Because of the type of pain that were dealing with, the patient is not a really a good candidate for physical therapy.  Plan:  Proceed with Radiofrequency Ablation for the purpose of attaining long-term benefits.       "The patient has failed to respond to conservative therapies including over-the-counter medications, anti-inflammatories, muscle relaxants, membrane stabilizers, opioids, physical therapy modalities such  as heat and ice, as well as more invasive techniques such as nerve blocks. Because Ms. Stobaugh did attain more than 50% relief of the pain during a series of diagnostic blocks conducted in separate occasions, I believe it is medically necessary  to proceed with Radiofrequency Ablation, in order to attempt gaining longer relief.    Laboratory Chemistry  Inflammation Markers (CRP: Acute Phase) (ESR: Chronic Phase) Lab Results  Component Value Date   CRP 6 08/18/2018   ESRSEDRATE 45 (H) 08/18/2018   LATICACIDVEN 1.8 09/11/2018                         Rheumatology Markers No results found.  Renal Markers Lab Results  Component Value Date   BUN 20 11/07/2018   CREATININE 0.80 11/07/2018   BCR 9 12/03/2017   GFRAA >60 11/07/2018   GFRNONAA >60 11/07/2018                             Hepatic Markers Lab Results  Component Value Date   AST 15 11/07/2018   ALT 13 11/07/2018   ALBUMIN 3.6 11/07/2018                        Neuropathy Markers Lab Results  Component Value Date   VITAMINB12 405 08/18/2018                        Hematology Parameters Lab Results  Component Value Date   INR 1.13 05/12/2018   LABPROT 14.4 05/12/2018   APTT 32 05/12/2018   PLT 253 11/07/2018   HGB 11.6 (L) 11/07/2018   HCT 37.5 11/07/2018                        CV Markers Lab Results  Component Value Date   CKTOTAL 48 09/11/2018   TROPONINI <0.03 03/02/2018                         Note: Lab results reviewed.  Recent Imaging Results   Results for orders placed in visit on 10/27/18  DG C-Arm 1-60 Min-No Report   Narrative Fluoroscopy was utilized by the requesting physician.  No radiographic  interpretation.    Interpretation Report: Fluoroscopy was used during the procedure to assist with needle guidance. The images were interpreted intraoperatively by the requesting physician.  Meds   Current Outpatient Medications:  .  albuterol (PROVENTIL HFA;VENTOLIN HFA) 108 (90 Base) MCG/ACT inhaler, Inhale 2 puffs into the lungs every 4 (four) hours as needed for wheezing or shortness of breath., Disp: 1 Inhaler, Rfl: 2 .  aspirin 81 MG tablet, Take 81 mg by mouth daily., Disp: , Rfl:  .  azithromycin (ZITHROMAX) 250 MG tablet, 2  tabs today then 1 tab daily for 4 days, Disp: 6 tablet, Rfl: 0 .  clonazePAM (KLONOPIN) 1 MG tablet, Take 1 tablet (1 mg total) by mouth at bedtime., Disp: 30 tablet, Rfl: 0 .  FLUoxetine (PROZAC) 20 MG capsule, Take 1 capsule (20 mg total) by mouth daily with breakfast. For mood, Disp: 30 capsule, Rfl: 0 .  gabapentin (NEURONTIN) 300 MG capsule, Take 1-3 capsules (300-900 mg total) by mouth 4 (four) times daily. Follow the written titration instructions., Disp: 360 capsule, Rfl: 2 .  hydrOXYzine (VISTARIL) 25 MG capsule, Take 1 capsule (25  mg total) by mouth 2 (two) times daily as needed. For severe anxiety symptoms, Disp: 60 capsule, Rfl: 1 .  predniSONE (DELTASONE) 50 MG tablet, Take 1 tablet (50 mg total) by mouth daily with breakfast., Disp: 5 tablet, Rfl: 0 .  Suvorexant (BELSOMRA) 5 MG TABS, Take 5 mg by mouth at bedtime., Disp: 10 tablet, Rfl: 0 No current facility-administered medications for this visit.   Facility-Administered Medications Ordered in Other Visits:  .  ipratropium-albuterol (DUONEB) 0.5-2.5 (3) MG/3ML nebulizer solution 3 mL, 3 mL, Nebulization, Q6H, Burns, Jennifer E, NP  ROS  Constitutional: Denies any fever or chills Gastrointestinal: No reported hemesis, hematochezia, vomiting, or acute GI distress Musculoskeletal: Denies any acute onset joint swelling, redness, loss of ROM, or weakness Neurological: No reported episodes of acute onset apraxia, aphasia, dysarthria, agnosia, amnesia, paralysis, loss of coordination, or loss of consciousness  Allergies  Ms. Hebenstreit is allergic to morphine and related; cymbalta [duloxetine hcl]; adhesive [tape]; and lexapro [escitalopram oxalate].  [place]  Drug: Ms. Viscuso  reports no history of drug use. Alcohol:  reports no history of alcohol use. Tobacco:  reports that she quit smoking about 2 years ago. Her smoking use included cigarettes. She smoked 0.50 packs per day. She has never used smokeless tobacco. Medical:  has a past medical  history of Anemia, Anxiety, Arthritis, Asthma, Back pain, Bruises easily, Cancer ([place]) (2009), Chronic leg pain, COPD (chronic obstructive pulmonary disease) ([place]), Depression, Difficulty sleeping, Diverticulitis, Dysrhythmia (04/2018), Facet syndrome, lumbar (03/28/2015), Hemorrhoids, History of kidney stones (04/2018), Hyperlipidemia, Hypertension (2019), Incontinence of urine, Laryngeal cancer ([place]) (2009), Lung cancer ([place]) (04/2018), Lung cancer ([place]) (04/18/2018), Migraine, Migraine without aura and without status migrainosus, not intractable (09/11/2016), Mild cognitive impairment (11/23/2017), MRSA infection greater than 3 months ago, Nerve damage, Nodule of lower lobe of right lung (04/22/2018), Oxygen deficiency, and Wears dentures. Surgical: Ms. Gato  has a past surgical history that includes Bladder suspension (2014); Tonsillectomy; Excision neuroma (Bilateral, 09/24/2014); Esophagogastroduodenoscopy (2015); Colonoscopy; Esophagogastroduodenoscopy (egd) with propofol (N/A, 11/20/2016); Joint replacement (2009/2010); Abdominal hysterectomy (1983); Eye surgery (Left, 05/11/2018); Cholecystectomy (1998); Cataract extraction w/PHACO (Left, 05/11/2018); Thoracotomy/lobectomy (Right, 05/16/2018); Video bronchoscopy (N/A, 05/16/2018); and Cataract extraction w/PHACO (Right, 08/11/2018). Family: family history includes Alcohol abuse in her brother; Alzheimer's disease in her paternal grandfather; Anxiety disorder in her sister; Arthritis in her father; Asthma in her mother; Breast cancer (age of onset: 49) in her maternal grandmother; Cancer in her father and maternal grandfather; Cirrhosis in her mother; Depression in her sister; Diabetes in her mother and sister; Gout in her sister; Heart disease in her brother; Hyperlipidemia in her father and mother; Hypertension in her father, mother, and sister; Kidney Stones in her son; Pneumonia in her paternal grandmother; Stroke in her maternal grandmother.  Constitutional  Exam  General appearance: Well nourished, well developed, and well hydrated. In no apparent acute distress Vitals:   11/09/18 1316  BP: 126/71  Pulse: 77  Resp: 18  Temp: 98.2 F (36.8 C)  TempSrc: Axillary  SpO2: 96%  Weight: 195 lb (88.5 kg)  Height: 5' 2" (1.575 m)   BMI Assessment: Estimated body mass index is 35.67 kg/m as calculated from the following:   Height as of this encounter: 5' 2" (1.575 m).   Weight as of this encounter: 195 lb (88.5 kg).  BMI interpretation table: BMI level Category Range association with higher incidence of chronic pain  <18 kg/m2 Underweight   18.5-24.9 kg/m2 Ideal body weight   25-29.9 kg/m2 Overweight Increased incidence by  20%  30-34.9 kg/m2 Obese (Class I) Increased incidence by 68%  35-39.9 kg/m2 Severe obesity (Class II) Increased incidence by 136%  >40 kg/m2 Extreme obesity (Class III) Increased incidence by 254%   Patient's current BMI Ideal Body weight  Body mass index is 35.67 kg/m. Ideal body weight: 50.1 kg (110 lb 7.2 oz) Adjusted ideal body weight: 65.4 kg (144 lb 4.3 oz)   BMI Readings from Last 4 Encounters:  11/09/18 35.67 kg/m  11/07/18 35.78 kg/m  11/04/18 35.30 kg/m  10/28/18 35.30 kg/m   Wt Readings from Last 4 Encounters:  11/09/18 195 lb (88.5 kg)  11/07/18 195 lb 9.6 oz (88.7 kg)  11/04/18 193 lb (87.5 kg)  10/28/18 193 lb (87.5 kg)  Psych/Mental status: Alert, oriented x 3 (person, place, & time)       Eyes: PERLA Respiratory: No evidence of acute respiratory distress  Cervical Spine Area Exam  Skin & Axial Inspection: No masses, redness, edema, swelling, or associated skin lesions Alignment: Symmetrical Functional ROM: Unrestricted ROM      Stability: No instability detected Muscle Tone/Strength: Functionally intact. No obvious neuro-muscular anomalies detected. Sensory (Neurological): Unimpaired Palpation: No palpable anomalies              Upper Extremity (UE) Exam    Side: Right upper  extremity  Side: Left upper extremity  Skin & Extremity Inspection: Skin color, temperature, and hair growth are WNL. No peripheral edema or cyanosis. No masses, redness, swelling, asymmetry, or associated skin lesions. No contractures.  Skin & Extremity Inspection: Skin color, temperature, and hair growth are WNL. No peripheral edema or cyanosis. No masses, redness, swelling, asymmetry, or associated skin lesions. No contractures.  Functional ROM: Unrestricted ROM          Functional ROM: Unrestricted ROM          Muscle Tone/Strength: Functionally intact. No obvious neuro-muscular anomalies detected.  Muscle Tone/Strength: Functionally intact. No obvious neuro-muscular anomalies detected.  Sensory (Neurological): Unimpaired          Sensory (Neurological): Unimpaired          Palpation: No palpable anomalies              Palpation: No palpable anomalies              Provocative Test(s):  Phalen's test: deferred Tinel's test: deferred Apley's scratch test (touch opposite shoulder):  Action 1 (Across chest): deferred Action 2 (Overhead): deferred Action 3 (LB reach): deferred   Provocative Test(s):  Phalen's test: deferred Tinel's test: deferred Apley's scratch test (touch opposite shoulder):  Action 1 (Across chest): deferred Action 2 (Overhead): deferred Action 3 (LB reach): deferred    Thoracic Spine Area Exam  Skin & Axial Inspection: No masses, redness, or swelling Alignment: Symmetrical Functional ROM: Unrestricted ROM Stability: No instability detected Muscle Tone/Strength: Functionally intact. No obvious neuro-muscular anomalies detected. Sensory (Neurological): Unimpaired Muscle strength & Tone: No palpable anomalies  Lumbar Spine Area Exam  Skin & Axial Inspection: No masses, redness, or swelling Alignment: Symmetrical Functional ROM: Unrestricted ROM       Stability: No instability detected Muscle Tone/Strength: Functionally intact. No obvious neuro-muscular anomalies  detected. Sensory (Neurological): Unimpaired Palpation: No palpable anomalies       Provocative Tests: Hyperextension/rotation test: deferred today       Lumbar quadrant test (Kemp's test): deferred today       Lateral bending test: deferred today       Patrick's Maneuver: deferred today  FABER* test: deferred today                   S-I anterior distraction/compression test: deferred today         S-I lateral compression test: deferred today         S-I Thigh-thrust test: deferred today         S-I Gaenslen's test: deferred today         *(Flexion, ABduction and External Rotation)  Gait & Posture Assessment  Ambulation: Unassisted Gait: Relatively normal for age and body habitus Posture: WNL   Lower Extremity Exam    Side: Right lower extremity  Side: Left lower extremity  Stability: No instability observed          Stability: No instability observed          Skin & Extremity Inspection: Skin color, temperature, and hair growth are WNL. No peripheral edema or cyanosis. No masses, redness, swelling, asymmetry, or associated skin lesions. No contractures.  Skin & Extremity Inspection: Skin color, temperature, and hair growth are WNL. No peripheral edema or cyanosis. No masses, redness, swelling, asymmetry, or associated skin lesions. No contractures.  Functional ROM: Unrestricted ROM                  Functional ROM: Unrestricted ROM                  Muscle Tone/Strength: Functionally intact. No obvious neuro-muscular anomalies detected.  Muscle Tone/Strength: Functionally intact. No obvious neuro-muscular anomalies detected.  Sensory (Neurological): Unimpaired        Sensory (Neurological): Unimpaired        DTR: Patellar: deferred today Achilles: deferred today Plantar: deferred today  DTR: Patellar: deferred today Achilles: deferred today Plantar: deferred today  Palpation: No palpable anomalies  Palpation: No palpable anomalies   Assessment  Primary Diagnosis  & Pertinent Problem List: The primary encounter diagnosis was Rib pain (Primary Area of Pain) (Right). Diagnoses of Intercostal pain (Right), Costochondritis, Chronic post-thoracotomy pain, and Neurogenic pain were also pertinent to this visit.  Status Diagnosis  Recurring Persistent Improving 1. Rib pain (Primary Area of Pain) (Right)   2. Intercostal pain (Right)   3. Costochondritis   4. Chronic post-thoracotomy pain   5. Neurogenic pain     Problems updated and reviewed during this visit: No problems updated. Plan of Care  Pharmacotherapy (Medications Ordered): Meds ordered this encounter  Medications  . gabapentin (NEURONTIN) 300 MG capsule    Sig: Take 1-3 capsules (300-900 mg total) by mouth 4 (four) times daily. Follow the written titration instructions.    Dispense:  360 capsule    Refill:  2    Do not place medication on "Automatic Refill". Fill one day early if pharmacy is closed on scheduled refill date.   Medications administered today: Ariadne Rissmiller. [place] had no medications administered during this visit.   Procedure Orders     INTERCOSTAL NERVE BLOCK     INTERCOSTAL NERVE BLOCK     Injection tendon or ligament Lab Orders  No laboratory test(s) ordered today   Imaging Orders  No imaging studies ordered today   Referral Orders  No referral(s) requested today   Interventional management options: Planned, scheduled, and/or pending:   Therapeutic right T5, T6, T7 intercostal nerve BLK #4+ diagnostic costochondral injection #2, under fluoroscopic guidance and IV sedation   Considering:   Diagnostic rightT5, T6, T7intercostal nerve block #4 Possible rightT5, T6, T7intercostalnerve RFA   Palliative PRN  treatment(s):   Palliative rightT5, T6, T7intercostal nerve blocks (under fluoroscopic guidance and IV sedation)   Provider-requested follow-up: Return for Procedure (w/ sedation): (R) Intercostal NB #4 + (R) Chostocondral inj. #2.  Future Appointments   Date Time Provider [place]  11/11/2018 11:15 AM Ursula Alert, [place] [facility] None  11/15/2018  8:30 AM Milinda Pointer, [place] [facility] None  11/17/2018 11:00 AM Alden Hipp, LCSW [facility] None  11/21/2018  3:30 PM Susan Roys, DO [facility]  12/06/2018  1:30 PM Jonathon Bellows, [place] [facility] None  05/08/2019  9:30 AM [facility]  05/09/2019  2:00 PM [facility] None  05/09/2019  2:30 PM Cammie Sickle, [place] [facility] None   Primary Care Physician: Susan Roys, DO Location: [facility] Note by: Susan [place] Date: 11/09/2018; Time: 2:14 PM

## 2018-11-09 NOTE — Patient Instructions (Addendum)
____________________________________________________________________________________________  Preparing for Procedure with Sedation  Instructions: . Oral Intake: Do not eat or drink anything for at least 8 hours prior to your procedure. . Transportation: Public transportation is not allowed. Bring an adult driver. The driver must be physically present in our waiting room before any procedure can be started. Marland Kitchen Physical Assistance: Bring an adult physically capable of assisting you, in the event you need help. This adult should keep you company at home for at least 6 hours after the procedure. . Blood Pressure Medicine: Take your blood pressure medicine with a sip of water the morning of the procedure. . Blood thinners: Notify our staff if you are taking any blood thinners. Depending on which one you take, there will be specific instructions on how and when to stop it. . Diabetics on insulin: Notify the staff so that you can be scheduled 1st case in the morning. If your diabetes requires high dose insulin, take only  of your normal insulin dose the morning of the procedure and notify the staff that you have done so. . Preventing infections: Shower with an antibacterial soap the morning of your procedure. . Build-up your immune system: Take 1000 mg of Vitamin C with every meal (3 times a day) the day prior to your procedure. Marland Kitchen Antibiotics: Inform the staff if you have a condition or reason that requires you to take antibiotics before dental procedures. . Pregnancy: If you are pregnant, call and cancel the procedure. . Sickness: If you have a cold, fever, or any active infections, call and cancel the procedure. . Arrival: You must be in the facility at least 30 minutes prior to your scheduled procedure. . Children: Do not bring children with you. . Dress appropriately: Bring dark clothing that you would not mind if they get stained. . Valuables: Do not bring any jewelry or valuables.  Procedure  appointments are reserved for interventional treatments only. Marland Kitchen No Prescription Refills. . No medication changes will be discussed during procedure appointments. . No disability issues will be discussed.  Reasons to call and reschedule or cancel your procedure: (Following these recommendations will minimize the risk of a serious complication.) . Surgeries: Avoid having procedures within 2 weeks of any surgery. (Avoid for 2 weeks before or after any surgery). . Flu Shots: Avoid having procedures within 2 weeks of a flu shots or . (Avoid for 2 weeks before or after immunizations). . Barium: Avoid having a procedure within 7-10 days after having had a radiological study involving the use of radiological contrast. (Myelograms, Barium swallow or enema study). . Heart attacks: Avoid any elective procedures or surgeries for the initial 6 months after a "Myocardial Infarction" (Heart Attack). . Blood thinners: It is imperative that you stop these medications before procedures. Let us know if you if you take any blood thinner.  . Infection: Avoid procedures during or within two weeks of an infection (including chest colds or gastrointestinal problems). Symptoms associated with infections include: Localized redness, fever, chills, night sweats or profuse sweating, burning sensation when voiding, cough, congestion, stuffiness, runny nose, sore throat, diarrhea, nausea, vomiting, cold or Flu symptoms, recent or current infections. It is specially important if the infection is over the area that we intend to treat. Marland Kitchen Heart and lung problems: Symptoms that may suggest an active cardiopulmonary problem include: cough, chest pain, breathing difficulties or shortness of breath, dizziness, ankle swelling, uncontrolled high or unusually low blood pressure, and/or palpitations. If you are experiencing any of these symptoms, cancel  your procedure and contact your primary care physician for an evaluation.  Remember:   Regular Business hours are:  Monday to Thursday 8:00 AM to 4:00 PM  Provider's Schedule: Milinda Pointer, MD:  Procedure days: Tuesday and Thursday 7:30 AM to 4:00 PM  Gillis Santa, MD:  Procedure days: Monday and Wednesday 7:30 AM to 4:00 PM ____________________________________________________________________________________________    ______________________________________________________________________________________________  Specialty Pain Scale  Introduction:  There are significant differences in how pain is reported. The word pain usually refers to physical pain, but it is also a common synonym of suffering. The medical community uses a scale from 0 (zero) to 10 (ten) to report pain level. Zero (0) is described as "no pain", while ten (10) is described as "the worse pain you can imagine". The problem with this scale is that physical pain is reported along with suffering. Suffering refers to mental pain, or more often yet it refers to any unpleasant feeling, emotion or aversion associated with the perception of harm or threat of harm. It is the psychological component of pain.  Pain Specialists prefer to separate the two components. The pain scale used by this practice is the Verbal Numerical Rating Scale (VNRS-11). This scale is for the physical pain only. DO NOT INCLUDE how your pain psychologically affects you. This scale is for adults 59 years of age and older. It has 11 (eleven) levels. The 1st level is 0/10. This means: "right now, I have no pain". In the context of pain management, it also means: "right now, my physical pain is under control with the current therapy".  General Information:  The scale should reflect your current level of pain. Unless you are specifically asked for the level of your worst pain, or your average pain. If you are asked for one of these two, then it should be understood that it is over the past 24 hours.  Levels 1 (one) through 5 (five) are  described below, and can be treated as an outpatient. Ambulatory pain management facilities such as ours are more than adequate to treat these levels. Levels 6 (six) through 10 (ten) are also described below, however, these must be treated as a hospitalized patient. While levels 6 (six) and 7 (seven) may be evaluated at an urgent care facility, levels 8 (eight) through 10 (ten) constitute medical emergencies and as such, they belong in a hospital's emergency department. When having these levels (as described below), do not come to our office. Our facility is not equipped to manage these levels. Go directly to an urgent care facility or an emergency department to be evaluated.  Definitions:  Activities of Daily Living (ADL): Activities of daily living (ADL or ADLs) is a term used in healthcare to refer to people's daily self-care activities. Health professionals often use a person's ability or inability to perform ADLs as a measurement of their functional status, particularly in regard to people post injury, with disabilities and the elderly. There are two ADL levels: Basic and Instrumental. Basic Activities of Daily Living (BADL  or BADLs) consist of self-care tasks that include: Bathing and showering; personal hygiene and grooming (including brushing/combing/styling hair); dressing; Toilet hygiene (getting to the toilet, cleaning oneself, and getting back up); eating and self-feeding (not including cooking or chewing and swallowing); functional mobility, often referred to as "transferring", as measured by the ability to walk, get in and out of bed, and get into and out of a chair; the broader definition (moving from one place to another while performing activities)  is useful for people with different physical abilities who are still able to get around independently. Basic ADLs include the things many people do when they get up in the morning and get ready to go out of the house: get out of bed, go to the  toilet, bathe, dress, groom, and eat. On the average, loss of function typically follows a particular order. Hygiene is the first to go, followed by loss of toilet use and locomotion. The last to go is the ability to eat. When there is only one remaining area in which the person is independent, there is a 62.9% chance that it is eating and only a 3.5% chance that it is hygiene. Instrumental Activities of Daily Living (IADL or IADLs) are not necessary for fundamental functioning, but they let an individual live independently in a community. IADL consist of tasks that include: cleaning and maintaining the house; home establishment and maintenance; care of others (including selecting and supervising caregivers); care of pets; child rearing; managing money; managing financials (investments, etc.); meal preparation and cleanup; shopping for groceries and necessities; moving within the community; safety procedures and emergency responses; health management and maintenance (taking prescribed medications); and using the telephone or other form of communication.  Instructions:  Most patients tend to report their pain as a combination of two factors, their physical pain and their psychosocial pain. This last one is also known as "suffering" and it is reflection of how physical pain affects you socially and psychologically. From now on, report them separately.  From this point on, when asked to report your pain level, report only your physical pain. Use the following table for reference.  Pain Clinic Pain Levels (0-5/10)  Pain Level Score  Description  No Pain 0   Mild pain 1 Nagging, annoying, but does not interfere with basic activities of daily living (ADL). Patients are able to eat, bathe, get dressed, toileting (being able to get on and off the toilet and perform personal hygiene functions), transfer (move in and out of bed or a chair without assistance), and maintain continence (able to control bladder and  bowel functions). Blood pressure and heart rate are unaffected. A normal heart rate for a healthy adult ranges from 60 to 100 bpm (beats per minute).   Mild to moderate pain 2 Noticeable and distracting. Impossible to hide from other people. More frequent flare-ups. Still possible to adapt and function close to normal. It can be very annoying and may have occasional stronger flare-ups. With discipline, patients may get used to it and adapt.   Moderate pain 3 Interferes significantly with activities of daily living (ADL). It becomes difficult to feed, bathe, get dressed, get on and off the toilet or to perform personal hygiene functions. Difficult to get in and out of bed or a chair without assistance. Very distracting. With effort, it can be ignored when deeply involved in activities.   Moderately severe pain 4 Impossible to ignore for more than a few minutes. With effort, patients may still be able to manage work or participate in some social activities. Very difficult to concentrate. Signs of autonomic nervous system discharge are evident: dilated pupils (mydriasis); mild sweating (diaphoresis); sleep interference. Heart rate becomes elevated (>115 bpm). Diastolic blood pressure (lower number) rises above 100 mmHg. Patients find relief in laying down and not moving.   Severe pain 5 Intense and extremely unpleasant. Associated with frowning face and frequent crying. Pain overwhelms the senses.  Ability to do any activity or maintain social  relationships becomes significantly limited. Conversation becomes difficult. Pacing back and forth is common, as getting into a comfortable position is nearly impossible. Pain wakes you up from deep sleep. Physical signs will be obvious: pupillary dilation; increased sweating; goosebumps; brisk reflexes; cold, clammy hands and feet; nausea, vomiting or dry heaves; loss of appetite; significant sleep disturbance with inability to fall asleep or to remain asleep. When  persistent, significant weight loss is observed due to the complete loss of appetite and sleep deprivation.  Blood pressure and heart rate becomes significantly elevated. Caution: If elevated blood pressure triggers a pounding headache associated with blurred vision, then the patient should immediately seek attention at an urgent or emergency care unit, as these may be signs of an impending stroke.    Emergency Department Pain Levels (6-10/10)  Emergency Room Pain 6 Severely limiting. Requires emergency care and should not be seen or managed at an outpatient pain management facility. Communication becomes difficult and requires great effort. Assistance to reach the emergency department may be required. Facial flushing and profuse sweating along with potentially dangerous increases in heart rate and blood pressure will be evident.   Distressing pain 7 Self-care is very difficult. Assistance is required to transport, or use restroom. Assistance to reach the emergency department will be required. Tasks requiring coordination, such as bathing and getting dressed become very difficult.   Disabling pain 8 Self-care is no longer possible. At this level, pain is disabling. The individual is unable to do even the most "basic" activities such as walking, eating, bathing, dressing, transferring to a bed, or toileting. Fine motor skills are lost. It is difficult to think clearly.   Incapacitating pain 9 Pain becomes incapacitating. Thought processing is no longer possible. Difficult to remember your own name. Control of movement and coordination are lost.   The worst pain imaginable 10 At this level, most patients pass out from pain. When this level is reached, collapse of the autonomic nervous system occurs, leading to a sudden drop in blood pressure and heart rate. This in turn results in a temporary and dramatic drop in blood flow to the brain, leading to a loss of consciousness. Fainting is one of the body's  self defense mechanisms. Passing out puts the brain in a calmed state and causes it to shut down for a while, in order to begin the healing process.    Summary: 1. Refer to this scale when providing [place] with your pain level. 2. Be accurate and careful when reporting your pain level. This will help with your care. 3. Over-reporting your pain level will lead to loss of credibility. 4. Even a level of 1/10 means that there is pain and will be treated at our facility. 5. High, inaccurate reporting will be documented as "Symptom Exaggeration", leading to loss of credibility and suspicions of possible secondary gains such as obtaining more narcotics, or wanting to appear disabled, for fraudulent reasons. 6. Only pain levels of 5 or below will be seen at our facility. 7. Pain levels of 6 and above will be sent to the Emergency Department and the appointment cancelled. ______________________________________________________________________________________________

## 2018-11-09 NOTE — Progress Notes (Signed)
Safety precautions to be maintained throughout the outpatient stay will include: orient to surroundings, keep bed in low position, maintain call bell within reach at all times, provide assistance with transfer out of bed and ambulation.  

## 2018-11-11 ENCOUNTER — Other Ambulatory Visit: Payer: Self-pay

## 2018-11-11 ENCOUNTER — Encounter: Payer: Self-pay | Admitting: Psychiatry

## 2018-11-11 ENCOUNTER — Ambulatory Visit (INDEPENDENT_AMBULATORY_CARE_PROVIDER_SITE_OTHER): Payer: Medicare HMO | Admitting: Psychiatry

## 2018-11-11 VITALS — BP 125/86 | HR 83 | Temp 98.2°F | Wt 194.8 lb

## 2018-11-11 DIAGNOSIS — F411 Generalized anxiety disorder: Secondary | ICD-10-CM | POA: Diagnosis not present

## 2018-11-11 DIAGNOSIS — G3184 Mild cognitive impairment, so stated: Secondary | ICD-10-CM

## 2018-11-11 DIAGNOSIS — F5101 Primary insomnia: Secondary | ICD-10-CM | POA: Diagnosis not present

## 2018-11-11 DIAGNOSIS — F331 Major depressive disorder, recurrent, moderate: Secondary | ICD-10-CM

## 2018-11-11 MED ORDER — SUVOREXANT 10 MG PO TABS: 10.0000 mg | ORAL_TABLET | Freq: Every day | ORAL | 0 refills | Status: DC

## 2018-11-11 NOTE — Patient Instructions (Signed)
Suvorexant oral tablets What is this medicine? SUVOREXANT (su-vor-EX-ant) is used to treat insomnia. This medicine helps you to fall asleep and sleep through the night. This medicine may be used for other purposes; ask your health care provider or pharmacist if you have questions. COMMON BRAND NAME(S): Belsomra What should I tell my health care provider before I take this medicine? They need to know if you have any of these conditions: -depression -history of a drug or alcohol abuse problem -history of daytime sleepiness -history of sudden onset of muscle weakness (cataplexy) -liver disease -lung or breathing disease -narcolepsy -suicidal thoughts, plans, or attempt; a previous suicide attempt by you or a family member -an unusual or allergic reaction to suvorexant, other medicines, foods, dyes, or preservatives -pregnant or trying to get pregnant -breast-feeding How should I use this medicine? Take this medicine by mouth within 30 minutes of going to bed. Do not take it unless you are able to stay in bed a full night before you must be active again. Follow the directions on the prescription label. For best results, it is better to take this medicine on an empty stomach. Do not take your medicine more often than directed. Do not stop taking this medicine on your own. Always follow your doctor or health care professional's advice. A special MedGuide will be given to you by the pharmacist with each prescription and refill. Be sure to read this information carefully each time. Talk to your pediatrician regarding the use of this medicine in children. Special care may be needed. Overdosage: If you think you have taken too much of this medicine contact a poison control center or emergency room at once. NOTE: This medicine is only for you. Do not share this medicine with others. What if I miss a dose? This medicine should only be taken immediately before going to sleep. Do not take double or extra  doses. What may interact with this medicine? -alcohol -antiviral medicines for HIV or AIDS -aprepitant -carbamazepine -certain antibiotics like ciprofloxacin, clarithromycin, erythromycin, telithromycin -certain medicines for depression or psychotic disturbances -certain medicines for fungal infections like ketoconazole, posaconazole, fluconazole, or itraconazole -conivaptan -digoxin -diltiazem -grapefruit juice -imatinib -medicines for anxiety or sleep -phenytoin -rifampin -verapamil This list may not describe all possible interactions. Give your health care provider a list of all the medicines, herbs, non-prescription drugs, or dietary supplements you use. Also tell them if you smoke, drink alcohol, or use illegal drugs. Some items may interact with your medicine. What should I watch for while using this medicine? Visit your doctor or health care professional for regular checks on your progress. Keep a regular sleep schedule by going to bed at about the same time each night. Avoid caffeine-containing drinks in the evening hours. When sleep medicines are used every night for more than a few weeks, they may stop working. Do not increase the dose on your own. Talk to your doctor if your insomnia worsens or is not better within 7 to 10 days. After taking this medicine, you may get up out of bed and do an activity that you do not know you are doing. The next morning, you may have no memory of this. Activities include driving a car ("sleep-driving"), making and eating food, talking on the phone, sexual activity, and sleep-walking. Serious injuries have occurred. Call your doctor right away if you find out you have done any of these activities. Do not take this medicine if you have used alcohol that evening. Do not take it  if you have taken another medicine for sleep. The risk of doing these sleep-related activities is higher. Do not take this medicine unless you are able to stay in bed for a full  night (7 to 8 hours) and do not drive or perform other activities requiring full alertness within 8 hours of a dose. Do not drive, use machinery, or do anything that needs mental alertness the day after you take the 20 mg dose of this medicine. The use of lower doses (10 mg) also has the potential to cause driving impairment the next day. You may have a decrease in mental alertness the day after use, even if you feel that you are fully awake. Tell your doctor if you will need to perform activities requiring full alertness, such as driving, the next day. Do not stand or sit up quickly after taking this medicine, especially if you are an older patient. This reduces the risk of dizzy or fainting spells. If you or your family notice any changes in your behavior, such as new or worsening depression, thoughts of harming yourself, anxiety, other unusual or disturbing thoughts, or memory loss, call your doctor right away. What side effects may I notice from receiving this medicine? Side effects that you should report to your doctor or health care professional as soon as possible: -allergic reactions like skin rash, itching or hives, swelling of the face, lips, or tongue -confusion -depressed mood -feeling faint or lightheaded, falls -hallucinations -inability to move or speak for up to several minutes while you are going to sleep or waking up -memory loss -periods of leg weakness lasting from seconds to a few minutes -problems with balance, speaking, walking -restlessness, excitability, or feelings of agitation -unusual activities while asleep like driving, eating, making phone calls Side effects that usually do not require medical attention (report to your doctor or health care professional if they continue or are bothersome): -abnormal dreams -daytime drowsiness -diarrhea -dizziness -headache This list may not describe all possible side effects. Call your doctor for medical advice about side effects.  You may report side effects to FDA at 1-800-FDA-1088. Where should I keep my medicine? Keep out of the reach of children. This medicine can be abused. Keep your medicine in a safe place to protect it from theft. Do not share this medicine with anyone. Selling or giving away this medicine is dangerous and against the law. Store at room temperature between 15 and 30 degrees C (59 and 86 degrees F). Throw away any unused medicine after the expiration date. NOTE: This sheet is a summary. It may not cover all possible information. If you have questions about this medicine, talk to your doctor, pharmacist, or health care provider.  2019 [employer] (2018-04-01 12:32:42)

## 2018-11-11 NOTE — Progress Notes (Signed)
[facility] MD OP Progress Note  11/11/2018 12:43 PM Susan Houston  MRN:  [id number]  Chief Complaint: ' I am here for follow up.' Chief Complaint    Follow-up; Medication Refill     HPI: Susan Houston is a 58 year old Caucasian female, married, unemployed, lives in [place], has a history of depression, vitamin B12 deficiency, mild cognitive impairment, migraine, COPD, chronic pain, degenerative disc disease, tremors, hyperlipidemia, presented to clinic today for a follow-up visit.    Patient today reports that the Belsomra has been helping her to rest better.  She however continues to get 2 to 3 hours of sleep only at night.  Even though it is an improvement she would like to sleep more.  She denies any side effects to the Belsomra.  Discussed with her that the dosage can be increased to 10 mg.  She also has hydroxyzine as needed which she takes during the day for anxiety symptoms.  Discussed with patient to take 1-2 hydroxyzine pills at bedtime to help with her sleep if Belsomra by itself is not helpful.  Patient is currently on Prozac.  Prozac was started on this 22 th January.  Discussed with patient to continue to take the 20 mg.  She is tolerating it well.  Patient continues to struggle with pain.  She follows up with Dr.Naviera pain management.  She reports she has been getting nerve block injections to help with her right rib pain.  She reports that as helpful.  Patient continues to have good social support system from her family.       Visit Diagnosis:    ICD-10-CM   1. MDD (major depressive disorder), recurrent episode, moderate (HCC) F33.1   2. Primary insomnia F51.01 Suvorexant (BELSOMRA) 10 MG TABS  3. GAD (generalized anxiety disorder) F41.1   4. MCI (mild cognitive impairment) G31.84     Past Psychiatric History: Reviewed past psychiatric history from my progress note on 01/14/2018.  Past trials of Lexapro.  Past Medical History:  Past Medical History:  Diagnosis Date  . Anemia     vitamin b12 deficiency. no longer taking supplements  . Anxiety   . Arthritis   . Asthma   . Back pain   . Bruises easily   . Cancer ([place]) 2009   LARYNX CANCER - CHEMO / RADIATION (NO SURGERY)   . Chronic leg pain    BILATERAL  . COPD (chronic obstructive pulmonary disease) (HCC)    no inhalers for over 1 year  . Depression   . Difficulty sleeping   . Diverticulitis   . Dysrhythmia 04/2018   brady episodes. cleared by dr. Clayborn Bigness  . Facet syndrome, lumbar 03/28/2015  . Hemorrhoids   . History of kidney stones 04/2018   recently passed stone  . Hyperlipidemia   . Hypertension 2019   not on any treatment currently  . Incontinence of urine   . Laryngeal cancer ([place]) 2009  . Lung cancer ([place]) 04/2018   diagnosed via ct scan  . Lung cancer ([place]) 04/18/2018  . Migraine   . Migraine without aura and without status migrainosus, not intractable 09/11/2016  . Mild cognitive impairment 11/23/2017  . MRSA infection greater than 3 months ago    2008 right side of face  . Nerve damage    right leg. thinks this is from her TKR  . Nodule of lower lobe of right lung 04/22/2018  . Oxygen deficiency    2L/HS  . Wears dentures    full upper and  lower    Past Surgical History:  Procedure Laterality Date  . ABDOMINAL HYSTERECTOMY  1983  . BLADDER SUSPENSION  2014  . CATARACT EXTRACTION W/PHACO Left 05/11/2018   Procedure: CATARACT EXTRACTION PHACO AND INTRAOCULAR LENS PLACEMENT ([place]) LEFT;  Surgeon: Leandrew Koyanagi, MD;  Location: [facility];  Service: Ophthalmology;  Laterality: Left;  PREFERS EARLY  . CATARACT EXTRACTION W/PHACO Right 08/11/2018   Procedure: CATARACT EXTRACTION PHACO AND INTRAOCULAR LENS PLACEMENT ([facility]);  Surgeon: Leandrew Koyanagi, MD;  Location: [facility];  Service: Ophthalmology;  Laterality: Right;  CDE 5:02 Total Time 01:35 Fluid lot # [id number]  . CHOLECYSTECTOMY  1998  . COLONOSCOPY    . ESOPHAGOGASTRODUODENOSCOPY  2015  . ESOPHAGOGASTRODUODENOSCOPY  (EGD) WITH PROPOFOL N/A 11/20/2016   Procedure: ESOPHAGOGASTRODUODENOSCOPY (EGD) WITH PROPOFOL;  Surgeon: Jonathon Bellows, MD;  Location: [facility];  Service: Endoscopy;  Laterality: N/A;  . EXCISION NEUROMA Bilateral 09/24/2014   Procedure: BILATERAL OPEN SAPHENOUS NEURECTOMIES AND OPEN LEFT PERIPATELLA OSETOPHYTECTOMY;  Surgeon: Mauri Pole, MD;  Location: [facility];  Service: Orthopedics;  Laterality: Bilateral;  . EYE SURGERY Left 05/11/2018   cataract extraction  . JOINT REPLACEMENT  2009/2010   BIL TOTAL KNEES  . THORACOTOMY/LOBECTOMY Right 05/16/2018   Procedure: THORACOTOMY/POSSIBLE LOBECTOMY;  Surgeon: Nestor Lewandowsky, MD;  Location: [facility];  Service: Thoracic;  Laterality: Right;  . TONSILLECTOMY    . VIDEO BRONCHOSCOPY N/A 05/16/2018   Procedure: PREOP  BRONCHOSCOPY;  Surgeon: Nestor Lewandowsky, MD;  Location: [facility];  Service: Thoracic;  Laterality: N/A;    Family Psychiatric History: Reviewed family psychiatric history from my progress note on 01/14/2018.  Family History:  Family History  Problem Relation Age of Onset  . Asthma Mother   . Diabetes Mother   . Hyperlipidemia Mother   . Hypertension Mother   . Cirrhosis Mother        Non-alcoholic  . Arthritis Father   . Cancer Father        Bone  . Hyperlipidemia Father   . Hypertension Father   . Anxiety disorder Sister   . Depression Sister   . Kidney Stones Son   . Stroke Maternal Grandmother   . Breast cancer Maternal Grandmother 46  . Cancer Maternal Grandfather        lung  . Pneumonia Paternal Grandmother   . Alzheimer's disease Paternal Grandfather   . Diabetes Sister   . Gout Sister   . Hypertension Sister   . Alcohol abuse Brother   . Heart disease Brother        massive MI    Social History: Reviewed social history from my progress note on 01/14/2018. Social History   Socioeconomic History  . Marital status: Married    Spouse name: Tharon Aquas  . Number of children: 2  . Years of education: 63  .  Highest education level: 12th grade  Occupational History  . Occupation: Disabled  Social Needs  . Financial resource strain: Not hard at all  . Food insecurity:    Worry: Never true    Inability: Never true  . Transportation needs:    Medical: No    Non-medical: No  Tobacco Use  . Smoking status: Former Smoker    Packs/day: 0.50    Types: Cigarettes    Last attempt to quit: 04/07/2016    Years since quitting: 2.5  . Smokeless tobacco: Never Used  . Tobacco comment: patient has not smoked x 10 days.06/28/17 chantix  Substance and Sexual Activity  . Alcohol  use: No    Alcohol/week: 0.0 standard drinks  . Drug use: No  . Sexual activity: Yes    Partners: Male    Birth control/protection: None  Lifestyle  . Physical activity:    Days per week: 0 days    Minutes per session: 0 min  . Stress: Not at all  Relationships  . Social connections:    Talks on phone: Never    Gets together: Twice a week    Attends religious service: More than 4 times per year    Active member of club or organization: No    Attends meetings of clubs or organizations: Never    Relationship status: Married  Other Topics Concern  . Not on file  Social History Narrative   Lives at home w/ her husband   Right-handed   Caffeine: occasionally    Allergies:  Allergies  Allergen Reactions  . Morphine And Related Itching  . Cymbalta [Duloxetine Hcl] Other (See Comments)    Pt states it keeps her awake  . Adhesive [Tape] Other (See Comments)    Plastic tape rips skin and bruises her. PLEASE USE PAPER TAPE  . Lexapro [Escitalopram Oxalate] Other (See Comments)    Keeps awake for days.     Metabolic Disorder Labs: No results found for: HGBA1C, MPG No results found for: PROLACTIN Lab Results  Component Value Date   CHOL 169 12/03/2017   TRIG 126 12/03/2017   HDL 45 12/03/2017   LDLCALC 99 12/03/2017   LDLCALC 87 07/10/2016   Lab Results  Component Value Date   TSH 3.676 09/11/2018   TSH  3.16 05/21/2014    Therapeutic Level Labs: No results found for: LITHIUM No results found for: VALPROATE No components found for:  CBMZ  Current Medications: Current Outpatient Medications  Medication Sig Dispense Refill  . albuterol (PROVENTIL HFA;VENTOLIN HFA) 108 (90 Base) MCG/ACT inhaler Inhale 2 puffs into the lungs every 4 (four) hours as needed for wheezing or shortness of breath. 1 Inhaler 2  . aspirin 81 MG tablet Take 81 mg by mouth daily.    Marland Kitchen azithromycin (ZITHROMAX) 250 MG tablet 2 tabs today then 1 tab daily for 4 days 6 tablet 0  . clonazePAM (KLONOPIN) 1 MG tablet Take 1 tablet (1 mg total) by mouth at bedtime. 30 tablet 0  . FLUoxetine (PROZAC) 20 MG capsule Take 1 capsule (20 mg total) by mouth daily with breakfast. For mood 30 capsule 0  . gabapentin (NEURONTIN) 300 MG capsule Take 1-3 capsules (300-900 mg total) by mouth 4 (four) times daily. Follow the written titration instructions. 360 capsule 2  . hydrOXYzine (VISTARIL) 25 MG capsule Take 1 capsule (25 mg total) by mouth 2 (two) times daily as needed. For severe anxiety symptoms 60 capsule 1  . predniSONE (DELTASONE) 50 MG tablet Take 1 tablet (50 mg total) by mouth daily with breakfast. 5 tablet 0  . Suvorexant (BELSOMRA) 10 MG TABS Take 10 mg by mouth at bedtime. 15 tablet 0   No current facility-administered medications for this visit.    Facility-Administered Medications Ordered in Other Visits  Medication Dose Route Frequency Provider Last Rate Last Dose  . ipratropium-albuterol (DUONEB) 0.5-2.5 (3) MG/3ML nebulizer solution 3 mL  3 mL Nebulization Q6H Burns, Wandra Feinstein, NP         Musculoskeletal: Strength & Muscle Tone: within normal limits Gait & Station: normal Patient leans: N/A  Psychiatric Specialty Exam: Review of Systems  Musculoskeletal:  Rib pain  Psychiatric/Behavioral: The patient is nervous/anxious and has insomnia.   All other systems reviewed and are negative.   Blood pressure  125/86, pulse 83, temperature 98.2 F (36.8 C), temperature source Oral, weight 194 lb 12.8 oz (88.4 kg).Body mass index is 35.63 kg/m.  General Appearance: Casual  Eye Contact:  Fair  Speech:  Normal Rate  Volume:  Normal  Mood:  Anxious  Affect:  Congruent  Thought Process:  Goal Directed and Descriptions of Associations: Intact  Orientation:  Full (Time, Place, and Person)  Thought Content: Logical   Suicidal Thoughts:  No  Homicidal Thoughts:  No  Memory:  Immediate;   Fair Recent;   Fair Remote;   Fair  Judgement:  Fair  Insight:  Fair  Psychomotor Activity:  Normal  Concentration:  Concentration: Fair and Attention Span: Fair  Recall:  [employer] of Knowledge: Fair  Language: Fair  Akathisia:  No  Handed:  Right  AIMS (if indicated): Denies tremors, rigidity, stiffness  Assets:  Communication Skills Desire for Improvement Social Support  ADL's:  Intact  Cognition: WNL  Sleep:  restless   Screenings: GAD-7     Office Visit from 10/25/2018 in [facility] Visit from 09/16/2018 in [facility] Visit from 07/13/2018 in [facility] Visit from 06/28/2018 in [facility] Visit from 04/15/2018 in [facility]  Total GAD-7 Score  9  9  19  6  8     PHQ2-9     Office Visit from 11/09/2018 in [facility] Procedure visit from 10/27/2018 in [facility] Office Visit from 10/25/2018 in [facility] Procedure visit from 09/29/2018 in [facility] Office Visit from 09/16/2018 in [facility]  PHQ-2 Total Score  2  1  1   0  2  PHQ-9 Total Score  8  -  7  5  13        Assessment and Plan:  Emmarie is a 58 year old Caucasian female, history of depression, history of delirium, tremors, migraine headaches, falls, vitamin B12 deficiency, MCI, gait instability,  bilateral knee pain, presented to clinic today for a follow-up visit.  Patient was initially referred to the clinic by neurology to get a second opinion on her psychiatric medication due to her most recent confusion memory loss and falls.  Patient continues to struggle with anxiety, sleep.  Patient also recovering from primary lung cancer stage I status post resection and continues to struggle with chronic pain.  Discussed plan as noted below.  Plan MDD-unstable Prozac 20 mg p.o. daily started a week ago. Patient has been referred for psychotherapy sessions.  For generalized anxiety disorder-unstable Prozac as prescribed Hydroxyzine 25 mg p.o. twice daily as needed She also has Klonopin 1 mg at bedtime as needed prescribed per PMD.   Insomnia- unstable Increase Belsomra to 10 mg p.o. nightly Discussed with patient to take hydroxyzine 25-50 at bedtime as needed.  For MCI Patient currently follows up with neurology.  Patient has signed a release to obtain medical records from previous psychiatrist-Dr. Su.  Have reviewed medical records per Dr. Dossie Arbour dated 09/10/2019-' patient with right sided rib pain will benefit from gabapentin as well as intercostal nerve block.'  Follow-up in clinic in 4 weeks or sooner if needed.  Patient however call the clinic back to discuss her sleep in 2 weeks from now.  I have  spent atleast 25 minutes face to face with patient today. More than 50 % of the time was spent for psychoeducation and supportive psychotherapy and care coordination.  This note was generated in part or whole with voice recognition software. Voice recognition is usually quite accurate but there are transcription errors that can and very often do occur. I apologize for any typographical errors that were not detected and corrected.          Susan Alert, MD 11/11/2018, 12:43 PM

## 2018-11-12 ENCOUNTER — Emergency Department
Admission: EM | Admit: 2018-11-12 | Discharge: 2018-11-12 | Disposition: A | Discharge status: Home / Self Care | Payer: Medicare HMO | Attending: Emergency Medicine | Admitting: Emergency Medicine

## 2018-11-12 ENCOUNTER — Emergency Department: Payer: Medicare HMO

## 2018-11-12 ENCOUNTER — Other Ambulatory Visit: Payer: Self-pay

## 2018-11-12 DIAGNOSIS — Z7982 Long term (current) use of aspirin: Secondary | ICD-10-CM | POA: Insufficient documentation

## 2018-11-12 DIAGNOSIS — Z87891 Personal history of nicotine dependence: Secondary | ICD-10-CM | POA: Diagnosis not present

## 2018-11-12 DIAGNOSIS — I10 Essential (primary) hypertension: Secondary | ICD-10-CM | POA: Insufficient documentation

## 2018-11-12 DIAGNOSIS — Z79899 Other long term (current) drug therapy: Secondary | ICD-10-CM | POA: Insufficient documentation

## 2018-11-12 DIAGNOSIS — R05 Cough: Secondary | ICD-10-CM | POA: Diagnosis not present

## 2018-11-12 DIAGNOSIS — J449 Chronic obstructive pulmonary disease, unspecified: Secondary | ICD-10-CM | POA: Insufficient documentation

## 2018-11-12 DIAGNOSIS — J9801 Acute bronchospasm: Secondary | ICD-10-CM | POA: Diagnosis not present

## 2018-11-12 MED ORDER — GUAIFENESIN-CODEINE 100-10 MG/5ML PO SOLN: 10.0000 mL | Freq: Three times a day (TID) | ORAL | 0 refills | Status: DC | PRN

## 2018-11-12 MED ORDER — HYDROCOD POLST-CPM POLST ER 10-8 MG/5ML PO SUER
5.0000 mL | Freq: Once | ORAL | Status: AC
  Administered 2018-11-12: 5 mL via ORAL
  Filled 2018-11-12: qty 5

## 2018-11-12 MED ORDER — IPRATROPIUM-ALBUTEROL 0.5-2.5 (3) MG/3ML IN SOLN
3.0000 mL | Freq: Once | RESPIRATORY_TRACT | Status: AC
  Administered 2018-11-12: 3 mL via RESPIRATORY_TRACT
  Filled 2018-11-12: qty 3

## 2018-11-12 MED ORDER — METHYLPREDNISOLONE 4 MG PO TBPK: ORAL_TABLET | ORAL | 0 refills | Status: DC

## 2018-11-12 NOTE — ED Notes (Signed)
Pt had call light on, states she needs something else for pain because the cough medicine isn't helping.  Informed Ron PA-C, no new orders, EDP to room to talk to patient.

## 2018-11-12 NOTE — ED Provider Notes (Signed)
[facility] Emergency Department Provider Note   ____________________________________________   First MD Initiated Contact with Patient 11/12/18 1522     (approximate)  I have reviewed the triage vital signs and the nursing notes.   HISTORY  Chief Complaint Cough    HPI Susan Houston is a 58 y.o. female patient presents with productive cough for few days.  Patient also complained of chest congestion.  Patient states chest wall pain secondary to prolonged coughing.  Patient unsure of fever.  Patient denies nausea, vomiting, diarrhea.  Patienth as taken flu shot for this season.  Past Medical History:  Diagnosis Date  . Anemia    vitamin b12 deficiency. no longer taking supplements  . Anxiety   . Arthritis   . Asthma   . Back pain   . Bruises easily   . Cancer ([place]) 2009   LARYNX CANCER - CHEMO / RADIATION (NO SURGERY)   . Chronic leg pain    BILATERAL  . COPD (chronic obstructive pulmonary disease) (HCC)    no inhalers for over 1 year  . Depression   . Difficulty sleeping   . Diverticulitis   . Dysrhythmia 04/2018   brady episodes. cleared by dr. Clayborn Bigness  . Facet syndrome, lumbar 03/28/2015  . Hemorrhoids   . History of kidney stones 04/2018   recently passed stone  . Hyperlipidemia   . Hypertension 2019   not on any treatment currently  . Incontinence of urine   . Laryngeal cancer ([place]) 2009  . Lung cancer ([place]) 04/2018   diagnosed via ct scan  . Lung cancer ([place]) 04/18/2018  . Migraine   . Migraine without aura and without status migrainosus, not intractable 09/11/2016  . Mild cognitive impairment 11/23/2017  . MRSA infection greater than 3 months ago    2008 right side of face  . Nerve damage    right leg. thinks this is from her TKR  . Nodule of lower lobe of right lung 04/22/2018  . Oxygen deficiency    2L/HS  . Wears dentures    full upper and lower    Patient Active Problem List   Diagnosis Date Noted  . Costochondritis  10/27/2018  . Primary insomnia 09/16/2018  . Chronic chest wall pain following surgery (Right) 09/08/2018  . Intercostal pain (Right) 09/08/2018  . Intercostal neuralgia 09/08/2018  . 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 Chronic post-thoracotomy pain 08/29/2018  . Neurogenic pain 08/29/2018  . Vitamin D insufficiency 08/29/2018  . Rib pain (Primary Area of Pain) (Right) 08/18/2018  . Incisional pain (Secondary Area of Pain) 08/18/2018  . Chronic pain syndrome 08/18/2018  . Long term current use of opiate analgesic 08/18/2018  . Pharmacologic therapy 08/18/2018  . Disorder of skeletal system 08/18/2018  . Problems influencing health status 08/18/2018  . Controlled substance agreement signed 06/28/2018  . Primary cancer of right lower lobe of lung ([place]) 05/27/2018  . Lung cancer ([place]) 05/16/2018  . Laryngeal cancer ([place]) 04/22/2018  . Mass of lower lobe of right lung 04/22/2018  . Vitamin B12 deficiency 12/15/2017  . Frequent falls 11/23/2017  . Mild cognitive impairment 11/23/2017  . Migraine without aura and without status migrainosus, not intractable 09/11/2016  . Hyperlipidemia   . COPD (chronic obstructive pulmonary disease) ([place])   . Severe depression ([place]) 12/24/2015  . Chronic anxiety 12/24/2015  . Coarse tremors 12/24/2015  . Lumbar facet syndrome 03/28/2015  . DDD (degenerative disc disease), lumbar 03/05/2015  . DDD (degenerative disc  disease), lumbosacral 02/23/2015  . Sacroiliac joint dysfunction 02/23/2015  . Saphenous neuralgia 09/24/2014    Past Surgical History:  Procedure Laterality Date  . ABDOMINAL HYSTERECTOMY  1983  . BLADDER SUSPENSION  2014  . CATARACT EXTRACTION W/PHACO Left 05/11/2018   Procedure: CATARACT EXTRACTION PHACO AND INTRAOCULAR LENS PLACEMENT ([place]) LEFT;  Surgeon: Leandrew Koyanagi, MD;  Location: [facility];  Service: Ophthalmology;  Laterality: Left;  PREFERS EARLY  . CATARACT EXTRACTION W/PHACO Right 08/11/2018    Procedure: CATARACT EXTRACTION PHACO AND INTRAOCULAR LENS PLACEMENT ([facility]);  Surgeon: Leandrew Koyanagi, MD;  Location: [facility];  Service: Ophthalmology;  Laterality: Right;  CDE 5:02 Total Time 01:35 Fluid lot # [id number]  . CHOLECYSTECTOMY  1998  . COLONOSCOPY    . ESOPHAGOGASTRODUODENOSCOPY  2015  . ESOPHAGOGASTRODUODENOSCOPY (EGD) WITH PROPOFOL N/A 11/20/2016   Procedure: ESOPHAGOGASTRODUODENOSCOPY (EGD) WITH PROPOFOL;  Surgeon: Jonathon Bellows, MD;  Location: [facility];  Service: Endoscopy;  Laterality: N/A;  . EXCISION NEUROMA Bilateral 09/24/2014   Procedure: BILATERAL OPEN SAPHENOUS NEURECTOMIES AND OPEN LEFT PERIPATELLA OSETOPHYTECTOMY;  Surgeon: Mauri Pole, MD;  Location: [facility];  Service: Orthopedics;  Laterality: Bilateral;  . EYE SURGERY Left 05/11/2018   cataract extraction  . JOINT REPLACEMENT  2009/2010   BIL TOTAL KNEES  . THORACOTOMY/LOBECTOMY Right 05/16/2018   Procedure: THORACOTOMY/POSSIBLE LOBECTOMY;  Surgeon: Nestor Lewandowsky, MD;  Location: [facility];  Service: Thoracic;  Laterality: Right;  . TONSILLECTOMY    . VIDEO BRONCHOSCOPY N/A 05/16/2018   Procedure: PREOP  BRONCHOSCOPY;  Surgeon: Nestor Lewandowsky, MD;  Location: [facility];  Service: Thoracic;  Laterality: N/A;    Prior to Admission medications   Medication Sig Start Date End Date Taking? Authorizing Provider  albuterol (PROVENTIL HFA;VENTOLIN HFA) 108 (90 Base) MCG/ACT inhaler Inhale 2 puffs into the lungs every 4 (four) hours as needed for wheezing or shortness of breath. 07/01/18   Flora Lipps, MD  aspirin 81 MG tablet Take 81 mg by mouth daily.    [provider]  azithromycin (ZITHROMAX) 250 MG tablet 2 tabs today then 1 tab daily for 4 days 11/04/18   Park Liter P, DO  clonazePAM (KLONOPIN) 1 MG tablet Take 1 tablet (1 mg total) by mouth at bedtime. 10/25/18   Johnson, Megan P, DO  FLUoxetine (PROZAC) 20 MG capsule Take 1 capsule (20 mg total) by mouth daily with breakfast. For mood 11/04/18    Ursula Alert, MD  gabapentin (NEURONTIN) 300 MG capsule Take 1-3 capsules (300-900 mg total) by mouth 4 (four) times daily. Follow the written titration instructions. 11/09/18 02/07/19  Milinda Pointer, MD  guaiFENesin-codeine 100-10 MG/5ML syrup Take 10 mLs by mouth 3 (three) times daily as needed for cough. 11/12/18   Sable Feil, PA-C  hydrOXYzine (VISTARIL) 25 MG capsule Take 1 capsule (25 mg total) by mouth 2 (two) times daily as needed. For severe anxiety symptoms 11/03/18   Ursula Alert, MD  methylPREDNISolone (MEDROL DOSEPAK) 4 MG TBPK tablet Take Tapered dose as directed 11/12/18   Sable Feil, PA-C  predniSONE (DELTASONE) 50 MG tablet Take 1 tablet (50 mg total) by mouth daily with breakfast. 11/04/18   Wynetta Emery, Megan P, DO  Suvorexant (BELSOMRA) 10 MG TABS Take 10 mg by mouth at bedtime. 11/11/18   Ursula Alert, MD    Allergies Morphine and related; Cymbalta [duloxetine hcl]; Adhesive [tape]; and Lexapro [escitalopram oxalate]  Family History  Problem Relation Age of Onset  . Asthma Mother   . Diabetes Mother   .  Hyperlipidemia Mother   . Hypertension Mother   . Cirrhosis Mother        Non-alcoholic  . Arthritis Father   . Cancer Father        Bone  . Hyperlipidemia Father   . Hypertension Father   . Anxiety disorder Sister   . Depression Sister   . Kidney Stones Son   . Stroke Maternal Grandmother   . Breast cancer Maternal Grandmother 72  . Cancer Maternal Grandfather        lung  . Pneumonia Paternal Grandmother   . Alzheimer's disease Paternal Grandfather   . Diabetes Sister   . Gout Sister   . Hypertension Sister   . Alcohol abuse Brother   . Heart disease Brother        massive MI    Social History Social History   Tobacco Use  . Smoking status: Former Smoker    Packs/day: 0.50    Types: Cigarettes    Last attempt to quit: 04/07/2016    Years since quitting: 2.6  . Smokeless tobacco: Never Used  . Tobacco comment: patient has not smoked  x 10 days.06/28/17 chantix  Substance Use Topics  . Alcohol use: No    Alcohol/week: 0.0 standard drinks  . Drug use: No    Review of Systems  Constitutional: No fever/chills Eyes: No visual changes. ENT: No sore throat. Cardiovascular: Denies chest pain. Respiratory: Denies shortness of breath. Gastrointestinal: No abdominal pain.  No nausea, no vomiting.  No diarrhea.  No constipation. Genitourinary: Negative for dysuria. Musculoskeletal: Negative for back pain. Skin: Negative for rash. Neurological: Negative for headaches, focal weakness or numbness. Psychiatric:  Anxiety and depression. Endocrine:  Hyperlipidemia and hypertension. Hematological/Lymphatic:  Anemia. Allergic/Immunilogical: See medicine list. ____________________________________________   PHYSICAL EXAM:  VITAL SIGNS: ED Triage Vitals  Enc Vitals Group     BP 11/12/18 1514 132/71     Pulse Rate 11/12/18 1514 83     Resp 11/12/18 1514 18     Temp 11/12/18 1514 98.2 F (36.8 C)     Temp Source 11/12/18 1514 Oral     SpO2 11/12/18 1514 95 %     Weight 11/12/18 1515 195 lb (88.5 kg)     Height 11/12/18 1515 5\' 2"  (1.575 m)     Head Circumference --      Peak Flow --      Pain Score 11/12/18 1515 7     Pain Loc --      Pain Edu? --      Excl. in [place]? --    Constitutional: Alert and oriented. Well appearing and in no acute distress. Eyes: Conjunctivae are normal. PERRL. EOMI. Head: Atraumatic. Nose: Edematous nasal turbinates. Mouth/Throat: Mucous membranes are moist.  Oropharynx non-erythematous.  Postnasal drainage. Neck: No stridor.  Hematological/Lymphatic/Immunilogical: No cervical lymphadenopathy. Cardiovascular: Normal rate, regular rhythm. Grossly normal heart sounds.  Good peripheral circulation. Respiratory: Normal respiratory effort.  No retractions. Lungs CTAB. Gastrointestinal: Soft and nontender. No distention. No abdominal bruits. No CVA tenderness. Musculoskeletal: Bilateral chest wall  pain secondary to coughing.   Neurologic:  Normal speech and language. No gross focal neurologic deficits are appreciated. No gait instability. Skin:  Skin is warm, dry and intact. No rash noted. Psychiatric: Mood and affect are normal. Speech and behavior are normal.  ____________________________________________   LABS (all labs ordered are listed, but only abnormal results are displayed)  Labs Reviewed - No data to display ____________________________________________  EKG   ____________________________________________  RADIOLOGY  ED MD interpretation:    Official radiology report(s): Dg Chest 2 View  Result Date: 11/12/2018 CLINICAL DATA:  Productive cough and congestion for 3 days. EXAM: CHEST - 2 VIEW COMPARISON:  Body CT 10/28/2018 FINDINGS: Cardiomediastinal silhouette is normal. Mediastinal contours appear intact. There is no evidence of focal airspace consolidation, pleural effusion or pneumothorax. Stable postsurgical changes from right lower lobe resection. Osseous structures are without acute abnormality. Soft tissues are grossly normal. IMPRESSION: No active cardiopulmonary disease. Electronically Signed   By: Fidela Salisbury M.D.   On: 11/12/2018 16:02    ____________________________________________   PROCEDURES  Procedure(s) performed: None  Procedures  Critical Care performed: No  ____________________________________________   INITIAL IMPRESSION / ASSESSMENT AND PLAN / ED COURSE  As part of my medical decision making, I reviewed the following data within the electronic MEDICAL RECORD NUMBER     Cough due to bronchospasm.  Discussed negative chest x-ray findings with patient.  Patient given discharge care instruction advised take medication as directed.  Patient advised follow-up PCP in 2 days if no improvement.      ____________________________________________   FINAL CLINICAL IMPRESSION(S) / ED DIAGNOSES  Final diagnoses:  Cough due to  bronchospasm     ED Discharge Orders         Ordered    guaiFENesin-codeine 100-10 MG/5ML syrup  3 times daily PRN     11/12/18 1628    methylPREDNISolone (MEDROL DOSEPAK) 4 MG TBPK tablet     11/12/18 1628           Note:  This document was prepared using Dragon voice recognition software and may include unintentional dictation errors.    Sable Feil, PA-C 11/12/18 1634    Lavonia Drafts, MD 11/14/18 1225

## 2018-11-12 NOTE — ED Triage Notes (Signed)
Pt states productive  Cough x few days. Strong congested cough in triage. C/o of back/chest pain from coughing so much. A&O, ambulatory. Mask in place. Congestion noted.

## 2018-11-14 ENCOUNTER — Ambulatory Visit: Payer: Medicare HMO | Admitting: Pain Medicine

## 2018-11-14 DIAGNOSIS — Z96652 Presence of left artificial knee joint: Secondary | ICD-10-CM | POA: Diagnosis not present

## 2018-11-14 DIAGNOSIS — M25362 Other instability, left knee: Secondary | ICD-10-CM | POA: Diagnosis not present

## 2018-11-14 DIAGNOSIS — T8484XD Pain due to internal orthopedic prosthetic devices, implants and grafts, subsequent encounter: Secondary | ICD-10-CM | POA: Diagnosis not present

## 2018-11-15 ENCOUNTER — Ambulatory Visit (HOSPITAL_BASED_OUTPATIENT_CLINIC_OR_DEPARTMENT_OTHER): Payer: Medicare HMO | Admitting: Pain Medicine

## 2018-11-15 ENCOUNTER — Other Ambulatory Visit: Payer: Self-pay

## 2018-11-15 ENCOUNTER — Ambulatory Visit
Admission: RE | Admit: 2018-11-15 | Discharge: 2018-11-15 | Disposition: A | Discharge status: Home / Self Care | Payer: Medicare HMO | Source: Ambulatory Visit | Attending: Pain Medicine | Admitting: Pain Medicine

## 2018-11-15 ENCOUNTER — Encounter: Payer: Self-pay | Admitting: Pain Medicine

## 2018-11-15 VITALS — BP 128/60 | HR 82 | Temp 97.6°F | Resp 15 | Ht 62.0 in | Wt 195.0 lb

## 2018-11-15 DIAGNOSIS — R0781 Pleurodynia: Secondary | ICD-10-CM | POA: Insufficient documentation

## 2018-11-15 DIAGNOSIS — G588 Other specified mononeuropathies: Secondary | ICD-10-CM

## 2018-11-15 DIAGNOSIS — G8912 Acute post-thoracotomy pain: Secondary | ICD-10-CM

## 2018-11-15 DIAGNOSIS — R0782 Intercostal pain: Secondary | ICD-10-CM

## 2018-11-15 DIAGNOSIS — G8922 Chronic post-thoracotomy pain: Secondary | ICD-10-CM | POA: Insufficient documentation

## 2018-11-15 DIAGNOSIS — M940 Chondrocostal junction syndrome [Tietze]: Secondary | ICD-10-CM | POA: Insufficient documentation

## 2018-11-15 MED ORDER — LIDOCAINE HCL 2 % IJ SOLN
20.0000 mL | Freq: Once | INTRAMUSCULAR | Status: AC
  Administered 2018-11-15: 400 mg
  Filled 2018-11-15: qty 40

## 2018-11-15 MED ORDER — DEXAMETHASONE SODIUM PHOSPHATE 10 MG/ML IJ SOLN
10.0000 mg | Freq: Once | INTRAMUSCULAR | Status: AC
  Administered 2018-11-15: 10 mg
  Filled 2018-11-15: qty 1

## 2018-11-15 MED ORDER — MIDAZOLAM HCL 5 MG/5ML IJ SOLN
1.0000 mg | INTRAMUSCULAR | Status: DC | PRN
  Administered 2018-11-15: 5 mg via INTRAVENOUS
  Filled 2018-11-15: qty 5

## 2018-11-15 MED ORDER — ROPIVACAINE HCL 2 MG/ML IJ SOLN
4.0000 mL | Freq: Once | INTRAMUSCULAR | Status: AC
  Administered 2018-11-15: 4 mL
  Filled 2018-11-15: qty 10

## 2018-11-15 MED ORDER — METHYLPREDNISOLONE ACETATE 80 MG/ML IJ SUSP
80.0000 mg | Freq: Once | INTRAMUSCULAR | Status: AC
  Administered 2018-11-15: 80 mg via INTRA_ARTICULAR
  Filled 2018-11-15: qty 1

## 2018-11-15 MED ORDER — LACTATED RINGERS IV SOLN
1000.0000 mL | Freq: Once | INTRAVENOUS | Status: AC
  Administered 2018-11-15: 1000 mL via INTRAVENOUS

## 2018-11-15 MED ORDER — ROPIVACAINE HCL 2 MG/ML IJ SOLN
9.0000 mL | Freq: Once | INTRAMUSCULAR | Status: AC
  Administered 2018-11-15: 9 mL via PERINEURAL
  Filled 2018-11-15: qty 10

## 2018-11-15 MED ORDER — FENTANYL CITRATE (PF) 100 MCG/2ML IJ SOLN
25.0000 ug | INTRAMUSCULAR | Status: DC | PRN
  Administered 2018-11-15: 100 ug via INTRAVENOUS
  Filled 2018-11-15: qty 2

## 2018-11-15 NOTE — Patient Instructions (Signed)

## 2018-11-15 NOTE — Progress Notes (Signed)
Patient's Name: Susan Houston  MRN: [id number]  Referring Provider: Milinda Pointer, MD  DOB: Feb 09, 1961  PCP: Valerie Roys, DO  DOS: 11/15/2018  Note by: Gaspar Cola, MD  Service setting: Ambulatory outpatient  Specialty: Interventional Pain Management  Patient type: Established  Location: [facility] (AMB) Pain Management Facility  Visit type: Interventional Procedure   Primary Reason for Visit: Interventional Pain Management Treatment. CC: Pain (Right Rib Radiating to R Breast)  Procedure #1:  Anesthesia, Analgesia, Anxiolysis:  Type: Palliative Posterior Intercostal  Nerve Block  #4  Region: Posterolateral Thoracic Area Level: T5, T6, T7Ribs Laterality: Right-Sided  Type: Moderate (Conscious) Sedation combined with Local Anesthesia Indication(s): Analgesia and Anxiety Route: Intravenous (IV) IV Access: Secured Sedation: Meaningful verbal contact was maintained at all times during the procedure  Local Anesthetic: Lidocaine 1-2%  Position: Lateral decubitus, tilted anteriorly (approx. 45 debrees), w/ the painful side up   Indications: 1. Chronic post-thoracotomy pain   2. Intercostal pain (Right)   3. Intercostal neuralgia   4. Post-thoracotomy pain syndrome   5. Rib pain (Primary Area of Pain) (Right)    Procedure #2:  Anesthesia, Analgesia, Anxiolysis:  Type: Ligament/Tendon insertion (19622) Injection.  #2  Purpose: Diagnostic/Therapeutric Target Area: Lower costochondral joints Region: Sternum Approach: Percutanous Level: Above xiphoid Laterality: Right-side  Type: Moderate (Conscious) Sedation combined with Local Anesthesia Indication(s): Analgesia and Anxiety Local Anesthetic: Lidocaine 1-2% Route: Intravenous (IV) IV Access: Secured Sedation: Meaningful verbal contact was maintained at all times during the procedure   Position: Supine   Indications: 1. Costochondritis    Pain Score: Pre-procedure: 7 /10 Post-procedure: 0-No pain/10  Pre-op Assessment:   Susan Houston is a 58 y.o. (year old), female patient, seen today for interventional treatment. She  has a past surgical history that includes Bladder suspension (2014); Tonsillectomy; Excision neuroma (Bilateral, 09/24/2014); Esophagogastroduodenoscopy (2015); Colonoscopy; Esophagogastroduodenoscopy (egd) with propofol (N/A, 11/20/2016); Joint replacement (2009/2010); Abdominal hysterectomy (1983); Eye surgery (Left, 05/11/2018); Cholecystectomy (1998); Cataract extraction w/PHACO (Left, 05/11/2018); Thoracotomy/lobectomy (Right, 05/16/2018); Video bronchoscopy (N/A, 05/16/2018); and Cataract extraction w/PHACO (Right, 08/11/2018). Susan Houston has a current medication list which includes the following prescription(s): albuterol, aspirin, clonazepam, fluoxetine, gabapentin, guaifenesin-codeine, hydroxyzine, methylprednisolone, suvorexant, azithromycin, and prednisone, and the following Facility-Administered Medications: fentanyl, ipratropium-albuterol, and midazolam. Her primarily concern today is the Pain (Right Rib Radiating to R Breast)  Initial Vital Signs:  Pulse/HCG Rate: 82ECG Heart Rate: 70 Temp: 97.8 F (36.6 C) Resp: 18 BP: 130/83 SpO2: 100 %  BMI: Estimated body mass index is 35.67 kg/m as calculated from the following:   Height as of this encounter: 5\' 2"  (1.575 m).   Weight as of this encounter: 195 lb (88.5 kg).  Risk Assessment: Allergies: Reviewed. She is allergic to morphine and related; cymbalta [duloxetine hcl]; adhesive [tape]; and lexapro [escitalopram oxalate].  Allergy Precautions: None required Coagulopathies: Reviewed. None identified.  Blood-thinner therapy: None at this time Active Infection(s): Reviewed. None identified. Susan Houston is afebrile  Site Confirmation: Susan Houston was asked to confirm the procedure and laterality before marking the site Procedure checklist: Completed Consent: Before the procedure and under the influence of no sedative(s), amnesic(s), or anxiolytics, the  patient was informed of the treatment options, risks and possible complications. To fulfill our ethical and legal obligations, as recommended by the American Medical Association's Code of Ethics, I have informed the patient of my clinical impression; the nature and purpose of the treatment or procedure; the risks, benefits, and possible complications of the intervention; the alternatives, including doing  nothing; the risk(s) and benefit(s) of the alternative treatment(s) or procedure(s); and the risk(s) and benefit(s) of doing nothing. The patient was provided information about the general risks and possible complications associated with the procedure. These may include, but are not limited to: failure to achieve desired goals, infection, bleeding, organ or nerve damage, allergic reactions, paralysis, and death. In addition, the patient was informed of those risks and complications associated to the procedure, such as failure to decrease pain; infection; bleeding; organ or nerve damage with subsequent damage to sensory, motor, and/or autonomic systems, resulting in permanent pain, numbness, and/or weakness of one or several areas of the body; allergic reactions; (i.e.: anaphylactic reaction); and/or death. Furthermore, the patient was informed of those risks and complications associated with the medications. These include, but are not limited to: allergic reactions (i.e.: anaphylactic or anaphylactoid reaction(s)); adrenal axis suppression; blood sugar elevation that in diabetics may result in ketoacidosis or comma; water retention that in patients with history of congestive heart failure may result in shortness of breath, pulmonary edema, and decompensation with resultant heart failure; weight gain; swelling or edema; medication-induced neural toxicity; particulate matter embolism and blood vessel occlusion with resultant organ, and/or nervous system infarction; and/or aseptic necrosis of one or more  joints. Finally, the patient was informed that Medicine is not an exact science; therefore, there is also the possibility of unforeseen or unpredictable risks and/or possible complications that may result in a catastrophic outcome. The patient indicated having understood very clearly. We have given the patient no guarantees and we have made no promises. Enough time was given to the patient to ask questions, all of which were answered to the patient's satisfaction. Susan Houston has indicated that she wanted to continue with the procedure. Attestation: I, the ordering provider, attest that I have discussed with the patient the benefits, risks, side-effects, alternatives, likelihood of achieving goals, and potential problems during recovery for the procedure that I have provided informed consent. Date  Time: 11/15/2018  8:30 AM  Pre-Procedure Preparation:  Monitoring: As per clinic protocol. Respiration, ETCO2, SpO2, BP, heart rate and rhythm monitor placed and checked for adequate function Safety Precautions: Patient was assessed for positional comfort and pressure points before starting the procedure. Time-out: I initiated and conducted the "Time-out" before starting the procedure, as per protocol. The patient was asked to participate by confirming the accuracy of the "Time Out" information. Verification of the correct person, site, and procedure were performed and confirmed by me, the nursing staff, and the patient. "Time-out" conducted as per Joint Commission's Universal Protocol (UP.01.01.01). Time: 5329  Description of Procedure #1:  Target Area: The sub-costal neurovascular bundle area Approach: Sub-costal approach Area Prepped: Entire Postero-lateral Thoracic Region Prepping solution: ChloraPrep (2% chlorhexidine gluconate and 70% isopropyl alcohol) Safety Precautions: Aspiration looking for blood return was conducted prior to all injections. At no point did we inject any substances, as a needle was  being advanced. No attempts were made at seeking any paresthesias. Safe injection practices and needle disposal techniques used. Medications properly checked for expiration dates. SDV (single dose vial) medications used. Description of the Procedure: Protocol guidelines were followed. The patient was placed in position over the procedure table. The target area was identified and the area prepped in the usual manner. Skin & deeper tissues infiltrated with local anesthetic. After cleaning the skin with an antiseptic solution, 1-2 mL of dilute local anesthetic was infiltrated subcutaneously at the planned injection site. The fingers of the palpating hand were used to  straddle the insertion site at the inferior border of the rib and fix the skin to avoid unwanted skin movement. Appropriate amount of time allowed to pass for local anesthetics to take effect. The procedure needles were then advanced to the target area at an angle of approximately 20 cephalad to the skin. Contact with the rib was made. While maintaining the same angle of insertion, the needle was walked off the inferior border of the rib as the skin was allowed to return to its initial position. Then the needle was advanced no more than 3 mm below the inferior margin of the rib. Proper needle placement was secured. Negative aspiration confirmed. Following negative aspiration for blood or air, 3-5 mL of local anesthetic was injected. The solution injected in intermittent fashion, asking for systemic symptoms every 0.5cc of injectate. The needle was then removed and the area cleansed, making sure to leave some of the prepping solution back to take advantage of its long term bactericidal properties. Vitals:   11/15/18 0957 11/15/18 1007 11/15/18 1017 11/15/18 1027  BP: 131/68 126/60 (!) 124/59 128/60  Pulse:      Resp: 15 16 15 15   Temp:  97.9 F (36.6 C)  97.6 F (36.4 C)  TempSrc:      SpO2: 98% 98% 98% 99%  Weight:      Height:        Start  Time: 0938 hrs. End Time: 0957 hrs.   Materials:  Needle(s) Type: Regular needle Gauge: 25G Length: 1.5-in Medication(s): Please see orders for medications and dosing details.  Imaging Guidance (Non-Spinal) for procedure #1:  Type of Imaging Technique: Fluoroscopy Guidance (Non-Spinal) Indication(s): Assistance in needle guidance and placement for procedures requiring needle placement in or near specific anatomical locations not easily accessible without such assistance. Exposure Time: Please see nurses notes. Contrast: Before injecting any contrast, we confirmed that the patient did not have an allergy to iodine, shellfish, or radiological contrast. Once satisfactory needle placement was completed at the desired level, radiological contrast was injected. Contrast injected under live fluoroscopy. No contrast complications. See chart for type and volume of contrast used. Fluoroscopic Guidance: I was personally present during the use of fluoroscopy. "Tunnel Vision Technique" used to obtain the best possible view of the target area. Parallax error corrected before commencing the procedure. "Direction-depth-direction" technique used to introduce the needle under continuous pulsed fluoroscopy. Once target was reached, antero-posterior, oblique, and lateral fluoroscopic projection used confirm needle placement in all planes. Images permanently stored in EMR. Interpretation: I personally interpreted the imaging intraoperatively. Adequate needle placement confirmed in multiple planes. Appropriate spread of contrast into desired area was observed. No evidence of afferent or efferent intravascular uptake. Permanent images saved into the patient's record.  Description of Procedure #2:  Area Prepped: Anterior chest area over the sternum Prepping solution: ChloraPrep (2% chlorhexidine gluconate and 70% isopropyl alcohol) Safety Precautions: Aspiration looking for blood return was conducted prior to all  injections. At no point did we inject any substances, as a needle was being advanced. No attempts were made at seeking any paresthesias. Safe injection practices and needle disposal techniques used. Medications properly checked for expiration dates. SDV (single dose vial) medications used. Description of the Procedure: Protocol guidelines were followed. The patient was placed in position. The target area was identified and prepped in the usual manner. Skin & deeper tissues infiltrated with local anesthetic. Appropriate time provided for local anesthetics to take effect. The procedure needle was slowly advanced to target area. Proper  needle placement secured. Negative aspiration confirmed. Solution injected in intermittent fashion, asking for systemic symptoms every 0.5cc. Needle(s) removed and area cleaned, making sure to leave some prepping solution back to take advantage of its long term bactericidal properties.  Vitals:   11/15/18 0957 11/15/18 1007 11/15/18 1017 11/15/18 1027  BP: 131/68 126/60 (!) 124/59 128/60  Pulse:      Resp: 15 16 15 15   Temp:  97.9 F (36.6 C)  97.6 F (36.4 C)  TempSrc:      SpO2: 98% 98% 98% 99%  Weight:      Height:        Start Time: 0938 hrs. End Time: 0957 hrs. Materials:  Needle(s) Type: Epidural needle Gauge: 25G Length: 1.5-in Medication(s): Please see orders for medications and dosing details.  Imaging Guidance for procedure #2:  Type of Imaging Technique: None used Indication(s): N/A Exposure Time: No patient exposure Contrast: None used. Fluoroscopic Guidance: N/A Ultrasound Guidance: N/A Interpretation: N/A  Post-operative Assessment:  Post-procedure Vital Signs:  Pulse/HCG Rate: 8273 Temp: 97.6 F (36.4 C) Resp: 15 BP: 128/60 SpO2: 99 %  EBL: None  Complications: No immediate post-treatment complications observed by team, or reported by patient.  Note: The patient tolerated the entire procedure well. A repeat set of vitals were  taken after the procedure and the patient was kept under observation following institutional policy, for this type of procedure. Post-procedural neurological assessment was performed, showing return to baseline, prior to discharge. The patient was provided with post-procedure discharge instructions, including a section on how to identify potential problems. Should any problems arise concerning this procedure, the patient was given instructions to immediately contact us, at any time, without hesitation. In any case, we plan to contact the patient by telephone for a follow-up status report regarding this interventional procedure.  Comments:  No additional relevant information.  Plan of Care   Imaging Orders     DG C-Arm 1-60 Min-No Report  Procedure Orders     INTERCOSTAL NERVE BLOCK     Injection tendon or ligament  Medications ordered for procedure: Meds ordered this encounter  Medications  . lidocaine (XYLOCAINE) 2 % (with pres) injection 400 mg  . midazolam (VERSED) 5 MG/5ML injection 1-2 mg    Make sure Flumazenil is available in the pyxis when using this medication. If oversedation occurs, administer 0.2 mg IV over 15 sec. If after 45 sec no response, administer 0.2 mg again over 1 min; may repeat at 1 min intervals; not to exceed 4 doses (1 mg)  . fentaNYL (SUBLIMAZE) injection 25-50 mcg    Make sure Narcan is available in the pyxis when using this medication. In the event of respiratory depression (RR< 8/min): Titrate NARCAN (naloxone) in increments of 0.1 to 0.2 mg IV at 2-3 minute intervals, until desired degree of reversal.  . lactated ringers infusion 1,000 mL  . ropivacaine (PF) 2 mg/mL (0.2%) (NAROPIN) injection 9 mL  . dexamethasone (DECADRON) injection 10 mg  . ropivacaine (PF) 2 mg/mL (0.2%) (NAROPIN) injection 4 mL  . methylPREDNISolone acetate (DEPO-MEDROL) injection 80 mg   Medications administered: We administered lidocaine, midazolam, fentaNYL, lactated ringers,  ropivacaine (PF) 2 mg/mL (0.2%), dexamethasone, ropivacaine (PF) 2 mg/mL (0.2%), and methylPREDNISolone acetate.  See the medical record for exact dosing, route, and time of administration.  Disposition: Discharge home  Discharge Date & Time: 11/15/2018; 1027 hrs.   Physician-requested Follow-up: Return for post-procedure eval (2 wks), w/ Dr. Dossie Arbour.  Future Appointments  Date Time Provider  [place]  11/17/2018 11:00 AM Alden Hipp, LCSW [facility] None  11/21/2018  3:30 PM Valerie Roys, DO [facility]  11/30/2018  1:15 PM Milinda Pointer, MD [facility] None  12/06/2018  1:30 PM Jonathon Bellows, MD [facility] None  12/12/2018 11:15 AM Ursula Alert, MD [facility] None  05/08/2019  9:30 AM [facility]  05/09/2019  2:00 PM [facility] None  05/09/2019  2:30 PM Cammie Sickle, MD [facility] None   Primary Care Physician: Valerie Roys, DO Location: [facility] Note by: Gaspar Cola, MD Date: 11/15/2018; Time: 10:43 AM  Disclaimer:  Medicine is not an Chief Strategy Officer. The only guarantee in medicine is that nothing is guaranteed. It is important to note that the decision to proceed with this intervention was based on the information collected from the patient. The Data and conclusions were drawn from the patient's questionnaire, the interview, and the physical examination. Because the information was provided in large part by the patient, it cannot be guaranteed that it has not been purposely or unconsciously manipulated. Every effort has been made to obtain as much relevant data as possible for this evaluation. It is important to note that the conclusions that lead to this procedure are derived in large part from the available data. Always take into account that the treatment will also be dependent on availability of resources and existing treatment guidelines, considered by other Pain Management Practitioners as being  common knowledge and practice, at the time of the intervention. For Medico-Legal purposes, it is also important to point out that variation in procedural techniques and pharmacological choices are the acceptable norm. The indications, contraindications, technique, and results of the above procedure should only be interpreted and judged by a Board-Certified Interventional Pain Specialist with extensive familiarity and expertise in the same exact procedure and technique.

## 2018-11-16 ENCOUNTER — Telehealth: Payer: Self-pay

## 2018-11-16 DIAGNOSIS — F5105 Insomnia due to other mental disorder: Secondary | ICD-10-CM

## 2018-11-16 NOTE — Telephone Encounter (Signed)
Post procedure phone call.  Patient states she is doing well.  

## 2018-11-16 NOTE — Telephone Encounter (Signed)
Medication problem - Patient left a message requesting Dr. Shea Evans increase her Belsomra to 15 mg at bedtime, 1.5 tablets at night.  States the 10 mg dosage "is not doing any good" and continued problems with sleep.

## 2018-11-17 ENCOUNTER — Other Ambulatory Visit: Payer: Self-pay | Admitting: Family Medicine

## 2018-11-17 ENCOUNTER — Ambulatory Visit (INDEPENDENT_AMBULATORY_CARE_PROVIDER_SITE_OTHER): Payer: Medicare HMO | Admitting: Licensed Clinical Social Worker

## 2018-11-17 ENCOUNTER — Encounter: Payer: Self-pay | Admitting: Licensed Clinical Social Worker

## 2018-11-17 DIAGNOSIS — F331 Major depressive disorder, recurrent, moderate: Secondary | ICD-10-CM

## 2018-11-17 MED ORDER — GUAIFENESIN-CODEINE 100-10 MG/5ML PO SOLN: 10.0000 mL | Freq: Three times a day (TID) | ORAL | 0 refills | Status: DC | PRN

## 2018-11-17 MED ORDER — SUVOREXANT 15 MG PO TABS: 15.0000 mg | ORAL_TABLET | Freq: Every day | ORAL | 0 refills | Status: DC

## 2018-11-17 NOTE — Telephone Encounter (Signed)
Patient called, left VM to return call to the office to discuss the refill request. This was prescribed by another provider on 11/12/18. Does the patient need a refill already? She has upcoming appointment on Monday, 11/21/18 at 1530 with Dr. Wynetta Emery.
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
## 2018-11-17 NOTE — Telephone Encounter (Signed)
Patient called, left VM to return call to the office to discuss the refill request of cough syrup.

## 2018-11-17 NOTE — Telephone Encounter (Signed)
Patient returned call and I asked is she needing the refill of guafenesin-codeine cough syrup. She says yes, because she took her last dose yesterday. She says she's been taking it 3 x day as prescribed when she went to the ED on 11/12/18 and she's still sick. I advised she will need to come into the office to be seen by a provider, since she was seen by someone else. Appointment scheduled for tomorrow at 1515 with Merrie Roof, [place].

## 2018-11-17 NOTE — Telephone Encounter (Signed)
Copied from [place] [id number]. Topic: Quick Communication - See Telephone Encounter >> Nov 17, 2018  2:08 PM Ivar Drape wrote: CRM for notification. See Telephone encounter for: 11/17/18. Patient would like a refill on her guaiFENesin-codeine 100-10 MG/5ML syrup medication and have it sent to her preferred pharmacy [facility] in [place].

## 2018-11-17 NOTE — Telephone Encounter (Signed)
Called patient , sent Belsomra 15 mg to [facility] . She will call back in a week if continued sleep issues .

## 2018-11-18 ENCOUNTER — Ambulatory Visit: Payer: Self-pay | Admitting: Family Medicine

## 2018-11-21 ENCOUNTER — Ambulatory Visit: Payer: Medicare HMO | Admitting: Family Medicine

## 2018-11-23 DIAGNOSIS — C3490 Malignant neoplasm of unspecified part of unspecified bronchus or lung: Secondary | ICD-10-CM | POA: Diagnosis not present

## 2018-11-24 ENCOUNTER — Other Ambulatory Visit: Payer: Self-pay

## 2018-11-24 ENCOUNTER — Encounter
Admission: RE | Admit: 2018-11-24 | Discharge: 2018-11-24 | Disposition: A | Discharge status: Home / Self Care | Payer: Medicare Other | Source: Ambulatory Visit | Attending: Orthopedic Surgery | Admitting: Orthopedic Surgery

## 2018-11-24 DIAGNOSIS — Z01812 Encounter for preprocedural laboratory examination: Secondary | ICD-10-CM | POA: Diagnosis not present

## 2018-11-24 LAB — CBC
HCT: 39.6 % (ref 36.0–46.0)
Hemoglobin: 12.1 g/dL (ref 12.0–15.0)
MCH: 26.9 pg (ref 26.0–34.0)
MCHC: 30.6 g/dL (ref 30.0–36.0)
MCV: 88.2 fL (ref 80.0–100.0)
Platelets: 231 10*3/uL (ref 150–400)
RBC: 4.49 MIL/uL (ref 3.87–5.11)
RDW: 14.1 % (ref 11.5–15.5)
WBC: 8.5 10*3/uL (ref 4.0–10.5)
nRBC: 0 % (ref 0.0–0.2)

## 2018-11-24 LAB — BASIC METABOLIC PANEL
Anion gap: 7 (ref 5–15)
BUN: 16 mg/dL (ref 6–20)
CO2: 31 mmol/L (ref 22–32)
Calcium: 8.7 mg/dL — ABNORMAL LOW (ref 8.9–10.3)
Chloride: 102 mmol/L (ref 98–111)
Creatinine, Ser: 0.87 mg/dL (ref 0.44–1.00)
GFR calc Af Amer: >60 mL/min (ref >60)
GFR calc non Af Amer: >60 mL/min (ref >60)
GLUCOSE: 91 mg/dL (ref 70–99)
Potassium: 4 mmol/L (ref 3.5–5.1)
Sodium: 140 mmol/L (ref 135–145)

## 2018-11-24 LAB — URINALYSIS, COMPLETE (UACMP) WITH MICROSCOPIC
BACTERIA UA: NONE SEEN
Bilirubin Urine: NEGATIVE
Glucose, UA: NEGATIVE mg/dL
Ketones, ur: NEGATIVE mg/dL
Nitrite: NEGATIVE
Protein, ur: NEGATIVE mg/dL
Specific Gravity, Urine: 1.019 (ref 1.005–1.030)
pH: 5 (ref 5.0–8.0)

## 2018-11-24 LAB — APTT: aPTT: 29 seconds (ref 24–36)

## 2018-11-24 LAB — SURGICAL PCR SCREEN
MRSA, PCR: NEGATIVE
Staphylococcus aureus: NEGATIVE

## 2018-11-24 LAB — PROTIME-INR
INR: 1.08
Prothrombin Time: 13.9 seconds (ref 11.4–15.2)

## 2018-11-24 LAB — SEDIMENTATION RATE: Sed Rate: 41 mm/hr — ABNORMAL HIGH (ref 0–30)

## 2018-11-24 NOTE — Patient Instructions (Signed)
Your procedure is scheduled on: 11/29/18 Tues Report to Same Day Surgery 2nd floor medical mall [facility] Entrance-take elevator on left to 2nd floor.  Check in with surgery information desk.) To find out your arrival time please call [phone] between 1PM - 3PM on 11/28/18 Mon  Remember: Instructions that are not followed completely may result in serious medical risk, up to and including death, or upon the discretion of your surgeon and anesthesiologist your surgery may need to be rescheduled.    _x___ 1. Do not eat food after midnight the night before your procedure. You may drink clear liquids up to 2 hours before you are scheduled to arrive at the hospital for your procedure.  Do not drink clear liquids within 2 hours of your scheduled arrival to the hospital.  Clear liquids include  --Water or Apple juice without pulp  --Clear carbohydrate beverage such as ClearFast or Gatorade  --Black Coffee or Clear Tea (No milk, no creamers, do not add anything to                  the coffee or Tea Type 1 and type 2 diabetics should only drink water.   ____Ensure clear carbohydrate drink on the way to the hospital for bariatric patients  ____Ensure clear carbohydrate drink 3 hours before surgery for Dr Dwyane Luo patients if physician instructed.   No gum chewing or hard candies.     __x__ 2. No Alcohol for 24 hours before or after surgery.   __x__3. No Smoking or e-cigarettes for 24 prior to surgery.  Do not use any chewable tobacco products for at least 6 hour prior to surgery   ____  4. Bring all medications with you on the day of surgery if instructed.    __x__ 5. Notify your doctor if there is any change in your medical condition     (cold, fever, infections).    x___6. On the morning of surgery brush your teeth with toothpaste and water.  You may rinse your mouth with mouth wash if you wish.  Do not swallow any toothpaste or mouthwash.   Do not wear jewelry, make-up, hairpins,  clips or nail polish.  Do not wear lotions, powders, or perfumes. You may wear deodorant.  Do not shave 48 hours prior to surgery. Men may shave face and neck.  Do not bring valuables to the hospital.    [facility] is not responsible for any belongings or valuables.               Contacts, dentures or bridgework may not be worn into surgery.  Leave your suitcase in the car. After surgery it may be brought to your room.  For patients admitted to the hospital, discharge time is determined by your                       treatment team.  _  Patients discharged the day of surgery will not be allowed to drive home.  You will need someone to drive you home and stay with you the night of your procedure.    Please read over the following fact sheets that you were given:   [facility] Preparing for Surgery and or MRSA Information   _x___ Take anti-hypertensive listed below, cardiac, seizure, asthma,     anti-reflux and psychiatric medicines. These include:  1. albuterol (PROVENTIL HFA;VENTOLIN HFA) 108 (90 Base) MCG/ACT inhaler  2.FLUoxetine (PROZAC) 20 MG capsule  3.gabapentin (NEURONTIN) 300  MG capsule  4.  5.  6.  ____Fleets enema or Magnesium Citrate as directed.   _x___ Use CHG Soap or sage wipes as directed on instruction sheet   ____ Use inhalers on the day of surgery and bring to hospital day of surgery  ____ Stop Metformin and Janumet 2 days prior to surgery.    ____ Take 1/2 of usual insulin dose the night before surgery and none on the morning     surgery.   _x___ Follow recommendations from Cardiologist, Pulmonologist or PCP regarding          stopping Aspirin, Coumadin, Plavix ,Eliquis, Effient, or Pradaxa, and Pletal.  X____Stop Anti-inflammatories such as Advil, Aleve, Ibuprofen, Motrin, Naproxen, Naprosyn, Goodies powders or aspirin products. OK to take Tylenol and                          Celebrex.   _x___ Stop supplements until after surgery.  But may continue Vitamin D,  Vitamin B,       and multivitamin.   ____ Bring C-Pap to the hospital.

## 2018-11-25 NOTE — Pre-Procedure Instructions (Signed)
Sed rate results sent to Dr. Rudene Christians and Anesthesia for review.

## 2018-11-26 LAB — TYPE AND SCREEN
ABO/RH(D): B NEG
Antibody Screen: NEGATIVE

## 2018-11-26 LAB — URINE CULTURE: Culture: >=100000 — AB

## 2018-11-28 DIAGNOSIS — C3490 Malignant neoplasm of unspecified part of unspecified bronchus or lung: Secondary | ICD-10-CM | POA: Diagnosis not present

## 2018-11-28 MED ORDER — CEFAZOLIN SODIUM-DEXTROSE 2-4 GM/100ML-% IV SOLN
2.0000 g | Freq: Once | INTRAVENOUS | Status: AC
  Administered 2018-11-29: 2 g via INTRAVENOUS

## 2018-11-29 ENCOUNTER — Other Ambulatory Visit: Payer: Self-pay

## 2018-11-29 ENCOUNTER — Inpatient Hospital Stay: Payer: Medicare Other

## 2018-11-29 ENCOUNTER — Encounter
Admission: RE | Disposition: A | Discharge status: Home Health | Payer: Self-pay | Source: Home / Self Care | Attending: Orthopedic Surgery

## 2018-11-29 ENCOUNTER — Inpatient Hospital Stay: Payer: Medicare Other | Admitting: Certified Registered"

## 2018-11-29 ENCOUNTER — Inpatient Hospital Stay
Admission: RE | Admit: 2018-11-29 | Discharge: 2018-12-02 | DRG: 468 | Disposition: A | Discharge status: Home Health | Payer: Medicare Other | Attending: Orthopedic Surgery | Admitting: Orthopedic Surgery

## 2018-11-29 DIAGNOSIS — F419 Anxiety disorder, unspecified: Secondary | ICD-10-CM | POA: Diagnosis present

## 2018-11-29 DIAGNOSIS — Y838 Other surgical procedures as the cause of abnormal reaction of the patient, or of later complication, without mention of misadventure at the time of the procedure: Secondary | ICD-10-CM | POA: Diagnosis present

## 2018-11-29 DIAGNOSIS — Z923 Personal history of irradiation: Secondary | ICD-10-CM

## 2018-11-29 DIAGNOSIS — F329 Major depressive disorder, single episode, unspecified: Secondary | ICD-10-CM | POA: Diagnosis present

## 2018-11-29 DIAGNOSIS — Z8614 Personal history of Methicillin resistant Staphylococcus aureus infection: Secondary | ICD-10-CM

## 2018-11-29 DIAGNOSIS — E785 Hyperlipidemia, unspecified: Secondary | ICD-10-CM | POA: Diagnosis not present

## 2018-11-29 DIAGNOSIS — Z87891 Personal history of nicotine dependence: Secondary | ICD-10-CM | POA: Diagnosis not present

## 2018-11-29 DIAGNOSIS — Z87442 Personal history of urinary calculi: Secondary | ICD-10-CM | POA: Diagnosis not present

## 2018-11-29 DIAGNOSIS — Z8521 Personal history of malignant neoplasm of larynx: Secondary | ICD-10-CM

## 2018-11-29 DIAGNOSIS — G4700 Insomnia, unspecified: Secondary | ICD-10-CM | POA: Diagnosis not present

## 2018-11-29 DIAGNOSIS — J449 Chronic obstructive pulmonary disease, unspecified: Secondary | ICD-10-CM | POA: Diagnosis present

## 2018-11-29 DIAGNOSIS — Z471 Aftercare following joint replacement surgery: Secondary | ICD-10-CM | POA: Diagnosis not present

## 2018-11-29 DIAGNOSIS — M25362 Other instability, left knee: Secondary | ICD-10-CM | POA: Diagnosis not present

## 2018-11-29 DIAGNOSIS — I10 Essential (primary) hypertension: Secondary | ICD-10-CM | POA: Diagnosis not present

## 2018-11-29 DIAGNOSIS — Z91048 Other nonmedicinal substance allergy status: Secondary | ICD-10-CM

## 2018-11-29 DIAGNOSIS — Z4789 Encounter for other orthopedic aftercare: Secondary | ICD-10-CM | POA: Diagnosis not present

## 2018-11-29 DIAGNOSIS — Z9221 Personal history of antineoplastic chemotherapy: Secondary | ICD-10-CM | POA: Diagnosis not present

## 2018-11-29 DIAGNOSIS — Z7982 Long term (current) use of aspirin: Secondary | ICD-10-CM

## 2018-11-29 DIAGNOSIS — T8484XA Pain due to internal orthopedic prosthetic devices, implants and grafts, initial encounter: Secondary | ICD-10-CM | POA: Diagnosis not present

## 2018-11-29 DIAGNOSIS — Z885 Allergy status to narcotic agent status: Secondary | ICD-10-CM

## 2018-11-29 DIAGNOSIS — M25562 Pain in left knee: Secondary | ICD-10-CM | POA: Diagnosis not present

## 2018-11-29 DIAGNOSIS — Z888 Allergy status to other drugs, medicaments and biological substances status: Secondary | ICD-10-CM

## 2018-11-29 DIAGNOSIS — K5900 Constipation, unspecified: Secondary | ICD-10-CM | POA: Diagnosis not present

## 2018-11-29 DIAGNOSIS — Z96652 Presence of left artificial knee joint: Secondary | ICD-10-CM | POA: Diagnosis present

## 2018-11-29 DIAGNOSIS — R2681 Unsteadiness on feet: Secondary | ICD-10-CM | POA: Diagnosis not present

## 2018-11-29 DIAGNOSIS — J441 Chronic obstructive pulmonary disease with (acute) exacerbation: Secondary | ICD-10-CM | POA: Diagnosis not present

## 2018-11-29 DIAGNOSIS — Z7952 Long term (current) use of systemic steroids: Secondary | ICD-10-CM | POA: Diagnosis not present

## 2018-11-29 DIAGNOSIS — G3184 Mild cognitive impairment, so stated: Secondary | ICD-10-CM | POA: Diagnosis present

## 2018-11-29 DIAGNOSIS — Z79891 Long term (current) use of opiate analgesic: Secondary | ICD-10-CM

## 2018-11-29 DIAGNOSIS — M6281 Muscle weakness (generalized): Secondary | ICD-10-CM | POA: Diagnosis not present

## 2018-11-29 DIAGNOSIS — G8918 Other acute postprocedural pain: Secondary | ICD-10-CM

## 2018-11-29 DIAGNOSIS — I959 Hypotension, unspecified: Secondary | ICD-10-CM | POA: Diagnosis not present

## 2018-11-29 DIAGNOSIS — T84023A Instability of internal left knee prosthesis, initial encounter: Secondary | ICD-10-CM | POA: Diagnosis not present

## 2018-11-29 DIAGNOSIS — T84093D Other mechanical complication of internal left knee prosthesis, subsequent encounter: Secondary | ICD-10-CM | POA: Diagnosis not present

## 2018-11-29 DIAGNOSIS — Z85118 Personal history of other malignant neoplasm of bronchus and lung: Secondary | ICD-10-CM

## 2018-11-29 DIAGNOSIS — Z7401 Bed confinement status: Secondary | ICD-10-CM | POA: Diagnosis not present

## 2018-11-29 DIAGNOSIS — R52 Pain, unspecified: Secondary | ICD-10-CM | POA: Diagnosis not present

## 2018-11-29 DIAGNOSIS — Z79899 Other long term (current) drug therapy: Secondary | ICD-10-CM | POA: Diagnosis not present

## 2018-11-29 DIAGNOSIS — M2550 Pain in unspecified joint: Secondary | ICD-10-CM | POA: Diagnosis not present

## 2018-11-29 HISTORY — PX: TOTAL KNEE REVISION: SHX996

## 2018-11-29 LAB — CBC
HCT: 34.8 % — ABNORMAL LOW (ref 36.0–46.0)
Hemoglobin: 10.6 g/dL — ABNORMAL LOW (ref 12.0–15.0)
MCH: 26.9 pg (ref 26.0–34.0)
MCHC: 30.5 g/dL (ref 30.0–36.0)
MCV: 88.3 fL (ref 80.0–100.0)
Platelets: 184 10*3/uL (ref 150–400)
RBC: 3.94 MIL/uL (ref 3.87–5.11)
RDW: 14 % (ref 11.5–15.5)
WBC: 8.1 10*3/uL (ref 4.0–10.5)
nRBC: 0 % (ref 0.0–0.2)

## 2018-11-29 LAB — CREATININE, SERUM
Creatinine, Ser: 0.6 mg/dL (ref 0.44–1.00)
GFR calc Af Amer: >60 mL/min (ref >60)
GFR calc non Af Amer: >60 mL/min (ref >60)

## 2018-11-29 SURGERY — TOTAL KNEE REVISION
Anesthesia: Spinal | Site: Knee | Laterality: Left

## 2018-11-29 MED ORDER — PHENYLEPHRINE HCL 10 MG/ML IJ SOLN
INTRAMUSCULAR | Status: DC | PRN
  Administered 2018-11-29 (×5): 100 ug via INTRAVENOUS

## 2018-11-29 MED ORDER — ALBUTEROL SULFATE (2.5 MG/3ML) 0.083% IN NEBU: 2.5000 mg | INHALATION_SOLUTION | RESPIRATORY_TRACT | Status: DC | PRN

## 2018-11-29 MED ORDER — LIDOCAINE HCL (PF) 2 % IJ SOLN
INTRAMUSCULAR | Status: DC | PRN
  Administered 2018-11-29: 50 mg

## 2018-11-29 MED ORDER — EPINEPHRINE PF 1 MG/ML IJ SOLN
INTRAMUSCULAR | Status: AC
  Filled 2018-11-29: qty 1

## 2018-11-29 MED ORDER — ONDANSETRON HCL 4 MG/2ML IJ SOLN: 4.0000 mg | Freq: Four times a day (QID) | INTRAMUSCULAR | Status: DC | PRN

## 2018-11-29 MED ORDER — EPINEPHRINE PF 1 MG/ML IJ SOLN
INTRAMUSCULAR | Status: DC | PRN
  Administered 2018-11-29: .0001 mL via INTRATHECAL

## 2018-11-29 MED ORDER — METOCLOPRAMIDE HCL 10 MG PO TABS: 5.0000 mg | ORAL_TABLET | Freq: Three times a day (TID) | ORAL | Status: DC | PRN

## 2018-11-29 MED ORDER — TRAMADOL HCL 50 MG PO TABS
50.0000 mg | ORAL_TABLET | Freq: Four times a day (QID) | ORAL | Status: DC
  Administered 2018-11-29 – 2018-12-02 (×12): 50 mg via ORAL
  Filled 2018-11-29 (×12): qty 1

## 2018-11-29 MED ORDER — CEFAZOLIN SODIUM-DEXTROSE 2-4 GM/100ML-% IV SOLN
2.0000 g | Freq: Four times a day (QID) | INTRAVENOUS | Status: AC
  Administered 2018-11-29 – 2018-11-30 (×3): 2 g via INTRAVENOUS
  Filled 2018-11-29 (×3): qty 100

## 2018-11-29 MED ORDER — FENTANYL CITRATE (PF) 100 MCG/2ML IJ SOLN
INTRAMUSCULAR | Status: AC
  Filled 2018-11-29: qty 2

## 2018-11-29 MED ORDER — LIDOCAINE HCL (PF) 2 % IJ SOLN
INTRAMUSCULAR | Status: AC
  Filled 2018-11-29: qty 10

## 2018-11-29 MED ORDER — SUVOREXANT 20 MG PO TABS: 20.0000 mg | ORAL_TABLET | Freq: Every day | ORAL | Status: DC

## 2018-11-29 MED ORDER — SODIUM CHLORIDE 0.9 % IV BOLUS
500.0000 mL | Freq: Once | INTRAVENOUS | Status: AC
  Administered 2018-11-29: 500 mL via INTRAVENOUS

## 2018-11-29 MED ORDER — FENTANYL CITRATE (PF) 100 MCG/2ML IJ SOLN: 25.0000 ug | INTRAMUSCULAR | Status: DC | PRN

## 2018-11-29 MED ORDER — PHENYLEPHRINE HCL 10 MG/ML IJ SOLN
INTRAMUSCULAR | Status: AC
  Filled 2018-11-29: qty 1

## 2018-11-29 MED ORDER — FLUOXETINE HCL 20 MG PO CAPS
20.0000 mg | ORAL_CAPSULE | Freq: Every day | ORAL | Status: DC
  Administered 2018-11-30 – 2018-12-02 (×3): 20 mg via ORAL
  Filled 2018-11-29 (×3): qty 1

## 2018-11-29 MED ORDER — QUETIAPINE FUMARATE 25 MG PO TABS
25.0000 mg | ORAL_TABLET | Freq: Every day | ORAL | Status: DC
  Administered 2018-11-29 – 2018-12-01 (×3): 25 mg via ORAL
  Filled 2018-11-29 (×3): qty 1

## 2018-11-29 MED ORDER — METHOCARBAMOL 1000 MG/10ML IJ SOLN
500.0000 mg | Freq: Four times a day (QID) | INTRAVENOUS | Status: DC | PRN
  Filled 2018-11-29: qty 5

## 2018-11-29 MED ORDER — ACETAMINOPHEN 325 MG PO TABS: 325.0000 mg | ORAL_TABLET | Freq: Four times a day (QID) | ORAL | Status: DC | PRN

## 2018-11-29 MED ORDER — FENTANYL CITRATE (PF) 100 MCG/2ML IJ SOLN
INTRAMUSCULAR | Status: DC | PRN
  Administered 2018-11-29 (×2): 50 ug via INTRAVENOUS

## 2018-11-29 MED ORDER — CEFAZOLIN SODIUM-DEXTROSE 2-4 GM/100ML-% IV SOLN
INTRAVENOUS | Status: AC
  Filled 2018-11-29: qty 100

## 2018-11-29 MED ORDER — PHENOL 1.4 % MT LIQD
1.0000 | OROMUCOSAL | Status: DC | PRN
  Filled 2018-11-29: qty 177

## 2018-11-29 MED ORDER — DOCUSATE SODIUM 100 MG PO CAPS
100.0000 mg | ORAL_CAPSULE | Freq: Two times a day (BID) | ORAL | Status: DC
  Administered 2018-11-29 – 2018-12-02 (×6): 100 mg via ORAL
  Filled 2018-11-29 (×7): qty 1

## 2018-11-29 MED ORDER — MIDAZOLAM HCL 5 MG/5ML IJ SOLN
INTRAMUSCULAR | Status: DC | PRN
  Administered 2018-11-29 (×2): 2 mg via INTRAVENOUS

## 2018-11-29 MED ORDER — BUPIVACAINE HCL (PF) 0.25 % IJ SOLN
INTRAMUSCULAR | Status: AC
  Filled 2018-11-29: qty 30

## 2018-11-29 MED ORDER — MIDAZOLAM HCL 2 MG/2ML IJ SOLN
INTRAMUSCULAR | Status: AC
  Filled 2018-11-29: qty 2

## 2018-11-29 MED ORDER — GABAPENTIN 300 MG PO CAPS
300.0000 mg | ORAL_CAPSULE | Freq: Every day | ORAL | Status: DC
  Administered 2018-11-29 – 2018-12-01 (×3): 300 mg via ORAL
  Filled 2018-11-29 (×3): qty 1

## 2018-11-29 MED ORDER — BUPIVACAINE HCL (PF) 0.5 % IJ SOLN
INTRAMUSCULAR | Status: DC | PRN
  Administered 2018-11-29: 3 mL via INTRATHECAL

## 2018-11-29 MED ORDER — FAMOTIDINE 20 MG PO TABS
ORAL_TABLET | ORAL | Status: AC
  Administered 2018-11-29: 20 mg via ORAL
  Filled 2018-11-29: qty 1

## 2018-11-29 MED ORDER — KETAMINE HCL 50 MG/ML IJ SOLN
INTRAMUSCULAR | Status: AC
  Filled 2018-11-29: qty 10

## 2018-11-29 MED ORDER — HYDROCODONE-ACETAMINOPHEN 7.5-325 MG PO TABS
1.0000 | ORAL_TABLET | ORAL | Status: DC | PRN
  Administered 2018-11-29 – 2018-12-02 (×13): 1 via ORAL
  Filled 2018-11-29 (×15): qty 1

## 2018-11-29 MED ORDER — METOCLOPRAMIDE HCL 5 MG/ML IJ SOLN: 5.0000 mg | Freq: Three times a day (TID) | INTRAMUSCULAR | Status: DC | PRN

## 2018-11-29 MED ORDER — HYDROXYZINE PAMOATE 50 MG PO CAPS
50.0000 mg | ORAL_CAPSULE | Freq: Three times a day (TID) | ORAL | Status: DC | PRN
  Filled 2018-11-29: qty 1

## 2018-11-29 MED ORDER — METHOCARBAMOL 500 MG PO TABS
500.0000 mg | ORAL_TABLET | Freq: Four times a day (QID) | ORAL | Status: DC | PRN
  Administered 2018-11-29 – 2018-12-02 (×7): 500 mg via ORAL
  Filled 2018-11-29 (×7): qty 1

## 2018-11-29 MED ORDER — KETAMINE HCL 50 MG/ML IJ SOLN
INTRAMUSCULAR | Status: DC | PRN
  Administered 2018-11-29: 50 mg via INTRAVENOUS

## 2018-11-29 MED ORDER — MORPHINE SULFATE (PF) 10 MG/ML IV SOLN
INTRAVENOUS | Status: AC
  Filled 2018-11-29: qty 1

## 2018-11-29 MED ORDER — BUPIVACAINE HCL (PF) 0.5 % IJ SOLN
INTRAMUSCULAR | Status: AC
  Filled 2018-11-29: qty 10

## 2018-11-29 MED ORDER — LACTATED RINGERS IV SOLN
INTRAVENOUS | Status: DC
  Administered 2018-11-29 (×2): via INTRAVENOUS

## 2018-11-29 MED ORDER — MENTHOL 3 MG MT LOZG
1.0000 | LOZENGE | OROMUCOSAL | Status: DC | PRN
  Filled 2018-11-29: qty 9

## 2018-11-29 MED ORDER — MAGNESIUM HYDROXIDE 400 MG/5ML PO SUSP
30.0000 mL | Freq: Every day | ORAL | Status: DC | PRN
  Administered 2018-12-02: 30 mL via ORAL
  Filled 2018-11-29: qty 30

## 2018-11-29 MED ORDER — GABAPENTIN 300 MG PO CAPS: 300.0000 mg | ORAL_CAPSULE | Freq: Two times a day (BID) | ORAL | Status: DC

## 2018-11-29 MED ORDER — ENOXAPARIN SODIUM 30 MG/0.3ML ~~LOC~~ SOLN
30.0000 mg | Freq: Two times a day (BID) | SUBCUTANEOUS | Status: DC
  Administered 2018-11-30 – 2018-12-02 (×5): 30 mg via SUBCUTANEOUS
  Filled 2018-11-29 (×5): qty 0.3

## 2018-11-29 MED ORDER — ASPIRIN EC 81 MG PO TBEC
81.0000 mg | DELAYED_RELEASE_TABLET | Freq: Every day | ORAL | Status: DC
  Administered 2018-11-29 – 2018-12-02 (×4): 81 mg via ORAL
  Filled 2018-11-29 (×3): qty 1

## 2018-11-29 MED ORDER — HYDROMORPHONE HCL 1 MG/ML IJ SOLN
0.5000 mg | INTRAMUSCULAR | Status: DC | PRN
  Administered 2018-11-29 (×2): 0.5 mg via INTRAVENOUS
  Filled 2018-11-29 (×2): qty 1

## 2018-11-29 MED ORDER — OXYCODONE HCL 5 MG PO TABS: 10.0000 mg | ORAL_TABLET | ORAL | Status: DC | PRN

## 2018-11-29 MED ORDER — GABAPENTIN 300 MG PO CAPS
600.0000 mg | ORAL_CAPSULE | Freq: Every morning | ORAL | Status: DC
  Administered 2018-11-30 – 2018-12-02 (×3): 600 mg via ORAL
  Filled 2018-11-29 (×3): qty 2

## 2018-11-29 MED ORDER — SODIUM CHLORIDE FLUSH 0.9 % IV SOLN
INTRAVENOUS | Status: AC
  Filled 2018-11-29: qty 10

## 2018-11-29 MED ORDER — KETOROLAC TROMETHAMINE 15 MG/ML IJ SOLN
15.0000 mg | Freq: Once | INTRAMUSCULAR | Status: AC
  Administered 2018-11-29: 15 mg via INTRAVENOUS
  Filled 2018-11-29: qty 1

## 2018-11-29 MED ORDER — GLYCOPYRROLATE 0.2 MG/ML IJ SOLN
INTRAMUSCULAR | Status: DC | PRN
  Administered 2018-11-29: 0.2 mg via INTRAVENOUS

## 2018-11-29 MED ORDER — BISACODYL 5 MG PO TBEC
5.0000 mg | DELAYED_RELEASE_TABLET | Freq: Every day | ORAL | Status: DC | PRN
  Administered 2018-12-01: 5 mg via ORAL
  Filled 2018-11-29: qty 1

## 2018-11-29 MED ORDER — PROPOFOL 10 MG/ML IV BOLUS
INTRAVENOUS | Status: AC
  Filled 2018-11-29: qty 20

## 2018-11-29 MED ORDER — FAMOTIDINE 20 MG PO TABS
20.0000 mg | ORAL_TABLET | Freq: Once | ORAL | Status: AC
  Administered 2018-11-29: 20 mg via ORAL

## 2018-11-29 MED ORDER — ACETAMINOPHEN 500 MG PO TABS
1000.0000 mg | ORAL_TABLET | Freq: Four times a day (QID) | ORAL | Status: AC
  Administered 2018-11-29 – 2018-11-30 (×4): 1000 mg via ORAL
  Filled 2018-11-29 (×4): qty 2

## 2018-11-29 MED ORDER — OXYCODONE HCL 5 MG PO TABS
5.0000 mg | ORAL_TABLET | ORAL | Status: DC | PRN
  Administered 2018-11-29: 5 mg via ORAL
  Filled 2018-11-29: qty 1

## 2018-11-29 MED ORDER — ONDANSETRON HCL 4 MG/2ML IJ SOLN: 4.0000 mg | Freq: Once | INTRAMUSCULAR | Status: DC | PRN

## 2018-11-29 MED ORDER — CLONAZEPAM 1 MG PO TABS
1.0000 mg | ORAL_TABLET | Freq: Every day | ORAL | Status: DC
  Administered 2018-11-29 – 2018-12-01 (×3): 1 mg via ORAL
  Filled 2018-11-29 (×3): qty 1

## 2018-11-29 MED ORDER — SODIUM CHLORIDE 0.9 % IV SOLN
INTRAVENOUS | Status: DC | PRN
  Administered 2018-11-29: 3500 mL

## 2018-11-29 MED ORDER — ONDANSETRON HCL 4 MG PO TABS: 4.0000 mg | ORAL_TABLET | Freq: Four times a day (QID) | ORAL | Status: DC | PRN

## 2018-11-29 MED ORDER — SODIUM CHLORIDE 0.9 % IV SOLN
INTRAVENOUS | Status: DC
  Administered 2018-11-29 – 2018-11-30 (×3): via INTRAVENOUS

## 2018-11-29 MED ORDER — HYDROXYZINE HCL 50 MG PO TABS
50.0000 mg | ORAL_TABLET | Freq: Three times a day (TID) | ORAL | Status: DC | PRN
  Administered 2018-11-30: 50 mg via ORAL
  Filled 2018-11-29 (×2): qty 1

## 2018-11-29 MED ORDER — ALUM & MAG HYDROXIDE-SIMETH 200-200-20 MG/5ML PO SUSP: 30.0000 mL | ORAL | Status: DC | PRN

## 2018-11-29 MED ORDER — GLYCOPYRROLATE 0.2 MG/ML IJ SOLN
INTRAMUSCULAR | Status: AC
  Filled 2018-11-29: qty 1

## 2018-11-29 MED ORDER — ZOLPIDEM TARTRATE 5 MG PO TABS
5.0000 mg | ORAL_TABLET | Freq: Every evening | ORAL | Status: DC | PRN
  Administered 2018-11-29 – 2018-12-01 (×3): 5 mg via ORAL
  Filled 2018-11-29 (×3): qty 1

## 2018-11-29 MED ORDER — BUPIVACAINE LIPOSOME 1.3 % IJ SUSP
INTRAMUSCULAR | Status: AC
  Filled 2018-11-29: qty 20

## 2018-11-29 MED ORDER — PROPOFOL 500 MG/50ML IV EMUL
INTRAVENOUS | Status: DC | PRN
  Administered 2018-11-29: 50 ug/kg/min via INTRAVENOUS

## 2018-11-29 MED ORDER — SODIUM CHLORIDE 0.9 % IV SOLN
INTRAVENOUS | Status: DC | PRN
  Administered 2018-11-29: 30 ug/min via INTRAVENOUS

## 2018-11-29 MED ORDER — MAGNESIUM CITRATE PO SOLN
1.0000 | Freq: Once | ORAL | Status: DC | PRN
  Filled 2018-11-29: qty 296

## 2018-11-29 MED ORDER — GENTAMICIN SULFATE 40 MG/ML IJ SOLN
INTRAMUSCULAR | Status: AC
  Filled 2018-11-29: qty 8

## 2018-11-29 SURGICAL SUPPLY — 65 items
BANDAGE ACE 6X5 VEL STRL LF (GAUZE/BANDAGES/DRESSINGS) ×3 IMPLANT
BLADE SAW 1 (BLADE) ×3 IMPLANT
BLADE SAW 1/2 (BLADE) ×3 IMPLANT
CANISTER SUCT 1200ML W/VALVE (MISCELLANEOUS) ×3 IMPLANT
CANISTER SUCT 3000ML PPV (MISCELLANEOUS) ×6 IMPLANT
CEMENT HV SMART SET (Cement) ×6 IMPLANT
CHLORAPREP W/TINT 26ML (MISCELLANEOUS) ×6 IMPLANT
COOLER POLAR GLACIER W/PUMP (MISCELLANEOUS) ×3 IMPLANT
COVER BACK TABLE 60X90IN (DRAPES) ×3 IMPLANT
COVER WAND RF STERILE (DRAPES) ×3 IMPLANT
CUFF TOURN 24 STER (MISCELLANEOUS) IMPLANT
CUFF TOURN 30 STER DUAL PORT (MISCELLANEOUS) IMPLANT
DRAPE SHEET LG 3/4 BI-LAMINATE (DRAPES) ×12 IMPLANT
ELECT CAUTERY BLADE 6.4 (BLADE) ×3 IMPLANT
ELECT REM PT RETURN 9FT ADLT (ELECTROSURGICAL) ×3
ELECTRODE REM PT RTRN 9FT ADLT (ELECTROSURGICAL) ×1 IMPLANT
FEMORAL COMP CEMENTED SZ3 L (Femur) ×3 IMPLANT
GAUZE PETRO XEROFOAM 1X8 (MISCELLANEOUS) ×3 IMPLANT
GAUZE SPONGE 4X4 12PLY STRL (GAUZE/BANDAGES/DRESSINGS) ×3 IMPLANT
GLOVE BIOGEL PI IND STRL 9 (GLOVE) ×1 IMPLANT
GLOVE BIOGEL PI INDICATOR 9 (GLOVE) ×2
GLOVE INDICATOR 8.0 STRL GRN (GLOVE) ×3 IMPLANT
GLOVE SURG ORTHO 8.0 STRL STRW (GLOVE) ×3 IMPLANT
GLOVE SURG SYN 9.0  PF PI (GLOVE) ×2
GLOVE SURG SYN 9.0 PF PI (GLOVE) ×1 IMPLANT
GOWN SRG 2XL LVL 4 RGLN SLV (GOWNS) ×1 IMPLANT
GOWN STRL NON-REIN 2XL LVL4 (GOWNS) ×2
GOWN STRL REUS W/ TWL LRG LVL3 (GOWN DISPOSABLE) ×1 IMPLANT
GOWN STRL REUS W/ TWL XL LVL3 (GOWN DISPOSABLE) ×1 IMPLANT
GOWN STRL REUS W/TWL LRG LVL3 (GOWN DISPOSABLE) ×2
GOWN STRL REUS W/TWL XL LVL3 (GOWN DISPOSABLE) ×2
HOLDER FOLEY CATH W/STRAP (MISCELLANEOUS) ×3 IMPLANT
HOOD PEEL AWAY FLYTE STAYCOOL (MISCELLANEOUS) ×6 IMPLANT
INSERT TIBIAL FIXED SZ2 LT (Insert) ×3 IMPLANT
KIT TURNOVER KIT A (KITS) ×3 IMPLANT
KNIFE SCULPS 14X20 (INSTRUMENTS) ×3 IMPLANT
NEEDLE SPNL 18GX3.5 QUINCKE PK (NEEDLE) ×3 IMPLANT
NEEDLE SPNL 20GX3.5 QUINCKE YW (NEEDLE) ×3 IMPLANT
NS IRRIG 1000ML POUR BTL (IV SOLUTION) ×3 IMPLANT
PACK TOTAL KNEE (MISCELLANEOUS) ×3 IMPLANT
PAD WRAPON POLAR KNEE (MISCELLANEOUS) ×1 IMPLANT
PULSAVAC PLUS IRRIG FAN TIP (DISPOSABLE) ×3
SCALPEL PROTECTED #10 DISP (BLADE) ×6 IMPLANT
SET EXT COMP 11X65 (Miscellaneous) ×3 IMPLANT
SOL .9 NS 3000ML IRR  AL (IV SOLUTION) ×2
SOL .9 NS 3000ML IRR UROMATIC (IV SOLUTION) ×1 IMPLANT
STAPLER SKIN PROX 35W (STAPLE) ×3 IMPLANT
SUCTION FRAZIER HANDLE 10FR (MISCELLANEOUS) ×2
SUCTION TUBE FRAZIER 10FR DISP (MISCELLANEOUS) ×1 IMPLANT
SUT DVC 2 QUILL PDO  T11 36X36 (SUTURE) ×2
SUT DVC 2 QUILL PDO T11 36X36 (SUTURE) ×1 IMPLANT
SUT ETHIBOND 2-0  6-8 CP-2 (SUTURE) ×2
SUT ETHIBOND 2-0 6-8 CP-2 (SUTURE) ×1 IMPLANT
SUT TICRON 2-0 30IN 311381 (SUTURE) IMPLANT
SUT V-LOC 90 ABS DVC 3-0 CL (SUTURE) ×3 IMPLANT
SWAB CULTURE AMIES ANAERIB BLU (MISCELLANEOUS) IMPLANT
SYR 20CC LL (SYRINGE) ×3 IMPLANT
SYR 50ML LL SCALE MARK (SYRINGE) ×6 IMPLANT
TIBIAL REV AUGMENTATION SZ2 5 (Orthopedic Implant) ×6 IMPLANT
TIP FAN IRRIG PULSAVAC PLUS (DISPOSABLE) ×1 IMPLANT
TOWEL OR 17X26 4PK STRL BLUE (TOWEL DISPOSABLE) ×3 IMPLANT
TOWER CARTRIDGE SMART MIX (DISPOSABLE) ×3 IMPLANT
TRAY FOLEY MTR SLVR 16FR STAT (SET/KITS/TRAYS/PACK) ×3 IMPLANT
TRAY TIBIAL REV L S2 (Knees) ×3 IMPLANT
WRAPON POLAR PAD KNEE (MISCELLANEOUS) ×3

## 2018-11-29 NOTE — H&P (Signed)
Reviewed paper H+P, will be scanned into chart. No changes noted.  

## 2018-11-29 NOTE — Transfer of Care (Signed)
Immediate Anesthesia Transfer of Care Note  Patient: Susan Houston  Procedure(s) Performed: Left TOTAL KNEE REVISION (Left Knee)  Patient Location: PACU  Anesthesia Type:Spinal  Level of Consciousness: awake and alert   Airway & Oxygen Therapy: Patient Spontanous Breathing  Post-op Assessment: Report given to RN and Post -op Vital signs reviewed and stable  Post vital signs: Reviewed  Last Vitals:  Vitals Value Taken Time  BP 96/53 11/29/2018  9:37 AM  Temp    Pulse 63 11/29/2018  9:37 AM  Resp 11 11/29/2018  9:37 AM  SpO2 98 % 11/29/2018  9:37 AM  Vitals shown include unvalidated device data.  Last Pain:  Vitals:   11/29/18 0606  TempSrc: Temporal  PainSc: 7          Complications: No apparent anesthesia complications

## 2018-11-29 NOTE — NC FL2 (Signed)
[place] LEVEL OF CARE SCREENING TOOL     IDENTIFICATION  Patient Name: Susan Houston Birthdate: 05/02/1961 Sex: female Admission Date (Current Location): 11/29/2018  [place] and [place] Number:  Engineering geologist and Address:  [facility], [address] [place], [place] [address]      Provider Number: [id number]  Attending Physician Name and Address:  Hessie Knows, MD  Relative Name and Phone Number:       Current Level of Care: Hospital Recommended Level of Care: [place] Prior Approval Number:    Date Approved/Denied:   PASRR Number: ([id number])  Discharge Plan: SNF    Current Diagnoses: Patient Active Problem List   Diagnosis Date Noted  . S/P revision of total knee, left 11/29/2018  . Costochondritis 10/27/2018  . Primary insomnia 09/16/2018  . Chronic chest wall pain following surgery (Right) 09/08/2018  . Intercostal pain (Right) 09/08/2018  . Intercostal neuralgia 09/08/2018  . 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 Chronic post-thoracotomy pain 08/29/2018  . Neurogenic pain 08/29/2018  . Vitamin D insufficiency 08/29/2018  . Rib pain (Primary Area of Pain) (Right) 08/18/2018  . Incisional pain (Secondary Area of Pain) 08/18/2018  . Chronic pain syndrome 08/18/2018  . Long term current use of opiate analgesic 08/18/2018  . Pharmacologic therapy 08/18/2018  . Disorder of skeletal system 08/18/2018  . Problems influencing health status 08/18/2018  . Controlled substance agreement signed 06/28/2018  . Primary cancer of right lower lobe of lung ([place]) 05/27/2018  . Lung cancer ([place]) 05/16/2018  . Laryngeal cancer ([place]) 04/22/2018  . Mass of lower lobe of right lung 04/22/2018  . Vitamin B12 deficiency 12/15/2017  . Frequent falls 11/23/2017  . Mild cognitive impairment 11/23/2017  . Migraine without aura and without status migrainosus, not intractable 09/11/2016  . Hyperlipidemia   . COPD  (chronic obstructive pulmonary disease) ([place])   . Severe depression ([place]) 12/24/2015  . Chronic anxiety 12/24/2015  . Coarse tremors 12/24/2015  . Lumbar facet syndrome 03/28/2015  . DDD (degenerative disc disease), lumbar 03/05/2015  . DDD (degenerative disc disease), lumbosacral 02/23/2015  . Sacroiliac joint dysfunction 02/23/2015  . Saphenous neuralgia 09/24/2014    Orientation RESPIRATION BLADDER Height & Weight     Self, Time, Situation, Place  Normal Continent Weight: 195 lb 8.8 oz (88.7 kg) Height:  5\' 2"  (157.5 cm)  BEHAVIORAL SYMPTOMS/MOOD NEUROLOGICAL BOWEL NUTRITION STATUS      Continent Diet(Diet: Regular )  AMBULATORY STATUS COMMUNICATION OF NEEDS Skin   Extensive Assist Verbally Surgical wounds, Wound Vac(Incision: Left Knee, Provena Wound Vac. )                       Personal Care Assistance Level of Assistance  Bathing, Feeding, Dressing Bathing Assistance: Limited assistance Feeding assistance: Independent Dressing Assistance: Limited assistance     Functional Limitations Info  Sight, Hearing, Speech Sight Info: Adequate Hearing Info: Adequate Speech Info: Adequate    SPECIAL CARE FACTORS FREQUENCY  PT (By licensed PT), OT (By licensed OT)     PT Frequency: (5) OT Frequency: (5)            Contractures      Additional Factors Info  Code Status, Allergies Code Status Info: (Full Code. ) Allergies Info: (Morphine And Related, Cymbalta Duloxetine Hcl, Adhesive Tape, Lexapro Escitalopram Oxalate)           Current Medications (11/29/2018):  This is the current hospital active medication list  Current Facility-Administered Medications  Medication Dose Route Frequency Provider Last Rate Last Dose  . 0.9 %  sodium chloride infusion   Intravenous Continuous Hessie Knows, MD 75 mL/hr at 11/29/18 1134    . acetaminophen (TYLENOL) tablet 1,000 mg  1,000 mg Oral Q6H Hessie Knows, MD   1,000 mg at 11/29/18 1218  . [START ON 11/30/2018]  acetaminophen (TYLENOL) tablet 325-650 mg  325-650 mg Oral Q6H PRN Hessie Knows, MD      . albuterol (PROVENTIL) (2.5 MG/3ML) 0.083% nebulizer solution 2.5 mg  2.5 mg Inhalation Q4H PRN Hessie Knows, MD      . alum & mag hydroxide-simeth (MAALOX/MYLANTA) 200-200-20 MG/5ML suspension 30 mL  30 mL Oral Q4H PRN Hessie Knows, MD      . aspirin EC tablet 81 mg  81 mg Oral Daily Hessie Knows, MD   81 mg at 11/29/18 1219  . bisacodyl (DULCOLAX) EC tablet 5 mg  5 mg Oral Daily PRN Hessie Knows, MD      . ceFAZolin (ANCEF) 2-4 GM/100ML-% IVPB           . ceFAZolin (ANCEF) IVPB 2g/100 mL premix  2 g Intravenous Q6H Hessie Knows, MD   Stopped at 11/29/18 1430  . clonazePAM (KLONOPIN) tablet 1 mg  1 mg Oral QHS Hessie Knows, MD      . docusate sodium (COLACE) capsule 100 mg  100 mg Oral BID Hessie Knows, MD      . Derrill Memo ON 11/30/2018] enoxaparin (LOVENOX) injection 30 mg  30 mg Subcutaneous Q12H Hessie Knows, MD      . Derrill Memo ON 11/30/2018] FLUoxetine (PROZAC) capsule 20 mg  20 mg Oral Q breakfast Hessie Knows, MD      . gabapentin (NEURONTIN) capsule 300-900 mg  300-900 mg Oral BID Hessie Knows, MD      . HYDROcodone-acetaminophen [facility]) 7.5-325 MG per tablet 1-2 tablet  1-2 tablet Oral Q4H PRN Hessie Knows, MD   1 tablet at 11/29/18 1530  . HYDROmorphone (DILAUDID) injection 0.5-1 mg  0.5-1 mg Intravenous Q4H PRN Hessie Knows, MD   0.5 mg at 11/29/18 1406  . hydrOXYzine (ATARAX/VISTARIL) tablet 50 mg  50 mg Oral TID PRN Hessie Knows, MD      . magnesium citrate solution 1 Bottle  1 Bottle Oral Once PRN Hessie Knows, MD      . magnesium hydroxide (MILK OF MAGNESIA) suspension 30 mL  30 mL Oral Daily PRN Hessie Knows, MD      . menthol-cetylpyridinium (CEPACOL) lozenge 3 mg  1 lozenge Oral PRN Hessie Knows, MD       Or  . phenol (CHLORASEPTIC) mouth spray 1 spray  1 spray Mouth/Throat PRN Hessie Knows, MD      . methocarbamol (ROBAXIN) tablet 500 mg  500 mg Oral Q6H PRN Hessie Knows, MD    500 mg at 11/29/18 1208   Or  . methocarbamol (ROBAXIN) 500 mg in dextrose 5 % 50 mL IVPB  500 mg Intravenous Q6H PRN Hessie Knows, MD      . metoCLOPramide (REGLAN) tablet 5-10 mg  5-10 mg Oral Q8H PRN Hessie Knows, MD       Or  . metoCLOPramide (REGLAN) injection 5-10 mg  5-10 mg Intravenous Q8H PRN Hessie Knows, MD      . ondansetron [facility]) tablet 4 mg  4 mg Oral Q6H PRN Hessie Knows, MD       Or  . ondansetron [facility]) injection 4 mg  4 mg Intravenous Q6H PRN  Hessie Knows, MD      . QUEtiapine (SEROQUEL) tablet 25 mg  25 mg Oral QHS Hessie Knows, MD      . Suvorexant TABS 20 mg  20 mg Oral QHS Hessie Knows, MD      . traMADol Veatrice [facility]bon) tablet 50 mg  50 mg Oral Q6H Hessie Knows, MD   50 mg at 11/29/18 1218  . zolpidem (AMBIEN) tablet 5 mg  5 mg Oral QHS PRN Hessie Knows, MD       Facility-Administered Medications Ordered in Other Encounters  Medication Dose Route Frequency Provider Last Rate Last Dose  . ipratropium-albuterol (DUONEB) 0.5-2.5 (3) MG/3ML nebulizer solution 3 mL  3 mL Nebulization Q6H Burns, Wandra Feinstein, NP         Discharge Medications: Please see discharge summary for a list of discharge medications.  Relevant Imaging Results:  Relevant Lab Results:   Additional Information (SSN: [id number])  Dilyn Osoria, Veronia Beets, LCSW

## 2018-11-29 NOTE — Progress Notes (Signed)
Patient complaining of back pain and requesting a heating pad. Dr. Rudene Christians notified and order received for [facility]

## 2018-11-29 NOTE — Anesthesia Post-op Follow-up Note (Signed)
Anesthesia QCDR form completed.        

## 2018-11-29 NOTE — Anesthesia Preprocedure Evaluation (Signed)
Anesthesia Evaluation  Patient identified by MRN, date of birth, ID band Patient awake    Reviewed: Allergy & Precautions, H&P , NPO status , Patient's Chart, lab work & pertinent test results, reviewed documented beta blocker date and time   Airway Mallampati: II   Neck ROM: full    Dental  (+) Poor Dentition   Pulmonary asthma , COPD, former smoker,  Sp pulmonary resection.  SOB with exertion.   Pulmonary exam normal        Cardiovascular Exercise Tolerance: Poor hypertension, Normal cardiovascular exam+ dysrhythmias  Rhythm:regular Rate:Normal     Neuro/Psych  Headaches, PSYCHIATRIC DISORDERS Anxiety Depression    GI/Hepatic negative GI ROS, Neg liver ROS,   Endo/Other  negative endocrine ROS  Renal/GU negative Renal ROS  negative genitourinary   Musculoskeletal   Abdominal   Peds  Hematology  (+) Blood dyscrasia, anemia ,   Anesthesia Other Findings Past Medical History: No date: Anemia     Comment:  vitamin b12 deficiency. no longer taking supplements No date: Anxiety No date: Arthritis No date: Asthma No date: Back pain No date: Bruises easily 2009: Cancer ([place])     Comment:  LARYNX CANCER - CHEMO / RADIATION (NO SURGERY)  No date: Chronic leg pain     Comment:  BILATERAL No date: COPD (chronic obstructive pulmonary disease) (HCC)     Comment:  no inhalers for over 1 year No date: Depression No date: Difficulty sleeping No date: Diverticulitis 04/2018: Dysrhythmia     Comment:  brady episodes. cleared by dr. Clayborn Bigness 03/28/2015: Facet syndrome, lumbar No date: Hemorrhoids 04/2018: History of kidney stones     Comment:  recently passed stone No date: Hyperlipidemia 2019: Hypertension     Comment:  not on any treatment currently No date: Incontinence of urine 2009: Laryngeal cancer ([place]) 04/2018: Lung cancer ([place])     Comment:  diagnosed via ct scan 04/18/2018: Lung cancer ([place]) No date:  Migraine 09/11/2016: Migraine without aura and without status migrainosus, not  intractable 11/23/2017: Mild cognitive impairment No date: MRSA infection greater than 3 months ago     Comment:  2008 right side of face No date: Nerve damage     Comment:  right leg. thinks this is from her TKR 04/22/2018: Nodule of lower lobe of right lung No date: Oxygen deficiency     Comment:  2L/HS No date: Wears dentures     Comment:  full upper and lower Past Surgical History: 1983: ABDOMINAL HYSTERECTOMY 2014: BLADDER SUSPENSION 05/11/2018: CATARACT EXTRACTION W/PHACO; Left     Comment:  Procedure: CATARACT EXTRACTION PHACO AND INTRAOCULAR               LENS PLACEMENT ([place]) LEFT;  Surgeon: Leandrew Koyanagi, MD;  Location: [facility];  Service:               Ophthalmology;  Laterality: Left;  PREFERS EARLY 08/11/2018: CATARACT EXTRACTION W/PHACO; Right     Comment:  Procedure: CATARACT EXTRACTION PHACO AND INTRAOCULAR               LENS PLACEMENT (IOC);  Surgeon: Leandrew Koyanagi, MD;              Location: [facility];  Service: Ophthalmology;  Laterality:              Right;  CDE 5:02 Total Time 01:35 Fluid lot # [id number] 1998: CHOLECYSTECTOMY No date:  COLONOSCOPY 2015: ESOPHAGOGASTRODUODENOSCOPY 11/20/2016: ESOPHAGOGASTRODUODENOSCOPY (EGD) WITH PROPOFOL; N/A     Comment:  Procedure: ESOPHAGOGASTRODUODENOSCOPY (EGD) WITH               PROPOFOL;  Surgeon: Jonathon Bellows, MD;  Location: [facility]               ENDOSCOPY;  Service: Endoscopy;  Laterality: N/A; 09/24/2014: EXCISION NEUROMA; Bilateral     Comment:  Procedure: BILATERAL OPEN SAPHENOUS NEURECTOMIES AND               OPEN LEFT PERIPATELLA OSETOPHYTECTOMY;  Surgeon: Mauri Pole, MD;  Location: [facility];  Service: Orthopedics;                Laterality: Bilateral; 05/11/2018: EYE SURGERY; Left     Comment:  cataract extraction 2009/2010: JOINT REPLACEMENT     Comment:  BIL TOTAL KNEES 05/16/2018:  THORACOTOMY/LOBECTOMY; Right     Comment:  Procedure: THORACOTOMY/POSSIBLE LOBECTOMY;  Surgeon:               Nestor Lewandowsky, MD;  Location: [facility];  Service:               Thoracic;  Laterality: Right; No date: TONSILLECTOMY 05/16/2018: VIDEO BRONCHOSCOPY; N/A     Comment:  Procedure: PREOP  BRONCHOSCOPY;  Surgeon: Nestor Lewandowsky,              MD;  Location: [facility];  Service: Thoracic;  Laterality:              N/A;   Reproductive/Obstetrics negative OB ROS                             Anesthesia Physical Anesthesia Plan  ASA: III  Anesthesia Plan: Spinal   Post-op Pain Management:    Induction:   PONV Risk Score and Plan: 3  Airway Management Planned:   Additional Equipment:   Intra-op Plan:   Post-operative Plan:   Informed Consent: I have reviewed the patients History and Physical, chart, labs and discussed the procedure including the risks, benefits and alternatives for the proposed anesthesia with the patient or authorized representative who has indicated his/her understanding and acceptance.     Dental Advisory Given  Plan Discussed with: CRNA  Anesthesia Plan Comments:         Anesthesia Quick Evaluation

## 2018-11-29 NOTE — Care Management (Signed)
Went to see patient to complete assessment and provide the [facility] choice list per CMS.  PT was in with the patient Left list at bedside will assess once PT completed their evaluation

## 2018-11-29 NOTE — OR Nursing (Signed)
Dr Rudene Christians reviewed urine culture, no new orders.

## 2018-11-29 NOTE — Evaluation (Signed)
Physical Therapy Evaluation Patient Details Name: CARIS CERVENY MRN: [id number] DOB: 12/16/1960 Today's Date: 11/29/2018   History of Present Illness  Pt is a 58 y.o female s/p L TKR. Pt with PMH of mild cognitive impairment, lung CA, HTN, dysrrythmia, COPD, back pain, and anemia.     Clinical Impression  Ms. Abend admitted for the above and presents with the deficits listed below (see PT problem list). Pt states that she lives in a 1 story home that requires her to negotiation 5 steps with bilateral hand rail in the back entrance and 2 steps in the front entrance. Pt lives with husband who is able to provide intermittent assist as he works. PTA pt was community ambulator with RW reporting no falls in the past 6 months. Pt required Mod A x2 for LLE and trunk support from sup>sitting. No physical assist provided to maintain balance in sitting. Min A x2 for sit>stand. Pt instructed to sit following noted unsteadiness and pt report of her "knees shaking". Min guard for stand>sit. Mod A x2 for BLE and trunk support from sitting to supine. VC provided for hand placement and RW management. Pursed lip breathing was instructed throughout entire session for pain management. Pt would benefit from HHPT to continue to work towards mobility goals.    Follow Up Recommendations Supervision for mobility/OOB;Home health PT    Equipment Recommendations  Other (comment)(youth walker )    Recommendations for Other Services       Precautions / Restrictions Precautions Precautions: Fall;Knee Precaution Booklet Issued: Yes (comment) Precaution Comments: Education provided to not place anything under knee.  Restrictions Weight Bearing Restrictions: Yes LLE Weight Bearing: Weight bearing as tolerated      Mobility  Bed Mobility Overal bed mobility: Needs Assistance Bed Mobility: Supine to Sit;Sit to Supine     Supine to sit: Mod assist;+2 for physical assistance Sit to supine: Mod assist;+2 for physical  assistance   General bed mobility comments: Pt on RA throughout session. Pt was able to move BUE towards edge of bed with VC but required Mod A of LLE from supine to EOB and Min A for trunk support. In sitting pt was able to maintain sitting balance without assist. Mod A for BLE and Min A for trunk support for sit to supine. Pt denied SOB or dizziness. Pt yelling out due to pain with all mobilty.   Transfers Overall transfer level: Needs assistance Equipment used: (youth walker ) Transfers: Sit to/from Stand Sit to Stand: Min assist;+2 physical assistance         General transfer comment: Min A x1 was provided for STS.  In standing Min guard was providing for safety. Pt reports her knees were shaky following lateral weight shifting at which time she was assisted to sitting at EOB, unsteadiness noted. Min guard was provided for stand>sit. Pt denied SOB or dizziness. Pt yelling out due to pain.   Ambulation/Gait Ambulation/Gait assistance: Min guard Gait Distance (Feet): 1 Feet Assistive device: (youth walker) Gait Pattern/deviations: Step-to pattern;Decreased step length - right;Decreased step length - left;Decreased stance time - left;Decreased dorsiflexion - left;Decreased weight shift to left;Trunk flexed     General Gait Details: Min guard provided for step forward and backward. Pt able to take small step forward with BLE when unsteadiness was noted. Pt then instructed to take a step backwards and assisted into sitting at EOB. SPT provided VC for sequence of movement and RW management.   Stairs  Wheelchair Mobility    Modified Rankin (Stroke Patients Only)       Balance Overall balance assessment: Needs assistance Sitting-balance support: Bilateral upper extremity supported;Feet supported Sitting balance-Leahy Scale: Poor Sitting balance - Comments: Pt required BUE support to maintain sitting balance, if challenged pt would likely lose balance. SPT min guard for  safety.   Standing balance support: Bilateral upper extremity supported;During functional activity Standing balance-Leahy Scale: Poor Standing balance comment: Pt required BUE support of RW to maintain balance with static and dynamic activity. SPT min guard for safety.                             Pertinent Vitals/Pain Pain Assessment: 0-10 Pain Score: 8  Pain Location: L knee  Pain Descriptors / Indicators: Grimacing;Guarding;Moaning;Shooting Pain Intervention(s): Monitored during session;RN gave pain meds during session;Patient requesting pain meds-RN notified;Relaxation;Ice applied    Home Living Family/patient expects to be discharged to:: Private residence Living Arrangements: Spouse/significant other Available Help at Discharge: Family;Available PRN/intermittently(husband works but is able to come home for lunch.) Type of Home: House Home Access: Stairs to enter Entrance Stairs-Rails: Can reach both Entrance Stairs-Number of Steps: 5(5 steps for back, 2 for front. 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 Reports no falls in the past 6 months. Pt states she was still driving PTA.   ADL's / Homemaking Assistance Needed: Pt reports she is independent with bathing and dressing but husband provides assistance for cooking and cleaning.        Hand Dominance   Dominant Hand: Right    Extremity/Trunk Assessment   Upper Extremity Assessment Upper Extremity Assessment: Overall WFL for tasks assessed    Lower Extremity Assessment Lower Extremity Assessment: RLE deficits/detail;LLE deficits/detail RLE Deficits / Details: Pt was able to perform SLR, heel slide, and ankle pumps without physical assist.  LLE Deficits / Details:  Pt was able to perform heel slide and ankle pumps without physical assist. Min A was provided for SLR. Unable to formally test s/p TKR.        Communication   Communication: No difficulties  Cognition Arousal/Alertness: Awake/alert Behavior During Therapy: WFL for tasks assessed/performed Overall Cognitive Status: Within Functional Limits for tasks assessed                                        General Comments General comments (skin integrity, edema, etc.): BP in supine 105/60, in sitting 98/75    Exercises Total Joint Exercises Ankle Circles/Pumps: AROM;Both;10 reps;Supine Other Exercises Other Exercises: weight shifting in standing with BUE on RW x5 each way.    Assessment/Plan    PT Assessment Patient needs continued PT services  PT Problem List Decreased strength;Decreased range of motion;Decreased activity tolerance;Decreased balance;Decreased mobility;Decreased knowledge of use of DME;Decreased safety awareness;Decreased knowledge of precautions;Pain       PT Treatment Interventions DME instruction;Gait training;Stair training;Functional mobility training;Therapeutic activities;Therapeutic exercise;Balance training;Neuromuscular re-education;Patient/family education;Wheelchair mobility training;Modalities    PT Goals (Current goals can be found in the Care Plan section)  Acute Rehab PT Goals Patient Stated Goal: to decrease pain PT Goal Formulation: With patient Time For  Goal Achievement: 12/13/18 Potential to Achieve Goals: Good    Frequency BID   Barriers to discharge        Co-evaluation               AM-PAC PT "6 Clicks" Mobility  Outcome Measure Help needed turning from your back to your side while in a flat bed without using bedrails?: A Little Help needed moving from lying on your back to sitting on the side of a flat bed without using bedrails?: A Little Help needed moving to and from a bed to a chair (including a wheelchair)?: A  Little Help needed standing up from a chair using your arms (e.g., wheelchair or bedside chair)?: A Little Help needed to walk in hospital room?: A Lot Help needed climbing 3-5 steps with a railing? : Total 6 Click Score: 15    End of Session Equipment Utilized During Treatment: Gait belt Activity Tolerance: Patient limited by pain Patient left: in bed;with call bell/phone within reach;with bed alarm set;with SCD's reapplied;Other (comment)(polar care, bone foam ) Nurse Communication: Mobility status;Other (comment)(unsteadiness ) PT Visit Diagnosis: Unsteadiness on feet (R26.81);Other abnormalities of gait and mobility (R26.89);Muscle weakness (generalized) (M62.81);Pain Pain - Right/Left: Left Pain - part of body: Knee    Time: 1517-1600 PT Time Calculation (min) (ACUTE ONLY): 43 min   Charges:              Dorothy Spark, SPT  11/29/2018, 4:46 PM

## 2018-11-29 NOTE — Op Note (Signed)
11/29/2018  9:41 AM  PATIENT:  Susan Houston  58 y.o. female  PRE-OPERATIVE DIAGNOSIS:  Left Knee Instability left total knee  POST-OPERATIVE DIAGNOSIS:  Left Knee Instability left total knee  PROCEDURE:  Procedure(s): Left TOTAL KNEE REVISION (Left)  SURGEON: Laurene Footman, MD  ASSISTANTS: Rachelle Hora, PA-C  ANESTHESIA:   spinal  EBL:  Total I/O In: 1200 [I.V.:1200] Out: 200 [Urine:150; Blood:50]  BLOOD ADMINISTERED:none  DRAINS: none   LOCAL MEDICATIONS USED:  OTHER Exparel   SPECIMEN:  Source of Specimen:  Culture synovial fluid  DISPOSITION OF SPECIMEN:  Microbiology  COUNTS:  YES  TOURNIQUET: Tourniquet time 46 minutes at 300 mmHg  IMPLANTS: Medacta GM K sphere femur size 3 left, 2 fixed tibial baseplate with 65 mm stem and medial and lateral 5 mm augments 13 mm insert  DICTATION: .Dragon Dictation patient was brought to surgery and after spinal anesthesia was obtained the left leg was prepped and draped in the usual sterile fashion.  After patient identification and timeout procedures were completed a midline skin incision was made utilizing the prior incision followed by medial parapatellar arthrotomy and culture obtained.  Synovial fluid was clear and no evidence of infection.  Initially scar tissue was removed around the implants anteriorly around the tibia as well as around the periphery of the femoral component thin flexible and rigid osteotomes were used to remove both implants with minimal bone loss the proximal tibias cut with a freehand cut and sized to a size 2 tibial preparation was carried out with drill hole proximally followed by hand reaming and keel punch with a 2 trial.  Next the femur sized to a 3 anterior to posterior and chamfer cuts were freshened up cuts were made followed by the trochlear groove cut and trials were placed the augments were placed on the tibia because of the gap after bone removal.  The knee was thoroughly irrigated and tourniquet  raised at this point with the tibial cemented into place first with the 65 mm stem the femur was cemented into place with trial insert in between.  After the cemented set excess cement was removed and different trials were placed with a 13 mm giving the best stability to the knee.  The 13 final 13 mm insert was placed with the screw set screw tightened with a torque screwdriver.  Patella had been previously examined and was not loose or worn it was so was left.  A lateral release was performed and the patella tracked well after this. The knee was irrigated with 3 L of saline and then 3 L a bit dilute Betadine solution prior to closure the arthrotomy was repaired. Ethibond followed by heavy Brion Aliment was used for the capsule, 3 OV lock subcutaneously followed by skin staples and incisional wound VAC  PLAN OF CARE: Admit to inpatient   PATIENT DISPOSITION:  PACU - hemodynamically stable.

## 2018-11-29 NOTE — Progress Notes (Addendum)
Called [facility] in [place] to get titration instructions for the gabapentin 300mg .They do not have the written titration instructions. Will need to ask patient. Left message for RN to ask patient how she takes medication.

## 2018-11-29 NOTE — Anesthesia Procedure Notes (Signed)
Spinal  Patient location during procedure: OR Staffing Anesthesiologist: Adams, James G, MD Resident/CRNA: Phaedra Colgate, CRNA Performed: resident/CRNA  Preanesthetic Checklist Completed: patient identified, site marked, surgical consent, pre-op evaluation, timeout performed, IV checked, risks and benefits discussed and monitors and equipment checked Spinal Block Patient position: sitting Prep: ChloraPrep and site prepped and draped Patient monitoring: heart rate, continuous pulse ox, blood pressure and cardiac monitor Approach: midline Location: L4-5 Injection technique: single-shot Needle Needle type: Introducer and Pencan  Needle gauge: 24 G Needle length: 9 cm Additional Notes Negative paresthesia. Negative blood return. Positive free-flowing CSF. Expiration date of kit checked and confirmed. Patient tolerated procedure well, without complications.       

## 2018-11-29 NOTE — Progress Notes (Signed)
Patient still c/o pain. Stating that Oxy doesn't work for her. BP 95/51. Notified MD. New orders to change oxy to Norco 7.5/325 1-2 tabs q4h, prn and give one dose of Toradol 15mg  x1 now.

## 2018-11-30 ENCOUNTER — Ambulatory Visit: Payer: Medicare HMO | Admitting: Pain Medicine

## 2018-11-30 LAB — CBC
HCT: 30 % — ABNORMAL LOW (ref 36.0–46.0)
Hemoglobin: 9 g/dL — ABNORMAL LOW (ref 12.0–15.0)
MCH: 26.9 pg (ref 26.0–34.0)
MCHC: 30 g/dL (ref 30.0–36.0)
MCV: 89.8 fL (ref 80.0–100.0)
Platelets: 165 10*3/uL (ref 150–400)
RBC: 3.34 MIL/uL — ABNORMAL LOW (ref 3.87–5.11)
RDW: 14.1 % (ref 11.5–15.5)
WBC: 5.3 10*3/uL (ref 4.0–10.5)
nRBC: 0 % (ref 0.0–0.2)

## 2018-11-30 LAB — BASIC METABOLIC PANEL
Anion gap: 1 — ABNORMAL LOW (ref 5–15)
BUN: 12 mg/dL (ref 6–20)
CO2: 30 mmol/L (ref 22–32)
Calcium: 8 mg/dL — ABNORMAL LOW (ref 8.9–10.3)
Chloride: 108 mmol/L (ref 98–111)
Creatinine, Ser: 0.67 mg/dL (ref 0.44–1.00)
GFR calc Af Amer: >60 mL/min (ref >60)
GFR calc non Af Amer: >60 mL/min (ref >60)
Glucose, Bld: 112 mg/dL — ABNORMAL HIGH (ref 70–99)
Potassium: 4.2 mmol/L (ref 3.5–5.1)
Sodium: 139 mmol/L (ref 135–145)

## 2018-11-30 MED ORDER — SODIUM CHLORIDE 0.9 % IV BOLUS
500.0000 mL | Freq: Once | INTRAVENOUS | Status: AC
  Administered 2018-11-30: 500 mL via INTRAVENOUS

## 2018-11-30 MED ORDER — FE FUMARATE-B12-VIT C-FA-IFC PO CAPS
1.0000 | ORAL_CAPSULE | Freq: Three times a day (TID) | ORAL | Status: DC
  Administered 2018-11-30 – 2018-12-02 (×8): 1 via ORAL
  Filled 2018-11-30 (×9): qty 1

## 2018-11-30 MED ORDER — ORAL CARE MOUTH RINSE
15.0000 mL | Freq: Two times a day (BID) | OROMUCOSAL | Status: DC
  Administered 2018-11-30 – 2018-12-01 (×3): 15 mL via OROMUCOSAL

## 2018-11-30 NOTE — Care Management Note (Deleted)
Case Management Note  Patient Details  Name: Susan Houston MRN: [id number] Date of Birth: 04/26/1961  Subjective/Objective:                  Met with the patient to discuss DC plan, HH list provided per CMS.gov Patient is declining HH and wants to go to rehab, [facility] Patient has a RW at home Patient lives with spouse that works and has no help at home during the day Has transportation [facility] Continue to monitor for any needs,    Action/Plan: wants to go to rehab, HH list provided for choice per CMS.gov, declines HH, Has a RW at home   Expected Discharge Date:  12/01/18               Expected Discharge Plan:     In-House Referral:     Discharge planning Services  CM Consult  Post Acute Care Choice:    Choice offered to:     DME Arranged:    DME Agency:     HH Arranged:    HH Agency:     Status of Service:  In process, will continue to follow  If discussed at Long Length of Stay Meetings, dates discussed:    Additional Comments:   J , RN 11/30/2018, 1:54 PM  

## 2018-11-30 NOTE — Anesthesia Postprocedure Evaluation (Signed)
Anesthesia Post Note  Patient: Susan Houston  Procedure(s) Performed: Left TOTAL KNEE REVISION (Left Knee)  Patient location during evaluation: Nursing Unit Anesthesia Type: Spinal Level of consciousness: oriented and awake and alert Pain management: pain level controlled Vital Signs Assessment: post-procedure vital signs reviewed and stable Respiratory status: spontaneous breathing and respiratory function stable Cardiovascular status: blood pressure returned to baseline and stable Postop Assessment: no headache, no backache, no apparent nausea or vomiting and patient able to bend at knees Anesthetic complications: no     Last Vitals:  Vitals:   11/29/18 2339 11/30/18 0359  BP: 127/65 132/62  Pulse: 69 60  Resp: 19 18  Temp: 36.6 C 36.6 C  SpO2: 97% 98%    Last Pain:  Vitals:   11/30/18 0359  TempSrc: Oral  PainSc:                  Alison Stalling

## 2018-11-30 NOTE — Progress Notes (Signed)
Physical Therapy Treatment Patient Details Name: Susan Houston MRN: [id number] DOB: 12/11/1960 Today's Date: 11/30/2018    History of Present Illness Pt is a 58 y.o female s/p L total knee revision. Pt with PMH of mild cognitive impairment, lung CA, HTN, dysrrythmia, COPD, back pain, and anemia.     PT Comments    Susan Houston progressed towards her mobility goals this session as she was able to ambulate 45 ft. Orthostatics monitored this session due to + orthostatics in earlier session today.Pt did not require any physical assist or VC for bed mobility. In supine at rest BP 136/65 and sitting at EOB 118/89. SPT min guard for STS, pt required min VC for handplacement with stand>sit. Initial BP in standing 113/69. Pt denied any dizziness. BP in standing after 3 min 125/84. SPT min guard during ambulation, VC provided to increase L/R knee flexion during swing phase and cease RLE circumduction during R swing phase. VC also provided for pt to bear less weight throught BUE on RW and more through LE. Pt denied any dizziness throughout session. BP 141/61 in recliner following ambulation. Pt denied any dizziness throughout session. RN notified.    Follow Up Recommendations  Supervision for mobility/OOB;SNF     Equipment Recommendations  Other (comment)(youth walker)    Recommendations for Other Services       Precautions / Restrictions Precautions Precautions: Fall;Knee Restrictions Weight Bearing Restrictions: Yes LLE Weight Bearing: Weight bearing as tolerated    Mobility  Bed Mobility Overal bed mobility: Needs Assistance Bed Mobility: Supine to Sit     Supine to sit: Min guard     General bed mobility comments: Pt did not require any physical assist or VC for bed monility. Pt able to scoot towards EOB without assist. SPT min guard for safety.  Transfers Overall transfer level: Needs assistance Equipment used: (youth walker) Transfers: Sit to/from Stand Sit to Stand: Min guard          General transfer comment: SPT min guard for STS, pt required min VC for handplacement with stand>sit.   Ambulation/Gait Ambulation/Gait assistance: Min guard Gait Distance (Feet): 45 Feet Assistive device: (youth walker) Gait Pattern/deviations: Step-through pattern;Decreased step length - right;Decreased step length - left;Decreased stance time - left;Decreased weight shift to left(decL/R knee flexion with swing phase. RLE circumduction.) Gait velocity: decreased    General Gait Details: Pt able to ambulate 45 ft this session without requiring rest breaks. Pt demonstrated decreased L/R knee flexion during swing phase of gait which was corrected following VC. RLE circumduction with swing phase of gait on R, pt able to correct following VC. VC also provided for pt to bear less weight throught BUE on RW and more through LE. Pt denied any dizziness.    Stairs             Wheelchair Mobility    Modified Rankin (Stroke Patients Only)       Balance Overall balance assessment: Needs assistance Sitting-balance support: No upper extremity supported;Feet supported Sitting balance-Leahy Scale: Fair Sitting balance - Comments: Pt did not utilize UE for support, if challenged suspected to lose balance.    Standing balance support: Bilateral upper extremity supported;During functional activity Standing balance-Leahy Scale: Poor Standing balance comment: Pt required BUE support of RW to maintain balance with static and dynamic activity. SPT min guard for safety.  Cognition Arousal/Alertness: Awake/alert Behavior During Therapy: WFL for tasks assessed/performed Overall Cognitive Status: Within Functional Limits for tasks assessed                                        Exercises Total Joint Exercises Ankle Circles/Pumps: AROM;Both;10 reps;Supine Heel Slides: Strengthening;Left;5 reps;Supine Marching in Standing:  Strengthening;Both;10 reps;Standing Other Exercises Other Exercises: weight shifting in standing with BUE support of RW x10 each way Other Exercises: Pt educated on importance of chairs in every room and to take seated rest breaks if fatigued or dizzy.     General Comments General comments (skin integrity, edema, etc.): Orthostatics monitored this session due to + orthostatics in earlier session. In supine at rest BP 136/65, sitting at EOB 118/89, initial in standing 113/69. Pt denied any dizziness. BP in standing after 3 min 125/84 and 141/61 in recliner following ambulation. Pt denied any dizziness throughout session. RN notified.       Pertinent Vitals/Pain Pain Assessment: 0-10 Pain Score: 7  Pain Descriptors / Indicators: Grimacing;Guarding;Moaning Pain Intervention(s): Limited activity within patient's tolerance;Premedicated before session;Repositioned;Ice applied    Home Living                      Prior Function            PT Goals (current goals can now be found in the care plan section) Acute Rehab PT Goals Patient Stated Goal: to decrease pain PT Goal Formulation: With patient Time For Goal Achievement: 12/13/18 Potential to Achieve Goals: Good Progress towards PT goals: Progressing toward goals    Frequency    BID      PT Plan Current plan remains appropriate    Co-evaluation              AM-PAC PT "6 Clicks" Mobility   Outcome Measure  Help needed turning from your back to your side while in a flat bed without using bedrails?: A Little Help needed moving from lying on your back to sitting on the side of a flat bed without using bedrails?: A Little Help needed moving to and from a bed to a chair (including a wheelchair)?: A Little Help needed standing up from a chair using your arms (e.g., wheelchair or bedside chair)?: A Little Help needed to walk in hospital room?: A Little Help needed climbing 3-5 steps with a railing? : A Lot 6 Click  Score: 17    End of Session Equipment Utilized During Treatment: Gait belt Activity Tolerance: Patient tolerated treatment well Patient left: in chair;with call bell/phone within reach;with chair alarm set;Other (comment)(polar care, towell roll, with SW) Nurse Communication: Mobility status;Other (comment)(BP) PT Visit Diagnosis: Unsteadiness on feet (R26.81);Other abnormalities of gait and mobility (R26.89);Muscle weakness (generalized) (M62.81);Pain Pain - Right/Left: Left Pain - part of body: Knee     Time: 8341-9622 PT Time Calculation (min) (ACUTE ONLY): 30 min  Charges:  $Gait Training: 8-22 mins $Therapeutic Exercise: 8-22 mins                     Dorothy Spark, SPT  11/30/2018, 4:53 PM

## 2018-11-30 NOTE — Care Management Note (Signed)
Case Management Note  Patient Details  Name: Susan Houston MRN: [id number] Date of Birth: 1961-08-06  Subjective/Objective:                  Met with patient to discuss plan and needs She has chosen [facility] if going to Rehab, has been provided with [facility] list per CMS.gov and wants to use [facility] if she goes home with [facility] Patient has DME including RW and BSC Patient has help at home Patient sees DR. Wynetta Emery as PCP [facility] [place] Can afford medications Notified Susan Houston at [facility] of choice  Action/Plan: [place] list provided and chose [facility], notified Susan Houston of choice  Expected Discharge Date:  12/01/18               Expected Discharge Plan:     In-House Referral:     Discharge planning Services  CM Consult  Post Acute Care Choice:    Choice offered to:     DME Arranged:    DME Agency:     HH Arranged:  PT HH Agency:  [facility]  Status of Service:  In process, will continue to follow  If discussed at Long Length of Stay Meetings, dates discussed:    Additional Comments:  Susan Hilt, RN 11/30/2018, 3:46 PM

## 2018-11-30 NOTE — Clinical Social Work Note (Signed)
Clinical Social Work Assessment  Patient Details  Name: Susan Houston MRN: [id number] Date of Birth: [birth year]  Date of referral:  11/30/18               Reason for consult:  Facility Placement                Permission sought to share information with:  Chartered certified accountant granted to share information::  Yes, Verbal Permission Granted  Name::      [place]::   [place]   Relationship::     Contact Information:     Housing/Transportation Living arrangements for the past 2 months:  [place] of Information:  Patient Patient Interpreter Needed:  None Criminal Activity/Legal Involvement Pertinent to Current Situation/Hospitalization:  No - Comment as needed Significant Relationships:  Spouse Lives with:  Spouse Do you feel safe going back to the place where you live?  Yes Need for family participation in patient care:  Yes (Comment)  Care giving concerns:  Patient lives in [place] with her husband Steele Sizer.    Social Worker assessment / plan:  Holiday representative (CSW) received SNF consult. PT is recommending SNF. CSW met with patient alone at bedside to discuss D/C plan. Patient was alert and oriented X4 and was sitting up in the chair at bedside. CSW introduced self and explained role of CSW department. Per patient she lives in [place] with her husband Steele Sizer. CSW explained that PT is recommending SNF. Patient is hoping to improve enough with PT to go home with home health but is agreeable to SNF search in [place]. CSW explained that [facility] will have to approve SNF. Patient verbalized her understanding. FL2 complete and faxed out.   CSW presented bed offers to patient and discussed the quality measures of the facilities. [facility] chose [facility]. Per Otila K[facility] liaison she will start [facility] SNF authorization today. Patient reported that if she goes home with home health she prefers [facility]. RN case manager  aware of above. CSW will continue to follow and assist as needed.   Employment status:  Disabled (Comment on whether or not currently receiving Disability), Retired Nurse, adult PT Recommendations:  [place] / Referral to community resources:  [place]  Patient/Family's Response to care:  Patient chose [facility].   Patient/Family's Understanding of and Emotional Response to Diagnosis, Current Treatment, and Prognosis:  Patient was very pleasant and thanked CSW for assistance.   Emotional Assessment Appearance:  Appears stated age Attitude/Demeanor/Rapport:    Affect (typically observed):  Accepting, Adaptable, Pleasant Orientation:  Oriented to Self, Oriented to Place, Oriented to  Time, Oriented to Situation Alcohol / Substance use:  Not Applicable Psych involvement (Current and /or in the community):  No (Comment)  Discharge Needs  Concerns to be addressed:  Discharge Planning Concerns Readmission within the last 30 days:  No Current discharge risk:  Dependent with Mobility Barriers to Discharge:  Continued Medical Work up   [employer], Veronia Beets, LCSW 11/30/2018, 3:28 PM

## 2018-11-30 NOTE — Progress Notes (Signed)
Subjective: 1 Day Post-Op Procedure(s) (LRB): Left TOTAL KNEE REVISION (Left) Patient reports pain as 7 on 0-10 scale.   Patient is well, and has had no acute complaints or problems Denies any CP, SOB, ABD pain. We will continue therapy today.   Objective: Vital signs in last 24 hours: Temp:  [96.7 F (35.9 C)-97.9 F (36.6 C)] 97.8 F (36.6 C) (02/12 0359) Pulse Rate:  [60-79] 60 (02/12 0359) Resp:  [11-19] 18 (02/12 0359) BP: (93-132)/(48-79) 132/62 (02/12 0359) SpO2:  [96 %-100 %] 98 % (02/12 0359) Weight:  [88.7 kg] 88.7 kg (02/11 1110)  Intake/Output from previous day: 02/11 0701 - 02/12 0700 In: 3069.4 [I.V.:2269.4; IV Piggyback:800] Out: 6962 [Urine:1740; Blood:50] Intake/Output this shift: No intake/output data recorded.  Recent Labs    11/29/18 1138 11/30/18 0426  HGB 10.6* 9.0*   Recent Labs    11/29/18 1138 11/30/18 0426  WBC 8.1 5.3  RBC 3.94 3.34*  HCT 34.8* 30.0*  PLT 184 165   Recent Labs    11/29/18 1138 11/30/18 0426  NA  --  139  K  --  4.2  CL  --  108  CO2  --  30  BUN  --  12  CREATININE 0.60 0.67  GLUCOSE  --  112*  CALCIUM  --  8.0*   No results for input(s): LABPT, INR in the last 72 hours.  EXAM General - Patient is Alert, Appropriate and Oriented Extremity - Neurovascular intact Sensation intact distally Intact pulses distally Dorsiflexion/Plantar flexion intact No cellulitis present Compartment soft Dressing - dressing C/D/I and no drainage, Praveena dressing intact.  No drainage. Motor Function - intact, moving foot and toes well on exam.   Past Medical History:  Diagnosis Date  . Anemia    vitamin b12 deficiency. no longer taking supplements  . Anxiety   . Arthritis   . Asthma   . Back pain   . Bruises easily   . 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 Chronic leg pain    BILATERAL  . COPD (chronic obstructive pulmonary disease) (H[facility])    no inhalers for over 1 year  .  Depression   . Difficulty sleeping   . Diverticulitis   . Dysrhythmia 04/2018   brady episodes. cleared by dr. Clayborn Bigness  . Facet syndrome, lumbar 03/28/2015  . Hemorrhoids   . History of kidney stones 04/2018   recently passed stone  . Hyperlipidemia   . Hypertension 2019   not on any treatment currently  . Incontinence of urine   . Laryngeal cancer ([place]) 2009  . Lung cancer ([place]) 04/2018   diagnosed via ct scan  . Lung cancer ([place]) 04/18/2018  . Migraine   . Migraine without aura and without status migrainosus, not intractable 09/11/2016  . Mild cognitive impairment 11/23/2017  . MRSA infection greater than 3 months ago    2008 right side of face  . Nerve damage    right leg. thinks this is from her TKR  . Nodule of lower lobe of right lung 04/22/2018  . Oxygen deficiency    2L/HS  . Wears dentures    full upper and lower    Assessment/Plan:   1 Day Post-Op Procedure(s) (LRB): Left TOTAL KNEE REVISION (Left) Active Problems:   S/P revision of total knee, left  Estimated body mass index is 35.77 kg/m as calculated from the following:   Height as of this encounter: 5'  2" (1.575 m).   Weight as of this encounter: 88.7 kg. Advance diet Up with therapy  Labs and vital signs are stable Moderate pain.  Continue with current pain regimen Recheck labs in the morning Needs bowel movement Care management to assist with discharge  DVT Prophylaxis - Lovenox, TED hose and SCDs Weight-Bearing as tolerated to left leg   T. Rachelle Hora, PA-C [place] 11/30/2018, 7:46 AM

## 2018-11-30 NOTE — Progress Notes (Signed)
Patient got up with PT, starting BP 141/63 and standing for three minutes went to 77/65. MD notified. New orders for bolus placed. Bolus started. BP at rest (laying)  115/64, HR 66 for this RN before bolus started.

## 2018-11-30 NOTE — Progress Notes (Signed)
Physical Therapy Treatment Patient Details Name: Susan Houston MRN: [id number] DOB: 1961-03-04 Today's Date: 11/30/2018    History of Present Illness Pt is a 58 y.o female s/p L total knee revision. Pt with PMH of mild cognitive impairment, lung CA, HTN, dysrrythmia, COPD, back pain, and anemia.     PT Comments    Susan Houston demonstrated better pain management but was limited due to + orthostatics this session. PT attempted to see pt earlier this morning but was held due to 88/62 and 94/75 BP in supine. BP in supine this session prior to activity was 141/63, sitting at EOB 1236/63, standing with RW 104/69. SPT instructed pt to maintain static standing, BP 3 minutes later 77/65, pt immeadiately assisted to supine. Initially in supine BP 101/69, 5 minutes later 118/87. Pt denied dizziness throughout session. Per pt baseline BP 120/84. RN notified. Current plan remains appropriate at discharge.    Follow Up Recommendations  Supervision for mobility/OOB;SNF     Equipment Recommendations  Other (comment)(youth walker)    Recommendations for Other Services       Precautions / Restrictions Precautions Precautions: Fall;Knee Precaution Comments: Education provided to not place anything under knee.  Restrictions Weight Bearing Restrictions: Yes LLE Weight Bearing: Weight bearing as tolerated    Mobility  Bed Mobility Overal bed mobility: Needs Assistance Bed Mobility: Supine to Sit;Sit to Supine     Supine to sit: Min guard Sit to supine: Min guard   General bed mobility comments: Pt on RA throughout session. Pt did not require physical assist for sup<>sit. VC were provided for pt to scoot towards [facility] prior to sit>sup. In supine prior to activity BP 141/63, immeadiately following stand >sup 101/69, 5 minutes after in supine 118/87. Pt denied and dizziness.   Transfers Overall transfer level: Needs assistance Equipment used: (youth walker ) Transfers: Sit to/from Stand Sit to Stand:  Min guard         General transfer comment: SPT min guard for safety, no physical assist required. VC provided for hand placement on RW with STS. BP sitting at EOB 126/63. Pt denied any dizziness.   Ambulation/Gait             General Gait Details: Unable to attempt this session due to + orthostatics.    Stairs             Wheelchair Mobility    Modified Rankin (Stroke Patients Only)       Balance Overall balance assessment: Needs assistance Sitting-balance support: Single extremity supported;Feet supported Sitting balance-Leahy Scale: Fair Sitting balance - Comments: Pt able to maintain sitting balance with intermittent use of 1 UE. Suspected to lose balance if challenged.    Standing balance support: Bilateral upper extremity supported;During functional activity Standing balance-Leahy Scale: Poor Standing balance comment: Pt required BUE support of RW to maintain balance with static and dynamic activity. SPT min guard for safety.                            Cognition Arousal/Alertness: Awake/alert Behavior During Therapy: WFL for tasks assessed/performed Overall Cognitive Status: Within Functional Limits for tasks assessed                                        Exercises Total Joint Exercises Ankle Circles/Pumps: AROM;Both;10 reps;Supine Quad Sets: Strengthening;AROM;Left;10 reps;Supine Heel Slides: Strengthening;Left;5 reps;Supine  Straight Leg Raises: AAROM;Strengthening;Left;5 reps;Supine(2 with assist 3 without assist) Knee Flexion: AROM;Left;5 reps;Seated Goniometric ROM: -6/88    General Comments General comments (skin integrity, edema, etc.): Pt on RA throughout session. Pt demonstrated better pain management this session as she did not yell out due to pain. In supine prior to activity BP 141/63, sitting at EOB 1236/63, standing with RW 104/69. SPT instructed pt to maintain static standing, BP 3 minutes later 77/65, pt  immeadiately assisted to supine. Initially in supine BP 101/69, 5 minutes later 118/87. Pt denied dizziness throughout session. Per pt baseline BP 120/84. RN notified.      Pertinent Vitals/Pain Pain Assessment: 0-10 Pain Score: 7  Pain Location: L knee  Pain Descriptors / Indicators: Grimacing;Guarding;Moaning Pain Intervention(s): Monitored during session;Patient requesting pain meds-RN notified;Ice applied    Home Living                      Prior Function            PT Goals (current goals can now be found in the care plan section) Acute Rehab PT Goals Patient Stated Goal: to decrease pain PT Goal Formulation: With patient Time For Goal Achievement: 12/13/18 Potential to Achieve Goals: Good Progress towards PT goals: Progressing toward goals    Frequency    BID      PT Plan Current plan remains appropriate    Co-evaluation              AM-PAC PT "6 Clicks" Mobility   Outcome Measure  Help needed turning from your back to your side while in a flat bed without using bedrails?: A Little Help needed moving from lying on your back to sitting on the side of a flat bed without using bedrails?: A Little Help needed moving to and from a bed to a chair (including a wheelchair)?: A Little Help needed standing up from a chair using your arms (e.g., wheelchair or bedside chair)?: A Little Help needed to walk in hospital room?: A Little Help needed climbing 3-5 steps with a railing? : A Lot 6 Click Score: 17    End of Session Equipment Utilized During Treatment: Gait belt Activity Tolerance: Treatment limited secondary to medical complications (Comment)(+ orthostatic ) Patient left: in bed;with call bell/phone within reach;with bed alarm set;Other (comment);with SCD's reapplied(polar care, bone foam) Nurse Communication: Mobility status;Other (comment)(+ orthostatics, bed pan in place ) PT Visit Diagnosis: Unsteadiness on feet (R26.81);Other abnormalities of  gait and mobility (R26.89);Muscle weakness (generalized) (M62.81);Pain Pain - Right/Left: Left Pain - part of body: Knee     Time: 1572-6203 PT Time Calculation (min) (ACUTE ONLY): 32 min  Charges:  $Therapeutic Activity: 23-37 mins                    Susan Houston, SPT  11/30/2018, 1:17 PM

## 2018-11-30 NOTE — Progress Notes (Signed)
PT Cancellation Note  Patient Details Name: Susan Houston MRN: [id number] DOB: May 28, 1961   Cancelled Treatment:    Reason Eval/Treat Not Completed: Medical issues which prohibited therapy. BP on RUE in supine 88/62. BP on LUE in supine 98/75. RN notified. PT to attempt to see pt later this morning with stable BP permitting.    Dorothy Spark, SPT  11/30/2018, 9:23 AM

## 2018-11-30 NOTE — Clinical Social Work Placement (Signed)
   CLINICAL SOCIAL WORK PLACEMENT  NOTE  Date:  11/30/2018  Patient Details  Name: AZARAH DACY MRN: [id number] Date of Birth: 01-18-1961  Clinical Social Work is seeking post-discharge placement for this patient at the [place] level of care (*CSW will initial, date and re-position this form in  chart as items are completed):  Yes   Patient/family provided with [facility] Work Department's list of facilities offering this level of care within the geographic area requested by the patient (or if unable, by the patient's family).  Yes   Patient/family informed of their freedom to choose among providers that offer the needed level of care, that participate in Medicare, Medicaid or managed care program needed by the patient, have an available bed and are willing to accept the patient.  Yes   Patient/family informed of [facility]'s ownership interest in [facility] and [facility], as well as of the fact that they are under no obligation to receive care at these facilities.  PASRR submitted to EDS on 11/29/18     PASRR number received on 11/29/18     Existing PASRR number confirmed on       FL2 transmitted to all facilities in geographic area requested by pt/family on 11/30/18     FL2 transmitted to all facilities within larger geographic area on       Patient informed that his/her managed care company has contracts with or will negotiate with certain facilities, including the following:        Yes   Patient/family informed of bed offers received.  Patient chooses bed at ([facility] )     Physician recommends and patient chooses bed at      Patient to be transferred to   on  .  Patient to be transferred to facility by       Patient family notified on   of transfer.  Name of family member notified:        PHYSICIAN       Additional Comment:    _______________________________________________ Agnes Brightbill, Veronia Beets, LCSW 11/30/2018, 3:27 PM

## 2018-12-01 LAB — BASIC METABOLIC PANEL
Anion gap: 3 — ABNORMAL LOW (ref 5–15)
BUN: 11 mg/dL (ref 6–20)
CALCIUM: 8.1 mg/dL — AB (ref 8.9–10.3)
CO2: 30 mmol/L (ref 22–32)
Chloride: 106 mmol/L (ref 98–111)
Creatinine, Ser: 0.56 mg/dL (ref 0.44–1.00)
GFR calc Af Amer: >60 mL/min (ref >60)
GFR calc non Af Amer: >60 mL/min (ref >60)
Glucose, Bld: 112 mg/dL — ABNORMAL HIGH (ref 70–99)
Potassium: 3.9 mmol/L (ref 3.5–5.1)
Sodium: 139 mmol/L (ref 135–145)

## 2018-12-01 LAB — CBC
HCT: 27.6 % — ABNORMAL LOW (ref 36.0–46.0)
Hemoglobin: 8.4 g/dL — ABNORMAL LOW (ref 12.0–15.0)
MCH: 26.9 pg (ref 26.0–34.0)
MCHC: 30.4 g/dL (ref 30.0–36.0)
MCV: 88.5 fL (ref 80.0–100.0)
Platelets: 159 10*3/uL (ref 150–400)
RBC: 3.12 MIL/uL — ABNORMAL LOW (ref 3.87–5.11)
RDW: 14.3 % (ref 11.5–15.5)
WBC: 5.3 10*3/uL (ref 4.0–10.5)
nRBC: 0 % (ref 0.0–0.2)

## 2018-12-01 MED ORDER — HYDROCODONE-ACETAMINOPHEN 7.5-325 MG PO TABS: 1.0000 | ORAL_TABLET | ORAL | 0 refills | Status: DC | PRN

## 2018-12-01 MED ORDER — ENOXAPARIN SODIUM 40 MG/0.4ML ~~LOC~~ SOLN: 40.0000 mg | SUBCUTANEOUS | 0 refills | Status: DC

## 2018-12-01 MED ORDER — TRAMADOL HCL 50 MG PO TABS: 50.0000 mg | ORAL_TABLET | Freq: Four times a day (QID) | ORAL | 0 refills | Status: DC

## 2018-12-01 NOTE — Progress Notes (Signed)
Physical Therapy Treatment Patient Details Name: Susan Houston MRN: [id number] DOB: October 18, 1961 Today's Date: 12/01/2018    History of Present Illness Pt is a 58 y.o female s/p L total knee revision. Pt with PMH of mild cognitive impairment, lung CA, HTN, dysrrythmia, COPD, back pain, and anemia.     PT Comments    Pt limited this session due to pain and +orthostatics. Min guard for sup>sit. BP in supine prior to activity 133/89, initially in sitting 115/73, sitting after 1 min 122/62. Min guard for STS. In standing BP 94/70, pt eye closing and waivering. SPT instructed pt to sit down. BP in sitting following STS 117/67, in supine following STS 112/65. Pt denied dizziness throughout entire session. Of note pt waivering and eye closing intermittently, pt able to open eyes stabilize balance with VC but unable to maintain eyes open and balance. RN notified of +orthostatics. Current plan of SNF at discharge remains appropriate.       Follow Up Recommendations  Supervision for mobility/OOB;SNF     Equipment Recommendations  Other (comment)(youth walker)    Recommendations for Other Services       Precautions / Restrictions Precautions Precautions: Fall;Knee Restrictions Weight Bearing Restrictions: No LLE Weight Bearing: Weight bearing as tolerated    Mobility  Bed Mobility Overal bed mobility: Needs Assistance Bed Mobility: Supine to Sit;Sit to Supine     Supine to sit: Min guard Sit to supine: Min assist   General bed mobility comments: Pt required Min guard for sup>sit. Min A provided to assist LLE from sitting>sup. BP in supine prior to activity 133/89, initially in sitting 115/73, sitting after 1 min 122/62, sitting following STS 117/67, in supine following STS 112/65. In sitting pt eye closing and waivering despite VC to open eyes and perform pursed lip breathing. Pt denied dizziness.   Transfers Overall transfer level: Needs assistance Equipment used: (youth  walker) Transfers: Sit to/from Stand Sit to Stand: Min guard         General transfer comment: Min guard for STS. In standing BP 94/70, pt eye closing and waivering. SPT instructed pt to sit down. Pt denied any dizziness.   Ambulation/Gait             General Gait Details: Unable to attempt this session due to +orthostatics   Stairs             Wheelchair Mobility    Modified Rankin (Stroke Patients Only)       Balance Overall balance assessment: Needs assistance Sitting-balance support: Bilateral upper extremity supported;Feet supported Sitting balance-Leahy Scale: Poor Sitting balance - Comments: Pt required BUE support this session. [place] noted.    Standing balance support: Bilateral upper extremity supported;During functional activity Standing balance-Leahy Scale: Poor Standing balance comment: Pt required BUE support of RW to maintain balance with static and dynamic activity. SPT min guard for safety. [place] noted.                            Cognition Arousal/Alertness: Awake/alert Behavior During Therapy: WFL for tasks assessed/performed;Restless;Anxious Overall Cognitive Status: Within Functional Limits for tasks assessed                                        Exercises Total Joint Exercises Ankle Circles/Pumps: AROM;Both;15 reps;Supine Quad Sets: Strengthening;Both;10 reps;Supine Heel Slides: Strengthening;Left;Supine;Other reps (comment)(2) Knee Flexion: AROM;Left;Other  reps (comment);Seated(4) Goniometric ROM: -6-84 Other Exercises Other Exercises: weight shifting in standing with BUE support of RW x3 each way.     General Comments General comments (skin integrity, edema, etc.): Pt on RA throughout session. In supine prior to activity BP 133/89, initally in sitting at EOB 115/73, sitting at EOB after 1 min 122/62, with STS 94/70. Pt instructed to sit. In sitting BP 117/67, in supine 112/65. Of note pt waivering  and eye closing intermittently, pt able to open eyes stabilize balance with VC but unable to maintain eyes open and balance. Pt denied dizziness throughout session. RN notified of + orthostatics.      Pertinent Vitals/Pain Pain Assessment: Faces Faces Pain Scale: Hurts worst Pain Location: L knee  Pain Descriptors / Indicators: Grimacing;Guarding;Moaning Pain Intervention(s): Monitored during session;Premedicated before session;Patient requesting pain meds-RN notified;Relaxation;Ice applied    Home Living                      Prior Function            PT Goals (current goals can now be found in the care plan section) Acute Rehab PT Goals Patient Stated Goal: to decrease pain PT Goal Formulation: With patient Time For Goal Achievement: 12/13/18 Potential to Achieve Goals: Good Progress towards PT goals: Not progressing toward goals - comment(+orthostatics )    Frequency    BID      PT Plan Current plan remains appropriate    Co-evaluation              AM-PAC PT "6 Clicks" Mobility   Outcome Measure  Help needed turning from your back to your side while in a flat bed without using bedrails?: A Little Help needed moving from lying on your back to sitting on the side of a flat bed without using bedrails?: A Little Help needed moving to and from a bed to a chair (including a wheelchair)?: A Little Help needed standing up from a chair using your arms (e.g., wheelchair or bedside chair)?: A Little Help needed to walk in hospital room?: A Lot Help needed climbing 3-5 steps with a railing? : A Lot 6 Click Score: 16    End of Session Equipment Utilized During Treatment: Gait belt Activity Tolerance: Patient limited by pain;Treatment limited secondary to medical complications (Comment)(+ orthostatics) Patient left: with call bell/phone within reach;Other (towel roll, polar care);in bed;with bed alarm set Nurse Communication: Mobility status;Other  (comment)(+orthostatics, waivering, wants to see mom) PT Visit Diagnosis: Unsteadiness on feet (R26.81);Other abnormalities of gait and mobility (R26.89);Muscle weakness (generalized) (M62.81);Pain Pain - Right/Left: Left Pain - part of body: Knee     Time: 5176-1607 PT Time Calculation (min) (ACUTE ONLY): 32 min  Charges:                       Clemetine Marker  12/01/2018, 10:02 AM

## 2018-12-01 NOTE — Progress Notes (Signed)
Physical Therapy Treatment Patient Details Name: Susan Houston MRN: [id number] DOB: 11/19/1960 Today's Date: 12/01/2018    History of Present Illness Pt is a 58 y.o female s/p L total knee revision. Pt with PMH of mild cognitive impairment, lung CA, HTN, dysrrythmia, COPD, back pain, and anemia.     PT Comments    Susan Houston progressed towards her mobility goals this session. Orthostatics monitored due to positive orthostatic values from previous session, BP however not of concern this session (see values listed in general comments). Pt ambulate 60 ft without requiring rest breaks, SPT min guard. Pt denied dizziness throughout session. Min guard provided for all aspects of mobility. Current plan remains appropriate due to lack of assistance at home, decreased activity tolerance, balance impairments, and strength deficits.     Follow Up Recommendations  Supervision for mobility/OOB;SNF     Equipment Recommendations  Other (comment)(youth walker)    Recommendations for Other Services       Precautions / Restrictions Precautions Precautions: Fall;Knee Restrictions Weight Bearing Restrictions: Yes LLE Weight Bearing: Weight bearing as tolerated    Mobility  Bed Mobility Overal bed mobility: Needs Assistance Bed Mobility: Supine to Sit     Supine to sit: Min guard     General bed mobility comments: Min guard for safety with bed mobility and sitting at EOB. Pt denied dizziness.  Transfers Overall transfer level: Needs assistance Equipment used: (youth walker ) Transfers: Sit to/from Stand Sit to Stand: Min guard         General transfer comment: Min guard for safety. Pt denied any dizziness.   Ambulation/Gait Ambulation/Gait assistance: Min guard Gait Distance (Feet): 60 Feet Assistive device: (youth walker ) Gait Pattern/deviations: Step-through pattern;Decreased step length - right;Decreased step length - left;Decreased stance time - left;Decreased weight shift to  left;Decreased dorsiflexion - right Gait velocity: decreased    General Gait Details: Pt able to ambulate 60 ft without requiring any rest breaks, SPT min guard for safety. Pt denied dizziness. +2 for chair follow due to previous orthostatic BP.    Stairs             Wheelchair Mobility    Modified Rankin (Stroke Patients Only)       Balance Overall balance assessment: Needs assistance Sitting-balance support: Single extremity supported;Feet supported Sitting balance-Leahy Scale: Fair Sitting balance - Comments: Pt used single UE support in sitting intermittently. If challenged pt would lose balance.    Standing balance support: Bilateral upper extremity supported;During functional activity Standing balance-Leahy Scale: Poor Standing balance comment: Pt required BUE support of RW to maintain balance with static and dynamic activity. SPT min guard for safety. [place] noted.                            Cognition Arousal/Alertness: Awake/alert Behavior During Therapy: WFL for tasks assessed/performed Overall Cognitive Status: Within Functional Limits for tasks assessed                                        Exercises Total Joint Exercises Ankle Circles/Pumps: AROM;Both;20 reps;Supine Quad Sets: Strengthening;Both;10 reps;Supine Heel Slides: Strengthening;AROM;Left;10 reps;Supine Other Exercises Other Exercises: weight shifting in standing with BUE support of RW x15 each way.     General Comments General comments (skin integrity, edema, etc.): Pt on RA throughout session. In supine BP 114/69, sitting at EOB 121/68,  initially in standing 111/80, standing after 1 min 107/72, standing after 3 min 108/78, in recliner following ambulation 117/51. Pt denied dizziness throughout session.      Pertinent Vitals/Pain Pain Assessment: Faces Faces Pain Scale: Hurts little more Pain Location: L knee  Pain Descriptors / Indicators:  Grimacing;Guarding;Moaning Pain Intervention(s): Limited activity within patient's tolerance;Monitored during session;Repositioned;Ice applied    Home Living                      Prior Function            PT Goals (current goals can now be found in the care plan section) Acute Rehab PT Goals Patient Stated Goal: to decrease pain PT Goal Formulation: With patient Time For Goal Achievement: 12/13/18 Potential to Achieve Goals: Good Progress towards PT goals: Progressing toward goals    Frequency    BID      PT Plan Current plan remains appropriate    Co-evaluation              AM-PAC PT "6 Clicks" Mobility   Outcome Measure  Help needed turning from your back to your side while in a flat bed without using bedrails?: A Little Help needed moving from lying on your back to sitting on the side of a flat bed without using bedrails?: A Little Help needed moving to and from a bed to a chair (including a wheelchair)?: A Little Help needed standing up from a chair using your arms (e.g., wheelchair or bedside chair)?: A Little Help needed to walk in hospital room?: A Little Help needed climbing 3-5 steps with a railing? : A Lot 6 Click Score: 17    End of Session Equipment Utilized During Treatment: Gait belt Activity Tolerance: Patient tolerated treatment well Patient left: in chair;with call bell/phone within reach;with chair alarm set;with family/visitor present;with SCD's reapplied;Other (comment)(bone foam, polar care ) Nurse Communication: Mobility status;Other (comment)(BP readings) PT Visit Diagnosis: Unsteadiness on feet (R26.81);Other abnormalities of gait and mobility (R26.89);Muscle weakness (generalized) (M62.81);Pain Pain - Right/Left: Left Pain - part of body: Knee     Time: 2831-5176 PT Time Calculation (min) (ACUTE ONLY): 30 min  Charges:  $Gait Training: 8-22 mins $Therapeutic Exercise: 8-22 mins              Dorothy Spark, SPT   12/01/2018, 3:54 PM

## 2018-12-01 NOTE — Progress Notes (Signed)
Per Otila Kluver [facility] liaison [facility] SNF authorization has been received. Plan is for patient to D/C to [facility] tomorrow pending medical clearance. Clinical Social Worker (CSW) met with patient and made her aware of above.   [employer], LCSW [phone]

## 2018-12-01 NOTE — Progress Notes (Signed)
Subjective: 2 Days Post-Op Procedure(s) (LRB): Left TOTAL KNEE REVISION (Left) Patient reports pain as moderate.   Patient is well, and has had no acute complaints or problems Denies any CP, SOB, ABD pain. We will continue therapy today.  Patient is passing gas this morning.  Objective: Vital signs in last 24 hours: Temp:  [97.7 F (36.5 C)-98.8 F (37.1 C)] 98.2 F (36.8 C) (02/12 2313) Pulse Rate:  [63-84] 84 (02/12 2313) Resp:  [17-18] 18 (02/12 2313) BP: (115-147)/(56-86) 117/60 (02/12 2313) SpO2:  [95 %-100 %] 95 % (02/12 2313)  Intake/Output from previous day: 02/12 0701 - 02/13 0700 In: 1708.5 [P.O.:240; I.V.:968.5; IV Piggyback:500] Out: 3850 [Urine:3850] Intake/Output this shift: No intake/output data recorded.  Recent Labs    11/29/18 1138 11/30/18 0426 12/01/18 0457  HGB 10.6* 9.0* 8.4*   Recent Labs    11/30/18 0426 12/01/18 0457  WBC 5.3 5.3  RBC 3.34* 3.12*  HCT 30.0* 27.6*  PLT 165 159   Recent Labs    11/30/18 0426 12/01/18 0457  NA 139 139  K 4.2 3.9  CL 108 106  CO2 30 30  BUN 12 11  CREATININE 0.67 0.56  GLUCOSE 112* 112*  CALCIUM 8.0* 8.1*   No results for input(s): LABPT, INR in the last 72 hours.  EXAM General - Patient is Alert, Appropriate and Oriented Extremity - Neurovascular intact Sensation intact distally Intact pulses distally Dorsiflexion/Plantar flexion intact No cellulitis present Compartment soft Dressing - dressing C/D/I and no drainage, Praveena dressing intact.  Minimal bloody drainage. Motor Function - intact, moving foot and toes well on exam.   Past Medical History:  Diagnosis Date  . Anemia    vitamin b12 deficiency. no longer taking supplements  . Anxiety   . Arthritis   . Asthma   . Back pain   . Bruises easily   . 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 Chronic leg pain    BILATERAL  . COPD (chronic obstructive pulmonary disease) ([facility])    no inhalers for over 1  year  . Depression   . Difficulty sleeping   . Diverticulitis   . Dysrhythmia 04/2018   brady episodes. cleared by dr. Clayborn Bigness  . Facet syndrome, lumbar 03/28/2015  . Hemorrhoids   . History of kidney stones 04/2018   recently passed stone  . Hyperlipidemia   . Hypertension 2019   not on any treatment currently  . Incontinence of urine   . Laryngeal cancer ([place]) 2009  . Lung cancer ([place]) 04/2018   diagnosed via ct scan  . Lung cancer ([place]) 04/18/2018  . Migraine   . Migraine without aura and without status migrainosus, not intractable 09/11/2016  . Mild cognitive impairment 11/23/2017  . MRSA infection greater than 3 months ago    2008 right side of face  . Nerve damage    right leg. thinks this is from her TKR  . Nodule of lower lobe of right lung 04/22/2018  . Oxygen deficiency    2L/HS  . Wears dentures    full upper and lower    Assessment/Plan:   2 Days Post-Op Procedure(s) (LRB): Left TOTAL KNEE REVISION (Left) Active Problems:   S/P revision of total knee, left  Estimated body mass index is 35.77 kg/m as calculated from the following:   Height as of this encounter: 5\' 2"  (1.575 m).   Weight as of this encounter: 88.7 kg. Advance diet Up with  therapy   Labs reviewed this AM, Hg 8.4 this morning. BP 117/60, continue to watch for orthostatic. Up with therapy today. Pt is passing gas, continue to work on BM. Care management to assist with discharge planning.  DVT Prophylaxis - Lovenox, TED hose and SCDs Weight-Bearing as tolerated to left leg   J. Cameron Proud, PA-C [place] 12/01/2018, 7:42 AM

## 2018-12-02 DIAGNOSIS — G4700 Insomnia, unspecified: Secondary | ICD-10-CM | POA: Diagnosis not present

## 2018-12-02 DIAGNOSIS — Z4789 Encounter for other orthopedic aftercare: Secondary | ICD-10-CM | POA: Diagnosis not present

## 2018-12-02 DIAGNOSIS — M6281 Muscle weakness (generalized): Secondary | ICD-10-CM | POA: Diagnosis not present

## 2018-12-02 DIAGNOSIS — M2550 Pain in unspecified joint: Secondary | ICD-10-CM | POA: Diagnosis not present

## 2018-12-02 DIAGNOSIS — T84093D Other mechanical complication of internal left knee prosthesis, subsequent encounter: Secondary | ICD-10-CM | POA: Diagnosis not present

## 2018-12-02 DIAGNOSIS — G8929 Other chronic pain: Secondary | ICD-10-CM | POA: Diagnosis not present

## 2018-12-02 DIAGNOSIS — J441 Chronic obstructive pulmonary disease with (acute) exacerbation: Secondary | ICD-10-CM | POA: Diagnosis not present

## 2018-12-02 DIAGNOSIS — R2681 Unsteadiness on feet: Secondary | ICD-10-CM | POA: Diagnosis not present

## 2018-12-02 DIAGNOSIS — Z96652 Presence of left artificial knee joint: Secondary | ICD-10-CM | POA: Diagnosis not present

## 2018-12-02 DIAGNOSIS — I959 Hypotension, unspecified: Secondary | ICD-10-CM | POA: Diagnosis not present

## 2018-12-02 DIAGNOSIS — M25562 Pain in left knee: Secondary | ICD-10-CM | POA: Diagnosis not present

## 2018-12-02 DIAGNOSIS — Z7401 Bed confinement status: Secondary | ICD-10-CM | POA: Diagnosis not present

## 2018-12-02 DIAGNOSIS — R52 Pain, unspecified: Secondary | ICD-10-CM | POA: Diagnosis not present

## 2018-12-02 DIAGNOSIS — K5900 Constipation, unspecified: Secondary | ICD-10-CM | POA: Diagnosis not present

## 2018-12-02 LAB — CBC
HCT: 28.1 % — ABNORMAL LOW (ref 36.0–46.0)
Hemoglobin: 8.5 g/dL — ABNORMAL LOW (ref 12.0–15.0)
MCH: 27.5 pg (ref 26.0–34.0)
MCHC: 30.2 g/dL (ref 30.0–36.0)
MCV: 90.9 fL (ref 80.0–100.0)
Platelets: 169 10*3/uL (ref 150–400)
RBC: 3.09 MIL/uL — ABNORMAL LOW (ref 3.87–5.11)
RDW: 14.3 % (ref 11.5–15.5)
WBC: 5.4 10*3/uL (ref 4.0–10.5)
nRBC: 0 % (ref 0.0–0.2)

## 2018-12-02 LAB — BASIC METABOLIC PANEL
ANION GAP: 3 — AB (ref 5–15)
BUN: 11 mg/dL (ref 6–20)
CO2: 32 mmol/L (ref 22–32)
Calcium: 8.3 mg/dL — ABNORMAL LOW (ref 8.9–10.3)
Chloride: 104 mmol/L (ref 98–111)
Creatinine, Ser: 0.57 mg/dL (ref 0.44–1.00)
Glucose, Bld: 99 mg/dL (ref 70–99)
Potassium: 4.2 mmol/L (ref 3.5–5.1)
Sodium: 139 mmol/L (ref 135–145)

## 2018-12-02 MED ORDER — BISACODYL 10 MG RE SUPP
10.0000 mg | Freq: Once | RECTAL | Status: AC
  Administered 2018-12-02: 10 mg via RECTAL
  Filled 2018-12-02: qty 1

## 2018-12-02 MED ORDER — DOCUSATE SODIUM 100 MG PO CAPS: 100.0000 mg | ORAL_CAPSULE | Freq: Two times a day (BID) | ORAL | 0 refills | Status: DC

## 2018-12-02 NOTE — Progress Notes (Signed)
Physical Therapy Treatment Patient Details Name: Susan Houston MRN: [id number] DOB: [birth year] Today's Date: 12/02/2018    History of Present Illness Pt is a 58 y.o female s/p L total knee revision. Pt with PMH of mild cognitive impairment, lung CA, HTN, dysrrythmia, COPD, back pain, and anemia.     PT Comments    Pt progressed towards mobility goals as she was able to ambulate 100 ft with one rest break. Min guard provided for all aspects of mobility. One rest break while ambulating due to fatigue. VC provided for dec L knee flexion during swing phase of gait which improved following cuing. Pt denied dizziness throughout session. Current discharge plan remains appropriate.     Follow Up Recommendations  Supervision for mobility/OOB;SNF     Equipment Recommendations  Other (comment)(youth walker)    Recommendations for Other Services       Precautions / Restrictions Precautions Precautions: Fall;Knee Restrictions Weight Bearing Restrictions: Yes LLE Weight Bearing: Weight bearing as tolerated    Mobility  Bed Mobility Overal bed mobility: Needs Assistance Bed Mobility: Supine to Sit     Supine to sit: Min guard     General bed mobility comments: Min guard for sup>sit for safety.   Transfers Overall transfer level: Needs assistance Equipment used: (youth walker ) Transfers: Sit to/from Stand Sit to Stand: Min guard         General transfer comment: Min gaurd for safety.   Ambulation/Gait Ambulation/Gait assistance: Min guard Gait Distance (Feet): 100 Feet Assistive device: (youth walker) Gait Pattern/deviations: Step-to pattern;Decreased step length - right;Decreased step length - left;Decreased stance time - left;Decreased weight shift to left(dec L knee flex with swing phase of gait ) Gait velocity: decreased    General Gait Details: Pt ambulated 100 ft requiring one standing rest break due to fatigue. SPT min guard for safety. Dec L knee flex with swing  phase on L with gait, improved following VC.    Stairs             Wheelchair Mobility    Modified Rankin (Stroke Patients Only)       Balance Overall balance assessment: Needs assistance Sitting-balance support: No upper extremity supported;Feet supported Sitting balance-Leahy Scale: Fair Sitting balance - Comments: Pt without use of UE support. If challenged pt would lose balance.    Standing balance support: Bilateral upper extremity supported;During functional activity Standing balance-Leahy Scale: Poor Standing balance comment: Pt required BUE support of RW to maintain balance with static and dynamic activity. SPT min guard for safety. [place] noted.                            Cognition Arousal/Alertness: Awake/alert Behavior During Therapy: WFL for tasks assessed/performed;Flat affect Overall Cognitive Status: Within Functional Limits for tasks assessed                                        Exercises Total Joint Exercises Ankle Circles/Pumps: AROM;Both;20 reps;Supine Quad Sets: Strengthening;Both;10 reps;Supine Straight Leg Raises: AAROM;Strengthening;Left;5 reps;Supine Knee Flexion: AROM;Left;10 reps;Seated Goniometric ROM: -5-82    General Comments General comments (skin integrity, edema, etc.): In supine BP 126/63, in sitting at EOB 117/92, initially in standing 109/77, standing 3 min 108/68, in recliner following mobility 127/75. Pt denied dizziness throughout session.      Pertinent Vitals/Pain Pain Assessment: 0-10 Pain Score: 8  Pain Location: L knee  Pain Descriptors / Indicators: Grimacing;Guarding;Moaning Pain Intervention(s): Limited activity within patient's tolerance;Repositioned;Relaxation;Ice applied;Monitored during session;RN gave pain meds during session    Home Living                      Prior Function            PT Goals (current goals can now be found in the care plan section) Acute Rehab  PT Goals Patient Stated Goal: to decrease pain PT Goal Formulation: With patient Time For Goal Achievement: 12/13/18 Potential to Achieve Goals: Good Progress towards PT goals: Progressing toward goals    Frequency    BID      PT Plan Current plan remains appropriate    Co-evaluation              AM-PAC PT "6 Clicks" Mobility   Outcome Measure  Help needed turning from your back to your side while in a flat bed without using bedrails?: A Little Help needed moving from lying on your back to sitting on the side of a flat bed without using bedrails?: A Little Help needed moving to and from a bed to a chair (including a wheelchair)?: A Little Help needed standing up from a chair using your arms (e.g., wheelchair or bedside chair)?: A Little Help needed to walk in hospital room?: A Little Help needed climbing 3-5 steps with a railing? : A Lot 6 Click Score: 17    End of Session Equipment Utilized During Treatment: Gait belt Activity Tolerance: Patient tolerated treatment well Patient left: in chair;with call bell/phone within reach;with chair alarm set;with SCD's reapplied;Other (comment)(polar care, bone foam ) Nurse Communication: Mobility status;Other (comment)(BP) PT Visit Diagnosis: Unsteadiness on feet (R26.81);Other abnormalities of gait and mobility (R26.89);Muscle weakness (generalized) (M62.81);Pain Pain - Right/Left: Left Pain - part of body: Knee     Time: 1610-9604 PT Time Calculation (min) (ACUTE ONLY): 32 min  Charges:  $Gait Training: 23-37 mins                      Dorothy Spark, SPT  12/02/2018, 11:05 AM

## 2018-12-02 NOTE — Care Management Important Message (Signed)
Important Message  Patient Details  Name: Susan Houston MRN: [id number] Date of Birth: Aug 09, 1961   Medicare Important Message Given:  Yes    Juliann Pulse A Kallin Henk 12/02/2018, 10:21 AM

## 2018-12-02 NOTE — Progress Notes (Signed)
[facility] here to transport pt. 

## 2018-12-02 NOTE — Progress Notes (Signed)
Verbal orders MD MD Rudene Christians to d/c foley.

## 2018-12-02 NOTE — Clinical Social Work Placement (Addendum)
   CLINICAL SOCIAL WORK PLACEMENT  NOTE  Date:  12/02/2018  Patient Details  Name: Susan Houston MRN: [id number] Date of Birth: [birth year]  Clinical Social Work is seeking post-discharge placement for this patient at the [place] level of care (*CSW will initial, date and re-position this form in  chart as items are completed):  Yes   Patient/family provided with [place] Work Department's list of facilities offering this level of care within the geographic area requested by the patient (or if unable, by the patient's family).  Yes   Patient/family informed of their freedom to choose among providers that offer the needed level of care, that participate in Medicare, Medicaid or managed care program needed by the patient, have an available bed and are willing to accept the patient.  Yes   Patient/family informed of [facility]'s ownership interest in [facility] and [facility], as well as of the fact that they are under no obligation to receive care at these facilities.  PASRR submitted to EDS on 11/29/18     PASRR number received on 11/29/18     Existing PASRR number confirmed on       FL2 transmitted to all facilities in geographic area requested by pt/family on 11/30/18     FL2 transmitted to all facilities within larger geographic area on       Patient informed that his/her managed care company has contracts with or will negotiate with certain facilities, including the following:        Yes   Patient/family informed of bed offers received.  Patient chooses bed at ([facility] )     Physician recommends and patient chooses bed at      Patient to be transferred to ([facility] ) on 12/02/18.  Patient to be transferred to facility by [facility] )     Patient family notified on 12/02/18 of transfer.  Name of family member notified:  CSW contacted patient's husband Jaquelyn Bitter and made him aware of D/C today.   PHYSICIAN       Additional  Comment:    _______________________________________________ Lenton Gendreau, Veronia Beets, LCSW 12/02/2018, 2:20 PM

## 2018-12-02 NOTE — Discharge Summary (Addendum)
Physician Discharge Summary  Patient ID: Susan Houston MRN: [id number] DOB/AGE: 1960-12-10 58 y.o.  Admit date: 11/29/2018 Discharge date: 12/02/2018  Admission Diagnoses:  Left Knee Instability   Discharge Diagnoses: Patient Active Problem List   Diagnosis Date Noted  . S/P revision of total knee, left 11/29/2018  . Costochondritis 10/27/2018  . Primary insomnia 09/16/2018  . Chronic chest wall pain following surgery (Right) 09/08/2018  . Intercostal pain (Right) 09/08/2018  . Intercostal neuralgia 09/08/2018  . 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 Chronic post-thoracotomy pain 08/29/2018  . Neurogenic pain 08/29/2018  . Vitamin D insufficiency 08/29/2018  . Rib pain (Primary Area of Pain) (Right) 08/18/2018  . Incisional pain (Secondary Area of Pain) 08/18/2018  . Chronic pain syndrome 08/18/2018  . Long term current use of opiate analgesic 08/18/2018  . Pharmacologic therapy 08/18/2018  . Disorder of skeletal system 08/18/2018  . Problems influencing health status 08/18/2018  . Controlled substance agreement signed 06/28/2018  . Primary cancer of right lower lobe of lung ([place]) 05/27/2018  . Lung cancer ([place]) 05/16/2018  . Laryngeal cancer ([place]) 04/22/2018  . Mass of lower lobe of right lung 04/22/2018  . Vitamin B12 deficiency 12/15/2017  . Frequent falls 11/23/2017  . Mild cognitive impairment 11/23/2017  . Migraine without aura and without status migrainosus, not intractable 09/11/2016  . Hyperlipidemia   . COPD (chronic obstructive pulmonary disease) ([place])   . Severe depression ([place]) 12/24/2015  . Chronic anxiety 12/24/2015  . Coarse tremors 12/24/2015  . Lumbar facet syndrome 03/28/2015  . DDD (degenerative disc disease), lumbar 03/05/2015  . DDD (degenerative disc disease), lumbosacral 02/23/2015  . Sacroiliac joint dysfunction 02/23/2015  . Saphenous neuralgia 09/24/2014    Past Medical History:  Diagnosis Date  . Anemia    vitamin b12 deficiency.  no longer taking supplements  . Anxiety   . Arthritis   . Asthma   . Back pain   . Bruises easily   . Cancer ([place]) 2009   LARYNX CANCER - CHEMO / RADIATION (NO SURGERY)   . Chronic leg pain    BILATERAL  . COPD (chronic obstructive pulmonary disease) (HCC)    no inhalers for over 1 year  . Depression   . Difficulty sleeping   . Diverticulitis   . Dysrhythmia 04/2018   brady episodes. cleared by dr. Clayborn Bigness  . Facet syndrome, lumbar 03/28/2015  . Hemorrhoids   . History of kidney stones 04/2018   recently passed stone  . Hyperlipidemia   . Hypertension 2019   not on any treatment currently  . Incontinence of urine   . Laryngeal cancer ([place]) 2009  . Lung cancer ([place]) 04/2018   diagnosed via ct scan  . Lung cancer ([place]) 04/18/2018  . Migraine   . Migraine without aura and without status migrainosus, not intractable 09/11/2016  . Mild cognitive impairment 11/23/2017  . MRSA infection greater than 3 months ago    2008 right side of face  . Nerve damage    right leg. thinks this is from her TKR  . Nodule of lower lobe of right lung 04/22/2018  . Oxygen deficiency    2L/HS  . Wears dentures    full upper and lower     Transfusion: none   Consultants (if any):   Discharged Condition: Improved  Hospital Course: HARNOOR RETA is an 58 y.o. female who was admitted 11/29/2018 with a diagnosis of painful total knee, left knee instability and went to the operating room on  11/29/2018 and underwent the above named procedures.    Surgeries: Procedure(s): Left TOTAL KNEE REVISION on 11/29/2018 Patient tolerated the surgery well. Taken to PACU where she was stabilized and then transferred to the orthopedic floor.  Started on Lovenox 30 mg  q 12 hrs. Foot pumps applied bilaterally at 80 mm. Heels elevated on bed with rolled towels. No evidence of DVT. Negative [place]. Physical therapy started on day #1 for gait training and transfer. OT started day #1 for ADL and assisted  devices.  Patient's foley was d/c on day #1. Patient's IV  was d/c on day #2.  On post op day #3 patient was stable and ready for discharge to home with HHPT.  Implants: Medacta GM K sphere femur size 3 left, 2 fixed tibial baseplate with 65 mm stem and medial and lateral 5 mm augments 13 mm insert   She was given perioperative antibiotics:  Anti-infectives (From admission, onward)   Start     Dose/Rate Route Frequency Ordered Stop   11/29/18 1400  ceFAZolin (ANCEF) IVPB 2g/100 mL premix     2 g 200 mL/hr over 30 Minutes Intravenous Every 6 hours 11/29/18 1108 11/30/18 0242   11/29/18 0807  gentamicin (GARAMYCIN) 80 mg in sodium chloride 0.9 % 500 mL irrigation  Status:  Discontinued       As needed 11/29/18 0807 11/29/18 0932   11/29/18 0558  ceFAZolin (ANCEF) 2-4 GM/100ML-% IVPB    Note to Pharmacy:  Dewayne Hatch   : cabinet override      11/29/18 0558 11/29/18 1759   11/28/18 2215  ceFAZolin (ANCEF) IVPB 2g/100 mL premix     2 g 200 mL/hr over 30 Minutes Intravenous  Once 11/28/18 2211 11/29/18 0809    .  She was given sequential compression devices, early ambulation, and Lovenox for DVT prophylaxis.  Please remove provena negative pressure dressing on 12/09/2018 and apply honey comb dressing. Keep dressing clean and dry at all times.   She benefited maximally from the hospital stay and there were no complications.    Recent vital signs:  Vitals:   12/01/18 2309 12/02/18 0808  BP: 130/66 125/67  Pulse: 85 78  Resp: 19 18  Temp: 97.7 F (36.5 C) 98 F (36.7 C)  SpO2: 95% 99%    Recent laboratory studies:  Lab Results  Component Value Date   HGB 8.5 (L) 12/02/2018   HGB 8.4 (L) 12/01/2018   HGB 9.0 (L) 11/30/2018   Lab Results  Component Value Date   WBC 5.4 12/02/2018   PLT 169 12/02/2018   Lab Results  Component Value Date   INR 1.08 11/24/2018   Lab Results  Component Value Date   NA 139 12/02/2018   K 4.2 12/02/2018   CL 104 12/02/2018   CO2  32 12/02/2018   BUN 11 12/02/2018   CREATININE 0.57 12/02/2018   GLUCOSE 99 12/02/2018    Discharge Medications:   Allergies as of 12/02/2018      Reactions   Morphine And Related Itching   Can tolerate with benadryl   Cymbalta [duloxetine Hcl] Other (See Comments)   Pt states it keeps her awake   Adhesive [tape] Other (See Comments)   Plastic tape rips skin and bruises her. PLEASE USE PAPER TAPE   Lexapro [escitalopram Oxalate] Other (See Comments)   Keeps awake for days.       Medication List    STOP taking these medications   aspirin 81 MG tablet  TAKE these medications   albuterol 108 (90 Base) MCG/ACT inhaler Commonly known as:  PROVENTIL HFA;VENTOLIN HFA Inhale 2 puffs into the lungs every 4 (four) hours as needed for wheezing or shortness of breath.   azithromycin 250 MG tablet Commonly known as:  ZITHROMAX 2 tabs today then 1 tab daily for 4 days   clonazePAM 1 MG tablet Commonly known as:  KLONOPIN Take 1 tablet (1 mg total) by mouth at bedtime.   docusate sodium 100 MG capsule Commonly known as:  COLACE Take 1 capsule (100 mg total) by mouth 2 (two) times daily.   enoxaparin 40 MG/0.4ML injection Commonly known as:  LOVENOX Inject 0.4 mLs (40 mg total) into the skin daily.   FLUoxetine 20 MG capsule Commonly known as:  PROZAC Take 1 capsule (20 mg total) by mouth daily with breakfast. For mood   gabapentin 300 MG capsule Commonly known as:  NEURONTIN Take 1-3 capsules (300-900 mg total) by mouth 4 (four) times daily. Follow the written titration instructions. What changed:  when to take this   guaiFENesin-codeine 100-10 MG/5ML syrup Take 10 mLs by mouth 3 (three) times daily as needed for cough.   HYDROcodone-acetaminophen 7.5-325 MG tablet Commonly known as:  NORCO Take 1-2 tablets by mouth every 4 (four) hours as needed for moderate pain.   hydrOXYzine 25 MG capsule Commonly known as:  VISTARIL Take 1 capsule (25 mg total) by mouth 2  (two) times daily as needed. For severe anxiety symptoms   methylPREDNISolone 4 MG Tbpk tablet Commonly known as:  MEDROL DOSEPAK Take Tapered dose as directed   OXYGEN Inhale 2 L into the lungs at bedtime.   predniSONE 50 MG tablet Commonly known as:  DELTASONE Take 1 tablet (50 mg total) by mouth daily with breakfast.   QUEtiapine 25 MG tablet Commonly known as:  SEROQUEL Take 25 mg by mouth at bedtime.   BELSOMRA 20 MG Tabs Generic drug:  Suvorexant Take 20 mg by mouth at bedtime.   Suvorexant 15 MG Tabs Commonly known as:  BELSOMRA Take 15 mg by mouth at bedtime.   traMADol 50 MG tablet Commonly known as:  ULTRAM Take 1 tablet (50 mg total) by mouth every 6 (six) hours.   traZODone 150 MG tablet Commonly known as:  DESYREL Take 150 mg by mouth at bedtime.            Durable Medical Equipment  (From admission, onward)         Start     Ordered   11/29/18 1109  DME Walker rolling  Once    Question:  Patient needs a walker to treat with the following condition  Answer:  S/P revision of total knee, left   11/29/18 1108   11/29/18 1109  DME 3 n 1  Once     11/29/18 1108   11/29/18 1109  DME Bedside commode  Once    Question:  Patient needs a bedside commode to treat with the following condition  Answer:  S/P revision of total knee, left   11/29/18 1108          Diagnostic Studies: Dg Chest 2 View  Result Date: 11/12/2018 CLINICAL DATA:  Productive cough and congestion for 3 days. EXAM: CHEST - 2 VIEW COMPARISON:  Body CT 10/28/2018 FINDINGS: Cardiomediastinal silhouette is normal. Mediastinal contours appear intact. There is no evidence of focal airspace consolidation, pleural effusion or pneumothorax. Stable postsurgical changes from right lower lobe resection. Osseous structures are without acute  abnormality. Soft tissues are grossly normal. IMPRESSION: No active cardiopulmonary disease. Electronically Signed   By: Fidela Salisbury M.D.   On: 11/12/2018  16:02   Dg Knee 1-2 Views Left  Result Date: 11/29/2018 CLINICAL DATA:  58 year old female post left knee surgery. Initial encounter. EXAM: LEFT KNEE - 1-2 VIEW COMPARISON:  03/29/2010 FINDINGS: Post revision total left knee replacement which appears in satisfactory position without complication noted. IMPRESSION: Revision left total knee. Electronically Signed   By: Genia Del M.D.   On: 11/29/2018 10:17   Dg C-arm 1-60 Min-no Report  Result Date: 11/15/2018 Fluoroscopy was utilized by the requesting physician.  No radiographic interpretation.    Disposition:      Contact information for follow-up providers    Duanne Guess, PA-C Follow up in 14 day(s).   Specialties:  Orthopedic Surgery, Emergency Medicine Why:  Staple Removal. Contact information: [place] [address] [phone]            Contact information for after-discharge care    Destination    [facility] Preferred SNF .   Service:  Skilled Nursing Contact information: [address] [place] [phone]                   Signed: Feliberto Gottron 12/02/2018, 8:11 AM

## 2018-12-02 NOTE — Care Management (Signed)
Notified [name] with [facility] that patient decided not to go to SNF and will go home with [facility], she chooses [facility] for [facility].

## 2018-12-02 NOTE — Progress Notes (Signed)
Clinical Education officer, museum (CSW) met with patient to make her aware that she can D/C to [facility] today however patient is now refusing to go to SNF. Per patient she prefers to go home today. CSW asked what made patient changed her mind and she stated she wants to go home and her husband will be home all weekend with her. Per patient her husband works just a few minutes down the road and can come home if she needs him too during the week. CSW explained the risk of going home and patient continued to refuse SNF. Plan is for patient to D/C home today. RN, RN case Freight forwarder and Ortho PA aware of above. [facility] liaison is aware of above. Please reconsult if future social work needs arise. CSW signing off.   [employer], LCSW [phone]

## 2018-12-02 NOTE — Progress Notes (Signed)
Physical Therapy Treatment Patient Details Name: Susan Houston MRN: [id number] DOB: 1961/10/01 Today's Date: 12/02/2018    History of Present Illness Pt is a 58 y.o female s/p L total knee revision. Pt with PMH of mild cognitive impairment, lung CA, HTN, dysrrythmia, COPD, back pain, and anemia.     PT Comments    Pt progressed towards mobility goals this session as she was able to negotiate 4 steps. Orthostatics monitored (see general comments below), BP of no concern, pt denied dizziness throughout session. Min guard sup>sit. Min guard for STS, ambulation, and stair negotiation. Min A for LLE for sit>sup. Current plan of care at discharge remains appropriate.    Follow Up Recommendations  Supervision for mobility/OOB;SNF     Equipment Recommendations  Other (comment)(youth walker )    Recommendations for Other Services       Precautions / Restrictions Precautions Precautions: Fall;Knee Restrictions Weight Bearing Restrictions: Yes LLE Weight Bearing: Weight bearing as tolerated    Mobility  Bed Mobility Overal bed mobility: Needs Assistance Bed Mobility: Supine to Sit;Sit to Supine;Rolling Rolling: Min guard   Supine to sit: Min guard Sit to supine: Min assist   General bed mobility comments: Min guard for sup> sit. Min A for LLE for sit>sup. Min guard for rolling to assist NT to place sheet under pt.  Transfers Overall transfer level: Needs assistance Equipment used: (youth walker) Transfers: Sit to/from Stand Sit to Stand: Min guard         General transfer comment: Min guard for safety.  Ambulation/Gait Ambulation/Gait assistance: Min guard Gait Distance (Feet): 8 Feet Assistive device: (youth walker ) Gait Pattern/deviations: Step-to pattern;Decreased step length - right;Decreased step length - left;Decreased stance time - left;Decreased weight shift to left Gait velocity: decreased    General Gait Details: Pt ambulated to/from stairs from chair in ortho  gym   Stairs Stairs: Yes Stairs assistance: Min guard Stair Management: Two rails;Step to pattern;Forwards Number of Stairs: 4 General stair comments: SPT demonstrated prior to attempt. Pt able to verbalize sequence and able to negotiate stairs. At last step pt reports she felt like her knee was going to buckle, was assisted back to the chair.    Wheelchair Mobility    Modified Rankin (Stroke Patients Only)       Balance Overall balance assessment: Needs assistance Sitting-balance support: No upper extremity supported;Feet supported Sitting balance-Leahy Scale: Fair Sitting balance - Comments: Pt without use of UE support. If challenged pt would lose balance.    Standing balance support: Bilateral upper extremity supported;During functional activity Standing balance-Leahy Scale: Poor Standing balance comment: Pt required BUE support of RW to maintain balance with static and dynamic activity. SPT min guard for safety. [place] noted.                            Cognition Arousal/Alertness: Awake/alert Behavior During Therapy: WFL for tasks assessed/performed Overall Cognitive Status: Within Functional Limits for tasks assessed                                        Exercises      General Comments General comments (skin integrity, edema, etc.): BP in supine prior to OOB acitivity 143/75, sitting at EOB 134/70, standing 118/69, initially in supine following activity 139/56, in supine after 1 min 123/62. Pt denied dizziness throughout session.  Pertinent Vitals/Pain Pain Assessment: 0-10 Pain Score: 7  Pain Location: L knee  Pain Descriptors / Indicators: Grimacing;Guarding;Moaning Pain Intervention(s): Limited activity within patient's tolerance;Patient requesting pain meds-RN notified;Repositioned;Monitored during session;Ice applied    Home Living                      Prior Function            PT Goals (current goals can  now be found in the care plan section) Acute Rehab PT Goals Patient Stated Goal: to decrease pain PT Goal Formulation: With patient Time For Goal Achievement: 12/13/18 Potential to Achieve Goals: Good Progress towards PT goals: Progressing toward goals    Frequency    BID      PT Plan Current plan remains appropriate    Co-evaluation              AM-PAC PT "6 Clicks" Mobility   Outcome Measure  Help needed turning from your back to your side while in a flat bed without using bedrails?: A Little Help needed moving from lying on your back to sitting on the side of a flat bed without using bedrails?: A Little Help needed moving to and from a bed to a chair (including a wheelchair)?: A Little Help needed standing up from a chair using your arms (e.g., wheelchair or bedside chair)?: A Little Help needed to walk in hospital room?: A Little Help needed climbing 3-5 steps with a railing? : A Little 6 Click Score: 18    End of Session Equipment Utilized During Treatment: Gait belt Activity Tolerance: Patient tolerated treatment well Patient left: in bed;with call bell/phone within reach;with bed alarm set;with nursing/sitter in room;Other (comment)(polar care, towel roll) Nurse Communication: Mobility status;Other (comment)(BP) PT Visit Diagnosis: Unsteadiness on feet (R26.81);Other abnormalities of gait and mobility (R26.89);Muscle weakness (generalized) (M62.81);Pain Pain - Right/Left: Left Pain - part of body: Knee     Time: 4037-5436 PT Time Calculation (min) (ACUTE ONLY): 27 min  Charges:                         Dorothy Spark, SPT  12/02/2018, 2:03 PM

## 2018-12-02 NOTE — Progress Notes (Signed)
Patient has changed her mind and now wants to go to [facility]. Per patient she talked with her husband and now is agreeable to D/C to [facility] today. Patient is medically stable for D/C to [facility] today. Per Otila Kluver [facility] liaison [facility] authorization has been received and patient can come today to room 705. RN will call report and arrange EMS for transport. Clinical Education officer, museum (CSW) sent D/C orders to [facility] via [facility]. Patient is aware of above. CSW contacted patient's husband Jaquelyn Bitter and made him aware of above. Please reconsult if future social work needs arise. CSW signing off.   [employer], LCSW [phone]

## 2018-12-02 NOTE — Care Management (Signed)
Requested [place] orders from Rachelle Hora as patient declines SNF

## 2018-12-02 NOTE — Progress Notes (Signed)
Subjective: 3 Days Post-Op Procedure(s) (LRB): Left TOTAL KNEE REVISION (Left) Patient reports pain as moderate.   Patient is well, and has had no acute complaints or problems Denies any CP, SOB, ABD pain. We will continue therapy today.   Objective: Vital signs in last 24 hours: Temp:  [97.7 F (36.5 C)-98.4 F (36.9 C)] 97.7 F (36.5 C) (02/13 2309) Pulse Rate:  [83-85] 85 (02/13 2309) Resp:  [19] 19 (02/13 2309) BP: (130-134)/(66-70) 130/66 (02/13 2309) SpO2:  [95 %-99 %] 95 % (02/13 2309)  Intake/Output from previous day: 02/13 0701 - 02/14 0700 In: 1200 [P.O.:1200] Out: 5300 [Urine:5300] Intake/Output this shift: No intake/output data recorded.  Recent Labs    11/29/18 1138 11/30/18 0426 12/01/18 0457 12/02/18 0432  HGB 10.6* 9.0* 8.4* 8.5*   Recent Labs    12/01/18 0457 12/02/18 0432  WBC 5.3 5.4  RBC 3.12* 3.09*  HCT 27.6* 28.1*  PLT 159 169   Recent Labs    12/01/18 0457 12/02/18 0432  NA 139 139  K 3.9 4.2  CL 106 104  CO2 30 32  BUN 11 11  CREATININE 0.56 0.57  GLUCOSE 112* 99  CALCIUM 8.1* 8.3*   No results for input(s): LABPT, INR in the last 72 hours.  EXAM General - Patient is Alert, Appropriate and Oriented Extremity - Neurovascular intact Sensation intact distally Intact pulses distally Dorsiflexion/Plantar flexion intact No cellulitis present Compartment soft Dressing - dressing C/D/I and no drainage, Praveena dressing intact.  No drainage. Motor Function - intact, moving foot and toes well on exam.   Past Medical History:  Diagnosis Date  . Anemia    vitamin b12 deficiency. no longer taking supplements  . Anxiety   . Arthritis   . Asthma   . Back pain   . Bruises easily   . Cancer ([place]) 2009   LARYNX CANCER - CHEMO / RADIATION (NO SURGERY)   . Chronic leg pain    BILATERAL  . COPD (chronic obstructive pulmonary disease) (HCC)    no inhalers for over 1 year  . Depression   . Difficulty sleeping   .  Diverticulitis   . Dysrhythmia 04/2018   brady episodes. cleared by dr. Clayborn Bigness  . Facet syndrome, lumbar 03/28/2015  . Hemorrhoids   . History of kidney stones 04/2018   recently passed stone  . Hyperlipidemia   . Hypertension 2019   not on any treatment currently  . Incontinence of urine   . Laryngeal cancer ([place]) 2009  . Lung cancer ([place]) 04/2018   diagnosed via ct scan  . Lung cancer ([place]) 04/18/2018  . Migraine   . Migraine without aura and without status migrainosus, not intractable 09/11/2016  . Mild cognitive impairment 11/23/2017  . MRSA infection greater than 3 months ago    2008 right side of face  . Nerve damage    right leg. thinks this is from her TKR  . Nodule of lower lobe of right lung 04/22/2018  . Oxygen deficiency    2L/HS  . Wears dentures    full upper and lower    Assessment/Plan:   3 Days Post-Op Procedure(s) (LRB): Left TOTAL KNEE REVISION (Left) Active Problems:   S/P revision of total knee, left  Estimated body mass index is 35.77 kg/m as calculated from the following:   Height as of this encounter: 5\' 2"  (1.575 m).   Weight as of this encounter: 88.7 kg. Advance diet Up with therapy  Labs and vital signs  are stable Pain controlled Needs bowel movement Care management to assist with discharge pending BM  DVT Prophylaxis - Lovenox, TED hose and SCDs Weight-Bearing as tolerated to left leg   T. Rachelle Hora, PA-C [place] 12/02/2018, 8:08 AM

## 2018-12-02 NOTE — Discharge Instructions (Signed)
Diet: As you were doing prior to hospitalization   Shower:  May shower but keep the wounds dry, use an occlusive plastic wrap, NO SOAKING IN TUB.  If the bandage gets wet, change with a clean dry gauze.  Dressing:  Please remove provena negative pressure dressing on 12/09/2018 and apply honey comb dressing. Keep dressing clean and dry at all times.   Activity:  Increase activity slowly as tolerated, but follow the weight bearing instructions below.  No lifting or driving for 6 weeks.  Weight Bearing:   Weight bearing as tolerated to left lower extremity  To prevent constipation: you may use a stool softener such as -  Colace (over the counter) 100 mg by mouth twice a day  Drink plenty of fluids (prune juice may be helpful) and high fiber foods Miralax (over the counter) for constipation as needed.    Itching:  If you experience itching with your medications, try taking only a single pain pill, or even half a pain pill at a time.  You may take up to 10 pain pills per day, and you can also use benadryl over the counter for itching or also to help with sleep.   Precautions:  If you experience chest pain or shortness of breath - call 911 immediately for transfer to the hospital emergency department!!  If you develop a fever greater that 101 F, purulent drainage from wound, increased redness or drainage from wound, or calf pain-Call [place]                                              Follow- Up Appointment:  Please call for an appointment to be seen in 2 weeks at [facility]

## 2018-12-02 NOTE — Progress Notes (Signed)
Report called to [facility] @ [employer]. EMS called for transportation.

## 2018-12-04 LAB — AEROBIC/ANAEROBIC CULTURE W GRAM STAIN (SURGICAL/DEEP WOUND): Culture: NO GROWTH

## 2018-12-06 ENCOUNTER — Ambulatory Visit: Payer: Medicare HMO | Admitting: Gastroenterology

## 2018-12-06 DIAGNOSIS — M25562 Pain in left knee: Secondary | ICD-10-CM | POA: Diagnosis not present

## 2018-12-06 DIAGNOSIS — G8929 Other chronic pain: Secondary | ICD-10-CM | POA: Diagnosis not present

## 2018-12-06 DIAGNOSIS — Z96652 Presence of left artificial knee joint: Secondary | ICD-10-CM | POA: Diagnosis not present

## 2018-12-08 ENCOUNTER — Telehealth: Payer: Self-pay

## 2018-12-08 DIAGNOSIS — F5105 Insomnia due to other mental disorder: Secondary | ICD-10-CM

## 2018-12-08 NOTE — Telephone Encounter (Signed)
pt called states that she is in a rehab center because of knee surgery .. pt states she needs a refill on the belsomra 20mg  please call her .

## 2018-12-09 MED ORDER — SUVOREXANT 20 MG PO TABS: 20.0000 mg | ORAL_TABLET | Freq: Every day | ORAL | 1 refills | Status: DC

## 2018-12-09 NOTE — Telephone Encounter (Signed)
Spoke to patient , sent bel[facility] to [facility]. Pt states she cannot be here for her appointment on Monday. So will let lea know - she requests a call back.

## 2018-12-12 ENCOUNTER — Ambulatory Visit: Payer: Medicare HMO | Admitting: Psychiatry

## 2018-12-14 DIAGNOSIS — T84093D Other mechanical complication of internal left knee prosthesis, subsequent encounter: Secondary | ICD-10-CM | POA: Diagnosis not present

## 2018-12-14 DIAGNOSIS — G4700 Insomnia, unspecified: Secondary | ICD-10-CM | POA: Diagnosis not present

## 2018-12-14 DIAGNOSIS — M940 Chondrocostal junction syndrome [Tietze]: Secondary | ICD-10-CM | POA: Diagnosis not present

## 2018-12-14 DIAGNOSIS — Z8521 Personal history of malignant neoplasm of larynx: Secondary | ICD-10-CM | POA: Diagnosis not present

## 2018-12-14 DIAGNOSIS — J449 Chronic obstructive pulmonary disease, unspecified: Secondary | ICD-10-CM | POA: Diagnosis not present

## 2018-12-14 DIAGNOSIS — Z9981 Dependence on supplemental oxygen: Secondary | ICD-10-CM | POA: Diagnosis not present

## 2018-12-14 DIAGNOSIS — I10 Essential (primary) hypertension: Secondary | ICD-10-CM | POA: Diagnosis not present

## 2018-12-14 DIAGNOSIS — Z7982 Long term (current) use of aspirin: Secondary | ICD-10-CM | POA: Diagnosis not present

## 2018-12-14 DIAGNOSIS — M6281 Muscle weakness (generalized): Secondary | ICD-10-CM | POA: Diagnosis not present

## 2018-12-14 DIAGNOSIS — Z96652 Presence of left artificial knee joint: Secondary | ICD-10-CM | POA: Diagnosis not present

## 2018-12-15 ENCOUNTER — Telehealth: Payer: Self-pay | Admitting: Family Medicine

## 2018-12-15 NOTE — Telephone Encounter (Signed)
As long as she's doing OK she does not need to see me and can follow up when she's feeling a bit better.

## 2018-12-15 NOTE — Telephone Encounter (Signed)
Copied from [place] [id number]. Topic: Appointment Scheduling - Scheduling Inquiry for Clinic >> Dec 15, 2018 11:02 AM Alanda Slim E wrote: Reason for CRM: Pt has an appt on 3.2.20 with Jinny Blossom and wanted to know if it was a need for her to come in. Pt has had a knee replacement and wants to know if that can be pushed back or does Megan want to see her/ please advise

## 2018-12-15 NOTE — Telephone Encounter (Signed)
Spoke with Lattie Haw. She will call office in a couple of weeks to schedule appointment with Dr Wynetta Emery

## 2018-12-16 ENCOUNTER — Ambulatory Visit: Payer: Self-pay

## 2018-12-16 DIAGNOSIS — G4700 Insomnia, unspecified: Secondary | ICD-10-CM | POA: Diagnosis not present

## 2018-12-16 DIAGNOSIS — J449 Chronic obstructive pulmonary disease, unspecified: Secondary | ICD-10-CM | POA: Diagnosis not present

## 2018-12-16 DIAGNOSIS — Z8521 Personal history of malignant neoplasm of larynx: Secondary | ICD-10-CM | POA: Diagnosis not present

## 2018-12-16 DIAGNOSIS — M940 Chondrocostal junction syndrome [Tietze]: Secondary | ICD-10-CM | POA: Diagnosis not present

## 2018-12-16 DIAGNOSIS — I10 Essential (primary) hypertension: Secondary | ICD-10-CM | POA: Diagnosis not present

## 2018-12-16 DIAGNOSIS — M6281 Muscle weakness (generalized): Secondary | ICD-10-CM | POA: Diagnosis not present

## 2018-12-16 DIAGNOSIS — Z7982 Long term (current) use of aspirin: Secondary | ICD-10-CM | POA: Diagnosis not present

## 2018-12-16 DIAGNOSIS — Z96652 Presence of left artificial knee joint: Secondary | ICD-10-CM | POA: Diagnosis not present

## 2018-12-16 DIAGNOSIS — T84093D Other mechanical complication of internal left knee prosthesis, subsequent encounter: Secondary | ICD-10-CM | POA: Diagnosis not present

## 2018-12-16 DIAGNOSIS — Z9981 Dependence on supplemental oxygen: Secondary | ICD-10-CM | POA: Diagnosis not present

## 2018-12-19 ENCOUNTER — Telehealth: Payer: Self-pay | Admitting: Family Medicine

## 2018-12-19 ENCOUNTER — Inpatient Hospital Stay: Payer: Medicare HMO | Admitting: Family Medicine

## 2018-12-19 DIAGNOSIS — Z9981 Dependence on supplemental oxygen: Secondary | ICD-10-CM | POA: Diagnosis not present

## 2018-12-19 DIAGNOSIS — I10 Essential (primary) hypertension: Secondary | ICD-10-CM | POA: Diagnosis not present

## 2018-12-19 DIAGNOSIS — Z7982 Long term (current) use of aspirin: Secondary | ICD-10-CM | POA: Diagnosis not present

## 2018-12-19 DIAGNOSIS — J449 Chronic obstructive pulmonary disease, unspecified: Secondary | ICD-10-CM | POA: Diagnosis not present

## 2018-12-19 DIAGNOSIS — M940 Chondrocostal junction syndrome [Tietze]: Secondary | ICD-10-CM | POA: Diagnosis not present

## 2018-12-19 DIAGNOSIS — Z8521 Personal history of malignant neoplasm of larynx: Secondary | ICD-10-CM | POA: Diagnosis not present

## 2018-12-19 DIAGNOSIS — T84093D Other mechanical complication of internal left knee prosthesis, subsequent encounter: Secondary | ICD-10-CM | POA: Diagnosis not present

## 2018-12-19 DIAGNOSIS — Z96652 Presence of left artificial knee joint: Secondary | ICD-10-CM | POA: Diagnosis not present

## 2018-12-19 DIAGNOSIS — R8290 Unspecified abnormal findings in urine: Secondary | ICD-10-CM

## 2018-12-19 DIAGNOSIS — G4700 Insomnia, unspecified: Secondary | ICD-10-CM | POA: Diagnosis not present

## 2018-12-19 DIAGNOSIS — M6281 Muscle weakness (generalized): Secondary | ICD-10-CM | POA: Diagnosis not present

## 2018-12-19 NOTE — Telephone Encounter (Signed)
Copied from [place] [id number]. Topic: Appointment Scheduling - Scheduling Inquiry for Clinic >> Dec 19, 2018  1:30 PM Scherrie Gerlach wrote: Reason for CRM: emily with [facility] is with the pt today.  She reports pt's urine has foul odor and is very dark. Pt wants to know if her husbancd can come by and pick up speciman cup so that pt can do a UA. Pt had knee replacement and unable to come into the office. Please advise! Thanks! Call the pt at home.

## 2018-12-19 NOTE — Telephone Encounter (Signed)
Order is in. He can drop off a urine sample for her.

## 2018-12-19 NOTE — Telephone Encounter (Signed)
Message relayed to patient. Verbalized understanding and denied questions.   

## 2018-12-20 ENCOUNTER — Other Ambulatory Visit: Payer: Self-pay

## 2018-12-20 ENCOUNTER — Other Ambulatory Visit: Payer: Medicare Other

## 2018-12-20 ENCOUNTER — Telehealth: Payer: Self-pay | Admitting: Family Medicine

## 2018-12-20 DIAGNOSIS — R8290 Unspecified abnormal findings in urine: Secondary | ICD-10-CM

## 2018-12-20 NOTE — Telephone Encounter (Signed)
Pt returned call/ no one at office available/ Please call Pt tomorrow

## 2018-12-20 NOTE — Telephone Encounter (Signed)
Please let her know that her urine wasn't terrible- so I'm going to wait on the culture to see if she needs medicine.

## 2018-12-20 NOTE — Telephone Encounter (Signed)
Called and left patient a VM asking for her to please return my call.  

## 2018-12-20 NOTE — Telephone Encounter (Signed)
Pt requesting a call to discuss her urine results as she has some questions regarding the results.

## 2018-12-21 ENCOUNTER — Ambulatory Visit: Payer: Self-pay | Admitting: *Deleted

## 2018-12-21 DIAGNOSIS — J449 Chronic obstructive pulmonary disease, unspecified: Secondary | ICD-10-CM | POA: Diagnosis not present

## 2018-12-21 DIAGNOSIS — M6281 Muscle weakness (generalized): Secondary | ICD-10-CM | POA: Diagnosis not present

## 2018-12-21 DIAGNOSIS — T84093D Other mechanical complication of internal left knee prosthesis, subsequent encounter: Secondary | ICD-10-CM | POA: Diagnosis not present

## 2018-12-21 DIAGNOSIS — M940 Chondrocostal junction syndrome [Tietze]: Secondary | ICD-10-CM | POA: Diagnosis not present

## 2018-12-21 DIAGNOSIS — G4700 Insomnia, unspecified: Secondary | ICD-10-CM | POA: Diagnosis not present

## 2018-12-21 DIAGNOSIS — Z8521 Personal history of malignant neoplasm of larynx: Secondary | ICD-10-CM | POA: Diagnosis not present

## 2018-12-21 DIAGNOSIS — Z96652 Presence of left artificial knee joint: Secondary | ICD-10-CM | POA: Diagnosis not present

## 2018-12-21 DIAGNOSIS — Z7982 Long term (current) use of aspirin: Secondary | ICD-10-CM | POA: Diagnosis not present

## 2018-12-21 DIAGNOSIS — I10 Essential (primary) hypertension: Secondary | ICD-10-CM | POA: Diagnosis not present

## 2018-12-21 DIAGNOSIS — Z9981 Dependence on supplemental oxygen: Secondary | ICD-10-CM | POA: Diagnosis not present

## 2018-12-21 NOTE — Telephone Encounter (Signed)
Pt called with not urinating since last night about 10:30. She has had 4 bottles of 16 ounces of water and a cup of coffee today. She is having lower back pain. She is taking a medication for pain that she was prescribed from having her back done. She feels distended, suprapubic area discomfort. She denies fever.  Per protocol she should be seen at the ED.  Pt stated that she will go to [facility] when her husband gets off work at 60.  Advised that if she starts feeling worst to call back and go to the ED. Pt voiced understanding. Routing to flow at [facility].  Reason for Disposition . [1] Unable to urinate (or only a few drops) > 4 hours AND     [2] bladder feels very full (e.g., palpable bladder or strong urge to urinate)  Answer Assessment - Initial Assessment Questions 1. SYMPTOM: "What's the main symptom you're concerned about?" (e.g., frequency, incontinence)     Inability to urinate 2. ONSET: "When did the  Inability to urinate  start?"     This morning 3. PAIN: "Is there any pain?" If so, ask: "How bad is it?" (Scale: 1-10; mild, moderate, severe)     Lower back constant 4. CAUSE: "What do you think is causing the symptoms?"     UTI 5. OTHER SYMPTOMS: "Do you have any other symptoms?" (e.g., fever, flank pain, blood in urine, pain with urination)     Flank pain  6. PREGNANCY: "Is there any chance you are pregnant?" "When was your last menstrual period?"     N/a  Protocols used: URINARY [facility]

## 2018-12-21 NOTE — Telephone Encounter (Signed)
Patient notified. Routing back to provided for culture results.

## 2018-12-22 DIAGNOSIS — I10 Essential (primary) hypertension: Secondary | ICD-10-CM | POA: Diagnosis not present

## 2018-12-22 DIAGNOSIS — Z96652 Presence of left artificial knee joint: Secondary | ICD-10-CM | POA: Diagnosis not present

## 2018-12-22 DIAGNOSIS — C3490 Malignant neoplasm of unspecified part of unspecified bronchus or lung: Secondary | ICD-10-CM | POA: Diagnosis not present

## 2018-12-22 DIAGNOSIS — M940 Chondrocostal junction syndrome [Tietze]: Secondary | ICD-10-CM | POA: Diagnosis not present

## 2018-12-22 DIAGNOSIS — G4700 Insomnia, unspecified: Secondary | ICD-10-CM | POA: Diagnosis not present

## 2018-12-22 DIAGNOSIS — J449 Chronic obstructive pulmonary disease, unspecified: Secondary | ICD-10-CM | POA: Diagnosis not present

## 2018-12-22 DIAGNOSIS — Z7982 Long term (current) use of aspirin: Secondary | ICD-10-CM | POA: Diagnosis not present

## 2018-12-22 DIAGNOSIS — M6281 Muscle weakness (generalized): Secondary | ICD-10-CM | POA: Diagnosis not present

## 2018-12-22 DIAGNOSIS — Z96659 Presence of unspecified artificial knee joint: Secondary | ICD-10-CM | POA: Diagnosis not present

## 2018-12-22 DIAGNOSIS — T84093D Other mechanical complication of internal left knee prosthesis, subsequent encounter: Secondary | ICD-10-CM | POA: Diagnosis not present

## 2018-12-22 DIAGNOSIS — Z9981 Dependence on supplemental oxygen: Secondary | ICD-10-CM | POA: Diagnosis not present

## 2018-12-22 DIAGNOSIS — Z8521 Personal history of malignant neoplasm of larynx: Secondary | ICD-10-CM | POA: Diagnosis not present

## 2018-12-22 LAB — MICROSCOPIC EXAMINATION: RBC MICROSCOPIC, UA: NONE SEEN /HPF (ref 0–2)

## 2018-12-22 LAB — UA/M W/RFLX CULTURE, ROUTINE
Bilirubin, UA: NEGATIVE
Glucose, UA: NEGATIVE
Ketones, UA: NEGATIVE
NITRITE UA: NEGATIVE
Protein, UA: NEGATIVE
RBC, UA: NEGATIVE
Specific Gravity, UA: 1.01 (ref 1.005–1.030)
UUROB: 0.2 mg/dL (ref 0.2–1.0)
pH, UA: 5.5 (ref 5.0–7.5)

## 2018-12-22 LAB — URINE CULTURE, REFLEX

## 2018-12-22 MED ORDER — AMOXICILLIN 875 MG PO TABS: 875.0000 mg | ORAL_TABLET | Freq: Two times a day (BID) | ORAL | 0 refills | Status: DC

## 2018-12-22 NOTE — Telephone Encounter (Signed)
Noted  

## 2018-12-22 NOTE — Telephone Encounter (Signed)
She has a little bit of bacteria in her urine- I'm sending her through an antibiotic.

## 2018-12-22 NOTE — Telephone Encounter (Signed)
Patient notified

## 2018-12-23 ENCOUNTER — Telehealth: Payer: Self-pay | Admitting: Family Medicine

## 2018-12-23 ENCOUNTER — Other Ambulatory Visit: Payer: Self-pay

## 2018-12-23 NOTE — Telephone Encounter (Signed)
Copied from [place] [id number]. Topic: Quick Communication - See Telephone Encounter >> Dec 23, 2018  9:45 AM Reyne Dumas L wrote: CRM for notification. See Telephone encounter for: 12/23/18.  Erica with [employer] calling to resolve some medication discrepancies for this pt.   Danae Chen can be reached at [phone], please ask to speak with her.

## 2018-12-23 NOTE — Telephone Encounter (Signed)
Med list rec done w/ [facility] nurse.

## 2018-12-27 DIAGNOSIS — G4700 Insomnia, unspecified: Secondary | ICD-10-CM | POA: Diagnosis not present

## 2018-12-27 DIAGNOSIS — T84093D Other mechanical complication of internal left knee prosthesis, subsequent encounter: Secondary | ICD-10-CM | POA: Diagnosis not present

## 2018-12-27 DIAGNOSIS — J449 Chronic obstructive pulmonary disease, unspecified: Secondary | ICD-10-CM | POA: Diagnosis not present

## 2018-12-27 DIAGNOSIS — Z7982 Long term (current) use of aspirin: Secondary | ICD-10-CM | POA: Diagnosis not present

## 2018-12-27 DIAGNOSIS — M940 Chondrocostal junction syndrome [Tietze]: Secondary | ICD-10-CM | POA: Diagnosis not present

## 2018-12-27 DIAGNOSIS — M6281 Muscle weakness (generalized): Secondary | ICD-10-CM | POA: Diagnosis not present

## 2018-12-27 DIAGNOSIS — Z96652 Presence of left artificial knee joint: Secondary | ICD-10-CM | POA: Diagnosis not present

## 2018-12-27 DIAGNOSIS — Z96659 Presence of unspecified artificial knee joint: Secondary | ICD-10-CM | POA: Diagnosis not present

## 2018-12-27 DIAGNOSIS — I10 Essential (primary) hypertension: Secondary | ICD-10-CM | POA: Diagnosis not present

## 2018-12-27 DIAGNOSIS — Z9981 Dependence on supplemental oxygen: Secondary | ICD-10-CM | POA: Diagnosis not present

## 2018-12-27 DIAGNOSIS — C3490 Malignant neoplasm of unspecified part of unspecified bronchus or lung: Secondary | ICD-10-CM | POA: Diagnosis not present

## 2018-12-27 DIAGNOSIS — Z8521 Personal history of malignant neoplasm of larynx: Secondary | ICD-10-CM | POA: Diagnosis not present

## 2018-12-28 ENCOUNTER — Telehealth: Payer: Self-pay | Admitting: Family Medicine

## 2018-12-28 NOTE — Telephone Encounter (Signed)
Copied from [place] [phone]. Topic: Quick Communication - See Telephone Encounter >> Dec 28, 2018  3:58 PM Bea Graff, NT wrote: CRM for notification. See Telephone encounter for: 12/28/18. Pt states that a from from [facility] will be sent to the office for an order for a walker with a seat for her. She states they need this back as soon as possible.

## 2018-12-29 DIAGNOSIS — G4700 Insomnia, unspecified: Secondary | ICD-10-CM | POA: Diagnosis not present

## 2018-12-29 DIAGNOSIS — Z96652 Presence of left artificial knee joint: Secondary | ICD-10-CM | POA: Diagnosis not present

## 2018-12-29 DIAGNOSIS — Z7982 Long term (current) use of aspirin: Secondary | ICD-10-CM | POA: Diagnosis not present

## 2018-12-29 DIAGNOSIS — T84093D Other mechanical complication of internal left knee prosthesis, subsequent encounter: Secondary | ICD-10-CM | POA: Diagnosis not present

## 2018-12-29 DIAGNOSIS — M6281 Muscle weakness (generalized): Secondary | ICD-10-CM | POA: Diagnosis not present

## 2018-12-29 DIAGNOSIS — Z8521 Personal history of malignant neoplasm of larynx: Secondary | ICD-10-CM | POA: Diagnosis not present

## 2018-12-29 DIAGNOSIS — I10 Essential (primary) hypertension: Secondary | ICD-10-CM | POA: Diagnosis not present

## 2018-12-29 DIAGNOSIS — M940 Chondrocostal junction syndrome [Tietze]: Secondary | ICD-10-CM | POA: Diagnosis not present

## 2018-12-29 DIAGNOSIS — Z9981 Dependence on supplemental oxygen: Secondary | ICD-10-CM | POA: Diagnosis not present

## 2018-12-29 DIAGNOSIS — J449 Chronic obstructive pulmonary disease, unspecified: Secondary | ICD-10-CM | POA: Diagnosis not present

## 2018-12-29 NOTE — Telephone Encounter (Signed)
Please write up and I'll sign. Thanks!

## 2018-12-30 NOTE — Telephone Encounter (Signed)
Paper is in folder to be signed.

## 2019-01-02 ENCOUNTER — Telehealth: Payer: Self-pay | Admitting: Family Medicine

## 2019-01-02 NOTE — Telephone Encounter (Signed)
Needs an appointment if she's not better.

## 2019-01-02 NOTE — Telephone Encounter (Signed)
Copied from [place] [id number]. Topic: Quick Communication - See Telephone Encounter >> Jan 02, 2019  9:17 AM Margot Ables wrote: CRM for notification. See Telephone encounter for: 01/02/19. Pt called stating her urine is still very yellow and some odor. Pt completed amoxicillin 01/01/2019. Pt asking if her husband should bring up another urine specimen to drop off to be checked for infection. Please advise.

## 2019-01-02 NOTE — Telephone Encounter (Signed)
Called patient Susan Houston stated that Susan Houston felt fine just wanted to know if dr Wynetta Emery needed to check urine again since Susan Houston has finished the antibiotic. Spoke with Dr Wynetta Emery advised no since Susan Houston is not feeling bad.

## 2019-01-03 DIAGNOSIS — M25562 Pain in left knee: Secondary | ICD-10-CM | POA: Diagnosis not present

## 2019-01-03 DIAGNOSIS — Z96652 Presence of left artificial knee joint: Secondary | ICD-10-CM | POA: Diagnosis not present

## 2019-01-03 DIAGNOSIS — M6281 Muscle weakness (generalized): Secondary | ICD-10-CM | POA: Diagnosis not present

## 2019-01-03 DIAGNOSIS — M25662 Stiffness of left knee, not elsewhere classified: Secondary | ICD-10-CM | POA: Diagnosis not present

## 2019-01-04 ENCOUNTER — Ambulatory Visit: Payer: Self-pay | Admitting: Psychiatry

## 2019-01-04 DIAGNOSIS — Z96652 Presence of left artificial knee joint: Secondary | ICD-10-CM | POA: Diagnosis not present

## 2019-01-04 DIAGNOSIS — M6281 Muscle weakness (generalized): Secondary | ICD-10-CM | POA: Diagnosis not present

## 2019-01-04 DIAGNOSIS — J449 Chronic obstructive pulmonary disease, unspecified: Secondary | ICD-10-CM | POA: Diagnosis not present

## 2019-01-04 NOTE — Telephone Encounter (Signed)
Patient would like to know has the form been sent back to [facility] - please advise.

## 2019-01-05 ENCOUNTER — Telehealth: Payer: Self-pay

## 2019-01-05 NOTE — Telephone Encounter (Signed)
Message left from [place] @ [facility] [phone]) to call back to confirm that they had received RX.

## 2019-01-05 NOTE — Telephone Encounter (Signed)
Detailed message was left on Mrs. Ledger's VM after DPR was checked.

## 2019-01-05 NOTE — Telephone Encounter (Signed)
Yes- it was sent. Please confirm with [facility] that they got it

## 2019-01-05 NOTE — Telephone Encounter (Signed)
LM for patient for COVID-19 screen.

## 2019-01-06 ENCOUNTER — Telehealth: Payer: Self-pay

## 2019-01-06 ENCOUNTER — Ambulatory Visit: Payer: Medicare Other | Admitting: Internal Medicine

## 2019-01-06 DIAGNOSIS — F331 Major depressive disorder, recurrent, moderate: Secondary | ICD-10-CM

## 2019-01-06 DIAGNOSIS — Z96652 Presence of left artificial knee joint: Secondary | ICD-10-CM | POA: Diagnosis not present

## 2019-01-06 DIAGNOSIS — M6281 Muscle weakness (generalized): Secondary | ICD-10-CM | POA: Diagnosis not present

## 2019-01-06 MED ORDER — FLUOXETINE HCL 20 MG PO CAPS: 20.0000 mg | ORAL_CAPSULE | Freq: Every day | ORAL | 0 refills | Status: DC

## 2019-01-06 NOTE — Telephone Encounter (Signed)
Medication refill request - Telephone call with patient to make sure she wanted refill of Prozac to go to [facility] on [address] in [place].  Informed Dr. Shea Evans approved refill and order is being sent. New one time refill of patient's Prozac 20 mg, one daily with breakfast e-scribed to patient's [facility] per patient request and by order of Dr. Shea Evans this date.

## 2019-01-06 NOTE — Telephone Encounter (Signed)
Yes

## 2019-01-06 NOTE — Telephone Encounter (Signed)
Medication refill request - Telephone message from patient requesting a refill of her Prozac, last ordered 11/04/18 with no refills. Pt cancelled appt 2/24 and 3/18 after recent knee replacement.  Now scheduled for 02/08/19.

## 2019-01-10 DIAGNOSIS — M6281 Muscle weakness (generalized): Secondary | ICD-10-CM | POA: Diagnosis not present

## 2019-01-10 DIAGNOSIS — Z96652 Presence of left artificial knee joint: Secondary | ICD-10-CM | POA: Diagnosis not present

## 2019-01-11 ENCOUNTER — Telehealth: Payer: Self-pay | Admitting: Internal Medicine

## 2019-01-11 NOTE — Telephone Encounter (Signed)
LM for patient to call back with more details from her message.

## 2019-01-12 NOTE — Telephone Encounter (Signed)
lmtcb x2 for pt. 

## 2019-01-13 DIAGNOSIS — Z96652 Presence of left artificial knee joint: Secondary | ICD-10-CM | POA: Diagnosis not present

## 2019-01-13 DIAGNOSIS — M25562 Pain in left knee: Secondary | ICD-10-CM | POA: Diagnosis not present

## 2019-01-13 DIAGNOSIS — M25662 Stiffness of left knee, not elsewhere classified: Secondary | ICD-10-CM | POA: Diagnosis not present

## 2019-01-13 DIAGNOSIS — M6281 Muscle weakness (generalized): Secondary | ICD-10-CM | POA: Diagnosis not present

## 2019-01-13 DIAGNOSIS — M25362 Other instability, left knee: Secondary | ICD-10-CM | POA: Diagnosis not present

## 2019-01-13 NOTE — Telephone Encounter (Signed)
Pt came into office to schedule appt. Advised her that we have tried contacting her 3 times and let her know I would have a nurse give her a call.

## 2019-01-13 NOTE — Telephone Encounter (Signed)
lmtcb for pt.  

## 2019-01-13 NOTE — Telephone Encounter (Signed)
LM to call x 3. No answer, left VM. Per office protocol, message closed.

## 2019-01-16 ENCOUNTER — Telehealth: Payer: Medicare Other | Admitting: Gastroenterology

## 2019-01-16 ENCOUNTER — Ambulatory Visit: Payer: Medicare Other | Admitting: Gastroenterology

## 2019-01-16 ENCOUNTER — Telehealth: Payer: Self-pay

## 2019-01-16 ENCOUNTER — Telehealth: Payer: Self-pay | Admitting: Psychiatry

## 2019-01-16 ENCOUNTER — Ambulatory Visit (INDEPENDENT_AMBULATORY_CARE_PROVIDER_SITE_OTHER): Payer: Medicare Other | Admitting: Gastroenterology

## 2019-01-16 DIAGNOSIS — G4700 Insomnia, unspecified: Secondary | ICD-10-CM

## 2019-01-16 DIAGNOSIS — K5900 Constipation, unspecified: Secondary | ICD-10-CM | POA: Diagnosis not present

## 2019-01-16 DIAGNOSIS — R1319 Other dysphagia: Secondary | ICD-10-CM

## 2019-01-16 DIAGNOSIS — Z86010 Personal history of colonic polyps: Secondary | ICD-10-CM | POA: Diagnosis not present

## 2019-01-16 DIAGNOSIS — R1310 Dysphagia, unspecified: Secondary | ICD-10-CM | POA: Diagnosis not present

## 2019-01-16 MED ORDER — OMEPRAZOLE 40 MG PO CPDR: 40.0000 mg | DELAYED_RELEASE_CAPSULE | Freq: Every day | ORAL | 1 refills | Status: AC

## 2019-01-16 MED ORDER — SUVOREXANT 20 MG PO TABS: 20.0000 mg | ORAL_TABLET | Freq: Every day | ORAL | 1 refills | Status: DC

## 2019-01-16 NOTE — Telephone Encounter (Signed)
lmtcb x2 for pt. 

## 2019-01-16 NOTE — Progress Notes (Signed)
Susan Houston  [address]  [place]  [place], [place] [address]  Main: [phone]  Fax: [phone]   Gastroenterology Consultation  Referring Provider:     Cammie Sickle, * Primary Care Physician:  Valerie Roys, DO Reason for Consultation:   Constipation, history of colon polyps        HPI:   Virtual Visit via Telephone Note  I connected with patient on 01/16/19 at 10:45 AM EDT by telephone and verified that I am speaking with the correct person using two identifiers.   I discussed the limitations, risks, security and privacy concerns of performing an evaluation and management service by telephone and the availability of in person appointments. I also discussed with the patient that there may be a patient responsible charge related to this service. The patient expressed understanding and agreed to proceed.  Location of the patient: Home Location of provider: Home Participating persons: Patient and provider only   History of Present Illness: Constipation and history of colon polyps.  Susan Houston is a 58 y.o. y/o female referred for consultation & management  by Dr Rogue Bussing.  She sees him for laryngeal cancer and squamous cell lung cancer.  Presently no issues with constipation , she says she has a bowel movement every 4 days, Very hard, no blood. This has been ongoing for a year now. Has lost 25 lbs since feb 24 th . She says she has no idea why . Appetite is fair.  She also says she has some issues with swallowing , ongoing for a year, last EGD in 11/2016 . She says she does have some heart burn, Denies any medications. She takes some TUMS which does not help . She has had her esophagus stretched in the past a "couple of times" and feels better after. Last colonoscopy was 5-6 years back . Recalls she may have had some polyps.   She has some miralax, does not take regularly.    Last colonoscopy was in 2012.  Past Medical History:  Diagnosis Date  .  Anemia    vitamin b12 deficiency. no longer taking supplements  . Anxiety   . Arthritis   . Asthma   . Back pain   . Bruises easily   . Cancer ([place]) 2009   LARYNX CANCER - CHEMO / RADIATION (NO SURGERY)   . Chronic leg pain    BILATERAL  . COPD (chronic obstructive pulmonary disease) (HCC)    no inhalers for over 1 year  . Depression   . Difficulty sleeping   . Diverticulitis   . Dysrhythmia 04/2018   brady episodes. cleared by dr. [place] Bigness  . Facet syndrome, lumbar 03/28/2015  . Hemorrhoids   . History of kidney stones 04/2018   recently passed stone  . Hyperlipidemia   . Hypertension 2019   not on any treatment currently  . Incontinence of urine   . Laryngeal cancer ([place]) 2009  . Lung cancer ([place]) 04/2018   diagnosed via ct scan  . Lung cancer ([place]) 04/18/2018  . Migraine   . Migraine without aura and without status migrainosus, not intractable 09/11/2016  . Mild cognitive impairment 11/23/2017  . MRSA infection greater than 3 months ago    2008 right side of face  . Nerve damage    right leg. thinks this is from her TKR  . Nodule of lower lobe of right lung 04/22/2018  . Oxygen deficiency    2L/HS  . Wears dentures  full upper and lower    Past Surgical History:  Procedure Laterality Date  . ABDOMINAL HYSTERECTOMY  1983  . BLADDER SUSPENSION  2014  . CATARACT EXTRACTION W/PHACO Left 05/11/2018   Procedure: CATARACT EXTRACTION PHACO AND INTRAOCULAR LENS PLACEMENT ([place]) LEFT;  Surgeon: Leandrew Koyanagi, MD;  Location: [facility];  Service: Ophthalmology;  Laterality: Left;  PREFERS EARLY  . CATARACT EXTRACTION W/PHACO Right 08/11/2018   Procedure: CATARACT EXTRACTION PHACO AND INTRAOCULAR LENS PLACEMENT ([facility]);  Surgeon: Leandrew Koyanagi, MD;  Location: [facility];  Service: Ophthalmology;  Laterality: Right;  CDE 5:02 Total Time 01:35 Fluid lot # [id number]  . CHOLECYSTECTOMY  1998  . COLONOSCOPY    . ESOPHAGOGASTRODUODENOSCOPY  2015  .  ESOPHAGOGASTRODUODENOSCOPY (EGD) WITH PROPOFOL N/A 11/20/2016   Procedure: ESOPHAGOGASTRODUODENOSCOPY (EGD) WITH PROPOFOL;  Surgeon: Susan Bellows, MD;  Location: [facility];  Service: Endoscopy;  Laterality: N/A;  . EXCISION NEUROMA Bilateral 09/24/2014   Procedure: BILATERAL OPEN SAPHENOUS NEURECTOMIES AND OPEN LEFT PERIPATELLA OSETOPHYTECTOMY;  Surgeon: Mauri Pole, MD;  Location: [facility];  Service: Orthopedics;  Laterality: Bilateral;  . EYE SURGERY Left 05/11/2018   cataract extraction  . JOINT REPLACEMENT  2009/2010   BIL TOTAL KNEES  . THORACOTOMY/LOBECTOMY Right 05/16/2018   Procedure: THORACOTOMY/POSSIBLE LOBECTOMY;  Surgeon: Nestor Lewandowsky, MD;  Location: [facility];  Service: Thoracic;  Laterality: Right;  . TONSILLECTOMY    . TOTAL KNEE REVISION Left 11/29/2018   Procedure: Left TOTAL KNEE REVISION;  Surgeon: Hessie Knows, MD;  Location: [facility];  Service: Orthopedics;  Laterality: Left;  Marland Kitchen VIDEO BRONCHOSCOPY N/A 05/16/2018   Procedure: PREOP  BRONCHOSCOPY;  Surgeon: Nestor Lewandowsky, MD;  Location: [facility];  Service: Thoracic;  Laterality: N/A;    Prior to Admission medications   Medication Sig Start Date End Date Taking? Authorizing Provider  albuterol (PROVENTIL HFA;VENTOLIN HFA) 108 (90 Base) MCG/ACT inhaler Inhale 2 puffs into the lungs every 4 (four) hours as needed for wheezing or shortness of breath. 07/01/18   Flora Lipps, MD  clonazePAM (KLONOPIN) 1 MG tablet Take 1 tablet (1 mg total) by mouth at bedtime. 10/25/18   Johnson, Megan P, DO  docusate sodium (COLACE) 100 MG capsule Take 1 capsule (100 mg total) by mouth 2 (two) times daily. 12/02/18   Duanne Guess, PA-C  FLUoxetine (PROZAC) 20 MG capsule Take 1 capsule (20 mg total) by mouth daily with breakfast. For mood 01/06/19   Ursula Alert, MD  gabapentin (NEURONTIN) 300 MG capsule Take 1-3 capsules (300-900 mg total) by mouth 4 (four) times daily. Follow the written titration instructions. Patient taking differently:  Take 300-900 mg by mouth 2 (two) times daily. Follow the written titration instructions. 11/09/18 02/07/19  Milinda Pointer, MD  guaiFENesin-codeine 100-10 MG/5ML syrup Take 10 mLs by mouth 3 (three) times daily as needed for cough. Patient not taking: Reported on 11/24/2018 11/17/18   Guadalupe Maple, MD  HYDROcodone-acetaminophen (NORCO) 7.5-325 MG tablet Take 1-2 tablets by mouth every 4 (four) hours as needed for moderate pain. 12/01/18   Lattie Corns, PA-C  hydrOXYzine (VISTARIL) 25 MG capsule Take 1 capsule (25 mg total) by mouth 2 (two) times daily as needed. For severe anxiety symptoms 11/03/18   Ursula Alert, MD  OXYGEN Inhale 2 L into the lungs at bedtime.    [provider]  QUEtiapine (SEROQUEL) 25 MG tablet Take 25 mg by mouth at bedtime.  11/01/18   [provider]  traMADol (ULTRAM) 50 MG tablet Take 1 tablet (  50 mg total) by mouth every 6 (six) hours. 12/01/18   Lattie Corns, PA-C  traZODone (DESYREL) 150 MG tablet Take 150 mg by mouth at bedtime.    [provider]    Family History  Problem Relation Age of Onset  . Asthma Mother   . Diabetes Mother   . Hyperlipidemia Mother   . Hypertension Mother   . Cirrhosis Mother        Non-alcoholic  . Arthritis Father   . Cancer Father        Bone  . Hyperlipidemia Father   . Hypertension Father   . Anxiety disorder Sister   . Depression Sister   . Kidney Stones Son   . Stroke Maternal Grandmother   . Breast cancer Maternal Grandmother 26  . Cancer Maternal Grandfather        lung  . Pneumonia Paternal Grandmother   . Alzheimer's disease Paternal Grandfather   . Diabetes Sister   . Gout Sister   . Hypertension Sister   . Alcohol abuse Brother   . Heart disease Brother        massive MI     Social History   Tobacco Use  . Smoking status: Former Smoker    Packs/day: 0.50    Types: Cigarettes    Last attempt to quit: 04/07/2016    Years since quitting: 2.7  . Smokeless  tobacco: Never Used  . Tobacco comment: patient has not smoked x 10 days.06/28/17 chantix  Substance Use Topics  . Alcohol use: No    Alcohol/week: 0.0 standard drinks  . Drug use: No    Allergies as of 01/16/2019 - Review Complete 01/16/2019  Allergen Reaction Noted  . Morphine and related Itching 03/26/2017  . Cymbalta [duloxetine hcl] Other (See Comments) 09/16/2018  . Adhesive [tape] Other (See Comments) 05/12/2018  . Lexapro [escitalopram oxalate] Other (See Comments) 09/07/2014    Review of Systems:    All systems reviewed and negative except where noted in HPI.   Observations/Objective:  Labs: CBC    Component Value Date/Time   WBC 5.4 12/02/2018 0432   RBC 3.09 (L) 12/02/2018 0432   HGB 8.5 (L) 12/02/2018 0432   HGB 12.4 12/03/2017 1447   HCT 28.1 (L) 12/02/2018 0432   HCT 38.2 12/03/2017 1447   PLT 169 12/02/2018 0432   PLT 221 12/03/2017 1447   MCV 90.9 12/02/2018 0432   MCV 91 12/03/2017 1447   MCV 93 05/14/2014 1457   MCH 27.5 12/02/2018 0432   MCHC 30.2 12/02/2018 0432   RDW 14.3 12/02/2018 0432   RDW 14.2 12/03/2017 1447   RDW 13.3 05/14/2014 1457   LYMPHSABS 1.1 11/07/2018 1328   LYMPHSABS 1.2 12/03/2017 1447   LYMPHSABS 1.8 05/14/2014 1457   MONOABS 0.2 11/07/2018 1328   MONOABS 0.6 05/14/2014 1457   EOSABS 0.0 11/07/2018 1328   EOSABS 0.1 12/03/2017 1447   EOSABS 0.1 05/14/2014 1457   BASOSABS 0.0 11/07/2018 1328   BASOSABS 0.1 12/03/2017 1447   BASOSABS 0.1 05/14/2014 1457   CMP     Component Value Date/Time   NA 139 12/02/2018 0432   NA 137 12/03/2017 1447   NA 137 05/14/2014 1457   K 4.2 12/02/2018 0432   K 3.2 (L) 05/14/2014 1457   CL 104 12/02/2018 0432   CL 103 05/14/2014 1457   CO2 32 12/02/2018 0432   CO2 28 05/14/2014 1457   GLUCOSE 99 12/02/2018 0432   GLUCOSE 80 05/14/2014 1457  BUN 11 12/02/2018 0432   BUN 8 12/03/2017 1447   BUN 8 05/14/2014 1457   CREATININE 0.57 12/02/2018 0432   CREATININE 0.79 05/14/2014 1457    CALCIUM 8.3 (L) 12/02/2018 0432   CALCIUM 8.9 05/14/2014 1457   PROT 7.1 11/07/2018 1328   PROT 6.8 12/03/2017 1447   PROT 7.0 05/14/2014 1457   ALBUMIN 3.6 11/07/2018 1328   ALBUMIN 3.8 12/03/2017 1447   ALBUMIN 3.3 (L) 05/14/2014 1457   AST 15 11/07/2018 1328   AST 9 (L) 05/14/2014 1457   ALT 13 11/07/2018 1328   ALT 15 05/14/2014 1457   ALKPHOS 91 11/07/2018 1328   ALKPHOS 87 05/14/2014 1457   BILITOT 0.3 11/07/2018 1328   BILITOT 0.6 12/03/2017 1447   BILITOT 0.2 05/14/2014 1457   GFRNONAA >60 12/02/2018 0432   GFRNONAA >60 05/14/2014 1457   GFRAA >60 12/02/2018 0432   GFRAA >60 05/14/2014 1457    Imaging Studies: No results found.  Assessment and Plan:   JERIKA WALES is a 58 y.o. y/o female has been referred for constipation and history of colon polyps . Also has recurrence of dyshagia which has required esophageal dilation in the past . CT chest in 10/28/2018 shows no gross abnormality of the esophagus  Plan 1.  Colonoscopy+ EGD in 4-6 weeks since we are in the midst of a pandemic 2.  High-fiber diet patient information provided and discussed 3. Prilosec 40 mg daily to empiricaly treat any reflux  4. Miralax 1 capful daily  I have discussed alternative options, risks & benefits,  which include, but are not limited to, bleeding, infection, perforation,respiratory complication & drug reaction.  The patient agrees with this plan & written consent will be obtained.    Follow Up Instructions:   I discussed the assessment and treatment plan with the patient. The patient was provided an opportunity to ask questions and all were answered. The patient agreed with the plan and demonstrated an understanding of the instructions.   The patient was advised to call back or seek an in-person evaluation if the symptoms worsen or if the condition fails to improve as anticipated.  I provided 30 minutes of non-face-to-face time during this encounter.   Dr Susan Bellows MD,MRCP [facility])  Gastroenterology/Hepatology Pager: [phone]   Speech recognition software was used to dictate the above note.

## 2019-01-16 NOTE — Telephone Encounter (Signed)
pt states she dont know the name of the medication but it is the one that you started her out on 5mg  and now it is 20mg .

## 2019-01-16 NOTE — Telephone Encounter (Signed)
pt called states she called the pharmacy and they told her it was belsomra that you gave her. but i dont see it listed.

## 2019-01-16 NOTE — Patient Instructions (Signed)

## 2019-01-16 NOTE — Telephone Encounter (Signed)
Sent Belsomra to pharmacy

## 2019-01-16 NOTE — Telephone Encounter (Signed)
I sent it now

## 2019-01-16 NOTE — Telephone Encounter (Signed)
Called pt to triage for E-visit with Dr. Estill Bamberg to contact LVM to return call

## 2019-01-17 DIAGNOSIS — M25562 Pain in left knee: Secondary | ICD-10-CM | POA: Diagnosis not present

## 2019-01-17 DIAGNOSIS — Z96652 Presence of left artificial knee joint: Secondary | ICD-10-CM | POA: Diagnosis not present

## 2019-01-17 DIAGNOSIS — M6281 Muscle weakness (generalized): Secondary | ICD-10-CM | POA: Diagnosis not present

## 2019-01-17 DIAGNOSIS — M25662 Stiffness of left knee, not elsewhere classified: Secondary | ICD-10-CM | POA: Diagnosis not present

## 2019-01-17 NOTE — Telephone Encounter (Signed)
ATC 3rd time, goes straight to voicemail. Per office protocol, message closed.

## 2019-01-18 ENCOUNTER — Ambulatory Visit: Payer: Self-pay

## 2019-01-18 NOTE — Telephone Encounter (Signed)
I can see her for virtual visit and see if there's anything we can do- no other recommendations right now.

## 2019-01-18 NOTE — Telephone Encounter (Signed)
Spoke with Lattie Haw at this time she does not want to make virtual appt as she states she has not shown any of the signs.  She states she will either contact us if so or go to where she can be tested if she feels it is necessary.  Just FYI

## 2019-01-18 NOTE — Telephone Encounter (Signed)
Patient called and says where her husband works there were 3 workers there who were exposed at another location with a positive COVID-19 person about 2 weeks ago. 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 She says her husband's plant will close down tomorrow until 01/30/19. She says the only symptom she has is a wet productive cough of greenish sputum since 1 week ago, no fever, no other symptoms. She says her young granddaughter lives with her and her husband. She says she's concerned with her having COPD and on oxygen. She says she has not been out of the house, but she was in a facility for rehab from knee surgery until 01/10/19. Other that being in the hospital and rehab, she has not been out of the house. I advised home care advice, monitoring her symptoms, monitoring the granddaugter and husband symptoms. I advised if her SOB gets worse from her usual SOB with COPD, to go to the ED, she verbalized understanding. I advised I will send this to Dr. Wynetta Emery for any other recommendations she may have and if so, someone will call back with the recommendation.  Reason for Disposition . [1] Fever (or feeling feverish) OR cough AND [2] within 14 Days of COVID-19 EXPOSURE (Close Contact)  Answer Assessment - Initial Assessment Questions 1. CLOSE CONTACT: "Who is the person with the confirmed or suspected COVID-19 infection that you were exposed to?"     My husband exposed to people who were around a COVID-19 person 2. PLACE of CONTACT: "Where were you when you were exposed to COVID-19?" (e.g., home, school, medical waiting room; which city?)     His job, [employer], [place], [place] 3. TYPE of CONTACT: "How much contact was there?" (e.g., sitting next to, live in same house, work in same office, same building)     Work in same building, he's a Librarian, academic 4. DURATION of CONTACT:  "How long were you in contact with the COVID-19 patient?" (e.g., a few seconds, passed by person, a few minutes, live with the patient)     All last week and the first part of this week, they were sent home today when it was made aware to the managers 5. DATE of CONTACT: "When did you have contact with a COVID-19 patient?" (e.g., how many days ago)     Husband lives with me 6. TRAVEL: "Have you traveled out of the country recently?" If so, "When and where?"     * Also ask about out-of-state travel, since the CDC has identified some high risk cities for community spread in the [place].     * Note: Travel becomes less relevant if there is widespread community transmission where the patient lives.     No 7. COMMUNITY SPREAD: "Are there lots of cases or COVID-19 (community spread) where you live?" (See public health department website, if unsure)   * MAJOR community spread: high number of cases; numbers of cases are increasing; many people hospitalized.   * MINOR community spread: low number of cases; not increasing; few or no people hospitalized     I don't know, 8 cases in [facility]  8. SYMPTOMS: "Do you have any symptoms?" (e.g., fever, cough, breathing difficulty)     Productive cough of green sputum 9. PREGNANCY OR POSTPARTUM: "Is there any chance you are pregnant?" "When was your last menstrual  period?" "Did you deliver in the last 2 weeks?"     No 10. HIGH RISK: "Do you have any heart or lung problems? Do you have a weak immune system?" (e.g., CHF, COPD, asthma, HIV positive, chemotherapy, renal failure, diabetes mellitus, sickle cell anemia)       COPD, Emphysema, had lung cancer  Protocols used: CORONAVIRUS (COVID-19) EXPOSURE-A-AH

## 2019-01-19 ENCOUNTER — Encounter

## 2019-01-19 ENCOUNTER — Telehealth: Payer: Self-pay

## 2019-01-19 ENCOUNTER — Ambulatory Visit: Payer: Medicare Other | Admitting: Gastroenterology

## 2019-01-19 NOTE — Telephone Encounter (Signed)
Patient called back, let her know that she should continue her oxygen. Patient states she is using her oxygen sometimes during the day and is more short of breath than she was in September. I set patient up for phone visit for tomorrow afternoon.

## 2019-01-19 NOTE — Telephone Encounter (Signed)
Attempted to call patient to schedule phone follow-up visit.

## 2019-01-20 ENCOUNTER — Encounter: Payer: Self-pay | Admitting: Internal Medicine

## 2019-01-20 ENCOUNTER — Other Ambulatory Visit: Payer: Self-pay

## 2019-01-20 ENCOUNTER — Ambulatory Visit (INDEPENDENT_AMBULATORY_CARE_PROVIDER_SITE_OTHER): Payer: Medicare Other | Admitting: Internal Medicine

## 2019-01-20 DIAGNOSIS — J449 Chronic obstructive pulmonary disease, unspecified: Secondary | ICD-10-CM

## 2019-01-20 DIAGNOSIS — J9611 Chronic respiratory failure with hypoxia: Secondary | ICD-10-CM | POA: Diagnosis not present

## 2019-01-20 MED ORDER — ALBUTEROL SULFATE (2.5 MG/3ML) 0.083% IN NEBU: 2.5000 mg | INHALATION_SOLUTION | RESPIRATORY_TRACT | 12 refills | Status: AC | PRN

## 2019-01-20 NOTE — Progress Notes (Signed)
Name: Susan Houston MRN: [id number] DOB: 12-31-1960        TELEPHONE VISIT    In the setting of the current Covid19 crisis, you are scheduled for a  visit with me on 01/20/2019  Just as we do with many in-office visits, in order for you to participate in this visit, we must obtain consent.   I can obtain your verbal consent now.  PATIENT AGREES AND CONFIRMS -YES This Visit has Audio and Visual Capabilities for optimal patient care experience   Evaluation Performed:  Follow-up visit  This visit type was conducted due to national recommendations for restrictions regarding the COVID-19 Pandemic (e.g. social distancing).  This format is felt to be most appropriate for this patient at this time.  All issues noted in this document were discussed and addressed.     Virtual Visit via Telephone Note     I connected with patient on 01/20/2019 At 1120AM by telephone and verified that I am speaking with the correct person using two identifiers.   I discussed the limitations, risks, security and privacy concerns of performing an evaluation and management service by telephone and the availability of in person appointments. I also discussed with the patient that there may be a patient responsible charge related to this service. The patient expressed understanding and agreed to proceed.   Location of the patient: Home Location of provider: OFFICE Participating persons: Patient and provider only     STUDIES:     CXR independently reviewed by Me    7.9.19 CT chest Independently reviewed by Me RLL lung nodule seen  PFT 04/2018 Ratio 63% FEV1 76% FEF 25/75 41% Mild to moderate obstructive lung disease  Synopsis  58 yo Patient has initially had hematuria and found to have incidental finding of RLL nodule then follow up CT scan confirmed nodule on July 29 where she underwent a right thoracotomy and wedge resection of a right lower lobe mass.  The final pathology revealed a squamous cell  carcinoma.  She had previously had a head neck cancer treated with radiation therapy and chemotherapy several years ago.    hospital Course: She was admitted to the intensive care unit postoperatively where she was nursed overnight.  Her chest tube was removed on postoperative day #3.  She had some problems with hypoxia and she was treated with oxygen therapy and was discharged home on 2 L of oxygen therapy.   Patient followed up with pulmonary to assess for hypoxia and manage her COPD   CC follow up COPD and hypoxia  HISTORY OF PRESENT ILLNESS:  Patient has moderate COPD With chronic hypoxic respiratory failure In her at this time her symptoms seems to not under control She has intermittent wheezing and cough with some progressive shortness of breath She wears her oxygen as prescribed She uses and benefits from oxygen therapy  She has no signs of infection at this time  she has no signs of exacerbation at this time No indication for steroids  She is currently on Advair and Spiriva I have advised that she take albuterol nebulizers as needed every 4 hours which is new for her    PAST MEDICAL HISTORY :   has a past medical history of Anemia, Anxiety, Arthritis, Asthma, Back pain, Bruises easily, Cancer ([place]) (2009), Chronic leg pain, COPD (chronic obstructive pulmonary disease) ([place]), Depression, Difficulty sleeping, Diverticulitis, Dysrhythmia (04/2018), Facet syndrome, lumbar (03/28/2015), Hemorrhoids, History of kidney stones (04/2018), Hyperlipidemia, Hypertension (2019), Incontinence of urine, Laryngeal cancer (  [place]) (2009), Lung cancer ([place]) (04/2018), Lung cancer ([place]) (04/18/2018), Migraine, Migraine without aura and without status migrainosus, not intractable (09/11/2016), Mild cognitive impairment (11/23/2017), MRSA infection greater than 3 months ago, Nerve damage, Nodule of lower lobe of right lung (04/22/2018), Oxygen deficiency, and Wears dentures.  has a past surgical history that  includes Bladder suspension (2014); Tonsillectomy; Excision neuroma (Bilateral, 09/24/2014); Esophagogastroduodenoscopy (2015); Colonoscopy; Esophagogastroduodenoscopy (egd) with propofol (N/A, 11/20/2016); Joint replacement (2009/2010); Abdominal hysterectomy (1983); Eye surgery (Left, 05/11/2018); Cholecystectomy (1998); Cataract extraction w/PHACO (Left, 05/11/2018); Thoracotomy/lobectomy (Right, 05/16/2018); Video bronchoscopy (N/A, 05/16/2018); Cataract extraction w/PHACO (Right, 08/11/2018); and Total knee revision (Left, 11/29/2018). Prior to Admission medications   Medication Sig Start Date End Date Taking? Authorizing Provider  aspirin 81 MG tablet Take 81 mg by mouth daily.    [provider]       [provider]  gabapentin (NEURONTIN) 100 MG capsule Take 1 capsule (100 mg total) by mouth 3 (three) times daily. 05/23/18   Nestor Lewandowsky, MD  gabapentin (NEURONTIN) 100 MG capsule Take 1 capsule (100 mg total) by mouth 3 (three) times daily. 05/31/18   Nestor Lewandowsky, MD  magnesium hydroxide (MILK OF MAGNESIA) 400 MG/5ML suspension Take 5 mLs by mouth daily as needed for mild constipation or moderate constipation.    [provider]  methocarbamol (ROBAXIN) 500 MG tablet Take 500 mg by mouth every 6 (six) hours as needed.  05/20/18   [provider]  oxyCODONE-acetaminophen (PERCOCET) 7.5-325 MG tablet Take 1 tablet by mouth every 4 (four) hours as needed for severe pain. 05/31/18   Nestor Lewandowsky, MD  Prednisolon-Gatiflox-Bromfenac 1-0.5-0.075 % SUSP Apply 1 drop to eye 2 (two) times daily. 05/20/18   Nestor Lewandowsky, MD  SPIRIVA HANDIHALER 18 MCG inhalation capsule INL CONTENTS OF 1 C ONCE DAILY USING HANDIHALER 05/20/18   [provider]  tiZANidine (ZANAFLEX) 4 MG capsule Take 2 mg by mouth at bedtime.     [provider]  traMADol (ULTRAM) 50 MG tablet TK 1-2 TS PO Q 6 H 05/20/18   [provider]  traMADol (ULTRAM) 50 MG tablet Take 1 tablet (50 mg  total) by mouth 4 (four) times daily. 05/31/18 05/31/19  Nestor Lewandowsky, MD  traZODone (DESYREL) 50 MG tablet Take 25 mg by mouth at bedtime.     [provider]   ADVAIR 250/50 2 puffs twice daily  Allergies  Allergen Reactions  . Morphine And Related Itching    Can tolerate with benadryl  . Cymbalta [Duloxetine Hcl] Other (See Comments)    Pt states it keeps her awake  . Adhesive [Tape] Other (See Comments)    Plastic tape rips skin and bruises her. PLEASE USE PAPER TAPE  . Lexapro [Escitalopram Oxalate] Other (See Comments)    Keeps awake for days.      Review of Systems:  Gen:  Denies  fever, sweats, chills weigh loss  HEENT: Denies blurred vision, double vision, ear pain, eye pain, hearing loss, nose bleeds, sore throat Cardiac:  No dizziness, chest pain or heaviness, chest tightness,edema, No JVD Resp:  +cough, -sputum production, +shortness of breath,+wheezing, -hemoptysis,  Gi: Denies swallowing difficulty, stomach pain, nausea or vomiting, diarrhea, constipation, bowel incontinence Gu:  Denies bladder incontinence, burning urine Ext:   Denies Joint pain, stiffness or swelling Skin: Denies  skin rash, easy bruising or bleeding or hives Endoc:  Denies polyuria, polydipsia , polyphagia or weight change Psych:   Denies depression, insomnia or hallucinations  Other:  All  other systems negative        ASSESSMENT / PLAN: 58 yo pleasant white female s/p Resection of RLL mass c/w SQ cell carcinoma with mild to moderate COPD with chronic hypoxic resp failure   SOB and DOE-multifactorial  Related to morbid obesity deconditioned state and underlying COPD  Mild/Moderate COPD Gold Stage C Daily symptoms despite maximal triple therapy with ADvair/spiriva Will add NEBULIZED ALBUTEROL as needed every 4 hrs as needed I have dicussed possibel use of daily NEBS with Pulmicort and LABA and she does NOT want to try long acting nebs at this time  Chronic hypoxic respiratory  failure using 2 L at night Using 1 to 2 L with exertion during the day Patient uses and benefits from oxygen therapy and needs this for survival  Obesity -recommend significant weight loss -recommend changing diet  Deconditioned state -Recommend increased daily activity and exercise   SQ cell Cancer-follow up oncology     COVID-19 Education: The signs and symptoms of COVID-19 were discussed with the patient and how to seek care for testing (follow up with PCP or arrange E-visit).  The importance of social distancing was discussed today.  Follow-up in 3 months  Patient  satisfied with Plan of action and management. All questions answered  Corrin Parker, M.D.  [facility]  Medical Director [place] Director [facility] Cardio-Pulmonary Department    Total time spent 23 minutes

## 2019-01-20 NOTE — Patient Instructions (Signed)
Continue Advair and Spiriva as prescribed  We will add nebulized albuterol to her regimen every 4 hours as needed  Continue oxygen as prescribed

## 2019-01-20 NOTE — Addendum Note (Signed)
Addended by: Maryanna Shape A on: 01/20/2019 11:41 AM   Modules accepted: Orders

## 2019-01-25 ENCOUNTER — Other Ambulatory Visit: Payer: Self-pay

## 2019-01-25 ENCOUNTER — Ambulatory Visit: Payer: Medicare Other | Attending: Pain Medicine | Admitting: Pain Medicine

## 2019-01-25 DIAGNOSIS — L7682 Other postprocedural complications of skin and subcutaneous tissue: Secondary | ICD-10-CM | POA: Diagnosis not present

## 2019-01-25 DIAGNOSIS — G894 Chronic pain syndrome: Secondary | ICD-10-CM

## 2019-01-25 DIAGNOSIS — M940 Chondrocostal junction syndrome [Tietze]: Secondary | ICD-10-CM

## 2019-01-25 DIAGNOSIS — G8922 Chronic post-thoracotomy pain: Secondary | ICD-10-CM

## 2019-01-25 DIAGNOSIS — G8918 Other acute postprocedural pain: Secondary | ICD-10-CM

## 2019-01-25 DIAGNOSIS — R0782 Intercostal pain: Secondary | ICD-10-CM

## 2019-01-25 DIAGNOSIS — M792 Neuralgia and neuritis, unspecified: Secondary | ICD-10-CM

## 2019-01-25 DIAGNOSIS — R0789 Other chest pain: Secondary | ICD-10-CM | POA: Diagnosis not present

## 2019-01-25 DIAGNOSIS — M797 Fibromyalgia: Secondary | ICD-10-CM

## 2019-01-25 DIAGNOSIS — G588 Other specified mononeuropathies: Secondary | ICD-10-CM

## 2019-01-25 MED ORDER — GABAPENTIN 300 MG PO CAPS: 300.0000 mg | ORAL_CAPSULE | Freq: Four times a day (QID) | ORAL | 2 refills | Status: DC

## 2019-01-25 MED ORDER — PREGABALIN 25 MG PO CAPS: 25.0000 mg | ORAL_CAPSULE | Freq: Three times a day (TID) | ORAL | 0 refills | Status: DC

## 2019-01-25 NOTE — Patient Instructions (Signed)
____________________________________________________________________________________________  Gabapentin Titration  Medication used: Gabapentin (Generic Name) or Neurontin (Brand Name) 300 mg tablets/capsules  Reasons to stop increasing the dose:  Reason 1: You get good relief of symptoms, in which case there is no need to increase the daily dose any further.    Reason 2: You develop some side effects, such as sleeping all of the time, difficulty concentrating, or becoming disoriented, in which case you need to go down on the dose, to the prior level, where you were not experiencing any side effects. Stay on that dose longer, to allow more time for your body to get use it, before attempting to increase it again.   Reasons to stop increasing the dose: Reason 1: You get good relief of symptoms, in which case there is no need to increase the daily dose any further.  Reason 2: You develop some side effects, such as sleeping all of the time, difficulty concentrating, or becoming disoriented, in which case you need to go down on the dose, to the prior level, where you were not experiencing any side effects. Stay on that dose longer, to allow more time for your body to get use it, before attempting to increase it again.  Steps to increase medication: Step 1: Start by taking 1 (one) tablet at bedtime x 7 (seven) days.  Step 2: After 7 (seven) days of taking 1 (one) tablet at bedtime, increase it to 2 (two) tablets at bedtime. Stay on this dose x 7 (seven) days.  Step 3: After 7 (seven) days of taking 2 (two) tablets at bedtime, increase it to 3 (three) tablets at bedtime. Stay on this dose x another 7 (seven) days.  Step 4: After 7 (seven) days of taking 3 (three) tablet at bedtime, begin taking 1 (one) tablet at noon with lunch. Stay on this dose x another 7 (seven) days.  Step 5: After 7 (seven) days of taking 3 (three) tablet at bedtime, and 1 (one) tablet at noon, then begin taking 1 (one)  tablet in the afternoon with dinner. Stay on this dose x another 7 (seven) days.  Step 6: After 7 (seven) days of taking 3 (three) tablet at bedtime, 1 (one) tablet at noon, and 1 (one) tablet in the afternoon, then begin taking 1 (one) tablet in the morning with breakfast. Stay on this dose x another 7 (seven) days. At this point you should be taking the medicine 4 (four) times a day, or about every 6 (six) hours. This daily regimen of taking the medicine 4 (four) times a day, will be maintained from now on. You should not take any doses any sooner than every 6 (six) hours.  Step 7: After 7 (seven) days of taking 3 (three) tablet at bedtime, 1 (one) tablet at noon, 1 (one) tablet in the afternoon, and 1 (one) tablet in the morning, begin taking 2 (two) tablets at noon with lunch. Stay on this dose x another 7 (seven) days.   Step 8: After 7 (seven) days of taking 3 (three) tablet at bedtime, 2 (two) tablets at noon, 1 (one) tablet in the afternoon, and 1 (one) tablet in the morning, begin taking 2 (two) tablets in the afternoon with dinner. Stay on this dose x another 7 (seven) days.   Step 9: After 7 (seven) days of taking 3 (three) tablet at bedtime, 2 (two) tablets at noon, 2 (two) tablets in the afternoon, and 1 (one) tablet in the morning, begin taking 2 (two) tablets in  the morning with breakfast. Stay on this dose x another 7 (seven) days. At this point you should be taking the medicine 4 (four) times a day, or about every 6 (six) hours. This daily regimen of taking the medicine 4 (four) times a day, will be maintained from now on. You should not take any doses any sooner than every 6 (six) hours.  Step 10: After 7 (seven) days of taking 3 (three) tablet at bedtime, 2 (two) tablets at noon, 2 (two) tablets in the afternoon, and 2 (two) tablets in the morning, begin taking 3 (three) tablets at noon with lunch. Stay on this dose x another 7 (seven) days.   Step 11: After 7 (seven) days of taking 3  (three) tablet at bedtime, 3 (three) tablets at noon, 2 (two) tablets in the afternoon, and 2 (two) tablets in the morning, begin taking 3 (three) tablets in the afternoon with dinner. Stay on this dose x another 7 (seven) days.   Step 12: After 7 (seven) days of taking 3 (three) tablet at bedtime, 3 (three) tablets at noon, 3 (three) tablets in the afternoon, and 2 (two) tablet in the morning, begin taking 3 (three) tablets in the morning with breakfast. Stay on this dose x another 7 (seven) days. At this point you should be taking the medicine 4 (four) times a day, or about every 6 (six) hours. This daily regimen of taking the medicine 4 (four) times a day, will be maintained from now on.   Endpoint: Once you have reached the maximum dose you can tolerate without side-effects, contact your physician so as to evaluate the results of the regimen.   Questions: Feel free to contact us for any questions or problems at [phone] ____________________________________________________________________________________________

## 2019-01-25 NOTE — Progress Notes (Signed)
Pain Management Encounter Note - Virtual Visit via Telephone Telehealth (real-time audio visits between healthcare provider and patient).  Patient's Phone No. & Preferred Pharmacy:  [phone] (home); [phone] (mobile); (Preferred) [facility] [place] [address] Phone: [phone] Fax: [phone]   Pre-screening note:  Our staff contacted Susan [place] and offered her an "in person", "face-to-face" appointment versus a telephone encounter. She indicated preferring the telephone encounter, at this time.  Reason for Virtual Visit: COVID-19*  Social distancing based on CDC and AMA recommendations.   I contacted Susan [place] on 01/25/2019 at 4:29 PM by telephone and clearly identified myself as Gaspar Cola, MD. I verified that I was speaking with the correct person using two identifiers (Name and date of birth: 1961/03/21).  Advanced Informed Consent I sought verbal advanced consent from Susan Houston for telemedicine interactions and virtual visit. I informed Susan Houston of the security and privacy concerns, risks, and limitations associated with performing an evaluation and management service by telephone. I also informed Susan [place] of the availability of "in person" appointments and I informed her of the possibility of a patient responsible charge related to this service. Susan Houston expressed understanding and agreed to proceed.   Historic Elements   [name] is a 58 y.o. year old, female patient evaluated today after her last encounter by our practice on 11/16/2018. Susan [place]  has a past medical history of Anemia, Anxiety, Arthritis, Asthma, Back pain, Bruises easily, Cancer ([place]) (2009), Chronic leg pain, COPD (chronic obstructive pulmonary disease) ([place]), Depression, Difficulty sleeping, Diverticulitis, Dysrhythmia (04/2018), Facet syndrome, lumbar (03/28/2015), Hemorrhoids,  History of kidney stones (04/2018), Hyperlipidemia, Hypertension (2019), Incontinence of urine, Laryngeal cancer ([place]) (2009), Lung cancer ([place]) (04/2018), Lung cancer ([place]) (04/18/2018), Migraine, Migraine without aura and without status migrainosus, not intractable (09/11/2016), Mild cognitive impairment (11/23/2017), MRSA infection greater than 3 months ago, Nerve damage, Nodule of lower lobe of right lung (04/22/2018), Oxygen deficiency, and Wears dentures. She also  has a past surgical history that includes Bladder suspension (2014); Tonsillectomy; Excision neuroma (Bilateral, 09/24/2014); Esophagogastroduodenoscopy (2015); Colonoscopy; Esophagogastroduodenoscopy (egd) with propofol (N/A, 11/20/2016); Joint replacement (2009/2010); Abdominal hysterectomy (1983); Eye surgery (Left, 05/11/2018); Cholecystectomy (1998); Cataract extraction w/PHACO (Left, 05/11/2018); Thoracotomy/lobectomy (Right, 05/16/2018); Video bronchoscopy (N/A, 05/16/2018); Cataract extraction w/PHACO (Right, 08/11/2018); and Total knee revision (Left, 11/29/2018). Susan [place] has a current medication list which includes the following prescription(s): albuterol, albuterol, clonazepam, docusate sodium, fluoxetine, guaifenesin-codeine, hydrocodone-acetaminophen, hydroxyzine, omeprazole, oxygen-helium, pregabalin, quetiapine, suvorexant, tramadol, and trazodone, and the following Facility-Administered Medications: ipratropium-albuterol. She  reports that she quit smoking about 2 years ago. Her smoking use included cigarettes. She smoked 0.50 packs per day. She has never used smokeless tobacco. She reports that she does not drink alcohol or use drugs. Susan [place] is allergic to morphine and related; cymbalta [duloxetine hcl]; adhesive [tape]; and lexapro [escitalopram oxalate].   HPI  I last saw her on 11/15/2018. She is being evaluated for a post-procedure assessment.  The patient indicates having attained complete relief of the symptoms while the local  anesthetic was in place.  Unfortunately as the localized that it wore off the pain returned.  In the past I had talked to this patient about the possibility of doing radiofrequency ablation of the intercostal nerves, but she has avoided doing this.  Today I had a long conversation regarding this again  and I have explained to the patient that it is very likely that the pain that she is experiencing is secondary to an entrapment neuropathy of the intercostal nerves as a consequence of her prior thoracotomy surgery.  Because she did not obtain any long-term benefit from the steroids, it is not likely to have an inflammatory component to it.  I have explained to the patient that this is a type of neuropathic pain and therefore the only medications available would be either the Neurontin or the Lyrica.  We had already started the patient on Neurontin and I had given her some very specific written instructions on how to increase the dose.  Unfortunately, she seems to have lost the written instructions and rather than going up to 900 mg 4 times daily, she continued to take the medication 3 times daily and has gone up to 1500 mg 3 times daily for a total of 4500 mg/day.  She refers not having any benefit from this.  Because Neurontin is associated with a peak call pulling of the dose-effect curve, it is very likely that this is what has happened in she has probably plateaued in terms of the benefits.  In view of this, we will go ahead and taper the medication down and start the patient back on Lyrica to do a trial of this pregabalin.  I have again provided the patient with instructions on how to make this change and I have informed her that I will be given her the prescription for the Lyrica 25 mg to be taken every 8 hours.  We will reevaluate her case in 30 days and determine if further increases are needed.  Post-Procedure Evaluation  Procedure: Diagnostic/therapeutic right-sided intercostal nerve blocks #4 +  costochondral injection under fluoroscopic guidance and IV sedation Pre-procedure pain level:  7/10 Post-procedure: 0/10 (100% relief)  Sedation: Sedation provided.  Effectiveness during initial hour after procedure(Ultra-Short Term Relief): 100 %  Local anesthetic used: Long-acting (4-6 hours) Effectiveness: Defined as any analgesic benefit obtained secondary to the administration of local anesthetics. This carries significant diagnostic value as to the etiological location, or anatomical origin, of the pain. Duration of benefit is expected to coincide with the duration of the local anesthetic used.  Effectiveness during initial 4-6 hours after procedure(Short-Term Relief): 100 %  Long-term benefit: Defined as any relief past the pharmacologic duration of the local anesthetics.  Effectiveness past the initial 6 hours after procedure(Long-Term Relief): 0 %  Current benefits: Defined as benefit that persist at this time.   Analgesia:  Back to baseline Function: Back to baseline ROM: Back to baseline  Review of recent tests  DG Knee 1-2 Views Left CLINICAL DATA:  58 year old female post left knee surgery. Initial encounter.  EXAM: LEFT KNEE - 1-2 VIEW  COMPARISON:  03/29/2010  FINDINGS: Post revision total left knee replacement which appears in satisfactory position without complication noted.  IMPRESSION: Revision left total knee.  Electronically Signed   By: Genia Del M.D.   On: 11/29/2018 10:17   Orders Only on 12/20/2018  Component Date Value Ref Range Status  . Specific Gravity, UA 12/20/2018 1.010  1.005 - 1.030 Final  . pH, UA 12/20/2018 5.5  5.0 - 7.5 Final  . Color, UA 12/20/2018 Yellow  Yellow Final  . Appearance Ur 12/20/2018 Clear  Clear Final  . Leukocytes, UA 12/20/2018 Trace* Negative Final  . Protein, UA 12/20/2018 Negative  Negative/Trace Final  . Glucose, UA 12/20/2018 Negative  Negative Final  . Ketones,  UA 12/20/2018 Negative  Negative Final  .  RBC, UA 12/20/2018 Negative  Negative Final  . Bilirubin, UA 12/20/2018 Negative  Negative Final  . Urobilinogen, Ur 12/20/2018 0.2  0.2 - 1.0 mg/dL Final  . Nitrite, UA 12/20/2018 Negative  Negative Final  . Microscopic Examination 12/20/2018 See below:   Final  . Urinalysis Reflex 12/20/2018 Comment   Final   This specimen has reflexed to a Urine Culture.  . WBC, UA 12/20/2018 0-5  0 - 5 /hpf Final  . RBC, UA 12/20/2018 None seen  0 - 2 /hpf Final  . Epithelial Cells (non renal) 12/20/2018 0-10  0 - 10 /hpf Final  . Bacteria, UA 12/20/2018 Moderate* None seen/Few Final  . Urine Culture, Routine 12/20/2018 Final report*  Final  . Organism ID, Bacteria 12/20/2018 Comment*  Final   Comment: Beta hemolytic Streptococcus, group B 10,000-25,000 colony forming units per mL Penicillin and ampicillin are drugs of choice for treatment of beta-hemolytic streptococcal infections. Susceptibility testing of penicillins and other beta-lactam agents approved by the FDA for treatment of beta-hemolytic streptococcal infections need not be performed routinely because nonsusceptible isolates are extremely rare in any beta-hemolytic streptococcus and have not been reported for Streptococcus pyogenes (group A). (CLSI)    Assessment  The primary encounter diagnosis was Chronic pain syndrome. Diagnoses of Chronic chest wall pain following surgery (Right), Chronic post-thoracotomy pain, Costochondritis, Incisional pain (Secondary Area of Pain), Intercostal neuralgia, Intercostal pain (Right), Neurogenic pain, and Fibromyalgia syndrome were also pertinent to this visit.  Plan of Care  I have discontinued Shaylea C. Aicher's gabapentin and gabapentin. I am also having her start on pregabalin. Additionally, I am having her maintain her albuterol, clonazePAM, hydrOXYzine, guaiFENesin-codeine, QUEtiapine, traZODone, OXYGEN, HYDROcodone-acetaminophen, traMADol, docusate sodium, FLUoxetine, omeprazole, Suvorexant, and  albuterol.  Pharmacotherapy (Medications Ordered): Meds ordered this encounter  Medications  . DISCONTD: gabapentin (NEURONTIN) 300 MG capsule    Sig: Take 1-3 capsules (300-900 mg total) by mouth 4 (four) times daily. Follow the written titration instructions.    Dispense:  360 capsule    Refill:  2    Do not place medication on "Automatic Refill". Fill one day early if pharmacy is closed on scheduled refill date.  . pregabalin (LYRICA) 25 MG capsule    Sig: Take 1 capsule (25 mg total) by mouth 3 (three) times daily for 30 days.    Dispense:  90 capsule    Refill:  0    Do not place this medication, or any other prescription from our practice, on "Automatic Refill". Patient may have prescription filled one day early if pharmacy is closed on scheduled refill date.   Orders:  No orders of the defined types were placed in this encounter.  Follow-up plan:   Return in about 1 month (around 02/24/2019) for Med-Mgmt, w/ Dr. Dossie Arbour, (Virtual).   I discussed the assessment and treatment plan with the patient. The patient was provided an opportunity to ask questions and all were answered. The patient agreed with the plan and demonstrated an understanding of the instructions.  Patient advised to call back or seek an in-person evaluation if the symptoms or condition worsens.  Total duration of non-face-to-face encounter: 15 minutes.  Note by: Gaspar Cola, MD Date: 01/25/2019; Time: 4:29 PM  Disclaimer:  * Given the special circumstances of the COVID-19 pandemic, the federal government has announced that the Office for Civil Rights (OCR) will exercise its enforcement discretion and will not impose penalties on physicians using telehealth  in the event of noncompliance with regulatory requirements under the [employer] and Accountability Act (HIPAA) in connection with the good faith provision of telehealth during the UVOZD-66 national public health emergency. (AMA)

## 2019-01-26 ENCOUNTER — Encounter: Payer: Self-pay | Admitting: Family Medicine

## 2019-01-26 ENCOUNTER — Ambulatory Visit (INDEPENDENT_AMBULATORY_CARE_PROVIDER_SITE_OTHER): Payer: Medicare Other | Admitting: Family Medicine

## 2019-01-26 ENCOUNTER — Other Ambulatory Visit: Payer: Self-pay

## 2019-01-26 VITALS — Ht 62.0 in

## 2019-01-26 DIAGNOSIS — C3431 Malignant neoplasm of lower lobe, right bronchus or lung: Secondary | ICD-10-CM | POA: Diagnosis not present

## 2019-01-26 DIAGNOSIS — F419 Anxiety disorder, unspecified: Secondary | ICD-10-CM | POA: Diagnosis not present

## 2019-01-26 DIAGNOSIS — G3184 Mild cognitive impairment, so stated: Secondary | ICD-10-CM

## 2019-01-26 NOTE — Progress Notes (Signed)
Ht 5\' 2"  (1.575 m)   BMI 35.77 kg/m    Subjective:    Patient ID: Susan [place], female    DOB: [birth year], 58 y.o.   MRN: [id number]  HPI: Susan [place] is a 58 y.o. female  Chief Complaint  Patient presents with  . Paperwork    DMV   Years ago she was on medication that she couldn't drive on it. She has not been off that medicine for years, but notes that the [facility] makes her fill out forms annually. She is driving. Having no issues. Some SOB due to the surgery, but no dizziness. She is sleeping again. Seeing psychiatry and doing well. No other concerns or complaints at this time.   Relevant past medical, surgical, family and social history reviewed and updated as indicated. Interim medical history since our last visit reviewed. Allergies and medications reviewed and updated.  Review of Systems  Constitutional: Negative.   HENT: Negative.   Respiratory: Positive for cough and shortness of breath. Negative for apnea, choking, chest tightness, wheezing and stridor.   Cardiovascular: Negative.   Gastrointestinal: Negative.   Neurological: Negative.   Psychiatric/Behavioral: Negative.     Per HPI unless specifically indicated above     Objective:    Ht 5\' 2"  (1.575 m)   BMI 35.77 kg/m   Wt Readings from Last 3 Encounters:  11/29/18 195 lb 8.8 oz (88.7 kg)  11/24/18 195 lb (88.5 kg)  11/15/18 195 lb (88.5 kg)    Physical Exam Vitals signs and nursing note reviewed.  Constitutional:      General: She is not in acute distress.    Appearance: Normal appearance. She is not ill-appearing, toxic-appearing or diaphoretic.  HENT:     Head: Normocephalic and atraumatic.     Right Ear: External ear normal.     Left Ear: External ear normal.     Nose: Nose normal.     Mouth/Throat:     Mouth: Mucous membranes are moist.     Pharynx: Oropharynx is clear.  Eyes:     General: No scleral icterus.       Right eye: No discharge.        Left eye: No discharge.   Conjunctiva/sclera: Conjunctivae normal.     Pupils: Pupils are equal, round, and reactive to light.  Neck:     Musculoskeletal: Normal range of motion.  Pulmonary:     Effort: Pulmonary effort is normal. No respiratory distress.     Comments: Speaking in full sentences Musculoskeletal: Normal range of motion.  Skin:    Coloration: Skin is not jaundiced or pale.     Findings: No bruising, erythema, lesion or rash.  Neurological:     Mental Status: She is alert and oriented to person, place, and time. Mental status is at baseline.  Psychiatric:        Mood and Affect: Mood normal.        Behavior: Behavior normal.        Thought Content: Thought content normal.        Judgment: Judgment normal.     Results for orders placed or performed in visit on 12/20/18  Microscopic Examination  Result Value Ref Range   WBC, UA 0-5 0 - 5 /hpf   RBC, UA None seen 0 - 2 /hpf   Epithelial Cells (non renal) 0-10 0 - 10 /hpf   Bacteria, UA Moderate (A) None seen/Few  Urine Culture, Reflex  Result Value Ref Range  Urine Culture, Routine Final report (A)    Organism ID, Bacteria Comment (A)   UA/M w/rflx Culture, Routine  Result Value Ref Range   Specific Gravity, UA 1.010 1.005 - 1.030   pH, UA 5.5 5.0 - 7.5   Color, UA Yellow Yellow   Appearance Ur Clear Clear   Leukocytes, UA Trace (A) Negative   Protein, UA Negative Negative/Trace   Glucose, UA Negative Negative   Ketones, UA Negative Negative   RBC, UA Negative Negative   Bilirubin, UA Negative Negative   Urobilinogen, Ur 0.2 0.2 - 1.0 mg/dL   Nitrite, UA Negative Negative   Microscopic Examination See below:    Urinalysis Reflex Comment       Assessment & Plan:   Problem List Items Addressed This Visit      Respiratory   Lung cancer ([place]) - Primary    Following with oncology. Doing well. This does not inhibit her ability to drive.       Primary cancer of right lower lobe of lung [facility])    Following with oncology. Doing  well. This does not inhibit her ability to drive.         Other   Chronic anxiety    Following with psychiatry. Doing well. This does not inhibit her ability to drive.       Mild cognitive impairment    Very mild. This does not inhibit her ability to drive.           Follow up plan: Return if symptoms worsen or fail to improve.   . This visit was completed via FaceTime due to the restrictions of the COVID-19 pandemic. All issues as above were discussed and addressed. Physical exam was done as above through visual confirmation on FaceTime. If it was felt that the patient should be evaluated in the office, they were directed there. The patient verbally consented to this visit. . Location of the patient: home . Location of the provider: home . Those involved with this call:  . Provider: Park Liter, DO . CMA: Lesle Chris, [place] . Front Desk/Registration: Linard Millers  . Time spent on call: 15 minutes with patient face to face via video conference. More than 50% of this time was spent in counseling and coordination of care. 23 minutes total spent in review of patient's record and preparation of their chart.

## 2019-01-27 DIAGNOSIS — Z96659 Presence of unspecified artificial knee joint: Secondary | ICD-10-CM | POA: Diagnosis not present

## 2019-01-27 DIAGNOSIS — C3490 Malignant neoplasm of unspecified part of unspecified bronchus or lung: Secondary | ICD-10-CM | POA: Diagnosis not present

## 2019-01-27 NOTE — Assessment & Plan Note (Signed)
Very mild. This does not inhibit her ability to drive.

## 2019-01-27 NOTE — Assessment & Plan Note (Signed)
Following with psychiatry. Doing well. This does not inhibit her ability to drive.

## 2019-01-27 NOTE — Assessment & Plan Note (Signed)
Following with oncology. Doing well. This does not inhibit her ability to drive.

## 2019-01-31 ENCOUNTER — Other Ambulatory Visit: Payer: Self-pay | Admitting: Psychiatry

## 2019-01-31 DIAGNOSIS — J449 Chronic obstructive pulmonary disease, unspecified: Secondary | ICD-10-CM | POA: Diagnosis not present

## 2019-01-31 DIAGNOSIS — F331 Major depressive disorder, recurrent, moderate: Secondary | ICD-10-CM

## 2019-01-31 DIAGNOSIS — Z96659 Presence of unspecified artificial knee joint: Secondary | ICD-10-CM | POA: Diagnosis not present

## 2019-01-31 DIAGNOSIS — C3490 Malignant neoplasm of unspecified part of unspecified bronchus or lung: Secondary | ICD-10-CM | POA: Diagnosis not present

## 2019-02-01 DIAGNOSIS — J449 Chronic obstructive pulmonary disease, unspecified: Secondary | ICD-10-CM | POA: Diagnosis not present

## 2019-02-08 ENCOUNTER — Encounter: Payer: Self-pay | Admitting: Psychiatry

## 2019-02-08 ENCOUNTER — Other Ambulatory Visit: Payer: Self-pay

## 2019-02-08 ENCOUNTER — Ambulatory Visit (INDEPENDENT_AMBULATORY_CARE_PROVIDER_SITE_OTHER): Payer: Medicare Other | Admitting: Psychiatry

## 2019-02-08 DIAGNOSIS — G3184 Mild cognitive impairment, so stated: Secondary | ICD-10-CM

## 2019-02-08 DIAGNOSIS — F5101 Primary insomnia: Secondary | ICD-10-CM | POA: Diagnosis not present

## 2019-02-08 DIAGNOSIS — F411 Generalized anxiety disorder: Secondary | ICD-10-CM | POA: Diagnosis not present

## 2019-02-08 DIAGNOSIS — F331 Major depressive disorder, recurrent, moderate: Secondary | ICD-10-CM

## 2019-02-08 MED ORDER — FLUOXETINE HCL 40 MG PO CAPS: 40.0000 mg | ORAL_CAPSULE | Freq: Every day | ORAL | 0 refills | Status: DC

## 2019-02-08 MED ORDER — SUVOREXANT 20 MG PO TABS: 20.0000 mg | ORAL_TABLET | Freq: Every day | ORAL | 2 refills | Status: DC

## 2019-02-08 NOTE — Progress Notes (Signed)
Virtual Visit via Telephone Note  I connected with Susan Houston on 02/08/19 at  3:00 PM EDT by telephone and verified that I am speaking with the correct person using two identifiers.   I discussed the limitations, risks, security and privacy concerns of performing an evaluation and management service by telephone and the availability of in person appointments. I also discussed with the patient that there may be a patient responsible charge related to this service. The patient expressed understanding and agreed to proceed.      I discussed the assessment and treatment plan with the patient. The patient was provided an opportunity to ask questions and all were answered. The patient agreed with the plan and demonstrated an understanding of the instructions.   The patient was advised to call back or seek an in-person evaluation if the symptoms worsen or if the condition fails to improve as anticipated.   [place] MD OP Progress Note  02/08/2019 4:18 PM Susan Houston  MRN:  [id number]  Chief Complaint:  Chief Complaint    Follow-up     HPI: Susan Houston is a 58 year old Caucasian female, married, unemployed, lives in [place], has a history of depression, vitamin B12 deficiency, mild cognitive impairment, migraine, COPD, chronic pain, degenerative disc disease, tremors, hyperlipidemia, recent knee replacement, history of lung cancer, was evaluated by phone today.  Patient today reports that she has been struggling with some anxiety and depressive symptoms recently.  She reports she is stressed out about the COVID-19 outbreak.  She reports some sadness.  She reports some nervousness and restlessness on a regular basis.  She has been taking Prozac as prescribed.  Discussed readjusting her dosage and she agrees with plan.  She continues to have sleep problems.  She reports she had knee replacement done recently and that is also giving her some trouble.  She reports that could be part of the reason why she is  having sleep issues.  Belsomra does help to some extent however her sleep continues to be interrupted.  She is not interested in changing her Belsomra to another medication today.  Hence advised to add melatonin.  She has tried melatonin previously but does not remember the dosage.  Discussed with her to start melatonin 5 mg and take it 90 minutes prior to bedtime.  Discussed with her to start working on her sleep hygiene.  Patient denies any suicidality, homicidality or perceptual disturbances.  Patient continues to be alert oriented to person place and situation.  She does have good social support system from her husband.  Visit Diagnosis:    ICD-10-CM   1. MDD (major depressive disorder), recurrent episode, moderate (HCC) F33.1 FLUoxetine (PROZAC) 40 MG capsule  2. Primary insomnia F51.01 Suvorexant (BELSOMRA) 20 MG TABS  3. GAD (generalized anxiety disorder) F41.1   4. MCI (mild cognitive impairment) G31.84     Past Psychiatric History: Reviewed past psychiatric history from my progress note on 01/14/2018.  Past trials of Lexapro, trazodone  Past Medical History:  Past Medical History:  Diagnosis Date  . Anemia    vitamin b12 deficiency. no longer taking supplements  . Anxiety   . Arthritis   . Asthma   . Back pain   . Bruises easily   . Cancer ([facility]) 2009   LARYNX CANCER - CHEMO / RADIATION (NO SURGERY)   . Chronic leg pain    BILATERAL  . COPD (chronic obstructive pulmonary disease) (HCC)    no inhalers for over 1 year  .  Depression   . Difficulty sleeping   . Diverticulitis   . Dysrhythmia 04/2018   brady episodes. cleared by dr. Clayborn Bigness  . Facet syndrome, lumbar 03/28/2015  . Hemorrhoids   . History of kidney stones 04/2018   recently passed stone  . Hyperlipidemia   . Hypertension 2019   not on any treatment currently  . Incontinence of urine   . Laryngeal cancer ([facility]) 2009  . Lung cancer ([facility]) 04/2018   diagnosed via ct scan  . Lung cancer ([facility]) 04/18/2018  .  Migraine   . Migraine without aura and without status migrainosus, not intractable 09/11/2016  . Mild cognitive impairment 11/23/2017  . MRSA infection greater than 3 months ago    2008 right side of face  . Nerve damage    right leg. thinks this is from her TKR  . Nodule of lower lobe of right lung 04/22/2018  . Oxygen deficiency    2L/HS  . Wears dentures    full upper and lower    Past Surgical History:  Procedure Laterality Date  . ABDOMINAL HYSTERECTOMY  1983  . BLADDER SUSPENSION  2014  . CATARACT EXTRACTION W/PHACO Left 05/11/2018   Procedure: CATARACT EXTRACTION PHACO AND INTRAOCULAR LENS PLACEMENT ([facility]) LEFT;  Surgeon: Leandrew Koyanagi, MD;  Location: [facility];  Service: Ophthalmology;  Laterality: Left;  PREFERS EARLY  . CATARACT EXTRACTION W/PHACO Right 08/11/2018   Procedure: CATARACT EXTRACTION PHACO AND INTRAOCULAR LENS PLACEMENT ([facility]);  Surgeon: Leandrew Koyanagi, MD;  Location: [facility];  Service: Ophthalmology;  Laterality: Right;  CDE 5:02 Total Time 01:35 Fluid lot # [id number]  . CHOLECYSTECTOMY  1998  . COLONOSCOPY    . ESOPHAGOGASTRODUODENOSCOPY  2015  . ESOPHAGOGASTRODUODENOSCOPY (EGD) WITH PROPOFOL N/A 11/20/2016   Procedure: ESOPHAGOGASTRODUODENOSCOPY (EGD) WITH PROPOFOL;  Surgeon: Jonathon Bellows, MD;  Location: [facility];  Service: Endoscopy;  Laterality: N/A;  . EXCISION NEUROMA Bilateral 09/24/2014   Procedure: BILATERAL OPEN SAPHENOUS NEURECTOMIES AND OPEN LEFT PERIPATELLA OSETOPHYTECTOMY;  Surgeon: Mauri Pole, MD;  Location: [facility];  Service: Orthopedics;  Laterality: Bilateral;  . EYE SURGERY Left 05/11/2018   cataract extraction  . JOINT REPLACEMENT  2009/2010   BIL TOTAL KNEES  . THORACOTOMY/LOBECTOMY Right 05/16/2018   Procedure: THORACOTOMY/POSSIBLE LOBECTOMY;  Surgeon: Nestor Lewandowsky, MD;  Location: [facility];  Service: Thoracic;  Laterality: Right;  . TONSILLECTOMY    . TOTAL KNEE REVISION Left 11/29/2018   Procedure: Left TOTAL KNEE  REVISION;  Surgeon: Hessie Knows, MD;  Location: [facility];  Service: Orthopedics;  Laterality: Left;  Marland Kitchen VIDEO BRONCHOSCOPY N/A 05/16/2018   Procedure: PREOP  BRONCHOSCOPY;  Surgeon: Nestor Lewandowsky, MD;  Location: [facility];  Service: Thoracic;  Laterality: N/A;    Family Psychiatric History: Reviewed family psychiatric history from my progress note on 01/14/2018  Family History:  Family History  Problem Relation Age of Onset  . Asthma Mother   . Diabetes Mother   . Hyperlipidemia Mother   . Hypertension Mother   . Cirrhosis Mother        Non-alcoholic  . Arthritis Father   . Cancer Father        Bone  . Hyperlipidemia Father   . Hypertension Father   . Anxiety disorder Sister   . Depression Sister   . Kidney Stones Son   . Stroke Maternal Grandmother   . Breast cancer Maternal Grandmother 52  . Cancer Maternal Grandfather        lung  . Pneumonia Paternal Grandmother   .  Alzheimer's disease Paternal Grandfather   . Diabetes Sister   . Gout Sister   . Hypertension Sister   . Alcohol abuse Brother   . Heart disease Brother        massive MI    Social History: Reviewed social history from my progress note on 01/14/2018. Social History   Socioeconomic History  . Marital status: Married    Spouse name: Tharon Aquas  . Number of children: 2  . Years of education: 41  . Highest education level: 12th grade  Occupational History  . Occupation: Disabled  Social Needs  . Financial resource strain: Not hard at all  . Food insecurity:    Worry: Never true    Inability: Never true  . Transportation needs:    Medical: No    Non-medical: No  Tobacco Use  . Smoking status: Former Smoker    Packs/day: 0.50    Types: Cigarettes    Last attempt to quit: 04/07/2016    Years since quitting: 2.8  . Smokeless tobacco: Never Used  . Tobacco comment: patient has not smoked x 10 days.06/28/17 chantix  Substance and Sexual Activity  . Alcohol use: No    Alcohol/week: 0.0 standard drinks   . Drug use: No  . Sexual activity: Yes    Partners: Male    Birth control/protection: None  Lifestyle  . Physical activity:    Days per week: 0 days    Minutes per session: 0 min  . Stress: Not at all  Relationships  . Social connections:    Talks on phone: Never    Gets together: Twice a week    Attends religious service: More than 4 times per year    Active member of club or organization: No    Attends meetings of clubs or organizations: Never    Relationship status: Married  Other Topics Concern  . Not on file  Social History Narrative   Lives at home w/ her husband   Right-handed   Caffeine: occasionally    Allergies:  Allergies  Allergen Reactions  . Morphine And Related Itching    Can tolerate with benadryl  . Cymbalta [Duloxetine Hcl] Other (See Comments)    Pt states it keeps her awake  . Adhesive [Tape] Other (See Comments)    Plastic tape rips skin and bruises her. PLEASE USE PAPER TAPE  . Lexapro [Escitalopram Oxalate] Other (See Comments)    Keeps awake for days.     Metabolic Disorder Labs: No results found for: HGBA1C, MPG No results found for: PROLACTIN Lab Results  Component Value Date   CHOL 169 12/03/2017   TRIG 126 12/03/2017   HDL 45 12/03/2017   LDLCALC 99 12/03/2017   LDLCALC 87 07/10/2016   Lab Results  Component Value Date   TSH 3.676 09/11/2018   TSH 3.16 05/21/2014    Therapeutic Level Labs: No results found for: LITHIUM No results found for: VALPROATE No components found for:  CBMZ  Current Medications: Current Outpatient Medications  Medication Sig Dispense Refill  . albuterol (PROVENTIL HFA;VENTOLIN HFA) 108 (90 Base) MCG/ACT inhaler Inhale 2 puffs into the lungs every 4 (four) hours as needed for wheezing or shortness of breath. 1 Inhaler 2  . albuterol (PROVENTIL) (2.5 MG/3ML) 0.083% nebulizer solution Take 3 mLs (2.5 mg total) by nebulization every 4 (four) hours as needed for wheezing or shortness of breath. 75 mL 12   . estradiol (ESTRACE) 1 MG tablet     . FLUoxetine (PROZAC) 40  MG capsule Take 1 capsule (40 mg total) by mouth daily. 90 capsule 0  . guaiFENesin-codeine 100-10 MG/5ML syrup Take 10 mLs by mouth 3 (three) times daily as needed for cough. (Patient not taking: Reported on 11/24/2018) 120 mL 0  . HYDROcodone-acetaminophen (NORCO) 7.5-325 MG tablet Take 1-2 tablets by mouth every 4 (four) hours as needed for moderate pain. 60 tablet 0  . hydrOXYzine (VISTARIL) 25 MG capsule Take 1 capsule (25 mg total) by mouth 2 (two) times daily as needed. For severe anxiety symptoms 60 capsule 1  . omeprazole (PRILOSEC) 40 MG capsule Take 1 capsule (40 mg total) by mouth daily. 90 capsule 1  . OXYGEN Inhale 2 L into the lungs at bedtime.    . pregabalin (LYRICA) 25 MG capsule Take 1 capsule (25 mg total) by mouth 3 (three) times daily for 30 days. 90 capsule 0  . Suvorexant (BELSOMRA) 20 MG TABS Take 20 mg by mouth at bedtime. 30 tablet 2  . traMADol (ULTRAM) 50 MG tablet Take 1 tablet (50 mg total) by mouth every 6 (six) hours. 40 tablet 0   No current facility-administered medications for this visit.    Facility-Administered Medications Ordered in Other Visits  Medication Dose Route Frequency Provider Last Rate Last Dose  . ipratropium-albuterol (DUONEB) 0.5-2.5 (3) MG/3ML nebulizer solution 3 mL  3 mL Nebulization Q6H Burns, Wandra Feinstein, NP         Musculoskeletal: Strength & Muscle Tone: UTA Gait & Station: UTA Patient leans: N/A  Psychiatric Specialty Exam: Review of Systems  Psychiatric/Behavioral: The patient is nervous/anxious and has insomnia.   All other systems reviewed and are negative.   There were no vitals taken for this visit.There is no height or weight on file to calculate BMI.  General Appearance: UTA  Eye Contact:  UTA  Speech:  Normal Rate  Volume:  Normal  Mood:  Anxious  Affect:  UTA  Thought Process:  Goal Directed and Descriptions of Associations: Intact  Orientation:   Full (Time, Place, and Person)  Thought Content: Logical   Suicidal Thoughts:  No  Homicidal Thoughts:  No  Memory:  Immediate;   Fair Recent;   Fair Remote;   Fair  Judgement:  Fair  Insight:  Fair  Psychomotor Activity:  UTA  Concentration:  Concentration: Fair and Attention Span: Fair  Recall:  [employer] of Knowledge: Fair  Language: Fair  Akathisia:  No  Handed:  Right  AIMS (if indicated): UTA  Assets:  Communication Skills Desire for Improvement Social Support  ADL's:  Intact  Cognition: WNL  Sleep:  Poor   Screenings: GAD-7     Office Visit from 10/25/2018 in [facility] Visit from 09/16/2018 in [facility] Office Visit from 07/13/2018 in [facility] Office Visit from 06/28/2018 in [facility] Office Visit from 04/15/2018 in [facility]  Total GAD-7 Score  9  9  19  6  8     PHQ2-9     Procedure visit from 11/15/2018 in [facility] Office Visit from 11/09/2018 in [facility] Procedure visit from 10/27/2018 in [facility] Office Visit from 10/25/2018 in [facility] Procedure visit from 09/29/2018 in [facility]  PHQ-2 Total Score  0  2  1  1   0  PHQ-9 Total Score  -  8  -  7  5       Assessment and Plan: Markan is a 58 year old Caucasian female, history of depression, generalized anxiety disorder, history of delirium, tremors, migraine headaches, history of lung cancer, vitamin B12 deficiency, MCI, history of recent knee replacement, was evaluated by phone today.  Patient was initially referred to the clinic by neurology to get a second opinion on her psychiatric medication due to her most recent confusion memory loss and falls.  Patient continues to struggle with some mood and sleep problems.  We will continue to need medication  management.  She will also benefit from continued psychotherapy sessions.  Plan MDD- some progress Increase Prozac to 40 mg p.o. daily. Patient was referred for CBT  For generalized anxiety disorder- some improvement Prozac increased to 40 mg p.o. daily. Hydroxyzine 25 mg p.o. twice daily PRN.  She uses it very rarely.   For insomnia- restless Continue Belsomra 20 mg p.o. nightly Discussed to add melatonin 5 mg. Discussed sleep hygiene techniques.  For MCI-she will continue to work with neurology.  Follow-up in clinic in 4 weeks or sooner if needed.  I have spent atleast 15 minutes non face to face with patient today. More than 50 % of the time was spent for psychoeducation and supportive psychotherapy and care coordination.  This note was generated in part or whole with voice recognition software. Voice recognition is usually quite accurate but there are transcription errors that can and very often do occur. I apologize for any typographical errors that were not detected and corrected.           Ursula Alert, MD 02/08/2019, 4:18 PM

## 2019-02-13 ENCOUNTER — Other Ambulatory Visit: Payer: Self-pay | Admitting: Psychiatry

## 2019-02-13 DIAGNOSIS — F330 Major depressive disorder, recurrent, mild: Secondary | ICD-10-CM

## 2019-02-16 ENCOUNTER — Telehealth: Payer: Self-pay | Admitting: *Deleted

## 2019-02-16 NOTE — Telephone Encounter (Signed)
Pt called in concerned about new left mid back pain. She is wondering if needs to be seen. Please advise.

## 2019-02-17 ENCOUNTER — Other Ambulatory Visit: Payer: Self-pay | Admitting: Family Medicine

## 2019-02-17 ENCOUNTER — Inpatient Hospital Stay: Payer: Medicare Other | Attending: Internal Medicine | Admitting: Internal Medicine

## 2019-02-17 ENCOUNTER — Encounter: Payer: Self-pay | Admitting: Internal Medicine

## 2019-02-17 ENCOUNTER — Other Ambulatory Visit: Payer: Self-pay

## 2019-02-17 VITALS — BP 131/84 | HR 68 | Temp 99.1°F | Resp 18 | Ht 62.0 in | Wt 190.4 lb

## 2019-02-17 DIAGNOSIS — C7951 Secondary malignant neoplasm of bone: Secondary | ICD-10-CM | POA: Diagnosis not present

## 2019-02-17 DIAGNOSIS — G479 Sleep disorder, unspecified: Secondary | ICD-10-CM | POA: Insufficient documentation

## 2019-02-17 DIAGNOSIS — C3431 Malignant neoplasm of lower lobe, right bronchus or lung: Secondary | ICD-10-CM | POA: Diagnosis not present

## 2019-02-17 DIAGNOSIS — Z8249 Family history of ischemic heart disease and other diseases of the circulatory system: Secondary | ICD-10-CM | POA: Insufficient documentation

## 2019-02-17 DIAGNOSIS — Z803 Family history of malignant neoplasm of breast: Secondary | ICD-10-CM | POA: Insufficient documentation

## 2019-02-17 DIAGNOSIS — R5383 Other fatigue: Secondary | ICD-10-CM | POA: Insufficient documentation

## 2019-02-17 DIAGNOSIS — I10 Essential (primary) hypertension: Secondary | ICD-10-CM | POA: Diagnosis not present

## 2019-02-17 DIAGNOSIS — J449 Chronic obstructive pulmonary disease, unspecified: Secondary | ICD-10-CM | POA: Diagnosis not present

## 2019-02-17 DIAGNOSIS — Z87891 Personal history of nicotine dependence: Secondary | ICD-10-CM | POA: Diagnosis not present

## 2019-02-17 DIAGNOSIS — K5900 Constipation, unspecified: Secondary | ICD-10-CM

## 2019-02-17 DIAGNOSIS — R319 Hematuria, unspecified: Secondary | ICD-10-CM | POA: Insufficient documentation

## 2019-02-17 DIAGNOSIS — Z8521 Personal history of malignant neoplasm of larynx: Secondary | ICD-10-CM | POA: Diagnosis not present

## 2019-02-17 DIAGNOSIS — Z7951 Long term (current) use of inhaled steroids: Secondary | ICD-10-CM | POA: Diagnosis not present

## 2019-02-17 DIAGNOSIS — M545 Low back pain: Secondary | ICD-10-CM | POA: Insufficient documentation

## 2019-02-17 DIAGNOSIS — Z1231 Encounter for screening mammogram for malignant neoplasm of breast: Secondary | ICD-10-CM

## 2019-02-17 DIAGNOSIS — E785 Hyperlipidemia, unspecified: Secondary | ICD-10-CM | POA: Diagnosis not present

## 2019-02-17 DIAGNOSIS — F419 Anxiety disorder, unspecified: Secondary | ICD-10-CM | POA: Diagnosis not present

## 2019-02-17 DIAGNOSIS — R0789 Other chest pain: Secondary | ICD-10-CM | POA: Diagnosis not present

## 2019-02-17 DIAGNOSIS — K219 Gastro-esophageal reflux disease without esophagitis: Secondary | ICD-10-CM | POA: Diagnosis not present

## 2019-02-17 DIAGNOSIS — Z79899 Other long term (current) drug therapy: Secondary | ICD-10-CM | POA: Diagnosis not present

## 2019-02-17 DIAGNOSIS — R079 Chest pain, unspecified: Secondary | ICD-10-CM

## 2019-02-17 MED ORDER — TRAMADOL HCL 50 MG PO TABS: 50.0000 mg | ORAL_TABLET | Freq: Three times a day (TID) | ORAL | 0 refills | Status: DC | PRN

## 2019-02-17 NOTE — Assessment & Plan Note (Addendum)
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  Stage I ; CT scans January 2020 No evidence of recurrence.  Clinically stable-however patient having worsening posterior left chest wall pain [see discussion below]  #Left posterior chest wall pain pleuritic in nature-question acute PE.  Recommend a CTA chest ASAP.  #Stage II laryngeal cancer-clinically stable.  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  S/p nerve ablation with pain physician.  Clinically stable.  # insominia/anxiety- on trazadone/ seroqul; klonipin-stable defer to PCP  # DISPOSITION:  # CT chest ASAP # follow up TBD based on CT results-

## 2019-02-17 NOTE — Telephone Encounter (Signed)
Per Dr. Jacinto Reap, schedule pt to be seen on 5/1 at 11:30am. Left message with patient regarding appt as well as visitor restrictions. Advised of masking of patients while in clinic. Instructed to bring mask if available.

## 2019-02-17 NOTE — Progress Notes (Signed)
Patient here for evaluation for new left mid back pain. She last took tylenol 500 (2 tabs) at 9 30 am today.

## 2019-02-17 NOTE — Progress Notes (Signed)
+[place] OFFICE PROGRESS NOTE  Patient Care Team: Valerie Roys, DO as PCP - General (Family Medicine) Anabel Bene, MD as Referring Physician (Neurology) Telford Nab, RN as Registered Nurse  Cancer Staging No matching staging information was found for the patient.   Oncology History   # 2011-laryngeal cancer/SCC; Stage II; April-June 2011 s/p  CRT [Dr.Choski/Bennett]  # 2019- likely STAGE I 10 mm RLL nodule [incidental]; s/p wedge resection-July 2019-stage I squamous cell lung cancer.  Adjuvant therapy.  # COPD  # post thoracotomy syndrome  DIAGNOSIS: RLL lung ca  STAGE:  I       ;GOALS: cure  CURRENT/MOST RECENT THERAPY: surveillaince       Laryngeal cancer (HCC)    Primary cancer of right lower lobe of lung ([place])   05/27/2018 Initial Diagnosis    Primary cancer of right lower lobe of lung ([place])     INTERVAL HISTORY:  Susan Houston 58 y.o.  female pleasant patient with a history of stage I squamous cell lung cancer; and history of laryngeal cancer is here for follow-up.  Patient complains of worsening pain in the left posterior chest in the last 1 week.  8-9 on a scale of 10.  No radiation.  Pleuritic.  No relieving factors.  Also complains of worsening shortness of breath.  Also worsening cough.  No fever + chills.  Denies any leg swelling.   Review of Systems  Constitutional: Positive for malaise/fatigue. Negative for chills, diaphoresis, fever and weight loss.  HENT: Negative for nosebleeds and sore throat.   Eyes: Negative for double vision.  Respiratory: Positive for cough and shortness of breath. Negative for hemoptysis, sputum production and wheezing.   Cardiovascular: Positive for chest pain. Negative for palpitations, orthopnea and leg swelling.  Gastrointestinal: Positive for constipation and diarrhea. Negative for abdominal pain, blood in stool, heartburn, melena, nausea and vomiting.  Genitourinary: Negative for dysuria,  frequency and urgency.  Musculoskeletal: Negative for back pain and joint pain.  Skin: Negative.  Negative for itching and rash.  Neurological: Negative for dizziness, tingling, focal weakness, weakness and headaches.  Endo/Heme/Allergies: Does not bruise/bleed easily.  Psychiatric/Behavioral: Negative for depression. The patient is nervous/anxious and has insomnia.       PAST MEDICAL HISTORY :  Past Medical History:  Diagnosis Date  . Anemia    vitamin b12 deficiency. no longer taking supplements  . Anxiety   . Arthritis   . Asthma   . Back pain   . Bruises easily   . Cancer ([place]) 2009   [facility] CANCER - CHEMO / RADIATION (NO SURGERY)   . Chronic leg pain    BILATERAL  . COPD (chronic obstructive pulmonary disease) (HCC)    no inhalers for over 1 year  . Depression   . Difficulty sleeping   . Diverticulitis   . Dysrhythmia 04/2018   brady episodes. cleared by dr. Clayborn Bigness  . Facet syndrome, lumbar 03/28/2015  . Hemorrhoids   . History of kidney stones 04/2018   recently passed stone  . Hyperlipidemia   . Hypertension 2019   not on any treatment currently  . Incontinence of urine   . Laryngeal cancer ([place]) 2009  . Lung cancer ([place]) 04/2018   diagnosed via ct scan  . Lung cancer ([place]) 04/18/2018  . Migraine   . Migraine without aura and without status migrainosus, not intractable 09/11/2016  . Mild cognitive impairment 11/23/2017  . MRSA infection greater than 3 months ago  2008 right side of face  . Nerve damage    right leg. thinks this is from her TKR  . Nodule of lower lobe of right lung 04/22/2018  . Oxygen deficiency    2L/HS  . Wears dentures    full upper and lower    PAST SURGICAL HISTORY :   Past Surgical History:  Procedure Laterality Date  . ABDOMINAL HYSTERECTOMY  1983  . BLADDER SUSPENSION  2014  . CATARACT EXTRACTION W/PHACO Left 05/11/2018   Procedure: CATARACT EXTRACTION PHACO AND INTRAOCULAR LENS PLACEMENT ([place]) LEFT;  Surgeon: Leandrew Koyanagi, MD;  Location: [facility];  Service: Ophthalmology;  Laterality: Left;  PREFERS EARLY  . CATARACT EXTRACTION W/PHACO Right 08/11/2018   Procedure: CATARACT EXTRACTION PHACO AND INTRAOCULAR LENS PLACEMENT ([facility]);  Surgeon: Leandrew Koyanagi, MD;  Location: [facility];  Service: Ophthalmology;  Laterality: Right;  CDE 5:02 Total Time 01:35 Fluid lot # [id number]  . CHOLECYSTECTOMY  1998  . COLONOSCOPY    . ESOPHAGOGASTRODUODENOSCOPY  2015  . ESOPHAGOGASTRODUODENOSCOPY (EGD) WITH PROPOFOL N/A 11/20/2016   Procedure: ESOPHAGOGASTRODUODENOSCOPY (EGD) WITH PROPOFOL;  Surgeon: Jonathon Bellows, MD;  Location: [facility];  Service: Endoscopy;  Laterality: N/A;  . EXCISION NEUROMA Bilateral 09/24/2014   Procedure: BILATERAL OPEN SAPHENOUS NEURECTOMIES AND OPEN LEFT PERIPATELLA OSETOPHYTECTOMY;  Surgeon: Mauri Pole, MD;  Location: [facility];  Service: Orthopedics;  Laterality: Bilateral;  . EYE SURGERY Left 05/11/2018   cataract extraction  . JOINT REPLACEMENT  2009/2010   BIL TOTAL KNEES  . THORACOTOMY/LOBECTOMY Right 05/16/2018   Procedure: THORACOTOMY/POSSIBLE LOBECTOMY;  Surgeon: Nestor Lewandowsky, MD;  Location: [facility];  Service: Thoracic;  Laterality: Right;  . TONSILLECTOMY    . TOTAL KNEE REVISION Left 11/29/2018   Procedure: Left TOTAL KNEE REVISION;  Surgeon: Hessie Knows, MD;  Location: [facility];  Service: Orthopedics;  Laterality: Left;  Marland Kitchen VIDEO BRONCHOSCOPY N/A 05/16/2018   Procedure: PREOP  BRONCHOSCOPY;  Surgeon: Nestor Lewandowsky, MD;  Location: [facility];  Service: Thoracic;  Laterality: N/A;    FAMILY HISTORY :   Family History  Problem Relation Age of Onset  . Asthma Mother   . Diabetes Mother   . Hyperlipidemia Mother   . Hypertension Mother   . Cirrhosis Mother        Non-alcoholic  . Arthritis Father   . Cancer Father        Bone  . Hyperlipidemia Father   . Hypertension Father   . Anxiety disorder Sister   . Depression Sister   . Kidney Stones Son   . Stroke  Maternal Grandmother   . Breast cancer Maternal Grandmother 69  . Cancer Maternal Grandfather        lung  . Pneumonia Paternal Grandmother   . Alzheimer's disease Paternal Grandfather   . Diabetes Sister   . Gout Sister   . Hypertension Sister   . Alcohol abuse Brother   . Heart disease Brother        massive MI    SOCIAL HISTORY:   Social History   Tobacco Use  . Smoking status: Former Smoker    Packs/day: 0.50    Types: Cigarettes    Last attempt to quit: 04/07/2016    Years since quitting: 2.8  . Smokeless tobacco: Never Used  . Tobacco comment: patient has not smoked x 10 days.06/28/17 chantix  Substance Use Topics  . Alcohol use: No    Alcohol/week: 0.0 standard drinks  . Drug use: No    ALLERGIES:  is allergic to morphine and related; cymbalta [duloxetine hcl]; adhesive [tape]; and lexapro [escitalopram oxalate].  MEDICATIONS:  Current Outpatient Medications  Medication Sig Dispense Refill  . albuterol (PROVENTIL HFA;VENTOLIN HFA) 108 (90 Base) MCG/ACT inhaler Inhale 2 puffs into the lungs every 4 (four) hours as needed for wheezing or shortness of breath. 1 Inhaler 2  . albuterol (PROVENTIL) (2.5 MG/3ML) 0.083% nebulizer solution Take 3 mLs (2.5 mg total) by nebulization every 4 (four) hours as needed for wheezing or shortness of breath. 75 mL 12  . estradiol (ESTRACE) 1 MG tablet Take 1 mg by mouth daily.     Marland Kitchen FLUoxetine (PROZAC) 40 MG capsule Take 1 capsule (40 mg total) by mouth daily. 90 capsule 0  . guaiFENesin-codeine 100-10 MG/5ML syrup Take 10 mLs by mouth 3 (three) times daily as needed for cough. 120 mL 0  . hydrOXYzine (VISTARIL) 25 MG capsule TAKE 1 CAPSULE(25 MG) BY MOUTH TWICE DAILY AS NEEDED FOR SEVERE ANXIETY OR SYMPTOMS 60 capsule 1  . omeprazole (PRILOSEC) 40 MG capsule Take 1 capsule (40 mg total) by mouth daily. 90 capsule 1  . OXYGEN Inhale 2 L into the lungs at bedtime.    . pregabalin (LYRICA) 25 MG capsule Take 1 capsule (25 mg total) by  mouth 3 (three) times daily for 30 days. 90 capsule 0  . Suvorexant (BELSOMRA) 20 MG TABS Take 20 mg by mouth at bedtime. 30 tablet 2  . traMADol (ULTRAM) 50 MG tablet Take 1 tablet (50 mg total) by mouth every 8 (eight) hours as needed. 60 tablet 0   No current facility-administered medications for this visit.    Facility-Administered Medications Ordered in Other Visits  Medication Dose Route Frequency Provider Last Rate Last Dose  . ipratropium-albuterol (DUONEB) 0.5-2.5 (3) MG/3ML nebulizer solution 3 mL  3 mL Nebulization Q6H Burns, Jennifer E, NP        PHYSICAL EXAMINATION: ECOG PERFORMANCE STATUS: 1 - Symptomatic but completely ambulatory  BP 131/84 (BP Location: Left Arm, Patient Position: Sitting, Cuff Size: Normal)   Pulse 68   Temp 99.1 F (37.3 C) (Tympanic)   Resp 18   Ht 5\' 2"  (1.575 m)   Wt 190 lb 6.4 oz (86.4 kg)   BMI 34.82 kg/m   Filed Weights   02/17/19 1133  Weight: 190 lb 6.4 oz (86.4 kg)    Physical Exam  Constitutional: She is oriented to person, place, and time and well-developed, well-nourished, and in no distress.  She is alone.  She is walking herself.  Not on oxygen.  HENT:  Head: Normocephalic and atraumatic.  Mouth/Throat: Oropharynx is clear and moist. No oropharyngeal exudate.  Eyes: Pupils are equal, round, and reactive to light.  Neck: Normal range of motion. Neck supple.  Cardiovascular: Normal rate and regular rhythm.  Pulmonary/Chest: No respiratory distress. She has no wheezes.  Right chest wall incision wound healing.  Left posterior chest wall no rash.  Positive for tenderness.  Abdominal: Soft. Bowel sounds are normal. She exhibits no distension and no mass. There is no abdominal tenderness. There is no rebound and no guarding.  Musculoskeletal: Normal range of motion.        General: No tenderness or edema.  Neurological: She is alert and oriented to person, place, and time.  Skin: Skin is warm.  Psychiatric: Affect normal.        LABORATORY DATA:  I have reviewed the data as listed    Component Value Date/Time   NA 139 12/02/2018  0432   NA 137 12/03/2017 1447   NA 137 05/14/2014 1457   K 4.2 12/02/2018 0432   K 3.2 (L) 05/14/2014 1457   CL 104 12/02/2018 0432   CL 103 05/14/2014 1457   CO2 32 12/02/2018 0432   CO2 28 05/14/2014 1457   GLUCOSE 99 12/02/2018 0432   GLUCOSE 80 05/14/2014 1457   BUN 11 12/02/2018 0432   BUN 8 12/03/2017 1447   BUN 8 05/14/2014 1457   CREATININE 0.57 12/02/2018 0432   CREATININE 0.79 05/14/2014 1457   CALCIUM 8.3 (L) 12/02/2018 0432   CALCIUM 8.9 05/14/2014 1457   PROT 7.1 11/07/2018 1328   PROT 6.8 12/03/2017 1447   PROT 7.0 05/14/2014 1457   ALBUMIN 3.6 11/07/2018 1328   ALBUMIN 3.8 12/03/2017 1447   ALBUMIN 3.3 (L) 05/14/2014 1457   AST 15 11/07/2018 1328   AST 9 (L) 05/14/2014 1457   ALT 13 11/07/2018 1328   ALT 15 05/14/2014 1457   ALKPHOS 91 11/07/2018 1328   ALKPHOS 87 05/14/2014 1457   BILITOT 0.3 11/07/2018 1328   BILITOT 0.6 12/03/2017 1447   BILITOT 0.2 05/14/2014 1457   GFRNONAA >60 12/02/2018 0432   GFRNONAA >60 05/14/2014 1457   GFRAA >60 12/02/2018 0432   GFRAA >60 05/14/2014 1457    No results found for: SPEP, UPEP  Lab Results  Component Value Date   WBC 5.4 12/02/2018   NEUTROABS 6.6 11/07/2018   HGB 8.5 (L) 12/02/2018   HCT 28.1 (L) 12/02/2018   MCV 90.9 12/02/2018   PLT 169 12/02/2018      Chemistry      Component Value Date/Time   NA 139 12/02/2018 0432   NA 137 12/03/2017 1447   NA 137 05/14/2014 1457   K 4.2 12/02/2018 0432   K 3.2 (L) 05/14/2014 1457   CL 104 12/02/2018 0432   CL 103 05/14/2014 1457   CO2 32 12/02/2018 0432   CO2 28 05/14/2014 1457   BUN 11 12/02/2018 0432   BUN 8 12/03/2017 1447   BUN 8 05/14/2014 1457   CREATININE 0.57 12/02/2018 0432   CREATININE 0.79 05/14/2014 1457      Component Value Date/Time   CALCIUM 8.3 (L) 12/02/2018 0432   CALCIUM 8.9 05/14/2014 1457   ALKPHOS 91 11/07/2018  1328   ALKPHOS 87 05/14/2014 1457   AST 15 11/07/2018 1328   AST 9 (L) 05/14/2014 1457   ALT 13 11/07/2018 1328   ALT 15 05/14/2014 1457   BILITOT 0.3 11/07/2018 1328   BILITOT 0.6 12/03/2017 1447   BILITOT 0.2 05/14/2014 1457       RADIOGRAPHIC STUDIES: I have personally reviewed the radiological images as listed and agreed with the findings in the report. No results found.   ASSESSMENT & PLAN:  Primary cancer of right lower lobe of lung (HCC) #Squamous cell carcinoma of the right lower lobe-status post wedge resection.  Stage I ; CT scans January 2020 No evidence of recurrence.  Clinically stable-however patient having worsening posterior left chest wall pain [see discussion below]  #Left posterior chest wall pain pleuritic in nature-question acute PE.  Recommend a CTA chest ASAP.  #Stage II laryngeal cancer-clinically stable.  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  S/p nerve ablation with pain physician.  Clinically stable.  # insominia/anxiety- on trazadone/ seroqul; klonipin-stable defer to PCP  # DISPOSITION:  # CT chest ASAP # follow up TBD based on CT results-    Orders Placed This Encounter  Procedures  .  CT ANGIO CHEST PE W OR WO CONTRAST    Standing Status:   Future    Standing Expiration Date:   05/19/2020    Order Specific Question:   ** REASON FOR EXAM (FREE TEXT)    Answer:   lung cancer; pleuritic chest pain- ? acute PE    Order Specific Question:   If indicated for the ordered procedure, I authorize the administration of contrast media per Radiology protocol    Answer:   Yes    Order Specific Question:   Is patient pregnant?    Answer:   No    Order Specific Question:   Preferred imaging location?    Answer:   [facility]    Order Specific Question:   Radiology Contrast Protocol - do NOT remove file path    Answer:   [email or web address]   All questions were answered. The patient knows to call the clinic  with any problems, questions or concerns.      Cammie Sickle, MD 02/17/2019 1:01 PM

## 2019-02-21 ENCOUNTER — Telehealth: Payer: Self-pay | Admitting: Internal Medicine

## 2019-02-21 ENCOUNTER — Other Ambulatory Visit: Payer: Self-pay

## 2019-02-21 ENCOUNTER — Ambulatory Visit
Admission: RE | Admit: 2019-02-21 | Discharge: 2019-02-21 | Disposition: A | Discharge status: Home / Self Care | Payer: Medicare Other | Source: Ambulatory Visit | Attending: Internal Medicine | Admitting: Internal Medicine

## 2019-02-21 ENCOUNTER — Telehealth: Payer: Self-pay | Admitting: *Deleted

## 2019-02-21 DIAGNOSIS — R079 Chest pain, unspecified: Secondary | ICD-10-CM

## 2019-02-21 DIAGNOSIS — R0789 Other chest pain: Secondary | ICD-10-CM | POA: Diagnosis not present

## 2019-02-21 DIAGNOSIS — C3431 Malignant neoplasm of lower lobe, right bronchus or lung: Secondary | ICD-10-CM

## 2019-02-21 HISTORY — DX: Malignant neoplasm of lower lobe, right bronchus or lung: C34.31

## 2019-02-21 LAB — POCT I-STAT CREATININE: Creatinine, Ser: 0.7 mg/dL (ref 0.44–1.00)

## 2019-02-21 MED ORDER — IOHEXOL 350 MG/ML SOLN
75.0000 mL | Freq: Once | INTRAVENOUS | Status: AC | PRN
  Administered 2019-02-21: 75 mL via INTRAVENOUS

## 2019-02-21 NOTE — Addendum Note (Signed)
Addended by: Telford Nab on: 02/21/2019 04:03 PM   Modules accepted: Orders

## 2019-02-21 NOTE — Telephone Encounter (Signed)
Message sent to authorization team to get PET scan authorized ASAP. Susan Houston will schedule pt for appts and notify pt's husband with appt details. Order placed to add to tumor board for 5/14.

## 2019-02-21 NOTE — Telephone Encounter (Signed)
Pt is calling to get results from CT scan she had done this morning. She would like someone to give her husband a call with the results.

## 2019-02-21 NOTE — Telephone Encounter (Signed)
I Spoke to husband regarding the results of the CT scan.  Recommend a PET scan ASAP/prior to next Thursday [14th]  #Please schedule a follow-up with me on May 15th in the clinic.  No labs.  Hayley-please follow to have the PET scan done before next Thursday; please also add to the tumor conference for next Thursday.

## 2019-02-22 ENCOUNTER — Telehealth: Payer: Self-pay | Admitting: Family Medicine

## 2019-02-22 NOTE — Telephone Encounter (Signed)
Copied from [place] [id number]. Topic: General - Other >> Feb 22, 2019  1:22 PM Lennox Solders wrote: Reason for CRM:pt is calling and does not have any more refills on nystatin ointment. Pt would like new rx nystatin cream instead of ointment. Pt has a rash between her legs. [facility] in [place]

## 2019-02-23 MED ORDER — NYSTATIN 100000 UNIT/GM EX CREA: 1.0000 "application " | TOPICAL_CREAM | Freq: Two times a day (BID) | CUTANEOUS | 0 refills | Status: AC

## 2019-02-23 NOTE — Telephone Encounter (Signed)
Pt calling to check status. Please advise  °

## 2019-02-25 ENCOUNTER — Other Ambulatory Visit: Payer: Self-pay | Admitting: Psychiatry

## 2019-02-25 DIAGNOSIS — F331 Major depressive disorder, recurrent, moderate: Secondary | ICD-10-CM

## 2019-02-26 DIAGNOSIS — C3490 Malignant neoplasm of unspecified part of unspecified bronchus or lung: Secondary | ICD-10-CM | POA: Diagnosis not present

## 2019-02-26 DIAGNOSIS — Z96659 Presence of unspecified artificial knee joint: Secondary | ICD-10-CM | POA: Diagnosis not present

## 2019-02-27 ENCOUNTER — Other Ambulatory Visit: Payer: Self-pay | Admitting: Pain Medicine

## 2019-02-27 DIAGNOSIS — G588 Other specified mononeuropathies: Secondary | ICD-10-CM

## 2019-02-27 DIAGNOSIS — M792 Neuralgia and neuritis, unspecified: Secondary | ICD-10-CM

## 2019-02-27 NOTE — Telephone Encounter (Signed)
Pt called stating she needs a refill of her lyrica

## 2019-02-28 ENCOUNTER — Other Ambulatory Visit: Payer: Self-pay

## 2019-02-28 ENCOUNTER — Encounter
Admission: RE | Admit: 2019-02-28 | Discharge: 2019-02-28 | Disposition: A | Discharge status: Home / Self Care | Payer: Medicare Other | Source: Ambulatory Visit | Attending: Internal Medicine | Admitting: Internal Medicine

## 2019-02-28 DIAGNOSIS — J45909 Unspecified asthma, uncomplicated: Secondary | ICD-10-CM | POA: Diagnosis not present

## 2019-02-28 DIAGNOSIS — Z79899 Other long term (current) drug therapy: Secondary | ICD-10-CM | POA: Diagnosis not present

## 2019-02-28 DIAGNOSIS — C3431 Malignant neoplasm of lower lobe, right bronchus or lung: Secondary | ICD-10-CM | POA: Diagnosis not present

## 2019-02-28 DIAGNOSIS — I10 Essential (primary) hypertension: Secondary | ICD-10-CM | POA: Diagnosis not present

## 2019-02-28 DIAGNOSIS — Z87891 Personal history of nicotine dependence: Secondary | ICD-10-CM | POA: Insufficient documentation

## 2019-02-28 DIAGNOSIS — E785 Hyperlipidemia, unspecified: Secondary | ICD-10-CM | POA: Diagnosis not present

## 2019-02-28 LAB — GLUCOSE, CAPILLARY: Glucose-Capillary: 87 mg/dL (ref 70–99)

## 2019-02-28 MED ORDER — FLUDEOXYGLUCOSE F - 18 (FDG) INJECTION
10.4000 | Freq: Once | INTRAVENOUS | Status: AC | PRN
  Administered 2019-02-28: 10.4 via INTRAVENOUS

## 2019-03-02 ENCOUNTER — Other Ambulatory Visit: Payer: Self-pay

## 2019-03-02 ENCOUNTER — Telehealth: Payer: Self-pay | Admitting: *Deleted

## 2019-03-02 ENCOUNTER — Other Ambulatory Visit: Payer: Medicare Other

## 2019-03-02 NOTE — Telephone Encounter (Signed)
Pt called in to ask if could get stronger pain medication since the tramadol is not helping control her pain. Per Dr. Jacinto Reap, pt may take tylenol/motrin tonight until she can be evaluated in the clinic at her next appt on 5/15 at 1:45pm. Pt has been made aware of MD recommendations. Nothing further needed.

## 2019-03-02 NOTE — Progress Notes (Signed)
Tumor Board Documentation  Susan Houston was presented by Dr Rogue Bussing at our Tumor Board on 03/02/2019, which included representatives from medical oncology, radiation oncology, radiology, pathology, surgical, surgical oncology, navigation, internal medicine, genetics, palliative care, research.  Susan Houston currently presents as a current patient, for discussion, for new tumor(s) with history of the following treatments: active survellience, surgical intervention(s).  Additionally, we reviewed previous medical and familial history, history of present illness, and recent lab results along with all available histopathologic and imaging studies. The tumor board considered available treatment options and made the following recommendations: Biopsy Refer to Pulmanology for Lymph node biopsy, Possibe Radiation therapy  The following procedures/referrals were also placed: No orders of the defined types were placed in this encounter.   Clinical Trial Status: not discussed   Staging used: AJCC Stage Group  AJCC Staging:       Group: Stage 2 Laryngeal cancer   National site-specific guidelines NCCN were discussed with respect to the case.  Tumor board is a meeting of clinicians from various specialty areas who evaluate and discuss patients for whom a multidisciplinary approach is being considered. Final determinations in the plan of care are those of the provider(s). The responsibility for follow up of recommendations given during tumor board is that of the provider.   Today's extended care, comprehensive team conference, Susan Houston was not present for the discussion and was not examined.   Multidisciplinary Tumor Board is a multidisciplinary case peer review process.  Decisions discussed in the Multidisciplinary Tumor Board reflect the opinions of the specialists present at the conference without having examined the patient.  Ultimately, treatment and diagnostic decisions rest with the primary provider(s) and  the patient.

## 2019-03-03 ENCOUNTER — Other Ambulatory Visit: Payer: Self-pay

## 2019-03-03 ENCOUNTER — Other Ambulatory Visit: Payer: Self-pay | Admitting: *Deleted

## 2019-03-03 ENCOUNTER — Inpatient Hospital Stay (HOSPITAL_BASED_OUTPATIENT_CLINIC_OR_DEPARTMENT_OTHER): Payer: Medicare Other | Admitting: Internal Medicine

## 2019-03-03 VITALS — BP 117/84 | HR 62 | Temp 97.4°F | Ht 62.0 in | Wt 199.8 lb

## 2019-03-03 DIAGNOSIS — C3431 Malignant neoplasm of lower lobe, right bronchus or lung: Secondary | ICD-10-CM

## 2019-03-03 DIAGNOSIS — Z87891 Personal history of nicotine dependence: Secondary | ICD-10-CM

## 2019-03-03 DIAGNOSIS — R5383 Other fatigue: Secondary | ICD-10-CM

## 2019-03-03 DIAGNOSIS — Z8521 Personal history of malignant neoplasm of larynx: Secondary | ICD-10-CM | POA: Diagnosis not present

## 2019-03-03 DIAGNOSIS — J449 Chronic obstructive pulmonary disease, unspecified: Secondary | ICD-10-CM | POA: Diagnosis not present

## 2019-03-03 DIAGNOSIS — I10 Essential (primary) hypertension: Secondary | ICD-10-CM | POA: Diagnosis not present

## 2019-03-03 DIAGNOSIS — G479 Sleep disorder, unspecified: Secondary | ICD-10-CM

## 2019-03-03 DIAGNOSIS — Z79899 Other long term (current) drug therapy: Secondary | ICD-10-CM

## 2019-03-03 DIAGNOSIS — Z7951 Long term (current) use of inhaled steroids: Secondary | ICD-10-CM | POA: Diagnosis not present

## 2019-03-03 DIAGNOSIS — K5900 Constipation, unspecified: Secondary | ICD-10-CM

## 2019-03-03 DIAGNOSIS — K219 Gastro-esophageal reflux disease without esophagitis: Secondary | ICD-10-CM

## 2019-03-03 DIAGNOSIS — Z8249 Family history of ischemic heart disease and other diseases of the circulatory system: Secondary | ICD-10-CM | POA: Diagnosis not present

## 2019-03-03 DIAGNOSIS — Z803 Family history of malignant neoplasm of breast: Secondary | ICD-10-CM | POA: Diagnosis not present

## 2019-03-03 DIAGNOSIS — R319 Hematuria, unspecified: Secondary | ICD-10-CM | POA: Diagnosis not present

## 2019-03-03 DIAGNOSIS — E785 Hyperlipidemia, unspecified: Secondary | ICD-10-CM

## 2019-03-03 DIAGNOSIS — M545 Low back pain: Secondary | ICD-10-CM | POA: Diagnosis not present

## 2019-03-03 DIAGNOSIS — F419 Anxiety disorder, unspecified: Secondary | ICD-10-CM

## 2019-03-03 DIAGNOSIS — R0789 Other chest pain: Secondary | ICD-10-CM

## 2019-03-03 LAB — URINALYSIS, COMPLETE (UACMP) WITH MICROSCOPIC
Bacteria, UA: NONE SEEN
Bilirubin Urine: NEGATIVE
Glucose, UA: NEGATIVE mg/dL
Ketones, ur: NEGATIVE mg/dL
Nitrite: NEGATIVE
Protein, ur: NEGATIVE mg/dL
RBC / HPF: >50 RBC/hpf — ABNORMAL HIGH (ref 0–5)
Specific Gravity, Urine: 1.01 (ref 1.005–1.030)
pH: 7 (ref 5.0–8.0)

## 2019-03-03 MED ORDER — OXYCODONE-ACETAMINOPHEN 7.5-325 MG PO TABS: 1.0000 | ORAL_TABLET | Freq: Three times a day (TID) | ORAL | 0 refills | Status: DC | PRN

## 2019-03-03 NOTE — Progress Notes (Signed)
+[facility] OFFICE PROGRESS NOTE  Patient Care Team: Valerie Roys, DO as PCP - General (Family Medicine) Anabel Bene, MD as Referring Physician (Neurology) Telford Nab, RN as Registered Nurse  Cancer Staging No matching staging information was found for the patient.   Oncology History   # 2011-laryngeal cancer/SCC; Stage II; April-June 2011 s/p  CRT [Dr.Choski/Bennett]  # 2019- likely STAGE I 10 mm RLL nodule [incidental]; s/p wedge resection-July 2019-stage I squamous cell lung cancer. NO  Adjuvant therapy.  # COPD  # post thoracotomy syndrome  DIAGNOSIS: RLL lung ca  STAGE:  I ; GOALS: cure  CURRENT/MOST RECENT THERAPY: surveillaince       Laryngeal cancer (HCC)    Primary cancer of right lower lobe of lung ([place])   05/27/2018 Initial Diagnosis    Primary cancer of right lower lobe of lung ([place])     INTERVAL HISTORY:  Susan Houston 58 y.o.  female pleasant patient with a history of stage I squamous cell lung cancer; and history of laryngeal cancer is here for follow-up/PET scan that was ordered for abnormal CT scan.  Patient continues to complain of back pain 8 on a scale of 10.  Not helping with tramadol.  No aggravating or relieving factors.  Denies any lower extremity weakness.  Denies any stumbling or falling.  Denies headaches.   Patient also complains of blood in urine for the last 1 day.  Intermittent.  Patient had blood in urine approximately a year ago at the diagnosis of lung cancer.  Never had kidney stones.  Review of Systems  Constitutional: Positive for malaise/fatigue. Negative for chills, diaphoresis, fever and weight loss.  HENT: Negative for nosebleeds and sore throat.   Eyes: Negative for double vision.  Respiratory: Positive for cough and shortness of breath. Negative for hemoptysis, sputum production and wheezing.   Cardiovascular: Positive for chest pain. Negative for palpitations, orthopnea and leg swelling.   Gastrointestinal: Positive for constipation and diarrhea. Negative for abdominal pain, blood in stool, heartburn, melena, nausea and vomiting.  Genitourinary: Negative for dysuria, frequency and urgency.  Musculoskeletal: Negative for back pain and joint pain.  Skin: Negative.  Negative for itching and rash.  Neurological: Negative for dizziness, tingling, focal weakness, weakness and headaches.  Endo/Heme/Allergies: Does not bruise/bleed easily.  Psychiatric/Behavioral: Negative for depression. The patient is nervous/anxious and has insomnia.       PAST MEDICAL HISTORY :  Past Medical History:  Diagnosis Date  . Anemia    vitamin b12 deficiency. no longer taking supplements  . Anxiety   . Arthritis   . Asthma   . Back pain   . Bruises easily   . Chronic leg pain    BILATERAL  . COPD (chronic obstructive pulmonary disease) (HCC)    no inhalers for over 1 year  . Depression   . Difficulty sleeping   . Diverticulitis   . Dysrhythmia 04/2018   brady episodes. cleared by dr. Clayborn Bigness  . Facet syndrome, lumbar 03/28/2015  . Hemorrhoids   . History of kidney stones 04/2018   recently passed stone  . Hyperlipidemia   . Hypertension 2019   not on any treatment currently  . Incontinence of urine   . Laryngeal cancer ([place]) 2009   LARYNX CANCER - CHEMO / RADIATION (NO SURGERY)   . Migraine   . Migraine without aura and without status migrainosus, not intractable 09/11/2016  . Mild cognitive impairment 11/23/2017  . MRSA infection greater than 3  months ago    2008 right side of face  . Nerve damage    right leg. thinks this is from her TKR  . Nodule of lower lobe of right lung 04/22/2018  . Oxygen deficiency    2L/HS  . Primary cancer of right lower lobe of lung ([place]) 04/18/2018   Surgical resection of RLL  . Wears dentures    full upper and lower    PAST SURGICAL HISTORY :   Past Surgical History:  Procedure Laterality Date  . ABDOMINAL HYSTERECTOMY  1983  . BLADDER  SUSPENSION  2014  . CATARACT EXTRACTION W/PHACO Left 05/11/2018   Procedure: CATARACT EXTRACTION PHACO AND INTRAOCULAR LENS PLACEMENT ([place]) LEFT;  Surgeon: Leandrew Koyanagi, MD;  Location: [facility];  Service: Ophthalmology;  Laterality: Left;  PREFERS EARLY  . CATARACT EXTRACTION W/PHACO Right 08/11/2018   Procedure: CATARACT EXTRACTION PHACO AND INTRAOCULAR LENS PLACEMENT ([facility]);  Surgeon: Leandrew Koyanagi, MD;  Location: [facility];  Service: Ophthalmology;  Laterality: Right;  CDE 5:02 Total Time 01:35 Fluid lot # [id number]  . CHOLECYSTECTOMY  1998  . COLONOSCOPY    . ESOPHAGOGASTRODUODENOSCOPY  2015  . ESOPHAGOGASTRODUODENOSCOPY (EGD) WITH PROPOFOL N/A 11/20/2016   Procedure: ESOPHAGOGASTRODUODENOSCOPY (EGD) WITH PROPOFOL;  Surgeon: Jonathon Bellows, MD;  Location: [facility];  Service: Endoscopy;  Laterality: N/A;  . EXCISION NEUROMA Bilateral 09/24/2014   Procedure: BILATERAL OPEN SAPHENOUS NEURECTOMIES AND OPEN LEFT PERIPATELLA OSETOPHYTECTOMY;  Surgeon: Mauri Pole, MD;  Location: [facility];  Service: Orthopedics;  Laterality: Bilateral;  . EYE SURGERY Left 05/11/2018   cataract extraction  . JOINT REPLACEMENT  2009/2010   BIL TOTAL KNEES  . THORACOTOMY/LOBECTOMY Right 05/16/2018   Procedure: THORACOTOMY/POSSIBLE LOBECTOMY;  Surgeon: Nestor Lewandowsky, MD;  Location: [facility];  Service: Thoracic;  Laterality: Right;  . TONSILLECTOMY    . TOTAL KNEE REVISION Left 11/29/2018   Procedure: Left TOTAL KNEE REVISION;  Surgeon: Hessie Knows, MD;  Location: [facility];  Service: Orthopedics;  Laterality: Left;  Marland Kitchen VIDEO BRONCHOSCOPY N/A 05/16/2018   Procedure: PREOP  BRONCHOSCOPY;  Surgeon: Nestor Lewandowsky, MD;  Location: [facility];  Service: Thoracic;  Laterality: N/A;    FAMILY HISTORY :   Family History  Problem Relation Age of Onset  . Asthma Mother   . Diabetes Mother   . Hyperlipidemia Mother   . Hypertension Mother   . Cirrhosis Mother        Non-alcoholic  . Arthritis Father    . Cancer Father        Bone  . Hyperlipidemia Father   . Hypertension Father   . Anxiety disorder Sister   . Depression Sister   . Kidney Stones Son   . Stroke Maternal Grandmother   . Breast cancer Maternal Grandmother 75  . Cancer Maternal Grandfather        lung  . Pneumonia Paternal Grandmother   . Alzheimer's disease Paternal Grandfather   . Diabetes Sister   . Gout Sister   . Hypertension Sister   . Alcohol abuse Brother   . Heart disease Brother        massive MI    SOCIAL HISTORY:   Social History   Tobacco Use  . Smoking status: Former Smoker    Packs/day: 0.50    Types: Cigarettes    Last attempt to quit: 04/07/2016    Years since quitting: 2.9  . Smokeless tobacco: Never Used  . Tobacco comment: patient has not smoked x 10 days.06/28/17 chantix  Substance Use  Topics  . Alcohol use: No    Alcohol/week: 0.0 standard drinks  . Drug use: No    ALLERGIES:  is allergic to morphine and related; cymbalta [duloxetine hcl]; adhesive [tape]; and lexapro [escitalopram oxalate].  MEDICATIONS:  Current Outpatient Medications  Medication Sig Dispense Refill  . albuterol (PROVENTIL HFA;VENTOLIN HFA) 108 (90 Base) MCG/ACT inhaler Inhale 2 puffs into the lungs every 4 (four) hours as needed for wheezing or shortness of breath. 1 Inhaler 2  . albuterol (PROVENTIL) (2.5 MG/3ML) 0.083% nebulizer solution Take 3 mLs (2.5 mg total) by nebulization every 4 (four) hours as needed for wheezing or shortness of breath. 75 mL 12  . estradiol (ESTRACE) 1 MG tablet Take 1 mg by mouth daily.     Marland Kitchen FLUoxetine (PROZAC) 40 MG capsule Take 1 capsule (40 mg total) by mouth daily. 90 capsule 0  . guaiFENesin-codeine 100-10 MG/5ML syrup Take 10 mLs by mouth 3 (three) times daily as needed for cough. 120 mL 0  . hydrOXYzine (VISTARIL) 25 MG capsule TAKE 1 CAPSULE(25 MG) BY MOUTH TWICE DAILY AS NEEDED FOR SEVERE ANXIETY OR SYMPTOMS 60 capsule 1  . nystatin cream (MYCOSTATIN) Apply 1 application  topically 2 (two) times daily. 30 g 0  . omeprazole (PRILOSEC) 40 MG capsule Take 1 capsule (40 mg total) by mouth daily. 90 capsule 1  . OXYGEN Inhale 2 L into the lungs at bedtime.    . pregabalin (LYRICA) 25 MG capsule Take 1 capsule (25 mg total) by mouth 3 (three) times daily for 30 days. 90 capsule 0  . Suvorexant (BELSOMRA) 20 MG TABS Take 20 mg by mouth at bedtime. 30 tablet 2  . traMADol (ULTRAM) 50 MG tablet Take 1 tablet (50 mg total) by mouth every 8 (eight) hours as needed. 60 tablet 0  . oxyCODONE-acetaminophen (PERCOCET) 7.5-325 MG tablet Take 1 tablet by mouth every 8 (eight) hours as needed for severe pain. 60 tablet 0   No current facility-administered medications for this visit.    Facility-Administered Medications Ordered in Other Visits  Medication Dose Route Frequency Provider Last Rate Last Dose  . ipratropium-albuterol (DUONEB) 0.5-2.5 (3) MG/3ML nebulizer solution 3 mL  3 mL Nebulization Q6H Burns, Wandra Feinstein, NP        PHYSICAL EXAMINATION: ECOG PERFORMANCE STATUS: 1 - Symptomatic but completely ambulatory  BP 117/84 (BP Location: Right Arm, Patient Position: Sitting, Cuff Size: Normal)   Pulse 62   Temp (!) 97.4 F (36.3 C) (Tympanic)   Ht 5\' 2"  (1.575 m)   Wt 199 lb 12.8 oz (90.6 kg)   BMI 36.54 kg/m   Filed Weights   03/03/19 1413  Weight: 199 lb 12.8 oz (90.6 kg)    Physical Exam  Constitutional: She is oriented to person, place, and time and well-developed, well-nourished, and in no distress.  She is alone.  She is walking herself.  Not on oxygen.  HENT:  Head: Normocephalic and atraumatic.  Mouth/Throat: Oropharynx is clear and moist. No oropharyngeal exudate.  Eyes: Pupils are equal, round, and reactive to light.  Neck: Normal range of motion. Neck supple.  Cardiovascular: Normal rate and regular rhythm.  Pulmonary/Chest: No respiratory distress. She has no wheezes.  Right chest wall incision wound healing.  Left posterior chest wall no rash.   Positive for tenderness.  Abdominal: Soft. Bowel sounds are normal. She exhibits no distension and no mass. There is no abdominal tenderness. There is no rebound and no guarding.  Musculoskeletal: Normal range of motion.  General: No tenderness or edema.  Neurological: She is alert and oriented to person, place, and time.  Skin: Skin is warm.  Psychiatric: Affect normal.       LABORATORY DATA:  I have reviewed the data as listed    Component Value Date/Time   NA 139 12/02/2018 0432   NA 137 12/03/2017 1447   NA 137 05/14/2014 1457   K 4.2 12/02/2018 0432   K 3.2 (L) 05/14/2014 1457   CL 104 12/02/2018 0432   CL 103 05/14/2014 1457   CO2 32 12/02/2018 0432   CO2 28 05/14/2014 1457   GLUCOSE 99 12/02/2018 0432   GLUCOSE 80 05/14/2014 1457   BUN 11 12/02/2018 0432   BUN 8 12/03/2017 1447   BUN 8 05/14/2014 1457   CREATININE 0.70 02/21/2019 1008   CREATININE 0.79 05/14/2014 1457   CALCIUM 8.3 (L) 12/02/2018 0432   CALCIUM 8.9 05/14/2014 1457   PROT 7.1 11/07/2018 1328   PROT 6.8 12/03/2017 1447   PROT 7.0 05/14/2014 1457   ALBUMIN 3.6 11/07/2018 1328   ALBUMIN 3.8 12/03/2017 1447   ALBUMIN 3.3 (L) 05/14/2014 1457   AST 15 11/07/2018 1328   AST 9 (L) 05/14/2014 1457   ALT 13 11/07/2018 1328   ALT 15 05/14/2014 1457   ALKPHOS 91 11/07/2018 1328   ALKPHOS 87 05/14/2014 1457   BILITOT 0.3 11/07/2018 1328   BILITOT 0.6 12/03/2017 1447   BILITOT 0.2 05/14/2014 1457   GFRNONAA >60 12/02/2018 0432   GFRNONAA >60 05/14/2014 1457   GFRAA >60 12/02/2018 0432   GFRAA >60 05/14/2014 1457    No results found for: SPEP, UPEP  Lab Results  Component Value Date   WBC 5.4 12/02/2018   NEUTROABS 6.6 11/07/2018   HGB 8.5 (L) 12/02/2018   HCT 28.1 (L) 12/02/2018   MCV 90.9 12/02/2018   PLT 169 12/02/2018      Chemistry      Component Value Date/Time   NA 139 12/02/2018 0432   NA 137 12/03/2017 1447   NA 137 05/14/2014 1457   K 4.2 12/02/2018 0432   K 3.2 (L)  05/14/2014 1457   CL 104 12/02/2018 0432   CL 103 05/14/2014 1457   CO2 32 12/02/2018 0432   CO2 28 05/14/2014 1457   BUN 11 12/02/2018 0432   BUN 8 12/03/2017 1447   BUN 8 05/14/2014 1457   CREATININE 0.70 02/21/2019 1008   CREATININE 0.79 05/14/2014 1457      Component Value Date/Time   CALCIUM 8.3 (L) 12/02/2018 0432   CALCIUM 8.9 05/14/2014 1457   ALKPHOS 91 11/07/2018 1328   ALKPHOS 87 05/14/2014 1457   AST 15 11/07/2018 1328   AST 9 (L) 05/14/2014 1457   ALT 13 11/07/2018 1328   ALT 15 05/14/2014 1457   BILITOT 0.3 11/07/2018 1328   BILITOT 0.6 12/03/2017 1447   BILITOT 0.2 05/14/2014 1457       RADIOGRAPHIC STUDIES: I have personally reviewed the radiological images as listed and agreed with the findings in the report. No results found.   ASSESSMENT & PLAN:  Primary cancer of right lower lobe of lung (HCC) #Squamous cell carcinoma of the right lower lobe-status post wedge resection.  CT scan/PET scan concerning for recurrent disease in the right hilar region.  Recommend pulmonary evaluation/biopsy.  #Discussed if malignancy is confirmed-options include radiation versus systemic therapy.  Surgery unlikely an option; will review with Dr. Faith Rogue.  #Worsening back pain lower thoracic.  The etiology is  unclear.  Recommend MRI of the thoracic spine stat.  Recommend hydrocodone every 8 hours as needed.  #Blood in urine-unclear etiology.  Check a UA.  #Stage II laryngeal cancer-no local recurrence question recurrence in lung see discussion above.  # insominia/anxiety- on trazadone/ seroqul; klonipin-stable  #Reviewed the tumor conference; discussed with the patient's husband.  # DISPOSITION:  # MRI Thoracic spine STAT # UA today. # follow up in 3 weeks;-MD labs- cbc/cmp-Dr.B    Orders Placed This Encounter  Procedures  . MR Thoracic Spine W Wo Contrast    Standing Status:   Future    Standing Expiration Date:   03/02/2020    Order Specific Question:   ** REASON  FOR EXAM (FREE TEXT)    Answer:   back pain- acute- PET possible lung cancer recurrence    Order Specific Question:   GRA to provide read?    Answer:   Yes    Order Specific Question:   If indicated for the ordered procedure, I authorize the administration of contrast media per Radiology protocol    Answer:   Yes    Order Specific Question:   What is the patient's sedation requirement?    Answer:   No Sedation    Order Specific Question:   Use SRS Protocol?    Answer:   No    Order Specific Question:   Does the patient have a pacemaker or implanted devices?    Answer:   No    Order Specific Question:   Preferred imaging location?    Answer:   [facility] (table limit-350lbs)    Order Specific Question:   Radiology Contrast Protocol - do NOT remove file path    Answer:   [email or web address]  . Urinalysis, Complete w Microscopic    Standing Status:   Future    Number of Occurrences:   1    Standing Expiration Date:   03/02/2020   All questions were answered. The patient knows to call the clinic with any problems, questions or concerns.      Cammie Sickle, MD 03/03/2019 4:29 PM

## 2019-03-03 NOTE — Assessment & Plan Note (Addendum)
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  CT scan/PET scan concerning for recurrent disease in the right hilar region.  Recommend pulmonary evaluation/biopsy.  #Discussed if malignancy is confirmed-options include radiation versus systemic therapy.  Surgery unlikely an option; will review with Dr. Faith Rogue.  #Worsening back pain lower thoracic.  The etiology is unclear.  Recommend MRI of the thoracic spine stat.  Recommend hydrocodone every 8 hours as needed.  #Blood in urine-unclear etiology.  Check a UA.  #Stage II laryngeal cancer-no local recurrence question recurrence in lung see discussion above.  # insominia/anxiety- on trazadone/ seroqul; klonipin-stable  #Reviewed the tumor conference; discussed with the patient's husband.  # DISPOSITION:  # MRI Thoracic spine STAT # UA today. # follow up in 3 weeks;-MD labs- cbc/cmp-Dr.B

## 2019-03-05 ENCOUNTER — Other Ambulatory Visit: Payer: Self-pay | Admitting: Psychiatry

## 2019-03-05 DIAGNOSIS — F331 Major depressive disorder, recurrent, moderate: Secondary | ICD-10-CM

## 2019-03-06 ENCOUNTER — Ambulatory Visit
Admission: RE | Admit: 2019-03-06 | Discharge: 2019-03-06 | Disposition: A | Discharge status: Home / Self Care | Payer: Medicare Other | Source: Ambulatory Visit | Attending: Internal Medicine | Admitting: Internal Medicine

## 2019-03-06 ENCOUNTER — Other Ambulatory Visit: Payer: Self-pay

## 2019-03-06 DIAGNOSIS — M546 Pain in thoracic spine: Secondary | ICD-10-CM | POA: Diagnosis not present

## 2019-03-06 DIAGNOSIS — C3431 Malignant neoplasm of lower lobe, right bronchus or lung: Secondary | ICD-10-CM | POA: Insufficient documentation

## 2019-03-06 DIAGNOSIS — Z85118 Personal history of other malignant neoplasm of bronchus and lung: Secondary | ICD-10-CM | POA: Diagnosis not present

## 2019-03-06 MED ORDER — GADOBUTROL 1 MMOL/ML IV SOLN
9.0000 mL | Freq: Once | INTRAVENOUS | Status: AC | PRN
  Administered 2019-03-06: 16:00:00 9 mL via INTRAVENOUS

## 2019-03-07 ENCOUNTER — Telehealth: Payer: Self-pay

## 2019-03-07 ENCOUNTER — Ambulatory Visit: Payer: Medicare Other | Attending: Nurse Practitioner | Admitting: Nurse Practitioner

## 2019-03-07 DIAGNOSIS — M940 Chondrocostal junction syndrome [Tietze]: Secondary | ICD-10-CM | POA: Diagnosis not present

## 2019-03-07 DIAGNOSIS — L7682 Other postprocedural complications of skin and subcutaneous tissue: Secondary | ICD-10-CM | POA: Diagnosis not present

## 2019-03-07 DIAGNOSIS — G894 Chronic pain syndrome: Secondary | ICD-10-CM

## 2019-03-07 DIAGNOSIS — G8922 Chronic post-thoracotomy pain: Secondary | ICD-10-CM | POA: Diagnosis not present

## 2019-03-07 DIAGNOSIS — M797 Fibromyalgia: Secondary | ICD-10-CM

## 2019-03-07 DIAGNOSIS — G588 Other specified mononeuropathies: Secondary | ICD-10-CM

## 2019-03-07 DIAGNOSIS — R0782 Intercostal pain: Secondary | ICD-10-CM

## 2019-03-07 DIAGNOSIS — M792 Neuralgia and neuritis, unspecified: Secondary | ICD-10-CM

## 2019-03-07 MED ORDER — PREGABALIN 50 MG PO CAPS: 50.0000 mg | ORAL_CAPSULE | Freq: Three times a day (TID) | ORAL | 2 refills | Status: DC

## 2019-03-07 NOTE — Patient Instructions (Signed)
____________________________________________________________________________________________  Medication Rules  Purpose: To inform patients, and their family members, of our rules and regulations.  Applies to: All patients receiving prescriptions (written or electronic).  Pharmacy of record: Pharmacy where electronic prescriptions will be sent. If written prescriptions are taken to a different pharmacy, please inform the nursing staff. The pharmacy listed in the electronic medical record should be the one where you would like electronic prescriptions to be sent.  Electronic prescriptions: In compliance with the [place] Strengthen Opioid Misuse Prevention (STOP) Act of 2017 (Session Law 2017-74/H243), effective October 19, 2018, all controlled substances must be electronically prescribed. Calling prescriptions to the pharmacy will cease to exist.  Prescription refills: Only during scheduled appointments. Applies to all prescriptions.  NOTE: The following applies primarily to controlled substances (Opioid* Pain Medications).   Patient's responsibilities: 1. Pain Pills: Bring all pain pills to every appointment (except for procedure appointments). 2. Pill Bottles: Bring pills in original pharmacy bottle. Always bring the newest bottle. Bring bottle, even if empty. 3. Medication refills: You are responsible for knowing and keeping track of what medications you take and those you need refilled. The day before your appointment: write a list of all prescriptions that need to be refilled. The day of the appointment: give the list to the admitting nurse. Prescriptions will be written only during appointments. No prescriptions will be written on procedure days. If you forget a medication: it will not be "Called in", "Faxed", or "electronically sent". You will need to get another appointment to get these prescribed. No early refills. Do not call asking to have your prescription filled  early. 4. Prescription Accuracy: You are responsible for carefully inspecting your prescriptions before leaving our office. Have the discharge nurse carefully go over each prescription with you, before taking them home. Make sure that your name is accurately spelled, that your address is correct. Check the name and dose of your medication to make sure it is accurate. Check the number of pills, and the written instructions to make sure they are clear and accurate. Make sure that you are given enough medication to last until your next medication refill appointment. 5. Taking Medication: Take medication as prescribed. When it comes to controlled substances, taking less pills or less frequently than prescribed is permitted and encouraged. Never take more pills than instructed. Never take medication more frequently than prescribed.  6. Inform other Doctors: Always inform, all of your healthcare providers, of all the medications you take. 7. Pain Medication from other Providers: You are not allowed to accept any additional pain medication from any other Doctor or Healthcare provider. There are two exceptions to this rule. (see below) In the event that you require additional pain medication, you are responsible for notifying us, as stated below. 8. Medication Agreement: You are responsible for carefully reading and following our Medication Agreement. This must be signed before receiving any prescriptions from our practice. Safely store a copy of your signed Agreement. Violations to the Agreement will result in no further prescriptions. (Additional copies of our Medication Agreement are available upon request.) 9. Laws, Rules, & Regulations: All patients are expected to follow all Federal and State Laws, Statutes, Rules, & Regulations. Ignorance of the Laws does not constitute a valid excuse. The use of any illegal substances is prohibited. 10. Adopted CDC guidelines & recommendations: Target dosing levels will be  at or below 60 MME/day. Use of benzodiazepines** is not recommended.  Exceptions: There are only two exceptions to the rule of not   receiving pain medications from other Healthcare Providers. 1. Exception #1 (Emergencies): In the event of an emergency (i.e.: accident requiring emergency care), you are allowed to receive additional pain medication. However, you are responsible for: As soon as you are able, call our office [phone], at any time of the day or night, and leave a message stating your name, the date and nature of the emergency, and the name and dose of the medication prescribed. In the event that your call is answered by a member of our staff, make sure to document and save the date, time, and the name of the person that took your information.  2. Exception #2 (Planned Surgery): In the event that you are scheduled by another doctor or dentist to have any type of surgery or procedure, you are allowed (for a period no longer than 30 days), to receive additional pain medication, for the acute post-op pain. However, in this case, you are responsible for picking up a copy of our "Post-op Pain Management for Surgeons" handout, and giving it to your surgeon or dentist. This document is available at our office, and does not require an appointment to obtain it. Simply go to our office during business hours (Monday-Thursday from 8:00 AM to 4:00 PM) (Friday 8:00 AM to 12:00 Noon) or if you have a scheduled appointment with us, prior to your surgery, and ask for it by name. In addition, you will need to provide us with your name, name of your surgeon, type of surgery, and date of procedure or surgery.  *Opioid medications include: morphine, codeine, oxycodone, oxymorphone, hydrocodone, hydromorphone, meperidine, tramadol, tapentadol, buprenorphine, fentanyl, methadone. **Benzodiazepine medications include: diazepam (Valium), alprazolam (Xanax), clonazepam (Klonopine), lorazepam (Ativan), clorazepate  (Tranxene), chlordiazepoxide (Librium), estazolam (Prosom), oxazepam (Serax), temazepam (Restoril), triazolam (Halcion) (Last updated: 12/16/2017) ____________________________________________________________________________________________    

## 2019-03-07 NOTE — Telephone Encounter (Signed)
Bronchoscopy 03/20/19 History of squamous cell ca, mediastinal adenopathy  [facility]US CPT: 31652, [phone]

## 2019-03-07 NOTE — Progress Notes (Signed)
Pain Management Encounter Note - Virtual Visit via Telephone Telehealth (real-time audio visits between healthcare provider and patient).  Patient's Phone No. & Preferred Pharmacy:  [phone] (home); [phone] (mobile); (Preferred) [facility] [place], [place] [address] [place] [address] Phone: [phone] Fax: [phone]   Pre-screening note:  Our staff contacted Susan Houston and offered her an "in person", "face-to-face" appointment versus a telephone encounter. She indicated preferring the telephone encounter, at this time.  Reason for Virtual Visit: COVID-19*  Social distancing based on CDC and AMA recommendations.   I contacted Susan Houston on 03/07/2019 at 9:34 AM by telephone and clearly identified myself as Dionisio David, NP. I verified that I was speaking with the correct person using two identifiers (Name and date of birth: 11/23/1960).  Advanced Informed Consent I sought verbal advanced consent from Susan Houston for telemedicine interactions and virtual visit. I informed Susan Houston of the security and privacy concerns, risks, and limitations associated with performing an evaluation and management service by telephone. I also informed Susan Houston of the availability of "in person" appointments and I informed her of the possibility of a patient responsible charge related to this service. Susan Houston expressed understanding and agreed to proceed.   Historic Elements   Susan Houston is a 58 y.o. year old, female patient evaluated today after her last encounter by our practice on 02/27/2019. Susan Houston  has a past medical history of Anemia, Anxiety, Arthritis, Asthma, Back pain, Bruises easily, Chronic leg pain, COPD (chronic obstructive pulmonary disease) ([place]), Depression, Difficulty sleeping, Diverticulitis, Dysrhythmia (04/2018), Facet syndrome, lumbar (03/28/2015), Hemorrhoids, History of kidney stones  (04/2018), Hyperlipidemia, Hypertension (2019), Incontinence of urine, Laryngeal cancer ([place]) (2009), Migraine, Migraine without aura and without status migrainosus, not intractable (09/11/2016), Mild cognitive impairment (11/23/2017), MRSA infection greater than 3 months ago, Nerve damage, Nodule of lower lobe of right lung (04/22/2018), Oxygen deficiency, Primary cancer of right lower lobe of lung ([place]) (04/18/2018), and Wears dentures. She also  has a past surgical history that includes Bladder suspension (2014); Tonsillectomy; Excision neuroma (Bilateral, 09/24/2014); Esophagogastroduodenoscopy (2015); Colonoscopy; Esophagogastroduodenoscopy (egd) with propofol (N/A, 11/20/2016); Joint replacement (2009/2010); Abdominal hysterectomy (1983); Eye surgery (Left, 05/11/2018); Cholecystectomy (1998); Cataract extraction w/PHACO (Left, 05/11/2018); Thoracotomy/lobectomy (Right, 05/16/2018); Video bronchoscopy (N/A, 05/16/2018); Cataract extraction w/PHACO (Right, 08/11/2018); and Total knee revision (Left, 11/29/2018). Susan Houston has a current medication list which includes the following prescription(s): albuterol, albuterol, estradiol, fluoxetine, guaifenesin-codeine, hydroxyzine, nystatin cream, omeprazole, oxycodone-acetaminophen, oxygen-helium, pregabalin, suvorexant, and tramadol, and the following Facility-Administered Medications: ipratropium-albuterol. She  reports that she quit smoking about 2 years ago. Her smoking use included cigarettes. She smoked 0.50 packs per day. She has never used smokeless tobacco. She reports that she does not drink alcohol or use drugs. Susan Houston is allergic to morphine and related; cymbalta [duloxetine hcl]; adhesive [tape]; and lexapro [escitalopram oxalate].   HPI  I last saw her on Visit date not found. She is being evaluated for medication management. She is having 5/10 right rib pain underneath her breast area and in between breast area, sternum area. She denies any numbness or  tingling. This is a constant pain. It has remained about the same. She describes it as a annoying pain. She feels like the Lyrica has been effective for her pain. She admits the Lyrica seems to bring her pain down to a 1/10. However she ran  out of the medication. She denies any side effects at all with the lyrica   Pharmacotherapy Assessment  Analgesic: None MME/day: 0 mg/day.   Monitoring: Pharmacotherapy: No side-effects or adverse reactions reported. [place] PMP: PDMP reviewed during this encounter.       Compliance: No problems identified. Plan: Refer to "POC".  Review of recent tests  MR Thoracic Spine W Wo Contrast CLINICAL DATA:  Mid back pain for the past 3 weeks. History of lung cancer.  EXAM: MRI THORACIC WITHOUT AND WITH CONTRAST  TECHNIQUE: Multiplanar and multiecho pulse sequences of the thoracic spine were obtained without and with intravenous contrast.  CONTRAST:  9 mL Gadavist intravenous contrast.  COMPARISON:  PET-CT dated Feb 28, 2019. CT chest dated October 28, 2018.  FINDINGS: Alignment:  Physiologic.  Vertebrae: No fracture, evidence of discitis, or suspicious bone lesion. T10 hemangioma.  Cord:  Normal signal and morphology.  No intradural enhancement.  Paraspinal and other soft tissues: Negative.  Disc levels:  No significant disc bulge or herniation. No spinal canal or neuroforaminal stenosis.  IMPRESSION: 1. No evidence of osseous metastatic disease. 2. No acute osseous abnormality or significant degenerative changes.  Electronically Signed   By: Titus Dubin M.D.   On: 03/06/2019 16:03   Orders Only on 03/03/2019  Component Date Value Ref Range Status  . Color, Urine 03/03/2019 YELLOW* YELLOW Final  . APPearance 03/03/2019 CLEAR* CLEAR Final  . Specific Gravity, Urine 03/03/2019 1.010  1.005 - 1.030 Final  . pH 03/03/2019 7.0  5.0 - 8.0 Final  . Glucose, UA 03/03/2019 NEGATIVE  NEGATIVE mg/dL Final  . Hgb urine dipstick 03/03/2019  LARGE* NEGATIVE Final  . Bilirubin Urine 03/03/2019 NEGATIVE  NEGATIVE Final  . Ketones, ur 03/03/2019 NEGATIVE  NEGATIVE mg/dL Final  . Protein, ur 03/03/2019 NEGATIVE  NEGATIVE mg/dL Final  . Nitrite 03/03/2019 NEGATIVE  NEGATIVE Final  . Leukocytes,Ua 03/03/2019 MODERATE* NEGATIVE Final  . RBC / HPF 03/03/2019 >50* 0 - 5 RBC/hpf Final  . WBC, UA 03/03/2019 0-5  0 - 5 WBC/hpf Final  . Bacteria, UA 03/03/2019 NONE SEEN  NONE SEEN Final  . Squamous Epithelial / LPF 03/03/2019 0-5  0 - 5 Final   Performed at [facility], [address] [place], [place] [address]   Assessment  The primary encounter diagnosis was Chronic pain syndrome. Diagnoses of Costochondritis, Incisional pain (Secondary Area of Pain), Chronic post-thoracotomy pain, Intercostal pain (Right), Fibromyalgia syndrome, Intercostal neuralgia, and Neurogenic pain were also pertinent to this visit.  Plan of Care  I have discontinued Kaileen C. Reichenbach's pregabalin. I am also having her start on pregabalin. Additionally, I am having her maintain her albuterol, guaiFENesin-codeine, OXYGEN, omeprazole, albuterol, FLUoxetine, estradiol, Suvorexant, hydrOXYzine, traMADol, nystatin cream, and oxyCODONE-acetaminophen.  Pharmacotherapy (Medications Ordered): Meds ordered this encounter  Medications  . pregabalin (LYRICA) 50 MG capsule    Sig: Take 1 capsule (50 mg total) by mouth 3 (three) times daily.    Dispense:  90 capsule    Refill:  2    Do not place this medication, or any other prescription from our practice, on "Automatic Refill". Patient may have prescription filled one day early if pharmacy is closed on scheduled refill date.    Order Specific Question:   Supervising Provider    Answer:   Milinda Pointer [phone]   Orders:  No orders of the defined types were placed in this encounter.  Follow-up plan:   Return in about 3 months (around 06/07/2019) for MedMgmt.  Lyrica 25mg  TID increased to Lyrica 50mg  TID . Pt  encouraged to call if there is any swelling or additional side effects noted. Verbalized understanding.     I discussed the assessment and treatment plan with the patient. The patient was provided an opportunity to ask questions and all were answered. The patient agreed with the plan and demonstrated an understanding of the instructions.  Patient advised to call back or seek an in-person evaluation if the symptoms or condition worsens.  Total duration of non-face-to-face encounter: 13 minutes.  Note by: Dionisio David, NP Date: 03/07/2019; Time: 9:34 AM  Disclaimer:  * Given the special circumstances of the COVID-19 pandemic, the federal government has announced that the Office for Civil Rights (OCR) will exercise its enforcement discretion and will not impose penalties on physicians using telehealth in the event of noncompliance with regulatory requirements under the [place] (HIPAA) in connection with the good faith provision of telehealth during the UJWJX-91 national public health emergency. ([place])

## 2019-03-08 NOTE — Telephone Encounter (Signed)
[facility] [employer] and was advised that [facility] was having issues with their system, was disconnected. Will try to obtain PA tomorrow. [name]

## 2019-03-09 ENCOUNTER — Encounter: Payer: Self-pay | Admitting: Family Medicine

## 2019-03-09 ENCOUNTER — Ambulatory Visit (INDEPENDENT_AMBULATORY_CARE_PROVIDER_SITE_OTHER): Payer: Medicare Other | Admitting: Family Medicine

## 2019-03-09 ENCOUNTER — Other Ambulatory Visit: Payer: Medicare Other

## 2019-03-09 ENCOUNTER — Other Ambulatory Visit: Payer: Self-pay

## 2019-03-09 DIAGNOSIS — R8290 Unspecified abnormal findings in urine: Secondary | ICD-10-CM | POA: Diagnosis not present

## 2019-03-09 DIAGNOSIS — R300 Dysuria: Secondary | ICD-10-CM

## 2019-03-09 NOTE — Telephone Encounter (Signed)
03/09/2019 at 2:23 pm EST spoke with [name] at [facility]. Procedure codes [phone] and [phone] are valid and billable codes which does not require PA.  Call Ref # [phone]. [name]

## 2019-03-09 NOTE — Progress Notes (Signed)
There were no vitals taken for this visit.   Subjective:    Patient ID: Susan Houston, female    DOB: 05-Jan-1961, 58 y.o.   MRN: [id number]  HPI: Susan Houston is a 58 y.o. female  Chief Complaint  Patient presents with  . foul urine   URINARY SYMPTOMS Duration: 3-4 days Dysuria: no Urinary frequency: no Urgency: no Small volume voids: yes Symptom severity: mild Urinary incontinence: no Foul odor: yes Hematuria: no Abdominal pain: no Back pain: no Suprapubic pain/pressure: no Flank pain: no Fever:  no Vomiting: no Relief with cranberry juice: no Relief with pyridium: no Status: stable Previous urinary tract infection: yes Recurrent urinary tract infection: no Treatments attempted: none   Relevant past medical, surgical, family and social history reviewed and updated as indicated. Interim medical history since our last visit reviewed. Allergies and medications reviewed and updated.  Review of Systems  Constitutional: Negative.   Respiratory: Negative.   Cardiovascular: Negative.   Genitourinary: Negative for decreased urine volume, difficulty urinating, dyspareunia, dysuria, enuresis, flank pain, frequency, genital sores, hematuria, menstrual problem, pelvic pain, urgency, vaginal bleeding, vaginal discharge and vaginal pain.       Foul smelling urine  Musculoskeletal: Negative.   Psychiatric/Behavioral: Negative.     Per HPI unless specifically indicated above     Objective:    There were no vitals taken for this visit.  Wt Readings from Last 3 Encounters:  03/03/19 199 lb 12.8 oz (90.6 kg)  02/17/19 190 lb 6.4 oz (86.4 kg)  11/29/18 195 lb 8.8 oz (88.7 kg)    Physical Exam Vitals signs and nursing note reviewed.  Constitutional:      General: She is not in acute distress.    Appearance: Normal appearance. She is not ill-appearing, toxic-appearing or diaphoretic.  HENT:     Head: Normocephalic and atraumatic.     Right Ear: External ear normal.   Left Ear: External ear normal.     Nose: Nose normal.     Mouth/Throat:     Mouth: Mucous membranes are moist.     Pharynx: Oropharynx is clear.  Eyes:     General: No scleral icterus.       Right eye: No discharge.        Left eye: No discharge.     Conjunctiva/sclera: Conjunctivae normal.     Pupils: Pupils are equal, round, and reactive to light.  Neck:     Musculoskeletal: Normal range of motion.  Pulmonary:     Effort: Pulmonary effort is normal. No respiratory distress.     Comments: Speaking in full sentences Musculoskeletal: Normal range of motion.  Skin:    Coloration: Skin is not jaundiced or pale.     Findings: No bruising, erythema, lesion or rash.  Neurological:     Mental Status: She is alert and oriented to person, place, and time. Mental status is at baseline.  Psychiatric:        Mood and Affect: Mood normal.        Behavior: Behavior normal.        Thought Content: Thought content normal.        Judgment: Judgment normal.     Results for orders placed or performed in visit on 03/03/19  Urinalysis, Complete w Microscopic  Result Value Ref Range   Color, Urine YELLOW (A) YELLOW   APPearance CLEAR (A) CLEAR   Specific Gravity, Urine 1.010 1.005 - 1.030   pH 7.0 5.0 - 8.0  Glucose, UA NEGATIVE NEGATIVE mg/dL   Hgb urine dipstick LARGE (A) NEGATIVE   Bilirubin Urine NEGATIVE NEGATIVE   Ketones, ur NEGATIVE NEGATIVE mg/dL   Protein, ur NEGATIVE NEGATIVE mg/dL   Nitrite NEGATIVE NEGATIVE   Leukocytes,Ua MODERATE (A) NEGATIVE   RBC / HPF >50 (H) 0 - 5 RBC/hpf   WBC, UA 0-5 0 - 5 WBC/hpf   Bacteria, UA NONE SEEN NONE SEEN   Squamous Epithelial / LPF 0-5 0 - 5      Assessment & Plan:   Problem List Items Addressed This Visit    None    Visit Diagnoses    Foul smelling urine    -  Primary   Trace leuks. Will await culture. Otherwise asymptomatic. Increase fluids. Call with any concerns or getting worse.        Follow up plan: Return if symptoms  worsen or fail to improve.   . This visit was completed via FaceTime due to the restrictions of the COVID-19 pandemic. All issues as above were discussed and addressed. Physical exam was done as above through visual confirmation on FaceTime. If it was felt that the patient should be evaluated in the office, they were directed there. The patient verbally consented to this visit. . Location of the patient: home . Location of the provider: home . Those involved with this call:  . Provider: Park Liter, DO . CMA: Tiffany Reel, CMA . Front Desk/Registration: Don Perking  . Time spent on call: 15 minutes with patient face to face via video conference. More than 50% of this time was spent in counseling and coordination of care. 23 minutes total spent in review of patient's record and preparation of their chart.

## 2019-03-10 ENCOUNTER — Encounter
Admission: RE | Admit: 2019-03-10 | Discharge: 2019-03-10 | Disposition: A | Discharge status: Home / Self Care | Payer: Medicare Other | Source: Ambulatory Visit | Attending: Internal Medicine | Admitting: Internal Medicine

## 2019-03-10 DIAGNOSIS — Z01812 Encounter for preprocedural laboratory examination: Secondary | ICD-10-CM | POA: Insufficient documentation

## 2019-03-10 HISTORY — DX: Gastro-esophageal reflux disease without esophagitis: K21.9

## 2019-03-10 HISTORY — DX: Dyspnea, unspecified: R06.00

## 2019-03-10 LAB — CBC
HCT: 35.7 % — ABNORMAL LOW (ref 36.0–46.0)
Hemoglobin: 10.8 g/dL — ABNORMAL LOW (ref 12.0–15.0)
MCH: 25.4 pg — ABNORMAL LOW (ref 26.0–34.0)
MCHC: 30.3 g/dL (ref 30.0–36.0)
MCV: 83.8 fL (ref 80.0–100.0)
Platelets: 224 10*3/uL (ref 150–400)
RBC: 4.26 MIL/uL (ref 3.87–5.11)
RDW: 13.5 % (ref 11.5–15.5)
WBC: 5 10*3/uL (ref 4.0–10.5)
nRBC: 0 % (ref 0.0–0.2)

## 2019-03-10 LAB — BASIC METABOLIC PANEL
Anion gap: 8 (ref 5–15)
BUN: 9 mg/dL (ref 6–20)
CO2: 29 mmol/L (ref 22–32)
Calcium: 8.9 mg/dL (ref 8.9–10.3)
Chloride: 102 mmol/L (ref 98–111)
Creatinine, Ser: 0.85 mg/dL (ref 0.44–1.00)
GFR calc Af Amer: >60 mL/min (ref >60)
GFR calc non Af Amer: >60 mL/min (ref >60)
Glucose, Bld: 123 mg/dL — ABNORMAL HIGH (ref 70–99)
Potassium: 3.9 mmol/L (ref 3.5–5.1)
Sodium: 139 mmol/L (ref 135–145)

## 2019-03-10 LAB — UA/M W/RFLX CULTURE, ROUTINE
Bilirubin, UA: NEGATIVE
Glucose, UA: NEGATIVE
Ketones, UA: NEGATIVE
Nitrite, UA: NEGATIVE
Protein,UA: NEGATIVE
Specific Gravity, UA: 1.01 (ref 1.005–1.030)
Urobilinogen, Ur: 0.2 mg/dL (ref 0.2–1.0)
pH, UA: 5.5 (ref 5.0–7.5)

## 2019-03-10 LAB — MICROSCOPIC EXAMINATION

## 2019-03-10 LAB — URINE CULTURE, REFLEX

## 2019-03-10 NOTE — Pre-Procedure Instructions (Signed)
2019 July Echo complete7/01/2018 [name] Component Name Value Ref Range  LV Ejection Fraction (%) 55   Aortic Valve Regurgitation Grade trivial   Aortic Valve Stenosis Grade none   Aortic Valve Max Velocity (m/s) 1.3 m/sec  Aortic Valve Stenosis Mean Gradient (mmHg) 3.1 mmHg  Mitral Valve Regurgitation Grade mild   Mitral Valve Stenosis Grade none   Tricuspid Valve Regurgitation Grade mild   Tricuspid Valve Regurgitation Max Velocity (m/s) 2.6 m/sec  Right Ventricle Systolic Pressure (mmHg) 02.4 mmHg  LV End Diastolic Diameter (cm) 4.6 cm  LV End Systolic Diameter (cm) 3.2 cm  LV Septum Wall Thickness (cm) 1.1 cm  LV Posterior Wall Thickness (cm) 1.1 cm  Left Atrium Diameter (cm) 4.1 cm  Result Narrative   CARDIOLOGY DEPARTMENT Susan Houston, Susan Houston [place] #: [id number] [address], [place], [place] [address] Date: 04/13/2018 08: 45 AM  Adult Female Age: 58 yrs ECHOCARDIOGRAM REPORTOutpatient  Susan Houston  STUDY:CHEST WALL TAPE:0000: 00: 0: 00: 00 MD1: Houston, Dwayne, MD ECHO:YesDOPPLER:Yes FILE:0000-000-000BP: 122/82 mmHg  COLOR:Yes CONTRAST:No MACHINE:Philips  RV BIOPSY:No3D:NoSOUND QLTY:ModerateHeight: 62 in MEDIUM:NoneWeight: 192 lb  BSA: 1.9 m2 _________________________________________________________________________________________ HISTORY: DOE, Chest pain  REASON: Assess, LV function   INDICATION: R06.02 Shortness of breath, I20.8 Other forms of angina pectoris _________________________________________________________________________________________ ECHOCARDIOGRAPHIC MEASUREMENTS 2D DIMENSIONS AORTAValues Normal Range MAIN PA ValuesNormal Range Annulus: nm*[2.1-2.5] PA Main: nm* [1.5-2.1] Aorta Sin: 2.9 cm [2.7-3.3]RIGHT VENTRICLE ST Junction: nm*[2.3-2.9] RV Base: nm* [<4.2] Asc.Aorta: nm*[2.3-3.1]RV Mid: 3.2 cm[<3.5] LEFT VENTRICLERV Length: nm* [<8.6] LVIDd: 4.6 cm [3.9-5.3]INFERIOR VENA CAVA LVIDs: 3.2 cmMax. IVC: nm* [<=2.1]  FS: 30.1 % [>25]Min. IVC: nm* SWT: 1.1 cm [0.5-0.9]------------------ PWT: 1.1 cm [0.5-0.9]nm* - not measured LEFT ATRIUM LA Diam: 4.1 cm [2.7-3.8] LA A4C Area: nm*[<20] LA Volume: nm*[22-52] _________________________________________________________________________________________ ECHOCARDIOGRAPHIC DESCRIPTIONS AORTIC ROOT  Size: Normal  Dissection: INDETERM FOR DISSECTION AORTIC VALVE  Leaflets: Tricuspid Morphology: Normal  Mobility: Fully mobile LEFT VENTRICLE  Size: NormalAnterior: Normal Contraction: Normal Lateral: Normal  Closest EF: >55% (Estimated)Septal: Normal LV Masses: No Masses Apical: Normal LVH: NoneInferior: Normal  Posterior: Normal  Dias.FxClass: (Grade 1) relaxation abnormal, E/A reversal MITRAL VALVE  Leaflets: NormalMobility: Fully mobile  Morphology: Normal LEFT ATRIUM  Size: Normal LA Masses: No masses IA Septum: Normal IAS MAIN PA  Size: Normal PULMONIC VALVE  Morphology: NormalMobility: Fully mobile RIGHT VENTRICLE RV Masses: No Masses Size: Normal Free Wall: Normal Contraction: Normal TRICUSPID VALVE  Leaflets: NormalMobility: Fully mobile  Morphology: Normal RIGHT ATRIUM  Size: NormalRA Other: None RA Mass: No masses PERICARDIUM Fluid: No effusion INFERIOR VENACAVA  Size: Normal Normal respiratory collapse _________________________________________________________________________________________  DOPPLER ECHO and OTHER SPECIAL PROCEDURES  Aortic: TRIVIAL AR No AS  126.9 cm/sec peak vel6.4 mmHg peak grad  3.1 mmHg mean grad 2.4 cm^2 by DOPPLER  Mitral: MILD MRNo MS  MV Inflow E Vel = 113.0 cm/secMV Annulus E'Vel = 6.5 cm/sec  E/E'Ratio = 17.4 Tricuspid: MILD TRNo TS  257.9 cm/sec peak TR vel 31.6 mmHg peak RV pressure Pulmonary: TRIVIAL PR No PS _________________________________________________________________________________________ INTERPRETATION NORMAL LEFT VENTRICULAR SYSTOLIC FUNCTION WITH AN ESTIMATED EF = >55 % NORMAL  RIGHT VENTRICULAR SYSTOLIC FUNCTION MILD TRICUSPID AND MITRAL VALVE INSUFFICIENCY TRACE AORTIC VALVE INSUFFICIENCY NO VALVULAR STENOSIS _________________________________________________________________________________________ Electronically signed Susan Benton, MD on 04/21/2018 06: 33 PM  Performed By: Susan Houston, RDCS, RVT  Ordering Physician: Susan Amel, MD _________________________________________________________________________________________  Other Result Information  Interface, Text Results In - 04/21/2018  6:33 PM EDT  CARDIOLOGY DEPARTMENT                 Susan Houston, Susan Houston                                    [name]           [name] #: [id number]           [address] [place], [place] [address]       Date: 04/13/2018 08: 53 AM                                                              Adult   Female   Age: 35 yrs           ECHOCARDIOGRAM REPORT                              Outpatient                                                              Susan Houston      STUDY:CHEST WALL               TAPE:0000: 00: 0: 00: 00 MD1: Houston, Dwayne, MD       ECHO:Yes    DOPPLER:Yes       FILE:0000-000-000        BP: 122/82 mmHg      COLOR:Yes   CONTRAST:No     MACHINE:Philips  RV BIOPSY:No          3D:No  SOUND QLTY:Moderate            Height: 62 in     MEDIUM:None                                              Weight: 192 lb                                                              BSA: 1.9 m2 _________________________________________________________________________________________               HISTORY: DOE, Chest pain                REASON: Assess, LV function            INDICATION: R06.02 Shortness of breath, I20.8 Other forms of angina pectoris _________________________________________________________________________________________ ECHOCARDIOGRAPHIC MEASUREMENTS  2D DIMENSIONS AORTA  Values   Normal Range   MAIN PA         Values    Normal Range               Annulus: nm*          [2.1-2.5]         PA Main: nm*       [1.5-2.1]             Aorta Sin: 2.9 cm       [2.7-3.3]    RIGHT VENTRICLE           ST Junction: nm*          [2.3-2.9]         RV Base: nm*       [<4.2]             Asc.Aorta: nm*          [2.3-3.1]          RV Mid: 3.2 cm    [<3.5] LEFT VENTRICLE                                      RV Length: nm*       [<8.6]                 LVIDd: 4.6 cm       [3.9-5.3]    INFERIOR VENA CAVA                 LVIDs: 3.2 cm                        Max. IVC: nm*       [<=2.1]                    FS: 30.1 %       [>25]            Min. IVC: nm*                   SWT: 1.1 cm       [0.5-0.9]    ------------------                   PWT: 1.1 cm       [0.5-0.9]    nm* - not measured LEFT ATRIUM               LA Diam: 4.1 cm       [2.7-3.8]           LA A4C Area: nm*          [<20]             LA Volume: nm*          [22-52] _________________________________________________________________________________________ ECHOCARDIOGRAPHIC DESCRIPTIONS AORTIC ROOT                  Size: Normal            Dissection: INDETERM FOR DISSECTION AORTIC VALVE              Leaflets: Tricuspid                   Morphology: Normal              Mobility: Fully mobile LEFT VENTRICLE  Size: Normal                        Anterior: Normal           Contraction: Normal                         Lateral: Normal            Closest EF: >55% (Estimated)                Septal: Normal             LV Masses: No Masses                       Apical: Normal                   LVH: None                          Inferior: Normal                                                     Posterior: Normal          Dias.FxClass: (Grade 1) relaxation abnormal, E/A reversal MITRAL VALVE              Leaflets: Normal                        Mobility: Fully mobile             Morphology: Normal LEFT ATRIUM                  Size: Normal                       LA Masses: No masses             IA Septum: Normal IAS MAIN PA                  Size: Normal PULMONIC VALVE            Morphology: Normal                        Mobility: Fully mobile RIGHT VENTRICLE             RV Masses: No Masses                         Size: Normal             Free Wall: Normal                     Contraction: Normal TRICUSPID VALVE              Leaflets: Normal                        Mobility: Fully mobile            Morphology: Normal RIGHT ATRIUM                  Size: Normal  RA Other: None               RA Mass: No masses PERICARDIUM                 Fluid: No effusion INFERIOR VENACAVA                  Size: Normal Normal respiratory collapse _________________________________________________________________________________________  DOPPLER ECHO and OTHER SPECIAL PROCEDURES                Aortic: TRIVIAL AR                 No AS                        126.9 cm/sec peak vel      6.4 mmHg peak grad                        3.1 mmHg mean grad         2.4 cm^2 by DOPPLER                Mitral: MILD MR                    No MS                        MV Inflow E Vel = 113.0 cm/sec      MV Annulus E'Vel = 6.5 cm/sec                        E/E'Ratio = 17.4             Tricuspid: MILD TR                    No TS                        257.9 cm/sec peak TR vel   31.6 mmHg peak RV pressure             Pulmonary: TRIVIAL PR                 No PS _________________________________________________________________________________________ INTERPRETATION NORMAL LEFT VENTRICULAR SYSTOLIC FUNCTION WITH AN ESTIMATED EF = >55 % NORMAL RIGHT VENTRICULAR SYSTOLIC FUNCTION MILD TRICUSPID AND MITRAL VALVE INSUFFICIENCY TRACE AORTIC VALVE INSUFFICIENCY NO VALVULAR STENOSIS _________________________________________________________________________________________ Electronically  signed by    Susan Amel, MD on 04/21/2018 06: 33 PM          Performed By: Susan Houston, RDCS, RVT    Ordering Physician: Susan Amel, MD _________________________________________________________________________________________

## 2019-03-10 NOTE — Pre-Procedure Instructions (Signed)
NM myocardial perfusion SPECT multiple (stress and rest)04/18/2018 [place] Result Impression   Normal myocardial perfusion scan no evidence of stress-induced  myocardial ischemia ejection fraction is 78% conclusion negative scan  Other Result Information  This result has an attachment that is not available.  Result Narrative  CARDIOLOGY DEPARTMENT [facility] [place], [id number] [phone]  Procedure: Exercise Myocardial Perfusion Imaging ONE day procedure  Indication: Angina at rest (CMS-HCC) Plan: ECG stress test only  Ordering Physician:   Dr. Lujean Amel   Clinical History: 58 y.o. year old female recent anginal symptoms preop for surgery vitals: Height: 62 inWeight: 192 lb Cardiac risk factors include:  Smoking    Procedure: The patient performed treadmill exercise using a Bruce protocol for 3.49  minutes. The exercise test was stopped due to fatigue.Blood pressure  response was normal. The patient did not develop any symptoms other than  fatigue during the procedure  Rest HR: 72bpm Rest BP: 130 26mmHg Max HR: 142 bpm Max BP: 140 41mmHg Mets: 5.10 % MAX HR: 87%  Stress Test Administered by: Zenda Alpers CMA  ECG Interpretation: Rest LHT:DSKAJG sinus rhythm, none Stress OTL:XBWIO tachycardia,  Recovery MBT:DHRCBU sinus rhythm ECG Interpretation:negative, nondiagnostic changes.   Administrations This Visit  technetium Tc28m sestamibi (CARDIOLITE) injection 38.45 millicurie  Admin Date 36/46/8032 Action Given Dose 12.24 millicurie Route Intravenous Administered By Herbert Seta, CNMT     technetium Tc69m sestamibi (CARDIOLITE) injection 8.25 millicurie  Admin Date 00/37/0488 Action Given Dose 8.91 millicurie Route Intravenous Administered By Herbert Seta, CNMT       Gated post-stress perfusion imaging was performed 30 minutes  after stress.  Rest images were performed 30 minutes after injection.  Gated LV Analysis:  TID:1.02  LVEF= 78 %  FINDINGS: Regional wall motion:reveals normal myocardial thickening and wall  motion. The overall quality of the study is good. Artifacts noted: no Left ventricular cavity: normal.  Perfusion Analysis:SPECT images demonstrate homogeneous tracer  distribution throughout the myocardium.  Status Results Details

## 2019-03-10 NOTE — Patient Instructions (Signed)
Your procedure is scheduled on: 03-20-19 MONDAY Report to Same Day Surgery 2nd floor medical mall [facility] Entrance-take elevator on left to 2nd floor.  Check in with surgery information desk.) To find out your arrival time please call [phone] between 1PM - 3PM on 03-17-19 FRIDAY  Remember: Instructions that are not followed completely may result in serious medical risk, up to and including death, or upon the discretion of your surgeon and anesthesiologist your surgery may need to be rescheduled.    _x___ 1. Do not eat food after midnight the night before your procedure. NO GUM OR CANDY AFTER MIDNIGHT.  You may drink clear liquids up to 2 hours before you are scheduled to arrive at the hospital for your procedure.  Do not drink clear liquids within 2 hours of your scheduled arrival to the hospital.  Clear liquids include  --Water or Apple juice without pulp  --Clear carbohydrate beverage such as ClearFast or Gatorade  --Black Coffee or Clear Tea (No milk, no creamers, do not add anything to the coffee or Tea   ____Ensure clear carbohydrate drink on the way to the hospital for bariatric patients  ____Ensure clear carbohydrate drink 3 hours before surgery for Dr Dwyane Luo patients if physician instructed.    __x__ 2. No Alcohol for 24 hours before or after surgery.   __x__3. No Smoking or e-cigarettes for 24 prior to surgery.  Do not use any chewable tobacco products for at least 6 hour prior to surgery   ____  4. Bring all medications with you on the day of surgery if instructed.    __x__ 5. Notify your doctor if there is any change in your medical condition     (cold, fever, infections).    x___6. On the morning of surgery brush your teeth with toothpaste and water.  You may rinse your mouth with mouth wash if you wish.  Do not swallow any toothpaste or mouthwash.   Do not wear jewelry, make-up, hairpins, clips or nail polish.  Do not wear lotions, powders, or perfumes. You  may wear deodorant.  Do not shave 48 hours prior to surgery. Men may shave face and neck.  Do not bring valuables to the hospital.    [facility] is not responsible for any belongings or valuables.               Contacts, dentures or bridgework may not be worn into surgery.  Leave your suitcase in the car. After surgery it may be brought to your room.  For patients admitted to the hospital, discharge time is determined by your treatment team.  _  Patients discharged the day of surgery will not be allowed to drive home.  You will need someone to drive you home and stay with you the night of your procedure.    Please read over the following fact sheets that you were given:   [facility] Preparing for Surgery   _x___ TAKE THE FOLLOWING MEDICATION THE MORNING OF SURGERY WITH A SMALL SIP OF WATER. These include:  1. PROZAC (FLUOXETINE)  2. TRAMADOL (ULTRAM)  3. LYRICA (PREGABALIN)  4. HYDRALAZINE  5. PRILOSEC (OMEPRAZOLE)  6.TAKE AN EXTRA PRILOSEC THE NIGHT BEFORE YOUR SURGERY  ____Fleets enema or Magnesium Citrate as directed.   ____ Use CHG Soap or sage wipes as directed on instruction sheet   _X___ Use inhalers on the day of surgery and bring to hospital day of surgery-USE YOUR ALBUTEROL NEBULIZER THE MORNING OF SURGERY AND BRING  YOUR ALBUTEROL [place]  ____ Stop Metformin and Janumet 2 days prior to surgery.    ____ Take 1/2 of usual insulin dose the night before surgery and none on the morning surgery.   _x___ Follow recommendations from Cardiologist, Pulmonologist or PCP regarding stopping Aspirin, Coumadin, Plavix ,Eliquis, Effient, or Pradaxa, and Pletal-STOP ASPIRIN NOW  X____Stop Anti-inflammatories such as Advil, Aleve, Ibuprofen, Motrin, Naproxen, Naprosyn, Goodies powders,EXCEDRIN MIGRAINE or aspirin products NOW-OK to take Tylenol OR TRAMADOL IF NEEDED   ____ Stop supplements until after surgery.     ____ Bring C-Pap to the hospital.

## 2019-03-15 ENCOUNTER — Telehealth: Payer: Self-pay

## 2019-03-15 NOTE — Telephone Encounter (Signed)
Called patient for COVID-19 pre-screening for in office visit.  Have you recently traveled any where out of the local area in the last 2 weeks? no  Have you been in close contact with a person diagnosed with COVID-19 within the last 2 weeks? no  Do you currently have any of the following symptoms? If so, when did they start? Cough     Diarrhea   Joint Pain Fever      Muscle Pain   Red eyes Shortness of breath- all the time Abdominal pain  Vomiting Loss of smell    Rash    Sore Throat Headache    Weakness   Bruising or bleeding   Okay to proceed with visit.

## 2019-03-16 ENCOUNTER — Ambulatory Visit (INDEPENDENT_AMBULATORY_CARE_PROVIDER_SITE_OTHER): Payer: Medicare Other | Admitting: Internal Medicine

## 2019-03-16 ENCOUNTER — Encounter: Payer: Self-pay | Admitting: Internal Medicine

## 2019-03-16 ENCOUNTER — Other Ambulatory Visit
Admission: RE | Admit: 2019-03-16 | Discharge: 2019-03-16 | Disposition: A | Discharge status: Home / Self Care | Payer: Medicare Other | Source: Ambulatory Visit | Attending: Internal Medicine | Admitting: Internal Medicine

## 2019-03-16 ENCOUNTER — Other Ambulatory Visit: Payer: Self-pay

## 2019-03-16 VITALS — BP 120/74 | HR 69 | Temp 98.0°F | Ht 62.0 in | Wt 200.0 lb

## 2019-03-16 DIAGNOSIS — R591 Generalized enlarged lymph nodes: Secondary | ICD-10-CM

## 2019-03-16 DIAGNOSIS — R599 Enlarged lymph nodes, unspecified: Secondary | ICD-10-CM

## 2019-03-16 DIAGNOSIS — Z1159 Encounter for screening for other viral diseases: Secondary | ICD-10-CM | POA: Insufficient documentation

## 2019-03-16 DIAGNOSIS — J449 Chronic obstructive pulmonary disease, unspecified: Secondary | ICD-10-CM

## 2019-03-16 NOTE — Patient Instructions (Addendum)
  The Risks and Benefits of the Bronchoscopy procedure with ENDOBRONCHIAL [place] were explained to patient  I have discussed the risk for Acute Bleeding, increased chance of Infection, increased chance of Respiratory Failure and Cardiac Arrest, increased chance of pneumothorax and collapsed lung, as well as increased Stroke and Death.  I have also explained to avoid all types of NSAIDs 1 week prior to procedure date  to decrease chance of bleeding, and also to avoid food and drinks the midnight prior to procedure.  The procedure consists of a video camera with a light source to be placed and inserted  into the lungs to  look for abnormal tissue and to obtain tissue samples by using needle and biopsy tools.  The patient/family understand the risks and benefits and have agreed to proceed with procedure.    COVID TESTING PENDING   CONTINUE INHALERS AND OXYGEN AS PRESCRIBED

## 2019-03-16 NOTE — H&P (View-Only) (Signed)
Name: Susan Houston MRN: [id number] DOB: 1961-09-05     STUDIES:     CXR independently reviewed by Me    7.9.19 CT chest Independently reviewed by Me RLL lung nodule seen  PFT 04/2018 Ratio 63% FEV1 76% FEF 25/75 41% Mild to moderate obstructive lung disease  Synopsis  58 yo Patient has initially had hematuria and found to have incidental finding of RLL nodule then follow up CT scan confirmed nodule on July 29 where she underwent a right thoracotomy and wedge resection of a right lower lobe mass.  The final pathology revealed a squamous cell carcinoma.  She had previously had a head neck cancer treated with radiation therapy and chemotherapy several years ago.     CC Follow up abnormal CT chest Follow-up COPD   HISTORY OF PRESENT ILLNESS: Patient has moderate COPD seems to be stable at this time Patient with chronic hypoxic respiratory failure She has intermittent wheezing with cough Chronic shortness of breath and dyspnea on exertion She benefits and uses oxygen therapy and needs this to survive  CT chest and PET scan reviewed with patient Hypermetabolic right hilar and infrahilar lymph node Strong suggestion of recurrence of lung cancer  No signs of infection at this time No signs of exacerbation at this time  She is currently on Advair and Spiriva Takes nebulizer every 4 hours as needed    The Risks and Benefits of the Bronchoscopy procedure with EBUS were explained to patient.  I have discussed the risk for Acute Bleeding, increased chance of Infection, increased chance of Respiratory Failure and Cardiac Arrest, increased chance of pneumothorax and collapsed lung, as well as increased Stroke and Death.  I have also explained to avoid all types of NSAIDs 1 week prior to procedure date  to decrease chance of bleeding, and also to avoid food and drinks the midnight prior to procedure.  The procedure consists of a video camera with a light source to be placed and  inserted  into the lungs to  look for abnormal tissue and to obtain tissue samples by using needle and biopsy tools.  The patient/family understand the risks and benefits and have agreed to proceed with procedure.    PAST MEDICAL HISTORY :   has a past medical history of Anemia, Anxiety, Arthritis, Asthma, Back pain, Bruises easily, Chronic leg pain, COPD (chronic obstructive pulmonary disease) ([place]), Depression, Difficulty sleeping, Diverticulitis, Dyspnea, Dysrhythmia (04/2018), Facet syndrome, lumbar (03/28/2015), GERD (gastroesophageal reflux disease), Hemorrhoids, History of kidney stones (04/2018), Hyperlipidemia, Incontinence of urine, Laryngeal cancer ([place]) (2009), Migraine, Migraine without aura and without status migrainosus, not intractable (09/11/2016), Mild cognitive impairment (11/23/2017), MRSA infection greater than 3 months ago, Nerve damage, Nodule of lower lobe of right lung (04/22/2018), Oxygen deficiency, Primary cancer of right lower lobe of lung (No Name) (04/18/2018), and Wears dentures.  has a past surgical history that includes Bladder suspension (2014); Tonsillectomy; Excision neuroma (Bilateral, 09/24/2014); Esophagogastroduodenoscopy (2015); Colonoscopy; Esophagogastroduodenoscopy (egd) with propofol (N/A, 11/20/2016); Abdominal hysterectomy (1983); Eye surgery (Left, 05/11/2018); Cholecystectomy (1998); Cataract extraction w/PHACO (Left, 05/11/2018); Thoracotomy/lobectomy (Right, 05/16/2018); Video bronchoscopy (N/A, 05/16/2018); Cataract extraction w/PHACO (Right, 08/11/2018); Total knee revision (Left, 11/29/2018); Joint replacement (2009/2010); and Joint replacement (2020). Prior to Admission medications   Medication Sig Start Date End Date Taking? Authorizing Provider  aspirin 81 MG tablet Take 81 mg by mouth daily.    [provider]       [provider]  gabapentin (NEURONTIN) 100 MG capsule Take 1 capsule (100  mg total) by mouth 3 (three) times daily. 05/23/18    Nestor Lewandowsky, MD  gabapentin (NEURONTIN) 100 MG capsule Take 1 capsule (100 mg total) by mouth 3 (three) times daily. 05/31/18   Nestor Lewandowsky, MD  magnesium hydroxide (MILK OF MAGNESIA) 400 MG/5ML suspension Take 5 mLs by mouth daily as needed for mild constipation or moderate constipation.    [provider]  methocarbamol (ROBAXIN) 500 MG tablet Take 500 mg by mouth every 6 (six) hours as needed.  05/20/18   [provider]  oxyCODONE-acetaminophen (PERCOCET) 7.5-325 MG tablet Take 1 tablet by mouth every 4 (four) hours as needed for severe pain. 05/31/18   Nestor Lewandowsky, MD  Prednisolon-Gatiflox-Bromfenac 1-0.5-0.075 % SUSP Apply 1 drop to eye 2 (two) times daily. 05/20/18   Nestor Lewandowsky, MD  SPIRIVA HANDIHALER 18 MCG inhalation capsule INL CONTENTS OF 1 C ONCE DAILY USING HANDIHALER 05/20/18   [provider]  tiZANidine (ZANAFLEX) 4 MG capsule Take 2 mg by mouth at bedtime.     [provider]  traMADol (ULTRAM) 50 MG tablet TK 1-2 TS PO Q 6 H 05/20/18   [provider]  traMADol (ULTRAM) 50 MG tablet Take 1 tablet (50 mg total) by mouth 4 (four) times daily. 05/31/18 05/31/19  Nestor Lewandowsky, MD  traZODone (DESYREL) 50 MG tablet Take 25 mg by mouth at bedtime.     [provider]   ADVAIR 250/50 2 puffs twice daily  Allergies  Allergen Reactions  . Morphine And Related Itching    Can tolerate with benadryl  . Cymbalta [Duloxetine Hcl] Other (See Comments)    Pt states it keeps her awake  . Adhesive [Tape] Other (See Comments)    Plastic tape rips skin and bruises her. PLEASE USE PAPER TAPE  . Lexapro [Escitalopram Oxalate] Other (See Comments)    Keeps awake for days.       Review of Systems:  Gen:  Denies  fever, sweats, chills weigh loss  +fatigue HEENT: Denies blurred vision, double vision, ear pain, eye pain, hearing loss, nose bleeds, sore throat Cardiac:  No dizziness, chest pain or heaviness, chest tightness,edema, No JVD  Resp:   No cough, -sputum production, +shortness of breath,-wheezing, -hemoptysis,  Gi: Denies swallowing difficulty, stomach pain, nausea or vomiting, diarrhea, constipation, bowel incontinence Gu:  Denies bladder incontinence, burning urine Ext:   Denies Joint pain, stiffness or swelling Skin: Denies  skin rash, easy bruising or bleeding or hives Endoc:  Denies polyuria, polydipsia , polyphagia or weight change Psych:   Denies depression, insomnia or hallucinations  Other:  All other systems negative  Physical Examination:   GENERAL:NAD, no fevers, chills, no weakness no fatigue HEAD: Normocephalic, atraumatic.  EYES: PERLA, EOMI No scleral icterus.  MOUTH: Moist mucosal membrane.  EAR, NOSE, THROAT: Clear without exudates. No external lesions.  NECK: Supple. No thyromegaly.  No JVD.  PULMONARY: CTA B/L no wheezing, rhonchi, crackles CARDIOVASCULAR: S1 and S2. Regular rate and rhythm. No murmurs GASTROINTESTINAL: Soft, nontender, nondistended. Positive bowel sounds.  MUSCULOSKELETAL: No swelling, clubbing, or edema.  NEUROLOGIC: No gross focal neurological deficits. 5/5 strength all extremities SKIN: No ulceration, lesions, rashes, or cyanosis.  PSYCHIATRIC: Insight, judgment intact. -depression -anxiety ALL OTHER ROS ARE NEGATIVE         PET scan 02/28/2019 10 only reviewed by me today Findings reviewed with patient Hypermetabolic right hilar lymph node Suggest possible recurrence  ASSESSMENT / PLAN: 58 year old pleasant white female status post resection right lower lobe  lung mass consistent with squamous cell carcinoma with mild to moderate COPD in the setting of chronic hypoxic respiratory failure with follow-up CT chest and PET scan that shows hypermetabolic right hilar and infrahilar lymph node which suggest possible recurrence of cancer  Shortness of breath and dyspnea on exertion multifactorial related to COPD and obesity and deconditioned state  Mild to moderate  COPD Gold stage C Chronic shortness of breath and dyspnea exertion stable continue to use Advair and Spiriva as prescribed Albuterol nebulizer as needed  Chronic hypoxic respiratory failure using 2 L at nighttime She uses 1 to 2 L intermittently with exertion during the day Patient uses and benefits from oxygen therapy and needs this for survival\  Right hilar lymphadenopathy with a history of squamous cell carcinoma of the lung PET scan positive for hypermetabolic right hilar area Strong suggestion for recurrence  Obesity -recommend significant weight loss -recommend changing diet  Deconditioned state -Recommend increased daily activity and exercise    COVID-19 EDUCATION: The signs and symptoms of COVID-19 were discussed with the patient and how to seek care for testing.  The importance of social distancing was discussed today. Hand Washing Techniques and avoid touching face was advised.  MEDICATION ADJUSTMENTS/LABS AND TESTS ORDERED: EBUS for Monday June 1st ordered  CURRENT MEDICATIONS REVIEWED AT LENGTH WITH PATIENT TODAY   Patient are satisfied with Plan of action and management. All questions answered Follow up in 6 months   Jazmynn Pho Patricia Pesa, M.D.  [facility]  Medical Director [address] Director [facility] Cardio-Pulmonary Department

## 2019-03-16 NOTE — Progress Notes (Signed)
Name: Susan Houston MRN: [id number] DOB: 12/30/1960     STUDIES:     CXR independently reviewed by Me    7.9.19 CT chest Independently reviewed by Me RLL lung nodule seen  PFT 04/2018 Ratio 63% FEV1 76% FEF 25/75 41% Mild to moderate obstructive lung disease  Synopsis  58 yo Patient has initially had hematuria and found to have incidental finding of RLL nodule then follow up CT scan confirmed nodule on July 29 where she underwent a right thoracotomy and wedge resection of a right lower lobe mass.  The final pathology revealed a squamous cell carcinoma.  She had previously had a head neck cancer treated with radiation therapy and chemotherapy several years ago.     CC Follow up abnormal CT chest Follow-up COPD   HISTORY OF PRESENT ILLNESS: Patient has moderate COPD seems to be stable at this time Patient with chronic hypoxic respiratory failure She has intermittent wheezing with cough Chronic shortness of breath and dyspnea on exertion She benefits and uses oxygen therapy and needs this to survive  CT chest and PET scan reviewed with patient Hypermetabolic right hilar and infrahilar lymph node Strong suggestion of recurrence of lung cancer  No signs of infection at this time No signs of exacerbation at this time  She is currently on Advair and Spiriva Takes nebulizer every 4 hours as needed    The Risks and Benefits of the Bronchoscopy procedure with EBUS were explained to patient.  I have discussed the risk for Acute Bleeding, increased chance of Infection, increased chance of Respiratory Failure and Cardiac Arrest, increased chance of pneumothorax and collapsed lung, as well as increased Stroke and Death.  I have also explained to avoid all types of NSAIDs 1 week prior to procedure date  to decrease chance of bleeding, and also to avoid food and drinks the midnight prior to procedure.  The procedure consists of a video camera with a light source to be placed and  inserted  into the lungs to  look for abnormal tissue and to obtain tissue samples by using needle and biopsy tools.  The patient/family understand the risks and benefits and have agreed to proceed with procedure.    PAST MEDICAL HISTORY :   has a past medical history of Anemia, Anxiety, Arthritis, Asthma, Back pain, Bruises easily, Chronic leg pain, COPD (chronic obstructive pulmonary disease) ([place]), Depression, Difficulty sleeping, Diverticulitis, Dyspnea, Dysrhythmia (04/2018), Facet syndrome, lumbar (03/28/2015), GERD (gastroesophageal reflux disease), Hemorrhoids, History of kidney stones (04/2018), Hyperlipidemia, Incontinence of urine, Laryngeal cancer ([place]) (2009), Migraine, Migraine without aura and without status migrainosus, not intractable (09/11/2016), Mild cognitive impairment (11/23/2017), MRSA infection greater than 3 months ago, Nerve damage, Nodule of lower lobe of right lung (04/22/2018), Oxygen deficiency, Primary cancer of right lower lobe of lung ([place]) (04/18/2018), and Wears dentures.  has a past surgical history that includes Bladder suspension (2014); Tonsillectomy; Excision neuroma (Bilateral, 09/24/2014); Esophagogastroduodenoscopy (2015); Colonoscopy; Esophagogastroduodenoscopy (egd) with propofol (N/A, 11/20/2016); Abdominal hysterectomy (1983); Eye surgery (Left, 05/11/2018); Cholecystectomy (1998); Cataract extraction w/PHACO (Left, 05/11/2018); Thoracotomy/lobectomy (Right, 05/16/2018); Video bronchoscopy (N/A, 05/16/2018); Cataract extraction w/PHACO (Right, 08/11/2018); Total knee revision (Left, 11/29/2018); Joint replacement (2009/2010); and Joint replacement (2020). Prior to Admission medications   Medication Sig Start Date End Date Taking? Authorizing Provider  aspirin 81 MG tablet Take 81 mg by mouth daily.    [provider]       [provider]  gabapentin (NEURONTIN) 100 MG capsule Take 1 capsule (100  mg total) by mouth 3 (three) times daily. 05/23/18    Nestor Lewandowsky, MD  gabapentin (NEURONTIN) 100 MG capsule Take 1 capsule (100 mg total) by mouth 3 (three) times daily. 05/31/18   Nestor Lewandowsky, MD  magnesium hydroxide (MILK OF MAGNESIA) 400 MG/5ML suspension Take 5 mLs by mouth daily as needed for mild constipation or moderate constipation.    [provider]  methocarbamol (ROBAXIN) 500 MG tablet Take 500 mg by mouth every 6 (six) hours as needed.  05/20/18   [provider]  oxyCODONE-acetaminophen (PERCOCET) 7.5-325 MG tablet Take 1 tablet by mouth every 4 (four) hours as needed for severe pain. 05/31/18   Nestor Lewandowsky, MD  Prednisolon-Gatiflox-Bromfenac 1-0.5-0.075 % SUSP Apply 1 drop to eye 2 (two) times daily. 05/20/18   Nestor Lewandowsky, MD  SPIRIVA HANDIHALER 18 MCG inhalation capsule INL CONTENTS OF 1 C ONCE DAILY USING HANDIHALER 05/20/18   [provider]  tiZANidine (ZANAFLEX) 4 MG capsule Take 2 mg by mouth at bedtime.     [provider]  traMADol (ULTRAM) 50 MG tablet TK 1-2 TS PO Q 6 H 05/20/18   [provider]  traMADol (ULTRAM) 50 MG tablet Take 1 tablet (50 mg total) by mouth 4 (four) times daily. 05/31/18 05/31/19  Nestor Lewandowsky, MD  traZODone (DESYREL) 50 MG tablet Take 25 mg by mouth at bedtime.     [provider]   ADVAIR 250/50 2 puffs twice daily  Allergies  Allergen Reactions  . Morphine And Related Itching    Can tolerate with benadryl  . Cymbalta [Duloxetine Hcl] Other (See Comments)    Pt states it keeps her awake  . Adhesive [Tape] Other (See Comments)    Plastic tape rips skin and bruises her. PLEASE USE PAPER TAPE  . Lexapro [Escitalopram Oxalate] Other (See Comments)    Keeps awake for days.       Review of Systems:  Gen:  Denies  fever, sweats, chills weigh loss  +fatigue HEENT: Denies blurred vision, double vision, ear pain, eye pain, hearing loss, nose bleeds, sore throat Cardiac:  No dizziness, chest pain or heaviness, chest tightness,edema, No JVD  Resp:   No cough, -sputum production, +shortness of breath,-wheezing, -hemoptysis,  Gi: Denies swallowing difficulty, stomach pain, nausea or vomiting, diarrhea, constipation, bowel incontinence Gu:  Denies bladder incontinence, burning urine Ext:   Denies Joint pain, stiffness or swelling Skin: Denies  skin rash, easy bruising or bleeding or hives Endoc:  Denies polyuria, polydipsia , polyphagia or weight change Psych:   Denies depression, insomnia or hallucinations  Other:  All other systems negative  Physical Examination:   GENERAL:NAD, no fevers, chills, no weakness no fatigue HEAD: Normocephalic, atraumatic.  EYES: PERLA, EOMI No scleral icterus.  MOUTH: Moist mucosal membrane.  EAR, NOSE, THROAT: Clear without exudates. No external lesions.  NECK: Supple. No thyromegaly.  No JVD.  PULMONARY: CTA B/L no wheezing, rhonchi, crackles CARDIOVASCULAR: S1 and S2. Regular rate and rhythm. No murmurs GASTROINTESTINAL: Soft, nontender, nondistended. Positive bowel sounds.  MUSCULOSKELETAL: No swelling, clubbing, or edema.  NEUROLOGIC: No gross focal neurological deficits. 5/5 strength all extremities SKIN: No ulceration, lesions, rashes, or cyanosis.  PSYCHIATRIC: Insight, judgment intact. -depression -anxiety ALL OTHER ROS ARE NEGATIVE         PET scan 02/28/2019 10 only reviewed by me today Findings reviewed with patient Hypermetabolic right hilar lymph node Suggest possible recurrence  ASSESSMENT / PLAN: 58 year old pleasant white female status post resection right lower lobe  lung mass consistent with squamous cell carcinoma with mild to moderate COPD in the setting of chronic hypoxic respiratory failure with follow-up CT chest and PET scan that shows hypermetabolic right hilar and infrahilar lymph node which suggest possible recurrence of cancer  Shortness of breath and dyspnea on exertion multifactorial related to COPD and obesity and deconditioned state  Mild to moderate  COPD Gold stage C Chronic shortness of breath and dyspnea exertion stable continue to use Advair and Spiriva as prescribed Albuterol nebulizer as needed  Chronic hypoxic respiratory failure using 2 L at nighttime She uses 1 to 2 L intermittently with exertion during the day Patient uses and benefits from oxygen therapy and needs this for survival\  Right hilar lymphadenopathy with a history of squamous cell carcinoma of the lung PET scan positive for hypermetabolic right hilar area Strong suggestion for recurrence  Obesity -recommend significant weight loss -recommend changing diet  Deconditioned state -Recommend increased daily activity and exercise    COVID-19 EDUCATION: The signs and symptoms of COVID-19 were discussed with the patient and how to seek care for testing.  The importance of social distancing was discussed today. Hand Washing Techniques and avoid touching face was advised.  MEDICATION ADJUSTMENTS/LABS AND TESTS ORDERED: EBUS for Monday June 1st ordered  CURRENT MEDICATIONS REVIEWED AT LENGTH WITH PATIENT TODAY   Patient are satisfied with Plan of action and management. All questions answered Follow up in 6 months   Kolten Ryback Patricia Pesa, M.D.  [facility]  Medical Director [place] Director [facility] Cardio-Pulmonary Department

## 2019-03-17 LAB — NOVEL CORONAVIRUS, NAA (HOSP ORDER, SEND-OUT TO REF LAB; TAT 18-24 HRS): SARS-CoV-2, NAA: NOT DETECTED

## 2019-03-18 ENCOUNTER — Encounter: Payer: Self-pay | Admitting: Anesthesiology

## 2019-03-19 ENCOUNTER — Other Ambulatory Visit: Payer: Self-pay | Admitting: Family Medicine

## 2019-03-19 NOTE — Telephone Encounter (Signed)
Requested medication (s) are due for refill today: yes  Requested medication (s) are on the active medication list: historical med  Last refill:  10/04/18  Future visit scheduled: no  Notes to clinic:  The source prescription was discontinued on 11/03/2018 by Ursula Alert, MD for the following reason: Non-compliance  Medication not delegated to NT to refill Requested Prescriptions  Pending Prescriptions Disp Refills   QUEtiapine (SEROQUEL) 100 MG tablet [Pharmacy Med Name: QUETIAPINE 100MG  TABLETS] 60 tablet 1    Sig: TAKE 2 TABLETS(200 MG) BY MOUTH AT BEDTIME     Not Delegated - Psychiatry:  Antipsychotics - Second Generation (Atypical) - quetiapine Failed - 03/19/2019  5:46 PM      Failed - This refill cannot be delegated      Passed - ALT in normal range and within 180 days    ALT  Date Value Ref Range Status  11/07/2018 13 0 - 44 U/L Final   SGPT (ALT)  Date Value Ref Range Status  05/14/2014 15 U/L Final    Comment:    14-63 NOTE: New Reference Range 05/08/14          Passed - AST in normal range and within 180 days    AST  Date Value Ref Range Status  11/07/2018 15 15 - 41 U/L Final   SGOT(AST)  Date Value Ref Range Status  05/14/2014 9 (L) 15 - 37 Unit/L Final         Passed - Last BP in normal range    BP Readings from Last 1 Encounters:  03/16/19 120/74         Passed - Valid encounter within last 6 months    Recent Outpatient Visits          1 week ago Foul smelling urine   [place], Megan P, DO   1 month ago Malignant neoplasm of lower lobe of right lung ([place])   [place], Megan P, DO   4 months ago COPD exacerbation ([place])   [place], [place], DO   4 months ago Left breast abscess   [place], [place], DO   5 months ago Left breast abscess   [facility] [place], [place]      Future Appointments            In 1 month Jonathon Bellows, MD [place]   In 1 month Flora Lipps, MD [facility] [place]           Passed - Completed PHQ-2 or PHQ-9 in the last 360 days.

## 2019-03-20 ENCOUNTER — Encounter: Payer: Self-pay | Admitting: *Deleted

## 2019-03-20 ENCOUNTER — Other Ambulatory Visit: Payer: Self-pay

## 2019-03-20 ENCOUNTER — Ambulatory Visit
Admission: RE | Admit: 2019-03-20 | Discharge: 2019-03-20 | Disposition: A | Discharge status: Home / Self Care | Payer: Medicare Other | Attending: Internal Medicine | Admitting: Internal Medicine

## 2019-03-20 ENCOUNTER — Ambulatory Visit: Payer: Medicare Other | Admitting: Anesthesiology

## 2019-03-20 ENCOUNTER — Telehealth: Payer: Self-pay

## 2019-03-20 ENCOUNTER — Encounter
Admission: RE | Disposition: A | Discharge status: Home / Self Care | Payer: Self-pay | Source: Home / Self Care | Attending: Internal Medicine

## 2019-03-20 DIAGNOSIS — Z96652 Presence of left artificial knee joint: Secondary | ICD-10-CM | POA: Diagnosis not present

## 2019-03-20 DIAGNOSIS — J9611 Chronic respiratory failure with hypoxia: Secondary | ICD-10-CM | POA: Diagnosis not present

## 2019-03-20 DIAGNOSIS — J449 Chronic obstructive pulmonary disease, unspecified: Secondary | ICD-10-CM | POA: Insufficient documentation

## 2019-03-20 DIAGNOSIS — Z85118 Personal history of other malignant neoplasm of bronchus and lung: Secondary | ICD-10-CM | POA: Insufficient documentation

## 2019-03-20 DIAGNOSIS — Z8521 Personal history of malignant neoplasm of larynx: Secondary | ICD-10-CM | POA: Diagnosis not present

## 2019-03-20 DIAGNOSIS — Z902 Acquired absence of lung [part of]: Secondary | ICD-10-CM | POA: Diagnosis not present

## 2019-03-20 DIAGNOSIS — Z79899 Other long term (current) drug therapy: Secondary | ICD-10-CM | POA: Diagnosis not present

## 2019-03-20 DIAGNOSIS — R590 Localized enlarged lymph nodes: Secondary | ICD-10-CM | POA: Diagnosis not present

## 2019-03-20 DIAGNOSIS — M79606 Pain in leg, unspecified: Secondary | ICD-10-CM | POA: Diagnosis not present

## 2019-03-20 DIAGNOSIS — Z7982 Long term (current) use of aspirin: Secondary | ICD-10-CM | POA: Insufficient documentation

## 2019-03-20 DIAGNOSIS — Z87891 Personal history of nicotine dependence: Secondary | ICD-10-CM | POA: Insufficient documentation

## 2019-03-20 DIAGNOSIS — E669 Obesity, unspecified: Secondary | ICD-10-CM | POA: Diagnosis not present

## 2019-03-20 DIAGNOSIS — R591 Generalized enlarged lymph nodes: Secondary | ICD-10-CM | POA: Diagnosis not present

## 2019-03-20 DIAGNOSIS — Z79891 Long term (current) use of opiate analgesic: Secondary | ICD-10-CM | POA: Diagnosis not present

## 2019-03-20 DIAGNOSIS — C771 Secondary and unspecified malignant neoplasm of intrathoracic lymph nodes: Secondary | ICD-10-CM | POA: Insufficient documentation

## 2019-03-20 DIAGNOSIS — Z9221 Personal history of antineoplastic chemotherapy: Secondary | ICD-10-CM | POA: Diagnosis not present

## 2019-03-20 DIAGNOSIS — G8929 Other chronic pain: Secondary | ICD-10-CM | POA: Diagnosis not present

## 2019-03-20 DIAGNOSIS — Z923 Personal history of irradiation: Secondary | ICD-10-CM | POA: Insufficient documentation

## 2019-03-20 DIAGNOSIS — Z6836 Body mass index (BMI) 36.0-36.9, adult: Secondary | ICD-10-CM | POA: Diagnosis not present

## 2019-03-20 DIAGNOSIS — R599 Enlarged lymph nodes, unspecified: Secondary | ICD-10-CM

## 2019-03-20 DIAGNOSIS — K219 Gastro-esophageal reflux disease without esophagitis: Secondary | ICD-10-CM | POA: Diagnosis not present

## 2019-03-20 DIAGNOSIS — Z9981 Dependence on supplemental oxygen: Secondary | ICD-10-CM | POA: Diagnosis not present

## 2019-03-20 DIAGNOSIS — C779 Secondary and unspecified malignant neoplasm of lymph node, unspecified: Secondary | ICD-10-CM | POA: Diagnosis not present

## 2019-03-20 HISTORY — PX: ENDOBRONCHIAL ULTRASOUND: SHX5096

## 2019-03-20 SURGERY — ENDOBRONCHIAL ULTRASOUND (EBUS)
Anesthesia: General

## 2019-03-20 MED ORDER — FENTANYL CITRATE (PF) 100 MCG/2ML IJ SOLN
INTRAMUSCULAR | Status: AC
  Filled 2019-03-20: qty 2

## 2019-03-20 MED ORDER — LIDOCAINE HCL 2 % EX GEL
1.0000 "application " | Freq: Once | CUTANEOUS | Status: DC
  Filled 2019-03-20: qty 4250

## 2019-03-20 MED ORDER — MIDAZOLAM HCL 2 MG/2ML IJ SOLN
INTRAMUSCULAR | Status: AC
  Filled 2019-03-20: qty 2

## 2019-03-20 MED ORDER — LIDOCAINE HCL (PF) 2 % IJ SOLN
INTRAMUSCULAR | Status: AC
  Filled 2019-03-20: qty 10

## 2019-03-20 MED ORDER — SUCCINYLCHOLINE CHLORIDE 20 MG/ML IJ SOLN
INTRAMUSCULAR | Status: AC
  Filled 2019-03-20: qty 1

## 2019-03-20 MED ORDER — LIDOCAINE HCL (CARDIAC) PF 100 MG/5ML IV SOSY
PREFILLED_SYRINGE | INTRAVENOUS | Status: DC | PRN
  Administered 2019-03-20: 60 mg via INTRAVENOUS

## 2019-03-20 MED ORDER — ROCURONIUM BROMIDE 100 MG/10ML IV SOLN
INTRAVENOUS | Status: DC | PRN
  Administered 2019-03-20: 5 mg via INTRAVENOUS
  Administered 2019-03-20: 25 mg via INTRAVENOUS

## 2019-03-20 MED ORDER — SUGAMMADEX SODIUM 200 MG/2ML IV SOLN
INTRAVENOUS | Status: DC | PRN
  Administered 2019-03-20: 200 mg via INTRAVENOUS

## 2019-03-20 MED ORDER — DEXAMETHASONE SODIUM PHOSPHATE 10 MG/ML IJ SOLN
INTRAMUSCULAR | Status: DC | PRN
  Administered 2019-03-20: 4 mg via INTRAVENOUS

## 2019-03-20 MED ORDER — FENTANYL CITRATE (PF) 100 MCG/2ML IJ SOLN
INTRAMUSCULAR | Status: DC | PRN
  Administered 2019-03-20 (×2): 50 ug via INTRAVENOUS

## 2019-03-20 MED ORDER — SEVOFLURANE IN SOLN
RESPIRATORY_TRACT | Status: AC
  Filled 2019-03-20: qty 250

## 2019-03-20 MED ORDER — PHENYLEPHRINE HCL 0.25 % NA SOLN
1.0000 | Freq: Four times a day (QID) | NASAL | Status: DC | PRN
  Filled 2019-03-20: qty 15

## 2019-03-20 MED ORDER — FENTANYL CITRATE (PF) 100 MCG/2ML IJ SOLN
25.0000 ug | INTRAMUSCULAR | Status: DC | PRN
  Administered 2019-03-20 (×2): 50 ug via INTRAVENOUS

## 2019-03-20 MED ORDER — PROPOFOL 10 MG/ML IV BOLUS
INTRAVENOUS | Status: DC | PRN
  Administered 2019-03-20: 130 mg via INTRAVENOUS

## 2019-03-20 MED ORDER — SUCCINYLCHOLINE CHLORIDE 20 MG/ML IJ SOLN
INTRAMUSCULAR | Status: DC | PRN
  Administered 2019-03-20: 100 mg via INTRAVENOUS

## 2019-03-20 MED ORDER — LACTATED RINGERS IV SOLN
INTRAVENOUS | Status: DC
  Administered 2019-03-20: 12:00:00 via INTRAVENOUS

## 2019-03-20 MED ORDER — PROPOFOL 10 MG/ML IV BOLUS
INTRAVENOUS | Status: AC
  Filled 2019-03-20: qty 20

## 2019-03-20 MED ORDER — ONDANSETRON HCL 4 MG/2ML IJ SOLN
INTRAMUSCULAR | Status: AC
  Filled 2019-03-20: qty 2

## 2019-03-20 MED ORDER — MIDAZOLAM HCL 5 MG/5ML IJ SOLN
INTRAMUSCULAR | Status: DC | PRN
  Administered 2019-03-20: 2 mg via INTRAVENOUS

## 2019-03-20 MED ORDER — ONDANSETRON HCL 4 MG/2ML IJ SOLN
INTRAMUSCULAR | Status: DC | PRN
  Administered 2019-03-20: 4 mg via INTRAVENOUS

## 2019-03-20 MED ORDER — PHENYLEPHRINE HCL (PRESSORS) 10 MG/ML IV SOLN
INTRAVENOUS | Status: DC | PRN
  Administered 2019-03-20: 100 ug via INTRAVENOUS

## 2019-03-20 MED ORDER — ONDANSETRON HCL 4 MG/2ML IJ SOLN: 4.0000 mg | Freq: Once | INTRAMUSCULAR | Status: DC | PRN

## 2019-03-20 MED ORDER — ROCURONIUM BROMIDE 50 MG/5ML IV SOLN
INTRAVENOUS | Status: AC
  Filled 2019-03-20: qty 1

## 2019-03-20 NOTE — Interval H&P Note (Signed)
History and Physical Interval Note:  03/20/2019 1:11 PM  LIVIANNA PETRAGLIA  has presented today for surgery, with the diagnosis of HISTORY OF SQUAMOUS CELL CANCER [place].  The various methods of treatment have been discussed with the patient and family. After consideration of risks, benefits and other options for treatment, the patient has consented to  Procedure(s): ENDOBRONCHIAL ULTRASOUND (N/A) as a surgical intervention.  The patient's history has been reviewed, patient examined, no change in status, stable for surgery.  I have reviewed the patient's chart and labs.  Questions were answered to the patient's satisfaction.       The Risks and Benefits of the Bronchoscopy procedure with EBUS were explained to patient/family.  I have discussed the risk for Acute Bleeding, increased chance of Infection, increased chance of Respiratory Failure and Cardiac Arrest, increased chance of pneumothorax and collapsed lung, as well as increased Stroke and Death.  I have also explained to avoid all types of NSAIDs 1 week prior to procedure date  to decrease chance of bleeding, and also to avoid food and drinks the midnight prior to procedure.  The procedure consists of a video camera with a light source to be placed and inserted  into the lungs to  look for abnormal tissue and to obtain tissue samples by using needle and biopsy tools.  The patient/family understand the risks and benefits and have agreed to proceed with procedure.    Flora Lipps

## 2019-03-20 NOTE — Telephone Encounter (Signed)
Called pt and advised she should reach out to physiatrist per Dr Wynetta Emery

## 2019-03-20 NOTE — Telephone Encounter (Signed)
Appt Scheduled for Thursday 03/23/2019

## 2019-03-20 NOTE — Transfer of Care (Signed)
Immediate Anesthesia Transfer of Care Note  Patient: Susan Houston  Procedure(s) Performed: ENDOBRONCHIAL ULTRASOUND (N/A )  Patient Location: PACU  Anesthesia Type:General  Level of Consciousness: sedated  Airway & Oxygen Therapy: Patient Spontanous Breathing and Patient connected to face mask oxygen  Post-op Assessment: Report given to RN and Post -op Vital signs reviewed and stable  Post vital signs: Reviewed and stable  Last Vitals:  Vitals Value Taken Time  BP 151/70 03/20/2019  1:55 PM  Temp 35.9 C 03/20/2019  1:55 PM  Pulse 66 03/20/2019  1:58 PM  Resp 15 03/20/2019  1:58 PM  SpO2 100 % 03/20/2019  1:58 PM  Vitals shown include unvalidated device data.  Last Pain:  Vitals:   03/20/19 1202  TempSrc: Oral  PainSc: 7       Patients Stated Pain Goal: 3 (84/83/50 7573)  Complications: No apparent anesthesia complications

## 2019-03-20 NOTE — Anesthesia Preprocedure Evaluation (Signed)
Anesthesia Evaluation  Patient identified by MRN, date of birth, ID band Patient awake    Reviewed: Allergy & Precautions, NPO status , Patient's Chart, lab work & pertinent test results, reviewed documented beta blocker date and time   Airway Mallampati: II  TM Distance: >3 FB     Dental  (+) Chipped   Pulmonary shortness of breath, asthma , COPD, former smoker,           Cardiovascular + dysrhythmias      Neuro/Psych  Headaches, PSYCHIATRIC DISORDERS Anxiety Depression  Neuromuscular disease    GI/Hepatic GERD  Controlled,  Endo/Other    Renal/GU      Musculoskeletal  (+) Arthritis ,   Abdominal   Peds  Hematology  (+) anemia ,   Anesthesia Other Findings   Reproductive/Obstetrics                             Anesthesia Physical Anesthesia Plan  ASA: III  Anesthesia Plan: General   Post-op Pain Management:    Induction: Intravenous  PONV Risk Score and Plan:   Airway Management Planned: Oral ETT  Additional Equipment:   Intra-op Plan:   Post-operative Plan:   Informed Consent: I have reviewed the patients History and Physical, chart, labs and discussed the procedure including the risks, benefits and alternatives for the proposed anesthesia with the patient or authorized representative who has indicated his/her understanding and acceptance.       Plan Discussed with: CRNA  Anesthesia Plan Comments:         Anesthesia Quick Evaluation

## 2019-03-20 NOTE — Anesthesia Procedure Notes (Signed)
Procedure Name: Intubation Date/Time: 03/20/2019 1:14 PM Performed by: Dionne Bucy, CRNA Pre-anesthesia Checklist: Patient identified, Patient being monitored, Timeout performed, Emergency Drugs available and Suction available Patient Re-evaluated:Patient Re-evaluated prior to induction Oxygen Delivery Method: Circle system utilized Preoxygenation: Pre-oxygenation with 100% oxygen Induction Type: IV induction Ventilation: Mask ventilation without difficulty Laryngoscope Size: 3 and McGraph Grade View: Grade I Tube type: Oral Tube size: 8.5 mm Number of attempts: 1 Airway Equipment and Method: Stylet Placement Confirmation: ETT inserted through vocal cords under direct vision,  positive ETCO2 and breath sounds checked- equal and bilateral Secured at: 20 cm Tube secured with: Tape Dental Injury: Teeth and Oropharynx as per pre-operative assessment

## 2019-03-20 NOTE — Anesthesia Postprocedure Evaluation (Signed)
Anesthesia Post Note  Patient: Susan Houston  Procedure(s) Performed: ENDOBRONCHIAL ULTRASOUND (N/A )  Patient location during evaluation: PACU Anesthesia Type: General Level of consciousness: awake and alert Pain management: pain level controlled Vital Signs Assessment: post-procedure vital signs reviewed and stable Respiratory status: spontaneous breathing, nonlabored ventilation, respiratory function stable and patient connected to nasal cannula oxygen Cardiovascular status: blood pressure returned to baseline and stable Postop Assessment: no apparent nausea or vomiting Anesthetic complications: no     Last Vitals:  Vitals:   03/20/19 1202 03/20/19 1355  BP: 113/85 (!) 151/70  Pulse: 73 68  Resp: 16 12  Temp: 36.7 C (!) 35.9 C  SpO2: 100% 100%    Last Pain:  Vitals:   03/20/19 1355  TempSrc:   PainSc: [name]

## 2019-03-20 NOTE — Anesthesia Post-op Follow-up Note (Signed)
Anesthesia QCDR form completed.        

## 2019-03-20 NOTE — Telephone Encounter (Signed)
What medication is she talking about? I am already giving her psychotropics? Please ask her . thanks

## 2019-03-20 NOTE — Telephone Encounter (Signed)
pt called states that her doctor will not refill her psych medications because she is seeing you now . states that you have to take over the medication. can you please refill her medications.

## 2019-03-20 NOTE — Discharge Instructions (Signed)

## 2019-03-20 NOTE — Op Note (Signed)
PROCEDURE: ENDOBRONCHIAL ULTRASOUND   PROCEDURE DATE: 03/20/2019  TIME:  NAME:  Susan Houston  DOB:[birth year]  MRN: [id number] LOC:  ARPO/None     Code Status History    Date Active Date Inactive Code Status Order ID Comments User Context   11/29/2018 1108 12/02/2018 1743 Full Code [id number]  Hessie Knows, MD Inpatient   05/16/2018 1126 05/20/2018 1644 Full Code [id number]  Nestor Lewandowsky, MD Inpatient   09/24/2014 1653 09/25/2014 2132 Full Code [id number]  Pricilla Loveless, PA-C Inpatient          Indications/Preliminary Diagnosis:Adenopathy  Consent: (Place X beside choice/s below)  The benefits, risks and possible complications of the procedure were        explained to:  __x_ patient  ___ patient's family  ___ other:___________  who verbalized understanding and gave:  ___ verbal  ___ written  _x__ verbal and written  ___ telephone  ___ other:________ consent.      Unable to obtain consent; procedure performed on emergent basis.     Other:       PRESEDATION ASSESSMENT: History and Physical has been performed. Patient meds and allergies have been reviewed. Presedation airway examination has been performed and documented. Baseline vital signs, sedation score, oxygenation status, and cardiac rhythm were reviewed. Patient was deemed to be in satisfactory condition to undergo the procedure.    PREMEDICATIONS: SEE ANESTHESIOLOGY RECORDS   Insertion Route (Place X beside choice below)   Nasal   Oral  x Endotracheal Tube   Tracheostomy   INTRAPROCEDURE MEDICATIONS: SEE ANESTHESIOLOGY RECORDS   PROCEDURE DETAILS: Timeout performed and correct patient, name, & ID confirmed. Following prep per Pulmonary policy, appropriate sedation was administered.  I proceeded with introducing the endobronchial [place] scope and findings, technical procedures, and specimen collection as noted below. At the end of exam the scope was withdrawn without incident. Impression and Plan as noted below. THE EBUS  WAS DRIVEN TO THE RT HILAR REGION   SPECIMENS (Sites): (Place X beside choice below)  Specimens Description   No Specimens Obtained     Washings    Lavage    Biopsies    Fine Needle Aspirates 7 samples taken with 21 gauge needle   Brushings    Sputum    FINDINGS: RT HILAR ADENOPATHY   ESTIMATED BLOOD LOSS: none COMPLICATIONS/RESOLUTION: none       IMPRESSION:POST-PROCEDURE DX: ADENOPATHY    RECOMMENDATION/PLAN:  Follow up Pathology Reports     Corrin Parker, M.D.  [facility]  Medical Director [place] Director [place] Department

## 2019-03-21 ENCOUNTER — Encounter: Payer: Self-pay | Admitting: Internal Medicine

## 2019-03-21 ENCOUNTER — Telehealth: Payer: Self-pay | Admitting: Internal Medicine

## 2019-03-21 MED ORDER — PREDNISONE 20 MG PO TABS: 20.0000 mg | ORAL_TABLET | Freq: Every day | ORAL | 0 refills | Status: DC

## 2019-03-21 NOTE — Telephone Encounter (Signed)
Please prescribe prednisone 20 mh daily for 5 days Cause-inflammation

## 2019-03-21 NOTE — Telephone Encounter (Signed)
Spoke to pt.  Pt stated that after her bronch yesterday, she has coughed up bright red blood the size of a nickel 10 times.  She also reports of chest discomfort during cough spell.  Denied chills, sweats or increased sob.   DK please advise. Thanks

## 2019-03-21 NOTE — Telephone Encounter (Signed)
Rx for prednisone 20mg  has been sent to preferred pharmacy. Pt is aware and voiced her understanding. Nothing further is needed.

## 2019-03-22 NOTE — Telephone Encounter (Signed)
ask if you was going to refill her seroquel for her.

## 2019-03-22 NOTE — Telephone Encounter (Signed)
Spoke to patient - reports she was taking seroquel per PMD. But I do not see it in the most recent notes. Discussed that she should not be on Belsomra and seroquel. She is OK and will follow up in couple of week.

## 2019-03-23 ENCOUNTER — Ambulatory Visit: Payer: Medicare Other | Admitting: Family Medicine

## 2019-03-23 LAB — CYTOLOGY - NON PAP

## 2019-03-24 ENCOUNTER — Encounter: Payer: Self-pay | Admitting: Internal Medicine

## 2019-03-24 ENCOUNTER — Inpatient Hospital Stay: Payer: Medicare Other | Attending: Internal Medicine

## 2019-03-24 ENCOUNTER — Inpatient Hospital Stay (HOSPITAL_BASED_OUTPATIENT_CLINIC_OR_DEPARTMENT_OTHER): Payer: Medicare Other | Admitting: Internal Medicine

## 2019-03-24 ENCOUNTER — Other Ambulatory Visit: Payer: Self-pay

## 2019-03-24 VITALS — BP 151/80 | HR 59 | Temp 98.0°F | Resp 20 | Ht 62.0 in | Wt 199.0 lb

## 2019-03-24 DIAGNOSIS — Z5111 Encounter for antineoplastic chemotherapy: Secondary | ICD-10-CM | POA: Insufficient documentation

## 2019-03-24 DIAGNOSIS — Z7982 Long term (current) use of aspirin: Secondary | ICD-10-CM | POA: Diagnosis not present

## 2019-03-24 DIAGNOSIS — R251 Tremor, unspecified: Secondary | ICD-10-CM | POA: Diagnosis not present

## 2019-03-24 DIAGNOSIS — R0781 Pleurodynia: Secondary | ICD-10-CM | POA: Diagnosis not present

## 2019-03-24 DIAGNOSIS — F419 Anxiety disorder, unspecified: Secondary | ICD-10-CM | POA: Diagnosis not present

## 2019-03-24 DIAGNOSIS — J449 Chronic obstructive pulmonary disease, unspecified: Secondary | ICD-10-CM

## 2019-03-24 DIAGNOSIS — Z8521 Personal history of malignant neoplasm of larynx: Secondary | ICD-10-CM | POA: Diagnosis not present

## 2019-03-24 DIAGNOSIS — M79606 Pain in leg, unspecified: Secondary | ICD-10-CM | POA: Insufficient documentation

## 2019-03-24 DIAGNOSIS — J029 Acute pharyngitis, unspecified: Secondary | ICD-10-CM | POA: Diagnosis not present

## 2019-03-24 DIAGNOSIS — Z515 Encounter for palliative care: Secondary | ICD-10-CM | POA: Insufficient documentation

## 2019-03-24 DIAGNOSIS — K219 Gastro-esophageal reflux disease without esophagitis: Secondary | ICD-10-CM | POA: Insufficient documentation

## 2019-03-24 DIAGNOSIS — E785 Hyperlipidemia, unspecified: Secondary | ICD-10-CM | POA: Diagnosis not present

## 2019-03-24 DIAGNOSIS — G4700 Insomnia, unspecified: Secondary | ICD-10-CM

## 2019-03-24 DIAGNOSIS — R0789 Other chest pain: Secondary | ICD-10-CM | POA: Diagnosis not present

## 2019-03-24 DIAGNOSIS — F329 Major depressive disorder, single episode, unspecified: Secondary | ICD-10-CM | POA: Insufficient documentation

## 2019-03-24 DIAGNOSIS — D649 Anemia, unspecified: Secondary | ICD-10-CM | POA: Insufficient documentation

## 2019-03-24 DIAGNOSIS — Z87891 Personal history of nicotine dependence: Secondary | ICD-10-CM | POA: Diagnosis not present

## 2019-03-24 DIAGNOSIS — M545 Low back pain: Secondary | ICD-10-CM

## 2019-03-24 DIAGNOSIS — R296 Repeated falls: Secondary | ICD-10-CM | POA: Diagnosis not present

## 2019-03-24 DIAGNOSIS — G3184 Mild cognitive impairment, so stated: Secondary | ICD-10-CM | POA: Diagnosis not present

## 2019-03-24 DIAGNOSIS — Z79899 Other long term (current) drug therapy: Secondary | ICD-10-CM

## 2019-03-24 DIAGNOSIS — C3431 Malignant neoplasm of lower lobe, right bronchus or lung: Secondary | ICD-10-CM

## 2019-03-24 LAB — CBC WITH DIFFERENTIAL/PLATELET
Abs Immature Granulocytes: 0.04 10*3/uL (ref 0.00–0.07)
Basophils Absolute: 0.1 10*3/uL (ref 0.0–0.1)
Basophils Relative: 1 %
Eosinophils Absolute: 0 10*3/uL (ref 0.0–0.5)
Eosinophils Relative: 0 %
HCT: 36.1 % (ref 36.0–46.0)
Hemoglobin: 11.3 g/dL — ABNORMAL LOW (ref 12.0–15.0)
Immature Granulocytes: 0 %
Lymphocytes Relative: 21 %
Lymphs Abs: 1.9 10*3/uL (ref 0.7–4.0)
MCH: 25.4 pg — ABNORMAL LOW (ref 26.0–34.0)
MCHC: 31.3 g/dL (ref 30.0–36.0)
MCV: 81.1 fL (ref 80.0–100.0)
Monocytes Absolute: 0.5 10*3/uL (ref 0.1–1.0)
Monocytes Relative: 6 %
Neutro Abs: 6.6 10*3/uL (ref 1.7–7.7)
Neutrophils Relative %: 72 %
Platelets: 214 10*3/uL (ref 150–400)
RBC: 4.45 MIL/uL (ref 3.87–5.11)
RDW: 13.8 % (ref 11.5–15.5)
WBC: 9.1 10*3/uL (ref 4.0–10.5)
nRBC: 0 % (ref 0.0–0.2)

## 2019-03-24 LAB — COMPREHENSIVE METABOLIC PANEL
ALT: 9 U/L (ref 0–44)
AST: 12 U/L — ABNORMAL LOW (ref 15–41)
Albumin: 3.8 g/dL (ref 3.5–5.0)
Alkaline Phosphatase: 72 U/L (ref 38–126)
Anion gap: 7 (ref 5–15)
BUN: 17 mg/dL (ref 6–20)
CO2: 29 mmol/L (ref 22–32)
Calcium: 9.3 mg/dL (ref 8.9–10.3)
Chloride: 104 mmol/L (ref 98–111)
Creatinine, Ser: 0.85 mg/dL (ref 0.44–1.00)
GFR calc Af Amer: >60 mL/min (ref >60)
GFR calc non Af Amer: >60 mL/min (ref >60)
Glucose, Bld: 97 mg/dL (ref 70–99)
Potassium: 3.9 mmol/L (ref 3.5–5.1)
Sodium: 140 mmol/L (ref 135–145)
Total Bilirubin: 0.6 mg/dL (ref 0.3–1.2)
Total Protein: 7.3 g/dL (ref 6.5–8.1)

## 2019-03-24 MED ORDER — TRAMADOL HCL 50 MG PO TABS: 50.0000 mg | ORAL_TABLET | Freq: Four times a day (QID) | ORAL | 0 refills | Status: DC | PRN

## 2019-03-24 NOTE — Progress Notes (Signed)
+[place] OFFICE PROGRESS NOTE  Patient Care Team: Valerie Roys, DO as PCP - General (Family Medicine) Anabel Bene, MD as Referring Physician (Neurology) Telford Nab, RN as Registered Nurse  Cancer Staging No matching staging information was found for the patient.   Oncology History   # 2011-laryngeal cancer/SCC; Stage II; April-June 2011 s/p  CRT [Dr.Choski/Bennett]  # 2019- likely STAGE I 10 mm RLL nodule [incidental]; s/p wedge resection-July 2019-stage I squamous cell lung cancer. NO  Adjuvant therapy.  # May 28th 2020-stage III lung cancer-recurrent  RIGHT HILAR-- POSITIVE FOR MALIGNANT NEOPLASTIC CELLS. - CONSISTENT WITH METASTATIC SQUAMOUS CELL CARCINOMA.  #June 2020- Carbo-taxol- with RT  # COPD  # post thoracotomy syndrome  DIAGNOSIS: RLL lung ca  STAGE:  III ; GOALS: cure  CURRENT/MOST RECENT THERAPY: Carbo-taxol- RT planned     Laryngeal cancer ([place])    Primary cancer of right lower lobe of lung ([place])   05/27/2018 Initial Diagnosis    Primary cancer of right lower lobe of lung ([place])    04/10/2019 -  Chemotherapy    The patient had palonosetron (ALOXI) injection 0.25 mg, 0.25 mg, Intravenous,  Once, 0 of 7 cycles CARBOplatin (PARAPLATIN) in sodium chloride 0.9 % 100 mL chemo infusion, , Intravenous,  Once, 0 of 7 cycles PACLitaxel (TAXOL) 90 mg in sodium chloride 0.9 % 250 mL chemo infusion (</= 80mg /m2), 45 mg/m2, Intravenous,  Once, 0 of 7 cycles  for chemotherapy treatment.      INTERVAL HISTORY:  Susan Houston 58 y.o.  female pleasant patient with a history of stage I squamous cell lung cancer; status post right lung wedge resection.  Patient had concerns of recurrence in the hilar region on follow-up PET scan.  Patient underwent bronchoscopy and biopsy.  She is here to review the path in the next plan of care.  Patient continues to complain of pain in her left posterior lower thoracic region.  Patient is on tramadol taking 3  times a day.  Denies any aggravating relieving factors.  Review of Systems  Constitutional: Positive for malaise/fatigue. Negative for chills, diaphoresis, fever and weight loss.  HENT: Negative for nosebleeds and sore throat.   Eyes: Negative for double vision.  Respiratory: Positive for cough and shortness of breath. Negative for hemoptysis, sputum production and wheezing.   Cardiovascular: Positive for chest pain. Negative for palpitations, orthopnea and leg swelling.  Gastrointestinal: Positive for constipation and diarrhea. Negative for abdominal pain, blood in stool, heartburn, melena, nausea and vomiting.  Genitourinary: Negative for dysuria, frequency and urgency.  Musculoskeletal: Negative for back pain and joint pain.  Skin: Negative.  Negative for itching and rash.  Neurological: Negative for dizziness, tingling, focal weakness, weakness and headaches.  Endo/Heme/Allergies: Does not bruise/bleed easily.  Psychiatric/Behavioral: Negative for depression. The patient is nervous/anxious and has insomnia.       PAST MEDICAL HISTORY :  Past Medical History:  Diagnosis Date  . Anemia    vitamin b12 deficiency. no longer taking supplements  . Anxiety   . Arthritis   . Asthma   . Back pain   . Bruises easily   . Chronic leg pain    BILATERAL  . COPD (chronic obstructive pulmonary disease) (HCC)    no inhalers for over 1 year  . Depression   . Difficulty sleeping   . Diverticulitis   . Dyspnea    due to having part of lung removed in 2019  . Dysrhythmia 04/2018  brady episodes. cleared by dr. Clayborn Bigness  . Facet syndrome, lumbar 03/28/2015  . GERD (gastroesophageal reflux disease)   . Hemorrhoids   . History of kidney stones 04/2018   recently passed stone  . Hyperlipidemia   . Incontinence of urine   . Laryngeal cancer ([place]) 2009   [facility] CANCER - CHEMO / RADIATION (NO SURGERY)   . Migraine   . Migraine without aura and without status migrainosus, not intractable  09/11/2016  . Mild cognitive impairment 11/23/2017  . MRSA infection greater than 3 months ago    2008 right side of face  . Nerve damage    right leg. thinks this is from her TKR  . Nodule of lower lobe of right lung 04/22/2018  . Oxygen deficiency    2L/HS  . Primary cancer of right lower lobe of lung ([place]) 04/18/2018   Surgical resection of RLL  . Wears dentures    full upper and lower    PAST SURGICAL HISTORY :   Past Surgical History:  Procedure Laterality Date  . ABDOMINAL HYSTERECTOMY  1983  . BLADDER SUSPENSION  2014  . CATARACT EXTRACTION W/PHACO Left 05/11/2018   Procedure: CATARACT EXTRACTION PHACO AND INTRAOCULAR LENS PLACEMENT ([place]) LEFT;  Surgeon: Leandrew Koyanagi, MD;  Location: [facility];  Service: Ophthalmology;  Laterality: Left;  PREFERS EARLY  . CATARACT EXTRACTION W/PHACO Right 08/11/2018   Procedure: CATARACT EXTRACTION PHACO AND INTRAOCULAR LENS PLACEMENT ([facility]);  Surgeon: Leandrew Koyanagi, MD;  Location: [facility];  Service: Ophthalmology;  Laterality: Right;  CDE 5:02 Total Time 01:35 Fluid lot # [id number]  . CHOLECYSTECTOMY  1998  . COLONOSCOPY    . ENDOBRONCHIAL ULTRASOUND N/A 03/20/2019   Procedure: ENDOBRONCHIAL ULTRASOUND;  Surgeon: Flora Lipps, MD;  Location: [facility];  Service: Cardiopulmonary;  Laterality: N/A;  . ESOPHAGOGASTRODUODENOSCOPY  2015  . ESOPHAGOGASTRODUODENOSCOPY (EGD) WITH PROPOFOL N/A 11/20/2016   Procedure: ESOPHAGOGASTRODUODENOSCOPY (EGD) WITH PROPOFOL;  Surgeon: Jonathon Bellows, MD;  Location: [facility];  Service: Endoscopy;  Laterality: N/A;  . EXCISION NEUROMA Bilateral 09/24/2014   Procedure: BILATERAL OPEN SAPHENOUS NEURECTOMIES AND OPEN LEFT PERIPATELLA OSETOPHYTECTOMY;  Surgeon: Mauri Pole, MD;  Location: [facility];  Service: Orthopedics;  Laterality: Bilateral;  . EYE SURGERY Left 05/11/2018   cataract extraction  . JOINT REPLACEMENT  2009/2010   BIL TOTAL KNEES  . JOINT REPLACEMENT  2020   left  .  THORACOTOMY/LOBECTOMY Right 05/16/2018   Procedure: THORACOTOMY/POSSIBLE LOBECTOMY;  Surgeon: Nestor Lewandowsky, MD;  Location: [facility];  Service: Thoracic;  Laterality: Right;  . TONSILLECTOMY    . TOTAL KNEE REVISION Left 11/29/2018   Procedure: Left TOTAL KNEE REVISION;  Surgeon: Hessie Knows, MD;  Location: [facility];  Service: Orthopedics;  Laterality: Left;  Marland Kitchen VIDEO BRONCHOSCOPY N/A 05/16/2018   Procedure: PREOP  BRONCHOSCOPY;  Surgeon: Nestor Lewandowsky, MD;  Location: [facility];  Service: Thoracic;  Laterality: N/A;    FAMILY HISTORY :   Family History  Problem Relation Age of Onset  . Asthma Mother   . Diabetes Mother   . Hyperlipidemia Mother   . Hypertension Mother   . Cirrhosis Mother        Non-alcoholic  . Arthritis Father   . Cancer Father        Bone  . Hyperlipidemia Father   . Hypertension Father   . Anxiety disorder Sister   . Depression Sister   . Kidney Stones Son   . Stroke Maternal Grandmother   . Breast cancer Maternal Grandmother 77  .  Cancer Maternal Grandfather        lung  . Pneumonia Paternal Grandmother   . Alzheimer's disease Paternal Grandfather   . Diabetes Sister   . Gout Sister   . Hypertension Sister   . Alcohol abuse Brother   . Heart disease Brother        massive MI    SOCIAL HISTORY:   Social History   Tobacco Use  . Smoking status: Former Smoker    Packs/day: 0.50    Types: Cigarettes    Last attempt to quit: 04/07/2016    Years since quitting: 2.9  . Smokeless tobacco: Never Used  . Tobacco comment: patient has not smoked x 10 days.06/28/17 chantix  Substance Use Topics  . Alcohol use: No    Alcohol/week: 0.0 standard drinks  . Drug use: No    ALLERGIES:  is allergic to morphine and related; cymbalta [duloxetine hcl]; adhesive [tape]; and lexapro [escitalopram oxalate].  MEDICATIONS:  Current Outpatient Medications  Medication Sig Dispense Refill  . albuterol (PROVENTIL HFA;VENTOLIN HFA) 108 (90 Base) MCG/ACT inhaler Inhale 2  puffs into the lungs every 4 (four) hours as needed for wheezing or shortness of breath. 1 Inhaler 2  . albuterol (PROVENTIL) (2.5 MG/3ML) 0.083% nebulizer solution Take 3 mLs (2.5 mg total) by nebulization every 4 (four) hours as needed for wheezing or shortness of breath. 75 mL 12  . aspirin EC 81 MG tablet Take 81 mg by mouth daily.    Marland Kitchen aspirin-acetaminophen-caffeine (EXCEDRIN MIGRAINE) 250-250-65 MG tablet Take 1 tablet by mouth daily as needed for headache.    . clonazePAM (KLONOPIN) 1 MG tablet Take 1 mg by mouth at bedtime.    Marland Kitchen estradiol (ESTRACE) 1 MG tablet Take 1 mg by mouth daily.     Marland Kitchen FLUoxetine (PROZAC) 40 MG capsule Take 1 capsule (40 mg total) by mouth daily. (Patient taking differently: Take 40 mg by mouth every morning. ) 90 capsule 0  . guaiFENesin-codeine 100-10 MG/5ML syrup Take 10 mLs by mouth 3 (three) times daily as needed for cough. 120 mL 0  . hydrOXYzine (VISTARIL) 25 MG capsule TAKE 1 CAPSULE(25 MG) BY MOUTH TWICE DAILY AS NEEDED FOR SEVERE ANXIETY OR SYMPTOMS (Patient taking differently: Take 25 mg by mouth 2 (two) times a day. ) 60 capsule 1  . nystatin cream (MYCOSTATIN) Apply 1 application topically 2 (two) times daily. (Patient taking differently: Apply 1 application topically 2 (two) times daily as needed (rash). ) 30 g 0  . omeprazole (PRILOSEC) 40 MG capsule Take 1 capsule (40 mg total) by mouth daily. (Patient taking differently: Take 40 mg by mouth every morning. ) 90 capsule 1  . oxyCODONE-acetaminophen (PERCOCET) 7.5-325 MG tablet Take 1 tablet by mouth every 8 (eight) hours as needed for severe pain. 60 tablet 0  . OXYGEN Inhale 2 L into the lungs at bedtime.    . predniSONE (DELTASONE) 20 MG tablet Take 1 tablet (20 mg total) by mouth daily. 5 tablet 0  . pregabalin (LYRICA) 50 MG capsule Take 1 capsule (50 mg total) by mouth 3 (three) times daily. 90 capsule 2  . QUEtiapine (SEROQUEL) 25 MG tablet Take 25 mg by mouth at bedtime.    . Suvorexant (BELSOMRA)  20 MG TABS Take 20 mg by mouth at bedtime. 30 tablet 2  . traMADol (ULTRAM) 50 MG tablet Take 1 tablet (50 mg total) by mouth every 6 (six) hours as needed. 90 tablet 0  . traZODone (DESYREL) 150 MG tablet  Take 150 mg by mouth at bedtime.     No current facility-administered medications for this visit.    Facility-Administered Medications Ordered in Other Visits  Medication Dose Route Frequency Provider Last Rate Last Dose  . ipratropium-albuterol (DUONEB) 0.5-2.5 (3) MG/3ML nebulizer solution 3 mL  3 mL Nebulization Q6H Burns, Jennifer E, NP        PHYSICAL EXAMINATION: ECOG PERFORMANCE STATUS: 1 - Symptomatic but completely ambulatory  BP (!) 151/80   Pulse (!) 59   Temp 98 F (36.7 C) (Tympanic)   Resp 20   Ht 5\' 2"  (1.575 m)   Wt 199 lb (90.3 kg)   BMI 36.40 kg/m   Filed Weights   03/24/19 0842  Weight: 199 lb (90.3 kg)    Physical Exam  Constitutional: She is oriented to person, place, and time and well-developed, well-nourished, and in no distress.  She is alone.  She is walking herself.  Not on oxygen.  HENT:  Head: Normocephalic and atraumatic.  Mouth/Throat: Oropharynx is clear and moist. No oropharyngeal exudate.  Eyes: Pupils are equal, round, and reactive to light.  Neck: Normal range of motion. Neck supple.  Cardiovascular: Normal rate and regular rhythm.  Pulmonary/Chest: No respiratory distress. She has no wheezes.  Right chest wall incision wound healing.  Left posterior chest wall no rash.  Positive for tenderness.  Abdominal: Soft. Bowel sounds are normal. She exhibits no distension and no mass. There is no abdominal tenderness. There is no rebound and no guarding.  Musculoskeletal: Normal range of motion.        General: No tenderness or edema.  Neurological: She is alert and oriented to person, place, and time.  Skin: Skin is warm.  Psychiatric: Affect normal.       LABORATORY DATA:  I have reviewed the data as listed    Component Value  Date/Time   NA 140 03/24/2019 0814   NA 137 12/03/2017 1447   NA 137 05/14/2014 1457   K 3.9 03/24/2019 0814   K 3.2 (L) 05/14/2014 1457   CL 104 03/24/2019 0814   CL 103 05/14/2014 1457   CO2 29 03/24/2019 0814   CO2 28 05/14/2014 1457   GLUCOSE 97 03/24/2019 0814   GLUCOSE 80 05/14/2014 1457   BUN 17 03/24/2019 0814   BUN 8 12/03/2017 1447   BUN 8 05/14/2014 1457   CREATININE 0.85 03/24/2019 0814   CREATININE 0.79 05/14/2014 1457   CALCIUM 9.3 03/24/2019 0814   CALCIUM 8.9 05/14/2014 1457   PROT 7.3 03/24/2019 0814   PROT 6.8 12/03/2017 1447   PROT 7.0 05/14/2014 1457   ALBUMIN 3.8 03/24/2019 0814   ALBUMIN 3.8 12/03/2017 1447   ALBUMIN 3.3 (L) 05/14/2014 1457   AST 12 (L) 03/24/2019 0814   AST 9 (L) 05/14/2014 1457   ALT 9 03/24/2019 0814   ALT 15 05/14/2014 1457   ALKPHOS 72 03/24/2019 0814   ALKPHOS 87 05/14/2014 1457   BILITOT 0.6 03/24/2019 0814   BILITOT 0.6 12/03/2017 1447   BILITOT 0.2 05/14/2014 1457   GFRNONAA >60 03/24/2019 0814   GFRNONAA >60 05/14/2014 1457   GFRAA >60 03/24/2019 0814   GFRAA >60 05/14/2014 1457    No results found for: SPEP, UPEP  Lab Results  Component Value Date   WBC 9.1 03/24/2019   NEUTROABS 6.6 03/24/2019   HGB 11.3 (L) 03/24/2019   HCT 36.1 03/24/2019   MCV 81.1 03/24/2019   PLT 214 03/24/2019      Chemistry  Component Value Date/Time   NA 140 03/24/2019 0814   NA 137 12/03/2017 1447   NA 137 05/14/2014 1457   K 3.9 03/24/2019 0814   K 3.2 (L) 05/14/2014 1457   CL 104 03/24/2019 0814   CL 103 05/14/2014 1457   CO2 29 03/24/2019 0814   CO2 28 05/14/2014 1457   BUN 17 03/24/2019 0814   BUN 8 12/03/2017 1447   BUN 8 05/14/2014 1457   CREATININE 0.85 03/24/2019 0814   CREATININE 0.79 05/14/2014 1457      Component Value Date/Time   CALCIUM 9.3 03/24/2019 0814   CALCIUM 8.9 05/14/2014 1457   ALKPHOS 72 03/24/2019 0814   ALKPHOS 87 05/14/2014 1457   AST 12 (L) 03/24/2019 0814   AST 9 (L) 05/14/2014 1457    ALT 9 03/24/2019 0814   ALT 15 05/14/2014 1457   BILITOT 0.6 03/24/2019 0814   BILITOT 0.6 12/03/2017 1447   BILITOT 0.2 05/14/2014 1457       RADIOGRAPHIC STUDIES: I have personally reviewed the radiological images as listed and agreed with the findings in the report. No results found.   ASSESSMENT & PLAN:  Primary cancer of right lower lobe of lung (HCC) #Recurrent squamous cell carcinoma status post biopsy of right hilar lymph node-stage III.   # Long discussion with the patient regarding the goal of treatment of stage III lung cancer-goal is cure; however only 10-20% of the patients are cured.  Patient has unresectable disease.  Discussed with Dr. Faith Rogue.  #Discussed concurrent chemoradiation followed by immunotherapy. Discussed the role of concurrent chemoradiation [weekly carbotaxol with the daily radiation/Monday through Friday ~6 weeks].  Post chemoradiation-consolidation immunotherapy with durvalumab every 2 weeks is recommended based on data from [facility] clinical trial.  Discussed the potential side effects including but not limited to-increasing fatigue, nausea vomiting, diarrhea, hair loss, sores in the mouth, increase risk of infection and also neuropathy.   # Worsening back pain lower thoracic.  The etiology is unclear. Recommend hydrocodone every 8 hours as needed. Will check with Dr.Neievara. recent thoracic MRI- normal. tramadol  # insominia/anxiety- on trazadone/ seroqul; klonipin--stable.   # DISPOSITION:  # port placement with Dr.Oaks # chemo ed  # referral to Dr.Crystal early next week re: lung ca # June 29th- MD- cbc/cmp; weekly carbo-taxol- Dr.B    Orders Placed This Encounter  Procedures  . CBC with Differential    Standing Status:   Future    Standing Expiration Date:   03/23/2020  . Comprehensive metabolic panel    Standing Status:   Future    Standing Expiration Date:   03/23/2020  . Ambulatory referral to General Surgery    Referral Priority:    Routine    Referral Type:   Surgical    Referral Reason:   Specialty Services Required    Referred to Provider:   Nestor Lewandowsky, MD    Requested Specialty:   General Surgery    Number of Visits Requested:   1   All questions were answered. The patient knows to call the clinic with any problems, questions or concerns.      Cammie Sickle, MD 03/24/2019 4:41 PM

## 2019-03-24 NOTE — Assessment & Plan Note (Addendum)
#  Recurrent squamous cell carcinoma status post biopsy of right hilar lymph node-stage III.   # Long discussion with the patient regarding the goal of treatment of stage III lung cancer-goal is cure; however only 10-20% of the patients are cured.  Patient has unresectable disease.  Discussed with Dr. Faith Rogue.  #Discussed concurrent chemoradiation followed by immunotherapy. Discussed the role of concurrent chemoradiation [weekly carbotaxol with the daily radiation/Monday through Friday ~6 weeks].  Post chemoradiation-consolidation immunotherapy with durvalumab every 2 weeks is recommended based on data from [facility] clinical trial.  Discussed the potential side effects including but not limited to-increasing fatigue, nausea vomiting, diarrhea, hair loss, sores in the mouth, increase risk of infection and also neuropathy.   # Worsening back pain lower thoracic.  The etiology is unclear. Recommend hydrocodone every 8 hours as needed. Will check with Dr.Neievara. recent thoracic MRI- normal. tramadol  # insominia/anxiety- on trazadone/ seroqul; klonipin--stable.   # DISPOSITION:  # port placement with Dr.Oaks # chemo ed  # referral to Dr.Crystal early next week re: lung ca # June 29th- MD- cbc/cmp; weekly carbo-taxol- Dr.B

## 2019-03-24 NOTE — Progress Notes (Signed)
START ON PATHWAY REGIMEN - Non-Small Cell Lung     Administer weekly:     Paclitaxel      Carboplatin   **Always confirm dose/schedule in your pharmacy ordering system**  Patient Characteristics: Stage III - Unresectable, PS = 0, 1 AJCC T Category: TX Current Disease Status: No Distant Mets or Local Recurrence AJCC N Category: N1 AJCC M Category: M0 AJCC 8 Stage Grouping: Unknown ECOG Performance Status: 0 Intent of Therapy: Curative Intent, Discussed with Patient

## 2019-03-29 ENCOUNTER — Other Ambulatory Visit: Payer: Self-pay

## 2019-03-29 DIAGNOSIS — Z96659 Presence of unspecified artificial knee joint: Secondary | ICD-10-CM | POA: Diagnosis not present

## 2019-03-29 DIAGNOSIS — C3490 Malignant neoplasm of unspecified part of unspecified bronchus or lung: Secondary | ICD-10-CM | POA: Diagnosis not present

## 2019-03-30 ENCOUNTER — Ambulatory Visit
Admission: RE | Admit: 2019-03-30 | Discharge: 2019-03-30 | Disposition: A | Discharge status: Home / Self Care | Payer: Medicare Other | Source: Ambulatory Visit | Attending: Radiation Oncology | Admitting: Radiation Oncology

## 2019-03-30 ENCOUNTER — Encounter: Payer: Self-pay | Admitting: Radiation Oncology

## 2019-03-30 ENCOUNTER — Encounter: Payer: Self-pay | Admitting: *Deleted

## 2019-03-30 ENCOUNTER — Other Ambulatory Visit: Payer: Self-pay

## 2019-03-30 VITALS — BP 157/86 | HR 62 | Temp 98.7°F | Resp 18 | Wt 196.2 lb

## 2019-03-30 DIAGNOSIS — G4700 Insomnia, unspecified: Secondary | ICD-10-CM | POA: Insufficient documentation

## 2019-03-30 DIAGNOSIS — Z8521 Personal history of malignant neoplasm of larynx: Secondary | ICD-10-CM | POA: Insufficient documentation

## 2019-03-30 DIAGNOSIS — Z803 Family history of malignant neoplasm of breast: Secondary | ICD-10-CM | POA: Insufficient documentation

## 2019-03-30 DIAGNOSIS — Z7982 Long term (current) use of aspirin: Secondary | ICD-10-CM | POA: Insufficient documentation

## 2019-03-30 DIAGNOSIS — Z79899 Other long term (current) drug therapy: Secondary | ICD-10-CM | POA: Insufficient documentation

## 2019-03-30 DIAGNOSIS — Z8614 Personal history of Methicillin resistant Staphylococcus aureus infection: Secondary | ICD-10-CM | POA: Insufficient documentation

## 2019-03-30 DIAGNOSIS — J45909 Unspecified asthma, uncomplicated: Secondary | ICD-10-CM | POA: Insufficient documentation

## 2019-03-30 DIAGNOSIS — C771 Secondary and unspecified malignant neoplasm of intrathoracic lymph nodes: Secondary | ICD-10-CM | POA: Diagnosis not present

## 2019-03-30 DIAGNOSIS — E785 Hyperlipidemia, unspecified: Secondary | ICD-10-CM | POA: Diagnosis not present

## 2019-03-30 DIAGNOSIS — J449 Chronic obstructive pulmonary disease, unspecified: Secondary | ICD-10-CM | POA: Diagnosis not present

## 2019-03-30 DIAGNOSIS — C3431 Malignant neoplasm of lower lobe, right bronchus or lung: Secondary | ICD-10-CM | POA: Diagnosis not present

## 2019-03-30 DIAGNOSIS — D649 Anemia, unspecified: Secondary | ICD-10-CM | POA: Diagnosis not present

## 2019-03-30 DIAGNOSIS — Z87891 Personal history of nicotine dependence: Secondary | ICD-10-CM | POA: Insufficient documentation

## 2019-03-30 DIAGNOSIS — F329 Major depressive disorder, single episode, unspecified: Secondary | ICD-10-CM | POA: Insufficient documentation

## 2019-03-30 DIAGNOSIS — K219 Gastro-esophageal reflux disease without esophagitis: Secondary | ICD-10-CM | POA: Insufficient documentation

## 2019-03-30 NOTE — Progress Notes (Signed)
  Oncology Nurse Navigator Documentation  Navigator Location: [facility] (03/30/19 1100)   )Navigator Encounter Type: Initial RadOnc (03/30/19 1100)   Abnormal Finding Date: 02/21/19 (03/30/19 1100) Confirmed Diagnosis Date: 03/23/19 (03/30/19 1100)               Patient Visit Type: RadOnc (03/30/19 1100) Treatment Phase: Pre-Tx/Tx Discussion (03/30/19 1100) Barriers/Navigation Needs: Coordination of Care;Education (03/30/19 1100) Education: Understanding Cancer/ Treatment Options (03/30/19 1100) Interventions: Coordination of Care;Education (03/30/19 1100)   Coordination of Care: Appts (03/30/19 1100) Education Method: Verbal;Written (03/30/19 1100)         met with patient during initial rad-onc consultation with Dr. Baruch Gouty. All questions answered during visit. Pt given resources regarding diagnosis and supportive services available. Reviewed upcoming appts. Instructed pt to call with any further questions or needs. Pt verbalized understanding. Nothing further needed at this time.        Time Spent with Patient: 60 (03/30/19 1100)

## 2019-03-30 NOTE — Consult Note (Signed)
NEW PATIENT EVALUATION  Name: Susan Houston  MRN: [id number]  Date:   03/30/2019     DOB: 06-03-1961   This 58 y.o. female patient presents to the clinic for initial evaluation of stage IIIa (T1 N2 M0) squamous cell carcinoma of the right lung status post wedge resection with now recurrence.  REFERRING PHYSICIAN: Valerie Roys, DO  CHIEF COMPLAINT:  Chief Complaint  Patient presents with  . Lung Cancer    Ref Dr. Jacinto Reap lung    DIAGNOSIS: The encounter diagnosis was Primary cancer of right lower lobe of lung ([place]).   PREVIOUS INVESTIGATIONS:  CT scans and PET CT scan reviewed Pathology report reviewed Clinical notes reviewed Case presented at weekly tumor conference  HPI: Patient is a 58 year old female well-known to our department having been treated back in 2011 for stage II laryngeal carcinoma.  In 2019 she had a 1 cm right lower lobe nodule status post wedge resection for stage I squamous cell carcinoma no adjuvant therapy given.  On surveillance she was noted to have prominence of the right hilum.  CTA scan also demonstrated central right lower lobe parabronchial nodule new mild right hilar and right infrahilar adenopathy concerning for recurrent lung malignancy.  She underwent PET CT scan showing hypermetabolic right hilar and infrahilar lymph nodes.  No evidence of distant disease was noted.  She underwent fine-needle aspiration of the right hilar lymph node which was positive for malignant cells consistent with metastatic squamous cell carcinoma she has been having some back pain although MRI of her thoracic spine showed no evidence to suggest osseous metastatic disease.  She specifically denies cough hemoptysis or chest tightness.  She is quite depressed about her recurrence.  She has no overt symptoms from prior treatment for head and neck cancer.  PLANNED TREATMENT REGIMEN: Concurrent chemoradiation  PAST MEDICAL HISTORY:  has a past medical history of Anemia, Anxiety, Arthritis,  Asthma, Back pain, Bruises easily, Chronic leg pain, COPD (chronic obstructive pulmonary disease) ([place]), Depression, Difficulty sleeping, Diverticulitis, Dyspnea, Dysrhythmia (04/2018), Facet syndrome, lumbar (03/28/2015), GERD (gastroesophageal reflux disease), Hemorrhoids, History of kidney stones (04/2018), Hyperlipidemia, Incontinence of urine, Laryngeal cancer ([place]) (2009), Migraine, Migraine without aura and without status migrainosus, not intractable (09/11/2016), Mild cognitive impairment (11/23/2017), MRSA infection greater than 3 months ago, Nerve damage, Nodule of lower lobe of right lung (04/22/2018), Oxygen deficiency, Primary cancer of right lower lobe of lung ([place]) (04/18/2018), and Wears dentures.    PAST SURGICAL HISTORY:  Past Surgical History:  Procedure Laterality Date  . ABDOMINAL HYSTERECTOMY  1983  . BLADDER SUSPENSION  2014  . CATARACT EXTRACTION W/PHACO Left 05/11/2018   Procedure: CATARACT EXTRACTION PHACO AND INTRAOCULAR LENS PLACEMENT ([place]) LEFT;  Surgeon: Leandrew Koyanagi, MD;  Location: [facility];  Service: Ophthalmology;  Laterality: Left;  PREFERS EARLY  . CATARACT EXTRACTION W/PHACO Right 08/11/2018   Procedure: CATARACT EXTRACTION PHACO AND INTRAOCULAR LENS PLACEMENT ([facility]);  Surgeon: Leandrew Koyanagi, MD;  Location: [facility];  Service: Ophthalmology;  Laterality: Right;  CDE 5:02 Total Time 01:35 Fluid lot # [id number]  . CHOLECYSTECTOMY  1998  . COLONOSCOPY    . ENDOBRONCHIAL ULTRASOUND N/A 03/20/2019   Procedure: ENDOBRONCHIAL ULTRASOUND;  Surgeon: Flora Lipps, MD;  Location: [facility];  Service: Cardiopulmonary;  Laterality: N/A;  . ESOPHAGOGASTRODUODENOSCOPY  2015  . ESOPHAGOGASTRODUODENOSCOPY (EGD) WITH PROPOFOL N/A 11/20/2016   Procedure: ESOPHAGOGASTRODUODENOSCOPY (EGD) WITH PROPOFOL;  Surgeon: Jonathon Bellows, MD;  Location: [facility];  Service: Endoscopy;  Laterality: N/A;  . EXCISION NEUROMA  Bilateral 09/24/2014   Procedure: BILATERAL OPEN  SAPHENOUS NEURECTOMIES AND OPEN LEFT PERIPATELLA OSETOPHYTECTOMY;  Surgeon: Mauri Pole, MD;  Location: [facility];  Service: Orthopedics;  Laterality: Bilateral;  . EYE SURGERY Left 05/11/2018   cataract extraction  . JOINT REPLACEMENT  2009/2010   BIL TOTAL KNEES  . JOINT REPLACEMENT  2020   left  . THORACOTOMY/LOBECTOMY Right 05/16/2018   Procedure: THORACOTOMY/POSSIBLE LOBECTOMY;  Surgeon: Nestor Lewandowsky, MD;  Location: [facility];  Service: Thoracic;  Laterality: Right;  . TONSILLECTOMY    . TOTAL KNEE REVISION Left 11/29/2018   Procedure: Left TOTAL KNEE REVISION;  Surgeon: Hessie Knows, MD;  Location: [facility];  Service: Orthopedics;  Laterality: Left;  Marland Kitchen VIDEO BRONCHOSCOPY N/A 05/16/2018   Procedure: PREOP  BRONCHOSCOPY;  Surgeon: Nestor Lewandowsky, MD;  Location: [facility];  Service: Thoracic;  Laterality: N/A;    FAMILY HISTORY: family history includes Alcohol abuse in her brother; Alzheimer's disease in her paternal grandfather; Anxiety disorder in her sister; Arthritis in her father; Asthma in her mother; Breast cancer (age of onset: 11) in her maternal grandmother; Cancer in her father and maternal grandfather; Cirrhosis in her mother; Depression in her sister; Diabetes in her mother and sister; Gout in her sister; Heart disease in her brother; Hyperlipidemia in her father and mother; Hypertension in her father, mother, and sister; Kidney Stones in her son; Pneumonia in her paternal grandmother; Stroke in her maternal grandmother.  SOCIAL HISTORY:  reports that she quit smoking about 2 years ago. Her smoking use included cigarettes. She smoked 0.50 packs per day. She has never used smokeless tobacco. She reports that she does not drink alcohol or use drugs.  ALLERGIES: Morphine and related, Cymbalta [duloxetine hcl], Adhesive [tape], and Lexapro [escitalopram oxalate]  MEDICATIONS:  Current Outpatient Medications  Medication Sig Dispense Refill  . albuterol (PROVENTIL HFA;VENTOLIN HFA) 108  (90 Base) MCG/ACT inhaler Inhale 2 puffs into the lungs every 4 (four) hours as needed for wheezing or shortness of breath. 1 Inhaler 2  . albuterol (PROVENTIL) (2.5 MG/3ML) 0.083% nebulizer solution Take 3 mLs (2.5 mg total) by nebulization every 4 (four) hours as needed for wheezing or shortness of breath. 75 mL 12  . aspirin EC 81 MG tablet Take 81 mg by mouth daily.    Marland Kitchen aspirin-acetaminophen-caffeine (EXCEDRIN MIGRAINE) 250-250-65 MG tablet Take 1 tablet by mouth daily as needed for headache.    . clonazePAM (KLONOPIN) 1 MG tablet Take 1 mg by mouth at bedtime.    Marland Kitchen estradiol (ESTRACE) 1 MG tablet Take 1 mg by mouth daily.     Marland Kitchen FLUoxetine (PROZAC) 40 MG capsule Take 1 capsule (40 mg total) by mouth daily. (Patient taking differently: Take 40 mg by mouth every morning. ) 90 capsule 0  . guaiFENesin-codeine 100-10 MG/5ML syrup Take 10 mLs by mouth 3 (three) times daily as needed for cough. 120 mL 0  . hydrOXYzine (VISTARIL) 25 MG capsule TAKE 1 CAPSULE(25 MG) BY MOUTH TWICE DAILY AS NEEDED FOR SEVERE ANXIETY OR SYMPTOMS (Patient taking differently: Take 25 mg by mouth 2 (two) times a day. ) 60 capsule 1  . nystatin cream (MYCOSTATIN) Apply 1 application topically 2 (two) times daily. (Patient taking differently: Apply 1 application topically 2 (two) times daily as needed (rash). ) 30 g 0  . omeprazole (PRILOSEC) 40 MG capsule Take 1 capsule (40 mg total) by mouth daily. (Patient taking differently: Take 40 mg by mouth every morning. ) 90 capsule 1  . oxyCODONE-acetaminophen (  PERCOCET) 7.5-325 MG tablet Take 1 tablet by mouth every 8 (eight) hours as needed for severe pain. 60 tablet 0  . OXYGEN Inhale 2 L into the lungs at bedtime.    . predniSONE (DELTASONE) 20 MG tablet Take 1 tablet (20 mg total) by mouth daily. 5 tablet 0  . pregabalin (LYRICA) 50 MG capsule Take 1 capsule (50 mg total) by mouth 3 (three) times daily. 90 capsule 2  . QUEtiapine (SEROQUEL) 25 MG tablet Take 25 mg by mouth at  bedtime.    . Suvorexant (BELSOMRA) 20 MG TABS Take 20 mg by mouth at bedtime. 30 tablet 2  . traMADol (ULTRAM) 50 MG tablet Take 1 tablet (50 mg total) by mouth every 6 (six) hours as needed. 90 tablet 0  . traZODone (DESYREL) 150 MG tablet Take 150 mg by mouth at bedtime.     No current facility-administered medications for this encounter.    Facility-Administered Medications Ordered in Other Encounters  Medication Dose Route Frequency Provider Last Rate Last Dose  . ipratropium-albuterol (DUONEB) 0.5-2.5 (3) MG/3ML nebulizer solution 3 mL  3 mL Nebulization Q6H Burns, Jennifer E, NP        ECOG PERFORMANCE STATUS:  0 - Asymptomatic  REVIEW OF SYSTEMS: Patient denies any weight loss, fatigue, weakness, fever, chills or night sweats. Patient denies any loss of vision, blurred vision. Patient denies any ringing  of the ears or hearing loss. No irregular heartbeat. Patient denies heart murmur or history of fainting. Patient denies any chest pain or pain radiating to her upper extremities. Patient denies any shortness of breath, difficulty breathing at night, cough or hemoptysis. Patient denies any swelling in the lower legs. Patient denies any nausea vomiting, vomiting of blood, or coffee ground material in the vomitus. Patient denies any stomach pain. Patient states has had normal bowel movements no significant constipation or diarrhea. Patient denies any dysuria, hematuria or significant nocturia. Patient denies any problems walking, swelling in the joints or loss of balance. Patient denies any skin changes, loss of hair or loss of weight. Patient denies any excessive worrying or anxiety or significant depression. Patient denies any problems with insomnia. Patient denies excessive thirst, polyuria, polydipsia. Patient denies any swollen glands, patient denies easy bruising or easy bleeding. Patient denies any recent infections, allergies or URI. Patient "s visual fields have not changed significantly  in recent time.   PHYSICAL EXAM: BP (!) 157/86 (BP Location: Left Arm, Patient Position: Sitting)   Pulse 62   Temp 98.7 F (37.1 C) (Tympanic)   Resp 18   Wt 196 lb 3.4 oz (89 kg)   BMI 35.89 kg/m  Well-developed well-nourished patient in NAD. HEENT reveals PERLA, EOMI, discs not visualized.  Oral cavity is clear. No oral mucosal lesions are identified. Neck is clear without evidence of cervical or supraclavicular adenopathy. Lungs are clear to A&P. Cardiac examination is essentially unremarkable with regular rate and rhythm without murmur rub or thrill. Abdomen is benign with no organomegaly or masses noted. Motor sensory and DTR levels are equal and symmetric in the upper and lower extremities. Cranial nerves II through XII are grossly intact. Proprioception is intact. No peripheral adenopathy or edema is identified. No motor or sensory levels are noted. Crude visual fields are within normal range.  LABORATORY DATA: Pathology report reviewed    RADIOLOGY RESULTS: PET CT scan CT scans reviewed compatible with above-stated findings   IMPRESSION: Initially stage I right lower lobe lung cancer now stage IIIa with recurrence  and hilar lymph node involvement of squamous cell carcinoma in 58 year old female  PLAN: At this time would recommend concurrent chemoradiation therapy with curative intent I would plan on delivering 6600 cGy to her right chest.  I would use PET fusion study for treatment planning.  Risks and benefits of treatment including increased production of cough fatigue alteration of blood counts skin reaction loss of normal lung volume all were discussed in detail with the patient.  There will be extra effort by both professional staff as well as technical staff to coordinate and manage concurrent chemoradiation and ensuing side effects during her treatments.  I have personally set up and ordered CT simulation for early next week.  She seems to comprehend her treatment plan well.   Nurse navigator was present as well as husband of by phone during my consultation.  Patient comprehends my treatment plan well.  I would like to take this opportunity to thank you for allowing me to participate in the care of your patient.Noreene Filbert, MD

## 2019-03-31 ENCOUNTER — Encounter: Payer: Self-pay | Admitting: Cardiothoracic Surgery

## 2019-03-31 ENCOUNTER — Ambulatory Visit (INDEPENDENT_AMBULATORY_CARE_PROVIDER_SITE_OTHER): Payer: Medicare Other | Admitting: Cardiothoracic Surgery

## 2019-03-31 ENCOUNTER — Other Ambulatory Visit
Admission: RE | Admit: 2019-03-31 | Discharge: 2019-03-31 | Disposition: A | Discharge status: Home / Self Care | Payer: Medicare Other | Source: Ambulatory Visit | Attending: Cardiothoracic Surgery | Admitting: Cardiothoracic Surgery

## 2019-03-31 VITALS — BP 120/83 | HR 70 | Temp 97.3°F | Ht 65.0 in | Wt 196.0 lb

## 2019-03-31 DIAGNOSIS — Z85118 Personal history of other malignant neoplasm of bronchus and lung: Secondary | ICD-10-CM | POA: Diagnosis not present

## 2019-03-31 DIAGNOSIS — K219 Gastro-esophageal reflux disease without esophagitis: Secondary | ICD-10-CM | POA: Diagnosis not present

## 2019-03-31 DIAGNOSIS — R918 Other nonspecific abnormal finding of lung field: Secondary | ICD-10-CM

## 2019-03-31 DIAGNOSIS — C3490 Malignant neoplasm of unspecified part of unspecified bronchus or lung: Secondary | ICD-10-CM | POA: Diagnosis not present

## 2019-03-31 DIAGNOSIS — Z1159 Encounter for screening for other viral diseases: Secondary | ICD-10-CM | POA: Diagnosis not present

## 2019-03-31 DIAGNOSIS — F419 Anxiety disorder, unspecified: Secondary | ICD-10-CM | POA: Diagnosis not present

## 2019-03-31 DIAGNOSIS — Z923 Personal history of irradiation: Secondary | ICD-10-CM | POA: Diagnosis not present

## 2019-03-31 DIAGNOSIS — Z8521 Personal history of malignant neoplasm of larynx: Secondary | ICD-10-CM | POA: Diagnosis not present

## 2019-03-31 DIAGNOSIS — G709 Myoneural disorder, unspecified: Secondary | ICD-10-CM | POA: Diagnosis not present

## 2019-03-31 DIAGNOSIS — Z87891 Personal history of nicotine dependence: Secondary | ICD-10-CM | POA: Diagnosis not present

## 2019-03-31 DIAGNOSIS — Z9221 Personal history of antineoplastic chemotherapy: Secondary | ICD-10-CM | POA: Diagnosis not present

## 2019-03-31 DIAGNOSIS — G8929 Other chronic pain: Secondary | ICD-10-CM | POA: Diagnosis not present

## 2019-03-31 DIAGNOSIS — D649 Anemia, unspecified: Secondary | ICD-10-CM | POA: Diagnosis not present

## 2019-03-31 DIAGNOSIS — M79605 Pain in left leg: Secondary | ICD-10-CM | POA: Diagnosis not present

## 2019-03-31 DIAGNOSIS — Z9842 Cataract extraction status, left eye: Secondary | ICD-10-CM | POA: Diagnosis not present

## 2019-03-31 DIAGNOSIS — J449 Chronic obstructive pulmonary disease, unspecified: Secondary | ICD-10-CM | POA: Diagnosis not present

## 2019-03-31 DIAGNOSIS — Z90710 Acquired absence of both cervix and uterus: Secondary | ICD-10-CM | POA: Diagnosis not present

## 2019-03-31 DIAGNOSIS — G479 Sleep disorder, unspecified: Secondary | ICD-10-CM | POA: Diagnosis not present

## 2019-03-31 DIAGNOSIS — C3431 Malignant neoplasm of lower lobe, right bronchus or lung: Secondary | ICD-10-CM | POA: Diagnosis not present

## 2019-03-31 DIAGNOSIS — G43909 Migraine, unspecified, not intractable, without status migrainosus: Secondary | ICD-10-CM | POA: Diagnosis not present

## 2019-03-31 DIAGNOSIS — M1990 Unspecified osteoarthritis, unspecified site: Secondary | ICD-10-CM | POA: Diagnosis not present

## 2019-03-31 DIAGNOSIS — F329 Major depressive disorder, single episode, unspecified: Secondary | ICD-10-CM | POA: Diagnosis not present

## 2019-03-31 DIAGNOSIS — R911 Solitary pulmonary nodule: Secondary | ICD-10-CM | POA: Diagnosis not present

## 2019-03-31 DIAGNOSIS — Z87442 Personal history of urinary calculi: Secondary | ICD-10-CM | POA: Diagnosis not present

## 2019-03-31 DIAGNOSIS — M79604 Pain in right leg: Secondary | ICD-10-CM | POA: Diagnosis not present

## 2019-03-31 DIAGNOSIS — Z9841 Cataract extraction status, right eye: Secondary | ICD-10-CM | POA: Diagnosis not present

## 2019-03-31 LAB — COMPREHENSIVE METABOLIC PANEL
ALT: 12 U/L (ref 0–44)
AST: 16 U/L (ref 15–41)
Albumin: 3.7 g/dL (ref 3.5–5.0)
Alkaline Phosphatase: 66 U/L (ref 38–126)
Anion gap: 8 (ref 5–15)
BUN: 8 mg/dL (ref 6–20)
CO2: 26 mmol/L (ref 22–32)
Calcium: 9 mg/dL (ref 8.9–10.3)
Chloride: 107 mmol/L (ref 98–111)
Creatinine, Ser: 0.62 mg/dL (ref 0.44–1.00)
GFR calc Af Amer: >60 mL/min (ref >60)
GFR calc non Af Amer: >60 mL/min (ref >60)
Glucose, Bld: 92 mg/dL (ref 70–99)
Potassium: 3.6 mmol/L (ref 3.5–5.1)
Sodium: 141 mmol/L (ref 135–145)
Total Bilirubin: 0.6 mg/dL (ref 0.3–1.2)
Total Protein: 7.1 g/dL (ref 6.5–8.1)

## 2019-03-31 LAB — CBC WITH DIFFERENTIAL/PLATELET
Abs Immature Granulocytes: 0.04 10*3/uL (ref 0.00–0.07)
Basophils Absolute: 0.1 10*3/uL (ref 0.0–0.1)
Basophils Relative: 1 %
Eosinophils Absolute: 0.4 10*3/uL (ref 0.0–0.5)
Eosinophils Relative: 5 %
HCT: 35 % — ABNORMAL LOW (ref 36.0–46.0)
Hemoglobin: 11 g/dL — ABNORMAL LOW (ref 12.0–15.0)
Immature Granulocytes: 1 %
Lymphocytes Relative: 28 %
Lymphs Abs: 2 10*3/uL (ref 0.7–4.0)
MCH: 25.6 pg — ABNORMAL LOW (ref 26.0–34.0)
MCHC: 31.4 g/dL (ref 30.0–36.0)
MCV: 81.4 fL (ref 80.0–100.0)
Monocytes Absolute: 0.7 10*3/uL (ref 0.1–1.0)
Monocytes Relative: 9 %
Neutro Abs: 4 10*3/uL (ref 1.7–7.7)
Neutrophils Relative %: 56 %
Platelets: 236 10*3/uL (ref 150–400)
RBC: 4.3 MIL/uL (ref 3.87–5.11)
RDW: 14.6 % (ref 11.5–15.5)
WBC: 7.1 10*3/uL (ref 4.0–10.5)
nRBC: 0 % (ref 0.0–0.2)

## 2019-03-31 LAB — APTT: aPTT: 31 seconds (ref 24–36)

## 2019-03-31 LAB — PROTIME-INR
INR: 1.1 (ref 0.8–1.2)
Prothrombin Time: 14.5 seconds (ref 11.4–15.2)

## 2019-03-31 NOTE — Patient Instructions (Signed)
Implanted Port Insertion Implanted port insertion is a procedure to put in a port and catheter. The port is a device with an injectable disk that can be accessed by your health care provider. The port is connected to a vein in the chest or neck by a small flexible tube (catheter). There are different types of ports. The implanted port may be used as a long-term IV access for:  Medicines, such as chemotherapy.  Fluids.  Liquid nutrition, such as total parenteral nutrition (TPN). When you have a port, this means that your health care provider will not need to use the veins in your arms for these procedures. Tell a health care provider about:  Any allergies you have.  All medicines you are taking, especially blood thinners, as well as any vitamins, herbs, eye drops, creams, over-the-counter medicines, and steroids.  Any problems you or family members have had with anesthetic medicines.  Any blood disorders you have.  Any surgeries you have had.  Any medical conditions you have or have had, including diabetes or kidney problems.  Whether you are pregnant or may be pregnant. What are the risks? Generally, this is a safe procedure. However, problems may occur, including:  Allergic reactions to medicines or dyes.  Damage to other structures or organs.  Infection.  Damage to the blood vessel, bruising, or bleeding at the puncture site.  Blood clot.  Breakdown of the skin over the port.  A collection of air in the chest that can cause one of the lungs to collapse (pneumothorax). This is rare. What happens before the procedure? Medicines  Ask your health care provider about: ? Changing or stopping your regular medicines. This is especially important if you are taking diabetes medicines or blood thinners. ? Taking medicines such as aspirin and ibuprofen. These medicines can thin your blood. Do not take these medicines unless your health care provider tells you to take them. ?  Taking over-the-counter medicines, vitamins, herbs, and supplements. Staying hydrated Follow instructions from your health care provider about hydration, which may include:  Up to 2 hours before the procedure - you may continue to drink clear liquids, such as water, clear fruit juice, black coffee, and plain tea.  Eating and drinking restrictions  Follow instructions from your health care provider about eating and drinking, which may include: ? 8 hours before the procedure - stop eating heavy meals or foods, such as meat, fried foods, or fatty foods. ? 6 hours before the procedure - stop eating light meals or foods, such as toast or cereal. ? 6 hours before the procedure - stop drinking milk or drinks that contain milk. ? 2 hours before the procedure - stop drinking clear liquids. General instructions  Plan to have someone take you home from the hospital or clinic.  If you will be going home right after the procedure, plan to have someone with you for 24 hours.  You may have blood tests.  Do not use any products that contain nicotine or tobacco for at least 4-6 weeks before the procedure. These products include cigarettes, e-cigarettes, and chewing tobacco. If you need help quitting, ask your health care provider.  Ask your health care provider what steps will be taken to help prevent infection. These may include: ? Removing hair at the surgery site. ? Washing skin with a germ-killing soap. ? Taking antibiotic medicine. What happens during the procedure?   An IV will be inserted into one of your veins.  You will be given  one or more of the following: ? A medicine to help you relax (sedative). ? A medicine to numb the area (local anesthetic).  Two small incisions will be made to insert the port. ? One smaller incision will be made in your neck to get access to the vein where the catheter will lie. ? The other incision will be made in the upper chest. This is where the port will  lie.  The procedure may be done using continuous X-ray (fluoroscopy) or other imaging tools for guidance.  The port and catheter will be placed. There may be a small, raised area where the port is.  The port will be flushed with a salt solution (saline), and blood will be drawn to make sure that it is working correctly.  The incisions will be closed.  Bandages (dressings) may be placed over the incisions. The procedure may vary among health care providers and hospitals. What happens after the procedure?  Your blood pressure, heart rate, breathing rate, and blood oxygen level will be monitored until you leave the hospital or clinic.  Do not drive for 24 hours if you were given a sedative during your procedure.  You will be given a manufacturer's information card for the type of port that you have. Keep this with you.  Your port will need to be flushed and checked as told by your health care provider, usually every few weeks.  A chest X-ray will be done to: ? Check the placement of the port. ? Make sure there is no injury to your lung. Summary  Implanted port insertion is a procedure to put in a port and catheter.  The implanted port is used as a long-term IV access.  The port will need to be flushed and checked as told by your health care provider, usually every few weeks.  Keep your manufacturer's information card with you at all times. This information is not intended to replace advice given to you by your health care provider. Make sure you discuss any questions you have with your health care provider. Document Released: 07/26/2013 Document Revised: 05/03/2018 Document Reviewed: 05/03/2018 Elsevier Interactive Patient Education  [employer].

## 2019-03-31 NOTE — H&P (View-Only) (Signed)
Patient ID: Susan Houston, female   DOB: [birth year], 59 y.o.   MRN: [id number]  Chief Complaint  Patient presents with  . Other    Referred By Dr. Iris Pert Monday Reason for Referral Port-A-Cath insertion  HPI Location, Quality, Duration, Severity, Timing, Context, Modifying Factors, Associated Signs and Symptoms.  Susan Houston is a 58 y.o. female.  She had previously undergone a muscle-sparing right thoracotomy about a year ago for a right lower lobe peripheral lung nodule.  She had a prior history of head neck cancer and the frozen section in the operating room confirmed a squamous cell carcinoma.  Although it was unclear whether or not this was a primary or metastatic lesion she did well for the subsequent year but on her most recent scan she has developed hilar and infrahilar adenopathy consistent with recurrence.  She did undergo a repeat bronchoscopy which confirmed the presence of metastatic lung cancer.  She presents now for consideration of Port-A-Cath insertion.   Past Medical History:  Diagnosis Date  . Anemia    vitamin b12 deficiency. no longer taking supplements  . Anxiety   . Arthritis   . Asthma   . Back pain   . Bruises easily   . Chronic leg pain    BILATERAL  . COPD (chronic obstructive pulmonary disease) (HCC)    no inhalers for over 1 year  . Depression   . Difficulty sleeping   . Diverticulitis   . Dyspnea    due to having part of lung removed in 2019  . Dysrhythmia 04/2018   brady episodes. cleared by dr. Clayborn Bigness  . Facet syndrome, lumbar 03/28/2015  . GERD (gastroesophageal reflux disease)   . Hemorrhoids   . History of kidney stones 04/2018   recently passed stone  . Hyperlipidemia   . Incontinence of urine   . Laryngeal cancer ([place]) 2009   LARYNX CANCER - CHEMO / RADIATION (NO SURGERY)   . Migraine   . Migraine without aura and without status migrainosus, not intractable 09/11/2016  . Mild cognitive impairment 11/23/2017  . MRSA infection greater than 3  months ago    2008 right side of face  . Nerve damage    right leg. thinks this is from her TKR  . Nodule of lower lobe of right lung 04/22/2018  . Oxygen deficiency    2L/HS  . Primary cancer of right lower lobe of lung ([place]) 04/18/2018   Surgical resection of RLL  . Wears dentures    full upper and lower    Past Surgical History:  Procedure Laterality Date  . ABDOMINAL HYSTERECTOMY  1983  . BLADDER SUSPENSION  2014  . CATARACT EXTRACTION W/PHACO Left 05/11/2018   Procedure: CATARACT EXTRACTION PHACO AND INTRAOCULAR LENS PLACEMENT ([place]) LEFT;  Surgeon: Leandrew Koyanagi, MD;  Location: [facility];  Service: Ophthalmology;  Laterality: Left;  PREFERS EARLY  . CATARACT EXTRACTION W/PHACO Right 08/11/2018   Procedure: CATARACT EXTRACTION PHACO AND INTRAOCULAR LENS PLACEMENT ([facility]);  Surgeon: Leandrew Koyanagi, MD;  Location: [facility];  Service: Ophthalmology;  Laterality: Right;  CDE 5:02 Total Time 01:35 Fluid lot # [id number]  . CHOLECYSTECTOMY  1998  . COLONOSCOPY    . ENDOBRONCHIAL ULTRASOUND N/A 03/20/2019   Procedure: ENDOBRONCHIAL ULTRASOUND;  Surgeon: Flora Lipps, MD;  Location: [facility];  Service: Cardiopulmonary;  Laterality: N/A;  . ESOPHAGOGASTRODUODENOSCOPY  2015  . ESOPHAGOGASTRODUODENOSCOPY (EGD) WITH PROPOFOL N/A 11/20/2016   Procedure: ESOPHAGOGASTRODUODENOSCOPY (EGD) WITH PROPOFOL;  Surgeon: Jonathon Bellows, MD;  Location: [facility];  Service: Endoscopy;  Laterality: N/A;  . EXCISION NEUROMA Bilateral 09/24/2014   Procedure: BILATERAL OPEN SAPHENOUS NEURECTOMIES AND OPEN LEFT PERIPATELLA OSETOPHYTECTOMY;  Surgeon: Mauri Pole, MD;  [facility];  Service: Orthopedics;  Laterality: Bilateral;  . EYE SURGERY Left 05/11/2018   cataract extraction  . JOINT REPLACEMENT  2009/2010   BIL TOTAL KNEES  . JOINT REPLACEMENT  2020   left  . THORACOTOMY/LOBECTOMY Right 05/16/2018   Procedure: THORACOTOMY/POSSIBLE LOBECTOMY;  Surgeon: Nestor Lewandowsky, MD;  Location:  [facility];  Service: Thoracic;  Laterality: Right;  . TONSILLECTOMY    . TOTAL KNEE REVISION Left 11/29/2018   Procedure: Left TOTAL KNEE REVISION;  Surgeon: Hessie Knows, MD;  Location: [facility];  Service: Orthopedics;  Laterality: Left;  Marland Kitchen VIDEO BRONCHOSCOPY N/A 05/16/2018   Procedure: PREOP  BRONCHOSCOPY;  Surgeon: Nestor Lewandowsky, MD;  Location: [facility];  Service: Thoracic;  Laterality: N/A;    Family History  Problem Relation Age of Onset  . Asthma Mother   . Diabetes Mother   . Hyperlipidemia Mother   . Hypertension Mother   . Cirrhosis Mother        Non-alcoholic  . Arthritis Father   . Cancer Father        Bone  . Hyperlipidemia Father   . Hypertension Father   . Anxiety disorder Sister   . Depression Sister   . Kidney Stones Son   . Stroke Maternal Grandmother   . Breast cancer Maternal Grandmother 46  . Cancer Maternal Grandfather        lung  . Pneumonia Paternal Grandmother   . Alzheimer's disease Paternal Grandfather   . Diabetes Sister   . Gout Sister   . Hypertension Sister   . Alcohol abuse Brother   . Heart disease Brother        massive MI    Social History Social History   Tobacco Use  . Smoking status: Former Smoker    Packs/day: 0.50    Types: Cigarettes    Quit date: 04/07/2016    Years since quitting: 2.9  . Smokeless tobacco: Never Used  . Tobacco comment: patient has not smoked x 10 days.06/28/17 chantix  Substance Use Topics  . Alcohol use: No    Alcohol/week: 0.0 standard drinks  . Drug use: No    Allergies  Allergen Reactions  . Morphine And Related Itching    Can tolerate with benadryl  . Cymbalta [Duloxetine Hcl] Other (See Comments)    Pt states it keeps her awake  . Adhesive [Tape] Other (See Comments)    Plastic tape rips skin and bruises her. PLEASE USE PAPER TAPE  . Lexapro [Escitalopram Oxalate] Other (See Comments)    Keeps awake for days.     Current Outpatient Medications  Medication Sig Dispense Refill  .  albuterol (PROVENTIL HFA;VENTOLIN HFA) 108 (90 Base) MCG/ACT inhaler Inhale 2 puffs into the lungs every 4 (four) hours as needed for wheezing or shortness of breath. 1 Inhaler 2  . albuterol (PROVENTIL) (2.5 MG/3ML) 0.083% nebulizer solution Take 3 mLs (2.5 mg total) by nebulization every 4 (four) hours as needed for wheezing or shortness of breath. 75 mL 12  . aspirin EC 81 MG tablet Take 81 mg by mouth daily.    Marland Kitchen aspirin-acetaminophen-caffeine (EXCEDRIN MIGRAINE) 250-250-65 MG tablet Take 1 tablet by mouth daily as needed for headache.    . clonazePAM (KLONOPIN) 1 MG tablet Take 1 mg by mouth at bedtime.    Marland Kitchen  estradiol (ESTRACE) 1 MG tablet Take 1 mg by mouth daily.     Marland Kitchen FLUoxetine (PROZAC) 40 MG capsule Take 1 capsule (40 mg total) by mouth daily. (Patient taking differently: Take 40 mg by mouth every morning. ) 90 capsule 0  . guaiFENesin-codeine 100-10 MG/5ML syrup Take 10 mLs by mouth 3 (three) times daily as needed for cough. 120 mL 0  . hydrOXYzine (VISTARIL) 25 MG capsule TAKE 1 CAPSULE(25 MG) BY MOUTH TWICE DAILY AS NEEDED FOR SEVERE ANXIETY OR SYMPTOMS (Patient taking differently: Take 25 mg by mouth 2 (two) times a day. ) 60 capsule 1  . nystatin cream (MYCOSTATIN) Apply 1 application topically 2 (two) times daily. (Patient taking differently: Apply 1 application topically 2 (two) times daily as needed (rash). ) 30 g 0  . omeprazole (PRILOSEC) 40 MG capsule Take 1 capsule (40 mg total) by mouth daily. (Patient taking differently: Take 40 mg by mouth every morning. ) 90 capsule 1  . oxyCODONE-acetaminophen (PERCOCET) 7.5-325 MG tablet Take 1 tablet by mouth every 8 (eight) hours as needed for severe pain. 60 tablet 0  . OXYGEN Inhale 2 L into the lungs at bedtime.    . predniSONE (DELTASONE) 20 MG tablet Take 1 tablet (20 mg total) by mouth daily. 5 tablet 0  . pregabalin (LYRICA) 50 MG capsule Take 1 capsule (50 mg total) by mouth 3 (three) times daily. 90 capsule 2  . QUEtiapine  (SEROQUEL) 25 MG tablet Take 25 mg by mouth at bedtime.    . Suvorexant (BELSOMRA) 20 MG TABS Take 20 mg by mouth at bedtime. 30 tablet 2  . traMADol (ULTRAM) 50 MG tablet Take 1 tablet (50 mg total) by mouth every 6 (six) hours as needed. 90 tablet 0  . traZODone (DESYREL) 150 MG tablet Take 150 mg by mouth at bedtime.     No current facility-administered medications for this visit.    Facility-Administered Medications Ordered in Other Visits  Medication Dose Route Frequency Provider Last Rate Last Dose  . ipratropium-albuterol (DUONEB) 0.5-2.5 (3) MG/3ML nebulizer solution 3 mL  3 mL Nebulization Q6H Burns, Wandra Feinstein, NP          Review of Systems A complete review of systems was asked and was negative except for the following positive findings easy bruising, shortness of breath with exertion, oxygen dependent at night  Blood pressure 120/83, pulse 70, temperature (!) 97.3 F (36.3 C), temperature source Skin, height 5\' 5"  (1.651 m), weight 196 lb (88.9 kg), SpO2 95 %.  Physical Exam CONSTITUTIONAL:  Pleasant, well-developed, well-nourished, and in no acute distress. EYES: Pupils equal and reactive to light, Sclera non-icteric EARS, NOSE, MOUTH AND THROAT:  The oropharynx was clear.  Dentition is absent.  Oral mucosa pink and moist. LYMPH NODES:  Lymph nodes in the neck and axillae were normal RESPIRATORY:  Lungs were clear.  Normal respiratory effort without pathologic use of accessory muscles of respiration CARDIOVASCULAR: Heart was regular without murmurs.  There were no carotid bruits. GI: The abdomen was soft, nontender, and nondistended. There were no palpable masses. There was no hepatosplenomegaly. There were normal bowel sounds in all quadrants. GU:  Rectal deferred.   MUSCULOSKELETAL:  Normal muscle strength and tone.  No clubbing or cyanosis.   SKIN:  There were no pathologic skin lesions.  There were no nodules on palpation. NEUROLOGIC:  Sensation is normal.  Cranial  nerves are grossly intact. PSYCH:  Oriented to person, place and time.  Mood and affect  are normal.  Data Reviewed CT scans  I have personally reviewed the patient's imaging, laboratory findings and medical records.    Assessment    I have independently reviewed the patient's CT scan and her PET scan.  There is infrahilar and hilar adenopathy consistent with recurrence of her carcinoma.  I have also reviewed her pulmonary function studies from last year.  Her FEV1 was approximately 90 and her DLCO was approximately 75.  She asked regarding the possibility of performing additional surgery.  Given her poor functional status and oxygen requirements at night I did not think that this would be possible but I did explain to her that I would ask 1 of my colleagues at [facility] what their opinion might be.  She is agreeable to that.    Plan    I have performed her preoperative evaluation today and all of her orders are in place for her Port-A-Cath insertion.  Risks of bleeding, infection, pneumothorax and death were all reviewed.  She understands would like [place] to proceed.  She will hold her aspirin.       Nestor Lewandowsky, MD 03/31/2019, 10:21 AM

## 2019-03-31 NOTE — Progress Notes (Signed)
Patient ID: Susan Houston, female   DOB: 02/21/1961, 58 y.o.   MRN: [id number]  Chief Complaint  Patient presents with  . Other    Referred By Dr. Iris Pert Monday Reason for Referral Port-A-Cath insertion  HPI Location, Quality, Duration, Severity, Timing, Context, Modifying Factors, Associated Signs and Symptoms.  Susan Houston is a 58 y.o. female.  She had previously undergone a muscle-sparing right thoracotomy about a year ago for a right lower lobe peripheral lung nodule.  She had a prior history of head neck cancer and the frozen section in the operating room confirmed a squamous cell carcinoma.  Although it was unclear whether or not this was a primary or metastatic lesion she did well for the subsequent year but on her most recent scan she has developed hilar and infrahilar adenopathy consistent with recurrence.  She did undergo a repeat bronchoscopy which confirmed the presence of metastatic lung cancer.  She presents now for consideration of Port-A-Cath insertion.   Past Medical History:  Diagnosis Date  . Anemia    vitamin b12 deficiency. no longer taking supplements  . Anxiety   . Arthritis   . Asthma   . Back pain   . Bruises easily   . Chronic leg pain    BILATERAL  . COPD (chronic obstructive pulmonary disease) (HCC)    no inhalers for over 1 year  . Depression   . Difficulty sleeping   . Diverticulitis   . Dyspnea    due to having part of lung removed in 2019  . Dysrhythmia 04/2018   brady episodes. cleared by dr. Clayborn Bigness  . Facet syndrome, lumbar 03/28/2015  . GERD (gastroesophageal reflux disease)   . Hemorrhoids   . History of kidney stones 04/2018   recently passed stone  . Hyperlipidemia   . Incontinence of urine   . Laryngeal cancer ([place]) 2009   LARYNX CANCER - CHEMO / RADIATION (NO SURGERY)   . Migraine   . Migraine without aura and without status migrainosus, not intractable 09/11/2016  . Mild cognitive impairment 11/23/2017  . MRSA infection greater than 3  months ago    2008 right side of face  . Nerve damage    right leg. thinks this is from her TKR  . Nodule of lower lobe of right lung 04/22/2018  . Oxygen deficiency    2L/HS  . Primary cancer of right lower lobe of lung ([place]) 04/18/2018   Surgical resection of RLL  . Wears dentures    full upper and lower    Past Surgical History:  Procedure Laterality Date  . ABDOMINAL HYSTERECTOMY  1983  . BLADDER SUSPENSION  2014  . CATARACT EXTRACTION W/PHACO Left 05/11/2018   Procedure: CATARACT EXTRACTION PHACO AND INTRAOCULAR LENS PLACEMENT ([place]) LEFT;  Surgeon: Leandrew Koyanagi, MD;  Location: [facility];  Service: Ophthalmology;  Laterality: Left;  PREFERS EARLY  . CATARACT EXTRACTION W/PHACO Right 08/11/2018   Procedure: CATARACT EXTRACTION PHACO AND INTRAOCULAR LENS PLACEMENT ([facility]);  Surgeon: Leandrew Koyanagi, MD;  Location: [facility];  Service: Ophthalmology;  Laterality: Right;  CDE 5:02 Total Time 01:35 Fluid lot # [id number]  . CHOLECYSTECTOMY  1998  . COLONOSCOPY    . ENDOBRONCHIAL ULTRASOUND N/A 03/20/2019   Procedure: ENDOBRONCHIAL ULTRASOUND;  Surgeon: Flora Lipps, MD;  Location: [facility];  Service: Cardiopulmonary;  Laterality: N/A;  . ESOPHAGOGASTRODUODENOSCOPY  2015  . ESOPHAGOGASTRODUODENOSCOPY (EGD) WITH PROPOFOL N/A 11/20/2016   Procedure: ESOPHAGOGASTRODUODENOSCOPY (EGD) WITH PROPOFOL;  Surgeon: Jonathon Bellows, MD;  Location: [facility];  Service: Endoscopy;  Laterality: N/A;  . EXCISION NEUROMA Bilateral 09/24/2014   Procedure: BILATERAL OPEN SAPHENOUS NEURECTOMIES AND OPEN LEFT PERIPATELLA OSETOPHYTECTOMY;  Surgeon: Mauri Pole, MD;  [facility];  Service: Orthopedics;  Laterality: Bilateral;  . EYE SURGERY Left 05/11/2018   cataract extraction  . JOINT REPLACEMENT  2009/2010   BIL TOTAL KNEES  . JOINT REPLACEMENT  2020   left  . THORACOTOMY/LOBECTOMY Right 05/16/2018   Procedure: THORACOTOMY/POSSIBLE LOBECTOMY;  Surgeon: Nestor Lewandowsky, MD;  Location:  [facility];  Service: Thoracic;  Laterality: Right;  . TONSILLECTOMY    . TOTAL KNEE REVISION Left 11/29/2018   Procedure: Left TOTAL KNEE REVISION;  Surgeon: Hessie Knows, MD;  Location: [facility];  Service: Orthopedics;  Laterality: Left;  Marland Kitchen VIDEO BRONCHOSCOPY N/A 05/16/2018   Procedure: PREOP  BRONCHOSCOPY;  Surgeon: Nestor Lewandowsky, MD;  Location: [facility];  Service: Thoracic;  Laterality: N/A;    Family History  Problem Relation Age of Onset  . Asthma Mother   . Diabetes Mother   . Hyperlipidemia Mother   . Hypertension Mother   . Cirrhosis Mother        Non-alcoholic  . Arthritis Father   . Cancer Father        Bone  . Hyperlipidemia Father   . Hypertension Father   . Anxiety disorder Sister   . Depression Sister   . Kidney Stones Son   . Stroke Maternal Grandmother   . Breast cancer Maternal Grandmother 85  . Cancer Maternal Grandfather        lung  . Pneumonia Paternal Grandmother   . Alzheimer's disease Paternal Grandfather   . Diabetes Sister   . Gout Sister   . Hypertension Sister   . Alcohol abuse Brother   . Heart disease Brother        massive MI    Social History Social History   Tobacco Use  . Smoking status: Former Smoker    Packs/day: 0.50    Types: Cigarettes    Quit date: 04/07/2016    Years since quitting: 2.9  . Smokeless tobacco: Never Used  . Tobacco comment: patient has not smoked x 10 days.06/28/17 chantix  Substance Use Topics  . Alcohol use: No    Alcohol/week: 0.0 standard drinks  . Drug use: No    Allergies  Allergen Reactions  . Morphine And Related Itching    Can tolerate with benadryl  . Cymbalta [Duloxetine Hcl] Other (See Comments)    Pt states it keeps her awake  . Adhesive [Tape] Other (See Comments)    Plastic tape rips skin and bruises her. PLEASE USE PAPER TAPE  . Lexapro [Escitalopram Oxalate] Other (See Comments)    Keeps awake for days.     Current Outpatient Medications  Medication Sig Dispense Refill  .  albuterol (PROVENTIL HFA;VENTOLIN HFA) 108 (90 Base) MCG/ACT inhaler Inhale 2 puffs into the lungs every 4 (four) hours as needed for wheezing or shortness of breath. 1 Inhaler 2  . albuterol (PROVENTIL) (2.5 MG/3ML) 0.083% nebulizer solution Take 3 mLs (2.5 mg total) by nebulization every 4 (four) hours as needed for wheezing or shortness of breath. 75 mL 12  . aspirin EC 81 MG tablet Take 81 mg by mouth daily.    Marland Kitchen aspirin-acetaminophen-caffeine (EXCEDRIN MIGRAINE) 250-250-65 MG tablet Take 1 tablet by mouth daily as needed for headache.    . clonazePAM (KLONOPIN) 1 MG tablet Take 1 mg by mouth at bedtime.    Marland Kitchen  estradiol (ESTRACE) 1 MG tablet Take 1 mg by mouth daily.     Marland Kitchen FLUoxetine (PROZAC) 40 MG capsule Take 1 capsule (40 mg total) by mouth daily. (Patient taking differently: Take 40 mg by mouth every morning. ) 90 capsule 0  . guaiFENesin-codeine 100-10 MG/5ML syrup Take 10 mLs by mouth 3 (three) times daily as needed for cough. 120 mL 0  . hydrOXYzine (VISTARIL) 25 MG capsule TAKE 1 CAPSULE(25 MG) BY MOUTH TWICE DAILY AS NEEDED FOR SEVERE ANXIETY OR SYMPTOMS (Patient taking differently: Take 25 mg by mouth 2 (two) times a day. ) 60 capsule 1  . nystatin cream (MYCOSTATIN) Apply 1 application topically 2 (two) times daily. (Patient taking differently: Apply 1 application topically 2 (two) times daily as needed (rash). ) 30 g 0  . omeprazole (PRILOSEC) 40 MG capsule Take 1 capsule (40 mg total) by mouth daily. (Patient taking differently: Take 40 mg by mouth every morning. ) 90 capsule 1  . oxyCODONE-acetaminophen (PERCOCET) 7.5-325 MG tablet Take 1 tablet by mouth every 8 (eight) hours as needed for severe pain. 60 tablet 0  . OXYGEN Inhale 2 L into the lungs at bedtime.    . predniSONE (DELTASONE) 20 MG tablet Take 1 tablet (20 mg total) by mouth daily. 5 tablet 0  . pregabalin (LYRICA) 50 MG capsule Take 1 capsule (50 mg total) by mouth 3 (three) times daily. 90 capsule 2  . QUEtiapine  (SEROQUEL) 25 MG tablet Take 25 mg by mouth at bedtime.    . Suvorexant (BELSOMRA) 20 MG TABS Take 20 mg by mouth at bedtime. 30 tablet 2  . traMADol (ULTRAM) 50 MG tablet Take 1 tablet (50 mg total) by mouth every 6 (six) hours as needed. 90 tablet 0  . traZODone (DESYREL) 150 MG tablet Take 150 mg by mouth at bedtime.     No current facility-administered medications for this visit.    Facility-Administered Medications Ordered in Other Visits  Medication Dose Route Frequency Provider Last Rate Last Dose  . ipratropium-albuterol (DUONEB) 0.5-2.5 (3) MG/3ML nebulizer solution 3 mL  3 mL Nebulization Q6H Burns, Wandra Feinstein, NP          Review of Systems A complete review of systems was asked and was negative except for the following positive findings easy bruising, shortness of breath with exertion, oxygen dependent at night  Blood pressure 120/83, pulse 70, temperature (!) 97.3 F (36.3 C), temperature source Skin, height 5\' 5"  (1.651 m), weight 196 lb (88.9 kg), SpO2 95 %.  Physical Exam CONSTITUTIONAL:  Pleasant, well-developed, well-nourished, and in no acute distress. EYES: Pupils equal and reactive to light, Sclera non-icteric EARS, NOSE, MOUTH AND THROAT:  The oropharynx was clear.  Dentition is absent.  Oral mucosa pink and moist. LYMPH NODES:  Lymph nodes in the neck and axillae were normal RESPIRATORY:  Lungs were clear.  Normal respiratory effort without pathologic use of accessory muscles of respiration CARDIOVASCULAR: Heart was regular without murmurs.  There were no carotid bruits. GI: The abdomen was soft, nontender, and nondistended. There were no palpable masses. There was no hepatosplenomegaly. There were normal bowel sounds in all quadrants. GU:  Rectal deferred.   MUSCULOSKELETAL:  Normal muscle strength and tone.  No clubbing or cyanosis.   SKIN:  There were no pathologic skin lesions.  There were no nodules on palpation. NEUROLOGIC:  Sensation is normal.  Cranial  nerves are grossly intact. PSYCH:  Oriented to person, place and time.  Mood and affect  are normal.  Data Reviewed CT scans  I have personally reviewed the patient's imaging, laboratory findings and medical records.    Assessment    I have independently reviewed the patient's CT scan and her PET scan.  There is infrahilar and hilar adenopathy consistent with recurrence of her carcinoma.  I have also reviewed her pulmonary function studies from last year.  Her FEV1 was approximately 90 and her DLCO was approximately 75.  She asked regarding the possibility of performing additional surgery.  Given her poor functional status and oxygen requirements at night I did not think that this would be possible but I did explain to her that I would ask 1 of my colleagues at [facility] what their opinion might be.  She is agreeable to that.    Plan    I have performed her preoperative evaluation today and all of her orders are in place for her Port-A-Cath insertion.  Risks of bleeding, infection, pneumothorax and death were all reviewed.  She understands would like [place] to proceed.  She will hold her aspirin.       Nestor Lewandowsky, MD 03/31/2019, 10:21 AM

## 2019-04-03 ENCOUNTER — Telehealth: Payer: Self-pay | Admitting: *Deleted

## 2019-04-03 ENCOUNTER — Ambulatory Visit
Admission: RE | Admit: 2019-04-03 | Discharge: 2019-04-03 | Disposition: A | Discharge status: Home / Self Care | Payer: Medicare Other | Source: Ambulatory Visit | Attending: Radiation Oncology | Admitting: Radiation Oncology

## 2019-04-03 ENCOUNTER — Encounter
Admission: RE | Admit: 2019-04-03 | Discharge: 2019-04-03 | Disposition: A | Discharge status: Home / Self Care | Payer: Medicare Other | Source: Ambulatory Visit | Attending: Cardiothoracic Surgery | Admitting: Cardiothoracic Surgery

## 2019-04-03 ENCOUNTER — Other Ambulatory Visit: Payer: Self-pay

## 2019-04-03 ENCOUNTER — Inpatient Hospital Stay: Payer: Medicare Other

## 2019-04-03 DIAGNOSIS — Z510 Encounter for antineoplastic radiation therapy: Secondary | ICD-10-CM | POA: Insufficient documentation

## 2019-04-03 DIAGNOSIS — C3431 Malignant neoplasm of lower lobe, right bronchus or lung: Secondary | ICD-10-CM | POA: Diagnosis not present

## 2019-04-03 DIAGNOSIS — Z1159 Encounter for screening for other viral diseases: Secondary | ICD-10-CM | POA: Insufficient documentation

## 2019-04-03 DIAGNOSIS — Z87891 Personal history of nicotine dependence: Secondary | ICD-10-CM | POA: Insufficient documentation

## 2019-04-03 DIAGNOSIS — C771 Secondary and unspecified malignant neoplasm of intrathoracic lymph nodes: Secondary | ICD-10-CM | POA: Diagnosis not present

## 2019-04-03 DIAGNOSIS — R918 Other nonspecific abnormal finding of lung field: Secondary | ICD-10-CM | POA: Insufficient documentation

## 2019-04-03 DIAGNOSIS — Z01812 Encounter for preprocedural laboratory examination: Secondary | ICD-10-CM | POA: Insufficient documentation

## 2019-04-03 LAB — NOVEL CORONAVIRUS, NAA (HOSP ORDER, SEND-OUT TO REF LAB; TAT 18-24 HRS): SARS-CoV-2, NAA: NOT DETECTED

## 2019-04-03 NOTE — Telephone Encounter (Signed)
Message left for patient to call the office.   I wanted to follow up with her in regards to surgery that is scheduled for Wednesday, 04-05-19 at [facility] with Dr. Genevive Bi.   The patient previously had COVID-19 testing done on 03-31-19 at the [facility] drive thru ([address] [place]).  The patient needs to be notified she will be contacted by the [facility] to complete a phone interview later today (per Caryl Pina in [employer]).  Patient to be notified to be NPO after midnight and have a driver.   She will need to check in at the [facility] entrance where she will be screened for the coronavirus and then sent to [facility].   Patient to be notified that she may have no visitors and driver will need to wait in the car due to COVID-19 restrictions.

## 2019-04-03 NOTE — Patient Instructions (Addendum)
Your procedure is scheduled on: 04-05-19  Report to Same Day Surgery 2nd floor medical mall [facility] Entrance-take elevator on left to 2nd floor.  Check in with surgery information desk.) To find out your arrival time please call [phone] between 1PM - 3PM on 04-04-19  Remember: Instructions that are not followed completely may result in serious medical risk, up to and including death, or upon the discretion of your surgeon and anesthesiologist your surgery may need to be rescheduled.    _x___ 1. Do not eat food after midnight the night before your procedure. You may drink clear liquids up to 2 hours before you are scheduled to arrive at the hospital for your procedure.  Do not drink clear liquids within 2 hours of your scheduled arrival to the hospital.  Clear liquids include  --Water or Apple juice without pulp  --Clear carbohydrate beverage such as ClearFast or Gatorade  --Black Coffee or Clear Tea (No milk, no creamers, do not add anything to the coffee or Tea   ____Ensure clear carbohydrate drink on the way to the hospital for bariatric patients  ____Ensure clear carbohydrate drink 3 hours before surgery for Dr Dwyane Luo patients if physician instructed.   No gum chewing or hard candies.     __x__ 2. No Alcohol for 24 hours before or after surgery.   __x__3. No Smoking or e-cigarettes for 24 prior to surgery.  Do not use any chewable tobacco products for at least 6 hour prior to surgery   ____  4. Bring all medications with you on the day of surgery if instructed.    __x__ 5. Notify your doctor if there is any change in your medical condition     (cold, fever, infections).    x___6. On the morning of surgery brush your teeth with toothpaste and water.  You may rinse your mouth with mouth wash if you wish.  Do not swallow any toothpaste or mouthwash.   Do not wear jewelry, make-up, hairpins, clips or nail polish.  Do not wear lotions, powders, or perfumes. You may wear  deodorant.  Do not shave 48 hours prior to surgery. Men may shave face and neck.  Do not bring valuables to the hospital.    [facility] is not responsible for any belongings or valuables.               Contacts, dentures or bridgework may not be worn into surgery.  Leave your suitcase in the car. After surgery it may be brought to your room.  For patients admitted to the hospital, discharge time is determined by your  treatment team.  _  Patients discharged the day of surgery will not be allowed to drive home.  You will need someone to drive you home and stay with you the night of your procedure.    Please read over the following fact sheets that you were given:   [facility] Preparing for Surgery   _x___ TAKE THE FOLLOWING MEDICATION THE MORNING OF SURGERY WITH A SMALL SIP OF WATER. These include:  1. PROZAC  2. HYDRALAZINE  3. LYRICA  4. PRILOSEC  5. TAKE AN EXTRA PRILOSEC THE NIGHT BEFORE YOUR SURGERY  6.  ____Fleets enema or Magnesium Citrate as directed.   ____ Use CHG Soap or sage wipes as directed on instruction sheet   _X___ Use inhalers on the day of surgery and bring to hospital day of surgery-USE ALBUTEROL NEBULIZER AM OF SURGERY AND BRING ALBUTEROL [place]  ____ Stop Metformin and Janumet 2 days prior to surgery.    ____ Take 1/2 of usual insulin dose the night before surgery and none on the morning surgery.   _x___ Follow recommendations from Cardiologist, Pulmonologist or PCP regarding stopping Aspirin, Coumadin, Plavix ,Eliquis, Effient, or Pradaxa, and Pletal-PT STATES SHE STOPPED ASA LAST TUESDAY  X____Stop Anti-inflammatories such as Advil, Aleve, Ibuprofen, Motrin, Naproxen, Naprosyn, Goodies powders, EXCEDRIN MIGRAINE or aspirin products NOW-OK to take Tylenol    ____ Stop supplements until after surgery.     ____ Bring C-Pap to the hospital.

## 2019-04-04 DIAGNOSIS — Z510 Encounter for antineoplastic radiation therapy: Secondary | ICD-10-CM | POA: Diagnosis not present

## 2019-04-04 DIAGNOSIS — C771 Secondary and unspecified malignant neoplasm of intrathoracic lymph nodes: Secondary | ICD-10-CM | POA: Diagnosis not present

## 2019-04-04 DIAGNOSIS — Z87891 Personal history of nicotine dependence: Secondary | ICD-10-CM | POA: Diagnosis not present

## 2019-04-04 DIAGNOSIS — C3431 Malignant neoplasm of lower lobe, right bronchus or lung: Secondary | ICD-10-CM | POA: Diagnosis not present

## 2019-04-04 MED ORDER — CEFAZOLIN SODIUM-DEXTROSE 2-4 GM/100ML-% IV SOLN: 2.0000 g | INTRAVENOUS | Status: DC

## 2019-04-04 NOTE — Telephone Encounter (Signed)
Called and spoke with the patient and reviewed all instructions. She had spoken with Pre Admit testing yesterday and is aware of all instructions.

## 2019-04-05 ENCOUNTER — Ambulatory Visit: Payer: Medicare Other | Admitting: Anesthesiology

## 2019-04-05 ENCOUNTER — Ambulatory Visit (INDEPENDENT_AMBULATORY_CARE_PROVIDER_SITE_OTHER): Payer: Medicare Other | Admitting: Psychiatry

## 2019-04-05 ENCOUNTER — Ambulatory Visit
Admission: RE | Admit: 2019-04-05 | Discharge: 2019-04-05 | Disposition: A | Discharge status: Home / Self Care | Payer: Medicare Other | Attending: Cardiothoracic Surgery | Admitting: Cardiothoracic Surgery

## 2019-04-05 ENCOUNTER — Other Ambulatory Visit: Payer: Self-pay

## 2019-04-05 ENCOUNTER — Ambulatory Visit
Admission: RE | Admit: 2019-04-05 | Discharge: 2019-04-05 | Disposition: A | Discharge status: Home / Self Care | Payer: Medicare Other | Source: Home / Self Care | Attending: Cardiothoracic Surgery | Admitting: Cardiothoracic Surgery

## 2019-04-05 ENCOUNTER — Encounter: Payer: Self-pay | Admitting: Psychiatry

## 2019-04-05 ENCOUNTER — Ambulatory Visit: Payer: Medicare Other

## 2019-04-05 ENCOUNTER — Encounter: Payer: Self-pay | Admitting: Anesthesiology

## 2019-04-05 ENCOUNTER — Encounter
Admission: RE | Disposition: A | Discharge status: Home / Self Care | Payer: Self-pay | Source: Home / Self Care | Attending: Cardiothoracic Surgery

## 2019-04-05 DIAGNOSIS — G709 Myoneural disorder, unspecified: Secondary | ICD-10-CM | POA: Insufficient documentation

## 2019-04-05 DIAGNOSIS — Z87891 Personal history of nicotine dependence: Secondary | ICD-10-CM | POA: Insufficient documentation

## 2019-04-05 DIAGNOSIS — M1990 Unspecified osteoarthritis, unspecified site: Secondary | ICD-10-CM | POA: Insufficient documentation

## 2019-04-05 DIAGNOSIS — J449 Chronic obstructive pulmonary disease, unspecified: Secondary | ICD-10-CM | POA: Insufficient documentation

## 2019-04-05 DIAGNOSIS — Z1159 Encounter for screening for other viral diseases: Secondary | ICD-10-CM | POA: Insufficient documentation

## 2019-04-05 DIAGNOSIS — G8929 Other chronic pain: Secondary | ICD-10-CM | POA: Insufficient documentation

## 2019-04-05 DIAGNOSIS — F5101 Primary insomnia: Secondary | ICD-10-CM

## 2019-04-05 DIAGNOSIS — Z9842 Cataract extraction status, left eye: Secondary | ICD-10-CM | POA: Diagnosis not present

## 2019-04-05 DIAGNOSIS — F329 Major depressive disorder, single episode, unspecified: Secondary | ICD-10-CM | POA: Insufficient documentation

## 2019-04-05 DIAGNOSIS — R918 Other nonspecific abnormal finding of lung field: Secondary | ICD-10-CM

## 2019-04-05 DIAGNOSIS — R911 Solitary pulmonary nodule: Secondary | ICD-10-CM | POA: Insufficient documentation

## 2019-04-05 DIAGNOSIS — Z452 Encounter for adjustment and management of vascular access device: Secondary | ICD-10-CM | POA: Diagnosis not present

## 2019-04-05 DIAGNOSIS — Z803 Family history of malignant neoplasm of breast: Secondary | ICD-10-CM | POA: Insufficient documentation

## 2019-04-05 DIAGNOSIS — Z7982 Long term (current) use of aspirin: Secondary | ICD-10-CM | POA: Insufficient documentation

## 2019-04-05 DIAGNOSIS — Z85118 Personal history of other malignant neoplasm of bronchus and lung: Secondary | ICD-10-CM | POA: Diagnosis not present

## 2019-04-05 DIAGNOSIS — Z8379 Family history of other diseases of the digestive system: Secondary | ICD-10-CM | POA: Insufficient documentation

## 2019-04-05 DIAGNOSIS — Z923 Personal history of irradiation: Secondary | ICD-10-CM | POA: Insufficient documentation

## 2019-04-05 DIAGNOSIS — C3401 Malignant neoplasm of right main bronchus: Secondary | ICD-10-CM

## 2019-04-05 DIAGNOSIS — Z9049 Acquired absence of other specified parts of digestive tract: Secondary | ICD-10-CM | POA: Insufficient documentation

## 2019-04-05 DIAGNOSIS — Z811 Family history of alcohol abuse and dependence: Secondary | ICD-10-CM | POA: Insufficient documentation

## 2019-04-05 DIAGNOSIS — Z91048 Other nonmedicinal substance allergy status: Secondary | ICD-10-CM | POA: Insufficient documentation

## 2019-04-05 DIAGNOSIS — Z8521 Personal history of malignant neoplasm of larynx: Secondary | ICD-10-CM | POA: Diagnosis not present

## 2019-04-05 DIAGNOSIS — G479 Sleep disorder, unspecified: Secondary | ICD-10-CM | POA: Insufficient documentation

## 2019-04-05 DIAGNOSIS — M79605 Pain in left leg: Secondary | ICD-10-CM | POA: Insufficient documentation

## 2019-04-05 DIAGNOSIS — F419 Anxiety disorder, unspecified: Secondary | ICD-10-CM | POA: Insufficient documentation

## 2019-04-05 DIAGNOSIS — K219 Gastro-esophageal reflux disease without esophagitis: Secondary | ICD-10-CM | POA: Insufficient documentation

## 2019-04-05 DIAGNOSIS — Z90710 Acquired absence of both cervix and uterus: Secondary | ICD-10-CM | POA: Insufficient documentation

## 2019-04-05 DIAGNOSIS — F331 Major depressive disorder, recurrent, moderate: Secondary | ICD-10-CM | POA: Diagnosis not present

## 2019-04-05 DIAGNOSIS — G43909 Migraine, unspecified, not intractable, without status migrainosus: Secondary | ICD-10-CM | POA: Diagnosis not present

## 2019-04-05 DIAGNOSIS — Z833 Family history of diabetes mellitus: Secondary | ICD-10-CM | POA: Insufficient documentation

## 2019-04-05 DIAGNOSIS — Z885 Allergy status to narcotic agent status: Secondary | ICD-10-CM | POA: Insufficient documentation

## 2019-04-05 DIAGNOSIS — G3184 Mild cognitive impairment, so stated: Secondary | ICD-10-CM

## 2019-04-05 DIAGNOSIS — Z888 Allergy status to other drugs, medicaments and biological substances status: Secondary | ICD-10-CM | POA: Insufficient documentation

## 2019-04-05 DIAGNOSIS — M79604 Pain in right leg: Secondary | ICD-10-CM | POA: Insufficient documentation

## 2019-04-05 DIAGNOSIS — Z87442 Personal history of urinary calculi: Secondary | ICD-10-CM | POA: Insufficient documentation

## 2019-04-05 DIAGNOSIS — Z801 Family history of malignant neoplasm of trachea, bronchus and lung: Secondary | ICD-10-CM | POA: Insufficient documentation

## 2019-04-05 DIAGNOSIS — Z9221 Personal history of antineoplastic chemotherapy: Secondary | ICD-10-CM | POA: Diagnosis not present

## 2019-04-05 DIAGNOSIS — Z9841 Cataract extraction status, right eye: Secondary | ICD-10-CM | POA: Insufficient documentation

## 2019-04-05 DIAGNOSIS — F411 Generalized anxiety disorder: Secondary | ICD-10-CM | POA: Diagnosis not present

## 2019-04-05 DIAGNOSIS — Z820 Family history of epilepsy and other diseases of the nervous system: Secondary | ICD-10-CM | POA: Insufficient documentation

## 2019-04-05 DIAGNOSIS — E785 Hyperlipidemia, unspecified: Secondary | ICD-10-CM | POA: Diagnosis not present

## 2019-04-05 DIAGNOSIS — Z8349 Family history of other endocrine, nutritional and metabolic diseases: Secondary | ICD-10-CM | POA: Insufficient documentation

## 2019-04-05 DIAGNOSIS — Z808 Family history of malignant neoplasm of other organs or systems: Secondary | ICD-10-CM | POA: Insufficient documentation

## 2019-04-05 DIAGNOSIS — Z96653 Presence of artificial knee joint, bilateral: Secondary | ICD-10-CM | POA: Insufficient documentation

## 2019-04-05 DIAGNOSIS — Z818 Family history of other mental and behavioral disorders: Secondary | ICD-10-CM | POA: Insufficient documentation

## 2019-04-05 DIAGNOSIS — D649 Anemia, unspecified: Secondary | ICD-10-CM | POA: Insufficient documentation

## 2019-04-05 DIAGNOSIS — Z7951 Long term (current) use of inhaled steroids: Secondary | ICD-10-CM | POA: Insufficient documentation

## 2019-04-05 DIAGNOSIS — Z8249 Family history of ischemic heart disease and other diseases of the circulatory system: Secondary | ICD-10-CM | POA: Insufficient documentation

## 2019-04-05 DIAGNOSIS — Z8261 Family history of arthritis: Secondary | ICD-10-CM | POA: Insufficient documentation

## 2019-04-05 DIAGNOSIS — Z825 Family history of asthma and other chronic lower respiratory diseases: Secondary | ICD-10-CM | POA: Insufficient documentation

## 2019-04-05 DIAGNOSIS — C3431 Malignant neoplasm of lower lobe, right bronchus or lung: Secondary | ICD-10-CM | POA: Diagnosis not present

## 2019-04-05 DIAGNOSIS — Z79899 Other long term (current) drug therapy: Secondary | ICD-10-CM | POA: Insufficient documentation

## 2019-04-05 HISTORY — PX: PORTACATH PLACEMENT: SHX2246

## 2019-04-05 SURGERY — INSERTION, TUNNELED CENTRAL VENOUS DEVICE, WITH PORT
Anesthesia: General

## 2019-04-05 MED ORDER — LIDOCAINE HCL URETHRAL/MUCOSAL 2 % EX GEL
CUTANEOUS | Status: AC
  Filled 2019-04-05: qty 5

## 2019-04-05 MED ORDER — CLONAZEPAM 0.5 MG PO TABS: 0.2500 mg | ORAL_TABLET | Freq: Every day | ORAL | 1 refills | Status: DC | PRN

## 2019-04-05 MED ORDER — LIDOCAINE HCL (PF) 1 % IJ SOLN
INTRAMUSCULAR | Status: DC | PRN
  Administered 2019-04-05: 19 mL

## 2019-04-05 MED ORDER — GLYCOPYRROLATE 0.2 MG/ML IJ SOLN
INTRAMUSCULAR | Status: AC
  Administered 2019-04-05: 0.2 mg via INTRAVENOUS
  Filled 2019-04-05: qty 1

## 2019-04-05 MED ORDER — HEPARIN SODIUM (PORCINE) 5000 UNIT/ML IJ SOLN
INTRAMUSCULAR | Status: AC
  Filled 2019-04-05: qty 1

## 2019-04-05 MED ORDER — PROPOFOL 10 MG/ML IV BOLUS
INTRAVENOUS | Status: AC
  Filled 2019-04-05: qty 20

## 2019-04-05 MED ORDER — GLYCOPYRROLATE 0.2 MG/ML IJ SOLN
0.2000 mg | Freq: Once | INTRAMUSCULAR | Status: AC
  Administered 2019-04-05: 0.2 mg via INTRAVENOUS

## 2019-04-05 MED ORDER — FENTANYL CITRATE (PF) 100 MCG/2ML IJ SOLN
INTRAMUSCULAR | Status: AC
  Filled 2019-04-05: qty 2

## 2019-04-05 MED ORDER — FENTANYL CITRATE (PF) 100 MCG/2ML IJ SOLN
25.0000 ug | INTRAMUSCULAR | Status: DC | PRN
  Administered 2019-04-05 (×4): 25 ug via INTRAVENOUS

## 2019-04-05 MED ORDER — CHLORHEXIDINE GLUCONATE CLOTH 2 % EX PADS: 6.0000 | MEDICATED_PAD | Freq: Once | CUTANEOUS | Status: DC

## 2019-04-05 MED ORDER — FLUOXETINE HCL 40 MG PO CAPS: 40.0000 mg | ORAL_CAPSULE | Freq: Every day | ORAL | 1 refills | Status: DC

## 2019-04-05 MED ORDER — LIDOCAINE HCL (CARDIAC) PF 100 MG/5ML IV SOSY
PREFILLED_SYRINGE | INTRAVENOUS | Status: DC | PRN
  Administered 2019-04-05: 20 mg via INTRAVENOUS

## 2019-04-05 MED ORDER — PROPOFOL 10 MG/ML IV BOLUS
INTRAVENOUS | Status: DC | PRN
  Administered 2019-04-05: 140 mg via INTRAVENOUS

## 2019-04-05 MED ORDER — ONDANSETRON HCL 4 MG/2ML IJ SOLN
INTRAMUSCULAR | Status: AC
  Administered 2019-04-05: 4 mg via INTRAVENOUS
  Filled 2019-04-05: qty 2

## 2019-04-05 MED ORDER — FENTANYL CITRATE (PF) 100 MCG/2ML IJ SOLN
INTRAMUSCULAR | Status: AC
  Administered 2019-04-05: 25 ug via INTRAVENOUS
  Filled 2019-04-05: qty 2

## 2019-04-05 MED ORDER — DEXAMETHASONE SODIUM PHOSPHATE 10 MG/ML IJ SOLN
INTRAMUSCULAR | Status: DC | PRN
  Administered 2019-04-05: 6 mg via INTRAVENOUS

## 2019-04-05 MED ORDER — CHLORHEXIDINE GLUCONATE CLOTH 2 % EX PADS
6.0000 | MEDICATED_PAD | Freq: Once | CUTANEOUS | Status: AC
  Administered 2019-04-05: 6 via TOPICAL

## 2019-04-05 MED ORDER — GLYCOPYRROLATE 0.2 MG/ML IJ SOLN: 0.1000 mg | Freq: Once | INTRAMUSCULAR | Status: DC

## 2019-04-05 MED ORDER — ONDANSETRON HCL 4 MG/2ML IJ SOLN
4.0000 mg | Freq: Once | INTRAMUSCULAR | Status: AC | PRN
  Administered 2019-04-05: 4 mg via INTRAVENOUS

## 2019-04-05 MED ORDER — OXYCODONE-ACETAMINOPHEN 5-325 MG PO TABS: 1.0000 | ORAL_TABLET | ORAL | 0 refills | Status: DC | PRN

## 2019-04-05 MED ORDER — CEFAZOLIN SODIUM-DEXTROSE 2-4 GM/100ML-% IV SOLN
INTRAVENOUS | Status: AC
  Filled 2019-04-05: qty 100

## 2019-04-05 MED ORDER — ONDANSETRON HCL 4 MG/2ML IJ SOLN
INTRAMUSCULAR | Status: DC | PRN
  Administered 2019-04-05: 4 mg via INTRAVENOUS

## 2019-04-05 MED ORDER — FLUOXETINE HCL 20 MG PO CAPS: 20.0000 mg | ORAL_CAPSULE | Freq: Every day | ORAL | 1 refills | Status: DC

## 2019-04-05 MED ORDER — MIDAZOLAM HCL 2 MG/2ML IJ SOLN
INTRAMUSCULAR | Status: AC
  Filled 2019-04-05: qty 2

## 2019-04-05 MED ORDER — LIDOCAINE HCL (PF) 1 % IJ SOLN
INTRAMUSCULAR | Status: AC
  Filled 2019-04-05: qty 30

## 2019-04-05 MED ORDER — PHENYLEPHRINE HCL (PRESSORS) 10 MG/ML IV SOLN
INTRAVENOUS | Status: DC | PRN
  Administered 2019-04-05 (×3): 100 ug via INTRAVENOUS

## 2019-04-05 MED ORDER — LACTATED RINGERS IV SOLN
INTRAVENOUS | Status: DC
  Administered 2019-04-05: 10:00:00 via INTRAVENOUS
  Administered 2019-04-05: 1000 mL via INTRAVENOUS

## 2019-04-05 MED ORDER — FENTANYL CITRATE (PF) 100 MCG/2ML IJ SOLN
INTRAMUSCULAR | Status: DC | PRN
  Administered 2019-04-05: 25 ug via INTRAVENOUS
  Administered 2019-04-05: 50 ug via INTRAVENOUS
  Administered 2019-04-05: 25 ug via INTRAVENOUS

## 2019-04-05 MED ORDER — MIDAZOLAM HCL 2 MG/2ML IJ SOLN
INTRAMUSCULAR | Status: DC | PRN
  Administered 2019-04-05: 2 mg via INTRAVENOUS

## 2019-04-05 SURGICAL SUPPLY — 41 items
BAG DECANTER FOR FLEXI CONT (MISCELLANEOUS) ×2 IMPLANT
BLADE SURG SZ11 CARB STEEL (BLADE) ×2 IMPLANT
CANISTER SUCT 1200ML W/VALVE (MISCELLANEOUS) ×2 IMPLANT
CHLORAPREP W/TINT 26 (MISCELLANEOUS) ×2 IMPLANT
COVER LIGHT HANDLE STERIS (MISCELLANEOUS) ×4 IMPLANT
DRAPE C-ARM XRAY 36X54 (DRAPES) ×4 IMPLANT
DRSG TEGADERM 2-3/8X2-3/4 SM (GAUZE/BANDAGES/DRESSINGS) ×2 IMPLANT
DRSG TEGADERM 4X4.75 (GAUZE/BANDAGES/DRESSINGS) ×2 IMPLANT
DRSG TELFA 4X3 1S NADH ST (GAUZE/BANDAGES/DRESSINGS) ×2 IMPLANT
ELECT CAUTERY BLADE TIP 2.5 (TIP) ×2
ELECT REM PT RETURN 9FT ADLT (ELECTROSURGICAL) ×2
ELECTRODE CAUTERY BLDE TIP 2.5 (TIP) ×1 IMPLANT
ELECTRODE REM PT RTRN 9FT ADLT (ELECTROSURGICAL) ×1 IMPLANT
GLOVE SURG SYN 7.5  E (GLOVE) ×1
GLOVE SURG SYN 7.5 E (GLOVE) ×1 IMPLANT
GOWN STRL REUS W/ TWL LRG LVL3 (GOWN DISPOSABLE) ×2 IMPLANT
GOWN STRL REUS W/TWL LRG LVL3 (GOWN DISPOSABLE) ×2
IV NS 500ML (IV SOLUTION) ×1
IV NS 500ML BAXH (IV SOLUTION) ×1 IMPLANT
KIT PORT POWER 8FR ISP CVUE (Port) ×2 IMPLANT
KIT TURNOVER KIT A (KITS) ×2 IMPLANT
LABEL OR SOLS (LABEL) ×2 IMPLANT
MARKER SKIN DUAL TIP RULER LAB (MISCELLANEOUS) ×2 IMPLANT
NEEDLE FILTER BLUNT 18X 1/2SAF (NEEDLE) ×1
NEEDLE FILTER BLUNT 18X1 1/2 (NEEDLE) ×1 IMPLANT
NEEDLE HYPO 22GX1.5 SAFETY (NEEDLE) ×4 IMPLANT
NS IRRIG 500ML POUR BTL (IV SOLUTION) ×2 IMPLANT
PACK PORT-A-CATH (MISCELLANEOUS) ×2 IMPLANT
STRIP CLOSURE SKIN 1/4X4 (GAUZE/BANDAGES/DRESSINGS) IMPLANT
SUT ETHILON 4-0 (SUTURE) ×2
SUT ETHILON 4-0 FS2 18XMFL BLK (SUTURE) ×2
SUT PROLENE 2 0 SH DA (SUTURE) ×4 IMPLANT
SUT VIC AB 2-0 SH 27 (SUTURE) ×1
SUT VIC AB 2-0 SH 27XBRD (SUTURE) ×1 IMPLANT
SUT VIC AB 3-0 SH 27 (SUTURE) ×1
SUT VIC AB 3-0 SH 27X BRD (SUTURE) ×1 IMPLANT
SUTURE ETHLN 4-0 FS2 18XMF BLK (SUTURE) ×2 IMPLANT
SYR 10ML SLIP (SYRINGE) ×2 IMPLANT
SYR 3ML LL SCALE MARK (SYRINGE) ×2 IMPLANT
TAPE CLOTH 3X10 WHT NS LF (GAUZE/BANDAGES/DRESSINGS) IMPLANT
TAPE TRANSPORE STRL 2 31045 (GAUZE/BANDAGES/DRESSINGS) ×2 IMPLANT

## 2019-04-05 NOTE — Anesthesia Post-op Follow-up Note (Signed)
Anesthesia QCDR form completed.        

## 2019-04-05 NOTE — Discharge Instructions (Signed)
AMBULATORY SURGERY  °DISCHARGE INSTRUCTIONS ° ° °1) The drugs that you were given will stay in your system until tomorrow so for the next 24 hours you should not: ° °A) Drive an automobile °B) Make any legal decisions °C) Drink any alcoholic beverage ° ° °2) You may resume regular meals tomorrow.  Today it is better to start with liquids and gradually work up to solid foods. ° °You may eat anything you prefer, but it is better to start with liquids, then soup and crackers, and gradually work up to solid foods. ° ° °3) Please notify your doctor immediately if you have any unusual bleeding, trouble breathing, redness and pain at the surgery site, drainage, fever, or pain not relieved by medication. ° ° ° °4) Additional Instructions: ° ° ° ° ° ° ° °Please contact your physician with any problems or Same Day Surgery at [phone], Monday through Friday 6 am to 4 pm, or [facility] [place] Main number at [phone]. °

## 2019-04-05 NOTE — Transfer of Care (Signed)
Immediate Anesthesia Transfer of Care Note  Patient: Susan Houston  Procedure(s) Performed: INSERTION PORT-A-CATH (N/A )  Patient Location: PACU  Anesthesia Type:General  Level of Consciousness: awake, alert  and oriented  Airway & Oxygen Therapy: Patient Spontanous Breathing and Patient connected to face mask oxygen  Post-op Assessment: Report given to RN and Post -op Vital signs reviewed and stable  Post vital signs: Reviewed and stable  Last Vitals:  Vitals Value Taken Time  BP 115/70 04/05/19 1101  Temp 36.3 C 04/05/19 1101  Pulse 57 04/05/19 1106  Resp 16 04/05/19 1106  SpO2 100 % 04/05/19 1106  Vitals shown include unvalidated device data.  Last Pain:  Vitals:   04/05/19 1101  TempSrc:   PainSc: 10-Worst pain ever         Complications: No apparent anesthesia complications

## 2019-04-05 NOTE — Anesthesia Procedure Notes (Signed)
Procedure Name: LMA Insertion Date/Time: 04/05/2019 10:00 AM Performed by: Allean Found, CRNA Pre-anesthesia Checklist: Patient identified, Patient being monitored, Timeout performed, Emergency Drugs available and Suction available Patient Re-evaluated:Patient Re-evaluated prior to induction Oxygen Delivery Method: Circle system utilized Preoxygenation: Pre-oxygenation with 100% oxygen Induction Type: IV induction Ventilation: Mask ventilation without difficulty LMA: LMA inserted LMA Size: 4.0 Tube type: Oral Number of attempts: 1 Placement Confirmation: positive ETCO2 and breath sounds checked- equal and bilateral Tube secured with: Tape Dental Injury: Teeth and Oropharynx as per pre-operative assessment

## 2019-04-05 NOTE — Anesthesia Postprocedure Evaluation (Signed)
Anesthesia Post Note  Patient: Susan Houston  Procedure(s) Performed: INSERTION PORT-A-CATH (N/A )  Patient location during evaluation: PACU Anesthesia Type: General Level of consciousness: awake and alert and oriented Pain management: pain level controlled Vital Signs Assessment: post-procedure vital signs reviewed and stable Respiratory status: spontaneous breathing Cardiovascular status: blood pressure returned to baseline Anesthetic complications: no     Last Vitals:  Vitals:   04/05/19 1216 04/05/19 1241  BP: (!) 144/63 (!) 156/69  Pulse: 60 64  Resp: 16 16  Temp: 36.5 C   SpO2: 100% 99%    Last Pain:  Vitals:   04/05/19 1241  TempSrc:   PainSc: 2                  Remmie Bembenek

## 2019-04-05 NOTE — Progress Notes (Signed)
Virtual Visit via Video Note  I connected with Susan Houston on 04/05/19 at  3:00 PM EDT by a video enabled telemedicine application and verified that I am speaking with the correct person using two identifiers.   I discussed the limitations of evaluation and management by telemedicine and the availability of in person appointments. The patient expressed understanding and agreed to proceed.    I discussed the assessment and treatment plan with the patient. The patient was provided an opportunity to ask questions and all were answered. The patient agreed with the plan and demonstrated an understanding of the instructions.   The patient was advised to call back or seek an in-person evaluation if the symptoms worsen or if the condition fails to improve as anticipated.   Johnson Siding MD OP Progress Note  04/05/2019 5:30 PM Susan Houston  MRN:  [id number]  Chief Complaint:  Chief Complaint    Follow-up     HPI: Susan Houston is a 58 year old Caucasian female, married, unemployed, lives in [place], has a history of MDD, GAD, primary insomnia, MCI, vitamin B12 deficiency, COPD, migraine headaches, degenerative disc disease, tremors, hyperlipidemia, lung cancer, was evaluated by telemedicine today.  Patient reports she is currently receiving chemotherapy for her lung cancer.  She reports she had to go in to place a port today.  She was observed just laying on her bed today.  She appeared to be emaciated and tired.  Patient reports she continues to struggle with depression and anxiety symptoms.  She reports recently she had a panic attack when she felt shortness of breath, racing heart rate and this feeling of impending doom.  She continues to be compliant with her Prozac.  She denies any side effects.  She reports the Belsomra has been helpful with sleep on and off however she continues to struggle with restlessness at night with her sleep.  She currently take Belsomra 20 mg.  Patient denies any suicidality,  homicidality or perceptual disturbances.  Patient appeared to be alert, oriented to person place time and situation.  Reports she has good support system from her children as well as her husband. Visit Diagnosis:    ICD-10-CM   1. MDD (major depressive disorder), recurrent episode, moderate (HCC)  F33.1 FLUoxetine (PROZAC) 40 MG capsule    FLUoxetine (PROZAC) 20 MG capsule  2. Primary insomnia  F51.01 clonazePAM (KLONOPIN) 0.5 MG tablet  3. GAD (generalized anxiety disorder)  F41.1 clonazePAM (KLONOPIN) 0.5 MG tablet    FLUoxetine (PROZAC) 40 MG capsule    FLUoxetine (PROZAC) 20 MG capsule  4. MCI (mild cognitive impairment)  G31.84     Past Psychiatric History: I have reviewed past psychiatric history from my progress note on 01/14/2018.  Past trials of Lexapro, trazodone, Seroquel  Past Medical History:  Past Medical History:  Diagnosis Date  . Anemia    vitamin b12 deficiency. no longer taking supplements  . Anxiety   . Arthritis   . Asthma   . Back pain   . Bruises easily   . Chronic leg pain    BILATERAL  . COPD (chronic obstructive pulmonary disease) (HCC)    no inhalers for over 1 year  . Depression   . Difficulty sleeping   . Diverticulitis   . Dyspnea    due to having part of lung removed in 2019  . Dysrhythmia 04/2018   brady episodes. cleared by dr. Clayborn Bigness  . Facet syndrome, lumbar 03/28/2015  . GERD (gastroesophageal reflux disease)   . Hemorrhoids   .  History of kidney stones 04/2018   recently passed stone  . Hyperlipidemia   . Incontinence of urine   . Laryngeal cancer ([place]) 2009   LARYNX CANCER - CHEMO / RADIATION (NO SURGERY)   . Migraine   . Migraine without aura and without status migrainosus, not intractable 09/11/2016  . Mild cognitive impairment 11/23/2017  . MRSA infection greater than 3 months ago    2008 right side of face  . Nerve damage    right leg. thinks this is from her TKR  . Nodule of lower lobe of right lung 04/22/2018  . Oxygen  deficiency    2L/HS  . Primary cancer of right lower lobe of lung ([place]) 04/18/2018   Surgical resection of RLL  . Wears dentures    full upper and lower    Past Surgical History:  Procedure Laterality Date  . ABDOMINAL HYSTERECTOMY  1983  . BLADDER SUSPENSION  2014  . CATARACT EXTRACTION W/PHACO Left 05/11/2018   Procedure: CATARACT EXTRACTION PHACO AND INTRAOCULAR LENS PLACEMENT ([place]) LEFT;  Surgeon: Leandrew Koyanagi, MD;  Location: [facility];  Service: Ophthalmology;  Laterality: Left;  PREFERS EARLY  . CATARACT EXTRACTION W/PHACO Right 08/11/2018   Procedure: CATARACT EXTRACTION PHACO AND INTRAOCULAR LENS PLACEMENT ([facility]);  Surgeon: Leandrew Koyanagi, MD;  Location: [facility];  Service: Ophthalmology;  Laterality: Right;  CDE 5:02 Total Time 01:35 Fluid lot # [id number]  . CHOLECYSTECTOMY  1998  . COLONOSCOPY    . ENDOBRONCHIAL ULTRASOUND N/A 03/20/2019   Procedure: ENDOBRONCHIAL ULTRASOUND;  Surgeon: Flora Lipps, MD;  Location: [facility];  Service: Cardiopulmonary;  Laterality: N/A;  . ESOPHAGOGASTRODUODENOSCOPY  2015  . ESOPHAGOGASTRODUODENOSCOPY (EGD) WITH PROPOFOL N/A 11/20/2016   Procedure: ESOPHAGOGASTRODUODENOSCOPY (EGD) WITH PROPOFOL;  Surgeon: Jonathon Bellows, MD;  Location: [facility];  Service: Endoscopy;  Laterality: N/A;  . EXCISION NEUROMA Bilateral 09/24/2014   Procedure: BILATERAL OPEN SAPHENOUS NEURECTOMIES AND OPEN LEFT PERIPATELLA OSETOPHYTECTOMY;  Surgeon: Mauri Pole, MD;  Location: [facility];  Service: Orthopedics;  Laterality: Bilateral;  . EYE SURGERY Left 05/11/2018   cataract extraction  . JOINT REPLACEMENT  2009/2010   BIL TOTAL KNEES  . JOINT REPLACEMENT  2020   left  . THORACOTOMY/LOBECTOMY Right 05/16/2018   Procedure: THORACOTOMY/POSSIBLE LOBECTOMY;  Surgeon: Nestor Lewandowsky, MD;  Location: [facility];  Service: Thoracic;  Laterality: Right;  . TONSILLECTOMY    . TOTAL KNEE REVISION Left 11/29/2018   Procedure: Left TOTAL KNEE REVISION;  Surgeon:  Hessie Knows, MD;  Location: [facility];  Service: Orthopedics;  Laterality: Left;  Marland Kitchen VIDEO BRONCHOSCOPY N/A 05/16/2018   Procedure: PREOP  BRONCHOSCOPY;  Surgeon: Nestor Lewandowsky, MD;  Location: [facility];  Service: Thoracic;  Laterality: N/A;    Family Psychiatric History: Reviewed family psychiatric history from my progress note on 01/14/2018.  Family History:  Family History  Problem Relation Age of Onset  . Asthma Mother   . Diabetes Mother   . Hyperlipidemia Mother   . Hypertension Mother   . Cirrhosis Mother        Non-alcoholic  . Arthritis Father   . Cancer Father        Bone  . Hyperlipidemia Father   . Hypertension Father   . Anxiety disorder Sister   . Depression Sister   . Kidney Stones Son   . Stroke Maternal Grandmother   . Breast cancer Maternal Grandmother 3  . Cancer Maternal Grandfather        lung  . Pneumonia Paternal Grandmother   .  Alzheimer's disease Paternal Grandfather   . Diabetes Sister   . Gout Sister   . Hypertension Sister   . Alcohol abuse Brother   . Heart disease Brother        massive MI    Social History: Reviewed social history from my progress note on 01/14/2018. Social History   Socioeconomic History  . Marital status: Married    Spouse name: Tharon Aquas  . Number of children: 2  . Years of education: 58  . Highest education level: 12th grade  Occupational History  . Occupation: Disabled  Social Needs  . Financial resource strain: Not hard at all  . Food insecurity    Worry: Never true    Inability: Never true  . Transportation needs    Medical: No    Non-medical: No  Tobacco Use  . Smoking status: Former Smoker    Packs/day: 0.50    Types: Cigarettes    Quit date: 04/07/2016    Years since quitting: 2.9  . Smokeless tobacco: Never Used  . Tobacco comment: patient has not smoked x 10 days.06/28/17 chantix  Substance and Sexual Activity  . Alcohol use: No    Alcohol/week: 0.0 standard drinks  . Drug use: No  . Sexual  activity: Yes    Partners: Male    Birth control/protection: None  Lifestyle  . Physical activity    Days per week: 0 days    Minutes per session: 0 min  . Stress: Not at all  Relationships  . Social Herbalist on phone: Never    Gets together: Twice a week    Attends religious service: More than 4 times per year    Active member of club or organization: No    Attends meetings of clubs or organizations: Never    Relationship status: Married  Other Topics Concern  . Not on file  Social History Narrative   Lives at home w/ her husband   Right-handed   Caffeine: occasionally    Allergies:  Allergies  Allergen Reactions  . Morphine And Related Itching    Can tolerate with benadryl  . Cymbalta [Duloxetine Hcl] Other (See Comments)    Pt states it keeps her awake  . Adhesive [Tape] Other (See Comments)    Plastic tape rips skin and bruises her. PLEASE USE PAPER TAPE  . Lexapro [Escitalopram Oxalate] Other (See Comments)    Keeps awake for days.     Metabolic Disorder Labs: No results found for: HGBA1C, MPG No results found for: PROLACTIN Lab Results  Component Value Date   CHOL 169 12/03/2017   TRIG 126 12/03/2017   HDL 45 12/03/2017   LDLCALC 99 12/03/2017   LDLCALC 87 07/10/2016   Lab Results  Component Value Date   TSH 3.676 09/11/2018   TSH 3.16 05/21/2014    Therapeutic Level Labs: No results found for: LITHIUM No results found for: VALPROATE No components found for:  CBMZ  Current Medications: Current Outpatient Medications  Medication Sig Dispense Refill  . albuterol (PROVENTIL HFA;VENTOLIN HFA) 108 (90 Base) MCG/ACT inhaler Inhale 2 puffs into the lungs every 4 (four) hours as needed for wheezing or shortness of breath. 1 Inhaler 2  . albuterol (PROVENTIL) (2.5 MG/3ML) 0.083% nebulizer solution Take 3 mLs (2.5 mg total) by nebulization every 4 (four) hours as needed for wheezing or shortness of breath. 75 mL 12  . aspirin EC 81 MG tablet  Take 81 mg by mouth daily.    Marland Kitchen  aspirin-acetaminophen-caffeine (EXCEDRIN MIGRAINE) 250-250-65 MG tablet Take 1 tablet by mouth daily as needed for headache.    . clonazePAM (KLONOPIN) 0.5 MG tablet Take 0.5-1 tablets (0.25-0.5 mg total) by mouth daily as needed for anxiety. Try to limit use and use only for severe anxiety and sleep problems 25 tablet 1  . estradiol (ESTRACE) 1 MG tablet Take 1 mg by mouth daily.     Marland Kitchen FLUoxetine (PROZAC) 20 MG capsule Take 1 capsule (20 mg total) by mouth daily. To be combined with 40 mg 90 capsule 1  . FLUoxetine (PROZAC) 40 MG capsule Take 1 capsule (40 mg total) by mouth daily. To be combined with 20 mg 90 capsule 1  . guaiFENesin-codeine 100-10 MG/5ML syrup Take 10 mLs by mouth 3 (three) times daily as needed for cough. 120 mL 0  . hydrOXYzine (VISTARIL) 25 MG capsule TAKE 1 CAPSULE(25 MG) BY MOUTH TWICE DAILY AS NEEDED FOR SEVERE ANXIETY OR SYMPTOMS (Patient taking differently: Take 25 mg by mouth 2 (two) times a day. ) 60 capsule 1  . nystatin cream (MYCOSTATIN) Apply 1 application topically 2 (two) times daily. (Patient taking differently: Apply 1 application topically 2 (two) times daily as needed (rash). ) 30 g 0  . omeprazole (PRILOSEC) 40 MG capsule Take 1 capsule (40 mg total) by mouth daily. (Patient taking differently: Take 40 mg by mouth every morning. ) 90 capsule 1  . oxyCODONE-acetaminophen (PERCOCET) 5-325 MG tablet Take 1 tablet by mouth every 4 (four) hours as needed for severe pain. 10 tablet 0  . OXYGEN Inhale 2 L into the lungs at bedtime.    . predniSONE (DELTASONE) 20 MG tablet Take 1 tablet (20 mg total) by mouth daily. (Patient not taking: Reported on 04/03/2019) 5 tablet 0  . pregabalin (LYRICA) 50 MG capsule Take 1 capsule (50 mg total) by mouth 3 (three) times daily. 90 capsule 2  . Suvorexant (BELSOMRA) 20 MG TABS Take 20 mg by mouth at bedtime. 30 tablet 2  . traMADol (ULTRAM) 50 MG tablet Take 1 tablet (50 mg total) by mouth every 6  (six) hours as needed. 90 tablet 0   No current facility-administered medications for this visit.    Facility-Administered Medications Ordered in Other Visits  Medication Dose Route Frequency Provider Last Rate Last Dose  . ipratropium-albuterol (DUONEB) 0.5-2.5 (3) MG/3ML nebulizer solution 3 mL  3 mL Nebulization Q6H Burns, Wandra Feinstein, NP         Musculoskeletal: Strength & Muscle Tone: UTA Gait & Station: Laying in bed Patient leans: N/A  Psychiatric Specialty Exam: Review of Systems  Psychiatric/Behavioral: Positive for depression. The patient has insomnia.   All other systems reviewed and are negative.   There were no vitals taken for this visit.There is no height or weight on file to calculate BMI.  General Appearance: Casual  Eye Contact:  Fair  Speech:  Clear and Coherent  Volume:  Normal  Mood:  Anxious and Depressed  Affect:  Congruent  Thought Process:  Goal Directed and Descriptions of Associations: Intact  Orientation:  Full (Time, Place, and Person)  Thought Content: Logical   Suicidal Thoughts:  No  Homicidal Thoughts:  No  Memory:  Immediate;   Fair Recent;   Fair Remote;   Fair  Judgement:  Fair  Insight:  Fair  Psychomotor Activity:  Normal  Concentration:  Concentration: Fair and Attention Span: Fair  Recall:  [employer] of Knowledge: Fair  Language: Fair  Akathisia:  No  Handed:  Right  AIMS (if indicated): denies tremors , rigidity  Assets:  Communication Skills Desire for Improvement Housing Social Support  ADL's:  Intact  Cognition: WNL  Sleep:  Poor   Screenings: GAD-7     Office Visit from 10/25/2018 in [place] Visit from 09/16/2018 in [place] Visit from 07/13/2018 in [place] Visit from 06/28/2018 in [place] Visit from 04/15/2018 in [place]  Total GAD-7 Score  9  9  19  6  8     PHQ2-9     Procedure visit from 11/15/2018 in  [place] Office Visit from 11/09/2018 in [place] Procedure visit from 10/27/2018 in [place] Office Visit from 10/25/2018 in [place] Procedure visit from 09/29/2018 in [place]  PHQ-2 Total Score  0  2  1  1   0  PHQ-9 Total Score  -  8  -  7  5       Assessment and Plan: Tessia is a 58 year old Caucasian female, with history of MDD, GAD, tremors, migraine headaches, lung cancer, vitamin B12 deficiency, MCI, history of knee replacement was evaluated by telemedicine today.  Patient currently struggles with depression and anxiety as well as sleep problems.  She does have psychosocial stressor of her cancer diagnosis as well as currently being in chemotherapy.  Patient will benefit from medication management.  Plan MDD- some improvement Increase Prozac to 60 mg p.o. daily Patient was referred for CBT however is noncompliant  For GAD- unstable Increase Prozac to 60 mg p.o. daily Add Klonopin 0.25 to 0.5 mg p.o. daily PRN for severe anxiety attacks.  Discussed with patient to limit use.  For insomnia- restless Continue Belsomra 20 mg p.o. nightly Patient reports she was recently given trazodone which she takes during the day, discussed to discontinue it.  Unknown why she was started on it.  She is unable to provide more information. Klonopin 0.25 to 0.5 mg as needed as summarized above, advised to limit use.  For MCI-she will continue to work with neurology.  Follow-up in clinic in 1 month or sooner if needed.  August 11 at 2 PM  I have spent atleast 25 minutes non face to face with patient today. More than 50 % of the time was spent for psychoeducation and supportive psychotherapy and care coordination.  This note was generated in part or whole with voice recognition software. Voice recognition is  usually quite accurate but there are transcription errors that can and very often do occur. I apologize for any typographical errors that were not detected and corrected.         Ursula Alert, MD 04/05/2019, 5:30 PM

## 2019-04-05 NOTE — Interval H&P Note (Signed)
History and Physical Interval Note:  04/05/2019 9:24 AM  Susan Houston  has presented today for surgery, with the diagnosis of R91.8 LUNG NODULE.  The various methods of treatment have been discussed with the patient and family. After consideration of risks, benefits and other options for treatment, the patient has consented to  Procedure(s): INSERTION PORT-A-CATH (N/A) as a surgical intervention.  The patient's history has been reviewed, patient examined, no change in status, stable for surgery.  I have reviewed the patient's chart and labs.  Questions were answered to the patient's satisfaction.     Nestor Lewandowsky

## 2019-04-05 NOTE — Anesthesia Preprocedure Evaluation (Signed)
Anesthesia Evaluation  Patient identified by MRN, date of birth, ID band Patient awake    Reviewed: Allergy & Precautions, NPO status , Patient's Chart, lab work & pertinent test results, reviewed documented beta blocker date and time   Airway Mallampati: II  TM Distance: >3 FB     Dental  (+) Chipped, Lower Dentures, Upper Dentures   Pulmonary shortness of breath, asthma , COPD, former smoker,    Pulmonary exam normal        Cardiovascular Normal cardiovascular exam+ dysrhythmias      Neuro/Psych  Headaches, PSYCHIATRIC DISORDERS Anxiety Depression  Neuromuscular disease    GI/Hepatic Neg liver ROS, GERD  Controlled,  Endo/Other  negative endocrine ROS  Renal/GU negative Renal ROS     Musculoskeletal  (+) Arthritis ,   Abdominal Normal abdominal exam  (+)   Peds  Hematology  (+) anemia ,   Anesthesia Other Findings   Reproductive/Obstetrics                             Anesthesia Physical  Anesthesia Plan  ASA: III  Anesthesia Plan: General   Post-op Pain Management:    Induction: Intravenous  PONV Risk Score and Plan:   Airway Management Planned: LMA and Oral ETT  Additional Equipment:   Intra-op Plan:   Post-operative Plan: Extubation in OR  Informed Consent: I have reviewed the patients History and Physical, chart, labs and discussed the procedure including the risks, benefits and alternatives for the proposed anesthesia with the patient or authorized representative who has indicated his/her understanding and acceptance.       Plan Discussed with: CRNA and Surgeon  Anesthesia Plan Comments:         Anesthesia Quick Evaluation

## 2019-04-05 NOTE — Op Note (Signed)
04/05/2019  11:04 AM  PATIENT:  Susan Houston  58 y.o. female  PRE-OPERATIVE DIAGNOSIS:  R91.8 LUNG NODULE  POST-OPERATIVE DIAGNOSIS:  R91.8 LUNG NODULE  PROCEDURE:  Procedure(s): INSERTION PORT-A-CATH (N/A)  SURGEON:  Surgeon(s) and Role:    * Nestor Lewandowsky, MD - Primary  ASSISTANTS:    ANESTHESIA:     DICTATION:   The patient was brought to the operating suite and placed in the supine position. The patient was then prepped and draped in usual sterile fashion. The left subclavian vein was percutaneously catheterized. A wire was placed into the venous system under fluoroscopic guidance. An appropriate site was selected on the chest wall and a Port-A-Cath pocket was created. The catheter was tunneled from the port site up to the insertion site. The catheter was then inserted through a peel-away sheath and positioned at the appropriate level in the superior vena cava. The catheter was then assembled and aspirated and flushed easily. It was then secured to the anterior chest wall with interrupted Prolene sutures. The catheter was flushed one last time and the wounds were then closed. The subcutaneous tissues were closed with running absorbable sutures and the skin with nylon. Sterile dressings were applied. Patient was then transported to the recovery room in stable condition.   Nestor Lewandowsky, MD

## 2019-04-06 ENCOUNTER — Inpatient Hospital Stay: Payer: Medicare Other

## 2019-04-06 ENCOUNTER — Other Ambulatory Visit: Payer: Self-pay

## 2019-04-06 ENCOUNTER — Inpatient Hospital Stay (HOSPITAL_BASED_OUTPATIENT_CLINIC_OR_DEPARTMENT_OTHER): Payer: Medicare Other | Admitting: Oncology

## 2019-04-06 DIAGNOSIS — Z87891 Personal history of nicotine dependence: Secondary | ICD-10-CM

## 2019-04-06 DIAGNOSIS — J449 Chronic obstructive pulmonary disease, unspecified: Secondary | ICD-10-CM

## 2019-04-06 DIAGNOSIS — M79606 Pain in leg, unspecified: Secondary | ICD-10-CM | POA: Diagnosis not present

## 2019-04-06 DIAGNOSIS — Z8521 Personal history of malignant neoplasm of larynx: Secondary | ICD-10-CM

## 2019-04-06 DIAGNOSIS — J029 Acute pharyngitis, unspecified: Secondary | ICD-10-CM | POA: Diagnosis not present

## 2019-04-06 DIAGNOSIS — C3431 Malignant neoplasm of lower lobe, right bronchus or lung: Secondary | ICD-10-CM

## 2019-04-06 DIAGNOSIS — F419 Anxiety disorder, unspecified: Secondary | ICD-10-CM

## 2019-04-06 DIAGNOSIS — R296 Repeated falls: Secondary | ICD-10-CM

## 2019-04-06 DIAGNOSIS — Z79899 Other long term (current) drug therapy: Secondary | ICD-10-CM | POA: Diagnosis not present

## 2019-04-06 DIAGNOSIS — R251 Tremor, unspecified: Secondary | ICD-10-CM | POA: Diagnosis not present

## 2019-04-06 DIAGNOSIS — Z7982 Long term (current) use of aspirin: Secondary | ICD-10-CM | POA: Diagnosis not present

## 2019-04-06 DIAGNOSIS — K219 Gastro-esophageal reflux disease without esophagitis: Secondary | ICD-10-CM | POA: Diagnosis not present

## 2019-04-06 DIAGNOSIS — Z515 Encounter for palliative care: Secondary | ICD-10-CM | POA: Diagnosis not present

## 2019-04-06 DIAGNOSIS — F329 Major depressive disorder, single episode, unspecified: Secondary | ICD-10-CM

## 2019-04-06 DIAGNOSIS — G3184 Mild cognitive impairment, so stated: Secondary | ICD-10-CM

## 2019-04-06 DIAGNOSIS — G4700 Insomnia, unspecified: Secondary | ICD-10-CM

## 2019-04-06 DIAGNOSIS — M545 Low back pain: Secondary | ICD-10-CM | POA: Diagnosis not present

## 2019-04-06 DIAGNOSIS — R0789 Other chest pain: Secondary | ICD-10-CM | POA: Diagnosis not present

## 2019-04-06 DIAGNOSIS — E785 Hyperlipidemia, unspecified: Secondary | ICD-10-CM | POA: Diagnosis not present

## 2019-04-06 DIAGNOSIS — R0781 Pleurodynia: Secondary | ICD-10-CM | POA: Diagnosis not present

## 2019-04-06 DIAGNOSIS — D649 Anemia, unspecified: Secondary | ICD-10-CM

## 2019-04-06 DIAGNOSIS — Z5111 Encounter for antineoplastic chemotherapy: Secondary | ICD-10-CM | POA: Diagnosis not present

## 2019-04-06 NOTE — Progress Notes (Signed)
[facility]  [phone] [phone]  Patient Care Team: Valerie Roys, DO as PCP - General (Family Medicine) Anabel Bene, MD as Referring Physician (Neurology) Telford Nab, RN as Registered Nurse   Name of the patient: Susan Houston  [id number]  Apr 29, 1961   Date of visit: 04/06/19  Diagnosis-squamous cell carcinoma  Chief complaint/Reason for visit- Initial Meeting for [facility], preparing for starting chemotherapy  Heme/Onc history:  Oncology History Overview Note  # 2011-laryngeal cancer/SCC; Stage II; April-June 2011 s/p  CRT [Dr.Choski/Bennett]  # 2019- likely STAGE I 10 mm RLL nodule [incidental]; s/p wedge resection-July 2019-stage I squamous cell lung cancer. NO  Adjuvant therapy.  # May 28th 2020-stage III lung cancer-recurrent  RIGHT HILAR-- POSITIVE FOR MALIGNANT NEOPLASTIC CELLS. - CONSISTENT WITH METASTATIC SQUAMOUS CELL CARCINOMA.  #June 2020- Carbo-taxol- with RT  # COPD  # post thoracotomy syndrome  DIAGNOSIS: RLL lung ca  STAGE:  III ; GOALS: cure  CURRENT/MOST RECENT THERAPY: Carbo-taxol- RT planned   Laryngeal cancer ([place])  Primary cancer of right lower lobe of lung ([place])  05/27/2018 Initial Diagnosis   Primary cancer of right lower lobe of lung ([place])   04/10/2019 -  Chemotherapy   The patient had palonosetron (ALOXI) injection 0.25 mg, 0.25 mg, Intravenous,  Once, 0 of 8 cycles CARBOplatin (PARAPLATIN) in sodium chloride 0.9 % 100 mL chemo infusion, , Intravenous,  Once, 0 of 8 cycles PACLitaxel (TAXOL) 90 mg in sodium chloride 0.9 % 250 mL chemo infusion (</= 80mg /m2), 45 mg/m2, Intravenous,  Once, 0 of 8 cycles  for chemotherapy treatment.      Interval history-58 year old female who presents to chemo care clinic today for initial meeting in preparation for starting chemotherapy. I introduced the chemo care clinic and we discussed that the role of the clinic  is to assist those who are at an increased risk of emergency room visits and/or complications during the course of chemotherapy treatment. We discussed that the increased risk takes into account factors such as age, performance status, and co-morbidities. We also discussed that for some, this might include barriers to care such as not having a primary care provider, lack of insurance/transportation, or not being able to afford medications. We discussed that the goal of the program is to help prevent unplanned ER visits and help reduce complications during chemotherapy. We do this by discussing specific risk factors to each individual and identifying ways that we can help improve these risk factors and reduce barriers to care.   ECOG FS:1 - Symptomatic but completely ambulatory  Review of systems- Review of Systems  Constitutional: Negative.  Negative for chills, fever, malaise/fatigue and weight loss.  HENT: Negative for congestion, ear pain and tinnitus.   Eyes: Negative.  Negative for blurred vision and double vision.  Respiratory: Positive for shortness of breath. Negative for cough and sputum production.   Cardiovascular: Negative.  Negative for chest pain, palpitations and leg swelling.  Gastrointestinal: Negative.  Negative for abdominal pain, constipation, diarrhea, nausea and vomiting.  Genitourinary: Negative for dysuria, frequency and urgency.  Musculoskeletal: Positive for back pain. Negative for falls.  Skin: Negative.  Negative for rash.  Neurological: Negative.  Negative for weakness and headaches.  Endo/Heme/Allergies: Negative.  Does not bruise/bleed easily.  Psychiatric/Behavioral: Negative.  Negative for depression. The patient is not nervous/anxious and does not have insomnia.      Current treatment- Scheduled to begin concurrent chemoradiation with weekly carbotaxol with  daily radiation Monday through Friday x6 weeks.  Post chemo radiation consolidation immunotherapy with  nivolumab every 2 weeks.  Allergies  Allergen Reactions  . Morphine And Related Itching    Can tolerate with benadryl  . Cymbalta [Duloxetine Hcl] Other (See Comments)    Pt states it keeps her awake  . Adhesive [Tape] Other (See Comments)    Plastic tape rips skin and bruises her. PLEASE USE PAPER TAPE  . Lexapro [Escitalopram Oxalate] Other (See Comments)    Keeps awake for days.      Past Medical History:  Diagnosis Date  . Anemia    vitamin b12 deficiency. no longer taking supplements  . Anxiety   . Arthritis   . Asthma   . Back pain   . Bruises easily   . Chronic leg pain    BILATERAL  . COPD (chronic obstructive pulmonary disease) (H[facility])    no inhalers for over 1 year  . Depression   . Difficulty sleeping   . Diverticulitis   . Dyspnea    due to having part of lung removed in 2019  . Dysrhythmia 04/2018   brady episodes. cleared by dr. Clayborn Bigness  . Facet syndrome, lumbar 03/28/2015  . GERD (gastroesophageal reflux disease)   . Hemorrhoids   . History of kidney stones 04/2018   recently passed stone  . Hyperlipidemia   . Incontinence of urine   . Laryngeal cancer ([place]) 2009   LARYNX CANCER - CHEMO / RADIATION (NO SURGERY)   . Migraine   . Migraine without aura and without status migrainosus, not intractable 09/11/2016  . Mild cognitive impairment 11/23/2017  . MRSA infection greater than 3 months ago    2008 right side of face  . Nerve damage    right leg. thinks this is from her TKR  . Nodule of lower lobe of right lung 04/22/2018  . Oxygen deficiency    2L/HS  . Primary cancer of right lower lobe of lung ([place]) 04/18/2018   Surgical resection of RLL  . Wears dentures    full upper and lower     Past Surgical History:  Procedure Laterality Date  . ABDOMINAL HYSTERECTOMY  1983  . BLADDER SUSPENSION  2014  . CATARACT EXTRACTION W/PHACO Left 05/11/2018   Procedure: CATARACT EXTRACTION PHACO AND INTRAOCULAR LENS PLACEMENT ([place]) LEFT;  Surgeon: Leandrew Koyanagi, MD;  Location: [facility];  Service: Ophthalmology;  Laterality: Left;  PREFERS EARLY  . CATARACT EXTRACTION W/PHACO Right 08/11/2018   Procedure: CATARACT EXTRACTION PHACO AND INTRAOCULAR LENS PLACEMENT ([facility]);  Surgeon: Leandrew Koyanagi, MD;  Location: [facility];  Service: Ophthalmology;  Laterality: Right;  CDE 5:02 Total Time 01:35 Fluid lot # [id number]  . CHOLECYSTECTOMY  1998  . COLONOSCOPY    . ENDOBRONCHIAL ULTRASOUND N/A 03/20/2019   Procedure: ENDOBRONCHIAL ULTRASOUND;  Surgeon: Flora Lipps, MD;  Location: [facility];  Service: Cardiopulmonary;  Laterality: N/A;  . ESOPHAGOGASTRODUODENOSCOPY  2015  . ESOPHAGOGASTRODUODENOSCOPY (EGD) WITH PROPOFOL N/A 11/20/2016   Procedure: ESOPHAGOGASTRODUODENOSCOPY (EGD) WITH PROPOFOL;  Surgeon: Jonathon Bellows, MD;  Location: [facility];  Service: Endoscopy;  Laterality: N/A;  . EXCISION NEUROMA Bilateral 09/24/2014   Procedure: BILATERAL OPEN SAPHENOUS NEURECTOMIES AND OPEN LEFT PERIPATELLA OSETOPHYTECTOMY;  Surgeon: Mauri Pole, MD;  Location: [facility];  Service: Orthopedics;  Laterality: Bilateral;  . EYE SURGERY Left 05/11/2018   cataract extraction  . JOINT REPLACEMENT  2009/2010   BIL TOTAL KNEES  . JOINT REPLACEMENT  2020   left  .  PORTACATH PLACEMENT N/A 04/05/2019   Procedure: INSERTION PORT-A-CATH;  Surgeon: Nestor Lewandowsky, MD;  Location: [facility];  Service: General;  Laterality: N/A;  . THORACOTOMY/LOBECTOMY Right 05/16/2018   Procedure: THORACOTOMY/POSSIBLE LOBECTOMY;  Surgeon: Nestor Lewandowsky, MD;  Location: [facility];  Service: Thoracic;  Laterality: Right;  . TONSILLECTOMY    . TOTAL KNEE REVISION Left 11/29/2018   Procedure: Left TOTAL KNEE REVISION;  Surgeon: Hessie Knows, MD;  Location: [facility];  Service: Orthopedics;  Laterality: Left;  Marland Kitchen VIDEO BRONCHOSCOPY N/A 05/16/2018   Procedure: PREOP  BRONCHOSCOPY;  Surgeon: Nestor Lewandowsky, MD;  Location: [facility];  Service: Thoracic;  Laterality: N/A;    Social History    Socioeconomic History  . Marital status: Married    Spouse name: Tharon Aquas  . Number of children: 2  . Years of education: 68  . Highest education level: 12th grade  Occupational History  . Occupation: Disabled  Social Needs  . Financial resource strain: Not hard at all  . Food insecurity    Worry: Never true    Inability: Never true  . Transportation needs    Medical: No    Non-medical: No  Tobacco Use  . Smoking status: Former Smoker    Packs/day: 0.50    Types: Cigarettes    Quit date: 04/07/2016    Years since quitting: 2.9  . Smokeless tobacco: Never Used  . Tobacco comment: patient has not smoked x 10 days.06/28/17 chantix  Substance and Sexual Activity  . Alcohol use: No    Alcohol/week: 0.0 standard drinks  . Drug use: No  . Sexual activity: Yes    Partners: Male    Birth control/protection: None  Lifestyle  . Physical activity    Days per week: 0 days    Minutes per session: 0 min  . Stress: Not at all  Relationships  . Social Herbalist on phone: Never    Gets together: Twice a week    Attends religious service: More than 4 times per year    Active member of club or organization: No    Attends meetings of clubs or organizations: Never    Relationship status: Married  . Intimate partner violence    Fear of current or ex partner: No    Emotionally abused: No    Physically abused: No    Forced sexual activity: No  Other Topics Concern  . Not on file  Social History Narrative   Lives at home w/ her husband   Right-handed   Caffeine: occasionally    Family History  Problem Relation Age of Onset  . Asthma Mother   . Diabetes Mother   . Hyperlipidemia Mother   . Hypertension Mother   . Cirrhosis Mother        Non-alcoholic  . Arthritis Father   . Cancer Father        Bone  . Hyperlipidemia Father   . Hypertension Father   . Anxiety disorder Sister   . Depression Sister   . Kidney Stones Son   . Stroke Maternal Grandmother   . Breast  cancer Maternal Grandmother 57  . Cancer Maternal Grandfather        lung  . Pneumonia Paternal Grandmother   . Alzheimer's disease Paternal Grandfather   . Diabetes Sister   . Gout Sister   . Hypertension Sister   . Alcohol abuse Brother   . Heart disease Brother        massive MI  Current Outpatient Medications:  .  albuterol (PROVENTIL HFA;VENTOLIN HFA) 108 (90 Base) MCG/ACT inhaler, Inhale 2 puffs into the lungs every 4 (four) hours as needed for wheezing or shortness of breath., Disp: 1 Inhaler, Rfl: 2 .  albuterol (PROVENTIL) (2.5 MG/3ML) 0.083% nebulizer solution, Take 3 mLs (2.5 mg total) by nebulization every 4 (four) hours as needed for wheezing or shortness of breath., Disp: 75 mL, Rfl: 12 .  aspirin EC 81 MG tablet, Take 81 mg by mouth daily., Disp: , Rfl:  .  aspirin-acetaminophen-caffeine (EXCEDRIN MIGRAINE) 250-250-65 MG tablet, Take 1 tablet by mouth daily as needed for headache., Disp: , Rfl:  .  clonazePAM (KLONOPIN) 0.5 MG tablet, Take 0.5-1 tablets (0.25-0.5 mg total) by mouth daily as needed for anxiety. Try to limit use and use only for severe anxiety and sleep problems, Disp: 25 tablet, Rfl: 1 .  estradiol (ESTRACE) 1 MG tablet, Take 1 mg by mouth daily. , Disp: , Rfl:  .  FLUoxetine (PROZAC) 20 MG capsule, Take 1 capsule (20 mg total) by mouth daily. To be combined with 40 mg, Disp: 90 capsule, Rfl: 1 .  FLUoxetine (PROZAC) 40 MG capsule, Take 1 capsule (40 mg total) by mouth daily. To be combined with 20 mg, Disp: 90 capsule, Rfl: 1 .  guaiFENesin-codeine 100-10 MG/5ML syrup, Take 10 mLs by mouth 3 (three) times daily as needed for cough., Disp: 120 mL, Rfl: 0 .  hydrOXYzine (VISTARIL) 25 MG capsule, TAKE 1 CAPSULE(25 MG) BY MOUTH TWICE DAILY AS NEEDED FOR SEVERE ANXIETY OR SYMPTOMS (Patient taking differently: Take 25 mg by mouth 2 (two) times a day. ), Disp: 60 capsule, Rfl: 1 .  nystatin cream (MYCOSTATIN), Apply 1 application topically 2 (two) times daily.  (Patient taking differently: Apply 1 application topically 2 (two) times daily as needed (rash). ), Disp: 30 g, Rfl: 0 .  omeprazole (PRILOSEC) 40 MG capsule, Take 1 capsule (40 mg total) by mouth daily. (Patient taking differently: Take 40 mg by mouth every morning. ), Disp: 90 capsule, Rfl: 1 .  oxyCODONE-acetaminophen (PERCOCET) 5-325 MG tablet, Take 1 tablet by mouth every 4 (four) hours as needed for severe pain., Disp: 10 tablet, Rfl: 0 .  OXYGEN, Inhale 2 L into the lungs at bedtime., Disp: , Rfl:  .  predniSONE (DELTASONE) 20 MG tablet, Take 1 tablet (20 mg total) by mouth daily. (Patient not taking: Reported on 04/03/2019), Disp: 5 tablet, Rfl: 0 .  pregabalin (LYRICA) 50 MG capsule, Take 1 capsule (50 mg total) by mouth 3 (three) times daily., Disp: 90 capsule, Rfl: 2 .  Suvorexant (BELSOMRA) 20 MG TABS, Take 20 mg by mouth at bedtime., Disp: 30 tablet, Rfl: 2 .  traMADol (ULTRAM) 50 MG tablet, Take 1 tablet (50 mg total) by mouth every 6 (six) hours as needed., Disp: 90 tablet, Rfl: 0 No current facility-administered medications for this visit.   Facility-Administered Medications Ordered in Other Visits:  .  ipratropium-albuterol (DUONEB) 0.5-2.5 (3) MG/3ML nebulizer solution 3 mL, 3 mL, Nebulization, Q6H, , Wandra Feinstein, NP  Physical exam: There were no vitals filed for this visit. Physical Exam Constitutional:      Appearance: Normal appearance.  HENT:     Head: Normocephalic and atraumatic.  Eyes:     Pupils: Pupils are equal, round, and reactive to light.  Neck:     Musculoskeletal: Normal range of motion.  Cardiovascular:     Rate and Rhythm: Normal rate and regular rhythm.  Heart sounds: Normal heart sounds. No murmur.  Pulmonary:     Effort: Pulmonary effort is normal.     Breath sounds: Normal breath sounds. No wheezing.  Abdominal:     General: Bowel sounds are normal. There is no distension.     Palpations: Abdomen is soft.     Tenderness: There is no  abdominal tenderness.  Musculoskeletal: Normal range of motion.  Skin:    General: Skin is warm and dry.     Findings: No rash.  Neurological:     Mental Status: She is alert and oriented to person, place, and time.  Psychiatric:        Judgment: Judgment normal.      CMP Latest Ref Rng & Units 03/31/2019  Glucose 70 - 99 mg/dL 92  BUN 6 - 20 mg/dL 8  Creatinine 0.44 - 1.00 mg/dL 0.62  Sodium 135 - 145 mmol/L 141  Potassium 3.5 - 5.1 mmol/L 3.6  Chloride 98 - 111 mmol/L 107  CO2 22 - 32 mmol/L 26  Calcium 8.9 - 10.3 mg/dL 9.0  Total Protein 6.5 - 8.1 g/dL 7.1  Total Bilirubin 0.3 - 1.2 mg/dL 0.6  Alkaline Phos 38 - 126 U/L 66  AST 15 - 41 U/L 16  ALT 0 - 44 U/L 12   CBC Latest Ref Rng & Units 03/31/2019  WBC 4.0 - 10.5 K/uL 7.1  Hemoglobin 12.0 - 15.0 g/dL 11.0(L)  Hematocrit 36.0 - 46.0 % 35.0(L)  Platelets 150 - 400 K/uL 236    No images are attached to the encounter.  Dg Chest Port 1 View  Result Date: 04/05/2019 CLINICAL DATA:  Postoperative study. EXAM: PORTABLE CHEST 1 VIEW COMPARISON:  November 12, 2018 FINDINGS: The left Port-A-Cath terminates in the central SVC. Postoperative changes in the right base. No pneumothorax. The cardiomediastinal silhouette is normal. IMPRESSION: The left Port-A-Cath terminates in the central SVC. No pneumothorax. No other acute abnormalities. Electronically Signed   By: Dorise Bullion III M.D   On: 04/05/2019 11:44   Dg C-arm 1-60 Min-no Report  Result Date: 04/05/2019 Fluoroscopy was utilized by the requesting physician.  No radiographic interpretation.     Assessment and plan- Patient is a 58 y.o. female who presents to [facility] for initial meeting in preparation for starting concurrent chemoradiation for treatment of recurrent squamous cell carcinoma    1. Cancer-recurrent squamous cell carcinoma  2. Chemo Care Clinic/High Risk for ER/Hospitalization during chemotherapy- We discussed the role of the chemo care clinic  and identified patient specific risk factors. I discussed that patient was identified as high risk primarily based on: Medication complexity and 10 or more ED admissions within the past year.  Current PCP-[facility] Admissions-10 ED Visits-8 Medicaid: No Medicare: No  in relationship: Married and lives with husband Has anemia: Yes Has asthma: No Has atrial fibrillation no Has cvd: No Has ckd: No  has copd: Yes Has chf: No Has connective tissue disorder: No Has depression: Yes Has diabetes: No Has liver disease: No Has PVD: No  3. Social Determinants of Health Performance Status/Activity: Gets around on her own; ambulates without assitance Alcohol Use-rarely Depression - see above Financial Needs-would appreciate social work input/consult.  States rent is over 700 /month and her husband is the only one in employed at this time. They are limited on funds and barely can afford bills. She was also asking for a gas card. Food Insecurity-no Housing-rent lives with husband Interpersonal Violence-none Social Connections-has 3 adult  sons; lives with her husband Stress-yes Tobacco Use-not currently Transportation-drives herself  4. Co-morbidities Complicating Care: Chronic anxiety, mild cognitive impairment and lung cancer.   She is managed by Dr. [facility] clinic for memory loss, tremors with frequent falls.  Last seen on 12/15/2017 for worsening memory loss.  MMSE 26 out of 30.  Stable on Topamax.  May need Lamictal in the future for mood stabilization.  Her tremors are managed with Sinemet 3 times daily likely parkinsonism due to long-term use of antipsychotics.  Referred to physical therapy for frequent falls.   We also discussed the role of the Symptom Management Clinic at [facility] and methods of contacting clinic/provider. She denies needing specific assistance at this time and She will be followed by Telford Nab, RN (Nurse Navigator).   Visit Diagnosis 1. Primary  cancer of right lower lobe of lung [facility])    Patient expressed understanding and was in agreement with this plan. She also understands that She can call clinic at any time with any questions, concerns, or complaints.   A total of (25) minutes of face-to-face time was spent with this patient with greater than 50% of that time in counseling and care-coordination.  Faythe Casa, AGNP-C [facility] [facility] [phone] (work cell) [phone] (office) 04/06/19 1:18 PM  CC: Elease Etienne CC: Dr. Wynetta Emery

## 2019-04-10 ENCOUNTER — Other Ambulatory Visit: Payer: Self-pay

## 2019-04-10 ENCOUNTER — Ambulatory Visit
Admission: RE | Admit: 2019-04-10 | Discharge: 2019-04-10 | Disposition: A | Discharge status: Home / Self Care | Payer: Medicare Other | Source: Ambulatory Visit | Attending: Radiation Oncology | Admitting: Radiation Oncology

## 2019-04-10 ENCOUNTER — Other Ambulatory Visit: Payer: Self-pay | Admitting: *Deleted

## 2019-04-10 ENCOUNTER — Telehealth: Payer: Self-pay | Admitting: Cardiothoracic Surgery

## 2019-04-10 ENCOUNTER — Other Ambulatory Visit: Payer: Self-pay | Admitting: Psychiatry

## 2019-04-10 DIAGNOSIS — F330 Major depressive disorder, recurrent, mild: Secondary | ICD-10-CM

## 2019-04-10 DIAGNOSIS — C3431 Malignant neoplasm of lower lobe, right bronchus or lung: Secondary | ICD-10-CM | POA: Diagnosis not present

## 2019-04-10 DIAGNOSIS — Z510 Encounter for antineoplastic radiation therapy: Secondary | ICD-10-CM | POA: Diagnosis not present

## 2019-04-10 DIAGNOSIS — C771 Secondary and unspecified malignant neoplasm of intrathoracic lymph nodes: Secondary | ICD-10-CM | POA: Diagnosis not present

## 2019-04-10 DIAGNOSIS — Z87891 Personal history of nicotine dependence: Secondary | ICD-10-CM | POA: Diagnosis not present

## 2019-04-10 MED ORDER — PROCHLORPERAZINE MALEATE 10 MG PO TABS: 10.0000 mg | ORAL_TABLET | Freq: Four times a day (QID) | ORAL | 1 refills | Status: AC | PRN

## 2019-04-10 MED ORDER — LIDOCAINE-PRILOCAINE 2.5-2.5 % EX CREA: TOPICAL_CREAM | CUTANEOUS | 3 refills | Status: AC

## 2019-04-10 MED ORDER — ONDANSETRON HCL 8 MG PO TABS: 8.0000 mg | ORAL_TABLET | Freq: Two times a day (BID) | ORAL | 1 refills | Status: AC | PRN

## 2019-04-10 NOTE — Telephone Encounter (Signed)
Patient has some questions and is asking for one of the nurses to give her a call back. Please call patient and advise.

## 2019-04-10 NOTE — Telephone Encounter (Signed)
Left message for patient to call us back.  

## 2019-04-10 NOTE — Telephone Encounter (Signed)
Left message for patient to return call.

## 2019-04-11 ENCOUNTER — Ambulatory Visit
Admission: RE | Admit: 2019-04-11 | Discharge: 2019-04-11 | Disposition: A | Discharge status: Home / Self Care | Payer: Medicare Other | Source: Ambulatory Visit | Attending: Radiation Oncology | Admitting: Radiation Oncology

## 2019-04-11 ENCOUNTER — Other Ambulatory Visit: Payer: Self-pay

## 2019-04-11 DIAGNOSIS — C771 Secondary and unspecified malignant neoplasm of intrathoracic lymph nodes: Secondary | ICD-10-CM | POA: Diagnosis not present

## 2019-04-11 DIAGNOSIS — Z510 Encounter for antineoplastic radiation therapy: Secondary | ICD-10-CM | POA: Diagnosis not present

## 2019-04-11 DIAGNOSIS — Z87891 Personal history of nicotine dependence: Secondary | ICD-10-CM | POA: Diagnosis not present

## 2019-04-11 DIAGNOSIS — C3431 Malignant neoplasm of lower lobe, right bronchus or lung: Secondary | ICD-10-CM | POA: Diagnosis not present

## 2019-04-11 NOTE — Telephone Encounter (Signed)
Left message for patient to return call.

## 2019-04-11 NOTE — Telephone Encounter (Signed)
Tried reaching patient via phone however goes straight to vm, left message for patient to return call to office x 3. Patient has scheduled Nurse visit 04/14/2019 will answer any questions at the visit.

## 2019-04-12 ENCOUNTER — Other Ambulatory Visit: Payer: Self-pay

## 2019-04-12 ENCOUNTER — Ambulatory Visit
Admission: RE | Admit: 2019-04-12 | Discharge: 2019-04-12 | Disposition: A | Discharge status: Home / Self Care | Payer: Medicare Other | Source: Ambulatory Visit | Attending: Radiation Oncology | Admitting: Radiation Oncology

## 2019-04-12 DIAGNOSIS — Z87891 Personal history of nicotine dependence: Secondary | ICD-10-CM | POA: Diagnosis not present

## 2019-04-12 DIAGNOSIS — C3431 Malignant neoplasm of lower lobe, right bronchus or lung: Secondary | ICD-10-CM | POA: Diagnosis not present

## 2019-04-12 DIAGNOSIS — Z510 Encounter for antineoplastic radiation therapy: Secondary | ICD-10-CM | POA: Diagnosis not present

## 2019-04-12 DIAGNOSIS — C771 Secondary and unspecified malignant neoplasm of intrathoracic lymph nodes: Secondary | ICD-10-CM | POA: Diagnosis not present

## 2019-04-13 ENCOUNTER — Other Ambulatory Visit: Payer: Self-pay

## 2019-04-13 ENCOUNTER — Ambulatory Visit
Admission: RE | Admit: 2019-04-13 | Discharge: 2019-04-13 | Disposition: A | Discharge status: Home / Self Care | Payer: Medicare Other | Source: Ambulatory Visit | Attending: Radiation Oncology | Admitting: Radiation Oncology

## 2019-04-13 DIAGNOSIS — C771 Secondary and unspecified malignant neoplasm of intrathoracic lymph nodes: Secondary | ICD-10-CM | POA: Diagnosis not present

## 2019-04-13 DIAGNOSIS — Z510 Encounter for antineoplastic radiation therapy: Secondary | ICD-10-CM | POA: Diagnosis not present

## 2019-04-13 DIAGNOSIS — C3431 Malignant neoplasm of lower lobe, right bronchus or lung: Secondary | ICD-10-CM | POA: Diagnosis not present

## 2019-04-13 DIAGNOSIS — Z87891 Personal history of nicotine dependence: Secondary | ICD-10-CM | POA: Diagnosis not present

## 2019-04-14 ENCOUNTER — Ambulatory Visit (INDEPENDENT_AMBULATORY_CARE_PROVIDER_SITE_OTHER): Payer: Medicare Other

## 2019-04-14 ENCOUNTER — Other Ambulatory Visit: Payer: Self-pay

## 2019-04-14 ENCOUNTER — Ambulatory Visit
Admission: RE | Admit: 2019-04-14 | Discharge: 2019-04-14 | Disposition: A | Discharge status: Home / Self Care | Payer: Medicare Other | Source: Ambulatory Visit | Attending: Radiation Oncology | Admitting: Radiation Oncology

## 2019-04-14 DIAGNOSIS — R918 Other nonspecific abnormal finding of lung field: Secondary | ICD-10-CM

## 2019-04-14 DIAGNOSIS — C771 Secondary and unspecified malignant neoplasm of intrathoracic lymph nodes: Secondary | ICD-10-CM | POA: Diagnosis not present

## 2019-04-14 DIAGNOSIS — Z87891 Personal history of nicotine dependence: Secondary | ICD-10-CM | POA: Diagnosis not present

## 2019-04-14 DIAGNOSIS — Z510 Encounter for antineoplastic radiation therapy: Secondary | ICD-10-CM | POA: Diagnosis not present

## 2019-04-14 DIAGNOSIS — C3431 Malignant neoplasm of lower lobe, right bronchus or lung: Secondary | ICD-10-CM | POA: Diagnosis not present

## 2019-04-14 NOTE — Progress Notes (Signed)
Patient ID: Susan Houston, female   DOB: Nov 13, 1960, 58 y.o.   MRN: [id number]  Patient came in today for a wound check for port placement.  The wound is clean, with no signs of infection noted. Sutures removed. Follow up as needed.

## 2019-04-17 ENCOUNTER — Inpatient Hospital Stay: Payer: Medicare Other

## 2019-04-17 ENCOUNTER — Inpatient Hospital Stay (HOSPITAL_BASED_OUTPATIENT_CLINIC_OR_DEPARTMENT_OTHER): Payer: Medicare Other | Admitting: Hospice and Palliative Medicine

## 2019-04-17 ENCOUNTER — Encounter: Payer: Self-pay | Admitting: *Deleted

## 2019-04-17 ENCOUNTER — Other Ambulatory Visit: Payer: Self-pay

## 2019-04-17 ENCOUNTER — Ambulatory Visit
Admission: RE | Admit: 2019-04-17 | Discharge: 2019-04-17 | Disposition: A | Discharge status: Home / Self Care | Payer: Medicare Other | Source: Ambulatory Visit | Attending: Radiation Oncology | Admitting: Radiation Oncology

## 2019-04-17 ENCOUNTER — Inpatient Hospital Stay (HOSPITAL_BASED_OUTPATIENT_CLINIC_OR_DEPARTMENT_OTHER): Payer: Medicare Other | Admitting: Internal Medicine

## 2019-04-17 VITALS — BP 150/80 | HR 61 | Temp 97.2°F | Resp 18

## 2019-04-17 DIAGNOSIS — R251 Tremor, unspecified: Secondary | ICD-10-CM

## 2019-04-17 DIAGNOSIS — D649 Anemia, unspecified: Secondary | ICD-10-CM

## 2019-04-17 DIAGNOSIS — J029 Acute pharyngitis, unspecified: Secondary | ICD-10-CM

## 2019-04-17 DIAGNOSIS — J449 Chronic obstructive pulmonary disease, unspecified: Secondary | ICD-10-CM | POA: Diagnosis not present

## 2019-04-17 DIAGNOSIS — Z87891 Personal history of nicotine dependence: Secondary | ICD-10-CM | POA: Diagnosis not present

## 2019-04-17 DIAGNOSIS — K219 Gastro-esophageal reflux disease without esophagitis: Secondary | ICD-10-CM

## 2019-04-17 DIAGNOSIS — Z515 Encounter for palliative care: Secondary | ICD-10-CM | POA: Diagnosis not present

## 2019-04-17 DIAGNOSIS — G4700 Insomnia, unspecified: Secondary | ICD-10-CM | POA: Diagnosis not present

## 2019-04-17 DIAGNOSIS — M545 Low back pain: Secondary | ICD-10-CM

## 2019-04-17 DIAGNOSIS — F419 Anxiety disorder, unspecified: Secondary | ICD-10-CM

## 2019-04-17 DIAGNOSIS — G3184 Mild cognitive impairment, so stated: Secondary | ICD-10-CM

## 2019-04-17 DIAGNOSIS — C3431 Malignant neoplasm of lower lobe, right bronchus or lung: Secondary | ICD-10-CM

## 2019-04-17 DIAGNOSIS — R296 Repeated falls: Secondary | ICD-10-CM

## 2019-04-17 DIAGNOSIS — Z510 Encounter for antineoplastic radiation therapy: Secondary | ICD-10-CM | POA: Diagnosis not present

## 2019-04-17 DIAGNOSIS — R0789 Other chest pain: Secondary | ICD-10-CM

## 2019-04-17 DIAGNOSIS — F329 Major depressive disorder, single episode, unspecified: Secondary | ICD-10-CM

## 2019-04-17 DIAGNOSIS — M79606 Pain in leg, unspecified: Secondary | ICD-10-CM

## 2019-04-17 DIAGNOSIS — Z79899 Other long term (current) drug therapy: Secondary | ICD-10-CM

## 2019-04-17 DIAGNOSIS — C771 Secondary and unspecified malignant neoplasm of intrathoracic lymph nodes: Secondary | ICD-10-CM | POA: Diagnosis not present

## 2019-04-17 DIAGNOSIS — Z8521 Personal history of malignant neoplasm of larynx: Secondary | ICD-10-CM

## 2019-04-17 DIAGNOSIS — R0781 Pleurodynia: Secondary | ICD-10-CM

## 2019-04-17 DIAGNOSIS — Z5111 Encounter for antineoplastic chemotherapy: Secondary | ICD-10-CM | POA: Diagnosis not present

## 2019-04-17 DIAGNOSIS — E785 Hyperlipidemia, unspecified: Secondary | ICD-10-CM | POA: Diagnosis not present

## 2019-04-17 DIAGNOSIS — Z7982 Long term (current) use of aspirin: Secondary | ICD-10-CM | POA: Diagnosis not present

## 2019-04-17 DIAGNOSIS — C3401 Malignant neoplasm of right main bronchus: Secondary | ICD-10-CM

## 2019-04-17 LAB — CBC WITH DIFFERENTIAL/PLATELET
Abs Immature Granulocytes: 0.02 10*3/uL (ref 0.00–0.07)
Basophils Absolute: 0 10*3/uL (ref 0.0–0.1)
Basophils Relative: 1 %
Eosinophils Absolute: 0.2 10*3/uL (ref 0.0–0.5)
Eosinophils Relative: 3 %
HCT: 34.7 % — ABNORMAL LOW (ref 36.0–46.0)
Hemoglobin: 10.8 g/dL — ABNORMAL LOW (ref 12.0–15.0)
Immature Granulocytes: 0 %
Lymphocytes Relative: 19 %
Lymphs Abs: 1.1 10*3/uL (ref 0.7–4.0)
MCH: 25.5 pg — ABNORMAL LOW (ref 26.0–34.0)
MCHC: 31.1 g/dL (ref 30.0–36.0)
MCV: 82 fL (ref 80.0–100.0)
Monocytes Absolute: 0.5 10*3/uL (ref 0.1–1.0)
Monocytes Relative: 9 %
Neutro Abs: 4 10*3/uL (ref 1.7–7.7)
Neutrophils Relative %: 68 %
Platelets: 214 10*3/uL (ref 150–400)
RBC: 4.23 MIL/uL (ref 3.87–5.11)
RDW: 14.6 % (ref 11.5–15.5)
WBC: 5.9 10*3/uL (ref 4.0–10.5)
nRBC: 0 % (ref 0.0–0.2)

## 2019-04-17 LAB — COMPREHENSIVE METABOLIC PANEL
ALT: 11 U/L (ref 0–44)
AST: 16 U/L (ref 15–41)
Albumin: 3.5 g/dL (ref 3.5–5.0)
Alkaline Phosphatase: 90 U/L (ref 38–126)
Anion gap: 8 (ref 5–15)
BUN: 12 mg/dL (ref 6–20)
CO2: 26 mmol/L (ref 22–32)
Calcium: 8.7 mg/dL — ABNORMAL LOW (ref 8.9–10.3)
Chloride: 105 mmol/L (ref 98–111)
Creatinine, Ser: 0.63 mg/dL (ref 0.44–1.00)
GFR calc Af Amer: >60 mL/min (ref >60)
GFR calc non Af Amer: >60 mL/min (ref >60)
Glucose, Bld: 130 mg/dL — ABNORMAL HIGH (ref 70–99)
Potassium: 3.8 mmol/L (ref 3.5–5.1)
Sodium: 139 mmol/L (ref 135–145)
Total Bilirubin: 0.4 mg/dL (ref 0.3–1.2)
Total Protein: 7.1 g/dL (ref 6.5–8.1)

## 2019-04-17 MED ORDER — SODIUM CHLORIDE 0.9 % IV SOLN
20.0000 mg | Freq: Once | INTRAVENOUS | Status: AC
  Administered 2019-04-17: 20 mg via INTRAVENOUS
  Filled 2019-04-17: qty 2

## 2019-04-17 MED ORDER — FAMOTIDINE IN NACL 20-0.9 MG/50ML-% IV SOLN
20.0000 mg | Freq: Once | INTRAVENOUS | Status: AC
  Administered 2019-04-17: 20 mg via INTRAVENOUS
  Filled 2019-04-17: qty 50

## 2019-04-17 MED ORDER — SODIUM CHLORIDE 0.9 % IV SOLN
265.8000 mg | Freq: Once | INTRAVENOUS | Status: AC
  Administered 2019-04-17: 270 mg via INTRAVENOUS
  Filled 2019-04-17: qty 27

## 2019-04-17 MED ORDER — SODIUM CHLORIDE 0.9 % IV SOLN
Freq: Once | INTRAVENOUS | Status: AC
  Administered 2019-04-17: 10:00:00 via INTRAVENOUS
  Filled 2019-04-17: qty 250

## 2019-04-17 MED ORDER — HEPARIN SOD (PORK) LOCK FLUSH 100 UNIT/ML IV SOLN
500.0000 [IU] | Freq: Once | INTRAVENOUS | Status: AC
  Administered 2019-04-17: 500 [IU] via INTRAVENOUS

## 2019-04-17 MED ORDER — DIPHENHYDRAMINE HCL 50 MG/ML IJ SOLN
50.0000 mg | Freq: Once | INTRAMUSCULAR | Status: AC
  Administered 2019-04-17: 50 mg via INTRAVENOUS
  Filled 2019-04-17: qty 1

## 2019-04-17 MED ORDER — PALONOSETRON HCL INJECTION 0.25 MG/5ML
0.2500 mg | Freq: Once | INTRAVENOUS | Status: AC
  Administered 2019-04-17: 0.25 mg via INTRAVENOUS
  Filled 2019-04-17: qty 5

## 2019-04-17 MED ORDER — OXYCODONE-ACETAMINOPHEN 5-325 MG PO TABS: 1.0000 | ORAL_TABLET | Freq: Three times a day (TID) | ORAL | 0 refills | Status: DC | PRN

## 2019-04-17 MED ORDER — MAGIC MOUTHWASH: 5.0000 mL | Freq: Three times a day (TID) | ORAL | 0 refills | Status: DC | PRN

## 2019-04-17 MED ORDER — SODIUM CHLORIDE 0.9 % IV SOLN
45.0000 mg/m2 | Freq: Once | INTRAVENOUS | Status: AC
  Administered 2019-04-17: 90 mg via INTRAVENOUS
  Filled 2019-04-17: qty 15

## 2019-04-17 NOTE — Assessment & Plan Note (Addendum)
#  Recurrent squamous cell carcinoma status post biopsy of right hilar lymph node-stage III.   #Proceed with carbotaxol cycle #1 weekly; along with radiation.  Again reviewed the potential side effects of chemotherapy including but not limited to nausea vomiting diarrhea fatigue risk of infections neuropathy.  # right chest wall pain- pleuritic; likely musculoskeletal unlikely PE.  Give a trial of Percocet every 8 hours.  # COPD-stable.  Continue inhalers at home.  # insominia/anxiety- on trazadone/ seroqul; klonipin--stable  # DISPOSITION:  # Treatment today # 1 week- carbo-Taxol/cbc/bmp # 2 weeks- MD- cbc/cmp;carbo-taxol- Dr.B

## 2019-04-17 NOTE — Progress Notes (Signed)
+[facility] [place] [facility] OFFICE PROGRESS NOTE  Patient Care Team: Valerie Roys, DO as PCP - General (Family Medicine) Anabel Bene, MD as Referring Physician (Neurology) Telford Nab, RN as Registered Nurse  Cancer Staging No matching staging information was found for the patient.   Oncology History Overview Note  # 2011-laryngeal cancer/SCC; Stage II; April-June 2011 s/p  CRT [Dr.Choski/Bennett]  # 2019- likely STAGE I 10 mm RLL nodule [incidental]; s/p wedge resection-July 2019-stage I squamous cell lung cancer. NO  Adjuvant therapy.  # May 28th 2020-stage III lung cancer-recurrent  RIGHT HILAR-- POSITIVE FOR MALIGNANT NEOPLASTIC CELLS. - CONSISTENT WITH METASTATIC SQUAMOUS CELL CARCINOMA.  #June 2020- Carbo-taxol- with RT  # COPD  # post thoracotomy syndrome  DIAGNOSIS: RLL lung ca  STAGE:  III ; GOALS: cure  CURRENT/MOST RECENT THERAPY: Carbo-taxol- RT planned   Laryngeal cancer ([place])  Primary cancer of right lower lobe of lung ([place])  05/27/2018 Initial Diagnosis   Primary cancer of right lower lobe of lung ([place])   04/17/2019 -  Chemotherapy   The patient had palonosetron (ALOXI) injection 0.25 mg, 0.25 mg, Intravenous,  Once, 1 of 8 cycles CARBOplatin (PARAPLATIN) 270 mg in sodium chloride 0.9 % 100 mL chemo infusion, 270 mg (100 % of original dose 265.8 mg), Intravenous,  Once, 1 of 8 cycles Dose modification:   (original dose 265.8 mg, Cycle 1) PACLitaxel (TAXOL) 90 mg in sodium chloride 0.9 % 250 mL chemo infusion (</= 80mg /m2), 45 mg/m2 = 90 mg, Intravenous,  Once, 1 of 8 cycles  for chemotherapy treatment.      INTERVAL HISTORY:  Susan Houston 58 y.o.  female recurrent stage III squamous lung cancer is here to proceed with carbotaxol weekly; patient started radiation last week.  Patient complains of right chest wall pain pleuritic 7 on a scale of 10.  No fever chills.  Mildly worse cough.  No significant difficulty breathing.  No swelling in the legs.   Her back pain resolved.  Review of Systems  Constitutional: Positive for malaise/fatigue. Negative for chills, diaphoresis, fever and weight loss.  HENT: Negative for nosebleeds and sore throat.   Eyes: Negative for double vision.  Respiratory: Positive for cough and shortness of breath. Negative for hemoptysis, sputum production and wheezing.   Cardiovascular: Positive for chest pain. Negative for palpitations, orthopnea and leg swelling.  Gastrointestinal: Positive for constipation and diarrhea. Negative for abdominal pain, blood in stool, heartburn, melena, nausea and vomiting.  Genitourinary: Negative for dysuria, frequency and urgency.  Musculoskeletal: Negative for back pain and joint pain.  Skin: Negative.  Negative for itching and rash.  Neurological: Negative for dizziness, tingling, focal weakness, weakness and headaches.  Endo/Heme/Allergies: Does not bruise/bleed easily.  Psychiatric/Behavioral: Negative for depression. The patient is nervous/anxious and has insomnia.       PAST MEDICAL HISTORY :  Past Medical History:  Diagnosis Date  . Anemia    vitamin b12 deficiency. no longer taking supplements  . Anxiety   . Arthritis   . Asthma   . Back pain   . Bruises easily   . Chronic leg pain    BILATERAL  . COPD (chronic obstructive pulmonary disease) (HCC)    no inhalers for over 1 year  . Depression   . Difficulty sleeping   . Diverticulitis   . Dyspnea    due to having part of lung removed in 2019  . Dysrhythmia 04/2018   brady episodes. cleared by dr. Clayborn Bigness  . Facet  syndrome, lumbar 03/28/2015  . GERD (gastroesophageal reflux disease)   . Hemorrhoids   . History of kidney stones 04/2018   recently passed stone  . Hyperlipidemia   . Incontinence of urine   . Laryngeal cancer ([place]) 2009   [facility] CANCER - CHEMO / RADIATION (NO SURGERY)   . Migraine   . Migraine without aura and without status migrainosus, not intractable 09/11/2016  . Mild cognitive  impairment 11/23/2017  . MRSA infection greater than 3 months ago    2008 right side of face  . Nerve damage    right leg. thinks this is from her TKR  . Nodule of lower lobe of right lung 04/22/2018  . Oxygen deficiency    2L/HS  . Primary cancer of right lower lobe of lung ([employer]) 04/18/2018   Surgical resection of RLL  . Wears dentures    full upper and lower    PAST SURGICAL HISTORY :   Past Surgical History:  Procedure Laterality Date  . ABDOMINAL HYSTERECTOMY  1983  . BLADDER SUSPENSION  2014  . CATARACT EXTRACTION W/PHACO Left 05/11/2018   Procedure: CATARACT EXTRACTION PHACO AND INTRAOCULAR LENS PLACEMENT ([place]) LEFT;  Surgeon: Leandrew Koyanagi, MD;  Location: [facility];  Service: Ophthalmology;  Laterality: Left;  PREFERS EARLY  . CATARACT EXTRACTION W/PHACO Right 08/11/2018   Procedure: CATARACT EXTRACTION PHACO AND INTRAOCULAR LENS PLACEMENT ([facility]);  Surgeon: Leandrew Koyanagi, MD;  Location: [facility];  Service: Ophthalmology;  Laterality: Right;  CDE 5:02 Total Time 01:35 Fluid lot # [id number]  . CHOLECYSTECTOMY  1998  . COLONOSCOPY    . ENDOBRONCHIAL ULTRASOUND N/A 03/20/2019   Procedure: ENDOBRONCHIAL ULTRASOUND;  Surgeon: Flora Lipps, MD;  Location: [facility];  Service: Cardiopulmonary;  Laterality: N/A;  . ESOPHAGOGASTRODUODENOSCOPY  2015  . ESOPHAGOGASTRODUODENOSCOPY (EGD) WITH PROPOFOL N/A 11/20/2016   Procedure: ESOPHAGOGASTRODUODENOSCOPY (EGD) WITH PROPOFOL;  Surgeon: Jonathon Bellows, MD;  Location: [facility];  Service: Endoscopy;  Laterality: N/A;  . EXCISION NEUROMA Bilateral 09/24/2014   Procedure: BILATERAL OPEN SAPHENOUS NEURECTOMIES AND OPEN LEFT PERIPATELLA OSETOPHYTECTOMY;  Surgeon: Mauri Pole, MD;  Location: [facility];  Service: Orthopedics;  Laterality: Bilateral;  . EYE SURGERY Left 05/11/2018   cataract extraction  . JOINT REPLACEMENT  2009/2010   BIL TOTAL KNEES  . JOINT REPLACEMENT  2020   left  . PORTACATH PLACEMENT N/A 04/05/2019    Procedure: INSERTION PORT-A-CATH;  Surgeon: Nestor Lewandowsky, MD;  Location: [facility];  Service: General;  Laterality: N/A;  . THORACOTOMY/LOBECTOMY Right 05/16/2018   Procedure: THORACOTOMY/POSSIBLE LOBECTOMY;  Surgeon: Nestor Lewandowsky, MD;  Location: [facility];  Service: Thoracic;  Laterality: Right;  . TONSILLECTOMY    . TOTAL KNEE REVISION Left 11/29/2018   Procedure: Left TOTAL KNEE REVISION;  Surgeon: Hessie Knows, MD;  Location: [facility];  Service: Orthopedics;  Laterality: Left;  Marland Kitchen VIDEO BRONCHOSCOPY N/A 05/16/2018   Procedure: PREOP  BRONCHOSCOPY;  Surgeon: Nestor Lewandowsky, MD;  Location: [facility];  Service: Thoracic;  Laterality: N/A;    FAMILY HISTORY :   Family History  Problem Relation Age of Onset  . Asthma Mother   . Diabetes Mother   . Hyperlipidemia Mother   . Hypertension Mother   . Cirrhosis Mother        Non-alcoholic  . Arthritis Father   . Cancer Father        Bone  . Hyperlipidemia Father   . Hypertension Father   . Anxiety disorder Sister   . Depression Sister   .  Kidney Stones Son   . Stroke Maternal Grandmother   . Breast cancer Maternal Grandmother 47  . Cancer Maternal Grandfather        lung  . Pneumonia Paternal Grandmother   . Alzheimer's disease Paternal Grandfather   . Diabetes Sister   . Gout Sister   . Hypertension Sister   . Alcohol abuse Brother   . Heart disease Brother        massive MI    SOCIAL HISTORY:   Social History   Tobacco Use  . Smoking status: Former Smoker    Packs/day: 0.50    Types: Cigarettes    Quit date: 04/07/2016    Years since quitting: 3.0  . Smokeless tobacco: Never Used  . Tobacco comment: patient has not smoked x 10 days.06/28/17 chantix  Substance Use Topics  . Alcohol use: No    Alcohol/week: 0.0 standard drinks  . Drug use: No    ALLERGIES:  is allergic to morphine and related; cymbalta [duloxetine hcl]; adhesive [tape]; and lexapro [escitalopram oxalate].  MEDICATIONS:  Current Outpatient Medications   Medication Sig Dispense Refill  . albuterol (PROVENTIL HFA;VENTOLIN HFA) 108 (90 Base) MCG/ACT inhaler Inhale 2 puffs into the lungs every 4 (four) hours as needed for wheezing or shortness of breath. 1 Inhaler 2  . albuterol (PROVENTIL) (2.5 MG/3ML) 0.083% nebulizer solution Take 3 mLs (2.5 mg total) by nebulization every 4 (four) hours as needed for wheezing or shortness of breath. 75 mL 12  . aspirin EC 81 MG tablet Take 81 mg by mouth daily.    Marland Kitchen aspirin-acetaminophen-caffeine (EXCEDRIN MIGRAINE) 250-250-65 MG tablet Take 1 tablet by mouth daily as needed for headache.    . clonazePAM (KLONOPIN) 0.5 MG tablet Take 0.5-1 tablets (0.25-0.5 mg total) by mouth daily as needed for anxiety. Try to limit use and use only for severe anxiety and sleep problems 25 tablet 1  . estradiol (ESTRACE) 1 MG tablet Take 1 mg by mouth daily.     Marland Kitchen FLUoxetine (PROZAC) 20 MG capsule Take 1 capsule (20 mg total) by mouth daily. To be combined with 40 mg 90 capsule 1  . FLUoxetine (PROZAC) 40 MG capsule Take 1 capsule (40 mg total) by mouth daily. To be combined with 20 mg 90 capsule 1  . guaiFENesin-codeine 100-10 MG/5ML syrup Take 10 mLs by mouth 3 (three) times daily as needed for cough. 120 mL 0  . hydrOXYzine (VISTARIL) 25 MG capsule TAKE 1 CAPSULE(25 MG) BY MOUTH TWICE DAILY AS NEEDED FOR SEVERE ANXIETY OR SYMPTOMS 60 capsule 1  . lidocaine-prilocaine (EMLA) cream Apply to affected area once 30 g 3  . nystatin cream (MYCOSTATIN) Apply 1 application topically 2 (two) times daily. (Patient taking differently: Apply 1 application topically 2 (two) times daily as needed (rash). ) 30 g 0  . omeprazole (PRILOSEC) 40 MG capsule Take 1 capsule (40 mg total) by mouth daily. (Patient taking differently: Take 40 mg by mouth every morning. ) 90 capsule 1  . ondansetron (ZOFRAN) 8 MG tablet Take 1 tablet (8 mg total) by mouth 2 (two) times daily as needed for refractory nausea / vomiting. Start on day 3 after chemo. 30 tablet  1  . oxyCODONE-acetaminophen (PERCOCET) 5-325 MG tablet Take 1 tablet by mouth every 8 (eight) hours as needed for severe pain. 45 tablet 0  . OXYGEN Inhale 2 L into the lungs at bedtime.    . predniSONE (DELTASONE) 20 MG tablet Take 1 tablet (20 mg total) by  mouth daily. 5 tablet 0  . pregabalin (LYRICA) 50 MG capsule Take 1 capsule (50 mg total) by mouth 3 (three) times daily. 90 capsule 2  . prochlorperazine (COMPAZINE) 10 MG tablet Take 1 tablet (10 mg total) by mouth every 6 (six) hours as needed (Nausea or vomiting). 30 tablet 1  . Suvorexant (BELSOMRA) 20 MG TABS Take 20 mg by mouth at bedtime. 30 tablet 2  . traMADol (ULTRAM) 50 MG tablet Take 1 tablet (50 mg total) by mouth every 6 (six) hours as needed. 90 tablet 0   No current facility-administered medications for this visit.    Facility-Administered Medications Ordered in Other Visits  Medication Dose Route Frequency Provider Last Rate Last Dose  . CARBOplatin (PARAPLATIN) 270 mg in sodium chloride 0.9 % 250 mL chemo infusion  270 mg Intravenous Once Charlaine Dalton R, MD      . ipratropium-albuterol (DUONEB) 0.5-2.5 (3) MG/3ML nebulizer solution 3 mL  3 mL Nebulization Q6H Burns, Jennifer E, NP      . PACLitaxel (TAXOL) 90 mg in sodium chloride 0.9 % 250 mL chemo infusion (</= 80mg /m2)  45 mg/m2 (Treatment Plan Recorded) Intravenous Once Cammie Sickle, MD 265 mL/hr at 04/17/19 1119 90 mg at 04/17/19 1119    PHYSICAL EXAMINATION: ECOG PERFORMANCE STATUS: 1 - Symptomatic but completely ambulatory  BP 124/79 (BP Location: Right Arm, Patient Position: Sitting, Cuff Size: Normal)   Pulse 71   Temp 97.8 F (36.6 C) (Tympanic)   Resp 18   Ht 5\' 5"  (1.651 m)   Wt 197 lb (89.4 kg)   SpO2 97%   BMI 32.78 kg/m   Filed Weights   04/17/19 0839  Weight: 197 lb (89.4 kg)    Physical Exam  Constitutional: She is oriented to person, place, and time and well-developed, well-nourished, and in no distress.  She is alone.   She is walking herself.  Not on oxygen.  HENT:  Head: Normocephalic and atraumatic.  Mouth/Throat: Oropharynx is clear and moist. No oropharyngeal exudate.  Eyes: Pupils are equal, round, and reactive to light.  Neck: Normal range of motion. Neck supple.  Cardiovascular: Normal rate and regular rhythm.  Pulmonary/Chest: No respiratory distress. She has no wheezes.  Right anterior chest wall-tenderness.  No rash.  Abdominal: Soft. Bowel sounds are normal. She exhibits no distension and no mass. There is no abdominal tenderness. There is no rebound and no guarding.  Musculoskeletal: Normal range of motion.        General: No tenderness or edema.  Neurological: She is alert and oriented to person, place, and time.  Skin: Skin is warm.  Psychiatric: Affect normal.       LABORATORY DATA:  I have reviewed the data as listed    Component Value Date/Time   NA 139 04/17/2019 0811   NA 137 12/03/2017 1447   NA 137 05/14/2014 1457   K 3.8 04/17/2019 0811   K 3.2 (L) 05/14/2014 1457   CL 105 04/17/2019 0811   CL 103 05/14/2014 1457   CO2 26 04/17/2019 0811   CO2 28 05/14/2014 1457   GLUCOSE 130 (H) 04/17/2019 0811   GLUCOSE 80 05/14/2014 1457   BUN 12 04/17/2019 0811   BUN 8 12/03/2017 1447   BUN 8 05/14/2014 1457   CREATININE 0.63 04/17/2019 0811   CREATININE 0.79 05/14/2014 1457   CALCIUM 8.7 (L) 04/17/2019 0811   CALCIUM 8.9 05/14/2014 1457   PROT 7.1 04/17/2019 0811   PROT 6.8 12/03/2017 1447  PROT 7.0 05/14/2014 1457   ALBUMIN 3.5 04/17/2019 0811   ALBUMIN 3.8 12/03/2017 1447   ALBUMIN 3.3 (L) 05/14/2014 1457   AST 16 04/17/2019 0811   AST 9 (L) 05/14/2014 1457   ALT 11 04/17/2019 0811   ALT 15 05/14/2014 1457   ALKPHOS 90 04/17/2019 0811   ALKPHOS 87 05/14/2014 1457   BILITOT 0.4 04/17/2019 0811   BILITOT 0.6 12/03/2017 1447   BILITOT 0.2 05/14/2014 1457   GFRNONAA >60 04/17/2019 0811   GFRNONAA >60 05/14/2014 1457   GFRAA >60 04/17/2019 0811   GFRAA >60  05/14/2014 1457    No results found for: SPEP, UPEP  Lab Results  Component Value Date   WBC 5.9 04/17/2019   NEUTROABS 4.0 04/17/2019   HGB 10.8 (L) 04/17/2019   HCT 34.7 (L) 04/17/2019   MCV 82.0 04/17/2019   PLT 214 04/17/2019      Chemistry      Component Value Date/Time   NA 139 04/17/2019 0811   NA 137 12/03/2017 1447   NA 137 05/14/2014 1457   K 3.8 04/17/2019 0811   K 3.2 (L) 05/14/2014 1457   CL 105 04/17/2019 0811   CL 103 05/14/2014 1457   CO2 26 04/17/2019 0811   CO2 28 05/14/2014 1457   BUN 12 04/17/2019 0811   BUN 8 12/03/2017 1447   BUN 8 05/14/2014 1457   CREATININE 0.63 04/17/2019 0811   CREATININE 0.79 05/14/2014 1457      Component Value Date/Time   CALCIUM 8.7 (L) 04/17/2019 0811   CALCIUM 8.9 05/14/2014 1457   ALKPHOS 90 04/17/2019 0811   ALKPHOS 87 05/14/2014 1457   AST 16 04/17/2019 0811   AST 9 (L) 05/14/2014 1457   ALT 11 04/17/2019 0811   ALT 15 05/14/2014 1457   BILITOT 0.4 04/17/2019 0811   BILITOT 0.6 12/03/2017 1447   BILITOT 0.2 05/14/2014 1457       RADIOGRAPHIC STUDIES: I have personally reviewed the radiological images as listed and agreed with the findings in the report. No results found.   ASSESSMENT & PLAN:  Primary cancer of right lower lobe of lung (HCC) #Recurrent squamous cell carcinoma status post biopsy of right hilar lymph node-stage III.   #Proceed with carbotaxol cycle #1 weekly; along with radiation.  Again reviewed the potential side effects of chemotherapy including but not limited to nausea vomiting diarrhea fatigue risk of infections neuropathy.  # right chest wall pain- pleuritic; likely musculoskeletal unlikely PE.  Give a trial of Percocet every 8 hours.  # COPD-stable.  Continue inhalers at home.  # insominia/anxiety- on trazadone/ seroqul; klonipin--stable  # DISPOSITION:  # Treatment today # 1 week- carbo-Taxol/cbc/bmp # 2 weeks- MD- cbc/cmp;carbo-taxol- Dr.B    Orders Placed This  Encounter  Procedures  . CBC with Differential    Standing Status:   Standing    Number of Occurrences:   20    Standing Expiration Date:   04/16/2020  . Basic metabolic panel    Standing Status:   Future    Standing Expiration Date:   04/16/2020  . Comprehensive metabolic panel    Standing Status:   Future    Standing Expiration Date:   04/16/2020   All questions were answered. The patient knows to call the clinic with any problems, questions or concerns.      Cammie Sickle, MD 04/17/2019 11:34 AM

## 2019-04-17 NOTE — Progress Notes (Signed)
[facility]  Telephone:([phone] [phone]   Name: Susan Houston Date: 04/17/2019 MRN: [id number]  DOB: Jan 04, 1961  Patient Care Team: Valerie Roys, DO as PCP - General (Family Medicine) Anabel Bene, MD as Referring Physician (Neurology) Telford Nab, RN as Registered Nurse    REASON FOR CONSULTATION: Palliative Care consult requested for this 58 y.o. female with multiple medical problems including stage III squamous cell carcinoma of the right lower lobe lung cancer status post thoracotomy and concurrent carbo-taxol chemotherapy with RT.  Patient also has a history of laryngeal cancer status post CRT.  PMH also notable for COPD.  Patient was referred to palliative care to help address goals and manage ongoing symptoms.   SOCIAL HISTORY:     reports that she quit smoking about 3 years ago. Her smoking use included cigarettes. She smoked 0.50 packs per day. She has never used smokeless tobacco. She reports that she does not drink alcohol or use drugs.   Patient is married.  She lives at home with her husband.  ADVANCE DIRECTIVES:  Not on file  CODE STATUS:   PAST MEDICAL HISTORY: Past Medical History:  Diagnosis Date  . Anemia    vitamin b12 deficiency. no longer taking supplements  . Anxiety   . Arthritis   . Asthma   . Back pain   . Bruises easily   . Chronic leg pain    BILATERAL  . COPD (chronic obstructive pulmonary disease) (HCC)    no inhalers for over 1 year  . Depression   . Difficulty sleeping   . Diverticulitis   . Dyspnea    due to having part of lung removed in 2019  . Dysrhythmia 04/2018   brady episodes. cleared by dr. Clayborn Bigness  . Facet syndrome, lumbar 03/28/2015  . GERD (gastroesophageal reflux disease)   . Hemorrhoids   . History of kidney stones 04/2018   recently passed stone  . Hyperlipidemia   . Incontinence of urine   . Laryngeal cancer ([facility]) 2009   LARYNX CANCER - CHEMO /  RADIATION (NO SURGERY)   . Migraine   . Migraine without aura and without status migrainosus, not intractable 09/11/2016  . Mild cognitive impairment 11/23/2017  . MRSA infection greater than 3 months ago    2008 right side of face  . Nerve damage    right leg. thinks this is from her TKR  . Nodule of lower lobe of right lung 04/22/2018  . Oxygen deficiency    2L/HS  . Primary cancer of right lower lobe of lung ([facility]) 04/18/2018   Surgical resection of RLL  . Wears dentures    full upper and lower    PAST SURGICAL HISTORY:  Past Surgical History:  Procedure Laterality Date  . ABDOMINAL HYSTERECTOMY  1983  . BLADDER SUSPENSION  2014  . CATARACT EXTRACTION W/PHACO Left 05/11/2018   Procedure: CATARACT EXTRACTION PHACO AND INTRAOCULAR LENS PLACEMENT ([place]) LEFT;  Surgeon: Leandrew Koyanagi, MD;  Location: [facility];  Service: Ophthalmology;  Laterality: Left;  PREFERS EARLY  . CATARACT EXTRACTION W/PHACO Right 08/11/2018   Procedure: CATARACT EXTRACTION PHACO AND INTRAOCULAR LENS PLACEMENT ([facility]);  Surgeon: Leandrew Koyanagi, MD;  Location: [facility];  Service: Ophthalmology;  Laterality: Right;  CDE 5:02 Total Time 01:35 Fluid lot # [id number]  . CHOLECYSTECTOMY  1998  . COLONOSCOPY    . ENDOBRONCHIAL ULTRASOUND N/A 03/20/2019   Procedure: ENDOBRONCHIAL ULTRASOUND;  Surgeon: Flora Lipps, MD;  Location: [facility];  Service: Cardiopulmonary;  Laterality: N/A;  . ESOPHAGOGASTRODUODENOSCOPY  2015  . ESOPHAGOGASTRODUODENOSCOPY (EGD) WITH PROPOFOL N/A 11/20/2016   Procedure: ESOPHAGOGASTRODUODENOSCOPY (EGD) WITH PROPOFOL;  Surgeon: Jonathon Bellows, MD;  Location: [facility];  Service: Endoscopy;  Laterality: N/A;  . EXCISION NEUROMA Bilateral 09/24/2014   Procedure: BILATERAL OPEN SAPHENOUS NEURECTOMIES AND OPEN LEFT PERIPATELLA OSETOPHYTECTOMY;  Surgeon: Mauri Pole, MD;  Location: [facility];  Service: Orthopedics;  Laterality: Bilateral;  . EYE SURGERY Left 05/11/2018   cataract  extraction  . JOINT REPLACEMENT  2009/2010   BIL TOTAL KNEES  . JOINT REPLACEMENT  2020   left  . PORTACATH PLACEMENT N/A 04/05/2019   Procedure: INSERTION PORT-A-CATH;  Surgeon: Nestor Lewandowsky, MD;  Location: [facility];  Service: General;  Laterality: N/A;  . THORACOTOMY/LOBECTOMY Right 05/16/2018   Procedure: THORACOTOMY/POSSIBLE LOBECTOMY;  Surgeon: Nestor Lewandowsky, MD;  Location: [facility];  Service: Thoracic;  Laterality: Right;  . TONSILLECTOMY    . TOTAL KNEE REVISION Left 11/29/2018   Procedure: Left TOTAL KNEE REVISION;  Surgeon: Hessie Knows, MD;  Location: [facility];  Service: Orthopedics;  Laterality: Left;  Marland Kitchen VIDEO BRONCHOSCOPY N/A 05/16/2018   Procedure: PREOP  BRONCHOSCOPY;  Surgeon: Nestor Lewandowsky, MD;  Location: [facility];  Service: Thoracic;  Laterality: N/A;    HEMATOLOGY/ONCOLOGY HISTORY:  Oncology History Overview Note  # 2011-laryngeal cancer/SCC; Stage II; April-June 2011 s/p  CRT [Dr.Choski/Bennett]  # 2019- likely STAGE I 10 mm RLL nodule [incidental]; s/p wedge resection-July 2019-stage I squamous cell lung cancer. NO  Adjuvant therapy.  # May 28th 2020-stage III lung cancer-recurrent  RIGHT HILAR-- POSITIVE FOR MALIGNANT NEOPLASTIC CELLS. - CONSISTENT WITH METASTATIC SQUAMOUS CELL CARCINOMA.  #June 2020- Carbo-taxol- with RT  # COPD  # post thoracotomy syndrome  DIAGNOSIS: RLL lung ca  STAGE:  III ; GOALS: cure  CURRENT/MOST RECENT THERAPY: Carbo-taxol- RT planned   Laryngeal cancer ([place])  Primary cancer of right lower lobe of lung ([place])  05/27/2018 Initial Diagnosis   Primary cancer of right lower lobe of lung ([place])   04/17/2019 -  Chemotherapy   The patient had palonosetron (ALOXI) injection 0.25 mg, 0.25 mg, Intravenous,  Once, 1 of 8 cycles Administration: 0.25 mg (04/17/2019) CARBOplatin (PARAPLATIN) 270 mg in sodium chloride 0.9 % 250 mL chemo infusion, 270 mg (100 % of original dose 265.8 mg), Intravenous,  Once, 1 of 8 cycles Dose modification:    (original dose 265.8 mg, Cycle 1) Administration: 270 mg (04/17/2019) PACLitaxel (TAXOL) 90 mg in sodium chloride 0.9 % 250 mL chemo infusion (</= 27m/m2), 45 mg/m2 = 90 mg, Intravenous,  Once, 1 of 8 cycles Administration: 90 mg (04/17/2019)  for chemotherapy treatment.      ALLERGIES:  is allergic to morphine and related; cymbalta [duloxetine hcl]; adhesive [tape]; and lexapro [escitalopram oxalate].  MEDICATIONS:  Current Outpatient Medications  Medication Sig Dispense Refill  . albuterol (PROVENTIL HFA;VENTOLIN HFA) 108 (90 Base) MCG/ACT inhaler Inhale 2 puffs into the lungs every 4 (four) hours as needed for wheezing or shortness of breath. 1 Inhaler 2  . albuterol (PROVENTIL) (2.5 MG/3ML) 0.083% nebulizer solution Take 3 mLs (2.5 mg total) by nebulization every 4 (four) hours as needed for wheezing or shortness of breath. 75 mL 12  . aspirin EC 81 MG tablet Take 81 mg by mouth daily.    .Marland Kitchenaspirin-acetaminophen-caffeine (EXCEDRIN MIGRAINE) 250-250-65 MG tablet Take 1 tablet by mouth daily as needed for headache.    . clonazePAM (KLONOPIN) 0.5 MG tablet  Take 0.5-1 tablets (0.25-0.5 mg total) by mouth daily as needed for anxiety. Try to limit use and use only for severe anxiety and sleep problems 25 tablet 1  . estradiol (ESTRACE) 1 MG tablet Take 1 mg by mouth daily.     Marland Kitchen FLUoxetine (PROZAC) 20 MG capsule Take 1 capsule (20 mg total) by mouth daily. To be combined with 40 mg 90 capsule 1  . FLUoxetine (PROZAC) 40 MG capsule Take 1 capsule (40 mg total) by mouth daily. To be combined with 20 mg 90 capsule 1  . guaiFENesin-codeine 100-10 MG/5ML syrup Take 10 mLs by mouth 3 (three) times daily as needed for cough. 120 mL 0  . hydrOXYzine (VISTARIL) 25 MG capsule TAKE 1 CAPSULE(25 MG) BY MOUTH TWICE DAILY AS NEEDED FOR SEVERE ANXIETY OR SYMPTOMS 60 capsule 1  . lidocaine-prilocaine (EMLA) cream Apply to affected area once 30 g 3  . nystatin cream (MYCOSTATIN) Apply 1 application topically 2  (two) times daily. (Patient taking differently: Apply 1 application topically 2 (two) times daily as needed (rash). ) 30 g 0  . omeprazole (PRILOSEC) 40 MG capsule Take 1 capsule (40 mg total) by mouth daily. (Patient taking differently: Take 40 mg by mouth every morning. ) 90 capsule 1  . ondansetron (ZOFRAN) 8 MG tablet Take 1 tablet (8 mg total) by mouth 2 (two) times daily as needed for refractory nausea / vomiting. Start on day 3 after chemo. 30 tablet 1  . oxyCODONE-acetaminophen (PERCOCET) 5-325 MG tablet Take 1 tablet by mouth every 8 (eight) hours as needed for severe pain. 45 tablet 0  . OXYGEN Inhale 2 L into the lungs at bedtime.    . predniSONE (DELTASONE) 20 MG tablet Take 1 tablet (20 mg total) by mouth daily. 5 tablet 0  . pregabalin (LYRICA) 50 MG capsule Take 1 capsule (50 mg total) by mouth 3 (three) times daily. 90 capsule 2  . prochlorperazine (COMPAZINE) 10 MG tablet Take 1 tablet (10 mg total) by mouth every 6 (six) hours as needed (Nausea or vomiting). 30 tablet 1  . Suvorexant (BELSOMRA) 20 MG TABS Take 20 mg by mouth at bedtime. 30 tablet 2  . traMADol (ULTRAM) 50 MG tablet Take 1 tablet (50 mg total) by mouth every 6 (six) hours as needed. 90 tablet 0   No current facility-administered medications for this visit.    Facility-Administered Medications Ordered in Other Visits  Medication Dose Route Frequency Provider Last Rate Last Dose  . ipratropium-albuterol (DUONEB) 0.5-2.5 (3) MG/3ML nebulizer solution 3 mL  3 mL Nebulization Q6H Burns, Jennifer E, NP        VITAL SIGNS: There were no vitals taken for this visit. There were no vitals filed for this visit.  Estimated body mass index is 32.78 kg/m as calculated from the following:   Height as of an earlier encounter on 04/17/19: _0  (1.651 m).   Weight as of an earlier encounter on 04/17/19: 197 lb (89.4 kg).  LABS: CBC:    Component Value Date/Time   WBC 5.9 04/17/2019 0811   HGB 10.8 (L) 04/17/2019 0811    HGB 12.4 12/03/2017 1447   HCT 34.7 (L) 04/17/2019 0811   HCT 38.2 12/03/2017 1447   PLT 214 04/17/2019 0811   PLT 221 12/03/2017 1447   MCV 82.0 04/17/2019 0811   MCV 91 12/03/2017 1447   MCV 93 05/14/2014 1457   NEUTROABS 4.0 04/17/2019 0811   NEUTROABS 2.3 12/03/2017 1447   NEUTROABS 6.8 (  H) 05/14/2014 1457   LYMPHSABS 1.1 04/17/2019 0811   LYMPHSABS 1.2 12/03/2017 1447   LYMPHSABS 1.8 05/14/2014 1457   MONOABS 0.5 04/17/2019 0811   MONOABS 0.6 05/14/2014 1457   EOSABS 0.2 04/17/2019 0811   EOSABS 0.1 12/03/2017 1447   EOSABS 0.1 05/14/2014 1457   BASOSABS 0.0 04/17/2019 0811   BASOSABS 0.1 12/03/2017 1447   BASOSABS 0.1 05/14/2014 1457   Comprehensive Metabolic Panel:    Component Value Date/Time   NA 139 04/17/2019 0811   NA 137 12/03/2017 1447   NA 137 05/14/2014 1457   K 3.8 04/17/2019 0811   K 3.2 (L) 05/14/2014 1457   CL 105 04/17/2019 0811   CL 103 05/14/2014 1457   CO2 26 04/17/2019 0811   CO2 28 05/14/2014 1457   BUN 12 04/17/2019 0811   BUN 8 12/03/2017 1447   BUN 8 05/14/2014 1457   CREATININE 0.63 04/17/2019 0811   CREATININE 0.79 05/14/2014 1457   GLUCOSE 130 (H) 04/17/2019 0811   GLUCOSE 80 05/14/2014 1457   CALCIUM 8.7 (L) 04/17/2019 0811   CALCIUM 8.9 05/14/2014 1457   AST 16 04/17/2019 0811   AST 9 (L) 05/14/2014 1457   ALT 11 04/17/2019 0811   ALT 15 05/14/2014 1457   ALKPHOS 90 04/17/2019 0811   ALKPHOS 87 05/14/2014 1457   BILITOT 0.4 04/17/2019 0811   BILITOT 0.6 12/03/2017 1447   BILITOT 0.2 05/14/2014 1457   PROT 7.1 04/17/2019 0811   PROT 6.8 12/03/2017 1447   PROT 7.0 05/14/2014 1457   ALBUMIN 3.5 04/17/2019 0811   ALBUMIN 3.8 12/03/2017 1447   ALBUMIN 3.3 (L) 05/14/2014 1457    RADIOGRAPHIC STUDIES: Dg Chest Port 1 View  Result Date: 04/05/2019 CLINICAL DATA:  Postoperative study. EXAM: PORTABLE CHEST 1 VIEW COMPARISON:  November 12, 2018 FINDINGS: The left Port-A-Cath terminates in the central SVC. Postoperative changes in  the right base. No pneumothorax. The cardiomediastinal silhouette is normal. IMPRESSION: The left Port-A-Cath terminates in the central SVC. No pneumothorax. No other acute abnormalities. Electronically Signed   By: Dorise Bullion III M.D   On: 04/05/2019 11:44   Dg C-arm 1-60 Min-no Report  Result Date: 04/05/2019 Fluoroscopy was utilized by the requesting physician.  No radiographic interpretation.    PERFORMANCE STATUS (ECOG) : 1 - Symptomatic but completely ambulatory  Review of Systems Unless otherwise noted, a complete review of systems is negative.  Physical Exam General: NAD, frail appearing, thin Pulmonary: unlabored Extremities: no edema Skin: no rashes Neurological: Weakness but otherwise nonfocal  IMPRESSION: I met with patient today while she was in the infusion area.  Introduced palliative care services and attempted to establish therapeutic rapport.  Patient says that she has been having some right-sided pleuritic pain.  I note that patient saw Dr. Rogue Bussing earlier today and has been started on Percocet.  Patient also complains of occasional sore throat, which she attributes to her history of radiation.  She denies any other symptoms.  No oral lesions or difficulty swallowing. Patient would be interested in trial of Magic mouthwash.  Functionally, patient reports doing well.  She says she is functionally independent at home.  She still drives.  I note that treatment is with curative intent but only 10 to 20% chance.  Will speak with patient ongoing in more depth regarding her specific goals.  I reviewed with patient a MOST form, which she took home to discuss with her husband.  Case discussed with Dr. Rogue Bussing  PLAN: -Continue current  scope of treatment -Agree with Percocet as needed for pain -Trial of Magic mouthwash -Most form reviewed -RTC in 2 weeks   Patient expressed understanding and was in agreement with this plan. She also understands that She can  call the clinic at any time with any questions, concerns, or complaints.     Time Total: 30 minutes  Visit consisted of counseling and education dealing with the complex and emotionally intense issues of symptom management and palliative care in the setting of serious and potentially life-threatening illness.Greater than 50%  of this time was spent counseling and coordinating care related to the above assessment and plan.  Signed by: Altha Harm, PhD, NP-C [phone] (Work Cell)

## 2019-04-17 NOTE — Progress Notes (Signed)
New chemo start hand off provided to Kerri Perches, RN by Renita Papa, RN

## 2019-04-17 NOTE — Progress Notes (Signed)
  Oncology Nurse Navigator Documentation  Navigator Location: [facility]Med Onc (04/17/19 1100)   )Navigator Encounter Type: Treatment (04/17/19 1100)                   Treatment Initiated Date: 04/11/19 (04/17/19 1100) Patient Visit Type: MedOnc (04/17/19 1100) Treatment Phase: First Chemo Tx (04/17/19 1100) Barriers/Navigation Needs: No Barriers At This Time;No Needs;No Questions (04/17/19 1100)   Interventions: None Required (04/17/19 1100)                      Time Spent with Patient: 30 (04/17/19 1100)

## 2019-04-18 ENCOUNTER — Ambulatory Visit: Payer: Medicare Other | Admitting: Gastroenterology

## 2019-04-18 ENCOUNTER — Other Ambulatory Visit: Payer: Self-pay | Admitting: *Deleted

## 2019-04-18 ENCOUNTER — Other Ambulatory Visit: Payer: Self-pay

## 2019-04-18 ENCOUNTER — Ambulatory Visit
Admission: RE | Admit: 2019-04-18 | Discharge: 2019-04-18 | Disposition: A | Discharge status: Home / Self Care | Payer: Medicare Other | Source: Ambulatory Visit | Attending: Radiation Oncology | Admitting: Radiation Oncology

## 2019-04-18 DIAGNOSIS — K5900 Constipation, unspecified: Secondary | ICD-10-CM

## 2019-04-18 DIAGNOSIS — Z86010 Personal history of colonic polyps: Secondary | ICD-10-CM

## 2019-04-18 DIAGNOSIS — C3431 Malignant neoplasm of lower lobe, right bronchus or lung: Secondary | ICD-10-CM | POA: Diagnosis not present

## 2019-04-18 DIAGNOSIS — R1310 Dysphagia, unspecified: Secondary | ICD-10-CM

## 2019-04-18 DIAGNOSIS — Z87891 Personal history of nicotine dependence: Secondary | ICD-10-CM | POA: Diagnosis not present

## 2019-04-18 DIAGNOSIS — Z510 Encounter for antineoplastic radiation therapy: Secondary | ICD-10-CM | POA: Diagnosis not present

## 2019-04-18 DIAGNOSIS — R1319 Other dysphagia: Secondary | ICD-10-CM

## 2019-04-18 DIAGNOSIS — C771 Secondary and unspecified malignant neoplasm of intrathoracic lymph nodes: Secondary | ICD-10-CM | POA: Diagnosis not present

## 2019-04-18 MED ORDER — SUCRALFATE 1 G PO TABS: 1.0000 g | ORAL_TABLET | Freq: Three times a day (TID) | ORAL | 3 refills | Status: AC

## 2019-04-18 MED ORDER — NA SULFATE-K SULFATE-MG SULF 17.5-3.13-1.6 GM/177ML PO SOLN: 1.0000 | Freq: Once | ORAL | 0 refills | Status: AC

## 2019-04-19 ENCOUNTER — Other Ambulatory Visit: Payer: Self-pay

## 2019-04-19 ENCOUNTER — Ambulatory Visit
Admission: RE | Admit: 2019-04-19 | Discharge: 2019-04-19 | Disposition: A | Discharge status: Home / Self Care | Payer: Medicare Other | Source: Ambulatory Visit | Attending: Radiation Oncology | Admitting: Radiation Oncology

## 2019-04-19 DIAGNOSIS — Z87891 Personal history of nicotine dependence: Secondary | ICD-10-CM | POA: Diagnosis not present

## 2019-04-19 DIAGNOSIS — C771 Secondary and unspecified malignant neoplasm of intrathoracic lymph nodes: Secondary | ICD-10-CM | POA: Diagnosis not present

## 2019-04-19 DIAGNOSIS — C3431 Malignant neoplasm of lower lobe, right bronchus or lung: Secondary | ICD-10-CM | POA: Insufficient documentation

## 2019-04-19 DIAGNOSIS — Z510 Encounter for antineoplastic radiation therapy: Secondary | ICD-10-CM | POA: Insufficient documentation

## 2019-04-20 ENCOUNTER — Ambulatory Visit
Admission: RE | Admit: 2019-04-20 | Discharge: 2019-04-20 | Disposition: A | Discharge status: Home / Self Care | Payer: Medicare Other | Source: Ambulatory Visit | Attending: Radiation Oncology | Admitting: Radiation Oncology

## 2019-04-20 ENCOUNTER — Other Ambulatory Visit: Payer: Self-pay

## 2019-04-20 DIAGNOSIS — Z510 Encounter for antineoplastic radiation therapy: Secondary | ICD-10-CM | POA: Diagnosis not present

## 2019-04-20 DIAGNOSIS — C3431 Malignant neoplasm of lower lobe, right bronchus or lung: Secondary | ICD-10-CM | POA: Diagnosis not present

## 2019-04-20 DIAGNOSIS — C771 Secondary and unspecified malignant neoplasm of intrathoracic lymph nodes: Secondary | ICD-10-CM | POA: Diagnosis not present

## 2019-04-20 DIAGNOSIS — Z87891 Personal history of nicotine dependence: Secondary | ICD-10-CM | POA: Diagnosis not present

## 2019-04-21 ENCOUNTER — Ambulatory Visit: Payer: Medicare Other

## 2019-04-24 ENCOUNTER — Other Ambulatory Visit: Payer: Self-pay | Admitting: Internal Medicine

## 2019-04-24 ENCOUNTER — Ambulatory Visit
Admission: RE | Admit: 2019-04-24 | Discharge: 2019-04-24 | Disposition: A | Discharge status: Home / Self Care | Payer: Medicare Other | Source: Ambulatory Visit | Attending: Radiation Oncology | Admitting: Radiation Oncology

## 2019-04-24 ENCOUNTER — Other Ambulatory Visit: Payer: Self-pay

## 2019-04-24 ENCOUNTER — Inpatient Hospital Stay: Payer: Medicare Other

## 2019-04-24 ENCOUNTER — Inpatient Hospital Stay (HOSPITAL_BASED_OUTPATIENT_CLINIC_OR_DEPARTMENT_OTHER): Payer: Medicare Other | Admitting: Nurse Practitioner

## 2019-04-24 ENCOUNTER — Inpatient Hospital Stay: Payer: Medicare Other | Attending: Internal Medicine

## 2019-04-24 VITALS — BP 111/74 | Temp 96.4°F | Resp 20 | Ht 65.0 in | Wt 200.0 lb

## 2019-04-24 DIAGNOSIS — E785 Hyperlipidemia, unspecified: Secondary | ICD-10-CM

## 2019-04-24 DIAGNOSIS — C771 Secondary and unspecified malignant neoplasm of intrathoracic lymph nodes: Secondary | ICD-10-CM | POA: Diagnosis not present

## 2019-04-24 DIAGNOSIS — Z7982 Long term (current) use of aspirin: Secondary | ICD-10-CM | POA: Diagnosis not present

## 2019-04-24 DIAGNOSIS — F419 Anxiety disorder, unspecified: Secondary | ICD-10-CM | POA: Insufficient documentation

## 2019-04-24 DIAGNOSIS — Z8521 Personal history of malignant neoplasm of larynx: Secondary | ICD-10-CM | POA: Diagnosis not present

## 2019-04-24 DIAGNOSIS — K219 Gastro-esophageal reflux disease without esophagitis: Secondary | ICD-10-CM | POA: Insufficient documentation

## 2019-04-24 DIAGNOSIS — Z95828 Presence of other vascular implants and grafts: Secondary | ICD-10-CM

## 2019-04-24 DIAGNOSIS — R112 Nausea with vomiting, unspecified: Secondary | ICD-10-CM | POA: Insufficient documentation

## 2019-04-24 DIAGNOSIS — Z515 Encounter for palliative care: Secondary | ICD-10-CM | POA: Diagnosis not present

## 2019-04-24 DIAGNOSIS — Z7189 Other specified counseling: Secondary | ICD-10-CM | POA: Insufficient documentation

## 2019-04-24 DIAGNOSIS — F329 Major depressive disorder, single episode, unspecified: Secondary | ICD-10-CM

## 2019-04-24 DIAGNOSIS — C3431 Malignant neoplasm of lower lobe, right bronchus or lung: Secondary | ICD-10-CM

## 2019-04-24 DIAGNOSIS — Z5111 Encounter for antineoplastic chemotherapy: Secondary | ICD-10-CM | POA: Insufficient documentation

## 2019-04-24 DIAGNOSIS — J449 Chronic obstructive pulmonary disease, unspecified: Secondary | ICD-10-CM

## 2019-04-24 DIAGNOSIS — K521 Toxic gastroenteritis and colitis: Secondary | ICD-10-CM | POA: Diagnosis not present

## 2019-04-24 DIAGNOSIS — R0789 Other chest pain: Secondary | ICD-10-CM | POA: Insufficient documentation

## 2019-04-24 DIAGNOSIS — R5383 Other fatigue: Secondary | ICD-10-CM | POA: Diagnosis not present

## 2019-04-24 DIAGNOSIS — Z87891 Personal history of nicotine dependence: Secondary | ICD-10-CM | POA: Insufficient documentation

## 2019-04-24 DIAGNOSIS — R400 Somnolence: Secondary | ICD-10-CM | POA: Diagnosis not present

## 2019-04-24 DIAGNOSIS — Z79899 Other long term (current) drug therapy: Secondary | ICD-10-CM | POA: Insufficient documentation

## 2019-04-24 DIAGNOSIS — Z510 Encounter for antineoplastic radiation therapy: Secondary | ICD-10-CM | POA: Diagnosis not present

## 2019-04-24 LAB — CBC WITH DIFFERENTIAL/PLATELET
Abs Immature Granulocytes: 0.02 10*3/uL (ref 0.00–0.07)
Basophils Absolute: 0 10*3/uL (ref 0.0–0.1)
Basophils Relative: 0 %
Eosinophils Absolute: 0.2 10*3/uL (ref 0.0–0.5)
Eosinophils Relative: 4 %
HCT: 31.2 % — ABNORMAL LOW (ref 36.0–46.0)
Hemoglobin: 9.7 g/dL — ABNORMAL LOW (ref 12.0–15.0)
Immature Granulocytes: 0 %
Lymphocytes Relative: 8 %
Lymphs Abs: 0.4 10*3/uL — ABNORMAL LOW (ref 0.7–4.0)
MCH: 25.6 pg — ABNORMAL LOW (ref 26.0–34.0)
MCHC: 31.1 g/dL (ref 30.0–36.0)
MCV: 82.3 fL (ref 80.0–100.0)
Monocytes Absolute: 0.2 10*3/uL (ref 0.1–1.0)
Monocytes Relative: 5 %
Neutro Abs: 3.7 10*3/uL (ref 1.7–7.7)
Neutrophils Relative %: 83 %
Platelets: 157 10*3/uL (ref 150–400)
RBC: 3.79 MIL/uL — ABNORMAL LOW (ref 3.87–5.11)
RDW: 14.3 % (ref 11.5–15.5)
WBC: 4.5 10*3/uL (ref 4.0–10.5)
nRBC: 0 % (ref 0.0–0.2)

## 2019-04-24 LAB — BASIC METABOLIC PANEL
Anion gap: 8 (ref 5–15)
BUN: 11 mg/dL (ref 6–20)
CO2: 27 mmol/L (ref 22–32)
Calcium: 8.6 mg/dL — ABNORMAL LOW (ref 8.9–10.3)
Chloride: 102 mmol/L (ref 98–111)
Creatinine, Ser: 0.61 mg/dL (ref 0.44–1.00)
GFR calc Af Amer: >60 mL/min (ref >60)
GFR calc non Af Amer: >60 mL/min (ref >60)
Glucose, Bld: 117 mg/dL — ABNORMAL HIGH (ref 70–99)
Potassium: 3.8 mmol/L (ref 3.5–5.1)
Sodium: 137 mmol/L (ref 135–145)

## 2019-04-24 MED ORDER — PALONOSETRON HCL INJECTION 0.25 MG/5ML
0.2500 mg | Freq: Once | INTRAVENOUS | Status: AC
  Administered 2019-04-24: 0.25 mg via INTRAVENOUS
  Filled 2019-04-24: qty 5

## 2019-04-24 MED ORDER — FAMOTIDINE IN NACL 20-0.9 MG/50ML-% IV SOLN
20.0000 mg | Freq: Once | INTRAVENOUS | Status: AC
  Administered 2019-04-24: 20 mg via INTRAVENOUS
  Filled 2019-04-24: qty 50

## 2019-04-24 MED ORDER — SODIUM CHLORIDE 0.9 % IV SOLN
20.0000 mg | Freq: Once | INTRAVENOUS | Status: AC
  Administered 2019-04-24: 11:00:00 20 mg via INTRAVENOUS
  Filled 2019-04-24: qty 2

## 2019-04-24 MED ORDER — SODIUM CHLORIDE 0.9% FLUSH
10.0000 mL | Freq: Once | INTRAVENOUS | Status: AC
  Administered 2019-04-24: 10 mL via INTRAVENOUS
  Filled 2019-04-24: qty 10

## 2019-04-24 MED ORDER — SODIUM CHLORIDE 0.9 % IV SOLN
265.8000 mg | Freq: Once | INTRAVENOUS | Status: AC
  Administered 2019-04-24: 13:00:00 270 mg via INTRAVENOUS
  Filled 2019-04-24: qty 27

## 2019-04-24 MED ORDER — DIPHENHYDRAMINE HCL 50 MG/ML IJ SOLN
50.0000 mg | Freq: Once | INTRAMUSCULAR | Status: AC
  Administered 2019-04-24: 50 mg via INTRAVENOUS
  Filled 2019-04-24: qty 1

## 2019-04-24 MED ORDER — SODIUM CHLORIDE 0.9 % IV SOLN
45.0000 mg/m2 | Freq: Once | INTRAVENOUS | Status: AC
  Administered 2019-04-24: 90 mg via INTRAVENOUS
  Filled 2019-04-24: qty 15

## 2019-04-24 MED ORDER — SODIUM CHLORIDE 0.9 % IV SOLN
Freq: Once | INTRAVENOUS | Status: AC
  Administered 2019-04-24: 10:00:00 via INTRAVENOUS
  Filled 2019-04-24: qty 250

## 2019-04-24 MED ORDER — HEPARIN SOD (PORK) LOCK FLUSH 100 UNIT/ML IV SOLN
500.0000 [IU] | Freq: Once | INTRAVENOUS | Status: AC | PRN
  Administered 2019-04-24: 13:00:00 500 [IU]
  Filled 2019-04-24: qty 5

## 2019-04-24 NOTE — Progress Notes (Signed)
[facility]  Telephone:([phone] [phone]   Name: Susan Houston Date: 04/24/2019 MRN: [id number]  DOB: 12-Jun-1961  Patient Care Team: Valerie Roys, DO as PCP - General (Family Medicine) Anabel Bene, MD as Referring Physician (Neurology) Telford Nab, RN as Registered Nurse    REASON FOR CONSULTATION: Palliative Care consult requested for this 58 y.o. female with multiple medical problems including stage III squamous cell carcinoma of the right lower lobe lung cancer status post thoracotomy and concurrent carbo-taxol chemotherapy with RT.  Patient also has a history of laryngeal cancer status post CRT.  PMH also notable for COPD.  Patient was referred to palliative care to help address goals and manage ongoing symptoms.   SOCIAL HISTORY:     reports that she quit smoking about 3 years ago. Her smoking use included cigarettes. She smoked 0.50 packs per day. She has never used smokeless tobacco. She reports that she does not drink alcohol or use drugs.   Patient is married.  She lives at home with her husband.  ADVANCE DIRECTIVES:  Not on file  CODE STATUS:   PAST MEDICAL HISTORY: Past Medical History:  Diagnosis Date  . Anemia    vitamin b12 deficiency. no longer taking supplements  . Anxiety   . Arthritis   . Asthma   . Back pain   . Bruises easily   . Chronic leg pain    BILATERAL  . COPD (chronic obstructive pulmonary disease) (HCC)    no inhalers for over 1 year  . Depression   . Difficulty sleeping   . Diverticulitis   . Dyspnea    due to having part of lung removed in 2019  . Dysrhythmia 04/2018   brady episodes. cleared by dr. Clayborn Bigness  . Facet syndrome, lumbar 03/28/2015  . GERD (gastroesophageal reflux disease)   . Hemorrhoids   . History of kidney stones 04/2018   recently passed stone  . Hyperlipidemia   . Incontinence of urine   . Laryngeal cancer ([place]) 2009   LARYNX CANCER - CHEMO /  RADIATION (NO SURGERY)   . Migraine   . Migraine without aura and without status migrainosus, not intractable 09/11/2016  . Mild cognitive impairment 11/23/2017  . MRSA infection greater than 3 months ago    2008 right side of face  . Nerve damage    right leg. thinks this is from her TKR  . Nodule of lower lobe of right lung 04/22/2018  . Oxygen deficiency    2L/HS  . Primary cancer of right lower lobe of lung ([place]) 04/18/2018   Surgical resection of RLL  . Wears dentures    full upper and lower    PAST SURGICAL HISTORY:  Past Surgical History:  Procedure Laterality Date  . ABDOMINAL HYSTERECTOMY  1983  . BLADDER SUSPENSION  2014  . CATARACT EXTRACTION W/PHACO Left 05/11/2018   Procedure: CATARACT EXTRACTION PHACO AND INTRAOCULAR LENS PLACEMENT ([place]) LEFT;  Surgeon: Leandrew Koyanagi, MD;  Location: [facility];  Service: Ophthalmology;  Laterality: Left;  PREFERS EARLY  . CATARACT EXTRACTION W/PHACO Right 08/11/2018   Procedure: CATARACT EXTRACTION PHACO AND INTRAOCULAR LENS PLACEMENT ([facility]);  Surgeon: Leandrew Koyanagi, MD;  Location: [facility];  Service: Ophthalmology;  Laterality: Right;  CDE 5:02 Total Time 01:35 Fluid lot # [id number]  . CHOLECYSTECTOMY  1998  . COLONOSCOPY    . ENDOBRONCHIAL ULTRASOUND N/A 03/20/2019   Procedure: ENDOBRONCHIAL ULTRASOUND;  Surgeon: Flora Lipps, MD;  Location: [facility];  Service: Cardiopulmonary;  Laterality: N/A;  . ESOPHAGOGASTRODUODENOSCOPY  2015  . ESOPHAGOGASTRODUODENOSCOPY (EGD) WITH PROPOFOL N/A 11/20/2016   Procedure: ESOPHAGOGASTRODUODENOSCOPY (EGD) WITH PROPOFOL;  Surgeon: Jonathon Bellows, MD;  Location: [facility];  Service: Endoscopy;  Laterality: N/A;  . EXCISION NEUROMA Bilateral 09/24/2014   Procedure: BILATERAL OPEN SAPHENOUS NEURECTOMIES AND OPEN LEFT PERIPATELLA OSETOPHYTECTOMY;  Surgeon: Mauri Pole, MD;  Location: [facility];  Service: Orthopedics;  Laterality: Bilateral;  . EYE SURGERY Left 05/11/2018   cataract  extraction  . JOINT REPLACEMENT  2009/2010   BIL TOTAL KNEES  . JOINT REPLACEMENT  2020   left  . PORTACATH PLACEMENT N/A 04/05/2019   Procedure: INSERTION PORT-A-CATH;  Surgeon: Nestor Lewandowsky, MD;  Location: [facility];  Service: General;  Laterality: N/A;  . THORACOTOMY/LOBECTOMY Right 05/16/2018   Procedure: THORACOTOMY/POSSIBLE LOBECTOMY;  Surgeon: Nestor Lewandowsky, MD;  Location: [facility];  Service: Thoracic;  Laterality: Right;  . TONSILLECTOMY    . TOTAL KNEE REVISION Left 11/29/2018   Procedure: Left TOTAL KNEE REVISION;  Surgeon: Hessie Knows, MD;  Location: [facility];  Service: Orthopedics;  Laterality: Left;  Marland Kitchen VIDEO BRONCHOSCOPY N/A 05/16/2018   Procedure: PREOP  BRONCHOSCOPY;  Surgeon: Nestor Lewandowsky, MD;  Location: [facility];  Service: Thoracic;  Laterality: N/A;    HEMATOLOGY/ONCOLOGY HISTORY:  Oncology History Overview Note  # 2011-laryngeal cancer/SCC; Stage II; April-June 2011 s/p  CRT [Dr.Choski/[facility]]  # 2019- likely STAGE I 10 mm RLL nodule [incidental]; s/p wedge resection-July 2019-stage I squamous cell lung cancer. NO  Adjuvant therapy.  # May 28th 2020-stage III lung cancer-recurrent  RIGHT HILAR-- POSITIVE FOR MALIGNANT NEOPLASTIC CELLS. - CONSISTENT WITH METASTATIC SQUAMOUS CELL CARCINOMA.  #June 2020- Carbo-taxol- with RT  # COPD  # post thoracotomy syndrome  DIAGNOSIS: RLL lung ca  STAGE:  III ; GOALS: cure  CURRENT/MOST RECENT THERAPY: Carbo-taxol- RT planned   Laryngeal cancer ([place])  Primary cancer of right lower lobe of lung ([place])  05/27/2018 Initial Diagnosis   Primary cancer of right lower lobe of lung ([place])   04/17/2019 -  Chemotherapy   The patient had palonosetron (ALOXI) injection 0.25 mg, 0.25 mg, Intravenous,  Once, 2 of 8 cycles Administration: 0.25 mg (04/17/2019), 0.25 mg (04/24/2019) CARBOplatin (PARAPLATIN) 270 mg in sodium chloride 0.9 % 250 mL chemo infusion, 270 mg (100 % of original dose 265.8 mg), Intravenous,  Once, 2 of 8 cycles Dose  modification:   (original dose 265.8 mg, Cycle 1) Administration: 270 mg (04/17/2019), 270 mg (04/24/2019) PACLitaxel (TAXOL) 90 mg in sodium chloride 0.9 % 250 mL chemo infusion (</= 30m/m2), 45 mg/m2 = 90 mg, Intravenous,  Once, 2 of 8 cycles Administration: 90 mg (04/17/2019), 90 mg (04/24/2019)  for chemotherapy treatment.      ALLERGIES:  is allergic to morphine and related; cymbalta [duloxetine hcl]; adhesive [tape]; and lexapro [escitalopram oxalate].  MEDICATIONS:  Current Outpatient Medications  Medication Sig Dispense Refill  . albuterol (PROVENTIL HFA;VENTOLIN HFA) 108 (90 Base) MCG/ACT inhaler Inhale 2 puffs into the lungs every 4 (four) hours as needed for wheezing or shortness of breath. 1 Inhaler 2  . albuterol (PROVENTIL) (2.5 MG/3ML) 0.083% nebulizer solution Take 3 mLs (2.5 mg total) by nebulization every 4 (four) hours as needed for wheezing or shortness of breath. 75 mL 12  . aspirin EC 81 MG tablet Take 81 mg by mouth daily.    .Marland Kitchenaspirin-acetaminophen-caffeine (EXCEDRIN MIGRAINE) 250-250-65 MG tablet Take 1 tablet by mouth daily as needed for headache.    .Marland Kitchen  clonazePAM (KLONOPIN) 0.5 MG tablet Take 0.5-1 tablets (0.25-0.5 mg total) by mouth daily as needed for anxiety. Try to limit use and use only for severe anxiety and sleep problems 25 tablet 1  . estradiol (ESTRACE) 1 MG tablet Take 1 mg by mouth daily.     Marland Kitchen FLUoxetine (PROZAC) 20 MG capsule Take 1 capsule (20 mg total) by mouth daily. To be combined with 40 mg 90 capsule 1  . FLUoxetine (PROZAC) 40 MG capsule Take 1 capsule (40 mg total) by mouth daily. To be combined with 20 mg 90 capsule 1  . guaiFENesin-codeine 100-10 MG/5ML syrup Take 10 mLs by mouth 3 (three) times daily as needed for cough. 120 mL 0  . hydrOXYzine (VISTARIL) 25 MG capsule TAKE 1 CAPSULE(25 MG) BY MOUTH TWICE DAILY AS NEEDED FOR SEVERE ANXIETY OR SYMPTOMS 60 capsule 1  . lidocaine-prilocaine (EMLA) cream Apply to affected area once 30 g 3  . magic  mouthwash SOLN Take 5 mLs by mouth 3 (three) times daily as needed for mouth pain. 240 mL 0  . nystatin cream (MYCOSTATIN) Apply 1 application topically 2 (two) times daily. (Patient taking differently: Apply 1 application topically 2 (two) times daily as needed (rash). ) 30 g 0  . omeprazole (PRILOSEC) 40 MG capsule Take 1 capsule (40 mg total) by mouth daily. (Patient taking differently: Take 40 mg by mouth every morning. ) 90 capsule 1  . ondansetron (ZOFRAN) 8 MG tablet Take 1 tablet (8 mg total) by mouth 2 (two) times daily as needed for refractory nausea / vomiting. Start on day 3 after chemo. 30 tablet 1  . oxyCODONE-acetaminophen (PERCOCET) 5-325 MG tablet Take 1 tablet by mouth every 8 (eight) hours as needed for severe pain. 45 tablet 0  . OXYGEN Inhale 2 L into the lungs at bedtime.    . predniSONE (DELTASONE) 20 MG tablet Take 1 tablet (20 mg total) by mouth daily. 5 tablet 0  . pregabalin (LYRICA) 50 MG capsule Take 1 capsule (50 mg total) by mouth 3 (three) times daily. 90 capsule 2  . prochlorperazine (COMPAZINE) 10 MG tablet Take 1 tablet (10 mg total) by mouth every 6 (six) hours as needed (Nausea or vomiting). 30 tablet 1  . sucralfate (CARAFATE) 1 g tablet Take 1 tablet (1 g total) by mouth 3 (three) times daily. Dissolve in 3-4 tbsp warm water, swish and swallow. 90 tablet 3  . Suvorexant (BELSOMRA) 20 MG TABS Take 20 mg by mouth at bedtime. 30 tablet 2  . traMADol (ULTRAM) 50 MG tablet Take 1 tablet (50 mg total) by mouth every 6 (six) hours as needed. 90 tablet 0   No current facility-administered medications for this visit.    Facility-Administered Medications Ordered in Other Visits  Medication Dose Route Frequency Provider Last Rate Last Dose  . ipratropium-albuterol (DUONEB) 0.5-2.5 (3) MG/3ML nebulizer solution 3 mL  3 mL Nebulization Q6H Burns, Jennifer E, NP        VITAL SIGNS: There were no vitals taken for this visit. There were no vitals filed for this visit.   Estimated body mass index is 33.28 kg/m as calculated from the following:   Height as of an earlier encounter on 04/24/19: _0  (1.651 m).   Weight as of an earlier encounter on 04/24/19: 200 lb (90.7 kg).  LABS: CBC:    Component Value Date/Time   WBC 4.5 04/24/2019 0904   HGB 9.7 (L) 04/24/2019 0904   HGB 12.4 12/03/2017 1447  HCT 31.2 (L) 04/24/2019 0904   HCT 38.2 12/03/2017 1447   PLT 157 04/24/2019 0904   PLT 221 12/03/2017 1447   MCV 82.3 04/24/2019 0904   MCV 91 12/03/2017 1447   MCV 93 05/14/2014 1457   NEUTROABS 3.7 04/24/2019 0904   NEUTROABS 2.3 12/03/2017 1447   NEUTROABS 6.8 (H) 05/14/2014 1457   LYMPHSABS 0.4 (L) 04/24/2019 0904   LYMPHSABS 1.2 12/03/2017 1447   LYMPHSABS 1.8 05/14/2014 1457   MONOABS 0.2 04/24/2019 0904   MONOABS 0.6 05/14/2014 1457   EOSABS 0.2 04/24/2019 0904   EOSABS 0.1 12/03/2017 1447   EOSABS 0.1 05/14/2014 1457   BASOSABS 0.0 04/24/2019 0904   BASOSABS 0.1 12/03/2017 1447   BASOSABS 0.1 05/14/2014 1457   Comprehensive Metabolic Panel:    Component Value Date/Time   NA 137 04/24/2019 0904   NA 137 12/03/2017 1447   NA 137 05/14/2014 1457   K 3.8 04/24/2019 0904   K 3.2 (L) 05/14/2014 1457   CL 102 04/24/2019 0904   CL 103 05/14/2014 1457   CO2 27 04/24/2019 0904   CO2 28 05/14/2014 1457   BUN 11 04/24/2019 0904   BUN 8 12/03/2017 1447   BUN 8 05/14/2014 1457   CREATININE 0.61 04/24/2019 0904   CREATININE 0.79 05/14/2014 1457   GLUCOSE 117 (H) 04/24/2019 0904   GLUCOSE 80 05/14/2014 1457   CALCIUM 8.6 (L) 04/24/2019 0904   CALCIUM 8.9 05/14/2014 1457   AST 16 04/17/2019 0811   AST 9 (L) 05/14/2014 1457   ALT 11 04/17/2019 0811   ALT 15 05/14/2014 1457   ALKPHOS 90 04/17/2019 0811   ALKPHOS 87 05/14/2014 1457   BILITOT 0.4 04/17/2019 0811   BILITOT 0.6 12/03/2017 1447   BILITOT 0.2 05/14/2014 1457   PROT 7.1 04/17/2019 0811   PROT 6.8 12/03/2017 1447   PROT 7.0 05/14/2014 1457   ALBUMIN 3.5 04/17/2019 0811    ALBUMIN 3.8 12/03/2017 1447   ALBUMIN 3.3 (L) 05/14/2014 1457    RADIOGRAPHIC STUDIES: Dg Chest Port 1 View  Result Date: 04/05/2019 CLINICAL DATA:  Postoperative study. EXAM: PORTABLE CHEST 1 VIEW COMPARISON:  November 12, 2018 FINDINGS: The left Port-A-Cath terminates in the central SVC. Postoperative changes in the right base. No pneumothorax. The cardiomediastinal silhouette is normal. IMPRESSION: The left Port-A-Cath terminates in the central SVC. No pneumothorax. No other acute abnormalities. Electronically Signed   By: Dorise Bullion III M.D   On: 04/05/2019 11:44   Dg C-arm 1-60 Min-no Report  Result Date: 04/05/2019 Fluoroscopy was utilized by the requesting physician.  No radiographic interpretation.    PERFORMANCE STATUS (ECOG) : 1 - Symptomatic but completely ambulatory  Review of Systems Unless otherwise noted, a complete review of systems is negative.  Physical Exam General: NAD, frail appearing, thin Pulmonary: unlabored Extremities: no edema Skin: no rashes Neurological: Weakness but otherwise nonfocal  IMPRESSION: I met with patient while she was receiving infusion.  She had received Benadryl IV as part of her premedication and was quite drowsy.  She was falling asleep during conversation and while arousable, I felt she was not alert enough to be able to participate in our conversations.  We did discuss that her appetite has been reduced and she had significant nausea following her last treatment.  She agreed to return to clinic tomorrow for evaluation and symptom management clinic and possible IV fluids.  We will plan to revisit pleuritic pain, sore throat at that time.  Will address acute nausea and  vomiting in symptom management then plan to have patient follow-up with palliative care for ongoing symptom management and more in-depth conversation regarding goals of care.  Revisit most form treatment is given with curative intent however, only 10 to 20% chance of cure.    PLAN: -Continue current scope of treatment -Agree with Percocet as needed for pain -Trial of Magic mouthwash -Most form reviewed -RTC on 04/25/2019 for Symptom Management Clinic  - follow up with palliative care tbd.   Patient expressed understanding and was in agreement with this plan. She also understands that She can call the clinic at any time with any questions, concerns, or complaints.   Time Total: 15 minutes  Visit consisted of counseling and education dealing with the complex and emotionally intense issues of symptom management and palliative care in the setting of serious and potentially life-threatening illness.Greater than 50%  of this time was spent counseling and coordinating care related to the above assessment and plan.  Signed by: Beckey Rutter, DNP, AGNP-C [facility] [place] (work cell) [phone] (office)  CC: Dr. Rogue Bussing

## 2019-04-24 NOTE — Progress Notes (Signed)
Nutrition Assessment   Reason for Assessment:   Referral from [place], NP   ASSESSMENT:  58 year old female with stage III squamous cell carcinoma of right lower lobe of lung.  S/p thoracotomy and concurrent carbo-taxol chemotherapy with radation.  Past medical history of laryngeal cancer s/p chemo and radiation, COPD, GERD, HLD,   Followed by palliative care.  Spoke with patient during infusion.  Patient asleep and RD tried to arouse.  RN was able to get patient awake, was given benadryl prior to infusion.  Tried to speak with patient but kept falling asleep.  Patient reported that her appetite has been decreased.  Unable to determine time frame.  Reports was nauseated after last treatment. Unable to gather more information.   Nutrition Focused Physical Exam: deferred   Medications: magic mouthwash, zofran, carafate,    Labs: reviewed   Anthropometrics:   Height: 65 inches Weight: 200 lb today UBW: 195-200 lb per chart BMI: 32   Estimated Energy Needs  Kcals: 1900-2200 Protein: 95-110 g Fluid: 2.2 L   NUTRITION DIAGNOSIS: Iunable to determine at this time    INTERVENTION:  Unable to gather much information today during infusion due to patient falling asleep.   Left handout on nausea and vomiting for patient with contact information.   MONITORING, EVALUATION, GOAL: Patient will consume adequate calories and protein to maintain lean body mass during treatment   Next Visit: phone f/u on Thursday, July 9th  Miliani Deike B. Zenia Resides, [place], [place] Registered Dietitian [phone] (pager)

## 2019-04-25 ENCOUNTER — Other Ambulatory Visit
Admission: RE | Admit: 2019-04-25 | Discharge: 2019-04-25 | Disposition: A | Discharge status: Home / Self Care | Payer: Medicare Other | Source: Ambulatory Visit | Attending: Gastroenterology | Admitting: Gastroenterology

## 2019-04-25 ENCOUNTER — Ambulatory Visit
Admission: RE | Admit: 2019-04-25 | Discharge: 2019-04-25 | Disposition: A | Discharge status: Home / Self Care | Payer: Medicare Other | Source: Ambulatory Visit | Attending: Radiation Oncology | Admitting: Radiation Oncology

## 2019-04-25 ENCOUNTER — Other Ambulatory Visit: Payer: Self-pay

## 2019-04-25 ENCOUNTER — Inpatient Hospital Stay (HOSPITAL_BASED_OUTPATIENT_CLINIC_OR_DEPARTMENT_OTHER): Payer: Medicare Other | Admitting: Nurse Practitioner

## 2019-04-25 ENCOUNTER — Inpatient Hospital Stay: Payer: Medicare Other

## 2019-04-25 VITALS — BP 122/76 | HR 66 | Temp 97.2°F | Resp 20

## 2019-04-25 DIAGNOSIS — Z79899 Other long term (current) drug therapy: Secondary | ICD-10-CM | POA: Diagnosis not present

## 2019-04-25 DIAGNOSIS — K521 Toxic gastroenteritis and colitis: Secondary | ICD-10-CM

## 2019-04-25 DIAGNOSIS — Z1159 Encounter for screening for other viral diseases: Secondary | ICD-10-CM | POA: Insufficient documentation

## 2019-04-25 DIAGNOSIS — Z8521 Personal history of malignant neoplasm of larynx: Secondary | ICD-10-CM | POA: Diagnosis not present

## 2019-04-25 DIAGNOSIS — C771 Secondary and unspecified malignant neoplasm of intrathoracic lymph nodes: Secondary | ICD-10-CM | POA: Diagnosis not present

## 2019-04-25 DIAGNOSIS — K219 Gastro-esophageal reflux disease without esophagitis: Secondary | ICD-10-CM | POA: Diagnosis not present

## 2019-04-25 DIAGNOSIS — C3431 Malignant neoplasm of lower lobe, right bronchus or lung: Secondary | ICD-10-CM | POA: Diagnosis not present

## 2019-04-25 DIAGNOSIS — R5383 Other fatigue: Secondary | ICD-10-CM | POA: Diagnosis not present

## 2019-04-25 DIAGNOSIS — E785 Hyperlipidemia, unspecified: Secondary | ICD-10-CM | POA: Diagnosis not present

## 2019-04-25 DIAGNOSIS — T451X5A Adverse effect of antineoplastic and immunosuppressive drugs, initial encounter: Secondary | ICD-10-CM

## 2019-04-25 DIAGNOSIS — R400 Somnolence: Secondary | ICD-10-CM | POA: Diagnosis not present

## 2019-04-25 DIAGNOSIS — R112 Nausea with vomiting, unspecified: Secondary | ICD-10-CM

## 2019-04-25 DIAGNOSIS — R0789 Other chest pain: Secondary | ICD-10-CM | POA: Diagnosis not present

## 2019-04-25 DIAGNOSIS — Z510 Encounter for antineoplastic radiation therapy: Secondary | ICD-10-CM | POA: Diagnosis not present

## 2019-04-25 DIAGNOSIS — Z5111 Encounter for antineoplastic chemotherapy: Secondary | ICD-10-CM | POA: Diagnosis not present

## 2019-04-25 DIAGNOSIS — Z01812 Encounter for preprocedural laboratory examination: Secondary | ICD-10-CM | POA: Insufficient documentation

## 2019-04-25 DIAGNOSIS — Z87891 Personal history of nicotine dependence: Secondary | ICD-10-CM | POA: Diagnosis not present

## 2019-04-25 DIAGNOSIS — Z95828 Presence of other vascular implants and grafts: Secondary | ICD-10-CM

## 2019-04-25 DIAGNOSIS — Z7982 Long term (current) use of aspirin: Secondary | ICD-10-CM | POA: Diagnosis not present

## 2019-04-25 DIAGNOSIS — C329 Malignant neoplasm of larynx, unspecified: Secondary | ICD-10-CM

## 2019-04-25 DIAGNOSIS — J449 Chronic obstructive pulmonary disease, unspecified: Secondary | ICD-10-CM | POA: Diagnosis not present

## 2019-04-25 DIAGNOSIS — Z515 Encounter for palliative care: Secondary | ICD-10-CM | POA: Diagnosis not present

## 2019-04-25 LAB — SARS CORONAVIRUS 2 (TAT 6-24 HRS): SARS Coronavirus 2: NEGATIVE

## 2019-04-25 MED ORDER — HEPARIN SOD (PORK) LOCK FLUSH 100 UNIT/ML IV SOLN
500.0000 [IU] | Freq: Once | INTRAVENOUS | Status: AC
  Administered 2019-04-25: 12:00:00 500 [IU]
  Filled 2019-04-25: qty 5

## 2019-04-25 MED ORDER — PROCHLORPERAZINE EDISYLATE 10 MG/2ML IJ SOLN
10.0000 mg | Freq: Once | INTRAMUSCULAR | Status: AC
  Administered 2019-04-25: 11:00:00 10 mg via INTRAVENOUS
  Filled 2019-04-25: qty 2

## 2019-04-25 MED ORDER — MAGIC MOUTHWASH: 5.0000 mL | Freq: Three times a day (TID) | ORAL | 0 refills | Status: AC | PRN

## 2019-04-25 MED ORDER — SODIUM CHLORIDE 0.9% FLUSH
10.0000 mL | Freq: Once | INTRAVENOUS | Status: DC
  Filled 2019-04-25: qty 10

## 2019-04-25 MED ORDER — SODIUM CHLORIDE 0.9 % IV SOLN
Freq: Once | INTRAVENOUS | Status: AC
  Administered 2019-04-25: 10:00:00 via INTRAVENOUS
  Filled 2019-04-25: qty 250

## 2019-04-25 MED ORDER — DEXAMETHASONE SODIUM PHOSPHATE 10 MG/ML IJ SOLN
10.0000 mg | Freq: Once | INTRAMUSCULAR | Status: AC
  Administered 2019-04-25: 11:00:00 10 mg via INTRAVENOUS
  Filled 2019-04-25: qty 1

## 2019-04-25 NOTE — Progress Notes (Signed)
Patient here for iv fluids in [facility] clinic. She Reports 5 loose stools this am and intermittent nausea. No vomiting. Denies any mouth sores.  11 am - patient in bathroom vomiting. Orders from [place], NP obtained for decadron 10 mg iv push via port

## 2019-04-25 NOTE — Progress Notes (Signed)
[facility]  [phone] [phone]  Patient Care Team: Valerie Roys, DO as PCP - General (Family Medicine) Anabel Bene, MD as Referring Physician (Neurology) Telford Nab, RN as Registered Nurse   Name of the patient: Susan Houston  [id number]  1961/10/07   Date of visit: 04/25/19  Diagnosis- Recurrent squamous cell carcinoma  Chief complaint/ Reason for visit- Nausea, Vomiting, Diarrhea  Heme/Onc history:  Oncology History Overview Note  # 2011-laryngeal cancer/SCC; Stage II; April-June 2011 s/p  CRT [Dr.Choski/Bennett]  # 2019- likely STAGE I 10 mm RLL nodule [incidental]; s/p wedge resection-July 2019-stage I squamous cell lung cancer. NO  Adjuvant therapy.  # May 28th 2020-stage III lung cancer-recurrent  RIGHT HILAR-- POSITIVE FOR MALIGNANT NEOPLASTIC CELLS. - CONSISTENT WITH METASTATIC SQUAMOUS CELL CARCINOMA.  #June 2020- Carbo-taxol- with RT  # COPD  # post thoracotomy syndrome  DIAGNOSIS: RLL lung ca  STAGE:  III ; GOALS: cure  CURRENT/MOST RECENT THERAPY: Carbo-taxol- RT planned   Laryngeal cancer ([place])  Primary cancer of right lower lobe of lung ([place])  05/27/2018 Initial Diagnosis   Primary cancer of right lower lobe of lung ([place])   04/17/2019 -  Chemotherapy   The patient had palonosetron (ALOXI) injection 0.25 mg, 0.25 mg, Intravenous,  Once, 2 of 8 cycles Administration: 0.25 mg (04/17/2019), 0.25 mg (04/24/2019) CARBOplatin (PARAPLATIN) 270 mg in sodium chloride 0.9 % 250 mL chemo infusion, 270 mg (100 % of original dose 265.8 mg), Intravenous,  Once, 2 of 8 cycles Dose modification:   (original dose 265.8 mg, Cycle 1) Administration: 270 mg (04/17/2019), 270 mg (04/24/2019) PACLitaxel (TAXOL) 90 mg in sodium chloride 0.9 % 250 mL chemo infusion (</= 80mg /m2), 45 mg/m2 = 90 mg, Intravenous,  Once, 2 of 8 cycles Administration: 90 mg (04/17/2019), 90 mg (04/24/2019)  for chemotherapy  treatment.      Interval history- Susan Houston, 58 year old female with above history of lung cancer, currently on carbo-taxol with radiation, [facility] for nausea, vomiting, and diarrhea. Symptoms started this morning and have persisted since that time. She had similar symptoms after her last chemotherapy. She has taken zofran and imodium (2 tablets) around 4 am this morning. She estimates 5 loose to watery stools this morning. She has been persistently nauseous. No vomiting but she did with previous chemo. No abdominal pain or weakness. She says she feels tired. She was somnolent yesterday with chemotherapy and questions need for benadryl. Appetite reduced but weight overall stable. No fever or chills. No abdominal pain. No chest pain. Denies urinary complaints. Denies weakness or dizziness.   ECOG FS:1 - Symptomatic but completely ambulatory  Review of systems- Review of Systems  Constitutional: Negative for chills, fever, malaise/fatigue and weight loss.  HENT: Negative for hearing loss, nosebleeds, sore throat and tinnitus.   Eyes: Negative for blurred vision and double vision.  Respiratory: Negative for cough, hemoptysis, shortness of breath and wheezing.   Cardiovascular: Negative for chest pain, palpitations and leg swelling.  Gastrointestinal: Positive for diarrhea, nausea and vomiting. Negative for abdominal pain, blood in stool, constipation and melena.  Genitourinary: Negative for dysuria and urgency.  Musculoskeletal: Negative for back pain, falls, joint pain and myalgias.  Skin: Negative for itching and rash.  Neurological: Negative for dizziness, tingling, sensory change, loss of consciousness, weakness and headaches.  Endo/Heme/Allergies: Negative for environmental allergies. Does not bruise/bleed easily.  Psychiatric/Behavioral: Negative for depression (hx of depression- currently controlled- sees psychiatry).  The patient is not nervous/anxious and does  not have insomnia (hx of insomnia- currently well controlled).      Current treatment- Carbo-taxol + Radiation  Allergies  Allergen Reactions  . Morphine And Related Itching    Can tolerate with benadryl  . Cymbalta [Duloxetine Hcl] Other (See Comments)    Pt states it keeps her awake  . Adhesive [Tape] Other (See Comments)    Plastic tape rips skin and bruises her. PLEASE USE PAPER TAPE  . Lexapro [Escitalopram Oxalate] Other (See Comments)    Keeps awake for days.     Past Medical History:  Diagnosis Date  . Anemia    vitamin b12 deficiency. no longer taking supplements  . Anxiety   . Arthritis   . Asthma   . Back pain   . Bruises easily   . Chronic leg pain    BILATERAL  . COPD (chronic obstructive pulmonary disease) (H[facility])    no inhalers for over 1 year  . Depression   . Difficulty sleeping   . Diverticulitis   . Dyspnea    due to having part of lung removed in 2019  . Dysrhythmia 04/2018   brady episodes. cleared by dr. Clayborn Bigness  . Facet syndrome, lumbar 03/28/2015  . GERD (gastroesophageal reflux disease)   . Hemorrhoids   . History of kidney stones 04/2018   recently passed stone  . Hyperlipidemia   . Incontinence of urine   . Laryngeal cancer ([place]) 2009   LARYNX CANCER - CHEMO / RADIATION (NO SURGERY)   . Migraine   . Migraine without aura and without status migrainosus, not intractable 09/11/2016  . Mild cognitive impairment 11/23/2017  . MRSA infection greater than 3 months ago    2008 right side of face  . Nerve damage    right leg. thinks this is from her TKR  . Nodule of lower lobe of right lung 04/22/2018  . Oxygen deficiency    2L/HS  . Primary cancer of right lower lobe of lung ([place]) 04/18/2018   Surgical resection of RLL  . Wears dentures    full upper and lower    Past Surgical History:  Procedure Laterality Date  . ABDOMINAL HYSTERECTOMY  1983  . BLADDER SUSPENSION  2014  . CATARACT EXTRACTION W/PHACO Left 05/11/2018   Procedure: CATARACT  EXTRACTION PHACO AND INTRAOCULAR LENS PLACEMENT ([place]) LEFT;  Surgeon: Leandrew Koyanagi, MD;  Location: [facility];  Service: Ophthalmology;  Laterality: Left;  PREFERS EARLY  . CATARACT EXTRACTION W/PHACO Right 08/11/2018   Procedure: CATARACT EXTRACTION PHACO AND INTRAOCULAR LENS PLACEMENT ([facility]);  Surgeon: Leandrew Koyanagi, MD;  Location: [facility];  Service: Ophthalmology;  Laterality: Right;  CDE 5:02 Total Time 01:35 Fluid lot # [id number]  . CHOLECYSTECTOMY  1998  . COLONOSCOPY    . ENDOBRONCHIAL ULTRASOUND N/A 03/20/2019   Procedure: ENDOBRONCHIAL ULTRASOUND;  Surgeon: Flora Lipps, MD;  Location: [facility];  Service: Cardiopulmonary;  Laterality: N/A;  . ESOPHAGOGASTRODUODENOSCOPY  2015  . ESOPHAGOGASTRODUODENOSCOPY (EGD) WITH PROPOFOL N/A 11/20/2016   Procedure: ESOPHAGOGASTRODUODENOSCOPY (EGD) WITH PROPOFOL;  Surgeon: Jonathon Bellows, MD;  Location: [facility];  Service: Endoscopy;  Laterality: N/A;  . EXCISION NEUROMA Bilateral 09/24/2014   Procedure: BILATERAL OPEN SAPHENOUS NEURECTOMIES AND OPEN LEFT PERIPATELLA OSETOPHYTECTOMY;  Surgeon: Mauri Pole, MD;  Location: [facility];  Service: Orthopedics;  Laterality: Bilateral;  . EYE SURGERY Left 05/11/2018   cataract extraction  . JOINT REPLACEMENT  2009/2010   BIL TOTAL KNEES  . JOINT REPLACEMENT  2020   left  . PORTACATH PLACEMENT N/A 04/05/2019   Procedure: INSERTION PORT-A-CATH;  Surgeon: Nestor Lewandowsky, MD;  Location: [facility];  Service: General;  Laterality: N/A;  . THORACOTOMY/LOBECTOMY Right 05/16/2018   Procedure: THORACOTOMY/POSSIBLE LOBECTOMY;  Surgeon: Nestor Lewandowsky, MD;  Location: [facility];  Service: Thoracic;  Laterality: Right;  . TONSILLECTOMY    . TOTAL KNEE REVISION Left 11/29/2018   Procedure: Left TOTAL KNEE REVISION;  Surgeon: Hessie Knows, MD;  Location: [facility];  Service: Orthopedics;  Laterality: Left;  Marland Kitchen VIDEO BRONCHOSCOPY N/A 05/16/2018   Procedure: PREOP  BRONCHOSCOPY;  Surgeon: Nestor Lewandowsky, MD;   Location: [facility];  Service: Thoracic;  Laterality: N/A;    Social History   Socioeconomic History  . Marital status: Married    Spouse name: Tharon Aquas  . Number of children: 2  . Years of education: 81  . Highest education level: 12th grade  Occupational History  . Occupation: Disabled  Social Needs  . Financial resource strain: Not hard at all  . Food insecurity    Worry: Never true    Inability: Never true  . Transportation needs    Medical: No    Non-medical: No  Tobacco Use  . Smoking status: Former Smoker    Packs/day: 0.50    Types: Cigarettes    Quit date: 04/07/2016    Years since quitting: 3.0  . Smokeless tobacco: Never Used  . Tobacco comment: patient has not smoked x 10 days.06/28/17 chantix  Substance and Sexual Activity  . Alcohol use: No    Alcohol/week: 0.0 standard drinks  . Drug use: No  . Sexual activity: Yes    Partners: Male    Birth control/protection: None  Lifestyle  . Physical activity    Days per week: 0 days    Minutes per session: 0 min  . Stress: Not at all  Relationships  . Social Herbalist on phone: Never    Gets together: Twice a week    Attends religious service: More than 4 times per year    Active member of club or organization: No    Attends meetings of clubs or organizations: Never    Relationship status: Married  . Intimate partner violence    Fear of current or ex partner: No    Emotionally abused: No    Physically abused: No    Forced sexual activity: No  Other Topics Concern  . Not on file  Social History Narrative   Lives at home w/ her husband   Right-handed   Caffeine: occasionally    Family History  Problem Relation Age of Onset  . Asthma Mother   . Diabetes Mother   . Hyperlipidemia Mother   . Hypertension Mother   . Cirrhosis Mother        Non-alcoholic  . Arthritis Father   . Cancer Father        Bone  . Hyperlipidemia Father   . Hypertension Father   . Anxiety disorder Sister   .  Depression Sister   . Kidney Stones Son   . Stroke Maternal Grandmother   . Breast cancer Maternal Grandmother 84  . Cancer Maternal Grandfather        lung  . Pneumonia Paternal Grandmother   . Alzheimer's disease Paternal Grandfather   . Diabetes Sister   . Gout Sister   . Hypertension Sister   . Alcohol abuse Brother   . Heart disease Brother  massive MI     Current Outpatient Medications:  .  albuterol (PROVENTIL HFA;VENTOLIN HFA) 108 (90 Base) MCG/ACT inhaler, Inhale 2 puffs into the lungs every 4 (four) hours as needed for wheezing or shortness of breath., Disp: 1 Inhaler, Rfl: 2 .  albuterol (PROVENTIL) (2.5 MG/3ML) 0.083% nebulizer solution, Take 3 mLs (2.5 mg total) by nebulization every 4 (four) hours as needed for wheezing or shortness of breath., Disp: 75 mL, Rfl: 12 .  aspirin EC 81 MG tablet, Take 81 mg by mouth daily., Disp: , Rfl:  .  aspirin-acetaminophen-caffeine (EXCEDRIN MIGRAINE) 250-250-65 MG tablet, Take 1 tablet by mouth daily as needed for headache., Disp: , Rfl:  .  clonazePAM (KLONOPIN) 0.5 MG tablet, Take 0.5-1 tablets (0.25-0.5 mg total) by mouth daily as needed for anxiety. Try to limit use and use only for severe anxiety and sleep problems, Disp: 25 tablet, Rfl: 1 .  estradiol (ESTRACE) 1 MG tablet, Take 1 mg by mouth daily. , Disp: , Rfl:  .  FLUoxetine (PROZAC) 20 MG capsule, Take 1 capsule (20 mg total) by mouth daily. To be combined with 40 mg, Disp: 90 capsule, Rfl: 1 .  FLUoxetine (PROZAC) 40 MG capsule, Take 1 capsule (40 mg total) by mouth daily. To be combined with 20 mg, Disp: 90 capsule, Rfl: 1 .  guaiFENesin-codeine 100-10 MG/5ML syrup, Take 10 mLs by mouth 3 (three) times daily as needed for cough., Disp: 120 mL, Rfl: 0 .  hydrOXYzine (VISTARIL) 25 MG capsule, TAKE 1 CAPSULE(25 MG) BY MOUTH TWICE DAILY AS NEEDED FOR SEVERE ANXIETY OR SYMPTOMS, Disp: 60 capsule, Rfl: 1 .  lidocaine-prilocaine (EMLA) cream, Apply to affected area once, Disp:  30 g, Rfl: 3 .  magic mouthwash SOLN, Take 5 mLs by mouth 3 (three) times daily as needed for mouth pain., Disp: 240 mL, Rfl: 0 .  nystatin cream (MYCOSTATIN), Apply 1 application topically 2 (two) times daily. (Patient taking differently: Apply 1 application topically 2 (two) times daily as needed (rash). ), Disp: 30 g, Rfl: 0 .  omeprazole (PRILOSEC) 40 MG capsule, Take 1 capsule (40 mg total) by mouth daily. (Patient taking differently: Take 40 mg by mouth every morning. ), Disp: 90 capsule, Rfl: 1 .  ondansetron (ZOFRAN) 8 MG tablet, Take 1 tablet (8 mg total) by mouth 2 (two) times daily as needed for refractory nausea / vomiting. Start on day 3 after chemo., Disp: 30 tablet, Rfl: 1 .  oxyCODONE-acetaminophen (PERCOCET) 5-325 MG tablet, Take 1 tablet by mouth every 8 (eight) hours as needed for severe pain., Disp: 45 tablet, Rfl: 0 .  OXYGEN, Inhale 2 L into the lungs at bedtime., Disp: , Rfl:  .  predniSONE (DELTASONE) 20 MG tablet, Take 1 tablet (20 mg total) by mouth daily., Disp: 5 tablet, Rfl: 0 .  pregabalin (LYRICA) 50 MG capsule, Take 1 capsule (50 mg total) by mouth 3 (three) times daily., Disp: 90 capsule, Rfl: 2 .  prochlorperazine (COMPAZINE) 10 MG tablet, Take 1 tablet (10 mg total) by mouth every 6 (six) hours as needed (Nausea or vomiting)., Disp: 30 tablet, Rfl: 1 .  sucralfate (CARAFATE) 1 g tablet, Take 1 tablet (1 g total) by mouth 3 (three) times daily. Dissolve in 3-4 tbsp warm water, swish and swallow., Disp: 90 tablet, Rfl: 3 .  Suvorexant (BELSOMRA) 20 MG TABS, Take 20 mg by mouth at bedtime., Disp: 30 tablet, Rfl: 2 .  traMADol (ULTRAM) 50 MG tablet, Take 1 tablet (50 mg total)  by mouth every 8 (eight) hours as needed., Disp: 90 tablet, Rfl: 0 No current facility-administered medications for this visit.   Facility-Administered Medications Ordered in Other Visits:  .  0.9 %  sodium chloride infusion, , Intravenous, Once, Verlon Au, NP .  heparin lock flush 100  unit/mL, 500 Units, Intracatheter, Once, Verlon Au, NP .  ipratropium-albuterol (DUONEB) 0.5-2.5 (3) MG/3ML nebulizer solution 3 mL, 3 mL, Nebulization, Q6H, Burns, Jennifer E, NP .  sodium chloride flush (NS) 0.9 % injection 10 mL, 10 mL, Intravenous, Once, Verlon Au, NP  Physical exam:  Vitals:   04/25/19 0945 04/25/19 1610  BP: 122/76 122/76  Pulse: 66 66  Resp: 20   Temp: (!) 97.2 F (36.2 C) (!) 97.2 F (36.2 C)  TempSrc: Tympanic    Physical Exam Constitutional:      Appearance: She is well-developed.     Comments: Fatigued appearing, in recliner in exam room  HENT:     Head: Atraumatic.     Nose: Nose normal.     Mouth/Throat:     Pharynx: No oropharyngeal exudate.  Eyes:     General: No scleral icterus.    Conjunctiva/sclera: Conjunctivae normal.     Pupils: Pupils are equal, round, and reactive to light.  Neck:     Musculoskeletal: Normal range of motion and neck supple.  Cardiovascular:     Rate and Rhythm: Normal rate and regular rhythm.  Pulmonary:     Effort: Pulmonary effort is normal.     Breath sounds: Normal breath sounds.  Abdominal:     General: Bowel sounds are normal. There is no distension.     Palpations: Abdomen is soft.     Tenderness: There is no abdominal tenderness. There is no guarding.  Musculoskeletal: Normal range of motion.  Skin:    General: Skin is warm and dry.  Neurological:     Mental Status: She is alert and oriented to person, place, and time.  Psychiatric:        Mood and Affect: Affect is flat.        Behavior: Behavior normal.      CMP Latest Ref Rng & Units 04/24/2019  Glucose 70 - 99 mg/dL 117(H)  BUN 6 - 20 mg/dL 11  Creatinine 0.44 - 1.00 mg/dL 0.61  Sodium 135 - 145 mmol/L 137  Potassium 3.5 - 5.1 mmol/L 3.8  Chloride 98 - 111 mmol/L 102  CO2 22 - 32 mmol/L 27  Calcium 8.9 - 10.3 mg/dL 8.6(L)  Total Protein 6.5 - 8.1 g/dL -  Total Bilirubin 0.3 - 1.2 mg/dL -  Alkaline Phos 38 - 126 U/L -  AST 15 -  41 U/L -  ALT 0 - 44 U/L -   CBC Latest Ref Rng & Units 04/24/2019  WBC 4.0 - 10.5 K/uL 4.5  Hemoglobin 12.0 - 15.0 g/dL 9.7(L)  Hematocrit 36.0 - 46.0 % 31.2(L)  Platelets 150 - 400 K/uL 157    No images are attached to the encounter.  Dg Chest Port 1 View  Result Date: 04/05/2019 CLINICAL DATA:  Postoperative study. EXAM: PORTABLE CHEST 1 VIEW COMPARISON:  November 12, 2018 FINDINGS: The left Port-A-Cath terminates in the central SVC. Postoperative changes in the right base. No pneumothorax. The cardiomediastinal silhouette is normal. IMPRESSION: The left Port-A-Cath terminates in the central SVC. No pneumothorax. No other acute abnormalities. Electronically Signed   By: Dorise Bullion III M.D   On: 04/05/2019 11:44   Dg C-arm  1-60 Min-no Report  Result Date: 04/05/2019 Fluoroscopy was utilized by the requesting physician.  No radiographic interpretation.    Assessment and plan- Patient is a 58 y.o. female diagnosed with recurrent squamous cell carcinoma currently on carbo-taxol with radiation who [facility] for nausea, vomiting, and diarrhea.   Recurrent Squamous Cell Carcinoma- s/p biopsy of right hilar lymph node, stage III. Currently s/p cycle 2 of carbo-taxol weekly chemotherapy along with radiation. She had increased somnolence with benadryl as pre-med. Discussed with Dr. Rogue Bussing who will decrease to 25mg  with next treatment.   Chemotherapy Induced Nausea & Vomiting- one episode of emesis in clinic. Received IV fluids along with compazine 10 mg IV. Persistent nausea and one episode of emesis in clinic. Received decadron 10mg  IV with improvement in symptoms. Encouraged continued dosing of zofran and compazine as prescribed. If persistent symptoms would consider short course of decadron. Discussed zyprexa vs phenergan vs ativan with Dr. Shea Evans, patient's psychiatrist given possible interactions and current psychiatric medications. She recommended low doses  to assess for response and possible somnolence. If zyprexa, recommend baseline ekg prior to starting.   Chemotherapy Induced Diarrhea- continue imodium, dosing reinforced, 2 tablets at first loose stool then additional 1 tablet with each subsequent stool, not to exceed 16mg  in 24 hour period. If persistent symptoms consider lomotil. Nutrition discussed.   She has colonoscopy scheduled for 04/28/2019. Will have her rtc on 04/27/2019 to re-evaluate her symptoms and check labs at that time and possible iv infusion.    Visit Diagnosis 1. Chemotherapy induced nausea and vomiting   2. Chemotherapy induced diarrhea     Patient expressed understanding and was in agreement with this plan. She also understands that She can call clinic at any time with any questions, concerns, or complaints.   Thank you for allowing me to participate in the care of this very pleasant patient.   Beckey Rutter, DNP, AGNP-C [facility] (work cell) [phone] (office)  CC: Dr. Rogue Bussing

## 2019-04-26 ENCOUNTER — Other Ambulatory Visit: Payer: Self-pay

## 2019-04-26 ENCOUNTER — Ambulatory Visit
Admission: RE | Admit: 2019-04-26 | Discharge: 2019-04-26 | Disposition: A | Discharge status: Home / Self Care | Payer: Medicare Other | Source: Ambulatory Visit | Attending: Radiation Oncology | Admitting: Radiation Oncology

## 2019-04-26 ENCOUNTER — Other Ambulatory Visit: Payer: Self-pay | Admitting: *Deleted

## 2019-04-26 DIAGNOSIS — C771 Secondary and unspecified malignant neoplasm of intrathoracic lymph nodes: Secondary | ICD-10-CM | POA: Diagnosis not present

## 2019-04-26 DIAGNOSIS — Z87891 Personal history of nicotine dependence: Secondary | ICD-10-CM | POA: Diagnosis not present

## 2019-04-26 DIAGNOSIS — C3431 Malignant neoplasm of lower lobe, right bronchus or lung: Secondary | ICD-10-CM

## 2019-04-26 DIAGNOSIS — Z510 Encounter for antineoplastic radiation therapy: Secondary | ICD-10-CM | POA: Diagnosis not present

## 2019-04-27 ENCOUNTER — Inpatient Hospital Stay: Payer: Medicare Other

## 2019-04-27 ENCOUNTER — Inpatient Hospital Stay (HOSPITAL_BASED_OUTPATIENT_CLINIC_OR_DEPARTMENT_OTHER): Payer: Medicare Other | Admitting: Oncology

## 2019-04-27 ENCOUNTER — Other Ambulatory Visit: Payer: Self-pay

## 2019-04-27 ENCOUNTER — Encounter: Payer: Self-pay | Admitting: Oncology

## 2019-04-27 ENCOUNTER — Ambulatory Visit
Admission: RE | Admit: 2019-04-27 | Discharge: 2019-04-27 | Disposition: A | Discharge status: Home / Self Care | Payer: Medicare Other | Source: Ambulatory Visit | Attending: Radiation Oncology | Admitting: Radiation Oncology

## 2019-04-27 VITALS — BP 119/64 | HR 72 | Temp 97.4°F | Resp 18

## 2019-04-27 DIAGNOSIS — R112 Nausea with vomiting, unspecified: Secondary | ICD-10-CM

## 2019-04-27 DIAGNOSIS — R400 Somnolence: Secondary | ICD-10-CM | POA: Diagnosis not present

## 2019-04-27 DIAGNOSIS — K219 Gastro-esophageal reflux disease without esophagitis: Secondary | ICD-10-CM | POA: Diagnosis not present

## 2019-04-27 DIAGNOSIS — Z79899 Other long term (current) drug therapy: Secondary | ICD-10-CM | POA: Diagnosis not present

## 2019-04-27 DIAGNOSIS — C3431 Malignant neoplasm of lower lobe, right bronchus or lung: Secondary | ICD-10-CM | POA: Diagnosis not present

## 2019-04-27 DIAGNOSIS — Z510 Encounter for antineoplastic radiation therapy: Secondary | ICD-10-CM | POA: Diagnosis not present

## 2019-04-27 DIAGNOSIS — C771 Secondary and unspecified malignant neoplasm of intrathoracic lymph nodes: Secondary | ICD-10-CM | POA: Diagnosis not present

## 2019-04-27 DIAGNOSIS — J449 Chronic obstructive pulmonary disease, unspecified: Secondary | ICD-10-CM | POA: Diagnosis not present

## 2019-04-27 DIAGNOSIS — K521 Toxic gastroenteritis and colitis: Secondary | ICD-10-CM | POA: Diagnosis not present

## 2019-04-27 DIAGNOSIS — Z8521 Personal history of malignant neoplasm of larynx: Secondary | ICD-10-CM | POA: Diagnosis not present

## 2019-04-27 DIAGNOSIS — E785 Hyperlipidemia, unspecified: Secondary | ICD-10-CM | POA: Diagnosis not present

## 2019-04-27 DIAGNOSIS — Z515 Encounter for palliative care: Secondary | ICD-10-CM | POA: Diagnosis not present

## 2019-04-27 DIAGNOSIS — Z5111 Encounter for antineoplastic chemotherapy: Secondary | ICD-10-CM | POA: Diagnosis not present

## 2019-04-27 DIAGNOSIS — Z7982 Long term (current) use of aspirin: Secondary | ICD-10-CM | POA: Diagnosis not present

## 2019-04-27 DIAGNOSIS — R5383 Other fatigue: Secondary | ICD-10-CM | POA: Diagnosis not present

## 2019-04-27 DIAGNOSIS — T451X5A Adverse effect of antineoplastic and immunosuppressive drugs, initial encounter: Secondary | ICD-10-CM

## 2019-04-27 DIAGNOSIS — Z87891 Personal history of nicotine dependence: Secondary | ICD-10-CM | POA: Diagnosis not present

## 2019-04-27 DIAGNOSIS — R0789 Other chest pain: Secondary | ICD-10-CM | POA: Diagnosis not present

## 2019-04-27 LAB — COMPREHENSIVE METABOLIC PANEL
ALT: 13 U/L (ref 0–44)
AST: 21 U/L (ref 15–41)
Albumin: 3.3 g/dL — ABNORMAL LOW (ref 3.5–5.0)
Alkaline Phosphatase: 62 U/L (ref 38–126)
Anion gap: 8 (ref 5–15)
BUN: 15 mg/dL (ref 6–20)
CO2: 27 mmol/L (ref 22–32)
Calcium: 8.6 mg/dL — ABNORMAL LOW (ref 8.9–10.3)
Chloride: 105 mmol/L (ref 98–111)
Creatinine, Ser: 0.7 mg/dL (ref 0.44–1.00)
GFR calc Af Amer: >60 mL/min (ref >60)
GFR calc non Af Amer: >60 mL/min (ref >60)
Glucose, Bld: 118 mg/dL — ABNORMAL HIGH (ref 70–99)
Potassium: 3.6 mmol/L (ref 3.5–5.1)
Sodium: 140 mmol/L (ref 135–145)
Total Bilirubin: 0.5 mg/dL (ref 0.3–1.2)
Total Protein: 6.4 g/dL — ABNORMAL LOW (ref 6.5–8.1)

## 2019-04-27 LAB — CBC WITH DIFFERENTIAL/PLATELET
Abs Immature Granulocytes: 0.02 10*3/uL (ref 0.00–0.07)
Basophils Absolute: 0 10*3/uL (ref 0.0–0.1)
Basophils Relative: 0 %
Eosinophils Absolute: 0 10*3/uL (ref 0.0–0.5)
Eosinophils Relative: 0 %
HCT: 31.7 % — ABNORMAL LOW (ref 36.0–46.0)
Hemoglobin: 9.9 g/dL — ABNORMAL LOW (ref 12.0–15.0)
Immature Granulocytes: 1 %
Lymphocytes Relative: 20 %
Lymphs Abs: 0.8 10*3/uL (ref 0.7–4.0)
MCH: 25.5 pg — ABNORMAL LOW (ref 26.0–34.0)
MCHC: 31.2 g/dL (ref 30.0–36.0)
MCV: 81.7 fL (ref 80.0–100.0)
Monocytes Absolute: 0.1 10*3/uL (ref 0.1–1.0)
Monocytes Relative: 2 %
Neutro Abs: 3 10*3/uL (ref 1.7–7.7)
Neutrophils Relative %: 77 %
Platelets: 181 10*3/uL (ref 150–400)
RBC: 3.88 MIL/uL (ref 3.87–5.11)
RDW: 14.7 % (ref 11.5–15.5)
WBC: 3.9 10*3/uL — ABNORMAL LOW (ref 4.0–10.5)
nRBC: 0 % (ref 0.0–0.2)

## 2019-04-27 MED ORDER — HEPARIN SOD (PORK) LOCK FLUSH 100 UNIT/ML IV SOLN
500.0000 [IU] | Freq: Once | INTRAVENOUS | Status: AC
  Administered 2019-04-27: 500 [IU] via INTRAVENOUS

## 2019-04-27 NOTE — Progress Notes (Signed)
Nutrition Follow-up:  Patient with stage III squamous cell carcinoma of right lower lobe of lung. Noted reviewed from [facility] visit on 7/7 for nausea, vomiting and diarrhea   Spoke with patient via phone. She reports that she is feeling better.  Denies issues with nausea, vomiting or diarrhea since 7/7.  Reports that she ate cereal yesterday, no lunch just some cantelope and watermelon,  and ate salmon patties, corn, squash and cucumbers last night for dinner without issues.  Reports that appetite has been down this week, otherwise fairly good appetite.      Medications: reviewed  Labs: reviewed  Anthropometrics:   Weight 200 lb 7/6.     NUTRITION DIAGNOSIS: Inadequate oral intake related to cancer related treatment as evidenced by nausea, vomiting, diarrhea   INTERVENTION:  Reviewed briefly foods to eat when nauseated.  Patient did pick up handout left by RD on Monday.   Nothing further discussed as phone disconnected.  RD called patient back and left message on voicemail with call back number.     MONITORING, EVALUATION, GOAL: Patient will consume adequate calories and protein to maintain weight during treatment   NEXT VISIT: to be determined   Mayrene Bastarache B. Zenia Resides, [place], [place] Registered Dietitian [phone] (pager)

## 2019-04-27 NOTE — Progress Notes (Signed)
Symptom Management Consult note [facility]  Telephone:([phone] [phone]  Patient Care Team: Valerie Roys, DO as PCP - General (Family Medicine) Anabel Bene, MD as Referring Physician (Neurology) Telford Nab, RN as Registered Nurse   Name of the patient: Susan Houston  [id number]  1961-03-07   Date of visit: 04/27/2019  Diagnosis: Recurrent squamous cell carcinoma  Chief Complaint: Follow-up from [facility] visit on 04/25/19.  Current Treatment: Carbo/Taxol with concurrent radiation  Oncology History:  Oncology History Overview Note  # 2011-laryngeal cancer/SCC; Stage II; April-June 2011 s/p  CRT [Dr.Choski/Bennett]  # 2019- likely STAGE I 10 mm RLL nodule [incidental]; s/p wedge resection-July 2019-stage I squamous cell lung cancer. NO  Adjuvant therapy.  # May 28th 2020-stage III lung cancer-recurrent  RIGHT HILAR-- POSITIVE FOR MALIGNANT NEOPLASTIC CELLS. - CONSISTENT WITH METASTATIC SQUAMOUS CELL CARCINOMA.  #June 2020- Carbo-taxol- with RT  # COPD  # post thoracotomy syndrome  DIAGNOSIS: RLL lung ca  STAGE:  III ; GOALS: cure  CURRENT/MOST RECENT THERAPY: Carbo-taxol- RT planned   Laryngeal cancer ([place])  Primary cancer of right lower lobe of lung ([place])  05/27/2018 Initial Diagnosis   Primary cancer of right lower lobe of lung ([place])   04/17/2019 -  Chemotherapy   The patient had palonosetron (ALOXI) injection 0.25 mg, 0.25 mg, Intravenous,  Once, 2 of 8 cycles Administration: 0.25 mg (04/17/2019), 0.25 mg (04/24/2019) CARBOplatin (PARAPLATIN) 270 mg in sodium chloride 0.9 % 250 mL chemo infusion, 270 mg (100 % of original dose 265.8 mg), Intravenous,  Once, 2 of 8 cycles Dose modification:   (original dose 265.8 mg, Cycle 1) Administration: 270 mg (04/17/2019), 270 mg (04/24/2019) PACLitaxel (TAXOL) 90 mg in sodium chloride 0.9 % 250 mL chemo infusion (</= 80mg /m2), 45 mg/m2 = 90 mg, Intravenous,  Once, 2 of 8 cycles Administration:  90 mg (04/17/2019), 90 mg (04/24/2019)  for chemotherapy treatment.      Subjective Data: [name], 58 year old female with above history of lung cancer, currently on carbotaxol with radiation presents to [facility] for follow-up after being evaluated on 7 7 for persistent nausea, vomiting and diarrhea.  She received IV fluids along with IV Compazine.  She continued to have vomiting while in clinic so she was given 10 mg IV Decadron with improvement of symptoms.  She was encouraged to continue Zofran and Compazine as needed for nausea and vomiting and Imodium for diarrhea.  She was scheduled for reevaluation today to monitor symptoms and labs prior to her colonoscopy scheduled for tomorrow 04/28/2019.   Today, patient feels much better.  She denies any additional uncontrolled nausea, vomiting or diarrhea.  She states she feels back to her old self.  Appetite continues to be fair.  She is drinking approximately 8-10 bottles of water daily.  She eats 1-2 meals daily.  She denies any use of supplementation such as Ensure or Breeze.  She denies any shortness of breath or chest pain.  She denies any infection or fever.  She denies any abdominal pain, constipation or diarrhea.  She denies any urinary complaints.  ECOG: 1 - Symptomatic but completely ambulatory   Review of Systems  Constitutional: Negative.  Negative for chills, fever, malaise/fatigue and weight loss.       Fair appetite  HENT: Negative for congestion, ear pain and tinnitus.   Eyes: Negative.  Negative for blurred vision and double vision.  Respiratory: Negative.  Negative for cough, sputum production and shortness of breath.   Cardiovascular:  Negative.  Negative for chest pain, palpitations and leg swelling.  Gastrointestinal: Negative.  Negative for abdominal pain, constipation, diarrhea, nausea and vomiting.  Genitourinary: Negative for dysuria, frequency and urgency.  Musculoskeletal: Negative for back pain and falls.  Skin: Negative.   Negative for rash.  Neurological: Negative.  Negative for weakness and headaches.  Endo/Heme/Allergies: Negative.  Does not bruise/bleed easily.  Psychiatric/Behavioral: Negative.  Negative for depression. The patient is not nervous/anxious and does not have insomnia.    Allergies  Allergen Reactions  . Morphine And Related Itching    Can tolerate with benadryl  . Cymbalta [Duloxetine Hcl] Other (See Comments)    Pt states it keeps her awake  . Adhesive [Tape] Other (See Comments)    Plastic tape rips skin and bruises her. PLEASE USE PAPER TAPE  . Lexapro [Escitalopram Oxalate] Other (See Comments)    Keeps awake for days.    Past Medical History:  Diagnosis Date  . Anemia    vitamin b12 deficiency. no longer taking supplements  . Anxiety   . Arthritis   . Asthma   . Back pain   . Bruises easily   . Chronic leg pain    BILATERAL  . COPD (chronic obstructive pulmonary disease) (HCC)    no inhalers for over 1 year  . Depression   . Difficulty sleeping   . Diverticulitis   . Dyspnea    due to having part of lung removed in 2019  . Dysrhythmia 04/2018   brady episodes. cleared by dr. [place] Bigness  . Facet syndrome, lumbar 03/28/2015  . GERD (gastroesophageal reflux disease)   . Hemorrhoids   . History of kidney stones 04/2018   recently passed stone  . Hyperlipidemia   . Incontinence of urine   . Laryngeal cancer ([place]) 2009   LARYNX CANCER - CHEMO / RADIATION (NO SURGERY)   . Migraine   . Migraine without aura and without status migrainosus, not intractable 09/11/2016  . Mild cognitive impairment 11/23/2017  . MRSA infection greater than 3 months ago    2008 right side of face  . Nerve damage    right leg. thinks this is from her TKR  . Nodule of lower lobe of right lung 04/22/2018  . Oxygen deficiency    2L/HS  . Primary cancer of right lower lobe of lung ([place]) 04/18/2018   Surgical resection of RLL  . Wears dentures    full upper and lower   Past Surgical History:   Procedure Laterality Date  . ABDOMINAL HYSTERECTOMY  1983  . BLADDER SUSPENSION  2014  . CATARACT EXTRACTION W/PHACO Left 05/11/2018   Procedure: CATARACT EXTRACTION PHACO AND INTRAOCULAR LENS PLACEMENT ([place]) LEFT;  Surgeon: Leandrew Koyanagi, MD;  Location: [facility];  Service: Ophthalmology;  Laterality: Left;  PREFERS EARLY  . CATARACT EXTRACTION W/PHACO Right 08/11/2018   Procedure: CATARACT EXTRACTION PHACO AND INTRAOCULAR LENS PLACEMENT ([facility]);  Surgeon: Leandrew Koyanagi, MD;  Location: [facility];  Service: Ophthalmology;  Laterality: Right;  CDE 5:02 Total Time 01:35 Fluid lot # [id number]  . CHOLECYSTECTOMY  1998  . COLONOSCOPY    . ENDOBRONCHIAL ULTRASOUND N/A 03/20/2019   Procedure: ENDOBRONCHIAL ULTRASOUND;  Surgeon: Flora Lipps, MD;  Location: [facility];  Service: Cardiopulmonary;  Laterality: N/A;  . ESOPHAGOGASTRODUODENOSCOPY  2015  . ESOPHAGOGASTRODUODENOSCOPY (EGD) WITH PROPOFOL N/A 11/20/2016   Procedure: ESOPHAGOGASTRODUODENOSCOPY (EGD) WITH PROPOFOL;  Surgeon: Jonathon Bellows, MD;  Location: [facility];  Service: Endoscopy;  Laterality: N/A;  . EXCISION NEUROMA Bilateral  09/24/2014   Procedure: BILATERAL OPEN SAPHENOUS NEURECTOMIES AND OPEN LEFT PERIPATELLA OSETOPHYTECTOMY;  Surgeon: Mauri Pole, MD;  Location: [facility];  Service: Orthopedics;  Laterality: Bilateral;  . EYE SURGERY Left 05/11/2018   cataract extraction  . JOINT REPLACEMENT  2009/2010   BIL TOTAL KNEES  . JOINT REPLACEMENT  2020   left  . PORTACATH PLACEMENT N/A 04/05/2019   Procedure: INSERTION PORT-A-CATH;  Surgeon: Nestor Lewandowsky, MD;  Location: [facility];  Service: General;  Laterality: N/A;  . THORACOTOMY/LOBECTOMY Right 05/16/2018   Procedure: THORACOTOMY/POSSIBLE LOBECTOMY;  Surgeon: Nestor Lewandowsky, MD;  Location: [facility];  Service: Thoracic;  Laterality: Right;  . TONSILLECTOMY    . TOTAL KNEE REVISION Left 11/29/2018   Procedure: Left TOTAL KNEE REVISION;  Surgeon: Hessie Knows, MD;   Location: [facility];  Service: Orthopedics;  Laterality: Left;  Marland Kitchen VIDEO BRONCHOSCOPY N/A 05/16/2018   Procedure: PREOP  BRONCHOSCOPY;  Surgeon: Nestor Lewandowsky, MD;  Location: [facility];  Service: Thoracic;  Laterality: N/A;   Social History   Socioeconomic History  . Marital status: Married    Spouse name: Tharon Aquas  . Number of children: 2  . Years of education: 61  . Highest education level: 12th grade  Occupational History  . Occupation: Disabled  Social Needs  . Financial resource strain: Not hard at all  . Food insecurity    Worry: Never true    Inability: Never true  . Transportation needs    Medical: No    Non-medical: No  Tobacco Use  . Smoking status: Former Smoker    Packs/day: 0.50    Types: Cigarettes    Quit date: 04/07/2016    Years since quitting: 3.0  . Smokeless tobacco: Never Used  . Tobacco comment: patient has not smoked x 10 days.06/28/17 chantix  Substance and Sexual Activity  . Alcohol use: No    Alcohol/week: 0.0 standard drinks  . Drug use: No  . Sexual activity: Yes    Partners: Male    Birth control/protection: None  Lifestyle  . Physical activity    Days per week: 0 days    Minutes per session: 0 min  . Stress: Not at all  Relationships  . Social Herbalist on phone: Never    Gets together: Twice a week    Attends religious service: More than 4 times per year    Active member of club or organization: No    Attends meetings of clubs or organizations: Never    Relationship status: Married  . Intimate partner violence    Fear of current or ex partner: No    Emotionally abused: No    Physically abused: No    Forced sexual activity: No  Other Topics Concern  . Not on file  Social History Narrative   Lives at home w/ her husband   Right-handed   Caffeine: occasionally   Current Outpatient Medications on File Prior to Visit  Medication Sig Dispense Refill  . albuterol (PROVENTIL HFA;VENTOLIN HFA) 108 (90 Base) MCG/ACT inhaler  Inhale 2 puffs into the lungs every 4 (four) hours as needed for wheezing or shortness of breath. 1 Inhaler 2  . albuterol (PROVENTIL) (2.5 MG/3ML) 0.083% nebulizer solution Take 3 mLs (2.5 mg total) by nebulization every 4 (four) hours as needed for wheezing or shortness of breath. 75 mL 12  . aspirin EC 81 MG tablet Take 81 mg by mouth daily.    Marland Kitchen aspirin-acetaminophen-caffeine (EXCEDRIN MIGRAINE) 250-250-65 MG tablet Take  1 tablet by mouth daily as needed for headache.    . clonazePAM (KLONOPIN) 0.5 MG tablet Take 0.5-1 tablets (0.25-0.5 mg total) by mouth daily as needed for anxiety. Try to limit use and use only for severe anxiety and sleep problems 25 tablet 1  . estradiol (ESTRACE) 1 MG tablet Take 1 mg by mouth daily.     Marland Kitchen FLUoxetine (PROZAC) 20 MG capsule Take 1 capsule (20 mg total) by mouth daily. To be combined with 40 mg 90 capsule 1  . FLUoxetine (PROZAC) 40 MG capsule Take 1 capsule (40 mg total) by mouth daily. To be combined with 20 mg 90 capsule 1  . guaiFENesin-codeine 100-10 MG/5ML syrup Take 10 mLs by mouth 3 (three) times daily as needed for cough. 120 mL 0  . hydrOXYzine (VISTARIL) 25 MG capsule TAKE 1 CAPSULE(25 MG) BY MOUTH TWICE DAILY AS NEEDED FOR SEVERE ANXIETY OR SYMPTOMS 60 capsule 1  . lidocaine-prilocaine (EMLA) cream Apply to affected area once 30 g 3  . loperamide (IMODIUM) 2 MG capsule Take 2 mg by mouth as needed for diarrhea or loose stools.    . magic mouthwash SOLN Take 5 mLs by mouth 3 (three) times daily as needed for mouth pain. 240 mL 0  . nystatin cream (MYCOSTATIN) Apply 1 application topically 2 (two) times daily. (Patient not taking: Reported on 04/25/2019) 30 g 0  . omeprazole (PRILOSEC) 40 MG capsule Take 1 capsule (40 mg total) by mouth daily. (Patient taking differently: Take 40 mg by mouth every morning. ) 90 capsule 1  . ondansetron (ZOFRAN) 8 MG tablet Take 1 tablet (8 mg total) by mouth 2 (two) times daily as needed for refractory nausea /  vomiting. Start on day 3 after chemo. 30 tablet 1  . oxyCODONE-acetaminophen (PERCOCET) 5-325 MG tablet Take 1 tablet by mouth every 8 (eight) hours as needed for severe pain. 45 tablet 0  . OXYGEN Inhale 2 L into the lungs at bedtime.    . pregabalin (LYRICA) 50 MG capsule Take 1 capsule (50 mg total) by mouth 3 (three) times daily. 90 capsule 2  . prochlorperazine (COMPAZINE) 10 MG tablet Take 1 tablet (10 mg total) by mouth every 6 (six) hours as needed (Nausea or vomiting). 30 tablet 1  . sucralfate (CARAFATE) 1 g tablet Take 1 tablet (1 g total) by mouth 3 (three) times daily. Dissolve in 3-4 tbsp warm water, swish and swallow. 90 tablet 3  . Suvorexant (BELSOMRA) 20 MG TABS Take 20 mg by mouth at bedtime. 30 tablet 2  . traMADol (ULTRAM) 50 MG tablet Take 1 tablet (50 mg total) by mouth every 8 (eight) hours as needed. 90 tablet 0   Current Facility-Administered Medications on File Prior to Visit  Medication Dose Route Frequency Provider Last Rate Last Dose  . ipratropium-albuterol (DUONEB) 0.5-2.5 (3) MG/3ML nebulizer solution 3 mL  3 mL Nebulization Q6H Kota Ciancio, Wandra Feinstein, NP       Physical Exam Constitutional:      Appearance: Normal appearance.  HENT:     Head: Normocephalic and atraumatic.  Eyes:     Pupils: Pupils are equal, round, and reactive to light.  Neck:     Musculoskeletal: Normal range of motion.  Cardiovascular:     Rate and Rhythm: Normal rate and regular rhythm.     Heart sounds: Normal heart sounds. No murmur.  Pulmonary:     Effort: Pulmonary effort is normal.     Breath sounds: Normal breath sounds.  No wheezing.  Abdominal:     General: Bowel sounds are normal. There is no distension.     Palpations: Abdomen is soft.     Tenderness: There is no abdominal tenderness.  Musculoskeletal: Normal range of motion.  Skin:    General: Skin is warm and dry.     Findings: No rash.  Neurological:     Mental Status: She is alert and oriented to person, place, and  time.  Psychiatric:        Judgment: Judgment normal.    Lab Results  Component Value Date   WBC 3.9 (L) 04/27/2019   HGB 9.9 (L) 04/27/2019   HCT 31.7 (L) 04/27/2019   MCV 81.7 04/27/2019   PLT 181 04/27/2019     Chemistry      Component Value Date/Time   NA 140 04/27/2019 0902   NA 137 12/03/2017 1447   NA 137 05/14/2014 1457   K 3.6 04/27/2019 0902   K 3.2 (L) 05/14/2014 1457   CL 105 04/27/2019 0902   CL 103 05/14/2014 1457   CO2 27 04/27/2019 0902   CO2 28 05/14/2014 1457   BUN 15 04/27/2019 0902   BUN 8 12/03/2017 1447   BUN 8 05/14/2014 1457   CREATININE 0.70 04/27/2019 0902   CREATININE 0.79 05/14/2014 1457      Component Value Date/Time   CALCIUM 8.6 (L) 04/27/2019 0902   CALCIUM 8.9 05/14/2014 1457   ALKPHOS 62 04/27/2019 0902   ALKPHOS 87 05/14/2014 1457   AST 21 04/27/2019 0902   AST 9 (L) 05/14/2014 1457   ALT 13 04/27/2019 0902   ALT 15 05/14/2014 1457   BILITOT 0.5 04/27/2019 0902   BILITOT 0.6 12/03/2017 1447   BILITOT 0.2 05/14/2014 1457     Assessment and plan: Patient is a 58 year old female diagnosed with recurrent squamous cell carcinoma currently on carbotaxol with radiation who presents to symptom management clinic for follow-up.  Recurrent squamous cell carcinoma: Status post biopsy of right hilar lymph node, stage III.  Currently status post cycle 2 of carbotaxol weekly chemotherapy along with radiation.  Had increased somnolence with Benadryl as premed.  Plan is to decrease to 25 mg IV with next treatment.  She is scheduled to return to clinic daily for radiation and on 05/01/2019 for cycle 3.  Chemotherapy-induced nausea, vomiting and diarrhea follow-up: Completely resolved per patient.  Plan: Labs today: Look remarkably well. De-access patient's Port-A-Cath.  She will not require IV fluids today.  Vital signs stable. Decrease Benadryl from 50 mg to 25 mg IV as pre-med for chemotherapy next week. Continue Compazine and Zofran for  nausea. Continue Imodium for diarrhea. Patient may require intermittent IV fluids in between treatments.  She was given samples of several different types of supplements including electrolyte replacements, Ensure and Boosts.  I encouraged her to drink supplements if unable to tolerate foods and to attempt to consume 3 a day if unable to have a meal.  Patient may require dietary consult in the future.  Disposition: Return to clinic as scheduled on 05/01/2019 for cycle 3 chemotherapy. Return to clinic daily for radiation. Has colonoscopy scheduled for tomorrow.    ICD-10-CM   1. Primary cancer of right lower lobe of lung (HCC)  C34.31   2. Chemotherapy induced nausea and vomiting  R11.2    T45.1X5A     Patient expressed understanding and was in agreement with this plan.  She also understands that she can call clinic anytime with any questions,  concerns or complaints.  Thank you for allowing me to participate in the care of this very pleasant patient.  Faythe Casa, NP 04/27/2019 2:29 PM  CC: Dr. Rogue Bussing

## 2019-04-28 ENCOUNTER — Encounter
Admission: RE | Disposition: A | Discharge status: Home / Self Care | Payer: Self-pay | Source: Home / Self Care | Attending: Gastroenterology

## 2019-04-28 ENCOUNTER — Ambulatory Visit: Payer: Medicare Other | Admitting: Anesthesiology

## 2019-04-28 ENCOUNTER — Ambulatory Visit
Admission: RE | Admit: 2019-04-28 | Discharge: 2019-04-28 | Disposition: A | Discharge status: Home / Self Care | Payer: Medicare Other | Attending: Gastroenterology | Admitting: Gastroenterology

## 2019-04-28 ENCOUNTER — Other Ambulatory Visit: Payer: Self-pay

## 2019-04-28 ENCOUNTER — Encounter: Payer: Self-pay | Admitting: Anesthesiology

## 2019-04-28 ENCOUNTER — Ambulatory Visit: Payer: Medicare Other

## 2019-04-28 ENCOUNTER — Ambulatory Visit: Payer: Medicare Other | Admitting: Internal Medicine

## 2019-04-28 ENCOUNTER — Other Ambulatory Visit: Payer: Self-pay | Admitting: *Deleted

## 2019-04-28 DIAGNOSIS — G479 Sleep disorder, unspecified: Secondary | ICD-10-CM | POA: Insufficient documentation

## 2019-04-28 DIAGNOSIS — J449 Chronic obstructive pulmonary disease, unspecified: Secondary | ICD-10-CM | POA: Diagnosis not present

## 2019-04-28 DIAGNOSIS — K228 Other specified diseases of esophagus: Secondary | ICD-10-CM | POA: Insufficient documentation

## 2019-04-28 DIAGNOSIS — R1319 Other dysphagia: Secondary | ICD-10-CM

## 2019-04-28 DIAGNOSIS — K5900 Constipation, unspecified: Secondary | ICD-10-CM

## 2019-04-28 DIAGNOSIS — Z96659 Presence of unspecified artificial knee joint: Secondary | ICD-10-CM | POA: Diagnosis not present

## 2019-04-28 DIAGNOSIS — Z87891 Personal history of nicotine dependence: Secondary | ICD-10-CM | POA: Insufficient documentation

## 2019-04-28 DIAGNOSIS — E785 Hyperlipidemia, unspecified: Secondary | ICD-10-CM | POA: Diagnosis not present

## 2019-04-28 DIAGNOSIS — Z7982 Long term (current) use of aspirin: Secondary | ICD-10-CM | POA: Insufficient documentation

## 2019-04-28 DIAGNOSIS — Z923 Personal history of irradiation: Secondary | ICD-10-CM | POA: Insufficient documentation

## 2019-04-28 DIAGNOSIS — Z888 Allergy status to other drugs, medicaments and biological substances status: Secondary | ICD-10-CM | POA: Diagnosis not present

## 2019-04-28 DIAGNOSIS — K219 Gastro-esophageal reflux disease without esophagitis: Secondary | ICD-10-CM | POA: Diagnosis not present

## 2019-04-28 DIAGNOSIS — Z9221 Personal history of antineoplastic chemotherapy: Secondary | ICD-10-CM | POA: Diagnosis not present

## 2019-04-28 DIAGNOSIS — Z96653 Presence of artificial knee joint, bilateral: Secondary | ICD-10-CM | POA: Insufficient documentation

## 2019-04-28 DIAGNOSIS — F419 Anxiety disorder, unspecified: Secondary | ICD-10-CM | POA: Insufficient documentation

## 2019-04-28 DIAGNOSIS — M1990 Unspecified osteoarthritis, unspecified site: Secondary | ICD-10-CM | POA: Diagnosis not present

## 2019-04-28 DIAGNOSIS — R1310 Dysphagia, unspecified: Secondary | ICD-10-CM | POA: Diagnosis not present

## 2019-04-28 DIAGNOSIS — Z8521 Personal history of malignant neoplasm of larynx: Secondary | ICD-10-CM | POA: Diagnosis not present

## 2019-04-28 DIAGNOSIS — F329 Major depressive disorder, single episode, unspecified: Secondary | ICD-10-CM | POA: Diagnosis not present

## 2019-04-28 DIAGNOSIS — Z79899 Other long term (current) drug therapy: Secondary | ICD-10-CM | POA: Insufficient documentation

## 2019-04-28 DIAGNOSIS — Z1211 Encounter for screening for malignant neoplasm of colon: Secondary | ICD-10-CM | POA: Insufficient documentation

## 2019-04-28 DIAGNOSIS — C3431 Malignant neoplasm of lower lobe, right bronchus or lung: Secondary | ICD-10-CM

## 2019-04-28 DIAGNOSIS — C3490 Malignant neoplasm of unspecified part of unspecified bronchus or lung: Secondary | ICD-10-CM | POA: Diagnosis not present

## 2019-04-28 DIAGNOSIS — K222 Esophageal obstruction: Secondary | ICD-10-CM | POA: Diagnosis not present

## 2019-04-28 DIAGNOSIS — Z86010 Personal history of colonic polyps: Secondary | ICD-10-CM | POA: Diagnosis not present

## 2019-04-28 DIAGNOSIS — Z885 Allergy status to narcotic agent status: Secondary | ICD-10-CM | POA: Diagnosis not present

## 2019-04-28 DIAGNOSIS — Z8719 Personal history of other diseases of the digestive system: Secondary | ICD-10-CM | POA: Diagnosis not present

## 2019-04-28 HISTORY — PX: COLONOSCOPY WITH PROPOFOL: SHX5780

## 2019-04-28 HISTORY — PX: ESOPHAGOGASTRODUODENOSCOPY (EGD) WITH PROPOFOL: SHX5813

## 2019-04-28 SURGERY — COLONOSCOPY WITH PROPOFOL
Anesthesia: General

## 2019-04-28 MED ORDER — FENTANYL CITRATE (PF) 100 MCG/2ML IJ SOLN
INTRAMUSCULAR | Status: AC
  Filled 2019-04-28: qty 2

## 2019-04-28 MED ORDER — PROPOFOL 500 MG/50ML IV EMUL
INTRAVENOUS | Status: DC | PRN
  Administered 2019-04-28: 140 ug/kg/min via INTRAVENOUS

## 2019-04-28 MED ORDER — FENTANYL CITRATE (PF) 100 MCG/2ML IJ SOLN
INTRAMUSCULAR | Status: DC | PRN
  Administered 2019-04-28: 25 ug via INTRAVENOUS

## 2019-04-28 MED ORDER — GLYCOPYRROLATE 0.2 MG/ML IJ SOLN
INTRAMUSCULAR | Status: AC
  Filled 2019-04-28: qty 1

## 2019-04-28 MED ORDER — PROPOFOL 500 MG/50ML IV EMUL
INTRAVENOUS | Status: AC
  Filled 2019-04-28: qty 50

## 2019-04-28 MED ORDER — PROPOFOL 10 MG/ML IV BOLUS
INTRAVENOUS | Status: DC | PRN
  Administered 2019-04-28: 60 mg via INTRAVENOUS

## 2019-04-28 MED ORDER — EPHEDRINE SULFATE 50 MG/ML IJ SOLN
INTRAMUSCULAR | Status: AC
  Filled 2019-04-28: qty 2

## 2019-04-28 MED ORDER — SUCCINYLCHOLINE CHLORIDE 20 MG/ML IJ SOLN
INTRAMUSCULAR | Status: AC
  Filled 2019-04-28: qty 1

## 2019-04-28 MED ORDER — LIDOCAINE HCL (PF) 2 % IJ SOLN
INTRAMUSCULAR | Status: AC
  Filled 2019-04-28: qty 80

## 2019-04-28 MED ORDER — SODIUM CHLORIDE 0.9 % IV SOLN
INTRAVENOUS | Status: DC
  Administered 2019-04-28: 10:00:00 1000 mL via INTRAVENOUS

## 2019-04-28 MED ORDER — PHENYLEPHRINE HCL (PRESSORS) 10 MG/ML IV SOLN
INTRAVENOUS | Status: AC
  Filled 2019-04-28: qty 3

## 2019-04-28 MED ORDER — PHENYLEPHRINE HCL (PRESSORS) 10 MG/ML IV SOLN
INTRAVENOUS | Status: DC | PRN
  Administered 2019-04-28: 100 ug via INTRAVENOUS

## 2019-04-28 MED ORDER — LIDOCAINE HCL (PF) 2 % IJ SOLN
INTRAMUSCULAR | Status: AC
  Filled 2019-04-28: qty 10

## 2019-04-28 MED ORDER — DEXAMETHASONE SODIUM PHOSPHATE 10 MG/ML IJ SOLN
INTRAMUSCULAR | Status: AC
  Filled 2019-04-28: qty 1

## 2019-04-28 MED ORDER — ONDANSETRON HCL 4 MG/2ML IJ SOLN
INTRAMUSCULAR | Status: AC
  Filled 2019-04-28: qty 2

## 2019-04-28 MED ORDER — GLYCOPYRROLATE 0.2 MG/ML IJ SOLN
INTRAMUSCULAR | Status: AC
  Filled 2019-04-28: qty 3

## 2019-04-28 MED ORDER — ROCURONIUM BROMIDE 50 MG/5ML IV SOLN
INTRAVENOUS | Status: AC
  Filled 2019-04-28: qty 1

## 2019-04-28 NOTE — Anesthesia Preprocedure Evaluation (Signed)
Anesthesia Evaluation  Patient identified by MRN, date of birth, ID band Patient awake    Reviewed: Allergy & Precautions, NPO status , Patient's Chart, lab work & pertinent test results, reviewed documented beta blocker date and time   Airway Mallampati: II  TM Distance: >3 FB     Dental  (+) Chipped, Lower Dentures, Upper Dentures   Pulmonary shortness of breath, asthma , COPD, former smoker,    Pulmonary exam normal        Cardiovascular Normal cardiovascular exam+ dysrhythmias      Neuro/Psych  Headaches, PSYCHIATRIC DISORDERS Anxiety Depression  Neuromuscular disease    GI/Hepatic Neg liver ROS, GERD  Controlled,  Endo/Other  negative endocrine ROS  Renal/GU negative Renal ROS     Musculoskeletal  (+) Arthritis ,   Abdominal Normal abdominal exam  (+)   Peds  Hematology  (+) anemia ,   Anesthesia Other Findings   Reproductive/Obstetrics                             Anesthesia Physical  Anesthesia Plan  ASA: III  Anesthesia Plan: General   Post-op Pain Management:    Induction: Intravenous  PONV Risk Score and Plan:   Airway Management Planned: Nasal Cannula  Additional Equipment:   Intra-op Plan:   Post-operative Plan:   Informed Consent: I have reviewed the patients History and Physical, chart, labs and discussed the procedure including the risks, benefits and alternatives for the proposed anesthesia with the patient or authorized representative who has indicated his/her understanding and acceptance.       Plan Discussed with: CRNA and Surgeon  Anesthesia Plan Comments:         Anesthesia Quick Evaluation

## 2019-04-28 NOTE — H&P (Signed)
Jonathon Bellows, MD [address] [place], [place], [place], [address] [address], [place], [place], [place], [address] Phone: [phone]  Fax: [phone]  Primary Care Physician:  Valerie Roys, DO   Pre-Procedure History & Physical: HPI:  Susan Houston is a 58 y.o. female is here for an endoscopy and colonoscopy    Past Medical History:  Diagnosis Date  . Anemia    vitamin b12 deficiency. no longer taking supplements  . Anxiety   . Arthritis   . Asthma   . Back pain   . Bruises easily   . Chronic leg pain    BILATERAL  . COPD (chronic obstructive pulmonary disease) (HCC)    no inhalers for over 1 year  . Depression   . Difficulty sleeping   . Diverticulitis   . Dyspnea    due to having part of lung removed in 2019  . Dysrhythmia 04/2018   brady episodes. cleared by dr. Clayborn Bigness  . Facet syndrome, lumbar 03/28/2015  . GERD (gastroesophageal reflux disease)   . Hemorrhoids   . History of kidney stones 04/2018   recently passed stone  . Hyperlipidemia   . Incontinence of urine   . Laryngeal cancer ([place]) 2009   LARYNX CANCER - CHEMO / RADIATION (NO SURGERY)   . Migraine   . Migraine without aura and without status migrainosus, not intractable 09/11/2016  . Mild cognitive impairment 11/23/2017  . MRSA infection greater than 3 months ago    2008 right side of face  . Nerve damage    right leg. thinks this is from her TKR  . Nodule of lower lobe of right lung 04/22/2018  . Oxygen deficiency    2L/HS  . Primary cancer of right lower lobe of lung ([facility]) 04/18/2018   Surgical resection of RLL  . Wears dentures    full upper and lower    Past Surgical History:  Procedure Laterality Date  . ABDOMINAL HYSTERECTOMY  1983  . BLADDER SUSPENSION  2014  . CATARACT EXTRACTION W/PHACO Left 05/11/2018   Procedure: CATARACT EXTRACTION PHACO AND INTRAOCULAR LENS PLACEMENT ([facility]) LEFT;  Surgeon: Leandrew Koyanagi, MD;  Location: [facility];   Service: Ophthalmology;  Laterality: Left;  PREFERS EARLY  . CATARACT EXTRACTION W/PHACO Right 08/11/2018   Procedure: CATARACT EXTRACTION PHACO AND INTRAOCULAR LENS PLACEMENT ([facility]);  Surgeon: Leandrew Koyanagi, MD;  Location: [facility];  Service: Ophthalmology;  Laterality: Right;  CDE 5:02 Total Time 01:35 Fluid lot # [id number]  . CHOLECYSTECTOMY  1998  . COLONOSCOPY    . ENDOBRONCHIAL ULTRASOUND N/A 03/20/2019   Procedure: ENDOBRONCHIAL ULTRASOUND;  Surgeon: Flora Lipps, MD;  Location: [facility];  Service: Cardiopulmonary;  Laterality: N/A;  . ESOPHAGOGASTRODUODENOSCOPY  2015  . ESOPHAGOGASTRODUODENOSCOPY (EGD) WITH PROPOFOL N/A 11/20/2016   Procedure: ESOPHAGOGASTRODUODENOSCOPY (EGD) WITH PROPOFOL;  Surgeon: Jonathon Bellows, MD;  Location: [facility];  Service: Endoscopy;  Laterality: N/A;  . EXCISION NEUROMA Bilateral 09/24/2014   Procedure: BILATERAL OPEN SAPHENOUS NEURECTOMIES AND OPEN LEFT PERIPATELLA OSETOPHYTECTOMY;  Surgeon: Mauri Pole, MD;  Location: [facility];  Service: Orthopedics;  Laterality: Bilateral;  . EYE SURGERY Left 05/11/2018   cataract extraction  . JOINT REPLACEMENT  2009/2010   BIL TOTAL KNEES  . JOINT REPLACEMENT  2020   left  . PORTACATH PLACEMENT N/A 04/05/2019   Procedure: INSERTION PORT-A-CATH;  Surgeon: Nestor Lewandowsky, MD;  Location: [facility];  Service: General;  Laterality: N/A;  .  THORACOTOMY/LOBECTOMY Right 05/16/2018   Procedure: THORACOTOMY/POSSIBLE LOBECTOMY;  Surgeon: Nestor Lewandowsky, MD;  Location: [facility];  Service: Thoracic;  Laterality: Right;  . TONSILLECTOMY    . TOTAL KNEE REVISION Left 11/29/2018   Procedure: Left TOTAL KNEE REVISION;  Surgeon: Hessie Knows, MD;  Location: [facility];  Service: Orthopedics;  Laterality: Left;  Marland Kitchen VIDEO BRONCHOSCOPY N/A 05/16/2018   Procedure: PREOP  BRONCHOSCOPY;  Surgeon: Nestor Lewandowsky, MD;  Location: [facility];  Service: Thoracic;  Laterality: N/A;    Prior to Admission medications   Medication Sig Start Date End  Date Taking? Authorizing Provider  albuterol (PROVENTIL HFA;VENTOLIN HFA) 108 (90 Base) MCG/ACT inhaler Inhale 2 puffs into the lungs every 4 (four) hours as needed for wheezing or shortness of breath. 07/01/18  Yes Flora Lipps, MD  albuterol (PROVENTIL) (2.5 MG/3ML) 0.083% nebulizer solution Take 3 mLs (2.5 mg total) by nebulization every 4 (four) hours as needed for wheezing or shortness of breath. 01/20/19  Yes Flora Lipps, MD  aspirin EC 81 MG tablet Take 81 mg by mouth daily.   Yes [provider]  aspirin-acetaminophen-caffeine (EXCEDRIN MIGRAINE) [phone] MG tablet Take 1 tablet by mouth daily as needed for headache.   Yes [provider]  clonazePAM (KLONOPIN) 0.5 MG tablet Take 0.5-1 tablets (0.25-0.5 mg total) by mouth daily as needed for anxiety. Try to limit use and use only for severe anxiety and sleep problems 04/05/19  Yes Eappen, Ria Clock, MD  estradiol (ESTRACE) 1 MG tablet Take 1 mg by mouth daily.  01/17/19  Yes [provider]  FLUoxetine (PROZAC) 20 MG capsule Take 1 capsule (20 mg total) by mouth daily. To be combined with 40 mg 04/05/19  Yes Eappen, Ria Clock, MD  FLUoxetine (PROZAC) 40 MG capsule Take 1 capsule (40 mg total) by mouth daily. To be combined with 20 mg 04/05/19  Yes Eappen, Ria Clock, MD  guaiFENesin-codeine 100-10 MG/5ML syrup Take 10 mLs by mouth 3 (three) times daily as needed for cough. 11/17/18  Yes Crissman, Jeannette How, MD  hydrOXYzine (VISTARIL) 25 MG capsule TAKE 1 CAPSULE(25 MG) BY MOUTH TWICE DAILY AS NEEDED FOR SEVERE ANXIETY OR SYMPTOMS 04/13/19  Yes Eappen, Ria Clock, MD  lidocaine-prilocaine (EMLA) cream Apply to affected area once 04/10/19  Yes Cammie Sickle, MD  loperamide (IMODIUM) 2 MG capsule Take 2 mg by mouth as needed for diarrhea or loose stools.   Yes [provider]  magic mouthwash SOLN Take 5 mLs by mouth 3 (three) times daily as needed for mouth pain. 04/25/19  Yes Cammie Sickle, MD  nystatin cream  (MYCOSTATIN) Apply 1 application topically 2 (two) times daily. 02/23/19  Yes Johnson, Megan P, DO  omeprazole (PRILOSEC) 40 MG capsule Take 1 capsule (40 mg total) by mouth daily. Patient taking differently: Take 40 mg by mouth every morning.  01/16/19  Yes Jonathon Bellows, MD  ondansetron (ZOFRAN) 8 MG tablet Take 1 tablet (8 mg total) by mouth 2 (two) times daily as needed for refractory nausea / vomiting. Start on day 3 after chemo. 04/10/19  Yes Cammie Sickle, MD  oxyCODONE-acetaminophen (PERCOCET) 5-325 MG tablet Take 1 tablet by mouth every 8 (eight) hours as needed for severe pain. 04/17/19 04/16/20 Yes Cammie Sickle, MD  OXYGEN Inhale 2 L into the lungs at bedtime.   Yes [provider]  prochlorperazine (COMPAZINE) 10 MG tablet Take 1 tablet (10 mg total) by mouth every 6 (six) hours as needed (Nausea or vomiting). 04/10/19  Yes Brahmanday,  Elisha Headland, MD  sucralfate (CARAFATE) 1 g tablet Take 1 tablet (1 g total) by mouth 3 (three) times daily. Dissolve in 3-4 tbsp warm water, swish and swallow. 04/18/19  Yes Chrystal, Eulas Post, MD  Suvorexant (BELSOMRA) 20 MG TABS Take 20 mg by mouth at bedtime. 02/08/19  Yes Ursula Alert, MD  traMADol (ULTRAM) 50 MG tablet Take 1 tablet (50 mg total) by mouth every 8 (eight) hours as needed. 04/24/19  Yes Cammie Sickle, MD  pregabalin (LYRICA) 50 MG capsule Take 1 capsule (50 mg total) by mouth 3 (three) times daily. 03/07/19 06/05/19  Vevelyn Francois, NP    Allergies as of 04/18/2019 - Review Complete 04/05/2019  Allergen Reaction Noted  . Morphine and related Itching 03/26/2017  . Cymbalta [duloxetine hcl] Other (See Comments) 09/16/2018  . Adhesive [tape] Other (See Comments) 05/12/2018  . Lexapro [escitalopram oxalate] Other (See Comments) 09/07/2014    Family History  Problem Relation Age of Onset  . Asthma Mother   . Diabetes Mother   . Hyperlipidemia Mother   . Hypertension Mother   . Cirrhosis Mother         Non-alcoholic  . Arthritis Father   . Cancer Father        Bone  . Hyperlipidemia Father   . Hypertension Father   . Anxiety disorder Sister   . Depression Sister   . Kidney Stones Son   . Stroke Maternal Grandmother   . Breast cancer Maternal Grandmother 15  . Cancer Maternal Grandfather        lung  . Pneumonia Paternal Grandmother   . Alzheimer's disease Paternal Grandfather   . Diabetes Sister   . Gout Sister   . Hypertension Sister   . Alcohol abuse Brother   . Heart disease Brother        massive MI    Social History   Socioeconomic History  . Marital status: Married    Spouse name: Tharon Aquas  . Number of children: 2  . Years of education: 16  . Highest education level: 12th grade  Occupational History  . Occupation: Disabled  Social Needs  . Financial resource strain: Not hard at all  . Food insecurity    Worry: Never true    Inability: Never true  . Transportation needs    Medical: No    Non-medical: No  Tobacco Use  . Smoking status: Former Smoker    Packs/day: 0.50    Types: Cigarettes    Quit date: 04/07/2016    Years since quitting: 3.0  . Smokeless tobacco: Never Used  . Tobacco comment: patient has not smoked x 10 days.06/28/17 chantix  Substance and Sexual Activity  . Alcohol use: No    Alcohol/week: 0.0 standard drinks  . Drug use: No  . Sexual activity: Yes    Partners: Male    Birth control/protection: None  Lifestyle  . Physical activity    Days per week: 0 days    Minutes per session: 0 min  . Stress: Not at all  Relationships  . Social Herbalist on phone: Never    Gets together: Twice a week    Attends religious service: More than 4 times per year    Active member of club or organization: No    Attends meetings of clubs or organizations: Never    Relationship status: Married  . Intimate partner violence    Fear of current or ex partner: No    Emotionally abused:  No    Physically abused: No    Forced sexual activity:  No  Other Topics Concern  . Not on file  Social History Narrative   Lives at home w/ her husband   Right-handed   Caffeine: occasionally    Review of Systems: See HPI, otherwise negative ROS  Physical Exam: BP (!) 142/83   Pulse 96   Temp (!) 97.4 F (36.3 C) (Tympanic)   Resp 20   Ht 5\' 2"  (1.575 m)   Wt 90.7 kg   SpO2 97%   BMI 36.58 kg/m  General:   Alert,  pleasant and cooperative in NAD Head:  Normocephalic and atraumatic. Neck:  Supple; no masses or thyromegaly. Lungs:  Clear throughout to auscultation, normal respiratory effort.    Heart:  +S1, +S2, Regular rate and rhythm, No edema. Abdomen:  Soft, nontender and nondistended. Normal bowel sounds, without guarding, and without rebound.   Neurologic:  Alert and  oriented x4;  grossly normal neurologically.  Impression/Plan: Susan Houston is here for an endoscopy and colonoscopy  to be performed for  evaluation of dysphagia and prior history of colon polyps    Risks, benefits, limitations, and alternatives regarding endoscopy have been reviewed with the patient.  Questions have been answered.  All parties agreeable.   Jonathon Bellows, MD  04/28/2019, 10:13 AM

## 2019-04-28 NOTE — Transfer of Care (Signed)
Immediate Anesthesia Transfer of Care Note  Patient: Susan Houston  Procedure(s) Performed: COLONOSCOPY WITH PROPOFOL (N/A ) ESOPHAGOGASTRODUODENOSCOPY (EGD) WITH PROPOFOL (N/A )  Patient Location: PACU  Anesthesia Type:General  Level of Consciousness: sedated  Airway & Oxygen Therapy: Patient Spontanous Breathing and Patient connected to nasal cannula oxygen  Post-op Assessment: Report given to RN and Post -op Vital signs reviewed and stable  Post vital signs: Reviewed and stable  Last Vitals:  Vitals Value Taken Time  BP 121/68 04/28/19 1052  Temp 36.6 C 04/28/19 1052  Pulse 79 04/28/19 1052  Resp 16 04/28/19 1052  SpO2 100 % 04/28/19 1052    Last Pain:  Vitals:   04/28/19 1052  TempSrc:   PainSc: 0-No pain         Complications: No apparent anesthesia complications

## 2019-04-28 NOTE — Anesthesia Post-op Follow-up Note (Signed)
Anesthesia QCDR form completed.        

## 2019-04-28 NOTE — Op Note (Signed)
[facility] Gastroenterology Patient Name: Susan Houston Procedure Date: 04/28/2019 10:20 AM MRN: [id number] Account #: [id number] Date of Birth: 07-29-1961 Admit Type: Outpatient Age: 58 Room: [facility] ENDO ROOM 4 Gender: Female Note Status: Finalized Procedure:            Colonoscopy Indications:          High risk colon cancer surveillance: Personal history                        of colonic polyps Providers:            Jonathon Bellows MD, MD Referring MD:         Valerie Roys (Referring MD) Medicines:            Monitored Anesthesia Care Complications:        No immediate complications. Procedure:            Pre-Anesthesia Assessment:                       - Prior to the procedure, a History and Physical was                        performed, and patient medications, allergies and                        sensitivities were reviewed. The patient's tolerance of                        previous anesthesia was reviewed.                       - The risks and benefits of the procedure and the                        sedation options and risks were discussed with the                        patient. All questions were answered and informed                        consent was obtained.                       - ASA Grade Assessment: III - A patient with severe                        systemic disease.                       After obtaining informed consent, the colonoscope was                        passed under direct vision. Throughout the procedure,                        the patient's blood pressure, pulse, and oxygen                        saturations were monitored continuously. The                        Colonoscope  was introduced through the anus and                        advanced to the the cecum, identified by the                        appendiceal orifice, IC valve and transillumination.                        The colonoscopy was performed with ease. The patient              tolerated the procedure well. The quality of the bowel                        preparation was good. Findings:      The perianal and digital rectal examinations were normal.      The entire examined colon appeared normal on direct and retroflexion       views. Impression:           - The entire examined colon is normal on direct and                        retroflexion views.                       - No specimens collected. Recommendation:       - Discharge patient to home (with escort).                       - Resume previous diet.                       - Continue present medications.                       - Repeat colonoscopy in 5 years for surveillance. Procedure Code(s):    --- Professional ---                       [phone], Colonoscopy, flexible; diagnostic, including                        collection of specimen(s) by brushing or washing, when                        performed (separate procedure) Diagnosis Code(s):    --- Professional ---                       Z86.010, Personal history of colonic polyps CPT copyright 2019 American Medical Association. All rights reserved. The codes documented in this report are preliminary and upon coder review may  be revised to meet current compliance requirements. Jonathon Bellows, MD Jonathon Bellows MD, MD 04/28/2019 10:49:29 AM This report has been signed electronically. Number of Addenda: 0 Note Initiated On: 04/28/2019 10:20 AM Scope Withdrawal Time: 0 hours 7 minutes 45 seconds  Total Procedure Duration: 0 hours 10 minutes 46 seconds  Estimated Blood Loss: Estimated blood loss: none.      [facility]

## 2019-04-28 NOTE — Op Note (Signed)
[facility] Gastroenterology Patient Name: Susan Houston Procedure Date: 04/28/2019 10:21 AM MRN: [id number] Account #: [id number] Date of Birth: 1960-12-14 Admit Type: Outpatient Age: 58 Room: [facility] ENDO ROOM 4 Gender: Female Note Status: Finalized Procedure:            Upper GI endoscopy Indications:          Dysphagia Providers:            Jonathon Bellows MD, MD Referring MD:         Valerie Roys (Referring MD) Medicines:            Monitored Anesthesia Care Complications:        No immediate complications. Procedure:            Pre-Anesthesia Assessment:                       - Prior to the procedure, a History and Physical was                        performed, and patient medications, allergies and                        sensitivities were reviewed. The patient's tolerance of                        previous anesthesia was reviewed.                       - The risks and benefits of the procedure and the                        sedation options and risks were discussed with the                        patient. All questions were answered and informed                        consent was obtained.                       - ASA Grade Assessment: III - A patient with severe                        systemic disease.                       After obtaining informed consent, the endoscope was                        passed under direct vision. Throughout the procedure,                        the patient's blood pressure, pulse, and oxygen                        saturations were monitored continuously. The Endoscope                        was introduced through the mouth, and advanced to the  third part of duodenum. The upper GI endoscopy was                        accomplished with ease. The patient tolerated the                        procedure well. Findings:      The examined duodenum was normal.      The stomach was normal.      The cardia and gastric  fundus were normal on retroflexion.      One benign-appearing, intrinsic mild stenosis was found at the       gastroesophageal junction. This stenosis measured less than one cm (in       length). The stenosis was traversed. A TTS dilator was passed through       the scope. Dilation with a 15-16.5-18 mm balloon dilator was performed       to 18 mm. The dilation site was examined following endoscope reinsertion       and showed mild mucosal disruption.      A hypertonic lower esophageal sphincter was found. There was moderate       resistance to endoscope advancement into the stomach. The Z-line was       regular.      Mucosal changes including ringed esophagus were found in the upper third       of the esophagus, in the middle third of the esophagus and in the lower       third of the esophagus. Esophageal findings were graded using the       Eosinophilic Esophagitis Endoscopic Reference Score (EoE-EREFS) as:       Edema Grade 1 Present (decreased clarity or absence of vascular       markings), Rings Grade 1 Mild (subtle circumferential ridges seen on       esophageal distension), Exudates Grade 0 None (no white lesions seen),       Furrows Grade 0 None (no vertical lines seen) and Stricture present.       Biopsies were obtained from the proximal and distal esophagus with cold       forceps for histology of suspected eosinophilic esophagitis. Impression:           - Normal examined duodenum.                       - Normal stomach.                       - Benign-appearing esophageal stenosis. Dilated.                       - The examination was suspicious for achalasia.                       - Esophageal mucosal changes suspicious for                        eosinophilic esophagitis. Biopsied. Recommendation:       - Perform ambulatory esophageal manometry if symptoms                        persist.                       -  Perform a colonoscopy today. Procedure Code(s):    --- Professional  ---                       [phone], Esophagogastroduodenoscopy, flexible, transoral;                        with transendoscopic balloon dilation of esophagus                        (less than 30 mm diameter)                       [id number], 59, Esophagogastroduodenoscopy, flexible,                        transoral; with biopsy, single or multiple Diagnosis Code(s):    --- Professional ---                       K22.2, Esophageal obstruction                       K22.8, Other specified diseases of esophagus                       R13.10, Dysphagia, unspecified CPT copyright 2019 American Medical Association. All rights reserved. The codes documented in this report are preliminary and upon coder review may  be revised to meet current compliance requirements. Jonathon Bellows, MD Jonathon Bellows MD, MD 04/28/2019 10:35:17 AM This report has been signed electronically. Number of Addenda: 0 Note Initiated On: 04/28/2019 10:21 AM Estimated Blood Loss: Estimated blood loss: none.      [facility]

## 2019-05-01 ENCOUNTER — Inpatient Hospital Stay: Payer: Medicare Other

## 2019-05-01 ENCOUNTER — Ambulatory Visit
Admission: RE | Admit: 2019-05-01 | Discharge: 2019-05-01 | Disposition: A | Discharge status: Home / Self Care | Payer: Medicare Other | Source: Ambulatory Visit | Attending: Oncology | Admitting: Oncology

## 2019-05-01 ENCOUNTER — Encounter: Payer: Self-pay | Admitting: Gastroenterology

## 2019-05-01 ENCOUNTER — Other Ambulatory Visit: Payer: Self-pay

## 2019-05-01 ENCOUNTER — Ambulatory Visit: Payer: Medicare Other

## 2019-05-01 ENCOUNTER — Ambulatory Visit
Admission: RE | Admit: 2019-05-01 | Discharge: 2019-05-01 | Disposition: A | Discharge status: Home / Self Care | Payer: Medicare Other | Source: Ambulatory Visit | Attending: Cardiothoracic Surgery | Admitting: Cardiothoracic Surgery

## 2019-05-01 ENCOUNTER — Inpatient Hospital Stay (HOSPITAL_BASED_OUTPATIENT_CLINIC_OR_DEPARTMENT_OTHER): Payer: Medicare Other | Admitting: Oncology

## 2019-05-01 ENCOUNTER — Inpatient Hospital Stay: Payer: Medicare Other | Admitting: Internal Medicine

## 2019-05-01 DIAGNOSIS — R509 Fever, unspecified: Secondary | ICD-10-CM | POA: Insufficient documentation

## 2019-05-01 DIAGNOSIS — R079 Chest pain, unspecified: Secondary | ICD-10-CM | POA: Diagnosis not present

## 2019-05-01 DIAGNOSIS — C3431 Malignant neoplasm of lower lobe, right bronchus or lung: Secondary | ICD-10-CM | POA: Diagnosis not present

## 2019-05-01 DIAGNOSIS — R05 Cough: Secondary | ICD-10-CM | POA: Diagnosis not present

## 2019-05-01 NOTE — Progress Notes (Signed)
Virtual Visit via Telephone Note  I connected with Susan Houston on 05/01/19 at  1:30 PM EDT by telephone and verified that I am speaking with the correct person using two identifiers.  Location: Patient: Home Provider: Home   I discussed the limitations, risks, security and privacy concerns of performing an evaluation and management service by telephone and the availability of in person appointments. I also discussed with the patient that there may be a patient responsible charge related to this service. The patient expressed understanding and agreed to proceed.  Chief Complaint: Fever  History of Present Illness: Patient with recent diagnosis of recurrent lung cancer.  Currently on weekly carbo/Taxol with daily radiation.  Last receive chemotherapy on 04/24/2019.  She was evaluated in [facility] on 04/25/2019 and again on 04/27/2019 for nausea, vomiting and diarrhea.  She was given IV fluids and IV antiemetics.  Symptoms improved.    Completed colonoscopy/endoscopy on 04/28/2019 due to history of dysphasia and colon polyps.  Today, patient presents for daily radiation and weekly carbo/Taxol noted to have a low-grade temperature of 100.6.  She was given a disposable thermometer and instructed to return home to monitor her temperatures and follow-up with a virtual visit.  Patient states she feels "fine".  She was tested for COVID approximately 7 days ago which resulted and was negative.  She was unaware of current fever.  Has chronic cough but denies congestion.  She denies shortness of breath or chest pain.  Admits to new left shoulder pain that began last night.  She denies injury or heavy lifting.  States she does not have a cardiac history.  Does admit to an echocardiogram and stress test last year performed by Dr. Towanda Malkin.  States it is intermittent.  She is unhappy she had to miss for treatment because she feels fine.  Her temperatures since returning home have been 97.9, 96.8, 99.9, and 96.8.  She has  not taken any Tylenol.  Consulted with Dr. Rogue Bussing who recommends chest x-ray, UA/urine culture and follow-up tomorrow in clinic as long as she remains afebrile.  Observations/Objective: Limited due to telephone visit.  Review of Systems  Constitutional: Positive for fever and malaise/fatigue. Negative for chills and weight loss.  HENT: Negative for congestion, ear pain and tinnitus.   Eyes: Negative.  Negative for blurred vision and double vision.  Respiratory: Positive for cough and shortness of breath. Negative for sputum production.   Cardiovascular: Negative.  Negative for chest pain, palpitations and leg swelling.  Gastrointestinal: Negative.  Negative for abdominal pain, constipation, diarrhea, nausea and vomiting.  Genitourinary: Negative for dysuria, frequency and urgency.  Musculoskeletal: Negative for back pain and falls.  Skin: Negative.  Negative for rash.  Neurological: Negative.  Negative for weakness and headaches.  Endo/Heme/Allergies: Negative.  Does not bruise/bleed easily.  Psychiatric/Behavioral: Negative.  Negative for depression. The patient is not nervous/anxious and does not have insomnia.    Assessment and Plan:  Stat chest x-ray today. UA/urine culture. (To be completed with labs tomorrow) Reschedule carbo/Taxol for tomorrow after radiation with MD assessment and labs. Continue to monitor temperature and keep diary.  Follow Up Instructions:  Complete chest x-ray today. RTC tomorrow for radiation, MD assessment, carbo/Taxol and labs.  I discussed the assessment and treatment plan with the patient. The patient was provided an opportunity to ask questions and all were answered. The patient agreed with the plan and demonstrated an understanding of the instructions.   The patient was advised to call back or seek  an in-person evaluation if the symptoms worsen or if the condition fails to improve as anticipated.  I provided 25 minutes of non-face-to-face  time during this encounter.   Jacquelin Hawking, NP

## 2019-05-01 NOTE — Anesthesia Postprocedure Evaluation (Signed)
Anesthesia Post Note  Patient: Susan [place]  Procedure(s) Performed: COLONOSCOPY WITH PROPOFOL (N/A ) ESOPHAGOGASTRODUODENOSCOPY (EGD) WITH PROPOFOL (N/A )  Patient location during evaluation: Endoscopy Anesthesia Type: General Level of consciousness: awake and alert and oriented Pain management: pain level controlled Vital Signs Assessment: post-procedure vital signs reviewed and stable Respiratory status: spontaneous breathing Cardiovascular status: blood pressure returned to baseline Anesthetic complications: no     Last Vitals:  Vitals:   04/28/19 1102 04/28/19 1112  BP: 102/61 126/69  Pulse: 76 72  Resp: (!) 24 15  Temp:    SpO2: 100% 97%    Last Pain:  Vitals:   04/29/19 0937  TempSrc:   PainSc: 0-No pain                 Kamree Wiens

## 2019-05-02 ENCOUNTER — Ambulatory Visit
Admission: RE | Admit: 2019-05-02 | Discharge: 2019-05-02 | Disposition: A | Discharge status: Home / Self Care | Payer: Medicare Other | Source: Ambulatory Visit | Attending: Radiation Oncology | Admitting: Radiation Oncology

## 2019-05-02 ENCOUNTER — Other Ambulatory Visit: Payer: Self-pay

## 2019-05-02 ENCOUNTER — Inpatient Hospital Stay: Payer: Medicare Other

## 2019-05-02 ENCOUNTER — Ambulatory Visit: Payer: Medicare Other

## 2019-05-02 ENCOUNTER — Inpatient Hospital Stay (HOSPITAL_BASED_OUTPATIENT_CLINIC_OR_DEPARTMENT_OTHER): Payer: Medicare Other | Admitting: Internal Medicine

## 2019-05-02 DIAGNOSIS — R5383 Other fatigue: Secondary | ICD-10-CM | POA: Diagnosis not present

## 2019-05-02 DIAGNOSIS — Z7982 Long term (current) use of aspirin: Secondary | ICD-10-CM

## 2019-05-02 DIAGNOSIS — C3431 Malignant neoplasm of lower lobe, right bronchus or lung: Secondary | ICD-10-CM

## 2019-05-02 DIAGNOSIS — F329 Major depressive disorder, single episode, unspecified: Secondary | ICD-10-CM

## 2019-05-02 DIAGNOSIS — R112 Nausea with vomiting, unspecified: Secondary | ICD-10-CM | POA: Diagnosis not present

## 2019-05-02 DIAGNOSIS — Z8521 Personal history of malignant neoplasm of larynx: Secondary | ICD-10-CM

## 2019-05-02 DIAGNOSIS — E785 Hyperlipidemia, unspecified: Secondary | ICD-10-CM

## 2019-05-02 DIAGNOSIS — J449 Chronic obstructive pulmonary disease, unspecified: Secondary | ICD-10-CM

## 2019-05-02 DIAGNOSIS — F419 Anxiety disorder, unspecified: Secondary | ICD-10-CM

## 2019-05-02 DIAGNOSIS — C771 Secondary and unspecified malignant neoplasm of intrathoracic lymph nodes: Secondary | ICD-10-CM | POA: Diagnosis not present

## 2019-05-02 DIAGNOSIS — Z87891 Personal history of nicotine dependence: Secondary | ICD-10-CM | POA: Diagnosis not present

## 2019-05-02 DIAGNOSIS — Z79899 Other long term (current) drug therapy: Secondary | ICD-10-CM | POA: Diagnosis not present

## 2019-05-02 DIAGNOSIS — R0789 Other chest pain: Secondary | ICD-10-CM

## 2019-05-02 DIAGNOSIS — R400 Somnolence: Secondary | ICD-10-CM

## 2019-05-02 DIAGNOSIS — Z515 Encounter for palliative care: Secondary | ICD-10-CM | POA: Diagnosis not present

## 2019-05-02 DIAGNOSIS — K219 Gastro-esophageal reflux disease without esophagitis: Secondary | ICD-10-CM

## 2019-05-02 DIAGNOSIS — Z95828 Presence of other vascular implants and grafts: Secondary | ICD-10-CM

## 2019-05-02 DIAGNOSIS — K521 Toxic gastroenteritis and colitis: Secondary | ICD-10-CM

## 2019-05-02 DIAGNOSIS — Z510 Encounter for antineoplastic radiation therapy: Secondary | ICD-10-CM | POA: Diagnosis not present

## 2019-05-02 DIAGNOSIS — Z5111 Encounter for antineoplastic chemotherapy: Secondary | ICD-10-CM | POA: Diagnosis not present

## 2019-05-02 LAB — CBC WITH DIFFERENTIAL/PLATELET
Abs Immature Granulocytes: 0.03 10*3/uL (ref 0.00–0.07)
Basophils Absolute: 0 10*3/uL (ref 0.0–0.1)
Basophils Relative: 1 %
Eosinophils Absolute: 0 10*3/uL (ref 0.0–0.5)
Eosinophils Relative: 1 %
HCT: 28.5 % — ABNORMAL LOW (ref 36.0–46.0)
Hemoglobin: 9 g/dL — ABNORMAL LOW (ref 12.0–15.0)
Immature Granulocytes: 1 %
Lymphocytes Relative: 12 %
Lymphs Abs: 0.3 10*3/uL — ABNORMAL LOW (ref 0.7–4.0)
MCH: 25.7 pg — ABNORMAL LOW (ref 26.0–34.0)
MCHC: 31.6 g/dL (ref 30.0–36.0)
MCV: 81.4 fL (ref 80.0–100.0)
Monocytes Absolute: 0.4 10*3/uL (ref 0.1–1.0)
Monocytes Relative: 13 %
Neutro Abs: 2.1 10*3/uL (ref 1.7–7.7)
Neutrophils Relative %: 72 %
Platelets: 158 10*3/uL (ref 150–400)
RBC: 3.5 MIL/uL — ABNORMAL LOW (ref 3.87–5.11)
RDW: 14.6 % (ref 11.5–15.5)
WBC: 2.8 10*3/uL — ABNORMAL LOW (ref 4.0–10.5)
nRBC: 0 % (ref 0.0–0.2)

## 2019-05-02 LAB — COMPREHENSIVE METABOLIC PANEL
ALT: 17 U/L (ref 0–44)
AST: 15 U/L (ref 15–41)
Albumin: 3.2 g/dL — ABNORMAL LOW (ref 3.5–5.0)
Alkaline Phosphatase: 66 U/L (ref 38–126)
Anion gap: 10 (ref 5–15)
BUN: 7 mg/dL (ref 6–20)
CO2: 28 mmol/L (ref 22–32)
Calcium: 8.7 mg/dL — ABNORMAL LOW (ref 8.9–10.3)
Chloride: 98 mmol/L (ref 98–111)
Creatinine, Ser: 0.43 mg/dL — ABNORMAL LOW (ref 0.44–1.00)
GFR calc Af Amer: >60 mL/min (ref >60)
GFR calc non Af Amer: >60 mL/min (ref >60)
Glucose, Bld: 107 mg/dL — ABNORMAL HIGH (ref 70–99)
Potassium: 4.1 mmol/L (ref 3.5–5.1)
Sodium: 136 mmol/L (ref 135–145)
Total Bilirubin: 0.8 mg/dL (ref 0.3–1.2)
Total Protein: 7.1 g/dL (ref 6.5–8.1)

## 2019-05-02 LAB — SURGICAL PATHOLOGY

## 2019-05-02 MED ORDER — SODIUM CHLORIDE 0.9 % IV SOLN
20.0000 mg | Freq: Once | INTRAVENOUS | Status: AC
  Administered 2019-05-02: 20 mg via INTRAVENOUS
  Filled 2019-05-02: qty 2

## 2019-05-02 MED ORDER — PALONOSETRON HCL INJECTION 0.25 MG/5ML
0.2500 mg | Freq: Once | INTRAVENOUS | Status: AC
  Administered 2019-05-02: 0.25 mg via INTRAVENOUS
  Filled 2019-05-02: qty 5

## 2019-05-02 MED ORDER — SODIUM CHLORIDE 0.9% FLUSH
10.0000 mL | Freq: Once | INTRAVENOUS | Status: AC
  Administered 2019-05-02: 10 mL via INTRAVENOUS
  Filled 2019-05-02: qty 10

## 2019-05-02 MED ORDER — SODIUM CHLORIDE 0.9 % IV SOLN
265.8000 mg | Freq: Once | INTRAVENOUS | Status: AC
  Administered 2019-05-02: 13:00:00 270 mg via INTRAVENOUS
  Filled 2019-05-02: qty 27

## 2019-05-02 MED ORDER — HEPARIN SOD (PORK) LOCK FLUSH 100 UNIT/ML IV SOLN
500.0000 [IU] | Freq: Once | INTRAVENOUS | Status: AC
  Administered 2019-05-02: 14:00:00 500 [IU] via INTRAVENOUS
  Filled 2019-05-02: qty 5

## 2019-05-02 MED ORDER — DIPHENHYDRAMINE HCL 50 MG/ML IJ SOLN
50.0000 mg | Freq: Once | INTRAMUSCULAR | Status: AC
  Administered 2019-05-02: 50 mg via INTRAVENOUS
  Filled 2019-05-02: qty 1

## 2019-05-02 MED ORDER — SODIUM CHLORIDE 0.9 % IV SOLN
45.0000 mg/m2 | Freq: Once | INTRAVENOUS | Status: AC
  Administered 2019-05-02: 90 mg via INTRAVENOUS
  Filled 2019-05-02: qty 15

## 2019-05-02 MED ORDER — FAMOTIDINE IN NACL 20-0.9 MG/50ML-% IV SOLN
20.0000 mg | Freq: Once | INTRAVENOUS | Status: AC
  Administered 2019-05-02: 11:00:00 20 mg via INTRAVENOUS
  Filled 2019-05-02: qty 50

## 2019-05-02 MED ORDER — SODIUM CHLORIDE 0.9 % IV SOLN
Freq: Once | INTRAVENOUS | Status: AC
  Administered 2019-05-02: 11:00:00 via INTRAVENOUS
  Filled 2019-05-02: qty 250

## 2019-05-02 NOTE — Progress Notes (Signed)
Evaluated for fever by [facility] via telephone yesterday and was sent for chest xray.  Left clavicle pain 5/10 today has not taken any pain medication today.  Reports her appetite is "so so" and has lost 5 pounds in the last 4 days.

## 2019-05-02 NOTE — Progress Notes (Signed)
+[place] OFFICE PROGRESS NOTE  Patient Care Team: Valerie Roys, DO as PCP - General (Family Medicine) Anabel Bene, MD as Referring Physician (Neurology) Telford Nab, RN as Registered Nurse  Cancer Staging No matching staging information was found for the patient.   Oncology History Overview Note  # 2011-laryngeal cancer/SCC; Stage II; April-June 2011 s/p  CRT [Dr.Choski/Bennett]  # 2019- likely STAGE I 10 mm RLL nodule [incidental]; s/p wedge resection-July 2019-stage I squamous cell lung cancer. NO  Adjuvant therapy.  # May 28th 2020-stage III lung cancer-recurrent  RIGHT HILAR-- POSITIVE FOR MALIGNANT NEOPLASTIC CELLS. - CONSISTENT WITH METASTATIC SQUAMOUS CELL CARCINOMA.  #June 2020- Carbo-taxol- with RT  # COPD  # post thoracotomy syndrome  # [place]/EGD [Dr.Anna]- July NEG  DIAGNOSIS: RLL lung ca  STAGE:  III ; GOALS: cure  CURRENT/MOST RECENT THERAPY: Carbo-taxol- RT planned   Laryngeal cancer ([place])  Primary cancer of right lower lobe of lung ([place])  05/27/2018 Initial Diagnosis   Primary cancer of right lower lobe of lung ([place])   04/17/2019 -  Chemotherapy   The patient had palonosetron (ALOXI) injection 0.25 mg, 0.25 mg, Intravenous,  Once, 3 of 8 cycles Administration: 0.25 mg (04/17/2019), 0.25 mg (04/24/2019) CARBOplatin (PARAPLATIN) 270 mg in sodium chloride 0.9 % 250 mL chemo infusion, 270 mg (100 % of original dose 265.8 mg), Intravenous,  Once, 3 of 8 cycles Dose modification:   (original dose 265.8 mg, Cycle 1) Administration: 270 mg (04/17/2019), 270 mg (04/24/2019) PACLitaxel (TAXOL) 90 mg in sodium chloride 0.9 % 250 mL chemo infusion (</= 80mg /m2), 45 mg/m2 = 90 mg, Intravenous,  Once, 3 of 8 cycles Administration: 90 mg (04/17/2019), 90 mg (04/24/2019)  for chemotherapy treatment.      INTERVAL HISTORY:  Susan [place] 58 y.o.  female recurrent stage III squamous lung cancer -currently on carbotaxol with radiation.  Patient had  episode of nausea vomiting post chemotherapy-improved with IV fluids/symptom management visit.  Patient's chemotherapy was held yesterday because of low-grade fever of 100.4.  However no further episodes.  Chest x-ray/infectious work-up negative.  Denies any fevers today.  Continues to have intermittent right chest wall pain left side.  Currently on Percocet stable.  No significant difficulty breathing.  No swelling in the legs.  Denies any back pain. Review of Systems  Constitutional: Positive for malaise/fatigue. Negative for chills, diaphoresis, fever and weight loss.  HENT: Negative for nosebleeds and sore throat.   Eyes: Negative for double vision.  Respiratory: Positive for cough and shortness of breath. Negative for hemoptysis, sputum production and wheezing.   Cardiovascular: Positive for chest pain. Negative for palpitations, orthopnea and leg swelling.  Gastrointestinal: Positive for constipation and diarrhea. Negative for abdominal pain, blood in stool, heartburn, melena, nausea and vomiting.  Genitourinary: Negative for dysuria, frequency and urgency.  Musculoskeletal: Negative for back pain and joint pain.  Skin: Negative.  Negative for itching and rash.  Neurological: Negative for dizziness, tingling, focal weakness, weakness and headaches.  Endo/Heme/Allergies: Does not bruise/bleed easily.  Psychiatric/Behavioral: Negative for depression. The patient is nervous/anxious and has insomnia.       PAST MEDICAL HISTORY :  Past Medical History:  Diagnosis Date  . Anemia    vitamin b12 deficiency. no longer taking supplements  . Anxiety   . Arthritis   . Asthma   . Back pain   . Bruises easily   . Chronic leg pain    BILATERAL  . COPD (chronic obstructive pulmonary disease) (  [place])    no inhalers for over 1 year  . Depression   . Difficulty sleeping   . Diverticulitis   . Dyspnea    due to having part of lung removed in 2019  . Dysrhythmia 04/2018   brady episodes.  cleared by dr. Clayborn Bigness  . Facet syndrome, lumbar 03/28/2015  . GERD (gastroesophageal reflux disease)   . Hemorrhoids   . History of kidney stones 04/2018   recently passed stone  . Hyperlipidemia   . Incontinence of urine   . Laryngeal cancer ([place]) 2009   LARYNX CANCER - CHEMO / RADIATION (NO SURGERY)   . Migraine   . Migraine without aura and without status migrainosus, not intractable 09/11/2016  . Mild cognitive impairment 11/23/2017  . MRSA infection greater than 3 months ago    2008 right side of face  . Nerve damage    right leg. thinks this is from her TKR  . Nodule of lower lobe of right lung 04/22/2018  . Oxygen deficiency    2L/HS  . Primary cancer of right lower lobe of lung ([place]) 04/18/2018   Surgical resection of RLL  . Wears dentures    full upper and lower    PAST SURGICAL HISTORY :   Past Surgical History:  Procedure Laterality Date  . ABDOMINAL HYSTERECTOMY  1983  . BLADDER SUSPENSION  2014  . CATARACT EXTRACTION W/PHACO Left 05/11/2018   Procedure: CATARACT EXTRACTION PHACO AND INTRAOCULAR LENS PLACEMENT ([place]) LEFT;  Surgeon: Leandrew Koyanagi, MD;  Location: [place];  Service: Ophthalmology;  Laterality: Left;  PREFERS EARLY  . CATARACT EXTRACTION W/PHACO Right 08/11/2018   Procedure: CATARACT EXTRACTION PHACO AND INTRAOCULAR LENS PLACEMENT ([facility]);  Surgeon: Leandrew Koyanagi, MD;  Location: [facility];  Service: Ophthalmology;  Laterality: Right;  CDE 5:02 Total Time 01:35 Fluid lot # [id number]  . CHOLECYSTECTOMY  1998  . COLONOSCOPY    . COLONOSCOPY WITH PROPOFOL N/A 04/28/2019   Procedure: COLONOSCOPY WITH PROPOFOL;  Surgeon: Jonathon Bellows, MD;  Location: [facility];  Service: Gastroenterology;  Laterality: N/A;  . ENDOBRONCHIAL ULTRASOUND N/A 03/20/2019   Procedure: ENDOBRONCHIAL ULTRASOUND;  Surgeon: Flora Lipps, MD;  Location: [facility];  Service: Cardiopulmonary;  Laterality: N/A;  . ESOPHAGOGASTRODUODENOSCOPY  2015  .  ESOPHAGOGASTRODUODENOSCOPY (EGD) WITH PROPOFOL N/A 11/20/2016   Procedure: ESOPHAGOGASTRODUODENOSCOPY (EGD) WITH PROPOFOL;  Surgeon: Jonathon Bellows, MD;  Location: [facility];  Service: Endoscopy;  Laterality: N/A;  . ESOPHAGOGASTRODUODENOSCOPY (EGD) WITH PROPOFOL N/A 04/28/2019   Procedure: ESOPHAGOGASTRODUODENOSCOPY (EGD) WITH PROPOFOL;  Surgeon: Jonathon Bellows, MD;  Location: [facility];  Service: Gastroenterology;  Laterality: N/A;  Pt needs appointment after 9:30am  . EXCISION NEUROMA Bilateral 09/24/2014   Procedure: BILATERAL OPEN SAPHENOUS NEURECTOMIES AND OPEN LEFT PERIPATELLA OSETOPHYTECTOMY;  Surgeon: Mauri Pole, MD;  Location: [facility];  Service: Orthopedics;  Laterality: Bilateral;  . EYE SURGERY Left 05/11/2018   cataract extraction  . JOINT REPLACEMENT  2009/2010   BIL TOTAL KNEES  . JOINT REPLACEMENT  2020   left  . PORTACATH PLACEMENT N/A 04/05/2019   Procedure: INSERTION PORT-A-CATH;  Surgeon: Nestor Lewandowsky, MD;  Location: [facility];  Service: General;  Laterality: N/A;  . THORACOTOMY/LOBECTOMY Right 05/16/2018   Procedure: THORACOTOMY/POSSIBLE LOBECTOMY;  Surgeon: Nestor Lewandowsky, MD;  Location: [facility];  Service: Thoracic;  Laterality: Right;  . TONSILLECTOMY    . TOTAL KNEE REVISION Left 11/29/2018   Procedure: Left TOTAL KNEE REVISION;  Surgeon: Hessie Knows, MD;  Location: [facility];  Service: Orthopedics;  Laterality: Left;  Marland Kitchen VIDEO BRONCHOSCOPY N/A 05/16/2018   Procedure: PREOP  BRONCHOSCOPY;  Surgeon: Nestor Lewandowsky, MD;  Location: [facility];  Service: Thoracic;  Laterality: N/A;    FAMILY HISTORY :   Family History  Problem Relation Age of Onset  . Asthma Mother   . Diabetes Mother   . Hyperlipidemia Mother   . Hypertension Mother   . Cirrhosis Mother        Non-alcoholic  . Arthritis Father   . Cancer Father        Bone  . Hyperlipidemia Father   . Hypertension Father   . Anxiety disorder Sister   . Depression Sister   . Kidney Stones Son   . Stroke Maternal  Grandmother   . Breast cancer Maternal Grandmother 74  . Cancer Maternal Grandfather        lung  . Pneumonia Paternal Grandmother   . Alzheimer's disease Paternal Grandfather   . Diabetes Sister   . Gout Sister   . Hypertension Sister   . Alcohol abuse Brother   . Heart disease Brother        massive MI    SOCIAL HISTORY:   Social History   Tobacco Use  . Smoking status: Former Smoker    Packs/day: 0.50    Types: Cigarettes    Quit date: 04/07/2016    Years since quitting: 3.0  . Smokeless tobacco: Never Used  . Tobacco comment: patient has not smoked x 10 days.06/28/17 chantix  Substance Use Topics  . Alcohol use: No    Alcohol/week: 0.0 standard drinks  . Drug use: No    ALLERGIES:  is allergic to morphine and related; cymbalta [duloxetine hcl]; adhesive [tape]; and lexapro [escitalopram oxalate].  MEDICATIONS:  Current Outpatient Medications  Medication Sig Dispense Refill  . albuterol (PROVENTIL HFA;VENTOLIN HFA) 108 (90 Base) MCG/ACT inhaler Inhale 2 puffs into the lungs every 4 (four) hours as needed for wheezing or shortness of breath. 1 Inhaler 2  . albuterol (PROVENTIL) (2.5 MG/3ML) 0.083% nebulizer solution Take 3 mLs (2.5 mg total) by nebulization every 4 (four) hours as needed for wheezing or shortness of breath. 75 mL 12  . aspirin EC 81 MG tablet Take 81 mg by mouth daily.    Marland Kitchen aspirin-acetaminophen-caffeine (EXCEDRIN MIGRAINE) 250-250-65 MG tablet Take 1 tablet by mouth daily as needed for headache.    . clonazePAM (KLONOPIN) 0.5 MG tablet Take 0.5-1 tablets (0.25-0.5 mg total) by mouth daily as needed for anxiety. Try to limit use and use only for severe anxiety and sleep problems 25 tablet 1  . estradiol (ESTRACE) 1 MG tablet Take 1 mg by mouth daily.     Marland Kitchen FLUoxetine (PROZAC) 40 MG capsule Take 1 capsule (40 mg total) by mouth daily. To be combined with 20 mg 90 capsule 1  . guaiFENesin-codeine 100-10 MG/5ML syrup Take 10 mLs by mouth 3 (three) times daily  as needed for cough. 120 mL 0  . hydrOXYzine (VISTARIL) 25 MG capsule TAKE 1 CAPSULE(25 MG) BY MOUTH TWICE DAILY AS NEEDED FOR SEVERE ANXIETY OR SYMPTOMS 60 capsule 1  . lidocaine-prilocaine (EMLA) cream Apply to affected area once 30 g 3  . loperamide (IMODIUM) 2 MG capsule Take 2 mg by mouth as needed for diarrhea or loose stools.    . magic mouthwash SOLN Take 5 mLs by mouth 3 (three) times daily as needed for mouth pain. 240 mL 0  . nystatin cream (MYCOSTATIN) Apply 1 application topically 2 (two) times  daily. 30 g 0  . omeprazole (PRILOSEC) 40 MG capsule Take 1 capsule (40 mg total) by mouth daily. (Patient taking differently: Take 40 mg by mouth every morning. ) 90 capsule 1  . ondansetron (ZOFRAN) 8 MG tablet Take 1 tablet (8 mg total) by mouth 2 (two) times daily as needed for refractory nausea / vomiting. Start on day 3 after chemo. 30 tablet 1  . oxyCODONE-acetaminophen (PERCOCET) 5-325 MG tablet Take 1 tablet by mouth every 8 (eight) hours as needed for severe pain. 45 tablet 0  . OXYGEN Inhale 2 L into the lungs at bedtime.    . pregabalin (LYRICA) 50 MG capsule Take 1 capsule (50 mg total) by mouth 3 (three) times daily. 90 capsule 2  . prochlorperazine (COMPAZINE) 10 MG tablet Take 1 tablet (10 mg total) by mouth every 6 (six) hours as needed (Nausea or vomiting). 30 tablet 1  . sucralfate (CARAFATE) 1 g tablet Take 1 tablet (1 g total) by mouth 3 (three) times daily. Dissolve in 3-4 tbsp warm water, swish and swallow. 90 tablet 3  . Suvorexant (BELSOMRA) 20 MG TABS Take 20 mg by mouth at bedtime. 30 tablet 2  . traMADol (ULTRAM) 50 MG tablet Take 1 tablet (50 mg total) by mouth every 8 (eight) hours as needed. 90 tablet 0  . FLUoxetine (PROZAC) 20 MG capsule Take 1 capsule (20 mg total) by mouth daily. To be combined with 40 mg (Patient not taking: Reported on 05/02/2019) 90 capsule 1   No current facility-administered medications for this visit.    Facility-Administered Medications  Ordered in Other Visits  Medication Dose Route Frequency Provider Last Rate Last Dose  . CARBOplatin (PARAPLATIN) 270 mg in sodium chloride 0.9 % 250 mL chemo infusion  270 mg Intravenous Once Charlaine Dalton R, MD      . heparin lock flush 100 unit/mL  500 Units Intravenous Once Charlaine Dalton R, MD      . ipratropium-albuterol (DUONEB) 0.5-2.5 (3) MG/3ML nebulizer solution 3 mL  3 mL Nebulization Q6H Burns, Jennifer E, NP      . PACLitaxel (TAXOL) 90 mg in sodium chloride 0.9 % 250 mL chemo infusion (</= 80mg /m2)  45 mg/m2 (Treatment Plan Recorded) Intravenous Once Cammie Sickle, MD 265 mL/hr at 05/02/19 1158 90 mg at 05/02/19 1158    PHYSICAL EXAMINATION: ECOG PERFORMANCE STATUS: 1 - Symptomatic but completely ambulatory  BP 102/71   Pulse 76   Temp 98.7 F (37.1 C)   Resp 18   Wt 195 lb 9.6 oz (88.7 kg)   SpO2 96%   BMI 35.78 kg/m   Filed Weights   05/02/19 0954  Weight: 195 lb 9.6 oz (88.7 kg)    Physical Exam  Constitutional: She is oriented to person, place, and time and well-developed, well-nourished, and in no distress.  She is alone.  She is walking herself.  Not on oxygen.  HENT:  Head: Normocephalic and atraumatic.  Mouth/Throat: Oropharynx is clear and moist. No oropharyngeal exudate.  Eyes: Pupils are equal, round, and reactive to light.  Neck: Normal range of motion. Neck supple.  Cardiovascular: Normal rate and regular rhythm.  Pulmonary/Chest: No respiratory distress. She has no wheezes.  Right anterior chest wall-tenderness.  No rash.  Abdominal: Soft. Bowel sounds are normal. She exhibits no distension and no mass. There is no abdominal tenderness. There is no rebound and no guarding.  Musculoskeletal: Normal range of motion.        General: No tenderness  or edema.  Neurological: She is alert and oriented to person, place, and time.  Skin: Skin is warm.  Psychiatric: Affect normal.       LABORATORY DATA:  I have reviewed the data as  listed    Component Value Date/Time   NA 136 05/02/2019 0930   NA 137 12/03/2017 1447   NA 137 05/14/2014 1457   K 4.1 05/02/2019 0930   K 3.2 (L) 05/14/2014 1457   CL 98 05/02/2019 0930   CL 103 05/14/2014 1457   CO2 28 05/02/2019 0930   CO2 28 05/14/2014 1457   GLUCOSE 107 (H) 05/02/2019 0930   GLUCOSE 80 05/14/2014 1457   BUN 7 05/02/2019 0930   BUN 8 12/03/2017 1447   BUN 8 05/14/2014 1457   CREATININE 0.43 (L) 05/02/2019 0930   CREATININE 0.79 05/14/2014 1457   CALCIUM 8.7 (L) 05/02/2019 0930   CALCIUM 8.9 05/14/2014 1457   PROT 7.1 05/02/2019 0930   PROT 6.8 12/03/2017 1447   PROT 7.0 05/14/2014 1457   ALBUMIN 3.2 (L) 05/02/2019 0930   ALBUMIN 3.8 12/03/2017 1447   ALBUMIN 3.3 (L) 05/14/2014 1457   AST 15 05/02/2019 0930   AST 9 (L) 05/14/2014 1457   ALT 17 05/02/2019 0930   ALT 15 05/14/2014 1457   ALKPHOS 66 05/02/2019 0930   ALKPHOS 87 05/14/2014 1457   BILITOT 0.8 05/02/2019 0930   BILITOT 0.6 12/03/2017 1447   BILITOT 0.2 05/14/2014 1457   GFRNONAA >60 05/02/2019 0930   GFRNONAA >60 05/14/2014 1457   GFRAA >60 05/02/2019 0930   GFRAA >60 05/14/2014 1457    No results found for: SPEP, UPEP  Lab Results  Component Value Date   WBC 2.8 (L) 05/02/2019   NEUTROABS 2.1 05/02/2019   HGB 9.0 (L) 05/02/2019   HCT 28.5 (L) 05/02/2019   MCV 81.4 05/02/2019   PLT 158 05/02/2019      Chemistry      Component Value Date/Time   NA 136 05/02/2019 0930   NA 137 12/03/2017 1447   NA 137 05/14/2014 1457   K 4.1 05/02/2019 0930   K 3.2 (L) 05/14/2014 1457   CL 98 05/02/2019 0930   CL 103 05/14/2014 1457   CO2 28 05/02/2019 0930   CO2 28 05/14/2014 1457   BUN 7 05/02/2019 0930   BUN 8 12/03/2017 1447   BUN 8 05/14/2014 1457   CREATININE 0.43 (L) 05/02/2019 0930   CREATININE 0.79 05/14/2014 1457      Component Value Date/Time   CALCIUM 8.7 (L) 05/02/2019 0930   CALCIUM 8.9 05/14/2014 1457   ALKPHOS 66 05/02/2019 0930   ALKPHOS 87 05/14/2014 1457   AST  15 05/02/2019 0930   AST 9 (L) 05/14/2014 1457   ALT 17 05/02/2019 0930   ALT 15 05/14/2014 1457   BILITOT 0.8 05/02/2019 0930   BILITOT 0.6 12/03/2017 1447   BILITOT 0.2 05/14/2014 1457       RADIOGRAPHIC STUDIES: I have personally reviewed the radiological images as listed and agreed with the findings in the report. Dg Chest 2 View  Result Date: 05/01/2019 CLINICAL DATA:  LEFT upper chest pain with cough and shortness of breath since yesterday. History of partial lung resection in June. Former smoker. History of laryngeal cancer and asthma. EXAM: CHEST - 2 VIEW COMPARISON:  Chest x-ray dated 04/05/2019. Chest x-ray dated 11/12/2018. FINDINGS: Heart size and mediastinal contours are within normal limits. LEFT chest wall Port-A-Cath remains adequately positioned with tip at the  level of the mid SVC. Surgical sutures are again noted at the RIGHT lung base, compatible with the given history of partial lung resection. Lungs otherwise clear. No pleural effusions seen. No pneumothorax seen. Osseous structures about the chest are unremarkable. IMPRESSION: No active cardiopulmonary disease. No evidence of pneumonia or pulmonary edema. Electronically Signed   By: Franki Cabot M.D.   On: 05/01/2019 17:14     ASSESSMENT & PLAN:  Primary cancer of right lower lobe of lung ([place]) #Recurrent squamous cell carcinoma status post biopsy of right hilar lymph node-stage III; currently on  carbotaxol cycle #1 weekly; along with radiation.    # proceed with # 3- carbo-Taxol. Labs today reviewed;  acceptable for treatment today.   # low grade tem- yesterday- resolved.  No further episodes.  Chest x-ray normal.  # nausea/vomitting- sec to chemo/needed fluids.continue anti-emetics as needed at home.   # right chest wall pain- pleuritic; likely musculoskeletal. on Percocet every 8 hours.  Stable  # COPD-stable.  Continue inhalers at home.  # insominia/anxiety- on trazadone/ seroqul; klonipin--stable  #  DISPOSITION:  # Treatment today # 1 week- carbo-Taxol/cbc/bmp # 2 weeks- MD- cbc/cmp;carbo-taxol- Dr.B    Orders Placed This Encounter  Procedures  . CBC with Differential    Standing Status:   Future    Standing Expiration Date:   05/01/2020  . Basic metabolic panel    Standing Status:   Future    Standing Expiration Date:   05/01/2020  . CBC with Differential    Standing Status:   Future    Standing Expiration Date:   05/01/2020  . Comprehensive metabolic panel    Standing Status:   Future    Standing Expiration Date:   05/01/2020   All questions were answered. The patient knows to call the clinic with any problems, questions or concerns.      Cammie Sickle, MD 05/02/2019 12:41 PM

## 2019-05-02 NOTE — Assessment & Plan Note (Addendum)
#  Recurrent squamous cell carcinoma status post biopsy of right hilar lymph node-stage III; currently on  carbotaxol cycle #1 weekly; along with radiation.    # proceed with # 3- carbo-Taxol. Labs today reviewed;  acceptable for treatment today.   # low grade tem- yesterday- resolved.  No further episodes.  Chest x-ray normal.  # nausea/vomitting- sec to chemo/needed fluids.continue anti-emetics as needed at home.   # right chest wall pain- pleuritic; likely musculoskeletal. on Percocet every 8 hours.  Stable  # COPD-stable.  Continue inhalers at home.  # insominia/anxiety- on trazadone/ seroqul; klonipin--stable  # DISPOSITION:  # Treatment today # 1 week- carbo-Taxol/cbc/bmp # 2 weeks- MD- cbc/cmp;carbo-taxol- Dr.B

## 2019-05-03 ENCOUNTER — Ambulatory Visit
Admission: RE | Admit: 2019-05-03 | Discharge: 2019-05-03 | Disposition: A | Discharge status: Home / Self Care | Payer: Medicare Other | Source: Ambulatory Visit | Attending: Radiation Oncology | Admitting: Radiation Oncology

## 2019-05-03 ENCOUNTER — Telehealth: Payer: Self-pay

## 2019-05-03 ENCOUNTER — Other Ambulatory Visit: Payer: Self-pay

## 2019-05-03 DIAGNOSIS — Z510 Encounter for antineoplastic radiation therapy: Secondary | ICD-10-CM | POA: Diagnosis not present

## 2019-05-03 DIAGNOSIS — C3431 Malignant neoplasm of lower lobe, right bronchus or lung: Secondary | ICD-10-CM | POA: Diagnosis not present

## 2019-05-03 DIAGNOSIS — Z87891 Personal history of nicotine dependence: Secondary | ICD-10-CM | POA: Diagnosis not present

## 2019-05-03 DIAGNOSIS — C771 Secondary and unspecified malignant neoplasm of intrathoracic lymph nodes: Secondary | ICD-10-CM | POA: Diagnosis not present

## 2019-05-03 NOTE — Telephone Encounter (Signed)
If she is interested in Trazodone, I can prescribe the medication. Will not plan to prescribe other meds given the medication regimen she is on.

## 2019-05-03 NOTE — Progress Notes (Signed)
Susan Houston : inform patient- some abnormal cells seen in the EGD on random biopsy- suggest we repeat EGD and take more samples and have a better look to get more information . C/c Johnson, Megan P, DO , Dr Rogue Bussing

## 2019-05-03 NOTE — Telephone Encounter (Signed)
pt called states she needs something to help with sleep or have medication increased. she states she not sleeping at all.

## 2019-05-04 ENCOUNTER — Ambulatory Visit
Admission: RE | Admit: 2019-05-04 | Discharge: 2019-05-04 | Disposition: A | Discharge status: Home / Self Care | Payer: Medicare Other | Source: Ambulatory Visit | Attending: Radiation Oncology | Admitting: Radiation Oncology

## 2019-05-04 ENCOUNTER — Other Ambulatory Visit: Payer: Self-pay

## 2019-05-04 DIAGNOSIS — C771 Secondary and unspecified malignant neoplasm of intrathoracic lymph nodes: Secondary | ICD-10-CM | POA: Diagnosis not present

## 2019-05-04 DIAGNOSIS — C3431 Malignant neoplasm of lower lobe, right bronchus or lung: Secondary | ICD-10-CM | POA: Diagnosis not present

## 2019-05-04 DIAGNOSIS — Z510 Encounter for antineoplastic radiation therapy: Secondary | ICD-10-CM | POA: Diagnosis not present

## 2019-05-04 DIAGNOSIS — Z87891 Personal history of nicotine dependence: Secondary | ICD-10-CM | POA: Diagnosis not present

## 2019-05-04 NOTE — Telephone Encounter (Signed)
pt returned my call she states that she can not take trazodone asked if she can try valium

## 2019-05-04 NOTE — Telephone Encounter (Signed)
No as she is on clonazepam as well. Please provide psychoeducation to her to improve sleep hygiene (exercise, limit blue light, etc).

## 2019-05-05 ENCOUNTER — Other Ambulatory Visit: Payer: Self-pay

## 2019-05-05 ENCOUNTER — Ambulatory Visit
Admission: RE | Admit: 2019-05-05 | Discharge: 2019-05-05 | Disposition: A | Discharge status: Home / Self Care | Payer: Medicare Other | Source: Ambulatory Visit | Attending: Radiation Oncology | Admitting: Radiation Oncology

## 2019-05-05 DIAGNOSIS — Z87891 Personal history of nicotine dependence: Secondary | ICD-10-CM | POA: Diagnosis not present

## 2019-05-05 DIAGNOSIS — C3431 Malignant neoplasm of lower lobe, right bronchus or lung: Secondary | ICD-10-CM | POA: Diagnosis not present

## 2019-05-05 DIAGNOSIS — C771 Secondary and unspecified malignant neoplasm of intrathoracic lymph nodes: Secondary | ICD-10-CM | POA: Diagnosis not present

## 2019-05-05 DIAGNOSIS — Z510 Encounter for antineoplastic radiation therapy: Secondary | ICD-10-CM | POA: Diagnosis not present

## 2019-05-08 ENCOUNTER — Other Ambulatory Visit: Payer: Self-pay

## 2019-05-08 ENCOUNTER — Ambulatory Visit
Admission: RE | Admit: 2019-05-08 | Discharge: 2019-05-08 | Disposition: A | Discharge status: Home / Self Care | Payer: Medicare Other | Source: Ambulatory Visit | Attending: Internal Medicine | Admitting: Internal Medicine

## 2019-05-08 ENCOUNTER — Ambulatory Visit
Admission: RE | Admit: 2019-05-08 | Discharge: 2019-05-08 | Disposition: A | Discharge status: Home / Self Care | Payer: Medicare Other | Source: Ambulatory Visit | Attending: Radiation Oncology | Admitting: Radiation Oncology

## 2019-05-08 DIAGNOSIS — C3431 Malignant neoplasm of lower lobe, right bronchus or lung: Secondary | ICD-10-CM

## 2019-05-08 DIAGNOSIS — Z87891 Personal history of nicotine dependence: Secondary | ICD-10-CM | POA: Diagnosis not present

## 2019-05-08 DIAGNOSIS — C771 Secondary and unspecified malignant neoplasm of intrathoracic lymph nodes: Secondary | ICD-10-CM | POA: Diagnosis not present

## 2019-05-08 DIAGNOSIS — R0602 Shortness of breath: Secondary | ICD-10-CM | POA: Diagnosis not present

## 2019-05-08 DIAGNOSIS — C3411 Malignant neoplasm of upper lobe, right bronchus or lung: Secondary | ICD-10-CM | POA: Diagnosis not present

## 2019-05-08 DIAGNOSIS — Z510 Encounter for antineoplastic radiation therapy: Secondary | ICD-10-CM | POA: Diagnosis not present

## 2019-05-08 MED ORDER — IOHEXOL 300 MG/ML  SOLN
75.0000 mL | Freq: Once | INTRAMUSCULAR | Status: AC | PRN
  Administered 2019-05-08: 10:00:00 75 mL via INTRAVENOUS

## 2019-05-08 NOTE — Telephone Encounter (Signed)
Message left for patient and mailed patient information about sleep hygiene.

## 2019-05-09 ENCOUNTER — Other Ambulatory Visit: Payer: Medicare HMO

## 2019-05-09 ENCOUNTER — Telehealth: Payer: Self-pay

## 2019-05-09 ENCOUNTER — Ambulatory Visit: Payer: Medicare HMO | Admitting: Internal Medicine

## 2019-05-09 ENCOUNTER — Ambulatory Visit: Payer: Medicare Other

## 2019-05-09 NOTE — Telephone Encounter (Signed)
-----   Message from Jonathon Bellows, Susan Houston sent at 05/04/2019 11:45 AM EDT ----- 2 weeks   Susan Houston  ----- Message ----- From: Susan Houston, Susan Houston Sent: 05/04/2019  11:00 AM EDT To: Jonathon Bellows, Susan Houston  Dr. Vicente [place], how soon Susan Houston we need to schedule the repeat EGD?  Susan Houston ----- Message ----- From: Jonathon Bellows, Susan Houston Sent: 05/03/2019   8:47 AM EDT To: Susan Roys, Susan [place], Susan Sickle, Susan Houston, #  Susan Houston : inform patient- some abnormal cells seen in the EGD on random biopsy- suggest we repeat EGD and take more samples and have a better look to get more information . C/c Johnson, Megan P, Susan Houston , Dr Rogue Bussing

## 2019-05-09 NOTE — Telephone Encounter (Signed)
Called pt to inform her of biopsy results and Dr. Georgeann Oppenheim instructions to repeat the upper endoscopy procedure in 2-3 weeks.  Unable to contact, LVM to return call

## 2019-05-09 NOTE — Telephone Encounter (Signed)
Patient returning call to [facility] for results.

## 2019-05-10 ENCOUNTER — Ambulatory Visit
Admission: RE | Admit: 2019-05-10 | Discharge: 2019-05-10 | Disposition: A | Discharge status: Home / Self Care | Payer: Medicare Other | Source: Ambulatory Visit | Attending: Radiation Oncology | Admitting: Radiation Oncology

## 2019-05-10 ENCOUNTER — Inpatient Hospital Stay: Payer: Medicare Other

## 2019-05-10 ENCOUNTER — Other Ambulatory Visit: Payer: Self-pay

## 2019-05-10 VITALS — BP 146/82 | HR 95 | Temp 97.5°F | Resp 18 | Wt 190.2 lb

## 2019-05-10 DIAGNOSIS — Z510 Encounter for antineoplastic radiation therapy: Secondary | ICD-10-CM | POA: Diagnosis not present

## 2019-05-10 DIAGNOSIS — C3431 Malignant neoplasm of lower lobe, right bronchus or lung: Secondary | ICD-10-CM

## 2019-05-10 DIAGNOSIS — Z515 Encounter for palliative care: Secondary | ICD-10-CM | POA: Diagnosis not present

## 2019-05-10 DIAGNOSIS — Z8521 Personal history of malignant neoplasm of larynx: Secondary | ICD-10-CM | POA: Diagnosis not present

## 2019-05-10 DIAGNOSIS — E785 Hyperlipidemia, unspecified: Secondary | ICD-10-CM | POA: Diagnosis not present

## 2019-05-10 DIAGNOSIS — R112 Nausea with vomiting, unspecified: Secondary | ICD-10-CM | POA: Diagnosis not present

## 2019-05-10 DIAGNOSIS — R0789 Other chest pain: Secondary | ICD-10-CM | POA: Diagnosis not present

## 2019-05-10 DIAGNOSIS — J449 Chronic obstructive pulmonary disease, unspecified: Secondary | ICD-10-CM | POA: Diagnosis not present

## 2019-05-10 DIAGNOSIS — Z7982 Long term (current) use of aspirin: Secondary | ICD-10-CM | POA: Diagnosis not present

## 2019-05-10 DIAGNOSIS — R5383 Other fatigue: Secondary | ICD-10-CM | POA: Diagnosis not present

## 2019-05-10 DIAGNOSIS — K521 Toxic gastroenteritis and colitis: Secondary | ICD-10-CM | POA: Diagnosis not present

## 2019-05-10 DIAGNOSIS — C771 Secondary and unspecified malignant neoplasm of intrathoracic lymph nodes: Secondary | ICD-10-CM | POA: Diagnosis not present

## 2019-05-10 DIAGNOSIS — R400 Somnolence: Secondary | ICD-10-CM | POA: Diagnosis not present

## 2019-05-10 DIAGNOSIS — Z5111 Encounter for antineoplastic chemotherapy: Secondary | ICD-10-CM | POA: Diagnosis not present

## 2019-05-10 DIAGNOSIS — K219 Gastro-esophageal reflux disease without esophagitis: Secondary | ICD-10-CM | POA: Diagnosis not present

## 2019-05-10 DIAGNOSIS — Z79899 Other long term (current) drug therapy: Secondary | ICD-10-CM | POA: Diagnosis not present

## 2019-05-10 DIAGNOSIS — Z87891 Personal history of nicotine dependence: Secondary | ICD-10-CM | POA: Diagnosis not present

## 2019-05-10 LAB — CBC WITH DIFFERENTIAL/PLATELET
Abs Immature Granulocytes: 0.03 10*3/uL (ref 0.00–0.07)
Basophils Absolute: 0 10*3/uL (ref 0.0–0.1)
Basophils Relative: 1 %
Eosinophils Absolute: 0 10*3/uL (ref 0.0–0.5)
Eosinophils Relative: 0 %
HCT: 30.9 % — ABNORMAL LOW (ref 36.0–46.0)
Hemoglobin: 9.9 g/dL — ABNORMAL LOW (ref 12.0–15.0)
Immature Granulocytes: 1 %
Lymphocytes Relative: 10 %
Lymphs Abs: 0.4 10*3/uL — ABNORMAL LOW (ref 0.7–4.0)
MCH: 26.1 pg (ref 26.0–34.0)
MCHC: 32 g/dL (ref 30.0–36.0)
MCV: 81.5 fL (ref 80.0–100.0)
Monocytes Absolute: 0.3 10*3/uL (ref 0.1–1.0)
Monocytes Relative: 8 %
Neutro Abs: 3.1 10*3/uL (ref 1.7–7.7)
Neutrophils Relative %: 80 %
Platelets: 196 10*3/uL (ref 150–400)
RBC: 3.79 MIL/uL — ABNORMAL LOW (ref 3.87–5.11)
RDW: 14.7 % (ref 11.5–15.5)
WBC: 3.8 10*3/uL — ABNORMAL LOW (ref 4.0–10.5)
nRBC: 0.5 % — ABNORMAL HIGH (ref 0.0–0.2)

## 2019-05-10 LAB — BASIC METABOLIC PANEL
Anion gap: 11 (ref 5–15)
BUN: 10 mg/dL (ref 6–20)
CO2: 25 mmol/L (ref 22–32)
Calcium: 8.9 mg/dL (ref 8.9–10.3)
Chloride: 99 mmol/L (ref 98–111)
Creatinine, Ser: 0.75 mg/dL (ref 0.44–1.00)
GFR calc Af Amer: >60 mL/min (ref >60)
GFR calc non Af Amer: >60 mL/min (ref >60)
Glucose, Bld: 122 mg/dL — ABNORMAL HIGH (ref 70–99)
Potassium: 3.5 mmol/L (ref 3.5–5.1)
Sodium: 135 mmol/L (ref 135–145)

## 2019-05-10 MED ORDER — HEPARIN SOD (PORK) LOCK FLUSH 100 UNIT/ML IV SOLN
500.0000 [IU] | Freq: Once | INTRAVENOUS | Status: AC
  Administered 2019-05-10: 13:00:00 500 [IU] via INTRAVENOUS
  Filled 2019-05-10: qty 5

## 2019-05-10 MED ORDER — SODIUM CHLORIDE 0.9% FLUSH
10.0000 mL | INTRAVENOUS | Status: DC | PRN
  Administered 2019-05-10: 10 mL via INTRAVENOUS
  Filled 2019-05-10: qty 10

## 2019-05-10 MED ORDER — SODIUM CHLORIDE 0.9 % IV SOLN
Freq: Once | INTRAVENOUS | Status: AC
  Administered 2019-05-10: 10:00:00 via INTRAVENOUS
  Filled 2019-05-10: qty 250

## 2019-05-10 MED ORDER — HEPARIN SOD (PORK) LOCK FLUSH 100 UNIT/ML IV SOLN
INTRAVENOUS | Status: AC
  Filled 2019-05-10: qty 5

## 2019-05-10 MED ORDER — SODIUM CHLORIDE 0.9 % IV SOLN
265.8000 mg | Freq: Once | INTRAVENOUS | Status: AC
  Administered 2019-05-10: 270 mg via INTRAVENOUS
  Filled 2019-05-10: qty 27

## 2019-05-10 MED ORDER — SODIUM CHLORIDE 0.9 % IV SOLN
45.0000 mg/m2 | Freq: Once | INTRAVENOUS | Status: AC
  Administered 2019-05-10: 90 mg via INTRAVENOUS
  Filled 2019-05-10: qty 15

## 2019-05-10 MED ORDER — FAMOTIDINE IN NACL 20-0.9 MG/50ML-% IV SOLN
20.0000 mg | Freq: Once | INTRAVENOUS | Status: AC
  Administered 2019-05-10: 20 mg via INTRAVENOUS
  Filled 2019-05-10: qty 50

## 2019-05-10 MED ORDER — SODIUM CHLORIDE 0.9 % IV SOLN
20.0000 mg | Freq: Once | INTRAVENOUS | Status: AC
  Administered 2019-05-10: 20 mg via INTRAVENOUS
  Filled 2019-05-10: qty 2

## 2019-05-10 MED ORDER — DIPHENHYDRAMINE HCL 50 MG/ML IJ SOLN
50.0000 mg | Freq: Once | INTRAMUSCULAR | Status: AC
  Administered 2019-05-10: 10:00:00 50 mg via INTRAVENOUS
  Filled 2019-05-10: qty 1

## 2019-05-10 MED ORDER — PALONOSETRON HCL INJECTION 0.25 MG/5ML
0.2500 mg | Freq: Once | INTRAVENOUS | Status: AC
  Administered 2019-05-10: 0.25 mg via INTRAVENOUS
  Filled 2019-05-10: qty 5

## 2019-05-11 ENCOUNTER — Ambulatory Visit
Admission: RE | Admit: 2019-05-11 | Discharge: 2019-05-11 | Disposition: A | Discharge status: Home / Self Care | Payer: Medicare Other | Source: Ambulatory Visit | Attending: Radiation Oncology | Admitting: Radiation Oncology

## 2019-05-11 ENCOUNTER — Other Ambulatory Visit: Payer: Self-pay | Admitting: *Deleted

## 2019-05-11 ENCOUNTER — Other Ambulatory Visit: Payer: Self-pay

## 2019-05-11 DIAGNOSIS — C771 Secondary and unspecified malignant neoplasm of intrathoracic lymph nodes: Secondary | ICD-10-CM | POA: Diagnosis not present

## 2019-05-11 DIAGNOSIS — C3431 Malignant neoplasm of lower lobe, right bronchus or lung: Secondary | ICD-10-CM | POA: Diagnosis not present

## 2019-05-11 DIAGNOSIS — K2289 Other specified disease of esophagus: Secondary | ICD-10-CM

## 2019-05-11 DIAGNOSIS — K228 Other specified diseases of esophagus: Secondary | ICD-10-CM

## 2019-05-11 DIAGNOSIS — Z87891 Personal history of nicotine dependence: Secondary | ICD-10-CM | POA: Diagnosis not present

## 2019-05-11 DIAGNOSIS — Z510 Encounter for antineoplastic radiation therapy: Secondary | ICD-10-CM | POA: Diagnosis not present

## 2019-05-11 MED ORDER — GUAIFENESIN-CODEINE 100-10 MG/5ML PO SOLN: 10.0000 mL | Freq: Three times a day (TID) | ORAL | 0 refills | Status: DC | PRN

## 2019-05-11 NOTE — Telephone Encounter (Signed)
Called pt regarding biopsy results and Dr. Georgeann Oppenheim instructions for pt to have repeat upper endoscopy.  Unable to contact, LVM to return call

## 2019-05-11 NOTE — Telephone Encounter (Signed)
Spoke with pt and informed her of biopsy results and Dr. Georgeann Oppenheim instructions to repeat the upper endoscopy. Pt agrees and procedure has been scheduled for 05-26-19.

## 2019-05-11 NOTE — Telephone Encounter (Signed)
Patient calling for results.

## 2019-05-12 ENCOUNTER — Other Ambulatory Visit: Payer: Self-pay

## 2019-05-12 ENCOUNTER — Ambulatory Visit
Admission: RE | Admit: 2019-05-12 | Discharge: 2019-05-12 | Disposition: A | Discharge status: Home / Self Care | Payer: Medicare Other | Source: Ambulatory Visit | Attending: Radiation Oncology | Admitting: Radiation Oncology

## 2019-05-12 DIAGNOSIS — Z87891 Personal history of nicotine dependence: Secondary | ICD-10-CM | POA: Diagnosis not present

## 2019-05-12 DIAGNOSIS — C3431 Malignant neoplasm of lower lobe, right bronchus or lung: Secondary | ICD-10-CM | POA: Diagnosis not present

## 2019-05-12 DIAGNOSIS — C771 Secondary and unspecified malignant neoplasm of intrathoracic lymph nodes: Secondary | ICD-10-CM | POA: Diagnosis not present

## 2019-05-12 DIAGNOSIS — Z510 Encounter for antineoplastic radiation therapy: Secondary | ICD-10-CM | POA: Diagnosis not present

## 2019-05-13 ENCOUNTER — Ambulatory Visit: Payer: Medicare Other

## 2019-05-14 ENCOUNTER — Encounter: Payer: Self-pay | Admitting: Gastroenterology

## 2019-05-15 ENCOUNTER — Ambulatory Visit: Payer: Medicare Other

## 2019-05-16 ENCOUNTER — Ambulatory Visit
Admission: RE | Admit: 2019-05-16 | Discharge: 2019-05-16 | Disposition: A | Discharge status: Home / Self Care | Payer: Medicare Other | Source: Ambulatory Visit | Attending: Radiation Oncology | Admitting: Radiation Oncology

## 2019-05-16 ENCOUNTER — Other Ambulatory Visit: Payer: Self-pay

## 2019-05-16 DIAGNOSIS — C3431 Malignant neoplasm of lower lobe, right bronchus or lung: Secondary | ICD-10-CM | POA: Diagnosis not present

## 2019-05-16 DIAGNOSIS — Z510 Encounter for antineoplastic radiation therapy: Secondary | ICD-10-CM | POA: Diagnosis not present

## 2019-05-16 DIAGNOSIS — C771 Secondary and unspecified malignant neoplasm of intrathoracic lymph nodes: Secondary | ICD-10-CM | POA: Diagnosis not present

## 2019-05-16 DIAGNOSIS — Z87891 Personal history of nicotine dependence: Secondary | ICD-10-CM | POA: Diagnosis not present

## 2019-05-17 ENCOUNTER — Inpatient Hospital Stay: Payer: Medicare Other

## 2019-05-17 ENCOUNTER — Inpatient Hospital Stay (HOSPITAL_BASED_OUTPATIENT_CLINIC_OR_DEPARTMENT_OTHER): Payer: Medicare Other | Admitting: Internal Medicine

## 2019-05-17 ENCOUNTER — Ambulatory Visit
Admission: RE | Admit: 2019-05-17 | Discharge: 2019-05-17 | Disposition: A | Discharge status: Home / Self Care | Payer: Medicare Other | Source: Ambulatory Visit | Attending: Radiation Oncology | Admitting: Radiation Oncology

## 2019-05-17 ENCOUNTER — Other Ambulatory Visit: Payer: Self-pay

## 2019-05-17 ENCOUNTER — Ambulatory Visit: Payer: Medicare Other

## 2019-05-17 ENCOUNTER — Telehealth: Payer: Self-pay

## 2019-05-17 DIAGNOSIS — Z87891 Personal history of nicotine dependence: Secondary | ICD-10-CM | POA: Diagnosis not present

## 2019-05-17 DIAGNOSIS — Z95828 Presence of other vascular implants and grafts: Secondary | ICD-10-CM

## 2019-05-17 DIAGNOSIS — K219 Gastro-esophageal reflux disease without esophagitis: Secondary | ICD-10-CM | POA: Diagnosis not present

## 2019-05-17 DIAGNOSIS — R5383 Other fatigue: Secondary | ICD-10-CM | POA: Diagnosis not present

## 2019-05-17 DIAGNOSIS — R112 Nausea with vomiting, unspecified: Secondary | ICD-10-CM | POA: Diagnosis not present

## 2019-05-17 DIAGNOSIS — F5101 Primary insomnia: Secondary | ICD-10-CM

## 2019-05-17 DIAGNOSIS — F419 Anxiety disorder, unspecified: Secondary | ICD-10-CM | POA: Diagnosis not present

## 2019-05-17 DIAGNOSIS — C3431 Malignant neoplasm of lower lobe, right bronchus or lung: Secondary | ICD-10-CM

## 2019-05-17 DIAGNOSIS — R0789 Other chest pain: Secondary | ICD-10-CM | POA: Diagnosis not present

## 2019-05-17 DIAGNOSIS — Z7982 Long term (current) use of aspirin: Secondary | ICD-10-CM | POA: Diagnosis not present

## 2019-05-17 DIAGNOSIS — Z5111 Encounter for antineoplastic chemotherapy: Secondary | ICD-10-CM | POA: Diagnosis not present

## 2019-05-17 DIAGNOSIS — E785 Hyperlipidemia, unspecified: Secondary | ICD-10-CM | POA: Diagnosis not present

## 2019-05-17 DIAGNOSIS — J449 Chronic obstructive pulmonary disease, unspecified: Secondary | ICD-10-CM

## 2019-05-17 DIAGNOSIS — Z515 Encounter for palliative care: Secondary | ICD-10-CM | POA: Diagnosis not present

## 2019-05-17 DIAGNOSIS — Z8521 Personal history of malignant neoplasm of larynx: Secondary | ICD-10-CM | POA: Diagnosis not present

## 2019-05-17 DIAGNOSIS — K521 Toxic gastroenteritis and colitis: Secondary | ICD-10-CM | POA: Diagnosis not present

## 2019-05-17 DIAGNOSIS — Z79899 Other long term (current) drug therapy: Secondary | ICD-10-CM | POA: Diagnosis not present

## 2019-05-17 DIAGNOSIS — C771 Secondary and unspecified malignant neoplasm of intrathoracic lymph nodes: Secondary | ICD-10-CM | POA: Diagnosis not present

## 2019-05-17 DIAGNOSIS — R400 Somnolence: Secondary | ICD-10-CM | POA: Diagnosis not present

## 2019-05-17 DIAGNOSIS — Z510 Encounter for antineoplastic radiation therapy: Secondary | ICD-10-CM | POA: Diagnosis not present

## 2019-05-17 LAB — CBC WITH DIFFERENTIAL/PLATELET
Abs Immature Granulocytes: 0.01 10*3/uL (ref 0.00–0.07)
Basophils Absolute: 0 10*3/uL (ref 0.0–0.1)
Basophils Relative: 2 %
Eosinophils Absolute: 0 10*3/uL (ref 0.0–0.5)
Eosinophils Relative: 1 %
HCT: 28.9 % — ABNORMAL LOW (ref 36.0–46.0)
Hemoglobin: 9.2 g/dL — ABNORMAL LOW (ref 12.0–15.0)
Immature Granulocytes: 1 %
Lymphocytes Relative: 17 %
Lymphs Abs: 0.3 10*3/uL — ABNORMAL LOW (ref 0.7–4.0)
MCH: 25.9 pg — ABNORMAL LOW (ref 26.0–34.0)
MCHC: 31.8 g/dL (ref 30.0–36.0)
MCV: 81.4 fL (ref 80.0–100.0)
Monocytes Absolute: 0.2 10*3/uL (ref 0.1–1.0)
Monocytes Relative: 10 %
Neutro Abs: 1.4 10*3/uL — ABNORMAL LOW (ref 1.7–7.7)
Neutrophils Relative %: 69 %
Platelets: 144 10*3/uL — ABNORMAL LOW (ref 150–400)
RBC: 3.55 MIL/uL — ABNORMAL LOW (ref 3.87–5.11)
RDW: 15.2 % (ref 11.5–15.5)
WBC: 2 10*3/uL — ABNORMAL LOW (ref 4.0–10.5)
nRBC: 0 % (ref 0.0–0.2)

## 2019-05-17 LAB — COMPREHENSIVE METABOLIC PANEL
ALT: 15 U/L (ref 0–44)
AST: 21 U/L (ref 15–41)
Albumin: 3.5 g/dL (ref 3.5–5.0)
Alkaline Phosphatase: 74 U/L (ref 38–126)
Anion gap: 8 (ref 5–15)
BUN: 9 mg/dL (ref 6–20)
CO2: 26 mmol/L (ref 22–32)
Calcium: 8.6 mg/dL — ABNORMAL LOW (ref 8.9–10.3)
Chloride: 101 mmol/L (ref 98–111)
Creatinine, Ser: 0.69 mg/dL (ref 0.44–1.00)
GFR calc Af Amer: >60 mL/min (ref >60)
GFR calc non Af Amer: >60 mL/min (ref >60)
Glucose, Bld: 117 mg/dL — ABNORMAL HIGH (ref 70–99)
Potassium: 3.3 mmol/L — ABNORMAL LOW (ref 3.5–5.1)
Sodium: 135 mmol/L (ref 135–145)
Total Bilirubin: 0.9 mg/dL (ref 0.3–1.2)
Total Protein: 7.2 g/dL (ref 6.5–8.1)

## 2019-05-17 MED ORDER — OXYCODONE-ACETAMINOPHEN 5-325 MG PO TABS: 1.0000 | ORAL_TABLET | Freq: Three times a day (TID) | ORAL | 0 refills | Status: AC | PRN

## 2019-05-17 MED ORDER — LEVOFLOXACIN 500 MG PO TABS: 500.0000 mg | ORAL_TABLET | Freq: Every day | ORAL | 0 refills | Status: DC

## 2019-05-17 MED ORDER — SODIUM CHLORIDE 0.9% FLUSH
10.0000 mL | Freq: Once | INTRAVENOUS | Status: DC
  Filled 2019-05-17: qty 10

## 2019-05-17 NOTE — Progress Notes (Signed)
+[facility] OFFICE PROGRESS NOTE  Patient Care Team: Valerie Roys, DO as PCP - General (Family Medicine) Anabel Bene, MD as Referring Physician (Neurology) Telford Nab, RN as Registered Nurse  Cancer Staging No matching staging information was found for the patient.   Oncology History Overview Note  # 2011-laryngeal cancer/SCC; Stage II; April-June 2011 s/p  CRT [Dr.Choski/Bennett]  # 2019- likely STAGE I 10 mm RLL nodule [incidental]; s/p wedge resection-July 2019-stage I squamous cell lung cancer. NO  Adjuvant therapy.  # May 28th 2020-stage III lung cancer-recurrent  RIGHT HILAR-- POSITIVE FOR MALIGNANT NEOPLASTIC CELLS. - CONSISTENT WITH METASTATIC SQUAMOUS CELL CARCINOMA.  #June 2020- Carbo-taxol- with RT  # COPD  # post thoracotomy syndrome  # [place]/EGD [Dr.Anna]- July NEG  DIAGNOSIS: RLL lung ca  STAGE:  III ; GOALS: cure  CURRENT/MOST RECENT THERAPY: Carbo-taxol- RT planned   Laryngeal cancer ([place])  Primary cancer of right lower lobe of lung ([place])  05/27/2018 Initial Diagnosis   Primary cancer of right lower lobe of lung ([place])   04/17/2019 -  Chemotherapy   The patient had palonosetron (ALOXI) injection 0.25 mg, 0.25 mg, Intravenous,  Once, 4 of 8 cycles Administration: 0.25 mg (04/17/2019), 0.25 mg (04/24/2019), 0.25 mg (05/02/2019), 0.25 mg (05/10/2019) CARBOplatin (PARAPLATIN) 270 mg in sodium chloride 0.9 % 250 mL chemo infusion, 270 mg (100 % of original dose 265.8 mg), Intravenous,  Once, 4 of 8 cycles Dose modification:   (original dose 265.8 mg, Cycle 1) Administration: 270 mg (04/17/2019), 270 mg (04/24/2019), 270 mg (05/02/2019), 270 mg (05/10/2019) PACLitaxel (TAXOL) 90 mg in sodium chloride 0.9 % 250 mL chemo infusion (</= 80mg /m2), 45 mg/m2 = 90 mg, Intravenous,  Once, 4 of 8 cycles Administration: 90 mg (04/17/2019), 90 mg (04/24/2019), 90 mg (05/02/2019), 90 mg (05/10/2019)  for chemotherapy treatment.      INTERVAL HISTORY:  Susan Houston 58 y.o.  female recurrent stage III squamous lung cancer -currently on carbotaxol with radiation.  In the interim patient had a chest x-ray for low-grade fever temperature negative.  However she went underwent CT scan-that showed possible left lower lobe bronchopneumonia.  No fever no chills.  However this morning patient was noted to have redness/erythema of the port incision.  As per the nursing she had some " pus" expressed.  Patient continues to have chronic right chest wall pain.  Denies any difficulty breathing or cough.   Review of Systems  Constitutional: Positive for malaise/fatigue. Negative for chills, diaphoresis, fever and weight loss.  HENT: Negative for nosebleeds and sore throat.   Eyes: Negative for double vision.  Respiratory: Negative for hemoptysis, sputum production and wheezing.   Cardiovascular: Positive for chest pain. Negative for palpitations, orthopnea and leg swelling.  Gastrointestinal: Negative for abdominal pain, blood in stool, heartburn, melena, nausea and vomiting.  Genitourinary: Negative for dysuria, frequency and urgency.  Musculoskeletal: Negative for back pain and joint pain.  Skin: Positive for rash. Negative for itching.  Neurological: Negative for dizziness, tingling, focal weakness, weakness and headaches.  Endo/Heme/Allergies: Does not bruise/bleed easily.  Psychiatric/Behavioral: Negative for depression. The patient is nervous/anxious and has insomnia.       PAST MEDICAL HISTORY :  Past Medical History:  Diagnosis Date   Anemia    vitamin b12 deficiency. no longer taking supplements   Anxiety    Arthritis    Asthma    Back pain    Bruises easily    Chronic leg pain  BILATERAL   COPD (chronic obstructive pulmonary disease) (HCC)    no inhalers for over 1 year   Depression    Difficulty sleeping    Diverticulitis    Dyspnea    due to having part of lung removed in 2019   Dysrhythmia 04/2018   brady episodes.  cleared by dr. Clayborn Bigness   Facet syndrome, lumbar 03/28/2015   GERD (gastroesophageal reflux disease)    Hemorrhoids    History of kidney stones 04/2018   recently passed stone   Hyperlipidemia    Incontinence of urine    Laryngeal cancer ([place]) 2009   LARYNX CANCER - CHEMO / RADIATION (NO SURGERY)    Migraine    Migraine without aura and without status migrainosus, not intractable 09/11/2016   Mild cognitive impairment 11/23/2017   MRSA infection greater than 3 months ago    2008 right side of face   Nerve damage    right leg. thinks this is from her TKR   Nodule of lower lobe of right lung 04/22/2018   Oxygen deficiency    2L/HS   Primary cancer of right lower lobe of lung ([place]) 04/18/2018   Surgical resection of RLL   Wears dentures    full upper and lower    PAST SURGICAL HISTORY :   Past Surgical History:  Procedure Laterality Date   ABDOMINAL HYSTERECTOMY  1983   BLADDER SUSPENSION  2014   CATARACT EXTRACTION W/PHACO Left 05/11/2018   Procedure: CATARACT EXTRACTION PHACO AND INTRAOCULAR LENS PLACEMENT ([place]) LEFT;  Surgeon: Leandrew Koyanagi, MD;  Location: [facility];  Service: Ophthalmology;  Laterality: Left;  PREFERS EARLY   CATARACT EXTRACTION W/PHACO Right 08/11/2018   Procedure: CATARACT EXTRACTION PHACO AND INTRAOCULAR LENS PLACEMENT ([facility]);  Surgeon: Leandrew Koyanagi, MD;  Location: [facility];  Service: Ophthalmology;  Laterality: Right;  CDE 5:02 Total Time 01:35 Fluid lot # [id number]   CHOLECYSTECTOMY  1998   COLONOSCOPY     COLONOSCOPY WITH PROPOFOL N/A 04/28/2019   Procedure: COLONOSCOPY WITH PROPOFOL;  Surgeon: Jonathon Bellows, MD;  Location: [facility];  Service: Gastroenterology;  Laterality: N/A;   ENDOBRONCHIAL ULTRASOUND N/A 03/20/2019   Procedure: ENDOBRONCHIAL ULTRASOUND;  Surgeon: Flora Lipps, MD;  Location: [facility];  Service: Cardiopulmonary;  Laterality: N/A;   ESOPHAGOGASTRODUODENOSCOPY  2015    ESOPHAGOGASTRODUODENOSCOPY (EGD) WITH PROPOFOL N/A 11/20/2016   Procedure: ESOPHAGOGASTRODUODENOSCOPY (EGD) WITH PROPOFOL;  Surgeon: Jonathon Bellows, MD;  Location: [facility]COPY;  Service: Endoscopy;  Laterality: N/A;   ESOPHAGOGASTRODUODENOSCOPY (EGD) WITH PROPOFOL N/A 04/28/2019   Procedure: ESOPHAGOGASTRODUODENOSCOPY (EGD) WITH PROPOFOL;  Surgeon: Jonathon Bellows, MD;  Location: [facility];  Service: Gastroenterology;  Laterality: N/A;  Pt needs appointment after 9:30am   EXCISION NEUROMA Bilateral 09/24/2014   Procedure: BILATERAL OPEN SAPHENOUS NEURECTOMIES AND OPEN LEFT PERIPATELLA OSETOPHYTECTOMY;  Surgeon: Mauri Pole, MD;  Location: [facility];  Service: Orthopedics;  Laterality: Bilateral;   EYE SURGERY Left 05/11/2018   cataract extraction   JOINT REPLACEMENT  2009/2010   BIL TOTAL KNEES   JOINT REPLACEMENT  2020   left   PORTACATH PLACEMENT N/A 04/05/2019   Procedure: INSERTION PORT-A-CATH;  Surgeon: Nestor Lewandowsky, MD;  Location: [facility];  Service: General;  Laterality: N/A;   THORACOTOMY/LOBECTOMY Right 05/16/2018   Procedure: THORACOTOMY/POSSIBLE LOBECTOMY;  Surgeon: Nestor Lewandowsky, MD;  Location: [facility];  Service: Thoracic;  Laterality: Right;   TONSILLECTOMY     TOTAL KNEE REVISION Left 11/29/2018   Procedure: Left TOTAL KNEE REVISION;  Surgeon: Hessie Knows, MD;  Location: [facility];  Service: Orthopedics;  Laterality: Left;   VIDEO BRONCHOSCOPY N/A 05/16/2018   Procedure: PREOP  BRONCHOSCOPY;  Surgeon: Nestor Lewandowsky, MD;  Location: [facility];  Service: Thoracic;  Laterality: N/A;    FAMILY HISTORY :   Family History  Problem Relation Age of Onset   Asthma Mother    Diabetes Mother    Hyperlipidemia Mother    Hypertension Mother    Cirrhosis Mother        Non-alcoholic   Arthritis Father    Cancer Father        Bone   Hyperlipidemia Father    Hypertension Father    Anxiety disorder Sister    Depression Sister    Kidney Stones Son    Stroke Maternal  Grandmother    Breast cancer Maternal Grandmother 1   Cancer Maternal Grandfather        lung   Pneumonia Paternal Grandmother    Alzheimer's disease Paternal Grandfather    Diabetes Sister    Gout Sister    Hypertension Sister    Alcohol abuse Brother    Heart disease Brother        massive MI    SOCIAL HISTORY:   Social History   Tobacco Use   Smoking status: Former Smoker    Packs/day: 0.50    Types: Cigarettes    Quit date: 04/07/2016    Years since quitting: 3.1   Smokeless tobacco: Never Used   Tobacco comment: patient has not smoked x 10 days.06/28/17 chantix  Substance Use Topics   Alcohol use: No    Alcohol/week: 0.0 standard drinks   Drug use: No    ALLERGIES:  is allergic to morphine and related; cymbalta [duloxetine hcl]; adhesive [tape]; and lexapro [escitalopram oxalate].  MEDICATIONS:  Current Outpatient Medications  Medication Sig Dispense Refill   albuterol (PROVENTIL HFA;VENTOLIN HFA) 108 (90 Base) MCG/ACT inhaler Inhale 2 puffs into the lungs every 4 (four) hours as needed for wheezing or shortness of breath. 1 Inhaler 2   albuterol (PROVENTIL) (2.5 MG/3ML) 0.083% nebulizer solution Take 3 mLs (2.5 mg total) by nebulization every 4 (four) hours as needed for wheezing or shortness of breath. 75 mL 12   aspirin EC 81 MG tablet Take 81 mg by mouth daily.     aspirin-acetaminophen-caffeine (EXCEDRIN MIGRAINE) 250-250-65 MG tablet Take 1 tablet by mouth daily as needed for headache.     clonazePAM (KLONOPIN) 0.5 MG tablet Take 0.5-1 tablets (0.25-0.5 mg total) by mouth daily as needed for anxiety. Try to limit use and use only for severe anxiety and sleep problems 25 tablet 1   estradiol (ESTRACE) 1 MG tablet Take 1 mg by mouth daily.      hydrOXYzine (VISTARIL) 25 MG capsule TAKE 1 CAPSULE(25 MG) BY MOUTH TWICE DAILY AS NEEDED FOR SEVERE ANXIETY OR SYMPTOMS 60 capsule 1   lidocaine-prilocaine (EMLA) cream Apply to affected area once 30 g  3   omeprazole (PRILOSEC) 40 MG capsule Take 1 capsule (40 mg total) by mouth daily. (Patient taking differently: Take 40 mg by mouth every morning. ) 90 capsule 1   ondansetron (ZOFRAN) 8 MG tablet Take 1 tablet (8 mg total) by mouth 2 (two) times daily as needed for refractory nausea / vomiting. Start on day 3 after chemo. 30 tablet 1   oxyCODONE-acetaminophen (PERCOCET) 5-325 MG tablet Take 1 tablet by mouth every 8 (eight) hours as needed for severe pain. 45 tablet 0   OXYGEN Inhale 2 L  into the lungs at bedtime.     pregabalin (LYRICA) 50 MG capsule Take 1 capsule (50 mg total) by mouth 3 (three) times daily. 90 capsule 2   prochlorperazine (COMPAZINE) 10 MG tablet Take 1 tablet (10 mg total) by mouth every 6 (six) hours as needed (Nausea or vomiting). 30 tablet 1   sucralfate (CARAFATE) 1 g tablet Take 1 tablet (1 g total) by mouth 3 (three) times daily. Dissolve in 3-4 tbsp warm water, swish and swallow. 90 tablet 3   Suvorexant (BELSOMRA) 20 MG TABS Take 20 mg by mouth at bedtime. 30 tablet 2   traMADol (ULTRAM) 50 MG tablet Take 1 tablet (50 mg total) by mouth every 8 (eight) hours as needed. 90 tablet 0   FLUoxetine (PROZAC) 20 MG capsule Take 1 capsule (20 mg total) by mouth daily. To be combined with 40 mg (Patient not taking: Reported on 05/02/2019) 90 capsule 1   FLUoxetine (PROZAC) 40 MG capsule Take 1 capsule (40 mg total) by mouth daily. To be combined with 20 mg 90 capsule 1   guaiFENesin-codeine 100-10 MG/5ML syrup Take 10 mLs by mouth 3 (three) times daily as needed for cough. (Patient not taking: Reported on 05/17/2019) 120 mL 0   levofloxacin (LEVAQUIN) 500 MG tablet Take 1 tablet (500 mg total) by mouth daily. 7 tablet 0   loperamide (IMODIUM) 2 MG capsule Take 2 mg by mouth as needed for diarrhea or loose stools.     magic mouthwash SOLN Take 5 mLs by mouth 3 (three) times daily as needed for mouth pain. (Patient not taking: Reported on 05/17/2019) 240 mL 0    nystatin cream (MYCOSTATIN) Apply 1 application topically 2 (two) times daily. (Patient not taking: Reported on 05/17/2019) 30 g 0   No current facility-administered medications for this visit.    Facility-Administered Medications Ordered in Other Visits  Medication Dose Route Frequency Provider Last Rate Last Dose   ipratropium-albuterol (DUONEB) 0.5-2.5 (3) MG/3ML nebulizer solution 3 mL  3 mL Nebulization Q6H Burns, Jennifer E, NP       sodium chloride flush (NS) 0.9 % injection 10 mL  10 mL Intravenous Once Cammie Sickle, MD        PHYSICAL EXAMINATION: ECOG PERFORMANCE STATUS: 1 - Symptomatic but completely ambulatory  BP 136/88    Pulse (!) 105    Temp (!) 97.4 F (36.3 C) (Tympanic)    Resp 20   There were no vitals filed for this visit.  Physical Exam  Constitutional: She is oriented to person, place, and time and well-developed, well-nourished, and in no distress.  She is alone.  She is walking herself.  Not on oxygen.  HENT:  Head: Normocephalic and atraumatic.  Mouth/Throat: Oropharynx is clear and moist. No oropharyngeal exudate.  Eyes: Pupils are equal, round, and reactive to light.  Neck: Normal range of motion. Neck supple.  Cardiovascular: Normal rate and regular rhythm.  Pulmonary/Chest: No respiratory distress. She has no wheezes.  Right anterior chest wall-tenderness.  No rash.  Abdominal: Soft. Bowel sounds are normal. She exhibits no distension and no mass. There is no abdominal tenderness. There is no rebound and no guarding.  Musculoskeletal: Normal range of motion.        General: No tenderness or edema.  Neurological: She is alert and oriented to person, place, and time.  Skin: Skin is warm.  Mild erythema of the Mediport insertion site.  No pus can be expressed.  No fluctuation noted.  Psychiatric:  Affect normal.         LABORATORY DATA:  I have reviewed the data as listed    Component Value Date/Time   NA 135 05/17/2019 0837   NA 137  12/03/2017 1447   NA 137 05/14/2014 1457   K 3.3 (L) 05/17/2019 0837   K 3.2 (L) 05/14/2014 1457   CL 101 05/17/2019 0837   CL 103 05/14/2014 1457   CO2 26 05/17/2019 0837   CO2 28 05/14/2014 1457   GLUCOSE 117 (H) 05/17/2019 0837   GLUCOSE 80 05/14/2014 1457   BUN 9 05/17/2019 0837   BUN 8 12/03/2017 1447   BUN 8 05/14/2014 1457   CREATININE 0.69 05/17/2019 0837   CREATININE 0.79 05/14/2014 1457   CALCIUM 8.6 (L) 05/17/2019 0837   CALCIUM 8.9 05/14/2014 1457   PROT 7.2 05/17/2019 0837   PROT 6.8 12/03/2017 1447   PROT 7.0 05/14/2014 1457   ALBUMIN 3.5 05/17/2019 0837   ALBUMIN 3.8 12/03/2017 1447   ALBUMIN 3.3 (L) 05/14/2014 1457   AST 21 05/17/2019 0837   AST 9 (L) 05/14/2014 1457   ALT 15 05/17/2019 0837   ALT 15 05/14/2014 1457   ALKPHOS 74 05/17/2019 0837   ALKPHOS 87 05/14/2014 1457   BILITOT 0.9 05/17/2019 0837   BILITOT 0.6 12/03/2017 1447   BILITOT 0.2 05/14/2014 1457   GFRNONAA >60 05/17/2019 0837   GFRNONAA >60 05/14/2014 1457   GFRAA >60 05/17/2019 0837   GFRAA >60 05/14/2014 1457    No results found for: SPEP, UPEP  Lab Results  Component Value Date   WBC 2.0 (L) 05/17/2019   NEUTROABS 1.4 (L) 05/17/2019   HGB 9.2 (L) 05/17/2019   HCT 28.9 (L) 05/17/2019   MCV 81.4 05/17/2019   PLT 144 (L) 05/17/2019      Chemistry      Component Value Date/Time   NA 135 05/17/2019 0837   NA 137 12/03/2017 1447   NA 137 05/14/2014 1457   K 3.3 (L) 05/17/2019 0837   K 3.2 (L) 05/14/2014 1457   CL 101 05/17/2019 0837   CL 103 05/14/2014 1457   CO2 26 05/17/2019 0837   CO2 28 05/14/2014 1457   BUN 9 05/17/2019 0837   BUN 8 12/03/2017 1447   BUN 8 05/14/2014 1457   CREATININE 0.69 05/17/2019 0837   CREATININE 0.79 05/14/2014 1457      Component Value Date/Time   CALCIUM 8.6 (L) 05/17/2019 0837   CALCIUM 8.9 05/14/2014 1457   ALKPHOS 74 05/17/2019 0837   ALKPHOS 87 05/14/2014 1457   AST 21 05/17/2019 0837   AST 9 (L) 05/14/2014 1457   ALT 15  05/17/2019 0837   ALT 15 05/14/2014 1457   BILITOT 0.9 05/17/2019 0837   BILITOT 0.6 12/03/2017 1447   BILITOT 0.2 05/14/2014 1457       RADIOGRAPHIC STUDIES: I have personally reviewed the radiological images as listed and agreed with the findings in the report. No results found.   ASSESSMENT & PLAN:  Primary cancer of right lower lobe of lung ([place]) #Recurrent squamous cell carcinoma status post biopsy of right hilar lymph node-stage III; currently on  carbotaxol weekly; along with radiation [until August 14].    # HOLD # 5- carbo-Taxol. Labs today reviewed; ANC- 1.4; hb 9.2/ platlets- 144. See reason below [cellulitis/question bronchopneumonia]  # Cellulitis of port incision-does not appear to be deep-seated.  Recommend levaqiun 7 days.  Will check with Dr. Faith Rogue.  # Left lingular inflammation/ infection-question  radiation.  Recommend Levaquin.  # dysphagia-lower esophagus benign-appearing stricture ; however biopsy positive for dysplastic squamous cells.  Patient awaiting repeat EGD next week.  Will discuss with GI regarding timing given the ongoing chemoradiation  # # right chest wall pain- pleuritic; likely musculoskeletal. on Percocet every 8 hours.  Stable  # COPD- stable. Continue inhalers at home.  # insominia/anxiety- on trazadone/ seroqul; klonipin--stable.  #Discussed with the patient husband in detail.  # DISPOSITION:  # HOLD Treatment today # 1 week-MD carbo-Taxol/cbc/bmp/ MD- Dr.B     Orders Placed This Encounter  Procedures   CBC with Differential    Standing Status:   Future    Standing Expiration Date:   05/16/2020   Comprehensive metabolic panel    Standing Status:   Future    Standing Expiration Date:   05/16/2020   All questions were answered. The patient knows to call the clinic with any problems, questions or concerns.      Cammie Sickle, MD 05/17/2019 9:57 AM

## 2019-05-17 NOTE — Progress Notes (Signed)
Port incision open. Area of erythema around incision site. md aware.   md made aware that egd is planned for 8/7. Chemo will be r/s in 1 week. Dr. B will talk to Dr. Vicente Males regarding the planned procedure and timing of covid-testing and egd. Patient will need chemotherapy as planned.

## 2019-05-17 NOTE — Telephone Encounter (Signed)
pt called states she needed refill on belsomra and klonopin. pt was told that she should have enough refills of the klonopin until 06-05-19  to call [facility] and see if they have rx on hold. but that a message would be sent on the belsomra

## 2019-05-17 NOTE — Assessment & Plan Note (Addendum)
#  Recurrent squamous cell carcinoma status post biopsy of right hilar lymph node-stage III; currently on  carbotaxol weekly; along with radiation [until August 14].    # HOLD # 5- carbo-Taxol. Labs today reviewed; ANC- 1.4; hb 9.2/ platlets- 144. See reason below [cellulitis/question bronchopneumonia]  # Cellulitis of port incision-does not appear to be deep-seated.  Recommend levaqiun 7 days.  Will check with Dr. Faith Rogue.  # Left lingular inflammation/ infection-question radiation.  Recommend Levaquin.  # dysphagia-lower esophagus benign-appearing stricture ; however biopsy positive for dysplastic squamous cells.  Patient awaiting repeat EGD next week.  Will discuss with GI regarding timing given the ongoing chemoradiation  # # right chest wall pain- pleuritic; likely musculoskeletal. on Percocet every 8 hours.  Stable  # COPD- stable. Continue inhalers at home.  # insominia/anxiety- on trazadone/ seroqul; klonipin--stable.  #Discussed with the patient husband in detail.  # DISPOSITION:  # HOLD Treatment today # 1 week-MD carbo-Taxol/cbc/bmp/ MD- Dr.B

## 2019-05-18 ENCOUNTER — Other Ambulatory Visit: Payer: Self-pay | Admitting: Psychiatry

## 2019-05-18 ENCOUNTER — Other Ambulatory Visit: Payer: Self-pay

## 2019-05-18 ENCOUNTER — Ambulatory Visit
Admission: RE | Admit: 2019-05-18 | Discharge: 2019-05-18 | Disposition: A | Discharge status: Home / Self Care | Payer: Medicare Other | Source: Ambulatory Visit | Attending: Radiation Oncology | Admitting: Radiation Oncology

## 2019-05-18 DIAGNOSIS — C771 Secondary and unspecified malignant neoplasm of intrathoracic lymph nodes: Secondary | ICD-10-CM | POA: Diagnosis not present

## 2019-05-18 DIAGNOSIS — Z510 Encounter for antineoplastic radiation therapy: Secondary | ICD-10-CM | POA: Diagnosis not present

## 2019-05-18 DIAGNOSIS — F5101 Primary insomnia: Secondary | ICD-10-CM

## 2019-05-18 DIAGNOSIS — F411 Generalized anxiety disorder: Secondary | ICD-10-CM

## 2019-05-18 DIAGNOSIS — C3431 Malignant neoplasm of lower lobe, right bronchus or lung: Secondary | ICD-10-CM | POA: Diagnosis not present

## 2019-05-18 DIAGNOSIS — Z87891 Personal history of nicotine dependence: Secondary | ICD-10-CM | POA: Diagnosis not present

## 2019-05-18 MED ORDER — BELSOMRA 20 MG PO TABS: 20.0000 mg | ORAL_TABLET | Freq: Every day | ORAL | 2 refills | Status: AC

## 2019-05-18 NOTE — Telephone Encounter (Signed)
Sent belsomra.

## 2019-05-19 ENCOUNTER — Other Ambulatory Visit: Payer: Self-pay

## 2019-05-19 ENCOUNTER — Ambulatory Visit
Admission: RE | Admit: 2019-05-19 | Discharge: 2019-05-19 | Disposition: A | Discharge status: Home / Self Care | Payer: Medicare Other | Source: Ambulatory Visit | Attending: Radiation Oncology | Admitting: Radiation Oncology

## 2019-05-19 DIAGNOSIS — Z510 Encounter for antineoplastic radiation therapy: Secondary | ICD-10-CM | POA: Diagnosis not present

## 2019-05-19 DIAGNOSIS — C771 Secondary and unspecified malignant neoplasm of intrathoracic lymph nodes: Secondary | ICD-10-CM | POA: Diagnosis not present

## 2019-05-19 DIAGNOSIS — C3431 Malignant neoplasm of lower lobe, right bronchus or lung: Secondary | ICD-10-CM | POA: Diagnosis not present

## 2019-05-19 DIAGNOSIS — Z87891 Personal history of nicotine dependence: Secondary | ICD-10-CM | POA: Diagnosis not present

## 2019-05-21 ENCOUNTER — Other Ambulatory Visit: Payer: Self-pay | Admitting: Psychiatry

## 2019-05-21 DIAGNOSIS — F5101 Primary insomnia: Secondary | ICD-10-CM

## 2019-05-21 DIAGNOSIS — F411 Generalized anxiety disorder: Secondary | ICD-10-CM

## 2019-05-22 ENCOUNTER — Ambulatory Visit: Payer: Medicare Other

## 2019-05-22 ENCOUNTER — Telehealth: Payer: Self-pay

## 2019-05-22 NOTE — Telephone Encounter (Signed)
Nutrition Follow-up:  Patient with stage III squamous cell carcinoma of right lower lobe of lung.  Last chemo held due to cellulitis of port.  Noted planning EGD 9/4 due to dysphagia and biopsy positive for dysplastic squamous cell.    Called patient for nutrition follow-up.  Patient reports that her appetite is "so-so". I am just not hungry.  Reports some issues with nausea but controlled with medication. Denies diarrhea.  Reports some dysphagia with foods feeling like they are getting stuck in mid-chest area.  Patient reports that she has tried ensure and is able to drink it but does not drink it daily.  Reports drinking whole milk daily (multiple glasses).   Can't really describe a typical day to RD about what she eats.  Sounds like she just woke up.    Medications: reviewed  Labs: reviewed  Anthropometrics:   Weight 190 lb 3.2 oz on 7/22 decreased from 200 lb on 7/6  5% weight loss in the last 2 weeks   NUTRITION DIAGNOSIS: Inadequate oral intake continues   INTERVENTION:  Discussed strategies to increase calories and protein and provided examples of foods to eat.  Recommend patient drink oral nutrition supplement daily Discussed ways to change consistencies of foods to help with dysphagia (EGD pending) Contact information provided    MONITORING, EVALUATION, GOAL: Patient will consume adequate calories and protein to maintain weight during treatment   NEXT VISIT: phone follow-up Aug 17  Crescentia Boutwell B. Zenia Resides, [place], [place] Registered Dietitian [phone] (pager)

## 2019-05-22 NOTE — Telephone Encounter (Signed)
pt called states that she needs a refill on her klonopin

## 2019-05-22 NOTE — Telephone Encounter (Signed)
Sent Klonopin to pharmacy

## 2019-05-23 ENCOUNTER — Ambulatory Visit
Admission: RE | Admit: 2019-05-23 | Discharge: 2019-05-23 | Disposition: A | Discharge status: Home / Self Care | Payer: Medicare Other | Source: Ambulatory Visit | Attending: Radiation Oncology | Admitting: Radiation Oncology

## 2019-05-23 ENCOUNTER — Other Ambulatory Visit: Payer: Self-pay

## 2019-05-23 DIAGNOSIS — C771 Secondary and unspecified malignant neoplasm of intrathoracic lymph nodes: Secondary | ICD-10-CM | POA: Insufficient documentation

## 2019-05-23 DIAGNOSIS — C3431 Malignant neoplasm of lower lobe, right bronchus or lung: Secondary | ICD-10-CM | POA: Insufficient documentation

## 2019-05-23 DIAGNOSIS — Z87891 Personal history of nicotine dependence: Secondary | ICD-10-CM | POA: Insufficient documentation

## 2019-05-23 DIAGNOSIS — Z510 Encounter for antineoplastic radiation therapy: Secondary | ICD-10-CM | POA: Insufficient documentation

## 2019-05-24 ENCOUNTER — Inpatient Hospital Stay: Payer: Medicare Other | Attending: Internal Medicine

## 2019-05-24 ENCOUNTER — Ambulatory Visit
Admission: RE | Admit: 2019-05-24 | Discharge: 2019-05-24 | Disposition: A | Discharge status: Home / Self Care | Payer: Medicare Other | Source: Ambulatory Visit | Attending: Radiation Oncology | Admitting: Radiation Oncology

## 2019-05-24 ENCOUNTER — Other Ambulatory Visit: Payer: Self-pay

## 2019-05-24 ENCOUNTER — Inpatient Hospital Stay (HOSPITAL_BASED_OUTPATIENT_CLINIC_OR_DEPARTMENT_OTHER): Payer: Medicare Other | Admitting: Internal Medicine

## 2019-05-24 ENCOUNTER — Inpatient Hospital Stay: Payer: Medicare Other

## 2019-05-24 VITALS — BP 121/88 | HR 106 | Temp 98.7°F | Resp 18 | Wt 189.8 lb

## 2019-05-24 DIAGNOSIS — Z5111 Encounter for antineoplastic chemotherapy: Secondary | ICD-10-CM | POA: Diagnosis not present

## 2019-05-24 DIAGNOSIS — Z811 Family history of alcohol abuse and dependence: Secondary | ICD-10-CM | POA: Insufficient documentation

## 2019-05-24 DIAGNOSIS — Z825 Family history of asthma and other chronic lower respiratory diseases: Secondary | ICD-10-CM | POA: Insufficient documentation

## 2019-05-24 DIAGNOSIS — R0789 Other chest pain: Secondary | ICD-10-CM | POA: Diagnosis not present

## 2019-05-24 DIAGNOSIS — J449 Chronic obstructive pulmonary disease, unspecified: Secondary | ICD-10-CM | POA: Insufficient documentation

## 2019-05-24 DIAGNOSIS — R21 Rash and other nonspecific skin eruption: Secondary | ICD-10-CM | POA: Diagnosis not present

## 2019-05-24 DIAGNOSIS — Z888 Allergy status to other drugs, medicaments and biological substances status: Secondary | ICD-10-CM | POA: Diagnosis not present

## 2019-05-24 DIAGNOSIS — R0602 Shortness of breath: Secondary | ICD-10-CM | POA: Diagnosis not present

## 2019-05-24 DIAGNOSIS — Z8249 Family history of ischemic heart disease and other diseases of the circulatory system: Secondary | ICD-10-CM | POA: Diagnosis not present

## 2019-05-24 DIAGNOSIS — R079 Chest pain, unspecified: Secondary | ICD-10-CM | POA: Diagnosis not present

## 2019-05-24 DIAGNOSIS — Z79899 Other long term (current) drug therapy: Secondary | ICD-10-CM | POA: Diagnosis not present

## 2019-05-24 DIAGNOSIS — Z87891 Personal history of nicotine dependence: Secondary | ICD-10-CM | POA: Insufficient documentation

## 2019-05-24 DIAGNOSIS — R05 Cough: Secondary | ICD-10-CM | POA: Insufficient documentation

## 2019-05-24 DIAGNOSIS — Z885 Allergy status to narcotic agent status: Secondary | ICD-10-CM | POA: Insufficient documentation

## 2019-05-24 DIAGNOSIS — Z823 Family history of stroke: Secondary | ICD-10-CM | POA: Diagnosis not present

## 2019-05-24 DIAGNOSIS — Z8379 Family history of other diseases of the digestive system: Secondary | ICD-10-CM | POA: Insufficient documentation

## 2019-05-24 DIAGNOSIS — Z83438 Family history of other disorder of lipoprotein metabolism and other lipidemia: Secondary | ICD-10-CM | POA: Diagnosis not present

## 2019-05-24 DIAGNOSIS — F419 Anxiety disorder, unspecified: Secondary | ICD-10-CM | POA: Insufficient documentation

## 2019-05-24 DIAGNOSIS — R1310 Dysphagia, unspecified: Secondary | ICD-10-CM | POA: Insufficient documentation

## 2019-05-24 DIAGNOSIS — C3431 Malignant neoplasm of lower lobe, right bronchus or lung: Secondary | ICD-10-CM

## 2019-05-24 DIAGNOSIS — Z833 Family history of diabetes mellitus: Secondary | ICD-10-CM | POA: Diagnosis not present

## 2019-05-24 DIAGNOSIS — Z841 Family history of disorders of kidney and ureter: Secondary | ICD-10-CM | POA: Diagnosis not present

## 2019-05-24 DIAGNOSIS — C771 Secondary and unspecified malignant neoplasm of intrathoracic lymph nodes: Secondary | ICD-10-CM | POA: Diagnosis not present

## 2019-05-24 DIAGNOSIS — R5383 Other fatigue: Secondary | ICD-10-CM | POA: Insufficient documentation

## 2019-05-24 DIAGNOSIS — C329 Malignant neoplasm of larynx, unspecified: Secondary | ICD-10-CM | POA: Diagnosis not present

## 2019-05-24 DIAGNOSIS — Z510 Encounter for antineoplastic radiation therapy: Secondary | ICD-10-CM | POA: Diagnosis not present

## 2019-05-24 DIAGNOSIS — Z8261 Family history of arthritis: Secondary | ICD-10-CM | POA: Insufficient documentation

## 2019-05-24 DIAGNOSIS — Z818 Family history of other mental and behavioral disorders: Secondary | ICD-10-CM | POA: Insufficient documentation

## 2019-05-24 DIAGNOSIS — Z820 Family history of epilepsy and other diseases of the nervous system: Secondary | ICD-10-CM | POA: Insufficient documentation

## 2019-05-24 LAB — CBC WITH DIFFERENTIAL/PLATELET
Abs Immature Granulocytes: 0.03 10*3/uL (ref 0.00–0.07)
Basophils Absolute: 0 10*3/uL (ref 0.0–0.1)
Basophils Relative: 1 %
Eosinophils Absolute: 0 10*3/uL (ref 0.0–0.5)
Eosinophils Relative: 1 %
HCT: 32.5 % — ABNORMAL LOW (ref 36.0–46.0)
Hemoglobin: 10.3 g/dL — ABNORMAL LOW (ref 12.0–15.0)
Immature Granulocytes: 2 %
Lymphocytes Relative: 19 %
Lymphs Abs: 0.4 10*3/uL — ABNORMAL LOW (ref 0.7–4.0)
MCH: 27 pg (ref 26.0–34.0)
MCHC: 31.7 g/dL (ref 30.0–36.0)
MCV: 85.1 fL (ref 80.0–100.0)
Monocytes Absolute: 0.4 10*3/uL (ref 0.1–1.0)
Monocytes Relative: 23 %
Neutro Abs: 1 10*3/uL — ABNORMAL LOW (ref 1.7–7.7)
Neutrophils Relative %: 54 %
Platelets: 181 10*3/uL (ref 150–400)
RBC: 3.82 MIL/uL — ABNORMAL LOW (ref 3.87–5.11)
RDW: 19.9 % — ABNORMAL HIGH (ref 11.5–15.5)
WBC: 1.8 10*3/uL — ABNORMAL LOW (ref 4.0–10.5)
nRBC: 0 % (ref 0.0–0.2)

## 2019-05-24 LAB — COMPREHENSIVE METABOLIC PANEL
ALT: 13 U/L (ref 0–44)
AST: 23 U/L (ref 15–41)
Albumin: 3.6 g/dL (ref 3.5–5.0)
Alkaline Phosphatase: 86 U/L (ref 38–126)
Anion gap: 11 (ref 5–15)
BUN: 16 mg/dL (ref 6–20)
CO2: 25 mmol/L (ref 22–32)
Calcium: 8.9 mg/dL (ref 8.9–10.3)
Chloride: 99 mmol/L (ref 98–111)
Creatinine, Ser: 1.04 mg/dL — ABNORMAL HIGH (ref 0.44–1.00)
GFR calc Af Amer: >60 mL/min (ref >60)
GFR calc non Af Amer: 59 mL/min — ABNORMAL LOW (ref >60)
Glucose, Bld: 104 mg/dL — ABNORMAL HIGH (ref 70–99)
Potassium: 4.3 mmol/L (ref 3.5–5.1)
Sodium: 135 mmol/L (ref 135–145)
Total Bilirubin: 0.4 mg/dL (ref 0.3–1.2)
Total Protein: 7.8 g/dL (ref 6.5–8.1)

## 2019-05-24 MED ORDER — HEPARIN SOD (PORK) LOCK FLUSH 100 UNIT/ML IV SOLN
500.0000 [IU] | Freq: Once | INTRAVENOUS | Status: AC
  Administered 2019-05-24: 500 [IU] via INTRAVENOUS
  Filled 2019-05-24: qty 5

## 2019-05-24 MED ORDER — PALONOSETRON HCL INJECTION 0.25 MG/5ML
0.2500 mg | Freq: Once | INTRAVENOUS | Status: AC
  Administered 2019-05-24: 0.25 mg via INTRAVENOUS
  Filled 2019-05-24: qty 5

## 2019-05-24 MED ORDER — SODIUM CHLORIDE 0.9 % IV SOLN
Freq: Once | INTRAVENOUS | Status: AC
  Administered 2019-05-24: 11:00:00 via INTRAVENOUS
  Filled 2019-05-24: qty 250

## 2019-05-24 MED ORDER — SODIUM CHLORIDE 0.9 % IV SOLN
45.0000 mg/m2 | Freq: Once | INTRAVENOUS | Status: AC
  Administered 2019-05-24: 90 mg via INTRAVENOUS
  Filled 2019-05-24: qty 15

## 2019-05-24 MED ORDER — SODIUM CHLORIDE 0.9 % IV SOLN
20.0000 mg | Freq: Once | INTRAVENOUS | Status: AC
  Administered 2019-05-24: 20 mg via INTRAVENOUS
  Filled 2019-05-24: qty 2

## 2019-05-24 MED ORDER — SODIUM CHLORIDE 0.9% FLUSH
10.0000 mL | INTRAVENOUS | Status: DC | PRN
  Administered 2019-05-24: 10 mL via INTRAVENOUS
  Filled 2019-05-24: qty 10

## 2019-05-24 MED ORDER — FAMOTIDINE IN NACL 20-0.9 MG/50ML-% IV SOLN
20.0000 mg | Freq: Once | INTRAVENOUS | Status: AC
  Administered 2019-05-24: 20 mg via INTRAVENOUS
  Filled 2019-05-24: qty 50

## 2019-05-24 MED ORDER — SODIUM CHLORIDE 0.9 % IV SOLN
216.0000 mg | Freq: Once | INTRAVENOUS | Status: AC
  Administered 2019-05-24: 14:00:00 220 mg via INTRAVENOUS
  Filled 2019-05-24: qty 22

## 2019-05-24 MED ORDER — DIPHENHYDRAMINE HCL 50 MG/ML IJ SOLN
50.0000 mg | Freq: Once | INTRAMUSCULAR | Status: AC
  Administered 2019-05-24: 50 mg via INTRAVENOUS
  Filled 2019-05-24: qty 1

## 2019-05-24 NOTE — Assessment & Plan Note (Addendum)
#  Recurrent squamous cell carcinoma status post biopsy of right hilar lymph node-stage III; currently on  carbotaxol weekly; along with radiation [until August 14].    #Proceed with # 5- carbo-Taxol. Labs today reviewed; ANC- 1.0; hb 10.3/ platlets-181  # Cellulitis of port incision s/p levaquin- improved.   # Left lingular inflammation/ infection-question radiation- improved.  Status post Levaquin.  # dysphagia-lower esophagus benign-appearing stricture ; however biopsy positive for dysplastic squamous cells.  Discussed with Dr. Vicente Males who kindly agrees to reschedule the endoscopy towards the end of August.  #  right chest wall pain- pleuritic; likely musculoskeletal. on Percocet every 8 hours.  Stable  # COPD- stable. Continue inhalers at home.  # insominia/anxiety- on trazadone/ seroqul; klonipin--stable.  # DISPOSITION:  # proceed with carbo-Taxol today # 1 week-MD carbo-Taxol/cbc/bmp-Dr.B

## 2019-05-24 NOTE — Progress Notes (Signed)
VItals signs and results reviewed by Dr. Rogue Bussing, proceed with treatment today per Dr. Rogue Bussing

## 2019-05-24 NOTE — Progress Notes (Signed)
Patient reports worsening SOBr on exertion. Also gets nausea/vomiting with eating.  Zofran or Compazine does not help with symptoms.  Pulse is 106 on check today and she does feel like her heart is racing.

## 2019-05-24 NOTE — Progress Notes (Signed)
+[facility] OFFICE PROGRESS NOTE  Patient Care Team: Valerie Roys, DO as PCP - General (Family Medicine) Anabel Bene, MD as Referring Physician (Neurology) Telford Nab, RN as Registered Nurse  Cancer Staging No matching staging information was found for the patient.   Oncology History Overview Note  # 2011-laryngeal cancer/SCC; Stage II; April-June 2011 s/p  CRT [Dr.Choski/Bennett]  # 2019- likely STAGE I 10 mm RLL nodule [incidental]; s/p wedge resection-July 2019-stage I squamous cell lung cancer. NO  Adjuvant therapy.  # May 28th 2020-stage III lung cancer-recurrent  RIGHT HILAR-- POSITIVE FOR MALIGNANT NEOPLASTIC CELLS. - CONSISTENT WITH METASTATIC SQUAMOUS CELL CARCINOMA.  #June 2020- Carbo-taxol- with RT  # COPD  # post thoracotomy syndrome  # EGD [Dr.Anna]- July,2020 -" dysplastic squamous cells" awaiting repeating EGD end of August-early September  DIAGNOSIS: RLL lung ca  STAGE:  III ; GOALS: cure  CURRENT/MOST RECENT THERAPY: Carbo-taxol- RT    Laryngeal cancer ([place])  Primary cancer of right lower lobe of lung ([place])  05/27/2018 Initial Diagnosis   Primary cancer of right lower lobe of lung ([place])   04/17/2019 -  Chemotherapy   The patient had palonosetron (ALOXI) injection 0.25 mg, 0.25 mg, Intravenous,  Once, 5 of 8 cycles Administration: 0.25 mg (04/17/2019), 0.25 mg (04/24/2019), 0.25 mg (05/02/2019), 0.25 mg (05/10/2019) CARBOplatin (PARAPLATIN) 270 mg in sodium chloride 0.9 % 250 mL chemo infusion, 270 mg (100 % of original dose 265.8 mg), Intravenous,  Once, 5 of 8 cycles Dose modification:   (original dose 265.8 mg, Cycle 1) Administration: 270 mg (04/17/2019), 270 mg (04/24/2019), 270 mg (05/02/2019), 270 mg (05/10/2019) PACLitaxel (TAXOL) 90 mg in sodium chloride 0.9 % 250 mL chemo infusion (</= 80mg /m2), 45 mg/m2 = 90 mg, Intravenous,  Once, 5 of 8 cycles Administration: 90 mg (04/17/2019), 90 mg (04/24/2019), 90 mg (05/02/2019), 90 mg  (05/10/2019)  for chemotherapy treatment.      INTERVAL HISTORY:  Susan Houston 58 y.o.  female recurrent stage III squamous lung cancer -currently on carbotaxol with radiation is here for follow-up.  Chemotherapy was held last week because of cellulitis of the port incision/CT scan suggestive of left bronchopneumonia.  Patient was treated with Levaquin for 7 days.  Noted to have improvement of the port site erythema/swelling.  No new cough or shortness of breath.  Chronic right wall chest pain.   Review of Systems  Constitutional: Positive for malaise/fatigue. Negative for chills, diaphoresis, fever and weight loss.  HENT: Negative for nosebleeds and sore throat.   Eyes: Negative for double vision.  Respiratory: Negative for hemoptysis, sputum production and wheezing.   Cardiovascular: Positive for chest pain. Negative for palpitations, orthopnea and leg swelling.  Gastrointestinal: Negative for abdominal pain, blood in stool, heartburn, melena, nausea and vomiting.  Genitourinary: Negative for dysuria, frequency and urgency.  Musculoskeletal: Negative for back pain and joint pain.  Skin: Positive for rash. Negative for itching.  Neurological: Negative for dizziness, tingling, focal weakness, weakness and headaches.  Endo/Heme/Allergies: Does not bruise/bleed easily.  Psychiatric/Behavioral: Negative for depression. The patient is nervous/anxious and has insomnia.       PAST MEDICAL HISTORY :  Past Medical History:  Diagnosis Date  . Anemia    vitamin b12 deficiency. no longer taking supplements  . Anxiety   . Arthritis   . Asthma   . Back pain   . Bruises easily   . Chronic leg pain    BILATERAL  . COPD (chronic obstructive pulmonary disease) ([place])  no inhalers for over 1 year  . Depression   . Difficulty sleeping   . Diverticulitis   . Dyspnea    due to having part of lung removed in 2019  . Dysrhythmia 04/2018   brady episodes. cleared by dr. Clayborn Bigness  . Facet  syndrome, lumbar 03/28/2015  . GERD (gastroesophageal reflux disease)   . Hemorrhoids   . History of kidney stones 04/2018   recently passed stone  . Hyperlipidemia   . Incontinence of urine   . Laryngeal cancer ([place]) 2009   LARYNX CANCER - CHEMO / RADIATION (NO SURGERY)   . Migraine   . Migraine without aura and without status migrainosus, not intractable 09/11/2016  . Mild cognitive impairment 11/23/2017  . MRSA infection greater than 3 months ago    2008 right side of face  . Nerve damage    right leg. thinks this is from her TKR  . Nodule of lower lobe of right lung 04/22/2018  . Oxygen deficiency    2L/HS  . Primary cancer of right lower lobe of lung ([place]) 04/18/2018   Surgical resection of RLL  . Wears dentures    full upper and lower    PAST SURGICAL HISTORY :   Past Surgical History:  Procedure Laterality Date  . ABDOMINAL HYSTERECTOMY  1983  . BLADDER SUSPENSION  2014  . CATARACT EXTRACTION W/PHACO Left 05/11/2018   Procedure: CATARACT EXTRACTION PHACO AND INTRAOCULAR LENS PLACEMENT ([place]) LEFT;  Surgeon: Leandrew Koyanagi, MD;  Location: [facility];  Service: Ophthalmology;  Laterality: Left;  PREFERS EARLY  . CATARACT EXTRACTION W/PHACO Right 08/11/2018   Procedure: CATARACT EXTRACTION PHACO AND INTRAOCULAR LENS PLACEMENT ([facility]);  Surgeon: Leandrew Koyanagi, MD;  Location: [facility];  Service: Ophthalmology;  Laterality: Right;  CDE 5:02 Total Time 01:35 Fluid lot # [id number]  . CHOLECYSTECTOMY  1998  . COLONOSCOPY    . COLONOSCOPY WITH PROPOFOL N/A 04/28/2019   Procedure: COLONOSCOPY WITH PROPOFOL;  Surgeon: Jonathon Bellows, MD;  Location[facility] END[facility]COPY;  Service: Gastroenterology;  Laterality: N/A;  . ENDOBRONCHIAL ULTRASOUND N/A 03/20/2019   Procedure: ENDOBRONCHIAL ULTRASOUND;  Surgeon: Flora Lipps, MD;  Location: [facility];  Service: Cardiopulmonary;  Laterality: N/A;  . ESOPHAGOGASTRODUODENOSCOPY  2015  . ESOPHAGOGASTRODUODENOSCOPY (EGD) WITH PROPOFOL N/A  11/20/2016   Procedure: ESOPHAGOGASTRODUODENOSCOPY (EGD) WITH PROPOFOL;  Surgeon: Jonathon Bellows, MD;  Location: [facility] ENDOSCOPY;  Service: Endoscopy;  Laterality: N/A;  . ESOPHAGOGASTRODUODENOSCOPY (EGD) WITH PROPOFOL N/A 04/28/2019   Procedure: ESOPHAGOGASTRODUODENOSCOPY (EGD) WITH PROPOFOL;  Surgeon: Jonathon Bellows, MD;  Location: [facility] ENDOSCOPY;  Service: Gastroenterology;  Laterality: N/A;  Pt needs appointment after 9:30am  . EXCISION NEUROMA Bilateral 09/24/2014   Procedure: BILATERAL OPEN SAPHENOUS NEURECTOMIES AND OPEN LEFT PERIPATELLA OSETOPHYTECTOMY;  Surgeon: Mauri Pole, MD;  Location: [facility];  Service: Orthopedics;  Laterality: Bilateral;  . EYE SURGERY Left 05/11/2018   cataract extraction  . JOINT REPLACEMENT  2009/2010   BIL TOTAL KNEES  . JOINT REPLACEMENT  2020   left  . PORTACATH PLACEMENT N/A 04/05/2019   Procedure: INSERTION PORT-A-CATH;  Surgeon: Nestor Lewandowsky, MD;  Location: [facility];  Service: General;  Laterality: N/A;  . THORACOTOMY/LOBECTOMY Right 05/16/2018   Procedure: THORACOTOMY/POSSIBLE LOBECTOMY;  Surgeon: Nestor Lewandowsky, MD;  Location: [facility];  Service: Thoracic;  Laterality: Right;  . TONSILLECTOMY    . TOTAL KNEE REVISION Left 11/29/2018   Procedure: Left TOTAL KNEE REVISION;  Surgeon: Hessie Knows, MD;  Location: [facility];  Service: Orthopedics;  Laterality: Left;  .  VIDEO BRONCHOSCOPY N/A 05/16/2018   Procedure: PREOP  BRONCHOSCOPY;  Surgeon: Nestor Lewandowsky, MD;  Location: [facility];  Service: Thoracic;  Laterality: N/A;    FAMILY HISTORY :   Family History  Problem Relation Age of Onset  . Asthma Mother   . Diabetes Mother   . Hyperlipidemia Mother   . Hypertension Mother   . Cirrhosis Mother        Non-alcoholic  . Arthritis Father   . Cancer Father        Bone  . Hyperlipidemia Father   . Hypertension Father   . Anxiety disorder Sister   . Depression Sister   . Kidney Stones Son   . Stroke Maternal Grandmother   . Breast cancer Maternal  Grandmother 14  . Cancer Maternal Grandfather        lung  . Pneumonia Paternal Grandmother   . Alzheimer's disease Paternal Grandfather   . Diabetes Sister   . Gout Sister   . Hypertension Sister   . Alcohol abuse Brother   . Heart disease Brother        massive MI    SOCIAL HISTORY:   Social History   Tobacco Use  . Smoking status: Former Smoker    Packs/day: 0.50    Types: Cigarettes    Quit date: 04/07/2016    Years since quitting: 3.1  . Smokeless tobacco: Never Used  . Tobacco comment: patient has not smoked x 10 days.06/28/17 chantix  Substance Use Topics  . Alcohol use: No    Alcohol/week: 0.0 standard drinks  . Drug use: No    ALLERGIES:  is allergic to morphine and related; cymbalta [duloxetine hcl]; adhesive [tape]; and lexapro [escitalopram oxalate].  MEDICATIONS:  Current Outpatient Medications  Medication Sig Dispense Refill  . albuterol (PROVENTIL HFA;VENTOLIN HFA) 108 (90 Base) MCG/ACT inhaler Inhale 2 puffs into the lungs every 4 (four) hours as needed for wheezing or shortness of breath. 1 Inhaler 2  . albuterol (PROVENTIL) (2.5 MG/3ML) 0.083% nebulizer solution Take 3 mLs (2.5 mg total) by nebulization every 4 (four) hours as needed for wheezing or shortness of breath. 75 mL 12  . aspirin EC 81 MG tablet Take 81 mg by mouth daily.    Marland Kitchen aspirin-acetaminophen-caffeine (EXCEDRIN MIGRAINE) 250-250-65 MG tablet Take 1 tablet by mouth daily as needed for headache.    . clonazePAM (KLONOPIN) 0.5 MG tablet TAKE 1/2 TO 1 TABLET BY MOUTH DAILY AS NEEDED FOR ANXIETY. TRY TO LIMIT USE AND USE ONLY FOR SEVERE ANXIETY AND SLEEP PROBLEMS 25 tablet 1  . estradiol (ESTRACE) 1 MG tablet Take 1 mg by mouth daily.     Marland Kitchen FLUoxetine (PROZAC) 40 MG capsule Take 1 capsule (40 mg total) by mouth daily. To be combined with 20 mg 90 capsule 1  . hydrOXYzine (VISTARIL) 25 MG capsule TAKE 1 CAPSULE(25 MG) BY MOUTH TWICE DAILY AS NEEDED FOR SEVERE ANXIETY OR SYMPTOMS 60 capsule 1  .  lidocaine-prilocaine (EMLA) cream Apply to affected area once 30 g 3  . loperamide (IMODIUM) 2 MG capsule Take 2 mg by mouth as needed for diarrhea or loose stools.    . magic mouthwash SOLN Take 5 mLs by mouth 3 (three) times daily as needed for mouth pain. 240 mL 0  . omeprazole (PRILOSEC) 40 MG capsule Take 1 capsule (40 mg total) by mouth daily. (Patient taking differently: Take 40 mg by mouth every morning. ) 90 capsule 1  . ondansetron (ZOFRAN) 8 MG tablet Take 1  tablet (8 mg total) by mouth 2 (two) times daily as needed for refractory nausea / vomiting. Start on day 3 after chemo. 30 tablet 1  . oxyCODONE-acetaminophen (PERCOCET) 5-325 MG tablet Take 1 tablet by mouth every 8 (eight) hours as needed for severe pain. 45 tablet 0  . OXYGEN Inhale 2 L into the lungs at bedtime.    . pregabalin (LYRICA) 50 MG capsule Take 1 capsule (50 mg total) by mouth 3 (three) times daily. 90 capsule 2  . prochlorperazine (COMPAZINE) 10 MG tablet Take 1 tablet (10 mg total) by mouth every 6 (six) hours as needed (Nausea or vomiting). 30 tablet 1  . sucralfate (CARAFATE) 1 g tablet Take 1 tablet (1 g total) by mouth 3 (three) times daily. Dissolve in 3-4 tbsp warm water, swish and swallow. 90 tablet 3  . Suvorexant (BELSOMRA) 20 MG TABS Take 20 mg by mouth at bedtime. 30 tablet 2  . traMADol (ULTRAM) 50 MG tablet Take 1 tablet (50 mg total) by mouth every 8 (eight) hours as needed. 90 tablet 0  . FLUoxetine (PROZAC) 20 MG capsule Take 1 capsule (20 mg total) by mouth daily. To be combined with 40 mg (Patient not taking: Reported on 05/02/2019) 90 capsule 1  . guaiFENesin-codeine 100-10 MG/5ML syrup Take 10 mLs by mouth 3 (three) times daily as needed for cough. (Patient not taking: Reported on 05/17/2019) 120 mL 0  . levofloxacin (LEVAQUIN) 500 MG tablet Take 1 tablet (500 mg total) by mouth daily. (Patient not taking: Reported on 05/24/2019) 7 tablet 0  . nystatin cream (MYCOSTATIN) Apply 1 application topically 2  (two) times daily. (Patient not taking: Reported on 05/17/2019) 30 g 0   No current facility-administered medications for this visit.    Facility-Administered Medications Ordered in Other Visits  Medication Dose Route Frequency Provider Last Rate Last Dose  . CARBOplatin (PARAPLATIN) 220 mg in sodium chloride 0.9 % 250 mL chemo infusion  220 mg Intravenous Once Charlaine Dalton R, MD      . heparin lock flush 100 unit/mL  500 Units Intravenous Once Charlaine Dalton R, MD      . ipratropium-albuterol (DUONEB) 0.5-2.5 (3) MG/3ML nebulizer solution 3 mL  3 mL Nebulization Q6H Burns, Jennifer E, NP      . PACLitaxel (TAXOL) 90 mg in sodium chloride 0.9 % 250 mL chemo infusion (</= 80mg /m2)  45 mg/m2 (Treatment Plan Recorded) Intravenous Once Charlaine Dalton R, MD      . sodium chloride flush (NS) 0.9 % injection 10 mL  10 mL Intravenous PRN Cammie Sickle, MD        PHYSICAL EXAMINATION: ECOG PERFORMANCE STATUS: 1 - Symptomatic but completely ambulatory  BP 121/88   Pulse (!) 106   Temp 98.7 F (37.1 C)   Resp 18   Wt 189 lb 12.8 oz (86.1 kg)   SpO2 98%   BMI 34.71 kg/m   Filed Weights   05/24/19 1036  Weight: 189 lb 12.8 oz (86.1 kg)    Physical Exam  Constitutional: She is oriented to person, place, and time and well-developed, well-nourished, and in no distress.  She is alone.  She is walking herself.  Not on oxygen.  HENT:  Head: Normocephalic and atraumatic.  Mouth/Throat: Oropharynx is clear and moist. No oropharyngeal exudate.  Eyes: Pupils are equal, round, and reactive to light.  Neck: Normal range of motion. Neck supple.  Cardiovascular: Normal rate and regular rhythm.  Pulmonary/Chest: Effort normal and breath sounds normal.  No respiratory distress. She has no wheezes.  Abdominal: Soft. Bowel sounds are normal. She exhibits no distension and no mass. There is no abdominal tenderness. There is no rebound and no guarding.  Musculoskeletal: Normal range  of motion.        General: No tenderness or edema.  Neurological: She is alert and oriented to person, place, and time.  Skin: Skin is warm.  Mild erythema of the Mediport insertion site.  Improved.  Psychiatric: Affect normal.       LABORATORY DATA:  I have reviewed the data as listed    Component Value Date/Time   NA 135 05/24/2019 0937   NA 137 12/03/2017 1447   NA 137 05/14/2014 1457   K 4.3 05/24/2019 0937   K 3.2 (L) 05/14/2014 1457   CL 99 05/24/2019 0937   CL 103 05/14/2014 1457   CO2 25 05/24/2019 0937   CO2 28 05/14/2014 1457   GLUCOSE 104 (H) 05/24/2019 0937   GLUCOSE 80 05/14/2014 1457   BUN 16 05/24/2019 0937   BUN 8 12/03/2017 1447   BUN 8 05/14/2014 1457   CREATININE 1.04 (H) 05/24/2019 0937   CREATININE 0.79 05/14/2014 1457   CALCIUM 8.9 05/24/2019 0937   CALCIUM 8.9 05/14/2014 1457   PROT 7.8 05/24/2019 0937   PROT 6.8 12/03/2017 1447   PROT 7.0 05/14/2014 1457   ALBUMIN 3.6 05/24/2019 0937   ALBUMIN 3.8 12/03/2017 1447   ALBUMIN 3.3 (L) 05/14/2014 1457   AST 23 05/24/2019 0937   AST 9 (L) 05/14/2014 1457   ALT 13 05/24/2019 0937   ALT 15 05/14/2014 1457   ALKPHOS 86 05/24/2019 0937   ALKPHOS 87 05/14/2014 1457   BILITOT 0.4 05/24/2019 0937   BILITOT 0.6 12/03/2017 1447   BILITOT 0.2 05/14/2014 1457   GFRNONAA 59 (L) 05/24/2019 0937   GFRNONAA >60 05/14/2014 1457   GFRAA >60 05/24/2019 0937   GFRAA >60 05/14/2014 1457    No results found for: SPEP, UPEP  Lab Results  Component Value Date   WBC 1.8 (L) 05/24/2019   NEUTROABS 1.0 (L) 05/24/2019   HGB 10.3 (L) 05/24/2019   HCT 32.5 (L) 05/24/2019   MCV 85.1 05/24/2019   PLT 181 05/24/2019      Chemistry      Component Value Date/Time   NA 135 05/24/2019 0937   NA 137 12/03/2017 1447   NA 137 05/14/2014 1457   K 4.3 05/24/2019 0937   K 3.2 (L) 05/14/2014 1457   CL 99 05/24/2019 0937   CL 103 05/14/2014 1457   CO2 25 05/24/2019 0937   CO2 28 05/14/2014 1457   BUN 16 05/24/2019  0937   BUN 8 12/03/2017 1447   BUN 8 05/14/2014 1457   CREATININE 1.04 (H) 05/24/2019 0937   CREATININE 0.79 05/14/2014 1457      Component Value Date/Time   CALCIUM 8.9 05/24/2019 0937   CALCIUM 8.9 05/14/2014 1457   ALKPHOS 86 05/24/2019 0937   ALKPHOS 87 05/14/2014 1457   AST 23 05/24/2019 0937   AST 9 (L) 05/14/2014 1457   ALT 13 05/24/2019 0937   ALT 15 05/14/2014 1457   BILITOT 0.4 05/24/2019 0937   BILITOT 0.6 12/03/2017 1447   BILITOT 0.2 05/14/2014 1457       RADIOGRAPHIC STUDIES: I have personally reviewed the radiological images as listed and agreed with the findings in the report. No results found.   ASSESSMENT & PLAN:  Primary cancer of right lower lobe of  lung ([place]) #Recurrent squamous cell carcinoma status post biopsy of right hilar lymph node-stage III; currently on  carbotaxol weekly; along with radiation [until August 14].    #Proceed with # 5- carbo-Taxol. Labs today reviewed; ANC- 1.0; hb 10.3/ platlets-181  # Cellulitis of port incision s/p levaquin- improved.   # Left lingular inflammation/ infection-question radiation- improved.  Status post Levaquin.  # dysphagia-lower esophagus benign-appearing stricture ; however biopsy positive for dysplastic squamous cells.  Discussed with Dr. Vicente Males who kindly agrees to reschedule the endoscopy towards the end of August.  #  right chest wall pain- pleuritic; likely musculoskeletal. on Percocet every 8 hours.  Stable  # COPD- stable. Continue inhalers at home.  # insominia/anxiety- on trazadone/ seroqul; klonipin--stable.  # DISPOSITION:  # proceed with carbo-Taxol today # 1 week-MD carbo-Taxol/cbc/bmp-Dr.B     Orders Placed This Encounter  Procedures  . Basic metabolic panel    Standing Status:   Future    Standing Expiration Date:   05/23/2020   All questions were answered. The patient knows to call the clinic with any problems, questions or concerns.      Cammie Sickle, MD 05/24/2019 12:09  PM

## 2019-05-25 ENCOUNTER — Other Ambulatory Visit: Payer: Self-pay

## 2019-05-25 ENCOUNTER — Ambulatory Visit
Admission: RE | Admit: 2019-05-25 | Discharge: 2019-05-25 | Disposition: A | Discharge status: Home / Self Care | Payer: Medicare Other | Source: Ambulatory Visit | Attending: Radiation Oncology | Admitting: Radiation Oncology

## 2019-05-25 DIAGNOSIS — Z510 Encounter for antineoplastic radiation therapy: Secondary | ICD-10-CM | POA: Diagnosis not present

## 2019-05-25 DIAGNOSIS — C771 Secondary and unspecified malignant neoplasm of intrathoracic lymph nodes: Secondary | ICD-10-CM | POA: Diagnosis not present

## 2019-05-25 DIAGNOSIS — C3431 Malignant neoplasm of lower lobe, right bronchus or lung: Secondary | ICD-10-CM | POA: Diagnosis not present

## 2019-05-25 DIAGNOSIS — Z87891 Personal history of nicotine dependence: Secondary | ICD-10-CM | POA: Diagnosis not present

## 2019-05-26 ENCOUNTER — Ambulatory Visit: Payer: Medicare Other

## 2019-05-26 ENCOUNTER — Ambulatory Visit
Admission: RE | Admit: 2019-05-26 | Discharge: 2019-05-26 | Disposition: A | Discharge status: Home / Self Care | Payer: Medicare Other | Source: Ambulatory Visit | Attending: Radiation Oncology | Admitting: Radiation Oncology

## 2019-05-26 ENCOUNTER — Other Ambulatory Visit: Payer: Self-pay

## 2019-05-26 DIAGNOSIS — C3431 Malignant neoplasm of lower lobe, right bronchus or lung: Secondary | ICD-10-CM | POA: Diagnosis not present

## 2019-05-26 DIAGNOSIS — Z510 Encounter for antineoplastic radiation therapy: Secondary | ICD-10-CM | POA: Diagnosis not present

## 2019-05-26 DIAGNOSIS — Z87891 Personal history of nicotine dependence: Secondary | ICD-10-CM | POA: Diagnosis not present

## 2019-05-26 DIAGNOSIS — C771 Secondary and unspecified malignant neoplasm of intrathoracic lymph nodes: Secondary | ICD-10-CM | POA: Diagnosis not present

## 2019-05-29 ENCOUNTER — Ambulatory Visit
Admission: RE | Admit: 2019-05-29 | Discharge: 2019-05-29 | Disposition: A | Discharge status: Home / Self Care | Payer: Medicare Other | Source: Ambulatory Visit | Attending: Radiation Oncology | Admitting: Radiation Oncology

## 2019-05-29 ENCOUNTER — Ambulatory Visit: Payer: Medicare Other

## 2019-05-29 ENCOUNTER — Other Ambulatory Visit: Payer: Self-pay

## 2019-05-29 DIAGNOSIS — C771 Secondary and unspecified malignant neoplasm of intrathoracic lymph nodes: Secondary | ICD-10-CM | POA: Diagnosis not present

## 2019-05-29 DIAGNOSIS — Z96659 Presence of unspecified artificial knee joint: Secondary | ICD-10-CM | POA: Diagnosis not present

## 2019-05-29 DIAGNOSIS — C3431 Malignant neoplasm of lower lobe, right bronchus or lung: Secondary | ICD-10-CM | POA: Diagnosis not present

## 2019-05-29 DIAGNOSIS — Z87891 Personal history of nicotine dependence: Secondary | ICD-10-CM | POA: Diagnosis not present

## 2019-05-29 DIAGNOSIS — Z510 Encounter for antineoplastic radiation therapy: Secondary | ICD-10-CM | POA: Diagnosis not present

## 2019-05-29 DIAGNOSIS — C3490 Malignant neoplasm of unspecified part of unspecified bronchus or lung: Secondary | ICD-10-CM | POA: Diagnosis not present

## 2019-05-30 ENCOUNTER — Ambulatory Visit
Admission: RE | Admit: 2019-05-30 | Discharge: 2019-05-30 | Disposition: A | Discharge status: Home / Self Care | Payer: Medicare Other | Source: Ambulatory Visit | Attending: Radiation Oncology | Admitting: Radiation Oncology

## 2019-05-30 ENCOUNTER — Other Ambulatory Visit: Payer: Self-pay | Admitting: *Deleted

## 2019-05-30 ENCOUNTER — Other Ambulatory Visit: Payer: Self-pay

## 2019-05-30 ENCOUNTER — Encounter: Payer: Self-pay | Admitting: Psychiatry

## 2019-05-30 ENCOUNTER — Ambulatory Visit: Payer: Medicare Other

## 2019-05-30 ENCOUNTER — Ambulatory Visit (INDEPENDENT_AMBULATORY_CARE_PROVIDER_SITE_OTHER): Payer: Medicare Other | Admitting: Psychiatry

## 2019-05-30 DIAGNOSIS — C3431 Malignant neoplasm of lower lobe, right bronchus or lung: Secondary | ICD-10-CM

## 2019-05-30 DIAGNOSIS — F411 Generalized anxiety disorder: Secondary | ICD-10-CM

## 2019-05-30 DIAGNOSIS — Z510 Encounter for antineoplastic radiation therapy: Secondary | ICD-10-CM | POA: Diagnosis not present

## 2019-05-30 DIAGNOSIS — C771 Secondary and unspecified malignant neoplasm of intrathoracic lymph nodes: Secondary | ICD-10-CM | POA: Diagnosis not present

## 2019-05-30 DIAGNOSIS — F331 Major depressive disorder, recurrent, moderate: Secondary | ICD-10-CM | POA: Diagnosis not present

## 2019-05-30 DIAGNOSIS — F5101 Primary insomnia: Secondary | ICD-10-CM | POA: Diagnosis not present

## 2019-05-30 DIAGNOSIS — G3184 Mild cognitive impairment, so stated: Secondary | ICD-10-CM

## 2019-05-30 DIAGNOSIS — Z87891 Personal history of nicotine dependence: Secondary | ICD-10-CM | POA: Diagnosis not present

## 2019-05-30 MED ORDER — FLUOXETINE HCL 40 MG PO CAPS: 80.0000 mg | ORAL_CAPSULE | Freq: Every day | ORAL | 1 refills | Status: AC

## 2019-05-30 NOTE — Progress Notes (Signed)
Virtual Visit via Video Note  I connected with Susan Houston on 05/30/19 at  2:00 PM EDT by a video enabled telemedicine application and verified that I am speaking with the correct person using two identifiers.   I discussed the limitations of evaluation and management by telemedicine and the availability of in person appointments. The patient expressed understanding and agreed to proceed.   I discussed the assessment and treatment plan with the patient. The patient was provided an opportunity to ask questions and all were answered. The patient agreed with the plan and demonstrated an understanding of the instructions.   The patient was advised to call back or seek an in-person evaluation if the symptoms worsen or if the condition fails to improve as anticipated.   [place] MD OP Progress Note  05/30/2019 5:17 PM [name]  MRN:  [id number]  Chief Complaint:  Chief Complaint    Follow-up     HPI: [name] is a 58 year old Caucasian female, married, unemployed, lives in [place], has a history of MDD, GAD, primary insomnia, MCI, vitamin B12 deficiency, COPD, migraine headaches, degenerative disc disease, tremors, hyperlipidemia, lung cancer was evaluated by telemedicine today.  Patient had connection problem with video and it had to be changed to a phone call.  Patient continues to be in chemotherapy for her lung cancer.  She continues to struggle with fatigue, tiredness.  She also reports feeling anxious a lot.  She reports sleep is good on the Belsomra.  Patient denies any suicidality, homicidality or perceptual disturbances.  Discussed readjusting her medications to address her anxiety symptoms.  Patient appeared to be alert, oriented to person place time and situation. Visit Diagnosis:    ICD-10-CM   1. MDD (major depressive disorder), recurrent episode, moderate (HCC)  F33.1 FLUoxetine (PROZAC) 40 MG capsule  2. Primary insomnia  F51.01   3. GAD (generalized anxiety disorder)  F41.1  FLUoxetine (PROZAC) 40 MG capsule  4. MCI (mild cognitive impairment)  G31.84     Past Psychiatric History: I have reviewed past psychiatric history from my progress note on 01/14/2018.  Past trials of Lexapro, trazodone, Seroquel.  Past Medical History:  Past Medical History:  Diagnosis Date  . Anemia    vitamin b12 deficiency. no longer taking supplements  . Anxiety   . Arthritis   . Asthma   . Back pain   . Bruises easily   . Chronic leg pain    BILATERAL  . COPD (chronic obstructive pulmonary disease) (HCC)    no inhalers for over 1 year  . Depression   . Difficulty sleeping   . Diverticulitis   . Dyspnea    due to having part of lung removed in 2019  . Dysrhythmia 04/2018   brady episodes. cleared by dr. Clayborn Bigness  . Facet syndrome, lumbar 03/28/2015  . GERD (gastroesophageal reflux disease)   . Hemorrhoids   . History of kidney stones 04/2018   recently passed stone  . Hyperlipidemia   . Incontinence of urine   . Laryngeal cancer ([place]) 2009   LARYNX CANCER - CHEMO / RADIATION (NO SURGERY)   . Migraine   . Migraine without aura and without status migrainosus, not intractable 09/11/2016  . Mild cognitive impairment 11/23/2017  . MRSA infection greater than 3 months ago    2008 right side of face  . Nerve damage    right leg. thinks this is from her TKR  . Nodule of lower lobe of right lung 04/22/2018  . Oxygen  deficiency    2L/HS  . Primary cancer of right lower lobe of lung ([place]) 04/18/2018   Surgical resection of RLL  . Wears dentures    full upper and lower    Past Surgical History:  Procedure Laterality Date  . ABDOMINAL HYSTERECTOMY  1983  . BLADDER SUSPENSION  2014  . CATARACT EXTRACTION W/PHACO Left 05/11/2018   Procedure: CATARACT EXTRACTION PHACO AND INTRAOCULAR LENS PLACEMENT ([place]) LEFT;  Surgeon: Leandrew Koyanagi, MD;  Location: [facility];  Service: Ophthalmology;  Laterality: Left;  PREFERS EARLY  . CATARACT EXTRACTION W/PHACO Right  08/11/2018   Procedure: CATARACT EXTRACTION PHACO AND INTRAOCULAR LENS PLACEMENT (IOC);  Surgeon: Leandrew Koyanagi, MD;  Location: [facility];  Service: Ophthalmology;  Laterality: Right;  CDE 5:02 Total Time 01:35 Fluid lot # [id number]  . CHOLECYSTECTOMY  1998  . COLONOSCOPY    . COLONOSCOPY WITH PROPOFOL N/A 04/28/2019   Procedure: COLONOSCOPY WITH PROPOFOL;  Surgeon: Jonathon Bellows, MD;  Location: [facility];  Service: Gastroenterology;  Laterality: N/A;  . ENDOBRONCHIAL ULTRASOUND N/A 03/20/2019   Procedure: ENDOBRONCHIAL ULTRASOUND;  Surgeon: Flora Lipps, MD;  Location: [facility];  Service: Cardiopulmonary;  Laterality: N/A;  . ESOPHAGOGASTRODUODENOSCOPY  2015  . ESOPHAGOGASTRODUODENOSCOPY (EGD) WITH PROPOFOL N/A 11/20/2016   Procedure: ESOPHAGOGASTRODUODENOSCOPY (EGD) WITH PROPOFOL;  Surgeon: Jonathon Bellows, MD;  Location: [facility] ENDOSCOPY;  Service: Endoscopy;  Laterality: N/A;  . ESOPHAGOGASTRODUODENOSCOPY (EGD) WITH PROPOFOL N/A 04/28/2019   Procedure: ESOPHAGOGASTRODUODENOSCOPY (EGD) WITH PROPOFOL;  Surgeon: Jonathon Bellows, MD;  Location: [facility] ENDOSCOPY;  Service: Gastroenterology;  Laterality: N/A;  Pt needs appointment after 9:30am  . EXCISION NEUROMA Bilateral 09/24/2014   Procedure: BILATERAL OPEN SAPHENOUS NEURECTOMIES AND OPEN LEFT PERIPATELLA OSETOPHYTECTOMY;  Surgeon: Mauri Pole, MD;  Location: [facility];  Service: Orthopedics;  Laterality: Bilateral;  . EYE SURGERY Left 05/11/2018   cataract extraction  . JOINT REPLACEMENT  2009/2010   BIL TOTAL KNEES  . JOINT REPLACEMENT  2020   left  . PORTACATH PLACEMENT N/A 04/05/2019   Procedure: INSERTION PORT-A-CATH;  Surgeon: Nestor Lewandowsky, MD;  Location: [facility];  Service: General;  Laterality: N/A;  . THORACOTOMY/LOBECTOMY Right 05/16/2018   Procedure: THORACOTOMY/POSSIBLE LOBECTOMY;  Surgeon: Nestor Lewandowsky, MD;  Location: [facility];  Service: Thoracic;  Laterality: Right;  . TONSILLECTOMY    . TOTAL KNEE REVISION Left 11/29/2018    Procedure: Left TOTAL KNEE REVISION;  Surgeon: Hessie Knows, MD;  Location: [facility];  Service: Orthopedics;  Laterality: Left;  Marland Kitchen VIDEO BRONCHOSCOPY N/A 05/16/2018   Procedure: PREOP  BRONCHOSCOPY;  Surgeon: Nestor Lewandowsky, MD;  Location: [facility];  Service: Thoracic;  Laterality: N/A;    Family Psychiatric History: I have reviewed family psychiatric history from my progress note on 01/14/2018.  Family History:  Family History  Problem Relation Age of Onset  . Asthma Mother   . Diabetes Mother   . Hyperlipidemia Mother   . Hypertension Mother   . Cirrhosis Mother        Non-alcoholic  . Arthritis Father   . Cancer Father        Bone  . Hyperlipidemia Father   . Hypertension Father   . Anxiety disorder Sister   . Depression Sister   . Kidney Stones Son   . Stroke Maternal Grandmother   . Breast cancer Maternal Grandmother 18  . Cancer Maternal Grandfather        lung  . Pneumonia Paternal Grandmother   . Alzheimer's disease Paternal Grandfather   . Diabetes Sister   .  Gout Sister   . Hypertension Sister   . Alcohol abuse Brother   . Heart disease Brother        massive MI    Social History: I have reviewed social history from my progress note on 01/14/2018. Social History   Socioeconomic History  . Marital status: Married    Spouse name: Tharon Aquas  . Number of children: 2  . Years of education: 23  . Highest education level: 12th grade  Occupational History  . Occupation: Disabled  Social Needs  . Financial resource strain: Not hard at all  . Food insecurity    Worry: Never true    Inability: Never true  . Transportation needs    Medical: No    Non-medical: No  Tobacco Use  . Smoking status: Former Smoker    Packs/day: 0.50    Types: Cigarettes    Quit date: 04/07/2016    Years since quitting: 3.1  . Smokeless tobacco: Never Used  . Tobacco comment: patient has not smoked x 10 days.06/28/17 chantix  Substance and Sexual Activity  . Alcohol use: No     Alcohol/week: 0.0 standard drinks  . Drug use: No  . Sexual activity: Yes    Partners: Male    Birth control/protection: None  Lifestyle  . Physical activity    Days per week: 0 days    Minutes per session: 0 min  . Stress: Not at all  Relationships  . Social Herbalist on phone: Never    Gets together: Twice a week    Attends religious service: More than 4 times per year    Active member of club or organization: No    Attends meetings of clubs or organizations: Never    Relationship status: Married  Other Topics Concern  . Not on file  Social History Narrative   Lives at home w/ her husband   Right-handed   Caffeine: occasionally    Allergies:  Allergies  Allergen Reactions  . Morphine And Related Itching    Can tolerate with benadryl  . Cymbalta [Duloxetine Hcl] Other (See Comments)    Pt states it keeps her awake  . Adhesive [Tape] Other (See Comments)    Plastic tape rips skin and bruises her. PLEASE USE PAPER TAPE  . Lexapro [Escitalopram Oxalate] Other (See Comments)    Keeps awake for days.     Metabolic Disorder Labs: No results found for: HGBA1C, MPG No results found for: PROLACTIN Lab Results  Component Value Date   CHOL 169 12/03/2017   TRIG 126 12/03/2017   HDL 45 12/03/2017   LDLCALC 99 12/03/2017   LDLCALC 87 07/10/2016   Lab Results  Component Value Date   TSH 3.676 09/11/2018   TSH 3.16 05/21/2014    Therapeutic Level Labs: No results found for: LITHIUM No results found for: VALPROATE No components found for:  CBMZ  Current Medications: Current Outpatient Medications  Medication Sig Dispense Refill  . albuterol (PROVENTIL HFA;VENTOLIN HFA) 108 (90 Base) MCG/ACT inhaler Inhale 2 puffs into the lungs every 4 (four) hours as needed for wheezing or shortness of breath. 1 Inhaler 2  . albuterol (PROVENTIL) (2.5 MG/3ML) 0.083% nebulizer solution Take 3 mLs (2.5 mg total) by nebulization every 4 (four) hours as needed for wheezing  or shortness of breath. 75 mL 12  . aspirin EC 81 MG tablet Take 81 mg by mouth daily.    Marland Kitchen aspirin-acetaminophen-caffeine (EXCEDRIN MIGRAINE) 250-250-65 MG tablet Take 1 tablet by  mouth daily as needed for headache.    . clonazePAM (KLONOPIN) 0.5 MG tablet TAKE 1/2 TO 1 TABLET BY MOUTH DAILY AS NEEDED FOR ANXIETY. TRY TO LIMIT USE AND USE ONLY FOR SEVERE ANXIETY AND SLEEP PROBLEMS 25 tablet 1  . estradiol (ESTRACE) 1 MG tablet Take 1 mg by mouth daily.     Marland Kitchen FLUoxetine (PROZAC) 40 MG capsule Take 2 capsules (80 mg total) by mouth daily. 180 capsule 1  . guaiFENesin-codeine 100-10 MG/5ML syrup Take 10 mLs by mouth 3 (three) times daily as needed for cough. (Patient not taking: Reported on 05/17/2019) 120 mL 0  . hydrOXYzine (VISTARIL) 25 MG capsule TAKE 1 CAPSULE(25 MG) BY MOUTH TWICE DAILY AS NEEDED FOR SEVERE ANXIETY OR SYMPTOMS 60 capsule 1  . levofloxacin (LEVAQUIN) 500 MG tablet Take 1 tablet (500 mg total) by mouth daily. (Patient not taking: Reported on 05/24/2019) 7 tablet 0  . lidocaine-prilocaine (EMLA) cream Apply to affected area once 30 g 3  . loperamide (IMODIUM) 2 MG capsule Take 2 mg by mouth as needed for diarrhea or loose stools.    . magic mouthwash SOLN Take 5 mLs by mouth 3 (three) times daily as needed for mouth pain. 240 mL 0  . nystatin cream (MYCOSTATIN) Apply 1 application topically 2 (two) times daily. (Patient not taking: Reported on 05/17/2019) 30 g 0  . omeprazole (PRILOSEC) 40 MG capsule Take 1 capsule (40 mg total) by mouth daily. (Patient taking differently: Take 40 mg by mouth every morning. ) 90 capsule 1  . ondansetron (ZOFRAN) 8 MG tablet Take 1 tablet (8 mg total) by mouth 2 (two) times daily as needed for refractory nausea / vomiting. Start on day 3 after chemo. 30 tablet 1  . oxyCODONE-acetaminophen (PERCOCET) 5-325 MG tablet Take 1 tablet by mouth every 8 (eight) hours as needed for severe pain. 45 tablet 0  . OXYGEN Inhale 2 L into the lungs at bedtime.    .  pregabalin (LYRICA) 50 MG capsule Take 1 capsule (50 mg total) by mouth 3 (three) times daily. 90 capsule 2  . prochlorperazine (COMPAZINE) 10 MG tablet Take 1 tablet (10 mg total) by mouth every 6 (six) hours as needed (Nausea or vomiting). 30 tablet 1  . sucralfate (CARAFATE) 1 g tablet Take 1 tablet (1 g total) by mouth 3 (three) times daily. Dissolve in 3-4 tbsp warm water, swish and swallow. 90 tablet 3  . Suvorexant (BELSOMRA) 20 MG TABS Take 20 mg by mouth at bedtime. 30 tablet 2  . traMADol (ULTRAM) 50 MG tablet Take 1 tablet (50 mg total) by mouth every 8 (eight) hours as needed. 90 tablet 0   No current facility-administered medications for this visit.    Facility-Administered Medications Ordered in Other Visits  Medication Dose Route Frequency Provider Last Rate Last Dose  . ipratropium-albuterol (DUONEB) 0.5-2.5 (3) MG/3ML nebulizer solution 3 mL  3 mL Nebulization Q6H Burns, Wandra Feinstein, NP         Musculoskeletal: Strength & Muscle Tone: UTA Gait & Station: UTA Patient leans: N/A  Psychiatric Specialty Exam: Review of Systems  Psychiatric/Behavioral: Positive for depression. The patient is nervous/anxious.   All other systems reviewed and are negative.   There were no vitals taken for this visit.There is no height or weight on file to calculate BMI.  General Appearance: UTA  Eye Contact:  UTA  Speech:  Clear and Coherent  Volume:  Decreased  Mood:  Anxious and Depressed  Affect:  UTA  Thought Process:  Goal Directed and Descriptions of Associations: Intact  Orientation:  Full (Time, Place, and Person)  Thought Content: Logical   Suicidal Thoughts:  No  Homicidal Thoughts:  No  Memory:  Immediate;   Fair Recent;   Fair Remote;   Fair  Judgement:  Fair  Insight:  Fair  Psychomotor Activity:  UTA  Concentration:  Concentration: Fair and Attention Span: Fair  Recall:  [employer] of Knowledge: Fair  Language: Fair  Akathisia:  No  Handed:  Right  AIMS (if  indicated): UTA  Assets:  Communication Skills Desire for Improvement Housing Social Support  ADL's:  Intact  Cognition: WNL  Sleep:  Restless   Screenings: GAD-7     Office Visit from 10/25/2018 in [place] Visit from 09/16/2018 in [facility] Visit from 07/13/2018 in [place] Visit from 06/28/2018 in [facility] Visit from 04/15/2018 in [place]  Total GAD-7 Score  9  9  19  6  8     PHQ2-9     Procedure visit from 11/15/2018 in [facility] Office Visit from 11/09/2018 in [place] Procedure visit from 10/27/2018 in [place] Office Visit from 10/25/2018 in [place] Procedure visit from 09/29/2018 in [place]  PHQ-2 Total Score  0  2  1  1   0  PHQ-9 Total Score  -  8  -  7  5       Assessment and Plan: Paisly is a 58 year old Caucasian female, has a history of MDD, GAD, tremors, migraine headaches, lung cancer, vitamin B12 deficiency, MCI, history of knee replacement was evaluated by telemedicine today.  Patient continues to struggle with depression as well as anxiety symptoms more so because of her current health issues and her chemotherapy.  Patient will continue to benefit from medication readjustment.  Plan For MDD- some progress Increase Prozac to 80 mg p.o. daily Patient was referred for CBT however has been noncompliant  For GAD- some progress Increase Prozac as discussed. Continue Klonopin 0.25-0.5 mg p.o. daily PRN for severe anxiety attacks  For insomnia- improving Belsomra 20 mg p.o. nightly. Klonopin 0.25-0.5 mg p.o. daily PRN for anxiety symptoms at night.  For MCI-she will continue to work with neurology.  Follow-up in clinic in 1 to 2 months or sooner if needed.  September 30 at  4:45 PM  I have spent atleast 15 minutes non face to face with patient today. More than 50 % of the time was spent for psychoeducation and supportive psychotherapy and care coordination.  This note was generated in part or whole with voice recognition software. Voice recognition is usually quite accurate but there are transcription errors that can and very often do occur. I apologize for any typographical errors that were not detected and corrected.          Ursula Alert, MD 05/30/2019, 5:17 PM

## 2019-05-31 ENCOUNTER — Inpatient Hospital Stay: Payer: Medicare Other

## 2019-05-31 ENCOUNTER — Other Ambulatory Visit: Payer: Self-pay

## 2019-05-31 ENCOUNTER — Ambulatory Visit: Payer: Medicare Other

## 2019-05-31 ENCOUNTER — Encounter: Payer: Self-pay | Admitting: Internal Medicine

## 2019-05-31 ENCOUNTER — Inpatient Hospital Stay (HOSPITAL_BASED_OUTPATIENT_CLINIC_OR_DEPARTMENT_OTHER): Payer: Medicare Other | Admitting: Internal Medicine

## 2019-05-31 ENCOUNTER — Ambulatory Visit
Admission: RE | Admit: 2019-05-31 | Discharge: 2019-05-31 | Disposition: A | Discharge status: Home / Self Care | Payer: Medicare Other | Source: Ambulatory Visit | Attending: Radiation Oncology | Admitting: Radiation Oncology

## 2019-05-31 DIAGNOSIS — R05 Cough: Secondary | ICD-10-CM | POA: Diagnosis not present

## 2019-05-31 DIAGNOSIS — C3431 Malignant neoplasm of lower lobe, right bronchus or lung: Secondary | ICD-10-CM

## 2019-05-31 DIAGNOSIS — Z5111 Encounter for antineoplastic chemotherapy: Secondary | ICD-10-CM | POA: Diagnosis not present

## 2019-05-31 DIAGNOSIS — Z8249 Family history of ischemic heart disease and other diseases of the circulatory system: Secondary | ICD-10-CM | POA: Diagnosis not present

## 2019-05-31 DIAGNOSIS — J449 Chronic obstructive pulmonary disease, unspecified: Secondary | ICD-10-CM | POA: Diagnosis not present

## 2019-05-31 DIAGNOSIS — Z510 Encounter for antineoplastic radiation therapy: Secondary | ICD-10-CM | POA: Diagnosis not present

## 2019-05-31 DIAGNOSIS — Z79899 Other long term (current) drug therapy: Secondary | ICD-10-CM | POA: Diagnosis not present

## 2019-05-31 DIAGNOSIS — R1310 Dysphagia, unspecified: Secondary | ICD-10-CM | POA: Diagnosis not present

## 2019-05-31 DIAGNOSIS — Z823 Family history of stroke: Secondary | ICD-10-CM | POA: Diagnosis not present

## 2019-05-31 DIAGNOSIS — Z885 Allergy status to narcotic agent status: Secondary | ICD-10-CM | POA: Diagnosis not present

## 2019-05-31 DIAGNOSIS — Z87891 Personal history of nicotine dependence: Secondary | ICD-10-CM | POA: Diagnosis not present

## 2019-05-31 DIAGNOSIS — Z841 Family history of disorders of kidney and ureter: Secondary | ICD-10-CM | POA: Diagnosis not present

## 2019-05-31 DIAGNOSIS — R5383 Other fatigue: Secondary | ICD-10-CM | POA: Diagnosis not present

## 2019-05-31 DIAGNOSIS — C329 Malignant neoplasm of larynx, unspecified: Secondary | ICD-10-CM | POA: Diagnosis not present

## 2019-05-31 DIAGNOSIS — Z83438 Family history of other disorder of lipoprotein metabolism and other lipidemia: Secondary | ICD-10-CM | POA: Diagnosis not present

## 2019-05-31 DIAGNOSIS — R079 Chest pain, unspecified: Secondary | ICD-10-CM | POA: Diagnosis not present

## 2019-05-31 DIAGNOSIS — Z8379 Family history of other diseases of the digestive system: Secondary | ICD-10-CM | POA: Diagnosis not present

## 2019-05-31 DIAGNOSIS — Z825 Family history of asthma and other chronic lower respiratory diseases: Secondary | ICD-10-CM | POA: Diagnosis not present

## 2019-05-31 DIAGNOSIS — Z833 Family history of diabetes mellitus: Secondary | ICD-10-CM | POA: Diagnosis not present

## 2019-05-31 DIAGNOSIS — R21 Rash and other nonspecific skin eruption: Secondary | ICD-10-CM | POA: Diagnosis not present

## 2019-05-31 DIAGNOSIS — Z95828 Presence of other vascular implants and grafts: Secondary | ICD-10-CM

## 2019-05-31 DIAGNOSIS — Z888 Allergy status to other drugs, medicaments and biological substances status: Secondary | ICD-10-CM | POA: Diagnosis not present

## 2019-05-31 DIAGNOSIS — Z8261 Family history of arthritis: Secondary | ICD-10-CM | POA: Diagnosis not present

## 2019-05-31 DIAGNOSIS — R0789 Other chest pain: Secondary | ICD-10-CM | POA: Diagnosis not present

## 2019-05-31 DIAGNOSIS — C771 Secondary and unspecified malignant neoplasm of intrathoracic lymph nodes: Secondary | ICD-10-CM | POA: Diagnosis not present

## 2019-05-31 DIAGNOSIS — R0602 Shortness of breath: Secondary | ICD-10-CM | POA: Diagnosis not present

## 2019-05-31 LAB — CBC WITH DIFFERENTIAL/PLATELET
Abs Immature Granulocytes: 0.02 10*3/uL (ref 0.00–0.07)
Basophils Absolute: 0 10*3/uL (ref 0.0–0.1)
Basophils Relative: 1 %
Eosinophils Absolute: 0 10*3/uL (ref 0.0–0.5)
Eosinophils Relative: 2 %
HCT: 28.1 % — ABNORMAL LOW (ref 36.0–46.0)
Hemoglobin: 9 g/dL — ABNORMAL LOW (ref 12.0–15.0)
Immature Granulocytes: 1 %
Lymphocytes Relative: 12 %
Lymphs Abs: 0.2 10*3/uL — ABNORMAL LOW (ref 0.7–4.0)
MCH: 27.1 pg (ref 26.0–34.0)
MCHC: 32 g/dL (ref 30.0–36.0)
MCV: 84.6 fL (ref 80.0–100.0)
Monocytes Absolute: 0.2 10*3/uL (ref 0.1–1.0)
Monocytes Relative: 14 %
Neutro Abs: 1 10*3/uL — ABNORMAL LOW (ref 1.7–7.7)
Neutrophils Relative %: 70 %
Platelets: 106 10*3/uL — ABNORMAL LOW (ref 150–400)
RBC: 3.32 MIL/uL — ABNORMAL LOW (ref 3.87–5.11)
RDW: 19 % — ABNORMAL HIGH (ref 11.5–15.5)
WBC: 1.5 10*3/uL — ABNORMAL LOW (ref 4.0–10.5)
nRBC: 0 % (ref 0.0–0.2)

## 2019-05-31 LAB — BASIC METABOLIC PANEL
Anion gap: 9 (ref 5–15)
BUN: 15 mg/dL (ref 6–20)
CO2: 25 mmol/L (ref 22–32)
Calcium: 8.4 mg/dL — ABNORMAL LOW (ref 8.9–10.3)
Chloride: 101 mmol/L (ref 98–111)
Creatinine, Ser: 0.64 mg/dL (ref 0.44–1.00)
GFR calc Af Amer: >60 mL/min (ref >60)
GFR calc non Af Amer: >60 mL/min (ref >60)
Glucose, Bld: 100 mg/dL — ABNORMAL HIGH (ref 70–99)
Potassium: 3.8 mmol/L (ref 3.5–5.1)
Sodium: 135 mmol/L (ref 135–145)

## 2019-05-31 MED ORDER — TRAMADOL HCL 50 MG PO TABS: 50.0000 mg | ORAL_TABLET | Freq: Three times a day (TID) | ORAL | 0 refills | Status: AC | PRN

## 2019-05-31 MED ORDER — SODIUM CHLORIDE 0.9 % IV SOLN
45.0000 mg/m2 | Freq: Once | INTRAVENOUS | Status: AC
  Administered 2019-05-31: 11:00:00 90 mg via INTRAVENOUS
  Filled 2019-05-31: qty 15

## 2019-05-31 MED ORDER — SODIUM CHLORIDE 0.9 % IV SOLN
20.0000 mg | Freq: Once | INTRAVENOUS | Status: AC
  Administered 2019-05-31: 20 mg via INTRAVENOUS
  Filled 2019-05-31: qty 2

## 2019-05-31 MED ORDER — SODIUM CHLORIDE 0.9 % IV SOLN
265.8000 mg | Freq: Once | INTRAVENOUS | Status: AC
  Administered 2019-05-31: 270 mg via INTRAVENOUS
  Filled 2019-05-31: qty 27

## 2019-05-31 MED ORDER — HEPARIN SOD (PORK) LOCK FLUSH 100 UNIT/ML IV SOLN
500.0000 [IU] | Freq: Once | INTRAVENOUS | Status: AC | PRN
  Administered 2019-05-31: 500 [IU]
  Filled 2019-05-31: qty 5

## 2019-05-31 MED ORDER — SODIUM CHLORIDE 0.9% FLUSH
10.0000 mL | Freq: Once | INTRAVENOUS | Status: AC
  Administered 2019-05-31: 10 mL via INTRAVENOUS
  Filled 2019-05-31: qty 10

## 2019-05-31 MED ORDER — DIPHENHYDRAMINE HCL 50 MG/ML IJ SOLN
50.0000 mg | Freq: Once | INTRAMUSCULAR | Status: AC
  Administered 2019-05-31: 11:00:00 50 mg via INTRAVENOUS
  Filled 2019-05-31: qty 1

## 2019-05-31 MED ORDER — GUAIFENESIN-CODEINE 100-10 MG/5ML PO SOLN: 10.0000 mL | Freq: Three times a day (TID) | ORAL | 0 refills | Status: DC | PRN

## 2019-05-31 MED ORDER — FAMOTIDINE IN NACL 20-0.9 MG/50ML-% IV SOLN
20.0000 mg | Freq: Once | INTRAVENOUS | Status: AC
  Administered 2019-05-31: 20 mg via INTRAVENOUS
  Filled 2019-05-31: qty 50

## 2019-05-31 MED ORDER — PALONOSETRON HCL INJECTION 0.25 MG/5ML
0.2500 mg | Freq: Once | INTRAVENOUS | Status: AC
  Administered 2019-05-31: 0.25 mg via INTRAVENOUS
  Filled 2019-05-31: qty 5

## 2019-05-31 MED ORDER — SODIUM CHLORIDE 0.9 % IV SOLN
Freq: Once | INTRAVENOUS | Status: AC
  Administered 2019-05-31: 11:00:00 via INTRAVENOUS
  Filled 2019-05-31: qty 250

## 2019-05-31 NOTE — Assessment & Plan Note (Addendum)
#  Recurrent squamous cell carcinoma status post biopsy of right hilar lymph node-stage III; currently on  carbotaxol weekly; along with radiation [until August 14].    #Proceed with # 6- carbo-Taxol. Labs today reviewed; ANC- 1.0; hb-9.0/ platlets-106.  Discussed that we will plan to get a CT scan in approximately 4 to 6 weeks post treatment.  Will order CT scan at next visit.  # Cellulitis of port incision s/p levaquin- improved.   # Left lingular inflammation/ infection-question radiation- improved.  Status post Levaquin.  # dysphagia-lower esophagus benign-appearing stricture ; however biopsy positive for dysplastic squamous cells.  Hold off repeat endoscopy at this time.  #  right chest wall pain- pleuritic; likely musculoskeletal. on Percocet every 8 hours.  Prescription tramadol given.  Stable.  # COPD- stable. Continue inhalers at home.  # insominia/anxiety- on trazadone/ seroqul; klonipin--stable  # DISPOSITION:  # proceed with carbo-Taxol today # 2 week-MD cbc/cmp/ no chemo-Dr.B

## 2019-05-31 NOTE — Progress Notes (Signed)
+Susan [facility] OFFICE PROGRESS NOTE  Patient Care Team: Valerie Roys, DO as PCP - General (Family Medicine) Anabel Bene, MD as Referring Physician (Neurology) Telford Nab, RN as Registered Nurse  Cancer Staging No matching staging information was found for the patient.   Oncology History Overview Note  # 2011-laryngeal cancer/SCC; Stage II; April-June 2011 s/p  CRT [Dr.Choski/Bennett]  # 2019- likely STAGE I 10 mm RLL nodule [incidental]; s/p wedge resection-July 2019-stage I squamous cell lung cancer. NO  Adjuvant therapy.  # May 28th 2020-stage III lung cancer-recurrent  RIGHT HILAR-- POSITIVE FOR MALIGNANT NEOPLASTIC CELLS. - CONSISTENT WITH METASTATIC SQUAMOUS CELL CARCINOMA.  #June 2020- Carbo-taxol- with RT  # COPD  # post thoracotomy syndrome  # EGD [Dr.Anna]- July,2020 -" dysplastic squamous cells" awaiting repeating EGD end of August-early September  DIAGNOSIS: RLL lung ca  STAGE:  III ; GOALS: cure  CURRENT/MOST RECENT THERAPY: Carbo-taxol- RT    Laryngeal cancer ([place])  Primary cancer of right lower lobe of lung ([place])  05/27/2018 Initial Diagnosis   Primary cancer of right lower lobe of lung ([place])   04/17/2019 -  Chemotherapy   The patient had palonosetron (ALOXI) injection 0.25 mg, 0.25 mg, Intravenous,  Once, 6 of 8 cycles Administration: 0.25 mg (04/17/2019), 0.25 mg (05/24/2019), 0.25 mg (04/24/2019), 0.25 mg (05/02/2019), 0.25 mg (05/10/2019) CARBOplatin (PARAPLATIN) 270 mg in sodium chloride 0.9 % 250 mL chemo infusion, 270 mg (100 % of original dose 265.8 mg), Intravenous,  Once, 6 of 8 cycles Dose modification:   (original dose 265.8 mg, Cycle 1) Administration: 270 mg (04/17/2019), 220 mg (05/24/2019), 270 mg (04/24/2019), 270 mg (05/02/2019), 270 mg (05/10/2019) PACLitaxel (TAXOL) 90 mg in sodium chloride 0.9 % 250 mL chemo infusion (</= 80mg /m2), 45 mg/m2 = 90 mg, Intravenous,  Once, 6 of 8 cycles Administration: 90 mg (04/17/2019), 90 mg  (05/24/2019), 90 mg (04/24/2019), 90 mg (05/02/2019), 90 mg (05/10/2019)  for chemotherapy treatment.      INTERVAL HISTORY:  Susan Houston 58 y.o.  female recurrent stage III squamous lung cancer -currently on carbotaxol with radiation is here for follow-up.  Patient was recently treated with antibiotics for her port incision cellulitis/CT scan suggestive of bronchopneumonia.   Her breathing is improved.  She continues have mild cough.  Continues to have chronic chest wall pain.  Not any worse.  Review of Systems  Constitutional: Positive for malaise/fatigue. Negative for chills, diaphoresis, fever and weight loss.  HENT: Negative for nosebleeds and sore throat.   Eyes: Negative for double vision.  Respiratory: Negative for hemoptysis, sputum production and wheezing.   Cardiovascular: Positive for chest pain. Negative for palpitations, orthopnea and leg swelling.  Gastrointestinal: Negative for abdominal pain, blood in stool, heartburn, melena, nausea and vomiting.  Genitourinary: Negative for dysuria, frequency and urgency.  Musculoskeletal: Negative for back pain and joint pain.  Skin: Negative for itching.  Neurological: Negative for dizziness, tingling, focal weakness, weakness and headaches.  Endo/Heme/Allergies: Does not bruise/bleed easily.  Psychiatric/Behavioral: Negative for depression. The patient is nervous/anxious and has insomnia.     PAST MEDICAL HISTORY :  Past Medical History:  Diagnosis Date  . Anemia    vitamin b12 deficiency. no longer taking supplements  . Anxiety   . Arthritis   . Asthma   . Back pain   . Bruises easily   . Chronic leg pain    BILATERAL  . COPD (chronic obstructive pulmonary disease) (HCC)    no inhalers for over 1  year  . Depression   . Difficulty sleeping   . Diverticulitis   . Dyspnea    due to having part of lung removed in 2019  . Dysrhythmia 04/2018   brady episodes. cleared by dr. Clayborn Bigness  . Facet syndrome, lumbar 03/28/2015  .  GERD (gastroesophageal reflux disease)   . Hemorrhoids   . History of kidney stones 04/2018   recently passed stone  . Hyperlipidemia   . Incontinence of urine   . Laryngeal cancer ([place]) 2009   LARYNX CANCER - CHEMO / RADIATION (NO SURGERY)   . Migraine   . Migraine without aura and without status migrainosus, not intractable 09/11/2016  . Mild cognitive impairment 11/23/2017  . MRSA infection greater than 3 months ago    2008 right side of face  . Nerve damage    right leg. thinks this is from her TKR  . Nodule of lower lobe of right lung 04/22/2018  . Oxygen deficiency    2L/HS  . Primary cancer of right lower lobe of lung ([place]) 04/18/2018   Surgical resection of RLL  . Wears dentures    full upper and lower    PAST SURGICAL HISTORY :   Past Surgical History:  Procedure Laterality Date  . ABDOMINAL HYSTERECTOMY  1983  . BLADDER SUSPENSION  2014  . CATARACT EXTRACTION W/PHACO Left 05/11/2018   Procedure: CATARACT EXTRACTION PHACO AND INTRAOCULAR LENS PLACEMENT ([place]) LEFT;  Surgeon: Leandrew Koyanagi, MD;  Location: [facility];  Service: Ophthalmology;  Laterality: Left;  PREFERS EARLY  . CATARACT EXTRACTION W/PHACO Right 08/11/2018   Procedure: CATARACT EXTRACTION PHACO AND INTRAOCULAR LENS PLACEMENT ([facility]);  Surgeon: Leandrew Koyanagi, MD;  Location: [facility];  Service: Ophthalmology;  Laterality: Right;  CDE 5:02 Total Time 01:35 Fluid lot # [id number]  . CHOLECYSTECTOMY  1998  . COLONOSCOPY    . COLONOSCOPY WITH PROPOFOL N/A 04/28/2019   Procedure: COLONOSCOPY WITH PROPOFOL;  Surgeon: Jonathon Bellows, MD;  Location: [facility] ENDOSCOPY;  Service: Gastroenterology;  Laterality: N/A;  . ENDOBRONCHIAL ULTRASOUND N/A 03/20/2019   Procedure: ENDOBRONCHIAL ULTRASOUND;  Surgeon: Flora Lipps, MD;  Location: [facility];  Service: Cardiopulmonary;  Laterality: N/A;  . ESOPHAGOGASTRODUODENOSCOPY  2015  . ESOPHAGOGASTRODUODENOSCOPY (EGD) WITH PROPOFOL N/A 11/20/2016   Procedure:  ESOPHAGOGASTRODUODENOSCOPY (EGD) WITH PROPOFOL;  Surgeon: Jonathon Bellows, MD;  Location: [facility] ENDOSCOPY;  Service: Endoscopy;  Laterality: N/A;  . ESOPHAGOGASTRODUODENOSCOPY (EGD) WITH PROPOFOL N/A 04/28/2019   Procedure: ESOPHAGOGASTRODUODENOSCOPY (EGD) WITH PROPOFOL;  Surgeon: Jonathon Bellows, MD;  Location: [facility] ENDOSCOPY;  Service: Gastroenterology;  Laterality: N/A;  Pt needs appointment after 9:30am  . EXCISION NEUROMA Bilateral 09/24/2014   Procedure: BILATERAL OPEN SAPHENOUS NEURECTOMIES AND OPEN LEFT PERIPATELLA OSETOPHYTECTOMY;  Surgeon: Mauri Pole, MD;  Location: [facility];  Service: Orthopedics;  Laterality: Bilateral;  . EYE SURGERY Left 05/11/2018   cataract extraction  . JOINT REPLACEMENT  2009/2010   BIL TOTAL KNEES  . JOINT REPLACEMENT  2020   left  . PORTACATH PLACEMENT N/A 04/05/2019   Procedure: INSERTION PORT-A-CATH;  Surgeon: Nestor Lewandowsky, MD;  Location: [facility];  Service: General;  Laterality: N/A;  . THORACOTOMY/LOBECTOMY Right 05/16/2018   Procedure: THORACOTOMY/POSSIBLE LOBECTOMY;  Surgeon: Nestor Lewandowsky, MD;  Location: [facility];  Service: Thoracic;  Laterality: Right;  . TONSILLECTOMY    . TOTAL KNEE REVISION Left 11/29/2018   Procedure: Left TOTAL KNEE REVISION;  Surgeon: Hessie Knows, MD;  Location: [facility];  Service: Orthopedics;  Laterality: Left;  Marland Kitchen VIDEO BRONCHOSCOPY N/A 05/16/2018  Procedure: PREOP  BRONCHOSCOPY;  Surgeon: Nestor Lewandowsky, MD;  Location: [facility];  Service: Thoracic;  Laterality: N/A;    FAMILY HISTORY :   Family History  Problem Relation Age of Onset  . Asthma Mother   . Diabetes Mother   . Hyperlipidemia Mother   . Hypertension Mother   . Cirrhosis Mother        Non-alcoholic  . Arthritis Father   . Cancer Father        Bone  . Hyperlipidemia Father   . Hypertension Father   . Anxiety disorder Sister   . Depression Sister   . Kidney Stones Son   . Stroke Maternal Grandmother   . Breast cancer Maternal Grandmother 83  . Cancer  Maternal Grandfather        lung  . Pneumonia Paternal Grandmother   . Alzheimer's disease Paternal Grandfather   . Diabetes Sister   . Gout Sister   . Hypertension Sister   . Alcohol abuse Brother   . Heart disease Brother        massive MI    SOCIAL HISTORY:   Social History   Tobacco Use  . Smoking status: Former Smoker    Packs/day: 0.50    Types: Cigarettes    Quit date: 04/07/2016    Years since quitting: 3.1  . Smokeless tobacco: Never Used  . Tobacco comment: patient has not smoked x 10 days.06/28/17 chantix  Substance Use Topics  . Alcohol use: No    Alcohol/week: 0.0 standard drinks  . Drug use: No    ALLERGIES:  is allergic to morphine and related; cymbalta [duloxetine hcl]; adhesive [tape]; and lexapro [escitalopram oxalate].  MEDICATIONS:  Current Outpatient Medications  Medication Sig Dispense Refill  . albuterol (PROVENTIL HFA;VENTOLIN HFA) 108 (90 Base) MCG/ACT inhaler Inhale 2 puffs into the lungs every 4 (four) hours as needed for wheezing or shortness of breath. 1 Inhaler 2  . albuterol (PROVENTIL) (2.5 MG/3ML) 0.083% nebulizer solution Take 3 mLs (2.5 mg total) by nebulization every 4 (four) hours as needed for wheezing or shortness of breath. 75 mL 12  . aspirin EC 81 MG tablet Take 81 mg by mouth daily.    Marland Kitchen aspirin-acetaminophen-caffeine (EXCEDRIN MIGRAINE) 250-250-65 MG tablet Take 1 tablet by mouth daily as needed for headache.    . clonazePAM (KLONOPIN) 0.5 MG tablet TAKE 1/2 TO 1 TABLET BY MOUTH DAILY AS NEEDED FOR ANXIETY. TRY TO LIMIT USE AND USE ONLY FOR SEVERE ANXIETY AND SLEEP PROBLEMS 25 tablet 1  . estradiol (ESTRACE) 1 MG tablet Take 1 mg by mouth daily.     Marland Kitchen FLUoxetine (PROZAC) 40 MG capsule Take 2 capsules (80 mg total) by mouth daily. 180 capsule 1  . hydrOXYzine (VISTARIL) 25 MG capsule TAKE 1 CAPSULE(25 MG) BY MOUTH TWICE DAILY AS NEEDED FOR SEVERE ANXIETY OR SYMPTOMS 60 capsule 1  . lidocaine-prilocaine (EMLA) cream Apply to affected  area once 30 g 3  . loperamide (IMODIUM) 2 MG capsule Take 2 mg by mouth as needed for diarrhea or loose stools.    . magic mouthwash SOLN Take 5 mLs by mouth 3 (three) times daily as needed for mouth pain. 240 mL 0  . omeprazole (PRILOSEC) 40 MG capsule Take 1 capsule (40 mg total) by mouth daily. (Patient taking differently: Take 40 mg by mouth every morning. ) 90 capsule 1  . ondansetron (ZOFRAN) 8 MG tablet Take 1 tablet (8 mg total) by mouth 2 (two) times daily as needed  for refractory nausea / vomiting. Start on day 3 after chemo. 30 tablet 1  . oxyCODONE-acetaminophen (PERCOCET) 5-325 MG tablet Take 1 tablet by mouth every 8 (eight) hours as needed for severe pain. 45 tablet 0  . OXYGEN Inhale 2 L into the lungs at bedtime.    . pregabalin (LYRICA) 50 MG capsule Take 1 capsule (50 mg total) by mouth 3 (three) times daily. 90 capsule 2  . prochlorperazine (COMPAZINE) 10 MG tablet Take 1 tablet (10 mg total) by mouth every 6 (six) hours as needed (Nausea or vomiting). 30 tablet 1  . sucralfate (CARAFATE) 1 g tablet Take 1 tablet (1 g total) by mouth 3 (three) times daily. Dissolve in 3-4 tbsp warm water, swish and swallow. 90 tablet 3  . Suvorexant (BELSOMRA) 20 MG TABS Take 20 mg by mouth at bedtime. 30 tablet 2  . traMADol (ULTRAM) 50 MG tablet Take 1 tablet (50 mg total) by mouth every 8 (eight) hours as needed. 90 tablet 0  . guaiFENesin-codeine 100-10 MG/5ML syrup Take 10 mLs by mouth 3 (three) times daily as needed for cough. 120 mL 0  . nystatin cream (MYCOSTATIN) Apply 1 application topically 2 (two) times daily. (Patient not taking: Reported on 05/17/2019) 30 g 0   No current facility-administered medications for this visit.    Facility-Administered Medications Ordered in Other Visits  Medication Dose Route Frequency Provider Last Rate Last Dose  . ipratropium-albuterol (DUONEB) 0.5-2.5 (3) MG/3ML nebulizer solution 3 mL  3 mL Nebulization Q6H Burns, Wandra Feinstein, NP        PHYSICAL  EXAMINATION: ECOG PERFORMANCE STATUS: 1 - Symptomatic but completely ambulatory  BP 103/74 (BP Location: Left Arm, Patient Position: Sitting)   Pulse (!) 108   Temp (!) 97.2 F (36.2 C) (Tympanic)   Resp 18   Wt 189 lb 12.8 oz (86.1 kg)   SpO2 95%   BMI 34.71 kg/m   Filed Weights   05/31/19 0938  Weight: 189 lb 12.8 oz (86.1 kg)    Physical Exam  Constitutional: She is oriented to person, place, and time and well-developed, well-nourished, and in no distress.  She is alone.  She is walking herself.  Not on oxygen.  HENT:  Head: Normocephalic and atraumatic.  Mouth/Throat: Oropharynx is clear and moist. No oropharyngeal exudate.  Eyes: Pupils are equal, round, and reactive to light.  Neck: Normal range of motion. Neck supple.  Cardiovascular: Normal rate and regular rhythm.  Pulmonary/Chest: Effort normal and breath sounds normal. No respiratory distress. She has no wheezes.  Abdominal: Soft. Bowel sounds are normal. She exhibits no distension and no mass. There is no abdominal tenderness. There is no rebound and no guarding.  Musculoskeletal: Normal range of motion.        General: No tenderness or edema.  Neurological: She is alert and oriented to person, place, and time.  Skin: Skin is warm.  Psychiatric: Affect normal.     LABORATORY DATA:  I have reviewed the data as listed    Component Value Date/Time   NA 135 05/31/2019 0925   NA 137 12/03/2017 1447   NA 137 05/14/2014 1457   K 3.8 05/31/2019 0925   K 3.2 (L) 05/14/2014 1457   CL 101 05/31/2019 0925   CL 103 05/14/2014 1457   CO2 25 05/31/2019 0925   CO2 28 05/14/2014 1457   GLUCOSE 100 (H) 05/31/2019 0925   GLUCOSE 80 05/14/2014 1457   BUN 15 05/31/2019 0925   BUN 8  12/03/2017 1447   BUN 8 05/14/2014 1457   CREATININE 0.64 05/31/2019 0925   CREATININE 0.79 05/14/2014 1457   CALCIUM 8.4 (L) 05/31/2019 0925   CALCIUM 8.9 05/14/2014 1457   PROT 7.8 05/24/2019 0937   PROT 6.8 12/03/2017 1447   PROT 7.0  05/14/2014 1457   ALBUMIN 3.6 05/24/2019 0937   ALBUMIN 3.8 12/03/2017 1447   ALBUMIN 3.3 (L) 05/14/2014 1457   AST 23 05/24/2019 0937   AST 9 (L) 05/14/2014 1457   ALT 13 05/24/2019 0937   ALT 15 05/14/2014 1457   ALKPHOS 86 05/24/2019 0937   ALKPHOS 87 05/14/2014 1457   BILITOT 0.4 05/24/2019 0937   BILITOT 0.6 12/03/2017 1447   BILITOT 0.2 05/14/2014 1457   GFRNONAA >60 05/31/2019 0925   GFRNONAA >60 05/14/2014 1457   GFRAA >60 05/31/2019 0925   GFRAA >60 05/14/2014 1457    No results found for: SPEP, UPEP  Lab Results  Component Value Date   WBC 1.5 (L) 05/31/2019   NEUTROABS 1.0 (L) 05/31/2019   HGB 9.0 (L) 05/31/2019   HCT 28.1 (L) 05/31/2019   MCV 84.6 05/31/2019   PLT 106 (L) 05/31/2019      Chemistry      Component Value Date/Time   NA 135 05/31/2019 0925   NA 137 12/03/2017 1447   NA 137 05/14/2014 1457   K 3.8 05/31/2019 0925   K 3.2 (L) 05/14/2014 1457   CL 101 05/31/2019 0925   CL 103 05/14/2014 1457   CO2 25 05/31/2019 0925   CO2 28 05/14/2014 1457   BUN 15 05/31/2019 0925   BUN 8 12/03/2017 1447   BUN 8 05/14/2014 1457   CREATININE 0.64 05/31/2019 0925   CREATININE 0.79 05/14/2014 1457      Component Value Date/Time   CALCIUM 8.4 (L) 05/31/2019 0925   CALCIUM 8.9 05/14/2014 1457   ALKPHOS 86 05/24/2019 0937   ALKPHOS 87 05/14/2014 1457   AST 23 05/24/2019 0937   AST 9 (L) 05/14/2014 1457   ALT 13 05/24/2019 0937   ALT 15 05/14/2014 1457   BILITOT 0.4 05/24/2019 0937   BILITOT 0.6 12/03/2017 1447   BILITOT 0.2 05/14/2014 1457       RADIOGRAPHIC STUDIES: I have personally reviewed the radiological images as listed and agreed with the findings in the report. No results found.   ASSESSMENT & PLAN:  Primary cancer of right lower lobe of lung ([place]) #Recurrent squamous cell carcinoma status post biopsy of right hilar lymph node-stage III; currently on  carbotaxol weekly; along with radiation [until August 14].    #Proceed with # 6-  carbo-Taxol. Labs today reviewed; ANC- 1.0; hb-9.0/ platlets-106.  Discussed that we will plan to get a CT scan in approximately 4 to 6 weeks post treatment.  Will order CT scan at next visit.  # Cellulitis of port incision s/p levaquin- improved.   # Left lingular inflammation/ infection-question radiation- improved.  Status post Levaquin.  # dysphagia-lower esophagus benign-appearing stricture ; however biopsy positive for dysplastic squamous cells.  Hold off repeat endoscopy at this time.  #  right chest wall pain- pleuritic; likely musculoskeletal. on Percocet every 8 hours.  Prescription tramadol given.  Stable.  # COPD- stable. Continue inhalers at home.  # insominia/anxiety- on trazadone/ seroqul; klonipin--stable  # DISPOSITION:  # proceed with carbo-Taxol today # 2 week-MD cbc/cmp/ no chemo-Dr.B     No orders of the defined types were placed in this encounter.  All questions were  answered. The patient knows to call the clinic with any problems, questions or concerns.      Cammie Sickle, MD 05/31/2019 1:14 PM

## 2019-05-31 NOTE — Progress Notes (Signed)
Pt in for follow up, states finished antibiotic.  Denies any concerns today.

## 2019-06-01 ENCOUNTER — Other Ambulatory Visit: Payer: Self-pay

## 2019-06-01 ENCOUNTER — Ambulatory Visit
Admission: RE | Admit: 2019-06-01 | Discharge: 2019-06-01 | Disposition: A | Discharge status: Home / Self Care | Payer: Medicare Other | Source: Ambulatory Visit | Attending: Radiation Oncology | Admitting: Radiation Oncology

## 2019-06-01 ENCOUNTER — Other Ambulatory Visit: Payer: Self-pay | Admitting: *Deleted

## 2019-06-01 ENCOUNTER — Encounter: Payer: Self-pay | Admitting: Pain Medicine

## 2019-06-01 ENCOUNTER — Ambulatory Visit: Payer: Medicare Other

## 2019-06-01 DIAGNOSIS — Z87891 Personal history of nicotine dependence: Secondary | ICD-10-CM | POA: Diagnosis not present

## 2019-06-01 DIAGNOSIS — C771 Secondary and unspecified malignant neoplasm of intrathoracic lymph nodes: Secondary | ICD-10-CM | POA: Diagnosis not present

## 2019-06-01 DIAGNOSIS — C3431 Malignant neoplasm of lower lobe, right bronchus or lung: Secondary | ICD-10-CM | POA: Diagnosis not present

## 2019-06-01 DIAGNOSIS — Z510 Encounter for antineoplastic radiation therapy: Secondary | ICD-10-CM | POA: Diagnosis not present

## 2019-06-01 NOTE — Telephone Encounter (Signed)
Per Nira Conn, prescription was faxed yesterday. Called pharmacy and confirmed that pt picked up prescription.

## 2019-06-01 NOTE — Telephone Encounter (Signed)
Pt left message that the pharmacy did not receive prescription for cough medicine. Prescription will be re-sent electronically.

## 2019-06-02 ENCOUNTER — Other Ambulatory Visit: Payer: Self-pay

## 2019-06-02 ENCOUNTER — Ambulatory Visit
Admission: RE | Admit: 2019-06-02 | Discharge: 2019-06-02 | Disposition: A | Discharge status: Home / Self Care | Payer: Medicare Other | Source: Ambulatory Visit | Attending: Radiation Oncology | Admitting: Radiation Oncology

## 2019-06-02 DIAGNOSIS — Z87891 Personal history of nicotine dependence: Secondary | ICD-10-CM | POA: Diagnosis not present

## 2019-06-02 DIAGNOSIS — C771 Secondary and unspecified malignant neoplasm of intrathoracic lymph nodes: Secondary | ICD-10-CM | POA: Diagnosis not present

## 2019-06-02 DIAGNOSIS — Z510 Encounter for antineoplastic radiation therapy: Secondary | ICD-10-CM | POA: Diagnosis not present

## 2019-06-02 DIAGNOSIS — C3431 Malignant neoplasm of lower lobe, right bronchus or lung: Secondary | ICD-10-CM | POA: Diagnosis not present

## 2019-06-04 NOTE — Progress Notes (Signed)
Pain Management Virtual Encounter Note - Virtual Visit via Telephone Telehealth (real-time audio visits between healthcare provider and patient).   Patient's Phone No. & Preferred Pharmacy:  [phone] (home); [phone] (mobile); (Preferred) [phone] [email or web address]  [id number] [address], [place] AT [facility] OF [address] & [address] [place] [address] Phone: [phone] Fax: [phone]    Pre-screening note:  Our staff contacted Susan Houston and offered her an "in person", "face-to-face" appointment versus a telephone encounter. She indicated preferring the telephone encounter, at this time.   Reason for Virtual Visit: COVID-19*  Social distancing based on CDC and AMA recommendations.   I contacted Susan Houston on 06/05/2019 via telephone.      I clearly identified myself as Gaspar Cola, MD. I verified that I was speaking with the correct person using two identifiers (Name: Susan Houston, and date of birth: 1961-06-03).  Advanced Informed Consent I sought verbal advanced consent from Susan Houston for virtual visit interactions. I informed Susan Houston of possible security and privacy concerns, risks, and limitations associated with providing "not-in-person" medical evaluation and management services. I also informed Susan Houston of the availability of "in-person" appointments. Finally, I informed her that there would be a charge for the virtual visit and that she could be  personally, fully or partially, financially responsible for it. Susan Houston expressed understanding and agreed to proceed.   Historic Elements   Susan Houston is a 58 y.o. year old, female patient evaluated today after her last encounter by our practice on 03/07/2019. Susan Houston  has a past medical history of Anemia, Anxiety, Arthritis, Asthma, Back pain, Bruises easily, Chronic leg pain, COPD (chronic obstructive pulmonary disease) ([place]), Depression, Difficulty sleeping,  Diverticulitis, Dyspnea, Dysrhythmia (04/2018), Facet syndrome, lumbar (03/28/2015), GERD (gastroesophageal reflux disease), Hemorrhoids, History of kidney stones (04/2018), Hyperlipidemia, Incontinence of urine, Laryngeal cancer ([place]) (2009), Migraine, Migraine without aura and without status migrainosus, not intractable (09/11/2016), Mild cognitive impairment (11/23/2017), MRSA infection greater than 3 months ago, Nerve damage, Nodule of lower lobe of right lung (04/22/2018), Oxygen deficiency, Primary cancer of right lower lobe of lung ([place]) (04/18/2018), and Wears dentures. She also  has a past surgical history that includes Bladder suspension (2014); Tonsillectomy; Excision neuroma (Bilateral, 09/24/2014); Esophagogastroduodenoscopy (2015); Colonoscopy; Esophagogastroduodenoscopy (egd) with propofol (N/A, 11/20/2016); Abdominal hysterectomy (1983); Eye surgery (Left, 05/11/2018); Cholecystectomy (1998); Cataract extraction w/PHACO (Left, 05/11/2018); Thoracotomy/lobectomy (Right, 05/16/2018); Video bronchoscopy (N/A, 05/16/2018); Cataract extraction w/PHACO (Right, 08/11/2018); Total knee revision (Left, 11/29/2018); Joint replacement (2009/2010); Joint replacement (2020); Endobronchial ultrasound (N/A, 03/20/2019); Portacath placement (N/A, 04/05/2019); Colonoscopy with propofol (N/A, 04/28/2019); and Esophagogastroduodenoscopy (egd) with propofol (N/A, 04/28/2019). Susan Houston has a current medication list which includes the following prescription(s): albuterol, albuterol, aspirin ec, aspirin-acetaminophen-caffeine, clonazepam, estradiol, fluoxetine, guaifenesin-codeine, hydroxyzine, lidocaine-prilocaine, loperamide, magic mouthwash, nystatin cream, omeprazole, ondansetron, oxycodone-acetaminophen, oxygen-helium, pregabalin, prochlorperazine, sucralfate, belsomra, and tramadol, and the following Facility-Administered Medications: ipratropium-albuterol. She  reports that she quit smoking about 3 years ago. Her smoking use included  cigarettes. She smoked 0.50 packs per day. She has never used smokeless tobacco. She reports that she does not drink alcohol or use drugs. Susan Houston is allergic to morphine and related; cymbalta [duloxetine hcl]; adhesive [tape]; and lexapro [escitalopram oxalate].   HPI  Today, she is being contacted for medication management.  The patient indicates doing well with the current medication regimen. No adverse reactions or side effects reported to the medications.  Pharmacotherapy Assessment  Analgesic: Lyrica 75 mg, 1 tab PO TID. No opioid analgesics from our practice   Monitoring: Pharmacotherapy: No side-effects or adverse reactions reported. [place] PMP: PDMP reviewed during this encounter.       Compliance: No problems identified. Effectiveness: Clinically acceptable. Plan: Refer to "POC".  UDS:  Summary  Date Value Ref Range Status  08/18/2018 FINAL  Final    Comment:    ==================================================================== TOXASSURE COMP DRUG ANALYSIS,UR ==================================================================== Test                             Result       Flag       Units Drug Present and Declared for Prescription Verification   Oxycodone                      2229         EXPECTED   ng/mg creat   Oxymorphone                    557          EXPECTED   ng/mg creat   Noroxycodone                   3030         EXPECTED   ng/mg creat   Noroxymorphone                 286          EXPECTED   ng/mg creat    Sources of oxycodone are scheduled prescription medications.    Oxymorphone, noroxycodone, and noroxymorphone are expected    metabolites of oxycodone. Oxymorphone is also available as a    scheduled prescription medication.   Gabapentin                     PRESENT      EXPECTED   Tizanidine                     PRESENT      EXPECTED   Trazodone                      PRESENT      EXPECTED   1,3 chlorophenyl piperazine    PRESENT      EXPECTED     1,3-chlorophenyl piperazine is an expected metabolite of    trazodone. Drug Present not Declared for Prescription Verification   Acetaminophen                  PRESENT      UNEXPECTED   Ibuprofen                      PRESENT      UNEXPECTED   Diphenhydramine                PRESENT      UNEXPECTED Drug Absent but Declared for Prescription Verification   Salicylate                     Not Detected UNEXPECTED    Aspirin, as indicated in the declared medication list, is not    always detected even when used as directed. ==================================================================== Test  Result    Flag   Units      Ref Range   Creatinine              56               mg/dL      >=20 ==================================================================== Declared Medications:  The flagging and interpretation on this report are based on the  following declared medications.  Unexpected results may arise from  inaccuracies in the declared medications.  **Note: The testing scope of this panel includes these medications:  Gabapentin  Oxycodone  Trazodone (Desyrel)  **Note: The testing scope of this panel does not include small to  moderate amounts of these reported medications:  Aspirin (Aspirin 81)  Tizanidine (Zanaflex)  **Note: The testing scope of this panel does not include following  reported medications:  Albuterol ==================================================================== For clinical consultation, please call [phone]. ====================================================================    Laboratory Chemistry Profile (12 mo)  Renal: 05/31/2019: BUN 15; Creatinine, Ser 0.64  Lab Results  Component Value Date   GFRAA >60 05/31/2019   GFRNONAA >60 05/31/2019   Hepatic: 05/24/2019: Albumin 3.6 Lab Results  Component Value Date   AST 23 05/24/2019   ALT 13 05/24/2019   Other: 08/18/2018: 25-Hydroxy, Vitamin D 25; 25-Hydroxy, Vitamin D-2  2.1; 25-Hydroxy, Vitamin D-3 23; CRP 6; Vitamin B-12 405 11/24/2018: Sed Rate 41 Note: Above Lab results reviewed.  Imaging  Last 90 days:  Dg Chest 2 View  Result Date: 05/01/2019 CLINICAL DATA:  LEFT upper chest pain with cough and shortness of breath since yesterday. History of partial lung resection in June. Former smoker. History of laryngeal cancer and asthma. EXAM: CHEST - 2 VIEW COMPARISON:  Chest x-ray dated 04/05/2019. Chest x-ray dated 11/12/2018. FINDINGS: Heart size and mediastinal contours are within normal limits. LEFT chest wall Port-A-Cath remains adequately positioned with tip at the level of the mid SVC. Surgical sutures are again noted at the RIGHT lung base, compatible with the given history of partial lung resection. Lungs otherwise clear. No pleural effusions seen. No pneumothorax seen. Osseous structures about the chest are unremarkable. IMPRESSION: No active cardiopulmonary disease. No evidence of pneumonia or pulmonary edema. Electronically Signed   By: Franki Cabot M.D.   On: 05/01/2019 17:14   Ct Chest W Contrast  Result Date: 05/08/2019 CLINICAL DATA:  Right lung cancer.  Shortness of breath. EXAM: CT CHEST WITH CONTRAST TECHNIQUE: Multidetector CT imaging of the chest was performed during intravenous contrast administration. CONTRAST:  31mL OMNIPAQUE IOHEXOL 300 MG/ML  SOLN COMPARISON:  PET 02/28/2019 and CT chest 02/21/2019. FINDINGS: Cardiovascular: Left IJ Port-A-Cath terminates in the SVC. Atherosclerotic calcification of the aorta and coronary arteries. Partial anomalous pulmonary venous return is seen in the left upper lobe. Heart size normal although the left ventricle appears hypertrophied. No pericardial effusion. Mediastinum/Nodes: No pathologically enlarged mediastinal lymph nodes. Right hilar lymph node measures 10 mm, similar. Left hilar lymphoid tissue without discrete adenopathy. No axillary adenopathy. Esophagus is grossly unremarkable. Lungs/Pleura:  Centrilobular emphysema. Postoperative changes in the lower right hemithorax. Peribronchovascular ground-glass and nodularity in the lingula and central left lower lobe are new. Associated fluid/debris in left lower lobe subsegmental bronchi. No pleural fluid. There may be adherent debris in the upper trachea. Airway is otherwise unremarkable. Upper Abdomen: Visualized portions of the liver, adrenal glands, kidneys, spleen, pancreas, stomach and bowel are grossly unremarkable. Cholecystectomy. Periportal lymph nodes are not enlarged by CT size criteria. Musculoskeletal: No worrisome lytic or sclerotic lesions. IMPRESSION:  1. Right hilar/infrahilar lymph nodes appear similar. 2. Fluid/debris in left lower lobe bronchi with new peribronchovascular ground-glass and nodularity in the lingula and left lower lobe. Findings are most indicative of an infectious bronchiolitis/bronchopneumonia. 3. Aortic atherosclerosis (ICD10-170.0). Coronary artery calcification. 4.  Emphysema (ICD10-J43.9). Electronically Signed   By: Lorin Picket M.D.   On: 05/08/2019 10:59   Dg Chest Port 1 View  Result Date: 04/05/2019 CLINICAL DATA:  Postoperative study. EXAM: PORTABLE CHEST 1 VIEW COMPARISON:  November 12, 2018 FINDINGS: The left Port-A-Cath terminates in the central SVC. Postoperative changes in the right base. No pneumothorax. The cardiomediastinal silhouette is normal. IMPRESSION: The left Port-A-Cath terminates in the central SVC. No pneumothorax. No other acute abnormalities. Electronically Signed   By: Dorise Bullion III M.D   On: 04/05/2019 11:44   Dg C-arm 1-60 Min-no Report  Result Date: 04/05/2019 Fluoroscopy was utilized by the requesting physician.  No radiographic interpretation.   Last Hospital Admission:  No results found. Assessment  The primary encounter diagnosis was Chronic pain syndrome. Diagnoses of Chronic chest wall pain following surgery (Primary Area of Pain) (Right), Chronic post-thoracotomy  pain (Secondary area of Pain) (Right), Primary cancer of right lower lobe of lung (HCC), Post-thoracotomy pain syndrome, Fibromyalgia syndrome, Intercostal neuralgia, and Neurogenic pain were also pertinent to this visit.  Plan of Care  I have changed Susan Houston's pregabalin. I am also having her maintain her albuterol, OXYGEN, omeprazole, albuterol, estradiol, nystatin cream, aspirin-acetaminophen-caffeine, aspirin EC, hydrOXYzine, lidocaine-prilocaine, ondansetron, prochlorperazine, sucralfate, magic mouthwash, loperamide, oxyCODONE-acetaminophen, Belsomra, clonazePAM, FLUoxetine, traMADol, and guaiFENesin-codeine.  Pharmacotherapy (Medications Ordered): Meds ordered this encounter  Medications  . pregabalin (LYRICA) 75 MG capsule    Sig: Take 1 capsule (75 mg total) by mouth 3 (three) times daily.    Dispense:  270 capsule    Refill:  1    Fill one day early if pharmacy is closed on scheduled refill date. May substitute for generic if available.   Orders:  No orders of the defined types were placed in this encounter.  Follow-up plan:   Return in about 6 months (around 11/29/2019) for (VV), E/M (MM).      Interventional management options:  Considering: Possible rightT5, T6, T7intercostal RFA#1   Palliative PRN treatment(s): Palliative rightT5, T6, T7intercostal nerve block #5  Palliative right costochondral injection #3     Recent Visits Date Type Provider Dept  03/07/19 Office Visit Vevelyn Francois, NP [facility]  Showing recent visits within past 90 days and meeting all other requirements   Today's Visits Date Type Provider Dept  06/05/19 Office Visit Milinda Pointer, MD [facility]  Showing today's visits and meeting all other requirements   Future Appointments No visits were found meeting these conditions.  Showing future appointments within next 90 days and meeting all other requirements   I discussed the assessment and treatment  plan with the patient. The patient was provided an opportunity to ask questions and all were answered. The patient agreed with the plan and demonstrated an understanding of the instructions.  Patient advised to call back or seek an in-person evaluation if the symptoms or condition worsens.  Total duration of non-face-to-face encounter: 12 minutes.  Note by: Gaspar Cola, MD Date: 06/05/2019; Time: 1:37 PM  Note: This dictation was prepared with Dragon dictation. Any transcriptional errors that may result from this process are unintentional.  Disclaimer:  * Given the special circumstances of the COVID-19 pandemic, the federal government has announced that  the Office for Civil Rights (OCR) will exercise its enforcement discretion and will not impose penalties on physicians using telehealth in the event of noncompliance with regulatory requirements under the [place] and Accountability Act (HIPAA) in connection with the good faith provision of telehealth during the [id number]-[id number] national public health emergency. ([place])

## 2019-06-05 ENCOUNTER — Telehealth: Payer: Self-pay

## 2019-06-05 ENCOUNTER — Ambulatory Visit: Payer: Medicare Other | Attending: Pain Medicine | Admitting: Pain Medicine

## 2019-06-05 ENCOUNTER — Other Ambulatory Visit: Payer: Self-pay

## 2019-06-05 ENCOUNTER — Inpatient Hospital Stay: Payer: Medicare Other

## 2019-06-05 DIAGNOSIS — G8922 Chronic post-thoracotomy pain: Secondary | ICD-10-CM | POA: Diagnosis not present

## 2019-06-05 DIAGNOSIS — C3431 Malignant neoplasm of lower lobe, right bronchus or lung: Secondary | ICD-10-CM | POA: Diagnosis not present

## 2019-06-05 DIAGNOSIS — M792 Neuralgia and neuritis, unspecified: Secondary | ICD-10-CM

## 2019-06-05 DIAGNOSIS — G894 Chronic pain syndrome: Secondary | ICD-10-CM

## 2019-06-05 DIAGNOSIS — G8912 Acute post-thoracotomy pain: Secondary | ICD-10-CM | POA: Diagnosis not present

## 2019-06-05 DIAGNOSIS — M797 Fibromyalgia: Secondary | ICD-10-CM

## 2019-06-05 DIAGNOSIS — R0789 Other chest pain: Secondary | ICD-10-CM | POA: Diagnosis not present

## 2019-06-05 DIAGNOSIS — G588 Other specified mononeuropathies: Secondary | ICD-10-CM

## 2019-06-05 DIAGNOSIS — G8918 Other acute postprocedural pain: Secondary | ICD-10-CM

## 2019-06-05 MED ORDER — PREGABALIN 75 MG PO CAPS: 75.0000 mg | ORAL_CAPSULE | Freq: Three times a day (TID) | ORAL | 1 refills | Status: AC

## 2019-06-05 NOTE — Telephone Encounter (Signed)
Nutrition  Tried to call patient again this afternoon for nutrition follow-up.  No answer. Message left on answering machine by RD earlier this am.    Maely Clements B. Zenia Resides, [place], [place] Registered Dietitian [phone] (pager)

## 2019-06-05 NOTE — Progress Notes (Signed)
Nutrition:  Called patient for nutrition follow-up.  No answer. Left message for patient to return call.   Delorice Bannister B. Zenia Resides, [place], [place] Registered Dietitian [phone] (pager)

## 2019-06-08 ENCOUNTER — Other Ambulatory Visit: Payer: Self-pay | Admitting: *Deleted

## 2019-06-08 NOTE — Telephone Encounter (Signed)
Pt calling in to request refill of cough syrup.

## 2019-06-09 ENCOUNTER — Other Ambulatory Visit: Payer: Self-pay | Admitting: Psychiatry

## 2019-06-09 DIAGNOSIS — F330 Major depressive disorder, recurrent, mild: Secondary | ICD-10-CM

## 2019-06-09 MED ORDER — GUAIFENESIN-CODEINE 100-10 MG/5ML PO SOLN: 5.0000 mL | Freq: Two times a day (BID) | ORAL | 0 refills | Status: DC | PRN

## 2019-06-09 NOTE — Telephone Encounter (Signed)
Refilled

## 2019-06-13 ENCOUNTER — Other Ambulatory Visit: Payer: Self-pay

## 2019-06-14 ENCOUNTER — Inpatient Hospital Stay: Payer: Medicare Other

## 2019-06-14 ENCOUNTER — Other Ambulatory Visit: Payer: Self-pay

## 2019-06-14 ENCOUNTER — Inpatient Hospital Stay (HOSPITAL_BASED_OUTPATIENT_CLINIC_OR_DEPARTMENT_OTHER): Payer: Medicare Other | Admitting: Internal Medicine

## 2019-06-14 ENCOUNTER — Encounter: Payer: Self-pay | Admitting: Internal Medicine

## 2019-06-14 ENCOUNTER — Ambulatory Visit
Admission: RE | Admit: 2019-06-14 | Discharge: 2019-06-14 | Disposition: A | Discharge status: Home / Self Care | Payer: Medicare Other | Source: Ambulatory Visit | Attending: Internal Medicine | Admitting: Internal Medicine

## 2019-06-14 DIAGNOSIS — Z79899 Other long term (current) drug therapy: Secondary | ICD-10-CM | POA: Diagnosis not present

## 2019-06-14 DIAGNOSIS — R0789 Other chest pain: Secondary | ICD-10-CM | POA: Diagnosis not present

## 2019-06-14 DIAGNOSIS — J449 Chronic obstructive pulmonary disease, unspecified: Secondary | ICD-10-CM | POA: Diagnosis not present

## 2019-06-14 DIAGNOSIS — Z833 Family history of diabetes mellitus: Secondary | ICD-10-CM | POA: Diagnosis not present

## 2019-06-14 DIAGNOSIS — Z885 Allergy status to narcotic agent status: Secondary | ICD-10-CM | POA: Diagnosis not present

## 2019-06-14 DIAGNOSIS — C3431 Malignant neoplasm of lower lobe, right bronchus or lung: Secondary | ICD-10-CM

## 2019-06-14 DIAGNOSIS — R5383 Other fatigue: Secondary | ICD-10-CM | POA: Diagnosis not present

## 2019-06-14 DIAGNOSIS — Z888 Allergy status to other drugs, medicaments and biological substances status: Secondary | ICD-10-CM | POA: Diagnosis not present

## 2019-06-14 DIAGNOSIS — Z823 Family history of stroke: Secondary | ICD-10-CM | POA: Diagnosis not present

## 2019-06-14 DIAGNOSIS — Z87891 Personal history of nicotine dependence: Secondary | ICD-10-CM | POA: Diagnosis not present

## 2019-06-14 DIAGNOSIS — Z5111 Encounter for antineoplastic chemotherapy: Secondary | ICD-10-CM | POA: Diagnosis not present

## 2019-06-14 DIAGNOSIS — R1310 Dysphagia, unspecified: Secondary | ICD-10-CM | POA: Diagnosis not present

## 2019-06-14 DIAGNOSIS — Z83438 Family history of other disorder of lipoprotein metabolism and other lipidemia: Secondary | ICD-10-CM | POA: Diagnosis not present

## 2019-06-14 DIAGNOSIS — Z841 Family history of disorders of kidney and ureter: Secondary | ICD-10-CM | POA: Diagnosis not present

## 2019-06-14 DIAGNOSIS — Z8379 Family history of other diseases of the digestive system: Secondary | ICD-10-CM | POA: Diagnosis not present

## 2019-06-14 DIAGNOSIS — Z8261 Family history of arthritis: Secondary | ICD-10-CM | POA: Diagnosis not present

## 2019-06-14 DIAGNOSIS — C329 Malignant neoplasm of larynx, unspecified: Secondary | ICD-10-CM | POA: Diagnosis not present

## 2019-06-14 DIAGNOSIS — R079 Chest pain, unspecified: Secondary | ICD-10-CM | POA: Diagnosis not present

## 2019-06-14 DIAGNOSIS — R05 Cough: Secondary | ICD-10-CM | POA: Diagnosis not present

## 2019-06-14 DIAGNOSIS — R21 Rash and other nonspecific skin eruption: Secondary | ICD-10-CM | POA: Diagnosis not present

## 2019-06-14 DIAGNOSIS — Z825 Family history of asthma and other chronic lower respiratory diseases: Secondary | ICD-10-CM | POA: Diagnosis not present

## 2019-06-14 DIAGNOSIS — R0602 Shortness of breath: Secondary | ICD-10-CM | POA: Diagnosis not present

## 2019-06-14 DIAGNOSIS — Z8249 Family history of ischemic heart disease and other diseases of the circulatory system: Secondary | ICD-10-CM | POA: Diagnosis not present

## 2019-06-14 LAB — CBC WITH DIFFERENTIAL/PLATELET
Abs Immature Granulocytes: 0.01 10*3/uL (ref 0.00–0.07)
Basophils Absolute: 0 10*3/uL (ref 0.0–0.1)
Basophils Relative: 1 %
Eosinophils Absolute: 0 10*3/uL (ref 0.0–0.5)
Eosinophils Relative: 1 %
HCT: 25 % — ABNORMAL LOW (ref 36.0–46.0)
Hemoglobin: 8.1 g/dL — ABNORMAL LOW (ref 12.0–15.0)
Immature Granulocytes: 1 %
Lymphocytes Relative: 14 %
Lymphs Abs: 0.2 10*3/uL — ABNORMAL LOW (ref 0.7–4.0)
MCH: 28.1 pg (ref 26.0–34.0)
MCHC: 32.4 g/dL (ref 30.0–36.0)
MCV: 86.8 fL (ref 80.0–100.0)
Monocytes Absolute: 0.3 10*3/uL (ref 0.1–1.0)
Monocytes Relative: 20 %
Neutro Abs: 1.1 10*3/uL — ABNORMAL LOW (ref 1.7–7.7)
Neutrophils Relative %: 63 %
Platelets: 69 10*3/uL — ABNORMAL LOW (ref 150–400)
RBC: 2.88 MIL/uL — ABNORMAL LOW (ref 3.87–5.11)
RDW: 19.3 % — ABNORMAL HIGH (ref 11.5–15.5)
WBC: 1.7 10*3/uL — ABNORMAL LOW (ref 4.0–10.5)
nRBC: 0 % (ref 0.0–0.2)

## 2019-06-14 MED ORDER — HYDROCOD POLST-CPM POLST ER 10-8 MG/5ML PO SUER: 5.0000 mL | Freq: Two times a day (BID) | ORAL | 0 refills | Status: AC | PRN

## 2019-06-14 MED ORDER — PREDNISONE 20 MG PO TABS: ORAL_TABLET | ORAL | 0 refills | Status: AC

## 2019-06-14 NOTE — Progress Notes (Signed)
+[facility] OFFICE PROGRESS NOTE  Patient Care Team: Valerie Roys, DO as PCP - General (Family Medicine) Anabel Bene, MD as Referring Physician (Neurology) Telford Nab, RN as Registered Nurse  Cancer Staging No matching staging information was found for the patient.   Oncology History Overview Note  # 2011-laryngeal cancer/SCC; Stage II; April-June 2011 s/p  CRT [Dr.Choski/Bennett]  # 2019- likely STAGE I 10 mm RLL nodule [incidental]; s/p wedge resection-July 2019-stage I squamous cell lung cancer. NO  Adjuvant therapy.  # May 28th 2020-stage III lung cancer-recurrent  RIGHT HILAR-- POSITIVE FOR MALIGNANT NEOPLASTIC CELLS. - CONSISTENT WITH METASTATIC SQUAMOUS CELL CARCINOMA.  #June 2020- Carbo-taxol- with RT  # COPD  # post thoracotomy syndrome  # EGD [Dr.Anna]- July,2020 -" dysplastic squamous cells" awaiting repeating EGD end of August-early September  DIAGNOSIS: RLL lung ca  STAGE:  III ; GOALS: cure  CURRENT/MOST RECENT THERAPY: Carbo-taxol- RT    Laryngeal cancer ([place])  Primary cancer of right lower lobe of lung ([place])  05/27/2018 Initial Diagnosis   Primary cancer of right lower lobe of lung ([place])   04/17/2019 -  Chemotherapy   The patient had palonosetron (ALOXI) injection 0.25 mg, 0.25 mg, Intravenous,  Once, 6 of 8 cycles Administration: 0.25 mg (04/17/2019), 0.25 mg (05/24/2019), 0.25 mg (05/31/2019), 0.25 mg (04/24/2019), 0.25 mg (05/02/2019), 0.25 mg (05/10/2019) CARBOplatin (PARAPLATIN) 270 mg in sodium chloride 0.9 % 250 mL chemo infusion, 270 mg (100 % of original dose 265.8 mg), Intravenous,  Once, 6 of 8 cycles Dose modification:   (original dose 265.8 mg, Cycle 1) Administration: 270 mg (04/17/2019), 220 mg (05/24/2019), 270 mg (05/31/2019), 270 mg (04/24/2019), 270 mg (05/02/2019), 270 mg (05/10/2019) PACLitaxel (TAXOL) 90 mg in sodium chloride 0.9 % 250 mL chemo infusion (</= 80mg /m2), 45 mg/m2 = 90 mg, Intravenous,  Once, 6 of 8  cycles Administration: 90 mg (04/17/2019), 90 mg (05/24/2019), 90 mg (05/31/2019), 90 mg (04/24/2019), 90 mg (05/02/2019), 90 mg (05/10/2019)  for chemotherapy treatment.      INTERVAL HISTORY:  Susan Houston 58 y.o.  female recurrent stage III squamous lung cancer -currently status post carbotaxol with radiation is here for follow-up.  She finished carbotaxol approximately 2 weeks ago.  Patient complains of worsening shortness of breath and cough for the last week or so.  No fevers or chills.  No hemoptysis.  No swelling in the legs. No headaches.  No diarrhea.  Poor appetite.  Review of Systems  Constitutional: Positive for malaise/fatigue. Negative for chills, diaphoresis, fever and weight loss.  HENT: Negative for nosebleeds and sore throat.   Eyes: Negative for double vision.  Respiratory: Negative for hemoptysis, sputum production and wheezing.   Cardiovascular: Positive for chest pain. Negative for palpitations, orthopnea and leg swelling.  Gastrointestinal: Negative for abdominal pain, blood in stool, heartburn, melena, nausea and vomiting.  Genitourinary: Negative for dysuria, frequency and urgency.  Musculoskeletal: Negative for back pain and joint pain.  Skin: Negative for itching.  Neurological: Negative for dizziness, tingling, focal weakness, weakness and headaches.  Endo/Heme/Allergies: Does not bruise/bleed easily.  Psychiatric/Behavioral: Negative for depression. The patient is nervous/anxious and has insomnia.     PAST MEDICAL HISTORY :  Past Medical History:  Diagnosis Date  . Anemia    vitamin b12 deficiency. no longer taking supplements  . Anxiety   . Arthritis   . Asthma   . Back pain   . Bruises easily   . Chronic leg pain    BILATERAL  .  COPD (chronic obstructive pulmonary disease) (HCC)    no inhalers for over 1 year  . Depression   . Difficulty sleeping   . Diverticulitis   . Dyspnea    due to having part of lung removed in 2019  . Dysrhythmia 04/2018    brady episodes. cleared by dr. Clayborn Bigness  . Facet syndrome, lumbar 03/28/2015  . GERD (gastroesophageal reflux disease)   . Hemorrhoids   . History of kidney stones 04/2018   recently passed stone  . Hyperlipidemia   . Incontinence of urine   . Laryngeal cancer ([place]) 2009   LARYNX CANCER - CHEMO / RADIATION (NO SURGERY)   . Migraine   . Migraine without aura and without status migrainosus, not intractable 09/11/2016  . Mild cognitive impairment 11/23/2017  . MRSA infection greater than 3 months ago    2008 right side of face  . Nerve damage    right leg. thinks this is from her TKR  . Nodule of lower lobe of right lung 04/22/2018  . Oxygen deficiency    2L/HS  . Primary cancer of right lower lobe of lung ([place]) 04/18/2018   Surgical resection of RLL  . Wears dentures    full upper and lower    PAST SURGICAL HISTORY :   Past Surgical History:  Procedure Laterality Date  . ABDOMINAL HYSTERECTOMY  1983  . BLADDER SUSPENSION  2014  . CATARACT EXTRACTION W/PHACO Left 05/11/2018   Procedure: CATARACT EXTRACTION PHACO AND INTRAOCULAR LENS PLACEMENT ([place]) LEFT;  Surgeon: Leandrew Koyanagi, MD;  Location: [facility];  Service: Ophthalmology;  Laterality: Left;  PREFERS EARLY  . CATARACT EXTRACTION W/PHACO Right 08/11/2018   Procedure: CATARACT EXTRACTION PHACO AND INTRAOCULAR LENS PLACEMENT ([facility]);  Surgeon: Leandrew Koyanagi, MD;  Location: [facility];  Service: Ophthalmology;  Laterality: Right;  CDE 5:02 Total Time 01:35 Fluid lot # [id number]  . CHOLECYSTECTOMY  1998  . COLONOSCOPY    . COLONOSCOPY WITH PROPOFOL N/A 04/28/2019   Procedure: COLONOSCOPY WITH PROPOFOL;  Surgeon: Jonathon Bellows, MD;  Location: [facility]COPY;  Service: Gastroenterology;  Laterality: N/A;  . ENDOBRONCHIAL ULTRASOUND N/A 03/20/2019   Procedure: ENDOBRONCHIAL ULTRASOUND;  Surgeon: Flora Lipps, MD;  Location: [facility];  Service: Cardiopulmonary;  Laterality: N/A;  . ESOPHAGOGASTRODUODENOSCOPY  2015  .  ESOPHAGOGASTRODUODENOSCOPY (EGD) WITH PROPOFOL N/A 11/20/2016   Procedure: ESOPHAGOGASTRODUODENOSCOPY (EGD) WITH PROPOFOL;  Surgeon: Jonathon Bellows, MD;  Location: [facility] ENDOSCOPY;  Service: Endoscopy;  Laterality: N/A;  . ESOPHAGOGASTRODUODENOSCOPY (EGD) WITH PROPOFOL N/A 04/28/2019   Procedure: ESOPHAGOGASTRODUODENOSCOPY (EGD) WITH PROPOFOL;  Surgeon: Jonathon Bellows, MD;  Location: [facility] ENDOSCOPY;  Service: Gastroenterology;  Laterality: N/A;  Pt needs appointment after 9:30am  . EXCISION NEUROMA Bilateral 09/24/2014   Procedure: BILATERAL OPEN SAPHENOUS NEURECTOMIES AND OPEN LEFT PERIPATELLA OSETOPHYTECTOMY;  Surgeon: Mauri Pole, MD;  Location: [facility];  Service: Orthopedics;  Laterality: Bilateral;  . EYE SURGERY Left 05/11/2018   cataract extraction  . JOINT REPLACEMENT  2009/2010   BIL TOTAL KNEES  . JOINT REPLACEMENT  2020   left  . PORTACATH PLACEMENT N/A 04/05/2019   Procedure: INSERTION PORT-A-CATH;  Surgeon: Nestor Lewandowsky, MD;  Location: [facility];  Service: General;  Laterality: N/A;  . THORACOTOMY/LOBECTOMY Right 05/16/2018   Procedure: THORACOTOMY/POSSIBLE LOBECTOMY;  Surgeon: Nestor Lewandowsky, MD;  Location: [facility];  Service: Thoracic;  Laterality: Right;  . TONSILLECTOMY    . TOTAL KNEE REVISION Left 11/29/2018   Procedure: Left TOTAL KNEE REVISION;  Surgeon: Hessie Knows, MD;  Location[facility]  ORS;  Service: Orthopedics;  Laterality: Left;  Marland Kitchen VIDEO BRONCHOSCOPY N/A 05/16/2018   Procedure: PREOP  BRONCHOSCOPY;  Surgeon: Nestor Lewandowsky, MD;  Location: [facility] OR[facility];  Service: Thoracic;  Laterality: N/A;    FAMILY HISTORY :   Family History  Problem Relation Age of Onset  . Asthma Mother   . Diabetes Mother   . Hyperlipidemia Mother   . Hypertension Mother   . Cirrhosis Mother        Non-alcoholic  . Arthritis Father   . Cancer Father        Bone  . Hyperlipidemia Father   . Hypertension Father   . Anxiety disorder Sister   . Depression Sister   . Kidney Stones Son   . Stroke Maternal  Grandmother   . Breast cancer Maternal Grandmother 69  . Cancer Maternal Grandfather        lung  . Pneumonia Paternal Grandmother   . Alzheimer's disease Paternal Grandfather   . Diabetes Sister   . Gout Sister   . Hypertension Sister   . Alcohol abuse Brother   . Heart disease Brother        massive MI    SOCIAL HISTORY:   Social History   Tobacco Use  . Smoking status: Former Smoker    Packs/day: 0.50    Types: Cigarettes    Quit date: 04/07/2016    Years since quitting: 3.1  . Smokeless tobacco: Never Used  . Tobacco comment: patient has not smoked x 10 days.06/28/17 chantix  Substance Use Topics  . Alcohol use: No    Alcohol/week: 0.0 standard drinks  . Drug use: No    ALLERGIES:  is allergic to morphine and related; cymbalta [duloxetine hcl]; adhesive [tape]; and lexapro [escitalopram oxalate].  MEDICATIONS:  Current Outpatient Medications  Medication Sig Dispense Refill  . albuterol (PROVENTIL HFA;VENTOLIN HFA) 108 (90 Base) MCG/ACT inhaler Inhale 2 puffs into the lungs every 4 (four) hours as needed for wheezing or shortness of breath. 1 Inhaler 2  . albuterol (PROVENTIL) (2.5 MG/3ML) 0.083% nebulizer solution Take 3 mLs (2.5 mg total) by nebulization every 4 (four) hours as needed for wheezing or shortness of breath. 75 mL 12  . aspirin EC 81 MG tablet Take 81 mg by mouth daily.    Marland Kitchen aspirin-acetaminophen-caffeine (EXCEDRIN MIGRAINE) 250-250-65 MG tablet Take 1 tablet by mouth daily as needed for headache.    . clonazePAM (KLONOPIN) 0.5 MG tablet TAKE 1/2 TO 1 TABLET BY MOUTH DAILY AS NEEDED FOR ANXIETY. TRY TO LIMIT USE AND USE ONLY FOR SEVERE ANXIETY AND SLEEP PROBLEMS 25 tablet 1  . estradiol (ESTRACE) 1 MG tablet Take 1 mg by mouth daily.     Marland Kitchen FLUoxetine (PROZAC) 40 MG capsule Take 2 capsules (80 mg total) by mouth daily. 180 capsule 1  . hydrOXYzine (VISTARIL) 25 MG capsule TAKE 1 CAPSULE(25 MG) BY MOUTH TWICE DAILY AS NEEDED FOR SEVERE ANXIETY OR SYMPTOMS 60  capsule 1  . lidocaine-prilocaine (EMLA) cream Apply to affected area once 30 g 3  . loperamide (IMODIUM) 2 MG capsule Take 2 mg by mouth as needed for diarrhea or loose stools.    . magic mouthwash SOLN Take 5 mLs by mouth 3 (three) times daily as needed for mouth pain. 240 mL 0  . nystatin cream (MYCOSTATIN) Apply 1 application topically 2 (two) times daily. 30 g 0  . omeprazole (PRILOSEC) 40 MG capsule Take 1 capsule (40 mg total) by mouth daily. (Patient taking differently: Take  40 mg by mouth every morning. ) 90 capsule 1  . ondansetron (ZOFRAN) 8 MG tablet Take 1 tablet (8 mg total) by mouth 2 (two) times daily as needed for refractory nausea / vomiting. Start on day 3 after chemo. 30 tablet 1  . oxyCODONE-acetaminophen (PERCOCET) 5-325 MG tablet Take 1 tablet by mouth every 8 (eight) hours as needed for severe pain. 45 tablet 0  . OXYGEN Inhale 2 L into the lungs at bedtime.    . pregabalin (LYRICA) 75 MG capsule Take 1 capsule (75 mg total) by mouth 3 (three) times daily. 270 capsule 1  . prochlorperazine (COMPAZINE) 10 MG tablet Take 1 tablet (10 mg total) by mouth every 6 (six) hours as needed (Nausea or vomiting). 30 tablet 1  . sucralfate (CARAFATE) 1 g tablet Take 1 tablet (1 g total) by mouth 3 (three) times daily. Dissolve in 3-4 tbsp warm water, swish and swallow. 90 tablet 3  . Suvorexant (BELSOMRA) 20 MG TABS Take 20 mg by mouth at bedtime. 30 tablet 2  . traMADol (ULTRAM) 50 MG tablet Take 1 tablet (50 mg total) by mouth every 8 (eight) hours as needed. 90 tablet 0  . chlorpheniramine-HYDROcodone (TUSSIONEX) 10-8 MG/5ML SUER Take 5 mLs by mouth every 12 (twelve) hours as needed for cough. 140 mL 0  . predniSONE (DELTASONE) 20 MG tablet Take 3 pills once a day with food x 1 week; and then 1 pill a day. 30 tablet 0   No current facility-administered medications for this visit.    Facility-Administered Medications Ordered in Other Visits  Medication Dose Route Frequency Provider  Last Rate Last Dose  . ipratropium-albuterol (DUONEB) 0.5-2.5 (3) MG/3ML nebulizer solution 3 mL  3 mL Nebulization Q6H Burns, Jennifer E, NP        PHYSICAL EXAMINATION: ECOG PERFORMANCE STATUS: 1 - Symptomatic but completely ambulatory  BP 115/80 (BP Location: Right Arm, Patient Position: Sitting, Cuff Size: Normal)   Pulse (!) 105   Temp (!) 96.5 F (35.8 C) (Tympanic)   Resp 16   Wt 186 lb (84.4 kg)   BMI 34.02 kg/m   Filed Weights   06/14/19 0937  Weight: 186 lb (84.4 kg)    Physical Exam  Constitutional: She is oriented to person, place, and time and well-developed, well-nourished, and in no distress.  She is alone.  She is walking herself.  Not on oxygen.  HENT:  Head: Normocephalic and atraumatic.  Mouth/Throat: Oropharynx is clear and moist. No oropharyngeal exudate.  Eyes: Pupils are equal, round, and reactive to light.  Neck: Normal range of motion. Neck supple.  Cardiovascular: Normal rate and regular rhythm.  Pulmonary/Chest: No respiratory distress. She has no wheezes.  Decreased breath sounds bilaterally.  Scattered wheeze.  Abdominal: Soft. Bowel sounds are normal. She exhibits no distension and no mass. There is no abdominal tenderness. There is no rebound and no guarding.  Musculoskeletal: Normal range of motion.        General: No tenderness or edema.  Neurological: She is alert and oriented to person, place, and time.  Skin: Skin is warm.  Psychiatric: Affect normal.     LABORATORY DATA:  I have reviewed the data as listed    Component Value Date/Time   NA 135 05/31/2019 0925   NA 137 12/03/2017 1447   NA 137 05/14/2014 1457   K 3.8 05/31/2019 0925   K 3.2 (L) 05/14/2014 1457   CL 101 05/31/2019 0925   CL 103 05/14/2014 1457  CO2 25 05/31/2019 0925   CO2 28 05/14/2014 1457   GLUCOSE 100 (H) 05/31/2019 0925   GLUCOSE 80 05/14/2014 1457   BUN 15 05/31/2019 0925   BUN 8 12/03/2017 1447   BUN 8 05/14/2014 1457   CREATININE 0.64 05/31/2019  0925   CREATININE 0.79 05/14/2014 1457   CALCIUM 8.4 (L) 05/31/2019 0925   CALCIUM 8.9 05/14/2014 1457   PROT 7.8 05/24/2019 0937   PROT 6.8 12/03/2017 1447   PROT 7.0 05/14/2014 1457   ALBUMIN 3.6 05/24/2019 0937   ALBUMIN 3.8 12/03/2017 1447   ALBUMIN 3.3 (L) 05/14/2014 1457   AST 23 05/24/2019 0937   AST 9 (L) 05/14/2014 1457   ALT 13 05/24/2019 0937   ALT 15 05/14/2014 1457   ALKPHOS 86 05/24/2019 0937   ALKPHOS 87 05/14/2014 1457   BILITOT 0.4 05/24/2019 0937   BILITOT 0.6 12/03/2017 1447   BILITOT 0.2 05/14/2014 1457   GFRNONAA >60 05/31/2019 0925   GFRNONAA >60 05/14/2014 1457   GFRAA >60 05/31/2019 0925   GFRAA >60 05/14/2014 1457    No results found for: SPEP, UPEP  Lab Results  Component Value Date   WBC 1.7 (L) 06/14/2019   NEUTROABS 1.1 (L) 06/14/2019   HGB 8.1 (L) 06/14/2019   HCT 25.0 (L) 06/14/2019   MCV 86.8 06/14/2019   PLT 69 (L) 06/14/2019      Chemistry      Component Value Date/Time   NA 135 05/31/2019 0925   NA 137 12/03/2017 1447   NA 137 05/14/2014 1457   K 3.8 05/31/2019 0925   K 3.2 (L) 05/14/2014 1457   CL 101 05/31/2019 0925   CL 103 05/14/2014 1457   CO2 25 05/31/2019 0925   CO2 28 05/14/2014 1457   BUN 15 05/31/2019 0925   BUN 8 12/03/2017 1447   BUN 8 05/14/2014 1457   CREATININE 0.64 05/31/2019 0925   CREATININE 0.79 05/14/2014 1457      Component Value Date/Time   CALCIUM 8.4 (L) 05/31/2019 0925   CALCIUM 8.9 05/14/2014 1457   ALKPHOS 86 05/24/2019 0937   ALKPHOS 87 05/14/2014 1457   AST 23 05/24/2019 0937   AST 9 (L) 05/14/2014 1457   ALT 13 05/24/2019 0937   ALT 15 05/14/2014 1457   BILITOT 0.4 05/24/2019 0937   BILITOT 0.6 12/03/2017 1447   BILITOT 0.2 05/14/2014 1457       RADIOGRAPHIC STUDIES: I have personally reviewed the radiological images as listed and agreed with the findings in the report. No results found.   ASSESSMENT & PLAN:  Primary cancer of right lower lobe of lung ([place]) #Recurrent squamous  cell carcinoma status post biopsy of right hilar lymph node-stage III; currently on  carbotaxol weekly; along with radiation [until August 14].  S/p  # 6- carbo-Taxol- 2 weeks ago.  #  Labs today reviewed; ANC- 1.1; hb-8.1/ platlets-69; from the recent chemotherapy will monitor closely.  Will order CT chest today to be in 2 weeks.  And then start immunotherapy every 2 weeks.  # worsening dyspnea/ cough; no fevers- COPD vs. Radiation induced; Check CXR.  pul ox - 93 -94 on RA. Ordered prednsione.  Recommend Tussionex new prescription called in.  # dysphagia-lower esophagus benign-appearing stricture ; however biopsy positive for dysplastic squamous cells. Await recovery of counts prior to repeating endoscopy.  #  right chest wall pain- pleuritic; likely musculoskeletal. on Percocet every 8 hours.   # insominia/anxiety- on trazadone/ seroqul; klonipin--stable  # DISPOSITION:  #  CXR today # 2 week-MD cbc/cmp; CT chest prior-Dr.B     Orders Placed This Encounter  Procedures  . DG Chest 2 View    Standing Status:   Future    Standing Expiration Date:   08/13/2020    Order Specific Question:   Reason for Exam (SYMPTOM  OR DIAGNOSIS REQUIRED)    Answer:   dyspnea/ lung cancer    Order Specific Question:   Is the patient pregnant?    Answer:   No    Order Specific Question:   Preferred imaging location?    Answer:   [facility]  . CT CHEST W CONTRAST    Standing Status:   Future    Standing Expiration Date:   06/13/2020    Order Specific Question:   If indicated for the ordered procedure, I authorize the administration of contrast media per Radiology protocol    Answer:   Yes    Order Specific Question:   Preferred imaging location?    Answer:   [facility]    Order Specific Question:   Radiology Contrast Protocol - do NOT remove file path    Answer:   [email or web address]    Order Specific Question:   ** REASON FOR EXAM (FREE TEXT)    Answer:   lung  cancer    Order Specific Question:   Is patient pregnant?    Answer:   No   All questions were answered. The patient knows to call the clinic with any problems, questions or concerns.      Cammie Sickle, MD 06/14/2019 10:12 AM

## 2019-06-14 NOTE — Assessment & Plan Note (Addendum)
#  Recurrent squamous cell carcinoma status post biopsy of right hilar lymph node-stage III; currently on  carbotaxol weekly; along with radiation [until August 14].  S/p  # 6- carbo-Taxol- 2 weeks ago.  #  Labs today reviewed; ANC- 1.1; hb-8.1/ platlets-69; from the recent chemotherapy will monitor closely.  Will order CT chest today to be in 2 weeks.  And then start immunotherapy every 2 weeks.  # worsening dyspnea/ cough; no fevers- COPD vs. Radiation induced; Check CXR.  pul ox - 93 -94 on RA. Ordered prednsione.  Recommend Tussionex new prescription called in.  # dysphagia-lower esophagus benign-appearing stricture ; however biopsy positive for dysplastic squamous cells. Await recovery of counts prior to repeating endoscopy.  #  right chest wall pain- pleuritic; likely musculoskeletal. on Percocet every 8 hours.   # insominia/anxiety- on trazadone/ seroqul; klonipin--stable  # DISPOSITION:  # CXR today # 2 week-MD cbc/cmp; CT chest prior-Dr.B

## 2019-06-15 ENCOUNTER — Telehealth: Payer: Self-pay | Admitting: Internal Medicine

## 2019-06-15 NOTE — Telephone Encounter (Signed)
Spoke to pt re: results of CXR- s/o Inflammation from RT rather than infection. Recommend continue steroids as recommended. If not improved in the next week; pt agrees to call her pulmonologist [Dr.Kasa?] for further directions.

## 2019-06-19 ENCOUNTER — Telehealth: Payer: Self-pay | Admitting: Gastroenterology

## 2019-06-19 NOTE — Telephone Encounter (Signed)
Patient has been advised to have her COVID test on 06/20/19 tomorrow at [facility] between 10:30 am and 12:30pm.  Thanks Sharyn Lull

## 2019-06-19 NOTE — Telephone Encounter (Signed)
Pt left vm she needs to know when to take the Covid testing for her procedure please call pt

## 2019-06-20 ENCOUNTER — Other Ambulatory Visit
Admission: RE | Admit: 2019-06-20 | Discharge: 2019-06-20 | Disposition: A | Discharge status: Home / Self Care | Payer: Medicare Other | Source: Ambulatory Visit | Attending: Gastroenterology | Admitting: Gastroenterology

## 2019-06-20 ENCOUNTER — Other Ambulatory Visit: Payer: Self-pay

## 2019-06-20 DIAGNOSIS — Z01812 Encounter for preprocedural laboratory examination: Secondary | ICD-10-CM | POA: Diagnosis not present

## 2019-06-20 DIAGNOSIS — Z20828 Contact with and (suspected) exposure to other viral communicable diseases: Secondary | ICD-10-CM | POA: Diagnosis not present

## 2019-06-20 LAB — SARS CORONAVIRUS 2 (TAT 6-24 HRS): SARS Coronavirus 2: POSITIVE — AB

## 2019-06-21 ENCOUNTER — Encounter: Payer: Self-pay | Admitting: Nurse Practitioner

## 2019-06-21 ENCOUNTER — Telehealth: Payer: Self-pay | Admitting: Family Medicine

## 2019-06-21 ENCOUNTER — Emergency Department: Payer: Medicare Other

## 2019-06-21 ENCOUNTER — Ambulatory Visit (INDEPENDENT_AMBULATORY_CARE_PROVIDER_SITE_OTHER): Payer: Medicare Other | Admitting: Nurse Practitioner

## 2019-06-21 ENCOUNTER — Telehealth: Payer: Self-pay

## 2019-06-21 ENCOUNTER — Other Ambulatory Visit: Payer: Self-pay

## 2019-06-21 ENCOUNTER — Emergency Department
Admission: EM | Admit: 2019-06-21 | Discharge: 2019-06-22 | Disposition: A | Discharge status: Other Acute Inpatient Hospital | Payer: Medicare Other | Attending: Emergency Medicine | Admitting: Emergency Medicine

## 2019-06-21 ENCOUNTER — Telehealth: Payer: Self-pay | Admitting: Internal Medicine

## 2019-06-21 DIAGNOSIS — R0689 Other abnormalities of breathing: Secondary | ICD-10-CM | POA: Diagnosis not present

## 2019-06-21 DIAGNOSIS — R069 Unspecified abnormalities of breathing: Secondary | ICD-10-CM | POA: Diagnosis not present

## 2019-06-21 DIAGNOSIS — R0602 Shortness of breath: Secondary | ICD-10-CM | POA: Diagnosis not present

## 2019-06-21 DIAGNOSIS — Z7982 Long term (current) use of aspirin: Secondary | ICD-10-CM | POA: Insufficient documentation

## 2019-06-21 DIAGNOSIS — U071 COVID-19: Secondary | ICD-10-CM | POA: Insufficient documentation

## 2019-06-21 DIAGNOSIS — J449 Chronic obstructive pulmonary disease, unspecified: Secondary | ICD-10-CM | POA: Diagnosis not present

## 2019-06-21 DIAGNOSIS — R05 Cough: Secondary | ICD-10-CM | POA: Diagnosis not present

## 2019-06-21 DIAGNOSIS — Z87891 Personal history of nicotine dependence: Secondary | ICD-10-CM | POA: Insufficient documentation

## 2019-06-21 DIAGNOSIS — D638 Anemia in other chronic diseases classified elsewhere: Secondary | ICD-10-CM

## 2019-06-21 DIAGNOSIS — J069 Acute upper respiratory infection, unspecified: Secondary | ICD-10-CM

## 2019-06-21 DIAGNOSIS — J45909 Unspecified asthma, uncomplicated: Secondary | ICD-10-CM | POA: Diagnosis not present

## 2019-06-21 DIAGNOSIS — J9601 Acute respiratory failure with hypoxia: Secondary | ICD-10-CM

## 2019-06-21 DIAGNOSIS — R0902 Hypoxemia: Secondary | ICD-10-CM | POA: Diagnosis not present

## 2019-06-21 DIAGNOSIS — Z96653 Presence of artificial knee joint, bilateral: Secondary | ICD-10-CM | POA: Insufficient documentation

## 2019-06-21 DIAGNOSIS — R000 Tachycardia, unspecified: Secondary | ICD-10-CM | POA: Diagnosis not present

## 2019-06-21 DIAGNOSIS — Z79899 Other long term (current) drug therapy: Secondary | ICD-10-CM | POA: Insufficient documentation

## 2019-06-21 LAB — CBC WITH DIFFERENTIAL/PLATELET
Abs Immature Granulocytes: 0.01 10*3/uL (ref 0.00–0.07)
Basophils Absolute: 0 10*3/uL (ref 0.0–0.1)
Basophils Relative: 0 %
Eosinophils Absolute: 0 10*3/uL (ref 0.0–0.5)
Eosinophils Relative: 0 %
HCT: 21.8 % — ABNORMAL LOW (ref 36.0–46.0)
Hemoglobin: 7.6 g/dL — ABNORMAL LOW (ref 12.0–15.0)
Immature Granulocytes: 1 %
Lymphocytes Relative: 6 %
Lymphs Abs: 0.1 10*3/uL — ABNORMAL LOW (ref 0.7–4.0)
MCH: 32.3 pg (ref 26.0–34.0)
MCHC: 34.9 g/dL (ref 30.0–36.0)
MCV: 92.8 fL (ref 80.0–100.0)
Monocytes Absolute: 0.1 10*3/uL (ref 0.1–1.0)
Monocytes Relative: 5 %
Neutro Abs: 2 10*3/uL (ref 1.7–7.7)
Neutrophils Relative %: 88 %
Platelets: 91 10*3/uL — ABNORMAL LOW (ref 150–400)
RBC: 2.35 MIL/uL — ABNORMAL LOW (ref 3.87–5.11)
RDW: 20.9 % — ABNORMAL HIGH (ref 11.5–15.5)
WBC: 2.2 10*3/uL — ABNORMAL LOW (ref 4.0–10.5)
nRBC: 0 % (ref 0.0–0.2)

## 2019-06-21 LAB — BASIC METABOLIC PANEL
Anion gap: 10 (ref 5–15)
BUN: 10 mg/dL (ref 6–20)
CO2: 28 mmol/L (ref 22–32)
Calcium: 8.6 mg/dL — ABNORMAL LOW (ref 8.9–10.3)
Chloride: 100 mmol/L (ref 98–111)
Creatinine, Ser: 0.68 mg/dL (ref 0.44–1.00)
GFR calc Af Amer: >60 mL/min (ref >60)
GFR calc non Af Amer: >60 mL/min (ref >60)
Glucose, Bld: 223 mg/dL — ABNORMAL HIGH (ref 70–99)
Potassium: 3.4 mmol/L — ABNORMAL LOW (ref 3.5–5.1)
Sodium: 138 mmol/L (ref 135–145)

## 2019-06-21 LAB — HEPATIC FUNCTION PANEL
ALT: 11 U/L (ref 0–44)
AST: 16 U/L (ref 15–41)
Albumin: 2.7 g/dL — ABNORMAL LOW (ref 3.5–5.0)
Alkaline Phosphatase: 80 U/L (ref 38–126)
Bilirubin, Direct: 0.1 mg/dL (ref 0.0–0.2)
Indirect Bilirubin: 0.5 mg/dL (ref 0.3–0.9)
Total Bilirubin: 0.6 mg/dL (ref 0.3–1.2)
Total Protein: 6.6 g/dL (ref 6.5–8.1)

## 2019-06-21 LAB — PROCALCITONIN: Procalcitonin: <0.1 ng/mL

## 2019-06-21 LAB — C-REACTIVE PROTEIN: CRP: 19.4 mg/dL — ABNORMAL HIGH (ref <1.0)

## 2019-06-21 LAB — FERRITIN: Ferritin: 186 ng/mL (ref 11–307)

## 2019-06-21 LAB — FIBRIN DERIVATIVES D-DIMER (ARMC ONLY): Fibrin derivatives D-dimer (ARMC): 1088.05 ng/mL (FEU) — ABNORMAL HIGH (ref 0.00–499.00)

## 2019-06-21 MED ORDER — FENTANYL CITRATE (PF) 100 MCG/2ML IJ SOLN
50.0000 ug | Freq: Once | INTRAMUSCULAR | Status: AC
  Administered 2019-06-21: 23:00:00 50 ug via INTRAVENOUS
  Filled 2019-06-21: qty 2

## 2019-06-21 MED ORDER — DEXAMETHASONE SODIUM PHOSPHATE 10 MG/ML IJ SOLN
10.0000 mg | Freq: Once | INTRAMUSCULAR | Status: AC
  Administered 2019-06-21: 10 mg via INTRAVENOUS
  Filled 2019-06-21: qty 1

## 2019-06-21 MED ORDER — FENTANYL CITRATE (PF) 100 MCG/2ML IJ SOLN
50.0000 ug | Freq: Once | INTRAMUSCULAR | Status: AC
  Administered 2019-06-21: 19:00:00 50 ug via INTRAVENOUS
  Filled 2019-06-21: qty 2

## 2019-06-21 NOTE — Telephone Encounter (Signed)
Patient called stating her covid test was positive and she states she cannot walk more than 5 steps due to her being out of breath. Patient states her only other symptoms is a cough. Patient would like to know what the next steps are. Cough and out of breath Patient call back  [phone]

## 2019-06-21 NOTE — Assessment & Plan Note (Signed)
Continue Prozac.  PRN hydroxyzine.

## 2019-06-21 NOTE — ED Notes (Signed)
X-ray at bedside

## 2019-06-21 NOTE — Telephone Encounter (Signed)
Pt left vm returning a call for results

## 2019-06-21 NOTE — Patient Instructions (Signed)

## 2019-06-21 NOTE — Progress Notes (Signed)
Pulse (!) 118   Temp 100 F (37.8 C) (Oral)   Wt 186 lb (84.4 kg)   BMI 34.02 kg/m    Subjective:    Patient ID: Susan Houston, female    DOB: 13-Mar-1961, 58 y.o.   MRN: [id number]  HPI: Susan Houston is a 58 y.o. female  Chief Complaint  Patient presents with  . COVID    pt states she had a positive COVID test this morning    . This visit was completed via telephone due to the restrictions of the COVID-19 pandemic. All issues as above were discussed and addressed but no physical exam was performed. If it was felt that the patient should be evaluated in the office, they were directed there. The patient verbally consented to this visit. Patient was unable to complete an audio/visual visit due to Lack of equipment. Due to the catastrophic nature of the COVID-19 pandemic, this visit was done through audio contact only. . Location of the patient: home . Location of the provider: home . Those involved with this call:  . Provider: Marnee Guarneri, DNP . CMA: Yvonna Alanis, CMA . Front Desk/Registration: Jill Side  . Time spent on call: 22 minutes on the phone discussing health concerns. 15 minutes total spent in review of patient's record and preparation of their chart.  . I verified patient identity using two factors (patient name and date of birth). Patient consents verbally to being seen via telemedicine visit today.   COVID POSITIVE TESTING: Was scheduled for procedure and went for testing, returned positive.  Patient with underlying lung CA, moderate COPD with chronic hypoxic respiratory failure and chronic SOB (review of Dr. Mortimer Fries pulmonary notes, last visit 03/16/19).   On review of notes she does use O2 at 1-2L intermittently with exertion.  On Advair and Spiriva.  She is followed by oncology and last saw Dr. Rogue Bussing 06/14/2019.  Reviewed Dr. Rogue Bussing note from today, message was left by provider with patient and husband about concerns with positive testing and risk factors  (COPD and active chemo).   Patient reports her current symptoms started on Monday, cough and nasal drainage.  At baseline she uses 1-2L O2 intermittently throughout daytime, but reports she has had to tell her husband to keep turning O2 up and is now at 4L consistently with continued SOB.  At baseline SOB is with exertion, but current SOB is even when resting or talking.  She did go to church on Sunday, but has remained in house since this time. Fever: yes Cough: yes Shortness of breath: yes Wheezing: yes Chest pain: yes, with cough Chest tightness: yes Chest congestion: yes Nasal congestion: yes Runny nose: yes Post nasal drip: no Sneezing: no Sore throat: no Swollen glands: no Sinus pressure: no Headache: yes Face pain: no Toothache: no Ear pain: none Ear pressure: none Eyes red/itching:no Eye drainage/crusting: no  Vomiting: no Rash: no Fatigue: yes Sick contacts: unknown, she did go to church on Sunday Strep contacts: no  Context: worse   Relevant past medical, surgical, family and social history reviewed and updated as indicated. Interim medical history since our last visit reviewed. Allergies and medications reviewed and updated.  Review of Systems  Per HPI unless specifically indicated above     Objective:    Pulse (!) 118   Temp 100 F (37.8 C) (Oral)   Wt 186 lb (84.4 kg)   BMI 34.02 kg/m   Wt Readings from Last 3 Encounters:  06/21/19 186 lb (  84.4 kg)  06/14/19 186 lb (84.4 kg)  05/31/19 189 lb 12.8 oz (86.1 kg)    Physical Exam   Unable to perform face to face physical exam due to telephone visit only, but patient with obvious SOB with talking to provider and hoarseness.  She is alert and oriented to person, place, time.  Was able to follow instructions to open door for EMS team.  Results for orders placed or performed during the hospital encounter of 06/20/19  SARS CORONAVIRUS 2 (TAT 6-24 HRS) Nasopharyngeal Nasopharyngeal Swab   Specimen:  Nasopharyngeal Swab  Result Value Ref Range   SARS Coronavirus 2 POSITIVE (A) NEGATIVE      Assessment & Plan:   Problem List Items Addressed This Visit      Other   COVID-19 virus detected    Recent covid positive testing with major risk factors to include active chemo, COPD, and chronic hypoxic respiratory failure.  Concern for rapid decompensation.  Secure chat with Dr. Rogue Bussing performed, agree with plan for ER.  This PCP called patient husband reported plan, he is at work, agreed with ER and calling EMS.  Call placed to EMS to transport patient immediately to ER for further evaluation and treatment.  Stayed on telephone with patient until EMS arrived.         I discussed the assessment and treatment plan with the patient. The patient was provided an opportunity to ask questions and all were answered. The patient agreed with the plan and demonstrated an understanding of the instructions.   The patient was advised to call back or seek an in-person evaluation if the symptoms worsen or if the condition fails to improve as anticipated.   I provided 21+ minutes of time during this encounter talking to patient until EMS arrived at home.  Follow up plan: Return if symptoms worsen or fail to improve.

## 2019-06-21 NOTE — ED Notes (Signed)
Report given to [facility]. [facility] to pick up patient at 2230hrs.

## 2019-06-21 NOTE — ED Provider Notes (Signed)
[facility] Emergency Department Provider Note   ____________________________________________   I have reviewed the triage vital signs and the nursing notes.   HISTORY  Chief Complaint Shortness of Breath and COVID positive   History limited by: Not Limited   HPI Susan Houston is a 58 y.o. female who presents to the emergency department today because of concern for shortness of breath in the setting of recent positive COVID test. The patient says that for the past few days she has noticed increase shortness of breath. Does have oxygen at home but is only supposed to be using it at night. Has been using it during the day as well recently. Has been having body aches and fevers. Is undergoing chemotherapy for lung cancer and had last dose last week. Tried using home nebulizer the past few days without any significant improvement. Per EMS o2 saturation was in the 70s when they arrived.    Records reviewed. Per medical record review patient has a history of COPD  Past Medical History:  Diagnosis Date  . Anemia    vitamin b12 deficiency. no longer taking supplements  . Anxiety   . Arthritis   . Asthma   . Back pain   . Bruises easily   . Chronic leg pain    BILATERAL  . COPD (chronic obstructive pulmonary disease) (HCC)    no inhalers for over 1 year  . Depression   . Difficulty sleeping   . Diverticulitis   . Dyspnea    due to having part of lung removed in 2019  . Dysrhythmia 04/2018   brady episodes. cleared by dr. Clayborn Bigness  . Facet syndrome, lumbar 03/28/2015  . GERD (gastroesophageal reflux disease)   . Hemorrhoids   . History of kidney stones 04/2018   recently passed stone  . Hyperlipidemia   . Incontinence of urine   . Laryngeal cancer ([place]) 2009   LARYNX CANCER - CHEMO / RADIATION (NO SURGERY)   . Migraine   . Migraine without aura and without status migrainosus, not intractable 09/11/2016  . Mild cognitive impairment 11/23/2017  . MRSA  infection greater than 3 months ago    2008 right side of face  . Nerve damage    right leg. thinks this is from her TKR  . Nodule of lower lobe of right lung 04/22/2018  . Oxygen deficiency    2L/HS  . Primary cancer of right lower lobe of lung ([place]) 04/18/2018   Surgical resection of RLL  . Wears dentures    full upper and lower    Patient Active Problem List   Diagnosis Date Noted  . COVID-19 virus detected 06/21/2019  . Goals of care, counseling/discussion 04/24/2019  . MDD (major depressive disorder), recurrent episode, moderate ([place]) 04/05/2019  . GAD (generalized anxiety disorder) 04/05/2019  . Adenopathy   . Fibromyalgia syndrome 01/25/2019  . S/P revision of total knee, left 11/29/2018  . Costochondritis (Secondary area of Pain) 10/27/2018  . Primary insomnia 09/16/2018  . Chronic chest wall pain following surgery (Right) 09/08/2018  . Intercostal pain (Primary Area of Pain) (Right) 09/08/2018  . Intercostal neuralgia 09/08/2018  . 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 Chronic post-thoracotomy pain (Primary Area of Pain) (Right) 08/29/2018  . Neurogenic pain 08/29/2018  . Vitamin D insufficiency 08/29/2018  . Rib pain (Primary Area of Pain) (Right) 08/18/2018  . Incisional pain (Primary Area of Pain) 08/18/2018  . Chronic pain syndrome 08/18/2018  . Long term current use of opiate  analgesic 08/18/2018  . Pharmacologic therapy 08/18/2018  . Disorder of skeletal system 08/18/2018  . Problems influencing health status 08/18/2018  . Controlled substance agreement signed 06/28/2018  . Primary cancer of right lower lobe of lung ([place]) 05/27/2018  . Lung cancer ([place]) 05/16/2018  . Laryngeal cancer ([place]) 04/22/2018  . Mass of lower lobe of right lung 04/22/2018  . Vitamin B12 deficiency 12/15/2017  . Frequent falls 11/23/2017  . MCI (mild cognitive impairment) 11/23/2017  . Migraine without aura and without status migrainosus, not intractable 09/11/2016  .  Hyperlipidemia   . COPD (chronic obstructive pulmonary disease) ([place])   . Severe depression ([place]) 12/24/2015  . Chronic anxiety 12/24/2015  . Coarse tremors 12/24/2015  . Lumbar facet syndrome 03/28/2015  . DDD (degenerative disc disease), lumbar 03/05/2015  . DDD (degenerative disc disease), lumbosacral 02/23/2015  . Sacroiliac joint dysfunction 02/23/2015  . Saphenous neuralgia 09/24/2014    Past Surgical History:  Procedure Laterality Date  . ABDOMINAL HYSTERECTOMY  1983  . BLADDER SUSPENSION  2014  . CATARACT EXTRACTION W/PHACO Left 05/11/2018   Procedure: CATARACT EXTRACTION PHACO AND INTRAOCULAR LENS PLACEMENT ([place]) LEFT;  Surgeon: Leandrew Koyanagi, MD;  Location: [place];  Service: Ophthalmology;  Laterality: Left;  PREFERS EARLY  . CATARACT EXTRACTION W/PHACO Right 08/11/2018   Procedure: CATARACT EXTRACTION PHACO AND INTRAOCULAR LENS PLACEMENT (I[facility]);  Surgeon: Leandrew Koyanagi, MD;  Location: [facility];  Service: Ophthalmology;  Laterality: Right;  CDE 5:02 Total Time 01:35 Fluid lot # [id number]  . CHOLECYSTECTOMY  1998  . COLONOSCOPY    . COLONOSCOPY WITH PROPOFOL N/A 04/28/2019   Procedure: COLONOSCOPY WITH PROPOFOL;  Surgeon: Jonathon Bellows, MD;  Location: [facility] ENDOSCOPY;  Service: Gastroenterology;  Laterality: N/A;  . ENDOBRONCHIAL ULTRASOUND N/A 03/20/2019   Procedure: ENDOBRONCHIAL ULTRASOUND;  Surgeon: Flora Lipps, MD;  Location: [facility];  Service: Cardiopulmonary;  Laterality: N/A;  . ESOPHAGOGASTRODUODENOSCOPY  2015  . ESOPHAGOGASTRODUODENOSCOPY (EGD) WITH PROPOFOL N/A 11/20/2016   Procedure: ESOPHAGOGASTRODUODENOSCOPY (EGD) WITH PROPOFOL;  Surgeon: Jonathon Bellows, MD;  Location: [facility] ENDOSCOPY;  Service: Endoscopy;  Laterality: N/A;  . ESOPHAGOGASTRODUODENOSCOPY (EGD) WITH PROPOFOL N/A 04/28/2019   Procedure: ESOPHAGOGASTRODUODENOSCOPY (EGD) WITH PROPOFOL;  Surgeon: Jonathon Bellows, MD;  Location: [facility] ENDOSCOPY;  Service: Gastroenterology;  Laterality: N/A;  Pt  needs appointment after 9:30am  . EXCISION NEUROMA Bilateral 09/24/2014   Procedure: BILATERAL OPEN SAPHENOUS NEURECTOMIES AND OPEN LEFT PERIPATELLA OSETOPHYTECTOMY;  Surgeon: Mauri Pole, MD;  Location: [facility];  Service: Orthopedics;  Laterality: Bilateral;  . EYE SURGERY Left 05/11/2018   cataract extraction  . JOINT REPLACEMENT  2009/2010   BIL TOTAL KNEES  . JOINT REPLACEMENT  2020   left  . PORTACATH PLACEMENT N/A 04/05/2019   Procedure: INSERTION PORT-A-CATH;  Surgeon: Nestor Lewandowsky, MD;  Location: [facility];  Service: General;  Laterality: N/A;  . THORACOTOMY/LOBECTOMY Right 05/16/2018   Procedure: THORACOTOMY/POSSIBLE LOBECTOMY;  Surgeon: Nestor Lewandowsky, MD;  Location: [facility];  Service: Thoracic;  Laterality: Right;  . TONSILLECTOMY    . TOTAL KNEE REVISION Left 11/29/2018   Procedure: Left TOTAL KNEE REVISION;  Surgeon: Hessie Knows, MD;  Location: [facility];  Service: Orthopedics;  Laterality: Left;  Marland Kitchen VIDEO BRONCHOSCOPY N/A 05/16/2018   Procedure: PREOP  BRONCHOSCOPY;  Surgeon: Nestor Lewandowsky, MD;  Location: [facility];  Service: Thoracic;  Laterality: N/A;    Prior to Admission medications   Medication Sig Start Date End Date Taking? Authorizing Provider  albuterol (PROVENTIL HFA;VENTOLIN HFA) 108 (90 Base) MCG/ACT inhaler Inhale 2  puffs into the lungs every 4 (four) hours as needed for wheezing or shortness of breath. 07/01/18   Flora Lipps, MD  albuterol (PROVENTIL) (2.5 MG/3ML) 0.083% nebulizer solution Take 3 mLs (2.5 mg total) by nebulization every 4 (four) hours as needed for wheezing or shortness of breath. 01/20/19   Flora Lipps, MD  aspirin EC 81 MG tablet Take 81 mg by mouth daily.    [provider]  aspirin-acetaminophen-caffeine (EXCEDRIN MIGRAINE) [phone] MG tablet Take 1 tablet by mouth daily as needed for headache.    [provider]  chlorpheniramine-HYDROcodone (TUSSIONEX) 10-8 MG/5ML SUER Take 5 mLs by mouth every 12 (twelve) hours as needed for  cough. 06/14/19   Cammie Sickle, MD  clonazePAM (KLONOPIN) 0.5 MG tablet TAKE 1/2 TO 1 TABLET BY MOUTH DAILY AS NEEDED FOR ANXIETY. TRY TO LIMIT USE AND USE ONLY FOR SEVERE ANXIETY AND SLEEP PROBLEMS 05/22/19   Ursula Alert, MD  estradiol (ESTRACE) 1 MG tablet Take 1 mg by mouth daily.  01/17/19   [provider]  FLUoxetine (PROZAC) 40 MG capsule Take 2 capsules (80 mg total) by mouth daily. 05/30/19   Ursula Alert, MD  hydrOXYzine (VISTARIL) 25 MG capsule TAKE 1 CAPSULE(25 MG) BY MOUTH TWICE DAILY AS NEEDED FOR SEVERE ANXIETY OR SYMPTOMS 06/13/19   Ursula Alert, MD  lidocaine-prilocaine (EMLA) cream Apply to affected area once 04/10/19   Cammie Sickle, MD  loperamide (IMODIUM) 2 MG capsule Take 2 mg by mouth as needed for diarrhea or loose stools.    [provider]  magic mouthwash SOLN Take 5 mLs by mouth 3 (three) times daily as needed for mouth pain. 04/25/19   Cammie Sickle, MD  nystatin cream (MYCOSTATIN) Apply 1 application topically 2 (two) times daily. 02/23/19   Johnson, Megan P, DO  omeprazole (PRILOSEC) 40 MG capsule Take 1 capsule (40 mg total) by mouth daily. Patient taking differently: Take 40 mg by mouth every morning.  01/16/19   Jonathon Bellows, MD  ondansetron (ZOFRAN) 8 MG tablet Take 1 tablet (8 mg total) by mouth 2 (two) times daily as needed for refractory nausea / vomiting. Start on day 3 after chemo. 04/10/19   Cammie Sickle, MD  oxyCODONE-acetaminophen (PERCOCET) 5-325 MG tablet Take 1 tablet by mouth every 8 (eight) hours as needed for severe pain. 05/17/19 05/16/20  Cammie Sickle, MD  OXYGEN Inhale 2 L into the lungs at bedtime.    [provider]  predniSONE (DELTASONE) 20 MG tablet Take 3 pills once a day with food x 1 week; and then 1 pill a day. 06/14/19   Cammie Sickle, MD  pregabalin (LYRICA) 75 MG capsule Take 1 capsule (75 mg total) by mouth 3 (three) times daily. 06/05/19 12/02/19  Milinda Pointer,  MD  prochlorperazine (COMPAZINE) 10 MG tablet Take 1 tablet (10 mg total) by mouth every 6 (six) hours as needed (Nausea or vomiting). 04/10/19   Cammie Sickle, MD  sucralfate (CARAFATE) 1 g tablet Take 1 tablet (1 g total) by mouth 3 (three) times daily. Dissolve in 3-4 tbsp warm water, swish and swallow. 04/18/19   Noreene Filbert, MD  Suvorexant (BELSOMRA) 20 MG TABS Take 20 mg by mouth at bedtime. 05/18/19   Ursula Alert, MD  traMADol (ULTRAM) 50 MG tablet Take 1 tablet (50 mg total) by mouth every 8 (eight) hours as needed. 05/31/19   Cammie Sickle, MD    Allergies Morphine and related, Cymbalta [duloxetine hcl], Adhesive [  tape], and Lexapro [escitalopram oxalate]  Family History  Problem Relation Age of Onset  . Asthma Mother   . Diabetes Mother   . Hyperlipidemia Mother   . Hypertension Mother   . Cirrhosis Mother        Non-alcoholic  . Arthritis Father   . Cancer Father        Bone  . Hyperlipidemia Father   . Hypertension Father   . Anxiety disorder Sister   . Depression Sister   . Kidney Stones Son   . Stroke Maternal Grandmother   . Breast cancer Maternal Grandmother 70  . Cancer Maternal Grandfather        lung  . Pneumonia Paternal Grandmother   . Alzheimer's disease Paternal Grandfather   . Diabetes Sister   . Gout Sister   . Hypertension Sister   . Alcohol abuse Brother   . Heart disease Brother        massive MI    Social History Social History   Tobacco Use  . Smoking status: Former Smoker    Packs/day: 0.50    Types: Cigarettes    Quit date: 04/07/2016    Years since quitting: 3.2  . Smokeless tobacco: Never Used  . Tobacco comment: patient has not smoked x 10 days.06/28/17 chantix  Substance Use Topics  . Alcohol use: No    Alcohol/week: 0.0 standard drinks  . Drug use: No    Review of Systems Constitutional: No fever/chills Eyes: No visual changes. ENT: No sore throat. Cardiovascular: Denies chest pain. Respiratory:  Positive for shortness of breath. Gastrointestinal: No abdominal pain.  No nausea, no vomiting.  No diarrhea.   Genitourinary: Negative for dysuria. Musculoskeletal: Positive for body aches. Skin: Negative for rash. Neurological: Negative for headaches, focal weakness or numbness.  ____________________________________________   PHYSICAL EXAM:  VITAL SIGNS: ED Triage Vitals  Enc Vitals Group     BP 06/21/19 1603 129/80     Pulse Rate 06/21/19 1603 (!) 106     Resp 06/21/19 1603 20     Temp 06/21/19 1603 98.9 F (37.2 C)     Temp Source 06/21/19 1603 Oral     SpO2 06/21/19 1603 100 %     Weight 06/21/19 1605 186 lb (84.4 kg)     Height 06/21/19 1605 5\' 2"  (1.575 m)     Head Circumference --      Peak Flow --      Pain Score 06/21/19 1604 8   Constitutional: Alert and oriented.  Eyes: Conjunctivae are normal.  ENT      Head: Normocephalic and atraumatic.      Nose: No congestion/rhinnorhea.      Mouth/Throat: Mucous membranes are moist.      Neck: No stridor. Hematological/Lymphatic/Immunilogical: No cervical lymphadenopathy. Cardiovascular: Normal rate, regular rhythm.  No murmurs, rubs, or gallops.  Respiratory: Normal respiratory effort without tachypnea nor retractions. Breath sounds are clear and equal bilaterally. No wheezes/rales/rhonchi. Gastrointestinal: Soft and non tender. No rebound. No guarding.  Genitourinary: Deferred Musculoskeletal: Normal range of motion in all extremities. No lower extremity edema. Neurologic:  Normal speech and language. No gross focal neurologic deficits are appreciated.  Skin:  Skin is warm, dry and intact. No rash noted. Psychiatric: Mood and affect are normal. Speech and behavior are normal. Patient exhibits appropriate insight and judgment.  ____________________________________________    LABS (pertinent positives/negatives)  BMP na 138, k 3.4, glu 223, cr 0.68 CBC wbc 2.2, hgb 7.6, plt  91  ____________________________________________  EKG  I, Nance Pear, attending physician, personally viewed and interpreted this EKG  EKG Time: 1608 Rate: 105 Rhythm: sinus tachycardia Axis: normal Intervals: qtc 458 QRS: narrow ST changes: no st elevation, t wave inversion V1 Impression: abnormal ekg  ____________________________________________    RADIOLOGY  CXR Patchy airspace disease  ____________________________________________   PROCEDURES  Procedures  ____________________________________________   INITIAL IMPRESSION / ASSESSMENT AND PLAN / ED COURSE  Pertinent labs & imaging results that were available during my care of the patient were reviewed by me and considered in my medical decision making (see chart for details).   Patient presented to the emergency department today with shortness of breath in the setting of COVID positive status.  Patient does have increased oxygen requirement here.  Chest x-ray is consistent with [place].  Will plan on admission and transfer to [facility].  Patient was given steroids here.   ____________________________________________   FINAL CLINICAL IMPRESSION(S) / ED DIAGNOSES  Final diagnoses:  Hypoxia  SOB (shortness of breath)  COVID-19     Note: This dictation was prepared with Dragon dictation. Any transcriptional errors that result from this process are unintentional     Nance Pear, MD 06/21/19 2345

## 2019-06-21 NOTE — Assessment & Plan Note (Signed)
Continue klonopin

## 2019-06-21 NOTE — ED Triage Notes (Signed)
Arrives today c/o increased SOB. Pt also reporting sx of green productive cough, body aches and chills since Monday. Pt tested positive for COVID yesterday, was called with positive results today. Pt had chemo last week. Wears 2L [place] at baseline, EMS reports RA saturation in 70's. Pt arrives on 6L [place]. Pt in NAD at this time but does appear fatigued. WOB normal while in bed. No antipyretics taken today. VSS with EMS.CBG WNL

## 2019-06-21 NOTE — Telephone Encounter (Signed)
Wearing 24hr oxygen on 4 and is still having hard time breathing. Appt scheduled for this afternoon.

## 2019-06-21 NOTE — ED Notes (Signed)
Consent for transfer given verbally due to infection risk.

## 2019-06-21 NOTE — Assessment & Plan Note (Signed)
Continue prozac. 

## 2019-06-21 NOTE — Assessment & Plan Note (Signed)
Recent covid positive testing with major risk factors to include active chemo, COPD, and chronic hypoxic respiratory failure.  Concern for rapid decompensation.  Secure chat with Dr. Rogue Bussing performed, agree with plan for ER.  This PCP called patient husband reported plan, he is at work, agreed with ER and calling EMS.  Call placed to EMS to transport patient immediately to ER for further evaluation and treatment.  Stayed on telephone with patient until EMS arrived.

## 2019-06-21 NOTE — Telephone Encounter (Signed)
I left voicemail pt and and husband-to discuss my concerns regarding positive COVID test.  Left a message that if her breathing gets worse or not any better she should go to the emergency room.  Patient has an appointment with PCP office over the phone this afternoon.  Patient is at risk for complications from COVID-given history of COPD/recent active chemotherapy.

## 2019-06-21 NOTE — Subjective & Objective (Addendum)
CC: SOB HPI: 58 year old female with a history of COPD, lung cancer undergoing active chemotherapy, chronic respiratory failure on home oxygen at night, chronic pain syndrome on chronic narcotics, chronic anxiety, depression presents to [facility] as a transfer from [facility].  Patient was undergoing routine COVID-19 testing due to upcoming EGD on Friday, June 23, 2019.  Her COVID-19 test was positive.  Patient states that she has been short of breath for the last several days.  She started using her nocturnal oxygen during the day.  She normally uses 2 L at night.  She is increase this up to 4 L continuously.  Patient has had chills.  No documented fever.  She has had a productive cough.  She thought most of her symptoms related to her metastatic cancer.  Patient states that she is only been to the doctor and to church this past Sunday.  She is not been on the house since then.  she denies any contact with any COVID positive patients.  Patient underwent chemotherapy about 2 weeks ago.  She had shortness of breath that was worsening over the last several days.  She has been using her nocturnal oxygen during the day.  Patient activated EMS.  Reportedly patient's pulse oximetry was in the 70% range when she was evaluated by EMS.  She has been having fevers and chills.  Patient states that she has been on prednisone for the last week.  In the ER, patient was given 10 mg of IV Decadron.  Her chest x-ray demonstrated diffuse patchy airspace interstitial opacities of bilateral lower lobes.  Also extensive interstitial disease of the left lung.  Patient had been tested for coronavirus yesterday and had been notified of her positive test by her PCP.  CBC today demonstrated leukopenia with a white count of 2.2 with an absolute lymphocyte count of 100.  LFTs were normal including AST of 16 and ALT of 11.  Procalcitonin was normal at less than 0.01.  CRP is pending.  Ferritin was normal at  186.  [facility] was contacted for admission.

## 2019-06-21 NOTE — Assessment & Plan Note (Signed)
Patient underwent chemotherapy about 2 weeks ago.

## 2019-06-21 NOTE — Telephone Encounter (Signed)
Called pt regarding positive COVID test results and to reschedule endoscopy procedure to a later date.  Unable to contact, LVM to return call

## 2019-06-21 NOTE — ED Notes (Signed)
Pt assisted to use bathroom- pt out of breath with exertion

## 2019-06-21 NOTE — Assessment & Plan Note (Signed)
Anemia appears to be worsening. Type and screen in the AM.  Could be related to recent chemotherapy.

## 2019-06-21 NOTE — Assessment & Plan Note (Addendum)
Admit to progressive care bed. COVID precautions. Continue with IV decadron 6 mg daily. RT for assessment. Given worsening hypoxia, will start remdesivir.  Hold off on Actemra for now.

## 2019-06-21 NOTE — Assessment & Plan Note (Signed)
Continue supplemental oxygen to keep O2 saturations greater than 90%.

## 2019-06-21 NOTE — Assessment & Plan Note (Signed)
Continue Percocet

## 2019-06-21 NOTE — Assessment & Plan Note (Signed)
Continue Percocet, Lyrica

## 2019-06-21 NOTE — Telephone Encounter (Signed)
Spoke with pt and informed her of COVID test results and explained that we have notified her PCP also advised pt to contact her PCP as well for further instructions. Pt agrees and we have rescheduled pt endoscopy procedure to 07-14-19.

## 2019-06-21 NOTE — Assessment & Plan Note (Signed)
Will have RT assess patient.  Continue Combivent inhaler.

## 2019-06-22 ENCOUNTER — Encounter (HOSPITAL_COMMUNITY): Payer: Self-pay

## 2019-06-22 ENCOUNTER — Telehealth: Payer: Self-pay | Admitting: Internal Medicine

## 2019-06-22 ENCOUNTER — Inpatient Hospital Stay (HOSPITAL_COMMUNITY)
Admission: AD | Admit: 2019-06-22 | Discharge: 2019-07-20 | DRG: 208 | Disposition: E | Payer: Medicare Other | Source: Other Acute Inpatient Hospital | Attending: Internal Medicine | Admitting: Internal Medicine

## 2019-06-22 DIAGNOSIS — J449 Chronic obstructive pulmonary disease, unspecified: Secondary | ICD-10-CM | POA: Diagnosis not present

## 2019-06-22 DIAGNOSIS — A401 Sepsis due to streptococcus, group B: Secondary | ICD-10-CM | POA: Diagnosis not present

## 2019-06-22 DIAGNOSIS — C3431 Malignant neoplasm of lower lobe, right bronchus or lung: Secondary | ICD-10-CM | POA: Diagnosis present

## 2019-06-22 DIAGNOSIS — J80 Acute respiratory distress syndrome: Secondary | ICD-10-CM

## 2019-06-22 DIAGNOSIS — R062 Wheezing: Secondary | ICD-10-CM

## 2019-06-22 DIAGNOSIS — J155 Pneumonia due to Escherichia coli: Secondary | ICD-10-CM | POA: Diagnosis not present

## 2019-06-22 DIAGNOSIS — Z96653 Presence of artificial knee joint, bilateral: Secondary | ICD-10-CM | POA: Diagnosis not present

## 2019-06-22 DIAGNOSIS — Z820 Family history of epilepsy and other diseases of the nervous system: Secondary | ICD-10-CM

## 2019-06-22 DIAGNOSIS — N179 Acute kidney failure, unspecified: Secondary | ICD-10-CM | POA: Diagnosis not present

## 2019-06-22 DIAGNOSIS — E785 Hyperlipidemia, unspecified: Secondary | ICD-10-CM | POA: Diagnosis present

## 2019-06-22 DIAGNOSIS — I2510 Atherosclerotic heart disease of native coronary artery without angina pectoris: Secondary | ICD-10-CM | POA: Diagnosis present

## 2019-06-22 DIAGNOSIS — Z79891 Long term (current) use of opiate analgesic: Secondary | ICD-10-CM

## 2019-06-22 DIAGNOSIS — J189 Pneumonia, unspecified organism: Secondary | ICD-10-CM

## 2019-06-22 DIAGNOSIS — J069 Acute upper respiratory infection, unspecified: Secondary | ICD-10-CM | POA: Diagnosis not present

## 2019-06-22 DIAGNOSIS — Z8614 Personal history of Methicillin resistant Staphylococcus aureus infection: Secondary | ICD-10-CM

## 2019-06-22 DIAGNOSIS — T451X5A Adverse effect of antineoplastic and immunosuppressive drugs, initial encounter: Secondary | ICD-10-CM | POA: Diagnosis present

## 2019-06-22 DIAGNOSIS — Z7982 Long term (current) use of aspirin: Secondary | ICD-10-CM

## 2019-06-22 DIAGNOSIS — Z823 Family history of stroke: Secondary | ICD-10-CM

## 2019-06-22 DIAGNOSIS — R6521 Severe sepsis with septic shock: Secondary | ICD-10-CM | POA: Diagnosis not present

## 2019-06-22 DIAGNOSIS — J9690 Respiratory failure, unspecified, unspecified whether with hypoxia or hypercapnia: Secondary | ICD-10-CM | POA: Diagnosis not present

## 2019-06-22 DIAGNOSIS — L304 Erythema intertrigo: Secondary | ICD-10-CM | POA: Diagnosis not present

## 2019-06-22 DIAGNOSIS — J9601 Acute respiratory failure with hypoxia: Secondary | ICD-10-CM | POA: Diagnosis not present

## 2019-06-22 DIAGNOSIS — Z885 Allergy status to narcotic agent status: Secondary | ICD-10-CM

## 2019-06-22 DIAGNOSIS — J154 Pneumonia due to other streptococci: Secondary | ICD-10-CM | POA: Diagnosis not present

## 2019-06-22 DIAGNOSIS — K5903 Drug induced constipation: Secondary | ICD-10-CM | POA: Diagnosis not present

## 2019-06-22 DIAGNOSIS — Z6835 Body mass index (BMI) 35.0-35.9, adult: Secondary | ICD-10-CM

## 2019-06-22 DIAGNOSIS — M25519 Pain in unspecified shoulder: Secondary | ICD-10-CM | POA: Diagnosis not present

## 2019-06-22 DIAGNOSIS — Z87442 Personal history of urinary calculi: Secondary | ICD-10-CM

## 2019-06-22 DIAGNOSIS — Z9981 Dependence on supplemental oxygen: Secondary | ICD-10-CM | POA: Diagnosis not present

## 2019-06-22 DIAGNOSIS — J1289 Other viral pneumonia: Secondary | ICD-10-CM | POA: Diagnosis present

## 2019-06-22 DIAGNOSIS — Z803 Family history of malignant neoplasm of breast: Secondary | ICD-10-CM

## 2019-06-22 DIAGNOSIS — Z8521 Personal history of malignant neoplasm of larynx: Secondary | ICD-10-CM | POA: Diagnosis not present

## 2019-06-22 DIAGNOSIS — Z9221 Personal history of antineoplastic chemotherapy: Secondary | ICD-10-CM

## 2019-06-22 DIAGNOSIS — J9621 Acute and chronic respiratory failure with hypoxia: Secondary | ICD-10-CM | POA: Diagnosis present

## 2019-06-22 DIAGNOSIS — Z825 Family history of asthma and other chronic lower respiratory diseases: Secondary | ICD-10-CM

## 2019-06-22 DIAGNOSIS — Z96659 Presence of unspecified artificial knee joint: Secondary | ICD-10-CM | POA: Diagnosis not present

## 2019-06-22 DIAGNOSIS — F331 Major depressive disorder, recurrent, moderate: Secondary | ICD-10-CM | POA: Diagnosis present

## 2019-06-22 DIAGNOSIS — Z66 Do not resuscitate: Secondary | ICD-10-CM | POA: Diagnosis not present

## 2019-06-22 DIAGNOSIS — Z8349 Family history of other endocrine, nutritional and metabolic diseases: Secondary | ICD-10-CM

## 2019-06-22 DIAGNOSIS — Z7952 Long term (current) use of systemic steroids: Secondary | ICD-10-CM

## 2019-06-22 DIAGNOSIS — B372 Candidiasis of skin and nail: Secondary | ICD-10-CM | POA: Diagnosis not present

## 2019-06-22 DIAGNOSIS — Z515 Encounter for palliative care: Secondary | ICD-10-CM | POA: Diagnosis not present

## 2019-06-22 DIAGNOSIS — Z4682 Encounter for fitting and adjustment of non-vascular catheter: Secondary | ICD-10-CM | POA: Diagnosis not present

## 2019-06-22 DIAGNOSIS — Z8249 Family history of ischemic heart disease and other diseases of the circulatory system: Secondary | ICD-10-CM

## 2019-06-22 DIAGNOSIS — Z818 Family history of other mental and behavioral disorders: Secondary | ICD-10-CM

## 2019-06-22 DIAGNOSIS — J440 Chronic obstructive pulmonary disease with acute lower respiratory infection: Secondary | ICD-10-CM | POA: Diagnosis present

## 2019-06-22 DIAGNOSIS — G894 Chronic pain syndrome: Secondary | ICD-10-CM | POA: Diagnosis present

## 2019-06-22 DIAGNOSIS — D638 Anemia in other chronic diseases classified elsewhere: Secondary | ICD-10-CM

## 2019-06-22 DIAGNOSIS — F419 Anxiety disorder, unspecified: Secondary | ICD-10-CM | POA: Diagnosis not present

## 2019-06-22 DIAGNOSIS — Z87891 Personal history of nicotine dependence: Secondary | ICD-10-CM

## 2019-06-22 DIAGNOSIS — Z79899 Other long term (current) drug therapy: Secondary | ICD-10-CM | POA: Diagnosis not present

## 2019-06-22 DIAGNOSIS — T402X5A Adverse effect of other opioids, initial encounter: Secondary | ICD-10-CM | POA: Diagnosis not present

## 2019-06-22 DIAGNOSIS — C3490 Malignant neoplasm of unspecified part of unspecified bronchus or lung: Secondary | ICD-10-CM | POA: Diagnosis not present

## 2019-06-22 DIAGNOSIS — U071 COVID-19: Secondary | ICD-10-CM | POA: Diagnosis not present

## 2019-06-22 DIAGNOSIS — G893 Neoplasm related pain (acute) (chronic): Secondary | ICD-10-CM | POA: Diagnosis present

## 2019-06-22 DIAGNOSIS — Z9049 Acquired absence of other specified parts of digestive tract: Secondary | ICD-10-CM

## 2019-06-22 DIAGNOSIS — R7982 Elevated C-reactive protein (CRP): Secondary | ICD-10-CM | POA: Diagnosis present

## 2019-06-22 DIAGNOSIS — F411 Generalized anxiety disorder: Secondary | ICD-10-CM | POA: Diagnosis present

## 2019-06-22 DIAGNOSIS — Z888 Allergy status to other drugs, medicaments and biological substances status: Secondary | ICD-10-CM

## 2019-06-22 DIAGNOSIS — D696 Thrombocytopenia, unspecified: Secondary | ICD-10-CM | POA: Diagnosis present

## 2019-06-22 DIAGNOSIS — A491 Streptococcal infection, unspecified site: Secondary | ICD-10-CM

## 2019-06-22 DIAGNOSIS — J45909 Unspecified asthma, uncomplicated: Secondary | ICD-10-CM | POA: Diagnosis not present

## 2019-06-22 DIAGNOSIS — Z8261 Family history of arthritis: Secondary | ICD-10-CM

## 2019-06-22 DIAGNOSIS — Y92239 Unspecified place in hospital as the place of occurrence of the external cause: Secondary | ICD-10-CM | POA: Diagnosis not present

## 2019-06-22 DIAGNOSIS — C799 Secondary malignant neoplasm of unspecified site: Secondary | ICD-10-CM | POA: Diagnosis not present

## 2019-06-22 DIAGNOSIS — D630 Anemia in neoplastic disease: Secondary | ICD-10-CM | POA: Diagnosis present

## 2019-06-22 DIAGNOSIS — Z902 Acquired absence of lung [part of]: Secondary | ICD-10-CM

## 2019-06-22 DIAGNOSIS — Z9841 Cataract extraction status, right eye: Secondary | ICD-10-CM

## 2019-06-22 DIAGNOSIS — R0902 Hypoxemia: Secondary | ICD-10-CM | POA: Diagnosis not present

## 2019-06-22 DIAGNOSIS — Z811 Family history of alcohol abuse and dependence: Secondary | ICD-10-CM

## 2019-06-22 DIAGNOSIS — R000 Tachycardia, unspecified: Secondary | ICD-10-CM | POA: Diagnosis not present

## 2019-06-22 DIAGNOSIS — K219 Gastro-esophageal reflux disease without esophagitis: Secondary | ICD-10-CM | POA: Diagnosis not present

## 2019-06-22 DIAGNOSIS — Z961 Presence of intraocular lens: Secondary | ICD-10-CM | POA: Diagnosis present

## 2019-06-22 DIAGNOSIS — A419 Sepsis, unspecified organism: Secondary | ICD-10-CM

## 2019-06-22 DIAGNOSIS — Z923 Personal history of irradiation: Secondary | ICD-10-CM

## 2019-06-22 DIAGNOSIS — D61810 Antineoplastic chemotherapy induced pancytopenia: Secondary | ICD-10-CM | POA: Diagnosis not present

## 2019-06-22 DIAGNOSIS — Z9842 Cataract extraction status, left eye: Secondary | ICD-10-CM

## 2019-06-22 DIAGNOSIS — Z833 Family history of diabetes mellitus: Secondary | ICD-10-CM

## 2019-06-22 DIAGNOSIS — Z90710 Acquired absence of both cervix and uterus: Secondary | ICD-10-CM

## 2019-06-22 DIAGNOSIS — A4151 Sepsis due to Escherichia coli [E. coli]: Secondary | ICD-10-CM | POA: Diagnosis not present

## 2019-06-22 DIAGNOSIS — E669 Obesity, unspecified: Secondary | ICD-10-CM | POA: Diagnosis present

## 2019-06-22 LAB — D-DIMER, QUANTITATIVE: D-Dimer, Quant: 1.25 ug/mL-FEU — ABNORMAL HIGH (ref 0.00–0.50)

## 2019-06-22 LAB — COMPREHENSIVE METABOLIC PANEL
ALT: 10 U/L (ref 0–44)
AST: 12 U/L — ABNORMAL LOW (ref 15–41)
Albumin: 2.7 g/dL — ABNORMAL LOW (ref 3.5–5.0)
Alkaline Phosphatase: 76 U/L (ref 38–126)
Anion gap: 10 (ref 5–15)
BUN: 14 mg/dL (ref 6–20)
CO2: 29 mmol/L (ref 22–32)
Calcium: 8.8 mg/dL — ABNORMAL LOW (ref 8.9–10.3)
Chloride: 100 mmol/L (ref 98–111)
Creatinine, Ser: 0.62 mg/dL (ref 0.44–1.00)
GFR calc Af Amer: >60 mL/min (ref >60)
GFR calc non Af Amer: >60 mL/min (ref >60)
Glucose, Bld: 146 mg/dL — ABNORMAL HIGH (ref 70–99)
Potassium: 3.6 mmol/L (ref 3.5–5.1)
Sodium: 139 mmol/L (ref 135–145)
Total Bilirubin: 0.5 mg/dL (ref 0.3–1.2)
Total Protein: 6.6 g/dL (ref 6.5–8.1)

## 2019-06-22 LAB — CBC WITH DIFFERENTIAL/PLATELET
Abs Immature Granulocytes: 0.02 10*3/uL (ref 0.00–0.07)
Basophils Absolute: 0 10*3/uL (ref 0.0–0.1)
Basophils Relative: 0 %
Eosinophils Absolute: 0 10*3/uL (ref 0.0–0.5)
Eosinophils Relative: 0 %
HCT: 22.6 % — ABNORMAL LOW (ref 36.0–46.0)
Hemoglobin: 7.2 g/dL — ABNORMAL LOW (ref 12.0–15.0)
Immature Granulocytes: 1 %
Lymphocytes Relative: 4 %
Lymphs Abs: 0.2 10*3/uL — ABNORMAL LOW (ref 0.7–4.0)
MCH: 30 pg (ref 26.0–34.0)
MCHC: 31.9 g/dL (ref 30.0–36.0)
MCV: 94.2 fL (ref 80.0–100.0)
Monocytes Absolute: 0.3 10*3/uL (ref 0.1–1.0)
Monocytes Relative: 7 %
Neutro Abs: 3.5 10*3/uL (ref 1.7–7.7)
Neutrophils Relative %: 88 %
Platelets: 87 10*3/uL — ABNORMAL LOW (ref 150–400)
RBC: 2.4 MIL/uL — ABNORMAL LOW (ref 3.87–5.11)
RDW: 20.9 % — ABNORMAL HIGH (ref 11.5–15.5)
WBC: 4 10*3/uL (ref 4.0–10.5)
nRBC: 0 % (ref 0.0–0.2)

## 2019-06-22 LAB — LACTATE DEHYDROGENASE: LDH: 257 U/L — ABNORMAL HIGH (ref 98–192)

## 2019-06-22 LAB — PREPARE RBC (CROSSMATCH)

## 2019-06-22 LAB — ABO/RH: ABO/RH(D): B NEG

## 2019-06-22 LAB — HIV ANTIBODY (ROUTINE TESTING W REFLEX): HIV Screen 4th Generation wRfx: NONREACTIVE

## 2019-06-22 LAB — FERRITIN: Ferritin: 142 ng/mL (ref 11–307)

## 2019-06-22 MED ORDER — FLUOXETINE HCL 20 MG PO CAPS
80.0000 mg | ORAL_CAPSULE | Freq: Every day | ORAL | Status: DC
  Administered 2019-06-22 – 2019-07-01 (×10): 80 mg via ORAL
  Filled 2019-06-22 (×12): qty 4

## 2019-06-22 MED ORDER — ENOXAPARIN SODIUM 40 MG/0.4ML ~~LOC~~ SOLN
40.0000 mg | SUBCUTANEOUS | Status: DC
  Administered 2019-06-22 – 2019-06-24 (×3): 40 mg via SUBCUTANEOUS
  Filled 2019-06-22 (×3): qty 0.4

## 2019-06-22 MED ORDER — SODIUM CHLORIDE 0.9 % IV SOLN
200.0000 mg | Freq: Once | INTRAVENOUS | Status: AC
  Administered 2019-06-22: 05:00:00 200 mg via INTRAVENOUS
  Filled 2019-06-22: qty 40

## 2019-06-22 MED ORDER — SUVOREXANT 20 MG PO TABS: 20.0000 mg | ORAL_TABLET | Freq: Every day | ORAL | Status: DC

## 2019-06-22 MED ORDER — FENTANYL CITRATE (PF) 100 MCG/2ML IJ SOLN
50.0000 ug | Freq: Once | INTRAMUSCULAR | Status: AC
  Administered 2019-06-22: 01:00:00 50 ug via INTRAVENOUS

## 2019-06-22 MED ORDER — ENSURE ENLIVE PO LIQD
237.0000 mL | Freq: Three times a day (TID) | ORAL | Status: DC
  Administered 2019-06-22 – 2019-06-28 (×8): 237 mL via ORAL
  Filled 2019-06-22 (×19): qty 237

## 2019-06-22 MED ORDER — ASPIRIN EC 81 MG PO TBEC
81.0000 mg | DELAYED_RELEASE_TABLET | Freq: Every day | ORAL | Status: DC
  Administered 2019-06-22 – 2019-07-01 (×10): 81 mg via ORAL
  Filled 2019-06-22 (×10): qty 1

## 2019-06-22 MED ORDER — ACETAMINOPHEN 325 MG PO TABS
650.0000 mg | ORAL_TABLET | Freq: Four times a day (QID) | ORAL | Status: DC | PRN
  Administered 2019-06-28 – 2019-06-30 (×2): 650 mg via ORAL
  Filled 2019-06-22 (×2): qty 2

## 2019-06-22 MED ORDER — GUAIFENESIN-DM 100-10 MG/5ML PO SYRP
10.0000 mL | ORAL_SOLUTION | ORAL | Status: DC | PRN
  Administered 2019-06-22 – 2019-06-24 (×8): 10 mL via ORAL
  Filled 2019-06-22 (×8): qty 10

## 2019-06-22 MED ORDER — OXYCODONE HCL 5 MG PO TABS
5.0000 mg | ORAL_TABLET | Freq: Four times a day (QID) | ORAL | Status: DC | PRN
  Administered 2019-06-22 – 2019-06-29 (×23): 10 mg via ORAL
  Administered 2019-06-30: 5 mg via ORAL
  Administered 2019-06-30: 10 mg via ORAL
  Administered 2019-06-30: 5 mg via ORAL
  Administered 2019-06-30 – 2019-07-01 (×3): 10 mg via ORAL
  Filled 2019-06-22 (×14): qty 2
  Filled 2019-06-22: qty 1
  Filled 2019-06-22 (×2): qty 2
  Filled 2019-06-22: qty 1
  Filled 2019-06-22 (×11): qty 2

## 2019-06-22 MED ORDER — PANTOPRAZOLE SODIUM 40 MG PO TBEC
40.0000 mg | DELAYED_RELEASE_TABLET | Freq: Every day | ORAL | Status: DC
  Administered 2019-06-22 – 2019-07-01 (×10): 40 mg via ORAL
  Filled 2019-06-22 (×10): qty 1

## 2019-06-22 MED ORDER — SENNOSIDES-DOCUSATE SODIUM 8.6-50 MG PO TABS: 1.0000 | ORAL_TABLET | Freq: Every evening | ORAL | Status: DC | PRN

## 2019-06-22 MED ORDER — DEXAMETHASONE SODIUM PHOSPHATE 10 MG/ML IJ SOLN
6.0000 mg | INTRAMUSCULAR | Status: DC
  Administered 2019-06-22 – 2019-06-24 (×3): 6 mg via INTRAVENOUS
  Filled 2019-06-22 (×3): qty 1

## 2019-06-22 MED ORDER — ENSURE ENLIVE PO LIQD
237.0000 mL | Freq: Two times a day (BID) | ORAL | Status: DC
  Administered 2019-06-22 (×2): 237 mL via ORAL

## 2019-06-22 MED ORDER — OXYCODONE-ACETAMINOPHEN 5-325 MG PO TABS
1.0000 | ORAL_TABLET | Freq: Four times a day (QID) | ORAL | Status: DC | PRN
  Administered 2019-06-22 (×2): 1 via ORAL
  Filled 2019-06-22 (×2): qty 1

## 2019-06-22 MED ORDER — HYDROXYZINE PAMOATE 25 MG PO CAPS
25.0000 mg | ORAL_CAPSULE | Freq: Four times a day (QID) | ORAL | Status: DC | PRN
  Filled 2019-06-22: qty 1

## 2019-06-22 MED ORDER — ONDANSETRON HCL 4 MG/2ML IJ SOLN: 4.0000 mg | Freq: Four times a day (QID) | INTRAMUSCULAR | Status: DC | PRN

## 2019-06-22 MED ORDER — CLONAZEPAM 0.5 MG PO TABS
0.5000 mg | ORAL_TABLET | Freq: Two times a day (BID) | ORAL | Status: DC | PRN
  Administered 2019-06-22 – 2019-06-23 (×3): 0.5 mg via ORAL
  Filled 2019-06-22 (×3): qty 1

## 2019-06-22 MED ORDER — SODIUM CHLORIDE 0.9 % IV SOLN
100.0000 mg | INTRAVENOUS | Status: AC
  Administered 2019-06-22 – 2019-06-25 (×4): 100 mg via INTRAVENOUS
  Filled 2019-06-22 (×4): qty 20

## 2019-06-22 MED ORDER — SODIUM CHLORIDE 0.9% IV SOLUTION
Freq: Once | INTRAVENOUS | Status: AC
  Administered 2019-06-22: 17:00:00 via INTRAVENOUS

## 2019-06-22 MED ORDER — PREGABALIN 50 MG PO CAPS
75.0000 mg | ORAL_CAPSULE | Freq: Three times a day (TID) | ORAL | Status: DC
  Administered 2019-06-22 – 2019-07-01 (×28): 75 mg via ORAL
  Filled 2019-06-22 (×3): qty 1
  Filled 2019-06-22: qty 3
  Filled 2019-06-22 (×2): qty 1
  Filled 2019-06-22: qty 3
  Filled 2019-06-22 (×6): qty 1
  Filled 2019-06-22: qty 3
  Filled 2019-06-22 (×8): qty 1
  Filled 2019-06-22 (×3): qty 3
  Filled 2019-06-22: qty 1
  Filled 2019-06-22: qty 3
  Filled 2019-06-22 (×2): qty 1

## 2019-06-22 MED ORDER — IPRATROPIUM-ALBUTEROL 20-100 MCG/ACT IN AERS
1.0000 | INHALATION_SPRAY | Freq: Four times a day (QID) | RESPIRATORY_TRACT | Status: DC
  Administered 2019-06-22 – 2019-06-27 (×21): 1 via RESPIRATORY_TRACT
  Filled 2019-06-22: qty 4

## 2019-06-22 MED ORDER — SUCRALFATE 1 GM/10ML PO SUSP
1.0000 g | Freq: Three times a day (TID) | ORAL | Status: DC
  Administered 2019-06-22 – 2019-06-30 (×26): 1 g via ORAL
  Filled 2019-06-22 (×37): qty 10

## 2019-06-22 MED ORDER — ONDANSETRON HCL 4 MG PO TABS: 4.0000 mg | ORAL_TABLET | Freq: Four times a day (QID) | ORAL | Status: DC | PRN

## 2019-06-22 NOTE — Telephone Encounter (Signed)
Unable to reach patient or her husband yesterday to discuss the COVID positive results/as ordered through GI for endoscopy.  Discussed with patient's primary care-sent over to the emergency room/currently admitted to hospital.  Stacy-FYI -patient was seen in the clinic on 8/26-when she complained of cough/shortness of breath slightly worse from her baseline.  My clinical suspicion was low for COVID-without any fevers.  COVID 19 test-previously in July was negative.   On the day patient had labs done/but had NO chemotherapy/infusions.  Thanks,GB

## 2019-06-22 NOTE — Progress Notes (Signed)
PROGRESS NOTE  Brief Narrative: Susan Houston is a 58 y.o. female with a history of chronic hypoxic respiratory failure, COPD, metastatic squamous cell lung CA 2019 s/p resection and recurrence 2020 undergoing chemotherapy and radiation, laryngeal CA s/p chemo and radiation 2009, depression, chronic pain who presented to the ED by EMS 9/2 with increasing shortness of breath and cough. She was scheduled for repeat EGD on 9/4 and preadmission covid screen was found to be positive 9/1. During a televisit with PCP 9/2, she reported increasing dyspnea, chills, and cough as well as needing to increase supplemental oxygen at home. PCP arranged EMS transport to ED where CXR demonstrated bilateral patchy opacities on chronic interstitial changes and scarring. She was requiring 6L O2. CRP elevated at 19 with normal procalcitonin. Steroids were started and the patient admitted this morning by Dr. Bridgett Larsson at [facility]. Remdesivir has also been started.  Subjective: The patient reports stable shortness of breath though she's not gotten up since being here. Oxygen has been weaned down to 3L O2 (above her baseline, especially at rest). Cough productive of reddish sputum is stable. No chest pain or leg swelling.   Objective: BP 111/62 (BP Location: Right Arm)   Pulse 76   Temp 98.6 F (37 C) (Oral)   Resp 19   Ht 5\' 2"  (1.575 m)   Wt 85.9 kg   SpO2 96%   BMI 34.62 kg/m   Gen: Chronically ill-appearing pleasant frail female in no distress Pulm: Bilateral nondependent crackles, no wheezes. Nonlabored at rest on 3LPM O2.  CV: RRR, no murmur, no JVD, no edema GI: Soft, NT, ND, +BS  Neuro: Alert and oriented. No focal deficits. Skin: No rashes, lesions or ulcers. Port site nontender.  Assessment & Plan: Principal Problem:   Pneumonia due to COVID-19 virus Active Problems:   Chronic anxiety   COPD (chronic obstructive pulmonary disease) ([place])   Primary cancer of right lower lobe of lung (HCC) - metastatic squamous  cell   Chronic pain syndrome   Long term current use of opiate analgesic   MDD (major depressive disorder), recurrent episode, moderate (HCC)   GAD (generalized anxiety disorder)   Acute on chronic respiratory failure with hypoxia (HCC) - on 2 L/min at night   Anemia of chronic disease  Acute on chronic hypoxemic respiratory failure due to covid-19 pneumonia: Felt to be at risk of progression to ARDS with recurrent lung cancer, COPD, frail functional status, undergoing chemotherapy.  - Agree with IV steroids x7-10 days - Continue remdesivir (9/3 - 9/7) - Continue airborne, contact precautions. PPE including surgical gown, gloves, cap, shoe covers, and CAPR used during this encounter in a negative pressure room.  - Check daily labs: CBC w/diff, CMP, d-dimer, ferritin, CRP. CRP grossly elevated.  - Enoxaparin prophylactic dose. - Avoid NSAIDs - Discussed use of incentive spirometry and proning though pain will limit this. - Use of tocilizumab not recommended in setting of chemotherapy.  - Goals of care were discussed. Prognosis is guarded.  - Will get PT/OT involved early to avoid deconditioning as much as possible.  RLL metastatic squamous cell lung CA: Had initial resection July 2019, recurrence 2020. - Undergoing chemotherapy and radiation, supervised by Dr. Rogue Bussing who is aware of admission.   COPD: Under the care of pulmonology, Dr. Mortimer Fries - Continue controller medications, combivent, prn albuterol MDI and steroids as above.   MDD, GAD:  - Continue SSRI, prn clonazepam  Chronic pain, malignancy-associated pain:  - Continue opioid prescribed PTA. No  evidence of sedation, will change percocet 5/325mg  to oxyIR 5-10mg  prn mod/severe pain and continue tylenol for alternative indications. - Continue lyrica  Chemotherapy-induced pancytopenia, symptomatic anemia, and anemia of chronic disease:  - T&S completed.  - Monitor CBC daily, transfuse PRBCs given symptomatic anemia.   Patrecia Pour, MD Pager [phone] 06/30/2019, 11:29 AM

## 2019-06-22 NOTE — Progress Notes (Signed)
0200: Patient arrived on floor on 4L [place], alert and oriented x4, vital signs stable. MD notified.  Patient's husband was called and updated after patient arrived on floor and was settled in the room. Both patient and husband were informed of visitor and belongings policies. Plan of care explained and all questions answered.

## 2019-06-22 NOTE — Progress Notes (Signed)
Husband Tharon Aquas updated on plan of care and patient status. All questions answered.

## 2019-06-22 NOTE — ED Notes (Signed)
Report given to Amorita at [place].

## 2019-06-22 NOTE — H&P (Signed)
History and Physical    Susan Houston:[id number] DOB: [birth year] DOA: 07/18/2019  PCP: Valerie Roys, DO   Patient coming from: [facility]  I have personally briefly reviewed patient's old medical records in [place]  CC: SOB HPI: 58 year old female with a history of COPD, lung cancer undergoing active chemotherapy, chronic respiratory failure on home oxygen at night, chronic pain syndrome on chronic narcotics, chronic anxiety, depression presents to [facility] as a transfer from [facility].  Patient was undergoing routine COVID-19 testing due to upcoming EGD on Friday, June 23, 2019.  Her COVID-19 test was positive.  Patient states that she has been short of breath for the last several days.  She started using her nocturnal oxygen during the day.  She normally uses 2 L at night.  She is increase this up to 4 L continuously.  Patient has had chills.  No documented fever.  She has had a productive cough.  She thought most of her symptoms related to her metastatic cancer.  Patient states that she is only been to the doctor and to church this past Sunday.  She is not been on the house since then.  she denies any contact with any COVID positive patients.  Patient underwent chemotherapy about 2 weeks ago.  She had shortness of breath that was worsening over the last several days.  She has been using her nocturnal oxygen during the day.  Patient activated EMS.  Reportedly patient's pulse oximetry was in the 70% range when she was evaluated by EMS.  She has been having fevers and chills.  Patient states that she has been on prednisone for the last week.  In the ER, patient was given 10 mg of IV Decadron.  Her chest x-ray demonstrated diffuse patchy airspace interstitial opacities of bilateral lower lobes.  Also extensive interstitial disease of the left lung.  Patient had been tested for coronavirus yesterday and had been notified of her positive test by her PCP.   CBC today demonstrated leukopenia with a white count of 2.2 with an absolute lymphocyte count of 100.  LFTs were normal including AST of 16 and ALT of 11.  Procalcitonin was normal at less than 0.01.  CRP is pending.  Ferritin was normal at 186.  [facility] was contacted for admission.     ED Course: Patient given IV Decadron.   Review of Systems:  Review of Systems  Constitutional: Positive for chills and malaise/fatigue. Negative for fever.  HENT: Negative.        Denies any loss of taste or smell.  Eyes: Negative.   Respiratory: Positive for cough, sputum production, shortness of breath and wheezing.   Cardiovascular: Negative.   Gastrointestinal: Positive for heartburn.  Genitourinary: Negative.   Musculoskeletal: Positive for back pain and joint pain.  Skin: Negative.   Neurological: Negative.   Endo/Heme/Allergies: Negative.   Psychiatric/Behavioral: Positive for depression. The patient is nervous/anxious.   All other systems reviewed and are negative.   Past Medical History:  Diagnosis Date  . Anemia    vitamin b12 deficiency. no longer taking supplements  . Anxiety   . Arthritis   . Asthma   . Back pain   . Bruises easily   . Chronic leg pain    BILATERAL  . COPD (chronic obstructive pulmonary disease) (HCC)    no inhalers for over 1 year  . Depression   . Difficulty sleeping   . Diverticulitis   .  Dyspnea    due to having part of lung removed in 2019  . Dysrhythmia 04/2018   brady episodes. cleared by dr. Clayborn Bigness  . Facet syndrome, lumbar 03/28/2015  . GERD (gastroesophageal reflux disease)   . Hemorrhoids   . History of kidney stones 04/2018   recently passed stone  . Hyperlipidemia   . Incontinence of urine   . Laryngeal cancer ([place]) 2009   LARYNX CANCER - CHEMO / RADIATION (NO SURGERY)   . Migraine   . Migraine without aura and without status migrainosus, not intractable 09/11/2016  . Mild cognitive impairment 11/23/2017  . MRSA infection  greater than 3 months ago    2008 right side of face  . Nerve damage    right leg. thinks this is from her TKR  . Nodule of lower lobe of right lung 04/22/2018  . Oxygen deficiency    2L/HS  . Primary cancer of right lower lobe of lung ([place]) 04/18/2018   Surgical resection of RLL  . Wears dentures    full upper and lower    Past Surgical History:  Procedure Laterality Date  . ABDOMINAL HYSTERECTOMY  1983  . BLADDER SUSPENSION  2014  . CATARACT EXTRACTION W/PHACO Left 05/11/2018   Procedure: CATARACT EXTRACTION PHACO AND INTRAOCULAR LENS PLACEMENT ([place]) LEFT;  Surgeon: Leandrew Koyanagi, MD;  Location: [facility];  Service: Ophthalmology;  Laterality: Left;  PREFERS EARLY  . CATARACT EXTRACTION W/PHACO Right 08/11/2018   Procedure: CATARACT EXTRACTION PHACO AND INTRAOCULAR LENS PLACEMENT ([facility]);  Surgeon: Leandrew Koyanagi, MD;  Location: [facility];  Service: Ophthalmology;  Laterality: Right;  CDE 5:02 Total Time 01:35 Fluid lot # [id number]  . CHOLECYSTECTOMY  1998  . COLONOSCOPY    . COLONOSCOPY WITH PROPOFOL N/A 04/28/2019   Procedure: COLONOSCOPY WITH PROPOFOL;  Surgeon: Jonathon Bellows, MD;  Location: [facility] ENDOSCOPY;  Service: Gastroenterology;  Laterality: N/A;  . ENDOBRONCHIAL ULTRASOUND N/A 03/20/2019   Procedure: ENDOBRONCHIAL ULTRASOUND;  Surgeon: Flora Lipps, MD;  Location: [facility];  Service: Cardiopulmonary;  Laterality: N/A;  . ESOPHAGOGASTRODUODENOSCOPY  2015  . ESOPHAGOGASTRODUODENOSCOPY (EGD) WITH PROPOFOL N/A 11/20/2016   Procedure: ESOPHAGOGASTRODUODENOSCOPY (EGD) WITH PROPOFOL;  Surgeon: Jonathon Bellows, MD;  Location: [facility] ENDOSCOPY;  Service: Endoscopy;  Laterality: N/A;  . ESOPHAGOGASTRODUODENOSCOPY (EGD) WITH PROPOFOL N/A 04/28/2019   Procedure: ESOPHAGOGASTRODUODENOSCOPY (EGD) WITH PROPOFOL;  Surgeon: Jonathon Bellows, MD;  Location: [facility] ENDOSCOPY;  Service: Gastroenterology;  Laterality: N/A;  Pt needs appointment after 9:30am  . EXCISION NEUROMA Bilateral 09/24/2014    Procedure: BILATERAL OPEN SAPHENOUS NEURECTOMIES AND OPEN LEFT PERIPATELLA OSETOPHYTECTOMY;  Surgeon: Mauri Pole, MD;  Location: [facility];  Service: Orthopedics;  Laterality: Bilateral;  . EYE SURGERY Left 05/11/2018   cataract extraction  . JOINT REPLACEMENT  2009/2010   BIL TOTAL KNEES  . JOINT REPLACEMENT  2020   left  . PORTACATH PLACEMENT N/A 04/05/2019   Procedure: INSERTION PORT-A-CATH;  Surgeon: Nestor Lewandowsky, MD;  Location: [facility];  Service: General;  Laterality: N/A;  . THORACOTOMY/LOBECTOMY Right 05/16/2018   Procedure: THORACOTOMY/POSSIBLE LOBECTOMY;  Surgeon: Nestor Lewandowsky, MD;  Location: [facility];  Service: Thoracic;  Laterality: Right;  . TONSILLECTOMY    . TOTAL KNEE REVISION Left 11/29/2018   Procedure: Left TOTAL KNEE REVISION;  Surgeon: Hessie Knows, MD;  Location: [facility];  Service: Orthopedics;  Laterality: Left;  Marland Kitchen VIDEO BRONCHOSCOPY N/A 05/16/2018   Procedure: PREOP  BRONCHOSCOPY;  Surgeon: Nestor Lewandowsky, MD;  Location: [facility];  Service: Thoracic;  Laterality: N/A;  reports that she quit smoking about 3 years ago. Her smoking use included cigarettes. She smoked 0.50 packs per day. She has never used smokeless tobacco. She reports that she does not drink alcohol or use drugs.  Allergies  Allergen Reactions  . Morphine And Related Itching    Can tolerate with benadryl  . Cymbalta [Duloxetine Hcl] Other (See Comments)    Pt states it keeps her awake  . Adhesive [Tape] Other (See Comments)    Plastic tape rips skin and bruises her. PLEASE USE PAPER TAPE  . Lexapro [Escitalopram Oxalate] Other (See Comments)    Keeps awake for days.     Family History  Problem Relation Age of Onset  . Asthma Mother   . Diabetes Mother   . Hyperlipidemia Mother   . Hypertension Mother   . Cirrhosis Mother        Non-alcoholic  . Arthritis Father   . Cancer Father        Bone  . Hyperlipidemia Father   . Hypertension Father   . Anxiety disorder Sister   .  Depression Sister   . Kidney Stones Son   . Stroke Maternal Grandmother   . Breast cancer Maternal Grandmother 39  . Cancer Maternal Grandfather        lung  . Pneumonia Paternal Grandmother   . Alzheimer's disease Paternal Grandfather   . Diabetes Sister   . Gout Sister   . Hypertension Sister   . Alcohol abuse Brother   . Heart disease Brother        massive MI    Prior to Admission medications   Medication Sig Start Date End Date Taking? Authorizing Provider  albuterol (PROVENTIL HFA;VENTOLIN HFA) 108 (90 Base) MCG/ACT inhaler Inhale 2 puffs into the lungs every 4 (four) hours as needed for wheezing or shortness of breath. 07/01/18   Flora Lipps, MD  albuterol (PROVENTIL) (2.5 MG/3ML) 0.083% nebulizer solution Take 3 mLs (2.5 mg total) by nebulization every 4 (four) hours as needed for wheezing or shortness of breath. 01/20/19   Flora Lipps, MD  aspirin EC 81 MG tablet Take 81 mg by mouth daily.    [provider]  aspirin-acetaminophen-caffeine (EXCEDRIN MIGRAINE) [phone] MG tablet Take 1 tablet by mouth daily as needed for headache.    [provider]  chlorpheniramine-HYDROcodone (TUSSIONEX) 10-8 MG/5ML SUER Take 5 mLs by mouth every 12 (twelve) hours as needed for cough. 06/14/19   Cammie Sickle, MD  clonazePAM (KLONOPIN) 0.5 MG tablet TAKE 1/2 TO 1 TABLET BY MOUTH DAILY AS NEEDED FOR ANXIETY. TRY TO LIMIT USE AND USE ONLY FOR SEVERE ANXIETY AND SLEEP PROBLEMS 05/22/19   Ursula Alert, MD  estradiol (ESTRACE) 1 MG tablet Take 1 mg by mouth daily.  01/17/19   [provider]  FLUoxetine (PROZAC) 40 MG capsule Take 2 capsules (80 mg total) by mouth daily. 05/30/19   Ursula Alert, MD  hydrOXYzine (VISTARIL) 25 MG capsule TAKE 1 CAPSULE(25 MG) BY MOUTH TWICE DAILY AS NEEDED FOR SEVERE ANXIETY OR SYMPTOMS 06/13/19   Ursula Alert, MD  lidocaine-prilocaine (EMLA) cream Apply to affected area once 04/10/19   Cammie Sickle, MD  loperamide  (IMODIUM) 2 MG capsule Take 2 mg by mouth as needed for diarrhea or loose stools.    [provider]  magic mouthwash SOLN Take 5 mLs by mouth 3 (three) times daily as needed for mouth pain. 04/25/19   Cammie Sickle, MD  nystatin cream (MYCOSTATIN) Apply 1  application topically 2 (two) times daily. 02/23/19   Johnson, Megan P, DO  omeprazole (PRILOSEC) 40 MG capsule Take 1 capsule (40 mg total) by mouth daily. Patient taking differently: Take 40 mg by mouth every morning.  01/16/19   Jonathon Bellows, MD  ondansetron (ZOFRAN) 8 MG tablet Take 1 tablet (8 mg total) by mouth 2 (two) times daily as needed for refractory nausea / vomiting. Start on day 3 after chemo. 04/10/19   Cammie Sickle, MD  oxyCODONE-acetaminophen (PERCOCET) 5-325 MG tablet Take 1 tablet by mouth every 8 (eight) hours as needed for severe pain. 05/17/19 05/16/20  Cammie Sickle, MD  OXYGEN Inhale 2 L into the lungs at bedtime.    [provider]  predniSONE (DELTASONE) 20 MG tablet Take 3 pills once a day with food x 1 week; and then 1 pill a day. 06/14/19   Cammie Sickle, MD  pregabalin (LYRICA) 75 MG capsule Take 1 capsule (75 mg total) by mouth 3 (three) times daily. 06/05/19 12/02/19  Milinda Pointer, MD  prochlorperazine (COMPAZINE) 10 MG tablet Take 1 tablet (10 mg total) by mouth every 6 (six) hours as needed (Nausea or vomiting). 04/10/19   Cammie Sickle, MD  sucralfate (CARAFATE) 1 g tablet Take 1 tablet (1 g total) by mouth 3 (three) times daily. Dissolve in 3-4 tbsp warm water, swish and swallow. 04/18/19   Noreene Filbert, MD  Suvorexant (BELSOMRA) 20 MG TABS Take 20 mg by mouth at bedtime. 05/18/19   Ursula Alert, MD  traMADol (ULTRAM) 50 MG tablet Take 1 tablet (50 mg total) by mouth every 8 (eight) hours as needed. 05/31/19   Cammie Sickle, MD    Physical Exam: Vitals:   06/28/2019 0200  BP: 116/73  Pulse: 93  Resp: 17  Temp: 98.8 F (37.1 C)  TempSrc: Oral   SpO2: 94%    Physical Exam  Nursing note and vitals reviewed. Constitutional: She is oriented to person, place, and time. No distress. She is not intubated.  HENT:  Head: Normocephalic and atraumatic.  Nose: Nose normal.  Eyes: Right eye exhibits no discharge. Left eye exhibits no discharge. No scleral icterus.  Neck: No JVD present.  Cardiovascular: Normal rate and regular rhythm.  Respiratory: No accessory muscle usage. No apnea and no tachypnea. She is not intubated. No respiratory distress. She has rhonchi in the left middle field and the left lower field.  GI: Soft. Bowel sounds are normal. She exhibits no distension. There is no abdominal tenderness. There is no rebound.  Musculoskeletal:        General: No edema.  Neurological: She is alert and oriented to person, place, and time.  Skin: Skin is warm and dry. She is not diaphoretic.     Labs on Admission: I have personally reviewed following labs and imaging studies  COVID-19 Labs  Recent Labs    06/21/19 1611 06/21/19 1914  FERRITIN 186  --   CRP  --  19.4*    Lab Results  Component Value Date   SARSCOV2NAA POSITIVE (A) 06/20/2019   [place] NEGATIVE 04/25/2019   [place] NOT DETECTED 03/31/2019   SARSCOV2NAA NOT DETECTED 03/16/2019    CBC: Recent Labs  Lab 06/21/19 1611  WBC 2.2*  NEUTROABS 2.0  HGB 7.6*  HCT 21.8*  MCV 92.8  PLT 91*   Basic Metabolic Panel: Recent Labs  Lab 06/21/19 1611  NA 138  K 3.4*  CL 100  CO2 28  GLUCOSE 223*  BUN  10  CREATININE 0.68  CALCIUM 8.6*   GFR: Estimated Creatinine Clearance: 76.3 mL/min (by C-G formula based on SCr of 0.68 mg/dL). Liver Function Tests: Recent Labs  Lab 06/21/19 1611  AST 16  ALT 11  ALKPHOS 80  BILITOT 0.6  PROT 6.6  ALBUMIN 2.7*   No results for input(s): LIPASE, AMYLASE in the last 168 hours. No results for input(s): AMMONIA in the last 168 hours. Coagulation Profile: No results for input(s): INR, PROTIME in the last  168 hours. Cardiac Enzymes: No results for input(s): CKTOTAL, CKMB, CKMBINDEX, TROPONINI in the last 168 hours. BNP (last 3 results) No results for input(s): PROBNP in the last 8760 hours. HbA1C: No results for input(s): HGBA1C in the last 72 hours. CBG: No results for input(s): GLUCAP in the last 168 hours. Lipid Profile: No results for input(s): CHOL, HDL, LDLCALC, TRIG, CHOLHDL, LDLDIRECT in the last 72 hours. Thyroid Function Tests: No results for input(s): TSH, T4TOTAL, FREET4, T3FREE, THYROIDAB in the last 72 hours. Anemia Panel: Recent Labs    06/21/19 1611  FERRITIN 186   Urine analysis:    Component Value Date/Time   COLORURINE YELLOW (A) 03/03/2019 1503   APPEARANCEUR Clear 03/09/2019 1344   LABSPEC 1.010 03/03/2019 1503   LABSPEC 1.012 10/19/2013 1810   PHURINE 7.0 03/03/2019 1503   GLUCOSEU Negative 03/09/2019 1344   GLUCOSEU Negative 10/19/2013 1810   HGBUR LARGE (A) 03/03/2019 1503   BILIRUBINUR Negative 03/09/2019 1344   BILIRUBINUR Negative 10/19/2013 1810   KETONESUR NEGATIVE 03/03/2019 1503   PROTEINUR Negative 03/09/2019 1344   PROTEINUR NEGATIVE 03/03/2019 1503   UROBILINOGEN 1.0 09/07/2014 1157   NITRITE Negative 03/09/2019 1344   NITRITE NEGATIVE 03/03/2019 1503   LEUKOCYTESUR Trace (A) 03/09/2019 1344   LEUKOCYTESUR MODERATE (A) 03/03/2019 1503   LEUKOCYTESUR 1+ 10/19/2013 1810    Radiological Exams on Admission: I have personally reviewed images Dg Chest Portable 1 View  Result Date: 06/21/2019 CLINICAL DATA:  Increased shortness of breath. Productive cough. EXAM: PORTABLE CHEST 1 VIEW COMPARISON:  June 14, 2019 FINDINGS: Injectable port in stable position. Cardiomediastinal silhouette is normal. Mediastinal contours appear intact. Stable postsurgical changes in the right hemithorax with hyperlucent appearance of the remaining lung. Interval development of patchy airspace and interstitial opacities throughout the left lung, in the lower portion  of the right lung. Osseous structures are without acute abnormality. Soft tissues are grossly normal. IMPRESSION: 1. Interval development of patchy airspace and interstitial opacities throughout the left lung, and lower portion of the right lung. 2. Stable postsurgical changes in the right hemithorax. Electronically Signed   By: Fidela Salisbury M.D.   On: 06/21/2019 17:07    Assessment/Plan Principal Problem:   Pneumonia due to COVID-19 virus Active Problems:   Primary cancer of right lower lobe of lung (HCC) - metastatic squamous cell   Acute on chronic respiratory failure with hypoxia (HCC) - on 2 L/min at night   COPD (chronic obstructive pulmonary disease) (HCC)   Chronic anxiety   Chronic pain syndrome   Long term current use of opiate analgesic   MDD (major depressive disorder), recurrent episode, moderate (HCC)   GAD (generalized anxiety disorder)   Anemia of chronic disease    Pneumonia due to COVID-19 virus Admit to progressive care bed. COVID precautions. Continue with IV decadron 6 mg daily. RT for assessment. Given worsening hypoxia, will start remdesivir.  Hold off on Actemra for now.  Primary cancer of right lower lobe of lung ([place]) - metastatic  squamous cell Patient underwent chemotherapy about 2 weeks ago.  Acute on chronic respiratory failure with hypoxia (HCC) - on 2 L/min at night Continue supplemental oxygen to keep O2 saturations greater than 90%.  COPD (chronic obstructive pulmonary disease) ([place]) Will have RT assess patient.  Continue Combivent inhaler.  Chronic anxiety Continue Prozac.  PRN hydroxyzine.  Chronic pain syndrome Continue Percocet  Long term current use of opiate analgesic Continue Percocet, Lyrica  MDD (major depressive disorder), recurrent episode, moderate (HCC) Continue prozac  GAD (generalized anxiety disorder) Continue klonopin  Anemia of chronic disease Anemia appears to be worsening. Type and screen in the AM.  Could be  related to recent chemotherapy.   DVT prophylaxis: Lovenox Code Status: Full Code Family Communication: NONE  Disposition Plan: DC to home when stable  Consults called: none  Admission status: Inpatient, progressive care bed   Kristopher Oppenheim, DO [facility] 07/17/2019, 2:39 AM

## 2019-06-22 NOTE — Progress Notes (Signed)
Pharmacy Brief Note   O:  ALT: 11 CXR: Interval development of patchy airspace and interstitial opacities throughout the left lung, and lower portion of the right lung. SpO2: Normally uses 2 L at night.  She is increase this up to 4 L continuously.   A/P:  Patient meets requirements for remdesivir therapy.  Will start  remdesivir 200 mg IV x 1  followed by 100 mg IV daily x 4 days.  Monitor ALT  Royetta Asal, PharmD, BCPS 07/18/2019 2:53 AM

## 2019-06-22 NOTE — Plan of Care (Signed)
  Problem: Education: Goal: Knowledge of General Education information will improve Description: Including pain rating scale, medication(s)/side effects and non-pharmacologic comfort measures 07/15/2019 [id number] by Rush Farmer, RN Outcome: Progressing 06/21/2019 6734 by Rush Farmer, RN Outcome: Progressing   Problem: Health Behavior/Discharge Planning: Goal: Ability to manage health-related needs will improve 07/17/2019 1937 by Rush Farmer, RN Outcome: Progressing 06/24/2019 9024 by Rush Farmer, RN Outcome: Progressing   Problem: Clinical Measurements: Goal: Ability to maintain clinical measurements within normal limits will improve 07/11/2019 0973 by Rush Farmer, RN Outcome: Progressing 07/15/2019 5329 by Rush Farmer, RN Outcome: Progressing Goal: Will remain free from infection 07/10/2019 9242 by Rush Farmer, RN Outcome: Progressing 06/24/2019 6834 by Rush Farmer, RN Outcome: Progressing Goal: Diagnostic test results will improve 07/14/2019 1962 by Rush Farmer, RN Outcome: Progressing 07/18/2019 2297 by Rush Farmer, RN Outcome: Progressing Goal: Respiratory complications will improve 06/25/2019 9892 by Rush Farmer, RN Outcome: Progressing 07/01/2019 1194 by Rush Farmer, RN Outcome: Progressing Goal: Cardiovascular complication will be avoided 07/13/2019 1740 by Rush Farmer, RN Outcome: Progressing 07/19/2019 8144 by Rush Farmer, RN Outcome: Progressing   Problem: Activity: Goal: Risk for activity intolerance will decrease 07/04/2019 8185 by Rush Farmer, RN Outcome: Progressing 06/28/2019 6314 by Rush Farmer, RN Outcome: Progressing   Problem: Nutrition: Goal: Adequate nutrition will be maintained 06/25/2019 9702 by Rush Farmer, RN Outcome: Progressing 06/29/2019 6378 by Rush Farmer, RN Outcome: Progressing   Problem: Coping: Goal: Level of anxiety will decrease 06/27/2019 5885 by Rush Farmer, RN Outcome: Progressing 07/06/2019 0277 by Rush Farmer, RN Outcome: Progressing   Problem: Elimination: Goal: Will not experience complications related to bowel motility 07/13/2019 4128 by Rush Farmer, RN Outcome: Progressing 06/21/2019 7867 by Rush Farmer, RN Outcome: Progressing Goal: Will not experience complications related to urinary retention 07/14/2019 6720 by Rush Farmer, RN Outcome: Progressing 07/11/2019 9470 by Rush Farmer, RN Outcome: Progressing   Problem: Pain Managment: Goal: General experience of comfort will improve 07/15/2019 9628 by Rush Farmer, RN Outcome: Progressing 07/13/2019 3662 by Rush Farmer, RN Outcome: Progressing   Problem: Safety: Goal: Ability to remain free from injury will improve 07/18/2019 9476 by Rush Farmer, RN Outcome: Progressing 06/24/2019 5465 by Rush Farmer, RN Outcome: Progressing   Problem: Skin Integrity: Goal: Risk for impaired skin integrity will decrease 06/26/2019 0354 by Rush Farmer, RN Outcome: Progressing 07/05/2019 6568 by Rush Farmer, RN Outcome: Progressing   Problem: Education: Goal: Knowledge of risk factors and measures for prevention of condition will improve 06/27/2019 1275 by Rush Farmer, RN Outcome: Progressing 07/16/2019 1700 by Rush Farmer, RN Outcome: Progressing   Problem: Coping: Goal: Psychosocial and spiritual needs will be supported 06/21/2019 1749 by Rush Farmer, RN Outcome: Progressing 07/04/2019 4496 by Rush Farmer, RN Outcome: Progressing   Problem: Respiratory: Goal: Will maintain a patent airway 07/17/2019 7591 by Rush Farmer, RN Outcome: Progressing 06/20/2019 6384 by Rush Farmer, RN Outcome: Progressing Goal: Complications related to the disease process, condition or treatment will be avoided or minimized 07/17/2019 6659 by Rush Farmer, RN Outcome: Progressing 07/06/2019 9357 by Rush Farmer, RN Outcome: Progressing

## 2019-06-23 ENCOUNTER — Ambulatory Visit: Admission: RE | Admit: 2019-06-23 | Payer: Medicare Other | Source: Home / Self Care | Admitting: Gastroenterology

## 2019-06-23 ENCOUNTER — Encounter: Admission: RE | Payer: Self-pay | Source: Home / Self Care

## 2019-06-23 DIAGNOSIS — Z79891 Long term (current) use of opiate analgesic: Secondary | ICD-10-CM

## 2019-06-23 DIAGNOSIS — F331 Major depressive disorder, recurrent, moderate: Secondary | ICD-10-CM

## 2019-06-23 DIAGNOSIS — F411 Generalized anxiety disorder: Secondary | ICD-10-CM

## 2019-06-23 LAB — COMPREHENSIVE METABOLIC PANEL
ALT: 10 U/L (ref 0–44)
AST: 10 U/L — ABNORMAL LOW (ref 15–41)
Albumin: 2.7 g/dL — ABNORMAL LOW (ref 3.5–5.0)
Alkaline Phosphatase: 78 U/L (ref 38–126)
Anion gap: 8 (ref 5–15)
BUN: 15 mg/dL (ref 6–20)
CO2: 29 mmol/L (ref 22–32)
Calcium: 8.6 mg/dL — ABNORMAL LOW (ref 8.9–10.3)
Chloride: 103 mmol/L (ref 98–111)
Creatinine, Ser: 0.69 mg/dL (ref 0.44–1.00)
GFR calc Af Amer: >60 mL/min (ref >60)
GFR calc non Af Amer: >60 mL/min (ref >60)
Glucose, Bld: 131 mg/dL — ABNORMAL HIGH (ref 70–99)
Potassium: 4.2 mmol/L (ref 3.5–5.1)
Sodium: 140 mmol/L (ref 135–145)
Total Bilirubin: 0.5 mg/dL (ref 0.3–1.2)
Total Protein: 6.5 g/dL (ref 6.5–8.1)

## 2019-06-23 LAB — CBC WITH DIFFERENTIAL/PLATELET
Abs Immature Granulocytes: 0.03 10*3/uL (ref 0.00–0.07)
Basophils Absolute: 0 10*3/uL (ref 0.0–0.1)
Basophils Relative: 0 %
Eosinophils Absolute: 0 10*3/uL (ref 0.0–0.5)
Eosinophils Relative: 0 %
HCT: 25.8 % — ABNORMAL LOW (ref 36.0–46.0)
Hemoglobin: 8.4 g/dL — ABNORMAL LOW (ref 12.0–15.0)
Immature Granulocytes: 1 %
Lymphocytes Relative: 6 %
Lymphs Abs: 0.2 10*3/uL — ABNORMAL LOW (ref 0.7–4.0)
MCH: 31.1 pg (ref 26.0–34.0)
MCHC: 32.6 g/dL (ref 30.0–36.0)
MCV: 95.6 fL (ref 80.0–100.0)
Monocytes Absolute: 0.2 10*3/uL (ref 0.1–1.0)
Monocytes Relative: 6 %
Neutro Abs: 3.2 10*3/uL (ref 1.7–7.7)
Neutrophils Relative %: 87 %
Platelets: 80 10*3/uL — ABNORMAL LOW (ref 150–400)
RBC: 2.7 MIL/uL — ABNORMAL LOW (ref 3.87–5.11)
RDW: 19.9 % — ABNORMAL HIGH (ref 11.5–15.5)
WBC: 3.6 10*3/uL — ABNORMAL LOW (ref 4.0–10.5)
nRBC: 0 % (ref 0.0–0.2)

## 2019-06-23 LAB — TYPE AND SCREEN
ABO/RH(D): B NEG
Antibody Screen: NEGATIVE
Unit division: 0

## 2019-06-23 LAB — BPAM RBC
Blood Product Expiration Date: 202009272359
ISSUE DATE / TIME: 202009031628
Unit Type and Rh: 1700

## 2019-06-23 LAB — C-REACTIVE PROTEIN: CRP: 8.5 mg/dL — ABNORMAL HIGH (ref <1.0)

## 2019-06-23 SURGERY — ESOPHAGOGASTRODUODENOSCOPY (EGD) WITH PROPOFOL
Anesthesia: General

## 2019-06-23 MED ORDER — CLONAZEPAM 1 MG PO TABS
1.0000 mg | ORAL_TABLET | Freq: Two times a day (BID) | ORAL | Status: DC | PRN
  Administered 2019-06-23: 1 mg via ORAL
  Filled 2019-06-23: qty 1

## 2019-06-23 NOTE — TOC Initial Note (Signed)
Transition of Care [facility]) - Initial/Assessment Note    Patient Details  Name: Susan Houston MRN: [id number] Date of Birth: 26-Jun-1961  Transition of Care [facility]) CM/SW Contact:    Ninfa Meeker, RN Phone Number:  [phone] (working remotely) 06/23/2019, 12:46 PM  Clinical Narrative:    58 yr old female admitted from home with COVID 19. has a history of chronic hypoxic respiratory failure, COPD, metastatic squamous cell lung CA 2019 s/p resection and recurrence 2020 undergoing chemotherapy and radiation. Case manager will continue to monitor for discharge needs as she medically improves, may God bless her to do so.       Patient Goals and CMS Choice        Expected Discharge Plan and Services                                                Prior Living Arrangements/Services                       Activities of Daily Living Home Assistive Devices/Equipment: Grab bars around toilet, Grab bars in shower, Oxygen, Raised toilet seat with rails, Shower chair with back ADL Screening (condition at time of admission) Patient's cognitive ability adequate to safely complete daily activities?: Yes Is the patient deaf or have difficulty hearing?: No Does the patient have difficulty seeing, even when wearing glasses/contacts?: No Does the patient have difficulty concentrating, remembering, or making decisions?: No Patient able to express need for assistance with ADLs?: Yes Does the patient have difficulty dressing or bathing?: No Independently performs ADLs?: Yes (appropriate for developmental age) Does the patient have difficulty walking or climbing stairs?: No Weakness of Legs: None Weakness of Arms/Hands: None  Permission Sought/Granted                  Emotional Assessment              Admission diagnosis:  Covid Positive Patient Active Problem List   Diagnosis Date Noted  . COVID-19 virus detected 06/21/2019  . Pneumonia due to COVID-19 virus  06/21/2019  . Acute on chronic respiratory failure with hypoxia (HCC) - on 2 L/min at night 06/21/2019  . Anemia of chronic disease 06/21/2019  . Goals of care, counseling/discussion 04/24/2019  . MDD (major depressive disorder), recurrent episode, moderate ([place]) 04/05/2019  . GAD (generalized anxiety disorder) 04/05/2019  . Adenopathy   . Fibromyalgia syndrome 01/25/2019  . S/P revision of total knee, left 11/29/2018  . Costochondritis (Secondary area of Pain) 10/27/2018  . Primary insomnia 09/16/2018  . Chronic chest wall pain following surgery (Right) 09/08/2018  . Intercostal pain (Primary Area of Pain) (Right) 09/08/2018  . Intercostal neuralgia 09/08/2018  . 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 Chronic post-thoracotomy pain (Primary Area of Pain) (Right) 08/29/2018  . Neurogenic pain 08/29/2018  . Vitamin D insufficiency 08/29/2018  . Rib pain (Primary Area of Pain) (Right) 08/18/2018  . Incisional pain (Primary Area of Pain) 08/18/2018  . Chronic pain syndrome 08/18/2018  . Long term current use of opiate analgesic 08/18/2018  . Pharmacologic therapy 08/18/2018  . Disorder of skeletal system 08/18/2018  . Problems influencing health status 08/18/2018  . Controlled substance agreement signed 06/28/2018  . Primary cancer of right lower lobe of lung ([place]) - metastatic squamous cell 05/27/2018  . Lung cancer ([place]) 05/16/2018  .  Laryngeal cancer ([place]) 04/22/2018  . Mass of lower lobe of right lung 04/22/2018  . Vitamin B12 deficiency 12/15/2017  . Frequent falls 11/23/2017  . MCI (mild cognitive impairment) 11/23/2017  . Migraine without aura and without status migrainosus, not intractable 09/11/2016  . Hyperlipidemia   . COPD (chronic obstructive pulmonary disease) ([place])   . Severe depression ([place]) 12/24/2015  . Chronic anxiety 12/24/2015  . Coarse tremors 12/24/2015  . Lumbar facet syndrome 03/28/2015  . DDD (degenerative disc disease), lumbar 03/05/2015  . DDD  (degenerative disc disease), lumbosacral 02/23/2015  . Sacroiliac joint dysfunction 02/23/2015  . Saphenous neuralgia 09/24/2014   PCP:  Valerie Roys, DO Pharmacy:   [facility] [place], [place] AT [facility] OF [address] [place] [address] Phone: [phone] Fax: [phone]     Social Determinants of Health (SDOH) Interventions    Readmission Risk Interventions No flowsheet data found.

## 2019-06-23 NOTE — Plan of Care (Signed)

## 2019-06-23 NOTE — Evaluation (Signed)
Physical Therapy Evaluation Patient Details Name: Susan Houston MRN: [id number] DOB: 09/09/1961 Today's Date: 06/23/2019   History of Present Illness  58 y.o. female admitted on 07/08/2019 for SOB.  Found to have COVID-19 PNA.  Pt currently undergoing tx for R LL metastatic squamous cell lung CA (s/p resection july 2019), recurrence in 2020 actively getting chemo.  Pt wiht other significant PMH of COPD, chronic pain, laryngeal CA, lumbar facet syndrome, anemia, migraine, uses 2 L O2 [name] at baseline, but recently (for the past 4 weeks has had to use 4 L O2 [name]), bil TKA.    Clinical Impression  Pt dyspnic with very little activity.  She was on 6L O2 [name] and was only able to stand EOB, transfer to [name] and back to EOB.  She gets anxious and feels like she cannot "catch my breath".  O2 sats dipped once to 87% on 6L and took ~5 mins to rebound to 90%.  DOE 3/4.  O2 measured by nellcor finger probe.  I encouraged pt to eat her meals up in the chair and got her a recliner chair since her room did not have one.  PT will continue to follow acutely for safe mobility progression  Follow Up Recommendations Home health PT;Supervision for mobility/OOB    Equipment Recommendations  None recommended by PT    Recommendations for Other Services   NA    Precautions / Restrictions Precautions Precautions: Other (comment) Precaution Comments: monitor sats      Mobility  Bed Mobility Overal bed mobility: Modified Independent             General bed mobility comments: HOB elevated light use of rail.   Transfers Overall transfer level: Needs assistance Equipment used: Rolling walker (2 wheeled);None Transfers: Sit to/from [facility] Sit to Stand: Supervision Stand pivot transfers: Supervision       General transfer comment: supervision for safety.  Used RW for energy conservation, but did not need it to transfer, pt not ready to try walking yet. She gets anxious.       Balance Overall  balance assessment: Mild deficits observed, not formally tested                                           Pertinent Vitals/Pain Pain Assessment: No/denies pain    Home Living Family/patient expects to be discharged to:: Private residence Living Arrangements: Spouse/significant other Available Help at Discharge: Family;Available 24 hours/day(husband works but can take off if needed) Type of Home: House Home Access: Stairs to enter Entrance Stairs-Rails: Can reach both Entrance Stairs-Number of Steps: 5 Home Layout: One level Home Equipment: [place] - 2 wheels;Cane - quad;Bedside commode;Shower seat;Grab bars - tub/shower      Prior Function Level of Independence: Needs assistance   Gait / Transfers Assistance Needed: gait with no AD, still drives, does not work she is on disability.   ADL's / Homemaking Assistance Needed: Pt reports she is independent with bathing and dressing but husband provides assistance for cooking and cleaning.        Hand Dominance   Dominant Hand: Right    Extremity/Trunk Assessment   Upper Extremity Assessment Upper Extremity Assessment: Generalized weakness    Lower Extremity Assessment Lower Extremity Assessment: Generalized weakness    Cervical / Trunk Assessment Cervical / Trunk Assessment: Other exceptions Cervical / Trunk Exceptions: h/o chronic low back  pain  Communication   Communication: No difficulties  Cognition Arousal/Alertness: Awake/alert Behavior During Therapy: WFL for tasks assessed/performed Overall Cognitive Status: Within Functional Limits for tasks assessed                                        General Comments General comments (skin integrity, edema, etc.): VSS on 6 L O2 [name].  She dipped to 87% once during mobility, but came back up with supine rest in ~ 51mins.  O2 measured by nellcor finger probe        Assessment/Plan    PT Assessment Patient needs continued PT services  PT  Problem List Decreased strength;Decreased activity tolerance;Decreased mobility;Decreased balance;Decreased knowledge of precautions;Cardiopulmonary status limiting activity       PT Treatment Interventions DME instruction;Stair training;Gait training;Functional mobility training;Therapeutic activities;Therapeutic exercise;Balance training;Cognitive remediation;Patient/family education    PT Goals (Current goals can be found in the Care Plan section)  Acute Rehab PT Goals Patient Stated Goal: to breathe easier PT Goal Formulation: With patient Time For Goal Achievement: 07/07/19 Potential to Achieve Goals: Good    Frequency Min 3X/week           AM-PAC PT "6 Clicks" Mobility  Outcome Measure Help needed turning from your back to your side while in a flat bed without using bedrails?: A Little Help needed moving from lying on your back to sitting on the side of a flat bed without using bedrails?: A Little Help needed moving to and from a bed to a chair (including a wheelchair)?: A Little Help needed standing up from a chair using your arms (e.g., wheelchair or bedside chair)?: A Little Help needed to walk in hospital room?: A Little Help needed climbing 3-5 steps with a railing? : A Little 6 Click Score: 18    End of Session Equipment Utilized During Treatment: Oxygen Activity Tolerance: Patient limited by fatigue Patient left: in bed;with call bell/phone within reach   PT Visit Diagnosis: Muscle weakness (generalized) (M62.81);Difficulty in walking, not elsewhere classified (R26.2)    Time: 8119-1478 PT Time Calculation (min) (ACUTE ONLY): 28 min   Charges:        Wells Guiles B. Gilliam Hawkes, PT, DPT  Acute Rehabilitation [phone] pager #[phone] office  @ [name]: [phone]   PT Evaluation $PT Eval Moderate Complexity: 1 Mod PT Treatments $Therapeutic Activity: 8-22 mins       06/23/2019, 5:18 PM

## 2019-06-23 NOTE — Progress Notes (Signed)
 Initial Nutrition Assessment   RD working remotely.   DOCUMENTATION CODES:   Obesity unspecified  INTERVENTION:   If pt with difficulty swallowing/chewing current diet consistency with hx of dysphagia with plan for EGD in future, recommend downgrading diet  Ensure Enlive po TID, each supplement provides 350 kcal and 20 grams of protein  Whole milk TID on meal trays  Pt receiving Hormel Shake daily with Breakfast which provides 520 kcals and 22 g of protein and Magic cup BID with lunch and dinner, each supplement provides 290 kcal and 9 grams of protein, automatically on meal trays to optimize nutritional intake.   Encourage po intake and oral nutrition supplements to optimize po intake  NUTRITION DIAGNOSIS:   Increased nutrient needs related to cancer and cancer related treatments, acute illness as evidenced by estimated needs.  GOAL:   Patient will meet greater than or equal to 90% of their needs  MONITOR:   Supplement acceptance, PO intake, Labs, Weight trends  REASON FOR ASSESSMENT:   Malnutrition Screening Tool    ASSESSMENT:   58 yo female admitted with acute on chronic respiratory failure due to COVID-19 pneumonia; high risk for progression to ARDS. PMH includes metastatic squamous cell lung cancer 2019 s/p resection and recurrence 2020 undergoing chemo and radiation; laryngeal cancer s/p chemo and radiation in 2009. Additional hx includes chronic respiratory failure, COPD, depression, chronic pain  Attempted multiple times to reach patient and RN via telephone. Unable to obtain diet and weight history at this time  Pt scheduled for EGD 9/04 due to dysphagia and biopsy positive for dysplastic squamous cell but unable to have due to COVID-19 test positive and admission to [facility]  Pt wears dentures (upper and lower). Spoke with outpatient oncology RD who indicates pt has been experiencing some dysphagia, certain foods getting stuck in chest.   Oncology RD has seen  patient several times since beginning of July. Appetite has not been great, pt has experienced some issues with N/V/D and able to drink some Ensure (not drinking daily)   Labs: reviewed Meds: reviewed  NUTRITION - FOCUSED PHYSICAL EXAM:  Unable to assess  Diet Order:   Diet Order            Diet regular Room service appropriate? Yes; Fluid consistency: Thin  Diet effective now              EDUCATION NEEDS:   Not appropriate for education at this time  Skin:  Skin Assessment: Skin Integrity Issues: Skin Integrity Issues:: Other (Comment) Other: MASD to abdomen and groin  Last BM:  9/4  Height:   Ht Readings from Last 1 Encounters:  07/03/2019 5\' 2"  (1.575 m)    Weight:   Wt Readings from Last 1 Encounters:  07/14/2019 85.9 kg    Ideal Body Weight:  50 kg  BMI:  Body mass index is 34.62 kg/m.  Estimated Nutritional Needs:   Kcal:  1900-2100 kcals  Protein:  95-110 g  Fluid:  >/= 1.9 L    Jandel Patriarca MS, RDN, LDN, CNSC [phone] Pager  [phone] Weekend/On-Call Pager

## 2019-06-23 NOTE — Progress Notes (Signed)
PROGRESS NOTE  Susan Houston  IRC:[id number] DOB: 11-03-1960 DOA: 06/29/2019 PCP: Valerie Roys, DO  Brief Narrative: Susan Houston is a 58 y.o. female with a history of chronic hypoxic respiratory failure, COPD, metastatic squamous cell lung CA 2019 s/p resection and recurrence 2020 undergoing chemotherapy and radiation, laryngeal CA s/p chemo and radiation 2009, depression, chronic pain who presented to the ED by EMS 9/2 with increasing shortness of breath and cough. She was scheduled for repeat EGD on 9/4 and preadmission covid screen was found to be positive 9/1.During a televisit with PCP 9/2, she reported increasing dyspnea, chills, and cough as well as needing to increase supplemental oxygen at home. PCP arranged EMS transport to ED where CXR demonstrated bilateral patchy opacities on chronic interstitial changes and scarring. She was requiring 6L O2. CRP elevated at 19 with normal procalcitonin. Steroids and remdesivir started and the patient was admitted to [facility]. Hemoglobin decreased in the setting of pancytopenia and 1u PRBCs given 9/3 with improvement.   Assessment & Plan: Principal Problem:   Pneumonia due to COVID-19 virus Active Problems:   Chronic anxiety   COPD (chronic obstructive pulmonary disease) ([place])   Primary cancer of right lower lobe of lung (HCC) - metastatic squamous cell   Chronic pain syndrome   Long term current use of opiate analgesic   MDD (major depressive disorder), recurrent episode, moderate (HCC)   GAD (generalized anxiety disorder)   Acute on chronic respiratory failure with hypoxia (HCC) - on 2 L/min at night   Anemia of chronic disease   Acute on chronic hypoxemic respiratory failure due to covid-19 pneumonia: Felt to be at risk of progression to ARDS with recurrent lung cancer, COPD, frail functional status, undergoing chemotherapy.  - Continue IV steroids x7-10 days - Continue remdesivir (9/3 - 9/7) - Continue airborne, contact precautions. PPE including  surgical gown, gloves, cap, shoe covers, and CAPR used during this encounter in a negative pressure room.  - Check daily labs: CBC w/diff, CMP, d-dimer, ferritin, CRP. CRP grossly elevated but improved today. - Enoxaparin prophylactic dose. - Avoid NSAIDs - Discussed use of incentive spirometry and proning though pain will limit this. - Use of tocilizumab not recommended in setting of chemotherapy.  - Goals of care were discussed with patient and husband. Prognosis is guarded.  - Will get PT/OT involved early to avoid deconditioning as much as possible. Ordered.  RLL metastatic squamous cell lung CA: Had initial resection July 2019, recurrence 2020. - Undergoing chemotherapy and radiation, supervised by Dr. Rogue Bussing who is aware of admission.   COPD: Under the care of pulmonology, Dr. Mortimer Fries - Continue controller medications, combivent, prn albuterol MDI and steroids as above.   MDD, GAD:  - Continue SSRI, prn clonazepam  Chronic pain, malignancy-associated pain:  - No evidence of sedation, changed percocet 5/325mg  (PTA med) to oxyIR 5-10mg  prn mod/severe pain and continue tylenol for alternative indications. - Continue lyrica  Chemotherapy-induced pancytopenia, symptomatic anemia, and anemia of chronic disease: s/p 1u PRBCs 9/3 with improvement as expected.  - Continue to monitor CBC daily or prn bleeding. As there is no active bleeding or hemodynamic instability to suggest occult bleeding, the benefit of prophylactic-dose lovenox is felt to outweigh risk currently.  Obesity: - Dietitian consulted  DVT prophylaxis: Lovenox 40mg  q24h Code Status: Full Family Communication: Husband by phone Disposition Plan: Uncertain. PT/OT consulted.  Consultants:   None  Procedures:   None  Antimicrobials:  Remdesivir   Subjective: Susan Houston a bit better  from yesterday, but not getting up much. No chest pain or leg swelling or orthopnea reported. Still short of breath with bed-level  mobility despite supplemental oxygen. Denies bleeding.  Objective: Vitals:   06/23/19 0039 06/23/19 0410 06/23/19 0804 06/23/19 0900  BP:  (!) 141/68 99/69   Pulse:  65 64   Resp:  14 (!) 24 16  Temp:  97.9 F (36.6 C) (!) 97.3 F (36.3 C)   TempSrc:  Oral Oral   SpO2: 94% 93% 91%   Weight:      Height:        Intake/Output Summary (Last 24 hours) at 06/23/2019 1110 Last data filed at 06/23/2019 0300 Gross per 24 hour  Intake 350 ml  Output -  Net 350 ml   Filed Weights   07/05/2019 0501  Weight: 85.9 kg   Gen: 58 y.o. female in no distress, chronically ill-appearing. Pulm: Non-labored breathing supplemental oxygen. Bilateral crackles.  CV: Regular rate and rhythm. No murmur, rub, or gallop. No JVD, no pedal edema. GI: Abdomen soft, non-tender, non-distended, with normoactive bowel sounds. No organomegaly or masses felt. Ext: Warm, no deformities Skin: No rashes, lesions or ulcers, scattered ecchymoses are stable, mild. Neuro: Alert and oriented. No focal neurological deficits. Psych: Judgement and insight appear normal. Mood & affect appropriate.   Data Reviewed: I have personally reviewed following labs and imaging studies  CBC: Recent Labs  Lab 06/21/19 1611 06/20/2019 0430 06/23/19 0000  WBC 2.2* 4.0 3.6*  NEUTROABS 2.0 3.5 3.2  HGB 7.6* 7.2* 8.4*  HCT 21.8* 22.6* 25.8*  MCV 92.8 94.2 95.6  PLT 91* 87* 80*   Basic Metabolic Panel: Recent Labs  Lab 06/21/19 1611 06/21/2019 0430 06/23/19 0000  NA 138 139 140  K 3.4* 3.6 4.2  CL 100 100 103  CO2 28 29 29   GLUCOSE 223* 146* 131*  BUN 10 14 15   CREATININE 0.68 0.62 0.69  CALCIUM 8.6* 8.8* 8.6*   GFR: Estimated Creatinine Clearance: 77 mL/min (by C-G formula based on SCr of 0.69 mg/dL). Liver Function Tests: Recent Labs  Lab 06/21/19 1611 06/27/2019 0430 06/23/19 0000  AST 16 12* 10*  ALT 11 10 10   ALKPHOS 80 76 78  BILITOT 0.6 0.5 0.5  PROT 6.6 6.6 6.5  ALBUMIN 2.7* 2.7* 2.7*   No results for  input(s): LIPASE, AMYLASE in the last 168 hours. No results for input(s): AMMONIA in the last 168 hours. Coagulation Profile: No results for input(s): INR, PROTIME in the last 168 hours. Cardiac Enzymes: No results for input(s): CKTOTAL, CKMB, CKMBINDEX, TROPONINI in the last 168 hours. BNP (last 3 results) No results for input(s): PROBNP in the last 8760 hours. HbA1C: No results for input(s): HGBA1C in the last 72 hours. CBG: No results for input(s): GLUCAP in the last 168 hours. Lipid Profile: No results for input(s): CHOL, HDL, LDLCALC, TRIG, CHOLHDL, LDLDIRECT in the last 72 hours. Thyroid Function Tests: No results for input(s): TSH, T4TOTAL, FREET4, T3FREE, THYROIDAB in the last 72 hours. Anemia Panel: Recent Labs    06/21/19 1611 07/11/2019 0430  FERRITIN 186 142   Urine analysis:    Component Value Date/Time   COLORURINE YELLOW (A) 03/03/2019 1503   APPEARANCEUR Clear 03/09/2019 1344   LABSPEC 1.010 03/03/2019 1503   LABSPEC 1.012 10/19/2013 1810   PHURINE 7.0 03/03/2019 1503   GLUCOSEU Negative 03/09/2019 1344   GLUCOSEU Negative 10/19/2013 1810   HGBUR LARGE (A) 03/03/2019 1503   BILIRUBINUR Negative 03/09/2019 1344   BILIRUBINUR  Negative 10/19/2013 1810   KETONESUR NEGATIVE 03/03/2019 1503   PROTEINUR Negative 03/09/2019 1344   PROTEINUR NEGATIVE 03/03/2019 1503   UROBILINOGEN 1.0 09/07/2014 1157   NITRITE Negative 03/09/2019 1344   NITRITE NEGATIVE 03/03/2019 1503   LEUKOCYTESUR Trace (A) 03/09/2019 1344   LEUKOCYTESUR MODERATE (A) 03/03/2019 1503   LEUKOCYTESUR 1+ 10/19/2013 1810   Recent Results (from the past 240 hour(s))  SARS CORONAVIRUS 2 (TAT 6-24 HRS) Nasopharyngeal Nasopharyngeal Swab     Status: Abnormal   Collection Time: 06/20/19 11:37 AM   Specimen: Nasopharyngeal Swab  Result Value Ref Range Status   SARS Coronavirus 2 POSITIVE (A) NEGATIVE Final    Comment: (NOTE) SARS-CoV-2 target nucleic acids are DETECTED. The SARS-CoV-2 RNA is  generally detectable in upper and lower respiratory specimens during the acute phase of infection. Positive results are indicative of active infection with SARS-CoV-2. Clinical  correlation with patient history and other diagnostic information is necessary to determine patient infection status. Positive results do  not rule out bacterial infection or co-infection with other viruses. The expected result is Negative. Fact Sheet for Patients: SugarRoll.be Fact Sheet for Healthcare Providers: https://www.woods-mathews.com/ This test is not yet approved or cleared by the [place] FDA and  has been authorized for detection and/or diagnosis of SARS-CoV-2 by FDA under an Emergency Use Authorization (EUA). This EUA will remain  in effect (meaning this test can be used) for the duration of the COVID-19 declaration under Section 564(b)(1) of the Act, 21 U.S.C.  section 360bbb-3(b)(1), unless the authorization is terminated or revoked sooner. Performed at [facility], [place] [address], [place], [place] [address]       Radiology Studies: Dg Chest Portable 1 View  Result Date: 06/21/2019 CLINICAL DATA:  Increased shortness of breath. Productive cough. EXAM: PORTABLE CHEST 1 VIEW COMPARISON:  June 14, 2019 FINDINGS: Injectable port in stable position. Cardiomediastinal silhouette is normal. Mediastinal contours appear intact. Stable postsurgical changes in the right hemithorax with hyperlucent appearance of the remaining lung. Interval development of patchy airspace and interstitial opacities throughout the left lung, in the lower portion of the right lung. Osseous structures are without acute abnormality. Soft tissues are grossly normal. IMPRESSION: 1. Interval development of patchy airspace and interstitial opacities throughout the left lung, and lower portion of the right lung. 2. Stable postsurgical changes in the right hemithorax. Electronically  Signed   By: Fidela Salisbury M.D.   On: 06/21/2019 17:07    Scheduled Meds: . aspirin EC  81 mg Oral Daily  . dexamethasone (DECADRON) injection  6 mg Intravenous Q24H  . enoxaparin (LOVENOX) injection  40 mg Subcutaneous Q24H  . feeding supplement (ENSURE ENLIVE)  237 mL Oral TID BM  . FLUoxetine  80 mg Oral Daily  . Ipratropium-Albuterol  1 puff Inhalation Q6H  . pantoprazole  40 mg Oral Daily  . pregabalin  75 mg Oral TID  . sucralfate  1 g Oral TID  . Suvorexant  20 mg Oral QHS   Continuous Infusions: . remdesivir 100 mg in NS 250 mL 100 mg (07/01/2019 2116)     LOS: 1 day   Time spent: 35 minutes.  Patrecia Pour, MD [facility] [email or web address] Password [email or web address] 06/23/2019, 11:10 AM

## 2019-06-24 ENCOUNTER — Inpatient Hospital Stay (HOSPITAL_COMMUNITY): Payer: Medicare Other

## 2019-06-24 DIAGNOSIS — J1289 Other viral pneumonia: Secondary | ICD-10-CM

## 2019-06-24 DIAGNOSIS — U071 COVID-19: Principal | ICD-10-CM

## 2019-06-24 LAB — CBC WITH DIFFERENTIAL/PLATELET
Abs Immature Granulocytes: 0.07 10*3/uL (ref 0.00–0.07)
Basophils Absolute: 0 10*3/uL (ref 0.0–0.1)
Basophils Relative: 0 %
Eosinophils Absolute: 0 10*3/uL (ref 0.0–0.5)
Eosinophils Relative: 0 %
HCT: 28.8 % — ABNORMAL LOW (ref 36.0–46.0)
Hemoglobin: 9 g/dL — ABNORMAL LOW (ref 12.0–15.0)
Immature Granulocytes: 2 %
Lymphocytes Relative: 7 %
Lymphs Abs: 0.3 10*3/uL — ABNORMAL LOW (ref 0.7–4.0)
MCH: 30 pg (ref 26.0–34.0)
MCHC: 31.3 g/dL (ref 30.0–36.0)
MCV: 96 fL (ref 80.0–100.0)
Monocytes Absolute: 0.4 10*3/uL (ref 0.1–1.0)
Monocytes Relative: 11 %
Neutro Abs: 3.1 10*3/uL (ref 1.7–7.7)
Neutrophils Relative %: 80 %
Platelets: 92 10*3/uL — ABNORMAL LOW (ref 150–400)
RBC: 3 MIL/uL — ABNORMAL LOW (ref 3.87–5.11)
RDW: 20.2 % — ABNORMAL HIGH (ref 11.5–15.5)
WBC: 3.9 10*3/uL — ABNORMAL LOW (ref 4.0–10.5)
nRBC: 0 % (ref 0.0–0.2)

## 2019-06-24 LAB — POCT I-STAT 7, (LYTES, BLD GAS, ICA,H+H)
Acid-Base Excess: 9 mmol/L — ABNORMAL HIGH (ref 0.0–2.0)
Bicarbonate: 33.5 mmol/L — ABNORMAL HIGH (ref 20.0–28.0)
Calcium, Ion: 1.2 mmol/L (ref 1.15–1.40)
HCT: 29 % — ABNORMAL LOW (ref 36.0–46.0)
Hemoglobin: 9.9 g/dL — ABNORMAL LOW (ref 12.0–15.0)
O2 Saturation: 99 %
Patient temperature: 97.5
Potassium: 4 mmol/L (ref 3.5–5.1)
Sodium: 136 mmol/L (ref 135–145)
TCO2: 35 mmol/L — ABNORMAL HIGH (ref 22–32)
pCO2 arterial: 42.8 mmHg (ref 32.0–48.0)
pH, Arterial: 7.498 — ABNORMAL HIGH (ref 7.350–7.450)
pO2, Arterial: 143 mmHg — ABNORMAL HIGH (ref 83.0–108.0)

## 2019-06-24 LAB — COMPREHENSIVE METABOLIC PANEL
ALT: 10 U/L (ref 0–44)
AST: 11 U/L — ABNORMAL LOW (ref 15–41)
Albumin: 2.9 g/dL — ABNORMAL LOW (ref 3.5–5.0)
Alkaline Phosphatase: 80 U/L (ref 38–126)
Anion gap: 10 (ref 5–15)
BUN: 19 mg/dL (ref 6–20)
CO2: 30 mmol/L (ref 22–32)
Calcium: 8.8 mg/dL — ABNORMAL LOW (ref 8.9–10.3)
Chloride: 99 mmol/L (ref 98–111)
Creatinine, Ser: 0.63 mg/dL (ref 0.44–1.00)
GFR calc Af Amer: >60 mL/min (ref >60)
GFR calc non Af Amer: >60 mL/min (ref >60)
Glucose, Bld: 99 mg/dL (ref 70–99)
Potassium: 4.3 mmol/L (ref 3.5–5.1)
Sodium: 139 mmol/L (ref 135–145)
Total Bilirubin: 0.5 mg/dL (ref 0.3–1.2)
Total Protein: 6.8 g/dL (ref 6.5–8.1)

## 2019-06-24 LAB — C-REACTIVE PROTEIN: CRP: 3.8 mg/dL — ABNORMAL HIGH (ref <1.0)

## 2019-06-24 LAB — D-DIMER, QUANTITATIVE: D-Dimer, Quant: 1.18 ug/mL-FEU — ABNORMAL HIGH (ref 0.00–0.50)

## 2019-06-24 MED ORDER — AEROCHAMBER PLUS FLO-VU MISC
1.0000 | Freq: Once | Status: DC
  Filled 2019-06-24: qty 1

## 2019-06-24 MED ORDER — MORPHINE SULFATE (PF) 2 MG/ML IV SOLN
1.0000 mg | INTRAVENOUS | Status: AC | PRN
  Administered 2019-06-24 – 2019-06-26 (×7): 1 mg via INTRAVENOUS
  Filled 2019-06-24 (×9): qty 1

## 2019-06-24 MED ORDER — DIPHENHYDRAMINE HCL 25 MG PO CAPS
25.0000 mg | ORAL_CAPSULE | Freq: Four times a day (QID) | ORAL | Status: DC | PRN
  Administered 2019-06-24 – 2019-06-25 (×3): 25 mg via ORAL
  Filled 2019-06-24 (×3): qty 1

## 2019-06-24 MED ORDER — ENOXAPARIN SODIUM 40 MG/0.4ML ~~LOC~~ SOLN
40.0000 mg | Freq: Two times a day (BID) | SUBCUTANEOUS | Status: DC
  Administered 2019-06-24 – 2019-07-01 (×15): 40 mg via SUBCUTANEOUS
  Filled 2019-06-24 (×15): qty 0.4

## 2019-06-24 MED ORDER — METHYLPREDNISOLONE SODIUM SUCC 125 MG IJ SOLR
80.0000 mg | Freq: Two times a day (BID) | INTRAMUSCULAR | Status: DC
  Administered 2019-06-24 – 2019-06-28 (×9): 80 mg via INTRAVENOUS
  Filled 2019-06-24 (×10): qty 2

## 2019-06-24 MED ORDER — DEXAMETHASONE SODIUM PHOSPHATE 10 MG/ML IJ SOLN
6.0000 mg | Freq: Once | INTRAMUSCULAR | Status: AC
  Administered 2019-06-24: 6 mg via INTRAVENOUS
  Filled 2019-06-24: qty 1

## 2019-06-24 MED ORDER — FUROSEMIDE 10 MG/ML IJ SOLN
40.0000 mg | Freq: Once | INTRAMUSCULAR | Status: AC
  Administered 2019-06-24: 40 mg via INTRAVENOUS
  Filled 2019-06-24 (×2): qty 4

## 2019-06-24 MED ORDER — BENZONATATE 100 MG PO CAPS
100.0000 mg | ORAL_CAPSULE | Freq: Three times a day (TID) | ORAL | Status: DC
  Administered 2019-06-24 (×3): 100 mg via ORAL
  Filled 2019-06-24 (×3): qty 1

## 2019-06-24 MED ORDER — ALBUTEROL SULFATE HFA 108 (90 BASE) MCG/ACT IN AERS
4.0000 | INHALATION_SPRAY | RESPIRATORY_TRACT | Status: DC | PRN
  Filled 2019-06-24: qty 6.7

## 2019-06-24 MED ORDER — SODIUM CHLORIDE 0.9% IV SOLUTION: Freq: Once | INTRAVENOUS | Status: DC

## 2019-06-24 MED ORDER — CLONAZEPAM 1 MG PO TABS
1.0000 mg | ORAL_TABLET | Freq: Three times a day (TID) | ORAL | Status: DC | PRN
  Administered 2019-06-24 – 2019-07-01 (×18): 1 mg via ORAL
  Filled 2019-06-24 (×19): qty 1

## 2019-06-24 MED ORDER — CHLORHEXIDINE GLUCONATE CLOTH 2 % EX PADS
6.0000 | MEDICATED_PAD | Freq: Every day | CUTANEOUS | Status: DC
  Administered 2019-06-24 – 2019-07-02 (×9): 6 via TOPICAL

## 2019-06-24 MED ORDER — ALBUTEROL SULFATE HFA 108 (90 BASE) MCG/ACT IN AERS
4.0000 | INHALATION_SPRAY | Freq: Once | RESPIRATORY_TRACT | Status: AC
  Administered 2019-06-24: 12:00:00 4 via RESPIRATORY_TRACT
  Filled 2019-06-24: qty 6.7

## 2019-06-24 MED ORDER — HYDROXYZINE HCL 25 MG PO TABS
25.0000 mg | ORAL_TABLET | Freq: Four times a day (QID) | ORAL | Status: DC | PRN
  Administered 2019-06-24 – 2019-07-01 (×9): 25 mg via ORAL
  Filled 2019-06-24 (×11): qty 1

## 2019-06-24 MED ORDER — GUAIFENESIN-DM 100-10 MG/5ML PO SYRP
15.0000 mL | ORAL_SOLUTION | ORAL | Status: DC | PRN
  Administered 2019-06-24 – 2019-06-25 (×3): 15 mL via ORAL
  Filled 2019-06-24: qty 20
  Filled 2019-06-24 (×2): qty 15

## 2019-06-24 NOTE — Consult Note (Addendum)
NAME:  Susan GRISSETT, MRN:  [id number], DOB:  Feb 15, 1961, LOS: 2 ADMISSION DATE:  06/26/2019, CONSULTATION DATE:  06/24/2019 REFERRING MD: Vance Gather, CHIEF COMPLAINT: Respiratory failure, COVID-19 pneumonia  Brief History   58 year old lady was admitted 06/23/2019 with COVID-19 History of metastatic squamous cell lung cancer in 2019 status post resection recurrence in 2020 undergoing chemoradiation-has had 6 courses of chemotherapy, 30 radiation sessions. History of laryngeal cancer status post chemoradiation in 2009 Presented with worsening shortness of breath Transferred from [facility] for COVID infection Decompensated this morning with increasing oxygen requirement, now 100% Transferred to the ICU  History of present illness   She is limited at baseline She has chronic respiratory failure on home oxygen at night Chronic pain syndrome,Anxiety and depression, only able to take about 5 steps at baseline, while undergoing testing for a colonoscopy was found to be positive for COVID, admitted to being short of breath and presented to the hospital for the same She does have productive, has had chills,  No fever.  She felt her symptoms were related to her ongoing cancer Last chemo session was 2 weeks ago, for shortness of breath in the last week-was on steroids  Past Medical History   Past Medical History:  Diagnosis Date  . Anemia    vitamin b12 deficiency. no longer taking supplements  . Anxiety   . Arthritis   . Asthma   . Back pain   . Bruises easily   . Chronic leg pain    BILATERAL  . COPD (chronic obstructive pulmonary disease) (HCC)    no inhalers for over 1 year  . Depression   . Difficulty sleeping   . Diverticulitis   . Dyspnea    due to having part of lung removed in 2019  . Dysrhythmia 04/2018   brady episodes. cleared by dr. Clayborn Bigness  . Facet syndrome, lumbar 03/28/2015  . GERD (gastroesophageal reflux disease)   . Hemorrhoids   . History of kidney stones  04/2018   recently passed stone  . Hyperlipidemia   . Incontinence of urine   . Laryngeal cancer ([place]) 2009   LARYNX CANCER - CHEMO / RADIATION (NO SURGERY)   . Migraine   . Migraine without aura and without status migrainosus, not intractable 09/11/2016  . Mild cognitive impairment 11/23/2017  . MRSA infection greater than 3 months ago    2008 right side of face  . Nerve damage    right leg. thinks this is from her TKR  . Nodule of lower lobe of right lung 04/22/2018  . Oxygen deficiency    2L/HS  . Primary cancer of right lower lobe of lung ([place]) 04/18/2018   Surgical resection of RLL  . Wears dentures    full upper and lower     Significant Hospital Events   Decompensation on 06/24/2019-worsening oxygen requirement Transfer to the ICU 06/24/2019  Consults:  PCCM 06/24/2019  Procedures:  None  Significant Diagnostic Tests:  Chest x-ray with bilateral infiltrate worse on the left, progressive on the left  Micro Data:  SARS coronavirus testing +06/20/2019  Antimicrobials:  Remdesivir 9/3>>  Interim history/subjective:  Worsening oxygen requirement Stable on increased supplemental oxygen  Objective   Blood pressure 110/74, pulse (!) 114, temperature 97.8 F (36.6 C), temperature source Axillary, resp. rate 19, height 5\' 2"  (1.575 m), weight 85.9 kg, SpO2 100 %.    FiO2 (%):  [100 %] 100 %  No intake or output data in the 24 hours  ending 06/24/19 1355 Filed Weights   07/19/2019 0501  Weight: 85.9 kg    Examination: General: Chronically ill looking HENT: Dry oral mucosa Lungs: Decreased air entry, rhonchi Cardiovascular: S1-S2 appreciated Abdomen: Soft, bowel sounds appreciated Extremities: No clubbing Neuro: Alert and oriented x3 GU: [facility] Problem list     Assessment & Plan:  Coronavirus infection Pneumonia due to COVID infection  -Continue Remdesivir -Continue steroids -Supportive measures -We covalesent plasma -Anticoagulation   Acute on chronic respiratory failure -Oxygen supplementation -bronchodilators  Chronic obstructive pulmonary disease -Bronchodilators -Steroids  Metastatic lung cancer -Post wedge resection right lower lobe -On chemotherapy-last course was 2 weeks ago, on radiation treatments  Deconditioned -Poor baseline status  Chronic pain -Opiates  Pancytopenia -Related to chemotherapy  I did have an extensive discussion with the patient regarding her worsening status and prognosis She is comfortable on oxygen supplementation at the present time, no use of accessory muscles, patient states she feels comfortable We will continue oxygen supplementation She may require intubation, this was discussed with the patient, discussed with the patient spouse-they will want her intubated if needed She remains a full code-I did discuss the fact that she has very poor functional state at baseline having this severe infection places her at a disadvantage respect to recovery  Continue aggressive lines of care at present  Risk of decompensation-very high Will give an extra dose of Decadron Being diuresed  Best practice:  Diet: Regular Pain/Anxiety/Delirium protocol (if indicated): Per hospitalist VAP protocol (if indicated): Not applicable DVT prophylaxis: On anticoagulation GI prophylaxis: On Protonix Mobility: Bedrest Code Status: Full code Family Communication: Spouse updated Disposition: ICU  Labs   CBC: Recent Labs  Lab 06/21/19 1611 07/01/2019 0430 06/23/19 0000 06/24/19 0358 06/24/19 1344  WBC 2.2* 4.0 3.6* 3.9*  --   NEUTROABS 2.0 3.5 3.2 3.1  --   HGB 7.6* 7.2* 8.4* 9.0* 9.9*  HCT 21.8* 22.6* 25.8* 28.8* 29.0*  MCV 92.8 94.2 95.6 96.0  --   PLT 91* 87* 80* 92*  --     Basic Metabolic Panel: Recent Labs  Lab 06/21/19 1611 06/30/2019 0430 06/23/19 0000 06/24/19 0358 06/24/19 1344  NA 138 139 140 139 136  K 3.4* 3.6 4.2 4.3 4.0  CL 100 100 103 99  --   CO2 28 29 29 30   --    GLUCOSE 223* 146* 131* 99  --   BUN 10 14 15 19   --   CREATININE 0.68 0.62 0.69 0.63  --   CALCIUM 8.6* 8.8* 8.6* 8.8*  --    GFR: Estimated Creatinine Clearance: 77 mL/min (by C-G formula based on SCr of 0.63 mg/dL). Recent Labs  Lab 06/21/19 1611 06/26/2019 0430 06/23/19 0000 06/24/19 0358  PROCALCITON <0.10  --   --   --   WBC 2.2* 4.0 3.6* 3.9*    Liver Function Tests: Recent Labs  Lab 06/21/19 1611 07/07/2019 0430 06/23/19 0000 06/24/19 0358  AST 16 12* 10* 11*  ALT 11 10 10 10   ALKPHOS 80 76 78 80  BILITOT 0.6 0.5 0.5 0.5  PROT 6.6 6.6 6.5 6.8  ALBUMIN 2.7* 2.7* 2.7* 2.9*   No results for input(s): LIPASE, AMYLASE in the last 168 hours. No results for input(s): AMMONIA in the last 168 hours.  ABG    Component Value Date/Time   PHART 7.498 (H) 06/24/2019 1344   PCO2ART 42.8 06/24/2019 1344   PO2ART 143.0 (H) 06/24/2019 1344   HCO3 33.5 (H) 06/24/2019 1344  TCO2 35 (H) 06/24/2019 1344   O2SAT 99.0 06/24/2019 1344     Coagulation Profile: No results for input(s): INR, PROTIME in the last 168 hours.  Cardiac Enzymes: No results for input(s): CKTOTAL, CKMB, CKMBINDEX, TROPONINI in the last 168 hours.  HbA1C: No results found for: HGBA1C  CBG: No results for input(s): GLUCAP in the last 168 hours.  Review of Systems:   Review of systems significant for malaise, fatigue, chills Cough, shortness of breath, wheezing Heartburn Back pain Nervousness/anxiety  Past Medical History  She,  has a past medical history of Anemia, Anxiety, Arthritis, Asthma, Back pain, Bruises easily, Chronic leg pain, COPD (chronic obstructive pulmonary disease) ([place]), Depression, Difficulty sleeping, Diverticulitis, Dyspnea, Dysrhythmia (04/2018), Facet syndrome, lumbar (03/28/2015), GERD (gastroesophageal reflux disease), Hemorrhoids, History of kidney stones (04/2018), Hyperlipidemia, Incontinence of urine, Laryngeal cancer ([place]) (2009), Migraine, Migraine without aura and  without status migrainosus, not intractable (09/11/2016), Mild cognitive impairment (11/23/2017), MRSA infection greater than 3 months ago, Nerve damage, Nodule of lower lobe of right lung (04/22/2018), Oxygen deficiency, Primary cancer of right lower lobe of lung ([place]) (04/18/2018), and Wears dentures.   Surgical History    Past Surgical History:  Procedure Laterality Date  . ABDOMINAL HYSTERECTOMY  1983  . BLADDER SUSPENSION  2014  . CATARACT EXTRACTION W/PHACO Left 05/11/2018   Procedure: CATARACT EXTRACTION PHACO AND INTRAOCULAR LENS PLACEMENT ([facility]) LEFT;  Surgeon: Leandrew Koyanagi, MD;  Location: [facility];  Service: Ophthalmology;  Laterality: Left;  PREFERS EARLY  . CATARACT EXTRACTION W/PHACO Right 08/11/2018   Procedure: CATARACT EXTRACTION PHACO AND INTRAOCULAR LENS PLACEMENT ([facility]);  Surgeon: Leandrew Koyanagi, MD;  Location: [facility];  Service: Ophthalmology;  Laterality: Right;  CDE 5:02 Total Time 01:35 Fluid lot # [id number]  . CHOLECYSTECTOMY  1998  . COLONOSCOPY    . COLONOSCOPY WITH PROPOFOL N/A 04/28/2019   Procedure: COLONOSCOPY WITH PROPOFOL;  Surgeon: Jonathon Bellows, MD;  Location: [facility] ENDOSCOPY;  Service: Gastroenterology;  Laterality: N/A;  . ENDOBRONCHIAL ULTRASOUND N/A 03/20/2019   Procedure: ENDOBRONCHIAL ULTRASOUND;  Surgeon: Flora Lipps, MD;  Location: [facility];  Service: Cardiopulmonary;  Laterality: N/A;  . ESOPHAGOGASTRODUODENOSCOPY  2015  . ESOPHAGOGASTRODUODENOSCOPY (EGD) WITH PROPOFOL N/A 11/20/2016   Procedure: ESOPHAGOGASTRODUODENOSCOPY (EGD) WITH PROPOFOL;  Surgeon: Jonathon Bellows, MD;  Location: [facility] ENDOSCOPY;  Service: Endoscopy;  Laterality: N/A;  . ESOPHAGOGASTRODUODENOSCOPY (EGD) WITH PROPOFOL N/A 04/28/2019   Procedure: ESOPHAGOGASTRODUODENOSCOPY (EGD) WITH PROPOFOL;  Surgeon: Jonathon Bellows, MD;  Location: [facility];  Service: Gastroenterology;  Laterality: N/A;  Pt needs appointment after 9:30am  . EXCISION NEUROMA Bilateral 09/24/2014    Procedure: BILATERAL OPEN SAPHENOUS NEURECTOMIES AND OPEN LEFT PERIPATELLA OSETOPHYTECTOMY;  Surgeon: Mauri Pole, MD;  Location: [facility];  Service: Orthopedics;  Laterality: Bilateral;  . EYE SURGERY Left 05/11/2018   cataract extraction  . JOINT REPLACEMENT  2009/2010   BIL TOTAL KNEES  . JOINT REPLACEMENT  2020   left  . PORTACATH PLACEMENT N/A 04/05/2019   Procedure: INSERTION PORT-A-CATH;  Surgeon: Nestor Lewandowsky, MD;  Location: [facility];  Service: General;  Laterality: N/A;  . THORACOTOMY/LOBECTOMY Right 05/16/2018   Procedure: THORACOTOMY/POSSIBLE LOBECTOMY;  Surgeon: Nestor Lewandowsky, MD;  Location: [facility];  Service: Thoracic;  Laterality: Right;  . TONSILLECTOMY    . TOTAL KNEE REVISION Left 11/29/2018   Procedure: Left TOTAL KNEE REVISION;  Surgeon: Hessie Knows, MD;  Location: [facility];  Service: Orthopedics;  Laterality: Left;  Marland Kitchen VIDEO BRONCHOSCOPY N/A 05/16/2018   Procedure: PREOP  BRONCHOSCOPY;  Surgeon: Genevive Bi,  Christia Reading, MD;  Location: [facility];  Service: Thoracic;  Laterality: N/A;     Social History   reports that she quit smoking about 3 years ago. Her smoking use included cigarettes. She smoked 0.50 packs per day. She has never used smokeless tobacco. She reports that she does not drink alcohol or use drugs.   Family History   Her family history includes Alcohol abuse in her brother; Alzheimer's disease in her paternal grandfather; Anxiety disorder in her sister; Arthritis in her father; Asthma in her mother; Breast cancer (age of onset: 54) in her maternal grandmother; Cancer in her father and maternal grandfather; Cirrhosis in her mother; Depression in her sister; Diabetes in her mother and sister; Gout in her sister; Heart disease in her brother; Hyperlipidemia in her father and mother; Hypertension in her father, mother, and sister; Kidney Stones in her son; Pneumonia in her paternal grandmother; Stroke in her maternal grandmother.   Allergies Allergies  Allergen Reactions   . Morphine And Related Itching    Can tolerate with benadryl  . Cymbalta [Duloxetine Hcl] Other (See Comments)    Pt states it keeps her awake  . Adhesive [Tape] Other (See Comments)    Plastic tape rips skin and bruises her. PLEASE USE PAPER TAPE  . Lexapro [Escitalopram Oxalate] Other (See Comments)    Keeps awake for days.     The patient is critically ill with multiple organ systems failure and requires high complexity decision making for assessment and support, frequent evaluation and titration of therapies, application of advanced monitoring technologies and extensive interpretation of multiple databases. Critical Care Time devoted to patient care services described in this note independent of APP/resident time (if applicable)  is 35 minutes.   Sherrilyn Rist MD [facility] Personal pager: [phone] If unanswered, please page CCM On-call: [phone]

## 2019-06-24 NOTE — Progress Notes (Signed)
Purse sealed in paper bag, security bag of belongings brought to security to be secured in safe. Medications found in purse sent pharmacy

## 2019-06-24 NOTE — Progress Notes (Signed)
Husband called and updated on plan of care and transfer to ICU for oxygenation requirements. Patient belongings, (wedding band, engagement ring, purse, wallet, vape pen and medications) will be sent to Security to be picked up tomorrow by Husband. No further questions at this time.

## 2019-06-24 NOTE — Progress Notes (Signed)
PROGRESS NOTE  Susan Houston  YNW:[id number] DOB: 12-08-1960 DOA: 06/30/2019 PCP: Valerie Roys, DO  Brief Narrative: Susan Houston is a 58 y.o. female with a history of chronic hypoxic respiratory failure, COPD, metastatic squamous cell lung CA 2019 s/p resection and recurrence 2020 undergoing chemotherapy and radiation, laryngeal CA s/p chemo and radiation 2009, depression, chronic pain who presented to the ED by EMS 9/2 with increasing shortness of breath and cough. She was scheduled for repeat EGD on 9/4 and preadmission covid screen was found to be positive 9/1.During a televisit with PCP 9/2, she reported increasing dyspnea, chills, and cough as well as needing to increase supplemental oxygen at home. PCP arranged EMS transport to ED where CXR demonstrated bilateral patchy opacities on chronic interstitial changes and scarring. She was requiring 6L O2. CRP elevated at 19 with normal procalcitonin. Steroids and remdesivir started and the patient was admitted to [facility]. Hemoglobin decreased in the setting of pancytopenia and 1u PRBCs given 9/3 with improvement. Despite precipitous decline in inflammatory markers, on 9/5 she demonstrated worsening hypoxia. CXR is ordered, convalescent plasma ordered, steroids augmented, albuterol MDI w/spacer added to combivent scheduled. Discussion with the patient and husband has confirmed desire for full code/aggressive measures. PCCM is consulted and oxygenation is being sustained without respiratory distress at 10LPM.   Assessment & Plan: Principal Problem:   Pneumonia due to COVID-19 virus Active Problems:   Chronic anxiety   COPD (chronic obstructive pulmonary disease) ([place])   Primary cancer of right lower lobe of lung (HCC) - metastatic squamous cell   Chronic pain syndrome   Long term current use of opiate analgesic   MDD (major depressive disorder), recurrent episode, moderate (HCC)   GAD (generalized anxiety disorder)   Acute on chronic respiratory failure  with hypoxia (HCC) - on 2 L/min at night   Anemia of chronic disease  Acute on chronic hypoxemic respiratory failure due to covid-19 pneumonia: Felt to be at risk of progression to ARDS with recurrent lung cancer, COPD, frail functional status, undergoing chemotherapy.  - Continue IV steroids x7-10 days - Continue remdesivir (9/3 - 9/7) - Starting to worsen, will repeat CXR, start albuterol through spacer and augment steroids due to wheezing, and trial lasix IV x1.  - PCCM consulted.  - Discussed at length the use of convalescent plasma, it's use under FDA's EUA. Specifically stated that there is no proof of benefit and as a blood product we know there are risks, including those we may not have discovered yet. I recommended administering this today and both the patient and husband give consent. - Continue airborne, contact precautions. PPE including surgical gown, gloves, cap, shoe covers, and CAPR used during this encounter in a negative pressure room.  - Check daily labs: CBC w/diff, CMP, d-dimer, ferritin, CRP. CRP grossly improved today. - Enoxaparin prophylactic dose. - Avoid NSAIDs - Discussed use of incentive spirometry and proning though pain will limit this. - Use of tocilizumab not recommended in setting of chemotherapy.  - Goals of care were discussed with patient and husband. Prognosis is guarded. Full code, would want trial of intubation. - Continue PT/OT to assist with mobility.  - Antitussives as ordered, augment by increasing robitussin to 19ml and adding scheduled tessalon. Has itching reaction to morphine and already on high risk sedatives, so will not add codeine-containing cough syrup.   RLL metastatic squamous cell lung CA: Had initial resection July 2019, recurrence 2020. - Undergoing chemotherapy and radiation, supervised by Dr. Rogue Bussing who  is aware of admission.   COPD: Under the care of pulmonology, Dr. Mortimer Fries - Continue controller medications, combivent, prn  albuterol MDI and steroids as above.   MDD, GAD:  - Continue SSRI, prn clonazepam, augmented dose with patient's evident anxiety worsening pulmonary effort.  Chronic pain, malignancy-associated pain:  - No evidence of sedation, changed percocet 5/325mg  (PTA med) to oxyIR 5-10mg  prn mod/severe pain and continue tylenol for alternative indications. No changes planned to outpatient regimen at discharge, i.e. no prescriptions will be provided.  - Continue lyrica.  Chemotherapy-induced pancytopenia, symptomatic anemia, and anemia of chronic disease: s/p 1u PRBCs 9/3 with improvement as expected.  - Continue to monitor CBC daily or prn bleeding. As hgb has remained stable, there is no active bleeding or hemodynamic instability to suggest occult bleeding, the benefit of prophylactic-dose lovenox is felt to outweigh risk currently.  Obesity: - Dietitian consulted  DVT prophylaxis: Lovenox 40mg  q24h Code Status: Full Family Communication: Husband by phone twice so far this morning. Disposition Plan: Uncertain. PT/OT consulted, currently recommending return home w/HH, though she was only able to stand EOB and desaturated despite 6L O2. Will continue assessments.   Consultants:   PCCM, Dr. Ander Slade  Procedures:   Covid convalescent plasma 06/24/2019  Antimicrobials:  Remdesivir   Subjective: Feels worse today, had to go up to 6L O2 this morning and having severe, abruptly worsening nonproductive cough. No chest pain, but severely short of breath during coughing spells.  Objective: Vitals:   06/23/19 1945 06/24/19 0000 06/24/19 0350 06/24/19 0735  BP: 128/78 127/76 132/74 112/74  Pulse:    84  Resp:    20  Temp: 98.1 F (36.7 C) 98.2 F (36.8 C) 98.1 F (36.7 C) 97.8 F (36.6 C)  TempSrc: Oral  Oral Oral  SpO2: 96%   94%  Weight:      Height:       No intake or output data in the 24 hours ending 06/24/19 1146 Filed Weights   07/04/2019 0501  Weight: 85.9 kg   Gen:  Chronically ill-appearing and anxious 58yo F  Pulm: Tachypneic without accessory muscle use, drastic worsening in pulmonary exam with diffuse coarse breath sounds, inspiratory and expiratory rhonchi and wheezing. CV: Regular rate and rhythm. No murmur, rub, or gallop. No JVD, no dependent edema. GI: Abdomen soft, non-tender, non-distended, with normoactive bowel sounds.  Ext: Warm, no deformities Skin: No new rashes, lesions or ulcers on visualized skin. Diffuse ecchymoses stable.  Neuro: Alert and oriented. No focal neurological deficits. Psych: Judgement and insight appear fair. Mood anxious & affect congruent. Behavior is appropriate. Clearly competent to make medical decisions.   Data Reviewed: I have personally reviewed following labs and imaging studies  CBC: Recent Labs  Lab 06/21/19 1611 07/01/2019 0430 06/23/19 0000 06/24/19 0358  WBC 2.2* 4.0 3.6* 3.9*  NEUTROABS 2.0 3.5 3.2 3.1  HGB 7.6* 7.2* 8.4* 9.0*  HCT 21.8* 22.6* 25.8* 28.8*  MCV 92.8 94.2 95.6 96.0  PLT 91* 87* 80* 92*   Basic Metabolic Panel: Recent Labs  Lab 06/21/19 1611 06/21/2019 0430 06/23/19 0000 06/24/19 0358  NA 138 139 140 139  K 3.4* 3.6 4.2 4.3  CL 100 100 103 99  CO2 28 29 29 30   GLUCOSE 223* 146* 131* 99  BUN 10 14 15 19   CREATININE 0.68 0.62 0.69 0.63  CALCIUM 8.6* 8.8* 8.6* 8.8*   GFR: Estimated Creatinine Clearance: 77 mL/min (by C-G formula based on SCr of 0.63 mg/dL). Liver Function Tests:  Recent Labs  Lab 06/21/19 1611 06/29/2019 0430 06/23/19 0000 06/24/19 0358  AST 16 12* 10* 11*  ALT 11 10 10 10   ALKPHOS 80 76 78 80  BILITOT 0.6 0.5 0.5 0.5  PROT 6.6 6.6 6.5 6.8  ALBUMIN 2.7* 2.7* 2.7* 2.9*   Anemia Panel: Recent Labs    06/21/19 1611 07/09/2019 0430  FERRITIN 186 142   Urine analysis:    Component Value Date/Time   COLORURINE YELLOW (A) 03/03/2019 1503   APPEARANCEUR Clear 03/09/2019 1344   LABSPEC 1.010 03/03/2019 1503   LABSPEC 1.012 10/19/2013 1810   PHURINE  7.0 03/03/2019 1503   GLUCOSEU Negative 03/09/2019 1344   GLUCOSEU Negative 10/19/2013 1810   HGBUR LARGE (A) 03/03/2019 1503   BILIRUBINUR Negative 03/09/2019 1344   BILIRUBINUR Negative 10/19/2013 1810   KETONESUR NEGATIVE 03/03/2019 1503   PROTEINUR Negative 03/09/2019 1344   PROTEINUR NEGATIVE 03/03/2019 1503   UROBILINOGEN 1.0 09/07/2014 1157   NITRITE Negative 03/09/2019 1344   NITRITE NEGATIVE 03/03/2019 1503   LEUKOCYTESUR Trace (A) 03/09/2019 1344   LEUKOCYTESUR MODERATE (A) 03/03/2019 1503   LEUKOCYTESUR 1+ 10/19/2013 1810   Recent Results (from the past 240 hour(s))  SARS CORONAVIRUS 2 (TAT 6-24 HRS) Nasopharyngeal Nasopharyngeal Swab     Status: Abnormal   Collection Time: 06/20/19 11:37 AM   Specimen: Nasopharyngeal Swab  Result Value Ref Range Status   SARS Coronavirus 2 POSITIVE (A) NEGATIVE Final    Comment: (NOTE) SARS-CoV-2 target nucleic acids are DETECTED. The SARS-CoV-2 RNA is generally detectable in upper and lower respiratory specimens during the acute phase of infection. Positive results are indicative of active infection with SARS-CoV-2. Clinical  correlation with patient history and other diagnostic information is necessary to determine patient infection status. Positive results do  not rule out bacterial infection or co-infection with other viruses. The expected result is Negative. Fact Sheet for Patients: SugarRoll.be Fact Sheet for Healthcare Providers: https://www.woods-mathews.com/ This test is not yet approved or cleared by the [place] FDA and  has been authorized for detection and/or diagnosis of SARS-CoV-2 by FDA under an Emergency Use Authorization (EUA). This EUA will remain  in effect (meaning this test can be used) for the duration of the COVID-19 declaration under Section 564(b)(1) of the Act, 21 U.S.C.  section 360bbb-3(b)(1), unless the authorization is terminated or revoked sooner.  Performed at [facility], [place] [address], [place], [place] [address]       Radiology Studies: No results found.  Scheduled Meds: . sodium chloride   Intravenous Once  . aerochamber plus with mask  1 each Other Once  . aspirin EC  81 mg Oral Daily  . benzonatate  100 mg Oral TID  . enoxaparin (LOVENOX) injection  40 mg Subcutaneous Q24H  . feeding supplement (ENSURE ENLIVE)  237 mL Oral TID BM  . FLUoxetine  80 mg Oral Daily  . Ipratropium-Albuterol  1 puff Inhalation Q6H  . methylPREDNISolone (SOLU-MEDROL) injection  80 mg Intravenous Q12H  . pantoprazole  40 mg Oral Daily  . pregabalin  75 mg Oral TID  . sucralfate  1 g Oral TID  . Suvorexant  20 mg Oral QHS   Continuous Infusions: . remdesivir 100 mg in NS 250 mL Stopped (06/23/19 2210)     LOS: 2 days   Time spent: 35 minutes.  Patrecia Pour, MD [facility] [email or web address] Password [email or web address] 06/24/2019, 11:46 AM

## 2019-06-24 NOTE — Plan of Care (Signed)

## 2019-06-25 ENCOUNTER — Inpatient Hospital Stay (HOSPITAL_COMMUNITY): Payer: Medicare Other

## 2019-06-25 LAB — PROCALCITONIN: Procalcitonin: <0.1 ng/mL

## 2019-06-25 LAB — PREPARE FRESH FROZEN PLASMA

## 2019-06-25 LAB — COMPREHENSIVE METABOLIC PANEL
ALT: 12 U/L (ref 0–44)
AST: 13 U/L — ABNORMAL LOW (ref 15–41)
Albumin: 3.1 g/dL — ABNORMAL LOW (ref 3.5–5.0)
Alkaline Phosphatase: 84 U/L (ref 38–126)
Anion gap: 12 (ref 5–15)
BUN: 20 mg/dL (ref 6–20)
CO2: 34 mmol/L — ABNORMAL HIGH (ref 22–32)
Calcium: 9.1 mg/dL (ref 8.9–10.3)
Chloride: 92 mmol/L — ABNORMAL LOW (ref 98–111)
Creatinine, Ser: 0.62 mg/dL (ref 0.44–1.00)
GFR calc Af Amer: >60 mL/min (ref >60)
GFR calc non Af Amer: >60 mL/min (ref >60)
Glucose, Bld: 153 mg/dL — ABNORMAL HIGH (ref 70–99)
Potassium: 3.9 mmol/L (ref 3.5–5.1)
Sodium: 138 mmol/L (ref 135–145)
Total Bilirubin: 0.5 mg/dL (ref 0.3–1.2)
Total Protein: 7.3 g/dL (ref 6.5–8.1)

## 2019-06-25 LAB — CBC WITH DIFFERENTIAL/PLATELET
Abs Immature Granulocytes: 0.1 10*3/uL — ABNORMAL HIGH (ref 0.00–0.07)
Basophils Absolute: 0 10*3/uL (ref 0.0–0.1)
Basophils Relative: 0 %
Eosinophils Absolute: 0 10*3/uL (ref 0.0–0.5)
Eosinophils Relative: 0 %
HCT: 29.6 % — ABNORMAL LOW (ref 36.0–46.0)
Hemoglobin: 9.5 g/dL — ABNORMAL LOW (ref 12.0–15.0)
Immature Granulocytes: 1 %
Lymphocytes Relative: 2 %
Lymphs Abs: 0.2 10*3/uL — ABNORMAL LOW (ref 0.7–4.0)
MCH: 30.8 pg (ref 26.0–34.0)
MCHC: 32.1 g/dL (ref 30.0–36.0)
MCV: 96.1 fL (ref 80.0–100.0)
Monocytes Absolute: 0.5 10*3/uL (ref 0.1–1.0)
Monocytes Relative: 6 %
Neutro Abs: 6.8 10*3/uL (ref 1.7–7.7)
Neutrophils Relative %: 91 %
Platelets: 120 10*3/uL — ABNORMAL LOW (ref 150–400)
RBC: 3.08 MIL/uL — ABNORMAL LOW (ref 3.87–5.11)
RDW: 20.3 % — ABNORMAL HIGH (ref 11.5–15.5)
WBC: 7.5 10*3/uL (ref 4.0–10.5)
nRBC: 0 % (ref 0.0–0.2)

## 2019-06-25 LAB — D-DIMER, QUANTITATIVE: D-Dimer, Quant: 0.91 ug/mL-FEU — ABNORMAL HIGH (ref 0.00–0.50)

## 2019-06-25 LAB — POCT I-STAT 7, (LYTES, BLD GAS, ICA,H+H)
Acid-Base Excess: 5 mmol/L — ABNORMAL HIGH (ref 0.0–2.0)
Bicarbonate: 30.2 mmol/L — ABNORMAL HIGH (ref 20.0–28.0)
Calcium, Ion: 1.2 mmol/L (ref 1.15–1.40)
HCT: 28 % — ABNORMAL LOW (ref 36.0–46.0)
Hemoglobin: 9.5 g/dL — ABNORMAL LOW (ref 12.0–15.0)
O2 Saturation: 92 %
Patient temperature: 97.6
Potassium: 3.9 mmol/L (ref 3.5–5.1)
Sodium: 138 mmol/L (ref 135–145)
TCO2: 32 mmol/L (ref 22–32)
pCO2 arterial: 44.8 mmHg (ref 32.0–48.0)
pH, Arterial: 7.434 (ref 7.350–7.450)
pO2, Arterial: 61 mmHg — ABNORMAL LOW (ref 83.0–108.0)

## 2019-06-25 LAB — BPAM FFP
Blood Product Expiration Date: 202009061420
ISSUE DATE / TIME: 202009051540
Unit Type and Rh: 7300

## 2019-06-25 LAB — MRSA PCR SCREENING: MRSA by PCR: NEGATIVE

## 2019-06-25 LAB — C-REACTIVE PROTEIN: CRP: 10.6 mg/dL — ABNORMAL HIGH (ref <1.0)

## 2019-06-25 MED ORDER — FUROSEMIDE 10 MG/ML IJ SOLN
20.0000 mg | Freq: Once | INTRAMUSCULAR | Status: AC
  Administered 2019-06-25: 20 mg via INTRAVENOUS
  Filled 2019-06-25: qty 2

## 2019-06-25 MED ORDER — SENNOSIDES-DOCUSATE SODIUM 8.6-50 MG PO TABS
1.0000 | ORAL_TABLET | Freq: Two times a day (BID) | ORAL | Status: DC
  Administered 2019-06-25 – 2019-06-26 (×3): 1 via ORAL
  Filled 2019-06-25 (×3): qty 1

## 2019-06-25 MED ORDER — POLYETHYLENE GLYCOL 3350 17 G PO PACK
17.0000 g | PACK | Freq: Every day | ORAL | Status: DC
  Administered 2019-06-25 – 2019-06-27 (×3): 17 g via ORAL
  Filled 2019-06-25 (×3): qty 1

## 2019-06-25 MED ORDER — PROMETHAZINE-CODEINE 6.25-10 MG/5ML PO SYRP: 10.0000 mL | ORAL_SOLUTION | Freq: Four times a day (QID) | ORAL | Status: DC | PRN

## 2019-06-25 MED ORDER — BENZONATATE 100 MG PO CAPS
200.0000 mg | ORAL_CAPSULE | Freq: Three times a day (TID) | ORAL | Status: DC
  Administered 2019-06-25 – 2019-07-01 (×19): 200 mg via ORAL
  Filled 2019-06-25 (×20): qty 2

## 2019-06-25 MED ORDER — GUAIFENESIN-CODEINE 100-10 MG/5ML PO SOLN
10.0000 mL | Freq: Four times a day (QID) | ORAL | Status: DC | PRN
  Administered 2019-06-25 – 2019-07-01 (×19): 10 mL via ORAL
  Filled 2019-06-25 (×19): qty 10

## 2019-06-25 NOTE — Progress Notes (Signed)
NAME:  Susan Houston, MRN:  [id number], DOB:  1961-06-28, LOS: 3 ADMISSION DATE:  06/27/2019, CONSULTATION DATE: 06/24/2019 REFERRING MD: Dr. Vance Gather, CHIEF COMPLAINT: Respiratory failure, sepsis COVID-19 pneumonia  Brief History   58 year old lady was admitted 06/23/2019 with COVID-19 History of metastatic squamous cell lung cancer in 2019 status post resection recurrence in 2020 undergoing chemoradiation-has had 6 courses of chemotherapy, 30 radiation sessions. History of laryngeal cancer status post chemoradiation in 2009 Presented with worsening shortness of breath Transferred from [facility] for COVID infection Decompensated this morning with increasing oxygen requirement, now 100% Transferred to the ICU   History of present illness   She is limited at baseline She has chronic respiratory failure on home oxygen at night Chronic pain syndrome,Anxiety and depression, only able to take about 5 steps at baseline, while undergoing testing for a colonoscopy was found to be positive for COVID, admitted to being short of breath and presented to the hospital for the same She does have productive, has had chills,  No fever.  She felt her symptoms were related to her ongoing cancer Last chemo session was 2 weeks ago, for shortness of breath in the last week-was on steroids  Past Medical History   Past Medical History:  Diagnosis Date  . Anemia    vitamin b12 deficiency. no longer taking supplements  . Anxiety   . Arthritis   . Asthma   . Back pain   . Bruises easily   . Chronic leg pain    BILATERAL  . COPD (chronic obstructive pulmonary disease) (HCC)    no inhalers for over 1 year  . Depression   . Difficulty sleeping   . Diverticulitis   . Dyspnea    due to having part of lung removed in 2019  . Dysrhythmia 04/2018   brady episodes. cleared by dr. Clayborn Bigness  . Facet syndrome, lumbar 03/28/2015  . GERD (gastroesophageal reflux disease)   . Hemorrhoids   . History of  kidney stones 04/2018   recently passed stone  . Hyperlipidemia   . Incontinence of urine   . Laryngeal cancer ([place]) 2009   LARYNX CANCER - CHEMO / RADIATION (NO SURGERY)   . Migraine   . Migraine without aura and without status migrainosus, not intractable 09/11/2016  . Mild cognitive impairment 11/23/2017  . MRSA infection greater than 3 months ago    2008 right side of face  . Nerve damage    right leg. thinks this is from her TKR  . Nodule of lower lobe of right lung 04/22/2018  . Oxygen deficiency    2L/HS  . Primary cancer of right lower lobe of lung ([place]) 04/18/2018   Surgical resection of RLL  . Wears dentures    full upper and lower     Significant Hospital Events   Decompensation on 06/24/2019-worsening oxygen requirement Transfer to the ICU 06/24/2019 Received plasma 06/24/2019  Consults:  PCCM 06/24/2019  Procedures:  None  Significant Diagnostic Tests:  Chest x-ray bilateral infiltrate worse on the left, progressive on the left X-ray that was performed during the night did reveal worsening process with hypoventilation-reviewed by myself Micro Data:  SIRS coronavirus testing +06/20/2019  Antimicrobials:  Remdesivir 9/3>>  Interim history/subjective:  Decompensation on 06/24/2019-worsening oxygen requirement She is doing better laying on her right side with saturations 100% FiO2 requirement did improve Significant coughing spells is associated with desaturations Objective   Blood pressure 128/70, pulse (!) 101, temperature (!) 96.6 F (35.9  C), temperature source Axillary, resp. rate (!) 22, height 5\' 2"  (1.575 m), weight 87.7 kg, SpO2 (!) 81 %.    FiO2 (%):  [55 %-100 %] 70 %   Intake/Output Summary (Last 24 hours) at 06/25/2019 1042 Last data filed at 06/25/2019 0600 Gross per 24 hour  Intake 750 ml  Output 2600 ml  Net -1850 ml   Filed Weights   07/13/2019 0501 06/25/19 0120  Weight: 85.9 kg 87.7 kg    Examination: General: Chronically ill looking HENT: Dry  oral mucosa Lungs: Decreased air entry, rhonchi bilaterally Cardiovascular: S1-S2 appreciated Abdomen: Soft, bowel sounds appreciated Extremities: No clubbing Neuro: Alert and oriented x3 GU: [place] [facility] Problem list     Assessment & Plan:  Coronavirus infection Pneumonia due to COVID infection -Continue Remdesivir -Continue steroids -Supportive measures -Post convalescent plasma -Anticoagulation  Acute on chronic respiratory failure -Continue oxygen supplementation -Bronchodilators  Chronic obstructive pulmonary disease -Bronchodilators -Steroids  Metastatic lung cancer -Post wedge resection right lower lobe -On chemotherapy, last course was 2 weeks ago, did receive radiation treatments  Deconditioning -Poor baseline status  Chronic pain -Opiates  Pancytopenia -Related to chemotherapy  Recurrent cough -Optimize antitussives  Appears to be stabilizing from recent decompensation Continue oxygen supplementation Continue bronchodilators  She is a full code May require intubation however, oxygen requirement is not a whole lot worse Risk of decompensation remains very high  Best practice:  Diet: Regular Pain/Anxiety/Delirium protocol (if indicated): Opiates VAP protocol (if indicated): Not indicated DVT prophylaxis: On anticoagulation GI prophylaxis: Protonix Mobility: Bedrest Code Status: Full code Family Communication: Pending Disposition: Remains in ICU  Labs   CBC: Recent Labs  Lab 06/21/19 1611 07/08/2019 0430 06/23/19 0000 06/24/19 0358 06/24/19 1344 06/25/19 0031 06/25/19 0435  WBC 2.2* 4.0 3.6* 3.9*  --   --  7.5  NEUTROABS 2.0 3.5 3.2 3.1  --   --  6.8  HGB 7.6* 7.2* 8.4* 9.0* 9.9* 9.5* 9.5*  HCT 21.8* 22.6* 25.8* 28.8* 29.0* 28.0* 29.6*  MCV 92.8 94.2 95.6 96.0  --   --  96.1  PLT 91* 87* 80* 92*  --   --  120*    Basic Metabolic Panel: Recent Labs  Lab 06/21/19 1611 07/06/2019 0430 06/23/19 0000 06/24/19 0358  06/24/19 1344 06/25/19 0031 06/25/19 0435  NA 138 139 140 139 136 138 138  K 3.4* 3.6 4.2 4.3 4.0 3.9 3.9  CL 100 100 103 99  --   --  92*  CO2 28 29 29 30   --   --  34*  GLUCOSE 223* 146* 131* 99  --   --  153*  BUN 10 14 15 19   --   --  20  CREATININE 0.68 0.62 0.69 0.63  --   --  0.62  CALCIUM 8.6* 8.8* 8.6* 8.8*  --   --  9.1   GFR: Estimated Creatinine Clearance: 77.8 mL/min (by C-G formula based on SCr of 0.62 mg/dL). Recent Labs  Lab 06/21/19 1611 07/12/2019 0430 06/23/19 0000 06/24/19 0358 06/25/19 0435  PROCALCITON <0.10  --   --   --   --   WBC 2.2* 4.0 3.6* 3.9* 7.5    Liver Function Tests: Recent Labs  Lab 06/21/19 1611 06/21/2019 0430 06/23/19 0000 06/24/19 0358 06/25/19 0435  AST 16 12* 10* 11* 13*  ALT 11 10 10 10 12   ALKPHOS 80 76 78 80 84  BILITOT 0.6 0.5 0.5 0.5 0.5  PROT 6.6 6.6 6.5 6.8  7.3  ALBUMIN 2.7* 2.7* 2.7* 2.9* 3.1*   No results for input(s): LIPASE, AMYLASE in the last 168 hours. No results for input(s): AMMONIA in the last 168 hours.  ABG    Component Value Date/Time   PHART 7.434 06/25/2019 0031   PCO2ART 44.8 06/25/2019 0031   PO2ART 61.0 (L) 06/25/2019 0031   HCO3 30.2 (H) 06/25/2019 0031   TCO2 32 06/25/2019 0031   O2SAT 92.0 06/25/2019 0031     Coagulation Profile: No results for input(s): INR, PROTIME in the last 168 hours.  Cardiac Enzymes: No results for input(s): CKTOTAL, CKMB, CKMBINDEX, TROPONINI in the last 168 hours.  HbA1C: No results found for: HGBA1C  CBG: No results for input(s): GLUCAP in the last 168 hours.  Review of Systems:   Shortness of breath Cough Pain is better controlled Alert and oriented  Past Medical History  She,  has a past medical history of Anemia, Anxiety, Arthritis, Asthma, Back pain, Bruises easily, Chronic leg pain, COPD (chronic obstructive pulmonary disease) ([place]), Depression, Difficulty sleeping, Diverticulitis, Dyspnea, Dysrhythmia (04/2018), Facet syndrome, lumbar (03/28/2015),  GERD (gastroesophageal reflux disease), Hemorrhoids, History of kidney stones (04/2018), Hyperlipidemia, Incontinence of urine, Laryngeal cancer ([place]) (2009), Migraine, Migraine without aura and without status migrainosus, not intractable (09/11/2016), Mild cognitive impairment (11/23/2017), MRSA infection greater than 3 months ago, Nerve damage, Nodule of lower lobe of right lung (04/22/2018), Oxygen deficiency, Primary cancer of right lower lobe of lung ([place]) (04/18/2018), and Wears dentures.   Surgical History    Past Surgical History:  Procedure Laterality Date  . ABDOMINAL HYSTERECTOMY  1983  . BLADDER SUSPENSION  2014  . CATARACT EXTRACTION W/PHACO Left 05/11/2018   Procedure: CATARACT EXTRACTION PHACO AND INTRAOCULAR LENS PLACEMENT ([place]) LEFT;  Surgeon: Leandrew Koyanagi, MD;  Location: [facility];  Service: Ophthalmology;  Laterality: Left;  PREFERS EARLY  . CATARACT EXTRACTION W/PHACO Right 08/11/2018   Procedure: CATARACT EXTRACTION PHACO AND INTRAOCULAR LENS PLACEMENT ([facility]);  Surgeon: Leandrew Koyanagi, MD;  Location: [facility];  Service: Ophthalmology;  Laterality: Right;  CDE 5:02 Total Time 01:35 Fluid lot # [id number]  . CHOLECYSTECTOMY  1998  . COLONOSCOPY    . COLONOSCOPY WITH PROPOFOL N/A 04/28/2019   Procedure: COLONOSCOPY WITH PROPOFOL;  Surgeon: Jonathon Bellows, MD;  Location: [facility] END[facility]COPY;  Service: Gastroenterology;  Laterality: N/A;  . ENDOBRONCHIAL ULTRASOUND N/A 03/20/2019   Procedure: ENDOBRONCHIAL ULTRASOUND;  Surgeon: Flora Lipps, MD;  Location: [facility];  Service: Cardiopulmonary;  Laterality: N/A;  . ESOPHAGOGASTRODUODENOSCOPY  2015  . ESOPHAGOGASTRODUODENOSCOPY (EGD) WITH PROPOFOL N/A 11/20/2016   Procedure: ESOPHAGOGASTRODUODENOSCOPY (EGD) WITH PROPOFOL;  Surgeon: Jonathon Bellows, MD;  Location: [facility] ENDOSCOPY;  Service: Endoscopy;  Laterality: N/A;  . ESOPHAGOGASTRODUODENOSCOPY (EGD) WITH PROPOFOL N/A 04/28/2019   Procedure: ESOPHAGOGASTRODUODENOSCOPY (EGD) WITH  PROPOFOL;  Surgeon: Jonathon Bellows, MD;  Location[facility] END[facility]COPY;  Service: Gastroenterology;  Laterality: N/A;  Pt needs appointment after 9:30am  . EXCISION NEUROMA Bilateral 09/24/2014   Procedure: BILATERAL OPEN SAPHENOUS NEURECTOMIES AND OPEN LEFT PERIPATELLA OSETOPHYTECTOMY;  Surgeon: Mauri Pole, MD;  Location: [facility];  Service: Orthopedics;  Laterality: Bilateral;  . EYE SURGERY Left 05/11/2018   cataract extraction  . JOINT REPLACEMENT  2009/2010   BIL TOTAL KNEES  . JOINT REPLACEMENT  2020   left  . PORTACATH PLACEMENT N/A 04/05/2019   Procedure: INSERTION PORT-A-CATH;  Surgeon: Nestor Lewandowsky, MD;  Location: [facility];  Service: General;  Laterality: N/A;  . THORACOTOMY/LOBECTOMY Right 05/16/2018   Procedure: THORACOTOMY/POSSIBLE LOBECTOMY;  Surgeon: Nestor Lewandowsky,  MD;  Location: [facility];  Service: Thoracic;  Laterality: Right;  . TONSILLECTOMY    . TOTAL KNEE REVISION Left 11/29/2018   Procedure: Left TOTAL KNEE REVISION;  Surgeon: Hessie Knows, MD;  Location: [facility];  Service: Orthopedics;  Laterality: Left;  Marland Kitchen VIDEO BRONCHOSCOPY N/A 05/16/2018   Procedure: PREOP  BRONCHOSCOPY;  Surgeon: Nestor Lewandowsky, MD;  Location: [facility];  Service: Thoracic;  Laterality: N/A;     Social History   reports that she quit smoking about 3 years ago. Her smoking use included cigarettes. She smoked 0.50 packs per day. She has never used smokeless tobacco. She reports that she does not drink alcohol or use drugs.   Family History   Her family history includes Alcohol abuse in her brother; Alzheimer's disease in her paternal grandfather; Anxiety disorder in her sister; Arthritis in her father; Asthma in her mother; Breast cancer (age of onset: 23) in her maternal grandmother; Cancer in her father and maternal grandfather; Cirrhosis in her mother; Depression in her sister; Diabetes in her mother and sister; Gout in her sister; Heart disease in her brother; Hyperlipidemia in her father and mother;  Hypertension in her father, mother, and sister; Kidney Stones in her son; Pneumonia in her paternal grandmother; Stroke in her maternal grandmother.   Allergies Allergies  Allergen Reactions  . Morphine And Related Itching    Can tolerate with benadryl  . Cymbalta [Duloxetine Hcl] Other (See Comments)    Pt states it keeps her awake  . Adhesive [Tape] Other (See Comments)    Plastic tape rips skin and bruises her. PLEASE USE PAPER TAPE  . Lexapro [Escitalopram Oxalate] Other (See Comments)    Keeps awake for days.     The patient is critically ill with multiple organ systems failure and requires high complexity decision making for assessment and support, frequent evaluation and titration of therapies, application of advanced monitoring technologies and extensive interpretation of multiple databases. Critical Care Time devoted to patient care services described in this note independent of APP/resident time (if applicable)  is 32 minutes.   Sherrilyn Rist MD [facility] Personal pager: [phone] If unanswered, please page CCM On-call: [phone]

## 2019-06-25 NOTE — Progress Notes (Signed)
Husband called to update on patient status and plan of care, no questions at this time. Husband will come to pick up $3 and credit card tomorrow morning- sent to security.

## 2019-06-25 NOTE — Progress Notes (Addendum)
PROGRESS NOTE  Susan Houston  OFB:[id number] DOB: 1961-09-10 DOA: 07/13/2019 PCP: Valerie Roys, DO  Brief Narrative: Susan Houston is a 58 y.o. female with a history of 3L O2-dependent COPD, metastatic squamous cell lung CA 2019 s/p resection and recurrence 2020 undergoing chemotherapy and radiation, laryngeal CA s/p chemo and radiation 2009, depression, chronic pain who presented to the ED by EMS 9/2 with increasing shortness of breath and cough. She had a positive covid screen 9/1. In the ED, CXR demonstrated bilateral patchy opacities on chronic interstitial changes and scarring. She was requiring 6L O2. CRP elevated at 19 with normal procalcitonin. Steroids and remdesivir started and the patient was admitted to [facility]. Hemoglobin decreased in the setting of pancytopenia and 1u PRBCs given 9/3 with improvement. Despite precipitous decline in inflammatory markers, on 9/5 she demonstrated worsening hypoxia. CXR demonstrated progressive L > R airspace opacification. Convalescent plasma given and she was transferred to the ICU with PCCM consultation.  Assessment & Plan: Principal Problem:   Pneumonia due to COVID-19 virus Active Problems:   Chronic anxiety   COPD (chronic obstructive pulmonary disease) ([place])   Primary cancer of right lower lobe of lung (HCC) - metastatic squamous cell   Chronic pain syndrome   Long term current use of opiate analgesic   MDD (major depressive disorder), recurrent episode, moderate (HCC)   GAD (generalized anxiety disorder)   Acute on chronic respiratory failure with hypoxia (HCC) - on 2 L/min at night   Anemia of chronic disease  Acute on chronic hypoxemic respiratory failure due to covid-19 pneumonia: Felt to be at risk of progression to ARDS with recurrent lung cancer, COPD, frail functional status (takes 5 steps maximum prior to covid), undergoing chemotherapy. CXR overnight personally reviewed, displaying worrisome progressive airspace opacities, predominantly in  left lung.  - Continue IV steroids x10 days - Continue remdesivir (9/3 - 9/7) x5 days - Transferred to ICU 9/5, given convalescent plasma, placed on HHF.  - Check PCT. With progressive infiltrate, and rising WBC in the setting of clinical decompensation, there is concern for bacterial superinfection. Will start empiric broad spectrum antibiotics unless PCT not elevated. Also check MRSA PCR.   - Continue airborne, contact precautions. PPE including surgical gown, gloves, cap, shoe covers, and CAPR used during this encounter in a negative pressure room.  - Check daily labs: CBC w/diff, CMP, d-dimer, ferritin, CRP.   - Enoxaparin intermediate dose (pt w/elevated d-dimer, multiple thrombogenic risk factors including active malignancy and covid infection requiring ICU level of care) - Avoid NSAIDs - Use of tocilizumab not recommended in setting of chemotherapy.  - Goals of care were discussed with patient and husband. Prognosis is guarded. Full code, would want trial of intubation.  RLL metastatic squamous cell lung CA: Had initial resection July 2019, recurrence 2020. - Undergoing chemotherapy and radiation, supervised by Dr. Rogue Bussing who is aware of admission. First round was 2 weeks PTA.  COPD: Under the care of pulmonology, Dr. Mortimer Fries - Continue controller medications, combivent, prn albuterol MDI and steroids as above.   MDD, GAD:  - Continue SSRI, prn clonazepam, augmented dose with patient's evident anxiety worsening pulmonary effort.  Chronic pain, malignancy-associated pain:  - No evidence of sedation, changed percocet 5/325mg  (PTA med) to oxyIR 5-10mg  prn mod/severe pain and continue tylenol for alternative indications. No changes planned to outpatient regimen at discharge, i.e. no prescriptions will be provided.  - Continue lyrica.  Chemotherapy-induced pancytopenia, symptomatic anemia, and anemia of chronic disease: s/p 1u  PRBCs 9/3 with improvement as expected. Hgb stable. -  Continue to monitor CBC daily or prn bleeding. As hgb has remained stable, there is no active bleeding or hemodynamic instability to suggest occult bleeding, the benefit of prophylactic lovenox is felt to outweigh risk currently.  Obesity: - Dietitian consulted  Opioid-induced constipation:  - Change laxative to scheduled, BID, and add miralax.  DVT prophylaxis: Lovenox 40mg  q12h Code Status: Full Family Communication: Will call husband later today. Disposition Plan: Uncertain, guarded prognosis. continue ICU level of care.  Consultants:   PCCM, Dr. Ander Slade  Procedures:   Covid convalescent plasma 06/24/2019  Antimicrobials:  Remdesivir (9/3 - 9/7)  Subjective: Reports feeling about the same this morning in the ICU, though appears more comfortable. Coughing spells cause severe desaturations even on HHF. Currently on 30LPM, 70% FiO2 and 89-90% while sleeping. Had worsening hypoxemia last night but feels better now. LBM was 9/1. No abd pain.   Objective: Vitals:   06/25/19 0727 06/25/19 0740 06/25/19 0854 06/25/19 0855  BP:  115/79 128/70   Pulse: 93 89  (!) 101  Resp: (!) 21 20  (!) 22  Temp:      TempSrc:      SpO2: 90% 90%  (!) 81%  Weight:      Height:        Intake/Output Summary (Last 24 hours) at 06/25/2019 0948 Last data filed at 06/25/2019 0600 Gross per 24 hour  Intake 750 ml  Output 2600 ml  Net -1850 ml   Filed Weights   06/21/2019 0501 06/25/19 0120  Weight: 85.9 kg 87.7 kg   Gen: 58 y.o. female in no distress Pulm: Nonlabored breathing HHF. Coarse with inspiratory and expiratory wheezes/rhonchi. CV: Regular rate and rhythm. No murmur, rub, or gallop. No JVD, no dependent edema. GI: Abdomen soft, non-tender, non-distended, with normoactive bowel sounds.  Ext: Warm, no deformities Skin: No rashes, lesions or ulcers on visualized skin. Port site in left upper chest is c/d/i, accessed, nontender. Neuro: Alert and oriented. No focal neurological deficits.  Psych: Judgement and insight appear fair. Mood euthymic, not currently anxious, & affect congruent. Behavior is appropriate.    Data Reviewed: I have personally reviewed following labs and imaging studies  CBC: Recent Labs  Lab 06/21/19 1611 07/01/2019 0430 06/23/19 0000 06/24/19 0358 06/24/19 1344 06/25/19 0031 06/25/19 0435  WBC 2.2* 4.0 3.6* 3.9*  --   --  7.5  NEUTROABS 2.0 3.5 3.2 3.1  --   --  6.8  HGB 7.6* 7.2* 8.4* 9.0* 9.9* 9.5* 9.5*  HCT 21.8* 22.6* 25.8* 28.8* 29.0* 28.0* 29.6*  MCV 92.8 94.2 95.6 96.0  --   --  96.1  PLT 91* 87* 80* 92*  --   --  700*   Basic Metabolic Panel: Recent Labs  Lab 06/21/19 1611 07/01/2019 0430 06/23/19 0000 06/24/19 0358 06/24/19 1344 06/25/19 0031 06/25/19 0435  NA 138 139 140 139 136 138 138  K 3.4* 3.6 4.2 4.3 4.0 3.9 3.9  CL 100 100 103 99  --   --  92*  CO2 28 29 29 30   --   --  34*  GLUCOSE 223* 146* 131* 99  --   --  153*  BUN 10 14 15 19   --   --  20  CREATININE 0.68 0.62 0.69 0.63  --   --  0.62  CALCIUM 8.6* 8.8* 8.6* 8.8*  --   --  9.1   GFR: Estimated Creatinine Clearance: 77.8 mL/min (by  C-G formula based on SCr of 0.62 mg/dL). Liver Function Tests: Recent Labs  Lab 06/21/19 1611 06/29/2019 0430 06/23/19 0000 06/24/19 0358 06/25/19 0435  AST 16 12* 10* 11* 13*  ALT 11 10 10 10 12   ALKPHOS 80 76 78 80 84  BILITOT 0.6 0.5 0.5 0.5 0.5  PROT 6.6 6.6 6.5 6.8 7.3  ALBUMIN 2.7* 2.7* 2.7* 2.9* 3.1*   Anemia Panel: No results for input(s): VITAMINB12, FOLATE, FERRITIN, TIBC, IRON, RETICCTPCT in the last 72 hours. Urine analysis:    Component Value Date/Time   COLORURINE YELLOW (A) 03/03/2019 1503   APPEARANCEUR Clear 03/09/2019 1344   LABSPEC 1.010 03/03/2019 1503   LABSPEC 1.012 10/19/2013 1810   PHURINE 7.0 03/03/2019 1503   GLUCOSEU Negative 03/09/2019 1344   GLUCOSEU Negative 10/19/2013 1810   HGBUR LARGE (A) 03/03/2019 1503   BILIRUBINUR Negative 03/09/2019 1344   BILIRUBINUR Negative 10/19/2013 1810    KETONESUR NEGATIVE 03/03/2019 1503   PROTEINUR Negative 03/09/2019 1344   PROTEINUR NEGATIVE 03/03/2019 1503   UROBILINOGEN 1.0 09/07/2014 1157   NITRITE Negative 03/09/2019 1344   NITRITE NEGATIVE 03/03/2019 1503   LEUKOCYTESUR Trace (A) 03/09/2019 1344   LEUKOCYTESUR MODERATE (A) 03/03/2019 1503   LEUKOCYTESUR 1+ 10/19/2013 1810   Recent Results (from the past 240 hour(s))  SARS CORONAVIRUS 2 (TAT 6-24 HRS) Nasopharyngeal Nasopharyngeal Swab     Status: Abnormal   Collection Time: 06/20/19 11:37 AM   Specimen: Nasopharyngeal Swab  Result Value Ref Range Status   SARS Coronavirus 2 POSITIVE (A) NEGATIVE Final    Comment: (NOTE) SARS-CoV-2 target nucleic acids are DETECTED. The SARS-CoV-2 RNA is generally detectable in upper and lower respiratory specimens during the acute phase of infection. Positive results are indicative of active infection with SARS-CoV-2. Clinical  correlation with patient history and other diagnostic information is necessary to determine patient infection status. Positive results do  not rule out bacterial infection or co-infection with other viruses. The expected result is Negative. Fact Sheet for Patients: SugarRoll.be Fact Sheet for Healthcare Providers: https://www.woods-mathews.com/ This test is not yet approved or cleared by the [place] FDA and  has been authorized for detection and/or diagnosis of SARS-CoV-2 by FDA under an Emergency Use Authorization (EUA). This EUA will remain  in effect (meaning this test can be used) for the duration of the COVID-19 declaration under Section 564(b)(1) of the Act, 21 U.S.C.  section 360bbb-3(b)(1), unless the authorization is terminated or revoked sooner. Performed at [facility], [place] [address] [place], [place] [address]       Radiology Studies: Dg Chest Port 1 View  Result Date: 06/25/2019 CLINICAL DATA:  Hypoxemia EXAM: PORTABLE CHEST 1 VIEW  COMPARISON:  06/24/2019 FINDINGS: Left Port-A-Cath remains in place, unchanged. Diffuse airspace disease throughout the left lung, worsened since prior study. Right basilar opacities are stable. No acute bony abnormality. No effusions. IMPRESSION: Diffuse left lung airspace disease, slightly worsened since prior study. Appearance is suggestive of pneumonia. Right basilar scarring or infiltrate. Electronically Signed   By: Rolm Baptise M.D.   On: 06/25/2019 00:45   Dg Chest Port 1 View  Result Date: 06/24/2019 CLINICAL DATA:  Acute respiratory distress secondary to COVID-19 pneumonia. EXAM: PORTABLE CHEST 1 VIEW COMPARISON:  Single-view of the chest 06/21/2019 and PA and lateral chest 06/14/2019. CT chest 05/08/2019. FINDINGS: The lungs are emphysematous. Airspace disease throughout the left chest appears unchanged. Heart size is normal. Mild scar is seen in the right lung base where there is some  suture material. No pneumothorax or pleural fluid. Heart size is normal. Atherosclerosis. Port-A-Cath noted. IMPRESSION: No change in airspace disease throughout the left chest consistent with pneumonia. Emphysema. Electronically Signed   By: Inge Rise M.D.   On: 06/24/2019 11:51    Scheduled Meds: . sodium chloride   Intravenous Once  . aerochamber plus with mask  1 each Other Once  . aspirin EC  81 mg Oral Daily  . benzonatate  200 mg Oral TID  . Chlorhexidine Gluconate Cloth  6 each Topical Daily  . enoxaparin (LOVENOX) injection  40 mg Subcutaneous Q12H  . feeding supplement (ENSURE ENLIVE)  237 mL Oral TID BM  . FLUoxetine  80 mg Oral Daily  . Ipratropium-Albuterol  1 puff Inhalation Q6H  . methylPREDNISolone (SOLU-MEDROL) injection  80 mg Intravenous Q12H  . pantoprazole  40 mg Oral Daily  . pregabalin  75 mg Oral TID  . sucralfate  1 g Oral TID  . Suvorexant  20 mg Oral QHS   Continuous Infusions: . remdesivir 100 mg in NS 250 mL Stopped (06/24/19 2316)     LOS: 3 days   Time spent:  35 minutes.  Patrecia Pour, MD [facility] [email or web address] Password [facility] 06/25/2019, 9:48 AM

## 2019-06-25 NOTE — Progress Notes (Signed)
OT Cancellation Note  Patient Details Name: Susan Houston MRN: [id number] DOB: 11/22/1960   Cancelled Treatment:    Reason Eval/Treat Not Completed: Medical issues which prohibited therapy. Pt transferred to ICU 9/5. Now on 30L heated HFNC. Nsg states pt is movement intolerant at this time and asked to hold treatment today. Will assess for appropriateness tomorrow.   Fatiha Guzy,HILLARY 06/25/2019, 9:02 AM  Maurie Boettcher, OT/L   Acute OT Clinical Specialist Acute Rehabilitation Services Pager [phone] Office [phone]

## 2019-06-25 NOTE — Progress Notes (Addendum)
eLink Physician-Brief Progress Note Patient Name: Susan Houston DOB: December 04, 1960 MRN: [id number]   Date of Service  06/25/2019  HPI/Events of Note  Worsening hypoxemia. 58 year old lady was admitted 06/23/2019 with COVID-19 History of metastatic squamous cell lung cancer in 2019 status post resection recurrence in 2020 undergoing chemoradiation-has had 6 courses of chemotherapy, 30 radiation sessions. History of laryngeal cancer status post chemoradiation in 2009. COPD. Acute on chr type 2 failure. Pancytopenia.   Camera: Discussed with bed side RN and patient looking comfortable, awake, alert. On NRB, sats 86%. MAP ok. Hx of pneumo on rt in past.   eICU Interventions  Get stat CxR and ABG. If no Pneumo, to go on BiPAP with asp precautions.      Intervention Category Major Interventions: Respiratory failure - evaluation and management Minor Interventions: Communication with other healthcare providers and/or family  Elmer Sow 06/25/2019, 12:16 AM   00:55 CxR: no pneumo. Worsening basal atelectasis vs effusion. Left zone air space density stable.   Re Camera eval done.  She has more happy hypoxemia with stable mental status.P/F 69 has deteriorating.  On lovenox q12, d dimer 1.18, stable thrombocytopenia.   - stat Lasix 20  Mg IV ( MAP > 80). sats 86% on NRB mask. - to go on BiPAP if on neg suction room 12/8. Follow ABG after an hour. - avoid intubation as much as possible.  - follow d dimer, if going up, consider full dose Lovenox.

## 2019-06-25 NOTE — Progress Notes (Signed)
Pt on heated high flow cannula at this time and is tolerating well. Sats in the high 90's on 30L and 100% Pt continuing to have coughing spells where she can't catch her breath or stop coughing which drops pt's sats but pt recovers after coughing stops. No distress noted at this time. RT to continue to monitor as needed. Pt to be placed on BiPAP if L-flow/O2 demand increases.

## 2019-06-26 DIAGNOSIS — J9621 Acute and chronic respiratory failure with hypoxia: Secondary | ICD-10-CM

## 2019-06-26 LAB — COMPREHENSIVE METABOLIC PANEL
ALT: 12 U/L (ref 0–44)
AST: 12 U/L — ABNORMAL LOW (ref 15–41)
Albumin: 2.7 g/dL — ABNORMAL LOW (ref 3.5–5.0)
Alkaline Phosphatase: 78 U/L (ref 38–126)
Anion gap: 8 (ref 5–15)
BUN: 23 mg/dL — ABNORMAL HIGH (ref 6–20)
CO2: 30 mmol/L (ref 22–32)
Calcium: 8.3 mg/dL — ABNORMAL LOW (ref 8.9–10.3)
Chloride: 99 mmol/L (ref 98–111)
Creatinine, Ser: 0.57 mg/dL (ref 0.44–1.00)
GFR calc Af Amer: >60 mL/min (ref >60)
GFR calc non Af Amer: >60 mL/min (ref >60)
Glucose, Bld: 126 mg/dL — ABNORMAL HIGH (ref 70–99)
Potassium: 4.5 mmol/L (ref 3.5–5.1)
Sodium: 137 mmol/L (ref 135–145)
Total Bilirubin: 0.5 mg/dL (ref 0.3–1.2)
Total Protein: 6.5 g/dL (ref 6.5–8.1)

## 2019-06-26 LAB — CBC WITH DIFFERENTIAL/PLATELET
Abs Immature Granulocytes: 0.1 10*3/uL — ABNORMAL HIGH (ref 0.00–0.07)
Basophils Absolute: 0 10*3/uL (ref 0.0–0.1)
Basophils Relative: 0 %
Eosinophils Absolute: 0 10*3/uL (ref 0.0–0.5)
Eosinophils Relative: 0 %
HCT: 27.3 % — ABNORMAL LOW (ref 36.0–46.0)
Hemoglobin: 8.6 g/dL — ABNORMAL LOW (ref 12.0–15.0)
Immature Granulocytes: 1 %
Lymphocytes Relative: 3 %
Lymphs Abs: 0.2 10*3/uL — ABNORMAL LOW (ref 0.7–4.0)
MCH: 30.6 pg (ref 26.0–34.0)
MCHC: 31.5 g/dL (ref 30.0–36.0)
MCV: 97.2 fL (ref 80.0–100.0)
Monocytes Absolute: 0.3 10*3/uL (ref 0.1–1.0)
Monocytes Relative: 5 %
Neutro Abs: 7 10*3/uL (ref 1.7–7.7)
Neutrophils Relative %: 91 %
Platelets: 124 10*3/uL — ABNORMAL LOW (ref 150–400)
RBC: 2.81 MIL/uL — ABNORMAL LOW (ref 3.87–5.11)
RDW: 20.1 % — ABNORMAL HIGH (ref 11.5–15.5)
WBC: 7.6 10*3/uL (ref 4.0–10.5)
nRBC: 0 % (ref 0.0–0.2)

## 2019-06-26 LAB — C-REACTIVE PROTEIN: CRP: 6.1 mg/dL — ABNORMAL HIGH (ref <1.0)

## 2019-06-26 LAB — PROCALCITONIN: Procalcitonin: <0.1 ng/mL

## 2019-06-26 LAB — D-DIMER, QUANTITATIVE: D-Dimer, Quant: 0.98 ug/mL-FEU — ABNORMAL HIGH (ref 0.00–0.50)

## 2019-06-26 MED ORDER — OXYCODONE HCL 5 MG PO TABS
10.0000 mg | ORAL_TABLET | Freq: Once | ORAL | Status: AC
  Administered 2019-06-26: 10 mg via ORAL
  Filled 2019-06-26: qty 2

## 2019-06-26 MED ORDER — SENNOSIDES-DOCUSATE SODIUM 8.6-50 MG PO TABS
2.0000 | ORAL_TABLET | Freq: Two times a day (BID) | ORAL | Status: DC
  Administered 2019-06-26 – 2019-06-30 (×6): 2 via ORAL
  Filled 2019-06-26 (×8): qty 2

## 2019-06-26 MED ORDER — MORPHINE SULFATE (PF) 2 MG/ML IV SOLN
1.0000 mg | INTRAVENOUS | Status: DC | PRN
  Administered 2019-06-26 – 2019-06-29 (×9): 1 mg via INTRAVENOUS
  Filled 2019-06-26 (×9): qty 1

## 2019-06-26 NOTE — Progress Notes (Signed)
PROGRESS NOTE  Susan Houston  RXV:[id number] DOB: [birth year] DOA: 07/09/2019 PCP: Valerie Roys, DO  Brief Narrative: Susan Houston is a 58 y.o. female with a history of 3L O2-dependent COPD, metastatic squamous cell lung CA 2019 s/p resection and recurrence 2020 undergoing chemotherapy and radiation, laryngeal CA s/p chemo and radiation 2009, depression, chronic pain who presented to the ED by EMS 9/2 with increasing shortness of breath and cough. She had a positive covid screen 9/1. In the ED, CXR demonstrated bilateral patchy opacities on chronic interstitial changes and scarring. She was requiring 6L O2. CRP elevated at 19 with normal procalcitonin. Steroids and remdesivir started and the patient was admitted to [facility]. Hemoglobin decreased in the setting of pancytopenia and 1u PRBCs given 9/3 with improvement. Despite precipitous decline in inflammatory markers, on 9/5 she demonstrated worsening hypoxia. CXR demonstrated progressive L > R airspace opacification. Convalescent plasma given and she was transferred to the ICU with PCCM consultation.  Assessment & Plan: Principal Problem:   Pneumonia due to COVID-19 virus Active Problems:   Chronic anxiety   COPD (chronic obstructive pulmonary disease) ([place])   Primary cancer of right lower lobe of lung (HCC) - metastatic squamous cell   Chronic pain syndrome   Long term current use of opiate analgesic   MDD (major depressive disorder), recurrent episode, moderate (HCC)   GAD (generalized anxiety disorder)   Acute on chronic respiratory failure with hypoxia (HCC) - on 2 L/min at night   Anemia of chronic disease  Acute on chronic hypoxemic respiratory failure due to covid-19 pneumonia: Felt to be at risk of progression to ARDS with recurrent lung cancer, COPD, frail functional status (takes 5 steps maximum prior to covid), undergoing chemotherapy. CXR overnight personally reviewed, displaying worrisome progressive airspace opacities, predominantly in  left lung.  - Continue IV steroids x10 days - Completed remdesivir (9/3 - 9/7) x5 days - Convalescent plasma given 9/5.  - Transferred to ICU 9/5, started HHF. Wean as tolerated.  - PCT reassuringly undetectable, though progressive CXR infiltrate and productive sputum. Check sputum culture if able. - Continue airborne, contact precautions. PPE including surgical gown, gloves, cap, shoe covers, and CAPR used during this encounter in a negative pressure room.  - Check daily labs: CBC w/diff, CMP, d-dimer, ferritin, CRP.   - Enoxaparin intermediate dose (pt w/elevated d-dimer, multiple thrombogenic risk factors including active malignancy and covid infection requiring ICU level of care) - Avoid NSAIDs - Use of tocilizumab not recommended in setting of chemotherapy.  - Goals of care were discussed with patient and husband. Prognosis is guarded. Full code, would want trial of intubation.  RLL metastatic squamous cell lung CA: Had initial resection July 2019, recurrence 2020. - Undergoing chemotherapy and radiation, supervised by Dr. Rogue Bussing who is aware of admission. First round was 2 weeks PTA.  COPD: Under the care of pulmonology, Dr. Mortimer Fries - Continue controller medications, combivent, prn albuterol MDI and steroids as above.   MDD, GAD:  - Continue SSRI, prn clonazepam, augmented dose with patient's evident anxiety worsening pulmonary effort.  Chronic pain, malignancy-associated pain:  - No evidence of sedation, changed percocet 5/325mg  (PTA med) to oxyIR 5-10mg  prn mod/severe pain and continue tylenol for alternative indications. No changes planned to outpatient regimen at discharge, i.e. no prescriptions will be provided.  - Continue lyrica.  Chemotherapy-induced pancytopenia, symptomatic anemia, and anemia of chronic disease: s/p 1u PRBCs 9/3 with improvement as expected. Hgb stable. - Continue to monitor CBC daily or prn  bleeding. As hgb has remained stable, there is no active  bleeding or hemodynamic instability to suggest occult bleeding, the benefit of prophylactic lovenox is felt to outweigh risk currently.  Obesity: - Dietitian consulted  Opioid-induced constipation: Not unusual to go1-2 weeks without BM. LBM 9/1. - Changed laxative to scheduled BID, will increase to 2 tabs, added miralax.  DVT prophylaxis: Lovenox 40mg  q12h Code Status: Full Family Communication: Husband by phone daily Disposition Plan: Uncertain, guarded prognosis. continue ICU level of care. PT/OT will be important if she experiences clinical improvement.  Consultants:   PCCM   Procedures:   Covid convalescent plasma 06/24/2019  Antimicrobials:  Remdesivir (9/3 - 9/7)  Subjective: Generally slightly better this morning. Pain well controlled, anxiety well controlled but provoked by coughing spells which continue to cause severe desaturations. In between, however, she's able to wean down on oxygen over past 24 hours. No chest pain or fevers.  Objective: Vitals:   06/26/19 0500 06/26/19 0600 06/26/19 0700 06/26/19 0740  BP: 111/61 124/77 119/77   Pulse: 75 82 76   Resp: (!) 21 15 17    Temp:      TempSrc:      SpO2: 98% 99% 97% 96%  Weight:      Height:        Intake/Output Summary (Last 24 hours) at 06/26/2019 0809 Last data filed at 06/26/2019 0700 Gross per 24 hour  Intake 150 ml  Output 1300 ml  Net -1150 ml   Filed Weights   06/30/2019 0501 06/25/19 0120  Weight: 85.9 kg 87.7 kg   Gen: Chronically ill-appearing female in no distress Pulm: Tachypneic, 90% w/HHFNC. Coarse bilaterally. CV: Regular rate and rhythm. No murmur, rub, or gallop. No JVD, no dependent edema. GI: Abdomen soft, non-tender, non-distended, with normoactive bowel sounds.  Ext: Warm, no deformities Skin: No rashes, lesions or ulcers on visualized skin. Neuro: Alert and oriented. No focal neurological deficits. Psych: Judgement and insight appear fair. Mood euthymic & affect congruent. Behavior is  appropriate.  Data Reviewed: I have personally reviewed following labs and imaging studies  CBC: Recent Labs  Lab 07/06/2019 0430 06/23/19 0000 06/24/19 0358 06/24/19 1344 06/25/19 0031 06/25/19 0435 06/26/19 0450  WBC 4.0 3.6* 3.9*  --   --  7.5 7.6  NEUTROABS 3.5 3.2 3.1  --   --  6.8 7.0  HGB 7.2* 8.4* 9.0* 9.9* 9.5* 9.5* 8.6*  HCT 22.6* 25.8* 28.8* 29.0* 28.0* 29.6* 27.3*  MCV 94.2 95.6 96.0  --   --  96.1 97.2  PLT 87* 80* 92*  --   --  120* 540*   Basic Metabolic Panel: Recent Labs  Lab 06/24/2019 0430 06/23/19 0000 06/24/19 0358 06/24/19 1344 06/25/19 0031 06/25/19 0435 06/26/19 0450  NA 139 140 139 136 138 138 137  K 3.6 4.2 4.3 4.0 3.9 3.9 4.5  CL 100 103 99  --   --  92* 99  CO2 29 29 30   --   --  34* 30  GLUCOSE 146* 131* 99  --   --  153* 126*  BUN 14 15 19   --   --  20 23*  CREATININE 0.62 0.69 0.63  --   --  0.62 0.57  CALCIUM 8.8* 8.6* 8.8*  --   --  9.1 8.3*   GFR: Estimated Creatinine Clearance: 77.8 mL/min (by C-G formula based on SCr of 0.57 mg/dL). Liver Function Tests: Recent Labs  Lab 07/14/2019 0430 06/23/19 0000 06/24/19 9811 06/25/19 0435 06/26/19 9147  AST 12* 10* 11* 13* 12*  ALT 10 10 10 12 12   ALKPHOS 76 78 80 84 78  BILITOT 0.5 0.5 0.5 0.5 0.5  PROT 6.6 6.5 6.8 7.3 6.5  ALBUMIN 2.7* 2.7* 2.9* 3.1* 2.7*   Anemia Panel: No results for input(s): VITAMINB12, FOLATE, FERRITIN, TIBC, IRON, RETICCTPCT in the last 72 hours. Urine analysis:    Component Value Date/Time   COLORURINE YELLOW (A) 03/03/2019 1503   APPEARANCEUR Clear 03/09/2019 1344   LABSPEC 1.010 03/03/2019 1503   LABSPEC 1.012 10/19/2013 1810   PHURINE 7.0 03/03/2019 1503   GLUCOSEU Negative 03/09/2019 1344   GLUCOSEU Negative 10/19/2013 1810   HGBUR LARGE (A) 03/03/2019 1503   BILIRUBINUR Negative 03/09/2019 1344   BILIRUBINUR Negative 10/19/2013 1810   KETONESUR NEGATIVE 03/03/2019 1503   PROTEINUR Negative 03/09/2019 1344   PROTEINUR NEGATIVE 03/03/2019 1503    UROBILINOGEN 1.0 09/07/2014 1157   NITRITE Negative 03/09/2019 1344   NITRITE NEGATIVE 03/03/2019 1503   LEUKOCYTESUR Trace (A) 03/09/2019 1344   LEUKOCYTESUR MODERATE (A) 03/03/2019 1503   LEUKOCYTESUR 1+ 10/19/2013 1810   Recent Results (from the past 240 hour(s))  SARS CORONAVIRUS 2 (TAT 6-24 HRS) Nasopharyngeal Nasopharyngeal Swab     Status: Abnormal   Collection Time: 06/20/19 11:37 AM   Specimen: Nasopharyngeal Swab  Result Value Ref Range Status   SARS Coronavirus 2 POSITIVE (A) NEGATIVE Final    Comment: (NOTE) SARS-CoV-2 target nucleic acids are DETECTED. The SARS-CoV-2 RNA is generally detectable in upper and lower respiratory specimens during the acute phase of infection. Positive results are indicative of active infection with SARS-CoV-2. Clinical  correlation with patient history and other diagnostic information is necessary to determine patient infection status. Positive results do  not rule out bacterial infection or co-infection with other viruses. The expected result is Negative. Fact Sheet for Patients: SugarRoll.be Fact Sheet for Healthcare Providers: https://www.woods-mathews.com/ This test is not yet approved or cleared by the [place] FDA and  has been authorized for detection and/or diagnosis of SARS-CoV-2 by FDA under an Emergency Use Authorization (EUA). This EUA will remain  in effect (meaning this test can be used) for the duration of the COVID-19 declaration under Section 564(b)(1) of the Act, 21 U.S.C.  section 360bbb-3(b)(1), unless the authorization is terminated or revoked sooner. Performed at [facility], [place] [address], [place], [place] [address]   MRSA PCR Screening     Status: None   Collection Time: 06/25/19  9:58 AM   Specimen: Nasal Mucosa; Nasopharyngeal  Result Value Ref Range Status   MRSA by PCR NEGATIVE NEGATIVE Final    Comment:        The GeneXpert MRSA Assay (FDA approved  for NASAL specimens only), is one component of a comprehensive MRSA colonization surveillance program. It is not intended to diagnose MRSA infection nor to guide or monitor treatment for MRSA infections. Performed at [facility], [place] [address], [place], [place] [address]       Radiology Studies: Dg Chest Port 1 View  Result Date: 06/25/2019 CLINICAL DATA:  Hypoxemia EXAM: PORTABLE CHEST 1 VIEW COMPARISON:  06/24/2019 FINDINGS: Left Port-A-Cath remains in place, unchanged. Diffuse airspace disease throughout the left lung, worsened since prior study. Right basilar opacities are stable. No acute bony abnormality. No effusions. IMPRESSION: Diffuse left lung airspace disease, slightly worsened since prior study. Appearance is suggestive of pneumonia. Right basilar scarring or infiltrate. Electronically Signed   By: Rolm Baptise M.D.   On:  06/25/2019 00:45   Dg Chest Port 1 View  Result Date: 06/24/2019 CLINICAL DATA:  Acute respiratory distress secondary to COVID-19 pneumonia. EXAM: PORTABLE CHEST 1 VIEW COMPARISON:  Single-view of the chest 06/21/2019 and PA and lateral chest 06/14/2019. CT chest 05/08/2019. FINDINGS: The lungs are emphysematous. Airspace disease throughout the left chest appears unchanged. Heart size is normal. Mild scar is seen in the right lung base where there is some suture material. No pneumothorax or pleural fluid. Heart size is normal. Atherosclerosis. Port-A-Cath noted. IMPRESSION: No change in airspace disease throughout the left chest consistent with pneumonia. Emphysema. Electronically Signed   By: Inge Rise M.D.   On: 06/24/2019 11:51    Scheduled Meds: . sodium chloride   Intravenous Once  . aerochamber plus with mask  1 each Other Once  . aspirin EC  81 mg Oral Daily  . benzonatate  200 mg Oral TID  . Chlorhexidine Gluconate Cloth  6 each Topical Daily  . enoxaparin (LOVENOX) injection  40 mg Subcutaneous Q12H  . feeding supplement  (ENSURE ENLIVE)  237 mL Oral TID BM  . FLUoxetine  80 mg Oral Daily  . Ipratropium-Albuterol  1 puff Inhalation Q6H  . methylPREDNISolone (SOLU-MEDROL) injection  80 mg Intravenous Q12H  . pantoprazole  40 mg Oral Daily  . polyethylene glycol  17 g Oral Daily  . pregabalin  75 mg Oral TID  . senna-docusate  1 tablet Oral BID  . sucralfate  1 g Oral TID  . Suvorexant  20 mg Oral QHS   Continuous Infusions:    LOS: 4 days   Time spent: 35 minutes.  Patrecia Pour, MD [facility] [email or web address] Password [email or web address] 06/26/2019, 8:09 AM

## 2019-06-26 NOTE — Progress Notes (Signed)
OT Cancellation Note  Patient Details Name: Susan Houston MRN: [id number] DOB: 1961/03/13   Cancelled Treatment:    Reason Eval/Treat Not Completed: Patient not medically ready. Discussed with nursing who states pt desats significantly with movement and coughing. Will check on pt later today.   Tiajuana Leppanen,HILLARY 06/26/2019, 8:53 AM  Maurie Boettcher, OT/L   Acute OT Clinical Specialist Acute Rehabilitation Services Pager [phone] Office [phone]

## 2019-06-26 NOTE — Progress Notes (Signed)
NAME:  Susan Houston, MRN:  [id number], DOB:  06-06-1961, LOS: 4 ADMISSION DATE:  07/18/2019, CONSULTATION DATE: 06/24/2019 REFERRING MD: Dr. Vance Gather, CHIEF COMPLAINT: Respiratory failure, sepsis COVID-19 pneumonia  Brief History   58 year old lady was admitted 06/23/2019 with COVID-19 History of metastatic squamous cell lung cancer in 2019 status post resection recurrence in 2020 undergoing chemoradiation-has had 6 courses of chemotherapy, 30 radiation sessions. History of laryngeal cancer status post chemoradiation in 2009 Presented with worsening shortness of breath Transferred from  regional ER for COVID infection Decompensated this morning with increasing oxygen requirement, now 100% Transferred to the ICU   History of present illness   She is limited at baseline She has chronic respiratory failure on home oxygen at night Chronic pain syndrome,Anxiety and depression, only able to take about 5 steps at baseline, while undergoing testing for a colonoscopy was found to be positive for COVID, admitted to being short of breath and presented to the hospital for the same She does have productive, has had chills,  No fever.  She felt her symptoms were related to her ongoing cancer Last chemo session was 2 weeks ago, for shortness of breath in the last week-was on steroids  Significant Hospital Events   Decompensation on 06/24/2019-worsening oxygen requirement Transfer to the ICU 06/24/2019 Received plasma 06/24/2019  Consults:  PCCM 06/24/2019  Procedures:  None  Significant Diagnostic Tests:  Chest x-ray bilateral infiltrate worse on the left, progressive on the left X-ray that was performed during the night did reveal worsening process with hypoventilation-reviewed by myself Micro Data:  SIRS coronavirus testing +06/20/2019  Antimicrobials:  Remdesivir 9/3>> 9/5  Interim history/subjective:  Improved oxygenation over the last 24 hours Completed remdesivir, remains on Solu-Medrol  She has cough that is intermittently productive of green/brown sputum Chest x-ray 9/6 reviewed   Objective   Blood pressure 119/77, pulse 76, temperature 98.3 F (36.8 C), temperature source Oral, resp. rate 17, height 5\' 2"  (1.575 m), weight 87.7 kg, SpO2 96 %.    FiO2 (%):  [70 %-100 %] 70 %   Intake/Output Summary (Last 24 hours) at 06/26/2019 1021 Last data filed at 06/26/2019 0700 Gross per 24 hour  Intake 150 ml  Output 1300 ml  Net -1150 ml   Filed Weights   07/08/2019 0501 06/25/19 0120  Weight: 85.9 kg 87.7 kg    Examination: General: Chronically ill-appearing but in no current distress HENT: Oropharynx moist, pupils equal, react Lungs: Bilateral inspiratory crackles, more so on the left Cardiovascular: Regular, no murmur, distant Abdomen: Soft, obese, nondistended with positive bowel sounds Extremities: No clubbing, no edema Neuro: Awake, alert, appropriate, good strength, follows commands  Resolved Hospital Problem list     Assessment & Plan:  Coronavirus infection Pneumonia due to COVID infection -Remdesivir completed -Received convalescent plasma -Currently on Solu-Medrol course -Wean oxygen as able -Prone positioning as she can tolerate -Continue scheduled enoxaparin anticoagulation -Deferring antibiotics at this point but if any evidence of decompensation would initiate.  Check respiratory culture if she can produce -follow CXR   Acute on chronic respiratory failure -Wean FiO2 as able, tolerating high flow [place] -Bronchodilators as ordered  Chronic obstructive pulmonary disease -Bronchodilators -Steroids  Metastatic lung cancer -Post wedge resection right lower lobe -On chemotherapy, last course was 2 weeks ago, did receive radiation treatments  Deconditioning -Poor baseline status.  Push PT-OT when she is able to tolerate  Chronic pain -Oxycodone IR ordered as needed  Pancytopenia -Related to chemotherapy -Follow CBC  Depression-anxiety  -  Lyrica, Prozac -Clonazepam  Recurrent cough -Tessalon ordered    Best practice:  Diet: Regular Pain/Anxiety/Delirium protocol (if indicated): Opiates VAP protocol (if indicated): Not indicated DVT prophylaxis: On anticoagulation GI prophylaxis: Protonix Mobility: Bedrest Code Status: Full code Family Communication: Discussed in full with the patient 9/7 Disposition: Remains in ICU  Labs   CBC: Recent Labs  Lab 07/13/2019 0430 06/23/19 0000 06/24/19 0358 06/24/19 1344 06/25/19 0031 06/25/19 0435 06/26/19 0450  WBC 4.0 3.6* 3.9*  --   --  7.5 7.6  NEUTROABS 3.5 3.2 3.1  --   --  6.8 7.0  HGB 7.2* 8.4* 9.0* 9.9* 9.5* 9.5* 8.6*  HCT 22.6* 25.8* 28.8* 29.0* 28.0* 29.6* 27.3*  MCV 94.2 95.6 96.0  --   --  96.1 97.2  PLT 87* 80* 92*  --   --  120* 124*    Basic Metabolic Panel: Recent Labs  Lab 07/03/2019 0430 06/23/19 0000 06/24/19 0358 06/24/19 1344 06/25/19 0031 06/25/19 0435 06/26/19 0450  NA 139 140 139 136 138 138 137  K 3.6 4.2 4.3 4.0 3.9 3.9 4.5  CL 100 103 99  --   --  92* 99  CO2 29 29 30   --   --  34* 30  GLUCOSE 146* 131* 99  --   --  153* 126*  BUN 14 15 19   --   --  20 23*  CREATININE 0.62 0.69 0.63  --   --  0.62 0.57  CALCIUM 8.8* 8.6* 8.8*  --   --  9.1 8.3*   GFR: Estimated Creatinine Clearance: 77.8 mL/min (by C-G formula based on SCr of 0.57 mg/dL). Recent Labs  Lab 06/21/19 1611  06/23/19 0000 06/24/19 0358 06/25/19 0435 06/26/19 0450  PROCALCITON <0.10  --   --   --  <0.10 <0.10  WBC 2.2*   < > 3.6* 3.9* 7.5 7.6   < > = values in this interval not displayed.    Liver Function Tests: Recent Labs  Lab 07/10/2019 0430 06/23/19 0000 06/24/19 0358 06/25/19 0435 06/26/19 0450  AST 12* 10* 11* 13* 12*  ALT 10 10 10 12 12   ALKPHOS 76 78 80 84 78  BILITOT 0.5 0.5 0.5 0.5 0.5  PROT 6.6 6.5 6.8 7.3 6.5  ALBUMIN 2.7* 2.7* 2.9* 3.1* 2.7*   No results for input(s): LIPASE, AMYLASE in the last 168 hours. No results for input(s):  AMMONIA in the last 168 hours.  ABG    Component Value Date/Time   PHART 7.434 06/25/2019 0031   PCO2ART 44.8 06/25/2019 0031   PO2ART 61.0 (L) 06/25/2019 0031   HCO3 30.2 (H) 06/25/2019 0031   TCO2 32 06/25/2019 0031   O2SAT 92.0 06/25/2019 0031     Independent critical care time 31 minutes  Baltazar Apo, MD, PhD 06/26/2019, 10:31 AM [facility] [phone] or if no answer [phone]

## 2019-06-26 NOTE — Plan of Care (Signed)
Ms. Klinck is a very kind lady and is resting comfortably in bed.  No acute events overnight.  Still having coughing fits intermittently with subsequent desaturation events with Spo2 in the high 70s.  Guaifenesin with oxycodone seems to help her a lot.  See flowsheet for assessment details and MAR for medication administration. Will continue to monitor.     Problem: Education: Goal: Knowledge of General Education information will improve Description: Including pain rating scale, medication(s)/side effects and non-pharmacologic comfort measures 06/26/2019 0739 by Aundria Rud, RN Outcome: Progressing 06/26/2019 0738 by Aundria Rud, RN Outcome: Progressing   Problem: Health Behavior/Discharge Planning: Goal: Ability to manage health-related needs will improve 06/26/2019 0739 by Aundria Rud, RN Outcome: Progressing 06/26/2019 0738 by Aundria Rud, RN Outcome: Not Progressing   Problem: Clinical Measurements: Goal: Ability to maintain clinical measurements within normal limits will improve 06/26/2019 0739 by Aundria Rud, RN Outcome: Progressing 06/26/2019 0738 by Aundria Rud, RN Outcome: Progressing Goal: Will remain free from infection 06/26/2019 0739 by Aundria Rud, RN Outcome: Progressing 06/26/2019 0738 by Aundria Rud, RN Outcome: Progressing Goal: Diagnostic test results will improve 06/26/2019 0739 by Aundria Rud, RN Outcome: Progressing 06/26/2019 0738 by Aundria Rud, RN Outcome: Progressing Goal: Cardiovascular complication will be avoided 06/26/2019 0739 by Aundria Rud, RN Outcome: Progressing 06/26/2019 0738 by Aundria Rud, RN Outcome: Progressing   Problem: Nutrition: Goal: Adequate nutrition will be maintained 06/26/2019 0739 by Aundria Rud, RN Outcome: Progressing 06/26/2019 0738 by Aundria Rud, RN Outcome: Not Progressing   Problem: Elimination: Goal: Will not experience complications related to bowel motility 06/26/2019 0739 by Aundria Rud,  RN Outcome: Progressing 06/26/2019 0738 by Aundria Rud, RN Outcome: Progressing Goal: Will not experience complications related to urinary retention 06/26/2019 0739 by Aundria Rud, RN Outcome: Progressing 06/26/2019 0738 by Aundria Rud, RN Outcome: Progressing   Problem: Pain Managment: Goal: General experience of comfort will improve 06/26/2019 0739 by Aundria Rud, RN Outcome: Progressing 06/26/2019 0738 by Aundria Rud, RN Outcome: Progressing   Problem: Safety: Goal: Ability to remain free from injury will improve 06/26/2019 0739 by Aundria Rud, RN Outcome: Progressing 06/26/2019 0738 by Aundria Rud, RN Outcome: Progressing   Problem: Education: Goal: Knowledge of risk factors and measures for prevention of condition will improve 06/26/2019 0739 by Aundria Rud, RN Outcome: Progressing 06/26/2019 0738 by Aundria Rud, RN Outcome: Progressing   Problem: Coping: Goal: Psychosocial and spiritual needs will be supported 06/26/2019 0739 by Aundria Rud, RN Outcome: Progressing 06/26/2019 0738 by Aundria Rud, RN Outcome: Progressing   Problem: Respiratory: Goal: Will maintain a patent airway 06/26/2019 0739 by Aundria Rud, RN Outcome: Progressing 06/26/2019 0738 by Aundria Rud, RN Outcome: Progressing Goal: Complications related to the disease process, condition or treatment will be avoided or minimized 06/26/2019 0739 by Aundria Rud, RN Outcome: Progressing 06/26/2019 0738 by Aundria Rud, RN Outcome: Progressing   Problem: Clinical Measurements: Goal: Respiratory complications will improve 06/26/2019 0739 by Aundria Rud, RN Outcome: Not Progressing 06/26/2019 0738 by Aundria Rud, RN Outcome: Not Progressing   Problem: Activity: Goal: Risk for activity intolerance will decrease 06/26/2019 0739 by Aundria Rud, RN Outcome: Not Progressing 06/26/2019 0738 by Aundria Rud, RN Outcome: Not Progressing   Problem: Coping: Goal: Level of anxiety  will decrease 06/26/2019 0739 by Aundria Rud, RN Outcome: Not Progressing 06/26/2019 [phone]  by Aundria Rud, RN Outcome: Progressing   Problem: Skin Integrity: Goal: Risk for impaired skin integrity will decrease 06/26/2019 0739 by Aundria Rud, RN Outcome: Not Progressing 06/26/2019 0738 by Aundria Rud, RN Outcome: Not Progressing

## 2019-06-26 NOTE — Progress Notes (Signed)
Called patient's husband, Katrina Stack and gave him updates on patient. Earnest appreciated the updates and stated that he had been talking to patient during the day. I answered all questions and concerns he had regarding patient's condition and care.  I told him to call if he had further questions regarding patient. Katrina Stack stated that he would like to get updates on patient this evening.

## 2019-06-26 NOTE — Progress Notes (Signed)
Called and updated patient's spouse Susan Houston denies having questions at this time, and is very appreciative of the care being provided to his wife.

## 2019-06-26 NOTE — Plan of Care (Signed)
Progressing on all goals at this time.

## 2019-06-27 ENCOUNTER — Ambulatory Visit: Admission: RE | Admit: 2019-06-27 | Payer: Medicare Other | Source: Ambulatory Visit

## 2019-06-27 ENCOUNTER — Other Ambulatory Visit: Payer: Self-pay

## 2019-06-27 ENCOUNTER — Inpatient Hospital Stay (HOSPITAL_COMMUNITY): Payer: Medicare Other

## 2019-06-27 ENCOUNTER — Telehealth: Payer: Self-pay | Admitting: Internal Medicine

## 2019-06-27 DIAGNOSIS — J9601 Acute respiratory failure with hypoxia: Secondary | ICD-10-CM

## 2019-06-27 DIAGNOSIS — J069 Acute upper respiratory infection, unspecified: Secondary | ICD-10-CM

## 2019-06-27 LAB — CBC WITH DIFFERENTIAL/PLATELET
Abs Immature Granulocytes: 0.22 10*3/uL — ABNORMAL HIGH (ref 0.00–0.07)
Basophils Absolute: 0 10*3/uL (ref 0.0–0.1)
Basophils Relative: 0 %
Eosinophils Absolute: 0 10*3/uL (ref 0.0–0.5)
Eosinophils Relative: 0 %
HCT: 30.8 % — ABNORMAL LOW (ref 36.0–46.0)
Hemoglobin: 9.6 g/dL — ABNORMAL LOW (ref 12.0–15.0)
Immature Granulocytes: 3 %
Lymphocytes Relative: 2 %
Lymphs Abs: 0.2 10*3/uL — ABNORMAL LOW (ref 0.7–4.0)
MCH: 30.2 pg (ref 26.0–34.0)
MCHC: 31.2 g/dL (ref 30.0–36.0)
MCV: 96.9 fL (ref 80.0–100.0)
Monocytes Absolute: 0.3 10*3/uL (ref 0.1–1.0)
Monocytes Relative: 3 %
Neutro Abs: 8.1 10*3/uL — ABNORMAL HIGH (ref 1.7–7.7)
Neutrophils Relative %: 92 %
Platelets: 146 10*3/uL — ABNORMAL LOW (ref 150–400)
RBC: 3.18 MIL/uL — ABNORMAL LOW (ref 3.87–5.11)
RDW: 19.9 % — ABNORMAL HIGH (ref 11.5–15.5)
WBC: 8.8 10*3/uL (ref 4.0–10.5)
nRBC: 0 % (ref 0.0–0.2)

## 2019-06-27 LAB — COMPREHENSIVE METABOLIC PANEL
ALT: 13 U/L (ref 0–44)
AST: 13 U/L — ABNORMAL LOW (ref 15–41)
Albumin: 3 g/dL — ABNORMAL LOW (ref 3.5–5.0)
Alkaline Phosphatase: 92 U/L (ref 38–126)
Anion gap: 8 (ref 5–15)
BUN: 22 mg/dL — ABNORMAL HIGH (ref 6–20)
CO2: 30 mmol/L (ref 22–32)
Calcium: 8.5 mg/dL — ABNORMAL LOW (ref 8.9–10.3)
Chloride: 98 mmol/L (ref 98–111)
Creatinine, Ser: 0.63 mg/dL (ref 0.44–1.00)
GFR calc Af Amer: >60 mL/min (ref >60)
GFR calc non Af Amer: >60 mL/min (ref >60)
Glucose, Bld: 138 mg/dL — ABNORMAL HIGH (ref 70–99)
Potassium: 4.3 mmol/L (ref 3.5–5.1)
Sodium: 136 mmol/L (ref 135–145)
Total Bilirubin: 0.7 mg/dL (ref 0.3–1.2)
Total Protein: 6.7 g/dL (ref 6.5–8.1)

## 2019-06-27 LAB — EXPECTORATED SPUTUM ASSESSMENT W GRAM STAIN, RFLX TO RESP C

## 2019-06-27 LAB — C-REACTIVE PROTEIN: CRP: 3.5 mg/dL — ABNORMAL HIGH (ref <1.0)

## 2019-06-27 LAB — PROCALCITONIN: Procalcitonin: <0.1 ng/mL

## 2019-06-27 LAB — D-DIMER, QUANTITATIVE: D-Dimer, Quant: 1.23 ug/mL-FEU — ABNORMAL HIGH (ref 0.00–0.50)

## 2019-06-27 MED ORDER — BISACODYL 10 MG RE SUPP: 10.0000 mg | Freq: Every day | RECTAL | Status: DC | PRN

## 2019-06-27 MED ORDER — IPRATROPIUM-ALBUTEROL 20-100 MCG/ACT IN AERS
1.0000 | INHALATION_SPRAY | Freq: Three times a day (TID) | RESPIRATORY_TRACT | Status: DC
  Administered 2019-06-27 – 2019-07-01 (×12): 1 via RESPIRATORY_TRACT
  Filled 2019-06-27: qty 4

## 2019-06-27 MED ORDER — LORAZEPAM 2 MG/ML IJ SOLN
1.0000 mg | Freq: Once | INTRAMUSCULAR | Status: AC
  Administered 2019-06-27: 22:00:00 1 mg via INTRAVENOUS
  Filled 2019-06-27: qty 1

## 2019-06-27 MED ORDER — NYSTATIN 100000 UNIT/GM EX POWD
Freq: Three times a day (TID) | CUTANEOUS | Status: DC
  Administered 2019-06-27 – 2019-07-01 (×13): via TOPICAL
  Filled 2019-06-27: qty 15

## 2019-06-27 NOTE — Progress Notes (Signed)
[facility] PCCM  The patient and her husband spoke on the phone tonight and she changed her mind and wants to remain a full code.  For now.  Will continue to engage her and her husband in conversation about this, consider palliative care consultation.  Roselie Awkward, MD [facility] Pager: [phone] Cell: [phone] If no response, call [phone]

## 2019-06-27 NOTE — Telephone Encounter (Signed)
I spoke to Dr. Lake Bells regarding the patient's cancer status/poor tolerance to therapy/fraililty/borderline performance status at baseline.  I spoke to patient husband over the phone-is aware patient is very sick/and has a very guarded prognosis.  He understands that she might need mechanical ventilation soon if her respiratory status gets worse.  However as per husband/patient-interested in mechanical ventilation if needed for "short time". The husband understands that it is quite possible that the patient might not be weaned off the ventilator given he underlying poor respiratory status.  GB

## 2019-06-27 NOTE — Progress Notes (Signed)
Administered by RN

## 2019-06-27 NOTE — Progress Notes (Signed)
PROGRESS NOTE  Susan Houston  LZJ:[id number] DOB: [birth year] DOA: 07/16/2019 PCP: Valerie Roys, DO  Brief Narrative: Susan Houston is a 58 y.o. female with a history of 3L O2-dependent COPD, metastatic squamous cell lung CA 2019 s/p resection and recurrence 2020 undergoing chemotherapy and radiation, laryngeal CA s/p chemo and radiation 2009, depression, chronic pain who presented to the ED by EMS 9/2 with increasing shortness of breath and cough. She had a positive covid screen 9/1. In the ED, CXR demonstrated bilateral patchy opacities on chronic interstitial changes and scarring. She was requiring 6L O2. CRP elevated at 19 with normal procalcitonin. Steroids and remdesivir started and the patient was admitted to [facility]. Hemoglobin decreased in the setting of pancytopenia and 1u PRBCs given 9/3 with improvement. Despite precipitous decline in inflammatory markers, on 9/5 she demonstrated worsening hypoxia. CXR demonstrated progressive L > R airspace opacification. Convalescent plasma given and she was transferred to the ICU with PCCM consultation.  Assessment & Plan: Principal Problem:   Pneumonia due to COVID-19 virus Active Problems:   Chronic anxiety   COPD (chronic obstructive pulmonary disease) ([place])   Primary cancer of right lower lobe of lung (HCC) - metastatic squamous cell   Chronic pain syndrome   Long term current use of opiate analgesic   MDD (major depressive disorder), recurrent episode, moderate (HCC)   GAD (generalized anxiety disorder)   Acute on chronic respiratory failure with hypoxia (HCC) - on 2 L/min at night   Anemia of chronic disease  Acute on chronic hypoxemic respiratory failure due to covid-19 pneumonia: Felt to be at risk of progression to ARDS with recurrent lung cancer, COPD, frail functional status (takes 5 steps maximum prior to covid), undergoing chemotherapy. CXR overnight personally reviewed, displaying worrisome progressive airspace opacities, predominantly in  left lung.  - Continue IV steroids x10 days - Completed remdesivir (9/3 - 9/7) x5 days - Convalescent plasma given 9/5.  - Transferred to ICU 9/5, started HHF. Wean as tolerated. Appreciate PCCM consult. - PCT reassuringly undetectable, though progressive CXR infiltrate on left. Check sputum culture if able. D/w pt and RN today. - Continue airborne, contact precautions. PPE including surgical gown, gloves, cap, shoe covers, and CAPR used during this encounter in a negative pressure room.  - Check daily labs: CBC w/diff, CMP, d-dimer, ferritin, CRP.   - Enoxaparin intermediate dose (pt w/elevated d-dimer, multiple thrombogenic risk factors including active malignancy and covid infection requiring ICU level of care) - Avoid NSAIDs - Use of tocilizumab not recommended in setting of chemotherapy.  - Goals of care were discussed with patient and husband. Prognosis is guarded. Full code, would want trial of intubation.  RLL metastatic squamous cell lung CA: Had initial resection July 2019, recurrence 2020. - Undergoing chemotherapy and radiation, supervised by Dr. Rogue Bussing who is aware of admission. First round was 2 weeks PTA.  COPD: Under the care of pulmonology, Dr. Mortimer Fries - Continue controller medications, combivent, prn albuterol MDI and steroids as above.   MDD, GAD:  - Continue SSRI, prn clonazepam, augmented dose with patient's evident anxiety worsening pulmonary effort.  Chronic pain, malignancy-associated pain:  - No evidence of sedation, changed percocet 5/325mg  (PTA med) to oxyIR 5-10mg  prn mod/severe pain and continue tylenol for alternative indications. No changes planned to outpatient regimen at discharge, i.e. no prescriptions will be provided.  - Continue lyrica.  Chemotherapy-induced pancytopenia, symptomatic anemia, and anemia of chronic disease: s/p 1u PRBCs 9/3 with improvement as expected. Hgb stable. - Continue  to monitor CBC daily or prn bleeding. Counts improving.   Obesity: - Dietitian consulted  Opioid-induced constipation: Not unusual to go1-2 weeks without BM. LBM 9/1. - Changed laxative to scheduled BID, and increased to 2 tabs, added miralax. Can give dulcolax suppository prn.  Intertrigo:  - Nystatin, keep dry.   DVT prophylaxis: Lovenox 40mg  q12h Code Status: Full Family Communication: Husband by phone daily Disposition Plan: Uncertain, guarded prognosis. Continue ICU level of care. PT/OT will be important if she experiences clinical improvement.  Consultants:   PCCM   Procedures:   Covid convalescent plasma 06/24/2019  Antimicrobials:  Remdesivir (9/3 - 9/7)  Subjective: Feels stable, slightly better this morning. Still no BM though that is not unusual at all. Coughing spells remain intermittent, severe, associated with worsening hypoxia. Feel cough syrup in addition to other medications, specifically morphine, help with this.   Objective: Vitals:   06/27/19 0730 06/27/19 0731 06/27/19 0736 06/27/19 0815  BP: 140/73 129/60    Pulse: 90 89  (!) 113  Resp: 20 19  17   Temp:  97.9 F (36.6 C)    TempSrc:  Oral    SpO2: 93% (!) 88% 93% (!) 79%  Weight:      Height:        Intake/Output Summary (Last 24 hours) at 06/27/2019 1101 Last data filed at 06/27/2019 0700 Gross per 24 hour  Intake 1200 ml  Output 2050 ml  Net -850 ml   Filed Weights   07/05/2019 0501 06/25/19 0120  Weight: 85.9 kg 87.7 kg   Gen: Pleasant, chronically ill-appearing female in no distress Pulm: Nonlabored at rest w/25LPM, SpO2 88-91%. Decreased on left, rhonchi improved bilaterally. CV: Regular rate and rhythm. No murmur, rub, or gallop. No JVD, no dependent edema. GI: Abdomen soft, non-tender, non-distended, with normoactive bowel sounds.  Ext: Warm, no deformities Skin: Intertriginous erythema and evidence of fungal infection. No other rashes, lesions or ulcers on visualized skin. Neuro: Alert and oriented. No focal neurological deficits. Psych:  Judgement and insight appear fair. Mood euthymic & affect congruent. Behavior is appropriate.    Data Reviewed: I have personally reviewed following labs and imaging studies  CBC: Recent Labs  Lab 06/23/19 0000 06/24/19 0358 06/24/19 1344 06/25/19 0031 06/25/19 0435 06/26/19 0450 06/27/19 0500  WBC 3.6* 3.9*  --   --  7.5 7.6 8.8  NEUTROABS 3.2 3.1  --   --  6.8 7.0 8.1*  HGB 8.4* 9.0* 9.9* 9.5* 9.5* 8.6* 9.6*  HCT 25.8* 28.8* 29.0* 28.0* 29.6* 27.3* 30.8*  MCV 95.6 96.0  --   --  96.1 97.2 96.9  PLT 80* 92*  --   --  120* 124* 144*   Basic Metabolic Panel: Recent Labs  Lab 06/23/19 0000 06/24/19 0358 06/24/19 1344 06/25/19 0031 06/25/19 0435 06/26/19 0450 06/27/19 0500  NA 140 139 136 138 138 137 136  K 4.2 4.3 4.0 3.9 3.9 4.5 4.3  CL 103 99  --   --  92* 99 98  CO2 29 30  --   --  34* 30 30  GLUCOSE 131* 99  --   --  153* 126* 138*  BUN 15 19  --   --  20 23* 22*  CREATININE 0.69 0.63  --   --  0.62 0.57 0.63  CALCIUM 8.6* 8.8*  --   --  9.1 8.3* 8.5*   GFR: Estimated Creatinine Clearance: 77.8 mL/min (by C-G formula based on SCr of 0.63 mg/dL). Liver Function Tests:  Recent Labs  Lab 06/23/19 0000 06/24/19 0358 06/25/19 0435 06/26/19 0450 06/27/19 0500  AST 10* 11* 13* 12* 13*  ALT 10 10 12 12 13   ALKPHOS 78 80 84 78 92  BILITOT 0.5 0.5 0.5 0.5 0.7  PROT 6.5 6.8 7.3 6.5 6.7  ALBUMIN 2.7* 2.9* 3.1* 2.7* 3.0*   Anemia Panel: No results for input(s): VITAMINB12, FOLATE, FERRITIN, TIBC, IRON, RETICCTPCT in the last 72 hours. Urine analysis:    Component Value Date/Time   COLORURINE YELLOW (A) 03/03/2019 1503   APPEARANCEUR Clear 03/09/2019 1344   LABSPEC 1.010 03/03/2019 1503   LABSPEC 1.012 10/19/2013 1810   PHURINE 7.0 03/03/2019 1503   GLUCOSEU Negative 03/09/2019 1344   GLUCOSEU Negative 10/19/2013 1810   HGBUR LARGE (A) 03/03/2019 1503   BILIRUBINUR Negative 03/09/2019 1344   BILIRUBINUR Negative 10/19/2013 1810   KETONESUR NEGATIVE  03/03/2019 1503   PROTEINUR Negative 03/09/2019 1344   PROTEINUR NEGATIVE 03/03/2019 1503   UROBILINOGEN 1.0 09/07/2014 1157   NITRITE Negative 03/09/2019 1344   NITRITE NEGATIVE 03/03/2019 1503   LEUKOCYTESUR Trace (A) 03/09/2019 1344   LEUKOCYTESUR MODERATE (A) 03/03/2019 1503   LEUKOCYTESUR 1+ 10/19/2013 1810   Recent Results (from the past 240 hour(s))  SARS CORONAVIRUS 2 (TAT 6-24 HRS) Nasopharyngeal Nasopharyngeal Swab     Status: Abnormal   Collection Time: 06/20/19 11:37 AM   Specimen: Nasopharyngeal Swab  Result Value Ref Range Status   SARS Coronavirus 2 POSITIVE (A) NEGATIVE Final    Comment: (NOTE) SARS-CoV-2 target nucleic acids are DETECTED. The SARS-CoV-2 RNA is generally detectable in upper and lower respiratory specimens during the acute phase of infection. Positive results are indicative of active infection with SARS-CoV-2. Clinical  correlation with patient history and other diagnostic information is necessary to determine patient infection status. Positive results do  not rule out bacterial infection or co-infection with other viruses. The expected result is Negative. Fact Sheet for Patients: SugarRoll.be Fact Sheet for Healthcare Providers: https://www.woods-mathews.com/ This test is not yet approved or cleared by the [place] FDA and  has been authorized for detection and/or diagnosis of SARS-CoV-2 by FDA under an Emergency Use Authorization (EUA). This EUA will remain  in effect (meaning this test can be used) for the duration of the COVID-19 declaration under Section 564(b)(1) of the Act, 21 U.S.C.  section 360bbb-3(b)(1), unless the authorization is terminated or revoked sooner. Performed at [facility], [place] [address] [place], [place] [address]   MRSA PCR Screening     Status: None   Collection Time: 06/25/19  9:58 AM   Specimen: Nasal Mucosa; Nasopharyngeal  Result Value Ref Range Status   MRSA  by PCR NEGATIVE NEGATIVE Final    Comment:        The GeneXpert MRSA Assay (FDA approved for NASAL specimens only), is one component of a comprehensive MRSA colonization surveillance program. It is not intended to diagnose MRSA infection nor to guide or monitor treatment for MRSA infections. Performed at [facility], [place] [address], [place], [place] [address]       Radiology Studies: Dg Chest Port 1 View  Result Date: 06/27/2019 CLINICAL DATA:  Acute respiratory failure with hypoxia. EXAM: PORTABLE CHEST 1 VIEW COMPARISON:  Radiograph of June 25, 2019. FINDINGS: The heart size and mediastinal contours are within normal limits. Left subclavian Port-A-Cath is unchanged in position. No pneumothorax is noted. Stable diffuse left lung opacity is noted concerning for pneumonia. Slightly increased right upper lobe airspace opacity  is noted. The visualized skeletal structures are unremarkable. IMPRESSION: Stable left lung opacity is noted concerning for pneumonia. Slightly increased right upper lobe airspace opacity is noted. Electronically Signed   By: Marijo Conception M.D.   On: 06/27/2019 07:38    Scheduled Meds: . sodium chloride   Intravenous Once  . aerochamber plus with mask  1 each Other Once  . aspirin EC  81 mg Oral Daily  . benzonatate  200 mg Oral TID  . Chlorhexidine Gluconate Cloth  6 each Topical Daily  . enoxaparin (LOVENOX) injection  40 mg Subcutaneous Q12H  . feeding supplement (ENSURE ENLIVE)  237 mL Oral TID BM  . FLUoxetine  80 mg Oral Daily  . Ipratropium-Albuterol  1 puff Inhalation Q6H  . methylPREDNISolone (SOLU-MEDROL) injection  80 mg Intravenous Q12H  . nystatin   Topical TID  . pantoprazole  40 mg Oral Daily  . polyethylene glycol  17 g Oral Daily  . pregabalin  75 mg Oral TID  . senna-docusate  2 tablet Oral BID  . sucralfate  1 g Oral TID  . Suvorexant  20 mg Oral QHS   Continuous Infusions:    LOS: 5 days   Time spent: 35  minutes.  Patrecia Pour, MD [facility] [email or web address] Password [email or web address] 06/27/2019, 11:01 AM

## 2019-06-27 NOTE — Progress Notes (Signed)
Called Dr Olevia Bowens re: pt' status - continued high anxiety, high RR in high 30 to mid 40s, sats currently at 94% on 100% HHFNC  30L AND NRB in place.  New orders received.

## 2019-06-27 NOTE — Progress Notes (Signed)
Patient asked me to call her husband Tharon Aquas and give updates on a chest pain episode. Told Tharon Aquas that we did an EKG and sent to provider. Also told him that patient was given morphine and she has stated that her pain has subsided. Patient's husband was grateful for updates given.

## 2019-06-27 NOTE — Progress Notes (Signed)
Called Pt's Spouse, Susan Houston, for brief updated on pt's status.  Pt having slight issue with anxiety, meds given to assist as well as nurse given encouragement to help with decreasing anxiety to assist with improving her breathing.

## 2019-06-27 NOTE — Progress Notes (Signed)
[facility] PCCM  Lengthy conversation with the patient this afternoon discussing risks and benefits of mechanical ventilation in the setting of advanced ARDS from [name].  I explained given her baseline frail state and advanced malignancy that I thought her chances of surviving mechanical ventilation were zero.  She voiced understanding and is agreeable to a DNR code status, but wants to talk to her husband first.  Roselie Awkward, MD [place] PCCM Pager: [phone] Cell: [phone] If no response, call [phone]

## 2019-06-27 NOTE — Progress Notes (Signed)
No acute events overnight. Patient remains on HFNC 25L 60%. With coughing episodes, patients sats drop to low 70s, taking upwards of 15 minutes to recover. Pain and cough managed with PRN medications.

## 2019-06-27 NOTE — Progress Notes (Signed)
[facility] PCCM  I called Tharon Aquas her husband to discuss the nature of her illness and talk about goals of care.  He says that they have previously decided that she would go on life support if necessary.  However I don't think he was aware that her chances of survival in that case are very low and it is likely she would die on a ventilator.  I explained I would meet with her this afternoon and the two of them would likely want to have a conversation afterwards.  Roselie Awkward, MD [place] PCCM Pager: [phone] Cell: [phone] If no response, call [phone]

## 2019-06-27 NOTE — Progress Notes (Signed)
NAME:  Susan Houston, MRN:  [id number], DOB:  04-28-1961, LOS: 5 ADMISSION DATE:  06/27/2019, CONSULTATION DATE:  06/24/2019 REFERRING MD:  Bonner Puna, CHIEF COMPLAINT:  COVID 58 pneumonia   Brief History   58 year old female diagnosed with COVID-19 pneumonia on September 4, has underlying history of recurrent squamous cell carcinoma of the lung.  Transferred to [facility] for further management of hypoxemic respiratory failure   Past Medical History  Squamous cell carcinoma of right lower lobe, status post surgical resection in July 2019.  Mild cognitive impairment.  Laryngeal cancer 2009.  Hyperlipidemia.  Kidney stones.  GERD.  Diverticulitis.  COPD, on 2 L nasal cannula at night.  Anxiety.  Significant Hospital Events   Transferred to intensive care unit and received convalescent plasma on September 5  Consults:  PCCM  Procedures:    Significant Diagnostic Tests:    Micro Data:  September 1 SARS-CoV-2 positive  Antimicrobials:  Remdesivir 9/2 >  9/6 Solumedrol 9/2 >   Interim history/subjective:   Coughing spells frequent with hypoxemia, has some chest congestion, feels thick, can't get it out  Objective   Blood pressure 129/60, pulse 89, temperature 97.9 F (36.6 C), temperature source Oral, resp. rate 19, height 5\' 2"  (1.575 m), weight 87.7 kg, SpO2 (!) 88 %.    FiO2 (%):  [40 %-100 %] 40 %   Intake/Output Summary (Last 24 hours) at 06/27/2019 0754 Last data filed at 06/27/2019 0700 Gross per 24 hour  Intake 1200 ml  Output 2050 ml  Net -850 ml   Filed Weights   07/10/2019 0501 06/25/19 0120  Weight: 85.9 kg 87.7 kg    Examination:  General: Chronically ill appearing, resting comfortably in bed HENT: NCAT OP clear PULM: Rhonchi bilaterally B, normal effort CV: RRR, no mgr GI: BS+, soft, nontender MSK: normal bulk and tone Neuro: awake, alert, no distress, MAEW  9/8 CXR reviewed: left lung infiltrate, now ? New R mid lung infiltrate  Resolved Hospital  Problem list     Assessment & Plan:  ARDS from COVID-19 pneumonia: severe hypoxemia, stable oxygenation overnight; complicated by chest congestion Continue to wean off heated high flow O2 as able Tolerate periods of hypoxemia, goal at rest is greater than 85% SaO2, with movement ideally above 75% Decision for intubation should be based on a change in mental status or physical evidence of ventilatory failure such as nasal flaring, accessory muscle use, paradoxical breathing Out of bed to chair as able Incentive spirometry is important, use every hour Prone positioning while in bed 9/8 plan wean off heated high flow, add flutter valve, get out of bed  COPD: not clearly in exacerbation Continue combivent, change to QID  Metastatic lung cancer, recently treated with chemotherapy and radiation Monitor imaging  Deconditioning Out of bed Range of motion exercises   Best practice:  Diet: regular diet Pain/Anxiety/Delirium protocol (if indicated): n/a VAP protocol (if indicated): n/a DVT prophylaxis: lovenox GI prophylaxis: Pantoprazole for stress ulcer prophylaxis Glucose control: SSI Mobility: out of bed to chair Code Status: full, will discuss with  Family Communication: will update her husband today Disposition:   Labs   CBC: Recent Labs  Lab 06/23/19 0000 06/24/19 0358 06/24/19 1344 06/25/19 0031 06/25/19 0435 06/26/19 0450 06/27/19 0500  WBC 3.6* 3.9*  --   --  7.5 7.6 8.8  NEUTROABS 3.2 3.1  --   --  6.8 7.0 8.1*  HGB 8.4* 9.0* 9.9* 9.5* 9.5* 8.6* 9.6*  HCT 25.8* 28.8* 29.0*  28.0* 29.6* 27.3* 30.8*  MCV 95.6 96.0  --   --  96.1 97.2 96.9  PLT 80* 92*  --   --  120* 124* 146*    Basic Metabolic Panel: Recent Labs  Lab 06/23/19 0000 06/24/19 0358 06/24/19 1344 06/25/19 0031 06/25/19 0435 06/26/19 0450 06/27/19 0500  NA 140 139 136 138 138 137 136  K 4.2 4.3 4.0 3.9 3.9 4.5 4.3  CL 103 99  --   --  92* 99 98  CO2 29 30  --   --  34* 30 30  GLUCOSE 131*  99  --   --  153* 126* 138*  BUN 15 19  --   --  20 23* 22*  CREATININE 0.69 0.63  --   --  0.62 0.57 0.63  CALCIUM 8.6* 8.8*  --   --  9.1 8.3* 8.5*   GFR: Estimated Creatinine Clearance: 77.8 mL/min (by C-G formula based on SCr of 0.63 mg/dL). Recent Labs  Lab 06/21/19 1611  06/24/19 0358 06/25/19 0435 06/26/19 0450 06/27/19 0500  PROCALCITON <0.10  --   --  <0.10 <0.10 <0.10  WBC 2.2*   < > 3.9* 7.5 7.6 8.8   < > = values in this interval not displayed.    Liver Function Tests: Recent Labs  Lab 06/23/19 0000 06/24/19 0358 06/25/19 0435 06/26/19 0450 06/27/19 0500  AST 10* 11* 13* 12* 13*  ALT 10 10 12 12 13   ALKPHOS 78 80 84 78 92  BILITOT 0.5 0.5 0.5 0.5 0.7  PROT 6.5 6.8 7.3 6.5 6.7  ALBUMIN 2.7* 2.9* 3.1* 2.7* 3.0*   No results for input(s): LIPASE, AMYLASE in the last 168 hours. No results for input(s): AMMONIA in the last 168 hours.  ABG    Component Value Date/Time   PHART 7.434 06/25/2019 0031   PCO2ART 44.8 06/25/2019 0031   PO2ART 61.0 (L) 06/25/2019 0031   HCO3 30.2 (H) 06/25/2019 0031   TCO2 32 06/25/2019 0031   O2SAT 92.0 06/25/2019 0031     Coagulation Profile: No results for input(s): INR, PROTIME in the last 168 hours.  Cardiac Enzymes: No results for input(s): CKTOTAL, CKMB, CKMBINDEX, TROPONINI in the last 168 hours.  HbA1C: No results found for: HGBA1C  CBG: No results for input(s): GLUCAP in the last 168 hours.    Critical care time: 35 minutes    Roselie Awkward, MD [place] P[facility]M Pager: [phone] Cell: [phone] If no response, call [phone]

## 2019-06-27 NOTE — Progress Notes (Signed)
Spoke with patient's husband, Tharon Aquas and gave updates on patient's condition. Answered Frankie's questions regarding patient. Told him to call if he had further questions.

## 2019-06-27 NOTE — Progress Notes (Signed)
   06/27/19 2337  Vitals  Pulse Rate (!) 126  ECG Heart Rate (!) 127  Resp (!) 40  Oxygen Therapy  SpO2 94 %  O2 Device HFNC;Non-rebreather Mask  O2 Flow Rate (L/min) 40 L/min  FiO2 (%) 100 %    Took NRB Mask off pt at this time.  Will monitor sats.  Pt resting with eyes closed, tachypneic RR but appears more comfortable with breathing.  Monitor showed ST with rates in 125 to 130 without ectopy.

## 2019-06-27 NOTE — Progress Notes (Signed)
OT Cancellation Note  Patient Details Name: Susan Houston MRN: [id number] DOB: [birth year]   Cancelled Treatment:    Reason Eval/Treat Not Completed: Patient not medically ready; RN request to hold at this time, reports pt waxing/waning medically. Will follow up for OT eval as schedule permits.  Lou Cal, OT [facility] Pager [phone] Office [phone]   Raymondo Band 06/27/2019, 4:13 PM

## 2019-06-27 NOTE — Progress Notes (Signed)
PT Cancellation Note  Patient Details Name: Susan Houston MRN: [id number] DOB: 07-Oct-1961   Cancelled Treatment:    Reason Eval/Treat Not Completed: Medical issues which prohibited therapy   Per RN, pt having chest pain and just completed EKG. RN awaiting follow-up by MD.     Barry Brunner, PT       [name] 06/27/2019, 5:06 PM

## 2019-06-28 DIAGNOSIS — D638 Anemia in other chronic diseases classified elsewhere: Secondary | ICD-10-CM

## 2019-06-28 DIAGNOSIS — F419 Anxiety disorder, unspecified: Secondary | ICD-10-CM

## 2019-06-28 LAB — CBC WITH DIFFERENTIAL/PLATELET
Abs Immature Granulocytes: 0.26 10*3/uL — ABNORMAL HIGH (ref 0.00–0.07)
Basophils Absolute: 0 10*3/uL (ref 0.0–0.1)
Basophils Relative: 0 %
Eosinophils Absolute: 0 10*3/uL (ref 0.0–0.5)
Eosinophils Relative: 0 %
HCT: 31.8 % — ABNORMAL LOW (ref 36.0–46.0)
Hemoglobin: 9.9 g/dL — ABNORMAL LOW (ref 12.0–15.0)
Immature Granulocytes: 2 %
Lymphocytes Relative: 1 %
Lymphs Abs: 0.2 10*3/uL — ABNORMAL LOW (ref 0.7–4.0)
MCH: 29.8 pg (ref 26.0–34.0)
MCHC: 31.1 g/dL (ref 30.0–36.0)
MCV: 95.8 fL (ref 80.0–100.0)
Monocytes Absolute: 0.7 10*3/uL (ref 0.1–1.0)
Monocytes Relative: 5 %
Neutro Abs: 14.3 10*3/uL — ABNORMAL HIGH (ref 1.7–7.7)
Neutrophils Relative %: 92 %
Platelets: 157 10*3/uL (ref 150–400)
RBC: 3.32 MIL/uL — ABNORMAL LOW (ref 3.87–5.11)
RDW: 20.1 % — ABNORMAL HIGH (ref 11.5–15.5)
WBC: 15.6 10*3/uL — ABNORMAL HIGH (ref 4.0–10.5)
nRBC: 0.5 % — ABNORMAL HIGH (ref 0.0–0.2)

## 2019-06-28 LAB — COMPREHENSIVE METABOLIC PANEL
ALT: 17 U/L (ref 0–44)
AST: 19 U/L (ref 15–41)
Albumin: 2.8 g/dL — ABNORMAL LOW (ref 3.5–5.0)
Alkaline Phosphatase: 88 U/L (ref 38–126)
Anion gap: 11 (ref 5–15)
BUN: 28 mg/dL — ABNORMAL HIGH (ref 6–20)
CO2: 30 mmol/L (ref 22–32)
Calcium: 8.7 mg/dL — ABNORMAL LOW (ref 8.9–10.3)
Chloride: 92 mmol/L — ABNORMAL LOW (ref 98–111)
Creatinine, Ser: 0.82 mg/dL (ref 0.44–1.00)
GFR calc Af Amer: >60 mL/min (ref >60)
GFR calc non Af Amer: >60 mL/min (ref >60)
Glucose, Bld: 157 mg/dL — ABNORMAL HIGH (ref 70–99)
Potassium: 4.6 mmol/L (ref 3.5–5.1)
Sodium: 133 mmol/L — ABNORMAL LOW (ref 135–145)
Total Bilirubin: 0.9 mg/dL (ref 0.3–1.2)
Total Protein: 6.6 g/dL (ref 6.5–8.1)

## 2019-06-28 LAB — D-DIMER, QUANTITATIVE: D-Dimer, Quant: 2.2 ug/mL-FEU — ABNORMAL HIGH (ref 0.00–0.50)

## 2019-06-28 LAB — C-REACTIVE PROTEIN: CRP: 17.4 mg/dL — ABNORMAL HIGH (ref <1.0)

## 2019-06-28 MED ORDER — METHYLPREDNISOLONE SODIUM SUCC 40 MG IJ SOLR
20.0000 mg | Freq: Two times a day (BID) | INTRAMUSCULAR | Status: DC
  Administered 2019-06-28 – 2019-06-30 (×5): 20 mg via INTRAVENOUS
  Filled 2019-06-28 (×5): qty 1

## 2019-06-28 MED ORDER — ZOLPIDEM TARTRATE 5 MG PO TABS
5.0000 mg | ORAL_TABLET | Freq: Every evening | ORAL | Status: DC | PRN
  Administered 2019-06-28 – 2019-06-30 (×3): 5 mg via ORAL
  Filled 2019-06-28 (×3): qty 1

## 2019-06-28 MED ORDER — POLYETHYLENE GLYCOL 3350 17 G PO PACK
17.0000 g | PACK | Freq: Two times a day (BID) | ORAL | Status: AC
  Administered 2019-06-28: 17 g via ORAL
  Filled 2019-06-28 (×2): qty 1

## 2019-06-28 MED ORDER — POLYETHYLENE GLYCOL 3350 17 G PO PACK
17.0000 g | PACK | Freq: Every day | ORAL | Status: DC
  Administered 2019-06-30: 10:00:00 17 g via ORAL
  Filled 2019-06-28: qty 1

## 2019-06-28 NOTE — Plan of Care (Signed)
Early in shift, pt's anxiety level was high after started coughing and was having issue with recovering back to baseline.  Due to feeling SOB/dyspneic, pt' anxiety increased thus causing more issues with SOB/Dyspnea.  Provider called to assist with issue - IV ativan x 1 dose given then withing 30 minutes other PRN PO meds given for anxiety.  Pt finally able to rest comfortably while maintaining a sat level 88% or better while Heated HFNC 40L/100%.  During espidose of increasing of anxiety,SOB, and coughing, pt's oxygen needs had increased from 30L/80% to 40L/100% + NRB mask to get sats above 88%.    Continue to work with pt with emotional support as well medications PRN to assist in decreasing anxiety level to assist with breathing/keeping sats 88% or better.  Monitor showed ST 130s with higher anxiety level but down to 110s while sleeping.  NO ectopy noted.  Problem: Health Behavior/Discharge Planning: Goal: Ability to manage health-related needs will improve Outcome: Not Progressing   Problem: Clinical Measurements: Goal: Respiratory complications will improve Outcome: Not Progressing   Problem: Activity: Goal: Risk for activity intolerance will decrease Outcome: Not Progressing   Problem: Coping: Goal: Level of anxiety will decrease Outcome: Not Progressing

## 2019-06-28 NOTE — Progress Notes (Signed)
NAME:  Susan Houston, MRN:  [id number], DOB:  1961/03/12, LOS: 6 ADMISSION DATE:  07/10/2019, CONSULTATION DATE:  06/24/2019 REFERRING MD:  Bonner Puna, CHIEF COMPLAINT:  COVID 17 pneumonia   Brief History   58 year old female diagnosed with COVID-19 pneumonia on September 4, has underlying history of recurrent squamous cell carcinoma of the lung.  Transferred to [facility] for further management of hypoxemic respiratory failure  Past Medical History  Squamous cell carcinoma of right lower lobe, status post surgical resection in July 2019.  Mild cognitive impairment.  Laryngeal cancer 2009.  Hyperlipidemia.  Kidney stones.  GERD.  Diverticulitis.  COPD, on 2 L nasal cannula at night.  Anxiety.  Significant Hospital Events   Transferred to intensive care unit and received convalescent plasma on September 5  Consults:  PCCM  Procedures:    Significant Diagnostic Tests:    Micro Data:  September 1 SARS-CoV-2 positive  Antimicrobials:  Remdesivir 9/2 >  9/6 Solumedrol 9/2 >   Interim history/subjective:   Tired Still coughing a lot Didn't sleep much  Objective   Blood pressure 108/62, pulse (!) 101, temperature 100.2 F (37.9 C), temperature source Axillary, resp. rate (!) 26, height 5\' 2"  (1.575 m), weight 87.3 kg, SpO2 91 %.    FiO2 (%):  [60 %-100 %] 80 %   Intake/Output Summary (Last 24 hours) at 06/28/2019 0811 Last data filed at 06/28/2019 0600 Gross per 24 hour  Intake 600 ml  Output 1300 ml  Net -700 ml   Filed Weights   06/21/2019 0501 06/25/19 0120 06/28/19 0418  Weight: 85.9 kg 87.7 kg 87.3 kg    Examination:  General: chronically ill appearing, frail, resting comfortably in bed HENT: NCAT OP clear PULM: Crackles left lung more than right B, normal effort CV: RRR, no mgr GI: BS+, soft, nontender MSK: normal bulk and tone Neuro: awake, alert, no distress, MAEW   9/8 CXR reviewed: left lung infiltrate, now ? New R mid lung infiltrate  Resolved  Hospital Problem list     Assessment & Plan:  ARDS from COVID-19 pneumonia: severe hypoxemia, stable oxygenation overnight; complicated by chest congestion Continue to wean off heated high flow O2 as able Continue flutter valve Tolerate periods of hypoxemia, goal at rest is greater than 85% SaO2, with movement ideally above 75% Decision for intubation should be based on a change in mental status or physical evidence of ventilatory failure such as nasal flaring, accessory muscle use, paradoxical breathing Out of bed to chair as able Incentive spirometry is important, use every hour Prone positioning while in bed Complete 10 days systemic steroids, reduce dose to 0.5mg /kg/day  COPD: not clearly in exacerbation combivent QID  Metastatic lung cancer, recently treated with chemotherapy and radiation Monitor imaging  Deconditioning Out of bed Range of mostion PT/OT  Prognosis Guarded; lots of conversation with family yesterday.  I expressed to them that I do not feel that she would do well with mechanical ventilation but considering her young age they would prefer to try it should she get to the point of severe deterioration.  They do feel that it would be reasonable to stop efforts after 7 days of mechanical ventilation if she did not improve.  Best practice:  Diet: regular diet Pain/Anxiety/Delirium protocol (if indicated): n/a VAP protocol (if indicated): n/a DVT prophylaxis: lovenox GI prophylaxis: Pantoprazole for stress ulcer prophylaxis Glucose control: SSI Mobility: out of bed to chair Code Status: FULL  Family Communication: I called her husband today, see  discussion above Disposition:   Labs   CBC: Recent Labs  Lab 06/24/19 0358  06/25/19 0031 06/25/19 0435 06/26/19 0450 06/27/19 0500 06/28/19 0400  WBC 3.9*  --   --  7.5 7.6 8.8 15.6*  NEUTROABS 3.1  --   --  6.8 7.0 8.1* 14.3*  HGB 9.0*   < > 9.5* 9.5* 8.6* 9.6* 9.9*  HCT 28.8*   < > 28.0* 29.6* 27.3* 30.8*  31.8*  MCV 96.0  --   --  96.1 97.2 96.9 95.8  PLT 92*  --   --  120* 124* 146* 157   < > = values in this interval not displayed.    Basic Metabolic Panel: Recent Labs  Lab 06/24/19 0358  06/25/19 0031 06/25/19 0435 06/26/19 0450 06/27/19 0500 06/28/19 0400  NA 139   < > 138 138 137 136 133*  K 4.3   < > 3.9 3.9 4.5 4.3 4.6  CL 99  --   --  92* 99 98 92*  CO2 30  --   --  34* 30 30 30   GLUCOSE 99  --   --  153* 126* 138* 157*  BUN 19  --   --  20 23* 22* 28*  CREATININE 0.63  --   --  0.62 0.57 0.63 0.82  CALCIUM 8.8*  --   --  9.1 8.3* 8.5* 8.7*   < > = values in this interval not displayed.   GFR: Estimated Creatinine Clearance: 75.8 mL/min (by C-G formula based on SCr of 0.82 mg/dL). Recent Labs  Lab 06/21/19 1611  06/25/19 0435 06/26/19 0450 06/27/19 0500 06/28/19 0400  PROCALCITON <0.10  --  <0.10 <0.10 <0.10  --   WBC 2.2*   < > 7.5 7.6 8.8 15.6*   < > = values in this interval not displayed.    Liver Function Tests: Recent Labs  Lab 06/24/19 0358 06/25/19 0435 06/26/19 0450 06/27/19 0500 06/28/19 0400  AST 11* 13* 12* 13* 19  ALT 10 12 12 13 17   ALKPHOS 80 84 78 92 88  BILITOT 0.5 0.5 0.5 0.7 0.9  PROT 6.8 7.3 6.5 6.7 6.6  ALBUMIN 2.9* 3.1* 2.7* 3.0* 2.8*   No results for input(s): LIPASE, AMYLASE in the last 168 hours. No results for input(s): AMMONIA in the last 168 hours.  ABG    Component Value Date/Time   PHART 7.434 06/25/2019 0031   PCO2ART 44.8 06/25/2019 0031   PO2ART 61.0 (L) 06/25/2019 0031   HCO3 30.2 (H) 06/25/2019 0031   TCO2 32 06/25/2019 0031   O2SAT 92.0 06/25/2019 0031     Coagulation Profile: No results for input(s): INR, PROTIME in the last 168 hours.  Cardiac Enzymes: No results for input(s): CKTOTAL, CKMB, CKMBINDEX, TROPONINI in the last 168 hours.  HbA1C: No results found for: HGBA1C  CBG: No results for input(s): GLUCAP in the last 168 hours.    Critical care time: 32 minutes    Roselie Awkward, MD  [place] [facility] Pager: [phone] Cell: [phone] If no response, call [phone]

## 2019-06-28 NOTE — Progress Notes (Signed)
Occupational Therapy Evaluation Patient Details Name: Susan Houston MRN: [id number] DOB: Jul 16, 1961 Today's Date: 06/28/2019    History of Present Illness 58 y.o. female admitted on 06/21/2019 for SOB.  Found to have COVID-19 PNA.  Pt currently undergoing tx for R LL metastatic squamous cell lung CA (s/p resection july 2019), recurrence in 2020 actively getting chemo.  Pt wiht other significant PMH of COPD, chronic pain, laryngeal CA, lumbar facet syndrome, anemia, migraine, uses 2 L O2 [place] at baseline, but recently (for the past 4 weeks has had to use 4 L O2 [place]), bil TKA.     Clinical Impression   PTA, lived at home with husband, who assisted as needed with ADL due to weakness from chemo. Pt seen on 30L FiO2 70% with SpO2 dropping to 84 (ear probe); RR 26; BP 93/55 with mobility and DOE 3/4. Pt able to stand with Min A and sit EOB for @ 5 min. Return to supine O2 91 (ear probe); RR 33. Pt given theraband to work with while in bed. Also given word search puzzles to help with relaxation - pt states "it keeps my mind off things". Husband face timed pt during session.  Feel pt will be appropriate to DC home with HHOT - pending progress. Pt very appreciative.     Follow Up Recommendations  Home health OT;Supervision/Assistance - 24 hour    Equipment Recommendations  3 in 1 bedside commode    Recommendations for Other Services       Precautions / Restrictions Precautions Precautions: Fall;Other (comment)(monitor )2) Precaution Comments: monitor sats      Mobility Bed Mobility Overal bed mobility: Needs Assistance Bed Mobility: Rolling;Sidelying to Sit;Sit to Sidelying Rolling: Supervision Sidelying to sit: Min guard;HOB elevated     Sit to sidelying: Min guard;HOB elevated    Transfers Overall transfer level: Needs assistance Equipment used: 1 person hand held assist   Sit to Stand: Min assist         General transfer comment: A to power up    Balance Overall balance assessment:  Needs assistance   Sitting balance-Leahy Scale: Good       Standing balance-Leahy Scale: Poor Standing balance comment: exteranl support                           ADL either performed or assessed with clinical judgement   ADL Overall ADL's : Needs assistance/impaired Eating/Feeding: Set up   Grooming: Set up;Sitting   Upper Body Bathing: Minimal assistance;Sitting   Lower Body Bathing: Moderate assistance;Sit to/from stand   Upper Body Dressing : Minimal assistance;Sitting   Lower Body Dressing: Moderate assistance;Sit to/from stand               Functional mobility during ADLs: Minimal assistance(sit - stand only) General ADL Comments: Limited by [employer]     Vision         Perception     Praxis      Pertinent Vitals/Pain Pain Assessment: Faces Faces Pain Scale: No hurt     Hand Dominance Right   Extremity/Trunk Assessment Upper Extremity Assessment Upper Extremity Assessment: Generalized weakness   Lower Extremity Assessment Lower Extremity Assessment: Defer to PT evaluation   Cervical / Trunk Assessment Cervical / Trunk Assessment: Normal   Communication     Cognition Arousal/Alertness: Awake/alert Behavior During Therapy: WFL for tasks assessed/performed Overall Cognitive Status: Within Functional Limits for tasks assessed  General Comments  Has a 59 yo grand daughter "Susan Houston'; enjoys word search puzzles    Exercises     Shoulder Instructions      Home Living Family/patient expects to be discharged to:: Private residence Living Arrangements: Spouse/significant other Available Help at Discharge: Family;Available 24 hours/day Type of Home: House Home Access: Stairs to enter [employer] of Steps: 5 Entrance Stairs-Rails: Can reach both Home Layout: One level     Bathroom Shower/Tub: Teacher, early years/pre: Standard Bathroom Accessibility:  Yes How Accessible: Accessible via walker Home Equipment: [place] - 2 wheels;Cane - quad;Bedside commode;Shower seat;Grab bars - tub/shower          Prior Functioning/Environment Level of Independence: Needs assistance  Gait / Transfers Assistance Needed: gait with no AD, still drives, does not work she is on disability.  ADL's / Homemaking Assistance Needed: Pt reports she is independent with bathing and dressing but husband provides assistance for cooking and cleaning.   Comments: Pt states husband helps when she needs help as a result from chemo        OT Problem List: Decreased strength;Decreased activity tolerance;Impaired balance (sitting and/or standing);Decreased safety awareness;Decreased knowledge of use of DME or AE;Cardiopulmonary status limiting activity;Obesity      OT Treatment/Interventions: Self-care/ADL training;Therapeutic exercise;Neuromuscular education;Energy conservation;DME and/or AE instruction;Therapeutic activities;Patient/family education;Balance training    OT Goals(Current goals can be found in the care plan section) Acute Rehab OT Goals Patient Stated Goal: to go home OT Goal Formulation: With patient Time For Goal Achievement: 07/12/19 Potential to Achieve Goals: Good  OT Frequency: Min 3X/week   Barriers to D/C:            Co-evaluation PT/OT/SLP Co-Evaluation/Treatment: Yes Reason for Co-Treatment: Complexity of the patient's impairments (multi-system involvement);To address functional/ADL transfers   OT goals addressed during session: ADL's and self-care;Strengthening/ROM      AM-PAC OT "6 Clicks" Daily Activity     Outcome Measure Help from another person eating meals?: A Little Help from another person taking care of personal grooming?: A Little Help from another person toileting, which includes using toliet, bedpan, or urinal?: A Lot Help from another person bathing (including washing, rinsing, drying)?: A Lot Help from another  person to put on and taking off regular upper body clothing?: A Little Help from another person to put on and taking off regular lower body clothing?: A Lot 6 Click Score: 15   End of Session Equipment Utilized During Treatment: Oxygen(30L; 70% FiO2) Nurse Communication: Mobility status  Activity Tolerance: Patient tolerated treatment well Patient left: in bed;with call bell/phone within reach  OT Visit Diagnosis: Unsteadiness on feet (R26.81);Muscle weakness (generalized) (M62.81)                Time: 2876-8115 OT Time Calculation (min): 38 min Charges:  OT General Charges $OT Visit: 1 Visit OT Evaluation $OT Eval Moderate Complexity: [place], OT/L   Acute OT Clinical Specialist [place] Pager [phone] Office [phone]   [facility] 06/28/2019, 4:13 PM

## 2019-06-28 NOTE — Plan of Care (Signed)
Pt updated on POC and current status.

## 2019-06-28 NOTE — Progress Notes (Signed)
[facility] PROGRESS NOTE    Progress Note  Susan Houston  MBW:[id number] DOB: 09/10/1961 DOA: 06/23/2019 PCP: Valerie Roys, DO     Brief Narrative:   Susan Houston is an 58 y.o. female past medical history of CAD, COPD 3 L oxygen dependent, metastatic squamous cell carcinoma diagnosed in 2019, status post resection with recurrence in 2020 undergoing chemo and radiation therapy. She is also status post radiation therapy due to related laryngeal cancer in 2019, depression, chronic pain who presents to the ED on 06/21/2019 with increased shortness of breath and cough, chest x-ray showed bilateral patchy infiltrates in her SARS-CoV-2 test was positive, she was requiring 6 L of oxygen to keep saturation greater than 90%. On admission her CRP was elevated at 19 with a normal procalcitonin. She was started on IV steroids and Remdesivir.  Repeated chest x-ray showed progressive left greater than right airspace opacity, she was given convalescent plasma and transferred to the ICU and PCCM was consulted.  Assessment/Plan:   Acute respiratory disease due to COVID-19 virus declined 19 viral pneumonia: Felt to be high risk to progress into ARDS with recurrent lung cancer and history of laryngeal cancer, her functional status is poor prior to COVID-19 viral infection. Chest x-ray overnight on 06/26/19/2020 showed progressive opacity. She is on IV steroids for 10 days. She has completed a course of IV Remdesivir. She was given convalescent plasma 06/24/2019. Transfer to the ICU 06/24/2019 started on high heated high flow. PCCM was consulted He is currently on intermediate dose of enoxaparin. Worsening leukocytosis. She has a very poor prognosis. We will have to speak to the husband about her CODE STATUS and intubation status.  Right lower lobe metastatic squamous cell cancer: Currently undergoing chemo and radiation therapy by oncology. Her first round of chemotherapy was 2 weeks prior to admission.   COPD: To new current medications albuterol and steroid as above.  Major depressive disorder/general anxiety disorder: Continue SSRI and clonazepam.  Chronic pain due to malignancy associated metastatic disease: Continue current regimen as well as Lyrica.  Chemotherapy-induced pancytopenia/symptomatic anemia: She is status post 1 unit packed red blood cells on 07/03/2019. Susan Houston has remained relatively stable.  Constipation: Continue current bowel regimen.  Candidal intertrigo: Continue nystatin.  Opioid-induced constipation: Continue current regimen.  DVT prophylaxis: lovenxo Family Communication:none Disposition Plan/Barrier to D/C: unable to determine Code Status:     Code Status Orders  (From admission, onward)         Start     Ordered   07/15/2019 0221  Full code  Continuous     06/30/2019 0220        Code Status History    Date Active Date Inactive Code Status Order ID Comments User Context   07/01/2019 0220 07/06/2019 0220 Full Code [id number]  Kristopher Oppenheim, DO Inpatient   11/29/2018 1108 12/02/2018 1743 Full Code [id number]  Hessie Knows, MD Inpatient   05/16/2018 1126 05/20/2018 1644 Full Code [id number]  Nestor Lewandowsky, MD Inpatient   09/24/2014 1653 09/25/2014 2132 Full Code [id number]  Pricilla Loveless, PA-C Inpatient   Advance Care Planning Activity    Advance Directive Documentation     Most Recent Value  Type of Advance Directive  Living will  Pre-existing out of facility DNR order (yellow form or pink MOST form)  -  "MOST" Form in Place?  -        IV Access:    Peripheral IV   Procedures and diagnostic studies:   Dg  Chest Port 1 View  Result Date: 06/27/2019 CLINICAL DATA:  Acute respiratory failure with hypoxia. EXAM: PORTABLE CHEST 1 VIEW COMPARISON:  Radiograph of June 25, 2019. FINDINGS: The heart size and mediastinal contours are within normal limits. Left subclavian Port-A-Cath is unchanged in position. No pneumothorax is noted. Stable  diffuse left lung opacity is noted concerning for pneumonia. Slightly increased right upper lobe airspace opacity is noted. The visualized skeletal structures are unremarkable. IMPRESSION: Stable left lung opacity is noted concerning for pneumonia. Slightly increased right upper lobe airspace opacity is noted. Electronically Signed   By: Marijo Conception M.D.   On: 06/27/2019 07:38     Medical Consultants:    None.  Anti-Infectives:    Subjective:    Susan Houston she relates she does not feel well she still feels short of breath tired and fatigued.  Objective:    Vitals:   06/28/19 0456 06/28/19 0600 06/28/19 0700 06/28/19 0752  BP:  110/65 108/62 108/62  Pulse: (!) 107 (!) 103 (!) 101 (!) 101  Resp: (!) 21 (!) 24 (!) 23 (!) 26  Temp:      TempSrc:      SpO2: 97% 95% 93% 91%  Weight:      Height:       SpO2: 91 % O2 Flow Rate (L/min): 30 L/min FiO2 (%): 80 %   Intake/Output Summary (Last 24 hours) at 06/28/2019 0824 Last data filed at 06/28/2019 0600 Gross per 24 hour  Intake 600 ml  Output 1300 ml  Net -700 ml   Filed Weights   06/28/2019 0501 06/25/19 0120 06/28/19 0418  Weight: 85.9 kg 87.7 kg 87.3 kg    Exam: General exam: In no acute distress. Respiratory system: Good air movement and diffuse crackles bilaterally. Cardiovascular system: S1 & S2 heard, RRR. No JVD. Gastrointestinal system: Abdomen is nondistended, soft and nontender.  Central nervous system: Alert and oriented. No focal neurological deficits. Extremities: No pedal edema. Skin: No rashes, lesions or ulcers Psychiatry: Judgement and insight appear normal. Mood & affect appropriate.    Data Reviewed:    Labs: Basic Metabolic Panel: Recent Labs  Lab 06/24/19 0358  06/25/19 0031 06/25/19 0435 06/26/19 0450 06/27/19 0500 06/28/19 0400  NA 139   < > 138 138 137 136 133*  K 4.3   < > 3.9 3.9 4.5 4.3 4.6  CL 99  --   --  92* 99 98 92*  CO2 30  --   --  34* 30 30 30   GLUCOSE 99  --   --   153* 126* 138* 157*  BUN 19  --   --  20 23* 22* 28*  CREATININE 0.63  --   --  0.62 0.57 0.63 0.82  CALCIUM 8.8*  --   --  9.1 8.3* 8.5* 8.7*   < > = values in this interval not displayed.   GFR Estimated Creatinine Clearance: 75.8 mL/min (by C-G formula based on SCr of 0.82 mg/dL). Liver Function Tests: Recent Labs  Lab 06/24/19 0358 06/25/19 0435 06/26/19 0450 06/27/19 0500 06/28/19 0400  AST 11* 13* 12* 13* 19  ALT 10 12 12 13 17   ALKPHOS 80 84 78 92 88  BILITOT 0.5 0.5 0.5 0.7 0.9  PROT 6.8 7.3 6.5 6.7 6.6  ALBUMIN 2.9* 3.1* 2.7* 3.0* 2.8*   No results for input(s): LIPASE, AMYLASE in the last 168 hours. No results for input(s): AMMONIA in the last 168 hours. Coagulation profile No results  for input(s): INR, PROTIME in the last 168 hours. COVID-19 Labs  Recent Labs    06/26/19 0450 06/27/19 0500 06/28/19 0400  DDIMER 0.98* 1.23* 2.20*  CRP 6.1* 3.5* 17.4*    Lab Results  Component Value Date   SARSCOV2NAA POSITIVE (A) 06/20/2019   [place] NEGATIVE 04/25/2019   SARSCOV2NAA NOT DETECTED 03/31/2019   SARSCOV2NAA NOT DETECTED 03/16/2019    CBC: Recent Labs  Lab 06/24/19 0358  06/25/19 0031 06/25/19 0435 06/26/19 0450 06/27/19 0500 06/28/19 0400  WBC 3.9*  --   --  7.5 7.6 8.8 15.6*  NEUTROABS 3.1  --   --  6.8 7.0 8.1* 14.3*  HGB 9.0*   < > 9.5* 9.5* 8.6* 9.6* 9.9*  HCT 28.8*   < > 28.0* 29.6* 27.3* 30.8* 31.8*  MCV 96.0  --   --  96.1 97.2 96.9 95.8  PLT 92*  --   --  120* 124* 146* 157   < > = values in this interval not displayed.   Cardiac Enzymes: No results for input(s): CKTOTAL, CKMB, CKMBINDEX, TROPONINI in the last 168 hours. BNP (last 3 results) No results for input(s): PROBNP in the last 8760 hours. CBG: No results for input(s): GLUCAP in the last 168 hours. D-Dimer: Recent Labs    06/27/19 0500 06/28/19 0400  DDIMER 1.23* 2.20*   Hgb A1c: No results for input(s): HGBA1C in the last 72 hours. Lipid Profile: No results for  input(s): CHOL, HDL, LDLCALC, TRIG, CHOLHDL, LDLDIRECT in the last 72 hours. Thyroid function studies: No results for input(s): TSH, T4TOTAL, T3FREE, THYROIDAB in the last 72 hours.  Invalid input(s): FREET3 Anemia work up: No results for input(s): VITAMINB12, FOLATE, FERRITIN, TIBC, IRON, RETICCTPCT in the last 72 hours. Sepsis Labs: Recent Labs  Lab 06/21/19 1611  06/25/19 0435 06/26/19 0450 06/27/19 0500 06/28/19 0400  PROCALCITON <0.10  --  <0.10 <0.10 <0.10  --   WBC 2.2*   < > 7.5 7.6 8.8 15.6*   < > = values in this interval not displayed.   Microbiology Recent Results (from the past 240 hour(s))  SARS CORONAVIRUS 2 (TAT 6-24 HRS) Nasopharyngeal Nasopharyngeal Swab     Status: Abnormal   Collection Time: 06/20/19 11:37 AM   Specimen: Nasopharyngeal Swab  Result Value Ref Range Status   SARS Coronavirus 2 POSITIVE (A) NEGATIVE Final    Comment: (NOTE) SARS-CoV-2 target nucleic acids are DETECTED. The SARS-CoV-2 RNA is generally detectable in upper and lower respiratory specimens during the acute phase of infection. Positive results are indicative of active infection with SARS-CoV-2. Clinical  correlation with patient history and other diagnostic information is necessary to determine patient infection status. Positive results do  not rule out bacterial infection or co-infection with other viruses. The expected result is Negative. Fact Sheet for Patients: SugarRoll.be Fact Sheet for Healthcare Providers: https://www.woods-mathews.com/ This test is not yet approved or cleared by the [place] FDA and  has been authorized for detection and/or diagnosis of SARS-CoV-2 by FDA under an Emergency Use Authorization (EUA). This EUA will remain  in effect (meaning this test can be used) for the duration of the COVID-19 declaration under Section 564(b)(1) of the Act, 21 U.S.C.  section 360bbb-3(b)(1), unless the authorization is  terminated or revoked sooner. Performed at [facility], [place] [address], [place], [place] [address]   MRSA PCR Screening     Status: None   Collection Time: 06/25/19  9:58 AM   Specimen: Nasal Mucosa; Nasopharyngeal  Result  Value Ref Range Status   MRSA by PCR NEGATIVE NEGATIVE Final    Comment:        The GeneXpert MRSA Assay (FDA approved for NASAL specimens only), is one component of a comprehensive MRSA colonization surveillance program. It is not intended to diagnose MRSA infection nor to guide or monitor treatment for MRSA infections. Performed at [facility], [place] [address], [place], [place] [address]   Expectorated sputum assessment w rflx to resp cult     Status: None   Collection Time: 06/27/19  8:19 PM   Specimen: Sputum  Result Value Ref Range Status   Specimen Description SPU  Final   Special Requests Immunocompromised  Final   Sputum evaluation   Final    THIS SPECIMEN IS ACCEPTABLE FOR SPUTUM CULTURE Performed at [facility], [place] [address], [place], [place] [address]    Report Status 06/27/2019 FINAL  Final  Culture, respiratory     Status: None (Preliminary result)   Collection Time: 06/27/19  8:19 PM   Specimen: Sputum  Result Value Ref Range Status   Specimen Description   Final    SPU Performed at [facility] [address], [place], [place] [address]    Special Requests   Final    Immunocompromised Reflexed from [phone] Performed at [facility], [place] [address], [place], [place] [address]    Gram Stain   Final    MODERATE WBC PRESENT, PREDOMINANTLY PMN MODERATE GRAM NEGATIVE RODS FEW GRAM POSITIVE COCCI IN CLUSTERS Performed at [facility], [place] [address], [place], [place] [address]    Culture PENDING  Incomplete   Report Status PENDING  Incomplete     Medications:   . sodium chloride   Intravenous Once  . aerochamber plus with mask  1 each Other  Once  . aspirin EC  81 mg Oral Daily  . benzonatate  200 mg Oral TID  . Chlorhexidine Gluconate Cloth  6 each Topical Daily  . enoxaparin (LOVENOX) injection  40 mg Subcutaneous Q12H  . feeding supplement (ENSURE ENLIVE)  237 mL Oral TID BM  . FLUoxetine  80 mg Oral Daily  . Ipratropium-Albuterol  1 puff Inhalation TID  . methylPREDNISolone (SOLU-MEDROL) injection  80 mg Intravenous Q12H  . nystatin   Topical TID  . pantoprazole  40 mg Oral Daily  . polyethylene glycol  17 g Oral Daily  . pregabalin  75 mg Oral TID  . senna-docusate  2 tablet Oral BID  . sucralfate  1 g Oral TID  . Suvorexant  20 mg Oral QHS   Continuous Infu   LOS: 6 days   Charlynne Cousins  [facility]  06/28/2019, 8:24 AM

## 2019-06-28 NOTE — Progress Notes (Signed)
Called and updated pt's spouse, Tharon Aquas.  No changes in pt's status.  Pt remains on HFNC at 15L and keeping sats within parameters.

## 2019-06-28 NOTE — Progress Notes (Signed)
Physical Therapy Treatment Patient Details Name: Susan Houston MRN: [id number] DOB: 26-Sep-1961 Today's Date: 06/28/2019    History of Present Illness 58 y.o. female admitted on 07/03/2019 for SOB.  Found to have COVID-19 PNA.  Pt currently undergoing tx for R LL metastatic squamous cell lung CA (s/p resection july 2019), recurrence in 2020 actively getting chemo.  Pt wiht other significant PMH of COPD, chronic pain, laryngeal CA, lumbar facet syndrome, anemia, migraine, uses 2 L O2 [place] at baseline, but recently (for the past 4 weeks has had to use 4 L O2 [place]), bil TKA.      PT Comments    Patient  Able to participate in mobility, sitting on bed edge ans stood x 1 with HHA, Patient is on70%FiO2, 30 L HHFNC. Patient's SPO2 dropped  To 84% sitting with probe on ear( reads lower than finger). Patient sat avout 10 minutes.  HR max 113, BP 113/61 sup, 123/46 sitting.  RR up to 33. Continue  PT for increased mobility as tolerated.   Follow Up Recommendations  Home health PT;Supervision for mobility/OOB     Equipment Recommendations  None recommended by PT    Recommendations for Other Services       Precautions / Restrictions Precautions Precautions: Fall;Other (comment) Precaution Comments: monitor sats Restrictions Weight Bearing Restrictions: No    Mobility  Bed Mobility Overal bed mobility: Needs Assistance Bed Mobility: Rolling;Sidelying to Sit;Sit to Sidelying Rolling: Supervision Sidelying to sit: Min guard;HOB elevated     Sit to sidelying: Min guard;HOB elevated    Transfers Overall transfer level: Needs assistance Equipment used: 1 person hand held assist   Sit to Stand: Min assist         General transfer comment: A to power up  Ambulation/Gait                 Stairs             Wheelchair Mobility    Modified Rankin (Stroke Patients Only)       Balance Overall balance assessment: Needs assistance   Sitting balance-Leahy Scale: Good        Standing balance-Leahy Scale: Poor Standing balance comment: exteranl support                            Cognition Arousal/Alertness: Awake/alert Behavior During Therapy: WFL for tasks assessed/performed Overall Cognitive Status: Within Functional Limits for tasks assessed                                        Exercises      General Comments General comments (skin integrity, edema, etc.): Has a 74 yo grand daughter "Susan Houston'; enjoys word search puzzles      Pertinent Vitals/Pain Pain Assessment: No/denies pain Faces Pain Scale: No hurt    Home Living Family/patient expects to be discharged to:: Private residence Living Arrangements: Spouse/significant other Available Help at Discharge: Family;Available 24 hours/day Type of Home: House Home Access: Stairs to enter Entrance Stairs-Rails: Can reach both Home Layout: One level Home Equipment: Walker - 2 wheels;Cane - quad;Bedside commode;Shower seat;Grab bars - tub/shower      Prior Function Level of Independence: Needs assistance  Gait / Transfers Assistance Needed: gait with no AD, still drives, does not work she is on disability.  ADL's / Homemaking Assistance Needed: Pt reports she is independent  with bathing and dressing but husband provides assistance for cooking and cleaning. Comments: Pt states husband helps when she needs help as a result from chemo   PT Goals (current goals can now be found in the care plan section) Acute Rehab PT Goals Patient Stated Goal: to go home Progress towards PT goals: Progressing toward goals    Frequency    Min 3X/week      PT Plan Current plan remains appropriate    Co-evaluation PT/OT/SLP Co-Evaluation/Treatment: Yes Reason for Co-Treatment: Complexity of the patient's impairments (multi-system involvement);For patient/therapist safety PT goals addressed during session: Mobility/safety with mobility OT goals addressed during session: ADL's and  self-care;Strengthening/ROM      AM-PAC PT "6 Clicks" Mobility   Outcome Measure  Help needed turning from your back to your side while in a flat bed without using bedrails?: A Little   Help needed moving to and from a bed to a chair (including a wheelchair)?: A Lot Help needed standing up from a chair using your arms (e.g., wheelchair or bedside chair)?: A Lot Help needed to walk in hospital room?: Total Help needed climbing 3-5 steps with a railing? : Total 6 Click Score: 9    End of Session Equipment Utilized During Treatment: Oxygen Activity Tolerance: Patient limited by fatigue;Treatment limited secondary to medical complications (Comment) Patient left: in bed;with call bell/phone within reach Nurse Communication: Mobility status PT Visit Diagnosis: Muscle weakness (generalized) (M62.81);Difficulty in walking, not elsewhere classified (R26.2)     Time: 1025-8527 PT Time Calculation (min) (ACUTE ONLY): 38 min  Charges:  $Therapeutic Activity: 8-22 mins                     Tresa Endo PT Acute Rehabilitation Services Pager [phone] Office [phone]    Claretha Cooper 06/28/2019, 5:41 PM

## 2019-06-29 ENCOUNTER — Inpatient Hospital Stay: Payer: Medicare Other | Admitting: Internal Medicine

## 2019-06-29 ENCOUNTER — Inpatient Hospital Stay: Payer: Medicare Other

## 2019-06-29 DIAGNOSIS — G894 Chronic pain syndrome: Secondary | ICD-10-CM

## 2019-06-29 LAB — CBC WITH DIFFERENTIAL/PLATELET
Abs Immature Granulocytes: 0.05 10*3/uL (ref 0.00–0.07)
Basophils Absolute: 0 10*3/uL (ref 0.0–0.1)
Basophils Relative: 0 %
Eosinophils Absolute: 0 10*3/uL (ref 0.0–0.5)
Eosinophils Relative: 0 %
HCT: 28.1 % — ABNORMAL LOW (ref 36.0–46.0)
Hemoglobin: 8.5 g/dL — ABNORMAL LOW (ref 12.0–15.0)
Immature Granulocytes: 1 %
Lymphocytes Relative: 1 %
Lymphs Abs: 0.1 10*3/uL — ABNORMAL LOW (ref 0.7–4.0)
MCH: 28.9 pg (ref 26.0–34.0)
MCHC: 30.2 g/dL (ref 30.0–36.0)
MCV: 95.6 fL (ref 80.0–100.0)
Monocytes Absolute: 0.5 10*3/uL (ref 0.1–1.0)
Monocytes Relative: 5 %
Neutro Abs: 8 10*3/uL — ABNORMAL HIGH (ref 1.7–7.7)
Neutrophils Relative %: 93 %
Platelets: 114 10*3/uL — ABNORMAL LOW (ref 150–400)
RBC: 2.94 MIL/uL — ABNORMAL LOW (ref 3.87–5.11)
RDW: 19.7 % — ABNORMAL HIGH (ref 11.5–15.5)
WBC: 8.6 10*3/uL (ref 4.0–10.5)
nRBC: 0 % (ref 0.0–0.2)

## 2019-06-29 LAB — CREATININE, SERUM
Creatinine, Ser: 0.48 mg/dL (ref 0.44–1.00)
GFR calc Af Amer: >60 mL/min (ref >60)
GFR calc non Af Amer: >60 mL/min (ref >60)

## 2019-06-29 LAB — C-REACTIVE PROTEIN: CRP: 24.4 mg/dL — ABNORMAL HIGH (ref <1.0)

## 2019-06-29 MED ORDER — MORPHINE SULFATE (PF) 2 MG/ML IV SOLN
2.0000 mg | INTRAVENOUS | Status: DC | PRN
  Administered 2019-06-29 – 2019-07-01 (×13): 2 mg via INTRAVENOUS
  Filled 2019-06-29 (×13): qty 1

## 2019-06-29 MED ORDER — LORAZEPAM 0.5 MG PO TABS: 1.0000 mg | ORAL_TABLET | Freq: Two times a day (BID) | ORAL | Status: DC | PRN

## 2019-06-29 MED ORDER — CLONAZEPAM 1 MG PO TABS
1.0000 mg | ORAL_TABLET | Freq: Once | ORAL | Status: AC
  Administered 2019-06-29: 19:00:00 1 mg via ORAL

## 2019-06-29 MED ORDER — PRO-STAT SUGAR FREE PO LIQD
30.0000 mL | Freq: Three times a day (TID) | ORAL | Status: DC
  Administered 2019-06-29 – 2019-07-01 (×5): 30 mL via ORAL
  Filled 2019-06-29 (×4): qty 30

## 2019-06-29 NOTE — Progress Notes (Signed)
NAME:  Susan Houston, MRN:  [id number], DOB:  12-06-1960, LOS: 7 ADMISSION DATE:  07/16/2019, CONSULTATION DATE:  06/24/2019 REFERRING MD:  Bonner Puna, CHIEF COMPLAINT:  COVID 71 pneumonia   Brief History   58 year old female diagnosed with COVID-19 pneumonia on September 4, has underlying history of recurrent squamous cell carcinoma of the lung.  Transferred to [facility] for further management of hypoxemic respiratory failure.  Past Medical History  Squamous cell carcinoma of right lower lobe, status post surgical resection in July 2019.  Mild cognitive impairment.  Laryngeal cancer 2009.  Hyperlipidemia.  Kidney stones.  GERD.  Diverticulitis.  COPD, on 2 L nasal cannula at night.  Anxiety.  Significant Hospital Events   Transferred to intensive care unit and received convalescent plasma on September 5  Consults:  PCCM  Procedures:    Significant Diagnostic Tests:    Micro Data:  September 1 SARS-CoV-2 positive  Antimicrobials:  Remdesivir 9/2 >  9/6 Solumedrol 9/2 >   Interim history/subjective:   Slept well Has mor energy today Attempted to change to [place] high flow nasal cannula: could not maintain O2 saturation in reasonable range  Objective   Blood pressure 124/66, pulse 95, temperature (!) 97.5 F (36.4 C), temperature source Axillary, resp. rate (!) 22, height 5\' 2"  (1.575 m), weight 87.3 kg, SpO2 94 %.    FiO2 (%):  [70 %-80 %] 70 %   Intake/Output Summary (Last 24 hours) at 06/29/2019 0754 Last data filed at 06/29/2019 0645 Gross per 24 hour  Intake 1260 ml  Output 1401 ml  Net -141 ml   Filed Weights   06/30/2019 0501 06/25/19 0120 06/28/19 0418  Weight: 85.9 kg 87.7 kg 87.3 kg    Examination:  General:  Resting comfortably in bed HENT: NCAT OP clear PULM: Crackles bases, normal effort CV: RRR, no mgr GI: BS+, soft, nontender MSK: normal bulk and tone Neuro: awake, alert, no distress, MAEW   9/8 CXR reviewed: left lung infiltrate, now ? New  R mid lung infiltrate  Resolved Hospital Problem list     Assessment & Plan:  ARDS from COVID-19 pneumonia: severe hypoxemia, stable oxygenation overnight; complicated by chest congestion Continue heated high flow O2 in ICU environment for close monitoring Continue flutter valve Tolerate periods of hypoxemia, goal at rest is greater than 85% SaO2, with movement ideally above 75% Decision for intubation should be based on a change in mental status or physical evidence of ventilatory failure such as nasal flaring, accessory muscle use, paradoxical breathing Out of bed to chair as able Incentive spirometry is important, use every hour Prone positioning while in bed Plan 10 days of systemic steroids, adjusted dose of methylprednisolone on 9/19 (0.5mg /kg)  COPD: not clearly in exacerbation combivent QID  Metastatic lung cancer, recently treated with chemotherapy and radiation Monitor imaging  Deconditioning Out of bed Range of motion exercises PT/OT   Best practice:  Diet: regular diet Pain/Anxiety/Delirium protocol (if indicated): n/a VAP protocol (if indicated): n/a DVT prophylaxis: lovenox GI prophylaxis: Pantoprazole for stress ulcer prophylaxis Glucose control: SSI Mobility: out of bed to chair Code Status: FULL, if intubated would not want more than 1 week of mechanical ventilation Family Communication: I called her husband today, see discussion above Disposition:   Labs   CBC: Recent Labs  Lab 06/25/19 0435 06/26/19 0450 06/27/19 0500 06/28/19 0400 06/29/19 0524  WBC 7.5 7.6 8.8 15.6* 8.6  NEUTROABS 6.8 7.0 8.1* 14.3* 8.0*  HGB 9.5* 8.6* 9.6* 9.9* 8.5*  HCT 29.6* 27.3* 30.8* 31.8* 28.1*  MCV 96.1 97.2 96.9 95.8 95.6  PLT 120* 124* 146* 157 114*    Basic Metabolic Panel: Recent Labs  Lab 06/24/19 0358  06/25/19 0031 06/25/19 0435 06/26/19 0450 06/27/19 0500 06/28/19 0400 06/29/19 0519  NA 139   < > 138 138 137 136 133*  --   K 4.3   < > 3.9 3.9 4.5  4.3 4.6  --   CL 99  --   --  92* 99 98 92*  --   CO2 30  --   --  34* 30 30 30   --   GLUCOSE 99  --   --  153* 126* 138* 157*  --   BUN 19  --   --  20 23* 22* 28*  --   CREATININE 0.63  --   --  0.62 0.57 0.63 0.82 0.48  CALCIUM 8.8*  --   --  9.1 8.3* 8.5* 8.7*  --    < > = values in this interval not displayed.   GFR: Estimated Creatinine Clearance: 77.7 mL/min (by C-G formula based on SCr of 0.48 mg/dL). Recent Labs  Lab 06/25/19 0435 06/26/19 0450 06/27/19 0500 06/28/19 0400 06/29/19 0524  PROCALCITON <0.10 <0.10 <0.10  --   --   WBC 7.5 7.6 8.8 15.6* 8.6    Liver Function Tests: Recent Labs  Lab 06/24/19 0358 06/25/19 0435 06/26/19 0450 06/27/19 0500 06/28/19 0400  AST 11* 13* 12* 13* 19  ALT 10 12 12 13 17   ALKPHOS 80 84 78 92 88  BILITOT 0.5 0.5 0.5 0.7 0.9  PROT 6.8 7.3 6.5 6.7 6.6  ALBUMIN 2.9* 3.1* 2.7* 3.0* 2.8*   No results for input(s): LIPASE, AMYLASE in the last 168 hours. No results for input(s): AMMONIA in the last 168 hours.  ABG    Component Value Date/Time   PHART 7.434 06/25/2019 0031   PCO2ART 44.8 06/25/2019 0031   PO2ART 61.0 (L) 06/25/2019 0031   HCO3 30.2 (H) 06/25/2019 0031   TCO2 32 06/25/2019 0031   O2SAT 92.0 06/25/2019 0031     Coagulation Profile: No results for input(s): INR, PROTIME in the last 168 hours.  Cardiac Enzymes: No results for input(s): CKTOTAL, CKMB, CKMBINDEX, TROPONINI in the last 168 hours.  HbA1C: No results found for: HGBA1C  CBG: No results for input(s): GLUCAP in the last 168 hours.    Critical care time: 35 minutes    Roselie Awkward, MD [facility] Pager: [phone] Cell: [phone] If no response, call [phone]

## 2019-06-29 NOTE — Progress Notes (Signed)
[facility]  I called Tharon Aquas, her husband for an update.  Questions answered.  Roselie Awkward, MD [place] [facility] Pager: [phone] Cell: [phone] If no response, call [phone]

## 2019-06-29 NOTE — Progress Notes (Signed)
 Nutrition Follow-up  DOCUMENTATION CODES:   Obesity unspecified  INTERVENTION:   Continue Regular diet, add Chopped meats. Pt declined downgrading diet to Dysphagia III/Mechanical soft at this time  D/C Ensure Enlive  Add Chocolate Milk TID and [place] Yogurt TID to meal trays  Trial of 30 ml Prostat TID, each supplement provides 100 kcals and 15 grams protein.   Pt receiving Hormel Shake daily with Breakfast which provides 520 kcals and 22 g of protein and Magic cup BID with lunch and dinner, each supplement provides 290 kcal and 9 grams of protein, automatically on meal trays to optimize nutritional intake.   Encourage po intake and oral nutrition supplements   NUTRITION DIAGNOSIS:   Increased nutrient needs related to cancer and cancer related treatments, acute illness as evidenced by estimated needs.  Being addressed via above  GOAL:   Patient will meet greater than or equal to 90% of their needs  Progressing  MONITOR:   Supplement acceptance, PO intake, Labs, Weight trends  REASON FOR ASSESSMENT:   Malnutrition Screening Tool    ASSESSMENT:   58 yo female admitted with acute on chronic respiratory failure due to COVID-19 pneumonia; high risk for progression to ARDS. PMH includes metastatic squamous cell lung cancer 2019 s/p resection and recurrence 2020 undergoing chemo and radiation; laryngeal cancer s/p chemo and radiation in 2009. Additional hx includes chronic respiratory failure, COPD, depression, chronic pain  9/03 Admit 9/05 Transferred to ICU, received Covalescent Plasma  Pt requiring 15L HFNC  Limited documentation of po intake; RN today reports pt eating well. Pt denies problems swallowing, some difficulty chewing as pt is edentulous. Discussed option of downgrading diet to Dysphagia III/Mechanical Soft with pt and RN; pt declines stating most of the foods are ok. Plan to continue Regular diet but will change to chopped meats.   Pt does not like  Ensure Enlive; declines other oral nutrition supplements like Boost Plus, Booze Breeze. Pt does want Chocolate Milk and Greek Yogurt on meal trays  MASD present but no other skin breakdown noted per RN assessment  Weight relatively stable since admission; admit 85.9 kg; current wt 87.3 kg. Net negative per I/O flow sheet  Labs: reviewed Meds: reviewed  Diet Order:   Diet Order            Diet regular Room service appropriate? Yes; Fluid consistency: Thin  Diet effective now              EDUCATION NEEDS:   Not appropriate for education at this time  Skin:  Skin Assessment: Skin Integrity Issues: Skin Integrity Issues:: Other (Comment) Other: MASD to abdomen and groin  Last BM:  9/10  Height:   Ht Readings from Last 1 Encounters:  06/30/2019 5\' 2"  (1.575 m)    Weight:   Wt Readings from Last 1 Encounters:  06/28/19 87.3 kg    Ideal Body Weight:  50 kg  BMI:  Body mass index is 35.2 kg/m.  Estimated Nutritional Needs:   Kcal:  1900-2100 kcals  Protein:  95-110 g  Fluid:  >/= 1.9 L   Susan Barillas MS, RDN, LDN, CNSC [phone] Pager  [phone] Weekend/On-Call Pager

## 2019-06-29 NOTE — Progress Notes (Signed)
[facility] PROGRESS NOTE    Progress Note  Susan Houston  ZOX:[id number] DOB: 1961/06/09 DOA: 06/27/2019 PCP: Valerie Roys, DO     Brief Narrative:   Susan Houston is an 58 y.o. female past medical history of CAD, COPD 3 L oxygen dependent, metastatic squamous cell carcinoma diagnosed in 2019, status post resection with recurrence in 2020 undergoing chemo and radiation therapy. She is also status post radiation therapy due to related laryngeal cancer in 2019, depression, chronic pain who presents to the ED on 06/21/2019 with increased shortness of breath and cough, chest x-ray showed bilateral patchy infiltrates in her SARS-CoV-2 test was positive, she was requiring 6 L of oxygen to keep saturation greater than 90%. On admission her CRP was elevated at 19 with a normal procalcitonin. She was started on IV steroids and Remdesivir.  Repeated chest x-ray showed progressive left greater than right airspace opacity, she was given convalescent plasma and transferred to the ICU and PCCM was consulted.  Assessment/Plan:   Acute respiratory disease due to COVID-19 virus declined 19 viral pneumonia: Patient now wants to be intubated at least for short period of time. She is felt to be high risk for ARDS and she has poor functional status. He has completed a course of IV Remdesivir continue steroids for total of 10 days. She was given convalescent plasma on 06/24/2019. Is requiring 15 L of high flow nasal cannula to keep saturation above 94%. Appreciate PCCM's assistance, continue intermediate dose of Lovenox. Leukocytosis improved.  Right lower lobe metastatic squamous cell cancer: Not a candidate for chemotherapy while in house.  Will need to follow-up with oncology as an outpatient.   COPD: Continue current regimen.  Major depressive disorder/general anxiety disorder: Continue SSRI and clonazepam.  Chronic pain due to malignancy associated metastatic disease: Continue Lyrica, increase  morphine and put it more frequently.  Chemotherapy-induced pancytopenia/symptomatic anemia: She is status post 1 unit packed red blood cells on 06/30/2019. Hemoglobin has remained stable.  Constipation: Continue current bowel regimen.  Candidal intertrigo: Continue nystatin.  Opioid-induced constipation: Continue current regimen.  DVT prophylaxis: lovenxo Family Communication:none Disposition Plan/Barrier to D/C: unable to determine Code Status:     Code Status Orders  (From admission, onward)         Start     Ordered   07/01/2019 0221  Full code  Continuous     07/09/2019 0220        Code Status History    Date Active Date Inactive Code Status Order ID Comments User Context   06/26/2019 0220 07/19/2019 0220 Full Code [id number]  Kristopher Oppenheim, DO Inpatient   11/29/2018 1108 12/02/2018 1743 Full Code [id number]  Hessie Knows, MD Inpatient   05/16/2018 1126 05/20/2018 1644 Full Code [id number]  Nestor Lewandowsky, MD Inpatient   09/24/2014 1653 09/25/2014 2132 Full Code [id number]  Pricilla Loveless, PA-C Inpatient   Advance Care Planning Activity    Advance Directive Documentation     Most Recent Value  Type of Advance Directive  Living will  Pre-existing out of facility DNR order (yellow form or pink MOST form)  -  "MOST" Form in Place?  -        IV Access:    Peripheral IV   Procedures and diagnostic studies:   No results found.   Medical Consultants:    None.  Anti-Infectives:    Subjective:    Susan Houston is in a better mood today, she relates she feels more comfortable,  she relates her pain is controlled but needs it more frequently as towards the end of her cycle her pain starts coming back.  Objective:    Vitals:   06/29/19 0400 06/29/19 0500 06/29/19 0600 06/29/19 0700  BP: 115/69 111/64 115/67 124/66  Pulse: 98 90 88 95  Resp: 19 16 14  (!) 22  Temp:      TempSrc:      SpO2: 97% 98% 95% 94%  Weight:      Height:       SpO2: 94 % O2 Flow Rate  (L/min): 15 L/min FiO2 (%): 70 %   Intake/Output Summary (Last 24 hours) at 06/29/2019 0753 Last data filed at 06/29/2019 0645 Gross per 24 hour  Intake 1260 ml  Output 1401 ml  Net -141 ml   Filed Weights   07/01/2019 0501 06/25/19 0120 06/28/19 0418  Weight: 85.9 kg 87.7 kg 87.3 kg    Exam: General exam: In no acute distress. Respiratory system: Good air movement and crackles B/L Cardiovascular system: S1 & S2 heard, RRR. No JVD. Gastrointestinal system: Abdomen is nondistended, soft and nontender.  Central nervous system: Alert and oriented. No focal neurological deficits. Extremities: No pedal edema. Skin: No rashes, lesions or ulcers Psychiatry: Judgement and insight appear normal. Mood & affect appropriate.    Data Reviewed:    Labs: Basic Metabolic Panel: Recent Labs  Lab 06/24/19 0358  06/25/19 0031 06/25/19 0435 06/26/19 0450 06/27/19 0500 06/28/19 0400 06/29/19 0519  NA 139   < > 138 138 137 136 133*  --   K 4.3   < > 3.9 3.9 4.5 4.3 4.6  --   CL 99  --   --  92* 99 98 92*  --   CO2 30  --   --  34* 30 30 30   --   GLUCOSE 99  --   --  153* 126* 138* 157*  --   BUN 19  --   --  20 23* 22* 28*  --   CREATININE 0.63  --   --  0.62 0.57 0.63 0.82 0.48  CALCIUM 8.8*  --   --  9.1 8.3* 8.5* 8.7*  --    < > = values in this interval not displayed.   GFR Estimated Creatinine Clearance: 77.7 mL/min (by C-G formula based on SCr of 0.48 mg/dL). Liver Function Tests: Recent Labs  Lab 06/24/19 0358 06/25/19 0435 06/26/19 0450 06/27/19 0500 06/28/19 0400  AST 11* 13* 12* 13* 19  ALT 10 12 12 13 17   ALKPHOS 80 84 78 92 88  BILITOT 0.5 0.5 0.5 0.7 0.9  PROT 6.8 7.3 6.5 6.7 6.6  ALBUMIN 2.9* 3.1* 2.7* 3.0* 2.8*   No results for input(s): LIPASE, AMYLASE in the last 168 hours. No results for input(s): AMMONIA in the last 168 hours. Coagulation profile No results for input(s): INR, PROTIME in the last 168 hours. COVID-19 Labs  Recent Labs    06/27/19  0500 06/28/19 0400 06/29/19 0519  DDIMER 1.23* 2.20*  --   CRP 3.5* 17.4* 24.4*    Lab Results  Component Value Date   SARSCOV2NAA POSITIVE (A) 06/20/2019   SARSCOV2NAA NEGATIVE 04/25/2019   SARSCOV2NAA NOT DETECTED 03/31/2019   SARSCOV2NAA NOT DETECTED 03/16/2019    CBC: Recent Labs  Lab 06/25/19 0435 06/26/19 0450 06/27/19 0500 06/28/19 0400 06/29/19 0524  WBC 7.5 7.6 8.8 15.6* 8.6  NEUTROABS 6.8 7.0 8.1* 14.3* 8.0*  HGB 9.5* 8.6* 9.6* 9.9* 8.5*  HCT  29.6* 27.3* 30.8* 31.8* 28.1*  MCV 96.1 97.2 96.9 95.8 95.6  PLT 120* 124* 146* 157 114*   Cardiac Enzymes: No results for input(s): CKTOTAL, CKMB, CKMBINDEX, TROPONINI in the last 168 hours. BNP (last 3 results) No results for input(s): PROBNP in the last 8760 hours. CBG: No results for input(s): GLUCAP in the last 168 hours. D-Dimer: Recent Labs    06/27/19 0500 06/28/19 0400  DDIMER 1.23* 2.20*   Hgb A1c: No results for input(s): HGBA1C in the last 72 hours. Lipid Profile: No results for input(s): CHOL, HDL, LDLCALC, TRIG, CHOLHDL, LDLDIRECT in the last 72 hours. Thyroid function studies: No results for input(s): TSH, T4TOTAL, T3FREE, THYROIDAB in the last 72 hours.  Invalid input(s): FREET3 Anemia work up: No results for input(s): VITAMINB12, FOLATE, FERRITIN, TIBC, IRON, RETICCTPCT in the last 72 hours. Sepsis Labs: Recent Labs  Lab 06/25/19 0435 06/26/19 0450 06/27/19 0500 06/28/19 0400 06/29/19 0524  PROCALCITON <0.10 <0.10 <0.10  --   --   WBC 7.5 7.6 8.8 15.6* 8.6   Microbiology Recent Results (from the past 240 hour(s))  SARS CORONAVIRUS 2 (TAT 6-24 HRS) Nasopharyngeal Nasopharyngeal Swab     Status: Abnormal   Collection Time: 06/20/19 11:37 AM   Specimen: Nasopharyngeal Swab  Result Value Ref Range Status   SARS Coronavirus 2 POSITIVE (A) NEGATIVE Final    Comment: (NOTE) SARS-CoV-2 target nucleic acids are DETECTED. The SARS-CoV-2 RNA is generally detectable in upper and lower  respiratory specimens during the acute phase of infection. Positive results are indicative of active infection with SARS-CoV-2. Clinical  correlation with patient history and other diagnostic information is necessary to determine patient infection status. Positive results do  not rule out bacterial infection or co-infection with other viruses. The expected result is Negative. Fact Sheet for Patients: SugarRoll.be Fact Sheet for Healthcare Providers: https://www.woods-mathews.com/ This test is not yet approved or cleared by the [place] FDA and  has been authorized for detection and/or diagnosis of SARS-CoV-2 by FDA under an Emergency Use Authorization (EUA). This EUA will remain  in effect (meaning this test can be used) for the duration of the COVID-19 declaration under Section 564(b)(1) of the Act, 21 U.S.C.  section 360bbb-3(b)(1), unless the authorization is terminated or revoked sooner. Performed at [facility], [place] [address] [place], [place] [address]   MRSA PCR Screening     Status: None   Collection Time: 06/25/19  9:58 AM   Specimen: Nasal Mucosa; Nasopharyngeal  Result Value Ref Range Status   MRSA by PCR NEGATIVE NEGATIVE Final    Comment:        The GeneXpert MRSA Assay (FDA approved for NASAL specimens only), is one component of a comprehensive MRSA colonization surveillance program. It is not intended to diagnose MRSA infection nor to guide or monitor treatment for MRSA infections. Performed at [facility], [place] [address], [place], [place] [address]   Expectorated sputum assessment w rflx to resp cult     Status: None   Collection Time: 06/27/19  8:19 PM   Specimen: Sputum  Result Value Ref Range Status   Specimen Description SPU  Final   Special Requests Immunocompromised  Final   Sputum evaluation   Final    THIS SPECIMEN IS ACCEPTABLE FOR SPUTUM CULTURE Performed at [facility], [place] [address] [place], [place] [address]    Report Status 06/27/2019 FINAL  Final  Culture, respiratory     Status: None (Preliminary result)  Collection Time: 06/27/19  8:19 PM   Specimen: Sputum  Result Value Ref Range Status   Specimen Description   Final    SPU Performed at [place] [address], [place], [place] [address]    Special Requests   Final    Immunocompromised Reflexed from [phone] Performed at [facility], [place] [address], [place], [place] [address]    Gram Stain   Final    MODERATE WBC PRESENT, PREDOMINANTLY PMN MODERATE GRAM NEGATIVE RODS FEW GRAM POSITIVE COCCI IN CLUSTERS Performed at [facility], [place] [address], [place], [place] [address]    Culture PENDING  Incomplete   Report Status PENDING  Incomplete     Medications:   . sodium chloride   Intravenous Once  . aerochamber plus with mask  1 each Other Once  . aspirin EC  81 mg Oral Daily  . benzonatate  200 mg Oral TID  . Chlorhexidine Gluconate Cloth  6 each Topical Daily  . enoxaparin (LOVENOX) injection  40 mg Subcutaneous Q12H  . feeding supplement (ENSURE ENLIVE)  237 mL Oral TID BM  . FLUoxetine  80 mg Oral Daily  . Ipratropium-Albuterol  1 puff Inhalation TID  . methylPREDNISolone (SOLU-MEDROL) injection  20 mg Intravenous Q12H  . nystatin   Topical TID  . pantoprazole  40 mg Oral Daily  . polyethylene glycol  17 g Oral BID  . [START ON 06/30/2019] polyethylene glycol  17 g Oral Daily  . pregabalin  75 mg Oral TID  . senna-docusate  2 tablet Oral BID  . sucralfate  1 g Oral TID  . Suvorexant  20 mg Oral QHS   Continuous Infu   LOS: 7 days   Charlynne Cousins  [facility]  06/29/2019, 7:53 AM

## 2019-06-29 NOTE — Plan of Care (Signed)
Pt had a good day, anxiety was well managed.  Tolerated bathing and linen change.  Purewick works well.

## 2019-06-29 NOTE — Plan of Care (Signed)
O2 sats holding within parameters with light activity in bed.  Coughing episodes this shift is minimal.  PRNmeds foranxiety to assit with breathing as well.  Currently on 15l HFNC.  Pt moving in bed with minimal assistance from nurse.    Problem: Education: Goal: Knowledge of General Education information will improve Description: Including pain rating scale, medication(s)/side effects and non-pharmacologic comfort measures Outcome: Progressing   Problem: Health Behavior/Discharge Planning: Goal: Ability to manage health-related needs will improve Outcome: Progressing   Problem: Clinical Measurements: Goal: Ability to maintain clinical measurements within normal limits will improve Outcome: Progressing Goal: Will remain free from infection Outcome: Progressing Goal: Diagnostic test results will improve Outcome: Progressing Goal: Respiratory complications will improve Outcome: Progressing Goal: Cardiovascular complication will be avoided Outcome: Progressing   Problem: Activity: Goal: Risk for activity intolerance will decrease Outcome: Progressing   Problem: Nutrition: Goal: Adequate nutrition will be maintained Outcome: Progressing   Problem: Coping: Goal: Level of anxiety will decrease Outcome: Progressing   Problem: Elimination: Goal: Will not experience complications related to bowel motility Outcome: Progressing Goal: Will not experience complications related to urinary retention Outcome: Progressing   Problem: Pain Managment: Goal: General experience of comfort will improve Outcome: Progressing   Problem: Safety: Goal: Ability to remain free from injury will improve Outcome: Progressing   Problem: Skin Integrity: Goal: Risk for impaired skin integrity will decrease Outcome: Progressing   Problem: Education: Goal: Knowledge of risk factors and measures for prevention of condition will improve Outcome: Progressing   Problem: Coping: Goal: Psychosocial and  spiritual needs will be supported Outcome: Progressing   Problem: Respiratory: Goal: Will maintain a patent airway Outcome: Progressing Goal: Complications related to the disease process, condition or treatment will be avoided or minimized Outcome: Progressing

## 2019-06-30 LAB — CBC WITH DIFFERENTIAL/PLATELET
Abs Immature Granulocytes: 0.18 10*3/uL — ABNORMAL HIGH (ref 0.00–0.07)
Basophils Absolute: 0 10*3/uL (ref 0.0–0.1)
Basophils Relative: 0 %
Eosinophils Absolute: 0 10*3/uL (ref 0.0–0.5)
Eosinophils Relative: 0 %
HCT: 30.5 % — ABNORMAL LOW (ref 36.0–46.0)
Hemoglobin: 9.5 g/dL — ABNORMAL LOW (ref 12.0–15.0)
Immature Granulocytes: 2 %
Lymphocytes Relative: 2 %
Lymphs Abs: 0.2 10*3/uL — ABNORMAL LOW (ref 0.7–4.0)
MCH: 29.7 pg (ref 26.0–34.0)
MCHC: 31.1 g/dL (ref 30.0–36.0)
MCV: 95.3 fL (ref 80.0–100.0)
Monocytes Absolute: 0.6 10*3/uL (ref 0.1–1.0)
Monocytes Relative: 6 %
Neutro Abs: 9.5 10*3/uL — ABNORMAL HIGH (ref 1.7–7.7)
Neutrophils Relative %: 90 %
Platelets: 160 10*3/uL (ref 150–400)
RBC: 3.2 MIL/uL — ABNORMAL LOW (ref 3.87–5.11)
RDW: 19.5 % — ABNORMAL HIGH (ref 11.5–15.5)
WBC: 10.4 10*3/uL (ref 4.0–10.5)
nRBC: 0.2 % (ref 0.0–0.2)

## 2019-06-30 LAB — CULTURE, RESPIRATORY W GRAM STAIN

## 2019-06-30 LAB — C-REACTIVE PROTEIN: CRP: 27.2 mg/dL — ABNORMAL HIGH (ref <1.0)

## 2019-06-30 MED ORDER — ETOMIDATE 2 MG/ML IV SOLN
INTRAVENOUS | Status: AC
  Filled 2019-06-30: qty 20

## 2019-06-30 MED ORDER — SALINE SPRAY 0.65 % NA SOLN
1.0000 | NASAL | Status: DC | PRN
  Administered 2019-06-30: 1 via NASAL
  Filled 2019-06-30: qty 44

## 2019-06-30 MED ORDER — DRONABINOL 2.5 MG PO CAPS
2.5000 mg | ORAL_CAPSULE | Freq: Every day | ORAL | Status: DC
  Administered 2019-07-01: 13:00:00 2.5 mg via ORAL
  Filled 2019-06-30: qty 1

## 2019-06-30 MED ORDER — FENTANYL CITRATE (PF) 100 MCG/2ML IJ SOLN
INTRAMUSCULAR | Status: AC
  Filled 2019-06-30: qty 2

## 2019-06-30 MED ORDER — MIDAZOLAM HCL 2 MG/2ML IJ SOLN
INTRAMUSCULAR | Status: AC
  Filled 2019-06-30: qty 4

## 2019-06-30 MED ORDER — ROCURONIUM BROMIDE 10 MG/ML (PF) SYRINGE
PREFILLED_SYRINGE | INTRAVENOUS | Status: AC
  Filled 2019-06-30: qty 10

## 2019-06-30 MED ORDER — OXYMETAZOLINE HCL 0.05 % NA SOLN
1.0000 | Freq: Two times a day (BID) | NASAL | Status: DC
  Administered 2019-06-30 – 2019-07-01 (×2): 1 via NASAL
  Filled 2019-06-30: qty 15

## 2019-06-30 MED ORDER — LIP MEDEX EX OINT
TOPICAL_OINTMENT | CUTANEOUS | Status: DC | PRN
  Administered 2019-06-30: 12:00:00 via TOPICAL
  Administered 2019-06-30: 1 via TOPICAL
  Filled 2019-06-30 (×3): qty 7

## 2019-06-30 NOTE — Progress Notes (Signed)
[facility] PROGRESS NOTE    Progress Note  Susan Houston  DDU:[id number] DOB: 05-Jun-1961 DOA: 06/21/2019 PCP: Valerie Roys, DO     Brief Narrative:   Susan Houston is an 58 y.o. female past medical history of CAD, COPD 3 L oxygen dependent, metastatic squamous cell carcinoma diagnosed in 2019, status post resection with recurrence in 2020 undergoing chemo and radiation therapy. She is also status post radiation therapy due to related laryngeal cancer in 2019, depression, chronic pain who presents to the ED on 06/21/2019 with increased shortness of breath and cough, chest x-ray showed bilateral patchy infiltrates in her SARS-CoV-2 test was positive, she was requiring 6 L of oxygen to keep saturation greater than 90%. On admission her CRP was elevated at 19 with a normal procalcitonin. She was started on IV steroids and Remdesivir.  Repeated chest x-ray showed progressive left greater than right airspace opacity, she was given convalescent plasma and transferred to the ICU and PCCM was consulted.  Significant events: Transferred to the ICU on 06/27/2019.   COVID-19 medications: IV Remdesivir for 5 days completed IV steroids for 10 days She received 2 doses of Actemra Received convalescent plasma 06/24/2019.  Assessment/Plan:   Acute respiratory disease due to COVID-19 virus declined 19 viral pneumonia: Patient now wants to be intubated at least for short period of time. She is a poor functional status she is felt to be high risk for complications and ARDS. We will start a wean down steroids. She is requiring FiO2 of 60% of heated high flow to keep saturations above 90. Patient is unable to prone. The patient after discussing with her husband would like to be a full code. She relates continues to have a persistent cough that is bothering her continue Tessalon Perles, guaifenesin  Right lower lobe metastatic squamous cell cancer: Not a candidate for chemotherapy while in house, will  need to follow-up with oncology as an outpatient.  COPD: Continue current regimen.  Major depressive disorder/general anxiety disorder: Continue SSRI and clonazepam.  Chronic pain due to malignancy associated metastatic disease: Continue Lyrica, increase morphine and put it more frequently.  Chemotherapy-induced pancytopenia/symptomatic anemia: She is status post 1 unit packed red blood cells on 07/10/2019. Hemoglobin has remained stable.  Constipation: Continue current bowel regimen.  Candidal intertrigo: Continue nystatin.  Opioid-induced constipation: Continue current regimen.  Sinus tachycardia: Likely due to infectious etiology and pain.  DVT prophylaxis: lovenxo Family Communication:none Disposition Plan/Barrier to D/C: unable to determine Code Status:     Code Status Orders  (From admission, onward)         Start     Ordered   07/15/2019 0221  Full code  Continuous     06/21/2019 0220        Code Status History    Date Active Date Inactive Code Status Order ID Comments User Context   07/18/2019 0220 07/08/2019 0220 Full Code [id number]  Kristopher Oppenheim, DO Inpatient   11/29/2018 1108 12/02/2018 1743 Full Code [id number]  Hessie Knows, MD Inpatient   05/16/2018 1126 05/20/2018 1644 Full Code [id number]  Nestor Lewandowsky, MD Inpatient   09/24/2014 1653 09/25/2014 2132 Full Code [id number]  Pricilla Loveless, PA-C Inpatient   Advance Care Planning Activity    Advance Directive Documentation     Most Recent Value  Type of Advance Directive  Living will  Pre-existing out of facility DNR order (yellow form or pink MOST form)  --  "MOST" Form in Place?  --  IV Access:    Peripheral IV   Procedures and diagnostic studies:   No results found.   Medical Consultants:    None.  Anti-Infectives:    Subjective:    Susan Houston relates she continues to have a persistent cough that is bothering her.  Objective:    Vitals:   06/30/19 0544 06/30/19 0550  06/30/19 0551 06/30/19 0600  BP:    (!) 151/78  Pulse: (!) 112 (!) 110    Resp: (!) 29 (!) 27  (!) 25  Temp:      TempSrc:      SpO2: (!) 77% 96% 94% 91%  Weight:      Height:       SpO2: 91 % O2 Flow Rate (L/min): 25 L/min FiO2 (%): 50 %   Intake/Output Summary (Last 24 hours) at 06/30/2019 0729 Last data filed at 06/30/2019 0548 Gross per 24 hour  Intake 1320 ml  Output 3450 ml  Net -2130 ml   Filed Weights   06/23/2019 0501 06/25/19 0120 06/28/19 0418  Weight: 85.9 kg 87.7 kg 87.3 kg    Exam: General exam: In no acute distress. Respiratory system: Good air movement and mostly left-sided crackles. Cardiovascular system: S1 & S2 heard, RRR. No JVD. Gastrointestinal system: Abdomen is nondistended, soft and nontender.  Central nervous system: Alert and oriented. No focal neurological deficits. Extremities: No pedal edema. Skin: No rashes, lesions or ulcers Psychiatry: Judgement and insight appear normal. Mood & affect appropriate.    Data Reviewed:    Labs: Basic Metabolic Panel: Recent Labs  Lab 06/24/19 0358  06/25/19 0031 06/25/19 0435 06/26/19 0450 06/27/19 0500 06/28/19 0400 06/29/19 0519  NA 139   < > 138 138 137 136 133*  --   K 4.3   < > 3.9 3.9 4.5 4.3 4.6  --   CL 99  --   --  92* 99 98 92*  --   CO2 30  --   --  34* 30 30 30   --   GLUCOSE 99  --   --  153* 126* 138* 157*  --   BUN 19  --   --  20 23* 22* 28*  --   CREATININE 0.63  --   --  0.62 0.57 0.63 0.82 0.48  CALCIUM 8.8*  --   --  9.1 8.3* 8.5* 8.7*  --    < > = values in this interval not displayed.   GFR Estimated Creatinine Clearance: 77.7 mL/min (by C-G formula based on SCr of 0.48 mg/dL). Liver Function Tests: Recent Labs  Lab 06/24/19 0358 06/25/19 0435 06/26/19 0450 06/27/19 0500 06/28/19 0400  AST 11* 13* 12* 13* 19  ALT 10 12 12 13 17   ALKPHOS 80 84 78 92 88  BILITOT 0.5 0.5 0.5 0.7 0.9  PROT 6.8 7.3 6.5 6.7 6.6  ALBUMIN 2.9* 3.1* 2.7* 3.0* 2.8*   No results for  input(s): LIPASE, AMYLASE in the last 168 hours. No results for input(s): AMMONIA in the last 168 hours. Coagulation profile No results for input(s): INR, PROTIME in the last 168 hours. COVID-19 Labs  Recent Labs    06/28/19 0400 06/29/19 0519 06/30/19 0450  DDIMER 2.20*  --   --   CRP 17.4* 24.4* 27.2*    Lab Results  Component Value Date   SARSCOV2NAA POSITIVE (A) 06/20/2019   [place] NEGATIVE 04/25/2019   SARSCOV2NAA NOT DETECTED 03/31/2019   SARSCOV2NAA NOT DETECTED 03/16/2019    CBC: Recent  Labs  Lab 06/26/19 0450 06/27/19 0500 06/28/19 0400 06/29/19 0524 06/30/19 0450  WBC 7.6 8.8 15.6* 8.6 10.4  NEUTROABS 7.0 8.1* 14.3* 8.0* 9.5*  HGB 8.6* 9.6* 9.9* 8.5* 9.5*  HCT 27.3* 30.8* 31.8* 28.1* 30.5*  MCV 97.2 96.9 95.8 95.6 95.3  PLT 124* 146* 157 114* 160   Cardiac Enzymes: No results for input(s): CKTOTAL, CKMB, CKMBINDEX, TROPONINI in the last 168 hours. BNP (last 3 results) No results for input(s): PROBNP in the last 8760 hours. CBG: No results for input(s): GLUCAP in the last 168 hours. D-Dimer: Recent Labs    06/28/19 0400  DDIMER 2.20*   Hgb A1c: No results for input(s): HGBA1C in the last 72 hours. Lipid Profile: No results for input(s): CHOL, HDL, LDLCALC, TRIG, CHOLHDL, LDLDIRECT in the last 72 hours. Thyroid function studies: No results for input(s): TSH, T4TOTAL, T3FREE, THYROIDAB in the last 72 hours.  Invalid input(s): FREET3 Anemia work up: No results for input(s): VITAMINB12, FOLATE, FERRITIN, TIBC, IRON, RETICCTPCT in the last 72 hours. Sepsis Labs: Recent Labs  Lab 06/25/19 0435 06/26/19 0450 06/27/19 0500 06/28/19 0400 06/29/19 0524 06/30/19 0450  PROCALCITON <0.10 <0.10 <0.10  --   --   --   WBC 7.5 7.6 8.8 15.6* 8.6 10.4   Microbiology Recent Results (from the past 240 hour(s))  SARS CORONAVIRUS 2 (TAT 6-24 HRS) Nasopharyngeal Nasopharyngeal Swab     Status: Abnormal   Collection Time: 06/20/19 11:37 AM   Specimen:  Nasopharyngeal Swab  Result Value Ref Range Status   SARS Coronavirus 2 POSITIVE (A) NEGATIVE Final    Comment: (NOTE) SARS-CoV-2 target nucleic acids are DETECTED. The SARS-CoV-2 RNA is generally detectable in upper and lower respiratory specimens during the acute phase of infection. Positive results are indicative of active infection with SARS-CoV-2. Clinical  correlation with patient history and other diagnostic information is necessary to determine patient infection status. Positive results do  not rule out bacterial infection or co-infection with other viruses. The expected result is Negative. Fact Sheet for Patients: SugarRoll.be Fact Sheet for Healthcare Providers: https://www.woods-mathews.com/ This test is not yet approved or cleared by the [place] FDA and  has been authorized for detection and/or diagnosis of SARS-CoV-2 by FDA under an Emergency Use Authorization (EUA). This EUA will remain  in effect (meaning this test can be used) for the duration of the COVID-19 declaration under Section 564(b)(1) of the Act, 21 U.S.C.  section 360bbb-3(b)(1), unless the authorization is terminated or revoked sooner. Performed at [facility], [place] [address], [place], [place] [address]   MRSA PCR Screening     Status: None   Collection Time: 06/25/19  9:58 AM   Specimen: Nasal Mucosa; Nasopharyngeal  Result Value Ref Range Status   MRSA by PCR NEGATIVE NEGATIVE Final    Comment:        The GeneXpert MRSA Assay (FDA approved for NASAL specimens only), is one component of a comprehensive MRSA colonization surveillance program. It is not intended to diagnose MRSA infection nor to guide or monitor treatment for MRSA infections. Performed at [facility], [place] [address] [place], [place] [address]   Expectorated sputum assessment w rflx to resp cult     Status: None   Collection Time: 06/27/19  8:19 PM    Specimen: Sputum  Result Value Ref Range Status   Specimen Description SPU  Final   Special Requests Immunocompromised  Final   Sputum evaluation   Final    THIS  SPECIMEN IS ACCEPTABLE FOR SPUTUM CULTURE Performed at [facility], [place] [address], [place], [place] [address]    Report Status 06/27/2019 FINAL  Final  Culture, respiratory     Status: None (Preliminary result)   Collection Time: 06/27/19  8:19 PM   Specimen: Sputum  Result Value Ref Range Status   Specimen Description   Final    SPU Performed at [place] [address] [place], [place] [address]    Special Requests   Final    Immunocompromised Reflexed from [id number] Performed at [facility], [place] [address], [place], [place] [address]    Gram Stain   Final    MODERATE WBC PRESENT, PREDOMINANTLY PMN MODERATE GRAM NEGATIVE RODS FEW GRAM POSITIVE COCCI IN CLUSTERS Performed at [facility], [place] [address] [place], [place] [address]    Culture ABUNDANT ESCHERICHIA COLI  Final   Report Status PENDING  Incomplete     Medications:    sodium chloride   Intravenous Once   aerochamber plus with mask  1 each Other Once   aspirin EC  81 mg Oral Daily   benzonatate  200 mg Oral TID   Chlorhexidine Gluconate Cloth  6 each Topical Daily   enoxaparin (LOVENOX) injection  40 mg Subcutaneous Q12H   feeding supplement (PRO-STAT SUGAR FREE 64)  30 mL Oral TID   FLUoxetine  80 mg Oral Daily   Ipratropium-Albuterol  1 puff Inhalation TID   methylPREDNISolone (SOLU-MEDROL) injection  20 mg Intravenous Q12H   nystatin   Topical TID   pantoprazole  40 mg Oral Daily   polyethylene glycol  17 g Oral Daily   pregabalin  75 mg Oral TID   senna-docusate  2 tablet Oral BID   sucralfate  1 g Oral TID   Suvorexant  20 mg Oral QHS   Continuous Infu   LOS: 8 days   Charlynne Cousins  [facility]  06/30/2019, 7:29 AM

## 2019-06-30 NOTE — Progress Notes (Signed)
Physical Therapy Treatment Patient Details Name: Susan Houston MRN: [id number] DOB: Jul 24, 1961 Today's Date: 06/30/2019    History of Present Illness 58 y.o. female admitted on 07/10/2019 for SOB.  Found to have COVID-19 PNA.  Pt currently undergoing tx for R LL metastatic squamous cell lung CA (s/p resection july 2019), recurrence in 2020 actively getting chemo.  Pt wiht other significant PMH of COPD, chronic pain, laryngeal CA, lumbar facet syndrome, anemia, migraine, uses 2 L O2 [place] at baseline, but recently (for the past 4 weeks has had to use 4 L O2 [place]), bil TKA.      PT Comments    The patient is requiring more  Assistance today, pt. On heated HFNC 60%/15 L, RR23-44 with activity, HR 121-139 with activity, SPO2 68% with transfer(pleth not smoothe), mostly 86%- 95%-variable throughout-finger probe on forehead). BP stable 126/75 after transfer to recliner.  Patient requiring a lot of oxygen to maintain sats. Patient  Does appear weaker today and required more assistance. continue PT.  Follow Up Recommendations  SNF     Equipment Recommendations  None recommended by PT    Recommendations for Other Services       Precautions / Restrictions Precautions Precautions: Fall;Other (comment) Precaution Comments: monitor sats, HR, on heated HF Restrictions Weight Bearing Restrictions: No    Mobility  Bed Mobility Overal bed mobility: Needs Assistance Bed Mobility: Supine to Sit     Supine to sit: Mod assist;HOB elevated     General bed mobility comments: assist with trunk and guide legs  Transfers Overall transfer level: Needs assistance Equipment used: 2 person hand held assist Transfers: Sit to/from [employer] Transfers Sit to Stand: Mod assist;+2 physical assistance;+2 safety/equipment;From elevated surface Stand pivot transfers: Mod assist;+2 physical assistance;+2 safety/equipment       General transfer comment: A to power up, stood x 1 for pericare, then sat down,  stood again to transfer to recliner with shelf arm hold.  Ambulation/Gait                 Stairs             Wheelchair Mobility    Modified Rankin (Stroke Patients Only)       Balance Overall balance assessment: Needs assistance   Sitting balance-Leahy Scale: Fair Sitting balance - Comments: UE suppport   Standing balance support: Bilateral upper extremity supported;During functional activity Standing balance-Leahy Scale: Poor Standing balance comment: requires support  o to stand                            Cognition Arousal/Alertness: Awake/alert Behavior During Therapy: [facility] for tasks assessed/performed                                          Exercises General Exercises - Upper Extremity Shoulder Flexion: AAROM;Both;10 reps Shoulder ABduction: AAROM;Both;10 reps Elbow Flexion: Strengthening;10 reps;Theraband Theraband Level (Elbow Flexion): Level 1 (Yellow) Elbow Extension: Strengthening;Both;10 reps;Theraband Theraband Level (Elbow Extension): Level 1 (Yellow) General Exercises - Lower Extremity Ankle Circles/Pumps: AROM;Both;10 reps Short Arc Quad: AROM;Both;5 reps Heel Slides: AAROM;Both;5 reps Hip ABduction/ADduction: AAROM;Both;5 reps    General Comments        Pertinent Vitals/Pain Pain Assessment: 0-10 Faces Pain Scale: Hurts whole lot Pain Location: shoulder blades Pain Descriptors / Indicators: Aching;Discomfort;Grimacing Pain Intervention(s): Premedicated before session;Repositioned;Heat applied;Monitored during session  Home Living                      Prior Function            PT Goals (current goals can now be found in the care plan section) Progress towards PT goals: Progressing toward goals    Frequency    Min 3X/week      PT Plan Discharge plan needs to be updated    Co-evaluation PT/OT/SLP Co-Evaluation/Treatment: Yes Reason for Co-Treatment: Complexity of the  patient's impairments (multi-system involvement);For patient/therapist safety PT goals addressed during session: Mobility/safety with mobility OT goals addressed during session: ADL's and self-care      AM-PAC PT "6 Clicks" Mobility   Outcome Measure  Help needed turning from your back to your side while in a flat bed without using bedrails?: A Lot Help needed moving from lying on your back to sitting on the side of a flat bed without using bedrails?: A Lot Help needed moving to and from a bed to a chair (including a wheelchair)?: A Lot Help needed standing up from a chair using your arms (e.g., wheelchair or bedside chair)?: A Lot Help needed to walk in hospital room?: Total Help needed climbing 3-5 steps with a railing? : Total 6 Click Score: 10    End of Session Equipment Utilized During Treatment: Oxygen Activity Tolerance: Patient limited by fatigue;Treatment limited secondary to medical complications (Comment) Patient left: in chair;with call bell/phone within reach Nurse Communication: Mobility status PT Visit Diagnosis: Muscle weakness (generalized) (M62.81);Difficulty in walking, not elsewhere classified (R26.2)     Time: 1430-1505 PT Time Calculation (min) (ACUTE ONLY): 35 min  Charges:  $Therapeutic Activity: 8-22 mins                     Tresa Endo PT Acute Rehabilitation Services Pager [phone] Office [phone]    Claretha Cooper 06/30/2019, 3:40 PM

## 2019-06-30 NOTE — Progress Notes (Signed)
Occupational Therapy Treatment Patient Details Name: Susan Houston MRN: [id number] DOB: 10/12/1961 Today's Date: 06/30/2019    History of present illness 58 y.o. female admitted on 07/01/2019 for SOB.  Found to have COVID-19 PNA.  Pt currently undergoing tx for R LL metastatic squamous cell lung CA (s/p resection july 2019), recurrence in 2020 actively getting chemo.  Pt wiht other significant PMH of COPD, chronic pain, laryngeal CA, lumbar facet syndrome, anemia, migraine, uses 2 L O2 [place] at baseline, but recently (for the past 4 weeks has had to use 4 L O2 [place]), bil TKA.     OT comments  Pt required increased assistance with mobility today with what appears to be increased confusion and slower processing. Flat affect, although brightens up when talking about her grand daughter "[name]".  Pt stated that she had been OOB to chair today, however, nurse confirmed that she had not been OOB. Mod A to mobilize to chair after completing pericare EOB with Max A. HR sustained in the 130s, RR 20s - 30s and SpO2 80s-90s on 25L 60% FiO2. Pt complaining of upper back pain between scapula - heat packs applied. May benefit from topical analgesic cream to help with muscular discomfort. Will notify nurse. Continue to follow acutely and recommend [place]. Pt has therabnad, squeeze ball that she needs to be completing daily.   Follow Up Recommendations  Home health OT;Supervision/Assistance - 24 hour    Equipment Recommendations  3 in 1 bedside commode    Recommendations for Other Services      Precautions / Restrictions Precautions Precautions: Fall;Other (comment)(monitor sats) Precaution Comments: monitor sats, HR, on heated HF       Mobility Bed Mobility Overal bed mobility: Needs Assistance Bed Mobility: Supine to Sit     Supine to sit: Mod assist;HOB elevated     General bed mobility comments: assist with trunk and guide legs  Transfers Overall transfer level: Needs assistance Equipment used: 2 person  hand held assist Transfers: Sit to/from [employer] Transfers Sit to Stand: Mod assist;+2 physical assistance;+2 safety/equipment;From elevated surface Stand pivot transfers: Mod assist;+2 physical assistance;+2 safety/equipment       General transfer comment: A to power up, stood x 1 for pericare, then sat down, stood again to transfer to recliner with shelf arm hold.    Balance Overall balance assessment: Needs assistance   Sitting balance-Leahy Scale: Fair Sitting balance - Comments: UE suppport   Standing balance support: Bilateral upper extremity supported;During functional activity Standing balance-Leahy Scale: Poor Standing balance comment: requires support  o to stand                           ADL either performed or assessed with clinical judgement   ADL Overall ADL's : Needs assistance/impaired   Eating/Feeding Details (indicate cue type and reason): poor PO intake Grooming: Set up;Sitting                               Functional mobility during ADLs: Moderate assistance       Vision       Perception     Praxis      Cognition Arousal/Alertness: Awake/alert Behavior During Therapy: Flat affect Overall Cognitive Status: Within Functional Limits for tasks assessed(increased time for processing)  Exercises General Exercises - Upper Extremity Shoulder Flexion: AAROM;Both;10 reps Shoulder ABduction: AAROM;Both;10 reps Elbow Flexion: Strengthening;10 reps;Theraband Theraband Level (Elbow Flexion): Level 1 (Yellow) Elbow Extension: Strengthening;Both;10 reps;Theraband Theraband Level (Elbow Extension): Level 1 (Yellow)   Shoulder Instructions       General Comments      Pertinent Vitals/ Pain       Pain Assessment: 0-10 Pain Score: 8  Faces Pain Scale: Hurts whole lot Pain Location: shoulder blades Pain Descriptors / Indicators: Aching;Discomfort;Grimacing Pain  Intervention(s): Limited activity within patient's tolerance;Repositioned;Heat applied  Home Living                                          Prior Functioning/Environment              Frequency  Min 3X/week        Progress Toward Goals  OT Goals(current goals can now be found in the care plan section)  Progress towards OT goals: Progressing toward goals  Acute Rehab OT Goals Patient Stated Goal: to go home OT Goal Formulation: With patient Time For Goal Achievement: 07/12/19 Potential to Achieve Goals: Good ADL Goals Pt Will Perform Lower Body Bathing: with set-up;sit to/from stand Pt Will Perform Lower Body Dressing: with set-up;sit to/from stand Pt Will Transfer to Toilet: with modified independence;ambulating;bedside commode Pt Will Perform Toileting - Clothing Manipulation and hygiene: with modified independence;sit to/from stand Pt/caregiver will Perform Home Exercise Program: Increased strength;With theraband;Both right and left upper extremity;Independently;With written HEP provided Additional ADL Goal #1: Pt will independently verbalize 3 energy conservaiton strategies  Plan Discharge plan remains appropriate    Co-evaluation    PT/OT/SLP Co-Evaluation/Treatment: Yes Reason for Co-Treatment: Complexity of the patient's impairments (multi-system involvement);To address functional/ADL transfers PT goals addressed during session: Mobility/safety with mobility OT goals addressed during session: ADL's and self-care;Strengthening/ROM      AM-PAC OT "6 Clicks" Daily Activity     Outcome Measure   Help from another person eating meals?: A Little Help from another person taking care of personal grooming?: A Little Help from another person toileting, which includes using toliet, bedpan, or urinal?: A Lot Help from another person bathing (including washing, rinsing, drying)?: A Lot Help from another person to put on and taking off regular upper  body clothing?: A Little Help from another person to put on and taking off regular lower body clothing?: A Lot 6 Click Score: 15    End of Session Equipment Utilized During Treatment: Oxygen(25L; 60% FiO2)  OT Visit Diagnosis: Unsteadiness on feet (R26.81);Muscle weakness (generalized) (M62.81)   Activity Tolerance Patient tolerated treatment well   Patient Left in chair;with call bell/phone within reach   Nurse Communication Mobility status        Time: 1430-1505 OT Time Calculation (min): 35 min  Charges: OT General Charges $OT Visit: 1 Visit OT Treatments $Therapeutic Activity: 8-22 mins  Maurie Boettcher, OT/L   Acute OT Clinical Specialist [place] Pager [phone] Office [phone]    [facility] 06/30/2019, 4:06 PM

## 2019-06-30 NOTE — Progress Notes (Signed)
NAME:  Susan [place], MRN:  [id number], DOB:  04/20/1961, LOS: 72 ADMISSION DATE:  07/05/2019, CONSULTATION DATE:  06/24/2019 REFERRING MD:  Bonner Puna, CHIEF COMPLAINT:  COVID 5 pneumonia   Brief History   58 year old female diagnosed with COVID-19 pneumonia on September 4, has underlying history of recurrent squamous cell carcinoma of the lung.  Transferred to [facility] for further management of hypoxemic respiratory failure.  Past Medical History  Squamous cell carcinoma of right lower lobe, status post surgical resection in July 2019.  Mild cognitive impairment.  Laryngeal cancer 2009.  Hyperlipidemia.  Kidney stones.  GERD.  Diverticulitis.  COPD, on 2 L nasal cannula at night.  Anxiety.  Significant Hospital Events   Transferred to intensive care unit and received convalescent plasma on September 5  Consults:  PCCM  Procedures:    Significant Diagnostic Tests:    Micro Data:  September 1 SARS-CoV-2 positive  Antimicrobials:  Remdesivir 9/2 >  9/6 Solumedrol 9/2 >   Interim history/subjective:   Feels OK Still bothered by cough   Objective   Blood pressure 123/72, pulse (!) 112, temperature 97.9 F (36.6 C), temperature source Oral, resp. rate (!) 30, height 5\' 2"  (1.575 m), weight 87.3 kg, SpO2 91 %.    FiO2 (%):  [50 %-60 %] 60 %   Intake/Output Summary (Last 24 hours) at 06/30/2019 1054 Last data filed at 06/30/2019 0800 Gross per 24 hour  Intake 960 ml  Output 3750 ml  Net -2790 ml   Filed Weights   07/03/2019 0501 06/25/19 0120 06/28/19 0418  Weight: 85.9 kg 87.7 kg 87.3 kg    Examination:  General:  Resting comfortably in bed HENT: NCAT OP clear PULM: Wheezing bilaterally, normal effort CV: RRR, no mgr GI: BS+, soft, nontender MSK: normal bulk and tone Neuro: awake, alert, no distress, MAEW   9/8 CXR reviewed: left lung infiltrate, now ? New R mid lung infiltrate  Resolved Hospital Problem list     Assessment & Plan:  ARDS from COVID-19  pneumonia: severe hypoxemia, stable oxygenation overnight; complicated by chest congestion Continue heated high flow O2 in ICU environment for close monitoring Flutter valve to continue Tolerate periods of hypoxemia, goal at rest is greater than 85% SaO2, with movement ideally above 75% Decision for intubation should be based on a change in mental status or physical evidence of ventilatory failure such as nasal flaring, accessory muscle use, paradoxical breathing Out of bed to chair as able Incentive spirometry is important, use every hour Prone positioning while in bed Plan 10 days of systemic steroids   COPD: not clearly in exacerbation combivent QID  Metastatic lung cancer, recently treated with chemotherapy and radiation Monitor imaging  Deconditioning Out of bed Range of motion exercises PT/OT   Best practice:  Diet: regular diet Pain/Anxiety/Delirium protocol (if indicated): n/a VAP protocol (if indicated): n/a DVT prophylaxis: lovenox GI prophylaxis: Pantoprazole for stress ulcer prophylaxis Glucose control: SSI Mobility: out of bed to chair Code Status: FULL, if intubated would not want more than 1 week of mechanical ventilation Family Communication: I updated her husband 9/11 Disposition:   Labs   CBC: Recent Labs  Lab 06/26/19 0450 06/27/19 0500 06/28/19 0400 06/29/19 0524 06/30/19 0450  WBC 7.6 8.8 15.6* 8.6 10.4  NEUTROABS 7.0 8.1* 14.3* 8.0* 9.5*  HGB 8.6* 9.6* 9.9* 8.5* 9.5*  HCT 27.3* 30.8* 31.8* 28.1* 30.5*  MCV 97.2 96.9 95.8 95.6 95.3  PLT 124* 146* 157 114* 160    Basic  Metabolic Panel: Recent Labs  Lab 06/24/19 0358  06/25/19 0031 06/25/19 0435 06/26/19 0450 06/27/19 0500 06/28/19 0400 06/29/19 0519  NA 139   < > 138 138 137 136 133*  --   K 4.3   < > 3.9 3.9 4.5 4.3 4.6  --   CL 99  --   --  92* 99 98 92*  --   CO2 30  --   --  34* 30 30 30   --   GLUCOSE 99  --   --  153* 126* 138* 157*  --   BUN 19  --   --  20 23* 22* 28*  --    CREATININE 0.63  --   --  0.62 0.57 0.63 0.82 0.48  CALCIUM 8.8*  --   --  9.1 8.3* 8.5* 8.7*  --    < > = values in this interval not displayed.   GFR: Estimated Creatinine Clearance: 77.7 mL/min (by C-G formula based on SCr of 0.48 mg/dL). Recent Labs  Lab 06/25/19 0435 06/26/19 0450 06/27/19 0500 06/28/19 0400 06/29/19 0524 06/30/19 0450  PROCALCITON <0.10 <0.10 <0.10  --   --   --   WBC 7.5 7.6 8.8 15.6* 8.6 10.4    Liver Function Tests: Recent Labs  Lab 06/24/19 0358 06/25/19 0435 06/26/19 0450 06/27/19 0500 06/28/19 0400  AST 11* 13* 12* 13* 19  ALT 10 12 12 13 17   ALKPHOS 80 84 78 92 88  BILITOT 0.5 0.5 0.5 0.7 0.9  PROT 6.8 7.3 6.5 6.7 6.6  ALBUMIN 2.9* 3.1* 2.7* 3.0* 2.8*   No results for input(s): LIPASE, AMYLASE in the last 168 hours. No results for input(s): AMMONIA in the last 168 hours.  ABG    Component Value Date/Time   PHART 7.434 06/25/2019 0031   PCO2ART 44.8 06/25/2019 0031   PO2ART 61.0 (L) 06/25/2019 0031   HCO3 30.2 (H) 06/25/2019 0031   TCO2 32 06/25/2019 0031   O2SAT 92.0 06/25/2019 0031     Coagulation Profile: No results for input(s): INR, PROTIME in the last 168 hours.  Cardiac Enzymes: No results for input(s): CKTOTAL, CKMB, CKMBINDEX, TROPONINI in the last 168 hours.  HbA1C: No results found for: HGBA1C  CBG: No results for input(s): GLUCAP in the last 168 hours.    Critical care time: 32 minutes    Roselie Awkward, MD [place] [facility] Pager: [phone] Cell: [phone] If no response, call [phone]

## 2019-07-01 ENCOUNTER — Inpatient Hospital Stay (HOSPITAL_COMMUNITY): Payer: Medicare Other

## 2019-07-01 LAB — POCT I-STAT 7, (LYTES, BLD GAS, ICA,H+H)
Acid-Base Excess: 9 mmol/L — ABNORMAL HIGH (ref 0.0–2.0)
Bicarbonate: 37 mmol/L — ABNORMAL HIGH (ref 20.0–28.0)
Calcium, Ion: 1.19 mmol/L (ref 1.15–1.40)
HCT: 30 % — ABNORMAL LOW (ref 36.0–46.0)
Hemoglobin: 10.2 g/dL — ABNORMAL LOW (ref 12.0–15.0)
O2 Saturation: 91 %
Patient temperature: 99
Potassium: 5 mmol/L (ref 3.5–5.1)
Sodium: 131 mmol/L — ABNORMAL LOW (ref 135–145)
TCO2: 39 mmol/L — ABNORMAL HIGH (ref 22–32)
pCO2 arterial: 75.1 mmHg (ref 32.0–48.0)
pH, Arterial: 7.302 — ABNORMAL LOW (ref 7.350–7.450)
pO2, Arterial: 70 mmHg — ABNORMAL LOW (ref 83.0–108.0)

## 2019-07-01 LAB — CBC WITH DIFFERENTIAL/PLATELET
Abs Immature Granulocytes: 0.07 10*3/uL (ref 0.00–0.07)
Basophils Absolute: 0 10*3/uL (ref 0.0–0.1)
Basophils Relative: 0 %
Eosinophils Absolute: 0 10*3/uL (ref 0.0–0.5)
Eosinophils Relative: 0 %
HCT: 27.7 % — ABNORMAL LOW (ref 36.0–46.0)
Hemoglobin: 8.5 g/dL — ABNORMAL LOW (ref 12.0–15.0)
Immature Granulocytes: 1 %
Lymphocytes Relative: 1 %
Lymphs Abs: 0.1 10*3/uL — ABNORMAL LOW (ref 0.7–4.0)
MCH: 29.1 pg (ref 26.0–34.0)
MCHC: 30.7 g/dL (ref 30.0–36.0)
MCV: 94.9 fL (ref 80.0–100.0)
Monocytes Absolute: 0.5 10*3/uL (ref 0.1–1.0)
Monocytes Relative: 5 %
Neutro Abs: 10.5 10*3/uL — ABNORMAL HIGH (ref 1.7–7.7)
Neutrophils Relative %: 93 %
Platelets: 132 10*3/uL — ABNORMAL LOW (ref 150–400)
RBC: 2.92 MIL/uL — ABNORMAL LOW (ref 3.87–5.11)
RDW: 18.9 % — ABNORMAL HIGH (ref 11.5–15.5)
WBC: 11.3 10*3/uL — ABNORMAL HIGH (ref 4.0–10.5)
nRBC: 0 % (ref 0.0–0.2)

## 2019-07-01 LAB — PHOSPHORUS: Phosphorus: 5.1 mg/dL — ABNORMAL HIGH (ref 2.5–4.6)

## 2019-07-01 LAB — PROCALCITONIN: Procalcitonin: 3.6 ng/mL

## 2019-07-01 LAB — GLUCOSE, CAPILLARY: Glucose-Capillary: 94 mg/dL (ref 70–99)

## 2019-07-01 LAB — C-REACTIVE PROTEIN: CRP: 38.1 mg/dL — ABNORMAL HIGH (ref <1.0)

## 2019-07-01 LAB — MAGNESIUM: Magnesium: 2 mg/dL (ref 1.7–2.4)

## 2019-07-01 MED ORDER — DEXMEDETOMIDINE HCL IN NACL 400 MCG/100ML IV SOLN
0.0000 ug/kg/h | INTRAVENOUS | Status: DC
  Administered 2019-07-01: 21:00:00 0.6 ug/kg/h via INTRAVENOUS
  Administered 2019-07-01: 17:00:00 0.4 ug/kg/h via INTRAVENOUS
  Filled 2019-07-01 (×2): qty 100

## 2019-07-01 MED ORDER — OXYCODONE HCL ER 10 MG PO T12A: 40.0000 mg | EXTENDED_RELEASE_TABLET | Freq: Two times a day (BID) | ORAL | Status: DC

## 2019-07-01 MED ORDER — SODIUM CHLORIDE 0.9 % IV SOLN
2.0000 g | INTRAVENOUS | Status: DC
  Administered 2019-07-01: 2 g via INTRAVENOUS
  Filled 2019-07-01: qty 20

## 2019-07-01 MED ORDER — ACETAMINOPHEN 160 MG/5ML PO SOLN
650.0000 mg | Freq: Four times a day (QID) | ORAL | Status: DC | PRN
  Administered 2019-07-02: 08:00:00 650 mg
  Filled 2019-07-01 (×2): qty 20.3

## 2019-07-01 MED ORDER — ONDANSETRON HCL 4 MG/2ML IJ SOLN: 4.0000 mg | Freq: Four times a day (QID) | INTRAMUSCULAR | Status: DC | PRN

## 2019-07-01 MED ORDER — IPRATROPIUM-ALBUTEROL 0.5-2.5 (3) MG/3ML IN SOLN
3.0000 mL | Freq: Four times a day (QID) | RESPIRATORY_TRACT | Status: DC
  Administered 2019-07-01 – 2019-07-02 (×3): 3 mL via RESPIRATORY_TRACT
  Filled 2019-07-01 (×3): qty 3

## 2019-07-01 MED ORDER — SODIUM CHLORIDE 0.9% FLUSH
10.0000 mL | Freq: Two times a day (BID) | INTRAVENOUS | Status: DC
  Administered 2019-07-01: 21:00:00 10 mL

## 2019-07-01 MED ORDER — ROCURONIUM BROMIDE 10 MG/ML (PF) SYRINGE
PREFILLED_SYRINGE | INTRAVENOUS | Status: AC
  Administered 2019-07-01: 16:00:00 50 mg
  Filled 2019-07-01: qty 10

## 2019-07-01 MED ORDER — MIDAZOLAM HCL 2 MG/2ML IJ SOLN: 2.0000 mg | INTRAMUSCULAR | Status: DC | PRN

## 2019-07-01 MED ORDER — ORAL CARE MOUTH RINSE
15.0000 mL | OROMUCOSAL | Status: DC
  Administered 2019-07-01 – 2019-07-02 (×8): 15 mL via OROMUCOSAL

## 2019-07-01 MED ORDER — FLUOXETINE HCL 20 MG/5ML PO SOLN
80.0000 mg | Freq: Every day | ORAL | Status: DC
  Filled 2019-07-01 (×2): qty 20

## 2019-07-01 MED ORDER — CHLORHEXIDINE GLUCONATE 0.12% ORAL RINSE (MEDLINE KIT)
15.0000 mL | Freq: Two times a day (BID) | OROMUCOSAL | Status: DC
  Administered 2019-07-01 – 2019-07-02 (×2): 15 mL via OROMUCOSAL

## 2019-07-01 MED ORDER — SUCRALFATE 1 GM/10ML PO SUSP
1.0000 g | Freq: Three times a day (TID) | ORAL | Status: DC
  Administered 2019-07-01: 1 g
  Filled 2019-07-01 (×6): qty 10

## 2019-07-01 MED ORDER — PRO-STAT SUGAR FREE PO LIQD
30.0000 mL | Freq: Two times a day (BID) | ORAL | Status: DC
  Administered 2019-07-01: 21:00:00 30 mL
  Filled 2019-07-01: qty 30

## 2019-07-01 MED ORDER — SENNOSIDES-DOCUSATE SODIUM 8.6-50 MG PO TABS
2.0000 | ORAL_TABLET | Freq: Two times a day (BID) | ORAL | Status: DC
  Administered 2019-07-01: 21:00:00 2
  Filled 2019-07-01: qty 2

## 2019-07-01 MED ORDER — MIDAZOLAM HCL 2 MG/2ML IJ SOLN
INTRAMUSCULAR | Status: AC
  Administered 2019-07-01: 16:00:00 2 mg
  Filled 2019-07-01: qty 4

## 2019-07-01 MED ORDER — VITAL HIGH PROTEIN PO LIQD
1000.0000 mL | ORAL | Status: DC
  Administered 2019-07-01: 18:00:00 1000 mL

## 2019-07-01 MED ORDER — ASPIRIN 81 MG PO CHEW: 81.0000 mg | CHEWABLE_TABLET | Freq: Every day | ORAL | Status: DC

## 2019-07-01 MED ORDER — PANTOPRAZOLE SODIUM 40 MG PO PACK: 40.0000 mg | PACK | Freq: Every day | ORAL | Status: DC

## 2019-07-01 MED ORDER — GUAIFENESIN 100 MG/5ML PO SOLN
15.0000 mL | Freq: Four times a day (QID) | ORAL | Status: DC
  Administered 2019-07-01: 21:00:00 300 mg
  Filled 2019-07-01: qty 45

## 2019-07-01 MED ORDER — FENTANYL BOLUS VIA INFUSION
50.0000 ug | INTRAVENOUS | Status: DC | PRN
  Administered 2019-07-01 (×2): 50 ug via INTRAVENOUS
  Filled 2019-07-01: qty 50

## 2019-07-01 MED ORDER — ONDANSETRON HCL 4 MG PO TABS: 4.0000 mg | ORAL_TABLET | Freq: Four times a day (QID) | ORAL | Status: DC | PRN

## 2019-07-01 MED ORDER — GUAIFENESIN ER 600 MG PO TB12
600.0000 mg | ORAL_TABLET | Freq: Two times a day (BID) | ORAL | Status: DC
  Administered 2019-07-01: 11:00:00 600 mg via ORAL
  Filled 2019-07-01 (×3): qty 1

## 2019-07-01 MED ORDER — METHYLPREDNISOLONE SODIUM SUCC 40 MG IJ SOLR
20.0000 mg | Freq: Every day | INTRAMUSCULAR | Status: DC
  Administered 2019-07-01: 11:00:00 20 mg via INTRAVENOUS
  Filled 2019-07-01: qty 1

## 2019-07-01 MED ORDER — OXYCODONE HCL ER 10 MG PO T12A
20.0000 mg | EXTENDED_RELEASE_TABLET | Freq: Two times a day (BID) | ORAL | Status: DC
  Administered 2019-07-01: 11:00:00 20 mg via ORAL
  Filled 2019-07-01: qty 2

## 2019-07-01 MED ORDER — SODIUM CHLORIDE 0.9 % IV SOLN: 1.0000 g | Freq: Two times a day (BID) | INTRAVENOUS | Status: DC

## 2019-07-01 MED ORDER — FENTANYL CITRATE (PF) 100 MCG/2ML IJ SOLN
INTRAMUSCULAR | Status: AC
  Administered 2019-07-01: 16:00:00 50 ug via INTRAVENOUS
  Filled 2019-07-01: qty 2

## 2019-07-01 MED ORDER — LIDOCAINE 5 % EX PTCH
1.0000 | MEDICATED_PATCH | CUTANEOUS | Status: DC
  Administered 2019-07-01: 11:00:00 1 via TRANSDERMAL
  Filled 2019-07-01 (×3): qty 1

## 2019-07-01 MED ORDER — NOREPINEPHRINE 4 MG/250ML-% IV SOLN
0.0000 ug/min | INTRAVENOUS | Status: DC
  Administered 2019-07-01: 10 ug/min via INTRAVENOUS
  Administered 2019-07-02 (×4): 40 ug/min via INTRAVENOUS
  Filled 2019-07-01 (×3): qty 250
  Filled 2019-07-01: qty 500

## 2019-07-01 MED ORDER — SODIUM CHLORIDE 0.9% FLUSH: 10.0000 mL | INTRAVENOUS | Status: DC | PRN

## 2019-07-01 MED ORDER — ZOLPIDEM TARTRATE 5 MG PO TABS: 5.0000 mg | ORAL_TABLET | Freq: Every evening | ORAL | Status: DC | PRN

## 2019-07-01 MED ORDER — MIDAZOLAM HCL 2 MG/2ML IJ SOLN
2.0000 mg | INTRAMUSCULAR | Status: DC | PRN
  Filled 2019-07-01: qty 2

## 2019-07-01 MED ORDER — FENTANYL CITRATE (PF) 100 MCG/2ML IJ SOLN
50.0000 ug | Freq: Once | INTRAMUSCULAR | Status: AC
  Administered 2019-07-01: 16:00:00 50 ug via INTRAVENOUS

## 2019-07-01 MED ORDER — POLYETHYLENE GLYCOL 3350 17 G PO PACK: 17.0000 g | PACK | Freq: Every day | ORAL | Status: DC

## 2019-07-01 MED ORDER — SUVOREXANT 20 MG PO TABS: 20.0000 mg | ORAL_TABLET | Freq: Every day | ORAL | Status: DC

## 2019-07-01 MED ORDER — ETOMIDATE 2 MG/ML IV SOLN
INTRAVENOUS | Status: AC
  Administered 2019-07-01: 20 mg
  Filled 2019-07-01: qty 20

## 2019-07-01 MED ORDER — SODIUM CHLORIDE 0.9 % IV BOLUS
1000.0000 mL | Freq: Once | INTRAVENOUS | Status: AC
  Administered 2019-07-01: 23:00:00 1000 mL via INTRAVENOUS

## 2019-07-01 MED ORDER — FENTANYL 2500MCG IN NS 250ML (10MCG/ML) PREMIX INFUSION
50.0000 ug/h | INTRAVENOUS | Status: DC
  Administered 2019-07-01: 50 ug/h via INTRAVENOUS
  Filled 2019-07-01: qty 250

## 2019-07-01 MED ORDER — PREGABALIN 50 MG PO CAPS
75.0000 mg | ORAL_CAPSULE | Freq: Three times a day (TID) | ORAL | Status: DC
  Administered 2019-07-01: 75 mg
  Filled 2019-07-01: qty 1

## 2019-07-01 NOTE — Progress Notes (Signed)
[facility] PCCM  Called her husband, had to leave a message  Roselie Awkward, MD [place] [facility] Pager: [phone] Cell: [phone] If no response, call [phone]

## 2019-07-01 NOTE — Progress Notes (Signed)
Critical ABG results given to Dr. Lake Bells. No new vent orders at this time. Obtain ABG in AM.

## 2019-07-01 NOTE — Progress Notes (Addendum)
[facility] PROGRESS NOTE    Progress Note  Susan [place]  KXF:[id number] DOB: [birth year] DOA: 07/03/2019 PCP: Valerie Roys, DO     Brief Narrative:   Susan Houston is an 58 y.o. female past medical history of CAD, COPD 3 L oxygen dependent, metastatic squamous cell carcinoma diagnosed in 2019, status post resection with recurrence in 2020 undergoing chemo and radiation therapy. She is also status post radiation therapy due to related laryngeal cancer in 2019, depression, chronic pain who presents to the ED on 06/21/2019 with increased shortness of breath and cough, chest x-ray showed bilateral patchy infiltrates in her SARS-CoV-2 test was positive, she was requiring 6 L of oxygen to keep saturation greater than 90%. On admission her CRP was elevated at 19 with a normal procalcitonin. She was started on IV steroids and Remdesivir.  Repeated chest x-ray showed progressive left greater than right airspace opacity, she was given convalescent plasma and transferred to the ICU and PCCM was consulted.  Significant events: Transferred to the ICU on 06/27/2019.   COVID-19 medications: IV Remdesivir for 5 days completed IV steroids for 10 days She received 2 doses of Actemra Received convalescent plasma 06/24/2019.  Assessment/Plan:   Acute respiratory disease due to COVID-19 virus declined 19 viral pneumonia: Patient wants to be intubated for short period of time. She is at high risk for complications due to her multiple comorbidities. Continue to wean down IV steroids. She her oxygen requirements are increasing and she is on FiO2 of 80% on the heated high flow. Patient is unable to prone. She relates continues to have a persistent cough that is bothering her continue Tessalon Perles, guaifenesin. She had a respiratory culture that grew E. coli and diffuse streptococci, her procalcitonin was 3.6, she continues to have a persistent cough will treat empirically with IV Rocephin, continue to  trend procalcitonin.  Right lower lobe metastatic squamous cell cancer: Not a candidate for chemotherapy while in house, will need to follow-up with oncology as an outpatient.  COPD: Continue current regimen.  Major depressive disorder/general anxiety disorder: Continue SSRI and clonazepam.  Chronic pain due to malignancy associated metastatic disease: Continue Lyrica. She relates she continues to have persistent pain is actually getting worst especially around her back and between her shoulder blades, will change her regimen to long-acting oxycodone plus as needed orally.  Chemotherapy-induced pancytopenia/symptomatic anemia: She is status post 1 unit packed red blood cells on 07/15/2019. Hemoglobin has remained stable.  Constipation: Continue current bowel regimen.  Candidal intertrigo: Continue nystatin.  Opioid-induced constipation: Continue current regimen.  Sinus tachycardia: Likely due to infectious etiology and pain.  DVT prophylaxis: lovenxo Family Communication:none Disposition Plan/Barrier to D/C: unable to determine Code Status:     Code Status Orders  (From admission, onward)         Start     Ordered   07/05/2019 0221  Full code  Continuous     06/26/2019 0220        Code Status History    Date Active Date Inactive Code Status Order ID Comments User Context   06/30/2019 0220 07/05/2019 0220 Full Code [id number]  Kristopher Oppenheim, DO Inpatient   11/29/2018 1108 12/02/2018 1743 Full Code [id number]  Hessie Knows, MD Inpatient   05/16/2018 1126 05/20/2018 1644 Full Code [id number]  Nestor Lewandowsky, MD Inpatient   09/24/2014 1653 09/25/2014 2132 Full Code [id number]  Pricilla Loveless, PA-C Inpatient   Advance Care Planning Activity    Advance Directive Documentation  Most Recent Value  Type of Advance Directive  Living will  Pre-existing out of facility DNR order (yellow form or pink MOST form)  -  "MOST" Form in Place?  -        IV Access:    Peripheral IV    Procedures and diagnostic studies:   No results found.   Medical Consultants:    None.  Anti-Infectives:    Subjective:    Susan Houston she feels about the same as yesterday continues have a persistent cough, and pain in her back.  Objective:    Vitals:   07/01/19 0708 07/01/19 0712 07/01/19 0714 07/01/19 0725  BP:      Pulse: (!) 110 (!) 113 (!) 110   Resp: (!) 23 (!) 31 17   Temp:      TempSrc:      SpO2: 90% (!) 80% 95% 100%  Weight:      Height:       SpO2: 100 % O2 Flow Rate (L/min): 30 L/min FiO2 (%): 80 %   Intake/Output Summary (Last 24 hours) at 07/01/2019 0740 Last data filed at 07/01/2019 0500 Gross per 24 hour  Intake 480 ml  Output 2653 ml  Net -2173 ml   Filed Weights   06/30/2019 0501 06/25/19 0120 06/28/19 0418  Weight: 85.9 kg 87.7 kg 87.3 kg    Exam: General exam: In no acute distress. Respiratory system: Good air movement and diffuse crackles bilaterally Cardiovascular system: S1 & S2 heard, RRR. No JVD. Gastrointestinal system: Abdomen is nondistended, soft and nontender.  Central nervous system: Alert and oriented. No focal neurological deficits. Extremities: No pedal edema. Skin: No rashes, lesions or ulcers Psychiatry: Judgement and insight appear normal. Mood & affect appropriate.   Data Reviewed:    Labs: Basic Metabolic Panel: Recent Labs  Lab 06/25/19 0031 06/25/19 0435 06/26/19 0450 06/27/19 0500 06/28/19 0400 06/29/19 0519  NA 138 138 137 136 133*  --   K 3.9 3.9 4.5 4.3 4.6  --   CL  --  92* 99 98 92*  --   CO2  --  34* 30 30 30   --   GLUCOSE  --  153* 126* 138* 157*  --   BUN  --  20 23* 22* 28*  --   CREATININE  --  0.62 0.57 0.63 0.82 0.48  CALCIUM  --  9.1 8.3* 8.5* 8.7*  --    GFR Estimated Creatinine Clearance: 77.7 mL/min (by C-G formula based on SCr of 0.48 mg/dL). Liver Function Tests: Recent Labs  Lab 06/25/19 0435 06/26/19 0450 06/27/19 0500 06/28/19 0400  AST 13* 12* 13* 19  ALT 12 12  13 17   ALKPHOS 84 78 92 88  BILITOT 0.5 0.5 0.7 0.9  PROT 7.3 6.5 6.7 6.6  ALBUMIN 3.1* 2.7* 3.0* 2.8*   No results for input(s): LIPASE, AMYLASE in the last 168 hours. No results for input(s): AMMONIA in the last 168 hours. Coagulation profile No results for input(s): INR, PROTIME in the last 168 hours. COVID-19 Labs  Recent Labs    06/29/19 0519 06/30/19 0450 07/01/19 0505  CRP 24.4* 27.2* 38.1*    Lab Results  Component Value Date   SARSCOV2NAA POSITIVE (A) 06/20/2019   [place] NEGATIVE 04/25/2019   SARSCOV2NAA NOT DETECTED 03/31/2019   SARSCOV2NAA NOT DETECTED 03/16/2019    CBC: Recent Labs  Lab 06/27/19 0500 06/28/19 0400 06/29/19 0524 06/30/19 0450 07/01/19 0505  WBC 8.8 15.6* 8.6 10.4 11.3*  NEUTROABS  8.1* 14.3* 8.0* 9.5* 10.5*  HGB 9.6* 9.9* 8.5* 9.5* 8.5*  HCT 30.8* 31.8* 28.1* 30.5* 27.7*  MCV 96.9 95.8 95.6 95.3 94.9  PLT 146* 157 114* 160 132*   Cardiac Enzymes: No results for input(s): CKTOTAL, CKMB, CKMBINDEX, TROPONINI in the last 168 hours. BNP (last 3 results) No results for input(s): PROBNP in the last 8760 hours. CBG: No results for input(s): GLUCAP in the last 168 hours. D-Dimer: No results for input(s): DDIMER in the last 72 hours. Hgb A1c: No results for input(s): HGBA1C in the last 72 hours. Lipid Profile: No results for input(s): CHOL, HDL, LDLCALC, TRIG, CHOLHDL, LDLDIRECT in the last 72 hours. Thyroid function studies: No results for input(s): TSH, T4TOTAL, T3FREE, THYROIDAB in the last 72 hours.  Invalid input(s): FREET3 Anemia work up: No results for input(s): VITAMINB12, FOLATE, FERRITIN, TIBC, IRON, RETICCTPCT in the last 72 hours. Sepsis Labs: Recent Labs  Lab 06/25/19 0435 06/26/19 0450 06/27/19 0500 06/28/19 0400 06/29/19 0524 06/30/19 0450 07/01/19 0505  PROCALCITON <0.10 <0.10 <0.10  --   --   --   --   WBC 7.5 7.6 8.8 15.6* 8.6 10.4 11.3*   Microbiology Recent Results (from the past 240 hour(s))  MRSA  PCR Screening     Status: None   Collection Time: 06/25/19  9:58 AM   Specimen: Nasal Mucosa; Nasopharyngeal  Result Value Ref Range Status   MRSA by PCR NEGATIVE NEGATIVE Final    Comment:        The GeneXpert MRSA Assay (FDA approved for NASAL specimens only), is one component of a comprehensive MRSA colonization surveillance program. It is not intended to diagnose MRSA infection nor to guide or monitor treatment for MRSA infections. Performed at [facility], [place] [address] [place], [place] [address]   Expectorated sputum assessment w rflx to resp cult     Status: None   Collection Time: 06/27/19  8:19 PM   Specimen: Sputum  Result Value Ref Range Status   Specimen Description SPU  Final   Special Requests Immunocompromised  Final   Sputum evaluation   Final    THIS SPECIMEN IS ACCEPTABLE FOR SPUTUM CULTURE Performed at [facility], [place] [address] [place], [place] [address]    Report Status 06/27/2019 FINAL  Final  Culture, respiratory     Status: None   Collection Time: 06/27/19  8:19 PM   Specimen: Sputum  Result Value Ref Range Status   Specimen Description   Final    SPU Performed at [place] [address] [place], [place] [address]    Special Requests   Final    Immunocompromised Reflexed from [phone] Performed at [facility], [place] [address], [place], [place] [address]    Gram Stain   Final    MODERATE WBC PRESENT, PREDOMINANTLY PMN MODERATE GRAM NEGATIVE RODS FEW GRAM POSITIVE COCCI IN CLUSTERS    Culture   Final    ABUNDANT ESCHERICHIA COLI FEW GROUP B STREP(S.AGALACTIAE)ISOLATED TESTING AGAINST S. AGALACTIAE NOT ROUTINELY PERFORMED DUE TO PREDICTABILITY OF AMP/PEN/VAN SUSCEPTIBILITY. Performed at [facility], [place] [address] [place], [place] [address]    Report Status 06/30/2019 FINAL  Final   Organism ID, Bacteria ESCHERICHIA COLI  Final      Susceptibility    Escherichia coli - MIC*    AMPICILLIN >=32 RESISTANT Resistant     CEFAZOLIN <=4 SENSITIVE Sensitive     CEFEPIME <=1 SENSITIVE Sensitive  CEFTAZIDIME <=1 SENSITIVE Sensitive     CEFTRIAXONE <=1 SENSITIVE Sensitive     CIPROFLOXACIN <=0.25 SENSITIVE Sensitive     GENTAMICIN <=1 SENSITIVE Sensitive     IMIPENEM <=0.25 SENSITIVE Sensitive     TRIMETH/SULFA <=20 SENSITIVE Sensitive     AMPICILLIN/SULBACTAM 16 INTERMEDIATE Intermediate     PIP/TAZO <=4 SENSITIVE Sensitive     Extended ESBL NEGATIVE Sensitive     * ABUNDANT ESCHERICHIA COLI     Medications:   . sodium chloride   Intravenous Once  . aerochamber plus with mask  1 each Other Once  . aspirin EC  81 mg Oral Daily  . benzonatate  200 mg Oral TID  . Chlorhexidine Gluconate Cloth  6 each Topical Daily  . dronabinol  2.5 mg Oral QAC lunch  . enoxaparin (LOVENOX) injection  40 mg Subcutaneous Q12H  . feeding supplement (PRO-STAT SUGAR FREE 64)  30 mL Oral TID  . FLUoxetine  80 mg Oral Daily  . Ipratropium-Albuterol  1 puff Inhalation TID  . methylPREDNISolone (SOLU-MEDROL) injection  20 mg Intravenous Q12H  . nystatin   Topical TID  . oxymetazoline  1 spray Each Nare BID  . pantoprazole  40 mg Oral Daily  . polyethylene glycol  17 g Oral Daily  . pregabalin  75 mg Oral TID  . senna-docusate  2 tablet Oral BID  . sucralfate  1 g Oral TID  . Suvorexant  20 mg Oral QHS   Continuous Infu   LOS: 9 days   Charlynne Cousins  [facility]  07/01/2019, 7:40 AM

## 2019-07-01 NOTE — Progress Notes (Signed)
[facility] PCCM  Sudden worsening in respiratory distress Changed to NRB, oxygenation OK, but visibly dyspneic Increased accessory muscle use Updated her husband Intubated, ARDS protocol RASS target -2  Susan Awkward, MD [place] [facility] Pager: [phone] Cell: [phone] If no response, call [phone]

## 2019-07-01 NOTE — Procedures (Signed)
Intubation Procedure Note CAROLLE ISHII [id number] 05-Feb-1961  Procedure: Intubation Indications: Respiratory insufficiency  Procedure Details Consent: Risks of procedure as well as the alternatives and risks of each were explained to the (patient/caregiver).  Consent for procedure obtained. Time Out: Verified patient identification, verified procedure, site/side was marked, verified correct patient position, special equipment/implants available, medications/allergies/relevent history reviewed, required imaging and test results available.  Performed  Drugs Versed 48m, Fentanyl 554m IV, Etomidate 20104mV, Rocuronium 29m73m DL x 1 with MAC 4 blade Grade 1 view 7.5 ET tube passed through cords under direct visualization Placement confirmed with bilateral breath sounds, positive EtCO2 change and smoke in tube   Evaluation Hemodynamic Status: BP stable throughout; O2 sats: transiently fell during during procedure Patient's Current Condition: stable Complications: No apparent complications Patient did tolerate procedure well. Chest X-ray ordered to verify placement.  CXR: pending.   DougSimonne Maffucci2/2020

## 2019-07-01 NOTE — Progress Notes (Signed)
[facility] PCCM  ABG reviewed, will allow permissive hypercapnea Hold off on prone position given port in chest, currently accessed for IV access Repeat ABG in AM  Called husband for update  Roselie Awkward, MD [place] [facility] Pager: [phone] Cell: [phone] If no response, call [phone]

## 2019-07-01 NOTE — Progress Notes (Signed)
NAME:  Susan [place], MRN:  [id number], DOB:  01-24-1961, LOS: 81 ADMISSION DATE:  06/21/2019, CONSULTATION DATE:  06/24/2019 REFERRING MD:  Bonner Puna, CHIEF COMPLAINT:  COVID 28 pneumonia   Brief History   58 year old female diagnosed with COVID-19 pneumonia on September 4, has underlying history of recurrent squamous cell carcinoma of the lung.  Transferred to [facility] for further management of hypoxemic respiratory failure.  Past Medical History  Squamous cell carcinoma of right lower lobe, status post surgical resection in July 2019.  Mild cognitive impairment.  Laryngeal cancer 2009.  Hyperlipidemia.  Kidney stones.  GERD.  Diverticulitis.  COPD, on 2 L nasal cannula at night.  Anxiety.  Significant Hospital Events   Transferred to intensive care unit and received convalescent plasma on September 5  Consults:  PCCM  Procedures:    Significant Diagnostic Tests:    Micro Data:  September 1 SARS-CoV-2 positive  Antimicrobials:  Remdesivir 9/2 >  9/6 Solumedrol 9/2 >   Interim history/subjective:   Slept OK Cough still bothering her Still complaining of pain in her shoulder blades  Objective   Blood pressure (!) 144/71, pulse (!) 110, temperature 98.2 F (36.8 C), temperature source Oral, resp. rate 17, height 5\' 2"  (1.575 m), weight 87.3 kg, SpO2 100 %.    FiO2 (%):  [60 %-100 %] 80 %   Intake/Output Summary (Last 24 hours) at 07/01/2019 0744 Last data filed at 07/01/2019 0500 Gross per 24 hour  Intake 480 ml  Output 2653 ml  Net -2173 ml   Filed Weights   06/20/2019 0501 06/25/19 0120 06/28/19 0418  Weight: 85.9 kg 87.7 kg 87.3 kg    Examination:  General:  Chronically ill appearing, esting comfortably in bed HENT: NCAT OP clear PULM: Crackles bases B, normal effort CV: RRR, no mgr GI: BS+, soft, nontender MSK: normal bulk and tone Neuro: drowsy, alert, , MAEW   9/8 CXR reviewed: left lung infiltrate, now ? New R mid lung infiltrate  Resolved  Hospital Problem list     Assessment & Plan:  ARDS from COVID-19 pneumonia: severe hypoxemia, stable oxygenation overnight; complicated by chest congestion Continue fultter valve Continue heated high flow O2 in ICU environment Plan 10 days systemic steroids Tolerate periods of hypoxemia, goal at rest is greater than 85% SaO2, with movement ideally above 75% Decision for intubation should be based on a change in mental status or physical evidence of ventilatory failure such as nasal flaring, accessory muscle use, paradoxical breathing Out of bed to chair as able Incentive spirometry is important, use every hour Prone positioning while in bed  COPD: not clearly in exacerbation combivent QID  Metastatic lung cancer, recently treated with chemotherapy and radiation Monitor imaging  Deconditioning Out of bed Range of motion exercises PT/OT   Best practice:  Diet: regular diet Pain/Anxiety/Delirium protocol (if indicated): n/a VAP protocol (if indicated): n/a DVT prophylaxis: lovenox GI prophylaxis: Pantoprazole for stress ulcer prophylaxis Glucose control: SSI Mobility: out of bed to chair Code Status: FULL, if intubated would not want more than 1 week of mechanical ventilation Family Communication: will update husband 9/12 Disposition:   Labs   CBC: Recent Labs  Lab 06/27/19 0500 06/28/19 0400 06/29/19 0524 06/30/19 0450 07/01/19 0505  WBC 8.8 15.6* 8.6 10.4 11.3*  NEUTROABS 8.1* 14.3* 8.0* 9.5* 10.5*  HGB 9.6* 9.9* 8.5* 9.5* 8.5*  HCT 30.8* 31.8* 28.1* 30.5* 27.7*  MCV 96.9 95.8 95.6 95.3 94.9  PLT 146* 157 114* 160 132*  Basic Metabolic Panel: Recent Labs  Lab 06/25/19 0031 06/25/19 0435 06/26/19 0450 06/27/19 0500 06/28/19 0400 06/29/19 0519  NA 138 138 137 136 133*  --   K 3.9 3.9 4.5 4.3 4.6  --   CL  --  92* 99 98 92*  --   CO2  --  34* 30 30 30   --   GLUCOSE  --  153* 126* 138* 157*  --   BUN  --  20 23* 22* 28*  --   CREATININE  --  0.62  0.57 0.63 0.82 0.48  CALCIUM  --  9.1 8.3* 8.5* 8.7*  --    GFR: Estimated Creatinine Clearance: 77.7 mL/min (by C-G formula based on SCr of 0.48 mg/dL). Recent Labs  Lab 06/25/19 0435 06/26/19 0450 06/27/19 0500 06/28/19 0400 06/29/19 0524 06/30/19 0450 07/01/19 0505  PROCALCITON <0.10 <0.10 <0.10  --   --   --   --   WBC 7.5 7.6 8.8 15.6* 8.6 10.4 11.3*    Liver Function Tests: Recent Labs  Lab 06/25/19 0435 06/26/19 0450 06/27/19 0500 06/28/19 0400  AST 13* 12* 13* 19  ALT 12 12 13 17   ALKPHOS 84 78 92 88  BILITOT 0.5 0.5 0.7 0.9  PROT 7.3 6.5 6.7 6.6  ALBUMIN 3.1* 2.7* 3.0* 2.8*   No results for input(s): LIPASE, AMYLASE in the last 168 hours. No results for input(s): AMMONIA in the last 168 hours.  ABG    Component Value Date/Time   PHART 7.434 06/25/2019 0031   PCO2ART 44.8 06/25/2019 0031   PO2ART 61.0 (L) 06/25/2019 0031   HCO3 30.2 (H) 06/25/2019 0031   TCO2 32 06/25/2019 0031   O2SAT 92.0 06/25/2019 0031     Coagulation Profile: No results for input(s): INR, PROTIME in the last 168 hours.  Cardiac Enzymes: No results for input(s): CKTOTAL, CKMB, CKMBINDEX, TROPONINI in the last 168 hours.  HbA1C: No results found for: HGBA1C  CBG: No results for input(s): GLUCAP in the last 168 hours.    Critical care time: 30 minutes    Roselie Awkward, MD [place] [facility] Pager: [phone] Cell: [phone] If no response, call [phone]

## 2019-07-01 NOTE — TOC Progression Note (Signed)
Transition of Care [facility]) - Progression Note    Patient Details  Name: Susan Houston MRN: [id number] Date of Birth: 1961-04-09  Transition of Care [facility]) CM/SW Contact  Loletha Grayer Beverely Pace, RN Phone Number: 07/01/2019, 2:55 PM  Clinical Narrative:   Case manager continues to monitor patient for appropriate discharge plan. May she be blessed to recover.           Expected Discharge Plan and Services to be determined                                                 Social Determinants of Health (SDOH) Interventions    Readmission Risk Interventions No flowsheet data found.

## 2019-07-01 NOTE — Progress Notes (Signed)
RT placed patient on NRB mask 15L due to patient desat on Heated HFNC at 100% and 30L.

## 2019-07-02 DIAGNOSIS — R6521 Severe sepsis with septic shock: Secondary | ICD-10-CM

## 2019-07-02 DIAGNOSIS — A419 Sepsis, unspecified organism: Secondary | ICD-10-CM

## 2019-07-02 DIAGNOSIS — J80 Acute respiratory distress syndrome: Secondary | ICD-10-CM

## 2019-07-02 DIAGNOSIS — A491 Streptococcal infection, unspecified site: Secondary | ICD-10-CM

## 2019-07-02 DIAGNOSIS — J189 Pneumonia, unspecified organism: Secondary | ICD-10-CM

## 2019-07-02 DIAGNOSIS — U071 COVID-19: Secondary | ICD-10-CM

## 2019-07-02 LAB — POCT I-STAT 7, (LYTES, BLD GAS, ICA,H+H)
Acid-base deficit: 1 mmol/L (ref 0.0–2.0)
Acid-base deficit: 3 mmol/L — ABNORMAL HIGH (ref 0.0–2.0)
Acid-base deficit: 4 mmol/L — ABNORMAL HIGH (ref 0.0–2.0)
Bicarbonate: 27.8 mmol/L (ref 20.0–28.0)
Bicarbonate: 25 mmol/L (ref 20.0–28.0)
Bicarbonate: 25.4 mmol/L (ref 20.0–28.0)
Calcium, Ion: 1.1 mmol/L — ABNORMAL LOW (ref 1.15–1.40)
Calcium, Ion: 1.11 mmol/L — ABNORMAL LOW (ref 1.15–1.40)
Calcium, Ion: 1 mmol/L — ABNORMAL LOW (ref 1.15–1.40)
HCT: 26 % — ABNORMAL LOW (ref 36.0–46.0)
HCT: 24 % — ABNORMAL LOW (ref 36.0–46.0)
HCT: 25 % — ABNORMAL LOW (ref 36.0–46.0)
Hemoglobin: 8.8 g/dL — ABNORMAL LOW (ref 12.0–15.0)
Hemoglobin: 8.2 g/dL — ABNORMAL LOW (ref 12.0–15.0)
Hemoglobin: 8.5 g/dL — ABNORMAL LOW (ref 12.0–15.0)
O2 Saturation: 92 %
O2 Saturation: 94 %
O2 Saturation: 95 %
Patient temperature: 102.4
Patient temperature: 101.7
Patient temperature: 103.3
Potassium: 4.7 mmol/L (ref 3.5–5.1)
Potassium: 4.8 mmol/L (ref 3.5–5.1)
Potassium: 6.9 mmol/L (ref 3.5–5.1)
Sodium: 136 mmol/L (ref 135–145)
Sodium: 137 mmol/L (ref 135–145)
Sodium: 137 mmol/L (ref 135–145)
TCO2: 30 mmol/L (ref 22–32)
TCO2: 27 mmol/L (ref 22–32)
TCO2: 27 mmol/L (ref 22–32)
pCO2 arterial: 78.2 mmHg (ref 32.0–48.0)
pCO2 arterial: 68.7 mmHg (ref 32.0–48.0)
pCO2 arterial: 77.8 mmHg (ref 32.0–48.0)
pH, Arterial: 7.171 — CL (ref 7.350–7.450)
pH, Arterial: 7.179 — CL (ref 7.350–7.450)
pH, Arterial: 7.136 — CL (ref 7.350–7.450)
pO2, Arterial: 94 mmHg (ref 83.0–108.0)
pO2, Arterial: 96 mmHg (ref 83.0–108.0)
pO2, Arterial: 111 mmHg — ABNORMAL HIGH (ref 83.0–108.0)

## 2019-07-02 LAB — CBC WITH DIFFERENTIAL/PLATELET
Abs Immature Granulocytes: 0.14 10*3/uL — ABNORMAL HIGH (ref 0.00–0.07)
Basophils Absolute: 0.1 10*3/uL (ref 0.0–0.1)
Basophils Relative: 1 %
Eosinophils Absolute: 0 10*3/uL (ref 0.0–0.5)
Eosinophils Relative: 0 %
HCT: 28.4 % — ABNORMAL LOW (ref 36.0–46.0)
Hemoglobin: 8.2 g/dL — ABNORMAL LOW (ref 12.0–15.0)
Immature Granulocytes: 1 %
Lymphocytes Relative: 2 %
Lymphs Abs: 0.4 10*3/uL — ABNORMAL LOW (ref 0.7–4.0)
MCH: 29.3 pg (ref 26.0–34.0)
MCHC: 28.9 g/dL — ABNORMAL LOW (ref 30.0–36.0)
MCV: 101.4 fL — ABNORMAL HIGH (ref 80.0–100.0)
Monocytes Absolute: 0.7 10*3/uL (ref 0.1–1.0)
Monocytes Relative: 4 %
Neutro Abs: 15.1 10*3/uL — ABNORMAL HIGH (ref 1.7–7.7)
Neutrophils Relative %: 92 %
Platelets: 181 10*3/uL (ref 150–400)
RBC: 2.8 MIL/uL — ABNORMAL LOW (ref 3.87–5.11)
RDW: 19.3 % — ABNORMAL HIGH (ref 11.5–15.5)
WBC: 16.4 10*3/uL — ABNORMAL HIGH (ref 4.0–10.5)
nRBC: 2 % — ABNORMAL HIGH (ref 0.0–0.2)

## 2019-07-02 LAB — URINALYSIS, ROUTINE W REFLEX MICROSCOPIC
Bilirubin Urine: NEGATIVE
Glucose, UA: NEGATIVE mg/dL
Ketones, ur: NEGATIVE mg/dL
Nitrite: NEGATIVE
Protein, ur: NEGATIVE mg/dL
Specific Gravity, Urine: 1.012 (ref 1.005–1.030)
pH: 6 (ref 5.0–8.0)

## 2019-07-02 LAB — BASIC METABOLIC PANEL
Anion gap: 15 (ref 5–15)
BUN: 33 mg/dL — ABNORMAL HIGH (ref 6–20)
CO2: 24 mmol/L (ref 22–32)
Calcium: 7.5 mg/dL — ABNORMAL LOW (ref 8.9–10.3)
Chloride: 98 mmol/L (ref 98–111)
Creatinine, Ser: 1.39 mg/dL — ABNORMAL HIGH (ref 0.44–1.00)
GFR calc Af Amer: 48 mL/min — ABNORMAL LOW (ref >60)
GFR calc non Af Amer: 41 mL/min — ABNORMAL LOW (ref >60)
Glucose, Bld: 83 mg/dL (ref 70–99)
Potassium: 5.1 mmol/L (ref 3.5–5.1)
Sodium: 137 mmol/L (ref 135–145)

## 2019-07-02 LAB — DIC (DISSEMINATED INTRAVASCULAR COAGULATION)PANEL
D-Dimer, Quant: 11.17 ug/mL-FEU — ABNORMAL HIGH (ref 0.00–0.50)
Fibrinogen: >800 mg/dL — ABNORMAL HIGH (ref 210–475)
INR: 1.6 — ABNORMAL HIGH (ref 0.8–1.2)
Platelets: 167 10*3/uL (ref 150–400)
Prothrombin Time: 19.1 seconds — ABNORMAL HIGH (ref 11.4–15.2)
Smear Review: NONE SEEN
aPTT: 36 seconds (ref 24–36)

## 2019-07-02 LAB — MAGNESIUM: Magnesium: 1.8 mg/dL (ref 1.7–2.4)

## 2019-07-02 LAB — C-REACTIVE PROTEIN: CRP: 33.5 mg/dL — ABNORMAL HIGH (ref <1.0)

## 2019-07-02 LAB — PROCALCITONIN: Procalcitonin: 38.6 ng/mL

## 2019-07-02 LAB — PHOSPHORUS: Phosphorus: 5.4 mg/dL — ABNORMAL HIGH (ref 2.5–4.6)

## 2019-07-02 LAB — GLUCOSE, CAPILLARY: Glucose-Capillary: 82 mg/dL (ref 70–99)

## 2019-07-02 MED ORDER — FENTANYL 2500MCG IN NS 250ML (10MCG/ML) PREMIX INFUSION
INTRAVENOUS | Status: AC
  Administered 2019-07-02: 14:00:00 100 ug
  Filled 2019-07-02: qty 250

## 2019-07-02 MED ORDER — SODIUM BICARBONATE 8.4 % IV SOLN
100.0000 meq | Freq: Once | INTRAVENOUS | Status: AC
  Administered 2019-07-02: 100 meq via INTRAVENOUS

## 2019-07-02 MED ORDER — SODIUM CHLORIDE 0.9 % IV SOLN: 100.0000 mg | INTRAVENOUS | Status: DC

## 2019-07-02 MED ORDER — VANCOMYCIN HCL IN DEXTROSE 750-5 MG/150ML-% IV SOLN: 750.0000 mg | INTRAVENOUS | Status: DC

## 2019-07-02 MED ORDER — SODIUM BICARBONATE 8.4 % IV SOLN
INTRAVENOUS | Status: AC
  Filled 2019-07-02: qty 50

## 2019-07-02 MED ORDER — MIDAZOLAM BOLUS VIA INFUSION
4.0000 mg | Freq: Once | INTRAVENOUS | Status: DC
  Filled 2019-07-02: qty 4

## 2019-07-02 MED ORDER — SODIUM CHLORIDE 0.9 % IV SOLN
2.0000 g | Freq: Two times a day (BID) | INTRAVENOUS | Status: DC
  Administered 2019-07-02: 12:00:00 2 g via INTRAVENOUS
  Filled 2019-07-02: qty 2

## 2019-07-02 MED ORDER — MIDAZOLAM 50MG/50ML (1MG/ML) PREMIX INFUSION
0.0000 mg/h | INTRAVENOUS | Status: DC
  Administered 2019-07-02 (×2): 2 mg/h via INTRAVENOUS
  Filled 2019-07-02 (×2): qty 50

## 2019-07-02 MED ORDER — EPINEPHRINE PF 1 MG/ML IJ SOLN
0.0000 ug/min | INTRAVENOUS | Status: DC
  Administered 2019-07-02: 11:00:00 0.5 ug/min via INTRAVENOUS
  Filled 2019-07-02: qty 8

## 2019-07-02 MED ORDER — HYDROCORTISONE NA SUCCINATE PF 100 MG IJ SOLR
50.0000 mg | Freq: Four times a day (QID) | INTRAMUSCULAR | Status: DC
  Administered 2019-07-02: 12:00:00 50 mg via INTRAVENOUS
  Filled 2019-07-02: qty 2

## 2019-07-02 MED ORDER — FENTANYL 2500MCG IN NS 250ML (10MCG/ML) PREMIX INFUSION: 0.0000 ug/h | INTRAVENOUS | Status: DC

## 2019-07-02 MED ORDER — NOREPINEPHRINE 16 MG/250ML-% IV SOLN
0.0000 ug/min | INTRAVENOUS | Status: DC
  Administered 2019-07-02: 08:00:00 40 ug/min via INTRAVENOUS
  Filled 2019-07-02: qty 250

## 2019-07-02 MED ORDER — POLYVINYL ALCOHOL 1.4 % OP SOLN
1.0000 [drp] | Freq: Four times a day (QID) | OPHTHALMIC | Status: DC | PRN
  Filled 2019-07-02: qty 15

## 2019-07-02 MED ORDER — ALBUTEROL SULFATE (2.5 MG/3ML) 0.083% IN NEBU
2.5000 mg | INHALATION_SOLUTION | RESPIRATORY_TRACT | Status: DC | PRN
  Administered 2019-07-02: 2.5 mg via RESPIRATORY_TRACT
  Filled 2019-07-02: qty 3

## 2019-07-02 MED ORDER — MIDAZOLAM 50MG/50ML (1MG/ML) PREMIX INFUSION: 0.5000 mg/h | INTRAVENOUS | Status: DC

## 2019-07-02 MED ORDER — PHENYLEPHRINE HCL-NACL 10-0.9 MG/250ML-% IV SOLN
0.0000 ug/min | INTRAVENOUS | Status: DC
  Administered 2019-07-02: 05:00:00 50 ug/min via INTRAVENOUS
  Administered 2019-07-02: 01:00:00 20 ug/min via INTRAVENOUS
  Filled 2019-07-02 (×2): qty 250

## 2019-07-02 MED ORDER — FENTANYL BOLUS VIA INFUSION
400.0000 ug | INTRAVENOUS | Status: DC | PRN
  Filled 2019-07-02: qty 400

## 2019-07-02 MED ORDER — DEXTROSE 5 % IV SOLN: INTRAVENOUS | Status: DC

## 2019-07-02 MED ORDER — GLYCOPYRROLATE 0.2 MG/ML IJ SOLN: 0.2000 mg | INTRAMUSCULAR | Status: DC | PRN

## 2019-07-02 MED ORDER — VANCOMYCIN HCL 10 G IV SOLR
1500.0000 mg | Freq: Once | INTRAVENOUS | Status: DC
  Filled 2019-07-02: qty 1500

## 2019-07-02 MED ORDER — BUDESONIDE 0.5 MG/2ML IN SUSP
0.5000 mg | Freq: Two times a day (BID) | RESPIRATORY_TRACT | Status: DC
  Administered 2019-07-02: 12:00:00 0.5 mg via RESPIRATORY_TRACT
  Filled 2019-07-02: qty 2

## 2019-07-02 MED ORDER — ACETAMINOPHEN 650 MG RE SUPP: 650.0000 mg | Freq: Four times a day (QID) | RECTAL | Status: DC | PRN

## 2019-07-02 MED ORDER — SODIUM CHLORIDE 0.9 % IV BOLUS: 1000.0000 mL | Freq: Once | INTRAVENOUS | Status: AC

## 2019-07-02 MED ORDER — SODIUM CHLORIDE 0.9 % IV BOLUS
1000.0000 mL | Freq: Once | INTRAVENOUS | Status: AC
  Administered 2019-07-02: 01:00:00 1000 mL via INTRAVENOUS

## 2019-07-02 MED ORDER — DIPHENHYDRAMINE HCL 50 MG/ML IJ SOLN: 25.0000 mg | INTRAMUSCULAR | Status: DC | PRN

## 2019-07-02 MED ORDER — SODIUM BICARBONATE 8.4 % IV SOLN
INTRAVENOUS | Status: DC
  Filled 2019-07-02: qty 150

## 2019-07-02 MED ORDER — MIDAZOLAM HCL 2 MG/2ML IJ SOLN: 2.0000 mg | INTRAMUSCULAR | Status: DC | PRN

## 2019-07-02 MED ORDER — SODIUM BICARBONATE-DEXTROSE 150-5 MEQ/L-% IV SOLN
150.0000 meq | INTRAVENOUS | Status: DC
  Administered 2019-07-02: 10:00:00 150 meq via INTRAVENOUS
  Filled 2019-07-02 (×2): qty 1000

## 2019-07-02 MED ORDER — NOREPINEPHRINE 4 MG/250ML-% IV SOLN
INTRAVENOUS | Status: AC
  Filled 2019-07-02: qty 250

## 2019-07-02 MED ORDER — VASOPRESSIN 20 UNIT/ML IV SOLN
0.0300 [IU]/min | INTRAVENOUS | Status: DC
  Administered 2019-07-02: 01:00:00 0.03 [IU]/min via INTRAVENOUS
  Filled 2019-07-02: qty 2

## 2019-07-02 MED ORDER — FENTANYL BOLUS VIA INFUSION
100.0000 ug | INTRAVENOUS | Status: DC | PRN
  Filled 2019-07-02: qty 100

## 2019-07-02 MED ORDER — SODIUM BICARBONATE 8.4 % IV SOLN: INTRAVENOUS | Status: DC

## 2019-07-02 MED ORDER — PHENYLEPHRINE HCL-NACL 40-0.9 MG/250ML-% IV SOLN
0.0000 ug/min | INTRAVENOUS | Status: DC
  Administered 2019-07-02: 08:00:00 80 ug/min via INTRAVENOUS
  Filled 2019-07-02 (×2): qty 250

## 2019-07-02 MED ORDER — SODIUM BICARBONATE 8.4 % IV SOLN: 50.0000 meq | Freq: Once | INTRAVENOUS | Status: AC

## 2019-07-02 MED ORDER — ACETAMINOPHEN 325 MG PO TABS: 650.0000 mg | ORAL_TABLET | Freq: Four times a day (QID) | ORAL | Status: DC | PRN

## 2019-07-02 MED ORDER — SODIUM CHLORIDE 0.9 % IV SOLN
200.0000 mg | INTRAVENOUS | Status: DC
  Filled 2019-07-02: qty 200

## 2019-07-02 MED ORDER — SODIUM CHLORIDE 0.9 % IV SOLN
200.0000 mg | Freq: Once | INTRAVENOUS | Status: DC
  Filled 2019-07-02: qty 200

## 2019-07-02 MED ORDER — ARFORMOTEROL TARTRATE 15 MCG/2ML IN NEBU
15.0000 ug | INHALATION_SOLUTION | Freq: Two times a day (BID) | RESPIRATORY_TRACT | Status: DC
  Filled 2019-07-02 (×3): qty 2

## 2019-07-02 MED ORDER — GLYCOPYRROLATE 1 MG PO TABS
1.0000 mg | ORAL_TABLET | ORAL | Status: DC | PRN
  Filled 2019-07-02: qty 1

## 2019-07-02 MED ORDER — SODIUM BICARBONATE 8.4 % IV SOLN
INTRAVENOUS | Status: AC
  Administered 2019-07-02: 09:00:00
  Filled 2019-07-02: qty 50

## 2019-07-03 ENCOUNTER — Encounter (HOSPITAL_COMMUNITY): Payer: Self-pay | Admitting: Certified Registered Nurse Anesthetist

## 2019-07-03 ENCOUNTER — Telehealth: Payer: Self-pay | Admitting: *Deleted

## 2019-07-03 LAB — URINE CULTURE: Culture: >=100000 — AB

## 2019-07-03 NOTE — Anesthesia Procedure Notes (Signed)
Arterial Line Insertion Start/End09/22/2020 2:30 AM Performed by: Alain Marion, CRNA, CRNA  Patient location: ICU. Preanesthetic checklist: patient identified, IV checked, site marked, risks and benefits discussed, monitors and equipment checked, pre-op evaluation and timeout performed Left, radial was placed Catheter size: 20 G Hand hygiene performed  and maximum sterile barriers used   Attempts: 1 Procedure performed without using ultrasound guided technique. Following insertion, dressing applied and Biopatch. Post procedure assessment: normal  Patient tolerated the procedure well with no immediate complications. Additional procedure comments: [facility] ICU, E link physician ordered A line placement, RT attempted a line placement several times on right/left radial artery.  Janet Berlin, CRNA placed a line x1 attempt.  No complications noted.Marland Kitchen

## 2019-07-03 NOTE — Telephone Encounter (Signed)
Pt's husband called and wanted to inform Dr. B that patient passed away yesterday. He wanted to let Dr. Jacinto Reap and everyone at the [facility] know how grateful he was for the care his wife received for her cancer care.

## 2019-07-05 ENCOUNTER — Ambulatory Visit: Payer: Medicare Other | Admitting: Radiation Oncology

## 2019-07-19 ENCOUNTER — Ambulatory Visit: Payer: Medicare Other | Admitting: Psychiatry

## 2019-07-20 NOTE — Progress Notes (Signed)
Notified Dr Lake Bells of pts decomposition.  Notified that pt was maxed out on levophed.  BP 77/49 (60).  MD ordered to add epi drip.  Epi drip started.  Notified MD that pts gas after Bicarb initiated was 7.13, 77.8, 111, 25.4, 95%, potassium 6.9.  MD called and spoke with pts husband.  Now DNR.  Family to come visit.  MD ordered to 2 give amps of bicarb and max out pressors. Husband to be here in 18 mins.

## 2019-07-20 NOTE — Progress Notes (Signed)
Facilitated family visit of pts husband and brother in law.  Pts cell phone, charger and clothing was given to patients husband.

## 2019-07-20 NOTE — Progress Notes (Addendum)
[facility] PROGRESS NOTE    Progress Note  Susan Houston  TML:[id number] DOB: [birth year] DOA: 07/10/2019 PCP: Valerie Roys, DO     Brief Narrative:   Susan Houston is an 58 y.o. female past medical history of CAD, COPD 3 L oxygen dependent, laryngeal carcinoma diagnosed in 2019, squamous cell lung cancer status post resection with recurrence in 2020 undergoing chemo and radiation therapy. She is also status post radiation therapy due to related laryngeal cancer in 2019, depression, chronic pain who presents to the ED on 06/21/2019 with increased shortness of breath and cough, chest x-ray showed bilateral patchy infiltrates in her SARS-CoV-2 test was positive, she was requiring 6 L of oxygen to keep saturation greater than 90%. On admission her CRP was elevated at 19 with a normal procalcitonin. She was started on IV steroids and Remdesivir.  Repeated chest x-ray showed progressive left greater than right airspace opacity, she was given convalescent plasma and transferred to the ICU and PCCM was consulted.  Significant events: Admitted to [facility] on 06/28/2019 Transferred to the ICU on 06/27/2019. Intubated on 07/01/2019 Respiratory culture grew E. coli and strep  Agalactiae on 06/27/2019 07/01/2019 Rocephin July 14, 2019 07-14-19 IV vancomycin and cefepime   COVID-19 medications: IV Remdesivir for 5 days completed IV steroids for 10 days She received 2 doses of Actemra Received convalescent plasma 06/24/2019.  Assessment/Plan:   Acute respiratory disease due to COVID-19 virus declined 19 viral pneumonia: With worsening of her respiratory status, her nasal cannula was changed to nonrebreather but visibly she was in distress. She was intubated and started on the ARDS protocol, further vent management and sedation per PCCM. She is unable to prone.  Severe septic shock possibly due to H CAP: Hypotensive overnight had to be started on levo and vasopressin.  Continue titrate  pressors to keep map greater than 65. Central line was placed will check CVP every 12 hours. Continue monitor urine output. She is being covered with IV vancomycin and cefepime while we also started her on antifungal. We will have to speak to the husband about the very poor prognosis she has and hopefully we can transition her to comfort measures.  Right lower lobe metastatic squamous cell cancer: Not a candidate for chemotherapy at this point, will need follow-up with oncology as an outpatient.  COPD: Continue current regimen.  Major depressive disorder/general anxiety disorder: Continue SSRI and clonazepam.  Chronic pain due to malignancy associated metastatic disease: Continue Lyrica. Continue narcotics for use of fentanyl which will also help with sedation for vent management.  Chemotherapy-induced pancytopenia/symptomatic anemia: She is status post 1 unit packed red blood cells on 07/04/2019. Hemoglobin has remained stable.  Constipation: Continue current bowel regimen.  Candidal intertrigo: Continue nystatin.  Opioid-induced constipation: Continue current regimen.  Sinus tachycardia: Likely due to infectious etiology and pain.  DVT prophylaxis: lovenxo Family Communication:none Disposition Plan/Barrier to D/C: unable to determine Code Status:     Code Status Orders  (From admission, onward)         Start     Ordered   07/06/2019 0221  Full code  Continuous     06/28/2019 0220        Code Status History    Date Active Date Inactive Code Status Order ID Comments User Context   07/15/2019 0220 07/06/2019 0220 Full Code [id number]  Kristopher Oppenheim, DO Inpatient   11/29/2018 1108 12/02/2018 1743 Full Code [id number]  Hessie Knows, MD Inpatient   05/16/2018 1126 05/20/2018 1644  Full Code [id number]  Nestor Lewandowsky, MD Inpatient   09/24/2014 1653 09/25/2014 2132 Full Code [id number]  Pricilla Loveless, PA-C Inpatient   Advance Care Planning Activity    Advance Directive  Documentation     Most Recent Value  Type of Advance Directive  Living will  Pre-existing out of facility DNR order (yellow form or pink MOST form)  -  "MOST" Form in Place?  -        IV Access:    Peripheral IV   Procedures and diagnostic studies:   Dg Chest Port 1 View  Result Date: 07/01/2019 CLINICAL DATA:  Endotracheal tube and NG tube placement. Respiratory failure. EXAM: PORTABLE CHEST 1 VIEW COMPARISON:  06/27/2019 and prior radiographs FINDINGS: An endotracheal tube with tip 2.5 cm above the carina and NG tube in the stomach now noted. A LEFT subclavian Port-A-Cath with tip overlying the SUPERIOR cavoatrial junction again noted. Diffuse opacities within the LEFT lung again noted as well as mild patchy opacity/airspace disease within the RIGHT UPPER lung. No evidence of pneumothorax. IMPRESSION: Endotracheal tube and NG tube placement as described. Unchanged diffuse LEFT lung airspace disease/opacities and mild patchy opacity/airspace disease within the RIGHT UPPER lung. Electronically Signed   By: Margarette Canada M.D.   On: 07/01/2019 16:17     Medical Consultants:    None.  Anti-Infectives:    Subjective:    [name] sedated and intubated.  Objective:    Vitals:   07/27/19 0300 07/27/2019 0328 2019/07/27 0400 07-27-19 0500  BP: (!) 43/27   (!) 86/54  Pulse: 65 (!) 120    Resp: 18 (!) 23  18  Temp:  (!) 102.4 F (39.1 C) 99.1 F (37.3 C)   TempSrc:   Oral   SpO2: (!) 73% 100%    Weight:      Height:       SpO2: 100 % O2 Flow Rate (L/min): 15 L/min FiO2 (%): 100 %   Intake/Output Summary (Last 24 hours) at 07/27/19 0724 Last data filed at 07-27-19 0527 Gross per 24 hour  Intake 6176.17 ml  Output 3500 ml  Net 2676.17 ml   Filed Weights   06/30/2019 0501 06/25/19 0120 06/28/19 0418  Weight: 85.9 kg 87.7 kg 87.3 kg    Exam: General exam: In no acute distress, intubated and sedated Respiratory system: Good air movement and diffuse crackles with  poor air movement Cardiovascular system: S1 & S2 heard, RRR. No JVD. Gastrointestinal system: Abdomen is nondistended, soft and nontender.  Extremities: No pedal edema. Skin: No rashes, lesions or ulcers   Data Reviewed:    Labs: Basic Metabolic Panel: Recent Labs  Lab 06/26/19 0450 06/27/19 0500 06/28/19 0400 06/29/19 0519 07/01/19 1725 07/01/19 1820 2019-07-27 0338 2019/07/27 0613  NA 137 136 133*  --  131*  --  136 137  K 4.5 4.3 4.6  --  5.0  --  4.7 4.8  CL 99 98 92*  --   --   --   --   --   CO2 _0 --   --   --   --   --   GLUCOSE 126* 138* 157*  --   --   --   --   --   BUN 23* 22* 28*  --   --   --   --   --   CREATININE 0.57 0.63 0.82 0.48  --   --   --   --  CALCIUM 8.3* 8.5* 8.7*  --   --   --   --   --   MG  --   --   --   --   --  2.0  --   --   PHOS  --   --   --   --   --  5.1*  --   --    GFR Estimated Creatinine Clearance: 77.7 mL/min (by C-G formula based on SCr of 0.48 mg/dL). Liver Function Tests: Recent Labs  Lab 06/26/19 0450 06/27/19 0500 06/28/19 0400  AST 12* 13* 19  ALT _0 ALKPHOS 78 92 88  BILITOT 0.5 0.7 0.9  PROT 6.5 6.7 6.6  ALBUMIN 2.7* 3.0* 2.8*   No results for input(s): LIPASE, AMYLASE in the last 168 hours. No results for input(s): AMMONIA in the last 168 hours. Coagulation profile No results for input(s): INR, PROTIME in the last 168 hours. COVID-19 Labs  Recent Labs    06/30/19 0450 07/01/19 0505  CRP 27.2* 38.1*    Lab Results  Component Value Date   SARSCOV2NAA POSITIVE (A) 06/20/2019   [place] NEGATIVE 04/25/2019   SARSCOV2NAA NOT DETECTED 03/31/2019   SARSCOV2NAA NOT DETECTED 03/16/2019    CBC: Recent Labs  Lab 06/28/19 0400 06/29/19 0524 06/30/19 0450 07/01/19 0505 07/01/19 1725 2019-07-25 0338 07-25-19 0540 Jul 25, 2019 0613  WBC 15.6* 8.6 10.4 11.3*  --   --  16.4*  --   NEUTROABS 14.3* 8.0* 9.5* 10.5*  --   --  15.1*  --   HGB 9.9* 8.5* 9.5* 8.5* 10.2* 8.8* 8.2* 8.2*  HCT 31.8*  28.1* 30.5* 27.7* 30.0* 26.0* 28.4* 24.0*  MCV 95.8 95.6 95.3 94.9  --   --  101.4*  --   PLT 157 114* 160 132*  --   --  181  --    Cardiac Enzymes: No results for input(s): CKTOTAL, CKMB, CKMBINDEX, TROPONINI in the last 168 hours. BNP (last 3 results) No results for input(s): PROBNP in the last 8760 hours. CBG: Recent Labs  Lab 07/01/19 2247 07/25/2019 0409  GLUCAP 94 82   D-Dimer: No results for input(s): DDIMER in the last 72 hours. Hgb A1c: No results for input(s): HGBA1C in the last 72 hours. Lipid Profile: No results for input(s): CHOL, HDL, LDLCALC, TRIG, CHOLHDL, LDLDIRECT in the last 72 hours. Thyroid function studies: No results for input(s): TSH, T4TOTAL, T3FREE, THYROIDAB in the last 72 hours.  Invalid input(s): FREET3 Anemia work up: No results for input(s): VITAMINB12, FOLATE, FERRITIN, TIBC, IRON, RETICCTPCT in the last 72 hours. Sepsis Labs: Recent Labs  Lab 06/26/19 0450 06/27/19 0500  06/29/19 0524 06/30/19 0450 07/01/19 0505 07-25-19 0540  PROCALCITON <0.10 <0.10  --   --   --  3.60  --   WBC 7.6 8.8   < > 8.6 10.4 11.3* 16.4*   < > = values in this interval not displayed.   Microbiology Recent Results (from the past 240 hour(s))  MRSA PCR Screening     Status: None   Collection Time: 06/25/19  9:58 AM   Specimen: Nasal Mucosa; Nasopharyngeal  Result Value Ref Range Status   MRSA by PCR NEGATIVE NEGATIVE Final    Comment:        The GeneXpert MRSA Assay (FDA approved for NASAL specimens only), is one component of a comprehensive MRSA colonization surveillance program. It is not intended to diagnose MRSA infection nor to guide or monitor treatment for MRSA infections. Performed  at [facility], [place] [address], [place], [place] [address]   Expectorated sputum assessment w rflx to resp cult     Status: None   Collection Time: 06/27/19  8:19 PM   Specimen: Sputum  Result Value Ref Range Status   Specimen Description SPU   Final   Special Requests Immunocompromised  Final   Sputum evaluation   Final    THIS SPECIMEN IS ACCEPTABLE FOR SPUTUM CULTURE Performed at [facility], [place] [address], [place], [place] [address]    Report Status 06/27/2019 FINAL  Final  Culture, respiratory     Status: None   Collection Time: 06/27/19  8:19 PM   Specimen: Sputum  Result Value Ref Range Status   Specimen Description   Final    SPU Performed at [place] [address], [place], [place] [address]    Special Requests   Final    Immunocompromised Reflexed from [phone] Performed at [facility], [place] [address], [place], [place] [address]    Gram Stain   Final    MODERATE WBC PRESENT, PREDOMINANTLY PMN MODERATE GRAM NEGATIVE RODS FEW GRAM POSITIVE COCCI IN CLUSTERS    Culture   Final    ABUNDANT ESCHERICHIA COLI FEW GROUP B STREP(S.AGALACTIAE)ISOLATED TESTING AGAINST S. AGALACTIAE NOT ROUTINELY PERFORMED DUE TO PREDICTABILITY OF AMP/PEN/VAN SUSCEPTIBILITY. Performed at [facility], [place] [address] [place], [place] [address]    Report Status 06/30/2019 FINAL  Final   Organism ID, Bacteria ESCHERICHIA COLI  Final      Susceptibility   Escherichia coli - MIC*    AMPICILLIN >=32 RESISTANT Resistant     CEFAZOLIN <=4 SENSITIVE Sensitive     CEFEPIME <=1 SENSITIVE Sensitive     CEFTAZIDIME <=1 SENSITIVE Sensitive     CEFTRIAXONE <=1 SENSITIVE Sensitive     CIPROFLOXACIN <=0.25 SENSITIVE Sensitive     GENTAMICIN <=1 SENSITIVE Sensitive     IMIPENEM <=0.25 SENSITIVE Sensitive     TRIMETH/SULFA <=20 SENSITIVE Sensitive     AMPICILLIN/SULBACTAM 16 INTERMEDIATE Intermediate     PIP/TAZO <=4 SENSITIVE Sensitive     Extended ESBL NEGATIVE Sensitive     * ABUNDANT ESCHERICHIA COLI     Medications:   . sodium chloride   Intravenous Once  . aerochamber plus with mask  1 each Other Once  . aspirin  81 mg Per Tube Daily  . chlorhexidine gluconate  (MEDLINE KIT)  15 mL Mouth Rinse BID  . Chlorhexidine Gluconate Cloth  6 each Topical Daily  . enoxaparin (LOVENOX) injection  40 mg Subcutaneous Q12H  . feeding supplement (PRO-STAT SUGAR FREE 64)  30 mL Per Tube BID  . feeding supplement (VITAL HIGH PROTEIN)  1,000 mL Per Tube Q24H  . FLUoxetine  80 mg Per Tube Daily  . guaiFENesin  15 mL Per Tube QID  . ipratropium-albuterol  3 mL Nebulization Q6H WA  . lidocaine  1 patch Transdermal Q24H  . mouth rinse  15 mL Mouth Rinse 10 times per day  . methylPREDNISolone (SOLU-MEDROL) injection  20 mg Intravenous Daily  . nystatin   Topical TID  . oxymetazoline  1 spray Each Nare BID  . pantoprazole sodium  40 mg Per Tube Daily  . polyethylene glycol  17 g Per Tube Daily  . pregabalin  75 mg Per Tube TID  . senna-docusate  2 tablet Per Tube BID  . sodium chloride flush  10-40 mL Intracatheter Q12H  . sucralfate  1 g Per Tube TID   Continuous Infu   LOS: 10 days   Charlynne Cousins  [facility]  July 12, 2019, 7:24 AM

## 2019-07-20 NOTE — Progress Notes (Signed)
Called and updated pts spouse on pts declining status and requirements of multiple new medications. All questions answered and emotional support offered at this time.

## 2019-07-20 NOTE — Procedures (Signed)
Extubation Procedure Note  Patient Details:   Name: JENNETTA FLOOD DOB: [birth year] MRN: [id number]   Airway Documentation:    Vent end date: 07/23/2019 Vent end time: 1530   Evaluation  O2 sats: transiently fell during during procedure Complications: No apparent complications Patient did tolerate procedure well. Bilateral Breath Sounds: Diminished   No.  Pt extubated to comfrot care per family request/physician order. Pt suctioned via ETT and orally prior. Pt VS transiently fell during procedure as to be expected.   Sharla Kidney July 23, 2019, 3:45 PM

## 2019-07-20 NOTE — Progress Notes (Signed)
RT called to room to place arterial line per verbal order to RN.  Two therapist attempted to place arterial line with no success in threading/ advancing catheter. RT advise RN present to contact MD to advise of outcome.

## 2019-07-20 NOTE — Care Plan (Signed)
Paged critical care MD r/t/t pt blood pressure not holding. Orders received. Pt put on support pressors for BP. Labs collected. Pt alert through  This process.

## 2019-07-20 NOTE — Procedures (Signed)
Central Venous Catheter Insertion Procedure Note Susan Houston [id number] Oct 09, 1961  Procedure: Insertion of Central Venous Catheter Indications: Assessment of intravascular volume  Procedure Details Consent: Unable to obtain consent because of emergent medical necessity. Time Out: Verified patient identification, verified procedure, site/side was marked, verified correct patient position, special equipment/implants available, medications/allergies/relevent history reviewed, required imaging and test results available.  Performed  Maximum sterile technique was used including antiseptics, cap, gloves, gown, hand hygiene, mask and sheet. Skin prep: Chlorhexidine; local anesthetic administered A antimicrobial bonded/coated triple lumen catheter was placed in the right subclavian vein using the Seldinger technique.  Ultrasound was used to verify the patency of the vein and for real time needle guidance.  Evaluation Blood flow good Complications: No apparent complications Patient did tolerate procedure well. Chest X-ray ordered to verify placement.  CXR: pending.  Simonne Maffucci 07/07/19, 12:23 PM

## 2019-07-20 NOTE — Progress Notes (Signed)
Wasted 253mL of Fentanyl in [place] with 2nd RN, Estil Daft.

## 2019-07-20 NOTE — Care Plan (Signed)
ART inserted by CRNA for better monitoring of BP. > ART reflecting correct pt BP.

## 2019-07-20 NOTE — Progress Notes (Signed)
Dr. Lake Bells and Anderson Malta RN notified of critical ABG results and Potassium of 6.9. No new orders received at this time. RT will continue to monitor.

## 2019-07-20 NOTE — Progress Notes (Addendum)
[facility] PCCM  Husband has visited patient Plan to stop all medications, withdraw ventilator support as vasopressors, mechanical ventilation are providing no medical benefit at this point.  Husband aware per our conversation earlier today, and I called again to let him know that I believe she will pass within the hour.  He was extremely grateful for all the care his wife received.    Roselie Awkward, MD [place] PCCM Pager: [phone] Cell: [phone] If no response, call [phone]

## 2019-07-20 NOTE — Care Management (Signed)
07/03/19 8:52am case manager accessed chart to complete notes. May God rest her dear soul.   Ricki Miller RN BSN Case Manager [phone]

## 2019-07-20 NOTE — Progress Notes (Signed)
Wasted 45mL of Midazolam in [place] with 2nd RN, Estil Daft

## 2019-07-20 NOTE — Discharge Summary (Signed)
Death Summary  Susan Houston PNT:[id number] DOB: July 29, 1961 DOA: Jul 06, 2019  PCP: Valerie Roys, DO  Admit date: 2019/07/06 Date of Death: 07/16/2019 Time of Death: 15:45 Notification: Valerie Roys, DO notified of death of 2019/07/16   History of present illness:  Susan Houston is a 58 y.o. female with a history of CAD, COPD on 3 L of oxygen metastatic squamous cell carcinoma of the lung diagnosed in 2019 status post resection of laryngeal cancer, undergoing chemo and radiation therapy and chronic pain who presents to the ED on 06/21/2019 for increased shortness of breath cough with a chest x-ray showing bilateral infiltrates and requiring 6 L of oxygen to keep saturations greater than 90%, her CRP on admission was 19 with normal procalcitonin she was started on IV steroids and Remdesivir.  Significant events: Admitted to [facility] on 07-06-2019 Transferred to the ICU on 06/27/2019. Intubated on 07/01/2019 Respiratory culture grew E. coli and strep  Agalactiae on 06/27/2019 07/01/2019 Rocephin 07-16-19 07-16-19 IV vancomycin and cefepime   COVID-19 medications: IV Remdesivir for 5 days completed IV steroids for 10 days She received 2 doses of Actemra Received convalescent plasma 06/24/2019 Final Diagnoses:  Acute respiratory failure with hypoxia due to COVID-19 viral infection and possible strep Agalactea pneumonia: She is started on IV steroids and IV Remdesivir she completed her course of IV Remdesivir, Actemra and steroids she was also given convalescent plasma 06/24/2019, transferred to the ICU on 06/24/2019 due to worsening shortness of breath and respiratory distress.  She was placed on heated high flow and her saturations remained stable greater than 89% for a few days then oxygen demand started to increase.  The patient was unable to prone.  She had a respiratory culture that grew E. coli and strep Agalactea she started empirically on antibiotics her procalcitonin was elevated at  3.6. Due to her respiratory difficulty she was intubated as the patient and her husband requested for her to be intubated temporarily. She became septic her antibiotic coverage were broadened to IV vancomycin and cefepime she was also covered with antifungal. Even with full support and being on 3 pressors she continued to deteriorate, critical care spoke with the husband and the husband decided to move towards full comfort care. She was terminally extubated and she passed away on the time and date above.  See progress note from 07-16-19 for further details on the rest of the problems.    The results of significant diagnostics from this hospitalization (including imaging, microbiology, ancillary and laboratory) are listed below for reference.    Significant Diagnostic Studies: Dg Chest 2 View  Result Date: 06/14/2019 CLINICAL DATA:  Shortness of breath. Personal history of right-sided lung cancer. Status post right lower lobectomy. EXAM: CHEST - 2 VIEW COMPARISON:  Two-view chest x-ray 04/05/2019 FINDINGS: The heart size is normal. Left subclavian Port-A-Cath is stable. Perihilar opacities are slightly more prominent than on the prior study. There is atelectasis or scarring at the right base. Upper lung fields are clear. Degenerative changes are noted at the shoulders. IMPRESSION: 1. Perihilar opacities bilaterally are concerning for infection/inflammation. 2. Mild pulmonary vascular congestion. 3. Stable Port-A-Cath. Electronically Signed   By: San Morelle M.D.   On: 06/14/2019 15:49   Dg Chest Port 1 View  Result Date: 07/01/2019 CLINICAL DATA:  Endotracheal tube and NG tube placement. Respiratory failure. EXAM: PORTABLE CHEST 1 VIEW COMPARISON:  06/27/2019 and prior radiographs FINDINGS: An endotracheal tube with tip 2.5 cm above the carina and NG  tube in the stomach now noted. A LEFT subclavian Port-A-Cath with tip overlying the SUPERIOR cavoatrial junction again noted. Diffuse  opacities within the LEFT lung again noted as well as mild patchy opacity/airspace disease within the RIGHT UPPER lung. No evidence of pneumothorax. IMPRESSION: Endotracheal tube and NG tube placement as described. Unchanged diffuse LEFT lung airspace disease/opacities and mild patchy opacity/airspace disease within the RIGHT UPPER lung. Electronically Signed   By: Margarette Canada M.D.   On: 07/01/2019 16:17   Dg Chest Port 1 View  Result Date: 06/27/2019 CLINICAL DATA:  Acute respiratory failure with hypoxia. EXAM: PORTABLE CHEST 1 VIEW COMPARISON:  Radiograph of June 25, 2019. FINDINGS: The heart size and mediastinal contours are within normal limits. Left subclavian Port-A-Cath is unchanged in position. No pneumothorax is noted. Stable diffuse left lung opacity is noted concerning for pneumonia. Slightly increased right upper lobe airspace opacity is noted. The visualized skeletal structures are unremarkable. IMPRESSION: Stable left lung opacity is noted concerning for pneumonia. Slightly increased right upper lobe airspace opacity is noted. Electronically Signed   By: Marijo Conception M.D.   On: 06/27/2019 07:38   Dg Chest Port 1 View  Result Date: 06/25/2019 CLINICAL DATA:  Hypoxemia EXAM: PORTABLE CHEST 1 VIEW COMPARISON:  06/24/2019 FINDINGS: Left Port-A-Cath remains in place, unchanged. Diffuse airspace disease throughout the left lung, worsened since prior study. Right basilar opacities are stable. No acute bony abnormality. No effusions. IMPRESSION: Diffuse left lung airspace disease, slightly worsened since prior study. Appearance is suggestive of pneumonia. Right basilar scarring or infiltrate. Electronically Signed   By: Rolm Baptise M.D.   On: 06/25/2019 00:45   Dg Chest Port 1 View  Result Date: 06/24/2019 CLINICAL DATA:  Acute respiratory distress secondary to COVID-19 pneumonia. EXAM: PORTABLE CHEST 1 VIEW COMPARISON:  Single-view of the chest 06/21/2019 and PA and lateral chest 06/14/2019.  CT chest 05/08/2019. FINDINGS: The lungs are emphysematous. Airspace disease throughout the left chest appears unchanged. Heart size is normal. Mild scar is seen in the right lung base where there is some suture material. No pneumothorax or pleural fluid. Heart size is normal. Atherosclerosis. Port-A-Cath noted. IMPRESSION: No change in airspace disease throughout the left chest consistent with pneumonia. Emphysema. Electronically Signed   By: Inge Rise M.D.   On: 06/24/2019 11:51   Dg Chest Portable 1 View  Result Date: 06/21/2019 CLINICAL DATA:  Increased shortness of breath. Productive cough. EXAM: PORTABLE CHEST 1 VIEW COMPARISON:  June 14, 2019 FINDINGS: Injectable port in stable position. Cardiomediastinal silhouette is normal. Mediastinal contours appear intact. Stable postsurgical changes in the right hemithorax with hyperlucent appearance of the remaining lung. Interval development of patchy airspace and interstitial opacities throughout the left lung, in the lower portion of the right lung. Osseous structures are without acute abnormality. Soft tissues are grossly normal. IMPRESSION: 1. Interval development of patchy airspace and interstitial opacities throughout the left lung, and lower portion of the right lung. 2. Stable postsurgical changes in the right hemithorax. Electronically Signed   By: Fidela Salisbury M.D.   On: 06/21/2019 17:07    Microbiology: Recent Results (from the past 240 hour(s))  MRSA PCR Screening     Status: None   Collection Time: 06/25/19  9:58 AM   Specimen: Nasal Mucosa; Nasopharyngeal  Result Value Ref Range Status   MRSA by PCR NEGATIVE NEGATIVE Final    Comment:        The GeneXpert MRSA Assay (FDA approved for NASAL specimens only), is  one component of a comprehensive MRSA colonization surveillance program. It is not intended to diagnose MRSA infection nor to guide or monitor treatment for MRSA infections. Performed at [facility], [place] [address], [place], [place] [address]   Expectorated sputum assessment w rflx to resp cult     Status: None   Collection Time: 06/27/19  8:19 PM   Specimen: Sputum  Result Value Ref Range Status   Specimen Description SPU  Final   Special Requests Immunocompromised  Final   Sputum evaluation   Final    THIS SPECIMEN IS ACCEPTABLE FOR SPUTUM CULTURE Performed at [facility], [place] [address] [place], [place] [address]    Report Status 06/27/2019 FINAL  Final  Culture, respiratory     Status: None   Collection Time: 06/27/19  8:19 PM   Specimen: Sputum  Result Value Ref Range Status   Specimen Description   Final    SPU Performed at [facility] [address] [place], [place] [address]    Special Requests   Final    Immunocompromised Reflexed from [id number] Performed at [facility], [place] [address] [place], [place] [address]    Gram Stain   Final    MODERATE WBC PRESENT, PREDOMINANTLY PMN MODERATE GRAM NEGATIVE RODS FEW GRAM POSITIVE COCCI IN CLUSTERS    Culture   Final    ABUNDANT ESCHERICHIA COLI FEW GROUP B STREP(S.AGALACTIAE)ISOLATED TESTING AGAINST S. AGALACTIAE NOT ROUTINELY PERFORMED DUE TO PREDICTABILITY OF AMP/PEN/VAN SUSCEPTIBILITY. Performed at [facility], [place] [address] [place], [place] [address]    Report Status 06/30/2019 FINAL  Final   Organism ID, Bacteria ESCHERICHIA COLI  Final      Susceptibility   Escherichia coli - MIC*    AMPICILLIN >=32 RESISTANT Resistant     CEFAZOLIN <=4 SENSITIVE Sensitive     CEFEPIME <=1 SENSITIVE Sensitive     CEFTAZIDIME <=1 SENSITIVE Sensitive     CEFTRIAXONE <=1 SENSITIVE Sensitive     CIPROFLOXACIN <=0.25 SENSITIVE Sensitive     GENTAMICIN <=1 SENSITIVE Sensitive     IMIPENEM <=0.25 SENSITIVE Sensitive     TRIMETH/SULFA <=20 SENSITIVE Sensitive     AMPICILLIN/SULBACTAM 16 INTERMEDIATE Intermediate     PIP/TAZO <=4 SENSITIVE Sensitive      Extended ESBL NEGATIVE Sensitive     * ABUNDANT ESCHERICHIA COLI     Labs: Basic Metabolic Panel: Recent Labs  Lab 06/26/19 0450 06/27/19 0500 06/28/19 0400 06/29/19 0519 07/01/19 1725 07/01/19 1820 2019/08/01 0338 01-Aug-2019 0540 08/01/19 0613  NA 137 136 133*  --  131*  --  136 137 137  K 4.5 4.3 4.6  --  5.0  --  4.7 5.1 4.8  CL 99 98 92*  --   --   --   --  98  --   CO2 30 30 30   --   --   --   --  24  --   GLUCOSE 126* 138* 157*  --   --   --   --  83  --   BUN 23* 22* 28*  --   --   --   --  33*  --   CREATININE 0.57 0.63 0.82 0.48  --   --   --  1.39*  --   CALCIUM 8.3* 8.5* 8.7*  --   --   --   --  7.5*  --   MG  --   --   --   --   --  2.0  --  1.8  --   PHOS  --   --   --   --   --  5.1*  --  5.4*  --    Liver Function Tests: Recent Labs  Lab 06/26/19 0450 06/27/19 0500 06/28/19 0400  AST 12* 13* 19  ALT 12 13 17   ALKPHOS 78 92 88  BILITOT 0.5 0.7 0.9  PROT 6.5 6.7 6.6  ALBUMIN 2.7* 3.0* 2.8*   No results for input(s): LIPASE, AMYLASE in the last 168 hours. No results for input(s): AMMONIA in the last 168 hours. CBC: Recent Labs  Lab 06/28/19 0400 06/29/19 0524 06/30/19 0450 07/01/19 0505 07/01/19 1725 07/18/19 0338 2019/07/18 0540 Jul 18, 2019 0613 18-Jul-2019 1015  WBC 15.6* 8.6 10.4 11.3*  --   --  16.4*  --   --   NEUTROABS 14.3* 8.0* 9.5* 10.5*  --   --  15.1*  --   --   HGB 9.9* 8.5* 9.5* 8.5* 10.2* 8.8* 8.2* 8.2*  --   HCT 31.8* 28.1* 30.5* 27.7* 30.0* 26.0* 28.4* 24.0*  --   MCV 95.8 95.6 95.3 94.9  --   --  101.4*  --   --   PLT 157 114* 160 132*  --   --  181  --  167   Cardiac Enzymes: No results for input(s): CKTOTAL, CKMB, CKMBINDEX, TROPONINI in the last 168 hours. D-Dimer Recent Labs    07-18-2019 1015  DDIMER 11.17*   BNP: Invalid input(s): POCBNP CBG: Recent Labs  Lab 07/01/19 2247 07-18-19 0409  GLUCAP 94 82   Anemia work up No results for input(s): VITAMINB12, FOLATE, FERRITIN, TIBC, IRON, RETICCTPCT in the last 72  hours. Urinalysis    Component Value Date/Time   COLORURINE RED (A) Jul 18, 2019 0254   APPEARANCEUR CLOUDY (A) July 18, 2019 0254   APPEARANCEUR Clear 03/09/2019 1344   LABSPEC 1.012 2019/07/18 0254   LABSPEC 1.012 10/19/2013 1810   PHURINE 6.0 2019/07/18 0254   GLUCOSEU NEGATIVE 2019/07/18 0254   GLUCOSEU Negative 10/19/2013 1810   HGBUR MODERATE (A) 07/18/19 0254   BILIRUBINUR NEGATIVE 07/18/19 0254   BILIRUBINUR Negative 03/09/2019 1344   BILIRUBINUR Negative 10/19/2013 1810   KETONESUR NEGATIVE 07-18-19 0254   PROTEINUR NEGATIVE 07-18-2019 0254   UROBILINOGEN 1.0 09/07/2014 1157   NITRITE NEGATIVE 07/18/19 0254   LEUKOCYTESUR LARGE (A) Jul 18, 2019 0254   LEUKOCYTESUR 1+ 10/19/2013 1810   Sepsis Labs Invalid input(s): PROCALCITONIN,  WBC,  LACTICIDVEN     SIGNED:  Charlynne Cousins, MD  [facility] 07/18/2019, 4:55 PM Pager   If 7PM-7AM, please contact night-coverage [email or web address] Password [email or web address]

## 2019-07-20 NOTE — Progress Notes (Signed)
Brief Nutrition Note RD working remotely.    Consult received for enteral/tube feeding initiation and management. Patient was assessed by another RD on 9/10. Order placed per TF protocol yesterday evening for Vital High Protein @ 40 ml/hr with 30 ml prostat BID (1160 kcal, 114 grams protein, 802 ml free water).   RN note from 11:30 AM today states patient continues to decline and maxed on pressors. Patient changed to DNR status and family to visit patient. RD will not change anything concerning TF at this time and full assessment to follow tomorrow (9/14), if warranted.    Admitting Dx: Covid Positive  Body mass index is 35.2 kg/m. Pt meets criteria for obesity based on current BMI.  Labs:  Recent Labs  Lab 06/27/19 0500 06/28/19 0400 06/29/19 0519  07/01/19 1820 2019/07/14 0338 2019-07-14 0540 07/14/2019 0613  NA 136 133*  --    < >  --  136 137 137  K 4.3 4.6  --    < >  --  4.7 5.1 4.8  CL 98 92*  --   --   --   --  98  --   CO2 30 30  --   --   --   --  24  --   BUN 22* 28*  --   --   --   --  33*  --   CREATININE 0.63 0.82 0.48  --   --   --  1.39*  --   CALCIUM 8.5* 8.7*  --   --   --   --  7.5*  --   MG  --   --   --   --  2.0  --  1.8  --   PHOS  --   --   --   --  5.1*  --  5.4*  --   GLUCOSE 138* 157*  --   --   --   --  83  --    < > = values in this interval not displayed.       Jarome Matin, MS, RD, LDN, [facility] Inpatient Clinical Dietitian Pager # [phone] After hours/weekend pager # [phone]

## 2019-07-20 NOTE — Progress Notes (Signed)
Pharmacy Antibiotic Note  Susan Houston is a 58 y.o. female admitted on 06/24/2019 with pneumonia.  Pharmacy has been consulted for cefepime and vancomycin dosing.  Plan:  Anidulafungin 200mg  IV x1 then 100mg  IV q24h  Cefepime 2g IV q12h  Vancomycin 1500mg  IV x1 then 750mg  IV q24h.  Measure Vanc peak and trough at steady state.  Goal AUC = 400 - 550.  Expected AUC 524 using SCr 1.4   Follow up renal function, culture results, and clinical course.    Height: 5\' 2"  (157.5 cm) Weight: 192 lb 7.4 oz (87.3 kg) IBW/kg (Calculated) : 50.1  Temp (24hrs), Avg:100.2 F (37.9 C), Min:97.7 F (36.5 C), Max:103.2 F (39.6 C)  Recent Labs  Lab 06/26/19 0450 06/27/19 0500 06/28/19 0400 06/29/19 0519 06/29/19 0524 06/30/19 0450 07/01/19 0505 07-17-19 0540  WBC 7.6 8.8 15.6*  --  8.6 10.4 11.3* 16.4*  CREATININE 0.57 0.63 0.82 0.48  --   --   --  1.39*    Estimated Creatinine Clearance: 44.7 mL/min (A) (by C-G formula based on SCr of 1.39 mg/dL (H)).    Allergies  Allergen Reactions  . Morphine And Related Itching    Can tolerate with benadryl  . Cymbalta [Duloxetine Hcl] Other (See Comments)    Pt states it keeps her awake  . Adhesive [Tape] Other (See Comments)    Plastic tape rips skin and bruises her. PLEASE USE PAPER TAPE  . Lexapro [Escitalopram Oxalate] Other (See Comments)    Keeps awake for days.     Antimicrobials this admission: 9/3 remdesivir >> 9/6 9/12 ceftriaxone 2g >> 9/13 9/13 cefepime >> 9/13 Vanc >> 9/13 anidulafungin >>   Dose adjustments this admission:  Microbiology results: 9/6 MRSA PCR: neg 9/8 Sputum: mod GNR, few GPC clusters >> Cxt abundant EColi (MD notified 9/10, no abx added), few Group B strep 9/13 UCx:    Thank you for allowing pharmacy to be a part of this patient's care.  Gretta Arab PharmD, BCPS Clinical pharmacist phone 7am- 5pm: [phone] 07/17/2019 9:38 AM

## 2019-07-20 NOTE — Progress Notes (Addendum)
[facility] Progress Note Patient Name: Susan Houston DOB: 1960/10/28 MRN: [id number]   Date of Service  07/23/2019  HPI/Events of Note  Notified of hypotension.  58/F with COVID pneumonia, with recurrent squamous cell lung CA, intubated earlier today.  Pt on levophed and still hypotensive.  eICU Interventions  Give 2nd liter of NS bolus now.  Start vasopressin.  If MAPs remain less than 60, would start neosynephrine.  Discontinue precedex. Start on Versed gtt.  Continue fentanyl gtt.     Intervention Category Major Interventions: Hypotension - evaluation and management  Elsie Lincoln July 23, 2019, 12:48 AM   3:47 AM ABG 7.17/78.2/94/27.8 on TV 350, RR 18, PEEP 10, 100%.  Plan> Increase tidal volume to 400. Insert arterial line. Recheck ABG.    6:34 AM Follow up ABG 7.179/68.7/25. Pt is on 3 pressors.   Plan> Increase tidal volume further to 450.  Check ABG in 1 hour.

## 2019-07-20 NOTE — Progress Notes (Signed)
NAME:  Susan Houston, MRN:  [id number], DOB:  Oct 12, 1961, LOS: 43 ADMISSION DATE:  06/20/2019, CONSULTATION DATE:  06/24/2019 REFERRING MD:  Bonner Puna, CHIEF COMPLAINT:  COVID 5 pneumonia   Brief History   58 year old female diagnosed with COVID-19 pneumonia on September 4, has underlying history of recurrent squamous cell carcinoma of the lung.  Transferred to [facility] for further management of hypoxemic respiratory failure.  Past Medical History  Squamous cell carcinoma of right lower lobe, status post surgical resection in July 2019.  Mild cognitive impairment.  Laryngeal cancer 2009.  Hyperlipidemia.  Kidney stones.  GERD.  Diverticulitis.  COPD, on 2 L nasal cannula at night.  Anxiety.  Significant Hospital Events   Transferred to intensive care unit and received convalescent plasma on September 5 September 12 acute decompensation, required intubation, mechanical ventilation, vasopressor  Consults:  PCCM  Procedures:    Significant Diagnostic Tests:    Micro Data:  September 1 SARS-CoV-2 positive  Antimicrobials:  Remdesivir 9/2 >  9/6 Solumedrol 9/2 >   Interim history/subjective:   Worsening hypotension overnight Renal failure Metabolic acidosis Placed on multiple vasopressors   Objective   Blood pressure (!) 86/54, pulse (!) 120, temperature 99.1 F (37.3 C), temperature source Oral, resp. rate 18, height 5\' 2"  (1.575 m), weight 87.3 kg, SpO2 100 %.    Vent Mode: PRVC FiO2 (%):  [65 %-100 %] 100 % Set Rate:  [18 bmp] 18 bmp Vt Set:  [350 mL-400 mL] 400 mL PEEP:  [10 cmH20] 10 cmH20 Plateau Pressure:  [14 cmH20-27 cmH20] 17 cmH20   Intake/Output Summary (Last 24 hours) at July 20, 2019 0747 Last data filed at 2019/07/20 0527 Gross per 24 hour  Intake 6176.17 ml  Output 3500 ml  Net 2676.17 ml   Filed Weights   07/09/2019 0501 06/25/19 0120 06/28/19 0418  Weight: 85.9 kg 87.7 kg 87.3 kg    Examination:  General:  In bed on vent HENT: NCAT ETT in  place PULM: CTA B, vent supported breathing CV: RRR, no mgr GI: BS+, soft, nontender MSK: normal bulk and tone Neuro: sedated on vent   9/8 CXR reviewed: left lung infiltrate, now ? New R mid lung infiltrate  Resolved Hospital Problem list     Assessment & Plan:  ARDS from COVID-19 pneumonia: severe hypoxemia, worsened 2/67; complicated by chest congestion Continue full mechanical ventilatory support Moving towards comfort measures Ventilator associated pneumonia prevention   Ecoli pneumonia Broad-spectrum antibiotics  Septic shock: Continue vasopressin, Levophed, epinephrine, and Neo-Synephrine titrated to mean arterial pressure greater than 65 Placed central line, check CVP Bicarb drip  Acute kidney injury: At this point not a dialysis candidate given stage IV cancer and severe hypotension.  COPD: not clearly in exacerbation combivent QID  Goals of care/CODE STATUS: DNR, I advised her husband that at this point we are using all vasopressors possible and she is still decompensating, she will not survive this illness.  I have offered a visit to see her one last time, he says he will come in for  Best practice:  Diet: regular diet Pain/Anxiety/Delirium protocol (if indicated): n/a VAP protocol (if indicated): n/a DVT prophylaxis: lovenox GI prophylaxis: Pantoprazole for stress ulcer prophylaxis Glucose control: SSI Mobility: out of bed to chair Code Status:DNR Family Communication: see above Disposition:   Labs   CBC: Recent Labs  Lab 06/28/19 0400 06/29/19 0524 06/30/19 0450 07/01/19 0505 07/01/19 1725 Jul 20, 2019 0338 2019-07-20 0540 07/20/19 0613  WBC 15.6* 8.6 10.4 11.3*  --   --  16.4*  --   NEUTROABS 14.3* 8.0* 9.5* 10.5*  --   --  15.1*  --   HGB 9.9* 8.5* 9.5* 8.5* 10.2* 8.8* 8.2* 8.2*  HCT 31.8* 28.1* 30.5* 27.7* 30.0* 26.0* 28.4* 24.0*  MCV 95.8 95.6 95.3 94.9  --   --  101.4*  --   PLT 157 114* 160 132*  --   --  181  --     Basic Metabolic  Panel: Recent Labs  Lab 06/26/19 0450 06/27/19 0500 06/28/19 0400 06/29/19 0519 07/01/19 1725 07/01/19 1820 07/17/19 0338 07/17/2019 0540 07-17-19 0613  NA 137 136 133*  --  131*  --  136  --  137  K 4.5 4.3 4.6  --  5.0  --  4.7  --  4.8  CL 99 98 92*  --   --   --   --   --   --   CO2 30 30 30   --   --   --   --   --   --   GLUCOSE 126* 138* 157*  --   --   --   --   --   --   BUN 23* 22* 28*  --   --   --   --   --   --   CREATININE 0.57 0.63 0.82 0.48  --   --   --   --   --   CALCIUM 8.3* 8.5* 8.7*  --   --   --   --   --   --   MG  --   --   --   --   --  2.0  --  1.8  --   PHOS  --   --   --   --   --  5.1*  --  5.4*  --    GFR: Estimated Creatinine Clearance: 77.7 mL/min (by C-G formula based on SCr of 0.48 mg/dL). Recent Labs  Lab 06/26/19 0450 06/27/19 0500  06/29/19 0524 06/30/19 0450 07/01/19 0505 07/17/19 0540  PROCALCITON <0.10 <0.10  --   --   --  3.60 38.60  WBC 7.6 8.8   < > 8.6 10.4 11.3* 16.4*   < > = values in this interval not displayed.    Liver Function Tests: Recent Labs  Lab 06/26/19 0450 06/27/19 0500 06/28/19 0400  AST 12* 13* 19  ALT 12 13 17   ALKPHOS 78 92 88  BILITOT 0.5 0.7 0.9  PROT 6.5 6.7 6.6  ALBUMIN 2.7* 3.0* 2.8*   No results for input(s): LIPASE, AMYLASE in the last 168 hours. No results for input(s): AMMONIA in the last 168 hours.  ABG    Component Value Date/Time   PHART 7.179 (LL) 2019/07/17 0613   PCO2ART 68.7 (HH) 07-17-2019 0613   PO2ART 96.0 07-17-19 0613   HCO3 25.0 07/17/2019 0613   TCO2 27 07-17-2019 0613   ACIDBASEDEF 3.0 (H) 2019/07/17 0613   O2SAT 94.0 Jul 17, 2019 0613     Coagulation Profile: No results for input(s): INR, PROTIME in the last 168 hours.  Cardiac Enzymes: No results for input(s): CKTOTAL, CKMB, CKMBINDEX, TROPONINI in the last 168 hours.  HbA1C: No results found for: HGBA1C  CBG: Recent Labs  Lab 07/01/19 2247 July 17, 2019 0409  GLUCAP 94 82      Critical care time: 50  minutes    Roselie Awkward, MD [place] Pager: [phone] Cell: [phone] If no response, call [phone]

## 2019-07-20 NOTE — Progress Notes (Signed)
Pt transported on ventilator with RN x2 to family comfort room for husband to visit. Pt tolerated well and will transition to comfort care upon arrival back to ICU. RT will continue to monitor.

## 2019-07-20 DEATH — deceased

## 2019-11-29 ENCOUNTER — Encounter: Payer: Medicare Other | Admitting: Pain Medicine

## 2019-12-01 IMAGING — CT CT CHEST WITH CONTRAST
2 of 4 series · 15 of 36 positions shown, 18 images · IV contrast (omnipaque)
Comparison: PET 02/28/2019 and CT chest 02/21/2019.

CLINICAL DATA: Right lung cancer.  Shortness of breath.

EXAM:
CT CHEST WITH CONTRAST
TECHNIQUE: Multidetector CT imaging of the chest was performed during
intravenous contrast administration.
CONTRAST:  75mL OMNIPAQUE IOHEXOL 300 MG/ML  SOLN

[Series 2: axial chest · axial · 0.76mm/px · z∈[-1143,-873]mm · 12 of 161 slices shown, 15 images]
[im 13/161  mediastinal]
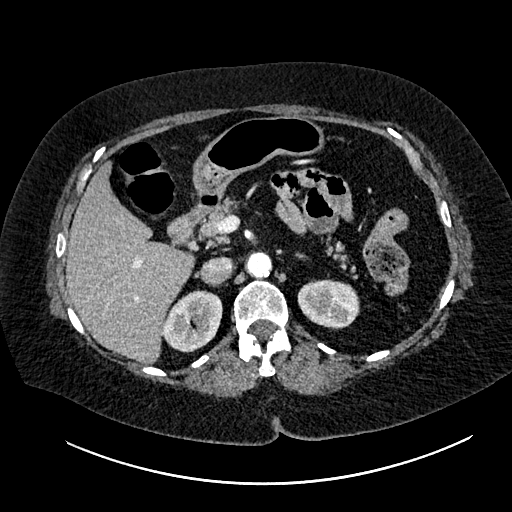
[im 13/161  lung]
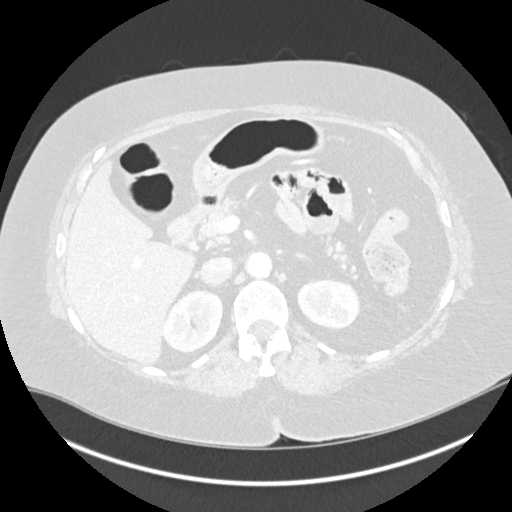
[im 25/161  lung]
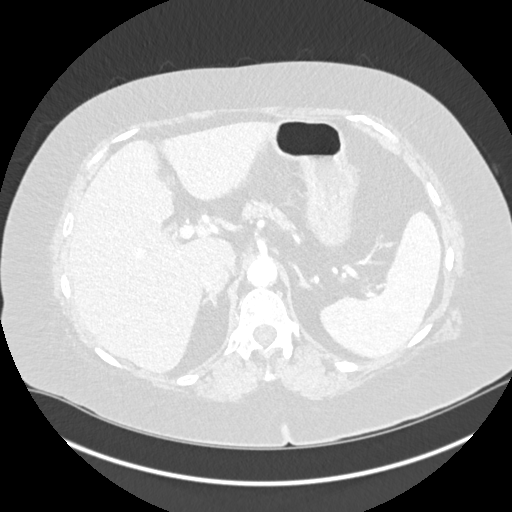
[im 37/161  lung]
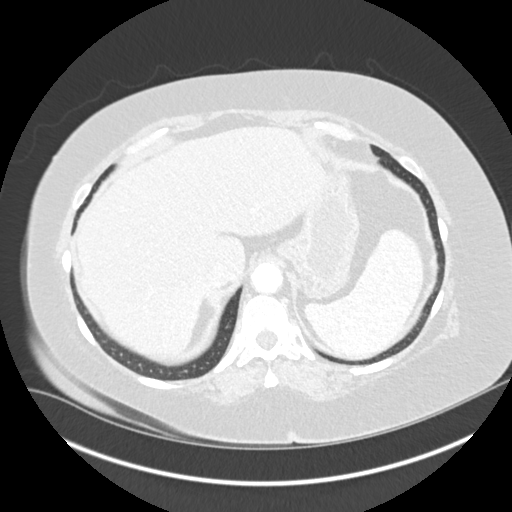
[im 50/161  lung]
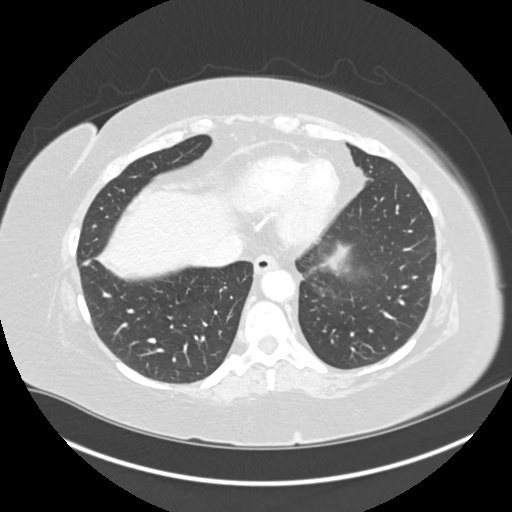
[im 62/161  mediastinal]
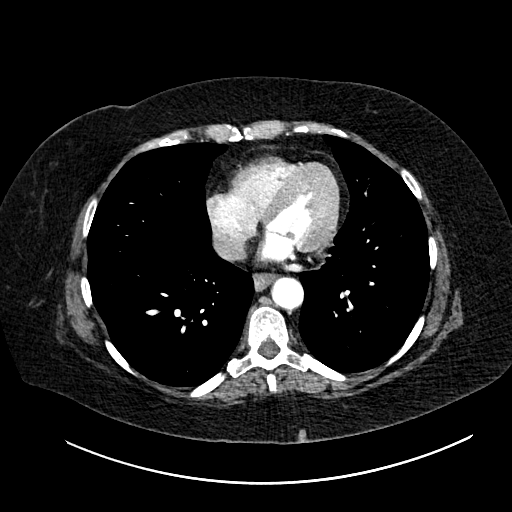
[im 62/161  lung]
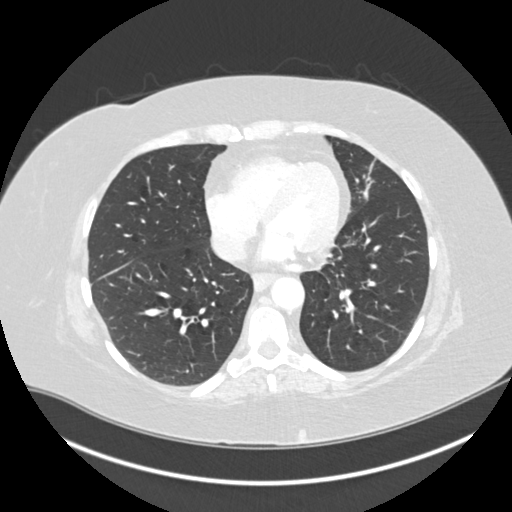
[im 74/161  lung]
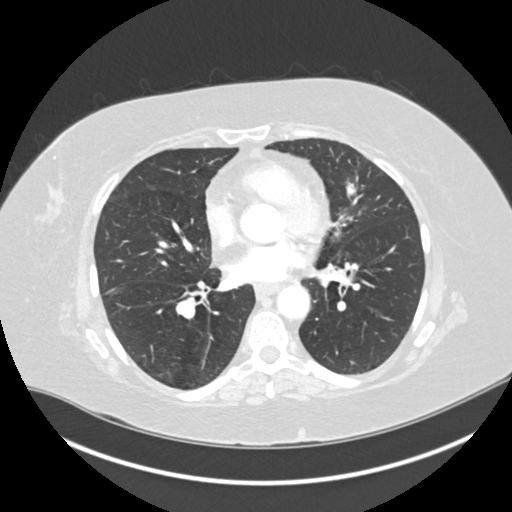
[im 87/161  lung]
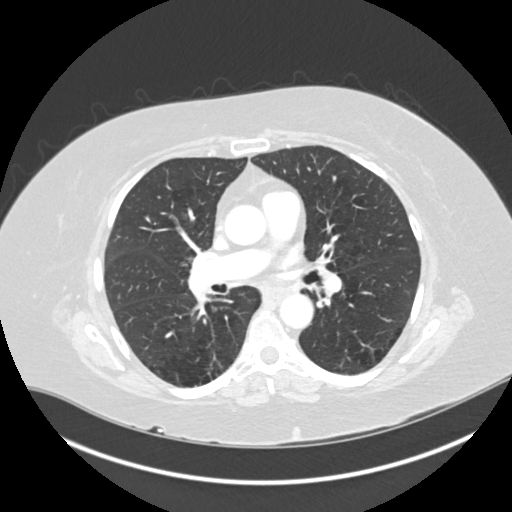
[im 99/161  lung]
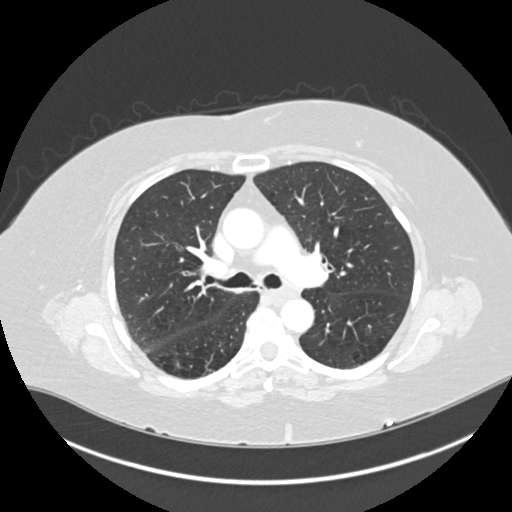
[im 111/161  mediastinal]
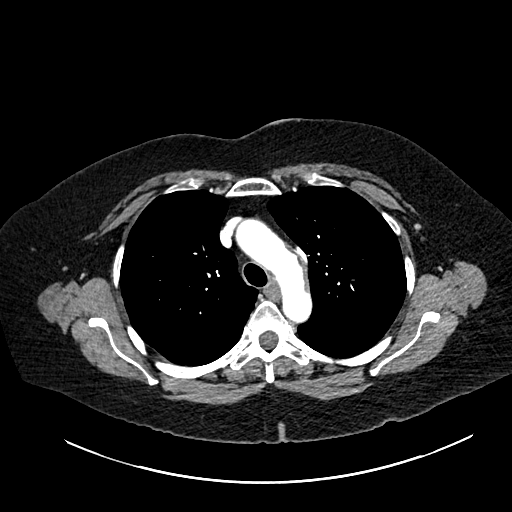
[im 111/161  lung]
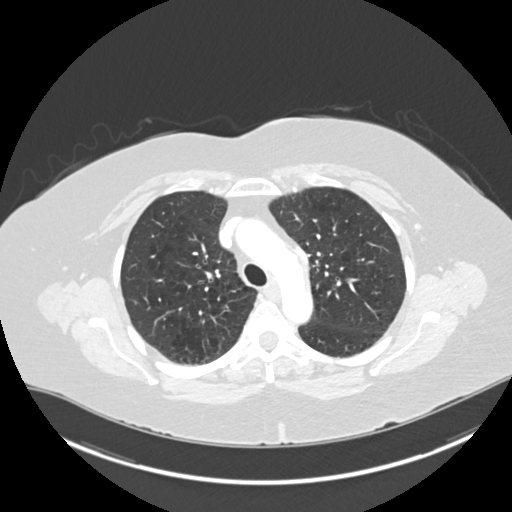
[im 124/161  lung]
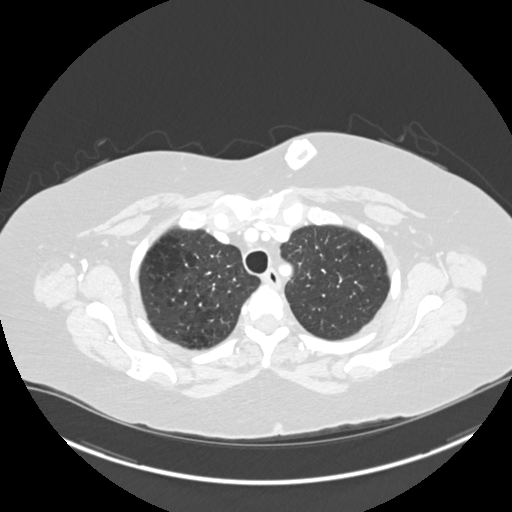
[im 136/161  lung]
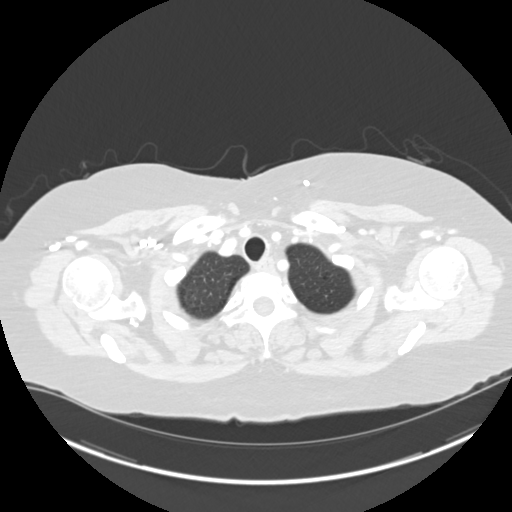
[im 148/161  lung]
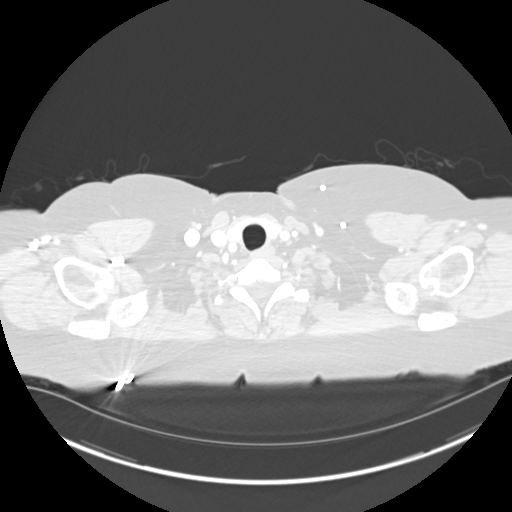

[Series 4: coronal chest · coronal · 0.63mm/px · 3 of 146 slices shown]
[im 30/146  lung]
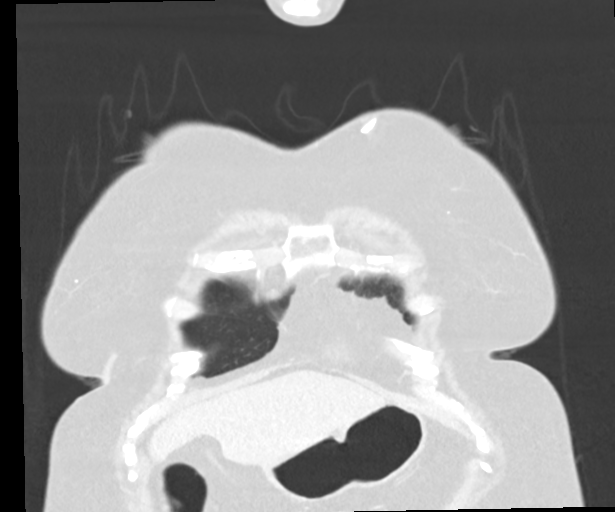
[im 59/146  lung]
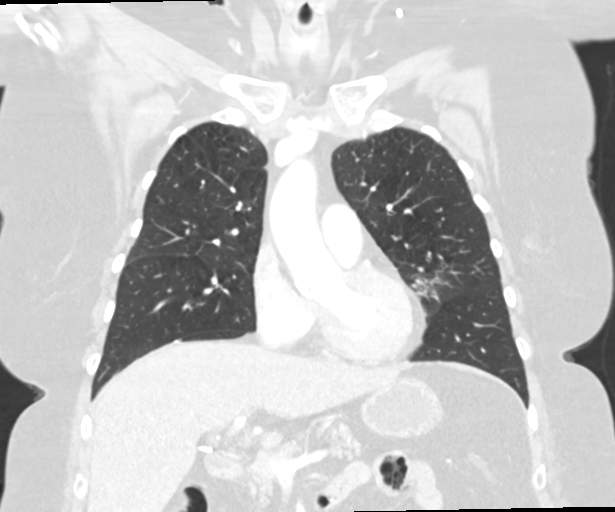
[im 88/146  lung]
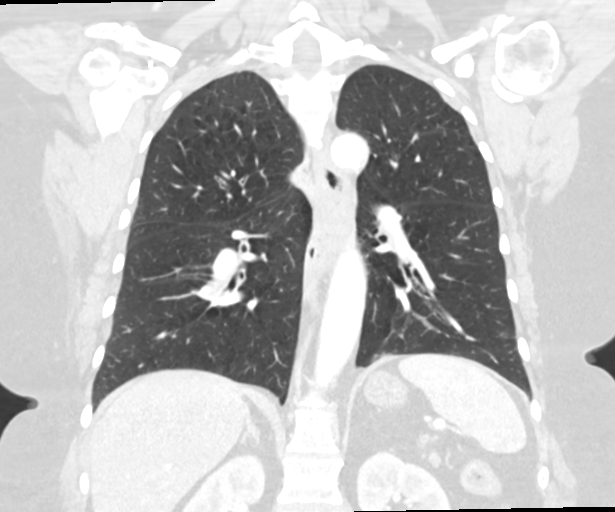

[15 of 36 positions shown; findings below may reference images not displayed]

FINDINGS: Cardiovascular: Left IJ Port-A-Cath terminates in the SVC.
Atherosclerotic calcification of the aorta and coronary arteries.
Partial anomalous pulmonary venous return is seen in the left upper
lobe. Heart size normal although the left ventricle appears
hypertrophied. No pericardial effusion.

Mediastinum/Nodes: No pathologically enlarged mediastinal lymph
nodes. Right hilar lymph node measures 10 mm, similar. Left hilar
lymphoid tissue without discrete adenopathy. No axillary adenopathy.
Esophagus is grossly unremarkable.

Lungs/Pleura: Centrilobular emphysema. Postoperative changes in the
lower right hemithorax. Peribronchovascular ground-glass and
nodularity in the lingula and central left lower lobe are new.
Associated fluid/debris in left lower lobe subsegmental bronchi. No
pleural fluid. There may be adherent debris in the upper trachea.
Airway is otherwise unremarkable.

Upper Abdomen: Visualized portions of the liver, adrenal glands,
kidneys, spleen, pancreas, stomach and bowel are grossly
unremarkable. Cholecystectomy. Periportal lymph nodes are not
enlarged by CT size criteria.

Musculoskeletal: No worrisome lytic or sclerotic lesions.
IMPRESSION: 1. Right hilar/infrahilar lymph nodes appear similar.
2. Fluid/debris in left lower lobe bronchi with new
peribronchovascular ground-glass and nodularity in the lingula and
left lower lobe. Findings are most indicative of an infectious
bronchiolitis/bronchopneumonia.
3. Aortic atherosclerosis (8TUZD-170.0). Coronary artery
calcification.
4.  Emphysema ([id number].7).

## 2020-01-14 IMAGING — DX DG CHEST 1V PORT
1 series · 1 of 1 positions shown · non-contrast
Comparison: June 14, 2019

CLINICAL DATA: Increased shortness of breath. Productive cough.

EXAM:
PORTABLE CHEST 1 VIEW

[chest ap]
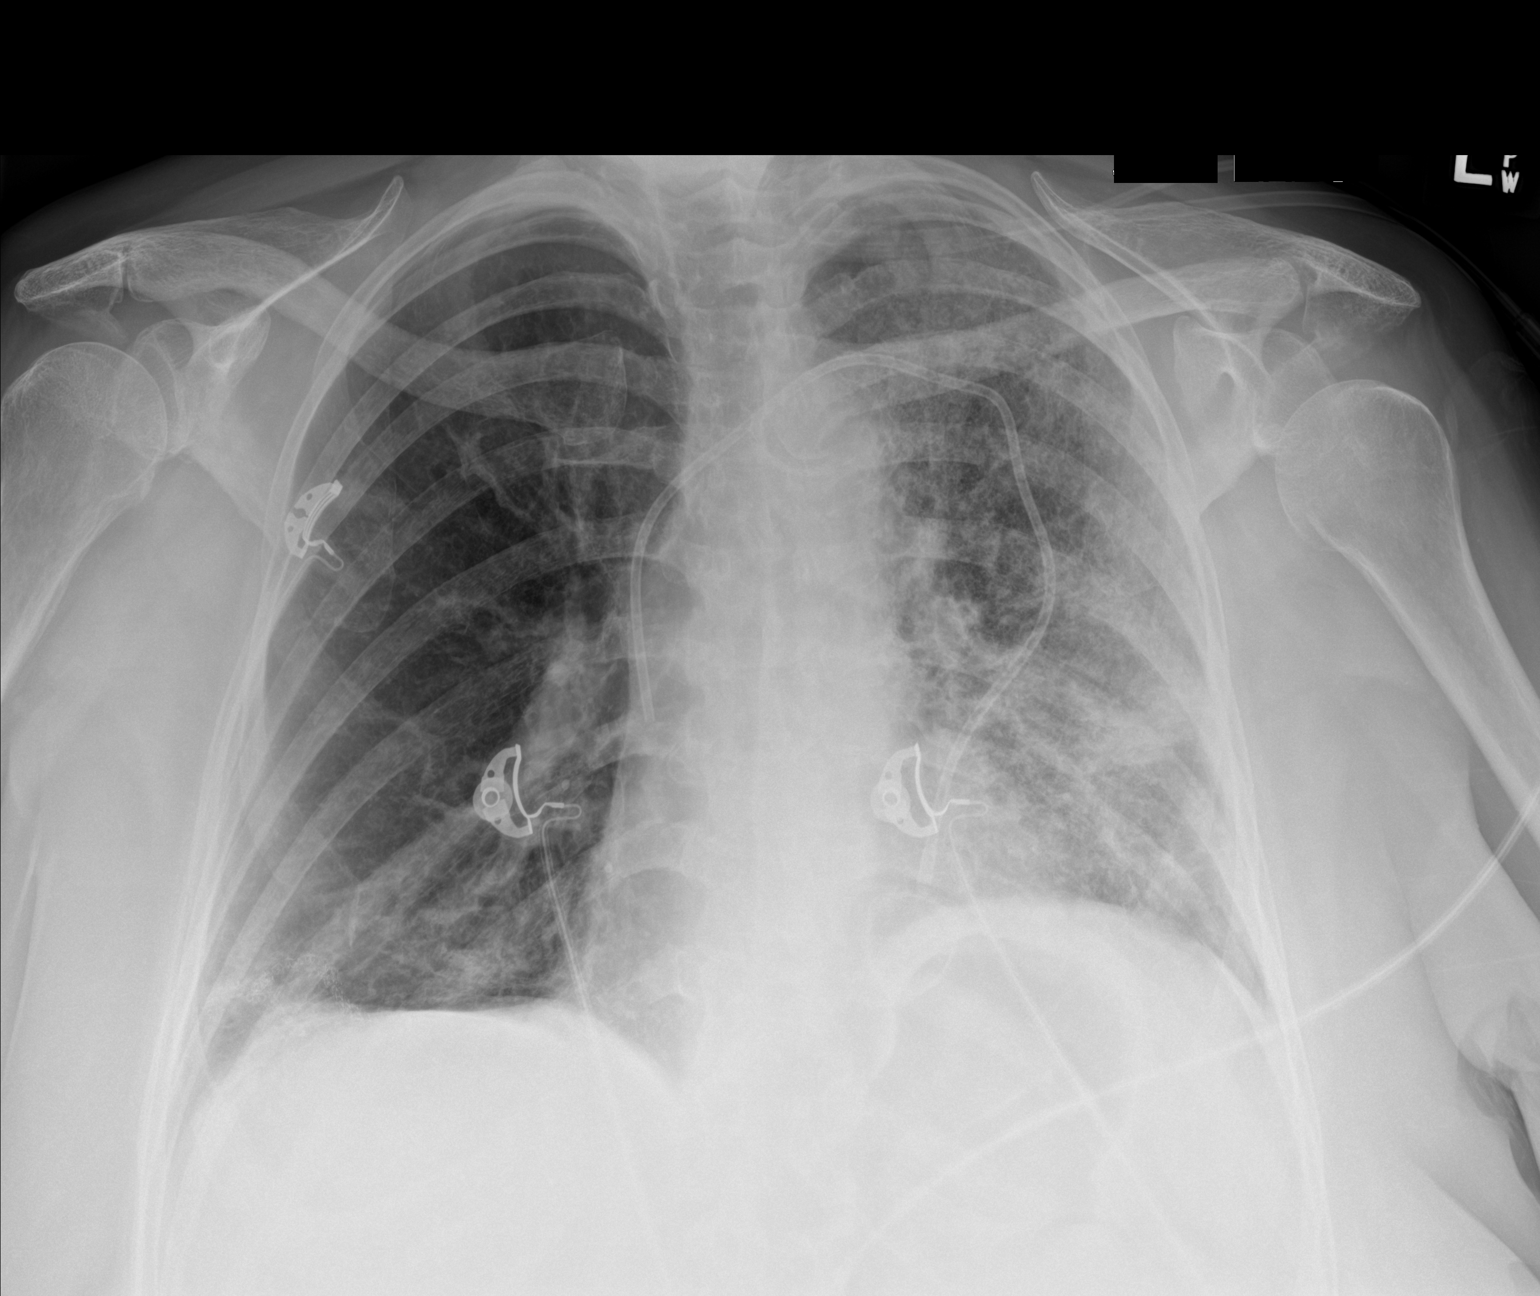

[1 of 1 positions shown; findings below may reference images not displayed]

FINDINGS: Injectable port in stable position.

Cardiomediastinal silhouette is normal. Mediastinal contours appear
intact.

Stable postsurgical changes in the right hemithorax with hyperlucent
appearance of the remaining lung.

Interval development of patchy airspace and interstitial opacities
throughout the left lung, in the lower portion of the right lung.

Osseous structures are without acute abnormality. Soft tissues are
grossly normal.
IMPRESSION: 1. Interval development of patchy airspace and interstitial
opacities throughout the left lung, and lower portion of the right
lung.
2. Stable postsurgical changes in the right hemithorax.

## 2020-01-17 IMAGING — DX DG CHEST 1V PORT
1 series · 1 of 1 positions shown · non-contrast
Comparison: Single-view of the chest 06/21/2019 and PA and lateral
chest 06/14/2019. CT chest 05/08/2019.

CLINICAL DATA: Acute respiratory distress secondary to [id number]
pneumonia.

EXAM:
PORTABLE CHEST 1 VIEW

[chest]
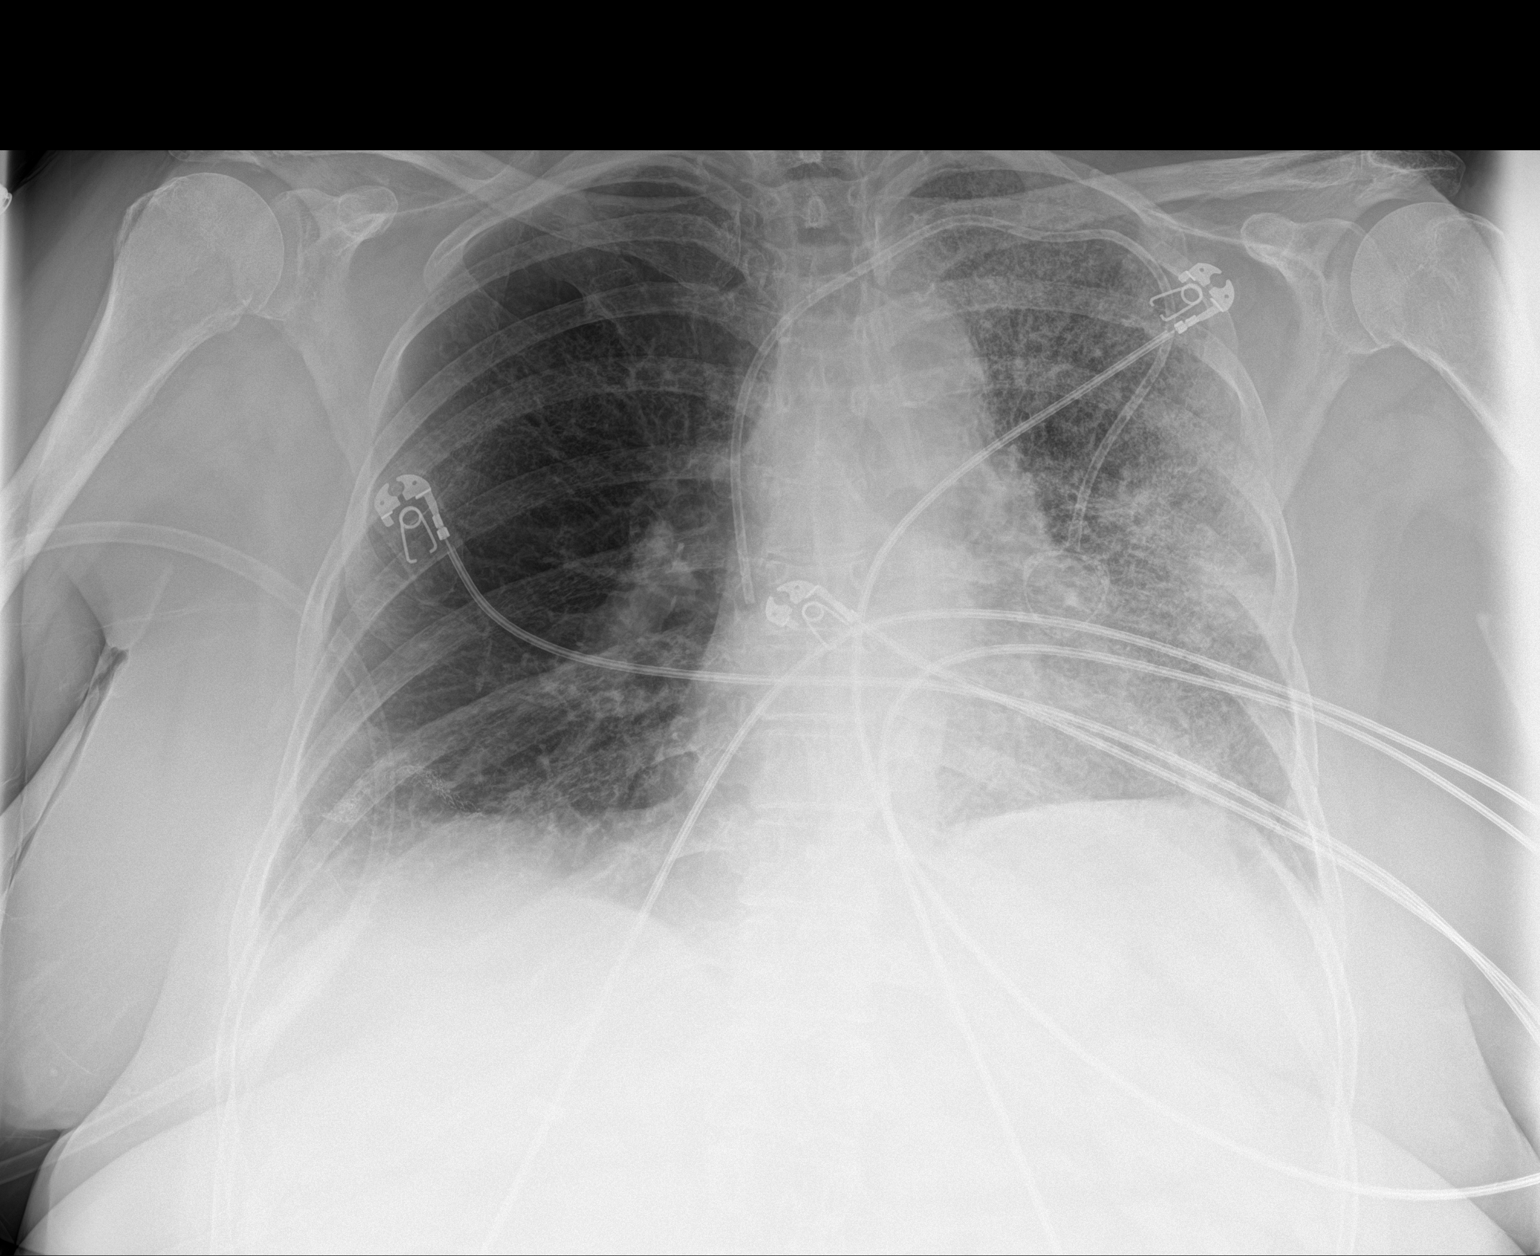

[1 of 1 positions shown; findings below may reference images not displayed]

FINDINGS: The lungs are emphysematous. Airspace disease throughout the left
chest appears unchanged. Heart size is normal. Mild scar is seen in
the right lung base where there is some suture material. No
pneumothorax or pleural fluid. Heart size is normal.
Atherosclerosis. Port-A-Cath noted.
IMPRESSION: No change in airspace disease throughout the left chest consistent
with pneumonia.

Emphysema.

## 2020-01-18 IMAGING — DX DG CHEST 1V PORT
1 series · 1 of 1 positions shown · non-contrast
Comparison: 06/24/2019

CLINICAL DATA: Hypoxemia

EXAM:
PORTABLE CHEST 1 VIEW

[chest ap]
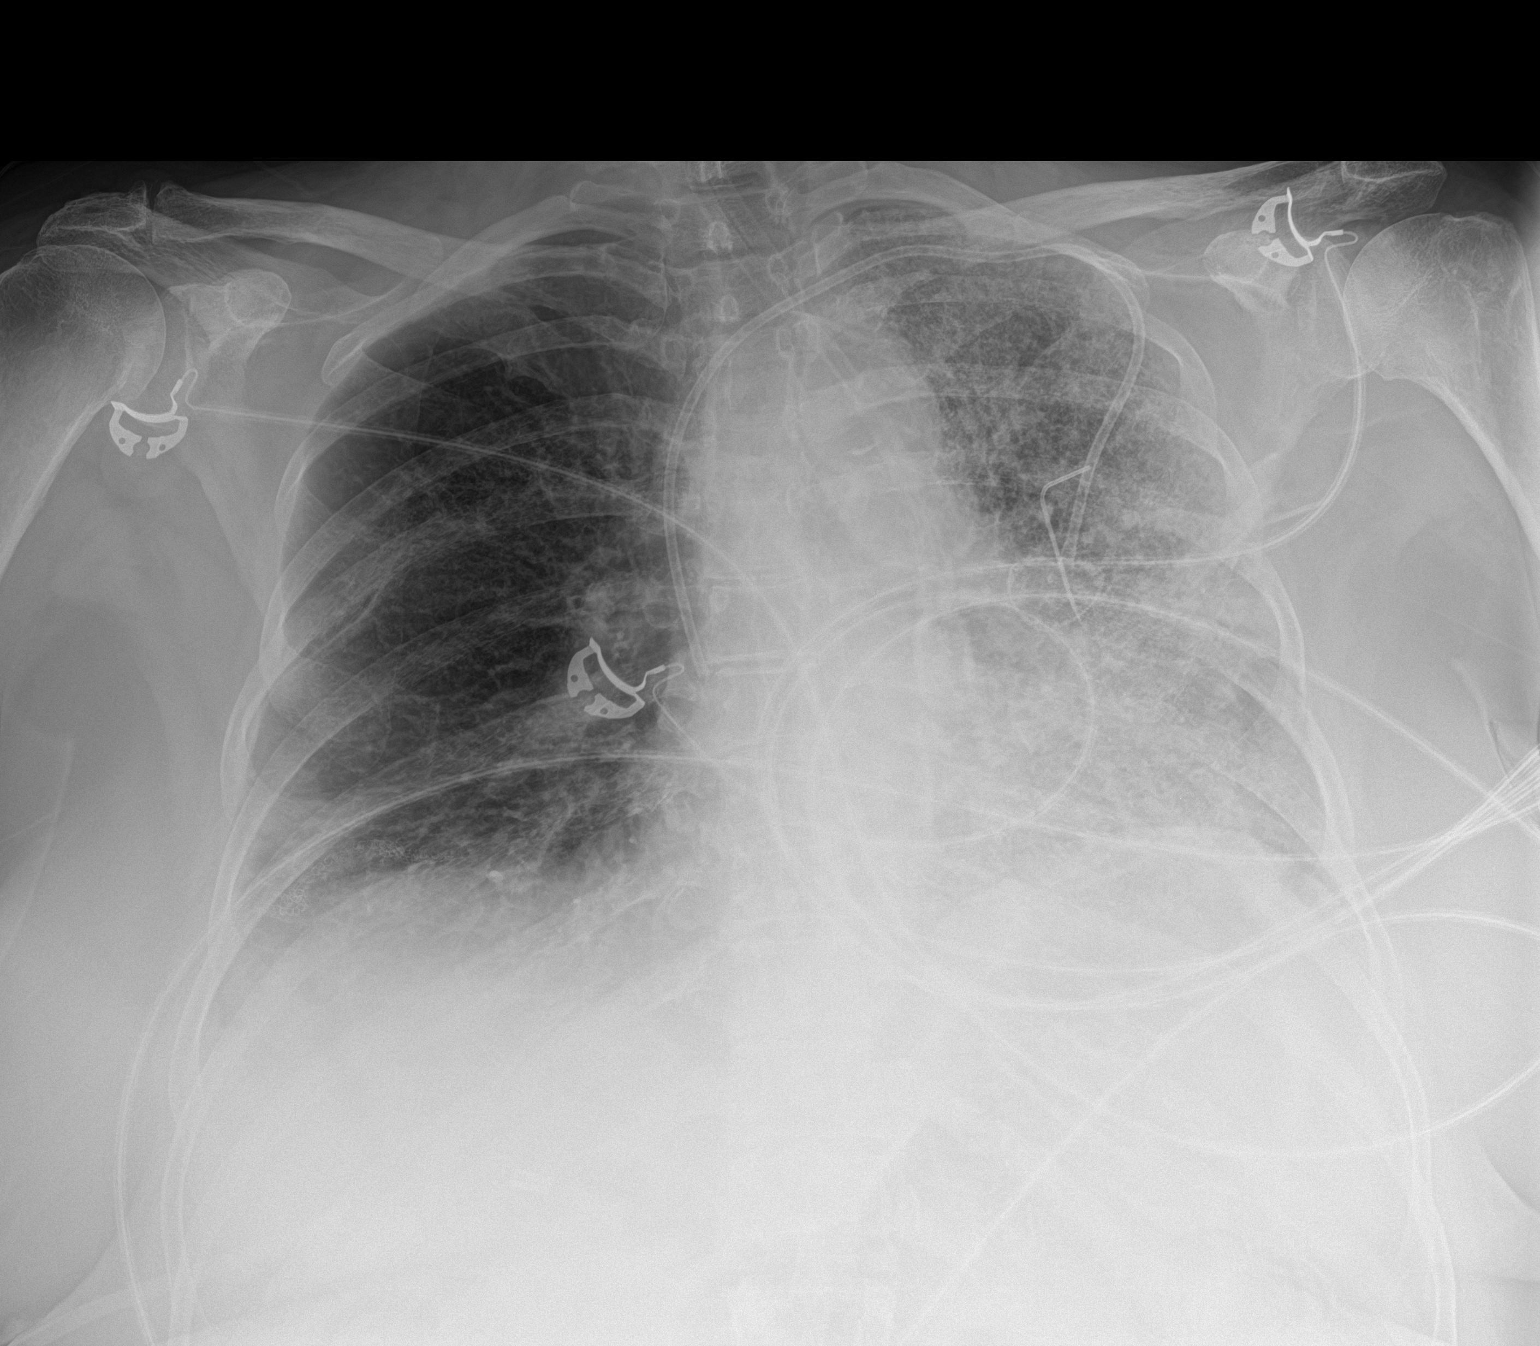

[1 of 1 positions shown; findings below may reference images not displayed]

FINDINGS: Left Port-A-Cath remains in place, unchanged. Diffuse airspace
disease throughout the left lung, worsened since prior study. Right
basilar opacities are stable. No acute bony abnormality. No
effusions.
IMPRESSION: Diffuse left lung airspace disease, slightly worsened since prior
study. Appearance is suggestive of pneumonia.

Right basilar scarring or infiltrate.

## 2020-01-20 IMAGING — DX DG CHEST 1V PORT
1 series · 1 of 1 positions shown · non-contrast
Comparison: Radiograph June 25, 2019.

CLINICAL DATA: Acute respiratory failure with hypoxia.

EXAM:
PORTABLE CHEST 1 VIEW

[chest ap]
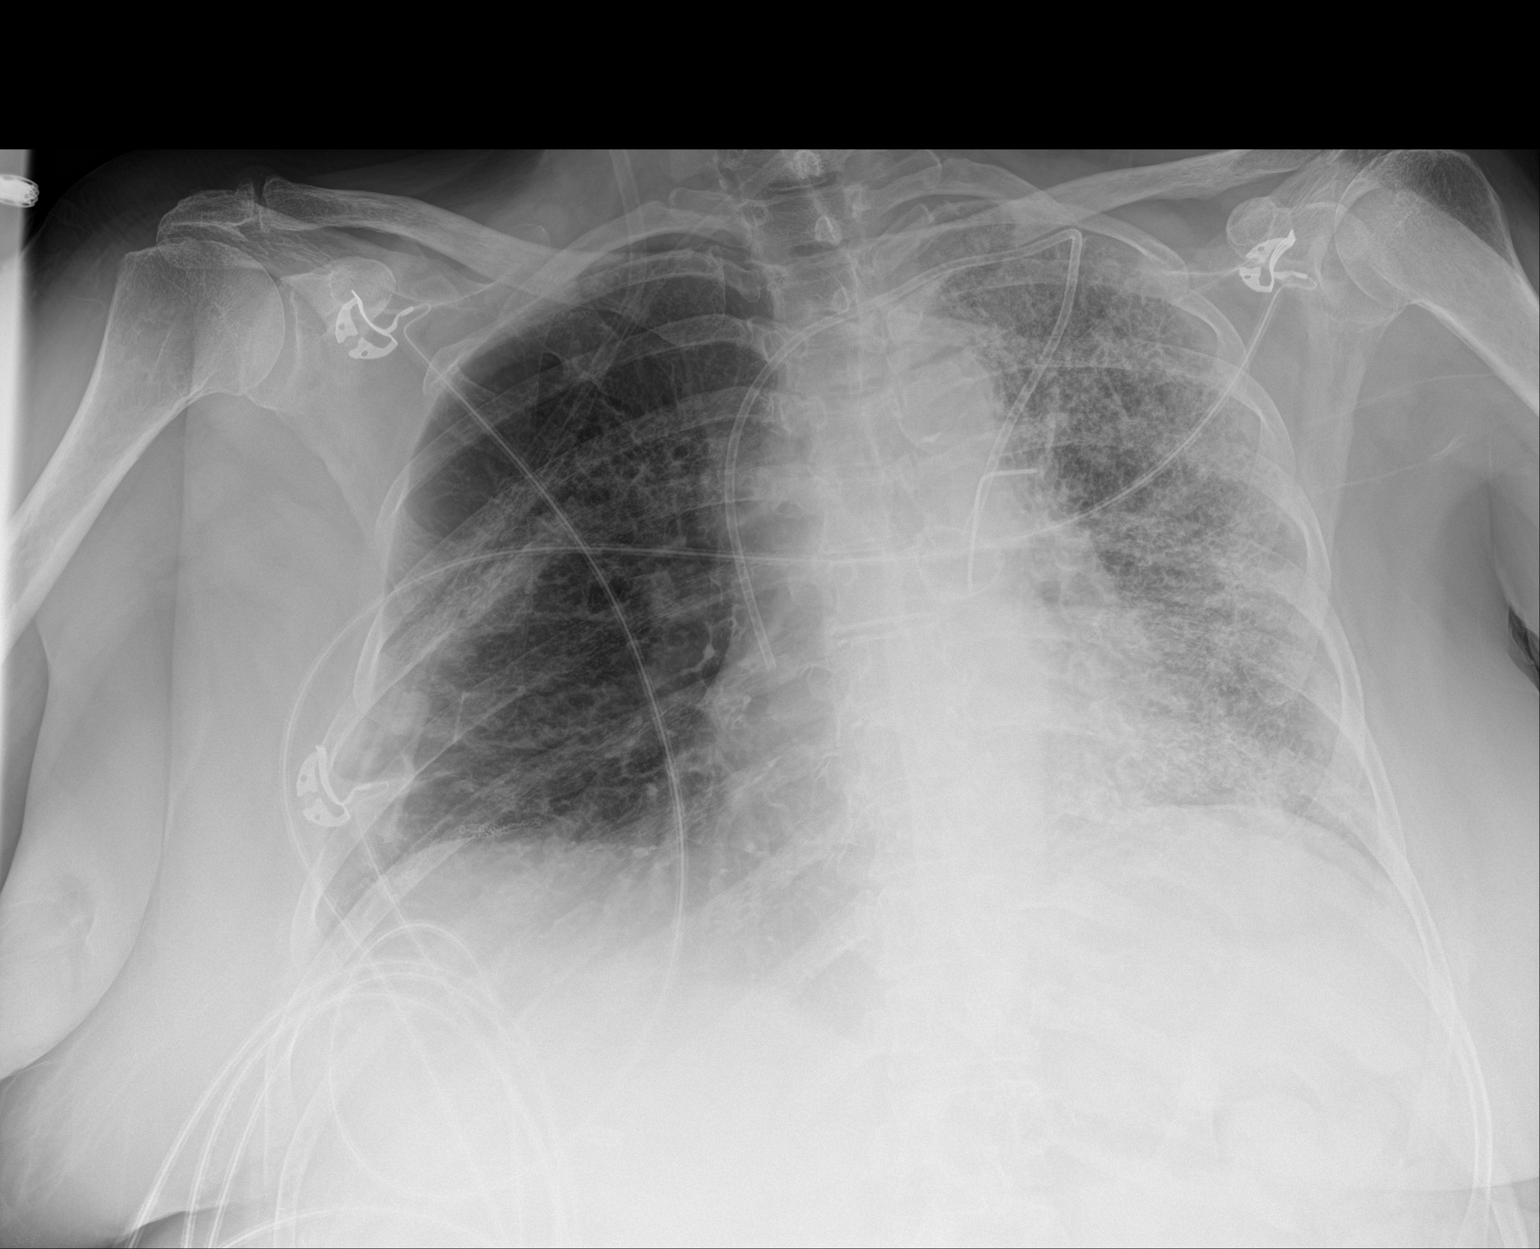

[1 of 1 positions shown; findings below may reference images not displayed]

FINDINGS: The heart size and mediastinal contours are within normal limits.
Left subclavian Port-A-Cath is unchanged in position. No
pneumothorax is noted. Stable diffuse left lung opacity is noted
concerning for pneumonia. Slightly increased right upper lobe
airspace opacity is noted. The visualized skeletal structures are
unremarkable.
IMPRESSION: Stable left lung opacity is noted concerning for pneumonia. Slightly
increased right upper lobe airspace opacity is noted.
